# Patient Record
Sex: Male | Born: 1935
Health system: Southern US, Community
[De-identification: ages and names within clinical notes are randomized; demographics above are authoritative.]

## PROBLEM LIST (undated history)

## (undated) DIAGNOSIS — K649 Unspecified hemorrhoids: Secondary | ICD-10-CM

## (undated) DIAGNOSIS — K219 Gastro-esophageal reflux disease without esophagitis: Secondary | ICD-10-CM

## (undated) DIAGNOSIS — K529 Noninfective gastroenteritis and colitis, unspecified: Secondary | ICD-10-CM

## (undated) DIAGNOSIS — I10 Essential (primary) hypertension: Secondary | ICD-10-CM

## (undated) DIAGNOSIS — K6289 Other specified diseases of anus and rectum: Secondary | ICD-10-CM

## (undated) DIAGNOSIS — E785 Hyperlipidemia, unspecified: Secondary | ICD-10-CM

## (undated) DIAGNOSIS — K635 Polyp of colon: Secondary | ICD-10-CM

## (undated) DIAGNOSIS — K5790 Diverticulosis of intestine, part unspecified, without perforation or abscess without bleeding: Secondary | ICD-10-CM

## (undated) DIAGNOSIS — M1990 Unspecified osteoarthritis, unspecified site: Secondary | ICD-10-CM

## (undated) DIAGNOSIS — J45909 Unspecified asthma, uncomplicated: Secondary | ICD-10-CM

## (undated) DIAGNOSIS — Z8719 Personal history of other diseases of the digestive system: Secondary | ICD-10-CM

## (undated) DIAGNOSIS — K922 Gastrointestinal hemorrhage, unspecified: Secondary | ICD-10-CM

## (undated) DIAGNOSIS — K222 Esophageal obstruction: Secondary | ICD-10-CM

## (undated) DIAGNOSIS — Z9889 Other specified postprocedural states: Secondary | ICD-10-CM

## (undated) DIAGNOSIS — I639 Cerebral infarction, unspecified: Secondary | ICD-10-CM

## (undated) DIAGNOSIS — E119 Type 2 diabetes mellitus without complications: Secondary | ICD-10-CM

## (undated) DIAGNOSIS — K449 Diaphragmatic hernia without obstruction or gangrene: Secondary | ICD-10-CM

## (undated) DIAGNOSIS — I63412 Cerebral infarction due to embolism of left middle cerebral artery: Secondary | ICD-10-CM

## (undated) DIAGNOSIS — I5042 Chronic combined systolic (congestive) and diastolic (congestive) heart failure: Secondary | ICD-10-CM

## (undated) HISTORY — PX: SPLENECTOMY, TOTAL: SHX788

## (undated) HISTORY — DX: Noninfective gastroenteritis and colitis, unspecified: K52.9

## (undated) HISTORY — DX: Hyperlipidemia, unspecified: E78.5

## (undated) HISTORY — DX: Gastrointestinal hemorrhage, unspecified: K92.2

## (undated) HISTORY — DX: Type 2 diabetes mellitus without complications: E11.9

## (undated) HISTORY — DX: Unspecified hemorrhoids: K64.9

## (undated) HISTORY — DX: Polyp of colon: K63.5

## (undated) HISTORY — DX: Other specified diseases of anus and rectum: K62.89

## (undated) HISTORY — DX: Personal history of other diseases of the digestive system: Z87.19

## (undated) HISTORY — PX: COLONOSCOPY: SHX174

## (undated) HISTORY — DX: Gastro-esophageal reflux disease without esophagitis: K21.9

## (undated) HISTORY — DX: Esophageal obstruction: K22.2

## (undated) HISTORY — PX: ESOPHAGOGASTRODUODENOSCOPY: SHX1529

## (undated) HISTORY — DX: Diaphragmatic hernia without obstruction or gangrene: K44.9

## (undated) HISTORY — DX: Unspecified asthma, uncomplicated: J45.909

## (undated) HISTORY — DX: Unspecified osteoarthritis, unspecified site: M19.90

## (undated) HISTORY — DX: Diverticulosis of intestine, part unspecified, without perforation or abscess without bleeding: K57.90

## (undated) HISTORY — DX: Cerebral infarction, unspecified: I63.9

## (undated) HISTORY — PX: ABDOMINAL HERNIA REPAIR: SHX539

## (undated) HISTORY — PX: NISSEN FUNDOPLICATION: SHX2091

## (undated) HISTORY — DX: Other specified postprocedural states: Z98.890

## (undated) NOTE — *Deleted (*Deleted)
Patient discharged home with wife, discharge instructions given and explained to patient/wife, they verbalized understanding, patient denies any pain/distress, no pressure injury noted.  accompanied home by wife.

---

## 2001-11-08 ENCOUNTER — Encounter: Admission: RE | Admit: 2001-11-08 | Discharge: 2001-11-08 | Payer: Self-pay | Admitting: Internal Medicine

## 2001-12-22 ENCOUNTER — Ambulatory Visit (HOSPITAL_COMMUNITY): Admission: RE | Admit: 2001-12-22 | Discharge: 2001-12-22 | Payer: Self-pay | Admitting: Gastroenterology

## 2001-12-22 ENCOUNTER — Encounter: Payer: Self-pay | Admitting: Gastroenterology

## 2002-03-03 ENCOUNTER — Ambulatory Visit: Admission: RE | Admit: 2002-03-03 | Discharge: 2002-03-03 | Payer: Self-pay | Admitting: General Surgery

## 2002-03-23 ENCOUNTER — Encounter (INDEPENDENT_AMBULATORY_CARE_PROVIDER_SITE_OTHER): Payer: Self-pay | Admitting: Specialist

## 2002-03-23 ENCOUNTER — Encounter: Payer: Self-pay | Admitting: Anesthesiology

## 2002-03-24 ENCOUNTER — Encounter: Payer: Self-pay | Admitting: General Surgery

## 2002-03-24 ENCOUNTER — Inpatient Hospital Stay (HOSPITAL_COMMUNITY): Admission: RE | Admit: 2002-03-24 | Discharge: 2002-03-26 | Payer: Self-pay | Admitting: General Surgery

## 2003-12-11 ENCOUNTER — Inpatient Hospital Stay (HOSPITAL_COMMUNITY): Admission: EM | Admit: 2003-12-11 | Discharge: 2003-12-12 | Payer: Self-pay | Admitting: Emergency Medicine

## 2003-12-19 ENCOUNTER — Ambulatory Visit: Payer: Self-pay | Admitting: Internal Medicine

## 2004-01-08 ENCOUNTER — Ambulatory Visit: Payer: Self-pay | Admitting: Internal Medicine

## 2004-11-11 ENCOUNTER — Encounter: Admission: RE | Admit: 2004-11-11 | Discharge: 2004-11-11 | Payer: Self-pay | Admitting: Otolaryngology

## 2005-01-05 ENCOUNTER — Encounter: Admission: RE | Admit: 2005-01-05 | Discharge: 2005-01-05 | Payer: Self-pay | Admitting: Otolaryngology

## 2005-06-19 ENCOUNTER — Ambulatory Visit: Payer: Self-pay | Admitting: Internal Medicine

## 2005-06-24 ENCOUNTER — Inpatient Hospital Stay (HOSPITAL_COMMUNITY): Admission: EM | Admit: 2005-06-24 | Discharge: 2005-06-26 | Payer: Self-pay | Admitting: Emergency Medicine

## 2005-06-24 ENCOUNTER — Ambulatory Visit: Payer: Self-pay | Admitting: Internal Medicine

## 2005-06-25 ENCOUNTER — Encounter (INDEPENDENT_AMBULATORY_CARE_PROVIDER_SITE_OTHER): Payer: Self-pay | Admitting: *Deleted

## 2005-06-30 ENCOUNTER — Ambulatory Visit: Payer: Self-pay | Admitting: Gastroenterology

## 2005-07-17 ENCOUNTER — Ambulatory Visit: Payer: Self-pay | Admitting: Internal Medicine

## 2005-07-23 ENCOUNTER — Encounter: Admission: RE | Admit: 2005-07-23 | Discharge: 2005-07-23 | Payer: Self-pay | Admitting: Otolaryngology

## 2005-07-27 ENCOUNTER — Ambulatory Visit: Payer: Self-pay | Admitting: Internal Medicine

## 2005-08-31 ENCOUNTER — Ambulatory Visit: Payer: Self-pay | Admitting: Internal Medicine

## 2005-09-16 ENCOUNTER — Ambulatory Visit: Payer: Self-pay | Admitting: Internal Medicine

## 2006-01-21 ENCOUNTER — Encounter: Payer: Self-pay | Admitting: Vascular Surgery

## 2006-01-21 ENCOUNTER — Inpatient Hospital Stay (HOSPITAL_COMMUNITY): Admission: EM | Admit: 2006-01-21 | Discharge: 2006-01-22 | Payer: Self-pay | Admitting: Emergency Medicine

## 2006-02-01 ENCOUNTER — Ambulatory Visit (HOSPITAL_COMMUNITY): Admission: RE | Admit: 2006-02-01 | Discharge: 2006-02-01 | Payer: Self-pay | Admitting: Interventional Cardiology

## 2006-02-01 ENCOUNTER — Encounter (INDEPENDENT_AMBULATORY_CARE_PROVIDER_SITE_OTHER): Payer: Self-pay | Admitting: Interventional Cardiology

## 2006-07-19 ENCOUNTER — Encounter: Admission: RE | Admit: 2006-07-19 | Discharge: 2006-07-19 | Payer: Self-pay | Admitting: Otolaryngology

## 2006-09-21 ENCOUNTER — Ambulatory Visit (HOSPITAL_BASED_OUTPATIENT_CLINIC_OR_DEPARTMENT_OTHER): Admission: RE | Admit: 2006-09-21 | Discharge: 2006-09-21 | Payer: Self-pay | Admitting: Otolaryngology

## 2006-09-21 ENCOUNTER — Encounter (INDEPENDENT_AMBULATORY_CARE_PROVIDER_SITE_OTHER): Payer: Self-pay | Admitting: Otolaryngology

## 2006-10-12 ENCOUNTER — Ambulatory Visit (HOSPITAL_COMMUNITY): Admission: RE | Admit: 2006-10-12 | Discharge: 2006-10-12 | Payer: Self-pay | Admitting: Internal Medicine

## 2006-12-08 ENCOUNTER — Encounter: Admission: RE | Admit: 2006-12-08 | Discharge: 2006-12-08 | Payer: Self-pay | Admitting: Internal Medicine

## 2007-01-20 ENCOUNTER — Ambulatory Visit (HOSPITAL_COMMUNITY): Admission: RE | Admit: 2007-01-20 | Discharge: 2007-01-22 | Payer: Self-pay | Admitting: General Surgery

## 2007-06-24 ENCOUNTER — Encounter: Payer: Self-pay | Admitting: Gastroenterology

## 2007-06-24 ENCOUNTER — Ambulatory Visit: Payer: Self-pay | Admitting: Gastroenterology

## 2007-06-24 DIAGNOSIS — Z860100 Personal history of colon polyps, unspecified: Secondary | ICD-10-CM | POA: Insufficient documentation

## 2007-06-24 DIAGNOSIS — K5731 Diverticulosis of large intestine without perforation or abscess with bleeding: Secondary | ICD-10-CM | POA: Insufficient documentation

## 2007-06-24 DIAGNOSIS — Z8679 Personal history of other diseases of the circulatory system: Secondary | ICD-10-CM | POA: Insufficient documentation

## 2007-06-24 DIAGNOSIS — E785 Hyperlipidemia, unspecified: Secondary | ICD-10-CM | POA: Insufficient documentation

## 2007-08-08 ENCOUNTER — Ambulatory Visit: Payer: Self-pay | Admitting: Gastroenterology

## 2007-10-07 ENCOUNTER — Encounter: Admission: RE | Admit: 2007-10-07 | Discharge: 2007-10-07 | Payer: Self-pay | Admitting: General Surgery

## 2007-10-11 ENCOUNTER — Encounter: Payer: Self-pay | Admitting: Gastroenterology

## 2007-10-11 ENCOUNTER — Telehealth: Payer: Self-pay | Admitting: Gastroenterology

## 2007-10-11 ENCOUNTER — Encounter (INDEPENDENT_AMBULATORY_CARE_PROVIDER_SITE_OTHER): Payer: Self-pay | Admitting: *Deleted

## 2007-10-11 DIAGNOSIS — K222 Esophageal obstruction: Secondary | ICD-10-CM | POA: Insufficient documentation

## 2007-11-03 ENCOUNTER — Encounter: Payer: Self-pay | Admitting: Gastroenterology

## 2007-11-03 ENCOUNTER — Ambulatory Visit (HOSPITAL_COMMUNITY): Admission: RE | Admit: 2007-11-03 | Discharge: 2007-11-03 | Payer: Self-pay | Admitting: Gastroenterology

## 2010-06-26 ENCOUNTER — Other Ambulatory Visit: Payer: Self-pay | Admitting: Dermatology

## 2010-07-01 NOTE — Op Note (Signed)
NAMELIONELL, MATUSZAK                 ACCOUNT NO.:  [id number]   MEDICAL RECORD NO.:  [id number]          PATIENT TYPE:  AMB   LOCATION:  DSC                          FACILITY:  [facility]   PHYSICIAN:  Christopher E. Ezzard Standing, M.D.DATE OF BIRTH:  1931/08/20   DATE OF PROCEDURE:  09/21/2006  DATE OF DISCHARGE:                               OPERATIVE REPORT   PREOPERATIVE DIAGNOSIS:  Chronic sinonasal disease with sinonasal  polyps.   POSTOPERATIVE DIAGNOSES:  1. Chronic sinonasal disease with sinonasal polyps.  2. Left inferior turbinate hypertrophy.   OPERATION PERFORMED:  1. Functional endoscopic sinus surgery with bilateral total      ethmoidectomy with removal of sinonasal polyps, bilateral maxillary      ostial enlargement.  2. Left inferior turbinate reduction.   SURGEON:  Kristine Garbe. Ezzard Standing, M.D.   ANESTHESIA:  General endotracheal.   COMPLICATIONS:  None.   BRIEF CLINICAL NOTE:  [name] is a 77 year old gentleman who has had  problem with a chronic cough.  He had chronic sinus disease identified  on the MRI scan as well as CT scan, despite several rounds of  antibiotics.  On exam in the office, he has edematous mucosa and some  very small early polyps in the middle meatus noted.  His main symptoms  of sinus problems is some nasal congestion as well as a chronic cough  and postnasal drainage.  He gets temporary relief when he is on  antibiotics and steroids, but keeps having recurrent problems and repeat  CT scan shows a chronic blockage of the sinuses.  He is taken to the  operating room at this time for functional endoscopic sinus surgery.   DESCRIPTION OF PROCEDURE:  After adequate endotracheal anesthesia, the  nose was prepped with cotton pledgets soaked in decongestant and the  middle meatus area was injected with Xylocaine with epinephrine for  local hemostasis control.  Of note, the patient had a very large left  inferior turbinate.  At completion of the sinus  procedure, a left  inferior turbinate reduction was performed.  First the right side was  approached.  On exam with the 0 endoscope, [name] had several polyps  within the middle meatus as well as in the posterior ethmoid area.  The  uncinate process was incised with a sickle knife.  The anterior and  posterior ethmoid cells were opened up with straight-through cut and up-  through cup forceps as well as use of the microdebrider to remove some  of the thickened polypoid mucosa.  Next, the maxillary ostia was  identified and this was enlarged with straight-through cup and bite-  cutting forceps.  There was some hypertrophy and early polypoid changes  of the mucosa within the maxillary sinus but minimal disease in the  right maxillary sinus.  After opening up the anterior and posterior  ethmoid areas, the right side was completed.  Packs were placed for  hemostasis and then the left side was approached.  Again, using the 0  and 33 endoscopes, the nose and sinus area was evaluated.  There were  several polyps arising  from the middle meatus which were small.  The  uncinate process was incised with a sickle knife, anterior and posterior  ethmoid cells were opened up with straight-through cup forceps and a  microdebrider.  The maxillary ostia was identified and enlarged with  backbiting and straight-through cup forceps.  Of note, on the left  maxillary sinus, [name] had a large amount of very thick glue-like mucus  in the left maxillary sinus which was cleaned and aspirated out.  This  completed the left side.  Because of the size of the left inferior  turbinate, left inferior turbinate reduction was performed.  An incision  was made along the inferior edge of the turbinate, mucosa was elevated  off the turbinate bone and turbinate bone was removed submucosal with  cauterization of the turbinate was performed and the remaining turbinate  was outfractured.  This completed the procedure.  Kennedy  sinus packs  were placed within the middle meatus bilaterally and hydrated with  Xylocaine with epinephrine.  The turbinates were then injected with 30  mg of Depo-Medrol bilaterally.  This completed the procedure.  [name]  was awoke from anesthesia and transferred to the recovery room postop  doing well.   DISPOSITION:  [name] will be discharged home later this morning on  Keflex 500 mg b.i.d. for 10 days and Tylenol and Vicodin p.r.n. pain.  He will restart his aspirin and Plavix in two days will follow up in my  office in three days to have his [name] sinus packs removed.           ______________________________  Kristine Garbe Ezzard Standing, M.D.     CEN/MEDQ  D:  09/21/2006  T:  09/21/2006  Job:  [id number]   cc:   Theressa Millard, M.D.

## 2010-07-01 NOTE — Op Note (Signed)
NAMETYREK, LAWHORN                 ACCOUNT NO.:  [id number]   MEDICAL RECORD NO.:  [id number]          PATIENT TYPE:  OIB   LOCATION:  2550                         FACILITY:  [facility]   PHYSICIAN:  Cherylynn Ridges, M.D.    DATE OF BIRTH:  1931/12/20   DATE OF PROCEDURE:  01/20/2007  DATE OF DISCHARGE:                               OPERATIVE REPORT   PREOPERATIVE DIAGNOSIS:  Ventral hernia status post splenectomy.   POSTOPERATIVE DIAGNOSIS:  Ventral hernia status post splenectomy.   PROCEDURE:  Laparoscopic ventral hernia repair with 20 x 15 piece of  Proceed mesh.   SURGEON:  Cherylynn Ridges, M.D.   ASSISTANT:  Ollen Gross. Vernell Morgans, M.D.   ANESTHESIA:  General endotracheal.   ESTIMATED BLOOD LOSS:  Less than 20 mL.   COMPLICATIONS:  None.   CONDITION:  Stable.   INDICATION FOR OPERATION:  The patient is a 77 year old who underwent  splenectomy and exploratory laparotomy who developed a ventral hernia,  who now comes in for repair.   FINDINGS:  The patient had omental and small bowel adhesions to the  midline ventral hernia in the upper portion of his previous incision.  This was easily dissected away and there was no evidence of any bowel  entrapment.   OPERATION:  The patient was taken to the operating room, placed on the  table in the supine position.  After an adequate general endotracheal  anesthetic was administered, he was prepped and draped in the usual  sterile manner exposing the midline of the abdomen.   A Hasson cannula was inserted through the left lower quadrant of the  patient's abdomen using [employer] retractors, making incisions in the  rectus sheath and dissecting down to the peritoneum.  A pursestring  suture of 0 Vicryl was placed and the Hasson cannula was passed.  We  were able to insufflate carbon dioxide gas through the cannula into the  peritoneal cavity exposing the large midline ventral hernia which had  part omentum and small bowel contents entrapped  within.   A right upper quadrant 10/11-mm cannula and a right lower quadrant 5-mm  cannula and in the left upper quadrant a 5-mm cannula were all passed  under direct vision.  We then spent time using various instruments  including [place] retractors, laparoscopic scissors and cautery and  also Endoclips to dissect out the contents of the hernia defect.  This  carefully with clips being applied liberally to the omentum in order to  avoid bleeding as the patient is on Plavix chronically because of  chronic recurrent TIAs.  We were able to successfully find the edges of  the hernia sac and then subsequently mark these edges and measured out 5  cm from that, measuring about 15 cm circumferentially around it.  We  decided to use a piece of 15 x 20 cm Proceed mesh, which was soaked in  antibiotic solution.  Tacking sutures or stay sutures were placed on the  mesh with the rough side superiorly and the mesh marked for appropriate  orientation.  We were able to pass  it through the cannula in the right  upper quadrant and then subsequently, retrieve the stay sutures using a  suture retriever through four incisions made around the hernia defect.  Once we pulled these sutures up, we tied them down.  This approximated  the mesh up towards the anterior abdominal wall.  There was a small  amount of laxity in the mesh itself, however, it appeared to be somewhat  taut.  There was gas protruding out through the hernia itself but this  is not surprising.  We subsequently tacked it down with tacking sutures  circumferentially making sure that all edges were covered.  This was  done appropriately and once we had it done, we obtained hemostasis using  clips and electrocautery.  There was minimal bleeding.  There was still  some protrusion of the abdominal wall because of gas in the sac,  however, we had the entire hernia defect itself covered with mesh under  appropriate tension.  All counts were correct.   We aspirated all fluid  and gas from above the liver as we did irrigate somewhat and then we  removed all cannulas.   The left lower quadrant and right upper quadrant incision sites were  closed using running subcuticular stitch of 4-0 Vicryl.  The fascia in  the left lower quadrant was closed using the stay suture and also a  running 0 Vicryl.  Marcaine 0.25% was injected at all sites.  We closed  most of the cannula sites and small sutures sites using Dermabond and  then we placed Dermabond upon the repaired sites with a 4-0 Vicryl.  Sterile dressings were applied including Tegaderm and Steri-Strips.      Cherylynn Ridges, M.D.  Electronically Signed     JOW/MEDQ  D:  01/20/2007  T:  01/20/2007  Job:  [id number]   cc:   Theressa Millard, M.D.

## 2010-07-04 NOTE — H&P (Signed)
NAMEKACIN, DANCY NO.:  [id number]   MEDICAL RECORD NO.:  [id number]          PATIENT TYPE:  INP   LOCATION:  2006                         FACILITY:  [facility]   PHYSICIAN:  Elmore Guise., M.D.DATE OF BIRTH:  10-15-1931   DATE OF ADMISSION:  12/11/2003  DATE OF DISCHARGE:                                HISTORY & PHYSICAL   PRIMARY CARE PHYSICIAN:  Dr. Janey Greaser.   REASON FOR ADMISSION:  Chest pain.   HISTORY OF PRESENT ILLNESS:  The patient is a very pleasant 77 year old  white male with past medical history of dyslipidemia who presents to the ER  with a chief complaint of chest tightness. The patient reports normal state  of health until approximately two days ago. Since that time he reported  increasing nonproductive cough as well as intermittent chest pain. The  patient reports it is in the substernal area, occasional pressure feeling,  no exertional component. The patient does still remain active. He can walk  miles at a time with no significant limitations. Denies any prior chest  pressure or pain. No palpitations. No dyspnea on exertion. No orthopnea, no  PND. No significant lower extremity edema.   REVIEW OF SYSTEMS:  Positive for head cold for the last six weeks and  recent nonproductive cough. The patient does report pain as positional and  that it worsens when he lies flat and improves on leaning forward.   CURRENT MEDICATIONS:  Zocor 40 mg a day and an aspirin.   ALLERGIES:  None.   FAMILY HISTORY:  No significant coronary artery disease.   SOCIAL HISTORY:  He is married.  No tobacco. Occasional alcohol with a glass  of either daily or every other day.   PHYSICAL EXAMINATION:  VITAL SIGNS: Blood pressure 129/65, pulse 80 to 90,  he is afebrile with a temperature of 98.3. He is saturating at 95% on room  air.  GENERAL: He is alert and oriented times four and in no acute distress.  NECK: Supple with no lymphadenopathy. 2+ carotids. No  JVD.  LUNGS: Clear.  HEART: Regular with a normal S1 and S2. A very soft 2/6 systolic ejection  murmur at the left lower sternal border.  ABDOMEN: Soft, nontender, and nondistended.  EXTREMITIES: Warm with 2+ pulses and no edema.   Lab work shows white count of 13.4, hemoglobin 16.2, platelet count 194,000.  He has got 86% neutrophils, 6% lymphs, some mild left shift.  Coags are  normal with a PT of 13.1, INR 1.0, and PTT of 29.  BUN and creatinine are 11  and 0.9.  Electrolytes within normal limits. LFTs all normal.  CK is 52, MB  is 1.3, troponin-I is negative.   Chest x-ray shows no acute cardiopulmonary disease. ECG shows normal sinus  rhythm, no significant ST-T wave changes noted.   IMPRESSION:  1.  Chest pain.  2.  History of dyslipidemia.   PLAN:  1.  From a chest pain standpoint, will admit the patient for observation.      Will continue serial cardiac enzymes. Will give patient one  shot of      Lovenox today.  Continue aspirin daily. I have encouraged him to get up      and walk around.  Should he not have any further exertional chest pain,      will likely discharge in the morning with outpatient stress test.      However, ECG is unremarkable for pericarditis. I will check an echo to      evaluate LV function as well as for any significant pericardial      effusion.  2.  Patient with mildly elevated white count. Suspect this is secondary to      recent upper respiratory tract infection with possible sinusitis. Will      hold on antibiotics unless the patient becomes febrile.       TWK/MEDQ  D:  12/11/2003  T:  12/11/2003  Job:  [id number]

## 2010-07-04 NOTE — Assessment & Plan Note (Signed)
[facility]                               PULMONARY OFFICE NOTE   Grant, [name]                        MRN:          [id number]  DATE:09/16/2005                            DOB:          14-Oct-1931    HISTORY OF PRESENT ILLNESS:  A 76 year old, white male, never a smoker with  PFTs performed on a bad day on August 31, 2005, which were completely normal  suggesting to me an upper airway problem and has apparently responded to  treatment with regular reflux and rhinitis and comes back here for further  recommendations.  Note, he is doing much better since Advair was stopped.   PHYSICAL EXAMINATION:  GENERAL:  He is a pleasant, ambulatory, white male in  no acute distress.  VITAL SIGNS:  Stable.  HEENT:  Unremarkable.  Pharynx clear.  LUNGS:  Perfectly clear bilaterally to auscultation and percussion.  HEART:  Regular rate and rhythm without murmurs, rubs or gallops.  ABDOMEN:  Soft, benign.  EXTREMITIES:  Without calf tenderness, cyanosis or clubbing.   IMPRESSION:  Complete reverse of all the symptoms with treatment directed at  rhinitis and reflux.  The issue is whether there is any asthma present at  all or whether he had pseudoasthma.  To sort through this, I recommended  trying to stop the QVAR first and then after a week or two, he could reduce  proton pump inhibitor therapy down to one daily (the reverse of a  therapeutic trial, if you will).   PLAN:  Pulmonary followup can be p.r.n.  The patient should maintain  treatment directed at rhinitis based on CT scan and follow up with Dr. Narda Bonds for treatment of sinusitis.  However, pulmonary followup is p.r.n.                                   Charlaine Dalton. Sherene Sires, MD, [facility]   MBW/MedQ  DD:  09/17/2005  DT:  09/17/2005  Job #:  [id number]   cc:   Al Decant. Janey Greaser, MD  Kristine Garbe Ezzard Standing, MD

## 2010-07-04 NOTE — Discharge Summary (Signed)
   NAME:  [name], [name]                           ACCOUNT NO.:  [id number]   MEDICAL RECORD NO.:  [id number]                   PATIENT TYPE:  INP   LOCATION:  0349                                 FACILITY:  [facility]   PHYSICIAN:  Adolph Pollack, M.D.            DATE OF BIRTH:  September 16, 1931   DATE OF ADMISSION:  03/23/2002  DATE OF DISCHARGE:  03/26/2002                                 DISCHARGE SUMMARY   PRINCIPAL DISCHARGE DIAGNOSIS:  Paraesophageal hernia.   SECONDARY DIAGNOSES:  1. Gastroesophageal reflux.  2. Hypercholesterolemia.  3. Arthritis.   PROCEDURE:  Laparoscopic paraesophageal hernia repair and Nissen  fundoplication 03/23/02.   REASON FOR ADMISSION:  Mr. Seidman is a 77 year old male with dysphagia, who  was found to have a paraesophageal.  He also had some gastroesophageal  reflux.  He was admitted electively for laparoscopic repair.   HOSPITAL COURSE:  He underwent the above procedure.  The paraesophageal  hernia sac was stuck to the left pleura, so there was a small left pleural  hole, and he had a small left pneumothorax that essentially resolved without  further intervention.  He had a Gastrografin swallow on his first  postoperative day that showed no leak, and a diet was started.  He began  tolerating his diet, was afebrile, and by the third postoperative day, he  was ready to be discharged.   DISPOSITION:  Discharged to home, 03/26/02.  He was given discharge  instructions and told to have liquids and blenderized foods only and not to  overeat.   ACTIVITY:  1. No lifting over 5 pounds.  2. No driving for five days.    DISCHARGE MEDICATIONS:  1. He was given a prescription for Tylox for pain.  2. He was told to stop his Prilosec but to continue on his other home     medicines and take Gas-X for bloating.   FOLLOW UP:  He will see me back in the office in 2-3 weeks.                                               Adolph Pollack, M.D.    Kari Baars  D:  04/06/2002  T:  04/06/2002  Job:  [id number]   cc:   Al Decant. Janey Greaser, M.D.  [address]  [place]  [place] [address]  Fax: [phone]

## 2010-07-04 NOTE — Discharge Summary (Signed)
NAMELUCY, WOOLEVER NO.:  [id number]   MEDICAL RECORD NO.:  [id number]          PATIENT TYPE:  INP   LOCATION:  2006                         FACILITY:  [facility]   PHYSICIAN:  Elmore Guise., M.D.DATE OF BIRTH:  [birth year]   DATE OF ADMISSION:  12/11/2003  DATE OF DISCHARGE:  12/12/2003                                 DISCHARGE SUMMARY   DISCHARGE DIAGNOSES:  1.  Pericarditic chest pain.  2.  History of dyslipidemia.   HISTORY OF PRESENT ILLNESS:  The patient is a very pleasant 77 year old  white male who presented to his primary care physician's office complaining  of increasing retrosternal chest pain.  The patient was sent to the [facility] emergency department for further evaluation.   HOSPITAL COURSE:  On arrival to [facility] ER, the patient was promptly  seen.  He reports increasing chest pain, primarily substernal, worse with  lying flat, and improved with sitting forward and positional changes.  He  was given sublingual nitroglycerin with no significant changes.  His chest  pain resolved spontaneously.  Initial cardiac enzymes were negative.   The patient was admitted for observation. He continued to be chest pain free  for approximately four hours.  He had recurrence of his chest pain which  totally resolved after intravenous Toradol.  His enzymes remained negative  with troponins of 0.01 x3.  ECGs remained negative.  He did have significant  improvement with nonsteroidals.  His blood work on discharge showed a  cholesterol level of 185, triglycerides of 241, HDL of 44, LDL of 93, and  VLDL of 48.   He will be discharged home today to continue the following medications:  1.  Zocor 40 mg daily.  2.  Aspirin 81 mg daily.  3.  Ibuprofen or Advil 400 to 600 mg q.8h. for three days, and then on an as-      needed basis.   PAIN MANAGEMENT:  As listed above.   DISCHARGE ACTIVITY:  As tolerated.   DISCHARGE DIET:  Low fat, low cholesterol.   FOLLOWUP:  At [facility] in two to three days for routine  outpatient stress test.  The patient will be notified from the office for  scheduling.  I have asked him to notify me directly should he have any  further chest pain.  However, he has now been ambulatory with no exertional  component and with his symptoms being atypical, we have decided that  outpatient followup is best.       TWK/MEDQ  D:  12/12/2003  T:  12/12/2003  Job:  [id number]

## 2010-07-04 NOTE — H&P (Signed)
NAMEJOVAN, [name] NO.:  [id number]   MEDICAL RECORD NO.:  [id number]          PATIENT TYPE:  INP   LOCATION:  4710                         FACILITY:  [facility]   PHYSICIAN:  Theressa Millard, M.D.    DATE OF BIRTH:  11-10-1931   DATE OF ADMISSION:  01/21/2006  DATE OF DISCHARGE:  01/22/2006                              HISTORY & PHYSICAL   HISTORY OF PRESENT ILLNESS:  [name] is a very nice 77 year old white  male admitted with TIA versus stroke.   He awoke this morning with slurred speech with a clumsy right hand and a  right facial droop.  He may have had a little bit of numbness in the  hand.  He had no leg symptoms.  He came to the emergency department and  over the last several hours, all his symptoms have resolved.  A CT scan  of the head has already been done and is negative.  Of note is the fact  that he has had a URI in the past 24 hours with a temp to 99.2, some  hoarseness and sore throat.  He is taking some over-the-counter cold  remedies.   PAST MEDICAL HISTORY:  Surgical:  Nissan fundoplication, splenectomy for  ruptured spleen.   Medical:  Tremor and in the office last week, noted to have some  cogwheeling.  Low HDL cholesterol, borderline blood sugar.   MEDICATIONS:  1. Seroquel 40 mg daily.  2. Aspirin 81 mg occasionally.   ALLERGIES:  NO KNOWN DRUG ALLERGIES.   SOCIAL HISTORY:  He is retired from [employer]  He does not smoke.  He drinks one to two glasses of wine daily.   FAMILY HISTORY:  Noncontributory.   REVIEW OF SYSTEMS:  All other systems are negative.   PHYSICAL EXAMINATION:  GENERAL:  Well-developed, well-nourished, in no  acute distress.  VITAL SIGNS:  Blood pressure 142/75.  Pulse of 98.  Respiratory of 20.  O2 saturation 98%.  EYES:  Have pupils which are round and reactive to light.  Extraocular  movements are intact.  EARS, NOSE AND THROAT:  Are unremarkable.  NECK:  Supple.  Thyroid is not enlarged or tender.  CHEST:  Clear to auscultation and percussion.  CARDIAC EXAM:  Has a normal S1 and S2.  There is no S3, S4, murmur, rub  or click.  ABDOMEN:  Soft and nontender.  NEUROLOGICALLY:  Alert and oriented times three.  Cranial nerves are  intact except for a mild rightward deviation of the tongue.  Motor is  5/5.  Sensation intact to light touch.  Toes are downgoing.   LABORATORY DATA:  Chest x-ray and EKG are pending.   CT brain negative.  Carotid Doppler is negative.   IMPRESSION:  1. Transient ischemic attack versus stroke.  This seems to be more      likely to be subcortical secondary to the paucity of sensory      symptoms.  There is some tongue deviation present.  We will obtain      an MRI, MRA and a TTE.  2.  Hypercholesterolemia.  3. Tremor.      Theressa Millard, M.D.  Electronically Signed     JO/MEDQ  D:  01/21/2006  T:  01/22/2006  Job:  [id number]

## 2010-07-04 NOTE — H&P (Signed)
[name], HUR NO.:  [id number]   MEDICAL RECORD NO.:  [id number]          PATIENT TYPE:  INP   LOCATION:  3734                         FACILITY:  [facility]   PHYSICIAN:  Rachael Fee, M.D. DATE OF BIRTH:  1932-01-20   DATE OF ADMISSION:  06/24/2005  DATE OF DISCHARGE:  06/26/2005                                HISTORY & PHYSICAL   NOTE:  I just completed a dictation of a history and physical on the patient  [name], record numbers [id number] and number of that dictation started  off 337, by do not know with the last digits of the transcription number  are, anyway it was for his history and physical dated 06/24/2005, was the  day of admission, I dictated it today Jun 26, 2005.   I forgot to sign off so if you could, put at the end of that dictation that  it was Jennye Moccasin, P.A., dictating for Dr. Rob Bunting, for that  history and physical on [name], record number [id number], dated  06/24/2005.      Jennye Moccasin, P.A. [facility]    ______________________________  Rachael Fee, M.D.    SG/MEDQ  D:  06/26/2005  T:  06/26/2005  Job:  [id number]

## 2010-07-04 NOTE — Op Note (Signed)
NAME:  [name], [name]                           ACCOUNT NO.:  [id number]   MEDICAL RECORD NO.:  [id number]                   PATIENT TYPE:  OUT   LOCATION:  [facility]                                 FACILITY:  [facility]   PHYSICIAN:  Adolph Pollack, M.D.            DATE OF BIRTH:  12-16-1931   DATE OF PROCEDURE:  03/23/2002  DATE OF DISCHARGE:  03/03/2002                                 OPERATIVE REPORT   PREOPERATIVE DIAGNOSES:  Paraesophageal hernia with gastroesophageal reflux.   POSTOPERATIVE DIAGNOSES:  Paraesophageal hernia with gastroesophageal  reflux.   PROCEDURE:  Laparoscopic repair of paraesophageal hiatal hernia and Nissen  fundoplication.   SURGEON:  Adolph Pollack, M.D.   SECOND ASSISTANT:  Abigail Miyamoto, M.D.   ANESTHESIA:  General.   INDICATIONS FOR PROCEDURE:  Mr. Decaire is a 77 year old male who had some  dysphagia and heartburn. Evaluation was performed by Dr. Corinda Gubler  demonstrating a large paraesophageal hiatal hernia. This was noted on upper  endoscopy as well as a swallowing study. There was also reflux noted on his  upper GI series. He is here for repair. The procedure and the risks were  explained to him preoperatively.   TECHNIQUE:  He was seen in the holding room then brought to the operating  room, placed supine on the operating table and a general anesthetic was  administered. A Foley catheter was placed in the bladder. His abdomen was  sterilely prepped and draped. At a point superior to the umbilicus, local  anesthetic was infiltrated and a small incision was made incising the skin  and subcutaneous tissue. The midline fascia was identified, a small incision  made in the midline fascia. The peritoneum was identified and then the  peritoneal cavity was entered under direct vision. A pursestring suture of  #0 Vicryl was placed around the fascial edges. A Hasson trocar was  introduced into the peritoneal cavity and a pneumoperitoneum was created  by  insufflation of CO2 gas.   A 30 degree laparoscope was introduced. A 5 mm trocar was placed in the  right mid lateral abdomen through which a small  liver retractor was  inserted. The left lobe of the liver was retracted anteriorly and up toward  the right shoulder exposing the hiatal hernia with approximately 1/3 of the  stomach being up into the chest. Under direct vision, 5 mm trocars were  placed in the right and left subcostal positions and an 11 mm trocar was  placed in the epigastric position. The stomach was grasped and reduced back  into the abdominal cavity and then released and it rebounded back into the  mediastinum. I incised the thin aspect of the gastrohepatic omentum up to  the right crus and noted a large amount of sac anterior to the esophagus  which I incised and removed some of it. I then approached the left crus and  using careful blunt  dissection and harmonic scalpel mobilized the sac which  was stuck to the pleura. In mobilizing the sac, a very small hole was made  in the pleura. However, the patient was able to tolerate this the duration  of the operation with no hemodynamic compromise and normal oxygen  saturations. I then dissected the sac free from the left crus and excised  some of it. Following this, I was able to dissect more sac free from the  right crus and excised some of it. Then using careful blunt dissection, I  was able to create a retroesophageal window. I then placed a Penrose drain  through the window and was able to retract the esophagus and stomach. I  noticed the attachments of the sac to the stomach and was able to incise  these with the harmonic scalpel and excised part of the sac. I then grasped  the mid fundus and mobilized it by dividing the short gastric vessels all  the way around the angle of His to the cardia. I continued to mobilize the  stomach free from the hernia sac attachments to the left crus, right crus  and posteriorly  excising some sac and using harmonic scalpel. I had the  esophagus well defined and identified the anterior and posterior vagus  nerves which were preserved. Once I completely mobilized the stomach and  excised the sac, I made sure I had an adequate length of intraabdominal  esophagus. The stomach was released and stayed in the abdominal cavity. A  large hiatal defect was noted.   I repaired the hiatal defect with interrupted #0 nonabsorbable sutures. I  felt that I could use one more suture but wanted to first perform the wrap.  I made sure the stomach was in the appropriate orientation and then passed  the fundus through the retroesophageal window and I was able to create the  left leaf of the wrap. The shoe shine maneuver was performed. Following  this, a size 50 lighted bougie was passed through the esophagus and into the  stomach. I then performed a 360 degree fundoplication with three sutures  measuring approximately 2 cm. The sutures were size #0 nonabsorbable, two of  the sutures incorporated the esophagus with the left and right leaf, the  other one did not. Clips were placed on all three of these sutures.   I had the bougie removed and noted it to be intact. The wrap was floppy with  the sutures lying at approximately the 11 o'clock position. No bleeding was  noted. I then reexamined the hiatus and added one more size zero suture to  close the hiatus. I then inspected the entire area and noted no bleeding. At  this point, I placed the suction at the apex of the newly created hiatus and  began suctioning out CO2 gas. Immediately we could see his title volumes  improve and then title CO2 dropping. I then removed the liver retractor and  then began removing trocars and releasing the pneumoperitoneum. Once all  trocars were removed, I closed up the supraumbilical fascial defect by tightening up and tying down the pursestring suture. The skin incisions were  then closed with 4-0  Monocryl subcuticular stitches. Steri-Strips and  sterile dressings were applied.   The patient tolerated the procedure well without any apparent complications.  He was extubated and taken to the recovery room in satisfactory condition.  Portal chest x-ray is pending at this time. The plan will be to keep him on  supplemental oxygen. Also order a Gastrografin swallow study on him in the  morning.                                               Adolph Pollack, M.D.    Kari Baars  D:  03/23/2002  T:  03/23/2002  Job:  [id number]   cc:   Ulyess Mort, M.D. [facility]   Al Decant. Janey Greaser, M.D.  [address]  [place]  [place] [address]  Fax: [phone]   Charlaine Dalton. Sherene Sires, M.D. [facility]  [address]  [place]  [place] [address]  Fax: [phone]

## 2010-07-04 NOTE — Assessment & Plan Note (Signed)
[facility]                               PULMONARY OFFICE NOTE   SHADI, SESSLER                        MRN:          [id number]  DATE:08/31/2005                            DOB:          [birth year]    PULMONARY/EXTENDED FOLLOWUP OFFICE VISIT   HISTORY:  A 77 year old white male, never smoker, with previous difficult to  control coughing, wheezing, and dyspnea in 2002.  Much better after Nissen  fundoplication but much worse over the last year and a half with upper  airway coughing paroxysms that have been attributed to rhinitis/sinusitis  and never back to baseline in terms of dyspnea with exertion.  He returns  today, as requested, for PFTs, having been evaluated by Dr. Narda Bonds,  who felt that the sinusitis had been eradicated adequately (see study, see  visit, July 27, 2005) with a recommendation that he continue nasal steroids  and noting that the sinus disease appeared relatively mild compared to his  pulmonary disease and noting that there was no significant obvious drainage.  Since that evaluation, the patient had done actually quite a bit better  until five days ago with the onset of sore throat, hoarseness, and sensation  of excessive postnasal drainage but note even now, he does not have any  purulent secretions.  Despite my recommendations about how to increase the  PPI component of his problem and his nasal steroids, the patient has  activated neither of these contingency plans and comes in today with  frequent throat-clearing, inability to sleep due to the sensation of  drainage.   For full inventory of medications, please see face sheet, dated August 31, 2005.   PHYSICAL EXAMINATION:  VITAL SIGNS:  Normal.  GENERAL:  He is a somewhat depressed, ambulatory, anxious white male in no  acute distress.  HEENT:  Minimal turbinate.  Oropharynx is clear.  There is minimal erythema.  There is no cervical adenopathy, tinnitus, or  excessive postnasal drainage.  NECK:  Supple without cervical adenopathy or tenderness.  Trachea is  midline.  No megaly.  LUNGS:  Lung fields are perfectly clear bilaterally to auscultation and  percussion.  HEART:  Regular rate and rhythm without murmur, rub or gallop.  ABDOMEN:  Soft, benign.  EXTREMITIES:  Without calf tenderness, clubbing, cyanosis or edema.   PFTs were performed today on a bad day symptomatically and are completely  normal.  Thus, his pulmonary disease is essentially nonexistent at  present, despite exacerbation of his cough and upper airway coughing and  unexplained dyspnea, which again probably is related to the upper airway.  PFTs were reviewed with the patient in detail, dated today, and are  essentially normal.   IMPRESSION:  Chronic cough with excessive throat clearing in the setting of  having a recent sinusitis that cleared by the time he saw Dr. Ezzard Standing but is  now exacerbating again.  This time, he has no obvious purulent secretions.  Most of his symptoms are upper airway in nature.  The difficult diagnosis  includes poorly controlled rhinitis with flare (note that this  possible,  especially since the patient misunderstood his nasal steroid  recommendation), reflux with flare (note that this is possible because the  patient disregarded previous contingency planning), or adverse effect from  Advair (which is certainly always in the differential, especially since his  PFTs are normal).  I therefore recommend the following approach:   RECOMMENDATIONS:  1.  First, I spent extra time, again going over the treatment of chronic      rhinitis, including the need to increase nasal steroids to two puffs      b.i.d. for any flare up of symptoms that could be related to sinus      disease or rhinitis (these were reviewed with him in writing).      Specifically, should increase the Flonase to 2 puffs b.i.d., perhaps      using Afrin for the first five days if  needed to control acute symptoms      and then reduce gradually the dose down to nightly-only fluticasone.  2.  For cough, to use Mucinex DM 2 b.i.d., plus Advil Cold & Sinus for nasal      congestion symptoms.  3.  Increase PPI therapy to b.i.d. until all of his upper airway symptoms      resolved, then back down to once daily.  4.  Try to resist the urge to clear his throat, which he did almost      continuously during the interview and exam today, by using sugarless      candy, not medicated lozenges.  5.  Eliminate Advair in favor of  QVAR 42 puffs b.i.d.  I spent extra time      coaching him on MDI technique, which he only mastered about 50% of      effectiveness and will need to continue to work on that on each visit.   Followup will be in three weeks.                                   Charlaine Dalton. Sherene Sires, MD, FCCP   MBW/MedQ  DD:  08/31/2005  DT:  08/31/2005  Job #:  [id number]   cc:   Kristine Garbe. Ezzard Standing, MD  Al Decant. Janey Greaser, MD

## 2010-07-04 NOTE — H&P (Signed)
NAMEDRAKKAR, MEDEIROS NO.:  [id number]   MEDICAL RECORD NO.:  [id number]          PATIENT TYPE:  INP   LOCATION:  3734                         FACILITY:  [facility]   PHYSICIAN:  Rachael Fee, M.D. DATE OF BIRTH:  27-Jun-1931   DATE OF ADMISSION:  06/24/2005  DATE OF DISCHARGE:                                HISTORY & PHYSICAL   CHIEF COMPLAINT:  Rectal bleeding without abdominal pain.   HISTORY OF PRESENT ILLNESS:  [name] is a pleasant, 77 year old,  white gentleman with a history of diverticulosis.  He has had prior  colonoscopies, by Dr. Victorino Dike, as recently as about three years ago.  We  do not have the records available at present.  He started passing blood per  rectum on the morning of Jun 24, 2005.  This was not associated with any  abdominal pain, or nausea, vomiting.  He went to his primary care  physician's office, who is Dr. Clarene Duke where he proceeded to pass out in the  bathroom.  EMS was called and brought into the [facility] ER.  Note, that he  did revive very quickly in the office, after the syncopal spell.  His  initial systolic blood pressure was in the 60s but it came up nicely after  IV fluids.  He did take a single NSAID yesterday and does take a daily 81 mg  aspirin.  The patient had completed a course of Augmentin, on Jun 22, 2005,  for an upper respiratory infection.  The patient was evaluated in the  emergency room by Dr. Rob Bunting and was admitted for presumed  diverticular bleed.   ALLERGIES:  No known drug allergies.   CURRENT MEDICATIONS:  1.  Zocor 40 mg daily.  2.  Aspirin 81 mg daily.  3.  Multivitamin once daily.  4.  Fish oil once daily.   PAST MEDICAL HISTORY:  1.  Gastroesophageal reflux disease.  2.  He is status post Nissen fundoplication in 2004.  3.  Hyperlipidemia.  4.  Diverticulosis.  5.  Chronic cough for which he has just finished a prednisone taper.  6.  Recent upper respiratory infection for  which he just completed a course      of Augmentin.  7.  Status post colonoscopy in 2003.  8.  Questionable diverticulitis, 15 years ago.  9.  Degenerative joint disease, particularly in the knees.   SOCIAL HISTORY:  The patient is married.  He lives [place].  Two  children, two grandchildren.  He drinks about two glasses of wine a day,  does not smoke, or chew tobacco products.   FAMILY HISTORY:  Father died with throat cancer.  Mother died with breast  cancer.   REVIEW OF SYSTEMS:  CARDIOVASCULAR:  No chest pain, no palpitations.  PULMONARY:  Some shortness of breath and a productive cough.  NEUROLOGIC:  No headache, no history of seizures, no extremity weakness or numbness, no  double vision.  Did have the syncopal episode as described above.  HEMATOLOGY:  No unusual bleeding or bruising, other than this recent  rectal  bleeding.  Has had some elevated blood sugars in the past but is not on any diabetes  medication.  GI:  No dysphagia.  No history of ulcers.  No constipation.  GU:  No urgency, no incontinence, no frequency.  MUSCULOSKELETAL:  Does have  knee pain at times.  Does not use any additional nonsteroidal products other  than the daily aspirin.  He does have occasional insomnia because of his  cough.   PHYSICAL EXAMINATION:  VITAL SIGNS:  Blood pressure 99/62, pulse 95,  respirations 18, temperature 99.2.  Room air saturation 99%.  GENERAL:  The patient is a pleasant elderly white male who is a good  historian and appears healthy.  He is alert and oriented x3.  HEENT:  Sclerae are nonicteric.  There is no conjunctival pallor.  Extraocular  movements are intact.  Oropharynx is moist and clear.  The tongue is  midline.  There is no speech deficits.  NECK:  No JVD, no masses, no thyromegaly.  CHEST:  Lungs are clear to auscultation percussion bilaterally.  No  shortness of breath, not displaying any cough currently.  COR:  There is regular rate and rhythm.  No murmurs,  rubs or gallops  appreciated.  ABDOMEN:  Soft, nontender, nondistended.  Bowel sounds are active.  RECTAL:  Not performed as he has been passing gross red blood per staff.  EXTREMITIES:  No cyanosis, clubbing or edema.  NEUROLOGIC:  No tremor.  He is alert and oriented x3.  Strength in the upper  and lower extremities is 5/5.  PSYCHIATRIC:  The patient is pleasant.  He is appropriate.  There is no  indication of any confusion.  GU:  Exam not performed.   LABORATORY:  Hemoglobin 15.6, hematocrit 44.6, MCV 93.4, platelets 141,000,  white blood cell count 13.5.  PT 14.4, INR 1.1, and PTT of 21.  Sodium 140,  potassium 3.8, chloride 116, CO2 24, glucose 136, BUN 19, creatinine 0.8.  Albumin 2.8, total protein 4.6.  Total bilirubin, alkaline phosphatase, AST  and ALT all within normal limits.  CK is 240, CK-MB is 17.1.  The CK  relative index is 7.1.  Troponin-I is 0.01.   IMPRESSION:  1.  Acute gastrointestinal bleed, almost certainly lower gastrointestinal in      nature and probably diverticular.  His BUN is not elevated.  2.  Syncope associated with gastrointestinal bleed, probably vasovagal but      with somewhat increased cardiac markers, need to rule out cardiac      etiology and cardiac ischemia.  3.  Increased CK and CK-MB with normal troponin.  Rule out cardiac ischemia.      The patient not having any chest pain or palpitations.  4.  History of diverticulosis on colonoscopy.  Possible history of remote      diverticulitis.  5.  Recent upper respiratory infection, having completed a course of      Augmentin.  6.  Chronic cough for which he just completed a prednisone taper.  7.  Status post laparoscopic Nissen fundoplication.   PLAN:  1.  The patient to be admitted to ICU bed for close observation.  2.  Plan serial hemoglobin and hematocrit.  3.  Plan to continue IV fluid support.  4.  Plan to get cardiology consultation with Dr. Reyes Ivan, who has performed     stress test on  the patient in the past.  5.  Obtain serial hemoglobin and hematocrits and transfuse the patient  as      necessary.  6.  At present, no plans to repeat colonoscopy or to perform a nuclear      medicine __________  red blood cell scan unless the GI bleeding does not      resolve in a timely manner.      Jennye Moccasin, P.A. [facility]    ______________________________  Rachael Fee, M.D.    SG/MEDQ  D:  06/26/2005  T:  06/26/2005  Job:  [id number]

## 2010-07-04 NOTE — Discharge Summary (Signed)
[name], [name] NO.:  [id number]   MEDICAL RECORD NO.:  [id number]          PATIENT TYPE:  INP   LOCATION:  3734                         FACILITY:  [facility]   PHYSICIAN:  Rachael Fee, M.D. DATE OF BIRTH:  1931-04-06   DATE OF ADMISSION:  06/24/2005  DATE OF DISCHARGE:  06/26/2005                                 DISCHARGE SUMMARY   DISCHARGE DIAGNOSES:  1.  Lower gastrointestinal bleed presumed diverticular.  2.  History of diverticulosis and questionable remote history of      diverticulitis.  3.  Elevated CK and CK-MB and relative index with normal troponin I.      Cardiology feels there is no ischemia at present.  4.  Abnormal echocardiogram revealing foreshortened parasternal short axis      which will require follow-up echocardiogram in 3-4 months with planned      office visit on July 22, 2005.  5.  Blood loss without acute anemia secondary to presumed diverticulosis.  6.  Elevated glucose at 136, rule out glucose intolerance versus diabetes      mellitus type 2.  7.  Cerebrovascular accident.   BRIEF HISTORY:  [name] is a pleasant 77 year old man who is generally  quite active.  He developed painless rectal bleeding on the morning of Jun 24, 2005.  Went to his primary care physician's office that day and passed  out in the bathroom but revived quite quickly.  EMS transported the patient  to the emergency room with systolic blood pressure in the 60s but it came up  into the 90s and low 100s once he got some IV fluids.  He had no abdominal  pain, nausea or vomiting.  He takes an aspirin every day and had taken one  Aleve the day before.  He did not have any history of ulcers but did have a  history of diverticulosis on colonoscopy in 2003.  The patient was admitted  by Dr. Christella Hartigan for supportive care and further workup.   LABORATORIES:  Initial hemoglobin 15.6, nadir 12.5, it measured 13.7 at  discharge.  Hematocrit was 36.6 at discharge.   Platelets ranged 141,000-  187,000.  PT 14.4, INR 1.1, PTT 21.  Sodium 140, potassium 3.8.  Glucose  136, BUN 19, creatinine 0.8.  Albumin 2.8.  Total bilirubin 0.7, alkaline  phosphatase 40, AST 18, ALT 12.  CK/CK-MB was 240/17.1 and 137/8.4.  Relative index 7.1 and 6.2.  Troponin I 0.01 on two occasions.   ECHOCARDIOGRAM SUMMARY:  Normal left ventricular size and function.  Possible inferior wall motion abnormality noted on a single isolated view.  Overall left ventricular ejection fraction estimated at 50-55% which is  lower limits of normal.  The Doppler parameter is consistent with abnormal  left ventricular relaxation though this left ventricular filling pattern is  seen with aging.  Aortic valve thickness mildly increased.  No mitral valve  prolapse.  Frequent PVCs noted throughout the echo study.   X-RAYS:  Sinus films showed maxillary and sphenoid sinusitis was some  involvement of the ethmoid and left frontal  sinus as well.   HOSPITAL COURSE:  Problem 1. GASTROINTESTINAL BLEED.  The patient had two or  three episodes of hematochezia by the time he got to the emergency room.  When seen the following morning on Jun 25, 2005, he had not had any BMs for  14 hours with the last bowel movement being at 6 p.m. on Jun 24, 2005.  Subsequent bowel movements were dark and tarry in color representing what  was felt to be old blood and not fresh blood.  The patient and his wife were  concerned however because red color leached out from these tarry stools when  it hit the commode water.  They were reassured that this represented old  blood.  Serial hemoglobin and hematocrit showed an initial decline but then  stabilization and actually a slight rise in his hemoglobin during the  hospital stay.  By Jun 26, 2005, he had not had any recurrent fresh bleeding  but passed again more tarry stools.  He was felt stable for discharge after  lunch on Jun 26, 2005.  His blood pressure and pulse  stabilized nicely.   Problem 2. HISTORY OF SYNCOPE.  This was felt to be vasovagal in nature and  not cardiac by Dr. Reyes Ivan.  Certainly vasovagal with the patient's  symptomatology and his history of acute GI blood loss.   Problem 3. ELEVATED CK AND CK-MB WITH NORMAL TROPONINS.  Dr. Reyes Ivan did not  feel that there had been any cardiac injury.  The EKG did not show any  ischemia or strain pattern.  There were some PVCs on the EKG.   Problem 4. ABNORMAL ECHOCARDIOGRAM.  Dr. Reyes Ivan noted that there was some  what he described as foreshortening in the parasternal short axis which he  wanted to follow up with a repeat echocardiogram in 4 months.  The patient  had a return office visit scheduled with Dr. Reyes Ivan on July 22, 2005.   Problem 5. CHRONIC COUGH AND SEEMINGLY CHRONIC SINUSITIS.  Head CT which had  been done on Jun 24, 2005 did confirm this maxillary and sphenoid sinusitis  involving the ethmoid and left frontal sinus as well.  He will probably need  to follow up with his primary care doctor and possibly with Dr. Sherene Sires or and  ENT doctor regarding this.   CONDITION ON DISCHARGE:  Stable and improved.   MEDICATIONS AT DISCHARGE:  1.  Zocor 40 mg daily.  2.  Aspirin 81 mg daily.  He is going to restart this after seeing Dr.      Reyes Ivan on July 22, 2005 and make sure that it is okay with Dr. Reyes Ivan at      that point.  3.  Nexium 40 mg daily.  4.  Centrum Silver once daily.  5.  Fish oil once daily.   FOLLOW UP:  Office visits with Dr. Reyes Ivan on July 23, 2005 and with Dr. Victorino Dike or also he could follow up with Dr. Christella Hartigan at [facility] as needed.  He and his wife were encouraged to call the office if they had any concerns  or questions regarding GI issues or recurrent bleeding.  It was also  suggested that in the future during an acute episode of GI bleeding he might  benefit from a nuclear red blood cells tag cell scan.  [place]  of his recent colonoscopy as well  as recent upper endoscopy were given to  the patient [place] that if he  had to go to the hospital that the information would be readily available to  any GI doctor.      Jennye Moccasin, P.A. [facility]    ______________________________  Rachael Fee, M.D.    SG/MEDQ  D:  06/26/2005  T:  06/27/2005  Job:  [id number]   cc:   Elmore Guise., M.D.  Fax: [phone]   Ulyess Mort, M.D. [facility]  [address]  [place]  [place] [address]   Anna Genre. Little, M.D.  Fax: [phone]   [facility]  [address]  [place], [place] [address]

## 2010-10-08 ENCOUNTER — Other Ambulatory Visit: Payer: Self-pay | Admitting: Internal Medicine

## 2010-10-08 DIAGNOSIS — R1011 Right upper quadrant pain: Secondary | ICD-10-CM

## 2010-10-10 ENCOUNTER — Ambulatory Visit
Admission: RE | Admit: 2010-10-10 | Discharge: 2010-10-10 | Disposition: A | Discharge status: Home / Self Care | Payer: Medicare Other | Source: Ambulatory Visit | Attending: Internal Medicine | Admitting: Internal Medicine

## 2010-10-10 DIAGNOSIS — R1011 Right upper quadrant pain: Secondary | ICD-10-CM

## 2010-10-10 MED ORDER — IOHEXOL 300 MG/ML  SOLN: 100.0000 mL | Freq: Once | INTRAMUSCULAR | Status: AC | PRN

## 2010-11-24 LAB — CBC
Hemoglobin: 13.9
Hemoglobin: 16.7
MCHC: 33.4
MCHC: 34
MCV: 93.8
MCV: 95.3
RBC: 4.36
RBC: 5.24
RDW: 14.2
WBC: 14.6 — ABNORMAL HIGH

## 2010-11-24 LAB — DIFFERENTIAL
Basophils Relative: 1
Eosinophils Absolute: 0.6
Lymphs Abs: 5.1 — ABNORMAL HIGH
Monocytes Absolute: 1.5 — ABNORMAL HIGH
Monocytes Relative: 10
Neutro Abs: 7.4
Neutrophils Relative %: 50

## 2010-11-24 LAB — BASIC METABOLIC PANEL
CO2: 29
Chloride: 101
Creatinine, Ser: 1.01
GFR calc Af Amer: >60
Sodium: 139

## 2011-04-20 ENCOUNTER — Other Ambulatory Visit (HOSPITAL_COMMUNITY): Payer: Self-pay | Admitting: Internal Medicine

## 2011-04-22 ENCOUNTER — Encounter (HOSPITAL_COMMUNITY): Payer: Medicare Other

## 2011-04-29 ENCOUNTER — Ambulatory Visit (HOSPITAL_COMMUNITY)
Admission: RE | Admit: 2011-04-29 | Discharge: 2011-04-29 | Disposition: A | Discharge status: Home / Self Care | Payer: Medicare Other | Source: Ambulatory Visit | Attending: Internal Medicine | Admitting: Internal Medicine

## 2011-04-29 ENCOUNTER — Other Ambulatory Visit (HOSPITAL_COMMUNITY): Payer: Self-pay | Admitting: Respiratory Therapy

## 2011-04-29 DIAGNOSIS — J45909 Unspecified asthma, uncomplicated: Secondary | ICD-10-CM | POA: Insufficient documentation

## 2011-04-29 MED ORDER — ALBUTEROL SULFATE (5 MG/ML) 0.5% IN NEBU
2.5000 mg | INHALATION_SOLUTION | Freq: Once | RESPIRATORY_TRACT | Status: AC
  Administered 2011-04-29: 2.5 mg via RESPIRATORY_TRACT

## 2011-07-20 ENCOUNTER — Encounter: Payer: Self-pay | Admitting: Gastroenterology

## 2011-08-14 ENCOUNTER — Ambulatory Visit (INDEPENDENT_AMBULATORY_CARE_PROVIDER_SITE_OTHER): Payer: Medicare Other | Admitting: Gastroenterology

## 2011-08-14 ENCOUNTER — Other Ambulatory Visit (INDEPENDENT_AMBULATORY_CARE_PROVIDER_SITE_OTHER): Payer: Medicare Other

## 2011-08-14 ENCOUNTER — Encounter: Payer: Self-pay | Admitting: Gastroenterology

## 2011-08-14 VITALS — BP 112/62 | HR 80 | Ht 71.0 in | Wt 174.1 lb

## 2011-08-14 DIAGNOSIS — R109 Unspecified abdominal pain: Secondary | ICD-10-CM

## 2011-08-14 DIAGNOSIS — K5900 Constipation, unspecified: Secondary | ICD-10-CM

## 2011-08-14 LAB — CBC WITH DIFFERENTIAL/PLATELET
Basophils Absolute: 0.1 10*3/uL (ref 0.0–0.1)
Eosinophils Absolute: 0.8 10*3/uL — ABNORMAL HIGH (ref 0.0–0.7)
HCT: 48.7 % (ref 39.0–52.0)
Hemoglobin: 15.8 g/dL (ref 13.0–17.0)
Lymphs Abs: 3.2 10*3/uL (ref 0.7–4.0)
MCHC: 32.4 g/dL (ref 30.0–36.0)
Monocytes Absolute: 1 10*3/uL (ref 0.1–1.0)
Neutro Abs: 4.4 10*3/uL (ref 1.4–7.7)
Platelets: 345 10*3/uL (ref 150.0–400.0)
RDW: 14.7 % — ABNORMAL HIGH (ref 11.5–14.6)

## 2011-08-14 NOTE — Patient Instructions (Addendum)
You will have labs checked today in the basement lab.  Please head down after you check out with the front desk  (cbc) Please start taking citrucel (orange flavored) powder fiber supplement.  This may cause some bloating at first but that usually goes away. Begin with a small spoonful and work your way up to a large, heaping spoonful daily over a week. Call to report on your symptoms (response to daily citrucel) in 5-6 weeks.

## 2011-08-14 NOTE — Progress Notes (Signed)
Review of pertinent gastrointestinal problems: 1. routine risk for colon cancer.  Colonoscopy 2003 with Dr. Doreatha Martin, polyps removed but none were adenomatous. Colonoscopy June 2009 Dr. Christella Hartigan found no polyps. Significant left sided diverticulosis was noted. 2. esophageal ring, focal stricture dilated 2009 with deeper than usual mucosal tear, followup Gastrografin esophagram showed no extravasation 3. status post fundoplication 4. Diverticular hemorrhage in 2009, did not require blood transfusion 5. Splenectomy; 2009, ruptured.  HPI: This is a  very pleasant 78 year old man whom I last saw 3-4 years ago at the time of upper and lower endoscopies. See those results summarized above.  bowle moviements have regular most of his life. Will go every 2-3 days.  Lately more constipated. Has to push, strain.  No overt bleeding.  zocor does not hcanged in years.  Discomforts for 2-3 months.  The lower discomfort had been daily.  Probably worse with constipation.  Tried citrucel, miralax but not on regular basis usually.  He feels that when he does take these products more regularly that his bowels are moving easier and his discomforts have lessened.    Review of systems: Pertinent positive and negative review of systems were noted in the above HPI section. Complete review of systems was performed and was otherwise normal.    Past Medical History  Diagnosis Date  . Diverticulosis   . Hiatal hernia   . Colitis   . Hemorrhoids   . Colon polyps   . Proctitis   . GERD (gastroesophageal reflux disease)   . Osteoarthritis   . Asthma   . COPD (chronic obstructive pulmonary disease)   . DM (diabetes mellitus)   . Status post dilation of esophageal narrowing   . HLD (hyperlipidemia)   . Stroke     Past Surgical History  Procedure Date  . Nissen   . Splenectomy, total   . Esophageal hernia   . Abdominal hernia repair     Current Outpatient Prescriptions  Medication Sig Dispense Refill  .  clopidogrel (PLAVIX) 75 MG tablet Take 75 mg by mouth daily.      . fluticasone-salmeterol (ADVAIR HFA) 115-21 MCG/ACT inhaler Inhale 2 puffs into the lungs 2 (two) times daily.      . simvastatin (ZOCOR) 40 MG tablet Take 40 mg by mouth every evening.        Allergies as of 08/14/2011  . (No Known Allergies)    Family History  Problem Relation Age of Onset  . Cancer      History   Social History  . Marital Status: Married    Spouse Name: N/A    Number of Children: 2  . Years of Education: N/A   Occupational History  . retired    Social History Main Topics  . Smoking status: Never Smoker   . Smokeless tobacco: Never Used  . Alcohol Use: Yes     1 1/2 - 2 daily  . Drug Use: No  . Sexually Active: Not on file   Other Topics Concern  . Not on file   Social History Narrative  . No narrative on file       Physical Exam: BP 112/62  Pulse 80  Ht 5\' 11"  (1.803 m)  Wt 174 lb 2 oz (78.983 kg)  BMI 24.29 kg/m2 Constitutional: generally well-appearing Psychiatric: alert and oriented x3 Eyes: extraocular movements intact Mouth: oral pharynx moist, no lesions Neck: supple no lymphadenopathy Cardiovascular: heart regular rate and rhythm Lungs: clear to auscultation bilaterally Abdomen: soft, nontender, nondistended, no  obvious ascites, no peritoneal signs, normal bowel sounds Extremities: no lower extremity edema bilaterally Skin: no lesions on visible extremities    Assessment and plan: 78 y.o. male with  mild change in bowels, constipation  It seems like MiraLax and or fiber has been helping his constipation. I recommended he begin taking the fiber supplements on a scheduled, daily basis rather than as needed.  He had a colonoscopy less than 4 years ago I don't think that needs to be repeated now given the fact that it was normal, colonoscopy 5 years prior also found no significant pathology. He has seen no blood in his stool. He'll get a CBC today to check to see  if he is significantly anemic. He is going to call here in 5-6 weeks for 4 to his response daily fiber supplements.

## 2011-12-07 ENCOUNTER — Telehealth: Payer: Self-pay | Admitting: Gastroenterology

## 2011-12-07 NOTE — Telephone Encounter (Signed)
Pt continues to have lower abd pain and per last note pt was to call and report on symptoms.  No available appts until middle November.  Is this ok or do you want something sooner?

## 2011-12-08 NOTE — Telephone Encounter (Signed)
Pt is going to be out please call him at [phone]

## 2011-12-08 NOTE — Telephone Encounter (Signed)
That is ok.  I last saw him in June, he was asked to call back in 3-4 weeks from then.

## 2011-12-09 NOTE — Telephone Encounter (Signed)
Pt has been given appt for 12/11/11   There was a cx and pt was added and is aware

## 2011-12-11 ENCOUNTER — Encounter: Payer: Self-pay | Admitting: Gastroenterology

## 2011-12-11 ENCOUNTER — Ambulatory Visit (INDEPENDENT_AMBULATORY_CARE_PROVIDER_SITE_OTHER): Payer: Medicare Other | Admitting: Gastroenterology

## 2011-12-11 VITALS — BP 132/70 | HR 76 | Ht 69.75 in | Wt 172.4 lb

## 2011-12-11 DIAGNOSIS — K432 Incisional hernia without obstruction or gangrene: Secondary | ICD-10-CM

## 2011-12-11 DIAGNOSIS — R109 Unspecified abdominal pain: Secondary | ICD-10-CM

## 2011-12-11 MED ORDER — DICYCLOMINE HCL 10 MG PO CAPS: 10.0000 mg | ORAL_CAPSULE | Freq: Two times a day (BID) | ORAL | Status: DC

## 2011-12-11 NOTE — Progress Notes (Signed)
Review of pertinent gastrointestinal problems:  1. routine risk for colon cancer. Colonoscopy 2003 with Dr. Doreatha Martin, polyps removed but none were adenomatous. Colonoscopy June 2009 Dr. Christella Hartigan found no polyps. Significant left sided diverticulosis was noted.  2. esophageal ring, focal stricture dilated 2009 with deeper than usual mucosal tear, followup Gastrografin esophagram showed no extravasation  3. status post fundoplication  4. Diverticular hemorrhage in 2009, did not require blood transfusion  5. Splenectomy; 2009, ruptured.  HPI: This is a     very pleasant 78 year old man whom I last saw about 4 months ago. He is having lower abdominal discomforts waxed and waned, sometimes they're constant. Moving his bowels does not clearly helped her pains. Put him on fiber supplements for 4 months ago to help regulate his bowels, hoping that would help his discomfort however it really hasn't. He has an incisional hernia since his splenectomy 2009, midline incision. He tells me that the site tends to ache at times and can even be tender. It does not sound like it has ever been trapped.     Past Medical History  Diagnosis Date  . Diverticulosis   . Hiatal hernia   . Colitis   . Hemorrhoids   . Colon polyps   . Proctitis   . GERD (gastroesophageal reflux disease)   . Osteoarthritis   . Asthma   . COPD (chronic obstructive pulmonary disease)   . DM (diabetes mellitus)   . Status post dilation of esophageal narrowing   . HLD (hyperlipidemia)   . Stroke     Past Surgical History  Procedure Date  . Nissen   . Splenectomy, total   . Esophageal hernia   . Abdominal hernia repair     Current Outpatient Prescriptions  Medication Sig Dispense Refill  . clopidogrel (PLAVIX) 75 MG tablet Take 75 mg by mouth daily.      . fluticasone-salmeterol (ADVAIR HFA) 115-21 MCG/ACT inhaler Inhale 2 puffs into the lungs 2 (two) times daily.      . methylcellulose (CITRUCEL) oral powder Take 1 packet by mouth  daily.      . simvastatin (ZOCOR) 40 MG tablet Take 40 mg by mouth every evening.        Allergies as of 12/11/2011  . (No Known Allergies)    Family History  Problem Relation Age of Onset  . Cancer      History   Social History  . Marital Status: Married    Spouse Name: N/A    Number of Children: 2  . Years of Education: N/A   Occupational History  . retired    Social History Main Topics  . Smoking status: Never Smoker   . Smokeless tobacco: Never Used  . Alcohol Use: Yes     1 1/2 - 2 daily  . Drug Use: No  . Sexually Active: Not on file   Other Topics Concern  . Not on file   Social History Narrative  . No narrative on file      Physical Exam: BP 132/70  Pulse 76  Ht 5' 9.75" (1.772 m)  Wt 172 lb 6 oz (78.189 kg)  BMI 24.91 kg/m2 Constitutional: generally well-appearing Psychiatric: alert and oriented x3 Abdomen: soft, nontender, nondistended, no obvious ascites, no peritoneal signs, normal bowel sounds + Obvious incisional, midline hernia at 2 sites. When he stands it seems to be protruding rather tensely, sounds like a small knuckle of bowel given that it audibly is fluid-filled.     Assessment and  plan: 78 y.o. male with abdominal pain, intermittently sore, tender incisional hernia  going to put him on antispasmodics to see if that helps his intermittent pains. I am concerned that he is having some symptoms from the incisional hernias. He has 2 hernias in the midline incision, the lower hernia protrudes rather tensely even when he stands and I can see how this can cause symptoms. He is going to get back in to see his surgeon and good friend Dr Lindie Spruce to discuss this.

## 2011-12-11 NOTE — Patient Instructions (Addendum)
Trial of twice daily antispasm meds (for bowel). Get back in to see Dr. Lindie Spruce about your incisional hernia, it MAY be causing your abdominal pains.

## 2011-12-15 ENCOUNTER — Ambulatory Visit (INDEPENDENT_AMBULATORY_CARE_PROVIDER_SITE_OTHER): Payer: Medicare Other | Admitting: General Surgery

## 2011-12-15 DIAGNOSIS — K432 Incisional hernia without obstruction or gangrene: Secondary | ICD-10-CM

## 2011-12-15 NOTE — Progress Notes (Signed)
The patient has developed multiple hernias along his midline fascia. We previously repaired an upper midline epigastric hernia laparoscopically. That one appears to have recurred along with a mid incisional hernia and one in the lower aspect of his midline incision from a previous splenectomy.  The patient is having more discomfort along the hernias. We like to have them repaired as soon as possible.  In order to repair his hernias which are reducible but tender we would have to do a complete wide opening of his hernia defects with primary repair with on underlying mesh. We will go ahead and schedule this as soon as possible.

## 2012-01-12 ENCOUNTER — Encounter (HOSPITAL_COMMUNITY): Payer: Self-pay | Admitting: Pharmacy Technician

## 2012-01-15 ENCOUNTER — Encounter (HOSPITAL_COMMUNITY)
Admission: RE | Admit: 2012-01-15 | Discharge: 2012-01-15 | Disposition: A | Discharge status: Home / Self Care | Payer: Medicare Other | Source: Ambulatory Visit | Attending: General Surgery | Admitting: General Surgery

## 2012-01-15 ENCOUNTER — Encounter (HOSPITAL_COMMUNITY): Payer: Self-pay

## 2012-01-15 LAB — BASIC METABOLIC PANEL
BUN: 12 mg/dL (ref 6–23)
Chloride: 99 mEq/L (ref 96–112)
GFR calc Af Amer: >90 mL/min (ref >90)
GFR calc non Af Amer: 83 mL/min — ABNORMAL LOW (ref >90)
Potassium: 4 mEq/L (ref 3.5–5.1)
Sodium: 136 mEq/L (ref 135–145)

## 2012-01-15 LAB — CBC WITH DIFFERENTIAL/PLATELET
Basophils Relative: 1 % (ref 0–1)
HCT: 47.5 % (ref 39.0–52.0)
Hemoglobin: 15.3 g/dL (ref 13.0–17.0)
Lymphocytes Relative: 27 % (ref 12–46)
Monocytes Absolute: 1.1 10*3/uL — ABNORMAL HIGH (ref 0.1–1.0)
Monocytes Relative: 10 % (ref 3–12)
Neutro Abs: 5.8 10*3/uL (ref 1.7–7.7)
Neutrophils Relative %: 56 % (ref 43–77)
RBC: 4.96 MIL/uL (ref 4.22–5.81)
WBC: 10.4 10*3/uL (ref 4.0–10.5)

## 2012-01-15 NOTE — Pre-Procedure Instructions (Signed)
[name]  01/15/2012   Your procedure is scheduled on:  01/29/12  Report to [facility] at 530 AM.  Call this number if you have problems the morning of surgery: [phone]   Remember:   Do not eat food:After Midnight.     Take these medicines the morning of surgery with A SIP OF WATER: *all inhalers   Do not wear jewelry, make-up or nail polish.  Do not wear lotions, powders, or perfumes. You may wear deodorant.  Do not shave 48 hours prior to surgery. Men may shave face and neck.  Do not bring valuables to the hospital.  Contacts, dentures or bridgework may not be worn into surgery.  Leave suitcase in the car. After surgery it may be brought to your room.  For patients admitted to the hospital, checkout time is 11:00 AM the day of discharge.   Patients discharged the day of surgery will not be allowed to drive home.  Name and phone number of your driver: family  Special Instructions: Shower using CHG 2 nights before surgery and the night before surgery.  If you shower the day of surgery use CHG.  Use special wash - you have one bottle of CHG for all showers.  You should use approximately 1/3 of the bottle for each shower.   Please read over the following fact sheets that you were given: Pain Booklet, Coughing and Deep Breathing, MRSA Information and Surgical Site Infection Prevention

## 2012-01-28 MED ORDER — CEFAZOLIN SODIUM-DEXTROSE 2-3 GM-% IV SOLR
2.0000 g | INTRAVENOUS | Status: AC
  Administered 2012-01-29: 2 g via INTRAVENOUS
  Filled 2012-01-28: qty 50

## 2012-01-29 ENCOUNTER — Encounter (HOSPITAL_COMMUNITY)
Admission: RE | Disposition: A | Discharge status: Home / Self Care | Payer: Self-pay | Source: Ambulatory Visit | Attending: General Surgery

## 2012-01-29 ENCOUNTER — Inpatient Hospital Stay (HOSPITAL_COMMUNITY)
Admission: RE | Admit: 2012-01-29 | Discharge: 2012-02-04 | DRG: 355 | Disposition: A | Discharge status: Home / Self Care | Payer: Medicare Other | Source: Ambulatory Visit | Attending: General Surgery | Admitting: General Surgery

## 2012-01-29 ENCOUNTER — Encounter (HOSPITAL_COMMUNITY): Payer: Self-pay | Admitting: General Practice

## 2012-01-29 ENCOUNTER — Encounter (HOSPITAL_COMMUNITY): Payer: Self-pay | Admitting: *Deleted

## 2012-01-29 ENCOUNTER — Encounter (HOSPITAL_COMMUNITY): Payer: Self-pay | Admitting: Anesthesiology

## 2012-01-29 ENCOUNTER — Ambulatory Visit (HOSPITAL_COMMUNITY): Payer: Medicare Other | Admitting: Anesthesiology

## 2012-01-29 DIAGNOSIS — K432 Incisional hernia without obstruction or gangrene: Secondary | ICD-10-CM

## 2012-01-29 DIAGNOSIS — K449 Diaphragmatic hernia without obstruction or gangrene: Secondary | ICD-10-CM | POA: Diagnosis present

## 2012-01-29 DIAGNOSIS — K219 Gastro-esophageal reflux disease without esophagitis: Secondary | ICD-10-CM | POA: Diagnosis present

## 2012-01-29 DIAGNOSIS — E875 Hyperkalemia: Secondary | ICD-10-CM | POA: Diagnosis present

## 2012-01-29 DIAGNOSIS — D72829 Elevated white blood cell count, unspecified: Secondary | ICD-10-CM | POA: Diagnosis not present

## 2012-01-29 DIAGNOSIS — K439 Ventral hernia without obstruction or gangrene: Principal | ICD-10-CM | POA: Diagnosis present

## 2012-01-29 DIAGNOSIS — J449 Chronic obstructive pulmonary disease, unspecified: Secondary | ICD-10-CM | POA: Diagnosis present

## 2012-01-29 DIAGNOSIS — E119 Type 2 diabetes mellitus without complications: Secondary | ICD-10-CM | POA: Diagnosis present

## 2012-01-29 DIAGNOSIS — J4489 Other specified chronic obstructive pulmonary disease: Secondary | ICD-10-CM | POA: Diagnosis present

## 2012-01-29 HISTORY — PX: VENTRAL HERNIA REPAIR: SHX424

## 2012-01-29 HISTORY — PX: INSERTION OF MESH: SHX5868

## 2012-01-29 LAB — GLUCOSE, CAPILLARY

## 2012-01-29 SURGERY — REPAIR, HERNIA, VENTRAL
Anesthesia: General | Wound class: Clean

## 2012-01-29 MED ORDER — POVIDONE-IODINE 10 % EX OINT
TOPICAL_OINTMENT | CUTANEOUS | Status: AC
  Filled 2012-01-29: qty 28.35

## 2012-01-29 MED ORDER — ROCURONIUM BROMIDE 100 MG/10ML IV SOLN
INTRAVENOUS | Status: DC | PRN
  Administered 2012-01-29 (×2): 10 mg via INTRAVENOUS
  Administered 2012-01-29: 40 mg via INTRAVENOUS
  Administered 2012-01-29: 10 mg via INTRAVENOUS

## 2012-01-29 MED ORDER — DICYCLOMINE HCL 10 MG PO CAPS
10.0000 mg | ORAL_CAPSULE | Freq: Two times a day (BID) | ORAL | Status: DC
  Administered 2012-01-29 – 2012-02-03 (×9): 10 mg via ORAL
  Filled 2012-01-29 (×17): qty 1

## 2012-01-29 MED ORDER — NALOXONE HCL 0.4 MG/ML IJ SOLN
0.4000 mg | INTRAMUSCULAR | Status: DC | PRN
  Filled 2012-01-29: qty 1

## 2012-01-29 MED ORDER — PROPOFOL 10 MG/ML IV BOLUS
INTRAVENOUS | Status: DC | PRN
  Administered 2012-01-29: 160 mg via INTRAVENOUS

## 2012-01-29 MED ORDER — SIMVASTATIN 20 MG PO TABS
20.0000 mg | ORAL_TABLET | Freq: Every day | ORAL | Status: DC
  Administered 2012-01-29 – 2012-02-03 (×5): 20 mg via ORAL
  Filled 2012-01-29 (×8): qty 1

## 2012-01-29 MED ORDER — ONDANSETRON HCL 4 MG/2ML IJ SOLN
4.0000 mg | Freq: Four times a day (QID) | INTRAMUSCULAR | Status: DC | PRN
  Administered 2012-01-29 – 2012-01-31 (×2): 4 mg via INTRAVENOUS
  Filled 2012-01-29 (×3): qty 2

## 2012-01-29 MED ORDER — ENOXAPARIN SODIUM 40 MG/0.4ML ~~LOC~~ SOLN
40.0000 mg | SUBCUTANEOUS | Status: DC
  Filled 2012-01-29: qty 0.4

## 2012-01-29 MED ORDER — ENOXAPARIN SODIUM 40 MG/0.4ML ~~LOC~~ SOLN
40.0000 mg | SUBCUTANEOUS | Status: DC
  Administered 2012-01-30 – 2012-02-04 (×6): 40 mg via SUBCUTANEOUS
  Filled 2012-01-29 (×8): qty 0.4

## 2012-01-29 MED ORDER — POVIDONE-IODINE 10 % EX OINT
TOPICAL_OINTMENT | CUTANEOUS | Status: DC | PRN
  Administered 2012-01-29: 1 via TOPICAL

## 2012-01-29 MED ORDER — ZOLPIDEM TARTRATE 5 MG PO TABS
5.0000 mg | ORAL_TABLET | Freq: Every evening | ORAL | Status: DC | PRN
  Administered 2012-02-02 – 2012-02-03 (×2): 5 mg via ORAL
  Filled 2012-01-29 (×2): qty 1

## 2012-01-29 MED ORDER — 0.9 % SODIUM CHLORIDE (POUR BTL) OPTIME
TOPICAL | Status: DC | PRN
  Administered 2012-01-29: 1000 mL

## 2012-01-29 MED ORDER — ONDANSETRON HCL 4 MG/2ML IJ SOLN
INTRAMUSCULAR | Status: DC | PRN
  Administered 2012-01-29: 4 mg via INTRAVENOUS

## 2012-01-29 MED ORDER — HYDROMORPHONE HCL PF 1 MG/ML IJ SOLN
INTRAMUSCULAR | Status: AC
  Filled 2012-01-29: qty 1

## 2012-01-29 MED ORDER — LIDOCAINE HCL (CARDIAC) 20 MG/ML IV SOLN
INTRAVENOUS | Status: DC | PRN
  Administered 2012-01-29: 50 mg via INTRAVENOUS

## 2012-01-29 MED ORDER — HYDROMORPHONE HCL PF 1 MG/ML IJ SOLN
0.2500 mg | INTRAMUSCULAR | Status: DC | PRN
  Administered 2012-01-29 (×2): 0.5 mg via INTRAVENOUS

## 2012-01-29 MED ORDER — BUPIVACAINE HCL (PF) 0.25 % IJ SOLN
INTRAMUSCULAR | Status: AC
  Filled 2012-01-29: qty 30

## 2012-01-29 MED ORDER — HYDROMORPHONE 0.3 MG/ML IV SOLN
INTRAVENOUS | Status: AC
  Administered 2012-01-29: 11:00:00
  Filled 2012-01-29: qty 25

## 2012-01-29 MED ORDER — HYDROMORPHONE 0.3 MG/ML IV SOLN
INTRAVENOUS | Status: DC
  Administered 2012-01-29: 0.6 mg via INTRAVENOUS
  Administered 2012-01-29: 2 mg via INTRAVENOUS
  Administered 2012-01-29: 1.1 mg via INTRAVENOUS
  Administered 2012-01-30: 6.6 mg via INTRAVENOUS
  Administered 2012-01-30: 22:00:00 via INTRAVENOUS
  Administered 2012-01-30: 1.8 mg via INTRAVENOUS
  Administered 2012-01-30: 11:00:00 via INTRAVENOUS
  Administered 2012-01-30: 3.48 mg via INTRAVENOUS
  Administered 2012-01-30: 2.4 mg via INTRAVENOUS
  Administered 2012-01-30: 5 mg via INTRAVENOUS
  Administered 2012-01-31: 12:00:00 via INTRAVENOUS
  Administered 2012-01-31: 2 mg via INTRAVENOUS
  Administered 2012-01-31: 1.5 mg via INTRAVENOUS
  Administered 2012-01-31: 20 mg via INTRAVENOUS
  Administered 2012-01-31: 8 mg via INTRAVENOUS
  Administered 2012-01-31: 13.5 mg via INTRAVENOUS
  Administered 2012-02-01: 1.12 mg via INTRAVENOUS
  Administered 2012-02-01: 0.6 mg via INTRAVENOUS
  Administered 2012-02-01: 2.1 mg via INTRAVENOUS
  Administered 2012-02-01: 05:00:00 via INTRAVENOUS
  Administered 2012-02-01: 4.2 mg via INTRAVENOUS
  Administered 2012-02-01: 9 mL via INTRAVENOUS
  Administered 2012-02-01: 1.5 mg via INTRAVENOUS
  Administered 2012-02-02: via INTRAVENOUS
  Administered 2012-02-02: 2.4 mg via INTRAVENOUS
  Administered 2012-02-02: 0.6 mg via INTRAVENOUS
  Filled 2012-01-29 (×6): qty 25

## 2012-01-29 MED ORDER — DIPHENHYDRAMINE HCL 50 MG/ML IJ SOLN
12.5000 mg | Freq: Four times a day (QID) | INTRAMUSCULAR | Status: DC | PRN
  Filled 2012-01-29: qty 0.25

## 2012-01-29 MED ORDER — EPHEDRINE SULFATE 50 MG/ML IJ SOLN
INTRAMUSCULAR | Status: DC | PRN
  Administered 2012-01-29: 10 mg via INTRAVENOUS

## 2012-01-29 MED ORDER — DIPHENHYDRAMINE HCL 12.5 MG/5ML PO ELIX
12.5000 mg | ORAL_SOLUTION | Freq: Four times a day (QID) | ORAL | Status: DC | PRN
  Filled 2012-01-29: qty 5

## 2012-01-29 MED ORDER — LACTATED RINGERS IV SOLN
INTRAVENOUS | Status: DC | PRN
  Administered 2012-01-29 (×2): via INTRAVENOUS

## 2012-01-29 MED ORDER — SODIUM CHLORIDE 0.9 % IJ SOLN: 9.0000 mL | INTRAMUSCULAR | Status: DC | PRN

## 2012-01-29 MED ORDER — ZOLPIDEM TARTRATE 5 MG PO TABS: 10.0000 mg | ORAL_TABLET | Freq: Every evening | ORAL | Status: DC | PRN

## 2012-01-29 MED ORDER — SODIUM CHLORIDE 0.9 % IR SOLN
Status: DC | PRN
  Administered 2012-01-29: 08:00:00

## 2012-01-29 MED ORDER — CEFAZOLIN SODIUM 1-5 GM-% IV SOLN
1.0000 g | Freq: Four times a day (QID) | INTRAVENOUS | Status: AC
  Administered 2012-01-29 – 2012-01-30 (×3): 1 g via INTRAVENOUS
  Filled 2012-01-29 (×3): qty 50

## 2012-01-29 MED ORDER — FENTANYL CITRATE 0.05 MG/ML IJ SOLN
INTRAMUSCULAR | Status: DC | PRN
  Administered 2012-01-29 (×2): 100 ug via INTRAVENOUS
  Administered 2012-01-29 (×4): 50 ug via INTRAVENOUS

## 2012-01-29 MED ORDER — MOMETASONE FURO-FORMOTEROL FUM 100-5 MCG/ACT IN AERO
2.0000 | INHALATION_SPRAY | Freq: Two times a day (BID) | RESPIRATORY_TRACT | Status: DC
Start: 2012-01-29 — End: 2012-01-30
  Filled 2012-01-29: qty 8.8

## 2012-01-29 MED ORDER — KCL IN DEXTROSE-NACL 20-5-0.45 MEQ/L-%-% IV SOLN
INTRAVENOUS | Status: DC
  Administered 2012-01-29 – 2012-01-30 (×2): via INTRAVENOUS
  Administered 2012-01-30 – 2012-01-31 (×3): 100 mL/h via INTRAVENOUS
  Filled 2012-01-29 (×8): qty 1000

## 2012-01-29 SURGICAL SUPPLY — 56 items
BAG DECANTER FOR FLEXI CONT (MISCELLANEOUS) ×2 IMPLANT
BINDER ABD UNIV 12 45-62 (WOUND CARE) ×1 IMPLANT
BINDER ABDOMINAL 46IN 62IN (WOUND CARE) ×2
BLADE SURG ROTATE 9660 (MISCELLANEOUS) ×1 IMPLANT
CANISTER SUCTION 2500CC (MISCELLANEOUS) ×2 IMPLANT
CHLORAPREP W/TINT 26ML (MISCELLANEOUS) ×2 IMPLANT
CLOTH BEACON ORANGE TIMEOUT ST (SAFETY) ×2 IMPLANT
COVER SURGICAL LIGHT HANDLE (MISCELLANEOUS) ×2 IMPLANT
DRAIN CHANNEL 19F RND (DRAIN) ×2 IMPLANT
DRAPE LAPAROSCOPIC ABDOMINAL (DRAPES) ×2 IMPLANT
DRAPE UTILITY 15X26 W/TAPE STR (DRAPE) ×4 IMPLANT
ELECT BLADE 4.0 EZ CLEAN MEGAD (MISCELLANEOUS) ×2
ELECT CAUTERY BLADE 6.4 (BLADE) ×2 IMPLANT
ELECT REM PT RETURN 9FT ADLT (ELECTROSURGICAL) ×2
ELECTRODE BLDE 4.0 EZ CLN MEGD (MISCELLANEOUS) IMPLANT
ELECTRODE REM PT RTRN 9FT ADLT (ELECTROSURGICAL) ×1 IMPLANT
EVACUATOR SILICONE 100CC (DRAIN) ×2 IMPLANT
GLOVE BIOGEL PI IND STRL 7.0 (GLOVE) IMPLANT
GLOVE BIOGEL PI IND STRL 7.5 (GLOVE) IMPLANT
GLOVE BIOGEL PI IND STRL 8 (GLOVE) ×1 IMPLANT
GLOVE BIOGEL PI INDICATOR 7.0 (GLOVE) ×1
GLOVE BIOGEL PI INDICATOR 7.5 (GLOVE) ×2
GLOVE BIOGEL PI INDICATOR 8 (GLOVE) ×1
GLOVE ECLIPSE 7.5 STRL STRAW (GLOVE) ×2 IMPLANT
GLOVE SURG SIGNA 7.5 PF LTX (GLOVE) ×1 IMPLANT
GLOVE SURG SS PI 7.0 STRL IVOR (GLOVE) ×1 IMPLANT
GOWN STRL NON-REIN LRG LVL3 (GOWN DISPOSABLE) ×6 IMPLANT
KIT BASIN OR (CUSTOM PROCEDURE TRAY) ×2 IMPLANT
KIT ROOM TURNOVER OR (KITS) ×2 IMPLANT
NDL HYPO 25GX1X1/2 BEV (NEEDLE) ×1 IMPLANT
NEEDLE HYPO 25GX1X1/2 BEV (NEEDLE) ×2 IMPLANT
NS IRRIG 1000ML POUR BTL (IV SOLUTION) ×2 IMPLANT
PACK GENERAL/GYN (CUSTOM PROCEDURE TRAY) ×2 IMPLANT
PAD ARMBOARD 7.5X6 YLW CONV (MISCELLANEOUS) ×2 IMPLANT
SPONGE GAUZE 4X4 12PLY (GAUZE/BANDAGES/DRESSINGS) ×2 IMPLANT
STAPLER VISISTAT 35W (STAPLE) ×1 IMPLANT
SUT ETHIBOND NAB CTX #1 30IN (SUTURE) IMPLANT
SUT ETHILON 3 0 FSL (SUTURE) ×2 IMPLANT
SUT MNCRL AB 4-0 PS2 18 (SUTURE) ×2 IMPLANT
SUT NOVA 1 T20/GS 25DT (SUTURE) ×8 IMPLANT
SUT PROLENE 1 CT (SUTURE) IMPLANT
SUT SILK 2 0 (SUTURE) ×2
SUT SILK 2-0 18XBRD TIE 12 (SUTURE) ×1 IMPLANT
SUT SILK 3 0 SH CR/8 (SUTURE) ×1 IMPLANT
SUT VIC AB 2-0 CT1 27 (SUTURE) ×4
SUT VIC AB 2-0 CT1 TAPERPNT 27 (SUTURE) IMPLANT
SUT VIC AB 3-0 CT1 27 (SUTURE) ×2
SUT VIC AB 3-0 CT1 TAPERPNT 27 (SUTURE) IMPLANT
SUT VICRYL AB 3 0 TIES (SUTURE) IMPLANT
SYR CONTROL 10ML LL (SYRINGE) ×1 IMPLANT
TAPE CLOTH SURG 6X10 WHT LF (GAUZE/BANDAGES/DRESSINGS) ×1 IMPLANT
TISSUE MATRIX STRATTICE 16X20 (Tissue) ×2 IMPLANT
TISSUE MATRIX STRATTICE 20X16 (Tissue) IMPLANT
TOWEL OR 17X24 6PK STRL BLUE (TOWEL DISPOSABLE) ×2 IMPLANT
TOWEL OR 17X26 10 PK STRL BLUE (TOWEL DISPOSABLE) ×2 IMPLANT
TRAY FOLEY CATH 14FRSI W/METER (CATHETERS) ×1 IMPLANT

## 2012-01-29 NOTE — Anesthesia Preprocedure Evaluation (Addendum)
Anesthesia Evaluation  Patient identified by MRN, date of birth, ID band Patient awake    Airway Mallampati: II      Dental   Pulmonary asthma , COPD breath sounds clear to auscultation        Cardiovascular negative cardio ROS  Rhythm:Regular Rate:Normal     Neuro/Psych    GI/Hepatic Neg liver ROS, hiatal hernia, GERD-  ,  Endo/Other  diabetes  Renal/GU negative Renal ROS     Musculoskeletal   Abdominal   Peds  Hematology   Anesthesia Other Findings   Reproductive/Obstetrics                          Anesthesia Physical Anesthesia Plan  ASA: III  Anesthesia Plan: General   Post-op Pain Management:    Induction: Intravenous  Airway Management Planned: Oral ETT  Additional Equipment:   Intra-op Plan:   Post-operative Plan: Extubation in OR  Informed Consent: I have reviewed the patients History and Physical, chart, labs and discussed the procedure including the risks, benefits and alternatives for the proposed anesthesia with the patient or authorized representative who has indicated his/her understanding and acceptance.     Plan Discussed with: CRNA and Anesthesiologist  Anesthesia Plan Comments:       Anesthesia Quick Evaluation

## 2012-01-29 NOTE — Interval H&P Note (Signed)
History and Physical Interval Note:  01/29/2012 7:24 AM  [name]  has presented today for surgery, with the diagnosis of recurrent ventral hernia  The various methods of treatment have been discussed with the patient and family. After consideration of risks, benefits and other options for treatment, the patient has consented to  Procedure(s) (LRB) with comments: HERNIA REPAIR VENTRAL ADULT (N/A) - open recurrent ventral hernia repair with mesh INSERTION OF MESH (N/A) as a surgical intervention .  The patient's history has been reviewed, patient examined, no change in status, stable for surgery.  I have reviewed the patient's chart and labs.  Questions were answered to the patient's satisfaction.     [place] Pfund, Marta Lamas

## 2012-01-29 NOTE — Op Note (Signed)
OPERATIVE REPORT  DATE OF OPERATION: 01/29/2012  PATIENT:  [name]  78 y.o. male  PRE-OPERATIVE DIAGNOSIS:  recurrent ventral hernia  POST-OPERATIVE DIAGNOSIS:  recurrent ventral hernia  PROCEDURE:  Procedure(s): HERNIA REPAIR VENTRAL ADULT INSERTION OF MESH  SURGEON:  Surgeon(s): Cherylynn Ridges, MD Shelly Rubenstein, MD  ASSISTANT: Magnus Ivan  ANESTHESIA:   general  EBL: 250 ml  BLOOD ADMINISTERED: none  DRAINS: Urinary Catheter (Foley) and (2) Blake drain(s) in the subcutaneous space   SPECIMEN:  No Specimen  COUNTS CORRECT:  YES  PROCEDURE DETAILS: The patient was taken to the operating room and placed on the table in the supine position. After an adequate general endotracheal anesthetic was administered he was prepped and draped in usual sterile manner exposing his entire abdomen.  After proper time out was performed identifying the patient and the procedure to be performed midline incision was made using a #10 blade staying along the previous midline incision from his previous laparotomy.  The patient and his wife have remarked that he had a weakness' hernia in his left lower quadrant of his abdomen. After making the midline incision we delicately to down adhesions between the small bowel in the upper abdomen the colon in the upper abdomen and the omentum in the midportion of the abdomen using Metzenbaum scissors and electrocautery. Care was taken not to injure the bowel during this whole dissection process.  We will able to circumferentially dissect away all the adhesions. We were able to see the area in the left lower quadrant which had a very slight to mild weakness but no overt herniation and certainly no hernia sac. Along his midline he had multiple hernias including one just near the umbilicus which were repaired with a single repair.  We made subcutaneous flap circumferentially around the fascia. The patient had mesh placed in his upper abdomen from his  previous hernia repair. This was Pariatex mesh which we almost completely excised with this current repair.  Flaps were made in the subcutaneous tissue on top of the fascia circumferentially. The left lower quadrant weakness it was covered with a 3 x 5 cm rectangular piece of Strattice  porcine biologic prosthetic.  This was taken from a much larger piece of porcine prosthetic which was used to reinforce the midline wound. The porcine prosthetic was attached to the abdominal wall in the left lower quadrant using interrupted #1 Novafil sutures.  Once this was done the larger piece of biologic prosthetic was attached to the fascia circumferentially in the midline using interrupted using horizontal mattress sutures of #1 Novafil. A total of 16 set sutures were placed circumferentially. Care was taken not to injure or entrapped about her in the process. We irrigated this area with antibiotic solution after soaking the biologic prosthetic in saline.  Once it was implanted we reapproximated the midline fascia using interrupted #1 Novafil sutures. There was some redundancy of the fascia in the lower portion of the abdomen.  Two 19 mm Blake drains were placed in the subcutaneous tissue and brought out the lower portion of the abdomen on the left and right lateral aspect of the incision. There were secured in place using interrupted 3-0 nylon sutures. Once the drains were in place we reapproximated the subcutaneous tissue on top of the drains using a running stitch of 2-0 Vicryl. The skin was closed using stainless steel staples. All needle counts, sponge counts, and instrument counts were correct. A sterile dressing was applied including Betadine ointment, 4 x  4 gauze, and an ABD pad. An abdominal binder was placed.  PATIENT DISPOSITION:  PACU - hemodynamically stable.   Kei Mcelhiney O 12/13/201310:17 AM

## 2012-01-29 NOTE — Anesthesia Procedure Notes (Signed)
Procedure Name: Intubation Date/Time: 01/29/2012 7:39 AM Performed by: Gwenyth Allegra Pre-anesthesia Checklist: Patient identified, Timeout performed, Emergency Drugs available, Suction available and Patient being monitored Patient Re-evaluated:Patient Re-evaluated prior to inductionOxygen Delivery Method: Circle system utilized Preoxygenation: Pre-oxygenation with 100% oxygen Intubation Type: IV induction Ventilation: Mask ventilation without difficulty Laryngoscope Size: Mac and 4 Grade View: Grade I Tube type: Oral Number of attempts: 1 Airway Equipment and Method: Stylet Secured at: 22 cm Tube secured with: Tape Dental Injury: Teeth and Oropharynx as per pre-operative assessment

## 2012-01-29 NOTE — OR Nursing (Signed)
Soft bp 83/45 initially after dilausis bolus of 0.5mg  s/p move onto bed/ c/o pain afterwards, remains totally orient, not tachycardic., sats 100 on 2lnc.. Explained to RN will give LR for same and monitor

## 2012-01-29 NOTE — H&P (Signed)
  This patient is well known to me.  Had a previous laparoscopic upper abdominal ventral hernia repair which was initially successful, but has developed multiple midline incisional defects, all of which are easily reducible.  Also now complaining of weakness in the left lower abdominal wall, what may be an eventration, not actually a hernia.  Will inspect intraoperatively today.  The patient has developed multiple hernias along his midline fascia. We previously repaired an upper midline epigastric hernia laparoscopically. That one appears to have recurred along with a mid incisional hernia and one in the lower aspect of his midline incision from a previous splenectomy.  The patient is having more discomfort along the hernias. We like to have them repaired as soon as possible.  In order to repair his hernias which are reducible but tender we would have to do a complete wide opening of his hernia defects with primary repair with on underlying mesh. We will go ahead and schedule this as soon as possible.    No new problems.  Lungs are clear, abdomen is soft with reducible hernias.  Marta Lamas. Gae Bon, MD, FACS [phone] [facility]

## 2012-01-29 NOTE — OR Nursing (Signed)
93/55 after initiating  Fld bolus of , report to [employer]

## 2012-01-29 NOTE — Transfer of Care (Signed)
Immediate Anesthesia Transfer of Care Note  Patient: [name] Obst  Procedure(s) Performed: Procedure(s) (LRB) with comments: HERNIA REPAIR VENTRAL ADULT (N/A) - open recurrent ventral hernia repair with mesh INSERTION OF MESH (N/A)  Patient Location: PACU  Anesthesia Type:General  Level of Consciousness: awake and alert   Airway & Oxygen Therapy: Patient Spontanous Breathing and Patient connected to nasal cannula oxygen  Post-op Assessment: Report given to PACU RN and Post -op Vital signs reviewed and stable  Post vital signs: Reviewed and stable  Complications: No apparent anesthesia complications

## 2012-01-29 NOTE — Anesthesia Postprocedure Evaluation (Signed)
  Anesthesia Post-op Note  Patient: [name]  Procedure(s) Performed: Procedure(s) (LRB) with comments: HERNIA REPAIR VENTRAL ADULT (N/A) - open recurrent ventral hernia repair with mesh INSERTION OF MESH (N/A)  Patient Location: PACU  Anesthesia Type:General  Level of Consciousness: awake  Airway and Oxygen Therapy: Patient Spontanous Breathing  Post-op Pain: mild  Post-op Assessment: Post-op Vital signs reviewed  Post-op Vital Signs: Reviewed  Complications: No apparent anesthesia complications

## 2012-01-29 NOTE — Preoperative (Signed)
Beta Blockers   Reason not to administer Beta Blockers:Not Applicable 

## 2012-01-30 LAB — BASIC METABOLIC PANEL
Calcium: 8.2 mg/dL — ABNORMAL LOW (ref 8.4–10.5)
Chloride: 101 mEq/L (ref 96–112)
Creatinine, Ser: 0.94 mg/dL (ref 0.50–1.35)
GFR calc Af Amer: >90 mL/min (ref >90)
Sodium: 137 mEq/L (ref 135–145)

## 2012-01-30 LAB — CBC
MCV: 95.1 fL (ref 78.0–100.0)
Platelets: 336 10*3/uL (ref 150–400)
RDW: 14.5 % (ref 11.5–15.5)
WBC: 16.1 10*3/uL — ABNORMAL HIGH (ref 4.0–10.5)

## 2012-01-30 MED ORDER — SODIUM CHLORIDE 0.9 % IV SOLN
500.0000 mL | Freq: Once | INTRAVENOUS | Status: AC
  Administered 2012-01-30: 500 mL via INTRAVENOUS

## 2012-01-30 NOTE — Progress Notes (Signed)
Pt dangled on side of bed for 10 min. Night of surgery. Ilean Skill LPN

## 2012-01-30 NOTE — Progress Notes (Signed)
GS Progress Note Subjective: Patient doing okay.  Having a lot of pain.  More distended than expected, but not unusual.  No bowel sounds, but I did not remove his dressings or his binder.  Lot of eructation.  Objective: Vital signs in last 24 hours: Temp:  [97 F (36.1 C)-98.4 F (36.9 C)] 97.8 F (36.6 C) (12/14 0934) Pulse Rate:  [53-105] 93  (12/14 0934) Resp:  [13-24] 19  (12/14 0934) BP: (83-141)/(54-83) 118/80 mmHg (12/14 0934) SpO2:  [93 %-99 %] 97 % (12/14 0934) Weight:  [79.9 kg (176 lb 2.4 oz)] 79.9 kg (176 lb 2.4 oz) (12/13 1704) Last BM Date: 01/28/12  Intake/Output from previous day: 12/13 0701 - 12/14 0700 In: 3078.3 [P.O.:200; I.V.:2643.3] Out: 1297 [Urine:800; Drains:457; Blood:40] Intake/Output this shift: Total I/O In: 360 [P.O.:360] Out: -   Lungs: Clear  Abd: Distended, hypoactive bowel sounds.  Tender.Not changes,    Extremities: No DVT signs or symptoms  Neuro: Intact  Lab Results: CBC   Basename 01/30/12 0625  WBC 16.1*  HGB 12.0*  HCT 37.1*  PLT 336   BMET  Basename 01/30/12 0625  NA 137  K 4.5  CL 101  CO2 27  GLUCOSE 188*  BUN 18  CREATININE 0.94  CALCIUM 8.2*   PT/INR No results found for this basename: LABPROT:2,INR:2 in the last 72 hours ABG No results found for this basename: PHART:2,PCO2:2,PO2:2,HCO3:2 in the last 72 hours  Studies/Results: No results found.  Anti-infectives: Anti-infectives     Start     Dose/Rate Route Frequency Ordered Stop   01/29/12 1400   ceFAZolin (ANCEF) IVPB 1 g/50 mL premix        1 g 100 mL/hr over 30 Minutes Intravenous Every 6 hours 01/29/12 1139 01/30/12 0222   01/29/12 0806   polymyxin B 500,000 Units, bacitracin 50,000 Units in sodium chloride irrigation 0.9 % 500 mL irrigation  Status:  Discontinued          As needed 01/29/12 0806 01/29/12 1017   01/29/12 0600   ceFAZolin (ANCEF) IVPB 2 g/50 mL premix        2 g 100 mL/hr over 30 Minutes Intravenous On call to O.R. 01/28/12  1454 01/29/12 0730          Assessment/Plan: s/p Procedure(s): HERNIA REPAIR VENTRAL ADULT INSERTION OF MESH Continue foley due to urinary output monitoring Stay on clear liquids. Normal saline bolus.   LOS: 1 day    Marta Lamas. Gae Bon, MD, FACS [phone] [facility] 01/30/2012

## 2012-01-31 LAB — BASIC METABOLIC PANEL
Chloride: 98 mEq/L (ref 96–112)
GFR calc Af Amer: >90 mL/min (ref >90)
Potassium: 5.1 mEq/L (ref 3.5–5.1)

## 2012-01-31 LAB — CBC
HCT: 30.6 % — ABNORMAL LOW (ref 39.0–52.0)
Platelets: 303 10*3/uL (ref 150–400)
RDW: 14.1 % (ref 11.5–15.5)
WBC: 18.1 10*3/uL — ABNORMAL HIGH (ref 4.0–10.5)

## 2012-01-31 MED ORDER — PANTOPRAZOLE SODIUM 40 MG IV SOLR
40.0000 mg | Freq: Every day | INTRAVENOUS | Status: DC
  Administered 2012-01-31 – 2012-02-01 (×2): 40 mg via INTRAVENOUS
  Filled 2012-01-31 (×4): qty 40

## 2012-01-31 MED ORDER — DEXTROSE-NACL 5-0.45 % IV SOLN
INTRAVENOUS | Status: DC
  Administered 2012-01-31 – 2012-02-01 (×3): via INTRAVENOUS

## 2012-01-31 NOTE — Progress Notes (Signed)
GS Progress Note Subjective: Patient seems a bit fidgety, maybe even a bit confused, possibly the pain medication.  Vital are okay.  Says his pain is less today.    Objective: Vital signs in last 24 hours: Temp:  [97.5 F (36.4 C)-98.9 F (37.2 C)] 98.9 F (37.2 C) (12/15 0610) Pulse Rate:  [92-101] 92  (12/15 0610) Resp:  [16-23] 20  (12/15 0610) BP: (118-146)/(69-80) 146/69 mmHg (12/15 0610) SpO2:  [94 %-100 %] 97 % (12/15 0610) Last BM Date: 01/28/12  Intake/Output from previous day: 12/14 0701 - 12/15 0700 In: 2160 [P.O.:840; I.V.:1300] Out: 565 [Urine:500; Drains:65] Intake/Output this shift:    Lungs: Clear  Abd: Firm but no peritonitis.  Most of his tenderness is on his right side.  Binder in place.  Extremities: No DVT signs or symptoms.  Neuro: Seems to be a bit confused.  Lab Results: CBC   Basename 01/30/12 0625  WBC 16.1*  HGB 12.0*  HCT 37.1*  PLT 336   BMET  Basename 01/31/12 0650 01/30/12 0625  NA 133* 137  K 5.1 4.5  CL 98 101  CO2 27 27  GLUCOSE 164* 188*  BUN 9 18  CREATININE 0.66 0.94  CALCIUM 7.6* 8.2*   PT/INR No results found for this basename: LABPROT:2,INR:2 in the last 72 hours ABG No results found for this basename: PHART:2,PCO2:2,PO2:2,HCO3:2 in the last 72 hours  Studies/Results: No results found.  Anti-infectives: Anti-infectives     Start     Dose/Rate Route Frequency Ordered Stop   01/29/12 1400   ceFAZolin (ANCEF) IVPB 1 g/50 mL premix        1 g 100 mL/hr over 30 Minutes Intravenous Every 6 hours 01/29/12 1139 01/30/12 0222   01/29/12 0806   polymyxin B 500,000 Units, bacitracin 50,000 Units in sodium chloride irrigation 0.9 % 500 mL irrigation  Status:  Discontinued          As needed 01/29/12 0806 01/29/12 1017   01/29/12 0600   ceFAZolin (ANCEF) IVPB 2 g/50 mL premix        2 g 100 mL/hr over 30 Minutes Intravenous On call to O.R. 01/28/12 1454 01/29/12 0730          Assessment/Plan: s/p  Procedure(s): HERNIA REPAIR VENTRAL ADULT INSERTION OF MESH d/c foley Change IVFs Mild hyperkalemia Check CBC and Bmet tomorrow AM. Hgb dropped 2 gms from yesterday. No fever.  LOS: 2 days    Marta Lamas. Gae Bon, MD, FACS [phone] [facility] 01/31/2012

## 2012-02-01 ENCOUNTER — Encounter (HOSPITAL_COMMUNITY): Payer: Self-pay | Admitting: General Surgery

## 2012-02-01 LAB — CBC WITH DIFFERENTIAL/PLATELET
Basophils Absolute: 0 10*3/uL (ref 0.0–0.1)
HCT: 29.7 % — ABNORMAL LOW (ref 39.0–52.0)
Hemoglobin: 9.9 g/dL — ABNORMAL LOW (ref 13.0–17.0)
Lymphocytes Relative: 14 % (ref 12–46)
Lymphs Abs: 2.3 10*3/uL (ref 0.7–4.0)
Monocytes Absolute: 1.9 10*3/uL — ABNORMAL HIGH (ref 0.1–1.0)
Neutro Abs: 11.8 10*3/uL — ABNORMAL HIGH (ref 1.7–7.7)
RBC: 3.14 MIL/uL — ABNORMAL LOW (ref 4.22–5.81)
RDW: 14 % (ref 11.5–15.5)
WBC: 16 10*3/uL — ABNORMAL HIGH (ref 4.0–10.5)

## 2012-02-01 LAB — BASIC METABOLIC PANEL
CO2: 30 mEq/L (ref 19–32)
Chloride: 99 mEq/L (ref 96–112)
Creatinine, Ser: 0.65 mg/dL (ref 0.50–1.35)

## 2012-02-01 MED ORDER — METOCLOPRAMIDE HCL 5 MG/ML IJ SOLN
5.0000 mg | Freq: Three times a day (TID) | INTRAMUSCULAR | Status: DC
  Administered 2012-02-01 – 2012-02-02 (×4): 5 mg via INTRAVENOUS
  Filled 2012-02-01 (×5): qty 1

## 2012-02-01 NOTE — Progress Notes (Signed)
GS Progress Note Subjective: Feeling much better today, does not appear to be confused.  Objective: Vital signs in last 24 hours: Temp:  [98.5 F (36.9 C)-99.1 F (37.3 C)] 99 F (37.2 C) (12/16 0600) Pulse Rate:  [87-98] 87  (12/16 0600) Resp:  [17-24] 18  (12/16 0757) BP: (136-149)/(66-75) 136/71 mmHg (12/16 0600) SpO2:  [91 %-97 %] 97 % (12/16 0757) Last BM Date: 01/28/12  Intake/Output from previous day: 12/15 0701 - 12/16 0700 In: 1270 [P.O.:120; I.V.:1150] Out: 1580 [Urine:1550; Drains:30] Intake/Output this shift:    Lungs: Clear  Abd: Some bowel sounds.  Still a bit distended, Burping a bit still.  Extremities: N DVT signs or symptoms  Neuro: Intact  Lab Results: CBC   Basename 02/01/12 0803 01/31/12 0650  WBC 16.0* 18.1*  HGB 9.9* 9.9*  HCT 29.7* 30.6*  PLT 310 303   BMET  Basename 01/31/12 0650 01/30/12 0625  NA 133* 137  K 5.1 4.5  CL 98 101  CO2 27 27  GLUCOSE 164* 188*  BUN 9 18  CREATININE 0.66 0.94  CALCIUM 7.6* 8.2*   PT/INR No results found for this basename: LABPROT:2,INR:2 in the last 72 hours ABG No results found for this basename: PHART:2,PCO2:2,PO2:2,HCO3:2 in the last 72 hours  Studies/Results: No results found.  Anti-infectives: Anti-infectives     Start     Dose/Rate Route Frequency Ordered Stop   01/29/12 1400   ceFAZolin (ANCEF) IVPB 1 g/50 mL premix        1 g 100 mL/hr over 30 Minutes Intravenous Every 6 hours 01/29/12 1139 01/30/12 0222   01/29/12 0806   polymyxin B 500,000 Units, bacitracin 50,000 Units in sodium chloride irrigation 0.9 % 500 mL irrigation  Status:  Discontinued          As needed 01/29/12 0806 01/29/12 1017   01/29/12 0600   ceFAZolin (ANCEF) IVPB 2 g/50 mL premix        2 g 100 mL/hr over 30 Minutes Intravenous On call to O.R. 01/28/12 1454 01/29/12 0730          Assessment/Plan: s/p Procedure(s): HERNIA REPAIR VENTRAL ADULT INSERTION OF MESH Advance diet Will start sips of clear  liquids. Will not check CBC tomorrowl Trial of Reglan.  LOS: 3 days    Marta Lamas. Gae Bon, MD, FACS [phone] [facility] 02/01/2012

## 2012-02-01 NOTE — Plan of Care (Signed)
Problem: Phase I Progression Outcomes Goal: Voiding-avoid urinary catheter unless indicated Outcome: Completed/Met Date Met:  02/01/12 Foley d/c'd

## 2012-02-02 MED ORDER — KETOROLAC TROMETHAMINE 30 MG/ML IJ SOLN
30.0000 mg | Freq: Once | INTRAMUSCULAR | Status: AC
  Administered 2012-02-02: 30 mg via INTRAVENOUS
  Filled 2012-02-02: qty 1

## 2012-02-02 MED ORDER — HYDROMORPHONE HCL PF 1 MG/ML IJ SOLN: 0.5000 mg | Freq: Four times a day (QID) | INTRAMUSCULAR | Status: DC | PRN

## 2012-02-02 MED ORDER — PANTOPRAZOLE SODIUM 40 MG PO TBEC
40.0000 mg | DELAYED_RELEASE_TABLET | Freq: Every day | ORAL | Status: DC
  Administered 2012-02-02 – 2012-02-03 (×2): 40 mg via ORAL
  Filled 2012-02-02 (×2): qty 1

## 2012-02-02 MED ORDER — KETOROLAC TROMETHAMINE 15 MG/ML IJ SOLN
15.0000 mg | Freq: Four times a day (QID) | INTRAMUSCULAR | Status: AC
  Administered 2012-02-02 – 2012-02-04 (×8): 15 mg via INTRAVENOUS
  Filled 2012-02-02 (×8): qty 1

## 2012-02-02 MED ORDER — OXYCODONE-ACETAMINOPHEN 5-325 MG PO TABS
1.0000 | ORAL_TABLET | ORAL | Status: DC | PRN
  Administered 2012-02-02: 1 via ORAL
  Filled 2012-02-02: qty 1

## 2012-02-02 MED ORDER — METOCLOPRAMIDE HCL 5 MG PO TABS
5.0000 mg | ORAL_TABLET | Freq: Three times a day (TID) | ORAL | Status: DC
  Administered 2012-02-02 – 2012-02-04 (×5): 5 mg via ORAL
  Filled 2012-02-02 (×8): qty 1

## 2012-02-02 NOTE — Progress Notes (Signed)
GS Progress Note Subjective: Patient doing better today, but still no BM or flatus.  Objective: Vital signs in last 24 hours: Temp:  [97.5 F (36.4 C)-99.2 F (37.3 C)] 97.5 F (36.4 C) (12/17 0526) Pulse Rate:  [79-91] 83  (12/17 0526) Resp:  [15-20] 20  (12/17 0526) BP: (132-152)/(55-81) 151/77 mmHg (12/17 0526) SpO2:  [93 %-98 %] 95 % (12/17 0526) Last BM Date: 01/28/12  Intake/Output from previous day: 12/16 0701 - 12/17 0700 In: 965 [P.O.:120; I.V.:845] Out: 2600 [Urine:2575; Drains:25] Intake/Output this shift:    Lungs: Clear  Abd: Some bowel sounds, but hypoactive.  No tenderness, no peritonitis  Extremities: No DVT signs or symptoms  Neuro: Intact  Lab Results: CBC   Basename 02/01/12 0803 01/31/12 0650  WBC 16.0* 18.1*  HGB 9.9* 9.9*  HCT 29.7* 30.6*  PLT 310 303   BMET  Basename 02/01/12 0803 01/31/12 0650  NA 135 133*  K 4.0 5.1  CL 99 98  CO2 30 27  GLUCOSE 132* 164*  BUN 7 9  CREATININE 0.65 0.66  CALCIUM 8.3* 7.6*   PT/INR No results found for this basename: LABPROT:2,INR:2 in the last 72 hours ABG No results found for this basename: PHART:2,PCO2:2,PO2:2,HCO3:2 in the last 72 hours  Studies/Results: No results found.  Anti-infectives: Anti-infectives     Start     Dose/Rate Route Frequency Ordered Stop   01/29/12 1400   ceFAZolin (ANCEF) IVPB 1 g/50 mL premix        1 g 100 mL/hr over 30 Minutes Intravenous Every 6 hours 01/29/12 1139 01/30/12 0222   01/29/12 0806   polymyxin B 500,000 Units, bacitracin 50,000 Units in sodium chloride irrigation 0.9 % 500 mL irrigation  Status:  Discontinued          As needed 01/29/12 0806 01/29/12 1017   01/29/12 0600   ceFAZolin (ANCEF) IVPB 2 g/50 mL premix        2 g 100 mL/hr over 30 Minutes Intravenous On call to O.R. 01/28/12 1454 01/29/12 0730          Assessment/Plan: s/p Procedure(s): HERNIA REPAIR VENTRAL ADULT INSERTION OF MESH Advance diet DC PCA Add Toradol Full liquid  diet. Oral Reglan  LOS: 4 days    Marta Lamas. Gae Bon, MD, FACS [phone] [facility] 02/02/2012

## 2012-02-03 LAB — CBC WITH DIFFERENTIAL/PLATELET
Eosinophils Relative: 4 % (ref 0–5)
HCT: 29.8 % — ABNORMAL LOW (ref 39.0–52.0)
Lymphocytes Relative: 19 % (ref 12–46)
Lymphs Abs: 2.6 10*3/uL (ref 0.7–4.0)
MCV: 93.1 fL (ref 78.0–100.0)
Monocytes Absolute: 1.8 10*3/uL — ABNORMAL HIGH (ref 0.1–1.0)
RBC: 3.2 MIL/uL — ABNORMAL LOW (ref 4.22–5.81)
RDW: 14.1 % (ref 11.5–15.5)
WBC: 13.7 10*3/uL — ABNORMAL HIGH (ref 4.0–10.5)

## 2012-02-03 LAB — BASIC METABOLIC PANEL
BUN: 14 mg/dL (ref 6–23)
CO2: 30 mEq/L (ref 19–32)
Calcium: 8.4 mg/dL (ref 8.4–10.5)
Creatinine, Ser: 0.75 mg/dL (ref 0.50–1.35)
Glucose, Bld: 119 mg/dL — ABNORMAL HIGH (ref 70–99)

## 2012-02-03 MED ORDER — SODIUM CHLORIDE 0.9 % IJ SOLN: 3.0000 mL | INTRAMUSCULAR | Status: DC | PRN

## 2012-02-03 NOTE — Progress Notes (Signed)
GS Progress Note Subjective: Doing well.  No bowel movement but has passed gas  Objective: Vital signs in last 24 hours: Temp:  [97.6 F (36.4 C)-98.2 F (36.8 C)] 98.2 F (36.8 C) (12/18 0544) Pulse Rate:  [86-94] 86  (12/18 0544) Resp:  [18-20] 18  (12/18 0544) BP: (136-149)/(77-83) 136/83 mmHg (12/18 0544) SpO2:  [96 %-98 %] 96 % (12/18 0544) Last BM Date: 01/28/12  Intake/Output from previous day: 12/17 0701 - 12/18 0700 In: 900 [P.O.:720; I.V.:180] Out: 835 [Urine:800; Drains:35] Intake/Output this shift:    Lungs: Clear  Abd: Soft, good bowel sounds  Extremities: No DVT signs or symptoms  Neuro: Intact  Lab Results: CBC   Basename 02/03/12 0615 02/01/12 0803  WBC 13.7* 16.0*  HGB 9.9* 9.9*  HCT 29.8* 29.7*  PLT 418* 310   BMET  Basename 02/03/12 0615 02/01/12 0803  NA 137 135  K 3.9 4.0  CL 101 99  CO2 30 30  GLUCOSE 119* 132*  BUN 14 7  CREATININE 0.75 0.65  CALCIUM 8.4 8.3*   PT/INR No results found for this basename: LABPROT:2,INR:2 in the last 72 hours ABG No results found for this basename: PHART:2,PCO2:2,PO2:2,HCO3:2 in the last 72 hours  Studies/Results: No results found.  Anti-infectives: Anti-infectives     Start     Dose/Rate Route Frequency Ordered Stop   01/29/12 1400   ceFAZolin (ANCEF) IVPB 1 g/50 mL premix        1 g 100 mL/hr over 30 Minutes Intravenous Every 6 hours 01/29/12 1139 01/30/12 0222   01/29/12 0806   polymyxin B 500,000 Units, bacitracin 50,000 Units in sodium chloride irrigation 0.9 % 500 mL irrigation  Status:  Discontinued          As needed 01/29/12 0806 01/29/12 1017   01/29/12 0600   ceFAZolin (ANCEF) IVPB 2 g/50 mL premix        2 g 100 mL/hr over 30 Minutes Intravenous On call to O.R. 01/28/12 1454 01/29/12 0730          Assessment/Plan: s/p Procedure(s): HERNIA REPAIR VENTRAL ADULT INSERTION OF MESH Advance diet Plan for discharge tomorrow Saline lock IV  LOS: 5 days    Marta Lamas. Gae Bon, MD, FACS [phone] [facility] 02/03/2012

## 2012-02-04 MED ORDER — OXYCODONE-ACETAMINOPHEN 5-325 MG PO TABS: 1.0000 | ORAL_TABLET | ORAL | Status: DC | PRN

## 2012-02-04 MED ORDER — METOCLOPRAMIDE HCL 5 MG PO TABS: 5.0000 mg | ORAL_TABLET | Freq: Three times a day (TID) | ORAL | Status: DC

## 2012-02-04 MED ORDER — ZOLPIDEM TARTRATE 5 MG PO TABS: 5.0000 mg | ORAL_TABLET | Freq: Every evening | ORAL | Status: DC | PRN

## 2012-02-04 MED ORDER — BISACODYL 10 MG RE SUPP
10.0000 mg | Freq: Once | RECTAL | Status: AC
  Administered 2012-02-04: 10 mg via RECTAL
  Filled 2012-02-04: qty 1

## 2012-02-04 NOTE — Discharge Summary (Signed)
Physician Discharge Summary  Patient ID: [name] MRN: [id number] DOB/AGE: 20-Dec-1931 78 y.o.  Admit date: 01/29/2012 Discharge date: 02/04/2012  Admission Diagnoses:  Discharge Diagnoses:  Active Problems:  * No active hospital problems. *    Discharged Condition: good  Hospital Course: Admitted after complex incisional/ventral hernia repair.  Ileus immediately postop which has resolved.  Some mild confusion early postop.  Leukocytosis early postop has resolved.  Consults: None  Significant Diagnostic Studies: labs: cbc  Treatments: IV hydration, antibiotics: Ancef and surgery: ventral hernia repair with biological prosthetic  Discharge Exam: Blood pressure 131/71, pulse 97, temperature 98.7 F (37.1 C), temperature source Oral, resp. rate 18, height 5\' 5"  (1.651 m), weight 79.9 kg (176 lb 2.4 oz), SpO2 97.00%. General appearance: alert, cooperative and no distress GI: soft, non-tender; bowel sounds normal; no masses,  no organomegaly and wound is okay without erythema.  Last drain removed today.  Disposition:      Medication List     As of 02/04/2012  6:52 AM    ASK your doctor about these medications         CITRUCEL oral powder   Generic drug: methylcellulose   Take 1 packet by mouth daily.      clopidogrel 75 MG tablet   Commonly known as: PLAVIX   Take 75 mg by mouth daily.      dicyclomine 10 MG capsule   Commonly known as: BENTYL   Take 10 mg by mouth 2 (two) times daily.      fluticasone-salmeterol 115-21 MCG/ACT inhaler   Commonly known as: ADVAIR HFA   Inhale 2 puffs into the lungs 2 (two) times daily.      Magnesium Gluconate 500 (27 MG) MG Tabs   Take 500 mg by mouth at bedtime.      multivitamin with minerals Tabs   Take 1 tablet by mouth daily.      simvastatin 20 MG tablet   Commonly known as: ZOCOR   Take 20 mg by mouth every evening.      zolpidem 10 MG tablet   Commonly known as: AMBIEN   Take 10 mg by mouth at bedtime as  needed. For sleep           Follow-up Information    Follow up with Brennan Bailey, MA. On 02/08/2012. (for staple removal)    Contact information:   [address] [place] [address]       Follow up with Cherylynn Ridges, MD. In 2 weeks.   Contact information:   [address] [place] [address] [phone]          Signed: Cherylynn Ridges 02/04/2012, 6:52 AM

## 2012-02-08 ENCOUNTER — Ambulatory Visit (INDEPENDENT_AMBULATORY_CARE_PROVIDER_SITE_OTHER): Payer: Medicare Other

## 2012-02-08 DIAGNOSIS — Z4802 Encounter for removal of sutures: Secondary | ICD-10-CM

## 2012-02-08 DIAGNOSIS — K529 Noninfective gastroenteritis and colitis, unspecified: Secondary | ICD-10-CM

## 2012-02-08 DIAGNOSIS — R197 Diarrhea, unspecified: Secondary | ICD-10-CM

## 2012-02-08 LAB — CBC
HCT: 31.2 % — ABNORMAL LOW (ref 39.0–52.0)
MCH: 31.3 pg (ref 26.0–34.0)
MCHC: 34.3 g/dL (ref 30.0–36.0)
MCV: 91.2 fL (ref 78.0–100.0)
Platelets: 763 10*3/uL — ABNORMAL HIGH (ref 150–400)
RDW: 14.4 % (ref 11.5–15.5)
WBC: 15.4 10*3/uL — ABNORMAL HIGH (ref 4.0–10.5)

## 2012-02-08 LAB — COMPREHENSIVE METABOLIC PANEL
ALT: 27 U/L (ref 0–53)
AST: 23 U/L (ref 0–37)
Alkaline Phosphatase: 120 U/L — ABNORMAL HIGH (ref 39–117)
BUN: 13 mg/dL (ref 6–23)
Calcium: 8.5 mg/dL (ref 8.4–10.5)
Creat: 0.74 mg/dL (ref 0.50–1.35)
Total Bilirubin: 0.8 mg/dL (ref 0.3–1.2)

## 2012-02-08 NOTE — Progress Notes (Signed)
Patient came in for staple removal from hernia repair on 12/13. Patients incision looks good, no signs of infection. I removed all staples and applied steri strips. Per Dr Lindie Spruce I put orders in for CBC, CMET and c diff toxin titer since patient has had diarrhea and blood in stool for a few days now. Orders put in and we will follow up with results. He is scheduled to follow up with Dr Lindie Spruce next week.

## 2012-02-16 ENCOUNTER — Ambulatory Visit (INDEPENDENT_AMBULATORY_CARE_PROVIDER_SITE_OTHER): Payer: Medicare Other | Admitting: General Surgery

## 2012-02-16 ENCOUNTER — Encounter (INDEPENDENT_AMBULATORY_CARE_PROVIDER_SITE_OTHER): Payer: Self-pay | Admitting: General Surgery

## 2012-02-16 VITALS — BP 122/70 | HR 76 | Temp 97.6°F | Resp 14 | Ht 70.0 in | Wt 163.8 lb

## 2012-02-16 DIAGNOSIS — Z09 Encounter for follow-up examination after completed treatment for conditions other than malignant neoplasm: Secondary | ICD-10-CM

## 2012-02-16 NOTE — Progress Notes (Signed)
The patient is doing well status post open large ventral hernia repair with a biologic prosthetic.  The wound is healed well with no evidence of infection. He has no fluid drainage. There is no erythema. There is no evidence of recurrent hernia.  The patient is to take to complete 6 weeks off from any heavy lifting or heavy exercise. He is not to play golf for the next 3 weeks. It is okay for him to travel later in January. Have a planned trip to [place] on January 22.  He is to return to see me on a when necessary basis.

## 2013-06-16 ENCOUNTER — Other Ambulatory Visit: Payer: Self-pay | Admitting: *Deleted

## 2013-06-16 DIAGNOSIS — R202 Paresthesia of skin: Secondary | ICD-10-CM

## 2013-06-19 ENCOUNTER — Ambulatory Visit (INDEPENDENT_AMBULATORY_CARE_PROVIDER_SITE_OTHER): Payer: Medicare Other | Admitting: Neurology

## 2013-06-19 ENCOUNTER — Encounter: Payer: Self-pay | Admitting: Neurology

## 2013-06-19 DIAGNOSIS — R202 Paresthesia of skin: Secondary | ICD-10-CM

## 2013-06-19 DIAGNOSIS — R209 Unspecified disturbances of skin sensation: Secondary | ICD-10-CM

## 2013-06-19 DIAGNOSIS — G5620 Lesion of ulnar nerve, unspecified upper limb: Secondary | ICD-10-CM

## 2013-06-19 DIAGNOSIS — G5621 Lesion of ulnar nerve, right upper limb: Secondary | ICD-10-CM

## 2013-06-19 DIAGNOSIS — M5412 Radiculopathy, cervical region: Secondary | ICD-10-CM

## 2013-06-19 NOTE — Procedures (Signed)
[facility]  [place], [address]  [place], [place] [address] Tel: [phone] Fax:  [phone] Test Date:  06/19/2013  Patient: [name] DOB: 08/25/1949 Physician: Narda Amber, DO  Sex: Male Height: 5' 10.5" Ref Phys: Nehemiah Settle  ID#: [id number] Temp: 35.0C Technician: Laureen Ochs R. NCS T.   Patient Complaints: 80 year old male with bilateral hand paresthesias and weakness, worse on the right side.  NCV & EMG Findings: Extensive electrodiagnostic testing of the right upper extremity and additional studies of the left reveals:  1. Bilateral median, bilateral ulnar, and the right radial sensory responses are all within normal limits. Bilateral palmar studies showed borderline normal latency of the median nerve. 2. The right ulnar motor response shows slowed conduction velocity across the elbow (38 m/s) with preserved amplitudes. The right median motor response and left ulnar motor response is within normal limits. 3. Chronic motor axonal loss changes are seen affecting the right first dorsal interosseus muscle, flexor digitorum profundus 4&5, pronator teres, and triceps muscles.  On the left side, there is chronic motor axonal loss changes affecting C8-myotomes. There is no evidence of active ongoing denervation.  Impression: 1. Right ulnar neuropathy across the elbow, demyelinating and axonal loss in type. Overall, these findings are mild in degree electrically. 2. Right cervical radiculopathy affecting the C7-C8 nerve root/segments, mild in degree electrically 3. Left cervical radiculopathy affecting the C8 nerve root/segment, mild in degree electrically. 4. There is no definite evidence of carpal tunnel syndrome affecting the upper extremities.   ___________________________ Narda Amber, DO    Nerve Conduction Studies Anti Sensory Summary Table   Site NR Peak (ms) Norm Peak (ms) P-T Amp (V) Norm P-T Amp  Left Median Anti Sensory (2nd Digit)  35C    Wrist    3.4 <3.8 16.9 >10  Right Median Anti Sensory (2nd Digit)  Wrist    3.5 <3.8 15.4 >10  Right Radial Anti Sensory (Base 1st Digit)  Wrist    2.3 <2.8 21.6 >10  Left Ulnar Anti Sensory (5th Digit)  35C  Wrist    2.8 <3.2 14.3 >5  Right Ulnar Anti Sensory (5th Digit)  Wrist    2.8 <3.2 14.3 >5   Motor Summary Table   Site NR Onset (ms) Norm Onset (ms) O-P Amp (mV) Norm O-P Amp Site1 Site2 Delta-0 (ms) Dist (cm) Vel (m/s) Norm Vel (m/s)  Right Median Motor (Abd Poll Brev)  Wrist    3.3 <4.0 7.5 >5 Elbow Wrist 5.3 27.0 51 >50  Elbow    8.6  7.5         Left Ulnar Motor (Abd Dig Minimi)  35C  Wrist    2.7 <3.1 9.5 >7 B Elbow Wrist 4.8 24.0 50 >50  B Elbow    7.5  7.2  A Elbow B Elbow 2.0 10.0 50 >50  A Elbow    9.5  7.2         Right Ulnar Motor (Abd Dig Minimi)  Wrist    2.7 <3.1 8.6 >7 B Elbow Wrist 4.8 24.0 50 >50  B Elbow    7.5  8.3  A Elbow B Elbow 2.6 10.0 38 >50  A Elbow    10.1  8.0          Comparison Summary Table   Site NR Peak (ms) Norm Peak (ms) P-T Amp (V) Site1 Site2 Delta-P (ms) Norm Delta (ms)  Left Median/Ulnar Palm Comparison (Wrist - 8cm)  35C  Median Palm    2.2 <2.2 32.5 Median Palm Ulnar Palm 0.2   Ulnar Palm    2.0 <2.2 13.1      Right Median/Ulnar Palm Comparison (Wrist - 8cm)  Median Palm    2.2 <2.2 70.1 Median Palm Ulnar Palm 0.1   Ulnar Palm    2.1 <2.2 12.6       EMG   Side Muscle Ins Act Fibs Psw Fasc Number Recrt Dur Dur. Amp Amp. Poly Poly. Comment  Left FlexPolLong Nml Nml Nml Nml 1- Mod-R Few 1+ Nml Nml Nml Nml N/A  Left 1stDorInt Nml Nml Nml Nml 1- Mod Few 1+ Nml Nml Nml Nml N/A  Left Triceps Nml Nml Nml Nml 1- Mod-R Some 1+ Few 1+ Nml Nml N/A  Left Biceps Nml Nml Nml Nml Nml Nml Nml Nml Nml Nml Nml Nml N/A  Left PronatorTeres Nml Nml Nml Nml Nml Nml Nml Nml Nml Nml Nml Nml N/A  Right FlexDigProf 4,5 Nml Nml Nml Nml 1- Rapid Some 1+ Some 1+ Few 1+ N/A  Right 1stDorInt Nml Nml Nml Nml 1- Mod-R Few 1+ Few 1+ Few 1+ N/A  Right Ext  Indicis Nml Nml Nml Nml 1- Mod Few 1+ Nml Nml Nml Nml N/A  Right PronatorTeres Nml Nml Nml Nml 1- Mod-R Few 1+ Nml Nml Nml Nml N/A  Right Triceps Nml Nml Nml Nml 2- Rapid Some 1+ Some 1+ Some 1+ N/A  Right FlexPolLong Nml Nml Nml Nml Nml Nml Nml Nml Nml Nml Nml Nml N/A  Right Biceps Nml Nml Nml Nml Nml Nml Nml Nml Nml Nml Nml Nml N/A  Right Deltoid Nml Nml Nml Nml Nml Nml Nml Nml Nml Nml Nml Nml N/A      Waveforms:

## 2013-06-19 NOTE — Progress Notes (Signed)
See procedure note for EMG results.  Ronak Duquette K. Lynia Landry, DO  

## 2013-11-15 ENCOUNTER — Encounter: Payer: Self-pay | Admitting: Gastroenterology

## 2013-12-22 ENCOUNTER — Observation Stay (HOSPITAL_COMMUNITY): Payer: Medicare Other

## 2013-12-22 ENCOUNTER — Encounter (HOSPITAL_COMMUNITY): Payer: Self-pay | Admitting: Emergency Medicine

## 2013-12-22 ENCOUNTER — Inpatient Hospital Stay (HOSPITAL_COMMUNITY)
Admission: EM | Admit: 2013-12-22 | Discharge: 2013-12-23 | DRG: 066 | Disposition: A | Discharge status: Home / Self Care | Payer: Medicare Other | Attending: Internal Medicine | Admitting: Internal Medicine

## 2013-12-22 ENCOUNTER — Emergency Department (HOSPITAL_COMMUNITY): Payer: Medicare Other

## 2013-12-22 DIAGNOSIS — E119 Type 2 diabetes mellitus without complications: Secondary | ICD-10-CM

## 2013-12-22 DIAGNOSIS — Z7951 Long term (current) use of inhaled steroids: Secondary | ICD-10-CM

## 2013-12-22 DIAGNOSIS — K219 Gastro-esophageal reflux disease without esophagitis: Secondary | ICD-10-CM | POA: Diagnosis present

## 2013-12-22 DIAGNOSIS — I639 Cerebral infarction, unspecified: Secondary | ICD-10-CM | POA: Diagnosis not present

## 2013-12-22 DIAGNOSIS — Z7902 Long term (current) use of antithrombotics/antiplatelets: Secondary | ICD-10-CM

## 2013-12-22 DIAGNOSIS — Z79899 Other long term (current) drug therapy: Secondary | ICD-10-CM

## 2013-12-22 DIAGNOSIS — G459 Transient cerebral ischemic attack, unspecified: Secondary | ICD-10-CM | POA: Diagnosis present

## 2013-12-22 DIAGNOSIS — J449 Chronic obstructive pulmonary disease, unspecified: Secondary | ICD-10-CM | POA: Diagnosis present

## 2013-12-22 DIAGNOSIS — R4781 Slurred speech: Secondary | ICD-10-CM | POA: Diagnosis present

## 2013-12-22 DIAGNOSIS — Z8673 Personal history of transient ischemic attack (TIA), and cerebral infarction without residual deficits: Secondary | ICD-10-CM

## 2013-12-22 DIAGNOSIS — J45909 Unspecified asthma, uncomplicated: Secondary | ICD-10-CM | POA: Diagnosis present

## 2013-12-22 DIAGNOSIS — E785 Hyperlipidemia, unspecified: Secondary | ICD-10-CM | POA: Diagnosis present

## 2013-12-22 DIAGNOSIS — R569 Unspecified convulsions: Secondary | ICD-10-CM | POA: Insufficient documentation

## 2013-12-22 HISTORY — DX: Type 2 diabetes mellitus without complications: E11.9

## 2013-12-22 LAB — APTT: APTT: 28 s (ref 24–37)

## 2013-12-22 LAB — I-STAT TROPONIN, ED: Troponin i, poc: 0 ng/mL (ref 0.00–0.08)

## 2013-12-22 LAB — CBC
HCT: 50.2 % (ref 39.0–52.0)
Hemoglobin: 16.8 g/dL (ref 13.0–17.0)
MCH: 32.4 pg (ref 26.0–34.0)
MCHC: 33.5 g/dL (ref 30.0–36.0)
MCV: 96.9 fL (ref 78.0–100.0)
PLATELETS: 480 10*3/uL — AB (ref 150–400)
RBC: 5.18 MIL/uL (ref 4.22–5.81)
RDW: 14.1 % (ref 11.5–15.5)
WBC: 10.3 10*3/uL (ref 4.0–10.5)

## 2013-12-22 LAB — URINE MICROSCOPIC-ADD ON

## 2013-12-22 LAB — URINALYSIS, ROUTINE W REFLEX MICROSCOPIC
Bilirubin Urine: NEGATIVE
Glucose, UA: NEGATIVE mg/dL
Hgb urine dipstick: NEGATIVE
Ketones, ur: NEGATIVE mg/dL
LEUKOCYTES UA: NEGATIVE
Nitrite: NEGATIVE
Protein, ur: NEGATIVE mg/dL
Specific Gravity, Urine: 1.015 (ref 1.005–1.030)
Urobilinogen, UA: 0.2 mg/dL (ref 0.0–1.0)
pH: 8 (ref 5.0–8.0)

## 2013-12-22 LAB — COMPREHENSIVE METABOLIC PANEL
ALBUMIN: 3.5 g/dL (ref 3.5–5.2)
ALK PHOS: 88 U/L (ref 39–117)
ALT: 15 U/L (ref 0–53)
AST: 17 U/L (ref 0–37)
Anion gap: 12 (ref 5–15)
BUN: 12 mg/dL (ref 6–23)
CO2: 26 mEq/L (ref 19–32)
Calcium: 9.1 mg/dL (ref 8.4–10.5)
Chloride: 100 mEq/L (ref 96–112)
Creatinine, Ser: 0.83 mg/dL (ref 0.50–1.35)
GFR calc Af Amer: >90 mL/min (ref >90)
GFR calc non Af Amer: 81 mL/min — ABNORMAL LOW (ref >90)
GLUCOSE: 134 mg/dL — AB (ref 70–99)
POTASSIUM: 3.8 meq/L (ref 3.7–5.3)
Sodium: 138 mEq/L (ref 137–147)
TOTAL PROTEIN: 7.2 g/dL (ref 6.0–8.3)
Total Bilirubin: 0.3 mg/dL (ref 0.3–1.2)

## 2013-12-22 LAB — RAPID URINE DRUG SCREEN, HOSP PERFORMED
Amphetamines: NOT DETECTED
Barbiturates: NOT DETECTED
Benzodiazepines: NOT DETECTED
COCAINE: NOT DETECTED
Opiates: NOT DETECTED
TETRAHYDROCANNABINOL: NOT DETECTED

## 2013-12-22 LAB — GLUCOSE, CAPILLARY
Glucose-Capillary: 88 mg/dL (ref 70–99)
Glucose-Capillary: 98 mg/dL (ref 70–99)

## 2013-12-22 LAB — DIFFERENTIAL
Basophils Absolute: 0.1 10*3/uL (ref 0.0–0.1)
Basophils Relative: 1 % (ref 0–1)
EOS ABS: 0.3 10*3/uL (ref 0.0–0.7)
Eosinophils Relative: 3 % (ref 0–5)
LYMPHS ABS: 2.6 10*3/uL (ref 0.7–4.0)
Lymphocytes Relative: 26 % (ref 12–46)
Monocytes Absolute: 0.8 10*3/uL (ref 0.1–1.0)
Monocytes Relative: 8 % (ref 3–12)
NEUTROS PCT: 62 % (ref 43–77)
Neutro Abs: 6.5 10*3/uL (ref 1.7–7.7)

## 2013-12-22 LAB — HEMOGLOBIN A1C
HEMOGLOBIN A1C: 6.7 % — AB (ref <5.7)
Mean Plasma Glucose: 146 mg/dL — ABNORMAL HIGH (ref <117)

## 2013-12-22 LAB — PROTIME-INR
INR: 0.98 (ref 0.00–1.49)
PROTHROMBIN TIME: 13 s (ref 11.6–15.2)

## 2013-12-22 MED ORDER — CLOPIDOGREL BISULFATE 75 MG PO TABS
75.0000 mg | ORAL_TABLET | Freq: Every day | ORAL | Status: DC
  Administered 2013-12-22 – 2013-12-23 (×2): 75 mg via ORAL
  Filled 2013-12-22 (×2): qty 1

## 2013-12-22 MED ORDER — CIPROFLOXACIN HCL 500 MG PO TABS
500.0000 mg | ORAL_TABLET | Freq: Every day | ORAL | Status: AC
  Administered 2013-12-22 – 2013-12-23 (×2): 500 mg via ORAL
  Filled 2013-12-22 (×2): qty 1

## 2013-12-22 MED ORDER — ENOXAPARIN SODIUM 40 MG/0.4ML ~~LOC~~ SOLN
40.0000 mg | SUBCUTANEOUS | Status: DC
Start: 2013-12-22 — End: 2013-12-23
  Administered 2013-12-22 – 2013-12-23 (×2): 40 mg via SUBCUTANEOUS
  Filled 2013-12-22 (×2): qty 0.4

## 2013-12-22 MED ORDER — INSULIN ASPART 100 UNIT/ML ~~LOC~~ SOLN: 0.0000 [IU] | Freq: Three times a day (TID) | SUBCUTANEOUS | Status: DC

## 2013-12-22 MED ORDER — SIMVASTATIN 20 MG PO TABS: 20.0000 mg | ORAL_TABLET | ORAL | Status: DC

## 2013-12-22 MED ORDER — MOMETASONE FURO-FORMOTEROL FUM 100-5 MCG/ACT IN AERO
2.0000 | INHALATION_SPRAY | Freq: Two times a day (BID) | RESPIRATORY_TRACT | Status: DC
  Administered 2013-12-23: 2 via RESPIRATORY_TRACT
  Filled 2013-12-22: qty 8.8

## 2013-12-22 MED ORDER — MAGNESIUM GLUCONATE 500 (27 MG) MG PO TABS
500.0000 mg | ORAL_TABLET | Freq: Every day | ORAL | Status: DC
  Administered 2013-12-22: 500 mg via ORAL
  Filled 2013-12-22 (×3): qty 1

## 2013-12-22 MED ORDER — ACETAMINOPHEN 325 MG PO TABS: 650.0000 mg | ORAL_TABLET | ORAL | Status: DC | PRN

## 2013-12-22 MED ORDER — STROKE: EARLY STAGES OF RECOVERY BOOK
Freq: Once | Status: AC
  Administered 2013-12-22: 16:00:00
  Filled 2013-12-22: qty 1

## 2013-12-22 NOTE — H&P (Signed)
History and Physical  [name] INO:[id number] DOB: Jan 21, 1932 DOA: 12/22/2013  Referring physician: Dr. Pryor Curia, EDP PCP: Dorian Heckle, MD  Outpatient Specialists:  1. None  Chief Complaint: word finding difficulty, slurred speech, facial droop and right-sided numbness.  HPI: [name] is a 80 y.o. male with history of diet-controlled DM 2, last A1c 6.7, dyslipidemia, prior TIA/?stroke on Plavix, presented to the ED with above complaints. He states that 3 weeks ago, he had transient word finding difficulties and right-sided numbness which lasted less than an hour and resolved spontaneously. His neighbor who is a physician came to his house, assessed him and patient did not seek any other medical attention. On 12/22/13 at approximately 6:30 AM, patient woke up and noticed difficulty speaking, word finding difficulty, spouse noticed slurred speech and face leaning towards the left and patient also complained of bilateral numbness more so on the right lower extremity. This was not associated with asymmetric limb weakness. Patient denied headache, dizziness, lightheadedness, chest pain or palpitations. The slurred speech and facial asymmetry gradually resolved over the next hour. Word finding difficulty has improved but has not completely resolved. Numbness of his limbs has also resolved. He claims compliance to his Plavix. He states that he used to be on aspirin before he started Plavix but cannot remember why it was switched. In the ED, Hospitalist admission requested.   Review of Systems: All systems reviewed and apart from history of presenting illness, are negative.  Past Medical History  Diagnosis Date  . Diverticulosis   . Hiatal hernia   . Colitis   . Hemorrhoids   . Colon polyps   . Proctitis   . GERD (gastroesophageal reflux disease)   . Osteoarthritis   . Asthma   . COPD (chronic obstructive pulmonary disease)   . Status post dilation of esophageal narrowing   .  HLD (hyperlipidemia)   . Stroke   . Complication of anesthesia   . PONV (postoperative nausea and vomiting)   . DM (diabetes mellitus)     dr Meyer Cory   Past Surgical History  Procedure Laterality Date  . Nissen    . Splenectomy, total    . Esophageal hernia    . Abdominal hernia repair    . Ventral hernia repair  01/29/2012     WITH MESH  . Ventral hernia repair  01/29/2012    Procedure: HERNIA REPAIR VENTRAL ADULT;  Surgeon: Gwenyth Ober, MD;  Location: [facility];  Service: General;  Laterality: N/A;  open recurrent ventral hernia repair with mesh  . Insertion of mesh  01/29/2012    Procedure: INSERTION OF MESH;  Surgeon: Gwenyth Ober, MD;  Location: [facility];  Service: General;  Laterality: N/A;   Social History:  reports that he has never smoked. He has never used smokeless tobacco. He reports that he drinks alcohol. He reports that he does not use illicit drugs. Married. Lives with spouse. Independent of activities of daily living. Drinks 2 glasses of wine every night.  No Known Allergies  Family History  Problem Relation Age of Onset  . Cancer      Prior to Admission medications   Medication Sig Start Date End Date Taking? Authorizing Provider  ciprofloxacin (CIPRO) 500 MG tablet Take 500 mg by mouth daily. Started on 11/4, finish on 11/7 12/20/13  Yes Historical Provider, MD  clopidogrel (PLAVIX) 75 MG tablet Take 75 mg by mouth daily.   Yes Historical Provider, MD  fluticasone-salmeterol (  ADVAIR HFA) 115-21 MCG/ACT inhaler Inhale 2 puffs into the lungs 2 (two) times daily.   Yes Historical Provider, MD  Magnesium Gluconate 500 (27 MG) MG TABS Take 500 mg by mouth at bedtime.   Yes Historical Provider, MD  methylcellulose (CITRUCEL) oral powder Take 1 packet by mouth daily.   Yes Historical Provider, MD  Multiple Vitamin (MULTIVITAMIN WITH MINERALS) TABS Take 1 tablet by mouth daily.   Yes Historical Provider, MD  simvastatin (ZOCOR) 20 MG tablet Take 20 mg by mouth every  other day.    Yes Historical Provider, MD  traMADol (ULTRAM) 50 MG tablet Take 50 mg by mouth every 6 (six) hours. 12/08/13  Yes Historical Provider, MD  dicyclomine (BENTYL) 10 MG capsule Take 10 mg by mouth 2 (two) times daily. 12/11/11   Milus Banister, MD  metoCLOPramide (REGLAN) 5 MG tablet Take 1 tablet (5 mg total) by mouth 3 (three) times daily before meals. 02/04/12   Doreen Salvage, MD  oxyCODONE-acetaminophen (PERCOCET/ROXICET) 5-325 MG per tablet Take 1-2 tablets by mouth every 4 (four) hours as needed. 02/04/12   Doreen Salvage, MD  zolpidem (AMBIEN) 5 MG tablet Take 1 tablet (5 mg total) by mouth at bedtime as needed for sleep. 02/04/12   Doreen Salvage, MD   Physical Exam: Filed Vitals:   12/22/13 1130 12/22/13 1215 12/22/13 1248 12/22/13 1301  BP: 131/79 157/123 144/112   Pulse: 76 71 71   Temp:    98.4 F (36.9 C)  TempSrc:      Resp: 21  15   Height:      Weight:      SpO2: 97% 98% 98%      General exam: Moderately built and nourished pleasant elderly male patient, lying comfortably supine on the gurney in no obvious distress.  Head, eyes and ENT: Nontraumatic and normocephalic. Pupils equally reacting to light and accommodation. Oral mucosa moist.  Neck: Supple. No JVD, carotid bruit or thyromegaly.  Lymphatics: No lymphadenopathy.  Respiratory system: Clear to auscultation. No increased work of breathing.  Cardiovascular system: S1 and S2 heard, RRR. No JVD, murmurs, gallops, clicks or pedal edema.  Gastrointestinal system: Abdomen is nondistended, soft and nontender. Normal bowel sounds heard. No organomegaly or masses appreciated. Midline laparotomy scar.  Central nervous system: Alert and oriented4. Mild facial asymmetry with droop to the left, mild dysarthria and some word finding difficulty.  Extremities: Symmetric 5 x 5 power. Peripheral pulses symmetrically felt. No pronator drift. No sensory deficits at this time.  Skin: No rashes or acute  findings.  Musculoskeletal system: Negative exam.  Psychiatry: Pleasant and cooperative.   Labs on Admission:  Basic Metabolic Panel:  Recent Labs Lab 12/22/13 1020  NA 138  K 3.8  CL 100  CO2 26  GLUCOSE 134*  BUN 12  CREATININE 0.83  CALCIUM 9.1   Liver Function Tests:  Recent Labs Lab 12/22/13 1020  AST 17  ALT 15  ALKPHOS 88  BILITOT 0.3  PROT 7.2  ALBUMIN 3.5   No results for input(s): LIPASE, AMYLASE in the last 168 hours. No results for input(s): AMMONIA in the last 168 hours. CBC:  Recent Labs Lab 12/22/13 1020  WBC 10.3  NEUTROABS 6.5  HGB 16.8  HCT 50.2  MCV 96.9  PLT 480*   Cardiac Enzymes: No results for input(s): CKTOTAL, CKMB, CKMBINDEX, TROPONINI in the last 168 hours.  BNP (last 3 results) No results for input(s): PROBNP in the last 8760 hours. CBG: No results  for input(s): GLUCAP in the last 168 hours.  Radiological Exams on Admission: Ct Head Wo Contrast  12/22/2013   CLINICAL DATA:  TIA.  Slurred speech  EXAM: CT HEAD WITHOUT CONTRAST  TECHNIQUE: Contiguous axial images were obtained from the base of the skull through the vertex without intravenous contrast.  COMPARISON:  MRI 12/08/2006  FINDINGS: Generalized atrophy with progression. Chronic microvascular ischemic change has progressed.  Negative for acute infarct.  Negative for hemorrhage or mass lesion.  Atherosclerotic disease. Sinusitis with mucosal thickening and air-fluid levels. Calvarium intact.  IMPRESSION: Progression of atrophy and chronic microvascular ischemia. No acute abnormality.  Sinusitis with air-fluid levels   Electronically Signed   By: Franchot Gallo M.D.   On: 12/22/2013 12:15    EKG: Independently reviewed. Sinus rhythm without acute changes.  Assessment/Plan Principal Problem:   TIA (transient ischemic attack) Active Problems:   Dyslipidemia   Diabetes mellitus type 2, diet-controlled   1. TIA: Patient gives a prior history of TIA/? Stroke. He was  apparently on aspirin prior to switching to Plavix years ago and claims compliance to same. Admit to telemetry for observation and evaluation. Complete TIA workup-MRI and MRA brain, 2-D echo, carotid Dopplers, hemoglobin A1c and fasting lipids. Continue Plavix but will await neurology recommendations due to the fact that patient had TIA while on Plavix. PT, OT and ST evaluation. Allow for permissive hypertension. Not candidate for TPA due to rapid resolution of symptoms. 2. Dyslipidemia: Continue statins. Check fasting lipids 3. Diet-controlled type II DM: States that her recent A1c was 6.7. Not on medications at home. SSI while hospitalized. 4. Recent UTI: Complete course of Cipro on 11/7. UA unimpressive.     Code Status: Full  Family Communication: Discussed with spouse at bedside.  Disposition Plan: Home when medically stable.   Time spent: 60 minutes.  Vernell Leep, MD, FACP, FHM. [facility] Pager [phone]  If 7PM-7AM, please contact night-coverage [email or web address] Password [email or web address] 12/22/2013, 1:54 PM

## 2013-12-22 NOTE — Progress Notes (Signed)
Occupational Therapy Evaluation and Discharge Patient Details Name: [name] MRN: [id number] DOB: 07-10-1931 Today's Date: 12/22/2013    History of Present Illness Patient is a 80 y/o male admitted with word finding difficulty, slurred speech, facial droop and right-sided numbness which has now resolved per pt report. He states that 3 weeks ago, he had transient word finding difficulties and right-sided numbness which lasted less than an hour and resolved spontaneously. On 11/6, patient woke up and noticed difficulty speaking and word finding difficulty; spouse noticed slurred speech and face leaning towards the left and patient also complained of bilateral numbness more so on the right lower extremity. PMH of diet-controlled DM 2, dyslipidemia, prior TIA/?stroke on Plavix and COPD. CT head- negative. Workup pending.   Clinical Impression   PTA pt lived at home with his wife and was independent with ADLs. Pt is very active and enjoys golfing. Currently pt requires Mod I for ADLs and educated on ways to increase safety at home through sitting for bathing and dressing as well as fall prevention techniques. No further acute OT needs.     Follow Up Recommendations  No OT follow up;Supervision - Intermittent    Equipment Recommendations  None recommended by OT    Recommendations for Other Services       Precautions / Restrictions Precautions Precautions: None Restrictions Weight Bearing Restrictions: No      Mobility Bed Mobility Overal bed mobility: Independent                Transfers Overall transfer level: Independent                    Balance Overall balance assessment: No apparent balance deficits (not formally assessed)                                ADL Overall ADL's : Modified independent                                       General ADL Comments: Pt overall Mod I for ADLs and wife is home to assist. Educated pt on  sitting for LB dressing and bathing and wife reports that they have a shower seat but pt does not use it. Discussed safety concerns and fall prevention. Pt familiar with signs of stroke.      Vision  Pt wears glasses (bifocals) at all times and reports no change from baseline.  No apparent visual deficits. Vision screen [facility]                  Perception Perception Perception Tested?: No   Praxis Praxis Praxis tested?: Within functional limits    Pertinent Vitals/Pain Pain Assessment: No/denies pain     Hand Dominance Right   Extremity/Trunk Assessment Upper Extremity Assessment Upper Extremity Assessment: Overall WFL for tasks assessed   Lower Extremity Assessment Lower Extremity Assessment: Overall WFL for tasks assessed   Cervical / Trunk Assessment Cervical / Trunk Assessment: Normal   Communication Communication Communication: No difficulties (No obvious expressive difficulties.)   Cognition Arousal/Alertness: Awake/alert Behavior During Therapy: WFL for tasks assessed/performed Overall Cognitive Status: History of cognitive impairments - at baseline (pt's wife reports he is at baseline)       Memory: Decreased short-term memory  Home Living Family/patient expects to be discharged to:: Private residence Living Arrangements: Spouse/significant other Available Help at Discharge: Family;Available 24 hours/day Type of Home: House Home Access: Stairs to enter [employer] of Steps: 3 Entrance Stairs-Rails: Right Home Layout: Multi-level Alternate Level Stairs-Number of Steps: 1 flight to get to 2nd floor and 1 flight to get to basement Alternate Level Stairs-Rails: Right;Left Bathroom Shower/Tub: Occupational psychologist: Standard     Home Equipment: Shower seat - built in;Grab bars - tub/shower;Hand held shower head      Lives With: Spouse    Prior Functioning/Environment Level of Independence:  Independent        Comments: Pt very active PTA. Plays golf occasionally and wife/pt travel often.    OT Diagnosis: Generalized weakness;Cognitive deficits    End of Session Nurse Communication: Mobility status  Activity Tolerance: Patient tolerated treatment well Patient left: in chair;with call bell/phone within reach;with family/visitor present   Time: 4540-9811 OT Time Calculation (min): 18 min Charges:  OT General Charges $OT Visit: 1 Procedure OT Evaluation $Initial OT Evaluation Tier I: 1 Procedure OT Treatments $Self Care/Home Management : 8-22 mins G-Codes: OT G-codes **NOT FOR INPATIENT CLASS** Functional Assessment Tool Used: clinical judgement Functional Limitation: Self care Self Care Current Status (B1478): At least 1 percent but less than 20 percent impaired, limited or restricted Self Care Goal Status (G9562): At least 1 percent but less than 20 percent impaired, limited or restricted Self Care Discharge Status [phone]): At least 1 percent but less than 20 percent impaired, limited or restricted  Juluis Rainier 12/22/2013, 5:17 PM   Cyndie Chime, OTR/L Occupational Therapist [phone] (pager)

## 2013-12-22 NOTE — Consult Note (Signed)
Referring Physician: [place]    Chief Complaint: Stroke  HPI:                                                                                                                                         [name] is an 80 y.o. male who states 3 weeks ago he had a period of time where he was having difficulty finding words and some tingling in his right hand. This lasted for about one hour and then cleared. He did not seek medical attention at that time. This AM he woke up at around 0630 and noted he had difficulty finding words and decreased sensation in his right hand. He was brought to ED where he continued to have word finding difficulty but feels this has improved significantly. He no longer has decreased sensation in his right hand. HE states he has taken his Plavix daily and not missed one dose.   Date last known well: Date: 12/21/2013 Time last known well: Time: 22:00 tPA Given: No: out of window  Past Medical History  Diagnosis Date  . Diverticulosis   . Hiatal hernia   . Colitis   . Hemorrhoids   . Colon polyps   . Proctitis   . GERD (gastroesophageal reflux disease)   . Osteoarthritis   . Asthma   . COPD (chronic obstructive pulmonary disease)   . Status post dilation of esophageal narrowing   . HLD (hyperlipidemia)   . Stroke   . Complication of anesthesia   . PONV (postoperative nausea and vomiting)   . DM (diabetes mellitus)     dr Meyer Cory    Past Surgical History  Procedure Laterality Date  . Nissen    . Splenectomy, total    . Esophageal hernia    . Abdominal hernia repair    . Ventral hernia repair  01/29/2012     WITH MESH  . Ventral hernia repair  01/29/2012    Procedure: HERNIA REPAIR VENTRAL ADULT;  Surgeon: Gwenyth Ober, MD;  Location: [facility];  Service: General;  Laterality: N/A;  open recurrent ventral hernia repair with mesh  . Insertion of mesh  01/29/2012    Procedure: INSERTION OF MESH;  Surgeon: Gwenyth Ober, MD;  Location: [facility];  Service:  General;  Laterality: N/A;    Family History  Problem Relation Age of Onset  . Cancer     Social History:  reports that he has never smoked. He has never used smokeless tobacco. He reports that he drinks alcohol. He reports that he does not use illicit drugs.  Allergies: No Known Allergies  Medications:  Current Facility-Administered Medications  Medication Dose Route Frequency Provider Last Rate Last Dose  .  stroke: mapping our early stages of recovery book   Does not apply Once Modena Jansky, MD      . acetaminophen (TYLENOL) tablet 650 mg  650 mg Oral Q4H PRN Modena Jansky, MD      . ciprofloxacin (CIPRO) tablet 500 mg  500 mg Oral Daily Modena Jansky, MD      . clopidogrel (PLAVIX) tablet 75 mg  75 mg Oral Daily Modena Jansky, MD      . enoxaparin (LOVENOX) injection 40 mg  40 mg Subcutaneous Q24H Modena Jansky, MD      . insulin aspart (novoLOG) injection 0-9 Units  0-9 Units Subcutaneous TID WC Modena Jansky, MD      . Magnesium Gluconate TABS 500 mg  500 mg Oral QHS Modena Jansky, MD      . mometasone-formoterol (DULERA) 100-5 MCG/ACT inhaler 2 puff  2 puff Inhalation BID Modena Jansky, MD      . Derrill Memo ON 12/23/2013] simvastatin (ZOCOR) tablet 20 mg  20 mg Oral Q48H Modena Jansky, MD         ROS:                                                                                                                                       History obtained from the patient  General ROS: negative for - chills, fatigue, fever, night sweats, weight gain or weight loss Psychological ROS: negative for - behavioral disorder, hallucinations, memory difficulties, mood swings or suicidal ideation Ophthalmic ROS: negative for - blurry vision, double vision, eye pain or loss of vision ENT ROS: negative for - epistaxis, nasal discharge, oral lesions,  sore throat, tinnitus or vertigo Allergy and Immunology ROS: negative for - hives or itchy/watery eyes Hematological and Lymphatic ROS: negative for - bleeding problems, bruising or swollen lymph nodes Endocrine ROS: negative for - galactorrhea, hair pattern changes, polydipsia/polyuria or temperature intolerance Respiratory ROS: negative for - cough, hemoptysis, shortness of breath or wheezing Cardiovascular ROS: negative for - chest pain, dyspnea on exertion, edema or irregular heartbeat Gastrointestinal ROS: negative for - abdominal pain, diarrhea, hematemesis, nausea/vomiting or stool incontinence Genito-Urinary ROS: negative for - dysuria, hematuria, incontinence or urinary frequency/urgency Musculoskeletal ROS: negative for - joint swelling or muscular weakness Neurological ROS: as noted in HPI Dermatological ROS: negative for rash and skin lesion changes  Neurologic Examination:  Blood pressure 144/112, pulse 71, temperature 98.4 F (36.9 C), temperature source Oral, resp. rate 15, height 5\' 10"  (1.778 m), weight 77.111 kg (170 lb), SpO2 98 %.   General: NAD Mental Status: Alert, oriented, thought content appropriate.  Speech slightly slurred without evidence of aphasia.  Able to follow 3 step commands without difficulty. Cranial Nerves: II: Discs flat bilaterally; Visual fields grossly normal, pupils equal, round, reactive to light and accommodation III,IV, VI: ptosis not present, extra-ocular motions intact bilaterally V,VII: smile symmetric, facial light touch sensation normal bilaterally VIII: hearing normal bilaterally IX,X: gag reflex present XI: bilateral shoulder shrug XII: midline tongue extension without atrophy or fasciculations  Motor: Right : Upper extremity   5/5    Left:     Upper extremity   5/5  Lower extremity   5/5     Lower extremity   5/5 Tone and bulk:normal  tone throughout; no atrophy noted Sensory: Pinprick and light touch intact throughout, bilaterally Deep Tendon Reflexes:  1+ throughout with no AJ  Plantars: Right: downgoing   Left: downgoing Cerebellar: normal finger-to-nose,  normal heel-to-shin test Gait: not tested CV: pulses palpable throughout    Lab Results: Basic Metabolic Panel:  Recent Labs Lab 12/22/13 1020  NA 138  K 3.8  CL 100  CO2 26  GLUCOSE 134*  BUN 12  CREATININE 0.83  CALCIUM 9.1    Liver Function Tests:  Recent Labs Lab 12/22/13 1020  AST 17  ALT 15  ALKPHOS 88  BILITOT 0.3  PROT 7.2  ALBUMIN 3.5   No results for input(s): LIPASE, AMYLASE in the last 168 hours. No results for input(s): AMMONIA in the last 168 hours.  CBC:  Recent Labs Lab 12/22/13 1020  WBC 10.3  NEUTROABS 6.5  HGB 16.8  HCT 50.2  MCV 96.9  PLT 480*    Cardiac Enzymes: No results for input(s): CKTOTAL, CKMB, CKMBINDEX, TROPONINI in the last 168 hours.  Lipid Panel: No results for input(s): CHOL, TRIG, HDL, CHOLHDL, VLDL, LDLCALC in the last 168 hours.  CBG: No results for input(s): GLUCAP in the last 168 hours.  Microbiology: Results for orders placed or performed in visit on 02/08/12  Stool C-Diff Toxin Assay     Status: None   Collection Time: 02/08/12 12:09 PM  Result Value Ref Range Status   CDIFTX NEGATIVE  Final    Coagulation Studies:  Recent Labs  12/22/13 1020  LABPROT 13.0  INR 0.98    Imaging: Ct Head Wo Contrast  12/22/2013   CLINICAL DATA:  TIA.  Slurred speech  EXAM: CT HEAD WITHOUT CONTRAST  TECHNIQUE: Contiguous axial images were obtained from the base of the skull through the vertex without intravenous contrast.  COMPARISON:  MRI 12/08/2006  FINDINGS: Generalized atrophy with progression. Chronic microvascular ischemic change has progressed.  Negative for acute infarct.  Negative for hemorrhage or mass lesion.  Atherosclerotic disease. Sinusitis with mucosal thickening and  air-fluid levels. Calvarium intact.  IMPRESSION: Progression of atrophy and chronic microvascular ischemia. No acute abnormality.  Sinusitis with air-fluid levels   Electronically Signed   By: Franchot Gallo M.D.   On: 12/22/2013 12:15       Assessment and plan discussed with with attending physician and they are in agreement.    Etta Quill PA-C [facility] [phone]  12/22/2013, 2:24 PM   Assessment: 80 y.o. male with symptoms of word finding difficulty and right hand decreased sensation. Symptoms have improved significantly but still having intermittent word finding  difficulty and slightly slurred speech. CT head shows no acute infarct. Given symptoms have not fully resolved, cannot rule out TIA versus CVA.   Stroke Risk Factors - diabetes mellitus  Recommend: 1. HgbA1c, fasting lipid panel 2. MRI, MRA  of the brain without contrast 3. PT consult, OT consult, Speech consult 4. Echocardiogram 5. Carotid dopplers 6. Prophylactic therapy-Antiplatelet med: Plavix - dose 75 mg daily 7. Risk factor modification 8. Telemetry monitoring 9. Frequent neuro checks   I personally participated in this patient's evaluation and management, including formulating above clinical impression and management recommendations.  Rush Farmer M.D. [facility] [phone]

## 2013-12-22 NOTE — ED Notes (Signed)
Pt. Transported to CT 

## 2013-12-22 NOTE — ED Provider Notes (Signed)
TIME SEEN: 10:14 AM  CHIEF COMPLAINT: right-sided numbness, aphasia  HPI: Pt is a 80 y.o. M with history of COPD, hyperlipidemia, diabetes, prior stroke who presents to the emergency department with an episode of aphasia and right-sided numbness when he woke up this morning at 6:30 AM. He reports that symptoms lasted for well over an hour and slowly dissipated. He still is having a minimal amount of right hand numbness and states it feels like "your hand is still waking up".  No associated weakness. He did have a mild frontal headache. No recent head injury. He is on Plavix. No chest pain or shortness of breath. No history of atrial fibrillation, CHF. Had similar symptoms 3 weeks ago that only lasted several minutes. He was not seen by a PCP and did not go to the hospital for this.    ROS: See HPI Constitutional: no fever  Eyes: no drainage  ENT: no runny nose   Cardiovascular:  no chest pain  Resp: no SOB  GI: no vomiting GU: no dysuria Integumentary: no rash  Allergy: no hives  Musculoskeletal: no leg swelling  Neurological: no slurred speech ROS otherwise negative  PAST MEDICAL HISTORY/PAST SURGICAL HISTORY:  Past Medical History  Diagnosis Date  . Diverticulosis   . Hiatal hernia   . Colitis   . Hemorrhoids   . Colon polyps   . Proctitis   . GERD (gastroesophageal reflux disease)   . Osteoarthritis   . Asthma   . COPD (chronic obstructive pulmonary disease)   . Status post dilation of esophageal narrowing   . HLD (hyperlipidemia)   . Stroke   . Complication of anesthesia   . PONV (postoperative nausea and vomiting)   . DM (diabetes mellitus)     dr Maxwell Caul pcp    MEDICATIONS:  Prior to Admission medications   Medication Sig Start Date End Date Taking? Authorizing Provider  clopidogrel (PLAVIX) 75 MG tablet Take 75 mg by mouth daily.    Historical Provider, MD  dicyclomine (BENTYL) 10 MG capsule Take 10 mg by mouth 2 (two) times daily. 12/11/11   Milus Banister, MD   fluticasone-salmeterol (ADVAIR HFA) [phone] MCG/ACT inhaler Inhale 2 puffs into the lungs 2 (two) times daily.    Historical Provider, MD  Magnesium Gluconate 500 (27 MG) MG TABS Take 500 mg by mouth at bedtime.    Historical Provider, MD  methylcellulose (CITRUCEL) oral powder Take 1 packet by mouth daily.    Historical Provider, MD  metoCLOPramide (REGLAN) 5 MG tablet Take 1 tablet (5 mg total) by mouth 3 (three) times daily before meals. 02/04/12   Doreen Salvage, MD  Multiple Vitamin (MULTIVITAMIN WITH MINERALS) TABS Take 1 tablet by mouth daily.    Historical Provider, MD  oxyCODONE-acetaminophen (PERCOCET/ROXICET) 5-325 MG per tablet Take 1-2 tablets by mouth every 4 (four) hours as needed. 02/04/12   Doreen Salvage, MD  simvastatin (ZOCOR) 20 MG tablet Take 20 mg by mouth every evening.    Historical Provider, MD  zolpidem (AMBIEN) 5 MG tablet Take 1 tablet (5 mg total) by mouth at bedtime as needed for sleep. 02/04/12   Doreen Salvage, MD    ALLERGIES:  No Known Allergies  SOCIAL HISTORY:  History  Substance Use Topics  . Smoking status: Never Smoker   . Smokeless tobacco: Never Used  . Alcohol Use: Yes     Comment: 1 1/2 - 2 daily    FAMILY HISTORY: Family History  Problem Relation Age of Onset  .  Cancer      EXAM: BP 155/80 mmHg  Pulse 98  Temp(Src) 97.5 F (36.4 C) (Oral)  Resp 22  Ht 5\' 10"  (1.778 m)  Wt 170 lb (77.111 kg)  BMI 24.39 kg/m2  SpO2 99% CONSTITUTIONAL: Alert and oriented and responds appropriately to questions. Well-appearing; well-nourished HEAD: Normocephalic EYES: Conjunctivae clear, PERRL ENT: normal nose; no rhinorrhea; moist mucous membranes; pharynx without lesions noted NECK: Supple, no meningismus, no LAD  CARD: RRR; S1 and S2 appreciated; no murmurs, no clicks, no rubs, no gallops RESP: Normal chest excursion without splinting or tachypnea; breath sounds clear and equal bilaterally; no wheezes, no rhonchi, no rales,  ABD/GI: Normal bowel sounds;  non-distended; soft, non-tender, no rebound, no guarding BACK:  The back appears normal and is non-tender to palpation, there is no CVA tenderness EXT: Normal ROM in all joints; non-tender to palpation; no edema; normal capillary refill; no cyanosis    SKIN: Normal color for age and race; warm NEURO: Moves all extremities equally; Strength 5/5 in all 4 sensation to light touch intact diffusely, cranial nerves II through XII intact, no pronator drift, normal gait, NIH scale 0 PSYCH: The patient's mood and manner are appropriate. Grooming and personal hygiene are appropriate.  MEDICAL DECISION MAKING: Patient here with symptoms of TIA. He does have risk factors for stroke.  Will obtain CT head, labs, urine. Wife reports he has been treated for UTI with Cipro currently. Anticipate admission for TIA workup. Patient and wife agree with plan.  ED PROGRESS: CT head shows no acute abnormalities. Labs unremarkable. Urinalysis pending. Last blood pressure was 144/77. He does have some intermittent elevated blood pressures that are registering on the monitor but this is due to patient positioning. Discussed with hospitalist for admission to telemetry, observation.      EKG Interpretation  Date/Time:  Friday December 22 2013 09:56:59 EST Ventricular Rate:  95 PR Interval:  196 QRS Duration: 84 QT Interval:  334 QTC Calculation: 419 R Axis:   -7 Text Interpretation:  Normal sinus rhythm Possible Anterior infarct , age undetermined Abnormal ECG No significant change since last tracing Confirmed by Locklyn Henriquez,  DO, Sinclair Arrazola [id number]) on 12/22/2013 10:01:58 AM        [place], DO 12/22/13 1259

## 2013-12-22 NOTE — ED Notes (Signed)
Pt and wife given warm [place] sandwiches and drinks. Aware of plan for admission and agreeable.

## 2013-12-22 NOTE — Progress Notes (Signed)
Pt arrived to the unit.  Welcomed and settled in.  Call bell within reach.  Will continue to monitor. Cori Razor, RN

## 2013-12-22 NOTE — Evaluation (Signed)
Speech Language Pathology Evaluation Patient Details Name: [name] MRN: [id number] DOB: 1931/12/08 Today's Date: 12/22/2013 Time: 3976-7341 SLP Time Calculation (min): 18 min  Problem List:  Patient Active Problem List   Diagnosis Date Noted  . TIA (transient ischemic attack) 12/22/2013  . Diabetes mellitus type 2, diet-controlled 12/22/2013  . Postop check 02/16/2012  . Recurrent ventral hernia 12/15/2011  . ESOPHAGEAL STRICTURE 10/11/2007  . Dyslipidemia 06/24/2007  . DIVERTICULOSIS 06/24/2007  . STROKE 06/24/2007   Past Medical History:  Past Medical History  Diagnosis Date  . Diverticulosis   . Hiatal hernia   . Colitis   . Hemorrhoids   . Colon polyps   . Proctitis   . GERD (gastroesophageal reflux disease)   . Osteoarthritis   . Asthma   . COPD (chronic obstructive pulmonary disease)   . Status post dilation of esophageal narrowing   . HLD (hyperlipidemia)   . Stroke   . Complication of anesthesia   . PONV (postoperative nausea and vomiting)   . DM (diabetes mellitus)     dr Meyer Cory   Past Surgical History:  Past Surgical History  Procedure Laterality Date  . Nissen    . Splenectomy, total    . Esophageal hernia    . Abdominal hernia repair    . Ventral hernia repair  01/29/2012     WITH MESH  . Ventral hernia repair  01/29/2012    Procedure: HERNIA REPAIR VENTRAL ADULT;  Surgeon: Gwenyth Ober, MD;  Location: [place];  Service: General;  Laterality: N/A;  open recurrent ventral hernia repair with mesh  . Insertion of mesh  01/29/2012    Procedure: INSERTION OF MESH;  Surgeon: Gwenyth Ober, MD;  Location: [facility];  Service: General;  Laterality: N/A;   HPI:  [name] is an 80 y.o. male who states 3 weeks ago he had a period of time where he was having difficulty finding words and some tingling in his right hand. This lasted for about one hour and then cleared. He did not seek medical attention at that time. The morning of 12/22/13 he woke up  around 0630 and noted he had difficulty finding words and decreased sensation in his right hand. He was brought to ED where he continued to have word finding difficulty but feels this has improved significantly. He no longer has decreased sensation in his right hand. He states he has taken his Plavix daily and not missed one dose. CT is without acute abnormality, MRI still pending.   Assessment / Plan / Recommendation Clinical Impression  Pt presents with mild impairments in complex problem solving and retrieval of new information, although linguistic skills appear grossly within functional limits. Pt would benefit from brief f/u for further assessment of higher level cognition and memory strategies.     SLP Assessment  Patient needs continued Speech Lanaguage Pathology Services    Follow Up Recommendations   (tbd)    Frequency and Duration min 2x/week  1 week   Pertinent Vitals/Pain Pain Assessment: No/denies pain   SLP Goals  Patient/Family Stated Goal: none stated Potential to Achieve Goals: Good  SLP Evaluation Prior Functioning  Cognitive/Linguistic Baseline: Baseline deficits Baseline deficit details: pt reports mild difficulty with memory at baseline Type of Home: House  Lives With: Spouse Available Help at Discharge: Family Vocation: Retired   Associate Professor  Overall Cognitive Status: Impaired/Different from baseline Arousal/Alertness: Awake/alert Orientation Level: Oriented X4 Attention: Sustained;Selective Sustained Attention: Appears intact Selective Attention: Appears  intact Memory: Impaired Memory Impairment: Retrieval deficit;Decreased recall of new information Awareness: Appears intact Problem Solving: Impaired Problem Solving Impairment: Functional complex Safety/Judgment: Appears intact    Comprehension  Auditory Comprehension Overall Auditory Comprehension: Appears within functional limits for tasks assessed Visual  Recognition/Discrimination Discrimination: Not tested Reading Comprehension Reading Status: Within funtional limits    Expression Expression Primary Mode of Expression: Verbal Verbal Expression Overall Verbal Expression: Appears within functional limits for tasks assessed Written Expression Written Expression: Not tested   Oral / Motor Motor Speech Overall Motor Speech: Appears within functional limits for tasks assessed   GO Functional Assessment Tool Used: skilled clinical judgment Functional Limitations: Memory Memory Current Status (R1021): At least 20 percent but less than 40 percent impaired, limited or restricted Memory Goal Status (R1735): At least 1 percent but less than 20 percent impaired, limited or restricted     Germain Osgood, M.A. CCC-SLP [phone]  Germain Osgood 12/22/2013, 3:59 PM

## 2013-12-22 NOTE — ED Notes (Signed)
Per pt and wife pt woke up not being able to speak well and had some numbness in hand and legs states same thing happened a few weeks ago and pt got  Bette. Per wife it is better now than when he first got up

## 2013-12-22 NOTE — ED Notes (Signed)
Attempted report 

## 2013-12-22 NOTE — Evaluation (Signed)
Physical Therapy Evaluation Patient Details Name: [name] MRN: [id number] DOB: [birth year] Today's Date: 12/22/2013   History of Present Illness  Patient is a 80 y/o male admitted with word finding difficulty, slurred speech, facial droop and right-sided numbness which has now resolved per pt report. He states that 3 weeks ago, he had transient word finding difficulties and right-sided numbness which lasted less than an hour and resolved spontaneously. On 11/6, patient woke up and noticed difficulty speaking and word finding difficulty; spouse noticed slurred speech and face leaning towards the left and patient also complained of bilateral numbness more so on the right lower extremity. PMH of diet-controlled DM 2, dyslipidemia, prior TIA/?stroke on Plavix and COPD. CT head- negative. Workup pending.    Clinical Impression  Patient presents close to functional baseline from a mobility stand point and able to safely ambulate community distances and negotiate steps without difficulty. Pt's symptoms of numbness and facial droop have resolved. Pt does not require skilled therapy services at this time. Encourage daily ambulation in hallway to maintain strength/mobility while in hospital. RN made aware. Discharge from therapy. Please re-consult if any change in functional mobility.     Follow Up Recommendations No PT follow up;Supervision - Intermittent    Equipment Recommendations  None recommended by PT    Recommendations for Other Services       Precautions / Restrictions Precautions Precautions: None Restrictions Weight Bearing Restrictions: No      Mobility  Bed Mobility Overal bed mobility: Independent                Transfers Overall transfer level: Independent                  Ambulation/Gait Ambulation/Gait assistance: Independent Ambulation Distance (Feet): 300 Feet Assistive device: None Gait Pattern/deviations: Step-through pattern   Gait velocity  interpretation: at or above normal speed for age/gender General Gait Details: Safe gait pattern. No LOB or difficulty. Mild dyspnea however pt reports this as baseline. Vitals stable.  Stairs Stairs: Yes Stairs assistance: Modified independent (Device/Increase time) Stair Management: One rail Right;Alternating pattern Number of Stairs: 11 General stair comments: Safe stair negotiation technique.  Wheelchair Mobility    Modified Rankin (Stroke Patients Only)       Balance Overall balance assessment: Needs assistance   Sitting balance-Leahy Scale: Normal Sitting balance - Comments: Able to donn socks sitting EOB without difficulty or LOB reaching outside BoS.   Standing balance support: During functional activity Standing balance-Leahy Scale: Good                               Pertinent Vitals/Pain Pain Assessment: No/denies pain    Home Living Family/patient expects to be discharged to:: Private residence Living Arrangements: Spouse/significant other Available Help at Discharge: Family;Available 24 hours/day Type of Home: House Home Access: Stairs to enter Entrance Stairs-Rails: Right Entrance Stairs-Number of Steps: 3 Home Layout: Multi-level Home Equipment: None      Prior Function Level of Independence: Independent         Comments: Pt very active PTA. Plays golf occasionally.     Hand Dominance        Extremity/Trunk Assessment   Upper Extremity Assessment: Defer to OT evaluation           Lower Extremity Assessment: Overall WFL for tasks assessed         Communication   Communication: No difficulties (No obvious expressive difficulties.)  Cognition Arousal/Alertness: Awake/alert Behavior During Therapy: WFL for tasks assessed/performed Overall Cognitive Status: No family/caregiver present to determine baseline cognitive functioning                      General Comments General comments (skin integrity, edema, etc.):  Pt able to recall the president of the [place], A&O x4.    Exercises        Assessment/Plan    PT Assessment Patent does not need any further PT services  PT Diagnosis Difficulty walking   PT Problem List    PT Treatment Interventions     PT Goals (Current goals can be found in the Care Plan section) Acute Rehab PT Goals PT Goal Formulation: All assessment and education complete, DC therapy    Frequency     Barriers to discharge        Co-evaluation               End of Session Equipment Utilized During Treatment: Gait belt Activity Tolerance: Patient tolerated treatment well Patient left: in bed;with call bell/phone within reach Nurse Communication: Mobility status    Functional Assessment Tool Used: Clinical judgment. Functional Limitation: Mobility: Walking and moving around Mobility: Walking and Moving Around Current Status [phone]): At least 1 percent but less than 20 percent impaired, limited or restricted Mobility: Walking and Moving Around Goal Status [phone]): 0 percent impaired, limited or restricted Mobility: Walking and Moving Around Discharge Status [phone]): 0 percent impaired, limited or restricted    Time: 4174-0814 PT Time Calculation (min): 17 min   Charges:   PT Evaluation $Initial PT Evaluation Tier I: 1 Procedure PT Treatments $Gait Training: 8-22 mins   PT G Codes:   Functional Assessment Tool Used: Clinical judgment. Functional Limitation: Mobility: Walking and moving around    [place], [place] 12/22/2013, 4:52 PM Candy Sledge, PT, DPT [phone]

## 2013-12-23 DIAGNOSIS — I639 Cerebral infarction, unspecified: Secondary | ICD-10-CM | POA: Diagnosis present

## 2013-12-23 DIAGNOSIS — R4781 Slurred speech: Secondary | ICD-10-CM | POA: Diagnosis present

## 2013-12-23 DIAGNOSIS — K219 Gastro-esophageal reflux disease without esophagitis: Secondary | ICD-10-CM | POA: Diagnosis present

## 2013-12-23 DIAGNOSIS — J45909 Unspecified asthma, uncomplicated: Secondary | ICD-10-CM | POA: Diagnosis present

## 2013-12-23 DIAGNOSIS — R569 Unspecified convulsions: Secondary | ICD-10-CM

## 2013-12-23 DIAGNOSIS — G459 Transient cerebral ischemic attack, unspecified: Secondary | ICD-10-CM

## 2013-12-23 DIAGNOSIS — Z79899 Other long term (current) drug therapy: Secondary | ICD-10-CM | POA: Diagnosis not present

## 2013-12-23 DIAGNOSIS — Z7951 Long term (current) use of inhaled steroids: Secondary | ICD-10-CM | POA: Diagnosis not present

## 2013-12-23 DIAGNOSIS — I369 Nonrheumatic tricuspid valve disorder, unspecified: Secondary | ICD-10-CM

## 2013-12-23 DIAGNOSIS — Z8673 Personal history of transient ischemic attack (TIA), and cerebral infarction without residual deficits: Secondary | ICD-10-CM | POA: Diagnosis not present

## 2013-12-23 DIAGNOSIS — Z7902 Long term (current) use of antithrombotics/antiplatelets: Secondary | ICD-10-CM | POA: Diagnosis not present

## 2013-12-23 DIAGNOSIS — J449 Chronic obstructive pulmonary disease, unspecified: Secondary | ICD-10-CM | POA: Diagnosis present

## 2013-12-23 DIAGNOSIS — E119 Type 2 diabetes mellitus without complications: Secondary | ICD-10-CM | POA: Diagnosis present

## 2013-12-23 DIAGNOSIS — E785 Hyperlipidemia, unspecified: Secondary | ICD-10-CM | POA: Diagnosis present

## 2013-12-23 LAB — GLUCOSE, CAPILLARY
GLUCOSE-CAPILLARY: 110 mg/dL — AB (ref 70–99)
Glucose-Capillary: 119 mg/dL — ABNORMAL HIGH (ref 70–99)

## 2013-12-23 LAB — LIPID PANEL
CHOLESTEROL: 152 mg/dL (ref 0–200)
HDL: 42 mg/dL (ref >39)
LDL Cholesterol: 76 mg/dL (ref 0–99)
Total CHOL/HDL Ratio: 3.6 RATIO
Triglycerides: 168 mg/dL — ABNORMAL HIGH (ref <150)
VLDL: 34 mg/dL (ref 0–40)

## 2013-12-23 MED ORDER — MAGNESIUM HYDROXIDE 400 MG/5ML PO SUSP
30.0000 mL | Freq: Every day | ORAL | Status: DC | PRN
  Administered 2013-12-23: 30 mL via ORAL
  Filled 2013-12-23: qty 30

## 2013-12-23 MED ORDER — SIMVASTATIN 40 MG PO TABS: 40.0000 mg | ORAL_TABLET | ORAL | Status: DC

## 2013-12-23 MED ORDER — MAGNESIUM HYDROXIDE 400 MG/5ML PO SUSP: 30.0000 mL | Freq: Two times a day (BID) | ORAL | Status: DC

## 2013-12-23 NOTE — Plan of Care (Signed)
Problem: Acute Treatment Outcomes Goal: Prognosis discussed with family/patient as appropriate Outcome: Completed/Met Date Met:  12/23/13  Problem: Progression Outcomes Goal: Communication method established Outcome: Completed/Met Date Met:  12/23/13 Goal: Rehab Team goals identified Outcome: Not Applicable Date Met:  93/26/71

## 2013-12-23 NOTE — Progress Notes (Signed)
VASCULAR LAB PRELIMINARY  PRELIMINARY  PRELIMINARY  PRELIMINARY  Carotid Dopplers completed.    Preliminary report:  1-39% ICA stenosis.  Vertebral artery flow is antegrade.   Geraldyne Barraclough, RVT 12/23/2013, 3:28 PM

## 2013-12-23 NOTE — Plan of Care (Signed)
Problem: Discharge/Transitional Outcomes Goal: PCP appointment made and transportation plan in place Outcome: Adequate for Discharge

## 2013-12-23 NOTE — Discharge Summary (Signed)
[name], is a 80 y.o. male  DOB 1932-01-23  MRN [id number].  Admission date:  12/22/2013  Admitting Physician  Modena Jansky, MD  Discharge Date:  12/23/2013   Primary MD  Dorian Heckle, MD  Recommendations for primary care physician for things to follow:   Follow CBC, BMP and A1c.  Needs outpatient Holter monitor with cardiology and neurology follow-up   Admission Diagnosis  Transient cerebral ischemia, unspecified transient cerebral ischemia type [G45.9]   Discharge Diagnosis  Transient cerebral ischemia, unspecified transient cerebral ischemia type [G45.9]    Principal Problem:   TIA (transient ischemic attack) Active Problems:   Dyslipidemia   Diabetes mellitus type 2, diet-controlled   Seizures      Past Medical History  Diagnosis Date  . Diverticulosis   . Hiatal hernia   . Colitis   . Hemorrhoids   . Colon polyps   . Proctitis   . GERD (gastroesophageal reflux disease)   . Osteoarthritis   . Asthma   . COPD (chronic obstructive pulmonary disease)   . Status post dilation of esophageal narrowing   . HLD (hyperlipidemia)   . Stroke   . Complication of anesthesia   . PONV (postoperative nausea and vomiting)   . DM (diabetes mellitus)     dr Meyer Cory    Past Surgical History  Procedure Laterality Date  . Nissen    . Splenectomy, total    . Esophageal hernia    . Abdominal hernia repair    . Ventral hernia repair  01/29/2012     WITH MESH  . Ventral hernia repair  01/29/2012    Procedure: HERNIA REPAIR VENTRAL ADULT;  Surgeon: Gwenyth Ober, MD;  Location: [facility];  Service: General;  Laterality: N/A;  open recurrent ventral hernia repair with mesh  . Insertion of mesh  01/29/2012    Procedure: INSERTION OF MESH;  Surgeon: Gwenyth Ober, MD;  Location: [facility];  Service: General;  Laterality:  N/A;       History of present illness and  Hospital Course:     Kindly see H&P for history of present illness and admission details, please review complete Labs, Consult reports and Test reports for all details in brief  HPI  from the history and physical done on the day of admission  [name] is a 80 y.o. male with history of diet-controlled DM 2, last A1c 6.7, dyslipidemia, prior TIA/?stroke on Plavix, presented to the ED with above complaints. He states that 3 weeks ago, he had transient word finding difficulties and right-sided numbness which lasted less than an hour and resolved spontaneously. His neighbor who is a physician came to his house, assessed him and patient did not seek any other medical attention. On 12/22/13 at approximately 6:30 AM, patient woke up and noticed difficulty speaking, word finding difficulty, spouse noticed slurred speech and face leaning towards the left and patient also complained of bilateral numbness more so on the right lower extremity. This was not associated  with asymmetric limb weakness. Patient denied headache, dizziness, lightheadedness, chest pain or palpitations. The slurred speech and facial asymmetry gradually resolved over the next hour. Word finding difficulty has improved but has not completely resolved. Numbness of his limbs has also resolved. He claims compliance to his Plavix. He states that he used to be on aspirin before he started Plavix but cannot remember why it was switched. In the ED, Hospitalist admission requested.  Hospital Course   1.Left frontoparietal ischemic CVA - all symptoms completely resolved, echogram carotid duplex stable, LDL greater than 70 therefore starting dose increased, Plavix per neurology upon discharge, A1c is 6.7 he is likely type II diabetic, request PCP to monitor glycemic control and initiate age-appropriate therapy within a week.  We will require outpatient Holter monitor requested to follow with cardiology  in a week.   2. A1c of 6.7. Diet-controlled type II diabetic. Request PCP to monitor glycemic control closely, consider starting oral hypoglycemic agent versus Glucophage.   3. Dyslipidemia. LDL greater than 70. Statin dose increased.   4. Recent outpatient diagnosis of UTI complete oral Cipro course which he was taking outpatient UA here unimpressive.       Discharge Condition: stable   Follow UP  Follow-up Information    Follow up with Dorian Heckle, MD. Schedule an appointment as soon as possible for a visit in 1 week.   Specialty:  Internal Medicine   Contact information:   [address] [place] [address] [phone]       Follow up with [facility]. Go in 4 weeks.   Contact information:   [address] [place] [id number] [phone]      Follow up with Virl Axe, MD. Schedule an appointment as soon as possible for a visit in 1 week.   Specialty:  Cardiology   Why:  haltor monitor   Contact information:   [address] [place] [address] [phone]         Discharge Instructions  and  Discharge Medications      Discharge Instructions    Diet - low sodium heart healthy    Complete by:  As directed      Discharge instructions    Complete by:  As directed   Follow with Primary MD Dorian Heckle, MD in 7 days   Get CBC, CMP, A!c, 2 view Chest X ray checked  by Primary MD next visit.    Activity: As tolerated with Full fall precautions use walker/cane & assistance as needed   Disposition Home     Diet: Heart Healthy    For Heart failure patients - Check your Weight same time everyday, if you gain over 2 pounds, or you develop in leg swelling, experience more shortness of breath or chest pain, call your Primary MD immediately. Follow Cardiac Low Salt Diet and 1.8 lit/day fluid restriction.   On your next visit with your primary care physician please Get  Medicines reviewed and adjusted.   Please request your Prim.MD to go over all Hospital Tests and Procedure/Radiological results at the follow up, please get all Hospital records sent to your Prim MD by signing hospital release before you go home.   If you experience worsening of your admission symptoms, develop shortness of breath, life threatening emergency, suicidal or homicidal thoughts you must seek medical attention immediately by calling 911 or calling your MD immediately  if symptoms less severe.  You Must read  complete instructions/literature along with all the possible adverse reactions/side effects for all the Medicines you take and that have been prescribed to you. Take any new Medicines after you have completely understood and accpet all the possible adverse reactions/side effects.   Do not drive, operating heavy machinery, perform activities at heights, swimming or participation in water activities or provide baby sitting services if your were admitted for syncope or siezures until you have seen by Primary MD or a Neurologist and advised to do so again.  Do not drive when taking Pain medications.    Do not take more than prescribed Pain, Sleep and Anxiety Medications  Special Instructions: If you have smoked or chewed Tobacco  in the last 2 yrs please stop smoking, stop any regular Alcohol  and or any Recreational drug use.  Wear Seat belts while driving.   Please note  You were cared for by a hospitalist during your hospital stay. If you have any questions about your discharge medications or the care you received while you were in the hospital after you are discharged, you can call the unit and asked to speak with the hospitalist on call if the hospitalist that took care of you is not available. Once you are discharged, your primary care physician will handle any further medical issues. Please note that NO REFILLS for any discharge medications will be authorized once you are  discharged, as it is imperative that you return to your primary care physician (or establish a relationship with a primary care physician if you do not have one) for your aftercare needs so that they can reassess your need for medications and monitor your lab values.     Increase activity slowly    Complete by:  As directed             Medication List    TAKE these medications        ciprofloxacin 500 MG tablet  Commonly known as:  CIPRO  Take 500 mg by mouth daily. Started on 11/4, finish on 11/7     CITRUCEL oral powder  Generic drug:  methylcellulose  Take 1 packet by mouth daily.     clopidogrel 75 MG tablet  Commonly known as:  PLAVIX  Take 75 mg by mouth daily.     dicyclomine 10 MG capsule  Commonly known as:  BENTYL  Take 10 mg by mouth 2 (two) times daily.     fluticasone-salmeterol 115-21 MCG/ACT inhaler  Commonly known as:  ADVAIR HFA  Inhale 2 puffs into the lungs 2 (two) times daily.     Magnesium Gluconate 500 (27 MG) MG Tabs  Take 500 mg by mouth at bedtime.     metoCLOPramide 5 MG tablet  Commonly known as:  REGLAN  Take 1 tablet (5 mg total) by mouth 3 (three) times daily before meals.     multivitamin with minerals Tabs tablet  Take 1 tablet by mouth daily.     oxyCODONE-acetaminophen 5-325 MG per tablet  Commonly known as:  PERCOCET/ROXICET  Take 1-2 tablets by mouth every 4 (four) hours as needed.     simvastatin 40 MG tablet  Commonly known as:  ZOCOR  Take 1 tablet (40 mg total) by mouth every other day.     traMADol 50 MG tablet  Commonly known as:  ULTRAM  Take 50 mg by mouth every 6 (six) hours.     zolpidem 5 MG tablet  Commonly known as:  AMBIEN  Take 1 tablet (  5 mg total) by mouth at bedtime as needed for sleep.          Diet and Activity recommendation: See Discharge Instructions above   Consults obtained - Neuro   Major procedures and Radiology Reports - PLEASE review detailed and final reports for all details, in  brief -       Ct Head Wo Contrast  12/22/2013   CLINICAL DATA:  TIA.  Slurred speech  EXAM: CT HEAD WITHOUT CONTRAST  TECHNIQUE: Contiguous axial images were obtained from the base of the skull through the vertex without intravenous contrast.  COMPARISON:  MRI 12/08/2006  FINDINGS: Generalized atrophy with progression. Chronic microvascular ischemic change has progressed.  Negative for acute infarct.  Negative for hemorrhage or mass lesion.  Atherosclerotic disease. Sinusitis with mucosal thickening and air-fluid levels. Calvarium intact.  IMPRESSION: Progression of atrophy and chronic microvascular ischemia. No acute abnormality.  Sinusitis with air-fluid levels   Electronically Signed   By: Franchot Gallo M.D.   On: 12/22/2013 12:15   Mri Brain Without Contrast  12/22/2013   CLINICAL DATA:  Initial evaluation for speech difficulty, word finding difficulty. Bilateral numbness. Evaluate for stroke.  EXAM: MRI HEAD WITHOUT CONTRAST  MRA HEAD WITHOUT CONTRAST  TECHNIQUE: Multiplanar, multiecho pulse sequences of the brain and surrounding structures were obtained without intravenous contrast. Angiographic images of the head were obtained using MRA technique without contrast.  COMPARISON:  Prior noncontrast head CT from earlier the same day as well as previous MRI from 12/08/2006.  FINDINGS: MRI HEAD FINDINGS  Diffuse prominence of the CSF containing spaces is compatible with generalized age-related cerebral atrophy. Patchy and confluent T2/FLAIR hyperintensity within the periventricular and deep white matter, most delayed compatible with moderate chronic small vessel ischemic disease. Probable small remote lacunar infarct present within the right basal ganglia.  There is a linear 1.8 cm focus of restricted diffusion within the posterior left frontotemporal region, compatible with acute ischemic infarct (series 4, image 17). The infarct involves predominantly the subcortical, deep, and periventricular white  matter. No significant mass effect. No associated hemorrhage. No other acute ischemic infarct.  No mass lesion or midline shift. No extra-axial fluid collection. Ventricular prominence related to global atrophy present without hydrocephalus.  Pituitary gland is normal. Craniocervical junction within normal limits. No acute abnormality seen about the orbits.  Degenerative changes noted within the visualized upper cervical spine. Bone marrow signal intensity within normal limits.  Small amount of layering fluid present within the right sphenoid sinus. There is mild mucoperiosteal thickening within the maxillary sinuses, ethmoidal air cells, and frontal sinuses. No mastoid effusion.  MRA HEAD FINDINGS  ANTERIOR CIRCULATION:  The visualized distal cervical segments of the internal carotid arteries are widely patent with antegrade flow. Petrous, cavernous, and supra clinoid segments are widely patent. A1 segments, anterior communicating artery, and anterior cerebral arteries well opacified.  M1 segments are well opacified bilaterally without hemodynamically significant stenosis or proximal branch occlusion. MCA bifurcations normal. Distal MCA branches well opacified.  POSTERIOR CIRCULATION:  Vertebral arteries well opacified bilaterally and appear codominant. Posterior inferior cerebral arteries are patent. Vertebrobasilar junction and basilar artery are unremarkable. Posterior cerebral and superior cerebellar arteries are well opacified bilaterally.  No aneurysm or vascular malformation within the intracranial circulation.  IMPRESSION: MRI HEAD IMPRESSION:  1. 1.8 cm linear ischemic infarct within the posterior left frontotemporal region without significant mass effect or hemorrhage. 2. Atrophy with moderate chronic small vessel ischemic disease. 3. Small remote lacunar infarcts within the right  basal ganglia.  MRA HEAD IMPRESSION:  No proximal branch occlusion or hemodynamically significant stenosis identified within  the intracranial circulation.   Electronically Signed   By: Jeannine Boga M.D.   On: 12/22/2013 21:17   [name] Head/brain Wo Cm  12/22/2013   CLINICAL DATA:  Initial evaluation for speech difficulty, word finding difficulty. Bilateral numbness. Evaluate for stroke.  EXAM: MRI HEAD WITHOUT CONTRAST  MRA HEAD WITHOUT CONTRAST  TECHNIQUE: Multiplanar, multiecho pulse sequences of the brain and surrounding structures were obtained without intravenous contrast. Angiographic images of the head were obtained using MRA technique without contrast.  COMPARISON:  Prior noncontrast head CT from earlier the same day as well as previous MRI from 12/08/2006.  FINDINGS: MRI HEAD FINDINGS  Diffuse prominence of the CSF containing spaces is compatible with generalized age-related cerebral atrophy. Patchy and confluent T2/FLAIR hyperintensity within the periventricular and deep white matter, most delayed compatible with moderate chronic small vessel ischemic disease. Probable small remote lacunar infarct present within the right basal ganglia.  There is a linear 1.8 cm focus of restricted diffusion within the posterior left frontotemporal region, compatible with acute ischemic infarct (series 4, image 17). The infarct involves predominantly the subcortical, deep, and periventricular white matter. No significant mass effect. No associated hemorrhage. No other acute ischemic infarct.  No mass lesion or midline shift. No extra-axial fluid collection. Ventricular prominence related to global atrophy present without hydrocephalus.  Pituitary gland is normal. Craniocervical junction within normal limits. No acute abnormality seen about the orbits.  Degenerative changes noted within the visualized upper cervical spine. Bone marrow signal intensity within normal limits.  Small amount of layering fluid present within the right sphenoid sinus. There is mild mucoperiosteal thickening within the maxillary sinuses, ethmoidal air cells,  and frontal sinuses. No mastoid effusion.  MRA HEAD FINDINGS  ANTERIOR CIRCULATION:  The visualized distal cervical segments of the internal carotid arteries are widely patent with antegrade flow. Petrous, cavernous, and supra clinoid segments are widely patent. A1 segments, anterior communicating artery, and anterior cerebral arteries well opacified.  M1 segments are well opacified bilaterally without hemodynamically significant stenosis or proximal branch occlusion. MCA bifurcations normal. Distal MCA branches well opacified.  POSTERIOR CIRCULATION:  Vertebral arteries well opacified bilaterally and appear codominant. Posterior inferior cerebral arteries are patent. Vertebrobasilar junction and basilar artery are unremarkable. Posterior cerebral and superior cerebellar arteries are well opacified bilaterally.  No aneurysm or vascular malformation within the intracranial circulation.  IMPRESSION: MRI HEAD IMPRESSION:  1. 1.8 cm linear ischemic infarct within the posterior left frontotemporal region without significant mass effect or hemorrhage. 2. Atrophy with moderate chronic small vessel ischemic disease. 3. Small remote lacunar infarcts within the right basal ganglia.  MRA HEAD IMPRESSION:  No proximal branch occlusion or hemodynamically significant stenosis identified within the intracranial circulation.   Electronically Signed   By: Jeannine Boga M.D.   On: 12/22/2013 21:17    Micro Results      No results found for this or any previous visit (from the past 240 hour(s)).     Today   Subjective:   [name] today has no headache,no chest abdominal pain,no new weakness tingling or numbness, feels much better wants to go home today.    Objective:   Blood pressure 144/78, pulse 79, temperature 97.5 F (36.4 C), temperature source Oral, resp. rate 18, height 5\' 10"  (1.778 m), weight 77.111 kg (170 lb), SpO2 98 %.  No intake or output data in the 24 hours  ending 12/23/13  1625  Exam Awake Alert, Oriented x 3, No new F.N deficits, Normal affect [place].AT,PERRAL Supple Neck,No JVD, No cervical lymphadenopathy appriciated.  Symmetrical Chest wall movement, Good air movement bilaterally, CTAB RRR,No Gallops,Rubs or new Murmurs, No Parasternal Heave +ve B.Sounds, Abd Soft, Non tender, No organomegaly appriciated, No rebound -guarding or rigidity. No Cyanosis, Clubbing or edema, No new Rash or bruise  Data Review   CBC w Diff:  Lab Results  Component Value Date   WBC 10.3 12/22/2013   HGB 16.8 12/22/2013   HCT 50.2 12/22/2013   PLT 480* 12/22/2013   LYMPHOPCT 26 12/22/2013   MONOPCT 8 12/22/2013   EOSPCT 3 12/22/2013   BASOPCT 1 12/22/2013    CMP:  Lab Results  Component Value Date   NA 138 12/22/2013   K 3.8 12/22/2013   CL 100 12/22/2013   CO2 26 12/22/2013   BUN 12 12/22/2013   CREATININE 0.83 12/22/2013   CREATININE 0.74 02/08/2012   PROT 7.2 12/22/2013   ALBUMIN 3.5 12/22/2013   BILITOT 0.3 12/22/2013   ALKPHOS 88 12/22/2013   AST 17 12/22/2013   ALT 15 12/22/2013  . Lab Results  Component Value Date   CHOL 152 12/23/2013   HDL 42 12/23/2013   LDLCALC 76 12/23/2013   TRIG 168* 12/23/2013   CHOLHDL 3.6 12/23/2013    Lab Results  Component Value Date   HGBA1C 6.7* 12/22/2013     Total Time in preparing paper work, data evaluation and todays exam - 35 minutes  Thurnell Lose M.D on 12/23/2013 at [facility]  [phone]

## 2013-12-23 NOTE — Plan of Care (Signed)
Problem: Consults Goal: Ischemic Stroke Patient Education See Patient Education Module for education specifics.  Outcome: Completed/Met Date Met:  12/23/13     

## 2013-12-23 NOTE — Progress Notes (Signed)
Internal med physician, Dr. Ronnie Derby and Mikey Bussing, PA, neurology notified of completion of ECHO and carotid dopplers.  Will await results of dopplers and probably discharge close to supper time.

## 2013-12-23 NOTE — Progress Notes (Signed)
STROKE TEAM PROGRESS NOTE   HISTORY [name] is an 80 y.o. male who states 3 weeks ago he had a period of time where he was having difficulty finding words and some tingling in his right hand. This lasted for about one hour and then cleared. He did not seek medical attention at that time. This AM he woke up at around 0630 and noted he had difficulty finding words and decreased sensation in his right hand. He was brought to ED where he continued to have word finding difficulty but feels this has improved significantly. He no longer has decreased sensation in his right hand. HE states he has taken his Plavix daily and not missed one dose.   Date last known well: Date: 12/21/2013 Time last known well: Time: 22:00 tPA Given: No: out of window     SUBJECTIVE (INTERVAL HISTORY) Patient arrives the bedside. The patient feels he is back to baseline. The patient denies any history of palpitations or rapid heart rate.   OBJECTIVE Temp:  [97.5 F (36.4 C)-98.6 F (37 C)] 97.7 F (36.5 C) (11/07 0603) Pulse Rate:  [64-98] 64 (11/07 0603) Cardiac Rhythm:  [-] Normal sinus rhythm;Heart block (11/06 1900) Resp:  [15-22] 18 (11/07 0603) BP: (115-157)/(58-123) 127/75 mmHg (11/07 0603) SpO2:  [96 %-99 %] 97 % (11/07 0603) Weight:  [170 lb (77.111 kg)] 170 lb (77.111 kg) (11/06 2021)   Recent Labs Lab 12/22/13 1628 12/22/13 2125 12/23/13 0651  GLUCAP 88 98 110*    Recent Labs Lab 12/22/13 1020  NA 138  K 3.8  CL 100  CO2 26  GLUCOSE 134*  BUN 12  CREATININE 0.83  CALCIUM 9.1    Recent Labs Lab 12/22/13 1020  AST 17  ALT 15  ALKPHOS 88  BILITOT 0.3  PROT 7.2  ALBUMIN 3.5    Recent Labs Lab 12/22/13 1020  WBC 10.3  NEUTROABS 6.5  HGB 16.8  HCT 50.2  MCV 96.9  PLT 480*   No results for input(s): CKTOTAL, CKMB, CKMBINDEX, TROPONINI in the last 168 hours.  Recent Labs  12/22/13 1020  LABPROT 13.0  INR 0.98    Recent Labs  12/22/13 1210  COLORURINE  YELLOW  LABSPEC 1.015  PHURINE 8.0  GLUCOSEU NEGATIVE  HGBUR NEGATIVE  BILIRUBINUR NEGATIVE  KETONESUR NEGATIVE  PROTEINUR NEGATIVE  UROBILINOGEN 0.2  NITRITE NEGATIVE  LEUKOCYTESUR NEGATIVE       Component Value Date/Time   CHOL 152 12/23/2013 0530   TRIG 168* 12/23/2013 0530   HDL 42 12/23/2013 0530   CHOLHDL 3.6 12/23/2013 0530   VLDL 34 12/23/2013 0530   LDLCALC 76 12/23/2013 0530   Lab Results  Component Value Date   HGBA1C 6.7* 12/22/2013      Component Value Date/Time   LABOPIA NONE DETECTED 12/22/2013 1500   COCAINSCRNUR NONE DETECTED 12/22/2013 1500   LABBENZ NONE DETECTED 12/22/2013 1500   AMPHETMU NONE DETECTED 12/22/2013 1500   THCU NONE DETECTED 12/22/2013 1500   LABBARB NONE DETECTED 12/22/2013 1500    No results for input(s): ETH in the last 168 hours.  Ct Head Wo Contrast 12/22/2013    Progression of atrophy and chronic microvascular ischemia. No acute abnormality.  Sinusitis with air-fluid levels      Mri Brain Without Contrast 12/22/2013    MRI HEAD   1. 1.8 cm linear ischemic infarct within the posterior left frontotemporal region without significant mass effect or hemorrhage.  2. Atrophy with moderate chronic small vessel ischemic disease.  3. Small remote lacunar infarcts within the right basal ganglia.    MRA HEAD   No proximal branch occlusion or hemodynamically significant stenosis identified within the intracranial circulation.         PHYSICAL EXAM Neurologic Examination:  Mental Status:  Alert, oriented, thought content appropriate. Speech without evidence of dysarthria or aphasia. Able to follow 3 step commands without difficulty.  Cranial Nerves:  II-bilateral visual fields intact III/IV/VI-Pupils were equal and reacted. Extraocular movements were full and conjugate.  V/VII-no facial numbness and no facial weakness.  VIII-normal.  X-normal speech and symmetrical palatal movement.  XII-midline tongue extension  Motor: 5/5  strength symmetrical throughout.  Muscle tone normal throughout. Sensory: Intact to light touch in all extremities. Deep Tendon Reflexes: 2/4 throughout Plantars: Downgoing bilaterally  Cerebellar: Normal finger to nose and heel to shin bilaterally.    ASSESSMENT/PLAN  [name] is an 80 y.o. male with history of previous stroke, hyperlipidemia, and diabetes mellitus presenting with speech difficulties and decreased sensation in his right hand. He did not receive IV t-PA due to late presentation.   Stroke Dominant - infarct secondary to small vessel disease vs embolic of unknown etiology.  Resultant - resolution of deficits  MRI  1.8 cm linear ischemic infarct within the posterior left frontotemporal region. Small remote lacunar infarcts within the right basal ganglia.    MRA  No proximal branch occlusion or hemodynamically significant stenosis  Carotid Doppler  pending  2D Echo  pending  LDL 76 - Zocor 20 mg daily prior to admission  HgbA1c 6.7  Lovenox for VTE prophylaxis  Diet Carb Modified with thin liquids  clopidogrel 75 mg orally every day prior to admission, now on clopidogrel 75 mg orally every day  Patient counseled to be compliant with his antithrombotic medications  Ongoing aggressive risk factor management  Therapy recommendations:  No follow-up therapies recommended. Intermittent supervision recommended.  Disposition:  Possible discharge later today if Doppler and echo results available.  Hypertension  Home meds:  No antihypertensive medications prior to admission.  Stable  Hyperlipidemia  Home meds:  Zocor 20 mg daily -  resumed in hospital  LDL 76, goal < 70  Increase Zocor to 40 mg daily  Continue statin at discharge  Diabetes  HgbA1c 6.7 goal < 7.0  Controlled  Other Stroke Risk Factors Advanced age ETOH use Hx stroke/TIA  Addendum: 2-D echo and carotid Dopplers unremarkable.    Recommend outpatient thirty day event  monitor to rule out atrial fibrillation as possible source for embolic infarcts. The patient has not seen a cardiologist in the past; however, he requests Dr. Wynonia Lawman.  Follow-up in the office with Dr. Erlinda Hong in 4-6 weeks.   Hospital day # 1 [name] [name] Pager [phone] 12/23/2013, 8:37 AM     To contact Stroke Continuity provider, please refer to [email or web address] After hours, contact General Neurology

## 2013-12-23 NOTE — Progress Notes (Signed)
  Echocardiogram 2D Echocardiogram has been performed.  [name] 12/23/2013, 9:10 AM

## 2013-12-23 NOTE — Progress Notes (Signed)
Introduced self to patient and instructed patient to call number for nurse and tech for assistance throughout the day.  Patient eating breakfast at this time.  Will return at 0800 to do complete head to toe assessment. Patient is alert and oriented and able to ambulate independently in his room.  Will continue to monitor.

## 2013-12-23 NOTE — Plan of Care (Signed)
Problem: Progression Outcomes Goal: Progressive activity as tolerated Outcome: Completed/Met Date Met:  12/23/13 Goal: Tolerating diet/TF at goal rate Outcome: Completed/Met Date Met:  12/23/13 Goal: Pain controlled Outcome: Not Applicable Date Met:  98/02/21 Goal: Bowel & Bladder Continence Outcome: Completed/Met Date Met:  12/23/13 Goal: Educational plan initiated Outcome: Completed/Met Date Met:  12/23/13 Goal: Initial discharge plan initiated Outcome: Completed/Met Date Met:  12/23/13  Problem: Discharge/Transitional Outcomes Goal: Educational Plan Complete Outcome: Completed/Met Date Met:  12/23/13 Goal: Hemodynamically stable Outcome: Completed/Met Date Met:  12/23/13 Goal: Independent mobility/functioning independent or with min Independent mobility/functioning independently or with minimal assistance  Outcome: Completed/Met Date Met:  12/23/13 Goal: Tolerating diet/TF at goal rate-PEG if inadequate intake Outcome: Completed/Met Date Met:  12/23/13 Goal: INR monitor plan established Outcome: Not Applicable Date Met:  79/81/02 Goal: Family and patient agree upon discharge plan Outcome: Completed/Met Date Met:  12/23/13 Goal: Family/Caregiver willing and able to support plan Family/Caregiver willing and able to support plan for self-management after transition home  Outcome: Completed/Met Date Met:  12/23/13 Goal: PCP appointment made and transportation plan in place Outcome: Not Met (add Reason) Not able to make appointments on weekend.  Patient/wife will make appointments on Monday. Goal: Ability to attain medications upon leaving hospital Outcome: Completed/Met Date Met:  12/23/13

## 2013-12-23 NOTE — Progress Notes (Signed)
UR completed 

## 2013-12-23 NOTE — Discharge Instructions (Signed)
Follow with Primary MD Dorian Heckle, MD in 7 days   Get CBC, CMP, A!c, 2 view Chest X ray checked  by Primary MD next visit.    Activity: As tolerated with Full fall precautions use walker/cane & assistance as needed   Disposition Home     Diet: Heart Healthy    For Heart failure patients - Check your Weight same time everyday, if you gain over 2 pounds, or you develop in leg swelling, experience more shortness of breath or chest pain, call your Primary MD immediately. Follow Cardiac Low Salt Diet and 1.8 lit/day fluid restriction.   On your next visit with your primary care physician please Get Medicines reviewed and adjusted.   Please request your Prim.MD to go over all Hospital Tests and Procedure/Radiological results at the follow up, please get all Hospital records sent to your Prim MD by signing hospital release before you go home.   If you experience worsening of your admission symptoms, develop shortness of breath, life threatening emergency, suicidal or homicidal thoughts you must seek medical attention immediately by calling 911 or calling your MD immediately  if symptoms less severe.  You Must read complete instructions/literature along with all the possible adverse reactions/side effects for all the Medicines you take and that have been prescribed to you. Take any new Medicines after you have completely understood and accpet all the possible adverse reactions/side effects.   Do not drive, operating heavy machinery, perform activities at heights, swimming or participation in water activities or provide baby sitting services if your were admitted for syncope or siezures until you have seen by Primary MD or a Neurologist and advised to do so again.  Do not drive when taking Pain medications.    Do not take more than prescribed Pain, Sleep and Anxiety Medications  Special Instructions: If you have smoked or chewed Tobacco  in the last 2 yrs please stop smoking, stop any  regular Alcohol  and or any Recreational drug use.  Wear Seat belts while driving.   Please note  You were cared for by a hospitalist during your hospital stay. If you have any questions about your discharge medications or the care you received while you were in the hospital after you are discharged, you can call the unit and asked to speak with the hospitalist on call if the hospitalist that took care of you is not available. Once you are discharged, your primary care physician will handle any further medical issues. Please note that NO REFILLS for any discharge medications will be authorized once you are discharged, as it is imperative that you return to your primary care physician (or establish a relationship with a primary care physician if you do not have one) for your aftercare needs so that they can reassess your need for medications and monitor your lab values.

## 2013-12-23 NOTE — Plan of Care (Signed)
Problem: Acute Treatment Outcomes Goal: Neuro exam at baseline or improved Outcome: Completed/Met Date Met:  12/23/13 Goal: BP within ordered parameters Outcome: Completed/Met Date Met:  12/23/13 Goal: Airway maintained/protected Outcome: Completed/Met Date Met:  12/23/13 Goal: 02 Sats > 94% Outcome: Completed/Met Date Met:  12/23/13 Goal: Hemodynamically stable Outcome: Completed/Met Date Met:  12/23/13

## 2014-01-06 NOTE — Plan of Care (Signed)
Problem: Discharge/Transitional Outcomes Goal: PCP appointment made and transportation plan in place Outcome: Completed/Met Date Met:  01/06/14

## 2014-01-29 ENCOUNTER — Encounter: Payer: Self-pay | Admitting: Cardiology

## 2014-01-29 DIAGNOSIS — Z8719 Personal history of other diseases of the digestive system: Secondary | ICD-10-CM | POA: Insufficient documentation

## 2014-01-30 ENCOUNTER — Encounter: Payer: Self-pay | Admitting: *Deleted

## 2014-01-31 ENCOUNTER — Ambulatory Visit (INDEPENDENT_AMBULATORY_CARE_PROVIDER_SITE_OTHER): Payer: Medicare Other | Admitting: Neurology

## 2014-01-31 ENCOUNTER — Encounter: Payer: Self-pay | Admitting: Neurology

## 2014-01-31 VITALS — BP 143/83 | HR 99 | Ht 71.0 in | Wt 174.0 lb

## 2014-01-31 DIAGNOSIS — M5412 Radiculopathy, cervical region: Secondary | ICD-10-CM

## 2014-01-31 DIAGNOSIS — I63412 Cerebral infarction due to embolism of left middle cerebral artery: Secondary | ICD-10-CM

## 2014-01-31 DIAGNOSIS — E785 Hyperlipidemia, unspecified: Secondary | ICD-10-CM

## 2014-01-31 DIAGNOSIS — G5621 Lesion of ulnar nerve, right upper limb: Secondary | ICD-10-CM

## 2014-01-31 NOTE — Patient Instructions (Signed)
-   continue plavix and simvastatin for stroke prevention - Follow up with your primary care physician for stroke risk factor modification. Recommend maintain blood pressure goal <130/80, diabetes with hemoglobin A1c goal below 6.5% and lipids with LDL cholesterol goal below 70 mg/dL.  - introduce RESPECT ESUS to you for consideration - if you and your wife has any questions, please give [place] a call and we are happy to answer. - follow up in 2 months.

## 2014-02-03 DIAGNOSIS — M5412 Radiculopathy, cervical region: Secondary | ICD-10-CM | POA: Insufficient documentation

## 2014-02-03 DIAGNOSIS — E785 Hyperlipidemia, unspecified: Secondary | ICD-10-CM | POA: Insufficient documentation

## 2014-02-03 DIAGNOSIS — I63412 Cerebral infarction due to embolism of left middle cerebral artery: Secondary | ICD-10-CM | POA: Insufficient documentation

## 2014-02-03 DIAGNOSIS — G5621 Lesion of ulnar nerve, right upper limb: Secondary | ICD-10-CM | POA: Insufficient documentation

## 2014-02-03 HISTORY — DX: Cerebral infarction due to embolism of left middle cerebral artery: I63.412

## 2014-02-03 NOTE — Progress Notes (Signed)
NEUROLOGY CLINIC NEW PATIENT NOTE  NAME: [name] DOB: 1931/10/28 REFERRING PHYSICIAN: Dorian Heckle, MD  I saw [name] as a new consult in the neurovascular clinic today regarding his recent stroke. He was accompanied by no one.  HPI: [name] is a 80 y.o. male with PMH of DM and HLD who presents as a new patient for hosptial follow up of his recent stroke.   Pt stated that he had his first episode about 5 years ago when he was at the breakfast table and suddenly not able to communicate, difficult to get words out, with slowing thinking and processing in mind, no weakness or vision changes, lasting 2 hours and then resolved. He had work up with MRI was told negative and had cardiac monitoring for 2 days also negative. He was on ASA at that time and he was changed to plavix.   About 2 weeks before the recent admission, he had another episode of difficulty getting words out, no weakness or numbness and lasting 20-21min resolved. On the day of admission on 12/22/13, he had similar episode again with word finding difficulty and slow mental processing, episode again lasted about 2 hours. He takes his plavix daily and not missing doses. His MRI showed left temporal cortical stroke, emboli pattern. Other stroke work up unrevealing. He was discharged with plavix and followed up with his cardiologist Dr. Tollie Eth, had 30 day cardiac monitoring and was told no afib.   He stated that for the last 4 years, he had right hand intermittent numbness especially in the morning after waking up, during the day the numbness would go away. He followed up with Dr. Posey Pronto at [facility], had EMG/NCS done showed right ulnar neuropathy, and bilateral cervical radiculopathy. He has DM and HLD but his A1C was 6.7 and LDL 76 during admission. His BP today 143/83 and he stated that at home his BP 120s/70s.  He denies smoking, alcohol or illicit drugs.   Past Medical History  Diagnosis Date  .  Diverticulosis   . Hiatal hernia   . Colitis   . Hemorrhoids   . Colon polyps   . Proctitis   . GERD (gastroesophageal reflux disease)   . Osteoarthritis   . Asthma   . COPD (chronic obstructive pulmonary disease)   . Status post dilation of esophageal narrowing   . HLD (hyperlipidemia)   . Stroke   . Diabetes mellitus type 2, diet-controlled 12/22/2013  . History of esophageal strciture   . Dyslipidemia 06/24/2007   Past Surgical History  Procedure Laterality Date  . Nissen    . Splenectomy, total    . Esophageal hernia    . Abdominal hernia repair    . Ventral hernia repair  01/29/2012     WITH MESH  . Ventral hernia repair  01/29/2012    Procedure: HERNIA REPAIR VENTRAL ADULT;  Surgeon: Gwenyth Ober, MD;  Location: [facility];  Service: General;  Laterality: N/A;  open recurrent ventral hernia repair with mesh  . Insertion of mesh  01/29/2012    Procedure: INSERTION OF MESH;  Surgeon: Gwenyth Ober, MD;  Location: [facility];  Service: General;  Laterality: N/A;   Family History  Problem Relation Age of Onset  . Cancer Father    Current Outpatient Prescriptions  Medication Sig Dispense Refill  . ciprofloxacin (CIPRO) 500 MG tablet Take 500 mg by mouth daily. Started on 11/4, finish on 11/7    . clopidogrel (PLAVIX)  75 MG tablet Take 75 mg by mouth daily.    . fluticasone-salmeterol (ADVAIR HFA) 115-21 MCG/ACT inhaler Inhale 2 puffs into the lungs 2 (two) times daily.    . Magnesium Gluconate 500 (27 MG) MG TABS Take 500 mg by mouth at bedtime.    . Multiple Vitamin (MULTIVITAMIN WITH MINERALS) TABS Take 1 tablet by mouth daily.    . simvastatin (ZOCOR) 40 MG tablet Take 1 tablet (40 mg total) by mouth every other day. 30 tablet 0  . zolpidem (AMBIEN) 5 MG tablet Take 1 tablet (5 mg total) by mouth at bedtime as needed for sleep. 20 tablet 0   No current facility-administered medications for this visit.   No Known Allergies History   Social History  . Marital Status: Married      Spouse Name: N/A    Number of Children: 2  . Years of Education: [employer]   Occupational History  . retired    Social History Main Topics  . Smoking status: Never Smoker   . Smokeless tobacco: Never Used  . Alcohol Use: 0.0 oz/week    0 Not specified per week     Comment: 1 1/2 - 2 daily  . Drug Use: No  . Sexual Activity: Not on file   Other Topics Concern  . Not on file   Social History Narrative   Patient is married and has 2 children.   Patient is right handed.   Patient has Bachelor's degree.   Patient drinks 2 cups daily.    Review of Systems Full 14 system review of systems performed and notable only for those listed, all others are neg:  Constitutional: N/A  Cardiovascular: N/A  Ear/Nose/Throat: N/A  Skin: N/A  Eyes: N/A  Respiratory: cough  Gastroitestinal: N/A  Hematology/Lymphatic: N/A  Endocrine: N/A  Musculoskeletal: joint pain  Allergy/Immunology: N/A  Neurological: N/A  Psychiatric: N/A   Physical Exam  Filed Vitals:   01/31/14 0823  BP: 143/83  Pulse: 99    General - Well nourished, well developed, in no apparent distress.  Ophthalmologic - Sharp disc margins OU.  Cardiovascular - Regular rate and rhythm with no murmur. Carotid pulses were 2+ without bruits .   Neck - supple, no nuchal rigidity .  Mental Status -  Level of arousal and orientation to time, place, and person were intact. Language including expression, naming, repetition, comprehension was assessed and found intact.  Cranial Nerves II - XII - II - Visual field intact OU. III, IV, VI - Extraocular movements intact. V - Facial sensation intact bilaterally. VII - Facial movement intact bilaterally. VIII - Hearing & vestibular intact bilaterally. X - Palate elevates symmetrically. XI - Chin turning & shoulder shrug intact bilaterally. XII - Tongue protrusion intact.  Motor Strength - The patient's strength was normal in all extremities and pronator drift was  absent.  Bulk was normal and fasciculations were absent.   Motor Tone - Muscle tone was assessed at the neck and appendages and was normal.  Reflexes - The patient's reflexes were normal in all extremities and he had no pathological reflexes.  Sensory - Light touch, temperature/pinprick were assessed and were normal.    Coordination - The patient had normal movements in the hands and feet with no ataxia or dysmetria.  Tremor was absent.  Gait and Station - slow but steady gait.  Imaging EMG 06/19/13 -  1. Right ulnar neuropathy across the elbow, demyelinating and axonal loss in type. Overall, these findings are  mild in degree electrically. 2. Right cervical radiculopathy affecting the C7-C8 nerve root/segments, mild in degree electrically 3. Left cervical radiculopathy affecting the C8 nerve root/segment, mild in degree electrically. 4. There is no definite evidence of carpal tunnel syndrome affecting the upper extremities.  Ct Head Wo Contrast 12/22/2013  Progression of atrophy and chronic microvascular ischemia. No acute abnormality. Sinusitis with air-fluid levels   Mri Brain Without Contrast 12/22/2013  MRI HEAD  1. 1.8 cm linear ischemic infarct within the posterior left frontotemporal region without significant mass effect or hemorrhage.  2. Atrophy with moderate chronic small vessel ischemic disease.  3. Small remote lacunar infarcts within the right basal ganglia.   MRA HEAD  No proximal branch occlusion or hemodynamically significant stenosis identified within the intracranial circulation.  2D echo - - Left ventricle: The cavity size was normal. Wall thickness was increased in a pattern of mild LVH. There was mild focal basal hypertrophy of the septum. The estimated ejection fraction was 55%. Doppler parameters are consistent with abnormal left ventricular relaxation (grade 1 diastolic dysfunction). - Left atrium: The atrium was mildly dilated. -  Atrial septum: No defect or patent foramen ovale was identified.  CUS - Bilateral: 1-39% ICA stenosis. Vertebral artery flow is antegrade.  Lab Review A1C 6.7 and LDL 76    Assessment and Plan:   In summary, [name] is a 80 y.o. male with PMH of DM and HLD presents episodes of difficulty getting words out. The first episode was about 5 years ago and then for the last 3 weeks, he had two more similar episodes. MRI showed left temporal cortical stroke, consistent with emboli stroke. His stroke work up so far unrevealing including TTE, CUS, MRA, 30 day cardiac monitoring (done by Dr. Wynonia Lawman, I have not have any result available to review). He apparently failed ASA and plavix at this point. However, we do not have indication for anticoagulation. For the treatment dilema, we will introduce RESPECT ESUS to him.    - continue plavix and zocor for now for stroke prevention - Follow up with your primary care physician for stroke risk factor modification. Recommend maintain blood pressure goal <130/80, diabetes with hemoglobin A1c goal below 6.5% and lipids with LDL cholesterol goal below 70 mg/dL.  - consider RESPECT ESUS trial - check BP at home - RTC in 2 months  I recommend aggressive blood pressure control with a goal <130/80 mm Hg.  Lipids should be managed intensively, with a goal LDL < 70 mg/dL.  I encouraged the patient to discuss these important issues with his primary care physician.  I counseled the patient on measures to reduce stroke risk, including the importance of medication compliance, risk factor control, exercise, healthy diet, and avoidance of smoking.  I reviewed stroke warning signs and symptoms and appropriate actions to take if such occurs.   Thank you very much for the opportunity to participate in the care of this patient.  Please do not hesitate to call if any questions or concerns arise.  No orders of the defined types were placed in this encounter.    No orders of  the defined types were placed in this encounter.    Patient Instructions  - continue plavix and simvastatin for stroke prevention - Follow up with your primary care physician for stroke risk factor modification. Recommend maintain blood pressure goal <130/80, diabetes with hemoglobin A1c goal below 6.5% and lipids with LDL cholesterol goal below 70 mg/dL.  - introduce RESPECT ESUS  to you for consideration - if you and your wife has any questions, please give [place] a call and we are happy to answer. - follow up in 2 months.    [name], MD PhD [facility] [address] [place], [place] [address] [phone]

## 2014-04-25 ENCOUNTER — Encounter: Payer: Self-pay | Admitting: Neurology

## 2014-04-25 ENCOUNTER — Ambulatory Visit (INDEPENDENT_AMBULATORY_CARE_PROVIDER_SITE_OTHER): Payer: Medicare Other | Admitting: Neurology

## 2014-04-25 VITALS — BP 129/82 | HR 77 | Temp 97.0°F | Ht 71.0 in | Wt 169.0 lb

## 2014-04-25 DIAGNOSIS — E785 Hyperlipidemia, unspecified: Secondary | ICD-10-CM

## 2014-04-25 DIAGNOSIS — E119 Type 2 diabetes mellitus without complications: Secondary | ICD-10-CM | POA: Insufficient documentation

## 2014-04-25 DIAGNOSIS — I63412 Cerebral infarction due to embolism of left middle cerebral artery: Secondary | ICD-10-CM

## 2014-04-25 NOTE — Progress Notes (Signed)
STROKE NEUROLOGY FOLLOW UP NOTE  NAME: [name] DOB: 11-May-1931  REASON FOR VISIT: stroke follow up HISTORY FROM: pt and chart  Today we had the pleasure of seeing [name] in follow-up at our Neurology Clinic. Pt was accompanied by no one.   History Summary [name] is a 81 y.o. male with PMH of DM and HLD who was seen on 02/03/14 as a new patient for hosptial follow up of his stroke.  He was admitted on 12/22/13 for difficulty getting words out. He had similar episode about 5 years ago and then for the last 3 weeks prior to admission, he had two more episodes. MRI showed left temporal cortical stroke, consistent with emboli stroke. His stroke work up unrevealing including TTE, CUS, MRA, 30 day cardiac monitoring (done by Dr. Wynonia Lawman, I have not have any result available to review in chart). As per pt, Dr. Wynonia Lawman told him that no afib was detected during the monitoring. He was taking plavix prior to admission. Before plavix, he was taking ASA. He was continued on plavix and zocor, but was introduced to RESPECT ESUS trial.  Interval History During the interval time, the patient has been doing well. No recurrent stroke like symptoms. He is on plavix and zocor. His BP in good control today 129/82 in clinic. He is interested in the research trial and tried to contact us but not successful.   REVIEW OF SYSTEMS: Full 14 system review of systems performed and notable only for those listed below and in HPI above, all others are negative:  Constitutional:   Cardiovascular:  Ear/Nose/Throat:  Runny nose Skin:  Eyes:   Respiratory:  cough Gastroitestinal:   Genitourinary:  Hematology/Lymphatic:   Endocrine:  Musculoskeletal:   Allergy/Immunology:   Neurological:   Psychiatric:  Sleep:   The following represents the patient's updated allergies and side effects list: No Known Allergies  The neurologically relevant items on the patient's problem list were reviewed on today's  visit.  Neurologic Examination  A problem focused neurological exam (12 or more points of the single system neurologic examination, vital signs counts as 1 point, cranial nerves count for 8 points) was performed.  Blood pressure 129/82, pulse 77, temperature 97 F (36.1 C), temperature source Oral, height 5\' 11"  (1.803 m), weight 169 lb (76.658 kg).  General - Well nourished, well developed, in no apparent distress.  Ophthalmologic - Sharp disc margins OU.  Cardiovascular - Regular rate and rhythm with no murmur.  Mental Status -  Level of arousal and orientation to time, place, and person were intact. Language including expression, naming, repetition, comprehension, reading, and writing was assessed and found intact. Attention span and concentration were normal. Recent and remote memory were intact. Fund of Knowledge was assessed and was intact.  Cranial Nerves II - XII - II - Visual field intact OU. III, IV, VI - Extraocular movements intact. V - Facial sensation intact bilaterally. VII - Facial movement intact bilaterally. VIII - Hearing & vestibular intact bilaterally. X - Palate elevates symmetrically. XI - Chin turning & shoulder shrug intact bilaterally. XII - Tongue protrusion intact.  Motor Strength - The patient's strength was normal in all extremities and pronator drift was absent.  Bulk was normal and fasciculations were absent.   Motor Tone - Muscle tone was assessed at the neck and appendages and was normal.  Reflexes - The patient's reflexes were symmetrical in all extremities and he had no pathological reflexes.  Sensory - Light  touch, temperature/pinprick were assessed and were normal.    Coordination - The patient had normal movements in the hands and feet with no ataxia or dysmetria.  Tremor was absent.  Gait and Station - mildly slow, but steady.  Data reviewed: I personally reviewed the images and agree with the radiology interpretations.  Ct Head Wo  Contrast 12/22/2013  Progression of atrophy and chronic microvascular ischemia. No acute abnormality. Sinusitis with air-fluid levels   Mri Brain Without Contrast 12/22/2013  MRI HEAD  1. 1.8 cm linear ischemic infarct within the posterior left frontotemporal region without significant mass effect or hemorrhage.  2. Atrophy with moderate chronic small vessel ischemic disease.  3. Small remote lacunar infarcts within the right basal ganglia.   MRA HEAD  No proximal branch occlusion or hemodynamically significant stenosis identified within the intracranial circulation.  2D echo - - Left ventricle: The cavity size was normal. Wall thickness was increased in a pattern of mild LVH. There was mild focal basal hypertrophy of the septum. The estimated ejection fraction was 55%. Doppler parameters are consistent with abnormal left ventricular relaxation (grade 1 diastolic dysfunction). - Left atrium: The atrium was mildly dilated. - Atrial septum: No defect or patent foramen ovale was identified.  CUS - Bilateral: 1-39% ICA stenosis. Vertebral artery flow is antegrade.  Component     Latest Ref Rng 12/22/2013 12/23/2013  Cholesterol     0 - 200 mg/dL  152  Triglycerides     <150 mg/dL  168 (H)  HDL     >39 mg/dL  42  Total CHOL/HDL Ratio       3.6  VLDL     0 - 40 mg/dL  34  LDL (calc)     0 - 99 mg/dL  76  Hemoglobin A1C     <5.7 % 6.7 (H)   Mean Plasma Glucose     <117 mg/dL 146 (H)     Assessment: As you may recall, he is a 81 y.o. Caucasian male with PMH of DM and HLD was admitted on 12/22/13 for episode of difficulty getting words out. He apparently had similar episodes 5 years ago and for the last 3 weeks prior to admission. MRI showed left temporal cortical stroke, consistent with emboli stroke. His stroke work up so far unrevealing including TTE, CUS, MRA, 30 day cardiac monitoring (done by Dr. Wynonia Lawman, no afib as per pt but no result in chart). His stroke  risk factor also in good control. He was continued on his plavix and zocor. However, for the treatment dilema, he was introduced with RESPECT ESUS and he is interested in that. We will proceed.  Plan:  - continue plavix for now until randomization to start with investigational meds - continue zocor for stroke prevention - Follow up with your primary care physician for stroke risk factor modification. Recommend maintain blood pressure goal <130/80, diabetes with hemoglobin A1c goal below 6.5% and lipids with LDL cholesterol goal below 70 mg/dL.  - proceed with RESPECT ESUS screening and randomization.  - check BP at home - follow up with me in research follow up visits.  No orders of the defined types were placed in this encounter.    Meds ordered this encounter  Medications  . simvastatin (ZOCOR) 20 MG tablet    Sig: Take 20 mg by mouth daily.    Patient Instructions  - continue plavix for now until we start you on the research study - thank you very much for participating  on our study - continue zocor for stroke prevention - Follow up with your primary care physician for stroke risk factor modification. Recommend maintain blood pressure goal <130/80, diabetes with hemoglobin A1c goal below 6.5% and lipids with LDL cholesterol goal below 70 mg/dL.  - check BP at home - follow up in research visits.    [name], MD PhD [facility] [address] [place], [place] [address] [phone]

## 2014-04-25 NOTE — Patient Instructions (Addendum)
-   continue plavix for now until we start you on the research study - thank you very much for participating on our study - continue zocor for stroke prevention - Follow up with your primary care physician for stroke risk factor modification. Recommend maintain blood pressure goal <130/80, diabetes with hemoglobin A1c goal below 6.5% and lipids with LDL cholesterol goal below 70 mg/dL.  - check BP at home - follow up in research visits.

## 2014-05-01 ENCOUNTER — Telehealth: Payer: Self-pay | Admitting: Neurology

## 2014-05-01 ENCOUNTER — Ambulatory Visit
Admission: RE | Admit: 2014-05-01 | Discharge: 2014-05-01 | Disposition: A | Discharge status: Home / Self Care | Payer: Medicare Other | Source: Ambulatory Visit | Attending: Internal Medicine | Admitting: Internal Medicine

## 2014-05-01 ENCOUNTER — Other Ambulatory Visit: Payer: Self-pay | Admitting: Internal Medicine

## 2014-05-01 DIAGNOSIS — R059 Cough, unspecified: Secondary | ICD-10-CM

## 2014-05-01 DIAGNOSIS — R05 Cough: Secondary | ICD-10-CM

## 2014-05-01 NOTE — Telephone Encounter (Signed)
I spoke to the patient about the screening visit for the RESPECT-ESUS research trial. An appointment was scheduled for Wednesday, [id number] at 11:00h. Patient stated that he has had a cardiac monitor before and that he was taking prednisone 40 mg q.d.

## 2014-05-01 NOTE — Telephone Encounter (Signed)
I spoke to the patient, but the patient requested to be called back later today.

## 2014-05-02 ENCOUNTER — Encounter (INDEPENDENT_AMBULATORY_CARE_PROVIDER_SITE_OTHER): Payer: Self-pay | Admitting: Neurology

## 2014-05-02 DIAGNOSIS — Z0289 Encounter for other administrative examinations: Secondary | ICD-10-CM

## 2014-05-07 ENCOUNTER — Telehealth: Payer: Self-pay | Admitting: Neurology

## 2014-05-07 NOTE — Telephone Encounter (Signed)
I left a message for the patient to return my call.

## 2014-05-08 ENCOUNTER — Encounter: Payer: Self-pay | Admitting: Neurology

## 2014-05-08 ENCOUNTER — Ambulatory Visit (INDEPENDENT_AMBULATORY_CARE_PROVIDER_SITE_OTHER): Payer: Self-pay | Admitting: Neurology

## 2014-05-08 DIAGNOSIS — I63412 Cerebral infarction due to embolism of left middle cerebral artery: Secondary | ICD-10-CM

## 2014-05-08 NOTE — Patient Instructions (Signed)
Thank you for your participation in this trial.

## 2014-05-08 NOTE — Progress Notes (Signed)
Patient came in for RESPECT ESUS trial enrollment and randomization. We went through inclusion and exclusion criteria, and all his questions were answered. He'll be randomized today and started taking medications tonight. Plavix discontinued.  [name], MD PhD Stroke Neurology 05/08/2014 2:43 PM

## 2014-06-28 ENCOUNTER — Telehealth: Payer: Self-pay | Admitting: Neurology

## 2014-06-28 ENCOUNTER — Emergency Department (HOSPITAL_COMMUNITY): Payer: Medicare Other

## 2014-06-28 ENCOUNTER — Emergency Department (HOSPITAL_COMMUNITY)
Admission: EM | Admit: 2014-06-28 | Discharge: 2014-06-28 | Disposition: A | Discharge status: Home / Self Care | Payer: Medicare Other | Attending: Emergency Medicine | Admitting: Emergency Medicine

## 2014-06-28 ENCOUNTER — Encounter (HOSPITAL_COMMUNITY): Payer: Self-pay

## 2014-06-28 DIAGNOSIS — E119 Type 2 diabetes mellitus without complications: Secondary | ICD-10-CM | POA: Insufficient documentation

## 2014-06-28 DIAGNOSIS — E785 Hyperlipidemia, unspecified: Secondary | ICD-10-CM | POA: Insufficient documentation

## 2014-06-28 DIAGNOSIS — G451 Carotid artery syndrome (hemispheric): Secondary | ICD-10-CM | POA: Diagnosis not present

## 2014-06-28 DIAGNOSIS — R4701 Aphasia: Secondary | ICD-10-CM | POA: Diagnosis not present

## 2014-06-28 DIAGNOSIS — J449 Chronic obstructive pulmonary disease, unspecified: Secondary | ICD-10-CM | POA: Diagnosis not present

## 2014-06-28 DIAGNOSIS — Z8739 Personal history of other diseases of the musculoskeletal system and connective tissue: Secondary | ICD-10-CM | POA: Insufficient documentation

## 2014-06-28 DIAGNOSIS — G459 Transient cerebral ischemic attack, unspecified: Secondary | ICD-10-CM | POA: Diagnosis present

## 2014-06-28 DIAGNOSIS — Z8719 Personal history of other diseases of the digestive system: Secondary | ICD-10-CM | POA: Diagnosis not present

## 2014-06-28 DIAGNOSIS — Z79899 Other long term (current) drug therapy: Secondary | ICD-10-CM | POA: Diagnosis not present

## 2014-06-28 DIAGNOSIS — Z7951 Long term (current) use of inhaled steroids: Secondary | ICD-10-CM | POA: Insufficient documentation

## 2014-06-28 DIAGNOSIS — Z86010 Personal history of colonic polyps: Secondary | ICD-10-CM | POA: Insufficient documentation

## 2014-06-28 LAB — CBG MONITORING, ED: Glucose-Capillary: 145 mg/dL — ABNORMAL HIGH (ref 65–99)

## 2014-06-28 LAB — DIFFERENTIAL
BASOS ABS: 0.1 10*3/uL (ref 0.0–0.1)
BASOS PCT: 1 % (ref 0–1)
EOS ABS: 0.8 10*3/uL — AB (ref 0.0–0.7)
Eosinophils Relative: 6 % — ABNORMAL HIGH (ref 0–5)
Lymphocytes Relative: 34 % (ref 12–46)
Lymphs Abs: 4.7 10*3/uL — ABNORMAL HIGH (ref 0.7–4.0)
MONO ABS: 1.6 10*3/uL — AB (ref 0.1–1.0)
MONOS PCT: 12 % (ref 3–12)
Neutro Abs: 6.4 10*3/uL (ref 1.7–7.7)
Neutrophils Relative %: 47 % (ref 43–77)

## 2014-06-28 LAB — I-STAT TROPONIN, ED: TROPONIN I, POC: 0 ng/mL (ref 0.00–0.08)

## 2014-06-28 LAB — COMPREHENSIVE METABOLIC PANEL
ALK PHOS: 81 U/L (ref 38–126)
ALT: 14 U/L — AB (ref 17–63)
AST: 16 U/L (ref 15–41)
Albumin: 3.8 g/dL (ref 3.5–5.0)
Anion gap: 12 (ref 5–15)
BILIRUBIN TOTAL: 0.4 mg/dL (ref 0.3–1.2)
BUN: 13 mg/dL (ref 6–20)
CALCIUM: 9.2 mg/dL (ref 8.9–10.3)
CO2: 24 mmol/L (ref 22–32)
Chloride: 100 mmol/L — ABNORMAL LOW (ref 101–111)
Creatinine, Ser: 0.92 mg/dL (ref 0.61–1.24)
GFR calc non Af Amer: >60 mL/min (ref >60)
GLUCOSE: 141 mg/dL — AB (ref 65–99)
POTASSIUM: 3.9 mmol/L (ref 3.5–5.1)
Sodium: 136 mmol/L (ref 135–145)
Total Protein: 7 g/dL (ref 6.5–8.1)

## 2014-06-28 LAB — CBC
HEMATOCRIT: 51.3 % (ref 39.0–52.0)
HEMOGLOBIN: 17.4 g/dL — AB (ref 13.0–17.0)
MCH: 31.7 pg (ref 26.0–34.0)
MCHC: 33.9 g/dL (ref 30.0–36.0)
MCV: 93.4 fL (ref 78.0–100.0)
PLATELETS: 372 10*3/uL (ref 150–400)
RBC: 5.49 MIL/uL (ref 4.22–5.81)
RDW: 14.5 % (ref 11.5–15.5)
WBC: 13.5 10*3/uL — ABNORMAL HIGH (ref 4.0–10.5)

## 2014-06-28 LAB — PROTIME-INR
INR: 0.9 (ref 0.00–1.49)
Prothrombin Time: 12.2 seconds (ref 11.6–15.2)

## 2014-06-28 LAB — I-STAT CHEM 8, ED
BUN: 17 mg/dL (ref 6–20)
CALCIUM ION: 1.16 mmol/L (ref 1.13–1.30)
CHLORIDE: 100 mmol/L — AB (ref 101–111)
CREATININE: 0.8 mg/dL (ref 0.61–1.24)
Glucose, Bld: 140 mg/dL — ABNORMAL HIGH (ref 65–99)
HCT: 57 % — ABNORMAL HIGH (ref 39.0–52.0)
Hemoglobin: 19.4 g/dL — ABNORMAL HIGH (ref 13.0–17.0)
POTASSIUM: 3.9 mmol/L (ref 3.5–5.1)
Sodium: 139 mmol/L (ref 135–145)
TCO2: 24 mmol/L (ref 0–100)

## 2014-06-28 LAB — APTT: aPTT: 26 seconds (ref 24–37)
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
## 2014-06-28 NOTE — ED Notes (Signed)
Pt transported to radiology.
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
## 2014-06-28 NOTE — Discharge Instructions (Signed)
Continue your current medications. See tour neurologist in one-2 weeks. At that time, you can discuss whether to continue on the experimental medication.  Transient Ischemic Attack A transient ischemic attack (TIA) is a "warning stroke" that causes stroke-like symptoms. Unlike a stroke, a TIA does not cause permanent damage to the brain. The symptoms of a TIA can happen very fast and do not last long. It is important to know the symptoms of a TIA and what to do. This can help prevent a major stroke or death. CAUSES   A TIA is caused by a temporary blockage in an artery in the brain or neck (carotid artery). The blockage does not allow the brain to get the blood supply it needs and can cause different symptoms. The blockage can be caused by either:  A blood clot.  Fatty buildup (plaque) in a neck or brain artery. RISK FACTORS  High blood pressure (hypertension).  High cholesterol.  Diabetes mellitus.  Heart disease.  The build up of plaque in the blood vessels (peripheral artery disease or atherosclerosis).  The build up of plaque in the blood vessels providing blood and oxygen to the brain (carotid artery stenosis).  An abnormal heart rhythm (atrial fibrillation).  Obesity.  Smoking.  Taking oral contraceptives (especially in combination with smoking).  Physical inactivity.  A diet high in fats, salt (sodium), and calories.  Alcohol use.  Use of illegal drugs (especially cocaine and methamphetamine).  Being male.  Being African American.  Being over the age of 78.  Family history of stroke.  Previous history of blood clots, stroke, TIA, or heart attack.  Sickle cell disease. SYMPTOMS  TIA symptoms are the same as a stroke but are temporary. These symptoms usually develop suddenly, or may be newly present upon awakening from sleep:  Sudden weakness or numbness of the face, arm, or leg, especially on one side of the body.  Sudden trouble walking or difficulty  moving arms or legs.  Sudden confusion.  Sudden personality changes.  Trouble speaking (aphasia) or understanding.  Difficulty swallowing.  Sudden trouble seeing in one or both eyes.  Double vision.  Dizziness.  Loss of balance or coordination.  Sudden severe headache with no known cause.  Trouble reading or writing.  Loss of bowel or bladder control.  Loss of consciousness. DIAGNOSIS  Your caregiver may be able to determine the presence or absence of a TIA based on your symptoms, history, and physical exam. Computed tomography (CT scan) of the brain is usually performed to help identify a TIA. Other tests may be done to diagnose a TIA. These tests may include:  Electrocardiography.  Continuous heart monitoring.  Echocardiography.  Carotid ultrasonography.  Magnetic resonance imaging (MRI).  A scan of the brain circulation.  Blood tests. PREVENTION  The risk of a TIA can be decreased by appropriately treating high blood pressure, high cholesterol, diabetes, heart disease, and obesity and by quitting smoking, limiting alcohol, and staying physically active. TREATMENT  Time is of the essence. Since the symptoms of TIA are the same as a stroke, it is important to seek treatment as soon as possible because you may need a medicine to dissolve the clot (thrombolytic) that cannot be given if too much time has passed. Treatment options vary. Treatment options may include rest, oxygen, intravenous (IV) fluids, and medicines to thin the blood (anticoagulants). Medicines and diet may be used to address diabetes, high blood pressure, and other risk factors. Measures will be taken to prevent short-term and  long-term complications, including infection from breathing foreign material into the lungs (aspiration pneumonia), blood clots in the legs, and falls. Treatment options include procedures to either remove plaque in the carotid arteries or dilate carotid arteries that have narrowed  due to plaque. Those procedures are:  Carotid endarterectomy.  Carotid angioplasty and stenting. HOME CARE INSTRUCTIONS   Take all medicines prescribed by your caregiver. Follow the directions carefully. Medicines may be used to control risk factors for a stroke. Be sure you understand all your medicine instructions.  You may be told to take aspirin or the anticoagulant warfarin. Warfarin needs to be taken exactly as instructed.  Taking too much or too little warfarin is dangerous. Too much warfarin increases the risk of bleeding. Too little warfarin continues to allow the risk for blood clots. While taking warfarin, you will need to have regular blood tests to measure your blood clotting time. A PT blood test measures how long it takes for blood to clot. Your PT is used to calculate another value called an INR. Your PT and INR help your caregiver to adjust your dose of warfarin. The dose can change for many reasons. It is critically important that you take warfarin exactly as prescribed.  Many foods, especially foods high in vitamin K can interfere with warfarin and affect the PT and INR. Foods high in vitamin K include spinach, kale, broccoli, cabbage, collard and turnip greens, brussels sprouts, peas, cauliflower, seaweed, and parsley as well as beef and pork liver, green tea, and soybean oil. You should eat a consistent amount of foods high in vitamin K. Avoid major changes in your diet, or notify your caregiver before changing your diet. Arrange a visit with a dietitian to answer your questions.  Many medicines can interfere with warfarin and affect the PT and INR. You must tell your caregiver about any and all medicines you take, this includes all vitamins and supplements. Be especially cautious with aspirin and anti-inflammatory medicines. Do not take or discontinue any prescribed or over-the-counter medicine except on the advice of your caregiver or pharmacist.  Warfarin can have side  effects, such as excessive bruising or bleeding. You will need to hold pressure over cuts for longer than usual. Your caregiver or pharmacist will discuss other potential side effects.  Avoid sports or activities that may cause injury or bleeding.  Be mindful when shaving, flossing your teeth, or handling sharp objects.  Alcohol can change the body's ability to handle warfarin. It is best to avoid alcoholic drinks or consume only very small amounts while taking warfarin. Notify your caregiver if you change your alcohol intake.  Notify your dentist or other caregivers before procedures.  Eat a diet that includes 5 or more servings of fruits and vegetables each day. This may reduce the risk of stroke. Certain diets may be prescribed to address high blood pressure, high cholesterol, diabetes, or obesity.  A low-sodium, low-saturated fat, low-trans fat, low-cholesterol diet is recommended to manage high blood pressure.  A low-saturated fat, low-trans fat, low-cholesterol, and high-fiber diet may control cholesterol levels.  A controlled-carbohydrate, controlled-sugar diet is recommended to manage diabetes.  A reduced-calorie, low-sodium, low-saturated fat, low-trans fat, low-cholesterol diet is recommended to manage obesity.  Maintain a healthy weight.  Stay physically active. It is recommended that you get at least 30 minutes of activity on most or all days.  Do not smoke.  Limit alcohol use even if you are not taking warfarin. Moderate alcohol use is considered to be:  No more than 2 drinks each day for men.  No more than 1 drink each day for nonpregnant women.  Stop drug abuse.  Home safety. A safe home environment is important to reduce the risk of falls. Your caregiver may arrange for specialists to evaluate your home. Having grab bars in the bedroom and bathroom is often important. Your caregiver may arrange for equipment to be used at home, such as raised toilets and a seat for  the shower.  Follow all instructions for follow-up with your caregiver. This is very important. This includes any referrals and lab tests. Proper follow up can prevent a stroke or another TIA from occurring. SEEK MEDICAL CARE IF:  You have personality changes.  You have difficulty swallowing.  You are seeing double.  You have dizziness.  You have a fever.  You have skin breakdown. SEEK IMMEDIATE MEDICAL CARE IF:  Any of these symptoms may represent a serious problem that is an emergency. Do not wait to see if the symptoms will go away. Get medical help right away. Call your local emergency services (911 in [place] Do not drive yourself to the hospital.  You have sudden weakness or numbness of the face, arm, or leg, especially on one side of the body.  You have sudden trouble walking or difficulty moving arms or legs.  You have sudden confusion.  You have trouble speaking (aphasia) or understanding.  You have sudden trouble seeing in one or both eyes.  You have a loss of balance or coordination.  You have a sudden, severe headache with no known cause.  You have new chest pain or an irregular heartbeat.  You have a partial or total loss of consciousness. MAKE SURE YOU:   Understand these instructions.  Will watch your condition.  Will get help right away if you are not doing well or get worse. Document Released: 11/12/2004 Document Revised: 02/07/2013 Document Reviewed: 05/10/2013 [facility] Patient Information 2015 [place], [place]. This information is not intended to replace advice given to you by your health care provider. Make sure you discuss any questions you have with your health care provider.

## 2014-06-28 NOTE — ED Provider Notes (Signed)
CSN: [id number]     Arrival date & time 06/28/14  1849 History   First MD Initiated Contact with Patient 06/28/14 1936     Chief Complaint  Patient presents with  . Transient Ischemic Attack     (Consider location/radiation/quality/duration/timing/severity/associated sxs/prior Treatment) The history is provided by the patient and the spouse.   81 year old male with history of prior TIA was driving when his wife noted that his speech was slurred and he noticed that there was numbness of his right hand. He moved to the passenger side and his wife drove. Symptoms resolved after about 30 minutes. He denies any headache, chest pain, nausea. He is currently taking investigational medication related to his prior TIA.  Past Medical History  Diagnosis Date  . Diverticulosis   . Hiatal hernia   . Colitis   . Hemorrhoids   . Colon polyps   . Proctitis   . GERD (gastroesophageal reflux disease)   . Osteoarthritis   . Asthma   . COPD (chronic obstructive pulmonary disease)   . Status post dilation of esophageal narrowing   . HLD (hyperlipidemia)   . Stroke   . Diabetes mellitus type 2, diet-controlled 12/22/2013  . History of esophageal strciture   . Dyslipidemia 06/24/2007   Past Surgical History  Procedure Laterality Date  . Nissen    . Splenectomy, total    . Esophageal hernia    . Abdominal hernia repair    . Ventral hernia repair  01/29/2012     WITH MESH  . Ventral hernia repair  01/29/2012    Procedure: HERNIA REPAIR VENTRAL ADULT;  Surgeon: Gwenyth Ober, MD;  Location: [facility];  Service: General;  Laterality: N/A;  open recurrent ventral hernia repair with mesh  . Insertion of mesh  01/29/2012    Procedure: INSERTION OF MESH;  Surgeon: Gwenyth Ober, MD;  Location: [facility];  Service: General;  Laterality: N/A;   Family History  Problem Relation Age of Onset  . Cancer Father    History  Substance Use Topics  . Smoking status: Never Smoker   . Smokeless tobacco: Never Used  .  Alcohol Use: 0.0 oz/week    0 Standard drinks or equivalent per week     Comment: 1 1/2 - 2 daily    Review of Systems  All other systems reviewed and are negative.     Allergies  Review of patient's allergies indicates no known allergies.  Home Medications   Prior to Admission medications   Medication Sig Start Date End Date Taking? Authorizing Provider  amoxicillin-clavulanate (AUGMENTIN) 875-125 MG per tablet Take 1 tablet by mouth 2 (two) times daily. 06/22/14  Yes Historical Provider, MD  fluticasone (FLONASE) 50 MCG/ACT nasal spray Place 2 sprays into both nostrils daily. 06/18/14  Yes Historical Provider, MD  fluticasone-salmeterol (ADVAIR HFA) 115-21 MCG/ACT inhaler Inhale 2 puffs into the lungs 2 (two) times daily.   Yes Historical Provider, MD  Investigational - Study Medication Take 1 tablet by mouth daily. Additional Study Details:   Yes Historical Provider, MD  Magnesium Gluconate 500 (27 MG) MG TABS Take 500 mg by mouth at bedtime.   Yes Historical Provider, MD  montelukast (SINGULAIR) 10 MG tablet Take 10 mg by mouth every evening. 06/18/14  Yes Historical Provider, MD  Multiple Vitamin (MULTIVITAMIN WITH MINERALS) TABS Take 1 tablet by mouth daily.   Yes Historical Provider, MD  predniSONE (STERAPRED UNI-PAK 21 TAB) 10 MG (21) TBPK tablet Take 1 tablet by mouth  See admin instructions. 6 day taper, completed 5/11 06/22/14  Yes Historical Provider, MD  simvastatin (ZOCOR) 20 MG tablet Take 20 mg by mouth daily.   Yes Historical Provider, MD  zolpidem (AMBIEN) 5 MG tablet Take 1 tablet (5 mg total) by mouth at bedtime as needed for sleep. 02/04/12  Yes Judeth Horn, MD   BP 162/68 mmHg  Pulse 85  Temp(Src) 98.5 F (36.9 C) (Oral)  Resp 21  Ht 5\' 10"  (1.778 m)  Wt 172 lb (78.019 kg)  BMI 24.68 kg/m2  SpO2 96% Physical Exam  Nursing note and vitals reviewed.  81 year old male, resting comfortably and in no acute distress. Vital signs are significant for hypertension and  borderline tachypnea. Oxygen saturation is 96%, which is normal. Head is normocephalic and atraumatic. PERRLA, EOMI. Oropharynx is clear. Fundi show no hemorrhage, exudate, or papilledema. Neck is nontender and supple without adenopathy or JVD. There are no carotid bruits. Back is nontender and there is no CVA tenderness. Lungs are clear without rales, wheezes, or rhonchi. Chest is nontender. Heart has regular rate and rhythm without murmur. Abdomen is soft, flat, nontender without masses or hepatosplenomegaly and peristalsis is normoactive. Extremities have no cyanosis or edema, full range of motion is present. Skin is warm and dry without rash. Neurologic: Mental status is normal, cranial nerves are intact, there are no motor or sensory deficits. There is no pronator drift and no extinction on double simultaneous stimulation.  ED Course  Procedures (including critical care time) Labs Review Results for orders placed or performed during the hospital encounter of 06/28/14  Protime-INR  Result Value Ref Range   Prothrombin Time 12.2 11.6 - 15.2 seconds   INR 0.90 0.00 - 1.49  APTT  Result Value Ref Range   aPTT 26 24 - 37 seconds  CBC  Result Value Ref Range   WBC 13.5 (H) 4.0 - 10.5 K/uL   RBC 5.49 4.22 - 5.81 MIL/uL   Hemoglobin 17.4 (H) 13.0 - 17.0 g/dL   HCT 51.3 39.0 - 52.0 %   MCV 93.4 78.0 - 100.0 fL   MCH 31.7 26.0 - 34.0 pg   MCHC 33.9 30.0 - 36.0 g/dL   RDW 14.5 11.5 - 15.5 %   Platelets 372 150 - 400 K/uL  Differential  Result Value Ref Range   Neutrophils Relative % 47 43 - 77 %   Neutro Abs 6.4 1.7 - 7.7 K/uL   Lymphocytes Relative 34 12 - 46 %   Lymphs Abs 4.7 (H) 0.7 - 4.0 K/uL   Monocytes Relative 12 3 - 12 %   Monocytes Absolute 1.6 (H) 0.1 - 1.0 K/uL   Eosinophils Relative 6 (H) 0 - 5 %   Eosinophils Absolute 0.8 (H) 0.0 - 0.7 K/uL   Basophils Relative 1 0 - 1 %   Basophils Absolute 0.1 0.0 - 0.1 K/uL  Comprehensive metabolic panel  Result Value Ref  Range   Sodium 136 135 - 145 mmol/L   Potassium 3.9 3.5 - 5.1 mmol/L   Chloride 100 (L) 101 - 111 mmol/L   CO2 24 22 - 32 mmol/L   Glucose, Bld 141 (H) 65 - 99 mg/dL   BUN 13 6 - 20 mg/dL   Creatinine, Ser 0.92 0.61 - 1.24 mg/dL   Calcium 9.2 8.9 - 10.3 mg/dL   Total Protein 7.0 6.5 - 8.1 g/dL   Albumin 3.8 3.5 - 5.0 g/dL   AST 16 15 - 41 U/L  ALT 14 (L) 17 - 63 U/L   Alkaline Phosphatase 81 38 - 126 U/L   Total Bilirubin 0.4 0.3 - 1.2 mg/dL   GFR calc non Af Amer >60 >60 mL/min   GFR calc Af Amer >60 >60 mL/min   Anion gap 12 5 - 15  I-Stat Chem 8, ED  (not at [facility], [facility])  Result Value Ref Range   Sodium 139 135 - 145 mmol/L   Potassium 3.9 3.5 - 5.1 mmol/L   Chloride 100 (L) 101 - 111 mmol/L   BUN 17 6 - 20 mg/dL   Creatinine, Ser 0.80 0.61 - 1.24 mg/dL   Glucose, Bld 140 (H) 65 - 99 mg/dL   Calcium, Ion 1.16 1.13 - 1.30 mmol/L   TCO2 24 0 - 100 mmol/L   Hemoglobin 19.4 (H) 13.0 - 17.0 g/dL   HCT 57.0 (H) 39.0 - 52.0 %  I-stat troponin, ED (not at [facility], [facility])  Result Value Ref Range   Troponin i, poc 0.00 0.00 - 0.08 ng/mL   Comment 3          CBG monitoring, ED  Result Value Ref Range   Glucose-Capillary 145 (H) 65 - 99 mg/dL    Imaging Review Ct Head (brain) Wo Contrast  06/28/2014   CLINICAL DATA:  TIA, 30 minutes episode of speech difficulty and right hand numbness  EXAM: CT HEAD WITHOUT CONTRAST  TECHNIQUE: Contiguous axial images were obtained from the base of the skull through the vertex without intravenous contrast.  COMPARISON:  12/22/2013  FINDINGS: No skull fracture is noted. Paranasal sinuses and mastoid air cells are unremarkable.  No intracranial hemorrhage, mass effect or midline shift. Stable cerebral atrophy. Stable periventricular and patchy subcortical chronic white matter disease. Small lacunar infarct in right basal ganglia is stable. No definite acute cortical infarction. No mass lesion is noted on this unenhanced scan.  IMPRESSION: No acute  intracranial abnormality. Stable atrophy and chronic white matter disease. No definite acute cortical infarction. Stable tiny lacunar infarct in right basal ganglia.   Electronically Signed   By: Lahoma Crocker M.D.   On: 06/28/2014 19:32   Mr Brain Wo Contrast  06/28/2014   CLINICAL DATA:  81 year old male with acute onset slurred speech and right hand numbness at 1615 hrs today. Initial encounter. Left hemisphere infarct in November.  EXAM: MRI HEAD WITHOUT CONTRAST  TECHNIQUE: Multiplanar, multiecho pulse sequences of the brain and surrounding structures were obtained without intravenous contrast.  COMPARISON:  Head CT without contrast 1934 hr today. Brain MRI and MRA 12/22/2013.  FINDINGS: Major intracranial vascular flow voids are stable. No restricted diffusion or evidence of acute infarction.  Stable cerebral volume. No midline shift, mass effect, evidence of mass lesion, ventriculomegaly, extra-axial collection or acute intracranial hemorrhage. Cervicomedullary junction and pituitary are within normal limits.  Pearline Cables and white matter signal is stable since 2015, with periventricular and other scattered and patchy white matter T2 and FLAIR hyperintensity. Small chronic micro hemorrhage in the medial right thalamus. Moderate T2 heterogeneity in the deep gray matter nuclei otherwise appears primarily due to perivascular spaces. Brainstem and cerebellum within normal limits for age.  Visible internal auditory structures appear normal. Stable trace left mastoid fluid. Small volume fluid and mucosal thickening also in the paranasal sinuses similar to prior. Postoperative changes to the globes. Visualized scalp soft tissues are within normal limits. Negative visualized cervical spine ; degenerative ligamentous hypertrophy about the odontoid. Normal bone marrow signal.  IMPRESSION: No acute intracranial abnormality.  Stable MRI appearance of the brain since 2015, chronic small vessel disease.   Electronically Signed    By: Genevie Ann M.D.   On: 06/28/2014 20:55     EKG Interpretation   Date/Time:  Thursday Jun 28 2014 19:05:18 EDT Ventricular Rate:  83 PR Interval:  178 QRS Duration: 84 QT Interval:  358 QTC Calculation: 420 R Axis:   -47 Text Interpretation:  Normal sinus rhythm Pulmonary disease pattern Left  anterior fascicular block Abnormal ECG When compared with ECG of  12/22/2013, No significant change was found Confirmed by [facility]  MD, Isadore Bokhari  ([id number]) on 06/28/2014 7:34:09 PM      MDM   Final diagnoses:  Aphasia  Hemispheric carotid artery syndrome    Clinical transient ischemic attack involving the left hemisphere. Old records are reviewed and he actually had a small stroke based on MRI scan in November 2015. CT shows no acute process. There is no indication for repeating the TIA workup. MR scan will be obtained to rule out new stroke.  MRI shows no evidence of new stroke. He is currently on an investigational drug for TIAs. He is to continue on that medication until he can see his neurologist in follow-up in one-2 weeks.  Delora Fuel, MD 19/14/78 [id number]

## 2014-06-28 NOTE — ED Notes (Signed)
Pt returned from CT °

## 2014-06-28 NOTE — ED Notes (Signed)
Pt transported to MRI 

## 2014-07-09 ENCOUNTER — Encounter: Payer: Medicare Other | Admitting: Diagnostic Neuroimaging

## 2014-07-20 ENCOUNTER — Encounter: Payer: Self-pay | Admitting: Neurology

## 2014-07-20 ENCOUNTER — Ambulatory Visit (INDEPENDENT_AMBULATORY_CARE_PROVIDER_SITE_OTHER): Payer: Self-pay | Admitting: Neurology

## 2014-07-20 DIAGNOSIS — G451 Carotid artery syndrome (hemispheric): Secondary | ICD-10-CM

## 2014-07-20 DIAGNOSIS — E119 Type 2 diabetes mellitus without complications: Secondary | ICD-10-CM

## 2014-07-20 DIAGNOSIS — I63412 Cerebral infarction due to embolism of left middle cerebral artery: Secondary | ICD-10-CM

## 2014-07-20 DIAGNOSIS — E785 Hyperlipidemia, unspecified: Secondary | ICD-10-CM

## 2014-07-20 NOTE — Progress Notes (Signed)
STROKE NEUROLOGY FOLLOW UP NOTE  NAME: [name] DOB: [birth year]  REASON FOR VISIT: stroke follow up HISTORY FROM: pt and chart  Today we had the pleasure of seeing [name] in follow-up at our Neurology Clinic. Pt was accompanied by no one.   History Summary [name] is a 81 y.o. male with PMH of DM and HLD who was seen on 02/03/14 as a new patient for hosptial follow up of his stroke.  He was admitted on 12/22/13 for difficulty getting words out. He had similar episode about 5 years ago and then for the last 3 weeks prior to admission, he had two more episodes. MRI showed left temporal cortical stroke, consistent with emboli stroke. His stroke work up unrevealing including TTE, CUS, MRA, 30 day cardiac monitoring (done by Dr. Wynonia Lawman, I have not have any result available to review in chart). As per pt, Dr. Wynonia Lawman told him that no afib was detected during the monitoring. He was taking plavix prior to admission. Before plavix, he was taking ASA. He was continued on plavix and zocor, but was introduced to RESPECT ESUS trial.  04/25/14 follow up - the patient has been doing well. No recurrent stroke like symptoms. He is on plavix and zocor. His BP in good control today 129/82 in clinic. He is interested in the research trial and tried to contact us but not successful.  Interval History During the interval time, patient was enrolled in [place] trial. Patient has been doing well until 06/28/2014 while he was driving in the afternoon, he had a 30 minute episode of speech difficulties, difficulty getting words out, and right hand numbness. He had to stop and switch driving with his wife. The episode cleared in 30 minutes. Symptoms similar to his previous TIA and a stroke. He went to ED for further evaluation, MRI was negative for acute stroke. He was discharged from ER without admission. Patient condition consistent with TIA episode. He was remained in the trial with continuation of  investigational drugs.  REVIEW OF SYSTEMS: Full 14 system review of systems performed and notable only for those listed below and in HPI above, all others are negative:  Constitutional:   Cardiovascular:  Ear/Nose/Throat:   Skin:  Eyes:   Respiratory:   Gastroitestinal:   Genitourinary:  Hematology/Lymphatic:   Endocrine:  Musculoskeletal:   Allergy/Immunology:   Neurological:   Psychiatric:  Sleep:   The following represents the patient's updated allergies and side effects list: No Known Allergies  The neurologically relevant items on the patient's problem list were reviewed on today's visit.  Neurologic Examination  A problem focused neurological exam (12 or more points of the single system neurologic examination, vital signs counts as 1 point, cranial nerves count for 8 points) was performed.  There were no vitals taken for this visit.  General - Well nourished, well developed, in no apparent distress.  Ophthalmologic - Sharp disc margins OU.  Cardiovascular - Regular rate and rhythm with no murmur.  Mental Status -  Level of arousal and orientation to time, place, and person were intact. Language including expression, naming, repetition, comprehension, reading, and writing was assessed and found intact. Attention span and concentration were normal. Recent and remote memory were intact. Fund of Knowledge was assessed and was intact.  Cranial Nerves II - XII - II - Visual field intact OU. III, IV, VI - Extraocular movements intact. V - Facial sensation intact bilaterally. VII - Facial movement intact bilaterally. VIII -  Hearing & vestibular intact bilaterally. X - Palate elevates symmetrically. XI - Chin turning & shoulder shrug intact bilaterally. XII - Tongue protrusion intact.  Motor Strength - The patient's strength was normal in all extremities and pronator drift was absent.  Bulk was normal and fasciculations were absent.   Motor Tone - Muscle tone was  assessed at the neck and appendages and was normal.  Reflexes - The patient's reflexes were symmetrical in all extremities and he had no pathological reflexes.  Sensory - Light touch, temperature/pinprick were assessed and were normal.    Coordination - The patient had normal movements in the hands and feet with no ataxia or dysmetria.  Tremor was absent.  Gait and Station - mildly slow, but steady.  Data reviewed: I personally reviewed the images and agree with the radiology interpretations.  Ct Head Wo Contrast 12/22/2013  Progression of atrophy and chronic microvascular ischemia. No acute abnormality. Sinusitis with air-fluid levels   Mri Brain Without Contrast 06/28/14 MRI -No acute intracranial abnormality. Stable MRI appearance of the brain since 2015, chronic small vessel disease.   12/22/2013  MRI HEAD  1. 1.8 cm linear ischemic infarct within the posterior left frontotemporal region without significant mass effect or hemorrhage.  2. Atrophy with moderate chronic small vessel ischemic disease.  3. Small remote lacunar infarcts within the right basal ganglia.   MRA HEAD  No proximal branch occlusion or hemodynamically significant stenosis identified within the intracranial circulation.  2D echo - - Left ventricle: The cavity size was normal. Wall thickness was increased in a pattern of mild LVH. There was mild focal basal hypertrophy of the septum. The estimated ejection fraction was 55%. Doppler parameters are consistent with abnormal left ventricular relaxation (grade 1 diastolic dysfunction). - Left atrium: The atrium was mildly dilated. - Atrial septum: No defect or patent foramen ovale was identified.  CUS - Bilateral: 1-39% ICA stenosis. Vertebral artery flow is antegrade.  Component     Latest Ref Rng 12/22/2013 12/23/2013  Cholesterol     0 - 200 mg/dL  152  Triglycerides     <150 mg/dL  168 (H)  HDL     >39 mg/dL  42  Total CHOL/HDL  Ratio       3.6  VLDL     0 - 40 mg/dL  34  LDL (calc)     0 - 99 mg/dL  76  Hemoglobin A1C     <5.7 % 6.7 (H)   Mean Plasma Glucose     <117 mg/dL 146 (H)     Assessment: As you may recall, he is a 81 y.o. Caucasian male with PMH of DM and HLD was admitted on 12/22/13 for episode of difficulty getting words out. He apparently had similar episodes 5 years ago and for the last 3 weeks prior to admission. MRI showed left temporal cortical stroke, consistent with emboli stroke. His stroke work up so far unrevealing including TTE, CUS, MRA, 30 day cardiac monitoring (done by Dr. Wynonia Lawman, no afib as per pt but no result in chart). His stroke risk factor also in good control. He was continued on his plavix and zocor. However, for the treatment dilema, he was introduced with RESPECT ESUS and enrolled in 04/2014. However on 06/28/2014, patient had TIA episode with word finding difficulties and right arm numbness, resembles his previous TIA and stroke. MRI negative for acute stroke, he was remained in the trial with continuation of investigational drugs.   Plan:  - continue  investigational meds and remain in the REPECT ESUS trial - Patient question answered and he agreed to stay with the trial - continue zocor for stroke prevention - Follow up with your primary care physician for stroke risk factor modification. Recommend maintain blood pressure goal <130/80, diabetes with hemoglobin A1c goal below 6.5% and lipids with LDL cholesterol goal below 70 mg/dL.  - check BP at home - follow up with me in research follow up visits.  No orders of the defined types were placed in this encounter.    No orders of the defined types were placed in this encounter.    There are no Patient Instructions on file for this visit.  [name], MD PhD [facility] [address], [place], [place] [address] [phone]

## 2014-08-08 ENCOUNTER — Other Ambulatory Visit: Payer: Self-pay | Admitting: Neurology

## 2014-08-08 ENCOUNTER — Encounter (INDEPENDENT_AMBULATORY_CARE_PROVIDER_SITE_OTHER): Payer: Self-pay

## 2014-08-08 ENCOUNTER — Telehealth: Payer: Self-pay | Admitting: Neurology

## 2014-08-08 DIAGNOSIS — Z0289 Encounter for other administrative examinations: Secondary | ICD-10-CM

## 2014-08-08 DIAGNOSIS — G40109 Localization-related (focal) (partial) symptomatic epilepsy and epileptic syndromes with simple partial seizures, not intractable, without status epilepticus: Secondary | ICD-10-CM

## 2014-08-08 MED ORDER — DIVALPROEX SODIUM ER 500 MG PO TB24: 500.0000 mg | ORAL_TABLET | Freq: Every day | ORAL | Status: DC

## 2014-08-09 ENCOUNTER — Telehealth: Payer: Self-pay | Admitting: Neurology

## 2014-08-09 NOTE — Telephone Encounter (Signed)
Patient called and requested to speak with Verneita Griffes RN regarding his MRI. Please call and advise.

## 2014-08-14 NOTE — Telephone Encounter (Signed)
Left second message for patient. Unsure if his call was regarding his upcoming MRI scheduled for 08/23/14, ordered by Dr Leonie Man. Left this caller's name, number, and this office's hours.

## 2014-08-15 ENCOUNTER — Ambulatory Visit (INDEPENDENT_AMBULATORY_CARE_PROVIDER_SITE_OTHER): Payer: Medicare Other | Admitting: Diagnostic Neuroimaging

## 2014-08-15 ENCOUNTER — Telehealth: Payer: Self-pay | Admitting: *Deleted

## 2014-08-15 DIAGNOSIS — G40109 Localization-related (focal) (partial) symptomatic epilepsy and epileptic syndromes with simple partial seizures, not intractable, without status epilepticus: Secondary | ICD-10-CM

## 2014-08-15 NOTE — Telephone Encounter (Signed)
Pt called in returning a call for Verneita Griffes, RN. He wanted to let her know that he had his EEG this am and that he does not need a return call. Malena Catholic

## 2014-08-20 NOTE — Procedures (Signed)
   [facility]  EEG (ELECTROENCEPHALOGRAM) REPORT   STUDY DATE: 08/15/14 PATIENT NAME: [name] DOB: [birth year] MRN: [id number]  ORDERING CLINICIAN: [name], MD PhD   TECHNOLOGIST: Laretta Alstrom  TECHNIQUE: Electroencephalogram was recorded utilizing standard 10-20 system of lead placement and reformatted into average and bipolar montages.  RECORDING TIME: 24 minutes  ACTIVATION: none  CLINICAL INFORMATION: 81 year old male with left temporal stroke; evaluate for TIA vs partial seizure.  FINDINGS: Background rhythms of 8-9 hertz and 20-30 microvolts on the right, and 7-8 hertz and 20-30 microvolts on the left. No epileptiform activity or seizures are seen. Patient recorded in the awake state.    IMPRESSION:  Mildly abnormal study demonstrating: 1. Mild left-sided hemispheric slowing, could be related to prior stroke/infarct. 2. No epileptiform activity or seizures are seen.   INTERPRETING PHYSICIAN:  Penni Bombard, MD Certified in Neurology, Neurophysiology and Neuroimaging  [facility] [address] [place], [place] [address] [phone]

## 2014-08-21 ENCOUNTER — Telehealth: Payer: Self-pay | Admitting: *Deleted

## 2014-08-21 NOTE — Telephone Encounter (Signed)
-----   Message from Garvin Fila, MD sent at 08/21/2014  3:43 PM EDT ----- Mitchell Heir inform the patient that the EEG on 08/15/14 showed no definite evidence of seizure activity but showed mild slowing on the left side which is consistent with patient's known history of stroke in that area. No worrisome findings

## 2014-08-21 NOTE — Telephone Encounter (Signed)
Per Dr Leonie Man, spoke with patient and informed him of Dr Clydene Fake detailed report of his 08/15/14 MRI results. Patient verbalized understanding, appreciation.

## 2014-08-23 ENCOUNTER — Ambulatory Visit
Admission: RE | Admit: 2014-08-23 | Discharge: 2014-08-23 | Disposition: A | Discharge status: Home / Self Care | Payer: Medicare Other | Source: Ambulatory Visit | Attending: Neurology | Admitting: Neurology

## 2014-08-23 DIAGNOSIS — G40109 Localization-related (focal) (partial) symptomatic epilepsy and epileptic syndromes with simple partial seizures, not intractable, without status epilepticus: Secondary | ICD-10-CM

## 2014-08-23 MED ORDER — GADOBENATE DIMEGLUMINE 529 MG/ML IV SOLN
16.0000 mL | Freq: Once | INTRAVENOUS | Status: AC | PRN
  Administered 2014-08-23: 16 mL via INTRAVENOUS

## 2014-11-07 ENCOUNTER — Encounter (INDEPENDENT_AMBULATORY_CARE_PROVIDER_SITE_OTHER): Payer: Self-pay

## 2014-11-07 DIAGNOSIS — Z0289 Encounter for other administrative examinations: Secondary | ICD-10-CM

## 2014-11-08 ENCOUNTER — Telehealth: Payer: Self-pay | Admitting: Neurology

## 2014-11-08 NOTE — Telephone Encounter (Signed)
I called Golden Pop and left messages on both his home and cellular numbers.  I let him know that I needed to reschedule is Respect-Esus Visit 5 visit.  I asked if he could call me back at [phone].

## 2014-12-10 ENCOUNTER — Telehealth: Payer: Self-pay

## 2014-12-10 NOTE — Telephone Encounter (Signed)
Rn call patients PCP at Internal medicine at [phone] and [name] is a patient there. [name] new PCP is Dr. Seward Carol at the practice. He has a physical in February 2016 and was last seen in September 2016.

## 2014-12-10 NOTE — Telephone Encounter (Signed)
Rn call [facility] office at [phone] to notify them that [name] is not the primary doctor over the patients care. Rn stated to office that pt is only in a research study and was last seen by Dr. Erlinda Hong in June 2016. The RN stated who the pts PCP was over the patients care.  Pts surgery is next Tuesday. Rn gave the office the patients PCP. The office wants to know the medications the patient is on in the research study. Rn stated Dr. Erlinda Hong will be notified.

## 2014-12-12 NOTE — Telephone Encounter (Signed)
Rn call [facility] office about patients clearance form. Rn explain to [place](treatment coordinator) Dr. Leonie Man is not the primary doctor over the patient he is the neurology doctor. Nurse stated is in a investigational study for research at [facility], and he is to hold the drug 3 days prior Clearance form was fax and sent to [place].

## 2015-01-23 ENCOUNTER — Telehealth: Payer: Self-pay | Admitting: Neurology

## 2015-01-23 NOTE — Telephone Encounter (Signed)
[name] called and left a message on my voicemail.  He wanted to confirm that he has an appointment for Dec. 15th, 2016.  I called him back and left a message on his voicemail.  I confirmed his appointment for Thursday, Dec. 15th at 10:00am.

## 2015-01-31 ENCOUNTER — Encounter (INDEPENDENT_AMBULATORY_CARE_PROVIDER_SITE_OTHER): Payer: Self-pay

## 2015-01-31 DIAGNOSIS — Z0289 Encounter for other administrative examinations: Secondary | ICD-10-CM

## 2015-02-19 DIAGNOSIS — Z111 Encounter for screening for respiratory tuberculosis: Secondary | ICD-10-CM | POA: Diagnosis not present

## 2015-02-27 DIAGNOSIS — E119 Type 2 diabetes mellitus without complications: Secondary | ICD-10-CM | POA: Diagnosis not present

## 2015-02-27 DIAGNOSIS — I10 Essential (primary) hypertension: Secondary | ICD-10-CM | POA: Diagnosis not present

## 2015-03-29 ENCOUNTER — Telehealth: Payer: Self-pay | Admitting: Neurology

## 2015-03-29 DIAGNOSIS — J302 Other seasonal allergic rhinitis: Secondary | ICD-10-CM | POA: Diagnosis not present

## 2015-03-29 DIAGNOSIS — J4521 Mild intermittent asthma with (acute) exacerbation: Secondary | ICD-10-CM | POA: Diagnosis not present

## 2015-03-29 NOTE — Telephone Encounter (Signed)
I received an incoming call from [name].  He stated that he had recently changed his address and wanted to confirm his research appointment time.  I let him know that he is scheduled for May 08, 2015 at 10:00am.  He said that he may have to reschedule that appointment due to travel, however, he would like to call and confirm his travel plans before cancelling his appointment.  He stated that if he did not call me back, then the appointment is fine.  He said if there is a conflict, he will call back and reschedule.  I stated that would be fine.

## 2015-04-05 DIAGNOSIS — Z0000 Encounter for general adult medical examination without abnormal findings: Secondary | ICD-10-CM | POA: Diagnosis not present

## 2015-04-05 DIAGNOSIS — I679 Cerebrovascular disease, unspecified: Secondary | ICD-10-CM | POA: Diagnosis not present

## 2015-04-05 DIAGNOSIS — J309 Allergic rhinitis, unspecified: Secondary | ICD-10-CM | POA: Diagnosis not present

## 2015-04-05 DIAGNOSIS — J45909 Unspecified asthma, uncomplicated: Secondary | ICD-10-CM | POA: Diagnosis not present

## 2015-04-05 DIAGNOSIS — E7800 Pure hypercholesterolemia, unspecified: Secondary | ICD-10-CM | POA: Diagnosis not present

## 2015-04-05 DIAGNOSIS — E119 Type 2 diabetes mellitus without complications: Secondary | ICD-10-CM | POA: Diagnosis not present

## 2015-04-05 DIAGNOSIS — G4700 Insomnia, unspecified: Secondary | ICD-10-CM | POA: Diagnosis not present

## 2015-04-05 DIAGNOSIS — Z1389 Encounter for screening for other disorder: Secondary | ICD-10-CM | POA: Diagnosis not present

## 2015-05-03 DIAGNOSIS — J4520 Mild intermittent asthma, uncomplicated: Secondary | ICD-10-CM | POA: Diagnosis not present

## 2015-05-03 DIAGNOSIS — J018 Other acute sinusitis: Secondary | ICD-10-CM | POA: Diagnosis not present

## 2015-05-03 DIAGNOSIS — R05 Cough: Secondary | ICD-10-CM | POA: Diagnosis not present

## 2015-05-03 DIAGNOSIS — J302 Other seasonal allergic rhinitis: Secondary | ICD-10-CM | POA: Diagnosis not present

## 2015-05-08 ENCOUNTER — Encounter (INDEPENDENT_AMBULATORY_CARE_PROVIDER_SITE_OTHER): Payer: Self-pay

## 2015-05-08 DIAGNOSIS — Z0289 Encounter for other administrative examinations: Secondary | ICD-10-CM

## 2015-06-06 DIAGNOSIS — J4541 Moderate persistent asthma with (acute) exacerbation: Secondary | ICD-10-CM | POA: Diagnosis not present

## 2015-07-25 ENCOUNTER — Observation Stay (HOSPITAL_COMMUNITY)
Admission: EM | Admit: 2015-07-25 | Discharge: 2015-07-26 | Disposition: A | Discharge status: Home / Self Care | Payer: Medicare HMO | Attending: Family Medicine | Admitting: Family Medicine

## 2015-07-25 ENCOUNTER — Emergency Department (HOSPITAL_COMMUNITY): Payer: Medicare HMO

## 2015-07-25 ENCOUNTER — Encounter (HOSPITAL_COMMUNITY): Payer: Self-pay

## 2015-07-25 ENCOUNTER — Observation Stay (HOSPITAL_COMMUNITY): Payer: Medicare HMO

## 2015-07-25 DIAGNOSIS — Z7982 Long term (current) use of aspirin: Secondary | ICD-10-CM | POA: Insufficient documentation

## 2015-07-25 DIAGNOSIS — R531 Weakness: Secondary | ICD-10-CM | POA: Insufficient documentation

## 2015-07-25 DIAGNOSIS — R262 Difficulty in walking, not elsewhere classified: Secondary | ICD-10-CM | POA: Diagnosis not present

## 2015-07-25 DIAGNOSIS — I639 Cerebral infarction, unspecified: Secondary | ICD-10-CM | POA: Diagnosis not present

## 2015-07-25 DIAGNOSIS — M1990 Unspecified osteoarthritis, unspecified site: Secondary | ICD-10-CM | POA: Diagnosis not present

## 2015-07-25 DIAGNOSIS — I10 Essential (primary) hypertension: Secondary | ICD-10-CM | POA: Insufficient documentation

## 2015-07-25 DIAGNOSIS — D72829 Elevated white blood cell count, unspecified: Secondary | ICD-10-CM | POA: Insufficient documentation

## 2015-07-25 DIAGNOSIS — K219 Gastro-esophageal reflux disease without esophagitis: Secondary | ICD-10-CM | POA: Insufficient documentation

## 2015-07-25 DIAGNOSIS — E119 Type 2 diabetes mellitus without complications: Secondary | ICD-10-CM | POA: Diagnosis not present

## 2015-07-25 DIAGNOSIS — G4700 Insomnia, unspecified: Secondary | ICD-10-CM | POA: Diagnosis not present

## 2015-07-25 DIAGNOSIS — Z8673 Personal history of transient ischemic attack (TIA), and cerebral infarction without residual deficits: Secondary | ICD-10-CM | POA: Diagnosis not present

## 2015-07-25 DIAGNOSIS — R569 Unspecified convulsions: Secondary | ICD-10-CM

## 2015-07-25 DIAGNOSIS — E785 Hyperlipidemia, unspecified: Secondary | ICD-10-CM | POA: Diagnosis not present

## 2015-07-25 DIAGNOSIS — R404 Transient alteration of awareness: Secondary | ICD-10-CM | POA: Diagnosis not present

## 2015-07-25 DIAGNOSIS — R42 Dizziness and giddiness: Secondary | ICD-10-CM | POA: Diagnosis not present

## 2015-07-25 DIAGNOSIS — E118 Type 2 diabetes mellitus with unspecified complications: Secondary | ICD-10-CM | POA: Insufficient documentation

## 2015-07-25 DIAGNOSIS — J449 Chronic obstructive pulmonary disease, unspecified: Secondary | ICD-10-CM | POA: Diagnosis not present

## 2015-07-25 DIAGNOSIS — IMO0001 Reserved for inherently not codable concepts without codable children: Secondary | ICD-10-CM

## 2015-07-25 HISTORY — DX: Cerebral infarction, unspecified: I63.9

## 2015-07-25 LAB — CBC
HCT: 49.5 % (ref 39.0–52.0)
Hemoglobin: 16 g/dL (ref 13.0–17.0)
MCH: 31.1 pg (ref 26.0–34.0)
MCHC: 32.3 g/dL (ref 30.0–36.0)
MCV: 96.3 fL (ref 78.0–100.0)
PLATELETS: 305 10*3/uL (ref 150–400)
RBC: 5.14 MIL/uL (ref 4.22–5.81)
RDW: 14.6 % (ref 11.5–15.5)
WBC: 16.3 10*3/uL — AB (ref 4.0–10.5)

## 2015-07-25 LAB — BASIC METABOLIC PANEL
ANION GAP: 6 (ref 5–15)
BUN: 13 mg/dL (ref 6–20)
CHLORIDE: 104 mmol/L (ref 101–111)
CO2: 28 mmol/L (ref 22–32)
Calcium: 8.6 mg/dL — ABNORMAL LOW (ref 8.9–10.3)
Creatinine, Ser: 0.77 mg/dL (ref 0.61–1.24)
GFR calc Af Amer: >60 mL/min (ref >60)
GLUCOSE: 170 mg/dL — AB (ref 65–99)
POTASSIUM: 3.7 mmol/L (ref 3.5–5.1)
Sodium: 138 mmol/L (ref 135–145)

## 2015-07-25 LAB — URINALYSIS, ROUTINE W REFLEX MICROSCOPIC
Bilirubin Urine: NEGATIVE
GLUCOSE, UA: NEGATIVE mg/dL
Hgb urine dipstick: NEGATIVE
Ketones, ur: NEGATIVE mg/dL
LEUKOCYTES UA: NEGATIVE
Nitrite: NEGATIVE
PROTEIN: NEGATIVE mg/dL
Specific Gravity, Urine: 1.015 (ref 1.005–1.030)
pH: 7 (ref 5.0–8.0)

## 2015-07-25 LAB — GLUCOSE, CAPILLARY
GLUCOSE-CAPILLARY: 141 mg/dL — AB (ref 65–99)
Glucose-Capillary: 69 mg/dL (ref 65–99)

## 2015-07-25 LAB — CBG MONITORING, ED: GLUCOSE-CAPILLARY: 156 mg/dL — AB (ref 65–99)

## 2015-07-25 LAB — VALPROIC ACID LEVEL: Valproic Acid Lvl: <10 ug/mL — ABNORMAL LOW (ref 50.0–100.0)

## 2015-07-25 MED ORDER — DIVALPROEX SODIUM ER 500 MG PO TB24
500.0000 mg | ORAL_TABLET | Freq: Every day | ORAL | Status: DC
  Filled 2015-07-25: qty 1

## 2015-07-25 MED ORDER — IOPAMIDOL (ISOVUE-370) INJECTION 76%
INTRAVENOUS | Status: AC
  Administered 2015-07-25: 50 mL
  Filled 2015-07-25: qty 50

## 2015-07-25 MED ORDER — ZOLPIDEM TARTRATE 5 MG PO TABS
5.0000 mg | ORAL_TABLET | Freq: Every evening | ORAL | Status: DC | PRN
  Administered 2015-07-25: 5 mg via ORAL
  Filled 2015-07-25: qty 1

## 2015-07-25 MED ORDER — ACETAMINOPHEN 325 MG PO TABS: 650.0000 mg | ORAL_TABLET | Freq: Four times a day (QID) | ORAL | Status: DC | PRN

## 2015-07-25 MED ORDER — ONDANSETRON HCL 4 MG PO TABS: 4.0000 mg | ORAL_TABLET | Freq: Four times a day (QID) | ORAL | Status: DC | PRN

## 2015-07-25 MED ORDER — SIMVASTATIN 20 MG PO TABS
20.0000 mg | ORAL_TABLET | Freq: Every day | ORAL | Status: DC
  Administered 2015-07-25 – 2015-07-26 (×2): 20 mg via ORAL
  Filled 2015-07-25 (×2): qty 1

## 2015-07-25 MED ORDER — SODIUM CHLORIDE 0.9 % IV SOLN: 250.0000 mL | INTRAVENOUS | Status: DC | PRN

## 2015-07-25 MED ORDER — SODIUM CHLORIDE 0.9% FLUSH: 3.0000 mL | INTRAVENOUS | Status: DC | PRN

## 2015-07-25 MED ORDER — INSULIN ASPART 100 UNIT/ML ~~LOC~~ SOLN: 0.0000 [IU] | Freq: Every day | SUBCUTANEOUS | Status: DC

## 2015-07-25 MED ORDER — ENOXAPARIN SODIUM 40 MG/0.4ML ~~LOC~~ SOLN
40.0000 mg | SUBCUTANEOUS | Status: DC
  Administered 2015-07-25: 40 mg via SUBCUTANEOUS
  Filled 2015-07-25: qty 0.4

## 2015-07-25 MED ORDER — HYDRALAZINE HCL 20 MG/ML IJ SOLN: 5.0000 mg | INTRAMUSCULAR | Status: DC | PRN

## 2015-07-25 MED ORDER — INSULIN ASPART 100 UNIT/ML ~~LOC~~ SOLN
0.0000 [IU] | Freq: Three times a day (TID) | SUBCUTANEOUS | Status: DC
  Administered 2015-07-26: 2 [IU] via SUBCUTANEOUS

## 2015-07-25 MED ORDER — MONTELUKAST SODIUM 10 MG PO TABS
10.0000 mg | ORAL_TABLET | Freq: Every evening | ORAL | Status: DC
  Administered 2015-07-25: 10 mg via ORAL
  Filled 2015-07-25: qty 1

## 2015-07-25 MED ORDER — ASPIRIN 300 MG RE SUPP: 300.0000 mg | Freq: Every day | RECTAL | Status: DC

## 2015-07-25 MED ORDER — SODIUM CHLORIDE 0.9% FLUSH: 3.0000 mL | Freq: Two times a day (BID) | INTRAVENOUS | Status: DC

## 2015-07-25 MED ORDER — MOMETASONE FURO-FORMOTEROL FUM 200-5 MCG/ACT IN AERO
2.0000 | INHALATION_SPRAY | Freq: Two times a day (BID) | RESPIRATORY_TRACT | Status: DC
  Filled 2015-07-25: qty 8.8

## 2015-07-25 MED ORDER — STROKE: EARLY STAGES OF RECOVERY BOOK
Freq: Once | Status: DC
  Filled 2015-07-25: qty 1

## 2015-07-25 MED ORDER — ACETAMINOPHEN 650 MG RE SUPP: 650.0000 mg | Freq: Four times a day (QID) | RECTAL | Status: DC | PRN

## 2015-07-25 MED ORDER — ONDANSETRON HCL 4 MG/2ML IJ SOLN: 4.0000 mg | Freq: Four times a day (QID) | INTRAMUSCULAR | Status: DC | PRN

## 2015-07-25 MED ORDER — SODIUM CHLORIDE 0.9% FLUSH
3.0000 mL | Freq: Two times a day (BID) | INTRAVENOUS | Status: DC
Start: 2015-07-25 — End: 2015-07-26
  Administered 2015-07-25: 3 mL via INTRAVENOUS

## 2015-07-25 MED ORDER — ASPIRIN 325 MG PO TABS
325.0000 mg | ORAL_TABLET | Freq: Once | ORAL | Status: AC
  Administered 2015-07-25: 325 mg via ORAL
  Filled 2015-07-25: qty 1

## 2015-07-25 MED ORDER — ASPIRIN 325 MG PO TABS
325.0000 mg | ORAL_TABLET | Freq: Every day | ORAL | Status: DC
  Administered 2015-07-26: 325 mg via ORAL
  Filled 2015-07-25: qty 1

## 2015-07-25 MED ORDER — OXYCODONE HCL 5 MG PO TABS: 5.0000 mg | ORAL_TABLET | ORAL | Status: DC | PRN

## 2015-07-25 NOTE — Consult Note (Signed)
Requesting Physician: Dr. Saralyn Pilar    Chief Complaint: Stroke    HPI:                                                                                                                                         [name] is an 82 y.o. male presented tot he ED after he noted dizziness that started at 0900 hours this AM. He felt unsteady on his feet but had no focal weakness or numbness.He states he felt dizzy and very nauseated including having bouts of emesis. Wife noted he feel to the floor to the right side and he was very diaphoretic.  He has had a stroke in the past with some residual speech deficits. He currently is enrolled in the RESPECT trial at [facility]. Currently he has no symptoms. MRI obtained did show tiny acute infarct in the superior left occipital lobe. Family is very concerned as a recent member recently died due to a pontine infract. Neurology was asked to see patient.   Per old note in 2015 from Dr. Ellender Hose is a 82 y.o. male with PMH of DM and HLD presents episodes of difficulty getting words out. The first episode was about 5 years ago and then for the last 3 weeks, he had two more similar episodes. MRI showed left temporal cortical stroke, consistent with emboli stroke. His stroke work up so far unrevealing including TTE, CUS, MRA, 30 day cardiac monitoring (done by Dr. Wynonia Lawman, I have not have any result available to review). He apparently failed ASA and plavix at this point. However, we do not have indication for anticoagulation. For the treatment dilema, we will introduce RESPECT ESUS to him.   Date last known well: Today Time last known well: Time: 09:00 tPA Given: No: symptoms resolved   Past Medical History  Diagnosis Date  . Diverticulosis   . Hiatal hernia   . Colitis   . Hemorrhoids   . Colon polyps   . Proctitis   . GERD (gastroesophageal reflux disease)   . Osteoarthritis   . Asthma   . COPD (chronic obstructive pulmonary disease) ([place])   . Status post  dilation of esophageal narrowing   . HLD (hyperlipidemia)   . Stroke ([place])   . Diabetes mellitus type 2, diet-controlled ([place]) 12/22/2013  . History of esophageal strciture   . Dyslipidemia 06/24/2007    Past Surgical History  Procedure Laterality Date  . Nissen    . Splenectomy, total    . Esophageal hernia    . Abdominal hernia repair    . Ventral hernia repair  01/29/2012     WITH MESH  . Ventral hernia repair  01/29/2012    Procedure: HERNIA REPAIR VENTRAL ADULT;  Surgeon: Gwenyth Ober, MD;  Location: [place];  Service: General;  Laterality: N/A;  open recurrent ventral hernia repair with mesh  . Insertion  of mesh  01/29/2012    Procedure: INSERTION OF MESH;  Surgeon: Gwenyth Ober, MD;  Location: [place];  Service: General;  Laterality: N/A;    Family History  Problem Relation Age of Onset  . Cancer Father    Social History:  reports that he has never smoked. He has never used smokeless tobacco. He reports that he drinks alcohol. He reports that he does not use illicit drugs.  Allergies: No Known Allergies  Medications:                                                                                                                           No current facility-administered medications for this encounter.   Current Outpatient Prescriptions  Medication Sig Dispense Refill  . divalproex (DEPAKOTE ER) 500 MG 24 hr tablet Take 1 tablet (500 mg total) by mouth daily. 60 tablet 1  . fluticasone (FLONASE) 50 MCG/ACT nasal spray Place 2 sprays into both nostrils daily.  5  . fluticasone-salmeterol (ADVAIR HFA) 115-21 MCG/ACT inhaler Inhale 2 puffs into the lungs 2 (two) times daily.    . Magnesium Gluconate 500 (27 MG) MG TABS Take 500 mg by mouth at bedtime.    . montelukast (SINGULAIR) 10 MG tablet Take 10 mg by mouth every evening.  6  . Multiple Vitamin (MULTIVITAMIN WITH MINERALS) TABS Take 1 tablet by mouth daily.    . simvastatin (ZOCOR) 20 MG tablet Take 20 mg by mouth daily.     Marland Kitchen zolpidem (AMBIEN) 5 MG tablet Take 1 tablet (5 mg total) by mouth at bedtime as needed for sleep. 20 tablet 0     ROS:                                                                                                                                       History obtained from the patient  General ROS: negative for - chills, fatigue, fever, night sweats, weight gain or weight loss Psychological ROS: negative for - behavioral disorder, hallucinations, memory difficulties, mood swings or suicidal ideation Ophthalmic ROS: negative for - blurry vision, double vision, eye pain or loss of vision ENT ROS: negative for - epistaxis, nasal discharge, oral lesions, sore throat, tinnitus or vertigo Allergy and Immunology ROS: negative for - hives or itchy/watery eyes Hematological and Lymphatic ROS: negative for - bleeding problems, bruising or swollen lymph nodes  Endocrine ROS: negative for - galactorrhea, hair pattern changes, polydipsia/polyuria or temperature intolerance Respiratory ROS: negative for - cough, hemoptysis, shortness of breath or wheezing Cardiovascular ROS: negative for - chest pain, dyspnea on exertion, edema or irregular heartbeat Gastrointestinal ROS: negative for - abdominal pain, diarrhea, hematemesis, nausea/vomiting or stool incontinence Genito-Urinary ROS: negative for - dysuria, hematuria, incontinence or urinary frequency/urgency Musculoskeletal ROS: negative for - joint swelling or muscular weakness Neurological ROS: as noted in HPI Dermatological ROS: negative for rash and skin lesion changes  Neurologic Examination:                                                                                                      Blood pressure 146/102, pulse 72, temperature 97.6 F (36.4 C), temperature source Oral, resp. rate 22, height 5\' 11"  (1.803 m), weight 72.576 kg (160 lb), SpO2 97 %.  HEENT-  Normocephalic, no lesions, without obvious abnormality.  Normal external eye and  conjunctiva.  Normal TM's bilaterally.  Normal auditory canals and external ears. Normal external nose, mucus membranes and septum.  Normal pharynx. Cardiovascular- S1, S2 normal, pulses palpable throughout   Lungs- chest clear, no wheezing, rales, normal symmetric air entry Abdomen- normal findings: bowel sounds normal Extremities- no edema Lymph-no adenopathy palpable Musculoskeletal-no joint tenderness, deformity or swelling Skin-warm and dry, no hyperpigmentation, vitiligo, or suspicious lesions  Neurological Examination Mental Status: Alert, oriented, thought content appropriate.  Speech fluent without evidence of aphasia.  Able to follow 3 step commands without difficulty. Cranial Nerves: II:; Visual fields grossly normal, pupils equal, round, reactive to light and accommodation III,IV, VI: ptosis not present, extra-ocular motions intact bilaterally V,VII: smile symmetric, facial light touch sensation normal bilaterally VIII: hearing normal bilaterally IX,X: uvula rises symmetrically XI: bilateral shoulder shrug XII: midline tongue extension Motor: Right : Upper extremity   5/5    Left:     Upper extremity   5/5  Lower extremity   5/5     Lower extremity   5/5 Tone and bulk:normal tone throughout; no atrophy noted Sensory: Pinprick and light touch intact throughout, bilaterally Deep Tendon Reflexes: 2+ and symmetric throughout Plantars: Right: downgoing   Left: downgoing Cerebellar: normal finger-to-nose, normal rapid alternating movements and normal heel-to-shin test Gait: not tested       Lab Results: Basic Metabolic Panel:  Recent Labs Lab 07/25/15 1051  NA 138  K 3.7  CL 104  CO2 28  GLUCOSE 170*  BUN 13  CREATININE 0.77  CALCIUM 8.6*    Liver Function Tests: No results for input(s): AST, ALT, ALKPHOS, BILITOT, PROT, ALBUMIN in the last 168 hours. No results for input(s): LIPASE, AMYLASE in the last 168 hours. No results for input(s): AMMONIA in the  last 168 hours.  CBC: No results for input(s): WBC, NEUTROABS, HGB, HCT, MCV, PLT in the last 168 hours.  Cardiac Enzymes: No results for input(s): CKTOTAL, CKMB, CKMBINDEX, TROPONINI in the last 168 hours.  Lipid Panel: No results for input(s): CHOL, TRIG, HDL, CHOLHDL, VLDL, LDLCALC in the last 168 hours.  CBG:  Recent Labs Lab  07/25/15 [place]*    Microbiology: Results for orders placed or performed in visit on 02/08/12  Stool C-Diff Toxin Assay     Status: None   Collection Time: 02/08/12 12:09 PM  Result Value Ref Range Status   CDIFTX NEGATIVE  Final    Coagulation Studies: No results for input(s): LABPROT, INR in the last 72 hours.  Imaging: Mr Brain Wo Contrast (neuro Protocol)  07/25/2015  CLINICAL DATA:  82 year old male who awoke with severe dizziness and lower extremity weakness today. Initial encounter. EXAM: MRI HEAD WITHOUT CONTRAST TECHNIQUE: Multiplanar, multiecho pulse sequences of the brain and surrounding structures were obtained without intravenous contrast. COMPARISON:  Brain MRI 08/23/2014 and earlier. FINDINGS: Major intracranial vascular flow voids are stable. Small 4 mm area of cortical restricted diffusion suspected in the superior left occipital lobe on series 4, image 29. Diffusion elsewhere appears within normal limits. No associated mass effect, hemorrhage, or definite T2 signal abnormality. No midline shift, mass effect, evidence of mass lesion, ventriculomegaly, extra-axial collection or acute intracranial hemorrhage. Cervicomedullary junction and pituitary are within normal limits. Aside from the small acute finding gray and white matter signal in the brain is stable since 2016 including scattered moderate for age white matter T2 and FLAIR hyperintensity. Small chronic micro hemorrhage in the right thalamus is stable. Chronic widespread T2 heterogeneity in the deep gray matter again noted and in part due to increased perivascular spaces. There  might be a tiny chronic lacune in the right pons on series 6, image 9 which was not evident previously. The cerebellum is stable and within normal limits. Visible internal auditory structures appear normal. Chronic paranasal sinus disease with worsening opacification of the frontal and ethmoid sinuses from the prior study. Susceptibility artifact today about the tube right maxilla of unclear significance. Stable orbit and scalp soft tissues. Stable and negative for age visualized cervical spine. Normal bone marrow signal. IMPRESSION: 1. Evidence of a tiny acute infarct in the superior left occipital lobe with no associated hemorrhage or mass effect. 2. A small chronic lacune in the right pons also is new since July 2016. 3. Otherwise stable MRI appearance of the brain suggesting chronic hypertensive and/or small vessel disease. 4. Chronic paranasal sinus disease with interval progression. Electronically Signed   By: Genevie Ann M.D.   On: 07/25/2015 13:40       Assessment and plan discussed with with attending physician and they are in agreement.    Etta Quill PA-C [facility] [phone]  07/25/2015, 3:44 PM   Assessment: 82 y.o. male with transient vertigo and dysequilibrium that subsequently resolved and infarct on MRI. My suspicion is that he had a posterior circulation embolus taht broke up and went distal to the point that we see on MRI. This is in the posterior watershed region, however, and one other possibility would be hypotension as a cause of his symptoms and infarct in the watershed area due to this.   Stroke Risk Factors - diabetes mellitus and hyperlipidemia  1. HgbA1c, fasting lipid panel 2. Frequent neuro checks 3. Echocardiogram 4. CTA head and neck to evaluate the posterior circulation 5. Prophylactic therapy-Antiplatelet med: Aspirin - dose 325mg  PO or 300mg  PR until study medication can be brought from home 6. Risk factor modification 7. Telemetry monitoring 8. PT  consult, OT consult, Speech consult 9. please page stroke NP  Or  PA  Or MD  M-F from 8am -4 pm starting 6/9 as this patient will be followed by  the stroke team at this point.   You can look them up on [email or web address]    Roland Rack, MD [facility] [phone]  If 7pm- 7am, please page neurology on call as listed in [place].

## 2015-07-25 NOTE — ED Notes (Signed)
Patient ambulated to restroom with steady gait. Denies dizziness.  

## 2015-07-25 NOTE — ED Notes (Signed)
Ct MADE AWARE PT READY FOR SCAN.

## 2015-07-25 NOTE — ED Notes (Signed)
Patient transported to MRI on stretcher with tech.

## 2015-07-25 NOTE — ED Notes (Signed)
CHECKED CBG 156, RN JULIA INFORMED

## 2015-07-25 NOTE — ED Notes (Signed)
Pt presents with sudden onset of dizziness that began at 0900 this morning.  Pt reports he awoke asymptomatic, ate breakfast with dizziness.  +nausea and vomiting multiple times; zofran 8mg  given IVP

## 2015-07-25 NOTE — ED Provider Notes (Signed)
CSN: [id number]     Arrival date & time 07/25/15  1031 History   First MD Initiated Contact with Patient 07/25/15 1054     Chief Complaint  Patient presents with  . Dizziness     (Consider location/radiation/quality/duration/timing/severity/associated sxs/prior Treatment) HPI Approximate 9 AM the patient reports he developed a dizziness. He reports it felt like his gait was very unsteady. He did not have focal extremity weakness or numbness. He reports however he needed assistance to walk due to being unsteady. There was some waxing and waning of severity of the symptoms. Patient denies associated headache. He did develop nausea and vomited a small amount several times. He denies any changes in his vision. He does report now the symptoms are somewhat improved. They're not particularly worse with position change of his head. He has not been ill recently. Patient at baseline is active and alert living independently with his wife. He denies a prior history of vertigo. Past Medical History  Diagnosis Date  . Diverticulosis   . Hiatal hernia   . Colitis   . Hemorrhoids   . Colon polyps   . Proctitis   . GERD (gastroesophageal reflux disease)   . Osteoarthritis   . Asthma   . COPD (chronic obstructive pulmonary disease) ([place])   . Status post dilation of esophageal narrowing   . HLD (hyperlipidemia)   . Stroke ([place])   . Diabetes mellitus type 2, diet-controlled ([place]) 12/22/2013  . History of esophageal strciture   . Dyslipidemia 06/24/2007   Past Surgical History  Procedure Laterality Date  . Nissen    . Splenectomy, total    . Esophageal hernia    . Abdominal hernia repair    . Ventral hernia repair  01/29/2012     WITH MESH  . Ventral hernia repair  01/29/2012    Procedure: HERNIA REPAIR VENTRAL ADULT;  Surgeon: Gwenyth Ober, MD;  Location: [place];  Service: General;  Laterality: N/A;  open recurrent ventral hernia repair with mesh  . Insertion of mesh  01/29/2012    Procedure:  INSERTION OF MESH;  Surgeon: Gwenyth Ober, MD;  Location: [place];  Service: General;  Laterality: N/A;   Family History  Problem Relation Age of Onset  . Cancer Father    Social History  Substance Use Topics  . Smoking status: Never Smoker   . Smokeless tobacco: Never Used  . Alcohol Use: 0.0 oz/week    0 Standard drinks or equivalent per week     Comment: 1 1/2 - 2 daily    Review of Systems  10 Systems reviewed and are negative for acute change except as noted in the HPI.   Allergies  Review of patient's allergies indicates no known allergies.  Home Medications   Prior to Admission medications   Medication Sig Start Date End Date Taking? Authorizing Provider  divalproex (DEPAKOTE ER) 500 MG 24 hr tablet Take 1 tablet (500 mg total) by mouth daily. 08/08/14  Yes Garvin Fila, MD  fluticasone (FLONASE) 50 MCG/ACT nasal spray Place 2 sprays into both nostrils daily. 06/18/14  Yes Historical Provider, MD  fluticasone-salmeterol (ADVAIR HFA) 115-21 MCG/ACT inhaler Inhale 2 puffs into the lungs 2 (two) times daily.   Yes Historical Provider, MD  Magnesium Gluconate 500 (27 MG) MG TABS Take 500 mg by mouth at bedtime.   Yes Historical Provider, MD  montelukast (SINGULAIR) 10 MG tablet Take 10 mg by mouth every evening. 06/18/14  Yes Historical Provider, MD  Multiple Vitamin (MULTIVITAMIN WITH MINERALS) TABS Take 1 tablet by mouth daily.   Yes Historical Provider, MD  simvastatin (ZOCOR) 20 MG tablet Take 20 mg by mouth daily.   Yes Historical Provider, MD  zolpidem (AMBIEN) 5 MG tablet Take 1 tablet (5 mg total) by mouth at bedtime as needed for sleep. 02/04/12  Yes Judeth Horn, MD   BP 159/90 mmHg  Pulse 64  Temp(Src) 97.6 F (36.4 C) (Oral)  Resp 19  Ht 5\' 11"  (1.803 m)  Wt 160 lb (72.576 kg)  BMI 22.33 kg/m2  SpO2 97% Physical Exam  Constitutional: He is oriented to person, place, and time. He appears well-developed and well-nourished. No distress.  HENT:  Head:  Normocephalic and atraumatic.  Right Ear: External ear normal.  Left Ear: External ear normal.  Nose: Nose normal.  Mouth/Throat: Oropharynx is clear and moist.  Bilateral TMs normal.  Eyes: EOM are normal. Pupils are equal, round, and reactive to light.  Neck: Neck supple.  Cardiovascular: Normal rate, regular rhythm, normal heart sounds and intact distal pulses.   Pulmonary/Chest: Effort normal and breath sounds normal.  Abdominal: Soft. Bowel sounds are normal. He exhibits no distension. There is no tenderness.  Musculoskeletal: Normal range of motion. He exhibits no edema.  Neurological: He is alert and oriented to person, place, and time. He has normal strength. No cranial nerve deficit. He exhibits normal muscle tone. Coordination normal. GCS eye subscore is 4. GCS verbal subscore is 5. GCS motor subscore is 6.  Normal heel shin examination. No pronator drift. Normal strength testing 4 extremities. No sensory deficit to light touch on face or extremities. Patient has normal cognitive function. Speech is not slurred.  Skin: Skin is warm, dry and intact.  Psychiatric: He has a normal mood and affect.    ED Course  Procedures (including critical care time) Labs Review Labs Reviewed  BASIC METABOLIC PANEL - Abnormal; Notable for the following:    Glucose, Bld 170 (*)    Calcium 8.6 (*)    All other components within normal limits  URINALYSIS, ROUTINE W REFLEX MICROSCOPIC (NOT AT [facility]) - Abnormal; Notable for the following:    APPearance CLOUDY (*)    All other components within normal limits  CBG MONITORING, ED - Abnormal; Notable for the following:    Glucose-Capillary 156 (*)    All other components within normal limits  CBC  VALPROIC ACID LEVEL    Imaging Review Mr Brain Wo Contrast (neuro Protocol)  07/25/2015  CLINICAL DATA:  82 year old male who awoke with severe dizziness and lower extremity weakness today. Initial encounter. EXAM: MRI HEAD WITHOUT CONTRAST TECHNIQUE:  Multiplanar, multiecho pulse sequences of the brain and surrounding structures were obtained without intravenous contrast. COMPARISON:  Brain MRI 08/23/2014 and earlier. FINDINGS: Major intracranial vascular flow voids are stable. Small 4 mm area of cortical restricted diffusion suspected in the superior left occipital lobe on series 4, image 29. Diffusion elsewhere appears within normal limits. No associated mass effect, hemorrhage, or definite T2 signal abnormality. No midline shift, mass effect, evidence of mass lesion, ventriculomegaly, extra-axial collection or acute intracranial hemorrhage. Cervicomedullary junction and pituitary are within normal limits. Aside from the small acute finding gray and white matter signal in the brain is stable since 2016 including scattered moderate for age white matter T2 and FLAIR hyperintensity. Small chronic micro hemorrhage in the right thalamus is stable. Chronic widespread T2 heterogeneity in the deep gray matter again noted and in part due to increased  perivascular spaces. There might be a tiny chronic lacune in the right pons on series 6, image 9 which was not evident previously. The cerebellum is stable and within normal limits. Visible internal auditory structures appear normal. Chronic paranasal sinus disease with worsening opacification of the frontal and ethmoid sinuses from the prior study. Susceptibility artifact today about the tube right maxilla of unclear significance. Stable orbit and scalp soft tissues. Stable and negative for age visualized cervical spine. Normal bone marrow signal. IMPRESSION: 1. Evidence of a tiny acute infarct in the superior left occipital lobe with no associated hemorrhage or mass effect. 2. A small chronic lacune in the right pons also is new since July 2016. 3. Otherwise stable MRI appearance of the brain suggesting chronic hypertensive and/or small vessel disease. 4. Chronic paranasal sinus disease with interval progression.  Electronically Signed   By: Genevie Ann M.D.   On: 07/25/2015 13:40   I have personally reviewed and evaluated these images and lab results as part of my medical decision-making.   EKG Interpretation   Date/Time:  Thursday July 25 2015 10:44:46 EDT Ventricular Rate:  65 PR Interval:  210 QRS Duration: 99 QT Interval:  478 QTC Calculation: 497 R Axis:   -35 Text Interpretation:  Sinus rhythm Ventricular premature complex Consider  left atrial enlargement Left axis deviation Borderline prolonged QT  interval Confirmed by Johnney Killian, MD, Jeannie Done [phone]) on 07/25/2015 2:24:02 PM     Consult: Dr. Leonel Ramsay of neurology consults it for acute CVA on MRI. MDM   Final diagnoses:  Acute CVA (cerebrovascular accident) [facility])    Patient is well. He developed acute onset of dizziness and imbalance today. Neurologic examination is intact and normal. MRI does show small area of acute infarct in the occipital region. As as reviewed with Dr. Leonel Ramsay and at this time the patient will be admitted for ongoing management of acute CVA. Aspirin ordered. Hospitalist consult for admission.    Charlesetta Shanks, MD 07/25/15 1426

## 2015-07-25 NOTE — ED Notes (Signed)
Admitting at bedside 

## 2015-07-25 NOTE — Plan of Care (Signed)
I called pt and talked with him over the phone. I told him that he had a tiny new stroke at left occipital region and it seems that he did have a silent stroke at right pons sometime between 08/2014 and now. We will discontinue his investigational drugs. He is on ASA now and Dr. Jaynee Eagles will see him tomorrow to develop treatment plan. He expressed understanding.   [name], MD PhD Stroke Neurology 07/25/2015 9:51 PM

## 2015-07-25 NOTE — ED Notes (Signed)
Pt is aware we need a urine sample.

## 2015-07-25 NOTE — H&P (Signed)
History and Physical    [name] [id number] DOB: February 07, 1932 DOA: 07/25/2015  PCP: Dorian Heckle, MD Patient coming from: home   Chief Complaint: Dizziness  HPI: [name] is a 82 y.o. male with medical history significant of diverticulosis, GERD, arthritis, asthma, COPD, HLD, CVA, DM, esophageal stretcher status post dilatation presenting after acute onset episode of dizziness. Patient with history of CVA in the past. Per report previous stroke affected his speech which patient states he is able to get 95% of his speaking ability back. Patient reports waking up this morning and is normal state of health. However, shortly after breakfast patient was in his home when he noticed onset of dizziness. This came on over approximately 5 minute period of time. Patient describes this as a feeling of unsteadiness on his feet. Denies actual vertigo, headache, neck stiffness, palpitations, chest pain, shortness of breath, fever. Patient denies recent dysuria frequency rash or back pain. States that this is associated with a feeling of nausea. Multiple bouts of emesis. Patient received 2 shots on route by EMS. States that the medicines that he was given helped with his symptoms and was not sure what he was received. Patient states he is currently symptom-free.    ED Course: Upon arrival in the ED patient received 325 mg of aspirin, stroke team was called, MRI reveals small stroke.  Review of Systems: As per HPI otherwise 10 point review of systems negative.   Ambulatory Status: No restrictions  Past Medical History  Diagnosis Date  . Diverticulosis   . Hiatal hernia   . Colitis   . Hemorrhoids   . Colon polyps   . Proctitis   . GERD (gastroesophageal reflux disease)   . Osteoarthritis   . Asthma   . COPD (chronic obstructive pulmonary disease) ([place])   . Status post dilation of esophageal narrowing   . HLD (hyperlipidemia)   . Stroke ([place])   . Diabetes mellitus type 2,  diet-controlled ([place]) 12/22/2013  . History of esophageal strciture   . Dyslipidemia 06/24/2007  . Seizure [facility])     Past Surgical History  Procedure Laterality Date  . Nissen    . Splenectomy, total    . Esophageal hernia    . Abdominal hernia repair    . Ventral hernia repair  01/29/2012     WITH MESH  . Ventral hernia repair  01/29/2012    Procedure: HERNIA REPAIR VENTRAL ADULT;  Surgeon: Gwenyth Ober, MD;  Location: [place];  Service: General;  Laterality: N/A;  open recurrent ventral hernia repair with mesh  . Insertion of mesh  01/29/2012    Procedure: INSERTION OF MESH;  Surgeon: Gwenyth Ober, MD;  Location: [place];  Service: General;  Laterality: N/A;    Social History   Social History  . Marital Status: Married    Spouse Name: [name]  . Number of Children: 2  . Years of Education: Bachelor's   Occupational History  . retired    Social History Main Topics  . Smoking status: Never Smoker   . Smokeless tobacco: Never Used  . Alcohol Use: 0.0 oz/week    0 Standard drinks or equivalent per week     Comment: 1 1/2 - 2 daily  . Drug Use: No  . Sexual Activity: Not on file   Other Topics Concern  . Not on file   Social History Narrative   Patient is married and has 2 children.   Patient is right handed.  Patient has Bachelor's degree.   Patient drinks 2 cups daily.    No Known Allergies  Family History  Problem Relation Age of Onset  . Cancer Father     Prior to Admission medications   Medication Sig Start Date End Date Taking? Authorizing Provider  divalproex (DEPAKOTE ER) 500 MG 24 hr tablet Take 1 tablet (500 mg total) by mouth daily. 08/08/14  Yes Garvin Fila, MD  fluticasone (FLONASE) 50 MCG/ACT nasal spray Place 2 sprays into both nostrils daily. 06/18/14  Yes Historical Provider, MD  fluticasone-salmeterol (ADVAIR HFA) 115-21 MCG/ACT inhaler Inhale 2 puffs into the lungs 2 (two) times daily.   Yes Historical Provider, MD  Magnesium Gluconate 500 (27  MG) MG TABS Take 500 mg by mouth at bedtime.   Yes Historical Provider, MD  montelukast (SINGULAIR) 10 MG tablet Take 10 mg by mouth every evening. 06/18/14  Yes Historical Provider, MD  Multiple Vitamin (MULTIVITAMIN WITH MINERALS) TABS Take 1 tablet by mouth daily.   Yes Historical Provider, MD  simvastatin (ZOCOR) 20 MG tablet Take 20 mg by mouth daily.   Yes Historical Provider, MD  zolpidem (AMBIEN) 5 MG tablet Take 1 tablet (5 mg total) by mouth at bedtime as needed for sleep. 02/04/12  Yes Judeth Horn, MD    Physical Exam: Filed Vitals:   07/25/15 1402 07/25/15 1445 07/25/15 1515 07/25/15 1600  BP: 159/90 162/99 146/102 131/77  Pulse: 64 85 72 76  Temp:      TempSrc:      Resp: 19 15 22 18   Height:      Weight:      SpO2: 97% 95% 97% 96%     General:  Appears calm and comfortable Eyes:  PERRL, EOMI, normal lids, iris ENT:  grossly normal hearing, lips & tongue, mmm Neck:  no LAD, masses or thyromegaly Cardiovascular:  RRR, no m/r/g. No LE edema.  Respiratory:  CTA bilaterally, no w/r/r. Normal respiratory effort. Abdomen:  soft, ntnd, NABS Skin:  no rash or induration seen on limited exam Musculoskeletal:  grossly normal tone BUE/BLE, good ROM, no bony abnormality Psychiatric:  grossly normal mood and affect, speech fluent and appropriate, AOx3 Neurologic:  CN 2-12 grossly intact, moves all extremities in coordinated fashion, sensation intact, no dysmetria, sit unassisted.  Labs on Admission: I have personally reviewed following labs and imaging studies  CBC: No results for input(s): WBC, NEUTROABS, HGB, HCT, MCV, PLT in the last 168 hours. Basic Metabolic Panel:  Recent Labs Lab 07/25/15 1051  NA 138  K 3.7  CL 104  CO2 28  GLUCOSE 170*  BUN 13  CREATININE 0.77  CALCIUM 8.6*   GFR: Estimated Creatinine Clearance: 74.4 mL/min (by C-G formula based on Cr of 0.77). Liver Function Tests: No results for input(s): AST, ALT, ALKPHOS, BILITOT, PROT, ALBUMIN in the  last 168 hours. No results for input(s): LIPASE, AMYLASE in the last 168 hours. No results for input(s): AMMONIA in the last 168 hours. Coagulation Profile: No results for input(s): INR, PROTIME in the last 168 hours. Cardiac Enzymes: No results for input(s): CKTOTAL, CKMB, CKMBINDEX, TROPONINI in the last 168 hours. BNP (last 3 results) No results for input(s): PROBNP in the last 8760 hours. HbA1C: No results for input(s): HGBA1C in the last 72 hours. CBG:  Recent Labs Lab 07/25/15 1104  GLUCAP 156*   Lipid Profile: No results for input(s): CHOL, HDL, LDLCALC, TRIG, CHOLHDL, LDLDIRECT in the last 72 hours. Thyroid Function Tests: No results for  input(s): TSH, T4TOTAL, FREET4, T3FREE, THYROIDAB in the last 72 hours. Anemia Panel: No results for input(s): VITAMINB12, FOLATE, FERRITIN, TIBC, IRON, RETICCTPCT in the last 72 hours. Urine analysis:    Component Value Date/Time   COLORURINE YELLOW 07/25/2015 1200   APPEARANCEUR CLOUDY* 07/25/2015 1200   LABSPEC 1.015 07/25/2015 1200   PHURINE 7.0 07/25/2015 1200   GLUCOSEU NEGATIVE 07/25/2015 1200   HGBUR NEGATIVE 07/25/2015 1200   BILIRUBINUR NEGATIVE 07/25/2015 1200   KETONESUR NEGATIVE 07/25/2015 1200   PROTEINUR NEGATIVE 07/25/2015 1200   UROBILINOGEN 0.2 12/22/2013 1210   NITRITE NEGATIVE 07/25/2015 1200   LEUKOCYTESUR NEGATIVE 07/25/2015 1200    Creatinine Clearance: Estimated Creatinine Clearance: 74.4 mL/min (by C-G formula based on Cr of 0.77).  Sepsis Labs: @LABRCNTIP (procalcitonin:4,lacticidven:4) )No results found for this or any previous visit (from the past 240 hour(s)).   Radiological Exams on Admission: Mr Brain Wo Contrast (neuro Protocol)  07/25/2015  CLINICAL DATA:  82 year old male who awoke with severe dizziness and lower extremity weakness today. Initial encounter. EXAM: MRI HEAD WITHOUT CONTRAST TECHNIQUE: Multiplanar, multiecho pulse sequences of the brain and surrounding structures were obtained  without intravenous contrast. COMPARISON:  Brain MRI 08/23/2014 and earlier. FINDINGS: Major intracranial vascular flow voids are stable. Small 4 mm area of cortical restricted diffusion suspected in the superior left occipital lobe on series 4, image 29. Diffusion elsewhere appears within normal limits. No associated mass effect, hemorrhage, or definite T2 signal abnormality. No midline shift, mass effect, evidence of mass lesion, ventriculomegaly, extra-axial collection or acute intracranial hemorrhage. Cervicomedullary junction and pituitary are within normal limits. Aside from the small acute finding gray and white matter signal in the brain is stable since 2016 including scattered moderate for age white matter T2 and FLAIR hyperintensity. Small chronic micro hemorrhage in the right thalamus is stable. Chronic widespread T2 heterogeneity in the deep gray matter again noted and in part due to increased perivascular spaces. There might be a tiny chronic lacune in the right pons on series 6, image 9 which was not evident previously. The cerebellum is stable and within normal limits. Visible internal auditory structures appear normal. Chronic paranasal sinus disease with worsening opacification of the frontal and ethmoid sinuses from the prior study. Susceptibility artifact today about the tube right maxilla of unclear significance. Stable orbit and scalp soft tissues. Stable and negative for age visualized cervical spine. Normal bone marrow signal. IMPRESSION: 1. Evidence of a tiny acute infarct in the superior left occipital lobe with no associated hemorrhage or mass effect. 2. A small chronic lacune in the right pons also is new since July 2016. 3. Otherwise stable MRI appearance of the brain suggesting chronic hypertensive and/or small vessel disease. 4. Chronic paranasal sinus disease with interval progression. Electronically Signed   By: Genevie Ann M.D.   On: 07/25/2015 13:40     Assessment/Plan Active  Problems:   Diabetes mellitus type 2, diet-controlled (HCC)   Seizures (H[facility])   HLD (hyperlipidemia)   Acute CVA (cerebrovascular accident) ([place])   Insomnia   COPD bronchitis   Stroke: MRI showing tiny infarct in the superior left occipital lobe without evidence of hemorrhage. This is patient's second reported stroke in his lifetime. Patient currently is [facility] by [facility] neurology and is in the RESPECTESUS trial. Outside the window for treatment though currently asymptomatic. - Telemetry - CTA head and neck - Echo - Restrict stratification labs - Follow-up neuro recommendations - Neuro checks, PT OT - Allow permissive hypertension - ASA  COPD:  stable/baseline. No O2  - Continue singulair, advair  DM: last A1c in 2015 showing 6.7. Diet controlled - A1c - SSI  HLD: - continue statin  Seizures: no recently reported seizures - continue depakote - depakote level  Insomnia: - continue ambien   DVT prophylaxis: Lovenox  Code Status: FULL  Family Communication: brother and wife  Disposition Plan: pending workup  Consults called: neuro  Admission status: Inpt    Leinaala Catanese J MD [facility]  If 7PM-7AM, please contact night-coverage [email or web address] Password [email or web address]  07/25/2015, 5:09 PM

## 2015-07-25 NOTE — ED Notes (Signed)
Pt ambulated to bathroom with assistance and back to room.  Pt denies any dizziness; gait steady - pt tolerated well.

## 2015-07-26 ENCOUNTER — Observation Stay (HOSPITAL_BASED_OUTPATIENT_CLINIC_OR_DEPARTMENT_OTHER): Payer: Medicare HMO

## 2015-07-26 DIAGNOSIS — I6789 Other cerebrovascular disease: Secondary | ICD-10-CM

## 2015-07-26 DIAGNOSIS — E118 Type 2 diabetes mellitus with unspecified complications: Secondary | ICD-10-CM | POA: Diagnosis not present

## 2015-07-26 DIAGNOSIS — R569 Unspecified convulsions: Secondary | ICD-10-CM | POA: Diagnosis not present

## 2015-07-26 DIAGNOSIS — E785 Hyperlipidemia, unspecified: Secondary | ICD-10-CM | POA: Diagnosis not present

## 2015-07-26 DIAGNOSIS — I639 Cerebral infarction, unspecified: Secondary | ICD-10-CM | POA: Diagnosis not present

## 2015-07-26 DIAGNOSIS — J449 Chronic obstructive pulmonary disease, unspecified: Secondary | ICD-10-CM | POA: Diagnosis not present

## 2015-07-26 LAB — ECHOCARDIOGRAM COMPLETE
E/e' ratio: 8.39
EWDT: 229 ms
FS: 27 % — AB (ref 28–44)
Height: 71 in
IV/PV OW: 1.05
LA vol: 44.6 mL
LADIAMINDEX: 1.09 cm/m2
LASIZE: 21 mm
LAVOLA4C: 41.7 mL
LAVOLIN: 23.2 mL/m2
LEFT ATRIUM END SYS DIAM: 21 mm
LV TDI E'MEDIAL: 6.64
LVDIAVOL: 103 mL (ref 62–150)
LVDIAVOLIN: 54 mL/m2
LVEEAVG: 8.39
LVEEMED: 8.39
LVELAT: 8.27 cm/s
LVOT area: 3.46 cm2
LVOTD: 21 mm
MV Dec: 229
MV pk A vel: 87.3 m/s
MVPKEVEL: 69.4 m/s
PW: 8.92 mm — AB (ref 0.6–1.1)
TDI e' lateral: 8.27
Weight: 2598.4 oz

## 2015-07-26 LAB — HEMOGLOBIN A1C
HEMOGLOBIN A1C: 6.5 % — AB (ref 4.8–5.6)
MEAN PLASMA GLUCOSE: 140 mg/dL

## 2015-07-26 LAB — CBC
HCT: 47.3 % (ref 39.0–52.0)
HEMOGLOBIN: 15.4 g/dL (ref 13.0–17.0)
MCH: 30.8 pg (ref 26.0–34.0)
MCHC: 32.6 g/dL (ref 30.0–36.0)
MCV: 94.6 fL (ref 78.0–100.0)
PLATELETS: 302 10*3/uL (ref 150–400)
RBC: 5 MIL/uL (ref 4.22–5.81)
RDW: 14.8 % (ref 11.5–15.5)
WBC: 16.5 10*3/uL — ABNORMAL HIGH (ref 4.0–10.5)

## 2015-07-26 LAB — BASIC METABOLIC PANEL
Anion gap: 8 (ref 5–15)
BUN: 11 mg/dL (ref 6–20)
CO2: 26 mmol/L (ref 22–32)
CREATININE: 0.89 mg/dL (ref 0.61–1.24)
Calcium: 8.7 mg/dL — ABNORMAL LOW (ref 8.9–10.3)
Chloride: 105 mmol/L (ref 101–111)
Glucose, Bld: 131 mg/dL — ABNORMAL HIGH (ref 65–99)
POTASSIUM: 3.9 mmol/L (ref 3.5–5.1)
SODIUM: 139 mmol/L (ref 135–145)

## 2015-07-26 LAB — GLUCOSE, CAPILLARY
GLUCOSE-CAPILLARY: 154 mg/dL — AB (ref 65–99)
GLUCOSE-CAPILLARY: 82 mg/dL (ref 65–99)

## 2015-07-26 MED ORDER — CLOPIDOGREL BISULFATE 75 MG PO TABS: 75.0000 mg | ORAL_TABLET | Freq: Every day | ORAL | Status: DC

## 2015-07-26 MED ORDER — CLOPIDOGREL BISULFATE 75 MG PO TABS
75.0000 mg | ORAL_TABLET | Freq: Every day | ORAL | Status: DC
  Administered 2015-07-26: 75 mg via ORAL
  Filled 2015-07-26: qty 1

## 2015-07-26 NOTE — Progress Notes (Signed)
Pt discharging home at this time with wife taking all personal belongings. IV discontinued, dry dressing applied. Discharge instructions provided with prescriptions with verbal understanding. Pt aware of follow up appts. No noted distress.

## 2015-07-26 NOTE — Progress Notes (Signed)
Pt noted ambulating in the hall way with therapy. Pt standby assist, gait steady. Denies pain or discomfort. Will continue to monitor.

## 2015-07-26 NOTE — Discharge Summary (Signed)
FREDRIC PRIMAS, is a 82 y.o. male  DOB [birth year]  MRN [id number].  Admission date:  07/25/2015  Admitting Physician  Waldemar Dickens, MD  Discharge Date:  07/26/2015   Primary MD  Dorian Heckle, MD  Recommendations for primary care physician for things to follow:  - Patient to follow with neurology as an outpatient - Patient to have TEE and loop recorder as an outpatient, discussed with cardiology coordinator who will arrange for this as an outpatient  Admission Diagnosis  Stroke (cerebrum) ([place]) [I63.9] Acute CVA (cerebrovascular accident) [facility]) [I63.9]   Discharge Diagnosis  Stroke (cerebrum) ([place]) [I63.9] Acute CVA (cerebrovascular accident) ([place]) [I63.9]    Active Problems:   Diabetes mellitus type 2, diet-controlled ([place])   Seizures ([place])   HLD (hyperlipidemia)   Acute CVA (cerebrovascular accident) ([place])   Insomnia   COPD bronchitis      Past Medical History  Diagnosis Date  . Diverticulosis   . Hiatal hernia   . Colitis   . Hemorrhoids   . Colon polyps   . Proctitis   . GERD (gastroesophageal reflux disease)   . Osteoarthritis   . Asthma   . COPD (chronic obstructive pulmonary disease) ([place])   . Status post dilation of esophageal narrowing   . HLD (hyperlipidemia)   . Stroke ([place])   . Diabetes mellitus type 2, diet-controlled ([place]) 12/22/2013  . History of esophageal strciture   . Dyslipidemia 06/24/2007  . Seizure [facility])     Past Surgical History  Procedure Laterality Date  . Nissen    . Splenectomy, total    . Esophageal hernia    . Abdominal hernia repair    . Ventral hernia repair  01/29/2012     WITH MESH  . Ventral hernia repair  01/29/2012    Procedure: HERNIA REPAIR VENTRAL ADULT;  Surgeon: Gwenyth Ober, MD;  Location: [place];  Service: General;  Laterality: N/A;  open recurrent ventral hernia repair with mesh  . Insertion of mesh  01/29/2012    Procedure:  INSERTION OF MESH;  Surgeon: Gwenyth Ober, MD;  Location: [place];  Service: General;  Laterality: N/A;       History of present illness and  Hospital Course:     Kindly see H&P for history of present illness and admission details, please review complete Labs, Consult reports and Test reports for all details in brief  HPI  from the history and physical done on the day of admission 07/25/2015 HPI: TEL CATTON is a 82 y.o. male with medical history significant of diverticulosis, GERD, arthritis, asthma, COPD, HLD, CVA, DM, esophageal stretcher status post dilatation presenting after acute onset episode of dizziness. Patient with history of CVA in the past. Per report previous stroke affected his speech which patient states he is able to get 95% of his speaking ability back. Patient reports waking up this morning and is normal state of health. However, shortly after breakfast patient was in his home when he noticed onset of dizziness. This  came on over approximately 5 minute period of time. Patient describes this as a feeling of unsteadiness on his feet. Denies actual vertigo, headache, neck stiffness, palpitations, chest pain, shortness of breath, fever. Patient denies recent dysuria frequency rash or back pain. States that this is associated with a feeling of nausea. Multiple bouts of emesis. Patient received 2 shots on route by EMS. States that the medicines that he was given helped with his symptoms and was not sure what he was received. Patient states he is currently symptom-free.   Hospital Course  82 y.o. male with history of diverticulosis, GERD, arthritis, asthma, COPD, HLD, CVA, DM, esophageal stretcher after acute onset episode of dizziness, Workup significant for CVA, admitted for further workup  Acute CVA - MRI brain :Tiny acute infarct in the superior left occipital lobe - CTA head and neck:No extracranial or intracranial stenosis of significance - 2-D echo done with no evidence of  embolic stroke, EF [id number] - Hemoglobin A1c is 6.5 - LDL pending time of discharge, but he is oriented on statin - Dolg[place] consult appreciated, discussed with neurology, recommendation is for embolic source workup, but patient does not want to wait the weekend in the hospital for loop recorder and TEE early next week, he is adamant about going home today, so the workup can be arranged as an outpatient, as well neurology discussed with patient want to defer for anticoagulation pending further workup, he will be discharged on Plavix 75 mg oral daily as discussed with the neurology. - He was on investigational drug on a study (currently discontinued at time of discharge).  COPD:  - stable/baseline. No O2  - Continue singulair, advair  DM:  - last A1c in 2015 showing 6.7. Diet controlled - A myoglobin A1c 6.5 -  Leukocytosis - Afebrile, negative urinalysis,   HLD: - continue statin  Seizures:  - continue depakote    Discharge Condition:  stable   Follow UP  Follow-up Information    Follow up with Dorian Heckle, MD.   Specialty:  Internal Medicine   Contact information:   [address] [place] [address] [phone]       Follow up with Ezzard Standing, MD.   Specialty:  Cardiology   Contact information:   [address] [place] [address] [phone]       Follow up with Xu,Jindong, MD.   Specialty:  Neurology   Contact information:   [address] [place] [address] [phone]         Discharge Instructions  and  Discharge Medications     Discharge Instructions    Diet - low sodium heart healthy    Complete by:  As directed      Discharge instructions    Complete by:  As directed   Follow with Primary MD Dorian Heckle, MD in 7 days   Get CBC, CMP,  checked  by Primary MD next visit.    Activity: As tolerated with Full fall precautions use walker/cane & assistance as  needed   Disposition Home    Diet: Heart Healthy , with feeding assistance and aspiration precautions.  For Heart failure patients - Check your Weight same time everyday, if you gain over 2 pounds, or you develop in leg swelling, experience more shortness of breath or chest pain, call your Primary MD immediately. Follow Cardiac Low Salt Diet and 1.5 lit/day fluid restriction.   On your next visit with your primary  care physician please Get Medicines reviewed and adjusted.   Please request your Prim.MD to go over all Hospital Tests and Procedure/Radiological results at the follow up, please get all Hospital records sent to your Prim MD by signing hospital release before you go home.   If you experience worsening of your admission symptoms, develop shortness of breath, life threatening emergency, suicidal or homicidal thoughts you must seek medical attention immediately by calling 911 or calling your MD immediately  if symptoms less severe.  You Must read complete instructions/literature along with all the possible adverse reactions/side effects for all the Medicines you take and that have been prescribed to you. Take any new Medicines after you have completely understood and accpet all the possible adverse reactions/side effects.   Do not drive, operating heavy machinery, perform activities at heights, swimming or participation in water activities or provide baby sitting services if your were admitted for syncope or siezures until you have seen by Primary MD or a Neurologist and advised to do so again.  Do not drive when taking Pain medications.    Do not take more than prescribed Pain, Sleep and Anxiety Medications  Special Instructions: If you have smoked or chewed Tobacco  in the last 2 yrs please stop smoking, stop any regular Alcohol  and or any Recreational drug use.  Wear Seat belts while driving.   Please note  You were cared for by a hospitalist during your hospital stay.  If you have any questions about your discharge medications or the care you received while you were in the hospital after you are discharged, you can call the unit and asked to speak with the hospitalist on call if the hospitalist that took care of you is not available. Once you are discharged, your primary care physician will handle any further medical issues. Please note that NO REFILLS for any discharge medications will be authorized once you are discharged, as it is imperative that you return to your primary care physician (or establish a relationship with a primary care physician if you do not have one) for your aftercare needs so that they can reassess your need for medications and monitor your lab values.     Increase activity slowly    Complete by:  As directed             Medication List    TAKE these medications        clopidogrel 75 MG tablet  Commonly known as:  PLAVIX  Take 1 tablet (75 mg total) by mouth daily.     divalproex 500 MG 24 hr tablet  Commonly known as:  DEPAKOTE ER  Take 1 tablet (500 mg total) by mouth daily.     fluticasone 50 MCG/ACT nasal spray  Commonly known as:  FLONASE  Place 2 sprays into both nostrils daily.     fluticasone-salmeterol 115-21 MCG/ACT inhaler  Commonly known as:  ADVAIR HFA  Inhale 2 puffs into the lungs 2 (two) times daily.     Magnesium Gluconate 500 (27 Mg) MG Tabs  Take 500 mg by mouth at bedtime.     montelukast 10 MG tablet  Commonly known as:  SINGULAIR  Take 10 mg by mouth every evening.     multivitamin with minerals Tabs tablet  Take 1 tablet by mouth daily.     simvastatin 20 MG tablet  Commonly known as:  ZOCOR  Take 20 mg by mouth daily.     zolpidem 5 MG tablet  Commonly known as:  AMBIEN  Take 1 tablet (5 mg total) by mouth at bedtime as needed for sleep.          Diet and Activity recommendation: See Discharge Instructions above   Consults obtained -  Neurology   Major procedures and Radiology  Reports - PLEASE review detailed and final reports for all details, in brief -      Ct Angio Head W/cm &/or Wo Cm  07/25/2015  CLINICAL DATA:  82 year old male presenting with vertigo and disequilibrium earlier today. No focal weakness. Small acute infarct noted in the LEFT occipital lobe. Suspected posterior circulation embolus. EXAM: CT ANGIOGRAPHY HEAD AND NECK TECHNIQUE: Multidetector CT imaging of the head and neck was performed using the standard protocol during bolus administration of intravenous contrast. Multiplanar CT image reconstructions and MIPs were obtained to evaluate the vascular anatomy. Carotid stenosis measurements (when applicable) are obtained utilizing NASCET criteria, using the distal internal carotid diameter as the denominator. CONTRAST:  Isovue 370, 50 mL. COMPARISON:  MR brain earlier today. Multiple prior MR exams dating as far back as 2008. FINDINGS: CT HEAD Calvarium and skull base: No fracture or destructive lesion. Mastoids and middle ears are grossly clear. Paranasal sinuses: No layering fluid. Chronic sinus disease in the BILATERAL maxillary, ethmoid, and frontal sinuses, with layering fluid in the LEFT sphenoid. Orbits: Negative. Brain: No evidence of acute hemorrhage, hydrocephalus, or mass lesion. The smaller acute infarction noted on MR is not visible. Atrophy with chronic microvascular ischemic change. CTA NECK Aortic arch: Standard branching. Imaged portion shows no evidence of aneurysm or dissection. No significant stenosis of the major arch vessel origins. Atheromatous change along the transverse arch and great vessel origins is non stenotic. Right carotid system: Minor atheromatous change. No evidence of dissection, stenosis (50% or greater) or occlusion. Left carotid system: Minor atheromatous change. No evidence of dissection, stenosis (50% or greater) or occlusion. Vertebral arteries: Codominant. No evidence of dissection, stenosis (50% or greater) or occlusion.  Nonvascular soft tissues: Lung apices clear. No masses or lymphadenopathy. Spondylosis. CTA HEAD Anterior circulation: No significant skullbase or supraclinoid atheromatous change. No significant stenosis, proximal occlusion, aneurysm, or vascular malformation. Posterior circulation: Unremarkable. Balanced codominant vertebrals. No significant stenosis, proximal occlusion, aneurysm, or vascular malformation. Venous sinuses: As permitted by contrast timing, patent. Anatomic variants: None of significance. Delayed phase:   No abnormal intracranial enhancement. IMPRESSION: No extracranial or intracranial stenosis of significance. The small acute LEFT occipital infarct noted on today's MR is not visible on CT. No hemorrhage is evident. Chronic and acute sinusitis. Electronically Signed   By: Staci Righter M.D.   On: 07/25/2015 21:14   Ct Angio Neck W/cm &/or Wo/cm  07/25/2015  CLINICAL DATA:  82 year old male presenting with vertigo and disequilibrium earlier today. No focal weakness. Small acute infarct noted in the LEFT occipital lobe. Suspected posterior circulation embolus. EXAM: CT ANGIOGRAPHY HEAD AND NECK TECHNIQUE: Multidetector CT imaging of the head and neck was performed using the standard protocol during bolus administration of intravenous contrast. Multiplanar CT image reconstructions and MIPs were obtained to evaluate the vascular anatomy. Carotid stenosis measurements (when applicable) are obtained utilizing NASCET criteria, using the distal internal carotid diameter as the denominator. CONTRAST:  Isovue 370, 50 mL. COMPARISON:  MR brain earlier today. Multiple prior MR exams dating as far back as 2008. FINDINGS: CT HEAD Calvarium and skull base: No fracture or destructive lesion. Mastoids and middle ears are grossly clear. Paranasal sinuses: No layering fluid. Chronic sinus  disease in the BILATERAL maxillary, ethmoid, and frontal sinuses, with layering fluid in the LEFT sphenoid. Orbits: Negative.  Brain: No evidence of acute hemorrhage, hydrocephalus, or mass lesion. The smaller acute infarction noted on MR is not visible. Atrophy with chronic microvascular ischemic change. CTA NECK Aortic arch: Standard branching. Imaged portion shows no evidence of aneurysm or dissection. No significant stenosis of the major arch vessel origins. Atheromatous change along the transverse arch and great vessel origins is non stenotic. Right carotid system: Minor atheromatous change. No evidence of dissection, stenosis (50% or greater) or occlusion. Left carotid system: Minor atheromatous change. No evidence of dissection, stenosis (50% or greater) or occlusion. Vertebral arteries: Codominant. No evidence of dissection, stenosis (50% or greater) or occlusion. Nonvascular soft tissues: Lung apices clear. No masses or lymphadenopathy. Spondylosis. CTA HEAD Anterior circulation: No significant skullbase or supraclinoid atheromatous change. No significant stenosis, proximal occlusion, aneurysm, or vascular malformation. Posterior circulation: Unremarkable. Balanced codominant vertebrals. No significant stenosis, proximal occlusion, aneurysm, or vascular malformation. Venous sinuses: As permitted by contrast timing, patent. Anatomic variants: None of significance. Delayed phase:   No abnormal intracranial enhancement. IMPRESSION: No extracranial or intracranial stenosis of significance. The small acute LEFT occipital infarct noted on today's MR is not visible on CT. No hemorrhage is evident. Chronic and acute sinusitis. Electronically Signed   By: Staci Righter M.D.   On: 07/25/2015 21:14   Mr Brain Wo Contrast (neuro Protocol)  07/25/2015  CLINICAL DATA:  82 year old male who awoke with severe dizziness and lower extremity weakness today. Initial encounter. EXAM: MRI HEAD WITHOUT CONTRAST TECHNIQUE: Multiplanar, multiecho pulse sequences of the brain and surrounding structures were obtained without intravenous contrast.  COMPARISON:  Brain MRI 08/23/2014 and earlier. FINDINGS: Major intracranial vascular flow voids are stable. Small 4 mm area of cortical restricted diffusion suspected in the superior left occipital lobe on series 4, image 29. Diffusion elsewhere appears within normal limits. No associated mass effect, hemorrhage, or definite T2 signal abnormality. No midline shift, mass effect, evidence of mass lesion, ventriculomegaly, extra-axial collection or acute intracranial hemorrhage. Cervicomedullary junction and pituitary are within normal limits. Aside from the small acute finding gray and white matter signal in the brain is stable since 2016 including scattered moderate for age white matter T2 and FLAIR hyperintensity. Small chronic micro hemorrhage in the right thalamus is stable. Chronic widespread T2 heterogeneity in the deep gray matter again noted and in part due to increased perivascular spaces. There might be a tiny chronic lacune in the right pons on series 6, image 9 which was not evident previously. The cerebellum is stable and within normal limits. Visible internal auditory structures appear normal. Chronic paranasal sinus disease with worsening opacification of the frontal and ethmoid sinuses from the prior study. Susceptibility artifact today about the tube right maxilla of unclear significance. Stable orbit and scalp soft tissues. Stable and negative for age visualized cervical spine. Normal bone marrow signal. IMPRESSION: 1. Evidence of a tiny acute infarct in the superior left occipital lobe with no associated hemorrhage or mass effect. 2. A small chronic lacune in the right pons also is new since July 2016. 3. Otherwise stable MRI appearance of the brain suggesting chronic hypertensive and/or small vessel disease. 4. Chronic paranasal sinus disease with interval progression. Electronically Signed   By: Genevie Ann M.D.   On: 07/25/2015 13:40    Micro Results    No results found for this or any previous  visit (from the past 240 hour(s)).  Today   Subjective:   [place] today has no headache,no chest abdominal pain,no new weakness tingling or numbness, feels much better wants to go home today.   Objective:   Blood pressure 139/76, pulse 84, temperature 98.2 F (36.8 C), temperature source Oral, resp. rate 18, height 5\' 11"  (1.803 m), weight 73.664 kg (162 lb 6.4 oz), SpO2 97 %.   Intake/Output Summary (Last 24 hours) at 07/26/15 1302 Last data filed at 07/26/15 0600  Gross per 24 hour  Intake    490 ml  Output    250 ml  Net    240 ml    Exam Awake Alert, Oriented x 3, No new F.N deficits, Normal affect [place] .AT,PERRAL Supple Neck,No JVD, No cervical lymphadenopathy appriciated.  Symmetrical Chest wall movement, Good air movement bilaterally, CTAB RRR,No Gallops,Rubs or new Murmurs, No Parasternal Heave +ve B.Sounds, Abd Soft, Non tender, No organomegaly appriciated, No rebound -guarding or rigidity. No Cyanosis, Clubbing or edema, No new Rash or bruise  Data Review   CBC w Diff: Lab Results  Component Value Date   WBC 16.5* 07/26/2015   HGB 15.4 07/26/2015   HCT 47.3 07/26/2015   PLT 302 07/26/2015   LYMPHOPCT 34 06/28/2014   MONOPCT 12 06/28/2014   EOSPCT 6* 06/28/2014   BASOPCT 1 06/28/2014    CMP: Lab Results  Component Value Date   NA 139 07/26/2015   K 3.9 07/26/2015   CL 105 07/26/2015   CO2 26 07/26/2015   BUN 11 07/26/2015   CREATININE 0.89 07/26/2015   CREATININE 0.74 02/08/2012   PROT 7.0 06/28/2014   ALBUMIN 3.8 06/28/2014   BILITOT 0.4 06/28/2014   ALKPHOS 81 06/28/2014   AST 16 06/28/2014   ALT 14* 06/28/2014  .   Total Time in preparing paper work, data evaluation and todays exam - 35 minutes  Richad Ramsay M.D on 07/26/2015 at 1:02 PM  [facility]   Office  [phone]

## 2015-07-26 NOTE — Progress Notes (Signed)
  Echocardiogram 2D Echocardiogram has been performed.  Jennette Dubin 07/26/2015, 9:59 AM

## 2015-07-26 NOTE — Progress Notes (Signed)
STROKE TEAM PROGRESS NOTE   HISTORY OF PRESENT ILLNESS (per record) [name] is an 82 y.o. male presented tot he ED after he noted dizziness that started at 0900 hours this AM 07/25/2015 ([facility]). He felt unsteady on his feet but had no focal weakness or numbness.He states he felt dizzy and very nauseated including having bouts of emesis. Wife noted he feel to the floor to the right side and he was very diaphoretic. He has had a stroke in the past with some residual speech deficits. He currently is enrolled in the RESPECT trial at [facility]. Currently he has no symptoms. MRI obtained did show tiny acute infarct in the superior left occipital lobe. Family is very concerned as a recent member recently died due to a pontine infract. Neurology was asked to see patient.   Per old note in 2015 from Dr. Ellender Hose is a 82 y.o. male with PMH of DM and HLD presents episodes of difficulty getting words out. The first episode was about 5 years ago and then for the last 3 weeks, he had two more similar episodes. MRI showed left temporal cortical stroke, consistent with emboli stroke. His stroke work up so far unrevealing including TTE, CUS, MRA, 30 day cardiac monitoring (done by Dr. Wynonia Lawman, I have not have any result available to review). He apparently failed ASA and plavix at this point. However, we do not have indication for anticoagulation. For the treatment dilema, we will introduce RESPECT ESUS to him.   Patient was not administered IV t-PA secondary to symptoms resolved. He was admitted for further evaluation and treatment.   SUBJECTIVE (INTERVAL HISTORY) This is a very lovely gentleman who is up walking around his room, his wife is present. He wants to go home he feels wonderful and wants to get back on the golf course today. Discussion with wife and patient, it appears his stroke is embolic with unknown source. He was in a research trial where he was given a research medication of aspirin versus Pradaxa  however are not sure which clinical arm he was in. He has failed aspirin and Plavix in the past with suspicion of embolic stroke. Had a discussion that we could discharge patient on Plavix with outpatient TEE and loop recorder for surveillance of atrial fibrillation as source of the stroke. Patient does not want a wait till Monday for TEE and loop. Another possibility would be to start him on Coumadin which he declined. We'll start on Plavix, with outpatient TEE and loop and will start anticoagulation with Eliquis if afib is detected.    OBJECTIVE Temp:  [97.5 F (36.4 C)-98.2 F (36.8 C)] 98.2 F (36.8 C) (06/09 0913) Pulse Rate:  [64-96] 84 (06/09 0913) Cardiac Rhythm:  [-] Normal sinus rhythm (06/09 0706) Resp:  [14-22] 18 (06/09 0913) BP: (101-162)/(52-102) 139/76 mmHg (06/09 0913) SpO2:  [92 %-100 %] 97 % (06/09 0913) Weight:  [72.576 kg (160 lb)-73.664 kg (162 lb 6.4 oz)] 73.664 kg (162 lb 6.4 oz) (06/08 2110)  CBC:  Recent Labs Lab 07/25/15 1746 07/26/15 0515  WBC 16.3* 16.5*  HGB 16.0 15.4  HCT 49.5 47.3  MCV 96.3 94.6  PLT 305 [id number]    Basic Metabolic Panel:  Recent Labs Lab 07/25/15 1051 07/26/15 0515  NA 138 139  K 3.7 3.9  CL 104 105  CO2 28 26  GLUCOSE 170* 131*  BUN 13 11  CREATININE 0.77 0.89  CALCIUM 8.6* 8.7*    Lipid Panel:  Component Value Date/Time   CHOL 152 12/23/2013 0530   TRIG 168* 12/23/2013 0530   HDL 42 12/23/2013 0530   CHOLHDL 3.6 12/23/2013 0530   VLDL 34 12/23/2013 0530   LDLCALC 76 12/23/2013 0530   HgbA1c:  Lab Results  Component Value Date   HGBA1C 6.5* 07/25/2015   Urine Drug Screen:    Component Value Date/Time   LABOPIA NONE DETECTED 12/22/2013 1500   COCAINSCRNUR NONE DETECTED 12/22/2013 1500   LABBENZ NONE DETECTED 12/22/2013 1500   AMPHETMU NONE DETECTED 12/22/2013 1500   THCU NONE DETECTED 12/22/2013 1500   LABBARB NONE DETECTED 12/22/2013 1500      IMAGING  Ct Angio Head & Neck W/cm &/or Wo/cm 07/25/2015    No extracranial or intracranial stenosis of significance. The small acute LEFT occipital infarct noted on today's MR is not visible on CT. No hemorrhage is evident. Chronic and acute sinusitis.   Mr Brain Wo Contrast (neuro Protocol) 07/25/2015   1. Evidence of a tiny acute infarct in the superior left occipital lobe with no associated hemorrhage or mass effect. 2. A small chronic lacune in the right pons also is new since July 2016. 3. Otherwise stable MRI appearance of the brain suggesting chronic hypertensive and/or small vessel disease. 4. Chronic paranasal sinus disease with interval progression.   Echo Study Conclusions  - Left ventricle: The cavity size was normal. Wall thickness was  normal. Systolic function was mildly reduced. The estimated  ejection fraction was in the range of 45% to 50%. Wall motion was  normal; there were no regional wall motion abnormalities. Doppler  parameters are consistent with abnormal left ventricular  relaxation (grade 1 diastolic dysfunction).  PHYSICAL EXAM   ASSESSMENT/PLAN [name] is a 82 y.o. male with history of diverticulosis, GERD, arthritis, asthma, COPD, HLD, CVA, DM, esophageal stretcher status post dilatation  presenting with dizziness, nausea, diaphoresis. iHe did not receive IV t-PA due to symptoms resolved.   Stroke:  Tiny left occipital infarct, incidental finding in patient with hx of stroke which was felt to be embolic, likely an embolic source  Enrolled in Respect ESUS trial  MRI  Tiny L occipital infarct  CTA H&N  No significant stenosis. Acute and chronic sinusitis  2D Echo  As above  LDL not ordered  HgbA1c 6.5  Lovenox 40 mg sq daily for VTE prophylaxis  Diet Heart Room service appropriate?: Yes; Fluid consistency:: Thin  No antithrombotic prior to admission, now on aspirin 325 mg daily. Change to plavix 75 mg daily. Refused anticoagulation  Patient counseled to be compliant with his antithrombotic  medications  Ongoing aggressive stroke risk factor management  Therapy recommendations:  No therapy needs  Disposition:  Return home  OP TEE and loop recorder requested from [facility]  Follow up Dr. Erlinda Hong in 1 months  Hypertension  Stable  Permissive hypertension (OK if < 220/120) but gradually normalize in 5-7 days  Long-term BP goal normotensive  Hyperlipidemia  Home meds: zocor 20, resumed in hospital  LDL not ordered  LDL goal < 70  Continue statin at discharge  Diabetes  HgbA1c 6.5, goal < 7.0  Controlled  Other Stroke Risk Factors  Advanced age  ETOH use, advised to drink no more than 2 drinks a day  Hx stroke/TIA  Hx slurred speech 2010  Recurrent slurred speech 2015 x 3 episodes with   Other Active Problems  Leukocytosis  COPD  Seizures, on depakote  Insomnia, on [facility] day #  Personally examined patient and images, and have participated in and made any corrections needed to history, physical, neuro exam,assessment and plan as stated above.  I have personally obtained the history, evaluated lab date, reviewed imaging studies and agree with radiology interpretations.   This is a very lovely gentleman who is up walking around his room, his wife is present. He wants to go home he feels wonderful and wants to get back on the golf course today. Discussion with wife and patient, it appears his stroke is embolic with unknown source. He was in a research trial where he was given a research medication of aspirin versus Pradaxa however are not sure which clinical arm he was in. He has failed aspirin and Plavix in the past with suspicion of embolic stroke. Had a discussion that we could discharge patient on Plavix with outpatient TEE and loop recorder for surveillance of atrial fibrillation as source of the stroke. Patient does not want a wait till Monday for TEE and loop. Another possibility would be to start him on Coumadin which he declined.  We'll start on Plavix, with outpatient TEE and loop and will start anticoagulation with Eliquis if afib is detected.    Sarina Ill, MD Stroke Neurology [phone] [facility]    To contact Stroke Continuity provider, please refer to [email or web address] After hours, contact General Neurology

## 2015-07-26 NOTE — Progress Notes (Deleted)
PROGRESS NOTE                                                                                                                                                                                                             Patient Demographics:    [name], is a 82 y.o. male, DOB - 1931-06-07, [id number]  Admit date - 07/25/2015   Admitting Physician Waldemar Dickens, MD  Outpatient Primary MD for the patient is Dorian Heckle, MD  LOS -   Chief Complaint  Patient presents with  . Dizziness       Brief Narrative  82 y.o. male with history  of diverticulosis, GERD, arthritis, asthma, COPD, HLD, CVA, DM, esophageal stretcher after acute onset episode of dizziness, Workup significant for CVA, admitted for further workup    Subjective:    [name] today has, No headache, No chest pain, No abdominal pain - No Nausea, No new weakness tingling or numbness, No Cough - SOB.    Assessment  & Plan :    Active Problems:   Diabetes mellitus type 2, diet-controlled (HCC)   Seizures ([place])   HLD (hyperlipidemia)   Acute CVA (cerebrovascular accident) ([place])   Insomnia   COPD bronchitis  Acute CVA - MRI brain :Tiny acute infarct in the superior left occipital lobe - CTA head and neck:No extracranial or intracranial stenosis of significance - 2-D echo done but reading pending - Hemoglobin A1c is 6.5 - LDL pending - He was on investigational drug on a study (currently discontinued), currently on aspirin 325 mg daily.  COPD:  - stable/baseline. No O2  - Continue singulair, advair  DM:  - last A1c in 2015 showing 6.7. Diet controlled - Globin A1c 6.5 - SSI  Leukocytosis - Afebrile, negative urinalysis, will check CBC with differential in a.m.  HLD: - continue statin  Seizures:  - continue depakote  Insomnia: - continue ambien   Code Status : Full  Family Communication  : Wife at bedside  Disposition Plan  : Pending neuro  evaluation  Consults  :  neuro  Procedures  : none  DVT Prophylaxis  :  Lovenox   Lab Results  Component Value Date   PLT 302 07/26/2015    Antibiotics  :    Anti-infectives    None  Objective:   Filed Vitals:   07/26/15 0300 07/26/15 0500 07/26/15 0653 07/26/15 0913  BP: 123/66 123/74 101/52 139/76  Pulse: 80 81 73 84  Temp: 97.7 F (36.5 C) 97.5 F (36.4 C) 97.6 F (36.4 C) 98.2 F (36.8 C)  TempSrc: Oral Oral Oral Oral  Resp: 20 18 18 18   Height:      Weight:      SpO2: 92% 93% 94% 97%    Wt Readings from Last 3 Encounters:  07/25/15 73.664 kg (162 lb 6.4 oz)  07/25/15 72.576 kg (160 lb)  06/28/14 78.019 kg (172 lb)     Intake/Output Summary (Last 24 hours) at 07/26/15 1214 Last data filed at 07/26/15 0600  Gross per 24 hour  Intake    490 ml  Output    250 ml  Net    240 ml     Physical Exam  Awake Alert, Oriented X 3, No new F.N deficits, Normal affect [place].AT,PERRAL Supple Neck,No JVD, No cervical lymphadenopathy appriciated.  Symmetrical Chest wall movement, Good air movement bilaterally, CTAB RRR,No Gallops,Rubs or new Murmurs, No Parasternal Heave +ve B.Sounds, Abd Soft, No tenderness, No organomegaly appriciated, No rebound - guarding or rigidity. No Cyanosis, Clubbing or edema, No new Rash or bruise      Data Review:    CBC  Recent Labs Lab 07/25/15 1746 07/26/15 0515  WBC 16.3* 16.5*  HGB 16.0 15.4  HCT 49.5 47.3  PLT 305 302  MCV 96.3 94.6  MCH 31.1 30.8  MCHC 32.3 32.6  RDW 14.6 14.8    Chemistries   Recent Labs Lab 07/25/15 1051 07/26/15 0515  NA 138 139  K 3.7 3.9  CL 104 105  CO2 28 26  GLUCOSE 170* 131*  BUN 13 11  CREATININE 0.77 0.89  CALCIUM 8.6* 8.7*   ------------------------------------------------------------------------------------------------------------------ No results for input(s): CHOL, HDL, LDLCALC, TRIG, CHOLHDL, LDLDIRECT in the last 72 hours.  Lab Results  Component Value Date    HGBA1C 6.5* 07/25/2015   ------------------------------------------------------------------------------------------------------------------ No results for input(s): TSH, T4TOTAL, T3FREE, THYROIDAB in the last 72 hours.  Invalid input(s): FREET3 ------------------------------------------------------------------------------------------------------------------ No results for input(s): VITAMINB12, FOLATE, FERRITIN, TIBC, IRON, RETICCTPCT in the last 72 hours.  Coagulation profile No results for input(s): INR, PROTIME in the last 168 hours.  No results for input(s): DDIMER in the last 72 hours.  Cardiac Enzymes No results for input(s): CKMB, TROPONINI, MYOGLOBIN in the last 168 hours.  Invalid input(s): CK ------------------------------------------------------------------------------------------------------------------ No results found for: BNP  Inpatient Medications  Scheduled Meds: .  stroke: mapping our early stages of recovery book   Does not apply Once  . aspirin  300 mg Rectal Daily   Or  . aspirin  325 mg Oral Daily  . divalproex  500 mg Oral Daily  . enoxaparin (LOVENOX) injection  40 mg Subcutaneous Q24H  . insulin aspart  0-5 Units Subcutaneous QHS  . insulin aspart  0-9 Units Subcutaneous TID WC  . mometasone-formoterol  2 puff Inhalation BID  . montelukast  10 mg Oral QPM  . simvastatin  20 mg Oral Daily  . sodium chloride flush  3 mL Intravenous Q12H  . sodium chloride flush  3 mL Intravenous Q12H   Continuous Infusions:  PRN Meds:.sodium chloride, acetaminophen **OR** acetaminophen, hydrALAZINE, ondansetron **OR** ondansetron (ZOFRAN) IV, oxyCODONE, sodium chloride flush, zolpidem  Micro Results No results found for this or any previous visit (from the past 240 hour(s)).  Radiology Reports Ct Angio Head W/cm &/or Wo  Cm  07/25/2015  CLINICAL DATA:  82 year old male presenting with vertigo and disequilibrium earlier today. No focal weakness. Small acute infarct  noted in the LEFT occipital lobe. Suspected posterior circulation embolus. EXAM: CT ANGIOGRAPHY HEAD AND NECK TECHNIQUE: Multidetector CT imaging of the head and neck was performed using the standard protocol during bolus administration of intravenous contrast. Multiplanar CT image reconstructions and MIPs were obtained to evaluate the vascular anatomy. Carotid stenosis measurements (when applicable) are obtained utilizing NASCET criteria, using the distal internal carotid diameter as the denominator. CONTRAST:  Isovue 370, 50 mL. COMPARISON:  MR brain earlier today. Multiple prior MR exams dating as far back as 2008. FINDINGS: CT HEAD Calvarium and skull base: No fracture or destructive lesion. Mastoids and middle ears are grossly clear. Paranasal sinuses: No layering fluid. Chronic sinus disease in the BILATERAL maxillary, ethmoid, and frontal sinuses, with layering fluid in the LEFT sphenoid. Orbits: Negative. Brain: No evidence of acute hemorrhage, hydrocephalus, or mass lesion. The smaller acute infarction noted on MR is not visible. Atrophy with chronic microvascular ischemic change. CTA NECK Aortic arch: Standard branching. Imaged portion shows no evidence of aneurysm or dissection. No significant stenosis of the major arch vessel origins. Atheromatous change along the transverse arch and great vessel origins is non stenotic. Right carotid system: Minor atheromatous change. No evidence of dissection, stenosis (50% or greater) or occlusion. Left carotid system: Minor atheromatous change. No evidence of dissection, stenosis (50% or greater) or occlusion. Vertebral arteries: Codominant. No evidence of dissection, stenosis (50% or greater) or occlusion. Nonvascular soft tissues: Lung apices clear. No masses or lymphadenopathy. Spondylosis. CTA HEAD Anterior circulation: No significant skullbase or supraclinoid atheromatous change. No significant stenosis, proximal occlusion, aneurysm, or vascular malformation.  Posterior circulation: Unremarkable. Balanced codominant vertebrals. No significant stenosis, proximal occlusion, aneurysm, or vascular malformation. Venous sinuses: As permitted by contrast timing, patent. Anatomic variants: None of significance. Delayed phase:   No abnormal intracranial enhancement. IMPRESSION: No extracranial or intracranial stenosis of significance. The small acute LEFT occipital infarct noted on today's MR is not visible on CT. No hemorrhage is evident. Chronic and acute sinusitis. Electronically Signed   By: Staci Righter M.D.   On: 07/25/2015 21:14   Ct Angio Neck W/cm &/or Wo/cm  07/25/2015  CLINICAL DATA:  82 year old male presenting with vertigo and disequilibrium earlier today. No focal weakness. Small acute infarct noted in the LEFT occipital lobe. Suspected posterior circulation embolus. EXAM: CT ANGIOGRAPHY HEAD AND NECK TECHNIQUE: Multidetector CT imaging of the head and neck was performed using the standard protocol during bolus administration of intravenous contrast. Multiplanar CT image reconstructions and MIPs were obtained to evaluate the vascular anatomy. Carotid stenosis measurements (when applicable) are obtained utilizing NASCET criteria, using the distal internal carotid diameter as the denominator. CONTRAST:  Isovue 370, 50 mL. COMPARISON:  MR brain earlier today. Multiple prior MR exams dating as far back as 2008. FINDINGS: CT HEAD Calvarium and skull base: No fracture or destructive lesion. Mastoids and middle ears are grossly clear. Paranasal sinuses: No layering fluid. Chronic sinus disease in the BILATERAL maxillary, ethmoid, and frontal sinuses, with layering fluid in the LEFT sphenoid. Orbits: Negative. Brain: No evidence of acute hemorrhage, hydrocephalus, or mass lesion. The smaller acute infarction noted on MR is not visible. Atrophy with chronic microvascular ischemic change. CTA NECK Aortic arch: Standard branching. Imaged portion shows no evidence of aneurysm  or dissection. No significant stenosis of the major arch vessel origins. Atheromatous change along the transverse arch  and great vessel origins is non stenotic. Right carotid system: Minor atheromatous change. No evidence of dissection, stenosis (50% or greater) or occlusion. Left carotid system: Minor atheromatous change. No evidence of dissection, stenosis (50% or greater) or occlusion. Vertebral arteries: Codominant. No evidence of dissection, stenosis (50% or greater) or occlusion. Nonvascular soft tissues: Lung apices clear. No masses or lymphadenopathy. Spondylosis. CTA HEAD Anterior circulation: No significant skullbase or supraclinoid atheromatous change. No significant stenosis, proximal occlusion, aneurysm, or vascular malformation. Posterior circulation: Unremarkable. Balanced codominant vertebrals. No significant stenosis, proximal occlusion, aneurysm, or vascular malformation. Venous sinuses: As permitted by contrast timing, patent. Anatomic variants: None of significance. Delayed phase:   No abnormal intracranial enhancement. IMPRESSION: No extracranial or intracranial stenosis of significance. The small acute LEFT occipital infarct noted on today's MR is not visible on CT. No hemorrhage is evident. Chronic and acute sinusitis. Electronically Signed   By: Staci Righter M.D.   On: 07/25/2015 21:14   Mr Brain Wo Contrast (neuro Protocol)  07/25/2015  CLINICAL DATA:  82 year old male who awoke with severe dizziness and lower extremity weakness today. Initial encounter. EXAM: MRI HEAD WITHOUT CONTRAST TECHNIQUE: Multiplanar, multiecho pulse sequences of the brain and surrounding structures were obtained without intravenous contrast. COMPARISON:  Brain MRI 08/23/2014 and earlier. FINDINGS: Major intracranial vascular flow voids are stable. Small 4 mm area of cortical restricted diffusion suspected in the superior left occipital lobe on series 4, image 29. Diffusion elsewhere appears within normal limits.  No associated mass effect, hemorrhage, or definite T2 signal abnormality. No midline shift, mass effect, evidence of mass lesion, ventriculomegaly, extra-axial collection or acute intracranial hemorrhage. Cervicomedullary junction and pituitary are within normal limits. Aside from the small acute finding gray and white matter signal in the brain is stable since 2016 including scattered moderate for age white matter T2 and FLAIR hyperintensity. Small chronic micro hemorrhage in the right thalamus is stable. Chronic widespread T2 heterogeneity in the deep gray matter again noted and in part due to increased perivascular spaces. There might be a tiny chronic lacune in the right pons on series 6, image 9 which was not evident previously. The cerebellum is stable and within normal limits. Visible internal auditory structures appear normal. Chronic paranasal sinus disease with worsening opacification of the frontal and ethmoid sinuses from the prior study. Susceptibility artifact today about the tube right maxilla of unclear significance. Stable orbit and scalp soft tissues. Stable and negative for age visualized cervical spine. Normal bone marrow signal. IMPRESSION: 1. Evidence of a tiny acute infarct in the superior left occipital lobe with no associated hemorrhage or mass effect. 2. A small chronic lacune in the right pons also is new since July 2016. 3. Otherwise stable MRI appearance of the brain suggesting chronic hypertensive and/or small vessel disease. 4. Chronic paranasal sinus disease with interval progression. Electronically Signed   By: Genevie Ann M.D.   On: 07/25/2015 13:40     Pahola Dimmitt M.D on 07/26/2015 at 12:14 PM  Between 7am to 7pm - Pager - [phone]  After 7pm go to [email or web address] - password [facility]  [facility] -  Office  [phone]

## 2015-07-26 NOTE — Care Management Note (Signed)
Case Management Note  Patient Details  Name: ZAHIR MOMINEE MRN: [id number] Date of Birth: 29-Apr-1931  Subjective/Objective:    Pt admitted with CVA. He is from home with his wife.                 Action/Plan: Patient discharging home today with self care. No f/u per PT/OT. No further needs per CM.   Expected Discharge Date:  07/26/15               Expected Discharge Plan:  Home/Self Care  In-House Referral:     Discharge planning Services  CM Consult  Post Acute Care Choice:    Choice offered to:     DME Arranged:    DME Agency:     HH Arranged:    HH Agency:     Status of Service:  Completed, signed off  Medicare Important Message Given:    Date Medicare IM Given:    Medicare IM give by:    Date Additional Medicare IM Given:    Additional Medicare Important Message give by:     If discussed at [place] of Stay Meetings, dates discussed:    Additional Comments:  Pollie Friar, RN 07/26/2015, 1:51 PM

## 2015-07-26 NOTE — Discharge Instructions (Signed)
Follow with Primary MD Dorian Heckle, MD in 7 days   Get CBC, CMP,  checked  by Primary MD next visit.    Activity: As tolerated with Full fall precautions use walker/cane & assistance as needed   Disposition Home    Diet: Heart Healthy , with feeding assistance and aspiration precautions.  For Heart failure patients - Check your Weight same time everyday, if you gain over 2 pounds, or you develop in leg swelling, experience more shortness of breath or chest pain, call your Primary MD immediately. Follow Cardiac Low Salt Diet and 1.5 lit/day fluid restriction.   On your next visit with your primary care physician please Get Medicines reviewed and adjusted.   Please request your Prim.MD to go over all Hospital Tests and Procedure/Radiological results at the follow up, please get all Hospital records sent to your Prim MD by signing hospital release before you go home.   If you experience worsening of your admission symptoms, develop shortness of breath, life threatening emergency, suicidal or homicidal thoughts you must seek medical attention immediately by calling 911 or calling your MD immediately  if symptoms less severe.  You Must read complete instructions/literature along with all the possible adverse reactions/side effects for all the Medicines you take and that have been prescribed to you. Take any new Medicines after you have completely understood and accpet all the possible adverse reactions/side effects.   Do not drive, operating heavy machinery, perform activities at heights, swimming or participation in water activities or provide baby sitting services if your were admitted for syncope or siezures until you have seen by Primary MD or a Neurologist and advised to do so again.  Do not drive when taking Pain medications.    Do not take more than prescribed Pain, Sleep and Anxiety Medications  Special Instructions: If you have smoked or chewed Tobacco  in the last 2 yrs  please stop smoking, stop any regular Alcohol  and or any Recreational drug use.  Wear Seat belts while driving.   Please note  You were cared for by a hospitalist during your hospital stay. If you have any questions about your discharge medications or the care you received while you were in the hospital after you are discharged, you can call the unit and asked to speak with the hospitalist on call if the hospitalist that took care of you is not available. Once you are discharged, your primary care physician will handle any further medical issues. Please note that NO REFILLS for any discharge medications will be authorized once you are discharged, as it is imperative that you return to your primary care physician (or establish a relationship with a primary care physician if you do not have one) for your aftercare needs so that they can reassess your need for medications and monitor your lab values.

## 2015-07-26 NOTE — Evaluation (Signed)
Occupational Therapy Evaluation and Discharge Patient Details Name: [name] MRN: [id number] DOB: [birth year] Today's Date: 07/26/2015    History of Present Illness [name] is a 82 y.o. male with medical history significant of diverticulosis, GERD, arthritis, asthma, COPD, HLD, CVA, DM, esophageal stretcher status post dilatation presenting after acute onset episode of dizziness. MRI on 6/8 + for tiny acute infarct in the superior left occipital lobe.   Clinical Impression   Pt reports he was independent with ADLs and mobility PTA. Currently pt is at baseline with ADLs and functional mobility; no balance deficits noted throughout session. Pt with good UE strength bil and presenting without visual deficits. Pt c/o decreased fine motor coordination and strength bil; present PTA but pt inquiring if there were exercises he could do to build strength/coordination in bil hands. Provided pt with theraputty and squeeze ball exercises; pt able to return demo exercises and tolerating well. Pt planning to d/c home with 24/7 supervision from his wife. No further acute OT needs identified; signing off at this time. Please re-consult if needs change. Thank you for this referral.    Follow Up Recommendations  No OT follow up;Supervision - Intermittent    Equipment Recommendations  None recommended by OT    Recommendations for Other Services       Precautions / Restrictions Precautions Precautions: None Restrictions Weight Bearing Restrictions: No      Mobility Bed Mobility Overal bed mobility:             General bed mobility comments: Pt OOB upon arrival.  Transfers Overall transfer level: Independent Equipment used: None             General transfer comment: No unsteadiness or LOB with higher level balance activities.    Balance Overall balance assessment: Needs assistance Sitting-balance support: Feet supported;No upper extremity supported Sitting balance-Leahy  Scale: Normal     Standing balance support: No upper extremity supported;During functional activity Standing balance-Leahy Scale: Normal                         ADL Overall ADL's : Independent;At baseline                                       General ADL Comments: Pt able to perform toilet transfer, shower transfer and LB ADLs with independence. No LOB or unsteadiness noted. Pt c/o decreased strength and fine motor coordination in bil hands; present PTA but pt asking for exercises he can do to build strength-provided with theraputty, squeeze ball, and strengthening exercises.     Vision Vision Assessment?: Yes;No apparent visual deficits Additional Comments: Overall vision WFL.   Perception     Praxis      Pertinent Vitals/Pain Pain Assessment: No/denies pain     Hand Dominance Right   Extremity/Trunk Assessment Upper Extremity Assessment Upper Extremity Assessment: Overall WFL for tasks assessed (pt c/o decreased fine motor coordination/strength bil)   Lower Extremity Assessment Lower Extremity Assessment: Defer to PT evaluation   Cervical / Trunk Assessment Cervical / Trunk Assessment: Normal   Communication Communication Communication: No difficulties   Cognition Arousal/Alertness: Awake/alert Behavior During Therapy: WFL for tasks assessed/performed Overall Cognitive Status: Within Functional Limits for tasks assessed                     General Comments  Exercises Exercises: Other exercises Other Exercises Other Exercises: Provided pt with red theraputty and handout for exercises. Pt tolerating exercises well bil. Other Exercises: Provided pt with squeeze ball and handout for exercises. Pt tolerating squeeze ball exercises well.   Shoulder Instructions      Home Living Family/patient expects to be discharged to:: Private residence Living Arrangements: Spouse/significant other Available Help at Discharge:  Family;Available 24 hours/day Type of Home: House Home Access: Level entry     Home Layout: One level     Bathroom Shower/Tub: Occupational psychologist: Handicapped height     Home Equipment: Shower seat;Grab bars - tub/shower   Additional Comments: lives at [facility]      Prior Functioning/Environment Level of Independence: Independent             OT Diagnosis: Generalized weakness   OT Problem List:     OT Treatment/Interventions:      OT Goals(Current goals can be found in the care plan section) Acute Rehab OT Goals Patient Stated Goal: return home OT Goal Formulation: All assessment and education complete, DC therapy  OT Frequency:     Barriers to D/C:            Co-evaluation              End of Session    Activity Tolerance: Patient tolerated treatment well Patient left: in chair;with call bell/phone within reach;with family/visitor present   Time: 1050-1110 OT Time Calculation (min): 20 min Charges:  OT General Charges $OT Visit: 1 Procedure OT Evaluation $OT Eval Low Complexity: 1 Procedure G-Codes: OT G-codes **NOT FOR INPATIENT CLASS** Functional Assessment Tool Used: Clinical judgement Functional Limitation: Self care Self Care Current Status [id number]): 0 percent impaired, limited or restricted Self Care Goal Status [id number]): 0 percent impaired, limited or restricted Self Care Discharge Status [id number]): 0 percent impaired, limited or restricted   Binnie Kand M.S., OTR/L Pager: [phone]  07/26/2015, 11:24 AM

## 2015-07-26 NOTE — Evaluation (Signed)
Physical Therapy Evaluation & Discharge Patient Details Name: [name] MRN: [id number] DOB: 10-Mar-1931 Today's Date: 07/26/2015   History of Present Illness  KEDUS DROUHARD is a 82 y.o. male with medical history significant of diverticulosis, GERD, arthritis, asthma, COPD, HLD, CVA, DM, esophageal stretcher status post dilatation presenting after acute onset episode of dizziness.   Clinical Impression  Patient presents at baseline level independent with mobility and no further skilled PT needs identified.  Did check for BPPV without symptoms or nystagmus.  Also reviewed stroke warning signs and risk factors.  Will sign off.     Follow Up Recommendations No PT follow up    Equipment Recommendations  None recommended by PT    Recommendations for Other Services       Precautions / Restrictions Precautions Precautions: None      Mobility  Bed Mobility Overal bed mobility: Independent                Transfers Overall transfer level: Independent                  Ambulation/Gait Ambulation/Gait assistance: Independent Ambulation Distance (Feet): 220 Feet Assistive device: None Gait Pattern/deviations: WFL(Within Functional Limits)   Gait velocity interpretation: at or above normal speed for age/gender    Stairs Stairs: Yes Stairs assistance: Modified independent (Device/Increase time) Stair Management: One rail Right;Alternating pattern;Forwards Number of Stairs: 3    Wheelchair Mobility    Modified Rankin (Stroke Patients Only) Modified Rankin (Stroke Patients Only) Pre-Morbid Rankin Score: No symptoms Modified Rankin: No significant disability     Balance                                 Standardized Balance Assessment Standardized Balance Assessment : Dynamic Gait Index   Dynamic Gait Index Level Surface: Normal Change in Gait Speed: Normal Gait with Horizontal Head Turns: Normal Gait with Vertical Head Turns: Normal Gait  and Pivot Turn: Normal Step Over Obstacle: Normal Step Around Obstacles: Normal Steps: Mild Impairment Total Score: 23       Pertinent Vitals/Pain Pain Assessment: No/denies pain    Home Living Family/patient expects to be discharged to:: Private residence Living Arrangements: Spouse/significant other Available Help at Discharge: Family;Available 24 hours/day Type of Home: House Home Access: Level entry     Home Layout: One level Home Equipment: Shower seat;Grab bars - tub/shower Additional Comments: lives at [facility]    Prior Function Level of Independence: Independent               Hand Dominance   Dominant Hand: Right    Extremity/Trunk Assessment   Upper Extremity Assessment: Overall WFL for tasks assessed (intact sensation and coordination)           Lower Extremity Assessment: Overall WFL for tasks assessed (intact sensation, little difference with R coordinatino with toe taps as compared to L)         Communication   Communication: No difficulties  Cognition Arousal/Alertness: Awake/alert Behavior During Therapy: WFL for tasks assessed/performed Overall Cognitive Status: Within Functional Limits for tasks assessed                      General Comments General comments (skin integrity, edema, etc.): reviewed stroke warning signs and symptoms as well as risk factors with patient; assessed for BPPV with modified hall pike and supine head roll without symptoms or nystagmus noted  Exercises        Assessment/Plan    PT Assessment Patent does not need any further PT services  PT Diagnosis Difficulty walking   PT Problem List    PT Treatment Interventions     PT Goals (Current goals can be found in the Care Plan section) Acute Rehab PT Goals PT Goal Formulation: All assessment and education complete, DC therapy    Frequency     Barriers to discharge        Co-evaluation               End of Session Equipment  Utilized During Treatment: Gait belt Activity Tolerance: Patient tolerated treatment well Patient left: in chair;with call bell/phone within reach      Functional Assessment Tool Used: Clinical Judgement Functional Limitation: Self care Self Care Current Status [id number]): At least 1 percent but less than 20 percent impaired, limited or restricted Self Care Goal Status [id number]): 0 percent impaired, limited or restricted Self Care Discharge Status [phone]): 0 percent impaired, limited or restricted    Time: 0736-0800 PT Time Calculation (min) (ACUTE ONLY): 24 min   Charges:   PT Evaluation $PT Eval Moderate Complexity: 1 Procedure PT Treatments $Self Care/Home Management: 8-22   PT G Codes:   PT G-Codes **NOT FOR INPATIENT CLASS** Functional Assessment Tool Used: Clinical Judgement Functional Limitation: Self care Self Care Current Status [id number]): At least 1 percent but less than 20 percent impaired, limited or restricted Self Care Goal Status [id number]): 0 percent impaired, limited or restricted Self Care Discharge Status [phone]): 0 percent impaired, limited or restricted    Reginia Naas 07/26/2015, Ray, [place] 07/26/2015

## 2015-08-02 ENCOUNTER — Encounter (INDEPENDENT_AMBULATORY_CARE_PROVIDER_SITE_OTHER): Payer: Self-pay | Admitting: Neurology

## 2015-08-02 DIAGNOSIS — J4541 Moderate persistent asthma with (acute) exacerbation: Secondary | ICD-10-CM | POA: Diagnosis not present

## 2015-08-02 DIAGNOSIS — E119 Type 2 diabetes mellitus without complications: Secondary | ICD-10-CM | POA: Diagnosis not present

## 2015-08-02 DIAGNOSIS — I10 Essential (primary) hypertension: Secondary | ICD-10-CM | POA: Diagnosis not present

## 2015-08-02 DIAGNOSIS — I639 Cerebral infarction, unspecified: Secondary | ICD-10-CM

## 2015-08-02 DIAGNOSIS — I6789 Other cerebrovascular disease: Secondary | ICD-10-CM | POA: Diagnosis not present

## 2015-08-02 NOTE — Progress Notes (Signed)
Pt came in today for RESPECT ESUS trial follow up visit after discontinue the trial medications.  He had acute onset of dizziness on 07/25/15 and was admitted to [facility]. MRI showed tiny acute infarct in the superior left occipital lobe. CTA head and neck unremarkable. EF 45-50%, A1C 6.5, LDL not checked. His investigational meds were discontinued. He was put on plavix. Further embolic work up deferred as he would like to be discharged the 2nd day.   Discussed with pt and his wife that he had recurrent episodes of word finding difficulty. MRI on 12/22/13 showed acute infarct at left temporal cortical region. He was enrolled to RESPECT ESUS trial to compare ASA vs. pradaxa in 04/2014. However, he had another TIA episode with word finding difficulty and MRI negative. He was managed to be remained in the trial. This time his symptoms are different from previous, but MRI showed acute tiny infarct at left occipital lobe. He had 30 day cardiac monitoring in the past with Dr. Wynonia Lawman was told negative for afib. During to recurrent episodes and embolic pattern, we can either put him on coumadin and INR 2-3 or continue plavix and check TEE and consider loop recorder. Pros and cons of either approach have been discussed with pt and wife. Pt more leaning towards coumadin and wife leaning toward the other. They would like to further discuss with PCP Dr. Delfina Redwood and let me know. I will forward the note to Dr. Delfina Redwood today and they will call him next week.   [name], MD PhD Stroke Neurology 08/02/2015 1:52 PM

## 2015-08-07 ENCOUNTER — Telehealth: Payer: Self-pay | Admitting: Neurology

## 2015-08-07 NOTE — Telephone Encounter (Addendum)
Rn call patient back about the note Dr. Erlinda Hong sent Dr. Delfina Redwood concerning the patient. Pt stated per Dr. Delfina Redwood he does not have the email or note. Pt states he understands the need to speak with Dr. Delfina Redwood regarding the anticoagulant medication change.Pt states he will seeing Dr. Delfina Redwood this week to discuss the possible medication change to Coumadin. Pt verbalized understanding.

## 2015-08-07 NOTE — Telephone Encounter (Signed)
I think I might not make it clear last week. I have already sent note to Dr. Delfina Redwood. And I would like him to call Dr. Delfina Redwood this week and discuss with Dr. Delfina Redwood which options he and Dr. Delfina Redwood would like to go. I will agree whatever they have decided.   Hi, Katrina, please call pt back and ask him to discuss with Dr. Delfina Redwood and let us know. I have sent the note to Dr. Delfina Redwood and he will know what I have discussed with him. Thanks.  [name], MD PhD Stroke Neurology 08/07/2015 12:41 PM

## 2015-08-07 NOTE — Telephone Encounter (Signed)
Rn call patient back about Dr. Erlinda Hong contacting Dr.Polite about the anticoagulant medication. PT stated he saw Dr. Erlinda Hong on 6/02/22/2015 in research and he is currently on plavix now. Pt states he is leaning more towards coumadin as his choice because of his recent stroke. Pt stated he just wants to know if it was discuss with Dr. Delfina Redwood his PCP. The PCP will be handling the blood work and coumadin if his PCP agree to it. Rn stated a message will be sent to Dr. Erlinda Hong.

## 2015-08-07 NOTE — Telephone Encounter (Signed)
LFt vm for patient to call back concerning Dr. Erlinda Hong note below.

## 2015-08-07 NOTE — Telephone Encounter (Signed)
Patient called, states when he was here recently, Dr. Erlinda Hong was going to send a message to Dr. Delfina Redwood, states Dr. Delfina Redwood never got the message.

## 2015-08-07 NOTE — Telephone Encounter (Signed)
Pt returned call. Please call [phone]

## 2015-08-08 DIAGNOSIS — I639 Cerebral infarction, unspecified: Secondary | ICD-10-CM | POA: Diagnosis not present

## 2015-08-08 DIAGNOSIS — J4540 Moderate persistent asthma, uncomplicated: Secondary | ICD-10-CM | POA: Diagnosis not present

## 2015-08-08 NOTE — Telephone Encounter (Signed)
Patient called to advise nurse, he has decided to go on Coumadin.

## 2015-08-08 NOTE — Telephone Encounter (Signed)
Message sent to Dr. Erlinda Hong about patient going on coumadin.

## 2015-08-12 ENCOUNTER — Telehealth: Payer: Self-pay | Admitting: Neurology

## 2015-08-12 NOTE — Telephone Encounter (Signed)
I called pt to sched apt for 1 mo hosp f/u and pt stated that he just saw you about 8 days ago in the research/ do do want pt to still come in for a 1 mo f/u with you?

## 2015-08-13 NOTE — Telephone Encounter (Signed)
RN call patient about if he started his coumadin with his PCP, Dr. Delfina Redwood. Pt stated he has a bad sinus infection and is on prednisone. Pt stated he was schedule to start last week,but Dr. Delfina Redwood wanted to wait until he finish his steroids. Pt states he will call  Dr. Delfina Redwood office once he completes the prednisone pills. Dr. Delfina Redwood office is aware and will order blood work for coumadin dosage and medication. Pt is still  plavix now until he finish the steroids.

## 2015-08-13 NOTE — Telephone Encounter (Signed)
Yes, pt will follow up with me in research department as scheduled. THanks.   Hi, Katrina, if pt on coumadin yet? I do not think I have ordered coumadin for him. He said he is to see Dr. Delfina Redwood this week. Please give him a call to see if Dr. Delfina Redwood has ordered coumadin for him. If not on yet, we probably need to order coumadin for him. Thanks.  [name], MD PhD Stroke Neurology 08/13/2015 8:53 AM

## 2015-08-13 NOTE — Telephone Encounter (Signed)
Patient is in research study. Pt will stay in research study, and Dr. Erlinda Hong can make adjustments if patient needs to be seen in clinic.

## 2015-08-22 ENCOUNTER — Telehealth: Payer: Self-pay | Admitting: Neurology

## 2015-08-22 NOTE — Telephone Encounter (Signed)
Rn call patient back. Rn stated last phone call was 08/12/2015 per phone note. Rn stated a message was left a long time ago. PT stated he is off steroids and Dr.Polite is back from vacation. Rn stated he needs to call the office this week to schedule blood work and get the coumadin dosage started. Rn stated this is per Dr.Xu. Pt stated he will call the office.

## 2015-08-22 NOTE — Telephone Encounter (Signed)
Pt called back after phone call being disconnected. He thinks it has to do with blood work f/u, not sure  [phone]

## 2015-09-05 DIAGNOSIS — Z7902 Long term (current) use of antithrombotics/antiplatelets: Secondary | ICD-10-CM | POA: Diagnosis not present

## 2015-09-12 DIAGNOSIS — Z7902 Long term (current) use of antithrombotics/antiplatelets: Secondary | ICD-10-CM | POA: Diagnosis not present

## 2015-09-19 DIAGNOSIS — Z7902 Long term (current) use of antithrombotics/antiplatelets: Secondary | ICD-10-CM | POA: Diagnosis not present

## 2015-09-26 DIAGNOSIS — Z7902 Long term (current) use of antithrombotics/antiplatelets: Secondary | ICD-10-CM | POA: Diagnosis not present

## 2015-09-30 DIAGNOSIS — J4540 Moderate persistent asthma, uncomplicated: Secondary | ICD-10-CM | POA: Diagnosis not present

## 2015-09-30 DIAGNOSIS — J029 Acute pharyngitis, unspecified: Secondary | ICD-10-CM | POA: Diagnosis not present

## 2015-09-30 DIAGNOSIS — R05 Cough: Secondary | ICD-10-CM | POA: Diagnosis not present

## 2015-09-30 DIAGNOSIS — J209 Acute bronchitis, unspecified: Secondary | ICD-10-CM | POA: Diagnosis not present

## 2015-10-03 DIAGNOSIS — Z7902 Long term (current) use of antithrombotics/antiplatelets: Secondary | ICD-10-CM | POA: Diagnosis not present

## 2015-10-07 DIAGNOSIS — J4540 Moderate persistent asthma, uncomplicated: Secondary | ICD-10-CM | POA: Diagnosis not present

## 2015-10-07 DIAGNOSIS — I679 Cerebrovascular disease, unspecified: Secondary | ICD-10-CM | POA: Diagnosis not present

## 2015-10-07 DIAGNOSIS — Z7902 Long term (current) use of antithrombotics/antiplatelets: Secondary | ICD-10-CM | POA: Diagnosis not present

## 2015-10-07 DIAGNOSIS — E119 Type 2 diabetes mellitus without complications: Secondary | ICD-10-CM | POA: Diagnosis not present

## 2015-10-08 DIAGNOSIS — R69 Illness, unspecified: Secondary | ICD-10-CM | POA: Diagnosis not present

## 2015-10-17 DIAGNOSIS — Z7902 Long term (current) use of antithrombotics/antiplatelets: Secondary | ICD-10-CM | POA: Diagnosis not present

## 2015-10-30 ENCOUNTER — Encounter (INDEPENDENT_AMBULATORY_CARE_PROVIDER_SITE_OTHER): Payer: Self-pay

## 2015-10-30 ENCOUNTER — Institutional Professional Consult (permissible substitution): Payer: Medicare HMO | Admitting: Pulmonary Disease

## 2015-10-30 DIAGNOSIS — Z0289 Encounter for other administrative examinations: Secondary | ICD-10-CM

## 2015-10-31 ENCOUNTER — Ambulatory Visit (INDEPENDENT_AMBULATORY_CARE_PROVIDER_SITE_OTHER)
Admission: RE | Admit: 2015-10-31 | Discharge: 2015-10-31 | Disposition: A | Discharge status: Home / Self Care | Payer: Medicare HMO | Source: Ambulatory Visit | Attending: Internal Medicine | Admitting: Internal Medicine

## 2015-10-31 ENCOUNTER — Ambulatory Visit (INDEPENDENT_AMBULATORY_CARE_PROVIDER_SITE_OTHER): Payer: Medicare HMO | Admitting: Internal Medicine

## 2015-10-31 ENCOUNTER — Encounter (INDEPENDENT_AMBULATORY_CARE_PROVIDER_SITE_OTHER): Payer: Self-pay

## 2015-10-31 ENCOUNTER — Other Ambulatory Visit (INDEPENDENT_AMBULATORY_CARE_PROVIDER_SITE_OTHER): Payer: Medicare HMO

## 2015-10-31 VITALS — BP 142/76 | HR 83 | Ht 70.5 in | Wt 167.0 lb

## 2015-10-31 DIAGNOSIS — J45991 Cough variant asthma: Secondary | ICD-10-CM | POA: Diagnosis not present

## 2015-10-31 DIAGNOSIS — Z0000 Encounter for general adult medical examination without abnormal findings: Secondary | ICD-10-CM | POA: Diagnosis not present

## 2015-10-31 LAB — CBC WITH DIFFERENTIAL/PLATELET
BASOS ABS: 0.1 10*3/uL (ref 0.0–0.1)
Basophils Relative: 0.7 % (ref 0.0–3.0)
EOS ABS: 1.2 10*3/uL — AB (ref 0.0–0.7)
Eosinophils Relative: 10.4 % — ABNORMAL HIGH (ref 0.0–5.0)
HEMATOCRIT: 49.8 % (ref 39.0–52.0)
HEMOGLOBIN: 16.4 g/dL (ref 13.0–17.0)
LYMPHS PCT: 28.6 % (ref 12.0–46.0)
Lymphs Abs: 3.2 10*3/uL (ref 0.7–4.0)
MCHC: 32.9 g/dL (ref 30.0–36.0)
MCV: 94.2 fl (ref 78.0–100.0)
MONO ABS: 1.2 10*3/uL — AB (ref 0.1–1.0)
Monocytes Relative: 11 % (ref 3.0–12.0)
Neutro Abs: 5.5 10*3/uL (ref 1.4–7.7)
Neutrophils Relative %: 49.3 % (ref 43.0–77.0)
Platelets: 352 10*3/uL (ref 150.0–400.0)
RBC: 5.29 Mil/uL (ref 4.22–5.81)
RDW: 14.3 % (ref 11.5–15.5)
WBC: 11.3 10*3/uL — AB (ref 4.0–10.5)

## 2015-10-31 LAB — NITRIC OXIDE: NITRIC OXIDE: 71

## 2015-10-31 MED ORDER — MONTELUKAST SODIUM 10 MG PO TABS: 10.0000 mg | ORAL_TABLET | Freq: Every day | ORAL | 11 refills | Status: DC

## 2015-10-31 MED ORDER — BUDESONIDE-FORMOTEROL FUMARATE 80-4.5 MCG/ACT IN AERO: 2.0000 | INHALATION_SPRAY | Freq: Two times a day (BID) | RESPIRATORY_TRACT | 11 refills | Status: DC

## 2015-10-31 NOTE — Progress Notes (Signed)
Subjective:    Patient ID: [name], male    DOB: 01-Feb-1932,    MRN: [id number]  HPI  31 yowm never smoker with intermittent cough x early 2000's  maint on advair since shortly p onset and only better for 6-8 weeks cylcles  then requires rx with prednisone referred to pulmonary clinic 10/31/2015 by Dr Delfina Redwood.   10/31/2015 1st [facility] office visit/ [facility]   Chief Complaint  Patient presents with  . Pulmonary Consult    Referred by Dr. Delfina Redwood. Pt c/o "ongoing problem with head colds" for the past 12-15 yrs off and on. He states his "head congestion" has become worse over the past 2 yrs. He recently took prednisone and feels "great today".    sinus surgery by Lucia Gaskins x 12 y prior to [place] "didn't help"   And maint on advair with last flare in August 2017  But tendency is for nasal congestion and drainage go down to chest and stay that way until gets another round of prednisone.  Better presently/ no cough/ congestion/ not maint on flonase but now on advair hfa (denies ever getting the mdi but that's not consitent with hx of on it since early 2000s)   No obvious  patterns in day to day or daytime variabilty or assoc sob  or cp or chest tightness, subjective wheeze overt  hb symptoms. No unusual exp hx or h/o childhood pna/ asthma or knowledge of premature birth.  Sleeping ok without nocturnal  or early am exacerbation  of respiratory  c/o's or need for noct saba. Also denies any obvious fluctuation of symptoms with weather or environmental changes or other aggravating or alleviating factors except as outlined above   Current Medications, Allergies, Complete Past Medical History, Past Surgical History, Family History, and Social History were reviewed in [employer] record.            Review of Systems  Constitutional: Negative for activity change, appetite change, chills, fever and unexpected weight change.  HENT: Positive for congestion. Negative for dental  problem, postnasal drip, rhinorrhea, sneezing, sore throat, trouble swallowing and voice change.   Eyes: Negative for visual disturbance.  Respiratory: Negative for cough, choking and shortness of breath.   Cardiovascular: Negative for chest pain and leg swelling.  Gastrointestinal: Negative for abdominal pain, nausea and vomiting.  Genitourinary: Negative for difficulty urinating.  Musculoskeletal: Negative for arthralgias.  Skin: Negative for rash.  Psychiatric/Behavioral: Negative for behavioral problems and confusion.       Objective:   Physical Exam  amb anxious wm nad  Wt Readings from Last 3 Encounters:  10/31/15 167 lb (75.8 kg)  07/25/15 162 lb 6.4 oz (73.7 kg)  07/25/15 160 lb (72.6 kg)    Vital signs reviewed   HEENT: nl dentition,   and oropharynx. Nl external ear canals without cough reflex - moderate bilateral non-specific turbinate edema     NECK :  without JVD/Nodes/TM/ nl carotid upstrokes bilaterally   LUNGS: no acc muscle use,  Nl contour chest which is clear to A and P bilaterally with cough  exp maneuvers only    CV:  RRR  no s3 or murmur or increase in P2, no edema   ABD:  soft and nontender with nl inspiratory excursion in the supine position. No bruits or organomegaly, bowel sounds nl  MS:  Nl gait/ ext warm without deformities, calf tenderness, cyanosis or clubbing No obvious joint restrictions   SKIN: warm and dry without  lesions    NEURO:  alert, approp, nl sensorium with  no motor deficits   CXR PA and Lateral:   10/31/2015 :    I personally reviewed images and agree with radiology impression as follows:   No acute cardiopulmonary disease.  No change from the prior study.   Labs 10/31/2015 =  Cbc with diff/ allergy profile          Assessment & Plan:

## 2015-10-31 NOTE — Assessment & Plan Note (Addendum)
FENO 10/31/2015  =   73  p last prednisone x sev weeks while on advair  - spirometry 10/31/2015 wnl including fef 25-75  p am advair   - Allergy profile 10/31/2015 >  Eos 1.2 /  IgE   - 10/31/2015  After extensive coaching HFA effectiveness =    50% > try symbicort 80 2bid    There is very strong evidence of a steroid responsive chronic eos inflammatory disorder here : eos rhinitis/ bronchitis or cough variant asthma all fit the pattern but high dose ICS are prone to irritate the upper airway so much they sometimes backfire and this may be the case with advair > try symbicort 80 2bid / singulair empirically to see if makes any difference on the freq / severity of his flares.    Key is to be sure he's using the topicals to the extent he is capable of:  I used  the analogy of putting steroid cream on a rash to help explain the meaning of topical therapy and the need to get the drug to the target tissue.    Total time devoted to counseling  = 35/16m review case with pt/ discussion of options/alternatives/ personally creating written instructions  in presence of pt  then going over those specific  Instructions directly with the pt including how to use all of the meds but in particular covering each new medication in detail and the difference between the maintenance/automatic meds and the prns using an action plan format for the latter.

## 2015-10-31 NOTE — Patient Instructions (Signed)
Plan A = Automatic = stop advair and start symbicort 80 Take 2 puffs first thing in am and then another 2 puffs about 12 hours later Plus start singulair 10 mg each am    Work on inhaler technique:  relax and gently blow all the way out then take a nice smooth deep breath back in, triggering the inhaler at same time you start breathing in.  Hold for up to 5 seconds if you can. Blow out thru nose. Rinse and gargle with water when done      Plan B = Backup Only use your albuterol as a rescue medication to be used if you can't catch your breath by resting or doing a relaxed purse lip breathing pattern.  - The less you use it, the better it will work when you need it. - Ok to use the inhaler up to 2 puffs  every 4 hours if you must but call for appointment if use goes up over your usual need - Don't leave home without it !!  (think of it like the spare tire for your car)    Please remember to go to the lab and x-ray department downstairs for your tests - we will call you with the results when they are available.     Please schedule a follow up office visit in 6 weeks, call sooner if needed - bring all medications with you

## 2015-11-01 ENCOUNTER — Telehealth: Payer: Self-pay | Admitting: Internal Medicine

## 2015-11-01 LAB — RESPIRATORY ALLERGY PROFILE REGION II ~~LOC~~
Allergen, C. Herbarum, M2: <0.1 kU/L
Allergen, Comm Silver Birch, t9: <0.1 kU/L
Allergen, Cottonwood, t14: <0.1 kU/L
Allergen, Mulberry, t76: <0.1 kU/L
Allergen, P. notatum, m1: <0.1 kU/L
Aspergillus fumigatus, m3: <0.1 kU/L
Bermuda Grass: 0.32 kU/L — ABNORMAL HIGH
CAT DANDER: 0.11 kU/L — AB
Cockroach: <0.1 kU/L
Common Ragweed: <0.1 kU/L
DOG DANDER: 0.12 kU/L — AB
IGE (IMMUNOGLOBULIN E), SERUM: 87 kU/L (ref <115)
Johnson Grass: 0.17 kU/L — ABNORMAL HIGH
Pecan/Hickory Tree IgE: <0.1 kU/L
Timothy Grass: 0.38 kU/L — ABNORMAL HIGH

## 2015-11-01 NOTE — Progress Notes (Signed)
LMTCB

## 2015-11-01 NOTE — Telephone Encounter (Signed)
Michael B Wert, MD  Leslie M Raskin, CMA        Call pt: Reviewed cxr and no acute change so no change in recommendations made at ov  --- I spoke with patient about results and he verbalized understanding and had no questions. 

## 2015-11-01 NOTE — Telephone Encounter (Signed)
Patient calling back again to speak to Magda Paganini - states he will be available until 11:30am and then he will be in a meeting -pr

## 2015-11-04 NOTE — Progress Notes (Signed)
Spoke with pt and notified of results per Dr. Wert. Pt verbalized understanding and denied any questions. 

## 2015-11-06 ENCOUNTER — Telehealth: Payer: Self-pay | Admitting: Internal Medicine

## 2015-11-06 NOTE — Telephone Encounter (Signed)
asthmaex 100 2bid

## 2015-11-06 NOTE — Telephone Encounter (Signed)
Symbicort is not covered by pt's insurance. Covered alternatives are Advair, Arnuity, Flovent, Qvar, Asmanex or Breo.  MW - would you like [place] to start PA or change to covered alternative? Thanks.  **If PA is needed, the form that needs to be filled out is in the purple PA folder outside of triage.**

## 2015-11-06 NOTE — Telephone Encounter (Signed)
LVM for pt to return call

## 2015-11-07 NOTE — Telephone Encounter (Signed)
Pt called back and in the middle of advising pt recs, line d/c'd. Called back x 2 and line rings busy. Will await call back.

## 2015-11-08 MED ORDER — MOMETASONE FUROATE 100 MCG/ACT IN AERO: 2.0000 | INHALATION_SPRAY | Freq: Two times a day (BID) | RESPIRATORY_TRACT | 6 refills | Status: DC

## 2015-11-08 NOTE — Telephone Encounter (Signed)
Spoke with pharmacist and gave verbal for change of medication This has been updated on medication list. Nothing further needed.

## 2015-11-08 NOTE — Telephone Encounter (Signed)
Ashtyn spoke to pharmacist at [facility] gave verbal.  Will send to Ashtyn to document Rx.

## 2015-11-20 DIAGNOSIS — E7800 Pure hypercholesterolemia, unspecified: Secondary | ICD-10-CM | POA: Diagnosis not present

## 2015-11-20 DIAGNOSIS — Z23 Encounter for immunization: Secondary | ICD-10-CM | POA: Diagnosis not present

## 2015-11-20 DIAGNOSIS — I519 Heart disease, unspecified: Secondary | ICD-10-CM | POA: Diagnosis not present

## 2015-11-20 DIAGNOSIS — J45909 Unspecified asthma, uncomplicated: Secondary | ICD-10-CM | POA: Diagnosis not present

## 2015-11-20 DIAGNOSIS — E119 Type 2 diabetes mellitus without complications: Secondary | ICD-10-CM | POA: Diagnosis not present

## 2015-11-20 DIAGNOSIS — I639 Cerebral infarction, unspecified: Secondary | ICD-10-CM | POA: Diagnosis not present

## 2015-11-20 DIAGNOSIS — Z0000 Encounter for general adult medical examination without abnormal findings: Secondary | ICD-10-CM | POA: Diagnosis not present

## 2015-11-20 DIAGNOSIS — Z1389 Encounter for screening for other disorder: Secondary | ICD-10-CM | POA: Diagnosis not present

## 2015-11-20 DIAGNOSIS — I10 Essential (primary) hypertension: Secondary | ICD-10-CM | POA: Diagnosis not present

## 2015-11-25 ENCOUNTER — Ambulatory Visit (INDEPENDENT_AMBULATORY_CARE_PROVIDER_SITE_OTHER): Payer: Medicare HMO | Admitting: Internal Medicine

## 2015-11-25 ENCOUNTER — Encounter: Payer: Self-pay | Admitting: Internal Medicine

## 2015-11-25 VITALS — BP 132/80 | HR 49 | Temp 97.9°F | Ht 70.5 in | Wt 166.2 lb

## 2015-11-25 DIAGNOSIS — J45991 Cough variant asthma: Secondary | ICD-10-CM | POA: Diagnosis not present

## 2015-11-25 LAB — NITRIC OXIDE: Nitric Oxide: 70

## 2015-11-25 MED ORDER — FAMOTIDINE 20 MG PO TABS: ORAL_TABLET | ORAL | Status: DC

## 2015-11-25 MED ORDER — PANTOPRAZOLE SODIUM 40 MG PO TBEC: 40.0000 mg | DELAYED_RELEASE_TABLET | Freq: Every day | ORAL | 2 refills | Status: DC

## 2015-11-25 MED ORDER — PREDNISONE 10 MG PO TABS: ORAL_TABLET | ORAL | 0 refills | Status: DC

## 2015-11-25 MED ORDER — AMOXICILLIN-POT CLAVULANATE 875-125 MG PO TABS: 1.0000 | ORAL_TABLET | Freq: Two times a day (BID) | ORAL | 0 refills | Status: AC

## 2015-11-25 NOTE — Assessment & Plan Note (Addendum)
Cough w/u 2003   GERD > NF done 03/2002  Recurrent cough 06/2005 Pos sinusitis  FENO 10/31/2015  =   73  p last prednisone x sev weeks while on advair  - spirometry 10/31/2015 wnl including fef 25-75  p am advair   - Allergy profile 10/31/2015 >  Eos 1.2 /  IgE 87 with POS RAST for mild grass / dog cat  - 10/31/2015   try symbicort 80 2bid plus singulair    - 11/25/2015 rx with augmentin x 10 days/ pred x 6 > then sinus CT in 2 weeks   - 11/25/2015  After extensive coaching HFA effectiveness =    50%    DDX of  difficult airways management almost all start with A and  include Adherence, Ace Inhibitors, Acid Reflux, Active Sinus Disease, Alpha 1 Antitripsin deficiency, Anxiety masquerading as Airways dz,  ABPA,  Allergy(esp in young), Aspiration (esp in elderly), Adverse effects of meds,  Active smokers, A bunch of PE's (a small clot burden can't cause this syndrome unless there is already severe underlying pulm or vascular dz with poor reserve) plus two Bs  = Bronchiectasis and Beta blocker use..and one C= CHF   In this case Adherence is the biggest issue and starts with  inability to use HFA effectively and also  understand that SABA treats the symptoms but doesn't get to the underlying problem (inflammation).  I used  the analogy of putting steroid cream on a rash to help explain the meaning of topical therapy and the need to get the drug to the target tissue.    ? Acid (or non-acid) GERD > always difficult to exclude as up to 75% of pts in some series report no assoc GI/ Heartburn symptoms> rec max (24h)  acid suppression and diet restrictions/ reviewed and instructions given in writing.   ? Active sinus dz > augmentin x 10 days then sinus ct then ? ent eval   ? Allergic dz > Prednisone 10 mg take  4 each am x 2 days,   2 each am x 2 days,  1 each am x 2 days and stop    I had an extended discussion with the patient reviewing all relevant studies completed to date and  lasting 15 to 20 minutes of  a 25 minute visit    Each maintenance medication was reviewed in detail including most importantly the difference between maintenance and prns and under what circumstances the prns are to be triggered using an action plan format that is not reflected in the computer generated alphabetically organized AVS.    Please see instructions for details which were reviewed in writing and the patient given a copy highlighting the part that I personally wrote and discussed at today's ov.

## 2015-11-25 NOTE — Patient Instructions (Addendum)
Pantoprazole (protonix) 40 mg   Take  30-60 min before first meal of the day and Pepcid (famotidine)  20 mg one @  bedtime until return to office - this is the best way to tell whether stomach acid is contributing to your problem.    GERD (REFLUX)  is an extremely common cause of respiratory symptoms just like yours , many times with no obvious heartburn at all.    It can be treated with medication, but also with lifestyle changes including elevation of the head of your bed (ideally with 6 inch  bed blocks),  Smoking cessation, avoidance of late meals, excessive alcohol, and avoid fatty foods, chocolate, peppermint, colas, red wine, and acidic juices such as orange juice.  NO MINT OR MENTHOL PRODUCTS SO NO COUGH DROPS   USE SUGARLESS CANDY INSTEAD (Jolley ranchers or Stover's or Life Savers) or even ice chips will also do - the key is to swallow to prevent all throat clearing. NO OIL BASED VITAMINS - use powdered substitutes.    Work on inhaler technique:  relax and gently blow all the way out then take a nice smooth deep breath back in, triggering the inhaler at same time you start breathing in.  Hold for up to 5 seconds if you can. Blow out thru nose. Rinse and gargle with water when done    Plan A = Automatic = Asmanex Take 2 puffs first thing in am and then another 2 puffs about 12 hours later.      Plan B = Backup Only use your albuterol (proair) as a rescue medication to be used if you can't catch your breath by resting or doing a relaxed purse lip breathing pattern.  - The less you use it, the better it will work when you need it. - Ok to use the inhaler up to 2 puffs  every 4 hours if you must but call for appointment if use goes up over your usual need - Don't leave home without it !!  (think of it like the spare tire for your car)   Please see patient coordinator before you leave today  to schedule sinus CT in 2 weeks     Please schedule a follow up visit in 3 weeks but call  sooner if needed

## 2015-11-25 NOTE — Progress Notes (Signed)
Subjective:    Patient ID: [name], male    DOB: [birth year],    MRN: [id number]    Brief patient profile:  77 yowm never smoker with intermittent cough x early 2000's  maint on advair since shortly p onset and only better for 6-8 weeks cylcles  then requires rx with prednisone referred to pulmonary clinic 10/31/2015 by Dr Delfina Redwood.    History of Present Illness  10/31/2015 1st [facility] office visit/ Kyri Shader   Chief Complaint  Patient presents with  . Pulmonary Consult    Referred by Dr. Delfina Redwood. Pt c/o "ongoing problem with head colds" for the past 12-15 yrs off and on. He states his "head congestion" has become worse over the past 2 yrs. He recently took prednisone and feels "great today".    sinus surgery by Lucia Gaskins x 12 y prior to [place] "didn't help"   And maint on advair with last flare in August 2017  But tendency is for nasal congestion and drainage go down to chest and stay that way until gets another round of prednisone.  Better presently/ no cough/ congestion/ not maint on flonase but now on advair hfa (denies ever getting the mdi but that's not consitent with hx of on it since early 2000s)  rec Plan A = Automatic = stop advair and start symbicort 80 Take 2 puffs first thing in am and then another 2 puffs about 12 hours later Plus start singulair 10 mg each am  Work on inhaler technique:   Plan B = Backup Only use your albuterol as a rescue medication  Please remember to go to the lab and x-ray department downstairs for your tests - we will call you with the results when they are available. Please schedule a follow up office visit in 6 weeks, call sooner if needed - bring all medications with you> did not do so      11/25/2015  f/u ov/[place] January re:   Recurrent cough - this particular flare started in August  2017, previous responded to prednisone x sev months  And maint on symb> insurance changes to  asmanex though hfa poor / did not bring meds  Chief Complaint  Patient  presents with  . Acute Visit    Pt c/o cough with brown sputum for the past wk. He also c/o increased DOE, he gets SOB walking short distances or when he gets in a hurry. He has had night sweats a few times.   pattern of temporary improvement x weeks then relapse with lots of nasal symptoms first then worse cough/ congestion esp at hs despite maint rx on asmanex and prior surgery for gerd and sinus dz noted - better temporarily (up to 3 h) p saba despite poor hfa   No obvious day to day or daytime variability or assoc   mucus plugs or hemoptysis or cp or chest tightness, subjective wheeze or overt  hb symptoms. No unusual exp hx or h/o childhood pna/ asthma or knowledge of premature birth.  Sleeping ok without nocturnal  or early am exacerbation  of respiratory  c/o's or need for noct saba. Also denies any obvious fluctuation of symptoms with weather or environmental changes or other aggravating or alleviating factors except as outlined above   Current Medications, Allergies, Complete Past Medical History, Past Surgical History, Family History, and Social History were reviewed in [employer] record.  ROS  The following are not active complaints unless bolded sore throat, dysphagia, dental problems,  itching, sneezing,  nasal congestion or excess/ purulent secretions, ear ache,   fever, chills, sweats, unintended wt loss, classically pleuritic or exertional cp,  orthopnea pnd or leg swelling, presyncope, palpitations, abdominal pain, anorexia, nausea, vomiting, diarrhea  or change in bowel or bladder habits, change in stools or urine, dysuria,hematuria,  rash, arthralgias, visual complaints, headache, numbness, weakness or ataxia or problems with walking or coordination,  change in mood/affect or memory.                     Objective:   Physical Exam  amb anxious wm nad   11/25/2015      166   10/31/15 167 lb (75.8 kg)  07/25/15 162 lb 6.4 oz (73.7 kg)  07/25/15 160 lb  (72.6 kg)    Vital signs reviewed   HEENT: nl dentition,   and oropharynx. Nl external ear canals with nl light reflex/ no cough on insertion of scope - moderate bilateral non-specific turbinate edema     NECK :  without JVD/Nodes/TM/ nl carotid upstrokes bilaterally   LUNGS: no acc muscle use,  Nl contour chest and insp/ exp rhonchi bilaterally/  exp  Cough    CV:  RRR  no s3 or murmur or increase in P2, no edema   ABD:  soft and nontender with nl inspiratory excursion in the supine position. No bruits or organomegaly, bowel sounds nl  MS:  Nl gait/ ext warm without deformities, calf tenderness, cyanosis or clubbing No obvious joint restrictions   SKIN: warm and dry without lesions    NEURO:  alert, approp, nl sensorium with  no motor deficits     CXR PA and Lateral:   10/31/2015 :    I personally reviewed images and agree with radiology impression as follows:   No acute cardiopulmonary disease.  No change from the prior study.            Assessment & Plan:

## 2015-11-26 ENCOUNTER — Encounter: Payer: Self-pay | Admitting: Internal Medicine

## 2015-12-10 ENCOUNTER — Ambulatory Visit (HOSPITAL_BASED_OUTPATIENT_CLINIC_OR_DEPARTMENT_OTHER)
Admission: RE | Admit: 2015-12-10 | Discharge: 2015-12-10 | Disposition: A | Discharge status: Home / Self Care | Payer: Medicare HMO | Source: Ambulatory Visit | Attending: Internal Medicine | Admitting: Internal Medicine

## 2015-12-10 DIAGNOSIS — J45991 Cough variant asthma: Secondary | ICD-10-CM

## 2015-12-10 DIAGNOSIS — R05 Cough: Secondary | ICD-10-CM | POA: Diagnosis not present

## 2015-12-10 DIAGNOSIS — J3489 Other specified disorders of nose and nasal sinuses: Secondary | ICD-10-CM | POA: Insufficient documentation

## 2015-12-11 ENCOUNTER — Telehealth: Payer: Self-pay | Admitting: Internal Medicine

## 2015-12-11 DIAGNOSIS — D72829 Elevated white blood cell count, unspecified: Secondary | ICD-10-CM | POA: Diagnosis not present

## 2015-12-11 DIAGNOSIS — J45991 Cough variant asthma: Secondary | ICD-10-CM

## 2015-12-11 NOTE — Progress Notes (Signed)
LMTCB

## 2015-12-11 NOTE — Progress Notes (Signed)
Spoke with pt and notified of results per Dr. Wert. Pt verbalized understanding and denied any questions. 

## 2015-12-11 NOTE — Telephone Encounter (Signed)
I spoke with the pt and notified of results/recs per MW  He verbalized understanding  Order sent to [facility] for ENT eval

## 2015-12-12 ENCOUNTER — Encounter: Payer: Self-pay | Admitting: Internal Medicine

## 2015-12-12 ENCOUNTER — Ambulatory Visit: Payer: Medicare HMO | Admitting: Internal Medicine

## 2015-12-12 ENCOUNTER — Ambulatory Visit (INDEPENDENT_AMBULATORY_CARE_PROVIDER_SITE_OTHER): Payer: Medicare HMO | Admitting: Internal Medicine

## 2015-12-12 VITALS — BP 126/80 | HR 88 | Ht 70.5 in | Wt 169.0 lb

## 2015-12-12 DIAGNOSIS — J329 Chronic sinusitis, unspecified: Secondary | ICD-10-CM | POA: Diagnosis not present

## 2015-12-12 DIAGNOSIS — J45991 Cough variant asthma: Secondary | ICD-10-CM

## 2015-12-12 LAB — NITRIC OXIDE: NITRIC OXIDE: 47

## 2015-12-12 MED ORDER — MOMETASONE FURO-FORMOTEROL FUM 100-5 MCG/ACT IN AERO: 2.0000 | INHALATION_SPRAY | Freq: Two times a day (BID) | RESPIRATORY_TRACT | 0 refills | Status: DC

## 2015-12-12 MED ORDER — PREDNISONE 10 MG PO TABS: ORAL_TABLET | ORAL | 0 refills | Status: DC

## 2015-12-12 MED ORDER — MOMETASONE FURO-FORMOTEROL FUM 100-5 MCG/ACT IN AERO: INHALATION_SPRAY | RESPIRATORY_TRACT | 11 refills | Status: DC

## 2015-12-12 NOTE — Assessment & Plan Note (Addendum)
Cough w/u 2003   GERD > NF done 03/2002  Recurrent cough 06/2005 Pos sinusitis  FENO 10/31/2015  =   73  p last prednisone x sev weeks while on advair  - spirometry 10/31/2015 wnl including fef 25-75  p am advair   - Allergy profile 10/31/2015 >  Eos 1.2 /  IgE 87 with POS RAST for mild grass / dog cat  - 10/31/2015   try symbicort 80 2bid plus singulair  - 11/25/2015 rx with augmentin x 10 days/ pred x 6 > then sinus CT in 2 weeks  > done 12/10/15 Mucosal thickening with almost complete opacification left maxillary sinus. Mucosal thickening right maxillary sinus and bilateral sphenoid sinus. Mucosal thickening bilateral ethmoid air cells. Mucosal thickening with complete opacification left frontal sinus. Mucosal thickening right frontal sinus> ENT referral   - 12/12/2015  After extensive coaching HFA effectiveness =    50% > changed to dulera 100 2bid - FENO 12/12/2015  =  49 On asthmanex 100 2bid though hfa baseline only 25% effective technique  I had an extended discussion with the patient reviewing all relevant studies completed to date and  lasting 15 to 20 minutes of a 25 minute visit on the following ongoing concerns:   1) clearly has asthma and not controlling longterm on just ICS but may be largely due to unresolved sinusitis and suboptimal hfa both addressed today  2) Each maintenance medication was reviewed in detail including most importantly the difference between maintenance and as needed and under what circumstances the prns are to be used.  Please see instructions for details which were reviewed in writing and the patient given a copy.

## 2015-12-12 NOTE — Progress Notes (Signed)
Subjective:    Patient ID: [name], male    DOB: November 24, 1931,    MRN: [id number]    Brief patient profile:  36 yowm never smoker with intermittent cough x early 2000's  maint on advair since shortly p onset and only better for 6-8 weeks cylcles  then requires rx with prednisone referred to pulmonary clinic 10/31/2015 by Dr Delfina Redwood.    History of Present Illness  10/31/2015 1st [facility] office visit/ Mariavictoria Nottingham   Chief Complaint  Patient presents with  . Pulmonary Consult    Referred by Dr. Delfina Redwood. Pt c/o "ongoing problem with head colds" for the past 12-15 yrs off and on. He states his "head congestion" has become worse over the past 2 yrs. He recently took prednisone and feels "great today".    sinus surgery by Lucia Gaskins x 12 y prior to [place] "didn't help"   And maint on advair with last flare in August 2017  But tendency is for nasal congestion and drainage go down to chest and stay that way until gets another round of prednisone.  Better presently/ no cough/ congestion/ not maint on flonase but now on advair hfa (denies ever getting the mdi but that's not consitent with hx of on it since early 2000s)  rec Plan A = Automatic = stop advair and start symbicort 80 Take 2 puffs first thing in am and then another 2 puffs about 12 hours later Plus start singulair 10 mg each am  Work on inhaler technique:   Plan B = Backup Only use your albuterol as a rescue medication  Please remember to go to the lab and x-ray department downstairs for your tests - we will call you with the results when they are available. Please schedule a follow up office visit in 6 weeks, call sooner if needed - bring all medications with you> did not do so      11/25/2015  f/u ov/Xaviera Flaten re:   Recurrent cough - this particular flare started in August  2017, previous responded to prednisone x sev months  And maint on symb> insurance changes to  asmanex though hfa poor / did not bring meds  Chief Complaint  Patient  presents with  . Acute Visit    Pt c/o cough with brown sputum for the past wk. He also c/o increased DOE, he gets SOB walking short distances or when he gets in a hurry. He has had night sweats a few times.   pattern of temporary improvement x weeks then relapse with lots of nasal symptoms first then worse cough/ congestion esp at hs despite maint rx on asmanex and prior surgery for gerd and sinus dz noted - better temporarily (up to 3 h) p saba despite poor hfa  rec Pantoprazole (protonix) 40 mg   Take  30-60 min before first meal of the day and Pepcid (famotidine)  20 mg one @  bedtime until return to office   GERD diet  Work on inhaler technique:   Plan A = Automatic = Asmanex Take 2 puffs first thing in am and then another 2 puffs about 12 hours later.  Plan B = Backup Only use your albuterol (proair) as a rescue medication  schedule sinus CT in 2 weeks>  Chronic sinusitis 12/10/15    12/12/2015  f/u ov/Keyvin Rison re: cough variant asthma/ sinusitis/ improved on pred last rx 12/01/15 on asthmanex 100 2bid  Chief Complaint  Patient presents with  . Follow-up    Cough has  improved some. First thing in the am he coughs up some yellow sputum.    Not limited by breathing from desired activities  / hfa still very poor  No need for saba  At all / did not bring asmanex   No obvious day to day or daytime variability or assoc   mucus plugs or hemoptysis or cp or chest tightness, subjective wheeze or overt  hb symptoms. No unusual exp hx or h/o childhood pna/ asthma or knowledge of premature birth.  Sleeping ok without nocturnal  or early am exacerbation  of respiratory  c/o's or need for noct saba. Also denies any obvious fluctuation of symptoms with weather or environmental changes or other aggravating or alleviating factors except as outlined above   Current Medications, Allergies, Complete Past Medical History, Past Surgical History, Family History, and Social History were reviewed in [employer] record.  ROS  The following are not active complaints unless bolded sore throat, dysphagia, dental problems, itching, sneezing,  nasal congestion or excess/ purulent secretions, ear ache,   fever, chills, sweats, unintended wt loss, classically pleuritic or exertional cp,  orthopnea pnd or leg swelling, presyncope, palpitations, abdominal pain, anorexia, nausea, vomiting, diarrhea  or change in bowel or bladder habits, change in stools or urine, dysuria,hematuria,  rash, arthralgias, visual complaints, headache, numbness, weakness or ataxia or problems with walking or coordination,  change in mood/affect or memory.                     Objective:   Physical Exam  amb anxious wm nad   12/12/2015   11/25/2015      166   10/31/15 167 lb (75.8 kg)  07/25/15 162 lb 6.4 oz (73.7 kg)  07/25/15 160 lb (72.6 kg)    Vital signs reviewed   HEENT: nl dentition,   and oropharynx. Nl external ear canals with nl light reflex/ no cough on insertion of scope - moderate bilateral non-specific turbinate edema     NECK :  without JVD/Nodes/TM/ nl carotid upstrokes bilaterally   LUNGS: no acc muscle use,  Nl contour chest  With mid bilateral exp wheeze better with plm    CV:  RRR  no s3 or murmur or increase in P2, no edema   ABD:  soft and nontender with nl inspiratory excursion in the supine position. No bruits or organomegaly, bowel sounds nl  MS:  Nl gait/ ext warm without deformities, calf tenderness, cyanosis or clubbing No obvious joint restrictions   SKIN: warm and dry without lesions    NEURO:  alert, approp, nl sensorium with  no motor deficits       CXR PA and Lateral:   10/31/2015 :    I personally reviewed images and agree with radiology impression as follows:   No acute cardiopulmonary disease.  No change from the prior study.            Assessment & Plan:

## 2015-12-12 NOTE — Patient Instructions (Addendum)
Keep appt with Dr Redmond Baseman   Plan A = Automatic = stop the asmanex / change to dulera 100 Take 2 puffs first thing in am and then another 2 puffs about 12 hours later.    Plan B = Backup Only use your albuterol as a rescue medication to be used if you can't catch your breath by resting or doing a relaxed purse lip breathing pattern.  - The less you use it, the better it will work when you need it. - Ok to use the inhaler up to 2 puffs  every 4 hours if you must but call for appointment if use goes up over your usual need - Don't leave home without it !!  (think of it like the spare tire for your car)    Work on inhaler technique:  relax and gently blow all the way out then take a nice smooth deep breath back in, triggering the inhaler at same time you start breathing in.  Hold for up to 5 seconds if you can. Blow out thru nose. Rinse and gargle with water when done  Please schedule a follow up office visit in 6 weeks, call sooner if needed with all active medications in hand

## 2015-12-12 NOTE — Assessment & Plan Note (Addendum)
Dr Lucia Gaskins > sinus surgery around 2000 ? Benefit - - 11/25/2015 rx with augmentin x 10 days/ pred x 6 > then sinus CT in 2 weeks  > done 12/10/15 Mucosal thickening with almost complete opacification left maxillary sinus. Mucosal thickening right maxillary sinus and bilateral sphenoid sinus. Mucosal thickening bilateral ethmoid air cells. Mucosal thickening with complete opacification left frontal sinus. Mucosal thickening right frontal sinus> ENT referral > Dr Redmond Baseman to see Nov 2017

## 2015-12-16 ENCOUNTER — Telehealth: Payer: Self-pay | Admitting: Internal Medicine

## 2015-12-16 NOTE — Telephone Encounter (Signed)
MW please advise if you would like to switch to an alternative or proceed with  PA.  Thanks.

## 2015-12-16 NOTE — Telephone Encounter (Signed)
That's fine if his insurance covered it and he felt it was working - the main problem we detected was his techique was poor so it really doesn't matter so much which one he uses but rather how well he uses which one

## 2015-12-16 NOTE — Telephone Encounter (Signed)
Ok for advair 45 hfa  Take 2 puffs first thing in am and then another 2 puffs about 12 hours later.

## 2015-12-16 NOTE — Telephone Encounter (Signed)
PT states that MW told him that if insurance didn't cover Dulera to go back on Asmanex through this calendar year.  Do you still want to switch to Advair or go back to Asmanex?   Pt states he was Advair in the past and MW had wanted to switch him to something newer.  Please advise

## 2015-12-16 NOTE — Telephone Encounter (Signed)
Spoke with pt and he states he will stay on Asmanex for now.  Nothing further needed at this time.

## 2015-12-17 DIAGNOSIS — E119 Type 2 diabetes mellitus without complications: Secondary | ICD-10-CM | POA: Diagnosis not present

## 2015-12-17 DIAGNOSIS — H43813 Vitreous degeneration, bilateral: Secondary | ICD-10-CM | POA: Diagnosis not present

## 2015-12-17 DIAGNOSIS — H35372 Puckering of macula, left eye: Secondary | ICD-10-CM | POA: Diagnosis not present

## 2015-12-17 DIAGNOSIS — Z961 Presence of intraocular lens: Secondary | ICD-10-CM | POA: Diagnosis not present

## 2015-12-17 DIAGNOSIS — H43812 Vitreous degeneration, left eye: Secondary | ICD-10-CM | POA: Diagnosis not present

## 2015-12-23 DIAGNOSIS — R69 Illness, unspecified: Secondary | ICD-10-CM | POA: Diagnosis not present

## 2015-12-23 DIAGNOSIS — J324 Chronic pansinusitis: Secondary | ICD-10-CM | POA: Diagnosis not present

## 2015-12-23 DIAGNOSIS — R05 Cough: Secondary | ICD-10-CM | POA: Diagnosis not present

## 2015-12-31 DIAGNOSIS — L821 Other seborrheic keratosis: Secondary | ICD-10-CM | POA: Diagnosis not present

## 2015-12-31 DIAGNOSIS — D044 Carcinoma in situ of skin of scalp and neck: Secondary | ICD-10-CM | POA: Diagnosis not present

## 2015-12-31 DIAGNOSIS — D485 Neoplasm of uncertain behavior of skin: Secondary | ICD-10-CM | POA: Diagnosis not present

## 2015-12-31 DIAGNOSIS — L570 Actinic keratosis: Secondary | ICD-10-CM | POA: Diagnosis not present

## 2015-12-31 DIAGNOSIS — Z85828 Personal history of other malignant neoplasm of skin: Secondary | ICD-10-CM | POA: Diagnosis not present

## 2016-01-13 DIAGNOSIS — H109 Unspecified conjunctivitis: Secondary | ICD-10-CM | POA: Diagnosis not present

## 2016-01-21 DIAGNOSIS — H00025 Hordeolum internum left lower eyelid: Secondary | ICD-10-CM | POA: Diagnosis not present

## 2016-01-21 DIAGNOSIS — R430 Anosmia: Secondary | ICD-10-CM | POA: Diagnosis not present

## 2016-01-21 DIAGNOSIS — R490 Dysphonia: Secondary | ICD-10-CM | POA: Diagnosis not present

## 2016-01-21 DIAGNOSIS — J324 Chronic pansinusitis: Secondary | ICD-10-CM | POA: Diagnosis not present

## 2016-01-21 DIAGNOSIS — H93293 Other abnormal auditory perceptions, bilateral: Secondary | ICD-10-CM | POA: Diagnosis not present

## 2016-01-21 DIAGNOSIS — R69 Illness, unspecified: Secondary | ICD-10-CM | POA: Diagnosis not present

## 2016-01-21 DIAGNOSIS — H6502 Acute serous otitis media, left ear: Secondary | ICD-10-CM | POA: Diagnosis not present

## 2016-01-21 DIAGNOSIS — J343 Hypertrophy of nasal turbinates: Secondary | ICD-10-CM | POA: Diagnosis not present

## 2016-01-23 ENCOUNTER — Ambulatory Visit: Payer: Medicare HMO | Admitting: Internal Medicine

## 2016-01-28 DIAGNOSIS — H00025 Hordeolum internum left lower eyelid: Secondary | ICD-10-CM | POA: Diagnosis not present

## 2016-01-29 ENCOUNTER — Telehealth: Payer: Self-pay | Admitting: Gastroenterology

## 2016-01-29 NOTE — Telephone Encounter (Signed)
Pt has had 1 BRB rectal bleeding episode. No bleeding at this time. No fever or abd pain. He was last seen here several years ago.  He was advised to follow up with PCP and an appt has been made with Dr Ardis Hughs for 03/17/16.  He will call back if he can not get in with PCP or if the bleeding returns and is heavy he will go to the ED.

## 2016-01-29 NOTE — Telephone Encounter (Signed)
Patient calling back regarding this.  °

## 2016-01-30 ENCOUNTER — Encounter (HOSPITAL_COMMUNITY): Payer: Self-pay

## 2016-01-30 ENCOUNTER — Inpatient Hospital Stay (HOSPITAL_COMMUNITY)
Admission: EM | Admit: 2016-01-30 | Discharge: 2016-02-01 | DRG: 379 | Disposition: A | Discharge status: Home / Self Care | Payer: MEDICARE | Attending: Internal Medicine | Admitting: Internal Medicine

## 2016-01-30 DIAGNOSIS — Z79899 Other long term (current) drug therapy: Secondary | ICD-10-CM

## 2016-01-30 DIAGNOSIS — K219 Gastro-esophageal reflux disease without esophagitis: Secondary | ICD-10-CM | POA: Diagnosis present

## 2016-01-30 DIAGNOSIS — J449 Chronic obstructive pulmonary disease, unspecified: Secondary | ICD-10-CM | POA: Diagnosis present

## 2016-01-30 DIAGNOSIS — E119 Type 2 diabetes mellitus without complications: Secondary | ICD-10-CM | POA: Diagnosis not present

## 2016-01-30 DIAGNOSIS — K5791 Diverticulosis of intestine, part unspecified, without perforation or abscess with bleeding: Secondary | ICD-10-CM | POA: Diagnosis not present

## 2016-01-30 DIAGNOSIS — J45991 Cough variant asthma: Secondary | ICD-10-CM | POA: Diagnosis not present

## 2016-01-30 DIAGNOSIS — I4891 Unspecified atrial fibrillation: Secondary | ICD-10-CM | POA: Diagnosis not present

## 2016-01-30 DIAGNOSIS — I493 Ventricular premature depolarization: Secondary | ICD-10-CM | POA: Diagnosis not present

## 2016-01-30 DIAGNOSIS — Z86010 Personal history of colonic polyps: Secondary | ICD-10-CM

## 2016-01-30 DIAGNOSIS — K922 Gastrointestinal hemorrhage, unspecified: Secondary | ICD-10-CM | POA: Diagnosis not present

## 2016-01-30 DIAGNOSIS — Z8719 Personal history of other diseases of the digestive system: Secondary | ICD-10-CM | POA: Diagnosis present

## 2016-01-30 DIAGNOSIS — K921 Melena: Secondary | ICD-10-CM

## 2016-01-30 DIAGNOSIS — E785 Hyperlipidemia, unspecified: Secondary | ICD-10-CM | POA: Diagnosis present

## 2016-01-30 DIAGNOSIS — Z9081 Acquired absence of spleen: Secondary | ICD-10-CM

## 2016-01-30 DIAGNOSIS — Z809 Family history of malignant neoplasm, unspecified: Secondary | ICD-10-CM | POA: Diagnosis not present

## 2016-01-30 DIAGNOSIS — Z7901 Long term (current) use of anticoagulants: Secondary | ICD-10-CM

## 2016-01-30 DIAGNOSIS — K5790 Diverticulosis of intestine, part unspecified, without perforation or abscess without bleeding: Secondary | ICD-10-CM | POA: Diagnosis not present

## 2016-01-30 DIAGNOSIS — K649 Unspecified hemorrhoids: Secondary | ICD-10-CM | POA: Diagnosis not present

## 2016-01-30 DIAGNOSIS — Z8673 Personal history of transient ischemic attack (TIA), and cerebral infarction without residual deficits: Secondary | ICD-10-CM | POA: Diagnosis not present

## 2016-01-30 HISTORY — DX: Cerebral infarction due to embolism of left middle cerebral artery: I63.412

## 2016-01-30 HISTORY — DX: Cerebral infarction, unspecified: I63.9

## 2016-01-30 LAB — PROTIME-INR
INR: 1.82
PROTHROMBIN TIME: 21.3 s — AB (ref 11.4–15.2)

## 2016-01-30 LAB — COMPREHENSIVE METABOLIC PANEL
ALBUMIN: 3.6 g/dL (ref 3.5–5.0)
ALK PHOS: 72 U/L (ref 38–126)
ALT: 16 U/L — ABNORMAL LOW (ref 17–63)
ANION GAP: 7 (ref 5–15)
AST: 22 U/L (ref 15–41)
BUN: 13 mg/dL (ref 6–20)
CALCIUM: 8.9 mg/dL (ref 8.9–10.3)
CHLORIDE: 105 mmol/L (ref 101–111)
CO2: 27 mmol/L (ref 22–32)
Creatinine, Ser: 0.78 mg/dL (ref 0.61–1.24)
GFR calc Af Amer: >60 mL/min (ref >60)
GFR calc non Af Amer: >60 mL/min (ref >60)
GLUCOSE: 97 mg/dL (ref 65–99)
POTASSIUM: 4 mmol/L (ref 3.5–5.1)
SODIUM: 139 mmol/L (ref 135–145)
Total Bilirubin: 0.6 mg/dL (ref 0.3–1.2)
Total Protein: 6.4 g/dL — ABNORMAL LOW (ref 6.5–8.1)

## 2016-01-30 LAB — CBC
HEMATOCRIT: 45.8 % (ref 39.0–52.0)
HEMOGLOBIN: 15 g/dL (ref 13.0–17.0)
MCH: 31 pg (ref 26.0–34.0)
MCHC: 32.8 g/dL (ref 30.0–36.0)
MCV: 94.6 fL (ref 78.0–100.0)
Platelets: 383 10*3/uL (ref 150–400)
RBC: 4.84 MIL/uL (ref 4.22–5.81)
RDW: 14.8 % (ref 11.5–15.5)
WBC: 10.8 10*3/uL — ABNORMAL HIGH (ref 4.0–10.5)

## 2016-01-30 LAB — GLUCOSE, CAPILLARY: GLUCOSE-CAPILLARY: 85 mg/dL (ref 65–99)

## 2016-01-30 LAB — TYPE AND SCREEN
ABO/RH(D): O POS
Antibody Screen: NEGATIVE

## 2016-01-30 LAB — POC OCCULT BLOOD, ED: FECAL OCCULT BLD: POSITIVE — AB

## 2016-01-30 MED ORDER — ACETAMINOPHEN 325 MG PO TABS: 650.0000 mg | ORAL_TABLET | Freq: Four times a day (QID) | ORAL | Status: DC | PRN

## 2016-01-30 MED ORDER — ACETAMINOPHEN 650 MG RE SUPP: 650.0000 mg | Freq: Four times a day (QID) | RECTAL | Status: DC | PRN

## 2016-01-30 MED ORDER — MONTELUKAST SODIUM 10 MG PO TABS
10.0000 mg | ORAL_TABLET | Freq: Every day | ORAL | Status: DC
  Administered 2016-01-30 – 2016-01-31 (×2): 10 mg via ORAL
  Filled 2016-01-30 (×2): qty 1

## 2016-01-30 MED ORDER — ONDANSETRON HCL 4 MG/2ML IJ SOLN: 4.0000 mg | Freq: Four times a day (QID) | INTRAMUSCULAR | Status: DC | PRN

## 2016-01-30 MED ORDER — ZOLPIDEM TARTRATE 5 MG PO TABS
5.0000 mg | ORAL_TABLET | Freq: Every evening | ORAL | Status: DC | PRN
Start: 2016-01-30 — End: 2016-02-01
  Administered 2016-01-30 – 2016-01-31 (×2): 5 mg via ORAL
  Filled 2016-01-30 (×2): qty 1

## 2016-01-30 MED ORDER — FLUTICASONE PROPIONATE 50 MCG/ACT NA SUSP
2.0000 | Freq: Every day | NASAL | Status: DC
  Administered 2016-01-31 – 2016-02-01 (×2): 2 via NASAL
  Filled 2016-01-30: qty 16

## 2016-01-30 MED ORDER — MOMETASONE FURO-FORMOTEROL FUM 100-5 MCG/ACT IN AERO
2.0000 | INHALATION_SPRAY | Freq: Two times a day (BID) | RESPIRATORY_TRACT | Status: DC
  Administered 2016-01-30 – 2016-02-01 (×4): 2 via RESPIRATORY_TRACT
  Filled 2016-01-30: qty 8.8

## 2016-01-30 MED ORDER — DEXTROSE 5 % IV SOLN
10.0000 mg | Freq: Once | INTRAVENOUS | Status: AC
Start: 2016-01-30 — End: 2016-01-30
  Administered 2016-01-30: 10 mg via INTRAVENOUS
  Filled 2016-01-30: qty 1

## 2016-01-30 MED ORDER — LACTATED RINGERS IV SOLN
INTRAVENOUS | Status: DC
  Administered 2016-01-30 – 2016-01-31 (×2): via INTRAVENOUS

## 2016-01-30 MED ORDER — ONDANSETRON HCL 4 MG PO TABS: 4.0000 mg | ORAL_TABLET | Freq: Four times a day (QID) | ORAL | Status: DC | PRN

## 2016-01-30 MED ORDER — SIMVASTATIN 20 MG PO TABS
20.0000 mg | ORAL_TABLET | Freq: Every day | ORAL | Status: DC
  Administered 2016-01-31 – 2016-02-01 (×2): 20 mg via ORAL
  Filled 2016-01-30 (×2): qty 1

## 2016-01-30 NOTE — Progress Notes (Signed)
Called ER RN for report. Room ready.  

## 2016-01-30 NOTE — ED Triage Notes (Signed)
Pt reports he had an episode of bright red blood in his stool yesterday. He was told by his PCP today that if he had any more episodes of bleeding in the stool to come to the ED. Pt denies abd pain, n/v.

## 2016-01-30 NOTE — H&P (Signed)
History and Physical    [name] [id number] DOB: 04-May-1931 DOA: 01/30/2016  PCP: Kandice Hams, MD Consultants:  Prince Rome - pulmonary; Redmond Baseman - ENT; Wynonia Lawman - cardiology Patient coming from: home - lives with wife; NOK: wife, [phone]  Chief Complaint: GI bleed  HPI: [name] is a 82 y.o. male with medical history significant of CVA on Coumadin, HLD, DM presenting with likely diverticular bleed.  Yesterday AM about 0700, he was brushing teeth and noticed blood dripping down.  Got on toilet and "flooded the place."  Significant bleeding.  One additional BM with smaller amount of blood and then again later.  Called Dr. Ardis Hughs' office, suggested PCP appointment.  Saw PCP this AM and "things checked out pretty good."  Sent him home but told him to come to the ER if there is any additional blood.  Had a BM today with a small amount of blood in the stool with a clot of darker blood as well.  No dizziness, no SOB, no abdominal pain.  H/o bleed 10 1/2 years ago.  Does not think there was a c-scope at that time.  Spent the night and better the next day, so was discharged home.  Was told it may recur.  Last c-scope 2009 - diverticulosis, no polyps or cancer.  On Coumadin.  Trying to become therapeutic.  On Coumadin for CVAs.  Last CVA was 07/23/15.  Started on Coumadin in July or August.  ED Course: Per Dr. Rex Kras: Pt w/ 3 episodes of BRBPR yesterday, 1 small episode today, no associated pain. He was well-appearing with normal vital signs at presentation. No abdominal tenderness. He did have bright red blood on rectal exam. Labs show stable hemoglobin at 15, INR 1.8. Gave IV vitamin K. I am concerned about the presence of bright red blood on exam given his Coumadin use. Discussed with [facility], Dr. Havery Moros, who recommended clear liquid diet, tagged RBC study if pt begins to bleed again. Maryanna Shape will see pt in consultation. Discussed admission with [facility], Dr.  Lorin Mercy. I appreciate Dr. Havery Moros and Dr. Narda Amber assistance in the patient's care. Pt admitted to medicine for further care   Review of Systems: As per HPI; otherwise 10 point review of systems reviewed and negative.   Ambulatory Status:  Ambulates indepedently  Past Medical History:  Diagnosis Date  . Asthma   . Colitis   . Colon polyps   . Diabetes mellitus type 2, diet-controlled ([place]) 12/22/2013  . Diverticulosis   . GERD (gastroesophageal reflux disease)   . Hemorrhoids   . Hiatal hernia   . History of esophageal strciture   . HLD (hyperlipidemia)   . Osteoarthritis   . Proctitis   . Status post dilation of esophageal narrowing   . Stroke [facility])     Past Surgical History:  Procedure Laterality Date  . ABDOMINAL HERNIA REPAIR    . esophageal hernia    . INSERTION OF MESH  01/29/2012   Procedure: INSERTION OF MESH;  Surgeon: Gwenyth Ober, MD;  Location: [place];  Service: General;  Laterality: N/A;  . nissen    . SPLENECTOMY, TOTAL     nontraumatic rupture  . VENTRAL HERNIA REPAIR  01/29/2012    WITH MESH  . VENTRAL HERNIA REPAIR  01/29/2012   Procedure: HERNIA REPAIR VENTRAL ADULT;  Surgeon: Gwenyth Ober, MD;  Location: [place];  Service: General;  Laterality: N/A;  open recurrent ventral hernia repair with mesh  Social History   Social History  . Marital status: Married    Spouse name: [name]  . Number of children: 2  . Years of education: Bachelor's   Occupational History  . retired    Social History Main Topics  . Smoking status: Never Smoker  . Smokeless tobacco: Never Used  . Alcohol use 0.0 oz/week     Comment: 1 1/2 - 2 daily  . Drug use: No  . Sexual activity: Not on file   Other Topics Concern  . Not on file   Social History Narrative   Patient is married and has 2 children.   Patient is right handed.   Patient has Bachelor's degree.   Patient drinks 2 cups daily.    No Known Allergies  Family History  Problem Relation Age of Onset    . Cancer Father     Prior to Admission medications   Medication Sig Start Date End Date Taking? Authorizing Provider  fluticasone (FLONASE) 50 MCG/ACT nasal spray Place 2 sprays into both nostrils daily. 06/18/14  Yes Historical Provider, MD  loratadine (CLARITIN) 10 MG tablet Take 10 mg by mouth daily.   Yes Historical Provider, MD  mometasone-formoterol (DULERA) 100-5 MCG/ACT AERO Inhale 2 puffs into the lungs 2 (two) times daily. 12/12/15  Yes Tanda Rockers, MD  montelukast (SINGULAIR) 10 MG tablet Take 1 tablet (10 mg total) by mouth at bedtime. 10/31/15  Yes Tanda Rockers, MD  simvastatin (ZOCOR) 20 MG tablet Take 20 mg by mouth daily.   Yes Historical Provider, MD  famotidine (PEPCID) 20 MG tablet One at bedtime Patient not taking: Reported on 01/30/2016 11/25/15   Tanda Rockers, MD  mometasone-formoterol [facility]) 100-5 MCG/ACT AERO Take 2 puffs first thing in am and then another 2 puffs about 12 hours later. Patient not taking: Reported on 01/30/2016 12/12/15   Tanda Rockers, MD  pantoprazole (PROTONIX) 40 MG tablet Take 1 tablet (40 mg total) by mouth daily. Take 30-60 min before first meal of the day Patient not taking: Reported on 01/30/2016 11/25/15   Tanda Rockers, MD  predniSONE (DELTASONE) 10 MG tablet Take  4 each am x 2 days,   2 each am x 2 days,  1 each am x 2 days and stop Patient not taking: Reported on 01/30/2016 12/12/15   Tanda Rockers, MD  warfarin (COUMADIN) 5 MG tablet Take 5 mg by mouth daily. Managed by [place] Provider, MD    Physical Exam: Vitals:   01/30/16 2030 01/30/16 2045 01/30/16 2110 01/30/16 2255  BP: 173/84 166/76 (!) 149/88   Pulse: 75 73 72   Resp: 17 24 20    Temp:   98.6 F (37 C)   TempSrc:      SpO2: 98% 98% 98% 98%  Weight:   72.6 kg (160 lb)   Height:   5' 10.5" (1.791 m)      General:  Appears calm and comfortable and is NAD Eyes:  PERRL, EOMI, normal lids, iris ENT:  grossly normal hearing, lips & tongue, mmm Neck:   no LAD, masses or thyromegaly Cardiovascular:  RRR, no m/r/g. No LE edema.  Respiratory:  CTA bilaterally, no w/r/r. Normal respiratory effort. Abdomen:  soft, ntnd, NABS Skin:  no rash or induration seen on limited exam Musculoskeletal:  grossly normal tone BUE/BLE, good ROM, no bony abnormality Psychiatric:  grossly normal mood and affect, speech fluent and appropriate, AOx3 Neurologic:  CN 2-12 grossly intact,  moves all extremities in coordinated fashion, sensation intact  Labs on Admission: I have personally reviewed following labs and imaging studies  CBC:  Recent Labs Lab 01/30/16 1616  WBC 10.8*  HGB 15.0  HCT 45.8  MCV 94.6  PLT [id number]   Basic Metabolic Panel:  Recent Labs Lab 01/30/16 1616  NA 139  K 4.0  CL 105  CO2 27  GLUCOSE 97  BUN 13  CREATININE 0.78  CALCIUM 8.9   GFR: Estimated Creatinine Clearance: 73.1 mL/min (by C-G formula based on SCr of 0.78 mg/dL). Liver Function Tests:  Recent Labs Lab 01/30/16 1616  AST 22  ALT 16*  ALKPHOS 72  BILITOT 0.6  PROT 6.4*  ALBUMIN 3.6   No results for input(s): LIPASE, AMYLASE in the last 168 hours. No results for input(s): AMMONIA in the last 168 hours. Coagulation Profile:  Recent Labs Lab 01/30/16 1616  INR 1.82   Cardiac Enzymes: No results for input(s): CKTOTAL, CKMB, CKMBINDEX, TROPONINI in the last 168 hours. BNP (last 3 results) No results for input(s): PROBNP in the last 8760 hours. HbA1C: No results for input(s): HGBA1C in the last 72 hours. CBG:  Recent Labs Lab 01/30/16 2123  GLUCAP 85   Lipid Profile: No results for input(s): CHOL, HDL, LDLCALC, TRIG, CHOLHDL, LDLDIRECT in the last 72 hours. Thyroid Function Tests: No results for input(s): TSH, T4TOTAL, FREET4, T3FREE, THYROIDAB in the last 72 hours. Anemia Panel: No results for input(s): VITAMINB12, FOLATE, FERRITIN, TIBC, IRON, RETICCTPCT in the last 72 hours. Urine analysis:    Component Value Date/Time   COLORURINE  YELLOW 07/25/2015 1200   APPEARANCEUR CLOUDY (A) 07/25/2015 1200   LABSPEC 1.015 07/25/2015 1200   PHURINE 7.0 07/25/2015 1200   GLUCOSEU NEGATIVE 07/25/2015 1200   HGBUR NEGATIVE 07/25/2015 1200   BILIRUBINUR NEGATIVE 07/25/2015 1200   KETONESUR NEGATIVE 07/25/2015 1200   PROTEINUR NEGATIVE 07/25/2015 1200   UROBILINOGEN 0.2 12/22/2013 1210   NITRITE NEGATIVE 07/25/2015 1200   LEUKOCYTESUR NEGATIVE 07/25/2015 1200    Creatinine Clearance: Estimated Creatinine Clearance: 73.1 mL/min (by C-G formula based on SCr of 0.78 mg/dL).  Sepsis Labs: @LABRCNTIP (procalcitonin:4,lacticidven:4) )No results found for this or any previous visit (from the past 240 hour(s)).   Radiological Exams on Admission: No results found.  EKG: Not done  Assessment/Plan Principal Problem:   Lower GI bleeding Active Problems:   Diabetes mellitus type 2, diet-controlled (HCC)   Chronic anticoagulation   GI bleed -Very likely diverticular bleed -Painless bleeding, 1 large episode followed by several smaller episodes -Continues to have bright red blood on rectal exam -Asymptomatic - no GI discomfort but also no dizziness, light-headedness, SOB, chest pain... -Complicated by need for anticoagulation (see below) -Will admit -Normal/high Hgb now, will trend; transfuse for Hgb <7 -Continue to monitory for recurrent bleeding - if present will order tagged RBC scan as per GI -GI to see patient in AM; based on last C-scope in 2009, patient would likely benefit from repeat colonoscopic evaluation -Clear liquid diet for now  Anticoagulation -Patient with CVA with plan for anticoagulation -Coumadin was subtherapeutic at the time of admission but the patient still bled -Was given vitamin K in the ER -Will hold all anticoagulation for now -Suggest discussion with cardiology/GI regarding when/if to resume anticoagulation    DVT prophylaxis: SCDs Code Status: Full - confirmed with patient/family Family  Communication: Wife present throughout evaluation  Disposition Plan:  Home once clinically improved Consults called: GI  Admission status: Admit - It is my  clinical opinion that admission to INPATIENT is reasonable and necessary because this patient will require at least 2 midnights in the hospital to treat this condition based on the medical complexity of the problems presented.  Given the aforementioned information, the predictability of an adverse outcome is felt to be significant.     Karmen Bongo MD [facility]  If 7PM-7AM, please contact night-coverage [email or web address] Password [email or web address]  01/31/2016, 1:31 AM

## 2016-01-30 NOTE — ED Provider Notes (Signed)
[facility] DEPT Provider Note   CSN: [id number] Arrival date & time: 01/30/16 1553     History    Chief Complaint  Patient presents with  . GI Bleeding     HPI [name] is a 82 y.o. male.  82yo M w/ PMH including A fib on coumadin, COPD, CVA, diverticulosis who presents with GI bleeding. Yesterday, the patient went to the bathroom and had a large bowel movement consisting of bright red blood that filled the toilet. He had 2 smaller episodes of bloody bowel movements later in the day yesterday. He saw his PCP today, who checked his INR and instructed to report to the ED if he had any further episodes of bleeding. After he left, he had one small episode where wife states that he passed just a clot of blood. He currently denies any complaints including no chest pain, shortness of breath, abdominal pain, rectal pain, fevers, vomiting, or recent illness. He denies history of chronic constipation. He had this problem approximately 10 years ago which was thought to be related to diverticulosis but has never had problems since then.   Past Medical History:  Diagnosis Date  . Asthma   . Colitis   . Colon polyps   . COPD (chronic obstructive pulmonary disease) ([place])   . Diabetes mellitus type 2, diet-controlled ([place]) 12/22/2013  . Diverticulosis   . Dyslipidemia 06/24/2007  . GERD (gastroesophageal reflux disease)   . Hemorrhoids   . Hiatal hernia   . History of esophageal strciture   . HLD (hyperlipidemia)   . Osteoarthritis   . Proctitis   . Seizure ([place])   . Status post dilation of esophageal narrowing   . Stroke [facility])      Patient Active Problem List   Diagnosis Date Noted  . Lower GI bleeding 01/30/2016  . Sinusitis, chronic 12/12/2015  . Cough variant asthma 10/31/2015  . Acute CVA (cerebrovascular accident) ([place]) 07/25/2015  . Insomnia 07/25/2015  . COPD bronchitis 07/25/2015  . Diabetes mellitus with complication ([place])   . Type 2 diabetes mellitus without  complication ([place]) [id number]  . Cerebral infarction due to embolism of left middle cerebral artery ([place]) 02/03/2014  . HLD (hyperlipidemia) 02/03/2014  . Ulnar neuropathy at elbow of right upper extremity 02/03/2014  . Cervical radiculopathy 02/03/2014  . History of lower GI bleeding 01/29/2014  . Seizures ([place])   . Diabetes mellitus type 2, diet-controlled ([place]) 12/22/2013  . History of TIA   . Recurrent ventral hernia 12/15/2011  . History of esophageal strciture   . Dyslipidemia 06/24/2007  . STROKE 06/24/2007    Past Surgical History:  Procedure Laterality Date  . ABDOMINAL HERNIA REPAIR    . esophageal hernia    . INSERTION OF MESH  01/29/2012   Procedure: INSERTION OF MESH;  Surgeon: Gwenyth Ober, MD;  Location: [place];  Service: General;  Laterality: N/A;  . nissen    . SPLENECTOMY, TOTAL    . VENTRAL HERNIA REPAIR  01/29/2012    WITH MESH  . VENTRAL HERNIA REPAIR  01/29/2012   Procedure: HERNIA REPAIR VENTRAL ADULT;  Surgeon: Gwenyth Ober, MD;  Location: [place];  Service: General;  Laterality: N/A;  open recurrent ventral hernia repair with mesh        Home Medications    Prior to Admission medications   Medication Sig Start Date End Date Taking? Authorizing Provider  fluticasone (FLONASE) 50 MCG/ACT nasal spray Place 2 sprays into both nostrils daily. 06/18/14  Yes Historical Provider, MD  loratadine (CLARITIN) 10 MG tablet Take 10 mg by mouth daily.   Yes Historical Provider, MD  mometasone-formoterol (DULERA) 100-5 MCG/ACT AERO Inhale 2 puffs into the lungs 2 (two) times daily. 12/12/15  Yes Tanda Rockers, MD  montelukast (SINGULAIR) 10 MG tablet Take 1 tablet (10 mg total) by mouth at bedtime. 10/31/15  Yes Tanda Rockers, MD  simvastatin (ZOCOR) 20 MG tablet Take 20 mg by mouth daily.   Yes Historical Provider, MD  famotidine (PEPCID) 20 MG tablet One at bedtime Patient not taking: Reported on 01/30/2016 11/25/15   Tanda Rockers, MD  mometasone-formoterol  [facility]) 100-5 MCG/ACT AERO Take 2 puffs first thing in am and then another 2 puffs about 12 hours later. Patient not taking: Reported on 01/30/2016 12/12/15   Tanda Rockers, MD  pantoprazole (PROTONIX) 40 MG tablet Take 1 tablet (40 mg total) by mouth daily. Take 30-60 min before first meal of the day Patient not taking: Reported on 01/30/2016 11/25/15   Tanda Rockers, MD  predniSONE (DELTASONE) 10 MG tablet Take  4 each am x 2 days,   2 each am x 2 days,  1 each am x 2 days and stop Patient not taking: Reported on 01/30/2016 12/12/15   Tanda Rockers, MD  warfarin (COUMADIN) 5 MG tablet Take 5 mg by mouth daily. Managed by [place] Provider, MD      Family History  Problem Relation Age of Onset  . Cancer Father      Social History  Substance Use Topics  . Smoking status: Never Smoker  . Smokeless tobacco: Never Used  . Alcohol use 0.0 oz/week     Comment: 1 1/2 - 2 daily     Allergies     Patient has no known allergies.    Review of Systems  10 Systems reviewed and are negative for acute change except as noted in the HPI.   Physical Exam Updated Vital Signs BP 128/81   Pulse 64   Temp 97.9 F (36.6 C) (Oral)   Resp 16   SpO2 100%   Physical Exam  Constitutional: He is oriented to person, place, and time. He appears well-developed and well-nourished. No distress.  HENT:  Head: Normocephalic and atraumatic.  Moist mucous membranes  Eyes: Conjunctivae are normal. Pupils are equal, round, and reactive to light.  Neck: Neck supple.  Cardiovascular: Normal rate and normal heart sounds.  An irregularly irregular rhythm present.  No murmur heard. Pulmonary/Chest: Effort normal and breath sounds normal.  Abdominal: Soft. Bowel sounds are normal. He exhibits no distension. There is no tenderness.  Genitourinary: Rectal exam shows guaiac positive stool.  Genitourinary Comments: Bright red blood on rectal exam  Musculoskeletal: He exhibits no edema.    Neurological: He is alert and oriented to person, place, and time.  Fluent speech  Skin: Skin is warm and dry.  Psychiatric: He has a normal mood and affect. Judgment normal.  Nursing note and vitals reviewed.    Chaperone was present during exam.  ED Treatments / Results  Labs (all labs ordered are listed, but only abnormal results are displayed) Labs Reviewed  COMPREHENSIVE METABOLIC PANEL - Abnormal; Notable for the following:       Result Value   Total Protein 6.4 (*)    ALT 16 (*)    All other components within normal limits  CBC - Abnormal; Notable for the following:    WBC 10.8 (*)  All other components within normal limits  PROTIME-INR - Abnormal; Notable for the following:    Prothrombin Time 21.3 (*)    All other components within normal limits  POC OCCULT BLOOD, ED - Abnormal; Notable for the following:    Fecal Occult Bld POSITIVE (*)    All other components within normal limits  TYPE AND SCREEN     EKG  EKG Interpretation  Date/Time:    Ventricular Rate:    PR Interval:    QRS Duration:   QT Interval:    QTC Calculation:   R Axis:     Text Interpretation:           Radiology No results found.  Procedures Procedures (including critical care time) .Critical Care Performed by: Sharlett Iles Authorized by: Sharlett Iles   Critical care provider statement:    Critical care time (minutes):  30   Critical care time was exclusive of:  Separately billable procedures and treating other patients   Critical care was necessary to treat or prevent imminent or life-threatening deterioration of the following conditions: GI bleed.   Critical care was time spent personally by me on the following activities:  Development of treatment plan with patient or surrogate, discussions with consultants, examination of patient, obtaining history from patient or surrogate, ordering and performing treatments and interventions, ordering and review of  laboratory studies, re-evaluation of patient's condition and review of old charts    Medications Ordered in ED  Medications  phytonadione (VITAMIN K) 10 mg in dextrose 5 % 50 mL IVPB (10 mg Intravenous New Bag/Given 01/30/16 1952)     Initial Impression / Assessment and Plan / ED Course  I have reviewed the triage vital signs and the nursing notes.  Pertinent labs & imaging results that were available during my care of the patient were reviewed by me and considered in my medical decision making (see chart for details).  Clinical Course    Pt w/ 3 episodes of BRBPR yesterday, 1 small episode today, no associated pain. He was well-appearing with normal vital signs at presentation. No abdominal tenderness. He did have bright red blood on rectal exam. Labs show stable hemoglobin at 15, INR 1.8. Gave IV vitamin K.  I am concerned about the presence of bright red blood on exam given his Coumadin use. Discussed with [facility], Dr. Havery Moros, who recommended clear liquid diet, tagged RBC study if pt begins to bleed again. Maryanna Shape will see pt in consultation. Discussed admission with [facility], Dr. Lorin Mercy. I appreciate Dr. Havery Moros and Dr. Narda Amber assistance in the patient's care. Pt admitted to medicine for further care.  Final Clinical Impressions(s) / ED Diagnoses   Final diagnoses:  Lower GI bleed     New Prescriptions   No medications on file       Sharlett Iles, MD 01/30/16 2018

## 2016-01-30 NOTE — ED Notes (Signed)
Hemoccult completed at 1730 per Dr. Rex Kras RN at bedside.

## 2016-01-31 ENCOUNTER — Observation Stay (HOSPITAL_COMMUNITY): Payer: MEDICARE

## 2016-01-31 ENCOUNTER — Encounter (HOSPITAL_COMMUNITY): Payer: Self-pay | Admitting: Internal Medicine

## 2016-01-31 DIAGNOSIS — Z7901 Long term (current) use of anticoagulants: Secondary | ICD-10-CM | POA: Diagnosis not present

## 2016-01-31 DIAGNOSIS — K648 Other hemorrhoids: Secondary | ICD-10-CM | POA: Diagnosis not present

## 2016-01-31 DIAGNOSIS — K5791 Diverticulosis of intestine, part unspecified, without perforation or abscess with bleeding: Secondary | ICD-10-CM | POA: Diagnosis not present

## 2016-01-31 DIAGNOSIS — J45991 Cough variant asthma: Secondary | ICD-10-CM | POA: Diagnosis not present

## 2016-01-31 DIAGNOSIS — E119 Type 2 diabetes mellitus without complications: Secondary | ICD-10-CM | POA: Diagnosis not present

## 2016-01-31 DIAGNOSIS — K922 Gastrointestinal hemorrhage, unspecified: Secondary | ICD-10-CM | POA: Diagnosis not present

## 2016-01-31 DIAGNOSIS — K921 Melena: Secondary | ICD-10-CM

## 2016-01-31 DIAGNOSIS — Z8673 Personal history of transient ischemic attack (TIA), and cerebral infarction without residual deficits: Secondary | ICD-10-CM | POA: Diagnosis not present

## 2016-01-31 LAB — CBC
HEMATOCRIT: 40.2 % (ref 39.0–52.0)
HEMOGLOBIN: 13.1 g/dL (ref 13.0–17.0)
MCH: 30.6 pg (ref 26.0–34.0)
MCHC: 32.6 g/dL (ref 30.0–36.0)
MCV: 93.9 fL (ref 78.0–100.0)
Platelets: 337 10*3/uL (ref 150–400)
RBC: 4.28 MIL/uL (ref 4.22–5.81)
RDW: 14.9 % (ref 11.5–15.5)
WBC: 8.8 10*3/uL (ref 4.0–10.5)

## 2016-01-31 LAB — BASIC METABOLIC PANEL
ANION GAP: 3 — AB (ref 5–15)
BUN: 9 mg/dL (ref 6–20)
CO2: 27 mmol/L (ref 22–32)
Calcium: 8.2 mg/dL — ABNORMAL LOW (ref 8.9–10.3)
Chloride: 105 mmol/L (ref 101–111)
Creatinine, Ser: 0.78 mg/dL (ref 0.61–1.24)
GFR calc Af Amer: >60 mL/min (ref >60)
GLUCOSE: 108 mg/dL — AB (ref 65–99)
POTASSIUM: 3.7 mmol/L (ref 3.5–5.1)
Sodium: 135 mmol/L (ref 135–145)

## 2016-01-31 LAB — HEMOGLOBIN AND HEMATOCRIT, BLOOD
HEMATOCRIT: 41.1 % (ref 39.0–52.0)
Hemoglobin: 13.1 g/dL (ref 13.0–17.0)

## 2016-01-31 MED ORDER — TECHNETIUM TC 99M-LABELED RED BLOOD CELLS IV KIT
24.7000 | PACK | Freq: Once | INTRAVENOUS | Status: AC | PRN
  Administered 2016-01-31: 24.7 via INTRAVENOUS

## 2016-01-31 NOTE — Progress Notes (Addendum)
Progress Note    [name]  [id number] DOB: 1931/05/29  DOA: 01/30/2016 PCP: Kandice Hams, MD    Brief Narrative:   Chief complaint: Follow-up GI bleed  [name] is an 82 y.o. male with medical history significant of CVA on Coumadin, And hyperlipidemia Who was admitted 01/30/16 with GI bleeding, thought to be diverticular.  Assessment/Plan:   Principal Problem:   Lower GI bleeding Thought to be diverticular. GI consultation pending. 2 g drop in hemoglobin noted since admission values. RBC scan negative.  Images personally reviewed.  Monitor H&H and transfuse as needed.  Active Problems:   Chronic anticoagulation/H/O Stroke Status post vitamin K. Coumadin on hold. Records reviewed. No documented history of atrial fibrillation and the patient confirms that he wore a Holter monitor which was negative. Telemetry reviewed. Has some PVCs but otherwise has been maintaining normal sinus rhythm.    Cough variant asthma Sees Dr. Melvyn Novas. Continue Singulair and Dulera. Stable.     Hyperlipidemia Continue Zocor.    Diet controlled diabetes Advance diet to carbohydrate modified.   Family Communication/Anticipated D/C date and plan/Code Status   DVT prophylaxis: SCDs ordered. Code Status: Full Code.  Family Communication: Wife updated at the bedside. Disposition Plan: Home when stable, possibly tomorrow if hemoglobin stable.   Medical Consultants:    Gastroenterology   Procedures:    None  Anti-Infectives:    None  Subjective:   The patient reports that He had a very bloody stool this morning and another stool as afternoon that was less bloody. Review of symptoms is negative for nausea, vomiting, abdominal pain.  Objective:    Vitals:   01/30/16 2255 01/31/16 0646 01/31/16 0736 01/31/16 0834  BP:  139/73  139/69  Pulse:  69  83  Resp:  18  19  Temp:  97.8 F (36.6 C)  98 F (36.7 C)  TempSrc:    Oral  SpO2: 98% 95% 96% 100%  Weight:        Height:        Intake/Output Summary (Last 24 hours) at 01/31/16 1545 Last data filed at 01/31/16 1443  Gross per 24 hour  Intake          1411.67 ml  Output             1400 ml  Net            11.67 ml   Filed Weights   01/30/16 2110  Weight: 72.6 kg (160 lb)    Exam: General exam: Appears calm and comfortable.  Respiratory system: Clear to auscultation. Respiratory effort normal. Cardiovascular system: S1 & S2 heard, RRR, Occasional premature beat. No JVD,  rubs, gallops or clicks. No murmurs. Gastrointestinal system: Abdomen is nondistended, soft and nontender. No organomegaly or masses felt. Normal bowel sounds heard. Central nervous system: Alert and oriented. No focal neurological deficits. Extremities: No clubbing,  or cyanosis. No edema. Skin: No rashes, lesions or ulcers. Psychiatry: Judgement and insight appear normal. Mood & affect appropriate.   Data Reviewed:   I have personally reviewed following labs and imaging studies:  Labs: Basic Metabolic Panel:  Recent Labs Lab 01/30/16 1616 01/31/16 0517  NA 139 135  K 4.0 3.7  CL 105 105  CO2 27 27  GLUCOSE 97 108*  BUN 13 9  CREATININE 0.78 0.78  CALCIUM 8.9 8.2*   GFR Estimated Creatinine Clearance: 73.1 mL/min (by C-G formula based on SCr of 0.78 mg/dL). Liver Function Tests:  Recent Labs Lab 01/30/16 1616  AST 22  ALT 16*  ALKPHOS 72  BILITOT 0.6  PROT 6.4*  ALBUMIN 3.6   Coagulation profile  Recent Labs Lab 01/30/16 1616  INR 1.82    CBC:  Recent Labs Lab 01/30/16 1616 01/31/16 0517  WBC 10.8* 8.8  HGB 15.0 13.1  HCT 45.8 40.2  MCV 94.6 93.9  PLT 383 337   CBG:  Recent Labs Lab 01/30/16 2123  GLUCAP 85   Microbiology No results found for this or any previous visit (from the past 240 hour(s)).  Radiology: Nm Gi Blood Loss  Result Date: 01/31/2016 CLINICAL DATA:  Bloody stool this morning. EXAM: NUCLEAR MEDICINE GASTROINTESTINAL BLEEDING SCAN TECHNIQUE: Sequential  abdominal images were obtained following intravenous administration of Tc-74m labeled red blood cells. RADIOPHARMACEUTICALS:  24.7 mCi Tc-2m in-vitro labeled red cells. COMPARISON:  None. FINDINGS: No abnormal focus of activity is demonstrated in the bowel to suggest a lower GI bleed. Normal physiologic biodistribution of the radiotracer is demonstrated throughout the abdomen. IMPRESSION: No scintigraphic evidence of active gastrointestinal bleeding. Electronically Signed   By: Kathreen Devoid   On: 01/31/2016 12:21    Medications:   . fluticasone  2 spray Each Nare Daily  . mometasone-formoterol  2 puff Inhalation BID  . montelukast  10 mg Oral QHS  . simvastatin  20 mg Oral Daily   Continuous Infusions: . lactated ringers Stopped (01/31/16 1539)    Medical decision making is of high complexity  (> 4 problem points, >4 data points)   LOS: 0 days   [name]  [facility] Pager [phone]. If unable to reach me by pager, please call my cell phone at [phone].  *Please refer to [email or web address], password [email or web address] to get updated schedule on who will round on this patient, as hospitalists switch teams weekly. If 7PM-7AM, please contact night-coverage at [email or web address], password [email or web address] for any overnight needs.  01/31/2016, 3:45 PM

## 2016-01-31 NOTE — Care Management Obs Status (Signed)
[facility] NOTIFICATION   Patient Details  Name: [name] MRN: [id number] Date of Birth: 1931-08-01   Medicare Observation Status Notification Given:  Yes  OBS notification given and explained in detail to pt and wife. No signature.   Angalina Ante, Rory Percy, RN 01/31/2016, 4:07 PM

## 2016-01-31 NOTE — Progress Notes (Signed)
New Admission Note:  Arrival Method: Stretcher  Mental Orientation: Alert and oriented x 4 Telemetry: BOX 22 NSR Assessment: Completed Skin: Skin warm and dry.  IV: NSL  Pain: Denies  Tubes: N/A Safety Measures: Safety Fall Prevention Plan was given, discussed and signed. Admission: Completed [address] Orientation: Patient has been orientated to the room, unit and the staff. Family: Wife   Orders have been reviewed and implemented. Will continue to monitor the patient. Call light has been placed within reach and bed alarm has been activated.   Sima Matas BSN, RN  Phone Number: [phone]

## 2016-01-31 NOTE — Care Management Note (Signed)
Case Management Note  Patient Details  Name: [name] MRN: [id number] Date of Birth: 09/25/1931  Subjective/Objective:     CM following for progression and d/c planning.                Action/Plan: 01/31/2016 No d/c needs identified at this time, will continue to follow.   Expected Discharge Date:  01/31/16               Expected Discharge Plan:  Home/Self Care  In-House Referral:  NA  Discharge planning Services  NA  Post Acute Care Choice:    Choice offered to:  NA  DME Arranged:    DME Agency:     HH Arranged:    HH Agency:     Status of Service:  In process, will continue to follow  If discussed at Long Length of Stay Meetings, dates discussed:    Additional Comments:  Adron Bene, RN 01/31/2016, 2:02 PM

## 2016-01-31 NOTE — Consult Note (Signed)
[facility] Consult: 8:21 AM 01/31/2016  LOS: 0 days    Referring Provider: Dr Rockne Menghini  Primary Care Physician:  Kandice Hams, MD Primary Gastroenterologist:  Dr. Ardis Hughs     Reason for Consultation:  GI bleed.  Large volume painless hematochezia.    HPI: [name] is a 82 y.o. male.  PMH CVA 07/2015, on Coumadin.  DM2.  COPD.  S/p ventral hernia repair.  GERD.  S/p Nissen but recurrent HH on CT 2012.  Previous diverticular bleed 2009.  S/p 2009 splenic rupture.  Ventral hernia at site of splenectomy.  2003 Colonoscopy.  Non-adenomatous polyps.   2009 Colonoscopy.  No recurrent polyps.  Dense left-sided tics.  2009 EGD with dilation of esophageal stricture/ring.  Deeper than normal post dilation tear.  No leak/perf on GG study.    Sudden, painless, volume hematochezia in the morning of Wednesday 12/13. He had 2, much smaller episodes where he passed blood in stool. He spoke with his primary physician who said that if he had any further bleeding he should go to the emergency room. Yesterday afternoon he had a smaller episode of hematochezia so he came over to the ER at about 1:30 PM. At no point has he had any dizziness, lightheadedness, abdominal pain, nausea. The situation is reminiscent of previous diverticular bleed. Additionally he doesn't have any dysphagia. He is not taking any aspirin or NSAID products. Weight is stable. Hasn't had any unusual dermatologic or orifice bleeding..   Hgb 15.. 13.1 overnight.  Baseline 15.5 to 16.5 07/2015 - 10/2015.   PT/INR 21/1.8.  No worrisome chemistries.   He had a moderately large episode of hematochezia this morning and is headed down to nuclear medicine for tagged RBC scan. He is still feeling quite well and has no constitutional complaints. Although his pulse did dip  to 41 on one occasion it's generally running in the 60s to 80s. Blood pressure without hypertension.    Past Medical History:  Diagnosis Date  . Asthma   . Colitis   . Colon polyps   . Diabetes mellitus type 2, diet-controlled ([place]) 12/22/2013  . Diverticulosis   . GERD (gastroesophageal reflux disease)   . Hemorrhoids   . Hiatal hernia   . History of esophageal strciture   . HLD (hyperlipidemia)   . Osteoarthritis   . Proctitis   . Status post dilation of esophageal narrowing   . Stroke [facility])     Past Surgical History:  Procedure Laterality Date  . ABDOMINAL HERNIA REPAIR    . esophageal hernia    . INSERTION OF MESH  01/29/2012   Procedure: INSERTION OF MESH;  Surgeon: Gwenyth Ober, MD;  Location: [facility];  Service: General;  Laterality: N/A;  . nissen    . SPLENECTOMY, TOTAL     nontraumatic rupture  . VENTRAL HERNIA REPAIR  01/29/2012    WITH MESH  . VENTRAL HERNIA REPAIR  01/29/2012   Procedure: HERNIA REPAIR VENTRAL ADULT;  Surgeon: Gwenyth Ober, MD;  Location: [place];  Service: General;  Laterality: N/A;  open recurrent ventral hernia repair with mesh    Prior to Admission medications   Medication Sig Start Date End Date Taking? Authorizing Provider  fluticasone (FLONASE) 50 MCG/ACT nasal spray Place 2 sprays into both nostrils daily. 06/18/14  Yes Historical Provider, MD  loratadine (CLARITIN) 10 MG tablet Take 10 mg by mouth daily.   Yes Historical Provider, MD  mometasone-formoterol (DULERA) 100-5 MCG/ACT AERO Inhale 2 puffs into the lungs 2 (two) times daily. 12/12/15  Yes Tanda Rockers, MD  montelukast (SINGULAIR) 10 MG tablet Take 1 tablet (10 mg total) by mouth at bedtime. 10/31/15  Yes Tanda Rockers, MD  simvastatin (ZOCOR) 20 MG tablet Take 20 mg by mouth daily.   Yes Historical Provider, MD  warfarin (COUMADIN) 5 MG tablet Take 5 mg by mouth daily. Managed by [place] Provider, MD    Scheduled Meds: . fluticasone  2 spray Each Nare Daily  .  mometasone-formoterol  2 puff Inhalation BID  . montelukast  10 mg Oral QHS  . simvastatin  20 mg Oral Daily   Infusions: . lactated ringers 100 mL/hr at 01/31/16 0649   PRN Meds: acetaminophen **OR** acetaminophen, ondansetron **OR** ondansetron (ZOFRAN) IV, zolpidem   Allergies as of 01/30/2016  . (No Known Allergies)    Family History  Problem Relation Age of Onset  . Cancer Father     Social History   Social History  . Marital status: Married    Spouse name: [name]  . Number of children: 2  . Years of education: Bachelor's   Occupational History  . retired    Social History Main Topics  . Smoking status: Never Smoker  . Smokeless tobacco: Never Used  . Alcohol use 0.0 oz/week     Comment: 1 1/2 - 2 daily  . Drug use: No  . Sexual activity: Not on file   Other Topics Concern  . Not on file   Social History Narrative   Patient is married and has 2 children.   Patient is right handed.   Patient has Bachelor's degree.   Patient drinks 2 cups daily.    REVIEW OF SYSTEMS: Constitutional:  Patient is fairly active. He plays golf. ENT:  No nose bleeds Pulm:  Stable dyspnea on exertion.  No cough. CV:  No palpitations, no LE edema.  GU:  No hematuria, no frequency GI:  Patient does not use PPI. PPI was tried by pulmonary in the past to see if it would help his cough. However PPI was discontinued as it made no difference in his cough pattern Heme:  Per HPI   Transfusions:  Patient does not recall ever having had transfusions and he definitely did not have transfusion with his 2009 diverticular bleed. Neuro:  No headaches, no peripheral tingling or numbness Derm:  No itching, no rash or sores.  Endocrine:  No sweats or chills.  No polyuria or dysuria Immunization:  Immunizations reviewed, he is up-to-date on his flu and pneumococcal vaccinations. Travel:  None beyond local counties in last few months.    PHYSICAL EXAM: Vital signs in last 24 hours: Vitals:     01/30/16 2110 01/31/16 0646  BP: (!) 149/88 139/73  Pulse: 72 69  Resp: 20 18  Temp: 98.6 F (37 C) 97.8 F (36.6 C)   Wt Readings from Last 3 Encounters:  01/30/16 72.6 kg (160 lb)  12/12/15 76.7 kg (169 lb)  11/25/15 75.4 kg (166 lb 3.2 oz)    General:  Pleasant, alert. Does not look ill. Head:  No facial asymmetry, no head trauma.  Eyes:  Scleral icterus. No conjunctival pallor. Ears:  Not HOH  Nose:  No discharge or congestion Mouth:  Clear and moist oral mucosa. Neck:  No JVD, no TMG. Lungs:  Clear bilaterally. No shortness of breath, rhonchi or rales. No cough Heart: RRR. No MRG. S1, S2 present. Abdomen:  Soft. Not tender or distended. He does have a large, nontender incisional hernia to the left of midline.  Bowel sounds are active.  No organomegaly.   Rectal: Deferred   Musc/Skeltl: No joint erythema, contracture deformities or swelling. Extremities:  No CCE.  Neurologic:  Patient is fully alert. He is oriented 3. He is a good historian. Moves all 4 limbs and stands up without assistance. Limb strength was not tested. No tremor. Skin:  No telangiectasia, sores or rashes. Nodes:  No cervical adenopathy.   Psych:  Pleasant, calm, speech fluid.  Intake/Output from previous day: 12/14 0701 - 12/15 0700 In: 1051.7 [P.O.:240; I.V.:811.7] Out: 1300 [Urine:1300] Intake/Output this shift: No intake/output data recorded.  LAB RESULTS:  Recent Labs  01/30/16 1616 01/31/16 0517  WBC 10.8* 8.8  HGB 15.0 13.1  HCT 45.8 40.2  PLT 383 337   BMET Lab Results  Component Value Date   NA 135 01/31/2016   NA 139 01/30/2016   NA 139 07/26/2015   K 3.7 01/31/2016   K 4.0 01/30/2016   K 3.9 07/26/2015   CL 105 01/31/2016   CL 105 01/30/2016   CL 105 07/26/2015   CO2 27 01/31/2016   CO2 27 01/30/2016   CO2 26 07/26/2015   GLUCOSE 108 (H) 01/31/2016   GLUCOSE 97 01/30/2016   GLUCOSE 131 (H) 07/26/2015   BUN 9 01/31/2016   BUN 13 01/30/2016   BUN 11 07/26/2015    CREATININE 0.78 01/31/2016   CREATININE 0.78 01/30/2016   CREATININE 0.89 07/26/2015   CALCIUM 8.2 (L) 01/31/2016   CALCIUM 8.9 01/30/2016   CALCIUM 8.7 (L) 07/26/2015   LFT  Recent Labs  01/30/16 1616  PROT 6.4*  ALBUMIN 3.6  AST 22  ALT 16*  ALKPHOS 72  BILITOT 0.6   PT/INR Lab Results  Component Value Date   INR 1.82 01/30/2016   INR 0.90 06/28/2014   INR 0.98 12/22/2013   Hepatitis Panel No results for input(s): HEPBSAG, HCVAB, HEPAIGM, HEPBIGM in the last 72 hours. C-Diff No components found for: CDIFF Lipase  No results found for: LIPASE  Drugs of Abuse     Component Value Date/Time   LABOPIA NONE DETECTED 12/22/2013 1500   COCAINSCRNUR NONE DETECTED 12/22/2013 1500   LABBENZ NONE DETECTED 12/22/2013 1500   AMPHETMU NONE DETECTED 12/22/2013 1500   THCU NONE DETECTED 12/22/2013 1500   LABBARB NONE DETECTED 12/22/2013 1500     RADIOLOGY STUDIES: No results found.   IMPRESSION:   *   Painless LGIB.  Likely diverticular.  Patient hemodynamically stable.  *   Chronic Coumadin for CVA in 07/2015.  INR subtherapeutic. Patient has received a single, 10 mg dose of IV vitamin K.    PLAN:     *  Just now heading to nuclear med for RBC scan.    *  CBC in the morning.   Azucena Freed  01/31/2016, 8:21 AM Pager: [phone]

## 2016-02-01 DIAGNOSIS — K922 Gastrointestinal hemorrhage, unspecified: Secondary | ICD-10-CM | POA: Diagnosis not present

## 2016-02-01 DIAGNOSIS — K5791 Diverticulosis of intestine, part unspecified, without perforation or abscess with bleeding: Secondary | ICD-10-CM | POA: Diagnosis not present

## 2016-02-01 DIAGNOSIS — Z7901 Long term (current) use of anticoagulants: Secondary | ICD-10-CM | POA: Diagnosis not present

## 2016-02-01 DIAGNOSIS — K648 Other hemorrhoids: Secondary | ICD-10-CM | POA: Diagnosis not present

## 2016-02-01 DIAGNOSIS — Z8673 Personal history of transient ischemic attack (TIA), and cerebral infarction without residual deficits: Secondary | ICD-10-CM

## 2016-02-01 DIAGNOSIS — J45991 Cough variant asthma: Secondary | ICD-10-CM | POA: Diagnosis not present

## 2016-02-01 DIAGNOSIS — Z8719 Personal history of other diseases of the digestive system: Secondary | ICD-10-CM | POA: Diagnosis present

## 2016-02-01 DIAGNOSIS — E119 Type 2 diabetes mellitus without complications: Secondary | ICD-10-CM | POA: Diagnosis not present

## 2016-02-01 DIAGNOSIS — K921 Melena: Secondary | ICD-10-CM | POA: Diagnosis not present

## 2016-02-01 LAB — CBC
HCT: 41.1 % (ref 39.0–52.0)
Hemoglobin: 13.3 g/dL (ref 13.0–17.0)
MCH: 30.3 pg (ref 26.0–34.0)
MCHC: 32.4 g/dL (ref 30.0–36.0)
MCV: 93.6 fL (ref 78.0–100.0)
PLATELETS: 352 10*3/uL (ref 150–400)
RBC: 4.39 MIL/uL (ref 4.22–5.81)
RDW: 14.7 % (ref 11.5–15.5)
WBC: 9 10*3/uL (ref 4.0–10.5)

## 2016-02-01 MED ORDER — HYDROCORTISONE ACETATE 25 MG RE SUPP: 25.0000 mg | Freq: Two times a day (BID) | RECTAL | 0 refills | Status: DC

## 2016-02-01 NOTE — Discharge Summary (Signed)
Physician Discharge Summary  [name] [id number] DOB: 11-05-1931 DOA: 01/30/2016  PCP: Kandice Hams, MD  Admit date: 01/30/2016 Discharge date: 02/01/2016  Admitted From: Home Discharge disposition: Home   Recommendations for Outpatient Follow-Up:   1. Patient will follow-up with his PCP next week and have a repeat CBC drawn at that time. 2. Instructed to resume Coumadin 02/02/16 as recommended by gastroenterology and consistent with current guidelines which were reviewed with the patient.   Discharge Diagnosis:   Principal Problem:    Lower GI bleeding, Presumed diverticular Active Problems:    Diabetes mellitus type 2, diet-controlled (HCC)    Cough variant asthma    Chronic anticoagulation    History of CVA (cerebrovascular accident)    Acute lower GI bleeding    Lower GI bleed  Discharge Condition: Improved.  Diet recommendation: Low sodium, heart healthy.  Carbohydrate-modified.    History of Present Illness:   [name] is an 82 y.o. male with medical history significant of CVA on Coumadin, And hyperlipidemia Who was admitted 01/30/16 with GI bleeding, thought to be diverticular.  Hospital Course by Problem:   Principal Problem:   Lower GI bleeding Thought to be diverticular versus hemorrhoidal. 2 g drop in hemoglobin noted from admission values, but subsequently stable for the past 3 checks. RBC scan negative.  Images personally reviewed.  Hemoglobin stable over the past 24 hours. Evaluated by GI. Placed on Anusol per rectum times 7-10 days as hemorrhoidal bleeding was in the differential.   Active Problems:   Chronic anticoagulation/H/O Stroke Status post vitamin K. Coumadin held.  Records reviewed. No documented history of atrial fibrillation and the patient confirms that he wore a Holter monitor which was negative. Telemetry reviewed. Has some PVCs but otherwise has been maintaining normal sinus rhythm. A recent study published in  Archives of Internal Medicine supports a quick resumption of anticoagulation following a GI bleed. After adjusting for various clinical indicators (e.g. clinical seriousness of bleeding, requirement of transfusions), the investigators found that the decision not to resume warfarin within 90 days of an initial GI hemorrhage was associated with an increased risk of thrombosis and death. This study was discussed with the patient and the patient was advised to resume his Coumadin 02/02/16.    Cough variant asthma Sees Dr. Melvyn Novas. Continue Singulair and Dulera. Stable.     Hyperlipidemia Continue Zocor.    Diet controlled diabetes Advance diet to carbohydrate modified.    Medical Consultants:    Gastroenterology   Discharge Exam:   Vitals:   02/01/16 0457 02/01/16 0918  BP: 124/72 110/63  Pulse: 75 85  Resp: 20 18  Temp: 98.3 F (36.8 C) 97.9 F (36.6 C)   Vitals:   01/31/16 2043 02/01/16 0457 02/01/16 0918 02/01/16 0923  BP: 110/63 124/72 110/63   Pulse: 87 75 85   Resp: 19 20 18    Temp: 98.2 F (36.8 C) 98.3 F (36.8 C) 97.9 F (36.6 C)   TempSrc: Oral Oral Oral   SpO2: 97% 98% 96% 94%  Weight: 72.5 kg (159 lb 13.3 oz)     Height:        General exam: Appears calm and comfortable.  Respiratory system: Clear to auscultation. Respiratory effort normal. Cardiovascular system: S1 & S2 heard, RRR. No JVD,  rubs, gallops or clicks. No murmurs. Gastrointestinal system: Abdomen is nondistended, soft and nontender. No organomegaly or masses felt. Normal bowel sounds heard. Central nervous system: Alert and oriented. No focal neurological deficits.  Extremities: No clubbing,  or cyanosis. No edema. Skin: No rashes, lesions or ulcers. Psychiatry: Judgement and insight appear normal. Mood & affect appropriate.    The results of significant diagnostics from this hospitalization (including imaging, microbiology, ancillary and laboratory) are listed below for reference.      Procedures and Diagnostic Studies:   Nm Gi Blood Loss  Result Date: 01/31/2016 CLINICAL DATA:  Bloody stool this morning. EXAM: NUCLEAR MEDICINE GASTROINTESTINAL BLEEDING SCAN TECHNIQUE: Sequential abdominal images were obtained following intravenous administration of Tc-82m labeled red blood cells. RADIOPHARMACEUTICALS:  24.7 mCi Tc-67m in-vitro labeled red cells. COMPARISON:  None. FINDINGS: No abnormal focus of activity is demonstrated in the bowel to suggest a lower GI bleed. Normal physiologic biodistribution of the radiotracer is demonstrated throughout the abdomen. IMPRESSION: No scintigraphic evidence of active gastrointestinal bleeding. Electronically Signed   By: Kathreen Devoid   On: 01/31/2016 12:21     Labs:   Basic Metabolic Panel:  Recent Labs Lab 01/30/16 1616 01/31/16 0517  NA 139 135  K 4.0 3.7  CL 105 105  CO2 27 27  GLUCOSE 97 108*  BUN 13 9  CREATININE 0.78 0.78  CALCIUM 8.9 8.2*   GFR Estimated Creatinine Clearance: 73 mL/min (by C-G formula based on SCr of 0.78 mg/dL). Liver Function Tests:  Recent Labs Lab 01/30/16 1616  AST 22  ALT 16*  ALKPHOS 72  BILITOT 0.6  PROT 6.4*  ALBUMIN 3.6   Coagulation profile  Recent Labs Lab 01/30/16 1616  INR 1.82    CBC:  Recent Labs Lab 01/30/16 1616 01/31/16 0517 01/31/16 2055 02/01/16 0446  WBC 10.8* 8.8  --  9.0  HGB 15.0 13.1 13.1 13.3  HCT 45.8 40.2 41.1 41.1  MCV 94.6 93.9  --  93.6  PLT 383 337  --  352   CBG:  Recent Labs Lab 01/30/16 2123  GLUCAP 85     Discharge Instructions:   Discharge Instructions    Call MD for:    Complete by:  As directed    Recurrent bleeding from the rectum.   Call MD for:  extreme fatigue    Complete by:  As directed    Diet - low sodium heart healthy    Complete by:  As directed    Increase activity slowly    Complete by:  As directed      Allergies as of 02/01/2016   No Known Allergies     Medication List    TAKE these  medications   fluticasone 50 MCG/ACT nasal spray Commonly known as:  FLONASE Place 2 sprays into both nostrils daily.   hydrocortisone 25 MG suppository Commonly known as:  ANUSOL-HC Place 1 suppository (25 mg total) rectally 2 (two) times daily.   loratadine 10 MG tablet Commonly known as:  CLARITIN Take 10 mg by mouth daily.   mometasone-formoterol 100-5 MCG/ACT Aero Commonly known as:  DULERA Inhale 2 puffs into the lungs 2 (two) times daily.   montelukast 10 MG tablet Commonly known as:  SINGULAIR Take 1 tablet (10 mg total) by mouth at bedtime.   simvastatin 20 MG tablet Commonly known as:  ZOCOR Take 20 mg by mouth daily.   warfarin 5 MG tablet Commonly known as:  COUMADIN Take 5 mg by mouth daily. Managed by [facility]      Follow-up Information    Kandice Hams, MD. Go on 02/06/2016.   Specialty:  Internal Medicine Why:  At your scheduled appointment with Dr. Delfina [place]. Contact  information: [address] [place] [address] [phone]            Time coordinating discharge: 35 minutes.  Signed:  Karman Biswell  Pager [phone] [facility] 02/01/2016, 3:02 PM

## 2016-02-01 NOTE — Progress Notes (Signed)
Patient discharged to home. AVS reviewed with patient and his wife, prescriptions sent to patient's pharmacy. Called pharmacy to confirm  patient should resume coumadin tomorrow. Patient has follow up appointments scheduled. IV's discontinued and patient left floor via wheelchair escorted by staff.

## 2016-02-01 NOTE — Progress Notes (Addendum)
   Patient Name: [name] Date of Encounter: 02/01/2016, 9:35 AM    Subjective  He had a BM this morning with normal brown stool and a streak of bright red blood. Hgb Stable at 13.    Objective  BP 110/63 (BP Location: Left Arm)   Pulse 85   Temp 97.9 F (36.6 C) (Oral)   Resp 18   Ht 5' 10.5" (1.791 m)   Wt 159 lb 13.3 oz (72.5 kg)   SpO2 94%   BMI 22.61 kg/m  General:  Well-developed, well-nourished and in no acute distress Eyes:  anicteric. Lungs: Clear to auscultation bilaterally. Heart:    S1S2 Abdomen:  soft, non-tender and BS+.  Extremities:   no edema Neuro:  A&O x 3.  Psych:  appropriate mood and  Affect.  I personally reviewed labs, radiologic imaging, chart and pertinent  GI work up, discussed with patient   Assessment and Plan  82 year old male with history of CVA in June 2017 on Coumadin and presented with hematochezia. Nuclear med RBC tagged scan that was done yesterday was negative for active GI bleed. Based on history and stool this morning, appears to be more hemorrhoidal hemorrhage than a diverticular hemorrhage Hemoglobin has been stable since admission at 13. Patient is also stable hemodynamically Advance diet as tolerated Anusol cream per rectum twice daily for 7-10 days  If continues to have recurrent episodes of bright red blood per rectum, we'll have to consider hemorrhoidal band ligation. Follow-up as outpatient, patient has an appointment with our office next month Okay to restart Coumadin tomorrow if indicated Okay to discharge home from GI perspective We will sign off, please call with any questions     K. Denzil Magnuson , MD [phone] Mon-Fri 8a-5p [phone] after 5p, weekends, holidays

## 2016-02-04 ENCOUNTER — Ambulatory Visit: Payer: Medicare HMO | Admitting: Internal Medicine

## 2016-02-06 DIAGNOSIS — K922 Gastrointestinal hemorrhage, unspecified: Secondary | ICD-10-CM | POA: Diagnosis not present

## 2016-02-22 ENCOUNTER — Emergency Department (HOSPITAL_COMMUNITY): Payer: Medicare HMO

## 2016-02-22 ENCOUNTER — Observation Stay (HOSPITAL_COMMUNITY): Payer: Medicare HMO

## 2016-02-22 ENCOUNTER — Encounter (HOSPITAL_COMMUNITY): Payer: Self-pay | Admitting: Emergency Medicine

## 2016-02-22 ENCOUNTER — Inpatient Hospital Stay (HOSPITAL_COMMUNITY)
Admission: EM | Admit: 2016-02-22 | Discharge: 2016-02-27 | DRG: 065 | Disposition: A | Discharge status: Skilled Nursing Facility | Payer: Medicare HMO | Attending: Internal Medicine | Admitting: Internal Medicine

## 2016-02-22 DIAGNOSIS — R2689 Other abnormalities of gait and mobility: Secondary | ICD-10-CM

## 2016-02-22 DIAGNOSIS — R791 Abnormal coagulation profile: Secondary | ICD-10-CM

## 2016-02-22 DIAGNOSIS — E118 Type 2 diabetes mellitus with unspecified complications: Secondary | ICD-10-CM

## 2016-02-22 DIAGNOSIS — E785 Hyperlipidemia, unspecified: Secondary | ICD-10-CM | POA: Diagnosis present

## 2016-02-22 DIAGNOSIS — R29705 NIHSS score 5: Secondary | ICD-10-CM | POA: Diagnosis present

## 2016-02-22 DIAGNOSIS — R4781 Slurred speech: Secondary | ICD-10-CM | POA: Diagnosis present

## 2016-02-22 DIAGNOSIS — Z8673 Personal history of transient ischemic attack (TIA), and cerebral infarction without residual deficits: Secondary | ICD-10-CM

## 2016-02-22 DIAGNOSIS — R270 Ataxia, unspecified: Secondary | ICD-10-CM | POA: Diagnosis present

## 2016-02-22 DIAGNOSIS — Z8719 Personal history of other diseases of the digestive system: Secondary | ICD-10-CM

## 2016-02-22 DIAGNOSIS — G8194 Hemiplegia, unspecified affecting left nondominant side: Secondary | ICD-10-CM | POA: Diagnosis present

## 2016-02-22 DIAGNOSIS — Z7901 Long term (current) use of anticoagulants: Secondary | ICD-10-CM

## 2016-02-22 DIAGNOSIS — Z9081 Acquired absence of spleen: Secondary | ICD-10-CM

## 2016-02-22 DIAGNOSIS — R29810 Facial weakness: Secondary | ICD-10-CM | POA: Diagnosis present

## 2016-02-22 DIAGNOSIS — I638 Other cerebral infarction: Principal | ICD-10-CM | POA: Diagnosis present

## 2016-02-22 DIAGNOSIS — R531 Weakness: Secondary | ICD-10-CM

## 2016-02-22 DIAGNOSIS — E663 Overweight: Secondary | ICD-10-CM | POA: Diagnosis present

## 2016-02-22 DIAGNOSIS — R2990 Unspecified symptoms and signs involving the nervous system: Secondary | ICD-10-CM | POA: Diagnosis present

## 2016-02-22 DIAGNOSIS — I6789 Other cerebrovascular disease: Secondary | ICD-10-CM | POA: Diagnosis present

## 2016-02-22 DIAGNOSIS — J45991 Cough variant asthma: Secondary | ICD-10-CM | POA: Diagnosis present

## 2016-02-22 DIAGNOSIS — D72829 Elevated white blood cell count, unspecified: Secondary | ICD-10-CM | POA: Diagnosis present

## 2016-02-22 DIAGNOSIS — I110 Hypertensive heart disease with heart failure: Secondary | ICD-10-CM | POA: Diagnosis present

## 2016-02-22 DIAGNOSIS — I480 Paroxysmal atrial fibrillation: Secondary | ICD-10-CM

## 2016-02-22 DIAGNOSIS — E1149 Type 2 diabetes mellitus with other diabetic neurological complication: Secondary | ICD-10-CM | POA: Diagnosis present

## 2016-02-22 DIAGNOSIS — Z6827 Body mass index (BMI) 27.0-27.9, adult: Secondary | ICD-10-CM

## 2016-02-22 DIAGNOSIS — I459 Conduction disorder, unspecified: Secondary | ICD-10-CM | POA: Diagnosis not present

## 2016-02-22 DIAGNOSIS — I639 Cerebral infarction, unspecified: Secondary | ICD-10-CM

## 2016-02-22 DIAGNOSIS — G2581 Restless legs syndrome: Secondary | ICD-10-CM | POA: Diagnosis not present

## 2016-02-22 DIAGNOSIS — E119 Type 2 diabetes mellitus without complications: Secondary | ICD-10-CM

## 2016-02-22 DIAGNOSIS — I10 Essential (primary) hypertension: Secondary | ICD-10-CM

## 2016-02-22 DIAGNOSIS — R29818 Other symptoms and signs involving the nervous system: Secondary | ICD-10-CM | POA: Diagnosis not present

## 2016-02-22 DIAGNOSIS — I5042 Chronic combined systolic (congestive) and diastolic (congestive) heart failure: Secondary | ICD-10-CM | POA: Diagnosis present

## 2016-02-22 DIAGNOSIS — G459 Transient cerebral ischemic attack, unspecified: Secondary | ICD-10-CM | POA: Diagnosis not present

## 2016-02-22 DIAGNOSIS — Z66 Do not resuscitate: Secondary | ICD-10-CM | POA: Diagnosis present

## 2016-02-22 HISTORY — DX: Essential (primary) hypertension: I10

## 2016-02-22 LAB — URINALYSIS, ROUTINE W REFLEX MICROSCOPIC
BILIRUBIN URINE: NEGATIVE
GLUCOSE, UA: NEGATIVE mg/dL
HGB URINE DIPSTICK: NEGATIVE
KETONES UR: NEGATIVE mg/dL
Leukocytes, UA: NEGATIVE
Nitrite: NEGATIVE
PH: 6 (ref 5.0–8.0)
Protein, ur: NEGATIVE mg/dL
SPECIFIC GRAVITY, URINE: 1.013 (ref 1.005–1.030)

## 2016-02-22 LAB — DIFFERENTIAL
BAND NEUTROPHILS: 0 %
BASOS ABS: 0 10*3/uL (ref 0.0–0.1)
BLASTS: 0 %
Basophils Relative: 0 %
EOS ABS: 2.2 10*3/uL — AB (ref 0.0–0.7)
Eosinophils Relative: 17 %
LYMPHS PCT: 26 %
Lymphs Abs: 3.4 10*3/uL (ref 0.7–4.0)
MONOS PCT: 10 %
Metamyelocytes Relative: 0 %
Monocytes Absolute: 1.3 10*3/uL — ABNORMAL HIGH (ref 0.1–1.0)
Myelocytes: 0 %
NEUTROS PCT: 47 %
Neutro Abs: 6.3 10*3/uL (ref 1.7–7.7)
Other: 0 %
PROMYELOCYTES ABS: 0 %
nRBC: 0 /100 WBC

## 2016-02-22 LAB — RAPID URINE DRUG SCREEN, HOSP PERFORMED
AMPHETAMINES: NOT DETECTED
BENZODIAZEPINES: NOT DETECTED
Barbiturates: NOT DETECTED
Cocaine: NOT DETECTED
OPIATES: NOT DETECTED
TETRAHYDROCANNABINOL: NOT DETECTED

## 2016-02-22 LAB — CBC
HCT: 42.5 % (ref 39.0–52.0)
Hemoglobin: 14.1 g/dL (ref 13.0–17.0)
MCH: 31.2 pg (ref 26.0–34.0)
MCHC: 33.2 g/dL (ref 30.0–36.0)
MCV: 94 fL (ref 78.0–100.0)
Platelets: 373 10*3/uL (ref 150–400)
RBC: 4.52 MIL/uL (ref 4.22–5.81)
RDW: 15 % (ref 11.5–15.5)
WBC: 13.2 10*3/uL — ABNORMAL HIGH (ref 4.0–10.5)

## 2016-02-22 LAB — CBG MONITORING, ED: Glucose-Capillary: 84 mg/dL (ref 65–99)

## 2016-02-22 LAB — COMPREHENSIVE METABOLIC PANEL
ALBUMIN: 3.5 g/dL (ref 3.5–5.0)
ALK PHOS: 67 U/L (ref 38–126)
ALT: 14 U/L — AB (ref 17–63)
ANION GAP: 10 (ref 5–15)
AST: 20 U/L (ref 15–41)
BUN: 14 mg/dL (ref 6–20)
CALCIUM: 8.4 mg/dL — AB (ref 8.9–10.3)
CHLORIDE: 104 mmol/L (ref 101–111)
CO2: 23 mmol/L (ref 22–32)
CREATININE: 0.71 mg/dL (ref 0.61–1.24)
GFR calc non Af Amer: >60 mL/min (ref >60)
GLUCOSE: 88 mg/dL (ref 65–99)
Potassium: 4 mmol/L (ref 3.5–5.1)
SODIUM: 137 mmol/L (ref 135–145)
Total Bilirubin: 0.5 mg/dL (ref 0.3–1.2)
Total Protein: 6.6 g/dL (ref 6.5–8.1)

## 2016-02-22 LAB — I-STAT CHEM 8, ED
BUN: 15 mg/dL (ref 6–20)
CREATININE: 0.7 mg/dL (ref 0.61–1.24)
Calcium, Ion: 1.1 mmol/L — ABNORMAL LOW (ref 1.15–1.40)
Chloride: 103 mmol/L (ref 101–111)
Glucose, Bld: 87 mg/dL (ref 65–99)
HEMATOCRIT: 44 % (ref 39.0–52.0)
HEMOGLOBIN: 15 g/dL (ref 13.0–17.0)
POTASSIUM: 4 mmol/L (ref 3.5–5.1)
SODIUM: 138 mmol/L (ref 135–145)
TCO2: 24 mmol/L (ref 0–100)

## 2016-02-22 LAB — APTT: APTT: 36 s (ref 24–36)

## 2016-02-22 LAB — PROTIME-INR
INR: 2.03
PROTHROMBIN TIME: 23.3 s — AB (ref 11.4–15.2)

## 2016-02-22 LAB — I-STAT TROPONIN, ED: Troponin i, poc: 0 ng/mL (ref 0.00–0.08)

## 2016-02-22 LAB — ETHANOL

## 2016-02-22 MED ORDER — ACETAMINOPHEN 160 MG/5ML PO SOLN: 650.0000 mg | ORAL | Status: DC | PRN

## 2016-02-22 MED ORDER — FLUTICASONE PROPIONATE 50 MCG/ACT NA SUSP
2.0000 | Freq: Every day | NASAL | Status: DC
  Administered 2016-02-22 – 2016-02-25 (×4): 2 via NASAL
  Filled 2016-02-22: qty 16

## 2016-02-22 MED ORDER — ACETAMINOPHEN 650 MG RE SUPP: 650.0000 mg | RECTAL | Status: DC | PRN

## 2016-02-22 MED ORDER — STROKE: EARLY STAGES OF RECOVERY BOOK
Freq: Once | Status: AC
  Administered 2016-02-22: 17:00:00
  Filled 2016-02-22: qty 1

## 2016-02-22 MED ORDER — MORPHINE SULFATE (PF) 2 MG/ML IV SOLN
0.5000 mg | Freq: Once | INTRAVENOUS | Status: AC
  Administered 2016-02-22: 0.5 mg via INTRAVENOUS
  Filled 2016-02-22: qty 1

## 2016-02-22 MED ORDER — ACETAMINOPHEN 325 MG PO TABS
650.0000 mg | ORAL_TABLET | ORAL | Status: DC | PRN
  Administered 2016-02-22 – 2016-02-24 (×2): 650 mg via ORAL
  Filled 2016-02-22 (×2): qty 2

## 2016-02-22 MED ORDER — SIMVASTATIN 20 MG PO TABS
20.0000 mg | ORAL_TABLET | Freq: Every day | ORAL | Status: DC
  Administered 2016-02-22 – 2016-02-23 (×2): 20 mg via ORAL
  Filled 2016-02-22 (×2): qty 1

## 2016-02-22 MED ORDER — MOMETASONE FURO-FORMOTEROL FUM 100-5 MCG/ACT IN AERO
2.0000 | INHALATION_SPRAY | Freq: Two times a day (BID) | RESPIRATORY_TRACT | Status: DC
  Administered 2016-02-22 – 2016-02-27 (×9): 2 via RESPIRATORY_TRACT
  Filled 2016-02-22 (×2): qty 8.8

## 2016-02-22 NOTE — Progress Notes (Signed)
Pt continue to report left leg pain with spasm; prn tylenol administered ineffective; on-call MD notified and new orders received. Pt report feeling better and resting comfortably in bed with call light within reach. Will continue to monitor. Delia Heady RN

## 2016-02-22 NOTE — H&P (Signed)
History and Physical    [name] [id number] DOB: 07/11/1931 DOA: 02/22/2016   PCP: Kandice Hams, MD   Patient coming from/Resides with: Private residence/lives with wife  Admission status: Observation/telemetry -it may be medically necessary to stay a minimum 2 midnights to rule out impending and/or unexpected changes in physiologic status that may differ from initial evaluation performed in the ER and/or at time of admission therefore consider reevaluation of admission status in 24 hours.   Chief Complaint: Left-sided weakness with facial drooping and slurred speech  HPI: [name] is a 83 y.o. male with medical history significant for remote CVA on warfarin without residual deficits, diet-controlled diabetes, dyslipidemia, mild asthma, and recent admission for lower GI bleeding December 2017 felt to be diverticular in nature. Patient began developing strokelike symptoms around 11:30 this morning consisting of slurred speech which was very mild, left-sided weakness, facial droop on the left and mild gait disturbance. Of note patient had brief discontinuation of Coumadin last month for GI bleeding and resumed on 12/17. INR earlier in the week was 1.9 and today INR 2.13. Patient has been evaluated by neurology and because he is on chronic anticoagulation he is not a candidate for thrombolytic therapies.  ED Course:  Vital Signs: BP (!) 162/81 (BP Location: Right Arm)   Pulse 69   Temp 97.9 F (36.6 C) (Oral)   Resp 20   Ht 5\' 6"  (1.676 m)   Wt 76.7 kg (169 lb 1.5 oz)   SpO2 98%   BMI 27.29 kg/m  CT head without contrast: No acute findings Lab data: Sodium 137, potassium 4.0, CO2 23, BUN 18, creatinine 0.71, glucose 88, LFTs normal, poc troponin 0.00, white count 13,300 with normal differential, hemoglobin 14.1, platelets 373,000, PT 23.3, INR 2.3, PTT 36, urinalysis unremarkable, alcohol level less than 5, urine drug screen negative Medications and treatments: None  Review  of Systems:  In addition to the HPI above,  No Fever-chills, myalgias or other constitutional symptoms No Headache, changes with Vision or hearing, tingling, numbness in any extremity, dizziness, dysarthria or word finding difficulty, tremors or seizure activity No problems swallowing food or Liquids, indigestion/reflux, choking or coughing while eating, abdominal pain with or after eating No Chest pain, Cough or Shortness of Breath, palpitations, orthopnea or DOE No Abdominal pain, N/V, melena,hematochezia, dark tarry stools, constipation No dysuria, malodorous urine, hematuria or flank pain No new skin rashes, lesions, masses or bruises, No new joint pains, aches, swelling or redness No recent unintentional weight gain or loss No polyuria, polydypsia or polyphagia   Past Medical History:  Diagnosis Date  . Acute CVA (cerebrovascular accident) ([place]) 07/25/2015  . Asthma   . Cerebral infarction due to embolism of left middle cerebral artery ([place]) 02/03/2014  . Colitis   . Colon polyps   . Diabetes mellitus type 2, diet-controlled ([place]) 12/22/2013  . Diverticulosis   . GERD (gastroesophageal reflux disease)   . Hemorrhoids   . Hiatal hernia   . History of esophageal strciture   . HLD (hyperlipidemia)   . Osteoarthritis   . Proctitis   . Status post dilation of esophageal narrowing   . Stroke [facility])     Past Surgical History:  Procedure Laterality Date  . ABDOMINAL HERNIA REPAIR    . esophageal hernia    . INSERTION OF MESH  01/29/2012   Procedure: INSERTION OF MESH;  Surgeon: Gwenyth Ober, MD;  Location: [place];  Service: General;  Laterality: N/A;  . nissen    .  SPLENECTOMY, TOTAL     nontraumatic rupture  . VENTRAL HERNIA REPAIR  01/29/2012    WITH MESH  . VENTRAL HERNIA REPAIR  01/29/2012   Procedure: HERNIA REPAIR VENTRAL ADULT;  Surgeon: Gwenyth Ober, MD;  Location: [place];  Service: General;  Laterality: N/A;  open recurrent ventral hernia repair with mesh    Social  History   Social History  . Marital status: Married    Spouse name: [name]  . Number of children: 2  . Years of education: Bachelor's   Occupational History  . retired    Social History Main Topics  . Smoking status: Never Smoker  . Smokeless tobacco: Never Used  . Alcohol use 0.6 oz/week    1 Glasses of wine per week     Comment: daily  . Drug use: No  . Sexual activity: Not on file   Other Topics Concern  . Not on file   Social History Narrative   Patient is married and has 2 children.   Patient is right handed.   Patient has Bachelor's degree.   Patient drinks 2 cups daily.    Mobility: Without assistive devices Work history: Not obtained   No Known Allergies  Family History  Problem Relation Age of Onset  . Cancer Father      Prior to Admission medications   Medication Sig Start Date End Date Taking? Authorizing Provider  fluticasone (FLONASE) 50 MCG/ACT nasal spray Place 2 sprays into both nostrils daily. 06/18/14   Historical Provider, MD  hydrocortisone (ANUSOL-HC) 25 MG suppository Place 1 suppository (25 mg total) rectally 2 (two) times daily. 02/01/16   Venetia Maxon Rama, MD  loratadine (CLARITIN) 10 MG tablet Take 10 mg by mouth daily.    Historical Provider, MD  mometasone-formoterol (DULERA) 100-5 MCG/ACT AERO Inhale 2 puffs into the lungs 2 (two) times daily. 12/12/15   Tanda Rockers, MD  montelukast (SINGULAIR) 10 MG tablet Take 1 tablet (10 mg total) by mouth at bedtime. 10/31/15   Tanda Rockers, MD  simvastatin (ZOCOR) 20 MG tablet Take 20 mg by mouth daily.    Historical Provider, MD  warfarin (COUMADIN) 5 MG tablet Take 5 mg by mouth daily. Managed by [place] Provider, MD    Physical Exam: Vitals:   02/22/16 1400 02/22/16 1415 02/22/16 1442 02/22/16 1505  BP: 133/84 136/88  (!) 162/81  Pulse: 68 71  69  Resp: 20 14  20   Temp:   97.8 F (36.6 C) 97.9 F (36.6 C)  TempSrc:    Oral  SpO2: 95% 96%  98%  Weight:      Height:           Constitutional: NAD, calm, comfortable Eyes: PERRL, lids and conjunctivae normal ENMT: Mucous membranes are moist. Posterior pharynx clear of any exudate or lesions.Normal dentition.  Neck: normal, supple, no masses, no thyromegaly Respiratory: clear to auscultation bilaterally, no wheezing, no crackles. Normal respiratory effort. No accessory muscle use.  Cardiovascular: Regular rate and rhythm, no murmurs / rubs / gallops. No extremity edema. 2+ pedal pulses. No carotid bruits.  Abdomen: no tenderness, no masses palpated. No hepatosplenomegaly. Bowel sounds positive.  Musculoskeletal: no clubbing / cyanosis. No joint deformity upper and lower extremities. Good ROM, no contractures. Normal muscle tone.  Skin: no rashes, lesions, ulcers. No induration Neurologic: CN 2-12 grossly intact with only subtle left facial drooping. Sensation intact, DTR normal. Strength 5/5 on right. Definite left dysmetria with finger to  nose testing, flexor resistance/extensor resistance weak at 3/5-patient reports hip pain with attempts to lift left leg but was able to lift about 2 inches off the bed but quickly returned leg to resting position on stretcher Psychiatric: Normal judgment and insight. Alert and oriented x 3. Normal mood.    Labs on Admission: I have personally reviewed following labs and imaging studies  CBC:  Recent Labs Lab 02/22/16 1306 02/22/16 1309  WBC 13.2*  --   NEUTROABS 6.3  --   HGB 14.1 15.0  HCT 42.5 44.0  MCV 94.0  --   PLT 373  --    Basic Metabolic Panel:  Recent Labs Lab 02/22/16 1306 02/22/16 1309  NA 137 138  K 4.0 4.0  CL 104 103  CO2 23  --   GLUCOSE 88 87  BUN 14 15  CREATININE 0.71 0.70  CALCIUM 8.4*  --    GFR: Estimated Creatinine Clearance: 69.5 mL/min (by C-G formula based on SCr of 0.7 mg/dL). Liver Function Tests:  Recent Labs Lab 02/22/16 1306  AST 20  ALT 14*  ALKPHOS 67  BILITOT 0.5  PROT 6.6  ALBUMIN 3.5   No results for  input(s): LIPASE, AMYLASE in the last 168 hours. No results for input(s): AMMONIA in the last 168 hours. Coagulation Profile:  Recent Labs Lab 02/22/16 1306  INR 2.03   Cardiac Enzymes: No results for input(s): CKTOTAL, CKMB, CKMBINDEX, TROPONINI in the last 168 hours. BNP (last 3 results) No results for input(s): PROBNP in the last 8760 hours. HbA1C: No results for input(s): HGBA1C in the last 72 hours. CBG:  Recent Labs Lab 02/22/16 1304  GLUCAP 84   Lipid Profile: No results for input(s): CHOL, HDL, LDLCALC, TRIG, CHOLHDL, LDLDIRECT in the last 72 hours. Thyroid Function Tests: No results for input(s): TSH, T4TOTAL, FREET4, T3FREE, THYROIDAB in the last 72 hours. Anemia Panel: No results for input(s): VITAMINB12, FOLATE, FERRITIN, TIBC, IRON, RETICCTPCT in the last 72 hours. Urine analysis:    Component Value Date/Time   COLORURINE YELLOW 02/22/2016 1340   APPEARANCEUR CLEAR 02/22/2016 1340   LABSPEC 1.013 02/22/2016 1340   PHURINE 6.0 02/22/2016 1340   GLUCOSEU NEGATIVE 02/22/2016 1340   HGBUR NEGATIVE 02/22/2016 1340   BILIRUBINUR NEGATIVE 02/22/2016 1340   KETONESUR NEGATIVE 02/22/2016 1340   PROTEINUR NEGATIVE 02/22/2016 1340   UROBILINOGEN 0.2 12/22/2013 1210   NITRITE NEGATIVE 02/22/2016 1340   LEUKOCYTESUR NEGATIVE 02/22/2016 1340   Sepsis Labs: @LABRCNTIP (procalcitonin:4,lacticidven:4) )No results found for this or any previous visit (from the past 240 hour(s)).   Radiological Exams on Admission: Ct Head Code Stroke W/o Cm  Result Date: 02/22/2016 CLINICAL DATA:  Code stroke. Left-sided weakness. Decreased coordination with tingling. EXAM: CT HEAD WITHOUT CONTRAST TECHNIQUE: Contiguous axial images were obtained from the base of the skull through the vertex without intravenous contrast. COMPARISON:  07/25/2015 FINDINGS: Brain: No evidence of acute infarction, hemorrhage, hydrocephalus, extra-axial collection or mass lesion/mass effect. Chronic  microvascular disease with ischemic gliosis throughout the cerebral white matter. When compared to prior there are remote lacunar infarcts in the anterior limb right internal capsule and posterior right putamen. Vascular: Atherosclerotic calcification.  No hyperdense vessel. Skull: No acute or aggressive finding Sinuses/Orbits: Advanced sinusitis with diffuse mucosal thickening, essentially complete in bilateral ethmoid and frontal sinuses. Bilateral mastoid opacification, left more than right. Other: Text page was sent at the time of interpretation on 02/22/2016 at 1:17 pm to Dr. Roland Rack , who subsequently verbally acknowledged these results. ASPECTS (  [place] Stroke Program Early CT Score) - Ganglionic level infarction (caudate, lentiform nuclei, internal capsule, insula, M1-M3 cortex): 7 when accounting for chronic changes. - Supraganglionic infarction (M4-M6 cortex): 3 Total score (0-10 with 10 being normal): 10 IMPRESSION: 1. No acute finding. 2. Chronic microvascular disease, moderate. 3. Advanced chronic sinusitis. 4. Left more than right mastoid opacification which has developed since June 2017. Electronically Signed   By: Monte Fantasia M.D.   On: 02/22/2016 13:33    EKG: (Independently reviewed) sinus rhythm with ventricular rate 79 bpm, QTC 445 ms, no definitive acute ischemic changes  Assessment/Plan Principal Problem:   Stroke-like symptom/History of CVA (cerebrovascular accident) -Patient presents with acute onset of left-sided CVA symptoms occurring while on chronic anticoagulation -Not candidate for TPA -Initial CT head negative so obtain stat MRI/MRA -Echocardiogram and carotid duplex -Neurology following -Risk factor stratification: Hemoglobin A1c/lipid panel -PT/OT/SLP evaluation -Frequent neurological checks -Coumadin for anticoagulation; defer to neurology as to whether antiplatelet such as aspirin indicated  Active Problems:   History of lower GI bleeding Dec  2017 -No further recurrence of symptoms since discharge -Hemoglobin stable at 14    Diabetes mellitus type 2, diet-controlled -Current CBG control -Follow-up on hemoglobin A1c    Chronic anticoagulation 2/2 history of CVA -Warfarin per pharmacy -Current INR 2.13    Leukocytosis -No fever and no infectious symptoms reported -Urinalysis unremarkable -Likely reactive in nature    HLD (hyperlipidemia) -Continue Zocor    Cough variant asthma -Continue Flonase and Dulera      DVT prophylaxis: Warfarin Code Status: DO NOT RESUSCITATE Family Communication: Wife at bedside  Disposition Plan: Anticipate discharge back to preadmission home environment when medically stable Consults called: Neurology/[place]    Zadyn Yardley L. ANP-BC [facility] Pager [phone]   If 7PM-7AM, please contact night-coverage [email or web address] Password [email or web address]  02/22/2016, 3:18 PM

## 2016-02-22 NOTE — Progress Notes (Signed)
Code stroke called at 1236, Patient arrived to [facility] ED via [facility] at 1302.  As per Patient LSN 1130, when he suddenly developed left side weakness, facial droop and problems with coordination.  NIHSS 5.  Patient takes blood thinners and has had a recent GI BLeed in December.  INR 2.03

## 2016-02-22 NOTE — Progress Notes (Signed)
ED report received at 1440 and pt arrived to the unit at 1505. Pt A&O x 4; pt oriented to the unit and room; fall/safety precaution and prevention education completed with pt and spouse; VSS; telemetry applied and verified with CCMD; NT called to second verify. Pt skin clean, dry and intact, no open wounds or pressure ulcer noted. Bed alarm on; call light within reach and will continue to closely monitor. Delia Heady RN

## 2016-02-22 NOTE — Progress Notes (Signed)
Pt transported off unit to MRI for procedure. Delia Heady RN

## 2016-02-22 NOTE — Consult Note (Addendum)
Neurology Consultation Reason for Consult: stroke Referring Physician: little, R  CC: Left sided weakness  History is obtained from: Patient  HPI: [name] is a 83 y.o. male who presents with new onset left-sided weakness and ataxia that started at 11:15 AM. He was completely normal prior to this. It is not static deficit, no progression or improvement.   LKW: 11:15 AM tpa given?: no, INR 2   ROS: A 14 point ROS was performed and is negative except as noted in the HPI.   Past Medical History:  Diagnosis Date  . Acute CVA (cerebrovascular accident) ([place]) 07/25/2015  . Asthma   . Cerebral infarction due to embolism of left middle cerebral artery ([place]) 02/03/2014  . Colitis   . Colon polyps   . Diabetes mellitus type 2, diet-controlled ([place]) 12/22/2013  . Diverticulosis   . GERD (gastroesophageal reflux disease)   . Hemorrhoids   . Hiatal hernia   . History of esophageal strciture   . HLD (hyperlipidemia)   . Osteoarthritis   . Proctitis   . Status post dilation of esophageal narrowing   . Stroke [facility])      Family History  Problem Relation Age of Onset  . Cancer Father      Social History:  reports that he has never smoked. He has never used smokeless tobacco. He reports that he drinks alcohol. He reports that he does not use drugs.   Exam: Current vital signs: BP 147/83   Pulse 79   Resp 23   Ht 5\' 6"  (1.676 m)   Wt 76.7 kg (169 lb 1.5 oz)   SpO2 (!) 82%   BMI 27.29 kg/m  Vital signs in last 24 hours: Pulse Rate:  [66-79] 79 (01/06 1322) Resp:  [21-23] 23 (01/06 1322) BP: (147)/(83) 147/83 (01/06 1317) SpO2:  [82 %-98 %] 82 % (01/06 1317) Weight:  [76.7 kg (169 lb 1.5 oz)] 76.7 kg (169 lb 1.5 oz) (01/06 1319)   Physical Exam  Constitutional: Appears well-developed and well-nourished.  Psych: Affect appropriate to situation Eyes: No scleral injection HENT: No OP obstrucion Head: Normocephalic.  Cardiovascular: Normal rate and regular rhythm.   Respiratory: Effort normal and breath sounds normal to anterior ascultation GI: Soft.  No distension. There is no tenderness.  Skin: WDI  Neuro: Mental Status: Patient is awake, alert, oriented to person, place, month, year, and situation. Patient is able to give a clear and coherent history. No signs of aphasia or neglect Cranial Nerves: II: Visual Fields are full. Pupils are equal, round, and reactive to light.   III,IV, VI: EOMI without ptosis or diploplia.  V: Facial sensation is symmetric to temperature VII: Facial movement is weak on the left VIII: hearing is intact to voice X: Uvula elevates symmetrically XI: Shoulder shrug is symmetric. XII: tongue is midline without atrophy or fasciculations.  Motor: Tone is normal. Bulk is normal. 5/5 strength was present on the right, on the left he has mild drift in the arm and leg  Sensory: Sensation is symmetric to light touch Cerebellar: He has marked ataxia in the left arm, consistent with weakness and left leg  I have reviewed labs in epic and the results pertinent to this consultation are: Chem 8-unremarkable  I have reviewed the images obtained: CT head-no acute findings  Impression: 83 year old male with acute infarct in the setting of recent subtherapeutic INR. His INR is to high to treat currently.  Recommendations: 1. HgbA1c, fasting lipid panel 2. MRI, MRA  of the brain without contrast 3. Frequent neuro checks 4. Echocardiogram 5. Carotid dopplers 6. Prophylactic therapy-coumadin 7. Risk factor modification 8. Telemetry monitoring 9. PT consult, OT consult, Speech consult 10. please page stroke NP  Or  PA  Or MD  from 8am -4 pm starting 1/7 as this patient will be followed by the stroke team at this point.   You can look them up on [email or web address]     Roland Rack, MD [facility] [phone]  If 7pm- 7am, please page neurology on call as listed in [place].

## 2016-02-22 NOTE — ED Triage Notes (Signed)
Pt in from independent living via [facility] with stroke-like symptoms starting at 1130. Pt states he noticed slurred speech and L sided weakness. Pt takes blood thinners. Pt arrives alert, a&ox4. CBG 81 on arrival

## 2016-02-22 NOTE — Progress Notes (Signed)
Pt c/o left groin pain which he said to be something new which started today. Dr. Aggie Moats notified and MD said he will come assess pt. Will continue to monitor pt. Delia Heady RN

## 2016-02-22 NOTE — ED Provider Notes (Signed)
[facility] DEPT Provider Note   CSN: [id number] Arrival date & time: 02/22/16 1302     History    Chief Complaint  Patient presents with  . Code Stroke     HPI [name] is a 83 y.o. male.  83yo M w/ PMH including CVA, T2DM, coumadin use, recent GIB who p/w Left-sided weakness. Starting at 11:30 AM this morning, patient was at his living facility when he noticed some left-sided weakness and difficulty walking as well as some slurred speech. He was brought in by EMS and made a code stroke. He denies any numbness currently, no headache or visual changes. He denies any complaints of pain. No fevers or recent illness. He was hospitalized a few weeks ago for GI bleed but has been doing well since discharge.   Past Medical History:  Diagnosis Date  . Acute CVA (cerebrovascular accident) ([place]) 07/25/2015  . Asthma   . Cerebral infarction due to embolism of left middle cerebral artery ([place]) 02/03/2014  . Colitis   . Colon polyps   . Diabetes mellitus type 2, diet-controlled ([place]) 12/22/2013  . Diverticulosis   . GERD (gastroesophageal reflux disease)   . Hemorrhoids   . Hiatal hernia   . History of esophageal strciture   . HLD (hyperlipidemia)   . Osteoarthritis   . Proctitis   . Status post dilation of esophageal narrowing   . Stroke [facility])      Patient Active Problem List   Diagnosis Date Noted  . Acute lower GI bleeding 02/01/2016  . Lower GI bleed 02/01/2016  . Chronic anticoagulation 01/31/2016  . History of CVA (cerebrovascular accident) 01/31/2016  . Lower GI bleeding 01/30/2016  . Sinusitis, chronic 12/12/2015  . Cough variant asthma 10/31/2015  . Insomnia 07/25/2015  . COPD bronchitis 07/25/2015  . Diabetes mellitus with complication ([place])   . Type 2 diabetes mellitus without complication ([place]) 123456  . HLD (hyperlipidemia) 02/03/2014  . Ulnar neuropathy at elbow of right upper extremity 02/03/2014  . Cervical radiculopathy 02/03/2014  . History of  lower GI bleeding 01/29/2014  . Seizures ([place])   . Diabetes mellitus type 2, diet-controlled ([place]) 12/22/2013  . History of TIA   . Recurrent ventral hernia 12/15/2011  . History of esophageal strciture   . Dyslipidemia 06/24/2007  . STROKE 06/24/2007    Past Surgical History:  Procedure Laterality Date  . ABDOMINAL HERNIA REPAIR    . esophageal hernia    . INSERTION OF MESH  01/29/2012   Procedure: INSERTION OF MESH;  Surgeon: Gwenyth Ober, MD;  Location: [facility];  Service: General;  Laterality: N/A;  . nissen    . SPLENECTOMY, TOTAL     nontraumatic rupture  . VENTRAL HERNIA REPAIR  01/29/2012    WITH MESH  . VENTRAL HERNIA REPAIR  01/29/2012   Procedure: HERNIA REPAIR VENTRAL ADULT;  Surgeon: Gwenyth Ober, MD;  Location: [facility];  Service: General;  Laterality: N/A;  open recurrent ventral hernia repair with mesh        Home Medications    Prior to Admission medications   Medication Sig Start Date End Date Taking? Authorizing Provider  fluticasone (FLONASE) 50 MCG/ACT nasal spray Place 2 sprays into both nostrils daily. 06/18/14   Historical Provider, MD  hydrocortisone (ANUSOL-HC) 25 MG suppository Place 1 suppository (25 mg total) rectally 2 (two) times daily. 02/01/16   Venetia Maxon Rama, MD  loratadine (CLARITIN) 10 MG tablet Take 10 mg by mouth daily.  Historical Provider, MD  mometasone-formoterol (DULERA) 100-5 MCG/ACT AERO Inhale 2 puffs into the lungs 2 (two) times daily. 12/12/15   Tanda Rockers, MD  montelukast (SINGULAIR) 10 MG tablet Take 1 tablet (10 mg total) by mouth at bedtime. 10/31/15   Tanda Rockers, MD  simvastatin (ZOCOR) 20 MG tablet Take 20 mg by mouth daily.    Historical Provider, MD  warfarin (COUMADIN) 5 MG tablet Take 5 mg by mouth daily. Managed by [place] Provider, MD      Family History  Problem Relation Age of Onset  . Cancer Father      Social History  Substance Use Topics  . Smoking status: Never Smoker  . Smokeless  tobacco: Never Used  . Alcohol use 0.0 oz/week     Comment: 1 1/2 - 2 daily     Allergies     Patient has no known allergies.    Review of Systems  10 Systems reviewed and are negative for acute change except as noted in the HPI.   Physical Exam Updated Vital Signs BP (!) 150/105   Pulse 75   Resp 22   Ht 5\' 6"  (1.676 m)   Wt 169 lb 1.5 oz (76.7 kg)   SpO2 95%   BMI 27.29 kg/m   Physical Exam  Constitutional: He is oriented to person, place, and time. He appears well-developed and well-nourished. No distress.  Awake, alert  HENT:  Head: Normocephalic and atraumatic.  Eyes: Conjunctivae and EOM are normal. Pupils are equal, round, and reactive to light.  Neck: Neck supple.  Cardiovascular: Normal rate, regular rhythm and normal heart sounds.   No murmur heard. Pulmonary/Chest: Effort normal and breath sounds normal. No respiratory distress.  Abdominal: Soft. Bowel sounds are normal. He exhibits no distension. There is no tenderness.  Musculoskeletal: He exhibits no edema.  Neurological: He is alert and oriented to person, place, and time. He has normal reflexes. No cranial nerve deficit. He exhibits normal muscle tone.  Fluent speech, normal finger-to-nose testing R with dysmetria on L  5/5 strength RUE, RLE 4+/5 strength LUE, LLE + pronator drift on L normal sensation x all 4 extremities  Skin: Skin is warm and dry.  Psychiatric: He has a normal mood and affect. Judgment and thought content normal.  Nursing note and vitals reviewed.     ED Treatments / Results  Labs (all labs ordered are listed, but only abnormal results are displayed) Labs Reviewed  PROTIME-INR - Abnormal; Notable for the following:       Result Value   Prothrombin Time 23.3 (*)    All other components within normal limits  CBC - Abnormal; Notable for the following:    WBC 13.2 (*)    All other components within normal limits  DIFFERENTIAL - Abnormal; Notable for the following:     Monocytes Absolute 1.3 (*)    Eosinophils Absolute 2.2 (*)    All other components within normal limits  I-STAT CHEM 8, ED - Abnormal; Notable for the following:    Calcium, Ion 1.10 (*)    All other components within normal limits  APTT  ETHANOL  COMPREHENSIVE METABOLIC PANEL  RAPID URINE DRUG SCREEN, HOSP PERFORMED  URINALYSIS, ROUTINE W REFLEX MICROSCOPIC  CBG MONITORING, ED  Randolm Idol, ED     EKG  EKG Interpretation  Date/Time:  Saturday February 22 2016 13:19:27 EST Ventricular Rate:  79 PR Interval:    QRS Duration: 91 QT Interval:  388 QTC Calculation: 445 R Axis:   -37 Text Interpretation:  Sinus rhythm Borderline prolonged PR interval Left axis deviation Baseline wander in lead(s) V6 No significant change since last tracing Confirmed by Coy Rochford MD, Juliet Vasbinder [phone]) on 02/22/2016 2:03:48 PM         Radiology Ct Head Code Stroke W/o Cm  Result Date: 02/22/2016 CLINICAL DATA:  Code stroke. Left-sided weakness. Decreased coordination with tingling. EXAM: CT HEAD WITHOUT CONTRAST TECHNIQUE: Contiguous axial images were obtained from the base of the skull through the vertex without intravenous contrast. COMPARISON:  07/25/2015 FINDINGS: Brain: No evidence of acute infarction, hemorrhage, hydrocephalus, extra-axial collection or mass lesion/mass effect. Chronic microvascular disease with ischemic gliosis throughout the cerebral white matter. When compared to prior there are remote lacunar infarcts in the anterior limb right internal capsule and posterior right putamen. Vascular: Atherosclerotic calcification.  No hyperdense vessel. Skull: No acute or aggressive finding Sinuses/Orbits: Advanced sinusitis with diffuse mucosal thickening, essentially complete in bilateral ethmoid and frontal sinuses. Bilateral mastoid opacification, left more than right. Other: Text page was sent at the time of interpretation on 02/22/2016 at 1:17 pm to Dr. Roland Rack , who subsequently  verbally acknowledged these results. ASPECTS [facility] Stroke Program Early CT Score) - Ganglionic level infarction (caudate, lentiform nuclei, internal capsule, insula, M1-M3 cortex): 7 when accounting for chronic changes. - Supraganglionic infarction (M4-M6 cortex): 3 Total score (0-10 with 10 being normal): 10 IMPRESSION: 1. No acute finding. 2. Chronic microvascular disease, moderate. 3. Advanced chronic sinusitis. 4. Left more than right mastoid opacification which has developed since June 2017. Electronically Signed   By: Monte Fantasia M.D.   On: 02/22/2016 13:33    Procedures Procedures (including critical care time) .Critical Care Performed by: Sharlett Iles Authorized by: Sharlett Iles   Critical care provider statement:    Critical care time (minutes):  45   Critical care time was exclusive of:  Separately billable procedures and treating other patients   Critical care was necessary to treat or prevent imminent or life-threatening deterioration of the following conditions:  CNS failure or compromise   Critical care was time spent personally by me on the following activities:  Development of treatment plan with patient or surrogate, discussions with consultants, examination of patient, obtaining history from patient or surrogate, ordering and review of laboratory studies, ordering and review of radiographic studies, re-evaluation of patient's condition and review of old charts    Medications Ordered in ED  Medications - No data to display   Initial Impression / Assessment and Plan / ED Course  I have reviewed the triage vital signs and the nursing notes.  Pertinent labs & imaging results that were available during my care of the patient were reviewed by me and considered in my medical decision making (see chart for details).  Clinical Course     Pt w/ acute onset of left-sided weakness as well as speech difficulty at 11:30 AM. A code stroke was called on  arrival.  He was taken to CT scanner where head CT was negative acute. He was evaluated by neurology, Dr. Leonel Ramsay, I appreciate his assistance with the patient's care. His symptoms are consistent with an acute ischemic stroke however his Coumadin level is 2 and he has had recent GI bleed. Given these facts as well as his mild symptoms, Dr. Leonel Ramsay and is not giving TPA. Labs here are reassuring. Discussed admission for stroke workup with Ebony Hail, [facility] hospitalist, and patient admitted for further care.  Final Clinical Impressions(s) / ED Diagnoses   Final diagnoses:  Acute ischemic stroke ([place])  Left-sided weakness     New Prescriptions   No medications on file       Sharlett Iles, MD 02/22/16 [phone]

## 2016-02-22 NOTE — Progress Notes (Addendum)
[facility] for warfarin Indication: CVA   Assessment: 58 yom admitted with stroke-like symptoms. History of CVA on warfarin PTA. Also with recent admission for GIB in 01/2016. Not a candidate for TPA. INR therapeutic 2.03 on admit. CBC wnl, no bleed documented.  PTA warfarin dose: 5mg  except 2.5 on Sun (last dose 1/6 PTA)  Goal of Therapy:  INR 2-3 Monitor platelets by anticoagulation protocol: Yes   Plan:  No warfarin tonight as has already taken and INR therapeutic Daily INR - may need higher goal but has hx of bleeding? Monitor CBC, s/sx bleeding  Elicia Lamp, PharmD, BCPS Clinical Pharmacist 02/22/2016 3:52 PM

## 2016-02-23 ENCOUNTER — Inpatient Hospital Stay (HOSPITAL_COMMUNITY): Payer: Medicare HMO

## 2016-02-23 DIAGNOSIS — R278 Other lack of coordination: Secondary | ICD-10-CM | POA: Diagnosis not present

## 2016-02-23 DIAGNOSIS — Z8673 Personal history of transient ischemic attack (TIA), and cerebral infarction without residual deficits: Secondary | ICD-10-CM | POA: Diagnosis not present

## 2016-02-23 DIAGNOSIS — R2990 Unspecified symptoms and signs involving the nervous system: Secondary | ICD-10-CM

## 2016-02-23 DIAGNOSIS — I6789 Other cerebrovascular disease: Secondary | ICD-10-CM | POA: Diagnosis not present

## 2016-02-23 DIAGNOSIS — Z66 Do not resuscitate: Secondary | ICD-10-CM | POA: Diagnosis not present

## 2016-02-23 DIAGNOSIS — Z9081 Acquired absence of spleen: Secondary | ICD-10-CM | POA: Diagnosis not present

## 2016-02-23 DIAGNOSIS — M62838 Other muscle spasm: Secondary | ICD-10-CM

## 2016-02-23 DIAGNOSIS — I5040 Unspecified combined systolic (congestive) and diastolic (congestive) heart failure: Secondary | ICD-10-CM | POA: Diagnosis not present

## 2016-02-23 DIAGNOSIS — E663 Overweight: Secondary | ICD-10-CM | POA: Diagnosis not present

## 2016-02-23 DIAGNOSIS — G2581 Restless legs syndrome: Secondary | ICD-10-CM | POA: Diagnosis not present

## 2016-02-23 DIAGNOSIS — I638 Other cerebral infarction: Secondary | ICD-10-CM | POA: Diagnosis not present

## 2016-02-23 DIAGNOSIS — R270 Ataxia, unspecified: Secondary | ICD-10-CM | POA: Diagnosis present

## 2016-02-23 DIAGNOSIS — I459 Conduction disorder, unspecified: Secondary | ICD-10-CM | POA: Diagnosis not present

## 2016-02-23 DIAGNOSIS — M6281 Muscle weakness (generalized): Secondary | ICD-10-CM | POA: Diagnosis not present

## 2016-02-23 DIAGNOSIS — E119 Type 2 diabetes mellitus without complications: Secondary | ICD-10-CM | POA: Diagnosis not present

## 2016-02-23 DIAGNOSIS — I110 Hypertensive heart disease with heart failure: Secondary | ICD-10-CM | POA: Diagnosis not present

## 2016-02-23 DIAGNOSIS — R41841 Cognitive communication deficit: Secondary | ICD-10-CM | POA: Diagnosis not present

## 2016-02-23 DIAGNOSIS — I639 Cerebral infarction, unspecified: Secondary | ICD-10-CM | POA: Diagnosis not present

## 2016-02-23 DIAGNOSIS — R791 Abnormal coagulation profile: Secondary | ICD-10-CM | POA: Diagnosis present

## 2016-02-23 DIAGNOSIS — R2689 Other abnormalities of gait and mobility: Secondary | ICD-10-CM | POA: Diagnosis not present

## 2016-02-23 DIAGNOSIS — R471 Dysarthria and anarthria: Secondary | ICD-10-CM | POA: Diagnosis not present

## 2016-02-23 DIAGNOSIS — R29810 Facial weakness: Secondary | ICD-10-CM | POA: Diagnosis present

## 2016-02-23 DIAGNOSIS — G8194 Hemiplegia, unspecified affecting left nondominant side: Secondary | ICD-10-CM | POA: Diagnosis not present

## 2016-02-23 DIAGNOSIS — R29705 NIHSS score 5: Secondary | ICD-10-CM | POA: Diagnosis present

## 2016-02-23 DIAGNOSIS — Z6827 Body mass index (BMI) 27.0-27.9, adult: Secondary | ICD-10-CM | POA: Diagnosis not present

## 2016-02-23 DIAGNOSIS — E785 Hyperlipidemia, unspecified: Secondary | ICD-10-CM | POA: Diagnosis not present

## 2016-02-23 DIAGNOSIS — I69354 Hemiplegia and hemiparesis following cerebral infarction affecting left non-dominant side: Secondary | ICD-10-CM | POA: Diagnosis not present

## 2016-02-23 DIAGNOSIS — R531 Weakness: Secondary | ICD-10-CM | POA: Diagnosis not present

## 2016-02-23 DIAGNOSIS — Z7901 Long term (current) use of anticoagulants: Secondary | ICD-10-CM | POA: Diagnosis not present

## 2016-02-23 DIAGNOSIS — I5042 Chronic combined systolic (congestive) and diastolic (congestive) heart failure: Secondary | ICD-10-CM | POA: Diagnosis not present

## 2016-02-23 DIAGNOSIS — J45991 Cough variant asthma: Secondary | ICD-10-CM | POA: Diagnosis present

## 2016-02-23 DIAGNOSIS — R4781 Slurred speech: Secondary | ICD-10-CM | POA: Diagnosis present

## 2016-02-23 DIAGNOSIS — E1149 Type 2 diabetes mellitus with other diabetic neurological complication: Secondary | ICD-10-CM | POA: Diagnosis not present

## 2016-02-23 DIAGNOSIS — R2681 Unsteadiness on feet: Secondary | ICD-10-CM | POA: Diagnosis not present

## 2016-02-23 DIAGNOSIS — D72829 Elevated white blood cell count, unspecified: Secondary | ICD-10-CM | POA: Diagnosis not present

## 2016-02-23 LAB — LIPID PANEL
CHOL/HDL RATIO: 3 ratio
CHOLESTEROL: 179 mg/dL (ref 0–200)
HDL: 59 mg/dL (ref >40)
LDL Cholesterol: 99 mg/dL (ref 0–99)
TRIGLYCERIDES: 103 mg/dL (ref <150)
VLDL: 21 mg/dL (ref 0–40)

## 2016-02-23 LAB — PROTIME-INR
INR: 2.03
Prothrombin Time: 23.2 seconds — ABNORMAL HIGH (ref 11.4–15.2)

## 2016-02-23 MED ORDER — ZOLPIDEM TARTRATE 5 MG PO TABS
5.0000 mg | ORAL_TABLET | Freq: Every evening | ORAL | Status: DC | PRN
  Administered 2016-02-26: 5 mg via ORAL
  Filled 2016-02-23: qty 1

## 2016-02-23 MED ORDER — SIMVASTATIN 40 MG PO TABS
40.0000 mg | ORAL_TABLET | Freq: Every day | ORAL | Status: DC
  Administered 2016-02-24 – 2016-02-26 (×3): 40 mg via ORAL
  Filled 2016-02-23 (×3): qty 1

## 2016-02-23 MED ORDER — WARFARIN SODIUM 5 MG PO TABS
5.0000 mg | ORAL_TABLET | Freq: Once | ORAL | Status: AC
  Administered 2016-02-23: 5 mg via ORAL
  Filled 2016-02-23: qty 1

## 2016-02-23 MED ORDER — MORPHINE SULFATE (PF) 2 MG/ML IV SOLN
2.0000 mg | Freq: Once | INTRAVENOUS | Status: AC
  Administered 2016-02-23: 2 mg via INTRAVENOUS
  Filled 2016-02-23: qty 1

## 2016-02-23 MED ORDER — WARFARIN - PHARMACIST DOSING INPATIENT
Freq: Every day | Status: DC
  Administered 2016-02-23: 17:00:00

## 2016-02-23 MED ORDER — ZOLPIDEM TARTRATE 5 MG PO TABS
5.0000 mg | ORAL_TABLET | Freq: Once | ORAL | Status: AC
  Administered 2016-02-23: 5 mg via ORAL
  Filled 2016-02-23: qty 1

## 2016-02-23 NOTE — Progress Notes (Signed)
ANTICOAGULATION CONSULT NOTE - Follow Up Consult  Pharmacy Consult for Warfarin Indication: CVA  Patient Measurements: Height: 5\' 6"  (167.6 cm) Weight: 169 lb 1.5 oz (76.7 kg) IBW/kg (Calculated) : 63.8  Labs:  Recent Labs  02/22/16 1306 02/22/16 1309 02/23/16 0651  HGB 14.1 15.0  --   HCT 42.5 44.0  --   PLT 373  --   --   APTT 36  --   --   LABPROT 23.3*  --  23.2*  INR 2.03  --  2.03  CREATININE 0.71 0.70  --     Estimated Creatinine Clearance: 69.5 mL/min (by C-G formula based on SCr of 0.7 mg/dL).   Assessment: 46 YOM admitted with stroke-like symptoms. History of CVA on warfarin PTA. Also with recent admission for GIB in 01/2016. Not a candidate for TPA. INR therapeutic 2.03 on admit.  INR today remains therapeutic (INR 2.03, goal of 2-3), CBC wnl and stable. No overt s/sx of bleeding noted.   Goal of Therapy:  INR 2-3   Plan:  1. Warfarin 5 mg x 1 dose at 1800 today 2. Will continue to monitor for any signs/symptoms of bleeding and will follow up with PT/INR in the a.m.   Thank you for allowing pharmacy to be a part of this patient's care.  Alycia Rossetti, PharmD, BCPS Clinical Pharmacist Pager: [phone] Clinical phone for 02/23/2016 from 7a-3:30p: [phone] If after 3:30p, please call main pharmacy at: [phone] 02/23/2016 11:46 AM

## 2016-02-23 NOTE — Progress Notes (Signed)
VASCULAR LAB PRELIMINARY  PRELIMINARY  PRELIMINARY  PRELIMINARY  Left lower extremity venous duplex completed.    Preliminary report:  There is no DVT or SVT noted in the left lower extremity.   Jamarii Banks, RVT 02/23/2016, 4:17 PM

## 2016-02-23 NOTE — Progress Notes (Addendum)
PROGRESS NOTE    ADYEN MIRAMON  [id number] DOB: [birth year] DOA: 02/22/2016 PCP: Kandice Hams, MD   Brief Narrative:  Patient admitted for left sided weakness while on therapeutic coumadin. Found to have acute right thalamic infarct.   It is my clinical opinion that admission to INPATIENT is reasonable and necessary in this 83 y.o. male . presenting with symptoms of continued left sided weakness, concerning for acute stroke  . in the context of PMH including: Dm, HLD . with pertinent positives on physical exam including: Left sided weakness as noted in the Physical exam  . and pertinent positives on radiographic and laboratory data including: Mri + for acute 13mm cva  . Workup and treatment include echo, carotid US, lab tests.   Given the aforementioned, the predictability of an adverse outcome is felt to be significant. I expect that the patient will require at least 2 midnights in the hospital to treat this condition.  . (To order inpatient, physicians should use the expectation that the patient will require at least 2 midnights in the hospital and the medical record supports that reasonable expectation.  . You must document the medical necessity to support the patient's inpatient admission with a brief synopsis of concerns, risks and expectations for the patient's hospital stay.  . Factors to consider when making the decision to admit include: . The severity of signs/symptoms . The medical predictability of something adverse happening to the patient   Clinical Example: Abdominal pain with concern for possible SBO in this 77yom with high risk PMH including recent abdominal surgery, DM, Crohn's disease and vascular insufficiency. I expect my patient to require a hospital stay of at least 2 midnights to treat this condition.)   Assessment & Plan:   Principal Problem:   Stroke-like symptom Active Problems:   Diabetes mellitus type 2, diet-controlled (HCC)   HLD  (hyperlipidemia)   Cough variant asthma   Chronic anticoagulation 2/2 history of CVA   History of CVA (cerebrovascular accident)   History of lower GI bleeding Dec 2017   Leukocytosis   Acute CVA (cerebrovascular accident) ([place])  Acute CVA with right thalamic infarct -continues to have left sided UE weakness -wasn't a tPA candidate at the time of the admission as he was on therapeutic coumadin.  -Initial CT head negative -MRI/MRA brain- shows 81mm right thalamic infarct  -Echocardiogram and carotid duplex - both ordered  -Neurology following -Risk factor stratification: A1c results pending;  LDL is 99 - should increase his statin dose or change it to lipitor  -PT/OT/SLP evaluation in progress  -Frequent neurological checks -Coumadin for anticoagulation; defer to neurology as to whether antiplatelet such as aspirin indicated    History of lower GI bleeding Dec 2017 -No further recurrence of symptoms since discharge -Hemoglobin stable     Diabetes mellitus type 2, diet-controlled -Current CBG control -Follow-up on hemoglobin A1c    Chronic anticoagulation 2/2 history of CVA -Warfarin per pharmacy -Current INR therapeutic     Leukocytosis -No fever and no infectious symptoms reported -Urinalysis unremarkable -Likely reactive in nature    HLD (hyperlipidemia) -Continue Zocor    Cough variant asthma -Continue Flonase and Dulera    DVT prophylaxis: on Coumadin  Code Status: DNR Family Communication:  Wife at bedside  Disposition Plan: Cont Tele monitor while stroke w/u in progress   Consultants:   Neuro   Procedures:   None   Antimicrobials:   None    Subjective: No complaints this morning. Still continues to  have left sided arm weakness.   Objective: Vitals:   02/23/16 0004 02/23/16 0207 02/23/16 0400 02/23/16 0900  BP: 122/79 (!) 147/78 127/66 (!) 146/83  Pulse: 79 70 67 90  Resp: 18 18 18 18   Temp: 98.2 F (36.8 C) 98 F (36.7 C) 98 F (36.7  C)   TempSrc: Oral Oral Oral Oral  SpO2: 97% 95% 96% 96%  Weight:      Height:        Intake/Output Summary (Last 24 hours) at 02/23/16 1244 Last data filed at 02/23/16 0210  Gross per 24 hour  Intake                0 ml  Output             1275 ml  Net            -1275 ml   Filed Weights   02/22/16 1319  Weight: 76.7 kg (169 lb 1.5 oz)    Examination:  General exam: Appears calm and comfortable  Respiratory system: Clear to auscultation. Respiratory effort normal. Cardiovascular system: S1 & S2 heard, RRR. No JVD, murmurs, rubs, gallops or clicks. No pedal edema. Gastrointestinal system: Abdomen is nondistended, soft and nontender. No organomegaly or masses felt. Normal bowel sounds heard. Central nervous system: Alert and oriented. RUE 5/5 in strenght, LUE 3+/5 in strength, 4+/5 for B/L Le, CN II-Xii intact.  Extremities: Symmetric 5 x 5 power. Skin: No rashes, lesions or ulcers Psychiatry: Judgement and insight appear normal. Mood & affect appropriate.     Data Reviewed:   CBC:  Recent Labs Lab 02/22/16 1306 02/22/16 1309  WBC 13.2*  --   NEUTROABS 6.3  --   HGB 14.1 15.0  HCT 42.5 44.0  MCV 94.0  --   PLT 373  --    Basic Metabolic Panel:  Recent Labs Lab 02/22/16 1306 02/22/16 1309  NA 137 138  K 4.0 4.0  CL 104 103  CO2 23  --   GLUCOSE 88 87  BUN 14 15  CREATININE 0.71 0.70  CALCIUM 8.4*  --    GFR: Estimated Creatinine Clearance: 69.5 mL/min (by C-G formula based on SCr of 0.7 mg/dL). Liver Function Tests:  Recent Labs Lab 02/22/16 1306  AST 20  ALT 14*  ALKPHOS 67  BILITOT 0.5  PROT 6.6  ALBUMIN 3.5   No results for input(s): LIPASE, AMYLASE in the last 168 hours. No results for input(s): AMMONIA in the last 168 hours. Coagulation Profile:  Recent Labs Lab 02/22/16 1306 02/23/16 0651  INR 2.03 2.03   Cardiac Enzymes: No results for input(s): CKTOTAL, CKMB, CKMBINDEX, TROPONINI in the last 168 hours. BNP (last 3  results) No results for input(s): PROBNP in the last 8760 hours. HbA1C: No results for input(s): HGBA1C in the last 72 hours. CBG:  Recent Labs Lab 02/22/16 1304  GLUCAP 84   Lipid Profile:  Recent Labs  02/23/16 0651  CHOL 179  HDL 59  LDLCALC 99  TRIG 103  CHOLHDL 3.0   Thyroid Function Tests: No results for input(s): TSH, T4TOTAL, FREET4, T3FREE, THYROIDAB in the last 72 hours. Anemia Panel: No results for input(s): VITAMINB12, FOLATE, FERRITIN, TIBC, IRON, RETICCTPCT in the last 72 hours. Sepsis Labs: No results for input(s): PROCALCITON, LATICACIDVEN in the last 168 hours.  No results found for this or any previous visit (from the past 240 hour(s)).       Radiology Studies: [facility] Contrast  Result Date: 02/22/2016 CLINICAL DATA:  Stroke symptoms.  Left-sided weakness slurred speech EXAM: MRI HEAD WITHOUT CONTRAST MRA HEAD WITHOUT CONTRAST TECHNIQUE: Multiplanar, multiecho pulse sequences of the brain and surrounding structures were obtained without intravenous contrast. Angiographic images of the head were obtained using MRA technique without contrast. COMPARISON:  CT head 02/22/2016  . FINDINGS: MRI HEAD FINDINGS Brain: Small focus of restricted diffusion in the posterior limb internal capsule on the right involving the lateral thalamus. This measures approximately 7 mm in diameter and is consistent with acute infarct. No other acute infarct identified. Moderate atrophy. Ventricular enlargement consistent with atrophy. Chronic microvascular ischemic change in the white matter bilaterally. Chronic infarct right external capsule posteriorly. Pituitary not enlarged. Negative for intracranial hemorrhage or mass. No fluid collection. Vascular: Normal arterial flow voids. Skull and upper cervical spine: C1-2 arthropathy with prominent pannus behind the dens causing mild spinal stenosis. No bony lesion identified. Sinuses/Orbits: Extensive sinusitis with diffuse mucosal  thickening throughout the paranasal sinuses. Air-fluid level left maxillary sinus. Extensive mastoid effusion bilaterally. Bilateral lens replacement. No orbital lesion. Other: None MRA HEAD FINDINGS Internal carotid artery widely patent bilaterally. Anterior and middle cerebral arteries patent bilaterally without significant stenosis. Both vertebral arteries patent to the basilar. Left PICA patent. Right PICA not visualized. Basilar widely patent. Superior cerebellar and posterior cerebral arteries patent bilaterally. Negative for aneurysm. IMPRESSION: 7 mm acute infarct right posterior limb internal capsule and right lateral thalamus. Atrophy with chronic microvascular ischemic change. Negative MRA head Extensive sinusitis with air-fluid level left maxillary sinus. Electronically Signed   By: Franchot Gallo M.D.   On: 02/22/2016 17:01   Mr Brain Wo Contrast  Result Date: 02/22/2016 CLINICAL DATA:  Stroke symptoms.  Left-sided weakness slurred speech EXAM: MRI HEAD WITHOUT CONTRAST MRA HEAD WITHOUT CONTRAST TECHNIQUE: Multiplanar, multiecho pulse sequences of the brain and surrounding structures were obtained without intravenous contrast. Angiographic images of the head were obtained using MRA technique without contrast. COMPARISON:  CT head 02/22/2016  . FINDINGS: MRI HEAD FINDINGS Brain: Small focus of restricted diffusion in the posterior limb internal capsule on the right involving the lateral thalamus. This measures approximately 7 mm in diameter and is consistent with acute infarct. No other acute infarct identified. Moderate atrophy. Ventricular enlargement consistent with atrophy. Chronic microvascular ischemic change in the white matter bilaterally. Chronic infarct right external capsule posteriorly. Pituitary not enlarged. Negative for intracranial hemorrhage or mass. No fluid collection. Vascular: Normal arterial flow voids. Skull and upper cervical spine: C1-2 arthropathy with prominent pannus behind  the dens causing mild spinal stenosis. No bony lesion identified. Sinuses/Orbits: Extensive sinusitis with diffuse mucosal thickening throughout the paranasal sinuses. Air-fluid level left maxillary sinus. Extensive mastoid effusion bilaterally. Bilateral lens replacement. No orbital lesion. Other: None MRA HEAD FINDINGS Internal carotid artery widely patent bilaterally. Anterior and middle cerebral arteries patent bilaterally without significant stenosis. Both vertebral arteries patent to the basilar. Left PICA patent. Right PICA not visualized. Basilar widely patent. Superior cerebellar and posterior cerebral arteries patent bilaterally. Negative for aneurysm. IMPRESSION: 7 mm acute infarct right posterior limb internal capsule and right lateral thalamus. Atrophy with chronic microvascular ischemic change. Negative MRA head Extensive sinusitis with air-fluid level left maxillary sinus. Electronically Signed   By: Franchot Gallo M.D.   On: 02/22/2016 17:01   Ct Head Code Stroke W/o Cm  Result Date: 02/22/2016 CLINICAL DATA:  Code stroke. Left-sided weakness. Decreased coordination with tingling. EXAM: CT HEAD WITHOUT CONTRAST TECHNIQUE: Contiguous axial images were obtained from  the base of the skull through the vertex without intravenous contrast. COMPARISON:  07/25/2015 FINDINGS: Brain: No evidence of acute infarction, hemorrhage, hydrocephalus, extra-axial collection or mass lesion/mass effect. Chronic microvascular disease with ischemic gliosis throughout the cerebral white matter. When compared to prior there are remote lacunar infarcts in the anterior limb right internal capsule and posterior right putamen. Vascular: Atherosclerotic calcification.  No hyperdense vessel. Skull: No acute or aggressive finding Sinuses/Orbits: Advanced sinusitis with diffuse mucosal thickening, essentially complete in bilateral ethmoid and frontal sinuses. Bilateral mastoid opacification, left more than right. Other: Text page  was sent at the time of interpretation on 02/22/2016 at 1:17 pm to Dr. Roland Rack , who subsequently verbally acknowledged these results. ASPECTS [facility] Stroke Program Early CT Score) - Ganglionic level infarction (caudate, lentiform nuclei, internal capsule, insula, M1-M3 cortex): 7 when accounting for chronic changes. - Supraganglionic infarction (M4-M6 cortex): 3 Total score (0-10 with 10 being normal): 10 IMPRESSION: 1. No acute finding. 2. Chronic microvascular disease, moderate. 3. Advanced chronic sinusitis. 4. Left more than right mastoid opacification which has developed since June 2017. Electronically Signed   By: Monte Fantasia M.D.   On: 02/22/2016 13:33        Scheduled Meds: . fluticasone  2 spray Each Nare Daily  . mometasone-formoterol  2 puff Inhalation BID  . simvastatin  20 mg Oral Daily  . warfarin  5 mg Oral ONCE-1800  . Warfarin - Pharmacist Dosing Inpatient   Does not apply q1800   Continuous Infusions:   LOS: 0 days    Time spent: 35 mins     Olamide Lahaie Arsenio Loader, MD [facility] Pager [phone]   If 7PM-7AM, please contact night-coverage [email or web address] Password [email or web address] 02/23/2016, 12:44 PM

## 2016-02-23 NOTE — Progress Notes (Signed)
STROKE TEAM PROGRESS NOTE   HISTORY OF PRESENT ILLNESS (per record) [name] is a 83 y.o. male who presents with new onset left-sided weakness and ataxia that started at 11:15 AM. He was completely normal prior to this. It is not static deficit, no progression or improvement.  LKW: 11:15 AM tpa given?: no, INR 2   SUBJECTIVE (INTERVAL HISTORY) He is feeling ok, left hemiplegia, wife is at bedside. He had a GI bleed earlier this month and was given vit K, his INR has not been therapeutic all month.   OBJECTIVE Temp:  [97.7 F (36.5 C)-98.2 F (36.8 C)] 98 F (36.7 C) (01/07 0400) Pulse Rate:  [66-91] 67 (01/07 0400) Cardiac Rhythm: Heart block (01/07 0815) Resp:  [14-23] 18 (01/07 0400) BP: (122-162)/(61-105) 127/66 (01/07 0400) SpO2:  [82 %-98 %] 96 % (01/07 0400) Weight:  [76.7 kg (169 lb 1.5 oz)] 76.7 kg (169 lb 1.5 oz) (01/06 1319)  CBC:   Recent Labs Lab 02/22/16 1306 02/22/16 1309  WBC 13.2*  --   NEUTROABS 6.3  --   HGB 14.1 15.0  HCT 42.5 44.0  MCV 94.0  --   PLT 373  --     Basic Metabolic Panel:   Recent Labs Lab 02/22/16 1306 02/22/16 1309  NA 137 138  K 4.0 4.0  CL 104 103  CO2 23  --   GLUCOSE 88 87  BUN 14 15  CREATININE 0.71 0.70  CALCIUM 8.4*  --     Lipid Panel:     Component Value Date/Time   CHOL 179 02/23/2016 0651   TRIG 103 02/23/2016 0651   HDL 59 02/23/2016 0651   CHOLHDL 3.0 02/23/2016 0651   VLDL 21 02/23/2016 0651   LDLCALC 99 02/23/2016 0651   HgbA1c:  Lab Results  Component Value Date   HGBA1C 6.5 (H) 07/25/2015   Urine Drug Screen:     Component Value Date/Time   LABOPIA NONE DETECTED 02/22/2016 1340   COCAINSCRNUR NONE DETECTED 02/22/2016 1340   LABBENZ NONE DETECTED 02/22/2016 1340   AMPHETMU NONE DETECTED 02/22/2016 1340   THCU NONE DETECTED 02/22/2016 1340   LABBARB NONE DETECTED 02/22/2016 1340      IMAGING  Mr Mra Head Wo Contrast 02/22/2016 7 mm acute infarct right posterior limb internal  capsule and right lateral thalamus. Atrophy with chronic microvascular ischemic change.  Negative MRA head  Extensive sinusitis with air-fluid level left maxillary sinus.     Ct Head Code Stroke W/o Cm 02/22/2016 1. No acute finding.  2. Chronic microvascular disease, moderate.  3. Advanced chronic sinusitis.  4. Left more than right mastoid opacification which has developed since June 2017.      PHYSICAL EXAM  Exam: Gen: NAD Eyes: anicteric sclerae, moist conjunctivae                    CV: no MRG, no carotid bruits, no peripheral edema Mental Status: Alert, follows commands, good historian  Neuro: Detailed Neurologic Exam  Speech:    No aphasia, no dysarthria  Cranial Nerves:    The pupils are equal, round, and reactive to light.. Attempted, Fundi not visualized.  EOMI. No gaze preference. Visual fields full. Face symmetric, Tongue midline. Hearing intact to voice. Shoulder shrug intact  Motor Observation:    no involuntary movements noted. Tone appears normal.     Strength:   Left arm 2/5, left leg 3-/5.      Sensation:  Intact to LT  Plantars downgoing.     ASSESSMENT/PLAN [name] is a 83 y.o. male with history of previous stroke, hyperlipidemia, history of GI bleed, diabetes mellitus, atrial fibrillation on Coumadin therapy and asthma presenting with new onset left-sided weakness and ataxia . He did not receive IV t-PA due to anticoagulation.  Stroke:  Non-dominant infarct most likely embolic secondary to atrial fibrillation with subtherapeutic INR  Resultant  Left hemiplegia  MRI - 7 mm acute infarct right posterior limb internal capsule and right lateral thalamus.  MRA - negative   Carotid Doppler - pending  2D Echo - pending  Lower extremity venous duplex - pending  LDL - 99  HgbA1c - pending  VTE prophylaxis - warfarin  Diet regular Room service appropriate? Yes; Fluid consistency: Thin  warfarin daily prior to admission, now on  warfarin daily  Patient counseled to be compliant with his antithrombotic medications  Ongoing aggressive stroke risk factor management  Therapy recommendations: SNF recommended by OT , May consider a Rehab referral for inpatient rehab evaluation  Disposition: Pending  Hypertension  Stable  Permissive hypertension (OK if < 220/120) but gradually normalize in 5-7 days  Long-term BP goal normotensive  Hyperlipidemia  Home meds:  Zocor 20 mg daily resumed in hospital  LDL 99, goal < 70  Increase Zocor to 40 mg daily   Continue statin at discharge  Diabetes  HgbA1c pending, goal < 7.0  Controlled  Other Stroke Risk Factors  Advanced age  Overweight, Body mass index is 27.29 kg/m., recommend weight loss, diet and exercise as appropriate   Hx stroke/TIA  Atrial fibrillation  Other Active Problems  Mild leukocytosis - 13.2 - afebrile  Hospital day # 0   Personally examined patient and images, and have participated in and made any corrections needed to history, physical, neuro exam,assessment and plan as stated above.  I have personally obtained the history, evaluated lab date, reviewed imaging studies and agree with radiology interpretations. Patient presented with new onset left-sided weakness and ataxia, 7 mm acute infarct right posterior limb internal most likely embolic secondary to atrial fibrillation with subtherapeutic INR earlier this month.   Sarina Ill, MD Stroke Neurology     To contact Stroke Continuity provider, please refer to [email or web address] After hours, contact General Neurology

## 2016-02-23 NOTE — Evaluation (Signed)
Physical Therapy Evaluation Patient Details Name: [name] MRN: [id number] DOB: May 24, 1931 Today's Date: 02/23/2016   History of Present Illness  [name] is an 83 y.o. male who presented to the ED with new onset L sided weakness and ataxia. MRI revealed 89mm acute infart R posterior limb internal capsule and R lateral thalamus. Pt has a PMH significant for acute CVA 07/2015, astgna, cerebral infarctio ndue to embolism of L middle cerebral artery 01/2014, colitis, colon polyps, diabetes mellitus type 2, diverticulosis, GERD, hemerrhoids, hiatal hernia, history of esophageal stricture, hyperlipidemia, osteoarthritis, and proctitis.   Clinical Impression  Pt admitted with above diagnosis. Pt currently with functional limitations due to the deficits listed below (see PT Problem List). On eval, pt required mod assist for bed mobility and transfers. Pt is very motivated to participate in therapy. Pt will benefit from skilled PT to increase their independence and safety with mobility to allow discharge to the venue listed below.  Pt prefers to [facility] if they have a bed available. He is an excellent rehab candidate and encouraged to consider CIR instead or as an alternative if no bed available at [facility]     Follow Up Recommendations SNF    Equipment Recommendations  Other (comment) (TBD)    Recommendations for Other Services       Precautions / Restrictions Precautions Precautions: Fall      Mobility  Bed Mobility Overal bed mobility: Needs Assistance Bed Mobility: Supine to Sit     Supine to sit: Mod assist     General bed mobility comments: assist with LLE and to elevate trunk   Transfers Overall transfer level: Needs assistance Equipment used: Rolling walker (2 wheeled) Transfers: Sit to/from Stand Sit to Stand: Mod assist         General transfer comment: assist to power up and placement of L hand on  RW  Ambulation/Gait             General Gait Details: pre-gait activities performed in stance with RW: attaining midline and lateral weight shifts; mod assist required to maintain upright stance; pt with need +2 assist to progress ambulation   Stairs            Wheelchair Mobility    Modified Rankin (Stroke Patients Only) Modified Rankin (Stroke Patients Only) Pre-Morbid Rankin Score: No symptoms Modified Rankin: Moderately severe disability     Balance Overall balance assessment: Needs assistance Sitting-balance support: Feet supported;Single extremity supported Sitting balance-Leahy Scale: Fair Sitting balance - Comments: posterior lean Postural control: Posterior lean;Left lateral lean Standing balance support: Bilateral upper extremity supported;During functional activity Standing balance-Leahy Scale: Zero                               Pertinent Vitals/Pain Pain Assessment: No/denies pain    Home Living Family/patient expects to be discharged to:: Private residence (Independent living cottage at [facility]) Living Arrangements: Spouse/significant other Available Help at Discharge: Family;Available 24 hours/day Type of Home: Independent living facility Home Access: Level entry     Home Layout: One level Home Equipment: Bedside commode;Grab bars - tub/shower;Hand held shower head Additional Comments: lives at [facility]    Prior Function Level of Independence: Independent         Comments: Plays golf, corn hole, chair volleyball, chair yoga; drives     Hand Dominance   Dominant Hand: Right  Extremity/Trunk Assessment   Upper Extremity Assessment Upper Extremity Assessment: Defer to OT evaluation    Lower Extremity Assessment Lower Extremity Assessment: LLE deficits/detail LLE Deficits / Details: grossly 3-/5 hip and knee, 2/5 ankle LLE Sensation: decreased proprioception LLE Coordination: decreased gross motor;decreased  fine motor    Cervical / Trunk Assessment Cervical / Trunk Assessment: Kyphotic  Communication   Communication: Expressive difficulties  Cognition Arousal/Alertness: Awake/alert Behavior During Therapy: WFL for tasks assessed/performed Overall Cognitive Status: Within Functional Limits for tasks assessed                      General Comments      Exercises     Assessment/Plan    PT Assessment Patient needs continued PT services  PT Problem List Decreased strength;Decreased activity tolerance;Decreased balance;Decreased mobility;Decreased coordination;Decreased knowledge of use of DME;Impaired sensation;Decreased knowledge of precautions          PT Treatment Interventions DME instruction;Gait training;Functional mobility training;Neuromuscular re-education;Balance training;Therapeutic exercise;Patient/family education;Therapeutic activities    PT Goals (Current goals can be found in the Care Plan section)  Acute Rehab PT Goals Patient Stated Goal: to get rehab so he can get back to golfing PT Goal Formulation: With patient/family Time For Goal Achievement: 03/01/16 Potential to Achieve Goals: Good    Frequency Min 4X/week   Barriers to discharge        Co-evaluation               End of Session Equipment Utilized During Treatment: Gait belt Activity Tolerance: Patient tolerated treatment well Patient left: in bed;with bed alarm set;with call bell/phone within reach;with family/visitor present Nurse Communication: Mobility status         Time: [id number] PT Time Calculation (min) (ACUTE ONLY): 42 min   Charges:   PT Evaluation $PT Eval Moderate Complexity: 1 Procedure PT Treatments $Therapeutic Activity: 23-37 mins   PT G Codes:        Lorriane Shire 02/23/2016, 3:17 PM

## 2016-02-23 NOTE — Evaluation (Addendum)
Occupational Therapy Evaluation Patient Details Name: [name] MRN: [id number] DOB: 1931-04-06 Today's Date: 02/23/2016    History of Present Illness [name] is an 83 y.o. male who presented to the ED with new onset L sided weakness and ataxia. MRI revealed 53mm acute infart R posterior limb internal capsule and R lateral thalamus. Pt has a PMH significant for acute CVA 07/2015, astgna, cerebral infarctio ndue to embolism of L middle cerebral artery 01/2014, colitis, colon polyps, diabetes mellitus type 2, diverticulosis, GERD, hemerrhoids, hiatal hernia, history of esophageal stricture, hyperlipidemia, osteoarthritis, and proctitis.    Clinical Impression   PTA, pt was independent with all ADL and IADL. Pt lives with wife in an independent living cottage. He leads an active lifestyle, golfing and participating in modified exercise programs at the facility. Pt currently demonstrates decreased functional use of L UE due to weakness, decreased sensation, and decreased coordination. He requires min assist with UB ADL and max assist with LB ADL and toilet transfers. Pt is very motivated to participate with therapy. Pt and wife report that they would prefer pt to receive rehabilitation at the rehab center on the campus of their facility if at all possible. Recommend short-term SNF placement for continued rehabilitation to improve independence with ADL and functional mobility and to ensure safe return to PLOF. OT will continue to follow acutely.     Follow Up Recommendations  SNF;Supervision/Assistance - 24 hour    Equipment Recommendations  None recommended by OT       Precautions / Restrictions Precautions Precautions: Fall Restrictions Weight Bearing Restrictions: No      Mobility Bed Mobility Overal bed mobility: Needs Assistance Bed Mobility: Supine to Sit     Supine to sit: Mod assist     General bed mobility comments: Mod assist for LLE (as pt unable to lift out of bed) and  trunk.   Transfers Overall transfer level: Needs assistance Equipment used: Rolling walker (2 wheeled) Transfers: Sit to/from Stand Sit to Stand: Mod assist              Balance Overall balance assessment: Needs assistance Sitting-balance support: Single extremity supported;Feet supported Sitting balance-Leahy Scale: Fair     Standing balance support: Single extremity supported;Bilateral upper extremity supported;During functional activity Standing balance-Leahy Scale: Poor                              ADL Overall ADL's : Needs assistance/impaired Eating/Feeding: Supervision/ safety;Set up;Sitting   Grooming: Minimal assistance;Sitting   Upper Body Bathing: Minimal assistance;Sitting   Lower Body Bathing: Maximal assistance;Sit to/from stand   Upper Body Dressing : Minimal assistance;Sitting   Lower Body Dressing: Maximal assistance;Sit to/from stand   Toilet Transfer: BSC;Maximal assistance;Ambulation;RW Toilet Transfer Details (indicate cue type and reason): Took a few steps with transfer with heavy L lateral lean on therapist. Only able to weight shift minimally for steps. Toileting- Clothing Manipulation and Hygiene: Maximal assistance;Sit to/from stand         General ADL Comments: Pt educated on compensatory dressing techniques and positioning of L UE. Pt and wife eager to learn strategies and motivated for rehabilitation. Pt able to complete PROM moving L UE with R and encouraged to continue this a few times per day.     Vision Vision Assessment?: Yes;Vision impaired- to be further tested in functional context Eye Alignment: Within Functional Limits Ocular Range of Motion: Within Functional Limits Alignment/Gaze Preference: Within Defined Limits  Tracking/Visual Pursuits: Decreased smoothness of eye movement to RIGHT superior field;Decreased smoothness of eye movement to RIGHT inferior field Saccades: Additional eye shifts occurred during  testing;Additional head turns occurred during testing Visual Fields: Impaired-to be further tested in functional context Additional Comments: Pt reports blurring of vision worse than at baseline   Perception     Praxis      Pertinent Vitals/Pain Pain Assessment: No/denies pain     Hand Dominance Right   Extremity/Trunk Assessment Upper Extremity Assessment Upper Extremity Assessment: LUE deficits/detail LUE Deficits / Details: Brunstrom III hand, II arm LUE Sensation: decreased light touch;decreased proprioception LUE Coordination: decreased fine motor;decreased gross motor   Lower Extremity Assessment Lower Extremity Assessment: Defer to PT evaluation       Communication Communication Communication: Expressive difficulties (Slightly slurred speech)   Cognition Arousal/Alertness: Awake/alert Behavior During Therapy: WFL for tasks assessed/performed Overall Cognitive Status: Within Functional Limits for tasks assessed                 General Comments: Cognition in tact for tasks assessed, will continue to monitor   General Comments       Exercises       Shoulder Instructions      Home Living Family/patient expects to be discharged to:: Assisted living ([facility]; independent living cottage) Living Arrangements: Spouse/significant other Available Help at Discharge: Family;Available 24 hours/day Type of Home: House Home Access: Level entry     Home Layout: One level     Bathroom Shower/Tub: Occupational psychologist: Handicapped height     Home Equipment: Bedside commode;Grab bars - tub/shower;Hand held shower head          Prior Functioning/Environment Level of Independence: Independent        Comments: Plays golf, corn hole, chair volleyball, chair yoga; drives        OT Problem List: Decreased strength;Decreased range of motion;Decreased activity tolerance;Impaired balance (sitting and/or standing);Decreased  safety awareness;Decreased knowledge of use of DME or AE;Decreased knowledge of precautions;Pain   OT Treatment/Interventions: Self-care/ADL training;Therapeutic exercise;DME and/or AE instruction;Therapeutic activities;Visual/perceptual remediation/compensation;Patient/family education;Balance training    OT Goals(Current goals can be found in the care plan section) Acute Rehab OT Goals Patient Stated Goal: to get rehab so he can get back to golfing OT Goal Formulation: With patient/family Time For Goal Achievement: 03/08/16 Potential to Achieve Goals: Good ADL Goals Pt Will Perform Upper Body Dressing: with modified independence;sitting (with compensatory strategies) Pt Will Perform Lower Body Dressing: with supervision;sit to/from stand Pt Will Transfer to Toilet: with supervision;ambulating;bedside commode Pt Will Perform Toileting - Clothing Manipulation and hygiene: with supervision;sit to/from stand Pt/caregiver will Perform Home Exercise Program: Left upper extremity;With Supervision Additional ADL Goal #1: Pt will complete bed mobility with modified independence in preparation for ADL. Additional ADL Goal #2: Pt will incorporate L UE into grooming tasks while standing at sink with min guard assist for safety.  OT Frequency: Min 2X/week   Barriers to D/C:            Co-evaluation              End of Session Equipment Utilized During Treatment: Gait belt;Rolling walker  Activity Tolerance: Patient tolerated treatment well Patient left: in chair;with call bell/phone within reach;with chair alarm set;with family/visitor present   Time: [id number] OT Time Calculation (min): 34 min Charges:  OT General Charges $OT Visit: 1 Procedure OT Evaluation $OT Eval Moderate Complexity: 1 Procedure OT Treatments $Self Care/Home Management :  [place], OTR/L [phone] (weekend) [phone] (weekday) 02/23/2016, 9:19 AM

## 2016-02-23 NOTE — Progress Notes (Signed)
VASCULAR LAB PRELIMINARY  PRELIMINARY  PRELIMINARY  PRELIMINARY  Carotid duplex completed.    Preliminary report:  1-39% ICA plaquing. Vertebral artery flow is antegrade.   Londell Noll, RVT 02/23/2016, 4:18 PM

## 2016-02-24 ENCOUNTER — Inpatient Hospital Stay (HOSPITAL_COMMUNITY): Payer: Medicare HMO

## 2016-02-24 ENCOUNTER — Encounter (HOSPITAL_COMMUNITY): Payer: Self-pay | Admitting: Physical Medicine & Rehabilitation

## 2016-02-24 DIAGNOSIS — R791 Abnormal coagulation profile: Secondary | ICD-10-CM

## 2016-02-24 DIAGNOSIS — Z8673 Personal history of transient ischemic attack (TIA), and cerebral infarction without residual deficits: Secondary | ICD-10-CM

## 2016-02-24 DIAGNOSIS — I480 Paroxysmal atrial fibrillation: Secondary | ICD-10-CM

## 2016-02-24 DIAGNOSIS — R2990 Unspecified symptoms and signs involving the nervous system: Secondary | ICD-10-CM

## 2016-02-24 DIAGNOSIS — I10 Essential (primary) hypertension: Secondary | ICD-10-CM

## 2016-02-24 DIAGNOSIS — E785 Hyperlipidemia, unspecified: Secondary | ICD-10-CM

## 2016-02-24 DIAGNOSIS — Z8719 Personal history of other diseases of the digestive system: Secondary | ICD-10-CM

## 2016-02-24 DIAGNOSIS — I6789 Other cerebrovascular disease: Secondary | ICD-10-CM

## 2016-02-24 DIAGNOSIS — I639 Cerebral infarction, unspecified: Secondary | ICD-10-CM

## 2016-02-24 DIAGNOSIS — D72829 Elevated white blood cell count, unspecified: Secondary | ICD-10-CM

## 2016-02-24 DIAGNOSIS — E118 Type 2 diabetes mellitus with unspecified complications: Secondary | ICD-10-CM

## 2016-02-24 DIAGNOSIS — E119 Type 2 diabetes mellitus without complications: Secondary | ICD-10-CM

## 2016-02-24 DIAGNOSIS — Z7901 Long term (current) use of anticoagulants: Secondary | ICD-10-CM

## 2016-02-24 LAB — VAS US CAROTID
LCCAPDIAS: -22 cm/s
LEFT ECA DIAS: -15 cm/s
LEFT VERTEBRAL DIAS: 7 cm/s
LICAPSYS: -68 cm/s
Left CCA dist dias: 15 cm/s
Left CCA dist sys: 60 cm/s
Left CCA prox sys: -84 cm/s
Left ICA dist dias: -17 cm/s
Left ICA dist sys: -72 cm/s
Left ICA prox dias: -20 cm/s
RCCAPDIAS: 16 cm/s
RIGHT ECA DIAS: -18 cm/s
RIGHT VERTEBRAL DIAS: -5 cm/s
Right CCA prox sys: 62 cm/s
Right cca dist sys: -66 cm/s

## 2016-02-24 LAB — CBC
HCT: 49.3 % (ref 39.0–52.0)
Hemoglobin: 16.3 g/dL (ref 13.0–17.0)
MCH: 30.9 pg (ref 26.0–34.0)
MCHC: 33.1 g/dL (ref 30.0–36.0)
MCV: 93.4 fL (ref 78.0–100.0)
PLATELETS: 434 10*3/uL — AB (ref 150–400)
RBC: 5.28 MIL/uL (ref 4.22–5.81)
RDW: 14.9 % (ref 11.5–15.5)
WBC: 12.5 10*3/uL — ABNORMAL HIGH (ref 4.0–10.5)

## 2016-02-24 LAB — BASIC METABOLIC PANEL
Anion gap: 11 (ref 5–15)
BUN: 17 mg/dL (ref 6–20)
CALCIUM: 9.3 mg/dL (ref 8.9–10.3)
CO2: 24 mmol/L (ref 22–32)
CREATININE: 0.89 mg/dL (ref 0.61–1.24)
Chloride: 103 mmol/L (ref 101–111)
GFR calc non Af Amer: >60 mL/min (ref >60)
GLUCOSE: 165 mg/dL — AB (ref 65–99)
Potassium: 3.8 mmol/L (ref 3.5–5.1)
Sodium: 138 mmol/L (ref 135–145)

## 2016-02-24 LAB — PROTIME-INR
INR: 1.96
Prothrombin Time: 22.7 seconds — ABNORMAL HIGH (ref 11.4–15.2)

## 2016-02-24 LAB — ECHOCARDIOGRAM COMPLETE
Height: 66 in
Weight: 2705.49 oz

## 2016-02-24 LAB — HEMOGLOBIN A1C
Hgb A1c MFr Bld: 6.2 % — ABNORMAL HIGH (ref 4.8–5.6)
MEAN PLASMA GLUCOSE: 131 mg/dL

## 2016-02-24 MED ORDER — ASPIRIN EC 325 MG PO TBEC
325.0000 mg | DELAYED_RELEASE_TABLET | Freq: Every day | ORAL | Status: DC
  Administered 2016-02-25 – 2016-02-27 (×3): 325 mg via ORAL
  Filled 2016-02-24 (×3): qty 1

## 2016-02-24 MED ORDER — LORAZEPAM 0.5 MG PO TABS
0.5000 mg | ORAL_TABLET | Freq: Four times a day (QID) | ORAL | Status: DC | PRN
  Administered 2016-02-24 – 2016-02-25 (×4): 0.5 mg via ORAL
  Filled 2016-02-24 (×4): qty 1

## 2016-02-24 MED ORDER — WARFARIN SODIUM 5 MG PO TABS: 5.0000 mg | ORAL_TABLET | Freq: Once | ORAL | Status: DC

## 2016-02-24 NOTE — NC FL2 (Signed)
[place] LEVEL OF CARE SCREENING TOOL     IDENTIFICATION  Patient Name: [name] Birthdate: 1931-06-02 Sex: male Admission Date (Current Location): 02/22/2016  [facility] and [place] Number:  Herbalist and Address:  [facility], [place] [address], [place], [place] [address]      Provider Number: [id number]  Attending Physician Name and Address:  Ankit Arsenio Loader, MD  Relative Name and Phone Number:  Bron, [place]; [phone] (h),  [phone] (mobile)     Current Level of Care: Hospital Recommended Level of Care: [place] Prior Approval Number:    Date Approved/Denied:   PASRR Number: [id number] (Eff. 02/24/16)  Discharge Plan: SNF    Current Diagnoses: Patient Active Problem List   Diagnosis Date Noted  . Diabetes mellitus with complication ([place])   . Benign essential HTN   . PAF (paroxysmal atrial fibrillation) ([place])   . Subtherapeutic international normalized ratio (INR)   . Acute CVA (cerebrovascular accident) ([place]) 02/23/2016  . Stroke-like symptom 02/22/2016  . Leukocytosis 02/22/2016  . History of lower GI bleeding Dec 2017 02/01/2016  . Chronic anticoagulation 2/2 history of CVA 01/31/2016  . History of CVA (cerebrovascular accident) 01/31/2016  . Sinusitis, chronic 12/12/2015  . Cough variant asthma 10/31/2015  . Insomnia 07/25/2015  . HLD (hyperlipidemia) 02/03/2014  . Ulnar neuropathy at elbow of right upper extremity 02/03/2014  . Cervical radiculopathy 02/03/2014  . Seizures ([place])   . Diabetes mellitus type 2, diet-controlled ([place]) 12/22/2013  . Recurrent ventral hernia 12/15/2011  . History of esophageal strciture     Orientation RESPIRATION BLADDER Height & Weight     Self, Time, Situation, Place  Normal Continent Weight: 169 lb 1.5 oz (76.7 kg) Height:  5\' 6"  (167.6 cm)  BEHAVIORAL SYMPTOMS/MOOD NEUROLOGICAL BOWEL NUTRITION STATUS      Continent Diet (Regular)  AMBULATORY  STATUS COMMUNICATION OF NEEDS Skin   Extensive Assist Verbally Normal                       Personal Care Assistance Level of Assistance  Bathing, Feeding, Dressing Bathing Assistance: Maximum assistance Feeding assistance: Independent (Supervision for safety) Dressing Assistance: Maximum assistance     Functional Limitations Info  Sight, Hearing, Speech Sight Info: Adequate Hearing Info: Impaired Speech Info: Adequate    SPECIAL CARE FACTORS FREQUENCY  PT (By licensed PT), OT (By licensed OT)     PT Frequency: Evaluated 1/7 and a minimum of 4X per week therapy recommended  OT Frequency: Evaluated 1/7 and a minimum of 2X per week therapy recommended            Contractures Contractures Info: Not present    Additional Factors Info  Code Status, Allergies Code Status Info: DNR Allergies Info: No known allergies           Current Medications (02/24/2016):  This is the current hospital active medication list Current Facility-Administered Medications  Medication Dose Route Frequency Provider Last Rate Last Dose  . acetaminophen (TYLENOL) tablet 650 mg  650 mg Oral Q4H PRN Samella Parr, NP   650 mg at 02/22/16 2049   Or  . acetaminophen (TYLENOL) solution 650 mg  650 mg Per Tube Q4H PRN Samella Parr, NP       Or  . acetaminophen (TYLENOL) suppository 650 mg  650 mg Rectal Q4H PRN Samella Parr, NP      . fluticasone (FLONASE) 50 MCG/ACT nasal spray 2  spray  2 spray Each Nare Daily Samella Parr, NP   2 spray at 02/24/16 [id number]  . mometasone-formoterol (DULERA) 100-5 MCG/ACT inhaler 2 puff  2 puff Inhalation BID Samella Parr, NP   2 puff at 02/24/16 0945  . simvastatin (ZOCOR) tablet 40 mg  40 mg Oral Daily David L Rinehuls, PA-C      . warfarin (COUMADIN) tablet 5 mg  5 mg Oral ONCE-1800 Ankit Arsenio Loader, MD      . Warfarin - Pharmacist Dosing Inpatient   Does not apply q1800 Rolla Flatten, RPH      . zolpidem (AMBIEN) tablet 5 mg  5 mg Oral QHS PRN  Ankit Arsenio Loader, MD         Discharge Medications: Please see discharge summary for a list of discharge medications.  Relevant Imaging Results:  Relevant Lab Results:   Additional Information [id number]  [place] Mila Homer, LCSW

## 2016-02-24 NOTE — Progress Notes (Signed)
Occupational Therapy Treatment Patient Details Name: [name] MRN: [id number] DOB: 24-Apr-1931 Today's Date: 02/24/2016    History of present illness Mr. Tschirhart is an 83 y.o. male who presented to the ED with new onset L sided weakness and ataxia. MRI revealed 1mm acute infart R posterior limb internal capsule and R lateral thalamus. Pt has a PMH significant for acute CVA 07/2015, astgna, cerebral infarctio ndue to embolism of L middle cerebral artery 01/2014, colitis, colon polyps, diabetes mellitus type 2, diverticulosis, GERD, hemerrhoids, hiatal hernia, history of esophageal stricture, hyperlipidemia, osteoarthritis, and proctitis.    OT comments  Focus of session on seated grooming, UB dressing in unsupported sitting and neuro rehabilitation of L UE. Pt tolerating activity well and is a good candidate for intensive rehab in CIR. Pt with decreased safety and awareness of deficits, nearly having slid out of his chair upon OTs arrival. Will continue to follow.  Follow Up Recommendations  CIR;Supervision/Assistance - 24 hour    Equipment Recommendations  None recommended by OT    Recommendations for Other Services      Precautions / Restrictions Precautions Precautions: Fall Restrictions Weight Bearing Restrictions: No       Mobility Bed Mobility               General bed mobility comments: Pt sitting up in recliner upon OT arrival.   Transfers     Balance Overall balance assessment: Needs assistance Sitting-balance support: Feet supported;Single extremity supported Sitting balance-Leahy Scale: Fair Sitting balance - Comments: posterior lean Postural control: Posterior lean;Left lateral lean S                  ADL Overall ADL's : Needs assistance/impaired     Grooming: Wash/dry hands;Wash/dry face;Oral care;Sitting;Minimal assistance Grooming Details (indicate cue type and reason): set up of washcloth and toothbrush         Upper Body Dressing :  Moderate assistance;Sitting Upper Body Dressing Details (indicate cue type and reason): began education in hemi techniques for dressing                   General ADL Comments: Positioned L UE on pillow and educated pt and wife in importance of supporting UE to prevent injury..      Vision                     Perception     Praxis      Cognition   Behavior During Therapy: WFL for tasks assessed/performed Overall Cognitive Status: Impaired/Different from baseline Area of Impairment: Safety/judgement          Safety/Judgement: Decreased awareness of safety;Decreased awareness of deficits     General Comments: pt about to fall out of his chair upon OTs arrival, denying danger or need for assistance to reposition    Extremity/Trunk Assessment               Exercises     Shoulder Instructions       General Comments      Pertinent Vitals/ Pain       Pain Assessment: No/denies pain  Home Living                                          Prior Functioning/Environment              Frequency  Min 2X/week  Progress Toward Goals  OT Goals(current goals can now be found in the care plan section)  Progress towards OT goals: Progressing toward goals  Acute Rehab OT Goals Patient Stated Goal: to get rehab so he can get back to golfing Time For Goal Achievement: 03/08/16 Potential to Achieve Goals: Good  Plan Discharge plan needs to be updated    Co-evaluation                 End of Session     Activity Tolerance Patient tolerated treatment well   Patient Left in chair;with call bell/phone within reach;with chair alarm set;with family/visitor present   Nurse Communication          Time: [id number] OT Time Calculation (min): 17 min  Charges: OT General Charges $OT Visit: 1 Procedure OT Treatments $Self Care/Home Management : 8-22 mins  Malka So 02/24/2016, 2:39 PM  [phone]

## 2016-02-24 NOTE — Progress Notes (Signed)
Physical Therapy Treatment Patient Details Name: JEFFERIE MEYERING MRN: [id number] DOB: 1931/11/03 Today's Date: 02/24/2016    History of Present Illness Mr. Bodkins is an 83 y.o. male who presented to the ED with new onset L sided weakness and ataxia. MRI revealed 62mm acute infart R posterior limb internal capsule and R lateral thalamus. Pt has a PMH significant for acute CVA 07/2015, astgna, cerebral infarctio ndue to embolism of L middle cerebral artery 01/2014, colitis, colon polyps, diabetes mellitus type 2, diverticulosis, GERD, hemerrhoids, hiatal hernia, history of esophageal stricture, hyperlipidemia, osteoarthritis, and proctitis.     PT Comments    Pt progressing towards physical therapy goals. Discussed d/c plan with pt and encouraged him to reconsider CIR instead of SNF. He is an excellent rehab candidate and motivated to work with therapy. Feel he would thrive in the CIR environment. Pt agreeable to CIR consult after discussion. Will continue to follow and progress as able per POC.   Follow Up Recommendations  CIR;Supervision/Assistance - 24 hour     Equipment Recommendations  Other (comment) (TBD by next venue of care)    Recommendations for Other Services Rehab consult     Precautions / Restrictions Precautions Precautions: Fall Restrictions Weight Bearing Restrictions: No    Mobility  Bed Mobility               General bed mobility comments: Pt sitting up in recliner upon PT arrival.   Transfers Overall transfer level: Needs assistance Equipment used: Rolling walker (2 wheeled) Transfers: Sit to/from Stand Sit to Stand: Mod assist;+2 physical assistance         General transfer comment: assist to power up and placement of L hand on RW  Ambulation/Gait Ambulation/Gait assistance: Mod assist;+2 physical assistance;+2 safety/equipment Ambulation Distance (Feet): 15 Feet Assistive device: Rolling walker (2 wheeled) Gait Pattern/deviations: Step-to  pattern;Decreased stride length;Decreased weight shift to left;Decreased stance time - left;Decreased step length - left Gait velocity: Decreased Gait velocity interpretation: Below normal speed for age/gender General Gait Details: Pt ambulated in room with +2 assist and chair follow for safety. Pt with increased L lateral lean, and required assist for midline weight shift and L foot advancement.    Stairs            Wheelchair Mobility    Modified Rankin (Stroke Patients Only) Modified Rankin (Stroke Patients Only) Pre-Morbid Rankin Score: No symptoms Modified Rankin: Moderately severe disability     Balance Overall balance assessment: Needs assistance Sitting-balance support: Feet supported;Single extremity supported Sitting balance-Leahy Scale: Fair Sitting balance - Comments: posterior lean Postural control: Posterior lean;Left lateral lean Standing balance support: Bilateral upper extremity supported;During functional activity Standing balance-Leahy Scale: Zero                      Cognition Arousal/Alertness: Awake/alert Behavior During Therapy: WFL for tasks assessed/performed Overall Cognitive Status: Within Functional Limits for tasks assessed                      Exercises      General Comments General comments (skin integrity, edema, etc.): Stoof edge of chair at end of session in front of mirror and was able to improve posture to upright/midline and maintain for a short bout. Assist required to keep L hand on walker as he fatigued.      Pertinent Vitals/Pain Pain Assessment: No/denies pain    Home Living  Prior Function            PT Goals (current goals can now be found in the care plan section) Acute Rehab PT Goals Patient Stated Goal: to get rehab so he can get back to golfing PT Goal Formulation: With patient Time For Goal Achievement: 03/01/16 Potential to Achieve Goals: Good Progress towards PT  goals: Progressing toward goals    Frequency    Min 4X/week      PT Plan Discharge plan needs to be updated    Co-evaluation             End of Session Equipment Utilized During Treatment: Gait belt Activity Tolerance: Patient tolerated treatment well Patient left: with call bell/phone within reach (On [facility] - RN aware)     Time: [id number] PT Time Calculation (min) (ACUTE ONLY): 24 min  Charges:  $Gait Training: 8-22 mins $Therapeutic Activity: 8-22 mins                    G Codes:      Thelma Comp Feb 29, 2016, 2:09 PM   Rolinda Roan, PT, DPT Acute Rehabilitation Services Pager: [phone]

## 2016-02-24 NOTE — Progress Notes (Signed)
ANTICOAGULATION CONSULT NOTE - Follow Up Consult  Pharmacy Consult for Warfarin Indication: CVA  Patient Measurements: Height: 5\' 6"  (167.6 cm) Weight: 169 lb 1.5 oz (76.7 kg) IBW/kg (Calculated) : 63.8  Labs:  Recent Labs  02/22/16 1306 02/22/16 1309 02/23/16 0651 02/24/16 0857  HGB 14.1 15.0  --  16.3  HCT 42.5 44.0  --  49.3  PLT 373  --   --  434*  APTT 36  --   --   --   LABPROT 23.3*  --  23.2* 22.7*  INR 2.03  --  2.03 1.96  CREATININE 0.71 0.70  --  0.89    Estimated Creatinine Clearance: 62.5 mL/min (by C-G formula based on SCr of 0.89 mg/dL).   Assessment: 21 YOM admitted with stroke-like symptoms. History of CVA on warfarin PTA. Also with recent admission for GIB in 01/2016. Not a candidate for TPA. INR therapeutic 2.03 on admit.   INR now subtherapeutic at 1.96 (goal of 2-3), CBC stable. No overt s/sx of bleeding noted. Given 5 mg dose 1/7 instead of normal 2.5 mg Sunday dose.  Goal of Therapy:  INR 2-3   Plan:  Continue warfarin 5 mg today Monitor daily INR, CBC, clinical course, s/sx of bleed, PO intake, DDI   Thank you for allowing [place] to participate in this patients care. Jens Som, PharmD Clinical phone for 02/24/2016 from 7a-3:30p: [phone] If after 3:30p, please call main pharmacy at: [phone] 02/24/2016 10:14 AM

## 2016-02-24 NOTE — Progress Notes (Signed)
  Echocardiogram 2D Echocardiogram has been performed.  [name] 02/24/2016, 3:49 PM

## 2016-02-24 NOTE — Care Management Note (Signed)
Case Management Note  Patient Details  Name: [name] MRN: [id number] Date of Birth: 01-03-1932  Subjective/Objective:   Pt admitted with CVA. He is from [place] with his wife. She does not feel she can manage him at home at his currently level of functioning.                  Action/Plan: PT/OT recommending SNF. Orders also placed for CIR. The patient and his wife prefer CIR but also are interested in rehab at [facility] if they have a bed available. CM informed CSW. CM following.  Expected Discharge Date:                  Expected Discharge Plan:  Skilled Nursing Facility  In-House Referral:  Clinical Social Work  Discharge planning Services  CM Consult  Post Acute Care Choice:    Choice offered to:     DME Arranged:    DME Agency:     HH Arranged:    [place] Agency:     Status of Service:  In process, will continue to follow  If discussed at Long Length of Stay Meetings, dates discussed:    Additional Comments:  Pollie Friar, RN 02/24/2016, 12:51 PM

## 2016-02-24 NOTE — Progress Notes (Signed)
STROKE TEAM PROGRESS NOTE   HISTORY OF PRESENT ILLNESS (per record) [name] is a 83 y.o. male who presents with new onset left-sided weakness and ataxia that started at 11:15 AM on 02/22/2016 ([facility]). He was completely normal prior to this. It is not static deficit, no progression or improvement. tPA was not given due to INR 2. He was admitted for further evaluation and treatment,    SUBJECTIVE (INTERVAL HISTORY) His wife is at the bedside. He remains in the RESPECT ESUS trial. He is on coumadin. He does NOT have a history of atrial fibrillation. Long discussion with pt and wife, risk and benefits of treatment choices - at this time, risk of coumadin seems to outweigh benefit (current stroke due to small vessel disease, not embolic as previously thought). Also discussed loop placement. 30d monitor neg in the past. Dr. Leonie Man recommends stopping coumadin unless atrial fibrillation confirmed. Risk of coumadin increases given recent GIB d/t diverticulitis.    OBJECTIVE Temp:  [97.5 F (36.4 C)-98.5 F (36.9 C)] 97.5 F (36.4 C) (01/08 0458) Pulse Rate:  [72-89] 77 (01/08 0458) Cardiac Rhythm: Normal sinus rhythm (01/08 0750) Resp:  [18] 18 (01/08 0458) BP: (136-153)/(61-84) 143/67 (01/08 0458) SpO2:  [94 %-98 %] 94 % (01/08 0945)  CBC:   Recent Labs Lab 02/22/16 1306 02/22/16 1309 02/24/16 0857  WBC 13.2*  --  12.5*  NEUTROABS 6.3  --   --   HGB 14.1 15.0 16.3  HCT 42.5 44.0 49.3  MCV 94.0  --  93.4  PLT 373  --  434*    Basic Metabolic Panel:   Recent Labs Lab 02/22/16 1306 02/22/16 1309 02/24/16 0857  NA 137 138 138  K 4.0 4.0 3.8  CL 104 103 103  CO2 23  --  24  GLUCOSE 88 87 165*  BUN 14 15 17   CREATININE 0.71 0.70 0.89  CALCIUM 8.4*  --  9.3    Lipid Panel:     Component Value Date/Time   CHOL 179 02/23/2016 0651   TRIG 103 02/23/2016 0651   HDL 59 02/23/2016 0651   CHOLHDL 3.0 02/23/2016 0651   VLDL 21 02/23/2016 0651   LDLCALC 99 02/23/2016 0651    HgbA1c:  Lab Results  Component Value Date   HGBA1C 6.2 (H) 02/23/2016   Urine Drug Screen:     Component Value Date/Time   LABOPIA NONE DETECTED 02/22/2016 1340   COCAINSCRNUR NONE DETECTED 02/22/2016 1340   LABBENZ NONE DETECTED 02/22/2016 1340   AMPHETMU NONE DETECTED 02/22/2016 1340   THCU NONE DETECTED 02/22/2016 1340   LABBARB NONE DETECTED 02/22/2016 1340      IMAGING  Ct Head Code Stroke W/o Cm 02/22/2016 1. No acute finding.  2. Chronic microvascular disease, moderate.  3. Advanced chronic sinusitis.  4. Left more than right mastoid opacification which has developed since June 2017.   [name] Head Wo Contrast 02/22/2016 7 mm acute infarct right posterior limb internal capsule and right lateral thalamus. Atrophy with chronic microvascular ischemic change.  Negative MRA head  Extensive sinusitis with air-fluid level left maxillary sinus.   Carotid Doppler   There is 1-39% bilateral ICA stenosis. Vertebral artery flow is antegrade.   LE Venous Doppler There is no DVT or SVT noted in the left lower extremity.  2D Echocardiogram  pending    PHYSICAL EXAM Gen: NAD Eyes: anicteric sclerae, moist conjunctivae  CV: no MRG, no carotid bruits, no peripheral edema Mental Status: Alert, follows commands, good historian Neuro: Detailed Neurologic Exam Speech:    No aphasia, no dysarthria Cranial Nerves:    The pupils are equal, round, and reactive to light.. Attempted, Fundi not visualized.  EOMI. No gaze preference. Visual fields full. Face symmetric, Tongue midline. Hearing intact to voice. Shoulder shrug intact Motor Observation:    no involuntary movements noted. Tone appears normal.  Strength:   Left arm 2/5, left leg 3-/5.  Sensation:  Intact to LT Plantars downgoing.    ASSESSMENT/PLAN [name] is a 83 y.o. male with history of previous stroke, hyperlipidemia, history of GI bleed, diabetes mellitus, and asthma presenting with  new onset left-sided weakness and ataxia (NO HX ATRIAL FIB). He did not receive IV t-PA due to anticoagulation.  Stroke:  Small R PLIC infarct secondary to small vessel disease while on therapeutic coumadin  Resultant  Left hemiplegia  MRI - 7 mm acute infarct right posterior limb internal capsule and right lateral thalamus.  MRA - negative   Carotid Doppler - No significant stenosis   2D Echo - pending  Lower extremity venous duplex - no VTE  LDL - 99  HgbA1c - 6.2  VTE prophylaxis - warfarin. Add lovenox once INR drops. Diet regular Room service appropriate? Yes; Fluid consistency: Thin  warfarin daily prior to admission, now on warfarin daily. Long discussion with pt/wife (see above). Dr. Leonie Man also discussed with Dr. Delfina Redwood. Recommend stopping coumadin. Continue aspirin for secondary stroke prevention. Orders written.  Patient counseled to be compliant with his antithrombotic medications  Ongoing aggressive stroke risk factor management  Therapy recommendations: SNF recommended by OT and PT. Will get Rehab referral for inpatient rehab evaluation.  Disposition: pending   Hypertension  Stable  Permissive hypertension (OK if < 220/120) but gradually normalize in 5-7 days  Long-term BP goal normotensive  Hyperlipidemia  Home meds:  Zocor 20 mg daily resumed in hospital  LDL 99, goal < 70  Increased Zocor to 40 mg daily   Continue statin at discharge  Diabetes  HgbA1c 6.2 goal < 7.0  Controlled  Other Stroke Risk Factors  Advanced age  Overweight, Body mass index is 27.29 kg/m., recommend weight loss, diet and exercise as appropriate   Hx stroke/TIA -   07/2015 L occipital infarct. Felt to be embolic. No atrial fibrillation found.  Placed on coumadin as part of RESPECT ESUS.   2015 x 3 episodes with Recurrent slurred speech   2010 Hx slurred speech  Other Active Problems  Mild leukocytosis - 13.2->12.5 - afebrile  Hospital day # [place] for Pager information 02/24/2016 11:37 AM  I have personally examined this patient, reviewed notes, independently viewed imaging studies, participated in medical decision making and plan of care.ROS completed by me personally and pertinent positives fully documented  I have made any additions or clarifications directly to the above note. Agree with note above. The patient has had multiple recurrent strokes despite being on Plavix, RESPECT ESUS trial medication and more recently warfarin. He has no clear documented atrial fibrillation or other long-term indication for antocoagulation and has had 2 episodes of lower GI hemorrhage hence I  recommend discontinuing warfarin and changing to aspirin 325 mg daily.I had a long discussion with Dr. Delfina Redwood as well as patient and his wife and uncertain questions. Greater than 50% time during this 35 minute visit was spent  on counseling and coordination of care about his recurrent strokes. Antony Contras, MD Medical Director [facility] Pager: [phone] 02/24/2016 10:58 PM   To contact Stroke Continuity provider, please refer to [email or web address] After hours, contact General Neurology

## 2016-02-24 NOTE — Progress Notes (Signed)
PROGRESS NOTE    [name]  [id number] DOB: 1931-02-18 DOA: 02/22/2016 PCP: Kandice Hams, MD     Assessment & Plan:   Principal Problem:   Stroke-like symptom Active Problems:   Diabetes mellitus type 2, diet-controlled (HCC)   HLD (hyperlipidemia)   Cough variant asthma   Chronic anticoagulation 2/2 history of CVA   History of CVA (cerebrovascular accident)   History of lower GI bleeding Dec 2017   Leukocytosis   Acute CVA (cerebrovascular accident) ([place])  Acute CVA with right thalamic infarct -continues to have left sided UE weakness -wasn't a tPA candidate at the time of the admission as he was on therapeutic coumadin.  -Initial CT head negative -MRI/MRA brain- shows 34mm right thalamic infarct  -Echocardiogram - ordered, Carotid duplex done- pending final read (prelim read seems to be 1-39% b/l stenosis) -Neurology following -Risk factor stratification: A1c results 6.2 - should control with diet and exercise.;  LDL is 99 -zocor increased to 40mg  po daily  -PT/OT/SLP evaluation in progress  -Frequent neurological checks -Coumadin for anticoagulation; defer to neurology as to whether antiplatelet such as aspirin indicated - social worker consulted to help facilitate placement.     History of lower GI bleeding Dec 2017 -No further recurrence of symptoms since discharge -Hemoglobin stable     Diabetes mellitus type 2, diet-controlled -Current CBG control -A1c: 6.2 at this time. Need to strictly control with diet and exercise.     Chronic anticoagulation 2/2 history of CVA -Warfarin per pharmacy -Current INR therapeutic     Leukocytosis -No fever and no infectious symptoms reported -Urinalysis unremarkable -Likely reactive in nature    HLD (hyperlipidemia) -Continue Zocor    Cough variant asthma -Continue Flonase and Dulera    DVT prophylaxis: on Coumadin  Code Status: DNR Family Communication:  Wife at bedside  Disposition Plan: Cont Tele  monitor while stroke w/u in progress   Consultants:   Neuro   Procedures:   None   Antimicrobials:   None    Subjective: No complaints this morning. Still continues to have left sided arm weakness.  States he did well overnight otherwise. He prefers to be at the rehab around his current living facility or here at [facility].   Objective: Vitals:   02/23/16 1956 02/24/16 0051 02/24/16 0458 02/24/16 0945  BP: (!) 147/78 (!) 150/84 (!) 143/67   Pulse: 72 89 77   Resp: 18 18 18    Temp: 98.5 F (36.9 C) 98.1 F (36.7 C) 97.5 F (36.4 C)   TempSrc: Oral Oral Oral   SpO2: 98% 95% 96% 94%  Weight:      Height:        Intake/Output Summary (Last 24 hours) at 02/24/16 1012 Last data filed at 02/24/16 0549  Gross per 24 hour  Intake                0 ml  Output              425 ml  Net             -425 ml   Filed Weights   02/22/16 1319  Weight: 76.7 kg (169 lb 1.5 oz)    Examination:  General exam: Appears calm and comfortable  Respiratory system: Clear to auscultation. Respiratory effort normal. Cardiovascular system: S1 & S2 heard, RRR. No JVD, murmurs, rubs, gallops or clicks. No pedal edema. Gastrointestinal system: Abdomen is nondistended, soft and nontender. No organomegaly or masses felt. Normal bowel  sounds heard. Central nervous system: Alert and oriented. RUE 5/5 in strenght, LUE 3+/5 in strength, 4+/5 for B/L Le, CN II-Xii intact.  Extremities: Symmetric 5 x 5 power. Skin: No rashes, lesions or ulcers Psychiatry: Judgement and insight appear normal. Mood & affect appropriate.     Data Reviewed:   CBC:  Recent Labs Lab 02/22/16 1306 02/22/16 1309 02/24/16 0857  WBC 13.2*  --  12.5*  NEUTROABS 6.3  --   --   HGB 14.1 15.0 16.3  HCT 42.5 44.0 49.3  MCV 94.0  --  93.4  PLT 373  --  [id number]*   Basic Metabolic Panel:  Recent Labs Lab 02/22/16 1306 02/22/16 1309 02/24/16 0857  NA 137 138 138  K 4.0 4.0 3.8  CL 104 103 103  CO2 23  --  24  GLUCOSE  88 87 165*  BUN 14 15 17   CREATININE 0.71 0.70 0.89  CALCIUM 8.4*  --  9.3   GFR: Estimated Creatinine Clearance: 62.5 mL/min (by C-G formula based on SCr of 0.89 mg/dL). Liver Function Tests:  Recent Labs Lab 02/22/16 1306  AST 20  ALT 14*  ALKPHOS 67  BILITOT 0.5  PROT 6.6  ALBUMIN 3.5   No results for input(s): LIPASE, AMYLASE in the last 168 hours. No results for input(s): AMMONIA in the last 168 hours. Coagulation Profile:  Recent Labs Lab 02/22/16 1306 02/23/16 0651 02/24/16 0857  INR 2.03 2.03 1.96   Cardiac Enzymes: No results for input(s): CKTOTAL, CKMB, CKMBINDEX, TROPONINI in the last 168 hours. BNP (last 3 results) No results for input(s): PROBNP in the last 8760 hours. HbA1C:  Recent Labs  02/23/16 0651  HGBA1C 6.2*   CBG:  Recent Labs Lab 02/22/16 1304  GLUCAP 84   Lipid Profile:  Recent Labs  02/23/16 0651  CHOL 179  HDL 59  LDLCALC 99  TRIG 103  CHOLHDL 3.0   Thyroid Function Tests: No results for input(s): TSH, T4TOTAL, FREET4, T3FREE, THYROIDAB in the last 72 hours. Anemia Panel: No results for input(s): VITAMINB12, FOLATE, FERRITIN, TIBC, IRON, RETICCTPCT in the last 72 hours. Sepsis Labs: No results for input(s): PROCALCITON, LATICACIDVEN in the last 168 hours.  No results found for this or any previous visit (from the past 240 hour(s)).       Radiology Studies: Mr Virgel Paling [id number] Contrast  Result Date: 02/22/2016 CLINICAL DATA:  Stroke symptoms.  Left-sided weakness slurred speech EXAM: MRI HEAD WITHOUT CONTRAST MRA HEAD WITHOUT CONTRAST TECHNIQUE: Multiplanar, multiecho pulse sequences of the brain and surrounding structures were obtained without intravenous contrast. Angiographic images of the head were obtained using MRA technique without contrast. COMPARISON:  CT head 02/22/2016  . FINDINGS: MRI HEAD FINDINGS Brain: Small focus of restricted diffusion in the posterior limb internal capsule on the right involving the  lateral thalamus. This measures approximately 7 mm in diameter and is consistent with acute infarct. No other acute infarct identified. Moderate atrophy. Ventricular enlargement consistent with atrophy. Chronic microvascular ischemic change in the white matter bilaterally. Chronic infarct right external capsule posteriorly. Pituitary not enlarged. Negative for intracranial hemorrhage or mass. No fluid collection. Vascular: Normal arterial flow voids. Skull and upper cervical spine: C1-2 arthropathy with prominent pannus behind the dens causing mild spinal stenosis. No bony lesion identified. Sinuses/Orbits: Extensive sinusitis with diffuse mucosal thickening throughout the paranasal sinuses. Air-fluid level left maxillary sinus. Extensive mastoid effusion bilaterally. Bilateral lens replacement. No orbital lesion. Other: None MRA HEAD FINDINGS Internal carotid artery widely patent  bilaterally. Anterior and middle cerebral arteries patent bilaterally without significant stenosis. Both vertebral arteries patent to the basilar. Left PICA patent. Right PICA not visualized. Basilar widely patent. Superior cerebellar and posterior cerebral arteries patent bilaterally. Negative for aneurysm. IMPRESSION: 7 mm acute infarct right posterior limb internal capsule and right lateral thalamus. Atrophy with chronic microvascular ischemic change. Negative MRA head Extensive sinusitis with air-fluid level left maxillary sinus. Electronically Signed   By: Franchot Gallo M.D.   On: 02/22/2016 17:01   Mr Brain Wo Contrast  Result Date: 02/22/2016 CLINICAL DATA:  Stroke symptoms.  Left-sided weakness slurred speech EXAM: MRI HEAD WITHOUT CONTRAST MRA HEAD WITHOUT CONTRAST TECHNIQUE: Multiplanar, multiecho pulse sequences of the brain and surrounding structures were obtained without intravenous contrast. Angiographic images of the head were obtained using MRA technique without contrast. COMPARISON:  CT head 02/22/2016  . FINDINGS: MRI  HEAD FINDINGS Brain: Small focus of restricted diffusion in the posterior limb internal capsule on the right involving the lateral thalamus. This measures approximately 7 mm in diameter and is consistent with acute infarct. No other acute infarct identified. Moderate atrophy. Ventricular enlargement consistent with atrophy. Chronic microvascular ischemic change in the white matter bilaterally. Chronic infarct right external capsule posteriorly. Pituitary not enlarged. Negative for intracranial hemorrhage or mass. No fluid collection. Vascular: Normal arterial flow voids. Skull and upper cervical spine: C1-2 arthropathy with prominent pannus behind the dens causing mild spinal stenosis. No bony lesion identified. Sinuses/Orbits: Extensive sinusitis with diffuse mucosal thickening throughout the paranasal sinuses. Air-fluid level left maxillary sinus. Extensive mastoid effusion bilaterally. Bilateral lens replacement. No orbital lesion. Other: None MRA HEAD FINDINGS Internal carotid artery widely patent bilaterally. Anterior and middle cerebral arteries patent bilaterally without significant stenosis. Both vertebral arteries patent to the basilar. Left PICA patent. Right PICA not visualized. Basilar widely patent. Superior cerebellar and posterior cerebral arteries patent bilaterally. Negative for aneurysm. IMPRESSION: 7 mm acute infarct right posterior limb internal capsule and right lateral thalamus. Atrophy with chronic microvascular ischemic change. Negative MRA head Extensive sinusitis with air-fluid level left maxillary sinus. Electronically Signed   By: Franchot Gallo M.D.   On: 02/22/2016 17:01   Ct Head Code Stroke W/o Cm  Result Date: 02/22/2016 CLINICAL DATA:  Code stroke. Left-sided weakness. Decreased coordination with tingling. EXAM: CT HEAD WITHOUT CONTRAST TECHNIQUE: Contiguous axial images were obtained from the base of the skull through the vertex without intravenous contrast. COMPARISON:   07/25/2015 FINDINGS: Brain: No evidence of acute infarction, hemorrhage, hydrocephalus, extra-axial collection or mass lesion/mass effect. Chronic microvascular disease with ischemic gliosis throughout the cerebral white matter. When compared to prior there are remote lacunar infarcts in the anterior limb right internal capsule and posterior right putamen. Vascular: Atherosclerotic calcification.  No hyperdense vessel. Skull: No acute or aggressive finding Sinuses/Orbits: Advanced sinusitis with diffuse mucosal thickening, essentially complete in bilateral ethmoid and frontal sinuses. Bilateral mastoid opacification, left more than right. Other: Text page was sent at the time of interpretation on 02/22/2016 at 1:17 pm to Dr. Roland Rack , who subsequently verbally acknowledged these results. ASPECTS [facility] Stroke Program Early CT Score) - Ganglionic level infarction (caudate, lentiform nuclei, internal capsule, insula, M1-M3 cortex): 7 when accounting for chronic changes. - Supraganglionic infarction (M4-M6 cortex): 3 Total score (0-10 with 10 being normal): 10 IMPRESSION: 1. No acute finding. 2. Chronic microvascular disease, moderate. 3. Advanced chronic sinusitis. 4. Left more than right mastoid opacification which has developed since June 2017. Electronically Signed   By: Angelica Chessman  Watts M.D.   On: 02/22/2016 13:33        Scheduled Meds: . fluticasone  2 spray Each Nare Daily  . mometasone-formoterol  2 puff Inhalation BID  . simvastatin  40 mg Oral Daily  . Warfarin - Pharmacist Dosing Inpatient   Does not apply q1800   Continuous Infusions:   LOS: 1 day    Time spent: 35 mins     Ashanna Heinsohn Arsenio Loader, MD [facility] Pager [phone]   If 7PM-7AM, please contact night-coverage [email or web address] Password [email or web address] 02/24/2016, 10:12 AM

## 2016-02-24 NOTE — Progress Notes (Signed)
I met with pt and his wife at bedside to discuss his rehab venue options. Pt very active and independent pta living in an [place] at [place] which has all three levels of care in the community. They now prefer inpt rehab rather than SNF in their community for intense rehab with medical management and rehabilitative nursing to hopefully get pt back to his active lifestyle as he had before. I will begin insurance approval for a possible admission pending insurance approval. [phone]

## 2016-02-24 NOTE — Consult Note (Signed)
Physical Medicine and Rehabilitation Consult   Reason for Consult: Left sided weakness and slurred speech.  Referring Physician: Dr. Leonie Man   HPI: [name] is a 83 y.o. RH-male with history of DM-diet controlled, HTN,  A fib, CVA-on chronic coumadin who was admitted on 02/22/16 with left sided weakness and ataxia. INR at admission 2.03. Patient with history of GIB 01/2016 that was treated with Vitamin K and had not been therapeutic all month. MRI brain done revealing 7 mm acute infarct right posterior limb internal capsule and right lateral thalamus. MRA negative. Carotid dopplers without significant ICA stenosis.  BLE dopplers negative for DVT.  Stroke felt to be embolic due to A fib and subtherapeutic INR.  PT/OT evaluations done and CIR recommended but patient initially preferred SNF for rehab. Lives at independent living at [facility] and would like the most intensive program. He and his wife would like therapy here followed by rehab at their facility.  Discussed different types of rehab as well as need for insurance aspects regarding coverage.  He was independent and active without AD--does chair yoga and chair volley ball at couple times a week and golf's on good days.   Review of Systems  Constitutional: Negative for chills and fever.  HENT: Negative.   Eyes: Negative.   Cardiovascular: Negative.   Musculoskeletal: Negative for back pain and myalgias.  Neurological: Positive for speech change, focal weakness and weakness. Negative for tingling and sensory change.  All other systems reviewed and are negative.   Past Medical History:  Diagnosis Date  . Acute CVA (cerebrovascular accident) ([place]) 07/25/2015  . Asthma   . Cerebral infarction due to embolism of left middle cerebral artery ([place]) 02/03/2014  . Colitis   . Colon polyps   . Diabetes mellitus type 2, diet-controlled ([place]) 12/22/2013  . Diverticulosis   . GERD (gastroesophageal reflux disease)   . Hemorrhoids    . Hiatal hernia   . History of esophageal strciture   . HLD (hyperlipidemia)   . Osteoarthritis   . Proctitis   . Status post dilation of esophageal narrowing   . Stroke [facility])     Past Surgical History:  Procedure Laterality Date  . ABDOMINAL HERNIA REPAIR    . esophageal hernia    . INSERTION OF MESH  01/29/2012   Procedure: INSERTION OF MESH;  Surgeon: Gwenyth Ober, MD;  Location: [place];  Service: General;  Laterality: N/A;  . nissen    . SPLENECTOMY, TOTAL     nontraumatic rupture  . VENTRAL HERNIA REPAIR  01/29/2012    WITH MESH  . VENTRAL HERNIA REPAIR  01/29/2012   Procedure: HERNIA REPAIR VENTRAL ADULT;  Surgeon: Gwenyth Ober, MD;  Location: [place];  Service: General;  Laterality: N/A;  open recurrent ventral hernia repair with mesh    Family History  Problem Relation Age of Onset  . Cancer Father     Social History:  Married. 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 He  reports that he has never smoked. He has never used smokeless tobacco. He reports that he drinks about 0.6 oz of alcohol per week . He reports that he does not use drugs.     Allergies  Allergen Reactions  . Azithromycin Rash    Possible reaction to Z-Pak    Medications Prior to Admission  Medication Sig Dispense Refill  . acetaminophen (TYLENOL) 500 MG tablet Take 1,000 mg by mouth every 6 (six) hours as needed  for headache (pain).    Marland Kitchen albuterol (PROAIR HFA) 108 (90 Base) MCG/ACT inhaler Inhale 2 puffs into the lungs every 6 (six) hours as needed for wheezing or shortness of breath.    . fluticasone (FLONASE) 50 MCG/ACT nasal spray Place 2 sprays into both nostrils at bedtime.   5  . loratadine (CLARITIN) 10 MG tablet Take 10 mg by mouth daily.    . mometasone-formoterol (DULERA) 100-5 MCG/ACT AERO Inhale 2 puffs into the lungs 2 (two) times daily. 1 Inhaler 0  . montelukast (SINGULAIR) 10 MG tablet Take 1 tablet (10 mg total) by mouth at bedtime. 30 tablet 11  . simvastatin (ZOCOR) 20 MG tablet  Take 20 mg by mouth at bedtime.     Marland Kitchen warfarin (COUMADIN) 5 MG tablet Take 2.5-5 mg by mouth See admin instructions. Managed by [employer] (filled as "Jantoven")  Take 1/2 tablet (2.5 mg) by mouth on Sundays, take 1 tablet (5 mg) on Monday thru Saturday    . hydrocortisone (ANUSOL-HC) 25 MG suppository Place 1 suppository (25 mg total) rectally 2 (two) times daily. (Patient not taking: Reported on 02/22/2016) 12 suppository 0    Home: Home Living Family/patient expects to be discharged to:: Private residence (Independent living cottage at [facility]) Living Arrangements: Spouse/significant other Available Help at Discharge: Family, Available 24 hours/day Type of Home: Independent living facility Home Access: Level entry Home Layout: One level Bathroom Shower/Tub: Multimedia programmer: Handicapped height Bathroom Accessibility: Yes Home Equipment: Bedside commode, Grab bars - tub/shower, Hand held shower head Additional Comments: lives at [place] History: Prior Function Level of Independence: Independent Comments: Plays golf, corn hole, chair volleyball, chair yoga; drives Functional Status:  Mobility: Bed Mobility Overal bed mobility: Needs Assistance Bed Mobility: Supine to Sit Supine to sit: Mod assist General bed mobility comments: assist with LLE and to elevate trunk  Transfers Overall transfer level: Needs assistance Equipment used: Rolling walker (2 wheeled) Transfers: Sit to/from Stand Sit to Stand: Mod assist General transfer comment: assist to power up and placement of L hand on RW Ambulation/Gait General Gait Details: pre-gait activities performed in stance with RW: attaining midline and lateral weight shifts; mod assist required to maintain upright stance; pt with need +2 assist to progress ambulation     ADL: ADL Overall ADL's : Needs assistance/impaired Eating/Feeding: Supervision/ safety, Set up, Sitting Grooming: Minimal assistance,  Sitting Upper Body Bathing: Minimal assistance, Sitting Lower Body Bathing: Maximal assistance, Sit to/from stand Upper Body Dressing : Minimal assistance, Sitting Lower Body Dressing: Maximal assistance, Sit to/from stand Toilet Transfer: BSC, Maximal assistance, Ambulation, RW Toilet Transfer Details (indicate cue type and reason): Took a few steps with transfer with heavy L lateral lean on therapist. Only able to weight shift minimally for steps. Toileting- Clothing Manipulation and Hygiene: Maximal assistance, Sit to/from stand General ADL Comments: Pt educated on compensatory dressing techniques and positioning of L UE. Pt and wife eager to learn strategies and motivated for rehabilitation. Pt able to complete PROM moving L UE with R and encouraged to continue this a few times per day.  Cognition: Cognition Overall Cognitive Status: Within Functional Limits for tasks assessed Orientation Level: Oriented X4 Cognition Arousal/Alertness: Awake/alert Behavior During Therapy: WFL for tasks assessed/performed Overall Cognitive Status: Within Functional Limits for tasks assessed General Comments: Cognition in tact for tasks assessed, will continue to monitor  Blood pressure (!) 143/67, pulse 77, temperature 97.5 F (36.4 C), temperature source Oral, resp. rate 18, height 5\' 6"  (  1.676 m), weight 76.7 kg (169 lb 1.5 oz), SpO2 94 %. Physical Exam  Nursing note and vitals reviewed. Constitutional: He is oriented to person, place, and time. He appears well-developed and well-nourished. No distress.  HENT:  Head: Normocephalic and atraumatic.  Eyes: Conjunctivae and EOM are normal. Pupils are equal, round, and reactive to light.  Neck: Normal range of motion. Neck supple.  Cardiovascular: Normal rate and regular rhythm.   Respiratory: Effort normal and breath sounds normal. No stridor. No respiratory distress. He has no wheezes.  GI: Soft. Bowel sounds are normal. He exhibits no distension.  There is no tenderness.  Musculoskeletal: He exhibits no edema or tenderness.  Neurological: He is alert and oriented to person, place, and time.  Mild left facial weakness with dysarthria.  Able to follow basic one and two step commands. Sensation intact to light touch.  DTRs symmetric Motor: RUE/RLE: 5/5 proximal to distal LUE: 2/5 proximally, 4/5 distally with apraxia and ataxia. LLE: HF, KE 3/5, ADF 1/5 with apraxia and ataxia  Skin: Skin is warm and dry. He is not diaphoretic.  Psychiatric: He has a normal mood and affect. His behavior is normal. Judgment and thought content normal.    Results for orders placed or performed during the hospital encounter of 02/22/16 (from the past 24 hour(s))  Protime-INR     Status: Abnormal   Collection Time: 02/24/16  8:57 AM  Result Value Ref Range   Prothrombin Time 22.7 (H) 11.4 - 15.2 seconds   INR [id number]   Basic metabolic panel     Status: Abnormal   Collection Time: 02/24/16  8:57 AM  Result Value Ref Range   Sodium 138 135 - 145 mmol/L   Potassium 3.8 3.5 - 5.1 mmol/L   Chloride 103 101 - 111 mmol/L   CO2 24 22 - 32 mmol/L   Glucose, Bld 165 (H) 65 - 99 mg/dL   BUN 17 6 - 20 mg/dL   Creatinine, Ser 0.89 0.61 - 1.24 mg/dL   Calcium 9.3 8.9 - 10.3 mg/dL   GFR calc non Af Amer >60 >60 mL/min   GFR calc Af Amer >60 >60 mL/min   Anion gap 11 5 - 15  CBC     Status: Abnormal   Collection Time: 02/24/16  8:57 AM  Result Value Ref Range   WBC 12.5 (H) 4.0 - 10.5 K/uL   RBC 5.28 4.22 - 5.81 MIL/uL   Hemoglobin 16.3 13.0 - 17.0 g/dL   HCT 49.3 39.0 - 52.0 %   MCV 93.4 78.0 - 100.0 fL   MCH 30.9 26.0 - 34.0 pg   MCHC 33.1 30.0 - 36.0 g/dL   RDW 14.9 11.5 - 15.5 %   Platelets 434 (H) 150 - 400 K/uL   Mr Virgel Paling Wo Contrast  Result Date: 02/22/2016 CLINICAL DATA:  Stroke symptoms.  Left-sided weakness slurred speech EXAM: MRI HEAD WITHOUT CONTRAST MRA HEAD WITHOUT CONTRAST TECHNIQUE: Multiplanar, multiecho pulse sequences of the brain  and surrounding structures were obtained without intravenous contrast. Angiographic images of the head were obtained using MRA technique without contrast. COMPARISON:  CT head 02/22/2016  . FINDINGS: MRI HEAD FINDINGS Brain: Small focus of restricted diffusion in the posterior limb internal capsule on the right involving the lateral thalamus. This measures approximately 7 mm in diameter and is consistent with acute infarct. No other acute infarct identified. Moderate atrophy. Ventricular enlargement consistent with atrophy. Chronic microvascular ischemic change in the white matter bilaterally. Chronic infarct right  external capsule posteriorly. Pituitary not enlarged. Negative for intracranial hemorrhage or mass. No fluid collection. Vascular: Normal arterial flow voids. Skull and upper cervical spine: C1-2 arthropathy with prominent pannus behind the dens causing mild spinal stenosis. No bony lesion identified. Sinuses/Orbits: Extensive sinusitis with diffuse mucosal thickening throughout the paranasal sinuses. Air-fluid level left maxillary sinus. Extensive mastoid effusion bilaterally. Bilateral lens replacement. No orbital lesion. Other: None MRA HEAD FINDINGS Internal carotid artery widely patent bilaterally. Anterior and middle cerebral arteries patent bilaterally without significant stenosis. Both vertebral arteries patent to the basilar. Left PICA patent. Right PICA not visualized. Basilar widely patent. Superior cerebellar and posterior cerebral arteries patent bilaterally. Negative for aneurysm. IMPRESSION: 7 mm acute infarct right posterior limb internal capsule and right lateral thalamus. Atrophy with chronic microvascular ischemic change. Negative MRA head Extensive sinusitis with air-fluid level left maxillary sinus. Electronically Signed   By: Franchot Gallo M.D.   On: 02/22/2016 17:01   Mr Brain Wo Contrast  Result Date: 02/22/2016 CLINICAL DATA:  Stroke symptoms.  Left-sided weakness slurred  speech EXAM: MRI HEAD WITHOUT CONTRAST MRA HEAD WITHOUT CONTRAST TECHNIQUE: Multiplanar, multiecho pulse sequences of the brain and surrounding structures were obtained without intravenous contrast. Angiographic images of the head were obtained using MRA technique without contrast. COMPARISON:  CT head 02/22/2016  . FINDINGS: MRI HEAD FINDINGS Brain: Small focus of restricted diffusion in the posterior limb internal capsule on the right involving the lateral thalamus. This measures approximately 7 mm in diameter and is consistent with acute infarct. No other acute infarct identified. Moderate atrophy. Ventricular enlargement consistent with atrophy. Chronic microvascular ischemic change in the white matter bilaterally. Chronic infarct right external capsule posteriorly. Pituitary not enlarged. Negative for intracranial hemorrhage or mass. No fluid collection. Vascular: Normal arterial flow voids. Skull and upper cervical spine: C1-2 arthropathy with prominent pannus behind the dens causing mild spinal stenosis. No bony lesion identified. Sinuses/Orbits: Extensive sinusitis with diffuse mucosal thickening throughout the paranasal sinuses. Air-fluid level left maxillary sinus. Extensive mastoid effusion bilaterally. Bilateral lens replacement. No orbital lesion. Other: None MRA HEAD FINDINGS Internal carotid artery widely patent bilaterally. Anterior and middle cerebral arteries patent bilaterally without significant stenosis. Both vertebral arteries patent to the basilar. Left PICA patent. Right PICA not visualized. Basilar widely patent. Superior cerebellar and posterior cerebral arteries patent bilaterally. Negative for aneurysm. IMPRESSION: 7 mm acute infarct right posterior limb internal capsule and right lateral thalamus. Atrophy with chronic microvascular ischemic change. Negative MRA head Extensive sinusitis with air-fluid level left maxillary sinus. Electronically Signed   By: Franchot Gallo M.D.   On:  02/22/2016 17:01   Ct Head Code Stroke W/o Cm  Result Date: 02/22/2016 CLINICAL DATA:  Code stroke. Left-sided weakness. Decreased coordination with tingling. EXAM: CT HEAD WITHOUT CONTRAST TECHNIQUE: Contiguous axial images were obtained from the base of the skull through the vertex without intravenous contrast. COMPARISON:  07/25/2015 FINDINGS: Brain: No evidence of acute infarction, hemorrhage, hydrocephalus, extra-axial collection or mass lesion/mass effect. Chronic microvascular disease with ischemic gliosis throughout the cerebral white matter. When compared to prior there are remote lacunar infarcts in the anterior limb right internal capsule and posterior right putamen. Vascular: Atherosclerotic calcification.  No hyperdense vessel. Skull: No acute or aggressive finding Sinuses/Orbits: Advanced sinusitis with diffuse mucosal thickening, essentially complete in bilateral ethmoid and frontal sinuses. Bilateral mastoid opacification, left more than right. Other: Text page was sent at the time of interpretation on 02/22/2016 at 1:17 pm to Dr. Roland Rack , who subsequently  verbally acknowledged these results. ASPECTS [facility] Stroke Program Early CT Score) - Ganglionic level infarction (caudate, lentiform nuclei, internal capsule, insula, M1-M3 cortex): 7 when accounting for chronic changes. - Supraganglionic infarction (M4-M6 cortex): 3 Total score (0-10 with 10 being normal): 10 IMPRESSION: 1. No acute finding. 2. Chronic microvascular disease, moderate. 3. Advanced chronic sinusitis. 4. Left more than right mastoid opacification which has developed since June 2017. Electronically Signed   By: Monte Fantasia M.D.   On: 02/22/2016 13:33    Assessment/Plan: Diagnosis: acute infarct right posterior limb internal capsule and right lateral thalamus Labs and images independently reviewed.  Records reviewed and summated above. Stroke: Continue secondary stroke prophylaxis and Risk Factor Modification  listed below:   Antiplatelet therapy:   Blood Pressure Management:  Continue current medication with prn's with permisive HTN per primary team Statin Agent:   Diabetes management:   Left sided hemiparesis: fit for orthosis to prevent contractures (resting hand splint for day, wrist cock up splint at night, PRAFO, etc) Motor recovery: Fluoxetine  1. Does the need for close, 24 hr/day medical supervision in concert with the patient's rehab needs make it unreasonable for this patient to be served in a less intensive setting? Yes  2. Co-Morbidities requiring supervision/potential complications: GIB (need to monitor on anticoag), DM-diet controlled (Monitor in accordance with exercise and adjust meds as necessary), HTN (monitor and provide prns in accordance with increased physical exertion and pain), A fib (monitor HR with increased physical activity), history CVA (cont meds), leukocytosis (cont to monitor for signs and symptoms of infection, further workup if indicated), subtherapeutic INR (cont to monitor) 3. Due to safety, disease management and patient education, does the patient require 24 hr/day rehab nursing? Yes 4. Does the patient require coordinated care of a physician, rehab nurse, PT (1-2 hrs/day, 5 days/week), OT (1-2 hrs/day, 5 days/week) and SLP (1-2 hrs/day, 5 days/week) to address physical and functional deficits in the context of the above medical diagnosis(es)? Yes Addressing deficits in the following areas: balance, endurance, locomotion, strength, transferring, bathing, dressing, toileting, speech and psychosocial support 5. Can the patient actively participate in an intensive therapy program of at least 3 hrs of therapy per day at least 5 days per week? Yes 6. The potential for patient to make measurable gains while on inpatient rehab is excellent 7. Anticipated functional outcomes upon discharge from inpatient rehab are supervision and min assist  with PT, supervision and min assist  with OT, independent and modified independent with SLP. 8. Estimated rehab length of stay to reach the above functional goals is: 16-19 days. 9. Does the patient have adequate social supports and living environment to accommodate these discharge functional goals? Yes 10. Anticipated D/C setting: Home 11. Anticipated post D/C treatments: HH therapy and Home excercise program 12. Overall Rehab/Functional Prognosis: good  RECOMMENDATIONS: This patient's condition is appropriate for continued rehabilitative care in the following setting: CIR Patient has agreed to participate in recommended program. Yes Note that insurance prior authorization may be required for reimbursement for recommended care.  Comment: Rehab Admissions Coordinator to follow up.  Flora Lipps 02/24/2016  Delice Lesch, MD, [facility]

## 2016-02-25 ENCOUNTER — Ambulatory Visit: Payer: Medicare HMO | Admitting: Internal Medicine

## 2016-02-25 LAB — BASIC METABOLIC PANEL
ANION GAP: 8 (ref 5–15)
BUN: 15 mg/dL (ref 6–20)
CALCIUM: 9 mg/dL (ref 8.9–10.3)
CHLORIDE: 103 mmol/L (ref 101–111)
CO2: 27 mmol/L (ref 22–32)
CREATININE: 0.77 mg/dL (ref 0.61–1.24)
GFR calc Af Amer: >60 mL/min (ref >60)
GFR calc non Af Amer: >60 mL/min (ref >60)
GLUCOSE: 126 mg/dL — AB (ref 65–99)
Potassium: 4 mmol/L (ref 3.5–5.1)
Sodium: 138 mmol/L (ref 135–145)

## 2016-02-25 LAB — PROTIME-INR
INR: 1.92
PROTHROMBIN TIME: 22.3 s — AB (ref 11.4–15.2)

## 2016-02-25 LAB — CBC
HCT: 47 % (ref 39.0–52.0)
HEMOGLOBIN: 15.3 g/dL (ref 13.0–17.0)
MCH: 30.7 pg (ref 26.0–34.0)
MCHC: 32.6 g/dL (ref 30.0–36.0)
MCV: 94.2 fL (ref 78.0–100.0)
PLATELETS: 405 10*3/uL — AB (ref 150–400)
RBC: 4.99 MIL/uL (ref 4.22–5.81)
RDW: 15.1 % (ref 11.5–15.5)
WBC: 13.4 10*3/uL — ABNORMAL HIGH (ref 4.0–10.5)

## 2016-02-25 MED ORDER — CARVEDILOL 3.125 MG PO TABS
3.1250 mg | ORAL_TABLET | Freq: Two times a day (BID) | ORAL | Status: DC
  Administered 2016-02-25 – 2016-02-27 (×5): 3.125 mg via ORAL
  Filled 2016-02-25 (×5): qty 1

## 2016-02-25 NOTE — Progress Notes (Signed)
PROGRESS NOTE    [name]  [id number] DOB: Sep 13, 1931 DOA: 02/22/2016 PCP: Kandice Hams, MD     Assessment & Plan:   Principal Problem:   Stroke-like symptom Active Problems:   Diabetes mellitus type 2, diet-controlled (HCC)   HLD (hyperlipidemia)   Cough variant asthma   Chronic anticoagulation 2/2 history of CVA   History of CVA (cerebrovascular accident)   History of lower GI bleeding Dec 2017   Leukocytosis   Acute CVA (cerebrovascular accident) ([place])   Diabetes mellitus with complication (HCC)   Benign essential HTN   PAF (paroxysmal atrial fibrillation) (HCC)   Subtherapeutic international normalized ratio (INR)  Acute CVA with right thalamic infarct -continues to have left sided UE weakness -wasn't a tPA candidate at the time of the admission as he was on therapeutic coumadin.  -Initial CT head negative -MRI/MRA brain- shows 65mm right thalamic infarct  -Echocardiogram - shows ef 45% with grade I diastolic dysfunction, Carotid duplex done- 1-39% b/l stenosis -Neurology following -Risk factor stratification: A1c results 6.2 - should control with diet and exercise.;  LDL is 99 -zocor increased to 40mg  po daily  -PT/OT/SLP evaluation in progress- will require CIR  -Frequent neurological checks - Coumadin discontinued yesterday and placed on ASA 325mg  per neuro  - social worker consulted to help facilitate placement.   Combined Systolic and Diastolic CHF, compensated at this time- New Dx -cont current meds, added BB for now.  -I have talked to his PCP, Dr Delfina Redwood, who will refer to outpatient Cardiology for further work up and follow up. Apparently heh as seen Dr Oran Rein in the past.     History of lower GI bleeding Dec 2017 -No further recurrence of symptoms since discharge -Hemoglobin stable     Diabetes mellitus type 2, diet-controlled -Current CBG control -A1c: 6.2 at this time. Need to strictly control with diet and exercise.     Chronic  anticoagulation 2/2 history of CVA -coumadin discontinued as it did not seem beneficial for his CVA given he had a stroke while being therapeutic INR. So in the setting of hx of recent GI bleed it was deemed appropriate to take him of coumadin considering risk and benefit and he was placed on ASA 325mg  po daily .    Leukocytosis -No fever and no infectious symptoms reported -Urinalysis unremarkable -Likely reactive in nature    HLD (hyperlipidemia) -Continue Zocor    Cough variant asthma -Continue Flonase and Dulera    DVT prophylaxis: on Coumadin  Code Status: DNR Family Communication:  Wife at bedside  Disposition Plan: Placement - likely CIR  Consultants:   Neuro   Procedures:   None   Antimicrobials:   None    Subjective: No complaints this morning. Still continues to have left sided arm weakness.    Objective: Vitals:   02/24/16 2225 02/25/16 0113 02/25/16 0654 02/25/16 1023  BP: 131/84 (!) 117/43 (!) 130/96 108/74  Pulse: 77 77 80 78  Resp: 16 16 16 20   Temp: 98.9 F (37.2 C) 98.5 F (36.9 C) 98 F (36.7 C) 97.4 F (36.3 C)  TempSrc: Oral Oral Oral Oral  SpO2: 96% 94% 95% 97%  Weight:      Height:        Intake/Output Summary (Last 24 hours) at 02/25/16 1233 Last data filed at 02/25/16 0745  Gross per 24 hour  Intake                0 ml  Output  1400 ml  Net            -1400 ml   Filed Weights   02/22/16 1319  Weight: 76.7 kg (169 lb 1.5 oz)    Examination:  General exam: Appears calm and comfortable  Respiratory system: Clear to auscultation. Respiratory effort normal. Cardiovascular system: S1 & S2 heard, RRR. No JVD, murmurs, rubs, gallops or clicks. No pedal edema. Gastrointestinal system: Abdomen is nondistended, soft and nontender. No organomegaly or masses felt. Normal bowel sounds heard. Central nervous system: Alert and oriented. RUE 5/5 in strenght, LUE 3+/5 in strength, 4+/5 for B/L Le, CN II-Xii intact.    Extremities: Symmetric 5 x 5 power. Skin: No rashes, lesions or ulcers Psychiatry: Judgement and insight appear normal. Mood & affect appropriate.     Data Reviewed:   CBC:  Recent Labs Lab 02/22/16 1306 02/22/16 1309 02/24/16 0857 02/25/16 0510  WBC 13.2*  --  12.5* 13.4*  NEUTROABS 6.3  --   --   --   HGB 14.1 15.0 16.3 15.3  HCT 42.5 44.0 49.3 47.0  MCV 94.0  --  93.4 94.2  PLT 373  --  434* [id number]*   Basic Metabolic Panel:  Recent Labs Lab 02/22/16 1306 02/22/16 1309 02/24/16 0857 02/25/16 0510  NA 137 138 138 138  K 4.0 4.0 3.8 4.0  CL 104 103 103 103  CO2 23  --  24 27  GLUCOSE 88 87 165* 126*  BUN 14 15 17 15   CREATININE 0.71 0.70 0.89 0.77  CALCIUM 8.4*  --  9.3 9.0   GFR: Estimated Creatinine Clearance: 69.5 mL/min (by C-G formula based on SCr of 0.77 mg/dL). Liver Function Tests:  Recent Labs Lab 02/22/16 1306  AST 20  ALT 14*  ALKPHOS 67  BILITOT 0.5  PROT 6.6  ALBUMIN 3.5   No results for input(s): LIPASE, AMYLASE in the last 168 hours. No results for input(s): AMMONIA in the last 168 hours. Coagulation Profile:  Recent Labs Lab 02/22/16 1306 02/23/16 0651 02/24/16 0857 02/25/16 0510  INR 2.03 2.03 1.96 1.92   Cardiac Enzymes: No results for input(s): CKTOTAL, CKMB, CKMBINDEX, TROPONINI in the last 168 hours. BNP (last 3 results) No results for input(s): PROBNP in the last 8760 hours. HbA1C:  Recent Labs  02/23/16 0651  HGBA1C 6.2*   CBG:  Recent Labs Lab 02/22/16 1304  GLUCAP 84   Lipid Profile:  Recent Labs  02/23/16 0651  CHOL 179  HDL 59  LDLCALC 99  TRIG 103  CHOLHDL 3.0   Thyroid Function Tests: No results for input(s): TSH, T4TOTAL, FREET4, T3FREE, THYROIDAB in the last 72 hours. Anemia Panel: No results for input(s): VITAMINB12, FOLATE, FERRITIN, TIBC, IRON, RETICCTPCT in the last 72 hours. Sepsis Labs: No results for input(s): PROCALCITON, LATICACIDVEN in the last 168 hours.  No results found for  this or any previous visit (from the past 240 hour(s)).       Radiology Studies: No results found.      Scheduled Meds: . aspirin EC  325 mg Oral Daily  . carvedilol  3.125 mg Oral BID WC  . fluticasone  2 spray Each Nare Daily  . mometasone-formoterol  2 puff Inhalation BID  . simvastatin  40 mg Oral Daily   Continuous Infusions:   LOS: 2 days    Time spent: 35 mins     Venissa Nappi Arsenio Loader, MD [facility] Pager [phone]   If 7PM-7AM, please contact night-coverage [email or web address] Password [facility] 02/25/2016,  12:33 PM

## 2016-02-25 NOTE — Progress Notes (Signed)
SLP Cancellation Note  Patient Details Name: [name] MRN: [id number] DOB: 12-28-1931   Cancelled treatment:       Reason Eval/Treat Not Completed: SLP screened, no needs identified, will sign off No deficits noted in acute setting by pt, family or staff. Will defer to next venue of care for any further assessment if warranted.    [name] 02/25/2016, 2:39 PM

## 2016-02-25 NOTE — Discharge Summary (Signed)
Physician Discharge Summary  [name] [id number] DOB: [birth year] DOA: 02/22/2016  PCP: Kandice Hams, MD  Admit date: 02/22/2016 Discharge date: 02/26/16  Admitted From: Assisted Living  Disposition: Inpatient Rehab  Recommendations for Outpatient Follow-up:  1. Follow up with PCP in 1-2 weeks 2. Follow up Neurology in 4-6 weeks at the [place]: TBD Equipment/Devices: Per Rehab Team  Discharge Condition: Stable  CODE STATUS: DNR Diet recommendation: Prehospitalization diet.   Brief/Interim Summary Patient was admitted to the hospital with complaints of left sided Upper extremity weakness and slightly slurrying of the speech. He was found to have 13mm acute infarct in right internal capsule and thalamic area. He was already on Coumadin from his previous strokes with therapeutic INR at the time of the admission. His further work up showed no significant stenosis of Carotids, Prediabetic A1c 6.2, and slightly elevated LDL of 99 therefore his Zocor was increased from 20mg  to 40mg . His Echo showed ef 45% with grade I diastolic Dysfunction without any hx of CHF. He was started on Coreg and I had discussed it with his PCP who will refer him to Cardiology for outpatient work up. Meanwhile it was determined that Coumadin wasn't helping him matter a fact due to recent History of diverticular GI bleed and intermittent hx of bleeds at home, it was best to take him off and place him on ASA 325mg  po daily. This was also discussed with his PCP by Dr Leonie Man (neurologist, stroke team).  I have also added Requip 0.25 mg qhs for his bothersome restless leg syndrome that he reported at the time of the discharge.  He was evaluated by the Physical therapy team and it was deemed he would benefit from inpatient rehab.   Discharge Diagnoses:  Principal Problem:   Stroke-like symptom Active Problems:   Diabetes mellitus type 2, diet-controlled (HCC)   HLD (hyperlipidemia)   Cough variant  asthma   Chronic anticoagulation 2/2 history of CVA   History of CVA (cerebrovascular accident)   History of lower GI bleeding Dec 2017   Leukocytosis   Acute CVA (cerebrovascular accident) ([place])   Diabetes mellitus with complication (HCC)   Benign essential HTN   PAF (paroxysmal atrial fibrillation) (HCC)   Subtherapeutic international normalized ratio (INR)  Acute CVA with right thalamic infarct -continues to have left sided UE weakness -wasn't a tPA candidate at the time of the admission as he was on therapeutic coumadin.  -Initial CT head negative -MRI/MRA brain- shows 66mm right thalamic infarct  -Echocardiogram - shows ef 45% with grade I diastolic dysfunction, Carotid duplex done- 1-39% b/l stenosis -Risk factor stratification: A1c results 6.2 - should control with diet and exercise.;  LDL is 99 -zocor increased to 40mg  po daily  -PT/OT/SLP evaluation in progress- will require CIR  -Frequent neurological checks - Coumadin discontinued and placed on ASA 325mg  per neuro    Combined Systolic and Diastolic CHF, compensated at this time- New Dx -cont current meds, Coreg added now. Tolerating well. Can add ACEI/ARB as outpatient.  -I have talked to his PCP, Dr Delfina Redwood, who will refer to outpatient Cardiology for further work up and follow up. Apparently heh as seen Dr Oran Rein in the past - cont ASA, Statin at this time.  RestLess Leg Syndrome -added Requip 0.25mg  qhs.   History of lower GI bleeding Dec 2017 -No further recurrence of symptoms since discharge -Hemoglobin stable   Diabetes mellitus type 2, diet-controlled -Current CBG control -A1c: 6.2 at this  time. Need to strictly control with diet and exercise.   Chronic anticoagulation 2/2 history of CVA -coumadin discontinued as it did not seem beneficial for his CVA given he had a stroke while being therapeutic INR. So in the setting of hx of recent GI bleed it was deemed appropriate to take him of coumadin  considering risk and benefit and he was placed on ASA 325mg  po daily .  HLD (hyperlipidemia) -Continue Zocor  Cough variant asthma -Continue Flonase and Dulera  Discharge Instructions   Allergies as of 02/26/2016      Reactions   Azithromycin Rash   Possible reaction to Z-Pak      Medication List    STOP taking these medications   warfarin 5 MG tablet Commonly known as:  COUMADIN     TAKE these medications   acetaminophen 500 MG tablet Commonly known as:  TYLENOL Take 1,000 mg by mouth every 6 (six) hours as needed for headache (pain).   aspirin 325 MG EC tablet Take 1 tablet (325 mg total) by mouth daily. Start taking on:  02/27/2016   carvedilol 3.125 MG tablet Commonly known as:  COREG Take 1 tablet (3.125 mg total) by mouth 2 (two) times daily with a meal.   fluticasone 50 MCG/ACT nasal spray Commonly known as:  FLONASE Place 2 sprays into both nostrils at bedtime.   hydrocortisone 25 MG suppository Commonly known as:  ANUSOL-HC Place 1 suppository (25 mg total) rectally 2 (two) times daily.   loratadine 10 MG tablet Commonly known as:  CLARITIN Take 10 mg by mouth daily.   mometasone-formoterol 100-5 MCG/ACT Aero Commonly known as:  DULERA Inhale 2 puffs into the lungs 2 (two) times daily.   montelukast 10 MG tablet Commonly known as:  SINGULAIR Take 1 tablet (10 mg total) by mouth at bedtime.   PROAIR HFA 108 (90 Base) MCG/ACT inhaler Generic drug:  albuterol Inhale 2 puffs into the lungs every 6 (six) hours as needed for wheezing or shortness of breath.   rOPINIRole 0.25 MG tablet Commonly known as:  REQUIP Take 1 tablet (0.25 mg total) by mouth at bedtime.   simvastatin 40 MG tablet Commonly known as:  ZOCOR Take 1 tablet (40 mg total) by mouth daily. What changed:  medication strength  how much to take  when to take this   zolpidem 5 MG tablet Commonly known as:  AMBIEN Take 1 tablet (5 mg total) by mouth at bedtime as needed  for sleep.      Follow-up Information    SETHI,PRAMOD, MD Follow up in 1 month(s).   Specialties:  Neurology, Radiology Contact information: [address] [place] [address] [place], [place] Follow up in 2 week(s).   Specialty:  Internal Medicine Why:  Follow up and also needs Cardiology refer for furhter work up as mentioned on Discharge paperwork.  Contact information: [address] [place]  [address] [phone]          Allergies  Allergen Reactions  . Azithromycin Rash    Possible reaction to Z-Pak    Consultations:  Neurology, Dr Leonie Man    Procedures/Studies: Mr [facility] Wo Contrast  Result Date: 02/22/2016 CLINICAL DATA:  Stroke symptoms.  Left-sided weakness slurred speech EXAM: MRI HEAD WITHOUT CONTRAST MRA HEAD WITHOUT CONTRAST TECHNIQUE: Multiplanar, multiecho pulse sequences of the brain and surrounding structures were obtained without intravenous contrast. Angiographic images of the head were obtained  using MRA technique without contrast. COMPARISON:  CT head 02/22/2016  . FINDINGS: MRI HEAD FINDINGS Brain: Small focus of restricted diffusion in the posterior limb internal capsule on the right involving the lateral thalamus. This measures approximately 7 mm in diameter and is consistent with acute infarct. No other acute infarct identified. Moderate atrophy. Ventricular enlargement consistent with atrophy. Chronic microvascular ischemic change in the white matter bilaterally. Chronic infarct right external capsule posteriorly. Pituitary not enlarged. Negative for intracranial hemorrhage or mass. No fluid collection. Vascular: Normal arterial flow voids. Skull and upper cervical spine: C1-2 arthropathy with prominent pannus behind the dens causing mild spinal stenosis. No bony lesion identified. Sinuses/Orbits: Extensive sinusitis with diffuse mucosal thickening throughout the paranasal sinuses. Air-fluid  level left maxillary sinus. Extensive mastoid effusion bilaterally. Bilateral lens replacement. No orbital lesion. Other: None MRA HEAD FINDINGS Internal carotid artery widely patent bilaterally. Anterior and middle cerebral arteries patent bilaterally without significant stenosis. Both vertebral arteries patent to the basilar. Left PICA patent. Right PICA not visualized. Basilar widely patent. Superior cerebellar and posterior cerebral arteries patent bilaterally. Negative for aneurysm. IMPRESSION: 7 mm acute infarct right posterior limb internal capsule and right lateral thalamus. Atrophy with chronic microvascular ischemic change. Negative MRA head Extensive sinusitis with air-fluid level left maxillary sinus. Electronically Signed   By: Franchot Gallo M.D.   On: 02/22/2016 17:01   Mr Brain Wo Contrast  Result Date: 02/22/2016 CLINICAL DATA:  Stroke symptoms.  Left-sided weakness slurred speech EXAM: MRI HEAD WITHOUT CONTRAST MRA HEAD WITHOUT CONTRAST TECHNIQUE: Multiplanar, multiecho pulse sequences of the brain and surrounding structures were obtained without intravenous contrast. Angiographic images of the head were obtained using MRA technique without contrast. COMPARISON:  CT head 02/22/2016  . FINDINGS: MRI HEAD FINDINGS Brain: Small focus of restricted diffusion in the posterior limb internal capsule on the right involving the lateral thalamus. This measures approximately 7 mm in diameter and is consistent with acute infarct. No other acute infarct identified. Moderate atrophy. Ventricular enlargement consistent with atrophy. Chronic microvascular ischemic change in the white matter bilaterally. Chronic infarct right external capsule posteriorly. Pituitary not enlarged. Negative for intracranial hemorrhage or mass. No fluid collection. Vascular: Normal arterial flow voids. Skull and upper cervical spine: C1-2 arthropathy with prominent pannus behind the dens causing mild spinal stenosis. No bony lesion  identified. Sinuses/Orbits: Extensive sinusitis with diffuse mucosal thickening throughout the paranasal sinuses. Air-fluid level left maxillary sinus. Extensive mastoid effusion bilaterally. Bilateral lens replacement. No orbital lesion. Other: None MRA HEAD FINDINGS Internal carotid artery widely patent bilaterally. Anterior and middle cerebral arteries patent bilaterally without significant stenosis. Both vertebral arteries patent to the basilar. Left PICA patent. Right PICA not visualized. Basilar widely patent. Superior cerebellar and posterior cerebral arteries patent bilaterally. Negative for aneurysm. IMPRESSION: 7 mm acute infarct right posterior limb internal capsule and right lateral thalamus. Atrophy with chronic microvascular ischemic change. Negative MRA head Extensive sinusitis with air-fluid level left maxillary sinus. Electronically Signed   By: Franchot Gallo M.D.   On: 02/22/2016 17:01   Nm Gi Blood Loss  Result Date: 01/31/2016 CLINICAL DATA:  Bloody stool this morning. EXAM: NUCLEAR MEDICINE GASTROINTESTINAL BLEEDING SCAN TECHNIQUE: Sequential abdominal images were obtained following intravenous administration of Tc-59m labeled red blood cells. RADIOPHARMACEUTICALS:  24.7 mCi Tc-79m in-vitro labeled red cells. COMPARISON:  None. FINDINGS: No abnormal focus of activity is demonstrated in the bowel to suggest a lower GI bleed. Normal physiologic biodistribution of the radiotracer is demonstrated throughout the abdomen. IMPRESSION: No  scintigraphic evidence of active gastrointestinal bleeding. Electronically Signed   By: Kathreen Devoid   On: 01/31/2016 12:21   Ct Head Code Stroke W/o Cm  Result Date: 02/22/2016 CLINICAL DATA:  Code stroke. Left-sided weakness. Decreased coordination with tingling. EXAM: CT HEAD WITHOUT CONTRAST TECHNIQUE: Contiguous axial images were obtained from the base of the skull through the vertex without intravenous contrast. COMPARISON:  07/25/2015 FINDINGS: Brain:  No evidence of acute infarction, hemorrhage, hydrocephalus, extra-axial collection or mass lesion/mass effect. Chronic microvascular disease with ischemic gliosis throughout the cerebral white matter. When compared to prior there are remote lacunar infarcts in the anterior limb right internal capsule and posterior right putamen. Vascular: Atherosclerotic calcification.  No hyperdense vessel. Skull: No acute or aggressive finding Sinuses/Orbits: Advanced sinusitis with diffuse mucosal thickening, essentially complete in bilateral ethmoid and frontal sinuses. Bilateral mastoid opacification, left more than right. Other: Text page was sent at the time of interpretation on 02/22/2016 at 1:17 pm to Dr. Roland Rack , who subsequently verbally acknowledged these results. ASPECTS [facility] Stroke Program Early CT Score) - Ganglionic level infarction (caudate, lentiform nuclei, internal capsule, insula, M1-M3 cortex): 7 when accounting for chronic changes. - Supraganglionic infarction (M4-M6 cortex): 3 Total score (0-10 with 10 being normal): 10 IMPRESSION: 1. No acute finding. 2. Chronic microvascular disease, moderate. 3. Advanced chronic sinusitis. 4. Left more than right mastoid opacification which has developed since June 2017. Electronically Signed   By: Monte Fantasia M.D.   On: 02/22/2016 13:33      Subjective:   Discharge Exam: Vitals:   02/26/16 0528 02/26/16 1300  BP: 140/73 108/82  Pulse: 72 88  Resp: 16 18  Temp: 97.7 F (36.5 C)    Vitals:   02/25/16 2048 02/26/16 0032 02/26/16 0528 02/26/16 1300  BP: 122/73 125/72 140/73 108/82  Pulse: 79 78 72 88  Resp: 16 16 16 18   Temp: 98.5 F (36.9 C) 98.2 F (36.8 C) 97.7 F (36.5 C)   TempSrc: Oral Oral Oral Axillary  SpO2: 95% 93% 92% 100%  Weight:      Height:        General: Pt is alert, awake, not in acute distress Cardiovascular: RRR, S1/S2 +, no rubs, no gallops Respiratory: CTA bilaterally, no wheezing, no  rhonchi Abdominal: Soft, NT, ND, bowel sounds + Extremities: no edema, no cyanosis Neuro: Alert and oriented. RUE 5/5 in strenght, LUE 3+/5 in strength, 4+/5 for B/L Le, CN II-Xii intact.     The results of significant diagnostics from this hospitalization (including imaging, microbiology, ancillary and laboratory) are listed below for reference.     Microbiology: No results found for this or any previous visit (from the past 240 hour(s)).   Labs: BNP (last 3 results) No results for input(s): BNP in the last 8760 hours. Basic Metabolic Panel:  Recent Labs Lab 02/22/16 1306 02/22/16 1309 02/24/16 0857 02/25/16 0510 02/26/16 0654  NA 137 138 138 138 137  K 4.0 4.0 3.8 4.0 3.6  CL 104 103 103 103 103  CO2 23  --  24 27 26   GLUCOSE 88 87 165* 126* 123*  BUN 14 15 17 15 16   CREATININE 0.71 0.70 0.89 0.77 0.75  CALCIUM 8.4*  --  9.3 9.0 8.8*   Liver Function Tests:  Recent Labs Lab 02/22/16 1306  AST 20  ALT 14*  ALKPHOS 67  BILITOT 0.5  PROT 6.6  ALBUMIN 3.5   No results for input(s): LIPASE, AMYLASE in the last 168 hours. No  results for input(s): AMMONIA in the last 168 hours. CBC:  Recent Labs Lab 02/22/16 1306 02/22/16 1309 02/24/16 0857 02/25/16 0510 02/26/16 0654  WBC 13.2*  --  12.5* 13.4* 11.6*  NEUTROABS 6.3  --   --   --   --   HGB 14.1 15.0 16.3 15.3 15.1  HCT 42.5 44.0 49.3 47.0 46.1  MCV 94.0  --  93.4 94.2 93.3  PLT 373  --  434* 405* 386   Cardiac Enzymes: No results for input(s): CKTOTAL, CKMB, CKMBINDEX, TROPONINI in the last 168 hours. BNP: Invalid input(s): POCBNP CBG:  Recent Labs Lab 02/22/16 1304  GLUCAP 84   D-Dimer No results for input(s): DDIMER in the last 72 hours. Hgb A1c No results for input(s): HGBA1C in the last 72 hours. Lipid Profile No results for input(s): CHOL, HDL, LDLCALC, TRIG, CHOLHDL, LDLDIRECT in the last 72 hours. Thyroid function studies No results for input(s): TSH, T4TOTAL, T3FREE, THYROIDAB in  the last 72 hours.  Invalid input(s): FREET3 Anemia work up No results for input(s): VITAMINB12, FOLATE, FERRITIN, TIBC, IRON, RETICCTPCT in the last 72 hours. Urinalysis    Component Value Date/Time   COLORURINE YELLOW 02/22/2016 1340   APPEARANCEUR CLEAR 02/22/2016 1340   LABSPEC 1.013 02/22/2016 1340   PHURINE 6.0 02/22/2016 1340   GLUCOSEU NEGATIVE 02/22/2016 1340   HGBUR NEGATIVE 02/22/2016 1340   BILIRUBINUR NEGATIVE 02/22/2016 1340   KETONESUR NEGATIVE 02/22/2016 1340   PROTEINUR NEGATIVE 02/22/2016 1340   UROBILINOGEN 0.2 12/22/2013 1210   NITRITE NEGATIVE 02/22/2016 1340   LEUKOCYTESUR NEGATIVE 02/22/2016 1340   Sepsis Labs Invalid input(s): PROCALCITONIN,  WBC,  LACTICIDVEN Microbiology No results found for this or any previous visit (from the past 240 hour(s)).   Time coordinating discharge: Over 30 minutes  SIGNED:   Damita Lack, MD  [facility] 02/26/2016, 2:20 PM Pager   If 7PM-7AM, please contact night-coverage [email or web address] Password [email or web address]

## 2016-02-25 NOTE — Progress Notes (Signed)
STROKE TEAM PROGRESS NOTE   HISTORY OF PRESENT ILLNESS (per record) [name] is a 83 y.o. male who presents with new onset left-sided weakness and ataxia that started at 11:15 AM on 02/22/2016 ([facility]KW). He was completely normal prior to this. It is not static deficit, no progression or improvement. tPA was not given due to INR 2. He was admitted for further evaluation and treatment,    SUBJECTIVE (INTERVAL HISTORY) His wife is at the bedside. He remains unchanged and is awaiting rehab transfer   OBJECTIVE Temp:  [97.4 F (36.3 C)-98.9 F (37.2 C)] 98.2 F (36.8 C) (01/09 1328) Pulse Rate:  [77-97] 97 (01/09 1328) Cardiac Rhythm: Normal sinus rhythm;Heart block (01/09 0800) Resp:  [16-20] 20 (01/09 1328) BP: (108-131)/(43-96) 122/73 (01/09 1328) SpO2:  [94 %-97 %] 97 % (01/09 1328)  CBC:   Recent Labs Lab 02/22/16 1306  02/24/16 0857 02/25/16 0510  WBC 13.2*  --  12.5* 13.4*  NEUTROABS 6.3  --   --   --   HGB 14.1  < > 16.3 15.3  HCT 42.5  < > 49.3 47.0  MCV 94.0  --  93.4 94.2  PLT 373  --  434* 405*  < > = values in this interval not displayed.  Basic Metabolic Panel:   Recent Labs Lab 02/24/16 0857 02/25/16 0510  NA 138 138  K 3.8 4.0  CL 103 103  CO2 24 27  GLUCOSE 165* 126*  BUN 17 15  CREATININE 0.89 0.77  CALCIUM 9.3 9.0    Lipid Panel:     Component Value Date/Time   CHOL 179 02/23/2016 0651   TRIG 103 02/23/2016 0651   HDL 59 02/23/2016 0651   CHOLHDL 3.0 02/23/2016 0651   VLDL 21 02/23/2016 0651   LDLCALC 99 02/23/2016 0651   HgbA1c:  Lab Results  Component Value Date   HGBA1C 6.2 (H) 02/23/2016   Urine Drug Screen:     Component Value Date/Time   LABOPIA NONE DETECTED 02/22/2016 1340   COCAINSCRNUR NONE DETECTED 02/22/2016 1340   LABBENZ NONE DETECTED 02/22/2016 1340   AMPHETMU NONE DETECTED 02/22/2016 1340   THCU NONE DETECTED 02/22/2016 1340   LABBARB NONE DETECTED 02/22/2016 1340      IMAGING  Ct Head Code Stroke W/o  Cm 02/22/2016 1. No acute finding.  2. Chronic microvascular disease, moderate.  3. Advanced chronic sinusitis.  4. Left more than right mastoid opacification which has developed since June 2017.   [name] Head Wo Contrast 02/22/2016 7 mm acute infarct right posterior limb internal capsule and right lateral thalamus. Atrophy with chronic microvascular ischemic change.  Negative MRA head  Extensive sinusitis with air-fluid level left maxillary sinus.   Carotid Doppler   There is 1-39% bilateral ICA stenosis. Vertebral artery flow is antegrade.   LE Venous Doppler There is no DVT or SVT noted in the left lower extremity.  2D Echocardiogram  pending    PHYSICAL EXAM Gen: NAD Eyes: anicteric sclerae, moist conjunctivae                    CV: no MRG, no carotid bruits, no peripheral edema Mental Status: Alert, follows commands, good historian Neuro: Detailed Neurologic Exam Speech:    No aphasia, no dysarthria Cranial Nerves:    The pupils are equal, round, and reactive to light.. Attempted, Fundi not visualized.  EOMI. No gaze preference. Visual fields full. Face symmetric, Tongue midline. Hearing intact to voice. Shoulder shrug intact  Motor Observation:    no involuntary movements noted. Tone appears normal.  Strength:   Left arm 2/5, left leg 3-/5.  Sensation:  Intact to LT Plantars downgoing.    ASSESSMENT/PLAN Grant. SANTHIAGO [name] is a 83 y.o. male with history of previous stroke, hyperlipidemia, history of GI bleed, diabetes mellitus, and asthma presenting with new onset left-sided weakness and ataxia (NO HX ATRIAL FIB). He did not receive IV t-PA due to anticoagulation.  Stroke:  Small R PLIC infarct secondary to small vessel disease while on therapeutic coumadin  Resultant  Left hemiplegia  MRI - 7 mm acute infarct right posterior limb internal capsule and right lateral thalamus.  MRA - negative   Carotid Doppler - No significant stenosis  2D Echo -Left ventricle:  The cavity size was normal. Wall thickness was   normal. Systolic function was mildly to moderately reduced. The    estimated ejection fraction was 45%.  Lower extremity venous duplex - no VTE  LDL - 99  HgbA1c - 6.2  VTE prophylaxis - warfarin. Add lovenox once INR drops. Diet regular Room service appropriate? Yes; Fluid consistency: Thin  warfarin daily prior to admission, now on warfarin daily. Long discussion with pt/wife (see above). Dr. Leonie Man also discussed with Dr. Delfina Redwood. Recommend stopping coumadin. Continue aspirin for secondary stroke prevention. Orders written.  Patient counseled to be compliant with his antithrombotic medications  Ongoing aggressive stroke risk factor management  Therapy recommendations: SNF recommended by OT and PT. Will get Rehab referral for inpatient rehab evaluation.  Disposition: pending   Hypertension  Stable  Permissive hypertension (OK if < 220/120) but gradually normalize in 5-7 days  Long-term BP goal normotensive  Hyperlipidemia  Home meds:  Zocor 20 mg daily resumed in hospital  LDL 99, goal < 70  Increased Zocor to 40 mg daily   Continue statin at discharge  Diabetes  HgbA1c 6.2 goal < 7.0  Controlled  Other Stroke Risk Factors  Advanced age  Overweight, Body mass index is 27.29 kg/m., recommend weight loss, diet and exercise as appropriate   Hx stroke/TIA -   07/2015 L occipital infarct. Felt to be embolic. No atrial fibrillation found.  Placed on coumadin as part of RESPECT ESUS.   2015 x 3 episodes with Recurrent slurred speech   2010 Hx slurred speech  Other Active Problems  Mild leukocytosis - 13.2->12.5 - afebrile  Hospital day # [place] for Pager information 02/25/2016 2:53 PM  I have personally examined this patient, reviewed notes, independently viewed imaging studies, participated in medical decision making and plan of care.ROS completed by me  personally and pertinent positives fully documented  I have made any additions or clarifications directly to the above note. Agree with note above. The patient has had multiple recurrent strokes despite being on Plavix, RESPECT ESUS trial medication and more recently warfarin. He has no clear documented atrial fibrillation or other long-term indication for antocoagulation and has had 2 episodes of lower GI hemorrhage hence I  recommend discontinuing warfarin and changing to aspirin 325 mg daily.I had a long discussion with Dr. Reesa Chew as well as patient and his wife and uncertain questions. Greater than 50% time during this 25 minute visit was spent on counseling and coordination of care about his recurrent strokes. Follow-up as an outpatient in the stroke clinic in 6 weeks. Stroke team will sign off. Kindly call for questions. Antony Contras, MD Medical Director [facility]  Pager: [phone] 02/25/2016 2:53 PM   To contact Stroke Continuity provider, please refer to [email or web address] After hours, contact General Neurology

## 2016-02-25 NOTE — Progress Notes (Signed)
Physical Therapy Treatment Patient Details Name: [name] MRN: [id number] DOB: Feb 19, 1931 Today's Date: 02/25/2016    History of Present Illness [name] is an 83 y.o. male who presented to the ED with new onset L sided weakness and ataxia. MRI revealed 19mm acute infart R posterior limb internal capsule and R lateral thalamus. Pt has a PMH significant for acute CVA 07/2015, astgna, cerebral infarctio ndue to embolism of L middle cerebral artery 01/2014, colitis, colon polyps, diabetes mellitus type 2, diverticulosis, GERD, hemerrhoids, hiatal hernia, history of esophageal stricture, hyperlipidemia, osteoarthritis, and proctitis.     PT Comments    Pt progressing towards physical therapy goals. Was able to perform transfers and ambulation with +2 assist for balance support, LLE advancement, and walker management. Pt was able to make some postural corrections with mirror set up in front of him during gait training in hall. Recommended staff use [facility] for transfers to/from [facility] - discussed with NT. Will continue to follow and progress as able per POC.  Follow Up Recommendations  CIR;Supervision/Assistance - 24 hour     Equipment Recommendations  Other (comment) (TBD by next venue of care)    Recommendations for Other Services       Precautions / Restrictions Precautions Precautions: Fall Restrictions Weight Bearing Restrictions: No    Mobility  Bed Mobility               General bed mobility comments: Pt sitting up in recliner upon PT arrival.   Transfers Overall transfer level: Needs assistance Equipment used: Rolling walker (2 wheeled) Transfers: Sit to/from Stand Sit to Stand: Mod assist;+2 physical assistance         General transfer comment: assist to power up and placement of L hand on RW  Ambulation/Gait Ambulation/Gait assistance: Mod assist;+2 physical assistance;+2 safety/equipment Ambulation Distance (Feet): 15 Feet Assistive device: Rolling walker (2  wheeled) Gait Pattern/deviations: Step-to pattern;Decreased stride length;Decreased weight shift to left;Decreased stance time - left;Decreased step length - left Gait velocity: Decreased Gait velocity interpretation: Below normal speed for age/gender General Gait Details: Pt ambulated with +2 assist and chair follow for safety. Pt with increased L lateral lean, and required assist for midline weight shift and L foot advancement. Mirror set up in hallway for visual feedback for positioning.    Stairs            Wheelchair Mobility    Modified Rankin (Stroke Patients Only) Modified Rankin (Stroke Patients Only) Pre-Morbid Rankin Score: No symptoms Modified Rankin: Moderately severe disability     Balance Overall balance assessment: Needs assistance Sitting-balance support: Feet supported;Single extremity supported Sitting balance-Leahy Scale: Fair Sitting balance - Comments: posterior lean Postural control: Posterior lean;Left lateral lean Standing balance support: Bilateral upper extremity supported;During functional activity Standing balance-Leahy Scale: Zero                      Cognition Arousal/Alertness: Awake/alert Behavior During Therapy: WFL for tasks assessed/performed Overall Cognitive Status: Impaired/Different from baseline Area of Impairment: Safety/judgement         Safety/Judgement: Decreased awareness of safety;Decreased awareness of deficits          Exercises      General Comments        Pertinent Vitals/Pain Pain Assessment: No/denies pain    Home Living Family/patient expects to be discharged to:: Assisted living Living Arrangements: Spouse/significant other                  Prior Function  PT Goals (current goals can now be found in the care plan section) Acute Rehab PT Goals PT Goal Formulation: With patient Time For Goal Achievement: 03/01/16 Potential to Achieve Goals: Good Progress towards PT  goals: Progressing toward goals    Frequency    Min 4X/week      PT Plan Discharge plan needs to be updated    Co-evaluation             End of Session Equipment Utilized During Treatment: Gait belt Activity Tolerance: Patient tolerated treatment well Patient left: with call bell/phone within reach;with family/visitor present (On BSC - RN notified)     [id number] PT Time Calculation (min) (ACUTE ONLY): 29 min  Charges:  $Gait Training: 23-37 mins                    G Codes:      Thelma Comp 2016/03/10, 2:47 PM   Rolinda Roan, PT, DPT Acute Rehabilitation Services Pager: [phone]

## 2016-02-26 LAB — PROTIME-INR
INR: 1.29
Prothrombin Time: 16.2 s — ABNORMAL HIGH (ref 11.4–15.2)

## 2016-02-26 LAB — CBC
HCT: 46.1 % (ref 39.0–52.0)
Hemoglobin: 15.1 g/dL (ref 13.0–17.0)
MCH: 30.6 pg (ref 26.0–34.0)
MCHC: 32.8 g/dL (ref 30.0–36.0)
MCV: 93.3 fL (ref 78.0–100.0)
Platelets: 386 K/uL (ref 150–400)
RBC: 4.94 MIL/uL (ref 4.22–5.81)
RDW: 15 % (ref 11.5–15.5)
WBC: 11.6 K/uL — ABNORMAL HIGH (ref 4.0–10.5)

## 2016-02-26 LAB — BASIC METABOLIC PANEL WITH GFR
Anion gap: 8 (ref 5–15)
BUN: 16 mg/dL (ref 6–20)
CO2: 26 mmol/L (ref 22–32)
Calcium: 8.8 mg/dL — ABNORMAL LOW (ref 8.9–10.3)
Chloride: 103 mmol/L (ref 101–111)
Creatinine, Ser: 0.75 mg/dL (ref 0.61–1.24)
GFR calc Af Amer: >60 mL/min
GFR calc non Af Amer: >60 mL/min
Glucose, Bld: 123 mg/dL — ABNORMAL HIGH (ref 65–99)
Potassium: 3.6 mmol/L (ref 3.5–5.1)
Sodium: 137 mmol/L (ref 135–145)

## 2016-02-26 MED ORDER — ROPINIROLE HCL 0.25 MG PO TABS: 0.2500 mg | ORAL_TABLET | Freq: Every day | ORAL | 0 refills | Status: DC

## 2016-02-26 MED ORDER — CARVEDILOL 3.125 MG PO TABS: 3.1250 mg | ORAL_TABLET | Freq: Two times a day (BID) | ORAL | 1 refills | Status: DC

## 2016-02-26 MED ORDER — ROPINIROLE HCL 0.25 MG PO TABS
0.2500 mg | ORAL_TABLET | Freq: Every day | ORAL | Status: DC
  Administered 2016-02-26: 0.25 mg via ORAL
  Filled 2016-02-26: qty 1

## 2016-02-26 MED ORDER — ASPIRIN 325 MG PO TBEC: 325.0000 mg | DELAYED_RELEASE_TABLET | Freq: Every day | ORAL | 1 refills | Status: DC

## 2016-02-26 MED ORDER — SIMVASTATIN 40 MG PO TABS: 40.0000 mg | ORAL_TABLET | Freq: Every day | ORAL | 3 refills | Status: AC

## 2016-02-26 MED ORDER — ZOLPIDEM TARTRATE 5 MG PO TABS: 5.0000 mg | ORAL_TABLET | Freq: Every evening | ORAL | 0 refills | Status: DC | PRN

## 2016-02-26 NOTE — Care Management Note (Signed)
Case Management Note  Patient Details  Name: [name] MRN: [id number] Date of Birth: 1931/11/07  Subjective/Objective:                    Action/Plan: Pt received a denial from [facility] for CIR. Pt and family wanting to go to [facility] rehab. [facility] does not have a bed until tomorrow. Per CSW unable to get authorization to send pt to another facility today. CM informed pt and his family of discharge, but they state they can not take him home. Plan is for pt to d/c to [facility] in am. MD aware. CM following.   Expected Discharge Date:                  Expected Discharge Plan:  Skilled Nursing Facility  In-House Referral:  Clinical Social Work  Discharge planning Services  CM Consult  Post Acute Care Choice:    Choice offered to:     DME Arranged:    DME Agency:     HH Arranged:    [place] Agency:     Status of Service:  In process, will continue to follow  If discussed at Long Length of Stay Meetings, dates discussed:    Additional Comments:  Pollie Friar, RN 02/26/2016, 6:55 PM

## 2016-02-26 NOTE — Discharge Summary (Signed)
Refer to the discharge summary from 02/25/16. It has been accurately updated. Patient awaited overnight pending insurance approval.

## 2016-02-26 NOTE — Progress Notes (Signed)
[employer] has denied approval for admission to inpt rehab today. I have met with pt, spouse and son at bedside and they are aware. SNF is approved. RN CM and SW made aware. We will sign off.  [phone]

## 2016-02-26 NOTE — Progress Notes (Signed)
Physical Therapy Treatment Patient Details Name: [name] MRN: [id number] DOB: 1931-10-30 Today's Date: 02/26/2016    History of Present Illness Mr. Klontz is an 83 y.o. male who presented to the ED with new onset L sided weakness and ataxia. MRI revealed 44mm acute infart R posterior limb internal capsule and R lateral thalamus. Pt has a PMH significant for acute CVA 07/2015, astgna, cerebral infarctio ndue to embolism of L middle cerebral artery 01/2014, colitis, colon polyps, diabetes mellitus type 2, diverticulosis, GERD, hemerrhoids, hiatal hernia, history of esophageal stricture, hyperlipidemia, osteoarthritis, and proctitis.     PT Comments    Pt progressing well towards physical therapy goals. Showing improvement with strength and is requiring less assist with transfers. Mirror is helpful during gait training for improved posture and midline weight shift. Continue to feel this patient is an excellent rehab candidate. Will continue to follow.   Follow Up Recommendations  CIR;Supervision/Assistance - 24 hour     Equipment Recommendations  Other (comment) (TBD by next venue of care)    Recommendations for Other Services Rehab consult     Precautions / Restrictions Precautions Precautions: Fall Restrictions Weight Bearing Restrictions: No    Mobility  Bed Mobility Overal bed mobility: Needs Assistance Bed Mobility: Supine to Sit     Supine to sit: Mod assist     General bed mobility comments: mod assist to raise trunk  Transfers Overall transfer level: Needs assistance Equipment used: Rolling walker (2 wheeled) Transfers: Sit to/from Stand Sit to Stand: +2 physical assistance;Min assist         General transfer comment: assist to power up and placement of L hand on RW  Ambulation/Gait Ambulation/Gait assistance: Mod assist;+2 physical assistance;+2 safety/equipment Ambulation Distance (Feet): 30 Feet (15' x2) Assistive device: Rolling walker (2 wheeled) Gait  Pattern/deviations: Step-to pattern;Decreased stride length;Decreased weight shift to left;Decreased stance time - left;Decreased step length - left Gait velocity: Decreased Gait velocity interpretation: Below normal speed for age/gender General Gait Details: Pt ambulated with +2 assist and chair follow for safety. Pt with increased L lateral lean, and required assist for midline weight shift and L foot advancement. Mirror set up in hallway for visual feedback for positioning. When mirror taken away, L lateral lean increased.    Stairs            Wheelchair Mobility    Modified Rankin (Stroke Patients Only) Modified Rankin (Stroke Patients Only) Pre-Morbid Rankin Score: No symptoms Modified Rankin: Moderately severe disability     Balance Overall balance assessment: Needs assistance Sitting-balance support: Feet supported;Single extremity supported Sitting balance-Leahy Scale: Fair Sitting balance - Comments: posterior lean Postural control: Posterior lean;Left lateral lean Standing balance support: Bilateral upper extremity supported;During functional activity Standing balance-Leahy Scale: Poor                      Cognition Arousal/Alertness: Awake/alert Behavior During Therapy: WFL for tasks assessed/performed Overall Cognitive Status: Within Functional Limits for tasks assessed Area of Impairment: Safety/judgement         Safety/Judgement: Decreased awareness of safety;Decreased awareness of deficits          Exercises      General Comments        Pertinent Vitals/Pain Pain Assessment: No/denies pain    Home Living                      Prior Function  PT Goals (current goals can now be found in the care plan section) Acute Rehab PT Goals Patient Stated Goal: to get rehab so he can get back to golfing PT Goal Formulation: With patient Time For Goal Achievement: 03/01/16 Potential to Achieve Goals: Good Progress towards  PT goals: Progressing toward goals    Frequency    Min 4X/week      PT Plan Discharge plan needs to be updated    Co-evaluation PT/OT/SLP Co-Evaluation/Treatment: Yes Reason for Co-Treatment: To address functional/ADL transfers;For patient/therapist safety PT goals addressed during session: Mobility/safety with mobility;Balance;Proper use of DME OT goals addressed during session: ADL's and self-care     End of Session Equipment Utilized During Treatment: Gait belt Activity Tolerance: Patient tolerated treatment well Patient left: in chair;with call bell/phone within reach;with chair alarm set;with family/visitor present     Time: [id number] PT Time Calculation (min) (ACUTE ONLY): 28 min  Charges:  $Gait Training: 8-22 mins                    G Codes:      Thelma Comp March 07, 2016, 1:45 PM  Rolinda Roan, PT, DPT Acute Rehabilitation Services Pager: [phone]

## 2016-02-26 NOTE — Progress Notes (Signed)
Occupational Therapy Treatment Patient Details Name: [name] MRN: [id number] DOB: 1931-08-20 Today's Date: 02/26/2016    History of present illness Mr. Sonier is an 83 y.o. male who presented to the ED with new onset L sided weakness and ataxia. MRI revealed 68mm acute infart R posterior limb internal capsule and R lateral thalamus. Pt has a PMH significant for acute CVA 07/2015, astgna, cerebral infarctio ndue to embolism of L middle cerebral artery 01/2014, colitis, colon polyps, diabetes mellitus type 2, diverticulosis, GERD, hemerrhoids, hiatal hernia, history of esophageal stricture, hyperlipidemia, osteoarthritis, and proctitis.    OT comments  Pt remains highly motivated to regain his independence and mobility. Improving strength of L UE with gravity eliminated. Continued educating in compensatory strategies for sensation loss in L UE and for ADL. Pt with less L side lean in standing when visually monitoring with mirror.  Follow Up Recommendations  CIR;Supervision/Assistance - 24 hour    Equipment Recommendations  None recommended by OT    Recommendations for Other Services      Precautions / Restrictions Precautions Precautions: Fall Restrictions Weight Bearing Restrictions: No       Mobility Bed Mobility Overal bed mobility: Needs Assistance Bed Mobility: Supine to Sit     Supine to sit: Mod assist     General bed mobility comments: mod assist to raise trunk  Transfers Overall transfer level: Needs assistance Equipment used: Rolling walker (2 wheeled) Transfers: Sit to/from Stand Sit to Stand: +2 physical assistance;Min assist         General transfer comment: assist to power up and placement of L hand on RW    Balance Overall balance assessment: Needs assistance Sitting-balance support: Feet supported;Single extremity supported Sitting balance-Leahy Scale: Fair       Standing balance-Leahy Scale: Poor                     ADL Overall ADL's :  Needs assistance/impaired     Grooming: Oral care;Sitting;Minimal assistance Grooming Details (indicate cue type and reason): incorporated use of L hand to stabilize while setting up toothbrush         Upper Body Dressing : Moderate assistance;Sitting Upper Body Dressing Details (indicate cue type and reason): front opening gown using compensatory strategies     Toilet Transfer: +2 for physical assistance;Stand-pivot;Moderate assistance Toilet Transfer Details (indicate cue type and reason): simulated bed to recliner toward R         Functional mobility during ADLs: +2 for physical assistance;Rolling walker;Moderate assistance;Cueing for safety;Cueing for sequencing General ADL Comments: Performed L UE AAROM all areas x 10 in supine, towel exercises with L UE seated in recliner with support of table.      Vision                     Perception     Praxis      Cognition   Behavior During Therapy: WFL for tasks assessed/performed Overall Cognitive Status: Within Functional Limits for tasks assessed                       Extremity/Trunk Assessment               Exercises     Shoulder Instructions       General Comments      Pertinent Vitals/ Pain       Pain Assessment: No/denies pain  Home Living  Prior Functioning/Environment              Frequency  Min 2X/week        Progress Toward Goals  OT Goals(current goals can now be found in the care plan section)  Progress towards OT goals: Progressing toward goals  Acute Rehab OT Goals Patient Stated Goal: to get rehab so he can get back to golfing Time For Goal Achievement: 03/08/16 Potential to Achieve Goals: Good  Plan Discharge plan remains appropriate    Co-evaluation    PT/OT/SLP Co-Evaluation/Treatment: Yes Reason for Co-Treatment: To address functional/ADL transfers;For patient/therapist safety   OT goals  addressed during session: ADL's and self-care      End of Session Equipment Utilized During Treatment: Gait belt;Rolling walker   Activity Tolerance Patient tolerated treatment well   Patient Left in chair;with call bell/phone within reach;with chair alarm set;with family/visitor present   Nurse Communication          Time: [id number] OT Time Calculation (min): 34 min  Charges: OT General Charges $OT Visit: 1 Procedure OT Treatments $Therapeutic Activity: 8-22 mins  Malka So 02/26/2016, 11:00 AM  [phone]

## 2016-02-26 NOTE — Progress Notes (Signed)
PROGRESS NOTE    [name]  [id number] DOB: 09-30-1931 DOA: 02/22/2016 PCP: Kandice Hams, MD   Brief Summary:  Patient was admitted to the hospital with complaints of left sided Upper extremity weakness and slightly slurrying of the speech. He was found to have 71mm acute infarct in right internal capsule and thalamic area. He was already on Coumadin from his previous strokes with therapeutic INR at the time of the admission. His further work up showed no significant stenosis of Carotids, Prediabetic A1c 6.2, and slightly elevated LDL of 99 therefore his Zocor was increased from 20mg  to 40mg . His Echo showed ef 45% with grade I diastolic Dysfunction without any hx of CHF. He was started on Coreg and I had discussed it with his PCP who will refer him to Cardiology for outpatient work up. Meanwhile it was determined that Coumadin wasn't helping him matter a fact due to recent History of diverticular GI bleed and intermittent hx of bleeds at home, it was best to take him off and place him on ASA 325mg  po daily. This was also discussed with his PCP by Dr Leonie Man (neurologist, stroke team).   Assessment & Plan:   Principal Problem:   Stroke-like symptom Active Problems:   Diabetes mellitus type 2, diet-controlled (HCC)   HLD (hyperlipidemia)   Cough variant asthma   Chronic anticoagulation 2/2 history of CVA   History of CVA (cerebrovascular accident)   History of lower GI bleeding Dec 2017   Leukocytosis   Acute CVA (cerebrovascular accident) ([place])   Diabetes mellitus with complication (HCC)   Benign essential HTN   PAF (paroxysmal atrial fibrillation) (HCC)   Subtherapeutic international normalized ratio (INR)  Acute CVA with right thalamic infarct -continues to have left sided UE weakness -wasn't a tPA candidate at the time of the admission as he was on therapeutic coumadin.  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 Tolerating well. Can add ACEI/ARB as outpatient.  -I have talked to his PCP, Dr Delfina Redwood, who will refer to outpatient Cardiology for further work up and follow up. Apparently heh as seen Dr Oran Rein in the past - cont ASA, Statin at this time. Marland Kitchen     History of lower GI bleeding Dec 2017 -No further recurrence of symptoms since discharge -Hemoglobin stable     Diabetes mellitus type 2, diet-controlled -Current CBG control -A1c: 6.2 at this time. Need to strictly control with diet and exercise.     Chronic anticoagulation 2/2 history of CVA -coumadin discontinued as it did not seem beneficial for his CVA given he had a stroke while being therapeutic INR. So in the setting of hx of recent GI bleed it was deemed appropriate to take him of coumadin considering risk and benefit and he was placed on ASA 325mg  po daily .     HLD (hyperlipidemia) -Continue Zocor    Cough variant asthma -Continue Flonase and Dulera    DVT prophylaxis: on Coumadin  Code Status: DNR Family Communication:  Wife and son at bedside  Disposition Plan: Placement - CIR, still awaiting insurance approval. He can go once approved by his insurance.   Consultants:   Neuro   Procedures:   None   Antimicrobials:   None    Subjective: No complaints this morning. Still continues to have left sided arm weakness but states it little better from yesterday. He is eager to get started with his inpatient rehab.    Objective: Vitals:   02/25/16 1720 02/25/16  2048 02/26/16 0032 02/26/16 0528  BP: (!) 148/80 122/73 125/72 140/73  Pulse: 87 79 78 72  Resp: 20 16 16 16   Temp: 97.7 F (36.5 C) 98.5 F (36.9 C) 98.2 F (36.8 C) 97.7 F (36.5 C)  TempSrc: Oral Oral Oral Oral  SpO2: 97% 95% 93% 92%  Weight:      Height:        Intake/Output Summary (Last 24 hours) at 02/26/16 1251 Last data filed at 02/26/16 0835  Gross per 24 hour  Intake              600 ml  Output             1000 ml  Net             -400 ml   Filed Weights   02/22/16 1319  Weight: 76.7 kg (169 lb 1.5 oz)    Examination:  General exam: Appears calm and comfortable  Respiratory system: Clear to auscultation. Respiratory effort normal. Cardiovascular system: S1 & S2 heard, RRR. No JVD, murmurs, rubs, gallops or clicks. No pedal edema. Gastrointestinal system: Abdomen is nondistended, soft and nontender. No organomegaly or masses felt. Normal bowel sounds heard. Central nervous system: Alert and oriented. RUE 5/5 in strenght, LUE 3+/5 in strength, 4+/5 for B/L Le, CN II-Xii intact.  Extremities: Symmetric 5 x 5 power. Skin: No rashes, lesions or ulcers Psychiatry: Judgement and insight appear normal. Mood & affect appropriate.     Data Reviewed:   CBC:  Recent Labs Lab 02/22/16 1306 02/22/16 1309 02/24/16 0857 02/25/16 0510 02/26/16 0654  WBC 13.2*  --  12.5* 13.4* 11.6*  NEUTROABS 6.3  --   --   --   --   HGB 14.1 15.0 16.3 15.3 15.1  HCT 42.5 44.0 49.3 47.0 46.1  MCV 94.0  --  93.4 94.2 93.3  PLT 373  --  434* 405* [id number]   Basic Metabolic Panel:  Recent Labs Lab 02/22/16 1306 02/22/16 1309 02/24/16 0857 02/25/16 0510 02/26/16 0654  NA 137 138 138 138 137  K 4.0 4.0 3.8 4.0 3.6  CL 104 103 103 103 103  CO2 23  --  24 27 26   GLUCOSE 88 87 165* 126* 123*  BUN 14 15 17 15 16   CREATININE 0.71 0.70 0.89 0.77 0.75  CALCIUM 8.4*  --  9.3 9.0 8.8*   GFR: Estimated Creatinine Clearance: 69.5 mL/min (by C-G formula based on SCr of 0.75  mg/dL). Liver Function Tests:  Recent Labs Lab 02/22/16 1306  AST 20  ALT 14*  ALKPHOS 67  BILITOT 0.5  PROT 6.6  ALBUMIN 3.5   No results for input(s): LIPASE, AMYLASE in the last 168 hours. No results for input(s): AMMONIA in the last 168 hours. Coagulation Profile:  Recent Labs Lab 02/22/16 1306 02/23/16 0651 02/24/16 0857 02/25/16 0510 02/26/16 0654  INR 2.03 2.03 1.96 1.92 1.29   Cardiac Enzymes: No results for input(s): CKTOTAL, CKMB, CKMBINDEX, TROPONINI in the last  168 hours. BNP (last 3 results) No results for input(s): PROBNP in the last 8760 hours. HbA1C: No results for input(s): HGBA1C in the last 72 hours. CBG:  Recent Labs Lab 02/22/16 1304  GLUCAP 84   Lipid Profile: No results for input(s): CHOL, HDL, LDLCALC, TRIG, CHOLHDL, LDLDIRECT in the last 72 hours. Thyroid Function Tests: No results for input(s): TSH, T4TOTAL, FREET4, T3FREE, THYROIDAB in the last 72 hours. Anemia Panel: No results for input(s): VITAMINB12, FOLATE, FERRITIN, TIBC, IRON, RETICCTPCT in the last 72 hours. Sepsis Labs: No results for input(s): PROCALCITON, LATICACIDVEN in the last 168 hours.  No results found for this or any previous visit (from the past 240 hour(s)).       Radiology Studies: No results found.      Scheduled Meds: . aspirin EC  325 mg Oral Daily  . carvedilol  3.125 mg Oral BID WC  . fluticasone  2 spray Each Nare Daily  . mometasone-formoterol  2 puff Inhalation BID  . simvastatin  40 mg Oral Daily   Continuous Infusions:   LOS: 3 days    Time spent: 35 mins     Coraleigh Sheeran Arsenio Loader, MD [facility] Pager [phone]   If 7PM-7AM, please contact night-coverage [email or web address] Password [email or web address] 02/26/2016, 12:51 PM

## 2016-02-27 DIAGNOSIS — R2689 Other abnormalities of gait and mobility: Secondary | ICD-10-CM | POA: Diagnosis not present

## 2016-02-27 DIAGNOSIS — R471 Dysarthria and anarthria: Secondary | ICD-10-CM | POA: Diagnosis not present

## 2016-02-27 DIAGNOSIS — D72829 Elevated white blood cell count, unspecified: Secondary | ICD-10-CM | POA: Diagnosis not present

## 2016-02-27 DIAGNOSIS — R2681 Unsteadiness on feet: Secondary | ICD-10-CM | POA: Diagnosis not present

## 2016-02-27 DIAGNOSIS — G4700 Insomnia, unspecified: Secondary | ICD-10-CM | POA: Diagnosis not present

## 2016-02-27 DIAGNOSIS — J449 Chronic obstructive pulmonary disease, unspecified: Secondary | ICD-10-CM | POA: Diagnosis not present

## 2016-02-27 DIAGNOSIS — M6281 Muscle weakness (generalized): Secondary | ICD-10-CM | POA: Diagnosis not present

## 2016-02-27 DIAGNOSIS — I69354 Hemiplegia and hemiparesis following cerebral infarction affecting left non-dominant side: Secondary | ICD-10-CM | POA: Diagnosis not present

## 2016-02-27 DIAGNOSIS — I110 Hypertensive heart disease with heart failure: Secondary | ICD-10-CM | POA: Diagnosis not present

## 2016-02-27 DIAGNOSIS — R41841 Cognitive communication deficit: Secondary | ICD-10-CM | POA: Diagnosis not present

## 2016-02-27 DIAGNOSIS — E785 Hyperlipidemia, unspecified: Secondary | ICD-10-CM | POA: Diagnosis not present

## 2016-02-27 DIAGNOSIS — E119 Type 2 diabetes mellitus without complications: Secondary | ICD-10-CM | POA: Diagnosis not present

## 2016-02-27 DIAGNOSIS — R531 Weakness: Secondary | ICD-10-CM | POA: Diagnosis not present

## 2016-02-27 DIAGNOSIS — R609 Edema, unspecified: Secondary | ICD-10-CM | POA: Diagnosis not present

## 2016-02-27 DIAGNOSIS — I509 Heart failure, unspecified: Secondary | ICD-10-CM | POA: Diagnosis not present

## 2016-02-27 DIAGNOSIS — R278 Other lack of coordination: Secondary | ICD-10-CM | POA: Diagnosis not present

## 2016-02-27 DIAGNOSIS — D119 Benign neoplasm of major salivary gland, unspecified: Secondary | ICD-10-CM | POA: Diagnosis not present

## 2016-02-27 DIAGNOSIS — I639 Cerebral infarction, unspecified: Secondary | ICD-10-CM | POA: Diagnosis not present

## 2016-02-27 DIAGNOSIS — I6789 Other cerebrovascular disease: Secondary | ICD-10-CM | POA: Diagnosis not present

## 2016-02-27 DIAGNOSIS — D721 Eosinophilia: Secondary | ICD-10-CM | POA: Diagnosis not present

## 2016-02-27 DIAGNOSIS — R05 Cough: Secondary | ICD-10-CM | POA: Diagnosis not present

## 2016-02-27 DIAGNOSIS — I5040 Unspecified combined systolic (congestive) and diastolic (congestive) heart failure: Secondary | ICD-10-CM | POA: Diagnosis not present

## 2016-02-27 LAB — PROTIME-INR
INR: 1.12
Prothrombin Time: 14.4 seconds (ref 11.4–15.2)

## 2016-02-27 NOTE — Clinical Social Work Note (Signed)
Pt is ready for discharge today and will go to [facility]. Facility is ready to admit pt as they have received discharge information. Pt and family are aware and agreeable to discharge plan. RN will call report. [facility] will provide transportation. CSW is signing off as no further needs identified.   Darden Dates, MSW, LCSW  Clinical Social Worker  [phone]

## 2016-02-27 NOTE — Progress Notes (Signed)
Patient is discharged from room 5C16 at this time. Alert and in stable condition. IV site d/c'd as well as tele. Attempt made to give report to receiving nurse at [facility] landing without success. Spoke with Clovis Cao at front desk and left a message for nurse to Microbiologist when available for report. Patient transported out of unit via stretcher by [facility] with wife and belongings at side.

## 2016-02-27 NOTE — Discharge Summary (Signed)
Patient seen by me.  Eating breakfast--- d/c summary updated, plan for d/c to SNF today    Physician Discharge Summary  [name] [id number] DOB: 14-Apr-1931 DOA: 02/22/2016  PCP: Kandice Hams, MD  Admit date: 02/22/2016 Discharge date: 02/27/16  Admitted From: Assisted Living  Disposition: Inpatient Rehab  Recommendations for Outpatient Follow-up:  1. Follow up with PCP in 1-2 weeks 2. Follow up Neurology in 4-6 weeks at [facility]    Equipment/Devices: Per Rehab Team  Discharge Condition: Stable  CODE STATUS: DNR Diet recommendation: Prehospitalization diet.   Brief/Interim Summary Patient was admitted to the hospital with complaints of left sided Upper extremity weakness and slightly slurrying of the speech. He was found to have 75mm acute infarct in right internal capsule and thalamic area. He was already on Coumadin from his previous strokes with therapeutic INR at the time of the admission. His further work up showed no significant stenosis of Carotids, Prediabetic A1c 6.2, and slightly elevated LDL of 99 therefore his Zocor was increased from 20mg  to 40mg . His Echo showed ef 45% with grade I diastolic Dysfunction without any hx of CHF. He was started on Coreg and I had discussed it with his PCP who will refer him to Cardiology for outpatient work up. Meanwhile it was determined that Coumadin wasn't helping him matter a fact due to recent History of diverticular GI bleed and intermittent hx of bleeds at home, it was best to take him off and place him on ASA 325mg  po daily. This was also discussed with his PCP by Dr Leonie Man (neurologist, stroke team).  I have also added Requip 0.25 mg qhs for his bothersome restless leg syndrome that he reported at the time of the discharge.  He was evaluated by the Physical therapy team and it was deemed he would benefit from inpatient rehab.   Discharge Diagnoses:  Principal Problem:   Stroke-like symptom Active Problems:   Diabetes  mellitus type 2, diet-controlled (HCC)   HLD (hyperlipidemia)   Cough variant asthma   Chronic anticoagulation 2/2 history of CVA   History of CVA (cerebrovascular accident)   History of lower GI bleeding Dec 2017   Leukocytosis   Acute CVA (cerebrovascular accident) ([place])   Diabetes mellitus with complication (HCC)   Benign essential HTN   PAF (paroxysmal atrial fibrillation) (HCC)   Subtherapeutic international normalized ratio (INR)  Acute CVA with right thalamic infarct -continues to have left sided UE weakness -wasn't a tPA candidate at the time of the admission as he was on therapeutic coumadin.  -Initial CT head negative -MRI/MRA brain- shows 53mm right thalamic infarct  -Echocardiogram - shows ef 45% with grade I diastolic dysfunction, Carotid duplex done- 1-39% b/l stenosis -Risk factor stratification: A1c results 6.2 - should control with diet and exercise.;  LDL is 99 -zocor increased to 40mg  po daily  -Frequent neurological checks - Coumadin discontinued and placed on ASA 325mg  per neuro   -SNF placement  Combined Systolic and Diastolic CHF, compensated at this time- New Dx -cont current meds, Coreg added now. Tolerating well. Can add ACEI/ARB as outpatient.  -I have talked to his PCP, Dr Delfina Redwood, who will refer to outpatient Cardiology for further work up and follow up. Apparently heh as seen Dr Oran Rein in the past - cont ASA, Statin at this time.  RestLess Leg Syndrome -added Requip 0.25mg  qhs.   History of lower GI bleeding Dec 2017 -No further recurrence of symptoms since discharge -Hemoglobin stable   Diabetes  mellitus type 2, diet-controlled -Current CBG control -A1c: 6.2 at this time. Need to strictly control with diet and exercise.   Chronic anticoagulation 2/2 history of CVA -coumadin discontinued as it did not seem beneficial for his CVA given he had a stroke while being therapeutic INR. So in the setting of hx of recent GI bleed it was deemed  appropriate to take him of coumadin considering risk and benefit and he was placed on ASA 325mg  po daily .  HLD (hyperlipidemia) -Continue Zocor  Cough variant asthma -Continue Flonase and Dulera  Discharge Instructions   Allergies as of 02/27/2016      Reactions   Azithromycin Rash   Possible reaction to Z-Pak      Medication List    STOP taking these medications   warfarin 5 MG tablet Commonly known as:  COUMADIN     TAKE these medications   acetaminophen 500 MG tablet Commonly known as:  TYLENOL Take 1,000 mg by mouth every 6 (six) hours as needed for headache (pain).   aspirin 325 MG EC tablet Take 1 tablet (325 mg total) by mouth daily.   carvedilol 3.125 MG tablet Commonly known as:  COREG Take 1 tablet (3.125 mg total) by mouth 2 (two) times daily with a meal.   fluticasone 50 MCG/ACT nasal spray Commonly known as:  FLONASE Place 2 sprays into both nostrils at bedtime.   hydrocortisone 25 MG suppository Commonly known as:  ANUSOL-HC Place 1 suppository (25 mg total) rectally 2 (two) times daily.   loratadine 10 MG tablet Commonly known as:  CLARITIN Take 10 mg by mouth daily.   mometasone-formoterol 100-5 MCG/ACT Aero Commonly known as:  DULERA Inhale 2 puffs into the lungs 2 (two) times daily.   montelukast 10 MG tablet Commonly known as:  SINGULAIR Take 1 tablet (10 mg total) by mouth at bedtime.   PROAIR HFA 108 (90 Base) MCG/ACT inhaler Generic drug:  albuterol Inhale 2 puffs into the lungs every 6 (six) hours as needed for wheezing or shortness of breath.   rOPINIRole 0.25 MG tablet Commonly known as:  REQUIP Take 1 tablet (0.25 mg total) by mouth at bedtime.   simvastatin 40 MG tablet Commonly known as:  ZOCOR Take 1 tablet (40 mg total) by mouth daily. What changed:  medication strength  how much to take  when to take this   zolpidem 5 MG tablet Commonly known as:  AMBIEN Take 1 tablet (5 mg total) by mouth at bedtime as  needed for sleep.       Contact information for follow-up providers    SETHI,PRAMOD, MD Follow up in 1 month(s).   Specialties:  Neurology, Radiology Contact information: [address] [place] [id number] [place], MD Follow up in 2 week(s).   Specialty:  Internal Medicine Why:  Follow up and also needs Cardiology refer for furhter work up as mentioned on Discharge paperwork.  Contact information: [address] [place] [address] [phone]            Contact information for after-discharge care    Destination    [facility] .   Specialties:  [place], [place] Contact information: [place] [address] [phone]                 Allergies  Allergen Reactions  . Azithromycin Rash  Possible reaction to Z-Pak    Consultations:  Neurology, Dr Leonie Man    Procedures/Studies: Mr [facility] Wo Contrast  Result Date: 02/22/2016 CLINICAL DATA:  Stroke symptoms.  Left-sided weakness slurred speech EXAM: MRI HEAD WITHOUT CONTRAST MRA HEAD WITHOUT CONTRAST TECHNIQUE: Multiplanar, multiecho pulse sequences of the brain and surrounding structures were obtained without intravenous contrast. Angiographic images of the head were obtained using MRA technique without contrast. COMPARISON:  CT head 02/22/2016  . FINDINGS: MRI HEAD FINDINGS Brain: Small focus of restricted diffusion in the posterior limb internal capsule on the right involving the lateral thalamus. This measures approximately 7 mm in diameter and is consistent with acute infarct. No other acute infarct identified. Moderate atrophy. Ventricular enlargement consistent with atrophy. Chronic microvascular ischemic change in the white matter bilaterally. Chronic infarct right external capsule posteriorly. Pituitary not enlarged. Negative for intracranial hemorrhage or  mass. No fluid collection. Vascular: Normal arterial flow voids. Skull and upper cervical spine: C1-2 arthropathy with prominent pannus behind the dens causing mild spinal stenosis. No bony lesion identified. Sinuses/Orbits: Extensive sinusitis with diffuse mucosal thickening throughout the paranasal sinuses. Air-fluid level left maxillary sinus. Extensive mastoid effusion bilaterally. Bilateral lens replacement. No orbital lesion. Other: None MRA HEAD FINDINGS Internal carotid artery widely patent bilaterally. Anterior and middle cerebral arteries patent bilaterally without significant stenosis. Both vertebral arteries patent to the basilar. Left PICA patent. Right PICA not visualized. Basilar widely patent. Superior cerebellar and posterior cerebral arteries patent bilaterally. Negative for aneurysm. IMPRESSION: 7 mm acute infarct right posterior limb internal capsule and right lateral thalamus. Atrophy with chronic microvascular ischemic change. Negative MRA head Extensive sinusitis with air-fluid level left maxillary sinus. Electronically Signed   By: Franchot Gallo M.D.   On: 02/22/2016 17:01   Mr Brain Wo Contrast  Result Date: 02/22/2016 CLINICAL DATA:  Stroke symptoms.  Left-sided weakness slurred speech EXAM: MRI HEAD WITHOUT CONTRAST MRA HEAD WITHOUT CONTRAST TECHNIQUE: Multiplanar, multiecho pulse sequences of the brain and surrounding structures were obtained without intravenous contrast. Angiographic images of the head were obtained using MRA technique without contrast. COMPARISON:  CT head 02/22/2016  . FINDINGS: MRI HEAD FINDINGS Brain: Small focus of restricted diffusion in the posterior limb internal capsule on the right involving the lateral thalamus. This measures approximately 7 mm in diameter and is consistent with acute infarct. No other acute infarct identified. Moderate atrophy. Ventricular enlargement consistent with atrophy. Chronic microvascular ischemic change in the white matter  bilaterally. Chronic infarct right external capsule posteriorly. Pituitary not enlarged. Negative for intracranial hemorrhage or mass. No fluid collection. Vascular: Normal arterial flow voids. Skull and upper cervical spine: C1-2 arthropathy with prominent pannus behind the dens causing mild spinal stenosis. No bony lesion identified. Sinuses/Orbits: Extensive sinusitis with diffuse mucosal thickening throughout the paranasal sinuses. Air-fluid level left maxillary sinus. Extensive mastoid effusion bilaterally. Bilateral lens replacement. No orbital lesion. Other: None MRA HEAD FINDINGS Internal carotid artery widely patent bilaterally. Anterior and middle cerebral arteries patent bilaterally without significant stenosis. Both vertebral arteries patent to the basilar. Left PICA patent. Right PICA not visualized. Basilar widely patent. Superior cerebellar and posterior cerebral arteries patent bilaterally. Negative for aneurysm. IMPRESSION: 7 mm acute infarct right posterior limb internal capsule and right lateral thalamus. Atrophy with chronic microvascular ischemic change. Negative MRA head Extensive sinusitis with air-fluid level left maxillary sinus. Electronically Signed   By: Franchot Gallo M.D.   On: 02/22/2016 17:01   Nm Gi Blood Loss  Result Date: 01/31/2016 CLINICAL DATA:  Bloody stool this morning. EXAM: NUCLEAR MEDICINE GASTROINTESTINAL BLEEDING SCAN TECHNIQUE: Sequential abdominal images were obtained following intravenous administration of Tc-32m labeled red blood cells. RADIOPHARMACEUTICALS:  24.7 mCi Tc-33m in-vitro labeled red cells. COMPARISON:  None. FINDINGS: No abnormal focus of activity is demonstrated in the bowel to suggest a lower GI bleed. Normal physiologic biodistribution of the radiotracer is demonstrated throughout the abdomen. IMPRESSION: No scintigraphic evidence of active gastrointestinal bleeding. Electronically Signed   By: Kathreen Devoid   On: 01/31/2016 12:21   Ct Head Code  Stroke W/o Cm  Result Date: 02/22/2016 CLINICAL DATA:  Code stroke. Left-sided weakness. Decreased coordination with tingling. EXAM: CT HEAD WITHOUT CONTRAST TECHNIQUE: Contiguous axial images were obtained from the base of the skull through the vertex without intravenous contrast. COMPARISON:  07/25/2015 FINDINGS: Brain: No evidence of acute infarction, hemorrhage, hydrocephalus, extra-axial collection or mass lesion/mass effect. Chronic microvascular disease with ischemic gliosis throughout the cerebral white matter. When compared to prior there are remote lacunar infarcts in the anterior limb right internal capsule and posterior right putamen. Vascular: Atherosclerotic calcification.  No hyperdense vessel. Skull: No acute or aggressive finding Sinuses/Orbits: Advanced sinusitis with diffuse mucosal thickening, essentially complete in bilateral ethmoid and frontal sinuses. Bilateral mastoid opacification, left more than right. Other: Text page was sent at the time of interpretation on 02/22/2016 at 1:17 pm to Dr. Roland Rack , who subsequently verbally acknowledged these results. ASPECTS [facility] Stroke Program Early CT Score) - Ganglionic level infarction (caudate, lentiform nuclei, internal capsule, insula, M1-M3 cortex): 7 when accounting for chronic changes. - Supraganglionic infarction (M4-M6 cortex): 3 Total score (0-10 with 10 being normal): 10 IMPRESSION: 1. No acute finding. 2. Chronic microvascular disease, moderate. 3. Advanced chronic sinusitis. 4. Left more than right mastoid opacification which has developed since June 2017. Electronically Signed   By: Monte Fantasia M.D.   On: 02/22/2016 13:33      Subjective:   Discharge Exam: Vitals:   02/27/16 0110 02/27/16 0533  BP: 129/85 124/90  Pulse: 76 73  Resp: 20 20  Temp: 98.5 F (36.9 C) 97.6 F (36.4 C)   Vitals:   02/26/16 1300 02/26/16 2109 02/27/16 0110 02/27/16 0533  BP: 108/82 129/73 129/85 124/90  Pulse: 88 81 76 73   Resp: 18 18 20 20   Temp:  98.4 F (36.9 C) 98.5 F (36.9 C) 97.6 F (36.4 C)  TempSrc: Axillary Oral Oral Oral  SpO2: 100% 94% 93% 96%  Weight:      Height:        General: Pt is alert, awake, not in acute distress      The results of significant diagnostics from this hospitalization (including imaging, microbiology, ancillary and laboratory) are listed below for reference.     Microbiology: No results found for this or any previous visit (from the past 240 hour(s)).   Labs: BNP (last 3 results) No results for input(s): BNP in the last 8760 hours. Basic Metabolic Panel:  Recent Labs Lab 02/22/16 1306 02/22/16 1309 02/24/16 0857 02/25/16 0510 02/26/16 0654  NA 137 138 138 138 137  K 4.0 4.0 3.8 4.0 3.6  CL 104 103 103 103 103  CO2 23  --  24 27 26   GLUCOSE 88 87 165* 126* 123*  BUN 14 15 17 15 16   CREATININE 0.71 0.70 0.89 0.77 0.75  CALCIUM 8.4*  --  9.3 9.0 8.8*   Liver Function Tests:  Recent Labs Lab 02/22/16 1306  AST 20  ALT 14*  ALKPHOS 67  BILITOT 0.5  PROT 6.6  ALBUMIN 3.5   No results for input(s): LIPASE, AMYLASE in the last 168 hours. No results for input(s): AMMONIA in the last 168 hours. CBC:  Recent Labs Lab 02/22/16 1306 02/22/16 1309 02/24/16 0857 02/25/16 0510 02/26/16 0654  WBC 13.2*  --  12.5* 13.4* 11.6*  NEUTROABS 6.3  --   --   --   --   HGB 14.1 15.0 16.3 15.3 15.1  HCT 42.5 44.0 49.3 47.0 46.1  MCV 94.0  --  93.4 94.2 93.3  PLT 373  --  434* 405* 386   Cardiac Enzymes: No results for input(s): CKTOTAL, CKMB, CKMBINDEX, TROPONINI in the last 168 hours. BNP: Invalid input(s): POCBNP CBG:  Recent Labs Lab 02/22/16 1304  GLUCAP 84   D-Dimer No results for input(s): DDIMER in the last 72 hours. Hgb A1c No results for input(s): HGBA1C in the last 72 hours. Lipid Profile No results for input(s): CHOL, HDL, LDLCALC, TRIG, CHOLHDL, LDLDIRECT in the last 72 hours. Thyroid function studies No results for  input(s): TSH, T4TOTAL, T3FREE, THYROIDAB in the last 72 hours.  Invalid input(s): FREET3 Anemia work up No results for input(s): VITAMINB12, FOLATE, FERRITIN, TIBC, IRON, RETICCTPCT in the last 72 hours. Urinalysis    Component Value Date/Time   COLORURINE YELLOW 02/22/2016 1340   APPEARANCEUR CLEAR 02/22/2016 1340   LABSPEC 1.013 02/22/2016 1340   PHURINE 6.0 02/22/2016 1340   GLUCOSEU NEGATIVE 02/22/2016 1340   HGBUR NEGATIVE 02/22/2016 1340   BILIRUBINUR NEGATIVE 02/22/2016 1340   KETONESUR NEGATIVE 02/22/2016 1340   PROTEINUR NEGATIVE 02/22/2016 1340   UROBILINOGEN 0.2 12/22/2013 1210   NITRITE NEGATIVE 02/22/2016 1340   LEUKOCYTESUR NEGATIVE 02/22/2016 1340   Sepsis Labs Invalid input(s): PROCALCITONIN,  WBC,  LACTICIDVEN Microbiology No results found for this or any previous visit (from the past 240 hour(s)).   Time coordinating discharge: Over 30 minutes  SIGNED:   Geradine Girt, DO [facility] 02/27/2016, 8:55 AM Pager   If 7PM-7AM, please contact night-coverage [email or web address] Password [email or web address]

## 2016-02-27 NOTE — Care Management Important Message (Signed)
Important Message  Patient Details  Name: [name] MRN: [id number] Date of Birth: [birth year]   Medicare Important Message Given:  Yes    [name] Montine Circle 02/27/2016, 12:25 PM

## 2016-02-27 NOTE — Clinical Social Work Note (Signed)
Clinical Social Work Assessment  Patient Details  Name: [name] MRN: [id number] Date of Birth: 1931-06-20  Date of referral:  02/26/16               Reason for consult:  Facility Placement, Discharge Planning                Permission sought to share information with:  Family Supports Permission granted to share information::  Yes, Verbal Permission Granted  Name::     Deshay Blumenfeld  Relationship::  wife  Contact Information:  [phone]  Housing/Transportation Living arrangements for the past 2 months:  [place] of Information:  Patient, Adult Children, Spouse Patient Interpreter Needed:  None Criminal Activity/Legal Involvement Pertinent to Current Situation/Hospitalization:  No - Comment as needed Significant Relationships:  Adult Children, Spouse Lives with:  Spouse Do you feel safe going back to the place where you live?  Yes Need for family participation in patient care:  Yes (Comment)  Care giving concerns:  No care giving concerns identified.   Social Worker assessment / plan:  CSW met with pt and and family to address consult for New SNF. CSW introduced herself and explained role of social work. CSW also explained the process of discharging to SNF with [employer]. Pt lives with wife at independent living at [facility]. PT is recommending CIR, however insurance denied.   CSW spoke with [facility], and pt will be able to go in the morning while [facility] is pending as pt is a resident. CSW updated family and RNCM. CSW will continue to follow.   Employment status:  Retired Nurse, adult PT Recommendations:  [place] / Referral to community resources:  [place] ([facility])  Patient/Family's Response to care:  Pt and family were appreciative of CSW support.   Patient/Family's Understanding of and Emotional Response to Diagnosis, Current Treatment, and Prognosis:  Pt and  family are in agreement with STR and understand that pt would benefit for rehab prior to returning home.  Emotional Assessment Appearance:  Appears stated age Attitude/Demeanor/Rapport:   (appropriate) Affect (typically observed):  Accepting, Adaptable, Pleasant Orientation:  Oriented to Self, Oriented to Place, Oriented to  Time, Oriented to Situation Alcohol / Substance use:  Not Applicable Psych involvement (Current and /or in the community):  No (Comment)  Discharge Needs  Concerns to be addressed:  Discharge Planning Concerns Readmission within the last 30 days:  No Current discharge risk:  Chronically ill Barriers to Discharge:  No Barriers Identified   Darden Dates, LCSW 02/27/2016, 11:19 AM

## 2016-02-27 NOTE — Clinical Social Work Placement (Signed)
   CLINICAL SOCIAL WORK PLACEMENT  NOTE  Date:  02/27/2016  Patient Details  Name: [name] MRN: [id number] Date of Birth: 28-Apr-1931  Clinical Social Work is seeking post-discharge placement for this patient at the [place] level of care (*CSW will initial, date and re-position this form in  chart as items are completed):  Yes   Patient/family provided with [place] Work Department's list of facilities offering this level of care within the geographic area requested by the patient (or if unable, by the patient's family).  Yes   Patient/family informed of their freedom to choose among providers that offer the needed level of care, that participate in Medicare, Medicaid or managed care program needed by the patient, have an available bed and are willing to accept the patient.  Yes   Patient/family informed of [facility]'s ownership interest in [facility] and [facility], as well as of the fact that they are under no obligation to receive care at these facilities.  PASRR submitted to EDS on 02/24/16     PASRR number received on 02/24/16     Existing PASRR number confirmed on       FL2 transmitted to all facilities in geographic area requested by pt/family on 02/26/16     FL2 transmitted to all facilities within larger geographic area on       Patient informed that his/her managed care company has contracts with or will negotiate with certain facilities, including the following:        Yes   Patient/family informed of bed offers received.  Patient [facility] bed at [facility]     Physician recommends and patient chooses bed at      Patient to be transferred to [facility] [place] on 02/27/16.  Patient to be transferred to facility by [facility]     Patient family notified on 02/27/16 of transfer.  Name of family member notified:  Pt's son and wife     PHYSICIAN       Additional Comment:     _______________________________________________ Darden Dates, LCSW 02/27/2016, 11:13 AM

## 2016-03-02 DIAGNOSIS — D72829 Elevated white blood cell count, unspecified: Secondary | ICD-10-CM | POA: Diagnosis not present

## 2016-03-03 DIAGNOSIS — I509 Heart failure, unspecified: Secondary | ICD-10-CM | POA: Diagnosis not present

## 2016-03-03 DIAGNOSIS — E119 Type 2 diabetes mellitus without complications: Secondary | ICD-10-CM | POA: Diagnosis not present

## 2016-03-03 DIAGNOSIS — J449 Chronic obstructive pulmonary disease, unspecified: Secondary | ICD-10-CM | POA: Diagnosis not present

## 2016-03-03 DIAGNOSIS — E785 Hyperlipidemia, unspecified: Secondary | ICD-10-CM | POA: Diagnosis not present

## 2016-03-03 DIAGNOSIS — I639 Cerebral infarction, unspecified: Secondary | ICD-10-CM | POA: Diagnosis not present

## 2016-03-17 ENCOUNTER — Ambulatory Visit: Payer: Medicare HMO | Admitting: Gastroenterology

## 2016-03-17 DIAGNOSIS — R609 Edema, unspecified: Secondary | ICD-10-CM | POA: Diagnosis not present

## 2016-03-17 DIAGNOSIS — D72829 Elevated white blood cell count, unspecified: Secondary | ICD-10-CM | POA: Diagnosis not present

## 2016-03-17 DIAGNOSIS — D119 Benign neoplasm of major salivary gland, unspecified: Secondary | ICD-10-CM | POA: Diagnosis not present

## 2016-03-24 DIAGNOSIS — R05 Cough: Secondary | ICD-10-CM | POA: Diagnosis not present

## 2016-03-24 DIAGNOSIS — G4700 Insomnia, unspecified: Secondary | ICD-10-CM | POA: Diagnosis not present

## 2016-03-24 DIAGNOSIS — D721 Eosinophilia: Secondary | ICD-10-CM | POA: Diagnosis not present

## 2016-04-14 DIAGNOSIS — J449 Chronic obstructive pulmonary disease, unspecified: Secondary | ICD-10-CM | POA: Diagnosis not present

## 2016-04-14 DIAGNOSIS — E785 Hyperlipidemia, unspecified: Secondary | ICD-10-CM | POA: Diagnosis not present

## 2016-04-14 DIAGNOSIS — I639 Cerebral infarction, unspecified: Secondary | ICD-10-CM | POA: Diagnosis not present

## 2016-04-14 DIAGNOSIS — I519 Heart disease, unspecified: Secondary | ICD-10-CM | POA: Diagnosis not present

## 2016-04-14 DIAGNOSIS — E119 Type 2 diabetes mellitus without complications: Secondary | ICD-10-CM | POA: Diagnosis not present

## 2016-04-15 DIAGNOSIS — R2689 Other abnormalities of gait and mobility: Secondary | ICD-10-CM | POA: Diagnosis not present

## 2016-04-15 DIAGNOSIS — M62838 Other muscle spasm: Secondary | ICD-10-CM | POA: Diagnosis not present

## 2016-04-15 DIAGNOSIS — M6281 Muscle weakness (generalized): Secondary | ICD-10-CM | POA: Diagnosis not present

## 2016-04-15 DIAGNOSIS — I110 Hypertensive heart disease with heart failure: Secondary | ICD-10-CM | POA: Diagnosis not present

## 2016-04-15 DIAGNOSIS — R278 Other lack of coordination: Secondary | ICD-10-CM | POA: Diagnosis not present

## 2016-04-15 DIAGNOSIS — I69354 Hemiplegia and hemiparesis following cerebral infarction affecting left non-dominant side: Secondary | ICD-10-CM | POA: Diagnosis not present

## 2016-04-16 DIAGNOSIS — R278 Other lack of coordination: Secondary | ICD-10-CM | POA: Diagnosis not present

## 2016-04-16 DIAGNOSIS — I69354 Hemiplegia and hemiparesis following cerebral infarction affecting left non-dominant side: Secondary | ICD-10-CM | POA: Diagnosis not present

## 2016-04-16 DIAGNOSIS — R2689 Other abnormalities of gait and mobility: Secondary | ICD-10-CM | POA: Diagnosis not present

## 2016-04-16 DIAGNOSIS — I110 Hypertensive heart disease with heart failure: Secondary | ICD-10-CM | POA: Diagnosis not present

## 2016-04-16 DIAGNOSIS — R2681 Unsteadiness on feet: Secondary | ICD-10-CM | POA: Diagnosis not present

## 2016-04-16 DIAGNOSIS — M6281 Muscle weakness (generalized): Secondary | ICD-10-CM | POA: Diagnosis not present

## 2016-04-28 DIAGNOSIS — E7800 Pure hypercholesterolemia, unspecified: Secondary | ICD-10-CM | POA: Diagnosis not present

## 2016-04-28 DIAGNOSIS — I69354 Hemiplegia and hemiparesis following cerebral infarction affecting left non-dominant side: Secondary | ICD-10-CM | POA: Diagnosis not present

## 2016-04-28 DIAGNOSIS — M62838 Other muscle spasm: Secondary | ICD-10-CM | POA: Diagnosis not present

## 2016-04-28 DIAGNOSIS — I519 Heart disease, unspecified: Secondary | ICD-10-CM | POA: Diagnosis not present

## 2016-04-28 DIAGNOSIS — R7301 Impaired fasting glucose: Secondary | ICD-10-CM | POA: Diagnosis not present

## 2016-04-28 DIAGNOSIS — I639 Cerebral infarction, unspecified: Secondary | ICD-10-CM | POA: Diagnosis not present

## 2016-04-30 ENCOUNTER — Ambulatory Visit: Payer: Medicare Other | Admitting: Neurology

## 2016-05-04 ENCOUNTER — Emergency Department (HOSPITAL_COMMUNITY): Payer: Medicare HMO

## 2016-05-04 ENCOUNTER — Encounter (HOSPITAL_COMMUNITY): Payer: Self-pay | Admitting: Emergency Medicine

## 2016-05-04 ENCOUNTER — Emergency Department (HOSPITAL_COMMUNITY)
Admission: EM | Admit: 2016-05-04 | Discharge: 2016-05-04 | Disposition: A | Discharge status: Home / Self Care | Payer: Medicare HMO | Attending: Emergency Medicine | Admitting: Emergency Medicine

## 2016-05-04 DIAGNOSIS — I10 Essential (primary) hypertension: Secondary | ICD-10-CM | POA: Diagnosis not present

## 2016-05-04 DIAGNOSIS — Z8673 Personal history of transient ischemic attack (TIA), and cerebral infarction without residual deficits: Secondary | ICD-10-CM | POA: Diagnosis not present

## 2016-05-04 DIAGNOSIS — Z7982 Long term (current) use of aspirin: Secondary | ICD-10-CM | POA: Insufficient documentation

## 2016-05-04 DIAGNOSIS — G459 Transient cerebral ischemic attack, unspecified: Secondary | ICD-10-CM | POA: Insufficient documentation

## 2016-05-04 DIAGNOSIS — E119 Type 2 diabetes mellitus without complications: Secondary | ICD-10-CM | POA: Diagnosis not present

## 2016-05-04 DIAGNOSIS — Z79899 Other long term (current) drug therapy: Secondary | ICD-10-CM | POA: Insufficient documentation

## 2016-05-04 DIAGNOSIS — J45909 Unspecified asthma, uncomplicated: Secondary | ICD-10-CM | POA: Insufficient documentation

## 2016-05-04 DIAGNOSIS — R404 Transient alteration of awareness: Secondary | ICD-10-CM | POA: Diagnosis not present

## 2016-05-04 DIAGNOSIS — R531 Weakness: Secondary | ICD-10-CM | POA: Diagnosis not present

## 2016-05-04 DIAGNOSIS — R410 Disorientation, unspecified: Secondary | ICD-10-CM | POA: Diagnosis not present

## 2016-05-04 DIAGNOSIS — R4701 Aphasia: Secondary | ICD-10-CM | POA: Diagnosis not present

## 2016-05-04 LAB — CBC
HEMATOCRIT: 50.1 % (ref 39.0–52.0)
Hemoglobin: 16.7 g/dL (ref 13.0–17.0)
MCH: 30.6 pg (ref 26.0–34.0)
MCHC: 33.3 g/dL (ref 30.0–36.0)
MCV: 91.9 fL (ref 78.0–100.0)
PLATELETS: 346 10*3/uL (ref 150–400)
RBC: 5.45 MIL/uL (ref 4.22–5.81)
RDW: 14.3 % (ref 11.5–15.5)
WBC: 14.5 10*3/uL — AB (ref 4.0–10.5)

## 2016-05-04 LAB — URINALYSIS, ROUTINE W REFLEX MICROSCOPIC
Bilirubin Urine: NEGATIVE
GLUCOSE, UA: NEGATIVE mg/dL
Hgb urine dipstick: NEGATIVE
Ketones, ur: NEGATIVE mg/dL
LEUKOCYTES UA: NEGATIVE
NITRITE: NEGATIVE
PROTEIN: NEGATIVE mg/dL
Specific Gravity, Urine: 1.011 (ref 1.005–1.030)
pH: 7 (ref 5.0–8.0)

## 2016-05-04 LAB — COMPREHENSIVE METABOLIC PANEL
ALT: 13 U/L — ABNORMAL LOW (ref 17–63)
ANION GAP: 10 (ref 5–15)
AST: 19 U/L (ref 15–41)
Albumin: 3.7 g/dL (ref 3.5–5.0)
Alkaline Phosphatase: 74 U/L (ref 38–126)
BUN: 10 mg/dL (ref 6–20)
CHLORIDE: 104 mmol/L (ref 101–111)
CO2: 23 mmol/L (ref 22–32)
CREATININE: 0.67 mg/dL (ref 0.61–1.24)
Calcium: 9 mg/dL (ref 8.9–10.3)
GFR calc non Af Amer: >60 mL/min (ref >60)
Glucose, Bld: 121 mg/dL — ABNORMAL HIGH (ref 65–99)
POTASSIUM: 3.7 mmol/L (ref 3.5–5.1)
SODIUM: 137 mmol/L (ref 135–145)
TOTAL PROTEIN: 6.8 g/dL (ref 6.5–8.1)
Total Bilirubin: 0.6 mg/dL (ref 0.3–1.2)

## 2016-05-04 NOTE — ED Notes (Signed)
Family at bedside. 

## 2016-05-04 NOTE — ED Notes (Signed)
Placed patient on the monitir did ekg shown to dr linker

## 2016-05-04 NOTE — ED Provider Notes (Signed)
Pt signed out to me by F Espina PA-C. Pt presents with TIA symptoms earlier this morning which have resolved. MRI pending at shift change.  MRI show no acute infarct. Will d/c with close PCP follow up. Return precautions given.   Recardo Evangelist, PA-C 05/05/16 0031    Gwenyth Allegra Tegeler, MD 05/06/16 1141

## 2016-05-04 NOTE — ED Triage Notes (Signed)
Pt arrives via [facility] from home where he lives with wife, pts wife states pt woke up with slurred speech this am,hx of CVA 02/22/16 that left pt with some residual left side weakness. EMS reports slurred speech subsided pta to ED. Pt now speaking in clear sentences, a/o.

## 2016-05-04 NOTE — ED Provider Notes (Signed)
[facility] DEPT Provider Note   CSN: [id number] Arrival date & time: 05/04/16  1012     History   Chief Complaint Chief Complaint  Patient presents with  . Aphasia    HPI [name] is a 83 y.o. male with PMHx of CVA (07/25/2015, 02/23/16), TIA (12/22/13), asthma, PAF, HTN, left sided weakness arriving today via EMS from home with reports of slurred speech starting this morning. Patient reports having difficulty thinking and confusion at the same of his slurred speech. Patient states that his symptoms resolved once EMS arrived. Patient denies any pain at this time. He reports his symptoms are similar to his previous TIA's.  He denies blood thinners stating that it was discontinued after having his previous stroke in January of this year. He does admit to aspirin use. He reports complete resolution of his symptoms. He denies LOC, trauma, injury, chest pain, numbness, tingling. He denies fever, chills, nausea, vomiting, diarrhea. He admits to wheezing, shortness of breath, and cough secondary to a recent "cold -like" symptoms he has had. He states he currently is undergoing physical, occupation, speech therapy from previous stroke and has improved significantly.   Patient's wife also states patient woke up with slurred speech and confusion. He had trouble remembering the names of his children. She states he is currently at his baseline.   EMS reported slurred speech subsided prior to arrival to ED, and now speaking in clear sentences.   The history is provided by the patient, medical records and the spouse. No language interpreter was used.    Past Medical History:  Diagnosis Date  . Acute CVA (cerebrovascular accident) ([place]) 07/25/2015  . Asthma   . Benign essential HTN   . Cerebral infarction due to embolism of left middle cerebral artery ([place]) 02/03/2014  . Colitis   . Colon polyps   . Diabetes mellitus type 2, diet-controlled ([place]) 12/22/2013  . Diverticulosis   . GERD  (gastroesophageal reflux disease)   . Hemorrhoids   . Hiatal hernia   . History of esophageal strciture   . HLD (hyperlipidemia)   . Osteoarthritis   . Proctitis   . Status post dilation of esophageal narrowing   . Stroke [facility])     Patient Active Problem List   Diagnosis Date Noted  . Diabetes mellitus with complication ([place])   . Benign essential HTN   . PAF (paroxysmal atrial fibrillation) ([place])   . Subtherapeutic international normalized ratio (INR)   . Acute CVA (cerebrovascular accident) ([place]) 02/23/2016  . Stroke-like symptom 02/22/2016  . Leukocytosis 02/22/2016  . History of lower GI bleeding Dec 2017 02/01/2016  . Chronic anticoagulation 2/2 history of CVA 01/31/2016  . History of CVA (cerebrovascular accident) 01/31/2016  . Sinusitis, chronic 12/12/2015  . Cough variant asthma 10/31/2015  . Insomnia 07/25/2015  . HLD (hyperlipidemia) 02/03/2014  . Ulnar neuropathy at elbow of right upper extremity 02/03/2014  . Cervical radiculopathy 02/03/2014  . Seizures ([place])   . Diabetes mellitus type 2, diet-controlled ([place]) 12/22/2013  . Recurrent ventral hernia 12/15/2011  . History of esophageal strciture     Past Surgical History:  Procedure Laterality Date  . ABDOMINAL HERNIA REPAIR    . esophageal hernia    . INSERTION OF MESH  01/29/2012   Procedure: INSERTION OF MESH;  Surgeon: Gwenyth Ober, MD;  Location: [facility];  Service: General;  Laterality: N/A;  . nissen    . SPLENECTOMY, TOTAL     nontraumatic rupture  . VENTRAL HERNIA  REPAIR  01/29/2012    WITH MESH  . VENTRAL HERNIA REPAIR  01/29/2012   Procedure: HERNIA REPAIR VENTRAL ADULT;  Surgeon: Gwenyth Ober, MD;  Location: [facility];  Service: General;  Laterality: N/A;  open recurrent ventral hernia repair with mesh       Home Medications    Prior to Admission medications   Medication Sig Start Date End Date Taking? Authorizing Provider  acetaminophen (TYLENOL) 500 MG tablet Take 1,000 mg by mouth every 6  (six) hours as needed for headache (pain).   Yes Historical Provider, MD  albuterol (PROAIR HFA) 108 (90 Base) MCG/ACT inhaler Inhale 2 puffs into the lungs every 6 (six) hours as needed for wheezing or shortness of breath.   Yes Historical Provider, MD  aspirin EC 325 MG EC tablet Take 1 tablet (325 mg total) by mouth daily. 02/27/16  Yes Ankit Arsenio Loader, MD  baclofen (LIORESAL) 10 MG tablet Take 10 mg by mouth at bedtime.   Yes Historical Provider, MD  carvedilol (COREG) 3.125 MG tablet Take 1 tablet (3.125 mg total) by mouth 2 (two) times daily with a meal. 02/26/16  Yes Ankit Arsenio Loader, MD  cetirizine (ZYRTEC) 10 MG tablet Take 10 mg by mouth daily.   Yes Historical Provider, MD  fluticasone (FLONASE) 50 MCG/ACT nasal spray Place 2 sprays into both nostrils at bedtime.  06/18/14  Yes Historical Provider, MD  guaiFENesin (MUCINEX) 600 MG 12 hr tablet Take 600 mg by mouth at bedtime.   Yes Historical Provider, MD  mometasone-formoterol (DULERA) 100-5 MCG/ACT AERO Inhale 2 puffs into the lungs 2 (two) times daily. 12/12/15  Yes Tanda Rockers, MD  montelukast (SINGULAIR) 10 MG tablet Take 1 tablet (10 mg total) by mouth at bedtime. 10/31/15  Yes Tanda Rockers, MD  rOPINIRole (REQUIP) 0.25 MG tablet Take 1 tablet (0.25 mg total) by mouth at bedtime. 02/26/16  Yes Ankit Arsenio Loader, MD  simvastatin (ZOCOR) 40 MG tablet Take 1 tablet (40 mg total) by mouth daily. 02/26/16  Yes Ankit Arsenio Loader, MD  zolpidem (AMBIEN) 5 MG tablet Take 1 tablet (5 mg total) by mouth at bedtime as needed for sleep. 02/26/16  Yes Ankit Arsenio Loader, MD  hydrocortisone (ANUSOL-HC) 25 MG suppository Place 1 suppository (25 mg total) rectally 2 (two) times daily. Patient not taking: Reported on 02/22/2016 02/01/16   Venetia Maxon Rama, MD    Family History Family History  Problem Relation Age of Onset  . Cancer Father     Social History Social History  Substance Use Topics  . Smoking status: Never Smoker  . Smokeless  tobacco: Never Used  . Alcohol use 0.6 oz/week    1 Glasses of wine per week     Comment: daily     Allergies   Azithromycin   Review of Systems Review of Systems  Constitutional: Negative for chills and fever.  Eyes: Negative for photophobia and visual disturbance.  Respiratory: Positive for cough, shortness of breath and wheezing.   Cardiovascular: Negative for chest pain.  Gastrointestinal: Negative for abdominal pain, diarrhea, nausea and vomiting.  Genitourinary: Negative for difficulty urinating and dysuria.  Neurological: Positive for speech difficulty.  Psychiatric/Behavioral: Positive for confusion.  All other systems reviewed and are negative.    Physical Exam Updated Vital Signs BP (!) 141/77 (BP Location: Right Arm)   Pulse 85   Temp 97.7 F (36.5 C) (Oral)   Resp 19   SpO2 92%   Physical Exam  Constitutional:  He is oriented to person, place, and time. He appears well-developed and well-nourished. No distress.  Well appearing  HENT:  Head: Normocephalic and atraumatic.  Nose: Nose normal.  Mouth/Throat: Oropharynx is clear and moist.  Eyes: Conjunctivae and EOM are normal. Pupils are equal, round, and reactive to light.  Neck: Normal range of motion.  Cardiovascular: Normal rate, normal heart sounds and intact distal pulses.   No murmur heard. Pulmonary/Chest: Effort normal. No respiratory distress. He has wheezes.  Normal work of breathing. No respiratory distress noted.   Abdominal: Soft. There is no tenderness. There is no rebound and no guarding.  Musculoskeletal: Normal range of motion.  Patient has left foot drop brace placed.   Neurological: He is alert and oriented to person, place, and time.  Cranial Nerves:  III,IV, VI: ptosis not present, extra-ocular movements intact bilaterally, direct and consensual pupillary light reflexes intact bilaterally V: facial sensation, jaw opening, and bite strength equal bilaterally VII: eyebrow raise, eyelid  close, smile, frown, pucker equal bilaterally VIII: hearing grossly normal bilaterally  IX,X: palate elevation and swallowing intact XI: bilateral shoulder shrug and lateral head rotation equal and strong XII: midline tongue extension  Negative pronator drift, negative Romberg, negative RAM's, negative heel-to-shin, negative finger to nose.    Sensory intact.  Muscle strength 5/5. Minimal noticeable left sided weakness. Pt and wife state is baseline from previous stroke.  Patient able to stand with assistance. Patient normally uses a walker to ambulate.   Skin: Skin is warm.  Psychiatric: He has a normal mood and affect. His behavior is normal.  Nursing note and vitals reviewed.    ED Treatments / Results  Labs (all labs ordered are listed, but only abnormal results are displayed) Labs Reviewed  COMPREHENSIVE METABOLIC PANEL - Abnormal; Notable for the following:       Result Value   Glucose, Bld 121 (*)    ALT 13 (*)    All other components within normal limits  CBC - Abnormal; Notable for the following:    WBC 14.5 (*)    All other components within normal limits  URINALYSIS, ROUTINE W REFLEX MICROSCOPIC    EKG  EKG Interpretation  Date/Time:  Monday May 04 2016 10:44:59 EDT Ventricular Rate:  85 PR Interval:    QRS Duration: 91 QT Interval:  377 QTC Calculation: 449 R Axis:   -64 Text Interpretation:  Sinus rhythm Left anterior fasicular block PVCS otherwise no signfiicant changes from prior ekgs Confirmed by [facility]  MD, MARTHA [phone]) on 05/04/2016 10:49:34 AM       Radiology Dg Chest 2 View  Result Date: 05/04/2016 CLINICAL DATA:  Slurred speech, confusion starting this morning, history of CVA EXAM: CHEST  2 VIEW COMPARISON:  10/31/2015 FINDINGS: Cardiomediastinal silhouette is stable. No infiltrate or pleural effusion. No pulmonary edema. Stable streaky atelectasis or scarring left base. Mild thoracic spine osteopenia. Postsurgical changes are noted upper  abdomen. IMPRESSION: No active cardiopulmonary disease. Electronically Signed   By: Lahoma Crocker M.D.   On: 05/04/2016 11:51   Ct Head Wo Contrast  Result Date: 05/04/2016 CLINICAL DATA:  Confusion, speech difficulty this morning EXAM: CT HEAD WITHOUT CONTRAST TECHNIQUE: Contiguous axial images were obtained from the base of the skull through the vertex without intravenous contrast. COMPARISON:  02/22/2016 FINDINGS: Brain: No intracranial hemorrhage, mass effect or midline shift. Stable cerebral atrophy. Stable periventricular and patchy subcortical chronic white matter disease. No definite acute cortical infarction. Stable small lacunar infarct in right external capsule.  Tiny lacunar infarct in right posterior limb of internal capsule. Tiny lacunar infarct in left caudate nucleus. No mass lesion is noted on this unenhanced scan. Vascular: Atherosclerotic calcifications of carotid siphon again noted. Skull: No skull fracture is noted. Sinuses/Orbits: There is mild mucosal thickening and probable mucous retention cyst in right maxillary sinus. The mucous retention cyst axial image 3 right posterior aspect right maxillary sinus measures 1 cm. There is mucosal thickening with partial opacification bilateral ethmoid air cells. Other: None IMPRESSION: No acute intracranial abnormality.Stable cerebral atrophy. Stable periventricular and patchy subcortical chronic white matter disease. No definite acute cortical infarction. Stable small lacunar infarct in right external capsule. Tiny lacunar infarct in right posterior limb of internal capsule. Tiny lacunar infarct in left caudate nucleus. Paranasal sinuses disease right maxillary sinus and bilateral ethmoid air cells. Electronically Signed   By: Lahoma Crocker M.D.   On: 05/04/2016 13:23    Procedures Procedures (including critical care time)  Medications Ordered in ED Medications - No data to display   Initial Impression / Assessment and Plan / ED Course  I have  reviewed the triage vital signs and the nursing notes.  Pertinent labs & imaging results that were available during my care of the patient were reviewed by me and considered in my medical decision making (see chart for details).    Patient here with symptoms of concerning for TIA. Patient's history consists of slurred speech and confusion that lasted for about 40 minutes this morning and has resolved since then. Patient is well appearing here in ED. He is in no apparent distress, afebrile, hemodynamically stable. He and his wife both state he is at his baseline. Heart sounds clear. No murmurs auscultated. Lung sounds positive for wheezing. Patient does have a history of asthma. Patient has nonspecific leukocytosis. Lab work is otherwise reassuring. CT is negative for any acute finding. Chest x-ray is negative for any acute findings. EKG shows no acute findings.    Awaiting MRI result.  Patient case discussed with Dr. Canary Brim who agreed with assessment and plan.  At shift change care was transferred to Janetta Hora, PA-C who will follow pending studies, re-evaulate and determine disposition.  If no acute finding on MRI I anticipate discharge since he recently had a stroke workup in January. If any changes and positive result and then consult neurology and anticipate admission.   Final Clinical Impressions(s) / ED Diagnoses   Final diagnoses:  None    New Prescriptions New Prescriptions   No medications on file     [place], [place] 05/04/16 [place], [place] 05/04/16 [place], MD 05/05/16 [phone]

## 2016-05-04 NOTE — ED Notes (Signed)
Patient transported to CT 

## 2016-05-04 NOTE — ED Notes (Signed)
Patient transported to X-ray 

## 2016-05-04 NOTE — Discharge Instructions (Signed)
Please follow up with your family doctor regarding cough - continue allergy medicine Return for worsening symptoms

## 2016-05-11 DIAGNOSIS — G459 Transient cerebral ischemic attack, unspecified: Secondary | ICD-10-CM | POA: Diagnosis not present

## 2016-05-18 DIAGNOSIS — M6281 Muscle weakness (generalized): Secondary | ICD-10-CM | POA: Diagnosis not present

## 2016-05-18 DIAGNOSIS — I69354 Hemiplegia and hemiparesis following cerebral infarction affecting left non-dominant side: Secondary | ICD-10-CM | POA: Diagnosis not present

## 2016-05-18 DIAGNOSIS — I110 Hypertensive heart disease with heart failure: Secondary | ICD-10-CM | POA: Diagnosis not present

## 2016-05-18 DIAGNOSIS — R2689 Other abnormalities of gait and mobility: Secondary | ICD-10-CM | POA: Diagnosis not present

## 2016-05-18 DIAGNOSIS — R278 Other lack of coordination: Secondary | ICD-10-CM | POA: Diagnosis not present

## 2016-05-18 DIAGNOSIS — R2681 Unsteadiness on feet: Secondary | ICD-10-CM | POA: Diagnosis not present

## 2016-06-10 DIAGNOSIS — Z961 Presence of intraocular lens: Secondary | ICD-10-CM | POA: Diagnosis not present

## 2016-06-10 DIAGNOSIS — H01025 Squamous blepharitis left lower eyelid: Secondary | ICD-10-CM | POA: Diagnosis not present

## 2016-06-10 DIAGNOSIS — I63411 Cerebral infarction due to embolism of right middle cerebral artery: Secondary | ICD-10-CM | POA: Diagnosis not present

## 2016-06-10 DIAGNOSIS — H0015 Chalazion left lower eyelid: Secondary | ICD-10-CM | POA: Diagnosis not present

## 2016-06-10 DIAGNOSIS — H01024 Squamous blepharitis left upper eyelid: Secondary | ICD-10-CM | POA: Diagnosis not present

## 2016-06-10 DIAGNOSIS — H01021 Squamous blepharitis right upper eyelid: Secondary | ICD-10-CM | POA: Diagnosis not present

## 2016-06-10 DIAGNOSIS — H01022 Squamous blepharitis right lower eyelid: Secondary | ICD-10-CM | POA: Diagnosis not present

## 2016-06-12 ENCOUNTER — Telehealth: Payer: Self-pay | Admitting: Neurology

## 2016-06-12 NOTE — Telephone Encounter (Signed)
RESPECT ESUS END OF STUDY VISIT The patient was seen on 06/11/16 for end of study visit. He was advised to continue on aspirin and maintain aggressive risk factor modification. He was advised to return for follow-up visit in the neurology clinic in 6 months or call earlier if necessary.  Antony Contras, MD

## 2016-06-15 DIAGNOSIS — M25562 Pain in left knee: Secondary | ICD-10-CM | POA: Diagnosis not present

## 2016-06-16 DIAGNOSIS — I69354 Hemiplegia and hemiparesis following cerebral infarction affecting left non-dominant side: Secondary | ICD-10-CM | POA: Diagnosis not present

## 2016-06-16 DIAGNOSIS — M6281 Muscle weakness (generalized): Secondary | ICD-10-CM | POA: Diagnosis not present

## 2016-06-16 DIAGNOSIS — R278 Other lack of coordination: Secondary | ICD-10-CM | POA: Diagnosis not present

## 2016-06-16 DIAGNOSIS — R2681 Unsteadiness on feet: Secondary | ICD-10-CM | POA: Diagnosis not present

## 2016-06-17 DIAGNOSIS — I69354 Hemiplegia and hemiparesis following cerebral infarction affecting left non-dominant side: Secondary | ICD-10-CM | POA: Diagnosis not present

## 2016-06-17 DIAGNOSIS — R278 Other lack of coordination: Secondary | ICD-10-CM | POA: Diagnosis not present

## 2016-06-17 DIAGNOSIS — M6281 Muscle weakness (generalized): Secondary | ICD-10-CM | POA: Diagnosis not present

## 2016-06-17 DIAGNOSIS — R2681 Unsteadiness on feet: Secondary | ICD-10-CM | POA: Diagnosis not present

## 2016-06-19 DIAGNOSIS — M6281 Muscle weakness (generalized): Secondary | ICD-10-CM | POA: Diagnosis not present

## 2016-06-19 DIAGNOSIS — R278 Other lack of coordination: Secondary | ICD-10-CM | POA: Diagnosis not present

## 2016-06-19 DIAGNOSIS — R2681 Unsteadiness on feet: Secondary | ICD-10-CM | POA: Diagnosis not present

## 2016-06-19 DIAGNOSIS — I69354 Hemiplegia and hemiparesis following cerebral infarction affecting left non-dominant side: Secondary | ICD-10-CM | POA: Diagnosis not present

## 2016-06-23 DIAGNOSIS — I69354 Hemiplegia and hemiparesis following cerebral infarction affecting left non-dominant side: Secondary | ICD-10-CM | POA: Diagnosis not present

## 2016-06-23 DIAGNOSIS — M6281 Muscle weakness (generalized): Secondary | ICD-10-CM | POA: Diagnosis not present

## 2016-06-23 DIAGNOSIS — R278 Other lack of coordination: Secondary | ICD-10-CM | POA: Diagnosis not present

## 2016-06-23 DIAGNOSIS — R2681 Unsteadiness on feet: Secondary | ICD-10-CM | POA: Diagnosis not present

## 2016-06-25 ENCOUNTER — Telehealth: Payer: Self-pay | Admitting: Gastroenterology

## 2016-06-25 DIAGNOSIS — R278 Other lack of coordination: Secondary | ICD-10-CM | POA: Diagnosis not present

## 2016-06-25 DIAGNOSIS — R2681 Unsteadiness on feet: Secondary | ICD-10-CM | POA: Diagnosis not present

## 2016-06-25 DIAGNOSIS — M6281 Muscle weakness (generalized): Secondary | ICD-10-CM | POA: Diagnosis not present

## 2016-06-25 DIAGNOSIS — K625 Hemorrhage of anus and rectum: Secondary | ICD-10-CM

## 2016-06-25 DIAGNOSIS — I69354 Hemiplegia and hemiparesis following cerebral infarction affecting left non-dominant side: Secondary | ICD-10-CM | POA: Diagnosis not present

## 2016-06-25 NOTE — Telephone Encounter (Signed)
Patient reports that he had a very large bleed yesterday in [employer], then again when he got home.  Blood was dark. He refused to go to the ED yesterday. [place] lives in a retirement community and they came and assessed him yesterday and his vitals were good and he was not orthostatic.  Today he says feels well and the nurse came and assessed him again today.  He denies any signs of dizziness, CP, SOB, or lightheaded with position changes.  He has had some gas this am, and sees a pink tinge on the paper when he wipes.  His wife would like him to be checked out, but he doesn't think it is necessary.  He is advised that if he has any signs of bleeding again he will need to proceed to the ED immediately.  He does have a history of GI bleed x 2 in the past.  He had CVA in January and is maintained on a 325 mg ASA, he is not on any other anti-coagulation at this time.

## 2016-06-26 ENCOUNTER — Other Ambulatory Visit (INDEPENDENT_AMBULATORY_CARE_PROVIDER_SITE_OTHER): Payer: Medicare HMO

## 2016-06-26 DIAGNOSIS — R2681 Unsteadiness on feet: Secondary | ICD-10-CM | POA: Diagnosis not present

## 2016-06-26 DIAGNOSIS — M6281 Muscle weakness (generalized): Secondary | ICD-10-CM | POA: Diagnosis not present

## 2016-06-26 DIAGNOSIS — I69354 Hemiplegia and hemiparesis following cerebral infarction affecting left non-dominant side: Secondary | ICD-10-CM | POA: Diagnosis not present

## 2016-06-26 DIAGNOSIS — K625 Hemorrhage of anus and rectum: Secondary | ICD-10-CM

## 2016-06-26 DIAGNOSIS — R278 Other lack of coordination: Secondary | ICD-10-CM | POA: Diagnosis not present

## 2016-06-26 LAB — CBC WITH DIFFERENTIAL/PLATELET
BASOS ABS: 0.1 10*3/uL (ref 0.0–0.1)
Basophils Relative: 1.2 % (ref 0.0–3.0)
EOS ABS: 1.3 10*3/uL — AB (ref 0.0–0.7)
EOS PCT: 10.4 % — AB (ref 0.0–5.0)
HCT: 43.6 % (ref 39.0–52.0)
HEMOGLOBIN: 14.3 g/dL (ref 13.0–17.0)
Lymphocytes Relative: 25.3 % (ref 12.0–46.0)
Lymphs Abs: 3.2 10*3/uL (ref 0.7–4.0)
MCHC: 32.8 g/dL (ref 30.0–36.0)
MCV: 90 fl (ref 78.0–100.0)
MONO ABS: 1.4 10*3/uL — AB (ref 0.1–1.0)
Monocytes Relative: 11 % (ref 3.0–12.0)
NEUTROS PCT: 52.1 % (ref 43.0–77.0)
Neutro Abs: 6.5 10*3/uL (ref 1.4–7.7)
Platelets: 347 10*3/uL (ref 150.0–400.0)
RBC: 4.84 Mil/uL (ref 4.22–5.81)
RDW: 15 % (ref 11.5–15.5)
WBC: 12.6 10*3/uL — AB (ref 4.0–10.5)

## 2016-06-26 NOTE — Telephone Encounter (Signed)
Can you have him come in for a cbc today

## 2016-06-26 NOTE — Telephone Encounter (Signed)
Discussed with Mr. Schalk.  He will come in this afternoon for the labs

## 2016-06-26 NOTE — Telephone Encounter (Signed)
Patient would like the blood to be drawn at [facility].  I left a message for the nurse at [facility] so I can send over the orders.

## 2016-06-26 NOTE — Telephone Encounter (Signed)
Left message for patient to call back  

## 2016-06-30 DIAGNOSIS — R278 Other lack of coordination: Secondary | ICD-10-CM | POA: Diagnosis not present

## 2016-06-30 DIAGNOSIS — M6281 Muscle weakness (generalized): Secondary | ICD-10-CM | POA: Diagnosis not present

## 2016-06-30 DIAGNOSIS — I69354 Hemiplegia and hemiparesis following cerebral infarction affecting left non-dominant side: Secondary | ICD-10-CM | POA: Diagnosis not present

## 2016-06-30 DIAGNOSIS — R2681 Unsteadiness on feet: Secondary | ICD-10-CM | POA: Diagnosis not present

## 2016-07-02 DIAGNOSIS — M6281 Muscle weakness (generalized): Secondary | ICD-10-CM | POA: Diagnosis not present

## 2016-07-02 DIAGNOSIS — R278 Other lack of coordination: Secondary | ICD-10-CM | POA: Diagnosis not present

## 2016-07-02 DIAGNOSIS — R2681 Unsteadiness on feet: Secondary | ICD-10-CM | POA: Diagnosis not present

## 2016-07-02 DIAGNOSIS — I69354 Hemiplegia and hemiparesis following cerebral infarction affecting left non-dominant side: Secondary | ICD-10-CM | POA: Diagnosis not present

## 2016-07-08 DIAGNOSIS — M6281 Muscle weakness (generalized): Secondary | ICD-10-CM | POA: Diagnosis not present

## 2016-07-08 DIAGNOSIS — R2681 Unsteadiness on feet: Secondary | ICD-10-CM | POA: Diagnosis not present

## 2016-07-08 DIAGNOSIS — R278 Other lack of coordination: Secondary | ICD-10-CM | POA: Diagnosis not present

## 2016-07-08 DIAGNOSIS — I69354 Hemiplegia and hemiparesis following cerebral infarction affecting left non-dominant side: Secondary | ICD-10-CM | POA: Diagnosis not present

## 2016-07-10 DIAGNOSIS — R05 Cough: Secondary | ICD-10-CM | POA: Diagnosis not present

## 2016-07-10 DIAGNOSIS — K222 Esophageal obstruction: Secondary | ICD-10-CM | POA: Diagnosis not present

## 2016-07-10 DIAGNOSIS — J4520 Mild intermittent asthma, uncomplicated: Secondary | ICD-10-CM | POA: Diagnosis not present

## 2016-07-14 DIAGNOSIS — R278 Other lack of coordination: Secondary | ICD-10-CM | POA: Diagnosis not present

## 2016-07-14 DIAGNOSIS — M6281 Muscle weakness (generalized): Secondary | ICD-10-CM | POA: Diagnosis not present

## 2016-07-14 DIAGNOSIS — I69354 Hemiplegia and hemiparesis following cerebral infarction affecting left non-dominant side: Secondary | ICD-10-CM | POA: Diagnosis not present

## 2016-07-14 DIAGNOSIS — R2681 Unsteadiness on feet: Secondary | ICD-10-CM | POA: Diagnosis not present

## 2016-07-15 DIAGNOSIS — M25562 Pain in left knee: Secondary | ICD-10-CM | POA: Diagnosis not present

## 2016-07-15 DIAGNOSIS — M1712 Unilateral primary osteoarthritis, left knee: Secondary | ICD-10-CM | POA: Diagnosis not present

## 2016-07-16 DIAGNOSIS — I69354 Hemiplegia and hemiparesis following cerebral infarction affecting left non-dominant side: Secondary | ICD-10-CM | POA: Diagnosis not present

## 2016-07-16 DIAGNOSIS — M6281 Muscle weakness (generalized): Secondary | ICD-10-CM | POA: Diagnosis not present

## 2016-07-16 DIAGNOSIS — R278 Other lack of coordination: Secondary | ICD-10-CM | POA: Diagnosis not present

## 2016-07-16 DIAGNOSIS — R2681 Unsteadiness on feet: Secondary | ICD-10-CM | POA: Diagnosis not present

## 2016-07-20 ENCOUNTER — Telehealth: Payer: Self-pay | Admitting: Neurology

## 2016-07-20 NOTE — Telephone Encounter (Signed)
Dr.Sethi see note. Pt did not seek hospital. But he did not seek the hospital. Please advise thanks.I can call the patient back. Thanks

## 2016-07-20 NOTE — Telephone Encounter (Signed)
Patient called office in reference to having a TIA at [facility] on Saturday. There was a nurse with patient at the time of the TIA.  Per patient TIA cleared up shortly after 20 minutes Nurse advised patient to call our office to see if patient needs to be seen sooner than October with Dr. Leonie Man.

## 2016-07-20 NOTE — Telephone Encounter (Signed)
Okay to see nurse practitioner at the earliest available

## 2016-07-20 NOTE — Telephone Encounter (Signed)
Rn spoke with patient about seeing Grant Sauer NP per Dr.Sethi for possible TIA on Wednesday. PT made appt and will check in at 1000 for 1015 appt.

## 2016-07-20 NOTE — Telephone Encounter (Signed)
Rn call patient about a episode that happen Saturday at [facility]. The nurse told him to call to see if he needed a sooner appt. PT stated he was reading, and it became fuzzy. His right hand became numb,and he had some difficulty speaking. PT stated the nurse was at his side during the episode, and it lasted 20 minutes. Rn ask pt was a doctor present and told him it was a TIA. Pt stated "no I have had TIA before, and think that's what it was". Rn stated a message will be sent to [place] for review. Pt has appt in 11/2016 with Dr.Sethi,and wants to know if he needs a sooner appt.

## 2016-07-21 DIAGNOSIS — I69354 Hemiplegia and hemiparesis following cerebral infarction affecting left non-dominant side: Secondary | ICD-10-CM | POA: Diagnosis not present

## 2016-07-21 DIAGNOSIS — M6281 Muscle weakness (generalized): Secondary | ICD-10-CM | POA: Diagnosis not present

## 2016-07-21 DIAGNOSIS — R278 Other lack of coordination: Secondary | ICD-10-CM | POA: Diagnosis not present

## 2016-07-21 DIAGNOSIS — R2681 Unsteadiness on feet: Secondary | ICD-10-CM | POA: Diagnosis not present

## 2016-07-22 ENCOUNTER — Ambulatory Visit (INDEPENDENT_AMBULATORY_CARE_PROVIDER_SITE_OTHER): Payer: Medicare HMO | Admitting: Nurse Practitioner

## 2016-07-22 ENCOUNTER — Encounter (INDEPENDENT_AMBULATORY_CARE_PROVIDER_SITE_OTHER): Payer: Self-pay

## 2016-07-22 ENCOUNTER — Encounter: Payer: Self-pay | Admitting: Nurse Practitioner

## 2016-07-22 VITALS — BP 149/78 | HR 74 | Ht 66.0 in | Wt 157.0 lb

## 2016-07-22 DIAGNOSIS — G459 Transient cerebral ischemic attack, unspecified: Secondary | ICD-10-CM | POA: Insufficient documentation

## 2016-07-22 DIAGNOSIS — I10 Essential (primary) hypertension: Secondary | ICD-10-CM | POA: Diagnosis not present

## 2016-07-22 DIAGNOSIS — E785 Hyperlipidemia, unspecified: Secondary | ICD-10-CM | POA: Diagnosis not present

## 2016-07-22 DIAGNOSIS — G40919 Epilepsy, unspecified, intractable, without status epilepticus: Secondary | ICD-10-CM | POA: Insufficient documentation

## 2016-07-22 MED ORDER — CLOPIDOGREL BISULFATE 75 MG PO TABS: 75.0000 mg | ORAL_TABLET | Freq: Every day | ORAL | 6 refills | Status: DC

## 2016-07-22 NOTE — Patient Instructions (Addendum)
Stressed the importance of management of risk factors to prevent further stroke Change aspirin to Plavix for secondary stroke prevention Maintain strict control of hypertension with blood pressure goal below 130/90, today's reading 149/78 continue antihypertensive medications Control of diabetes with hemoglobin A1c below 6.5 followed by primary care  Cholesterol with LDL cholesterol less than 70, followed by primary care,  Continue Zocor Will set for for carotid doppler and transcranial doppler Continue PT Exercise by walking, slowly increase , eat healthy diet with whole grains,  fresh fruits and vegetables F/U in 3 months

## 2016-07-22 NOTE — Progress Notes (Signed)
[facility]  PATIENT: [name] DOB: 12/07/1931   REASON FOR VISIT: Follow-up for possible TIA last weekend and slurred speech right hand numbness for 30 minutes HISTORY FROM:    HISTORY OF PRESENT ILLNESS:UPDATE 07/22/2016.CM [name], 83 year old male returns for follow-up with possible TIA event over the weekend where he had slurred speech and right hand numbness for 30 minutes. He is currently on aspirin 325 mg daily. He is on Zocor for hyperlipidemia and is due to have labs next week his primary care. Blood pressure in the office today 149/78 he is continuing to get physical therapy 2 times a week for left lower extremity weakness since admission for stroke and 02/22/2016 . Carotid Doppler at that time 1-39% bilateral ICA stenosis negative MRA of the head MRI with right lateral thalamus and right posterior limb internal capsule infarct. He had another admission to the ER on 05/04/2016 for aphasia/ TIA. MRI without acute infarct. Marland Kitchen He lives in independent living at [name]. He returns for reevaluation   HISTORY 07/2015 [name] had acute onset of dizziness on 07/25/15 and was admitted to [name]. MRI showed tiny acute infarct in the superior left occipital lobe. CTA head and neck unremarkable. EF 45-50%, A1C 6.5, LDL not checked. His investigational meds were discontinued. He was put on plavix. Further embolic work up deferred as he would like to be discharged the 2nd day.   Discussed with pt and his wife that he had recurrent episodes of word finding difficulty. MRI on 12/22/13 showed acute infarct at left temporal cortical region. He was enrolled to RESPECT ESUS trial to compare ASA vs. pradaxa in 04/2014. However, he had another TIA episode with word finding difficulty and MRI negative. He was managed to be remained in the trial. This time his symptoms are different from previous, but MRI showed acute tiny infarct at left occipital lobe. He had 30 day cardiac monitoring  in the past with [name] was told negative for afib. During to recurrent episodes and embolic pattern, we can either put him on coumadin and INR 2-3 or continue plavix and check TEE and consider loop recorder. Pros and cons of either approach have been discussed with pt and wife. Pt more leaning towards coumadin and wife leaning toward the other. They would like to further discuss with PCP [name] and let me know. I will forward the note to [name] today and they will call him next week.    REVIEW OF SYSTEMS: Full 14 system review of systems performed and notable only for those listed, all others are neg:  Constitutional: neg  Cardiovascular: neg Ear/Nose/Throat: neg  Skin: neg Eyes: neg Respiratory: neg Gastroitestinal: neg  Hematology/Lymphatic: neg  Endocrine: neg Musculoskeletal: Weakness, walking difficulty Allergy/Immunology: neg Neurological: neg Psychiatric: neg Sleep : neg   ALLERGIES: Allergies  Allergen Reactions  . Azithromycin Rash    Possible reaction to Z-Pak    HOME MEDICATIONS: Outpatient Medications Prior to Visit  Medication Sig Dispense Refill  . acetaminophen (TYLENOL) 500 MG tablet Take 1,000 mg by mouth every 6 (six) hours as needed for headache (pain).    Marland Kitchen albuterol (PROAIR HFA) 108 (90 Base) MCG/ACT inhaler Inhale 2 puffs into the lungs every 6 (six) hours as needed for wheezing or shortness of breath.    Marland Kitchen aspirin EC 325 MG EC tablet Take 1 tablet (325 mg total) by mouth daily. 90 tablet 1  . baclofen (LIORESAL) 10 MG tablet Take 10 mg by  mouth at bedtime.    . carvedilol (COREG) 3.125 MG tablet Take 1 tablet (3.125 mg total) by mouth 2 (two) times daily with a meal. 60 tablet 1  . cetirizine (ZYRTEC) 10 MG tablet Take 10 mg by mouth daily.    . fluticasone (FLONASE) 50 MCG/ACT nasal spray Place 2 sprays into both nostrils at bedtime.   5  . guaiFENesin (MUCINEX) 600 MG 12 hr tablet Take 600 mg by mouth at bedtime.    . mometasone-formoterol  (DULERA) 100-5 MCG/ACT AERO Inhale 2 puffs into the lungs 2 (two) times daily. 1 Inhaler 0  . montelukast (SINGULAIR) 10 MG tablet Take 1 tablet (10 mg total) by mouth at bedtime. 30 tablet 11  . rOPINIRole (REQUIP) 0.25 MG tablet Take 1 tablet (0.25 mg total) by mouth at bedtime. 30 tablet 0  . simvastatin (ZOCOR) 40 MG tablet Take 1 tablet (40 mg total) by mouth daily. 30 tablet 3  . zolpidem (AMBIEN) 5 MG tablet Take 1 tablet (5 mg total) by mouth at bedtime as needed for sleep. 10 tablet 0  . hydrocortisone (ANUSOL-HC) 25 MG suppository Place 1 suppository (25 mg total) rectally 2 (two) times daily. (Patient not taking: Reported on 07/22/2016) 12 suppository 0   No facility-administered medications prior to visit.     PAST MEDICAL HISTORY: Past Medical History:  Diagnosis Date  . Acute CVA (cerebrovascular accident) ([place]) 07/25/2015  . Asthma   . Benign essential HTN   . Cerebral infarction due to embolism of left middle cerebral artery ([place]) 02/03/2014  . Colitis   . Colon polyps   . Diabetes mellitus type 2, diet-controlled ([place]) 12/22/2013  . Diverticulosis   . GERD (gastroesophageal reflux disease)   . Hemorrhoids   . Hiatal hernia   . History of esophageal strciture   . HLD (hyperlipidemia)   . Osteoarthritis   . Proctitis   . Status post dilation of esophageal narrowing   . Stroke [facility])     PAST SURGICAL HISTORY: Past Surgical History:  Procedure Laterality Date  . ABDOMINAL HERNIA REPAIR    . esophageal hernia    . INSERTION OF MESH  01/29/2012   Procedure: INSERTION OF MESH;  Surgeon: Gwenyth Ober, MD;  Location: [facility];  Service: General;  Laterality: N/A;  . nissen    . SPLENECTOMY, TOTAL     nontraumatic rupture  . VENTRAL HERNIA REPAIR  01/29/2012    WITH MESH  . VENTRAL HERNIA REPAIR  01/29/2012   Procedure: HERNIA REPAIR VENTRAL ADULT;  Surgeon: Gwenyth Ober, MD;  Location: [place];  Service: General;  Laterality: N/A;  open recurrent ventral hernia repair  with mesh    FAMILY HISTORY: Family History  Problem Relation Age of Onset  . Cancer Father     SOCIAL HISTORY: Social History   Social History  . Marital status: Married    Spouse name: [name]  . Number of children: 2  . Years of education: Bachelor's   Occupational History  . retired    Social History Main Topics  . Smoking status: Never Smoker  . Smokeless tobacco: Never Used  . Alcohol use 0.6 oz/week    1 Glasses of wine per week     Comment: daily  . Drug use: No  . Sexual activity: Not on file   Other Topics Concern  . Not on file   Social History Narrative   Patient is married and has 2 children.   Patient is right handed.  Patient has Bachelor's degree.   Patient drinks 2 cups daily.     PHYSICAL EXAM  Vitals:   07/22/16 1021  BP: (!) 149/78  Pulse: 74  Weight: 157 lb (71.2 kg)  Height: 5\' 6"  (1.676 m)   Body mass index is 25.34 kg/m.  Generalized: Well developed, in no acute distress  Head: normocephalic and atraumatic,. Oropharynx benign  Neck: Supple, no carotid bruits  Cardiac: Regular rate rhythm, no murmur  Musculoskeletal: No deformity   Neurological examination   Mentation: Alert oriented to time, place, history taking. Attention span and concentration appropriate. Recent and remote memory intact.  Follows all commands speech and language fluent.   Cranial nerve II-XII: Fundoscopic exam not donePupils were equal round reactive to light extraocular movements were full, visual field were full on confrontational test. Facial sensation and strength were normal. hearing was intact to finger rubbing bilaterally. Uvula tongue midline. head turning and shoulder shrug were normal and symmetric.Tongue protrusion into cheek strength was normal. Motor: normal bulk and tone, full strength in the BUE, BLE, on the right, 3-5  left lower extremity with foot drop. AFO in place , 4 out of 5 left upper extremity Sensory: normal and symmetric to light  touch, in the upper and lower extremities Coordination: finger-nose-finger,  no dysmetria Reflexes: Symmetric upper and lower, plantar responses were flexor bilaterally. Gait and Station: Rising up from seated position with push off. Ambulated a short distance in the hall slow steady gait no difficulty with turns  DIAGNOSTIC DATA (LABS, IMAGING, TESTING) - I reviewed patient records, labs, notes, testing and imaging myself where available.  Lab Results  Component Value Date   WBC 12.6 (H) 06/26/2016   HGB 14.3 06/26/2016   HCT 43.6 06/26/2016   MCV 90.0 06/26/2016   PLT 347.0 06/26/2016      Component Value Date/Time   NA 137 05/04/2016 1117   K 3.7 05/04/2016 1117   CL 104 05/04/2016 1117   CO2 23 05/04/2016 1117   GLUCOSE 121 (H) 05/04/2016 1117   BUN 10 05/04/2016 1117   CREATININE 0.67 05/04/2016 1117   CREATININE 0.74 02/08/2012 1209   CALCIUM 9.0 05/04/2016 1117   PROT 6.8 05/04/2016 1117   ALBUMIN 3.7 05/04/2016 1117   AST 19 05/04/2016 1117   ALT 13 (L) 05/04/2016 1117   ALKPHOS 74 05/04/2016 1117   BILITOT 0.6 05/04/2016 1117   GFRNONAA >60 05/04/2016 1117   GFRAA >60 05/04/2016 1117   Lab Results  Component Value Date   CHOL 179 02/23/2016   HDL 59 02/23/2016   LDLCALC 99 02/23/2016   TRIG 103 02/23/2016   CHOLHDL 3.0 02/23/2016   Lab Results  Component Value Date   HGBA1C 6.2 (H) 02/23/2016     ASSESSMENT AND PLAN  83 y.o. year old male  has a past medical history of Acute CVA (cerebrovascular accident) ([place]) (07/25/2015);  Benign essential HTN; Cerebral infarction due to embolism of left middle cerebral artery ([facility]) (02/03/2014);  HLD (hyperlipidemia); Osteoarthritis;  and Stroke ([place]). here To follow-up for recent TIA last weekend and slurred speech right hand numbness for 30 minutes    Discussed with Dr. Leonie Man Stressed the importance of management of risk factors to prevent further stroke Change aspirin to Plavix for secondary stroke  prevention Maintain strict control of hypertension with blood pressure goal below 130/90, today's reading 149/78 continue antihypertensive medications Control of diabetes with hemoglobin A1c below 6.5 followed by primary care to be checked next week Cholesterol with  LDL cholesterol less than 70, followed by primary care,  Continue Zocor Will set for for carotid doppler and transcranial doppler [facility] Continue PT 2x week ,Exercise by walking, slowly increase eat healthy diet with whole grains,  fresh fruits and vegetables F/U in 3 months Discussed risk for recurrent stroke/ TIA and answered additional questions This was a prolonged visit requiring 45 minutes and medical decision making of high complexity with extensive review of history, hospital chart,  counseling and answering questions Dennie Bible, [facility], [facility], APRN  [name] [address], [place], [place] [address] [phone]

## 2016-07-23 NOTE — Progress Notes (Signed)
I agree with the above plan 

## 2016-07-27 DIAGNOSIS — I519 Heart disease, unspecified: Secondary | ICD-10-CM | POA: Diagnosis not present

## 2016-07-27 DIAGNOSIS — Z7984 Long term (current) use of oral hypoglycemic drugs: Secondary | ICD-10-CM | POA: Diagnosis not present

## 2016-07-27 DIAGNOSIS — G459 Transient cerebral ischemic attack, unspecified: Secondary | ICD-10-CM | POA: Diagnosis not present

## 2016-07-27 DIAGNOSIS — Z9181 History of falling: Secondary | ICD-10-CM | POA: Diagnosis not present

## 2016-07-27 DIAGNOSIS — E119 Type 2 diabetes mellitus without complications: Secondary | ICD-10-CM | POA: Diagnosis not present

## 2016-07-27 DIAGNOSIS — E7800 Pure hypercholesterolemia, unspecified: Secondary | ICD-10-CM | POA: Diagnosis not present

## 2016-08-05 DIAGNOSIS — R2689 Other abnormalities of gait and mobility: Secondary | ICD-10-CM | POA: Diagnosis not present

## 2016-08-05 DIAGNOSIS — R278 Other lack of coordination: Secondary | ICD-10-CM | POA: Diagnosis not present

## 2016-08-05 DIAGNOSIS — I679 Cerebrovascular disease, unspecified: Secondary | ICD-10-CM | POA: Diagnosis not present

## 2016-08-05 DIAGNOSIS — I69354 Hemiplegia and hemiparesis following cerebral infarction affecting left non-dominant side: Secondary | ICD-10-CM | POA: Diagnosis not present

## 2016-08-06 DIAGNOSIS — J4520 Mild intermittent asthma, uncomplicated: Secondary | ICD-10-CM | POA: Diagnosis not present

## 2016-08-06 DIAGNOSIS — R69 Illness, unspecified: Secondary | ICD-10-CM | POA: Diagnosis not present

## 2016-08-06 DIAGNOSIS — K219 Gastro-esophageal reflux disease without esophagitis: Secondary | ICD-10-CM | POA: Diagnosis not present

## 2016-08-07 ENCOUNTER — Telehealth: Payer: Self-pay | Admitting: Internal Medicine

## 2016-08-07 NOTE — Telephone Encounter (Signed)
Called [employer] to obtain PA information PA # [phone] ID# [facility] PA for Dulera 100 (2 puff BID) Pt has tried and failed 2 of the preferred drugs Approved Non-Formulary through 02/15/2017  Pt aware that this medication has been approved through the end of the year.  Nothing further needed.

## 2016-08-12 ENCOUNTER — Ambulatory Visit (HOSPITAL_BASED_OUTPATIENT_CLINIC_OR_DEPARTMENT_OTHER)
Admission: RE | Admit: 2016-08-12 | Discharge: 2016-08-12 | Disposition: A | Discharge status: Home / Self Care | Payer: Medicare HMO | Source: Ambulatory Visit | Attending: Nurse Practitioner | Admitting: Nurse Practitioner

## 2016-08-12 ENCOUNTER — Ambulatory Visit (HOSPITAL_COMMUNITY)
Admission: RE | Admit: 2016-08-12 | Discharge: 2016-08-12 | Disposition: A | Discharge status: Home / Self Care | Payer: Medicare HMO | Source: Ambulatory Visit | Attending: Nurse Practitioner | Admitting: Nurse Practitioner

## 2016-08-12 DIAGNOSIS — G459 Transient cerebral ischemic attack, unspecified: Secondary | ICD-10-CM

## 2016-08-12 DIAGNOSIS — I6523 Occlusion and stenosis of bilateral carotid arteries: Secondary | ICD-10-CM | POA: Insufficient documentation

## 2016-08-12 DIAGNOSIS — E785 Hyperlipidemia, unspecified: Secondary | ICD-10-CM | POA: Insufficient documentation

## 2016-08-12 DIAGNOSIS — I10 Essential (primary) hypertension: Secondary | ICD-10-CM

## 2016-08-12 NOTE — Progress Notes (Signed)
VASCULAR LAB PRELIMINARY  PRELIMINARY  PRELIMINARY  PRELIMINARY  Carotid duplex completed.    Preliminary report:  Bilateral:  1-39% ICA stenosis.  Vertebral artery flow is antegrade.     [name], RVS 08/12/2016, 3:39 PM

## 2016-08-12 NOTE — Progress Notes (Signed)
VASCULAR LAB PRELIMINARY  PRELIMINARY  PRELIMINARY  PRELIMINARY  Transcranial Doppler completed.    Preliminary report:  TCD completed  Hinata Diener, RVS 08/12/2016, 3:41 PM

## 2016-08-13 LAB — VAS US CAROTID
LCCADSYS: -60 cm/s
LCCAPDIAS: 20 cm/s
LEFT ECA DIAS: -12 cm/s
LEFT VERTEBRAL DIAS: -8 cm/s
LICADDIAS: -22 cm/s
LICAPSYS: -60 cm/s
Left CCA dist dias: -16 cm/s
Left CCA prox sys: 108 cm/s
Left ICA dist sys: -61 cm/s
Left ICA prox dias: -19 cm/s
RCCAPDIAS: 9 cm/s
RCCAPSYS: 77 cm/s
RIGHT ECA DIAS: -17 cm/s
RIGHT VERTEBRAL DIAS: 9 cm/s
Right cca dist sys: -51 cm/s

## 2016-08-14 ENCOUNTER — Telehealth: Payer: Self-pay | Admitting: *Deleted

## 2016-08-14 DIAGNOSIS — R69 Illness, unspecified: Secondary | ICD-10-CM | POA: Diagnosis not present

## 2016-08-14 NOTE — Telephone Encounter (Signed)
Spoke to wife and relayed that carotid doppler study normal.  TCD was technically difficult study low waveform throughout.  I relayed due to openings in bone hard to get good readings, but relayed that would have Dr. Leonie Man or CM/NP to call him on Monday to clariify the TCD results.  She verbalized understanding.

## 2016-08-14 NOTE — Telephone Encounter (Signed)
-----   Message from Dennie Bible, NP sent at 08/13/2016  2:51 PM EDT ----- Technically difficult study low waveforms throughout. Carotid study was normal

## 2016-08-17 DIAGNOSIS — R278 Other lack of coordination: Secondary | ICD-10-CM | POA: Diagnosis not present

## 2016-08-17 DIAGNOSIS — I679 Cerebrovascular disease, unspecified: Secondary | ICD-10-CM | POA: Diagnosis not present

## 2016-08-17 DIAGNOSIS — I69354 Hemiplegia and hemiparesis following cerebral infarction affecting left non-dominant side: Secondary | ICD-10-CM | POA: Diagnosis not present

## 2016-08-17 DIAGNOSIS — R2689 Other abnormalities of gait and mobility: Secondary | ICD-10-CM | POA: Diagnosis not present

## 2016-08-17 NOTE — Telephone Encounter (Signed)
Decreased waveform was due to poor windows and technical difficulty. Nothing to worry about. No need to repeat

## 2016-08-17 NOTE — Telephone Encounter (Signed)
Discussed with Dr. Leonie Man. Decreased wave form but no stenosis, does not need to be repeated. Please call the patient.Marland Kitchen

## 2016-08-17 NOTE — Telephone Encounter (Signed)
Spoke to pt and relayed that due to poor windows (way the skull made) showed low waveform.  Per Dr. Leonie Man nothing to worry about,no not need to repeat.  Pt verbalized understanding.

## 2016-08-26 DIAGNOSIS — R6883 Chills (without fever): Secondary | ICD-10-CM | POA: Diagnosis not present

## 2016-08-26 DIAGNOSIS — Z0189 Encounter for other specified special examinations: Secondary | ICD-10-CM | POA: Diagnosis not present

## 2016-08-26 DIAGNOSIS — R5383 Other fatigue: Secondary | ICD-10-CM | POA: Diagnosis not present

## 2016-08-26 DIAGNOSIS — R6889 Other general symptoms and signs: Secondary | ICD-10-CM | POA: Diagnosis not present

## 2016-08-26 DIAGNOSIS — D72829 Elevated white blood cell count, unspecified: Secondary | ICD-10-CM | POA: Diagnosis not present

## 2016-09-22 DIAGNOSIS — J4540 Moderate persistent asthma, uncomplicated: Secondary | ICD-10-CM | POA: Diagnosis not present

## 2016-09-22 DIAGNOSIS — I10 Essential (primary) hypertension: Secondary | ICD-10-CM | POA: Diagnosis not present

## 2016-09-22 DIAGNOSIS — I6789 Other cerebrovascular disease: Secondary | ICD-10-CM | POA: Diagnosis not present

## 2016-09-22 DIAGNOSIS — J45901 Unspecified asthma with (acute) exacerbation: Secondary | ICD-10-CM | POA: Diagnosis not present

## 2016-09-22 DIAGNOSIS — J4541 Moderate persistent asthma with (acute) exacerbation: Secondary | ICD-10-CM | POA: Diagnosis not present

## 2016-09-22 DIAGNOSIS — E119 Type 2 diabetes mellitus without complications: Secondary | ICD-10-CM | POA: Diagnosis not present

## 2016-09-22 DIAGNOSIS — J45909 Unspecified asthma, uncomplicated: Secondary | ICD-10-CM | POA: Diagnosis not present

## 2016-09-22 DIAGNOSIS — J4520 Mild intermittent asthma, uncomplicated: Secondary | ICD-10-CM | POA: Diagnosis not present

## 2016-09-22 DIAGNOSIS — G459 Transient cerebral ischemic attack, unspecified: Secondary | ICD-10-CM | POA: Diagnosis not present

## 2016-09-22 DIAGNOSIS — I639 Cerebral infarction, unspecified: Secondary | ICD-10-CM | POA: Diagnosis not present

## 2016-10-20 ENCOUNTER — Telehealth: Payer: Self-pay | Admitting: Neurology

## 2016-10-20 NOTE — Telephone Encounter (Signed)
Patient called office in reference to having a slight TIA last night lasting 10-15 minutes.  Patients wife was with him when it had happened they called EMS checked over patient and he made the decision to not go to the hospital.  Juluis Rainier

## 2016-10-20 NOTE — Telephone Encounter (Signed)
Dr. Leonie Man this is a FYI thanks. Pt did not seek ED for this episode.

## 2016-10-22 NOTE — Telephone Encounter (Signed)
Left vm on patient home number about Dr. Leonie Man recommendations. Request him to call back.

## 2016-10-22 NOTE — Telephone Encounter (Signed)
I reviewed the patient's chart. I recommend he add back aspirin 81 mg to the Plavix for 3 more weeks and then discontinue aspirin and stay on Plavix alone. If he has recurrent or new symptoms advised him to seek help right away

## 2016-10-22 NOTE — Telephone Encounter (Signed)
Left vm on patients cell phone to call back.

## 2016-10-23 NOTE — Telephone Encounter (Signed)
Spoke to pt and relayed that the instruction to add aspirin 81mg  take along with plavix for 3 wks, then stop aspirin but continue with plavix thereafter.  See  ED care right away if new sx or recurrent sx.  Pt verbalized understanding.

## 2016-10-23 NOTE — Telephone Encounter (Signed)
Patient called office returning Rn's call from yesterday.  Please call

## 2016-10-24 ENCOUNTER — Encounter (HOSPITAL_COMMUNITY): Payer: Self-pay

## 2016-10-24 ENCOUNTER — Emergency Department (HOSPITAL_COMMUNITY): Payer: MEDICARE

## 2016-10-24 ENCOUNTER — Inpatient Hospital Stay (HOSPITAL_COMMUNITY)
Admission: EM | Admit: 2016-10-24 | Discharge: 2016-10-25 | DRG: 378 | Disposition: A | Discharge status: Home / Self Care | Payer: MEDICARE | Attending: Internal Medicine | Admitting: Internal Medicine

## 2016-10-24 DIAGNOSIS — G459 Transient cerebral ischemic attack, unspecified: Secondary | ICD-10-CM | POA: Diagnosis present

## 2016-10-24 DIAGNOSIS — K5791 Diverticulosis of intestine, part unspecified, without perforation or abscess with bleeding: Principal | ICD-10-CM | POA: Diagnosis present

## 2016-10-24 DIAGNOSIS — R404 Transient alteration of awareness: Secondary | ICD-10-CM | POA: Diagnosis not present

## 2016-10-24 DIAGNOSIS — R42 Dizziness and giddiness: Secondary | ICD-10-CM | POA: Diagnosis not present

## 2016-10-24 DIAGNOSIS — R55 Syncope and collapse: Secondary | ICD-10-CM | POA: Diagnosis present

## 2016-10-24 DIAGNOSIS — Z79899 Other long term (current) drug therapy: Secondary | ICD-10-CM

## 2016-10-24 DIAGNOSIS — Z7902 Long term (current) use of antithrombotics/antiplatelets: Secondary | ICD-10-CM

## 2016-10-24 DIAGNOSIS — D500 Iron deficiency anemia secondary to blood loss (chronic): Secondary | ICD-10-CM | POA: Diagnosis not present

## 2016-10-24 DIAGNOSIS — Z7951 Long term (current) use of inhaled steroids: Secondary | ICD-10-CM

## 2016-10-24 DIAGNOSIS — J45909 Unspecified asthma, uncomplicated: Secondary | ICD-10-CM | POA: Diagnosis not present

## 2016-10-24 DIAGNOSIS — K529 Noninfective gastroenteritis and colitis, unspecified: Secondary | ICD-10-CM | POA: Diagnosis not present

## 2016-10-24 DIAGNOSIS — E119 Type 2 diabetes mellitus without complications: Secondary | ICD-10-CM

## 2016-10-24 DIAGNOSIS — Z66 Do not resuscitate: Secondary | ICD-10-CM | POA: Diagnosis present

## 2016-10-24 DIAGNOSIS — E118 Type 2 diabetes mellitus with unspecified complications: Secondary | ICD-10-CM | POA: Diagnosis not present

## 2016-10-24 DIAGNOSIS — R270 Ataxia, unspecified: Secondary | ICD-10-CM | POA: Diagnosis present

## 2016-10-24 DIAGNOSIS — Z9081 Acquired absence of spleen: Secondary | ICD-10-CM | POA: Diagnosis not present

## 2016-10-24 DIAGNOSIS — Z881 Allergy status to other antibiotic agents status: Secondary | ICD-10-CM

## 2016-10-24 DIAGNOSIS — E785 Hyperlipidemia, unspecified: Secondary | ICD-10-CM | POA: Diagnosis not present

## 2016-10-24 DIAGNOSIS — D62 Acute posthemorrhagic anemia: Secondary | ICD-10-CM | POA: Diagnosis present

## 2016-10-24 DIAGNOSIS — K625 Hemorrhage of anus and rectum: Secondary | ICD-10-CM | POA: Diagnosis present

## 2016-10-24 DIAGNOSIS — I639 Cerebral infarction, unspecified: Secondary | ICD-10-CM | POA: Diagnosis not present

## 2016-10-24 DIAGNOSIS — K5731 Diverticulosis of large intestine without perforation or abscess with bleeding: Secondary | ICD-10-CM | POA: Diagnosis not present

## 2016-10-24 DIAGNOSIS — H8110 Benign paroxysmal vertigo, unspecified ear: Secondary | ICD-10-CM | POA: Diagnosis not present

## 2016-10-24 DIAGNOSIS — G40919 Epilepsy, unspecified, intractable, without status epilepticus: Secondary | ICD-10-CM | POA: Diagnosis present

## 2016-10-24 DIAGNOSIS — Z8673 Personal history of transient ischemic attack (TIA), and cerebral infarction without residual deficits: Secondary | ICD-10-CM

## 2016-10-24 DIAGNOSIS — I10 Essential (primary) hypertension: Secondary | ICD-10-CM | POA: Diagnosis present

## 2016-10-24 DIAGNOSIS — I480 Paroxysmal atrial fibrillation: Secondary | ICD-10-CM | POA: Diagnosis present

## 2016-10-24 DIAGNOSIS — K922 Gastrointestinal hemorrhage, unspecified: Secondary | ICD-10-CM | POA: Diagnosis not present

## 2016-10-24 DIAGNOSIS — Z86010 Personal history of colonic polyps: Secondary | ICD-10-CM | POA: Diagnosis not present

## 2016-10-24 LAB — HEPATIC FUNCTION PANEL
ALT: 10 U/L — AB (ref 17–63)
AST: 18 U/L (ref 15–41)
Albumin: 3.1 g/dL — ABNORMAL LOW (ref 3.5–5.0)
Alkaline Phosphatase: 67 U/L (ref 38–126)
TOTAL PROTEIN: 6.3 g/dL — AB (ref 6.5–8.1)
Total Bilirubin: 0.7 mg/dL (ref 0.3–1.2)

## 2016-10-24 LAB — BASIC METABOLIC PANEL
ANION GAP: 7 (ref 5–15)
BUN: 24 mg/dL — ABNORMAL HIGH (ref 6–20)
CO2: 24 mmol/L (ref 22–32)
Calcium: 8.4 mg/dL — ABNORMAL LOW (ref 8.9–10.3)
Chloride: 105 mmol/L (ref 101–111)
Creatinine, Ser: 0.92 mg/dL (ref 0.61–1.24)
GFR calc Af Amer: >60 mL/min (ref >60)
GFR calc non Af Amer: >60 mL/min (ref >60)
Glucose, Bld: 134 mg/dL — ABNORMAL HIGH (ref 65–99)
POTASSIUM: 4.3 mmol/L (ref 3.5–5.1)
Sodium: 136 mmol/L (ref 135–145)

## 2016-10-24 LAB — CBC
HEMATOCRIT: 39.7 % (ref 39.0–52.0)
HEMOGLOBIN: 12.6 g/dL — AB (ref 13.0–17.0)
MCH: 27.8 pg (ref 26.0–34.0)
MCHC: 31.7 g/dL (ref 30.0–36.0)
MCV: 87.4 fL (ref 78.0–100.0)
PLATELETS: 339 10*3/uL (ref 150–400)
RBC: 4.54 MIL/uL (ref 4.22–5.81)
RDW: 15 % (ref 11.5–15.5)
WBC: 13.2 10*3/uL — ABNORMAL HIGH (ref 4.0–10.5)

## 2016-10-24 LAB — TYPE AND SCREEN
ABO/RH(D): O POS
Antibody Screen: NEGATIVE

## 2016-10-24 LAB — PROTIME-INR
INR: 1.07
PROTHROMBIN TIME: 13.8 s (ref 11.4–15.2)

## 2016-10-24 LAB — HEMOGLOBIN AND HEMATOCRIT, BLOOD
HEMATOCRIT: 38 % — AB (ref 39.0–52.0)
HEMOGLOBIN: 12 g/dL — AB (ref 13.0–17.0)

## 2016-10-24 LAB — GLUCOSE, CAPILLARY
GLUCOSE-CAPILLARY: 160 mg/dL — AB (ref 65–99)
GLUCOSE-CAPILLARY: 132 mg/dL — AB (ref 65–99)

## 2016-10-24 LAB — CBG MONITORING, ED: Glucose-Capillary: 130 mg/dL — ABNORMAL HIGH (ref 65–99)

## 2016-10-24 LAB — POC OCCULT BLOOD, ED: Fecal Occult Bld: POSITIVE — AB

## 2016-10-24 MED ORDER — FLUTICASONE PROPIONATE 50 MCG/ACT NA SUSP
2.0000 | Freq: Every day | NASAL | Status: DC
  Administered 2016-10-24: 2 via NASAL
  Filled 2016-10-24: qty 16

## 2016-10-24 MED ORDER — LORATADINE 10 MG PO TABS
10.0000 mg | ORAL_TABLET | Freq: Every day | ORAL | Status: DC
  Administered 2016-10-25: 10 mg via ORAL
  Filled 2016-10-24: qty 1

## 2016-10-24 MED ORDER — ONDANSETRON HCL 4 MG/2ML IJ SOLN: 4.0000 mg | Freq: Four times a day (QID) | INTRAMUSCULAR | Status: DC | PRN

## 2016-10-24 MED ORDER — SIMVASTATIN 40 MG PO TABS
40.0000 mg | ORAL_TABLET | Freq: Every day | ORAL | Status: DC
  Administered 2016-10-24: 40 mg via ORAL
  Filled 2016-10-24: qty 1

## 2016-10-24 MED ORDER — ROPINIROLE HCL 0.25 MG PO TABS
0.2500 mg | ORAL_TABLET | Freq: Every day | ORAL | Status: DC
  Administered 2016-10-24: 0.25 mg via ORAL
  Filled 2016-10-24 (×2): qty 1

## 2016-10-24 MED ORDER — MONTELUKAST SODIUM 10 MG PO TABS
10.0000 mg | ORAL_TABLET | Freq: Every day | ORAL | Status: DC
  Administered 2016-10-24: 10 mg via ORAL
  Filled 2016-10-24: qty 1

## 2016-10-24 MED ORDER — GUAIFENESIN ER 600 MG PO TB12
600.0000 mg | ORAL_TABLET | Freq: Every day | ORAL | Status: DC
  Administered 2016-10-24: 600 mg via ORAL
  Filled 2016-10-24: qty 1

## 2016-10-24 MED ORDER — CARVEDILOL 3.125 MG PO TABS
3.1250 mg | ORAL_TABLET | Freq: Two times a day (BID) | ORAL | Status: DC
Start: 2016-10-24 — End: 2016-10-25
  Administered 2016-10-24 – 2016-10-25 (×3): 3.125 mg via ORAL
  Filled 2016-10-24 (×3): qty 1

## 2016-10-24 MED ORDER — MOMETASONE FURO-FORMOTEROL FUM 100-5 MCG/ACT IN AERO
2.0000 | INHALATION_SPRAY | Freq: Two times a day (BID) | RESPIRATORY_TRACT | Status: DC
  Administered 2016-10-24 – 2016-10-25 (×2): 2 via RESPIRATORY_TRACT
  Filled 2016-10-24 (×2): qty 8.8

## 2016-10-24 MED ORDER — ZOLPIDEM TARTRATE 5 MG PO TABS
5.0000 mg | ORAL_TABLET | Freq: Every evening | ORAL | Status: DC | PRN
  Administered 2016-10-24: 5 mg via ORAL
  Filled 2016-10-24: qty 1

## 2016-10-24 MED ORDER — SODIUM CHLORIDE 0.9 % IV SOLN
INTRAVENOUS | Status: DC
  Administered 2016-10-24 – 2016-10-25 (×3): via INTRAVENOUS

## 2016-10-24 MED ORDER — ACETAMINOPHEN 325 MG PO TABS: 650.0000 mg | ORAL_TABLET | Freq: Four times a day (QID) | ORAL | Status: DC | PRN

## 2016-10-24 MED ORDER — ALBUTEROL SULFATE HFA 108 (90 BASE) MCG/ACT IN AERS: 2.0000 | INHALATION_SPRAY | Freq: Four times a day (QID) | RESPIRATORY_TRACT | Status: DC | PRN

## 2016-10-24 MED ORDER — BACLOFEN 10 MG PO TABS
5.0000 mg | ORAL_TABLET | Freq: Every day | ORAL | Status: DC
  Administered 2016-10-24: 5 mg via ORAL
  Filled 2016-10-24: qty 1

## 2016-10-24 MED ORDER — ACETAMINOPHEN 650 MG RE SUPP: 650.0000 mg | Freq: Four times a day (QID) | RECTAL | Status: DC | PRN

## 2016-10-24 MED ORDER — ACETAMINOPHEN 500 MG PO TABS: 1000.0000 mg | ORAL_TABLET | Freq: Four times a day (QID) | ORAL | Status: DC | PRN

## 2016-10-24 MED ORDER — ALBUTEROL SULFATE (2.5 MG/3ML) 0.083% IN NEBU: 2.5000 mg | INHALATION_SOLUTION | Freq: Four times a day (QID) | RESPIRATORY_TRACT | Status: DC | PRN

## 2016-10-24 MED ORDER — ONDANSETRON HCL 4 MG PO TABS: 4.0000 mg | ORAL_TABLET | Freq: Four times a day (QID) | ORAL | Status: DC | PRN

## 2016-10-24 NOTE — ED Notes (Signed)
Fall risk wrist band, socks and door sign applied.

## 2016-10-24 NOTE — ED Notes (Signed)
Attempted report 

## 2016-10-24 NOTE — Progress Notes (Signed)
Pt arrived at the unit via bed at 1530.

## 2016-10-24 NOTE — ED Provider Notes (Signed)
[facility] DEPT Provider Note   CSN: [id number] Arrival date & time: 10/24/16  1540     History   Chief Complaint Chief Complaint  Patient presents with  . Dizziness  . Rectal Bleeding    HPI [name] is a 83 y.o. male.  Patient brought in by EMS from [facility]. Patient with onset of rectal bleeding red blood large amount this morning. Did pass out coming out of the shower did not fall or hit his head his wife caught him. Patient with a history of GI bleed back in December. Exact source was never figured out it was suspected to be diverticular. Patient also had a stroke in January. And questional TIA in March. Patient is not on blood thinners. He is on Plavix.  Patient here with good color no significant complaints. Denies any abdominal pain. Has had dizziness on and off for the past few days. Not significant only worse today but still present.  From the previous stroke patient still has residual left-sided weakness.      Past Medical History:  Diagnosis Date  . Acute CVA (cerebrovascular accident) ([place]) 07/25/2015  . Asthma   . Benign essential HTN   . Cerebral infarction due to embolism of left middle cerebral artery ([place]) 02/03/2014  . Colitis   . Colon polyps   . Diabetes mellitus type 2, diet-controlled ([place]) 12/22/2013  . Diverticulosis   . GERD (gastroesophageal reflux disease)   . Hemorrhoids   . Hiatal hernia   . History of esophageal strciture   . HLD (hyperlipidemia)   . Osteoarthritis   . Proctitis   . Status post dilation of esophageal narrowing   . Stroke [facility])     Patient Active Problem List   Diagnosis Date Noted  . TIA (transient ischemic attack) 07/22/2016  . Diabetes mellitus with complication ([place])   . Benign essential HTN   . PAF (paroxysmal atrial fibrillation) ([place])   . Subtherapeutic international normalized ratio (INR)   . Acute CVA (cerebrovascular accident) ([place]) 02/23/2016  . Stroke-like symptom 02/22/2016  . Leukocytosis  02/22/2016  . History of lower GI bleeding Dec 2017 02/01/2016  . Chronic anticoagulation 2/2 history of CVA 01/31/2016  . History of CVA (cerebrovascular accident) 01/31/2016  . Sinusitis, chronic 12/12/2015  . Cough variant asthma 10/31/2015  . Insomnia 07/25/2015  . HLD (hyperlipidemia) 02/03/2014  . Ulnar neuropathy at elbow of right upper extremity 02/03/2014  . Cervical radiculopathy 02/03/2014  . Seizures ([place])   . Diabetes mellitus type 2, diet-controlled ([place]) 12/22/2013  . Recurrent ventral hernia 12/15/2011  . History of esophageal strciture     Past Surgical History:  Procedure Laterality Date  . ABDOMINAL HERNIA REPAIR    . esophageal hernia    . INSERTION OF MESH  01/29/2012   Procedure: INSERTION OF MESH;  Surgeon: Gwenyth Ober, MD;  Location: [place];  Service: General;  Laterality: N/A;  . nissen    . SPLENECTOMY, TOTAL     nontraumatic rupture  . VENTRAL HERNIA REPAIR  01/29/2012    WITH MESH  . VENTRAL HERNIA REPAIR  01/29/2012   Procedure: HERNIA REPAIR VENTRAL ADULT;  Surgeon: Gwenyth Ober, MD;  Location: [facility];  Service: General;  Laterality: N/A;  open recurrent ventral hernia repair with mesh       Home Medications    Prior to Admission medications   Medication Sig Start Date End Date Taking? Authorizing Provider  acetaminophen (TYLENOL) 500 MG tablet Take 1,000 mg  by mouth every 6 (six) hours as needed for headache (pain).   Yes [provider]  albuterol (PROAIR HFA) 108 (90 Base) MCG/ACT inhaler Inhale 2 puffs into the lungs every 6 (six) hours as needed for wheezing or shortness of breath.   Yes [provider]  baclofen (LIORESAL) 10 MG tablet Take 5 mg by mouth at bedtime.    Yes [provider]  carvedilol (COREG) 3.125 MG tablet Take 1 tablet (3.125 mg total) by mouth 2 (two) times daily with a meal. 02/26/16  Yes Amin, Ankit Chirag, MD  cetirizine (ZYRTEC) 10 MG tablet Take 10 mg by mouth daily.   Yes [provider]  clopidogrel (PLAVIX) 75 MG tablet Take 1 tablet (75 mg total) by mouth daily. 07/22/16  Yes Dennie Bible, NP  fluticasone [facility]) 50 MCG/ACT nasal spray Place 2 sprays into both nostrils at bedtime.  06/18/14  Yes [provider]  guaiFENesin (MUCINEX) 600 MG 12 hr tablet Take 600 mg by mouth at bedtime.   Yes [provider]  mometasone-formoterol (DULERA) 100-5 MCG/ACT AERO Inhale 2 puffs into the lungs 2 (two) times daily. 12/12/15  Yes Tanda Rockers, MD  montelukast (SINGULAIR) 10 MG tablet Take 1 tablet (10 mg total) by mouth at bedtime. 10/31/15  Yes Tanda Rockers, MD  rOPINIRole (REQUIP) 0.25 MG tablet Take 1 tablet (0.25 mg total) by mouth at bedtime. 02/26/16  Yes Amin, Jeanella Flattery, MD  simvastatin (ZOCOR) 40 MG tablet Take 1 tablet (40 mg total) by mouth daily. Patient taking differently: Take 40 mg by mouth at bedtime.  02/26/16  Yes Amin, Ankit Chirag, MD  zolpidem (AMBIEN) 10 MG tablet Take 10 mg by mouth at bedtime as needed for sleep.   Yes [provider]  zolpidem (AMBIEN) 5 MG tablet Take 1 tablet (5 mg total) by mouth at bedtime as needed for sleep. Patient not taking: Reported on 10/24/2016 02/26/16   Damita Lack, MD    Family History Family History  Problem Relation Age of Onset  . Cancer Father     Social History Social History  Substance Use Topics  . Smoking status: Never Smoker  . Smokeless tobacco: Never Used  . Alcohol use 0.6 oz/week    1 Glasses of wine per week     Comment: daily     Allergies   Azithromycin   Review of Systems Review of Systems  Constitutional: Negative for fever.  HENT: Negative for congestion.   Eyes: Negative for redness.  Respiratory: Negative for shortness of breath.   Cardiovascular: Negative for chest pain.  Gastrointestinal: Positive for blood in stool. Negative for abdominal pain, nausea and vomiting.  Genitourinary: Negative for dysuria.  Musculoskeletal:  Negative for back pain.  Skin: Negative for rash.  Neurological: Positive for dizziness, syncope and weakness.  Hematological: Does not bruise/bleed easily.  Psychiatric/Behavioral: Positive for confusion.     Physical Exam Updated Vital Signs BP 129/79   Pulse 70   Temp (!) 97.5 F (36.4 C) (Oral)   Resp 19   Ht 1.791 m (5' 10.5")   Wt 72.1 kg (159 lb)   SpO2 97%   BMI 22.49 kg/m   Physical Exam  Constitutional: He appears well-developed and well-nourished. No distress.  HENT:  Head: Normocephalic and atraumatic.  Mouth/Throat: Oropharynx is clear and moist.  Eyes: Pupils are equal, round, and reactive to light. Conjunctivae and EOM are normal.  Neck: Neck supple.  Cardiovascular: Normal rate, regular  rhythm and normal heart sounds.   Pulmonary/Chest: Effort normal and breath sounds normal.  Abdominal: Soft. Bowel sounds are normal. There is no tenderness.  Genitourinary:  Genitourinary Comments: Rectal exam gross dark red blood. No anal fissure. No external hemorrhoids. No prolapsed internal hemorrhoids.  Musculoskeletal:  Persistent residual weakness to the left side.  Neurological: He is alert.  Persistent residual weakness to left side.  Skin: Skin is warm.  Nursing note and vitals reviewed.    ED Treatments / Results  Labs (all labs ordered are listed, but only abnormal results are displayed) Labs Reviewed  BASIC METABOLIC PANEL - Abnormal; Notable for the following:       Result Value   Glucose, Bld 134 (*)    BUN 24 (*)    Calcium 8.4 (*)    All other components within normal limits  CBC - Abnormal; Notable for the following:    WBC 13.2 (*)    Hemoglobin 12.6 (*)    All other components within normal limits  HEPATIC FUNCTION PANEL - Abnormal; Notable for the following:    Total Protein 6.3 (*)    Albumin 3.1 (*)    ALT 10 (*)    Bilirubin, Direct <0.1 (*)    All other components within normal limits  CBG MONITORING, ED - Abnormal; Notable for the  following:    Glucose-Capillary 130 (*)    All other components within normal limits  PROTIME-INR  CBG MONITORING, ED  POC OCCULT BLOOD, ED  TYPE AND SCREEN    EKG  EKG Interpretation  Date/Time:  Saturday October 24 2016 09:43:29 EDT Ventricular Rate:  66 PR Interval:    QRS Duration: 95 QT Interval:  468 QTC Calculation: 491 R Axis:   -18 Text Interpretation:  Sinus rhythm Borderline prolonged PR interval Borderline left axis deviation Borderline prolonged QT interval Confirmed by Fredia Sorrow [phone]) on 10/24/2016 10:34:36 AM       Radiology Ct Head Wo Contrast  Result Date: 10/24/2016 CLINICAL DATA:  Ataxia.  History of stroke. EXAM: CT HEAD WITHOUT CONTRAST TECHNIQUE: Contiguous axial images were obtained from the base of the skull through the vertex without intravenous contrast. COMPARISON:  May 04, 2016 FINDINGS: Brain: No subdural, epidural, or subarachnoid hemorrhage. Prominence of ventricles and sulci is stable. Cerebellum, brainstem, and basal cisterns are normal. White matter changes and lacunar infarcts are stable. No acute cortical ischemia or infarct is noted. No mass effect or midline shift. Vascular: No hyperdense vessel or unexpected calcification. Skull: Normal. Negative for fracture or focal lesion. Sinuses/Orbits: Significant increased opacification of the right greater than left maxillary sinus, ethmoid sinuses, frontal sinuses, and sphenoid sinuses. Mastoid air cells and middle ears are stable. Opacification of inferior left mastoid air cells remains. Other: None. IMPRESSION: 1. No acute intracranial abnormality or change. The ventricles remain dilated but stable. In the setting of ataxia, the possibility of normal pressure hydrocephalus should be entertained. There have been no intracranial changes in the interval however. 2. Increasing sinus disease as above. Electronically Signed   By: Dorise Bullion III M.D   On: 10/24/2016 10:58     Procedures Procedures (including critical care time)  Medications Ordered in ED Medications  0.9 %  sodium chloride infusion ( Intravenous New Bag/Given 10/24/16 1126)     Initial Impression / Assessment and Plan / ED Course  I have reviewed the triage vital signs and the nursing notes.  Pertinent labs & imaging results that were available during my care of  the patient were reviewed by me and considered in my medical decision making (see chart for details).    Patient with recurrent GI bleeding. With syncopal episode. Had bloody bowel movement here. HEENT positive grossly on rectal exam. No abdominal pain. Patient not tachycardic no significant anemia. Patient followed by gastroenterology by Dr. Ardis Hughs. Consult placed. Will be admitted by hospitalist.   Dizziness could props represent a recurrent CVA or could be related to the GI bleed. It is intermittent is not persistent. Admitting team aware.  Final Clinical Impressions(s) / ED Diagnoses   Final diagnoses:  Gastrointestinal hemorrhage, unspecified gastrointestinal hemorrhage type  Syncope, unspecified syncope type  Dizziness    New Prescriptions New Prescriptions   No medications on file     Fredia Sorrow, MD 10/24/16 1352

## 2016-10-24 NOTE — ED Notes (Signed)
Helped get patient hooked up to the monitor patient is resting with call bell in reach

## 2016-10-24 NOTE — H&P (Signed)
History and Physical  [name] OMB:[id number] DOB: [birth year] DOA: 10/24/2016  Referring physician: Dr Rogene Houston , ED physician PCP: Seward Carol, MD  Outpatient Specialists:   Leonie Man, MD (Neurology)  Ardis Hughs, MD (GI)  Patient Coming From: [facility]  Chief Complaint: Dizziness, blood in stool  HPI: [name] is a 83 y.o. male with a history of diverticulitis, colonic polyps, hypertension, diabetes type 2 (diet-controlled), GERD, history of CVA on Plavix, hyperlipidemia. Patient received 4 blood in his stool started this morning. Patient had an episode of painless hematochezia this morning that consisted mostly of blood. He states that it was large amount. He had a subsequent episode in the emergency department that was witnessed.  Additionally, patient has had 2-3 weeks of intermittent "dizziness" that he describes as feeling "lightheaded and spacey". When these episodes happen, they last for a few moments, the resolve spontaneously. No particular pattern to the episodes. This morning, he felt that way, and had a near syncope episode. His wife caught him and helps him to the floor. When EMS arrived to bring the patient to emergency department, the patient had a syncopal episode when they were lifting him to the stretcher. Responded spontaneously and now feels fine.  Emergency Department Course: Vitals are normal. I requested that GI be consulted. Hemoglobin decreased from 14.3-12.6 (baseline from 06/2016)  Review of Systems:   Pt denies any fevers, chills, nausea, vomiting, diarrhea, constipation, abdominal pain, shortness of breath, dyspnea on exertion, orthopnea, cough, wheezing, palpitations, headache, vision changes, lightheadedness, dizziness, melena, rectal bleeding.  Review of systems are otherwise negative  Past Medical History:  Diagnosis Date  . Acute CVA (cerebrovascular accident) ([place]) 07/25/2015  . Asthma   . Benign essential HTN   . Cerebral infarction due to  embolism of left middle cerebral artery ([place]) 02/03/2014  . Colitis   . Colon polyps   . Diabetes mellitus type 2, diet-controlled ([place]) 12/22/2013  . Diverticulosis   . GERD (gastroesophageal reflux disease)   . Hemorrhoids   . Hiatal hernia   . History of esophageal strciture   . HLD (hyperlipidemia)   . Osteoarthritis   . Proctitis   . Status post dilation of esophageal narrowing   . Stroke [facility])    Past Surgical History:  Procedure Laterality Date  . ABDOMINAL HERNIA REPAIR    . esophageal hernia    . INSERTION OF MESH  01/29/2012   Procedure: INSERTION OF MESH;  Surgeon: Gwenyth Ober, MD;  Location: [place];  Service: General;  Laterality: N/A;  . nissen    . SPLENECTOMY, TOTAL     nontraumatic rupture  . VENTRAL HERNIA REPAIR  01/29/2012    WITH MESH  . VENTRAL HERNIA REPAIR  01/29/2012   Procedure: HERNIA REPAIR VENTRAL ADULT;  Surgeon: Gwenyth Ober, MD;  Location: [place];  Service: General;  Laterality: N/A;  open recurrent ventral hernia repair with mesh   Social History:  reports that he has never smoked. He has never used smokeless tobacco. He reports that he drinks about 0.6 oz of alcohol per week . He reports that he does not use drugs. Patient lives [facility]  Allergies  Allergen Reactions  . Azithromycin Rash and Other (See Comments)    Possible reaction to Z-Pak    Family History  Problem Relation Age of Onset  . Cancer Father       Prior to Admission medications   Medication Sig Start Date End Date Taking? Authorizing Provider  acetaminophen (TYLENOL) 500 MG tablet Take 1,000 mg by mouth every 6 (six) hours as needed for headache (pain).   Yes [provider]  albuterol (PROAIR HFA) 108 (90 Base) MCG/ACT inhaler Inhale 2 puffs into the lungs every 6 (six) hours as needed for wheezing or shortness of breath.   Yes [provider]  baclofen (LIORESAL) 10 MG tablet Take 5 mg by mouth at bedtime.    Yes [provider]    carvedilol (COREG) 3.125 MG tablet Take 1 tablet (3.125 mg total) by mouth 2 (two) times daily with a meal. 02/26/16  Yes Amin, Ankit Chirag, MD  cetirizine (ZYRTEC) 10 MG tablet Take 10 mg by mouth daily.   Yes [provider]  clopidogrel (PLAVIX) 75 MG tablet Take 1 tablet (75 mg total) by mouth daily. 07/22/16  Yes Dennie Bible, NP  fluticasone [facility]) 50 MCG/ACT nasal spray Place 2 sprays into both nostrils at bedtime.  06/18/14  Yes [provider]  guaiFENesin (MUCINEX) 600 MG 12 hr tablet Take 600 mg by mouth at bedtime.   Yes [provider]  mometasone-formoterol (DULERA) 100-5 MCG/ACT AERO Inhale 2 puffs into the lungs 2 (two) times daily. 12/12/15  Yes Tanda Rockers, MD  montelukast (SINGULAIR) 10 MG tablet Take 1 tablet (10 mg total) by mouth at bedtime. 10/31/15  Yes Tanda Rockers, MD  rOPINIRole (REQUIP) 0.25 MG tablet Take 1 tablet (0.25 mg total) by mouth at bedtime. 02/26/16  Yes Amin, Jeanella Flattery, MD  simvastatin (ZOCOR) 40 MG tablet Take 1 tablet (40 mg total) by mouth daily. Patient taking differently: Take 40 mg by mouth at bedtime.  02/26/16  Yes Amin, Ankit Chirag, MD  zolpidem (AMBIEN) 10 MG tablet Take 10 mg by mouth at bedtime as needed for sleep.   Yes [provider]    Physical Exam: BP 130/74   Pulse 79   Temp (!) 97.5 F (36.4 C) (Oral)   Resp 18   Ht 5' 10.5" (1.791 m)   Wt 72.1 kg (159 lb)   SpO2 97%   BMI 22.49 kg/m   General: Elderly Caucasian male. Awake and alert and oriented x3. No acute cardiopulmonary distress.  HEENT: Normocephalic atraumatic.  Right and left ears normal in appearance.  Pupils equal, round, reactive to light. Extraocular muscles are intact. Sclerae anicteric and noninjected.  Moist mucosal membranes. No mucosal lesions.  Neck: Neck supple without lymphadenopathy. No carotid bruits. No masses palpated.  Cardiovascular: Regular rate with normal S1-S2 sounds. No murmurs, rubs, gallops  auscultated. No JVD.  Respiratory: Good respiratory effort with no wheezes, rales, rhonchi. Lungs clear to auscultation bilaterally.  No accessory muscle use. Abdomen: Soft, nontender, nondistended. Active bowel sounds. No masses or hepatosplenomegaly  Skin: No rashes, lesions, or ulcerations.  Dry, warm to touch. 2+ dorsalis pedis and radial pulses. Musculoskeletal: No calf or leg pain. All major joints not erythematous nontender.  No upper or lower joint deformation.  Good ROM.  No contractures  Psychiatric: Intact judgment and insight. Pleasant and cooperative. Neurologic: No focal neurological deficits. Strength is 5/5 and symmetric in upper and lower extremities.  Cranial nerves II through XII are grossly intact.           Labs on Admission: I have personally reviewed following labs and imaging studies  CBC:  Recent Labs Lab 10/24/16 0959  WBC 13.2*  HGB 12.6*  HCT 39.7  MCV 87.4  PLT 161   Basic Metabolic Panel:  Recent  Labs Lab 10/24/16 0959  NA 136  K 4.3  CL 105  CO2 24  GLUCOSE 134*  BUN 24*  CREATININE 0.92  CALCIUM 8.4*   GFR: Estimated Creatinine Clearance: 62 mL/min (by C-G formula based on SCr of 0.92 mg/dL). Liver Function Tests:  Recent Labs Lab 10/24/16 1017  AST 18  ALT 10*  ALKPHOS 67  BILITOT 0.7  PROT 6.3*  ALBUMIN 3.1*   No results for input(s): LIPASE, AMYLASE in the last 168 hours. No results for input(s): AMMONIA in the last 168 hours. Coagulation Profile:  Recent Labs Lab 10/24/16 1017  INR 1.07   Cardiac Enzymes: No results for input(s): CKTOTAL, CKMB, CKMBINDEX, TROPONINI in the last 168 hours. BNP (last 3 results) No results for input(s): PROBNP in the last 8760 hours. HbA1C: No results for input(s): HGBA1C in the last 72 hours. CBG:  Recent Labs Lab 10/24/16 0947  GLUCAP 130*   Lipid Profile: No results for input(s): CHOL, HDL, LDLCALC, TRIG, CHOLHDL, LDLDIRECT in the last 72 hours. Thyroid Function Tests: No  results for input(s): TSH, T4TOTAL, FREET4, T3FREE, THYROIDAB in the last 72 hours. Anemia Panel: No results for input(s): VITAMINB12, FOLATE, FERRITIN, TIBC, IRON, RETICCTPCT in the last 72 hours. Urine analysis:    Component Value Date/Time   COLORURINE YELLOW 05/04/2016 1123   APPEARANCEUR CLEAR 05/04/2016 1123   LABSPEC 1.011 05/04/2016 1123   PHURINE 7.0 05/04/2016 1123   GLUCOSEU NEGATIVE 05/04/2016 1123   HGBUR NEGATIVE 05/04/2016 1123   BILIRUBINUR NEGATIVE 05/04/2016 1123   KETONESUR NEGATIVE 05/04/2016 1123   PROTEINUR NEGATIVE 05/04/2016 1123   UROBILINOGEN 0.2 12/22/2013 1210   NITRITE NEGATIVE 05/04/2016 1123   LEUKOCYTESUR NEGATIVE 05/04/2016 1123   Sepsis Labs: @LABRCNTIP (procalcitonin:4,lacticidven:4) )No results found for this or any previous visit (from the past 240 hour(s)).   Radiological Exams on Admission: Ct Head Wo Contrast  Result Date: 10/24/2016 CLINICAL DATA:  Ataxia.  History of stroke. EXAM: CT HEAD WITHOUT CONTRAST TECHNIQUE: Contiguous axial images were obtained from the base of the skull through the vertex without intravenous contrast. COMPARISON:  May 04, 2016 FINDINGS: Brain: No subdural, epidural, or subarachnoid hemorrhage. Prominence of ventricles and sulci is stable. Cerebellum, brainstem, and basal cisterns are normal. White matter changes and lacunar infarcts are stable. No acute cortical ischemia or infarct is noted. No mass effect or midline shift. Vascular: No hyperdense vessel or unexpected calcification. Skull: Normal. Negative for fracture or focal lesion. Sinuses/Orbits: Significant increased opacification of the right greater than left maxillary sinus, ethmoid sinuses, frontal sinuses, and sphenoid sinuses. Mastoid air cells and middle ears are stable. Opacification of inferior left mastoid air cells remains. Other: None. IMPRESSION: 1. No acute intracranial abnormality or change. The ventricles remain dilated but stable. In the setting of  ataxia, the possibility of normal pressure hydrocephalus should be entertained. There have been no intracranial changes in the interval however. 2. Increasing sinus disease as above. Electronically Signed   By: Dorise Bullion III M.D   On: 10/24/2016 10:58    EKG: Independently reviewed. Sinus rhythm. No acute ST changes.  Assessment/Plan: Principal Problem:   Lower GI bleed Active Problems:   History of stroke   Diabetes mellitus with complication (HCC)   Benign essential HTN   PAF (paroxysmal atrial fibrillation) ([place])   Syncope    This patient was discussed with the ED physician, including pertinent vitals, physical exam findings, labs, and imaging.  We also discussed care given by the ED provider.  #1 lower  GI bleed  Admit  Serial H&H  Transfuse as needed  GI consulted - appreciate their input  Hold Plavix and aspirin #2 syncope  Telemetry monitoring  Echocardiogram tomorrow #3 history of stroke  We'll hold Plavix for now and restart when able #4 diabetes type 2  Diet-controlled #5 PAF  Rhythm controlled #6 hypertension  Continue antihypertensives  DVT prophylaxis: SCDs Consultants: GI Code Status: DO NOT RESUSCITATE Family Communication: Wife in the room  Disposition Plan: Patient to go back to [facility] after admission   Truett Mainland, DO [facility] Pager [phone]  If 7PM-7AM, please contact night-coverage [email or web address] Password [email or web address]

## 2016-10-24 NOTE — Consult Note (Signed)
Consult for [facility]  Reason for Consult: Hematochezia Referring Physician: [place] HPI: This is an 83 year old male with a PMH of pandiverticula, colonic polyps, HTN, asthma, and CVA admitted for complaints of painless hematochezia.  He had a large bloody bowel movement at home and one witness in the ER.  The rectal examination in the ER was negative for any masses, but it confirmed the fresh blood.  During the initial bleeding he had a near syncopal episode and his wife caught him before he fell onto the floor from the shower.  On 01/29/2016 the patient had one bout of hematochezia and he was supposed to follow up with Dr. Eugenia Pancoast but this follow up did not occur.  The bleeding stopped spontaneously.  His last colonoscopy was on 08/08/2007 and it was significant for a pandiverticulosis.  The patient and his wife both report that the dizziness preceded the hematochezia and this dizziness is new.  In the past when he did have diverticular bleeds he never had dizziness.  Past Medical History:  Diagnosis Date  . Acute CVA (cerebrovascular accident) ([place]) 07/25/2015  . Asthma   . Benign essential HTN   . Cerebral infarction due to embolism of left middle cerebral artery ([place]) 02/03/2014  . Colitis   . Colon polyps   . Diabetes mellitus type 2, diet-controlled ([place]) 12/22/2013  . Diverticulosis   . GERD (gastroesophageal reflux disease)   . Hemorrhoids   . Hiatal hernia   . History of esophageal strciture   . HLD (hyperlipidemia)   . Osteoarthritis   . Proctitis   . Status post dilation of esophageal narrowing   . Stroke [facility])     Past Surgical History:  Procedure Laterality Date  . ABDOMINAL HERNIA REPAIR    . esophageal hernia    . INSERTION OF MESH  01/29/2012   Procedure: INSERTION OF MESH;  Surgeon: Gwenyth Ober, MD;  Location: [facility];  Service: General;  Laterality: N/A;  . nissen    . SPLENECTOMY, TOTAL     nontraumatic rupture  . VENTRAL HERNIA REPAIR   01/29/2012    WITH MESH  . VENTRAL HERNIA REPAIR  01/29/2012   Procedure: HERNIA REPAIR VENTRAL ADULT;  Surgeon: Gwenyth Ober, MD;  Location: [facility];  Service: General;  Laterality: N/A;  open recurrent ventral hernia repair with mesh    Family History  Problem Relation Age of Onset  . Cancer Father     Social History:  reports that he has never smoked. He has never used smokeless tobacco. He reports that he drinks about 0.6 oz of alcohol per week . He reports that he does not use drugs.  Allergies:  Allergies  Allergen Reactions  . Azithromycin Rash and Other (See Comments)    Possible reaction to Z-Pak    Medications:  Scheduled:  Continuous: . sodium chloride 75 mL/hr at 10/24/16 1126    Results for orders placed or performed during the hospital encounter of 10/24/16 (from the past 24 hour(s))  CBG monitoring, ED     Status: Abnormal   Collection Time: 10/24/16  9:47 AM  Result Value Ref Range   Glucose-Capillary 130 (H) 65 - 99 mg/dL  Basic metabolic panel     Status: Abnormal   Collection Time: 10/24/16  9:59 AM  Result Value Ref Range   Sodium 136 135 - 145 mmol/L   Potassium 4.3 3.5 - 5.1 mmol/L   Chloride 105 101 - 111  mmol/L   CO2 24 22 - 32 mmol/L   Glucose, Bld 134 (H) 65 - 99 mg/dL   BUN 24 (H) 6 - 20 mg/dL   Creatinine, Ser 0.92 0.61 - 1.24 mg/dL   Calcium 8.4 (L) 8.9 - 10.3 mg/dL   GFR calc non Af Amer >60 >60 mL/min   GFR calc Af Amer >60 >60 mL/min   Anion gap 7 5 - 15  CBC     Status: Abnormal   Collection Time: 10/24/16  9:59 AM  Result Value Ref Range   WBC 13.2 (H) 4.0 - 10.5 K/uL   RBC 4.54 4.22 - 5.81 MIL/uL   Hemoglobin 12.6 (L) 13.0 - 17.0 g/dL   HCT 39.7 39.0 - 52.0 %   MCV 87.4 78.0 - 100.0 fL   MCH 27.8 26.0 - 34.0 pg   MCHC 31.7 30.0 - 36.0 g/dL   RDW 15.0 11.5 - 15.5 %   Platelets 339 150 - 400 K/uL  Type and screen [place]     Status: None   Collection Time: 10/24/16  9:59 AM  Result Value Ref Range    ABO/RH(D) O POS    Antibody Screen NEG    Sample Expiration 10/27/2016   Hepatic function panel     Status: Abnormal   Collection Time: 10/24/16 10:17 AM  Result Value Ref Range   Total Protein 6.3 (L) 6.5 - 8.1 g/dL   Albumin 3.1 (L) 3.5 - 5.0 g/dL   AST 18 15 - 41 U/L   ALT 10 (L) 17 - 63 U/L   Alkaline Phosphatase 67 38 - 126 U/L   Total Bilirubin 0.7 0.3 - 1.2 mg/dL   Bilirubin, Direct <0.1 (L) 0.1 - 0.5 mg/dL   Indirect Bilirubin NOT CALCULATED 0.3 - 0.9 mg/dL  Protime-INR     Status: None   Collection Time: 10/24/16 10:17 AM  Result Value Ref Range   Prothrombin Time 13.8 11.4 - 15.2 seconds   INR 1.07   POC occult blood, ED Provider will collect     Status: Abnormal   Collection Time: 10/24/16  1:47 PM  Result Value Ref Range   Fecal Occult Bld POSITIVE (A) NEGATIVE     Ct Head Wo Contrast  Result Date: 10/24/2016 CLINICAL DATA:  Ataxia.  History of stroke. EXAM: CT HEAD WITHOUT CONTRAST TECHNIQUE: Contiguous axial images were obtained from the base of the skull through the vertex without intravenous contrast. COMPARISON:  May 04, 2016 FINDINGS: Brain: No subdural, epidural, or subarachnoid hemorrhage. Prominence of ventricles and sulci is stable. Cerebellum, brainstem, and basal cisterns are normal. White matter changes and lacunar infarcts are stable. No acute cortical ischemia or infarct is noted. No mass effect or midline shift. Vascular: No hyperdense vessel or unexpected calcification. Skull: Normal. Negative for fracture or focal lesion. Sinuses/Orbits: Significant increased opacification of the right greater than left maxillary sinus, ethmoid sinuses, frontal sinuses, and sphenoid sinuses. Mastoid air cells and middle ears are stable. Opacification of inferior left mastoid air cells remains. Other: None. IMPRESSION: 1. No acute intracranial abnormality or change. The ventricles remain dilated but stable. In the setting of ataxia, the possibility of normal pressure  hydrocephalus should be entertained. There have been no intracranial changes in the interval however. 2. Increasing sinus disease as above. Electronically Signed   By: Dorise Bullion III M.D   On: 10/24/2016 10:58    ROS:  As stated above in the HPI otherwise negative.  Blood pressure 130/74,  pulse 79, temperature (!) 97.5 F (36.4 C), temperature source Oral, resp. rate 18, height 5' 10.5" (1.791 m), weight 72.1 kg (159 lb), SpO2 97 %.    PE: Gen: NAD, Alert and Oriented HEENT:  [place]/AT, EOMI Neck: Supple, no LAD Lungs: CTA Bilaterally CV: RRR without M/G/R ABM: Soft, NTND, +BS Ext: No C/C/E  Assessment/Plan: 1) Diverticular bleed. 2) Anemia. 3) History of CVA requiring Plavix. 4) Dizinness.   The patient's clinical history is consistent with a diverticular bleed.  There is a drop in his HGB and it may drop some more before he equilibrates.  No further bleeding at this time and he is hemodynamically stable.  With regard to his dizziness, I am not clear about the source.  My presumption was that it was associated with his bleeding, but he and his wife made it clear to me that this is a new symptoms before his bleeding.  Plan: 1) Follow HGB. 2) Transfuse as necessary. 3) ? Repeat colonoscopy was an outpatient as the last colonoscopy was 9 years ago.  I will defer this decision to Dr. Ardis Hughs.  Also, with his dizziness, I want to hold off on any endoscopic procedures.  Layloni Fahrner D 10/24/2016, 2:27 PM

## 2016-10-24 NOTE — Progress Notes (Signed)
ARTHUR SPEAGLE is a 83 y.o. male patient admitted from ED awake, alert - oriented  X 4 - no acute distress noted.  VSS - Blood pressure 126/89, pulse 90, temperature 98.1 F (36.7 C), temperature source Oral, resp. rate 17, height 5\' 10"  (1.778 m), weight 68.6 kg (151 lb 4.8 oz), SpO2 97 %.    IV in place, occlusive dsg intact without redness.  Orientation to room, and floor completed with information packet given to patient/family.  Patient declined safety video at this time.  Admission INP armband ID verified with patient/family, and in place.   SR up x 2, fall assessment complete, with patient and family able to verbalize understanding of risk associated with falls, and verbalized understanding to call nsg before up out of bed.  Call light within reach, patient able to voice, and demonstrate understanding.  Skin, clean-dry- intact without evidence of bruising, or skin tears.   No evidence of skin break down noted on exam.     Will cont to eval and treat per MD orders.  Dorris Carnes, RN 10/24/2016 6:01 PM

## 2016-10-24 NOTE — ED Triage Notes (Signed)
Pt from [facility] with dizziness this morning and rectal bleeding. Pt has no pain and did pass out with EMS when they stood pt up. Pt did not fall or hit head.

## 2016-10-25 ENCOUNTER — Inpatient Hospital Stay (HOSPITAL_COMMUNITY): Payer: MEDICARE

## 2016-10-25 DIAGNOSIS — I10 Essential (primary) hypertension: Secondary | ICD-10-CM | POA: Diagnosis not present

## 2016-10-25 DIAGNOSIS — D62 Acute posthemorrhagic anemia: Secondary | ICD-10-CM | POA: Diagnosis not present

## 2016-10-25 DIAGNOSIS — K5731 Diverticulosis of large intestine without perforation or abscess with bleeding: Secondary | ICD-10-CM | POA: Diagnosis not present

## 2016-10-25 DIAGNOSIS — K922 Gastrointestinal hemorrhage, unspecified: Secondary | ICD-10-CM | POA: Diagnosis present

## 2016-10-25 DIAGNOSIS — K5791 Diverticulosis of intestine, part unspecified, without perforation or abscess with bleeding: Secondary | ICD-10-CM | POA: Diagnosis not present

## 2016-10-25 DIAGNOSIS — I480 Paroxysmal atrial fibrillation: Secondary | ICD-10-CM | POA: Diagnosis not present

## 2016-10-25 DIAGNOSIS — R55 Syncope and collapse: Secondary | ICD-10-CM

## 2016-10-25 DIAGNOSIS — H8110 Benign paroxysmal vertigo, unspecified ear: Secondary | ICD-10-CM | POA: Diagnosis not present

## 2016-10-25 DIAGNOSIS — K625 Hemorrhage of anus and rectum: Secondary | ICD-10-CM | POA: Diagnosis not present

## 2016-10-25 DIAGNOSIS — R42 Dizziness and giddiness: Secondary | ICD-10-CM

## 2016-10-25 DIAGNOSIS — D500 Iron deficiency anemia secondary to blood loss (chronic): Secondary | ICD-10-CM | POA: Diagnosis not present

## 2016-10-25 LAB — COMPREHENSIVE METABOLIC PANEL
ALBUMIN: 2.8 g/dL — AB (ref 3.5–5.0)
ALT: 10 U/L — AB (ref 17–63)
AST: 17 U/L (ref 15–41)
Alkaline Phosphatase: 56 U/L (ref 38–126)
Anion gap: 6 (ref 5–15)
BUN: 23 mg/dL — AB (ref 6–20)
CHLORIDE: 109 mmol/L (ref 101–111)
CO2: 24 mmol/L (ref 22–32)
Calcium: 8.1 mg/dL — ABNORMAL LOW (ref 8.9–10.3)
Creatinine, Ser: 0.77 mg/dL (ref 0.61–1.24)
GFR calc Af Amer: >60 mL/min (ref >60)
GFR calc non Af Amer: >60 mL/min (ref >60)
GLUCOSE: 116 mg/dL — AB (ref 65–99)
POTASSIUM: 3.9 mmol/L (ref 3.5–5.1)
SODIUM: 139 mmol/L (ref 135–145)
TOTAL PROTEIN: 5.1 g/dL — AB (ref 6.5–8.1)
Total Bilirubin: 0.4 mg/dL (ref 0.3–1.2)

## 2016-10-25 LAB — CBC
HCT: 30.3 % — ABNORMAL LOW (ref 39.0–52.0)
Hemoglobin: 9.6 g/dL — ABNORMAL LOW (ref 13.0–17.0)
MCH: 27.7 pg (ref 26.0–34.0)
MCHC: 31.7 g/dL (ref 30.0–36.0)
MCV: 87.3 fL (ref 78.0–100.0)
PLATELETS: 293 10*3/uL (ref 150–400)
RBC: 3.47 MIL/uL — AB (ref 4.22–5.81)
RDW: 15.3 % (ref 11.5–15.5)
WBC: 13.1 10*3/uL — AB (ref 4.0–10.5)

## 2016-10-25 LAB — GLUCOSE, CAPILLARY
GLUCOSE-CAPILLARY: 114 mg/dL — AB (ref 65–99)
GLUCOSE-CAPILLARY: 126 mg/dL — AB (ref 65–99)
Glucose-Capillary: 156 mg/dL — ABNORMAL HIGH (ref 65–99)

## 2016-10-25 MED ORDER — BACLOFEN 10 MG PO TABS: 5.0000 mg | ORAL_TABLET | Freq: Every evening | ORAL | 0 refills | Status: DC | PRN

## 2016-10-25 NOTE — Progress Notes (Signed)
Subjective: Last bleeding episode was yesterday afternoon.  Objective: Vital signs in last 24 hours: Temp:  [97.5 F (36.4 C)-98.2 F (36.8 C)] 97.9 F (36.6 C) (09/09 4742) Pulse Rate:  [37-90] 61 (09/09 0610) Resp:  [15-25] 18 (09/09 0608) BP: (106-153)/(49-91) 117/55 (09/09 0610) SpO2:  [96 %-100 %] 96 % (09/09 0608) Weight:  [68.6 kg (151 lb 4.8 oz)-72.1 kg (159 lb)] 69 kg (152 lb 1.6 oz) (09/09 0418) Last BM Date: 10/24/16  Intake/Output from previous day: 09/08 0701 - 09/09 0700 In: 1370 [I.V.:1370] Out: 200 [Urine:200] Intake/Output this shift: No intake/output data recorded.  General appearance: alert and no distress GI: soft, non-tender; bowel sounds normal; no masses,  no organomegaly  Lab Results:  Recent Labs  10/24/16 0959 10/24/16 1510 10/25/16 0447  WBC 13.2*  --  13.1*  HGB 12.6* 12.0* 9.6*  HCT 39.7 38.0* 30.3*  PLT 339  --  293   BMET  Recent Labs  10/24/16 0959 10/25/16 0447  NA 136 139  K 4.3 3.9  CL 105 109  CO2 24 24  GLUCOSE 134* 116*  BUN 24* 23*  CREATININE 0.92 0.77  CALCIUM 8.4* 8.1*   LFT  Recent Labs  10/24/16 1017 10/25/16 0447  PROT 6.3* 5.1*  ALBUMIN 3.1* 2.8*  AST 18 17  ALT 10* 10*  ALKPHOS 67 56  BILITOT 0.7 0.4  BILIDIR <0.1*  --   IBILI NOT CALCULATED  --    PT/INR  Recent Labs  10/24/16 1017  LABPROT 13.8  INR 1.07   Hepatitis Panel No results for input(s): HEPBSAG, HCVAB, HEPAIGM, HEPBIGM in the last 72 hours. C-Diff No results for input(s): CDIFFTOX in the last 72 hours. Fecal Lactopherrin No results for input(s): FECLLACTOFRN in the last 72 hours.  Studies/Results: Ct Head Wo Contrast  Result Date: 10/24/2016 CLINICAL DATA:  Ataxia.  History of stroke. EXAM: CT HEAD WITHOUT CONTRAST TECHNIQUE: Contiguous axial images were obtained from the base of the skull through the vertex without intravenous contrast. COMPARISON:  May 04, 2016 FINDINGS: Brain: No subdural, epidural, or subarachnoid  hemorrhage. Prominence of ventricles and sulci is stable. Cerebellum, brainstem, and basal cisterns are normal. White matter changes and lacunar infarcts are stable. No acute cortical ischemia or infarct is noted. No mass effect or midline shift. Vascular: No hyperdense vessel or unexpected calcification. Skull: Normal. Negative for fracture or focal lesion. Sinuses/Orbits: Significant increased opacification of the right greater than left maxillary sinus, ethmoid sinuses, frontal sinuses, and sphenoid sinuses. Mastoid air cells and middle ears are stable. Opacification of inferior left mastoid air cells remains. Other: None. IMPRESSION: 1. No acute intracranial abnormality or change. The ventricles remain dilated but stable. In the setting of ataxia, the possibility of normal pressure hydrocephalus should be entertained. There have been no intracranial changes in the interval however. 2. Increasing sinus disease as above. Electronically Signed   By: Dorise Bullion III M.D   On: 10/24/2016 10:58    Medications:  Scheduled: . baclofen  5 mg Oral QHS  . carvedilol  3.125 mg Oral BID WC  . fluticasone  2 spray Each Nare QHS  . guaiFENesin  600 mg Oral QHS  . loratadine  10 mg Oral Daily  . mometasone-formoterol  2 puff Inhalation BID  . montelukast  10 mg Oral QHS  . rOPINIRole  0.25 mg Oral QHS  . simvastatin  40 mg Oral QHS   Continuous: . sodium chloride 75 mL/hr at 10/24/16 2345    Assessment/Plan:  1) Diverticular bleed. 2) Dizziness.   As anticipated his HGB dropped with the bleeding.  Clinically he is stable.  It will be prudent to monitor him for another 24 hours before discharge.  His main concern is the dizziness that started before the bleeding.  He does not believe his dizziness was a symptom of his hematochezia.  Plan: 1) Follow HGB and transfuse as necessary. 2) Upon discharge he can follow up with Dr. Ardis Hughs to determine if he needs a repeat colonoscopy. 3) Work up for  dizziness per Hospitalist. 4)  GI to assume care tomorrow AM.  LOS: 1 day   Manie Bealer D 10/25/2016, 8:03 AM

## 2016-10-25 NOTE — Discharge Instructions (Signed)
Resume Plavix by next Saturday as long as you are not bleeding.      Benign Positional Vertigo Vertigo is the feeling that you or your surroundings are moving when they are not. Benign positional vertigo is the most common form of vertigo. The cause of this condition is not serious (is benign). This condition is triggered by certain movements and positions (is positional). This condition can be dangerous if it occurs while you are doing something that could endanger you or others, such as driving. What are the causes? In many cases, the cause of this condition is not known. It may be caused by a disturbance in an area of the inner ear that helps your brain to sense movement and balance. This disturbance can be caused by a viral infection (labyrinthitis), head injury, or repetitive motion. What increases the risk? This condition is more likely to develop in:  Women.  People who are 81 years of age or older.  What are the signs or symptoms? Symptoms of this condition usually happen when you move your head or your eyes in different directions. Symptoms may start suddenly, and they usually last for less than a minute. Symptoms may include:  Loss of balance and falling.  Feeling like you are spinning or moving.  Feeling like your surroundings are spinning or moving.  Nausea and vomiting.  Blurred vision.  Dizziness.  Involuntary eye movement (nystagmus).  Symptoms can be mild and cause only slight annoyance, or they can be severe and interfere with daily life. Episodes of benign positional vertigo may return (recur) over time, and they may be triggered by certain movements. Symptoms may improve over time. How is this diagnosed? This condition is usually diagnosed by medical history and a physical exam of the head, neck, and ears. You may be referred to a health care provider who specializes in ear, nose, and throat (ENT) problems (otolaryngologist) or a provider who specializes in  disorders of the nervous system (neurologist). You may have additional testing, including:  MRI.  A CT scan.  Eye movement tests. Your health care provider may ask you to change positions quickly while he or she watches you for symptoms of benign positional vertigo, such as nystagmus. Eye movement may be tested with an electronystagmogram (ENG), caloric stimulation, the Dix-Hallpike test, or the roll test.  An electroencephalogram (EEG). This records electrical activity in your brain.  Hearing tests.  How is this treated? Usually, your health care provider will treat this by moving your head in specific positions to adjust your inner ear back to normal. Surgery may be needed in severe cases, but this is rare. In some cases, benign positional vertigo may resolve on its own in 2-4 weeks. Follow these instructions at home: Safety  Move slowly.Avoid sudden body or head movements.  Avoid driving.  Avoid operating heavy machinery.  Avoid doing any tasks that would be dangerous to you or others if a vertigo episode would occur.  If you have trouble walking or keeping your balance, try using a cane for stability. If you feel dizzy or unstable, sit down right away.  Return to your normal activities as told by your health care provider. Ask your health care provider what activities are safe for you. General instructions  Take over-the-counter and prescription medicines only as told by your health care provider.  Avoid certain positions or movements as told by your health care provider.  Drink enough fluid to keep your urine clear or pale yellow.  Keep  all follow-up visits as told by your health care provider. This is important. Contact a health care provider if:  You have a fever.  Your condition gets worse or you develop new symptoms.  Your family or friends notice any behavioral changes.  Your nausea or vomiting gets worse.  You have numbness or a pins and needles  sensation. Get help right away if:  You have difficulty speaking or moving.  You are always dizzy.  You faint.  You develop severe headaches.  You have weakness in your legs or arms.  You have changes in your hearing or vision.  You develop a stiff neck.  You develop sensitivity to light. This information is not intended to replace advice given to you by your health care provider. Make sure you discuss any questions you have with your health care provider. Document Released: 11/10/2005 Document Revised: 07/11/2015 Document Reviewed: 05/28/2014 Elsevier Interactive Patient Education  [employer]. Please take all your medications with you for your next visit with your Primary MD. Please request your Primary MD to go over all hospital test results at the follow up. Please ask your Primary MD to get all Hospital records sent to his/her office.  If you experience worsening of your admission symptoms, develop shortness of breath, chest pain, suicidal or homicidal thoughts or a life threatening emergency, you must seek medical attention immediately by calling 911 or calling your MD.  Dennis Bast must read the complete instructions/literature along with all the possible adverse reactions/side effects for all the medicines you take including new medications that have been prescribed to you. Take new medicines after you have completely understood and accpet all the possible adverse reactions/side effects.   Do not drive when taking pain medications or sedatives.    Do not take more than prescribed Pain, Sleep and Anxiety Medications  If you have smoked or chewed Tobacco in the last 2 yrs please stop. Stop any regular alcohol and or recreational drug use.  Wear Seat belts while driving.

## 2016-10-25 NOTE — Discharge Summary (Signed)
Physician Discharge Summary  [name] XLK:[id number] DOB: July 19, 1931 DOA: 10/24/2016  PCP: Seward Carol, MD  Admit date: 10/24/2016 Discharge date: 10/25/2016  Admitted From: home  Disposition:  home   Recommendations for Outpatient Follow-up:  1. F/u on dizziness/ BPPV 2. Check Hb and anemia panel in 1-2 wks Discharge Condition:  stable   CODE STATUS:  DNR   Consultations:  GI- Dr Benson Norway    Discharge Diagnoses:  Principal Problem:   Lower GI bleed Active Problems:   Syncope   BPPV (benign paroxysmal positional vertigo)   Diabetes mellitus type 2, diet-controlled (HCC)   History of CVA (cerebrovascular accident)   Benign essential HTN   PAF (paroxysmal atrial fibrillation) (HCC)   TIA (transient ischemic attack)   GI bleed    Subjective: No symptoms of dizziness, weakness. Last bloody BM yesterday. 2 normal BMs since. No abdominal pain, no vomiting.   Brief Summary: [name] is a 83 y.o. male with a history of diverticulitis, colonic polyps, hypertension, diabetes type 2 (diet-controlled), GERD, history of CVA on Plavix, hyperlipidemia.   Patient had an episode of painless hematochezia this morning that consisted mostly of blood. He states that it was large amount. He had a subsequent episode in the emergency department that was witnessed. He subsequently passed out with EMS yesterday while they were getting him into a stretcher.  He complains also of dizziness. He mainly feels dizzy when he is getting out of or into the bed when he turns his head.   Hospital Course:  GI bleed/ syncope/ acute blood loss anemia - possibly diverticular- Plavix held - he has had 2 normal BMs since last night - Orthostatic vitals negative today despite 3 gm drop in Hgb - hold Plavix for 1 wk- he states he had  TIA earlier this week with trouble speaking and right sided numbness, would not want to hold Plavix for too long  "dizziness" - based on history given to me, it is intermittent  and mainly occurring when turning his head and getting in and out of bed- this is classic for  BPPV - has been evaluated by PT and B[facility] Caves test is + -underwent Epley's Maneuver  - he needs outpt vestibular treatment which I have discussed with him (earlier this morning) and ordered  CVA/TIA - see above about Plavix   Discharge Instructions  Discharge Instructions    Diet - low sodium heart healthy    Complete by:  As directed    Diet Carb Modified    Complete by:  As directed    Increase activity slowly    Complete by:  As directed      Allergies as of 10/25/2016      Reactions   Azithromycin Rash, Other (See Comments)   Possible reaction to Z-Pak      Medication List    STOP taking these medications   clopidogrel 75 MG tablet Commonly known as:  PLAVIX     TAKE these medications   acetaminophen 500 MG tablet Commonly known as:  TYLENOL Take 1,000 mg by mouth every 6 (six) hours as needed for headache (pain).   baclofen 10 MG tablet Commonly known as:  LIORESAL Take 0.5 tablets (5 mg total) by mouth at bedtime as needed for muscle spasms. What changed:  when to take this  reasons to take this   carvedilol 3.125 MG tablet Commonly known as:  COREG Take 1 tablet (3.125 mg total) by mouth 2 (two) times daily  with a meal.   cetirizine 10 MG tablet Commonly known as:  ZYRTEC Take 10 mg by mouth daily.   fluticasone 50 MCG/ACT nasal spray Commonly known as:  FLONASE Place 2 sprays into both nostrils at bedtime.   guaiFENesin 600 MG 12 hr tablet Commonly known as:  MUCINEX Take 600 mg by mouth at bedtime.   mometasone-formoterol 100-5 MCG/ACT Aero Commonly known as:  DULERA Inhale 2 puffs into the lungs 2 (two) times daily.   montelukast 10 MG tablet Commonly known as:  SINGULAIR Take 1 tablet (10 mg total) by mouth at bedtime.   PROAIR HFA 108 (90 Base) MCG/ACT inhaler Generic drug:  albuterol Inhale 2 puffs into the lungs every 6 (six) hours as  needed for wheezing or shortness of breath.   rOPINIRole 0.25 MG tablet Commonly known as:  REQUIP Take 1 tablet (0.25 mg total) by mouth at bedtime.   simvastatin 40 MG tablet Commonly known as:  ZOCOR Take 1 tablet (40 mg total) by mouth daily. What changed:  when to take this   zolpidem 10 MG tablet Commonly known as:  AMBIEN Take 10 mg by mouth at bedtime as needed for sleep.            Discharge Care Instructions        Start     Ordered   10/25/16 0000  baclofen (LIORESAL) 10 MG tablet  At bedtime PRN     10/25/16 1049   10/25/16 0000  Increase activity slowly     10/25/16 1049   10/25/16 0000  Diet - low sodium heart healthy     10/25/16 1049   10/25/16 0000  Diet Carb Modified     10/25/16 1049     Follow-up Information    Milus Banister, MD Follow up.   Specialty:  Gastroenterology Why:  call his office to let him know of your admission to the hospital and ask if he needs to perform and procedure on you.   Contact information: [address] [address] [phone]        Seward Carol, MD Follow up in 1 week(s).   Specialty:  Internal Medicine Why:  for a hospital follow up Contact information: [address] [place] [address] [phone]          Allergies  Allergen Reactions  . Azithromycin Rash and Other (See Comments)    Possible reaction to Z-Pak     Procedures/Studies:   Ct Head Wo Contrast  Result Date: 10/24/2016 CLINICAL DATA:  Ataxia.  History of stroke. EXAM: CT HEAD WITHOUT CONTRAST TECHNIQUE: Contiguous axial images were obtained from the base of the skull through the vertex without intravenous contrast. COMPARISON:  May 04, 2016 FINDINGS: Brain: No subdural, epidural, or subarachnoid hemorrhage. Prominence of ventricles and sulci is stable. Cerebellum, brainstem, and basal cisterns are normal. White matter changes and lacunar infarcts are stable. No acute cortical ischemia or infarct  is noted. No mass effect or midline shift. Vascular: No hyperdense vessel or unexpected calcification. Skull: Normal. Negative for fracture or focal lesion. Sinuses/Orbits: Significant increased opacification of the right greater than left maxillary sinus, ethmoid sinuses, frontal sinuses, and sphenoid sinuses. Mastoid air cells and middle ears are stable. Opacification of inferior left mastoid air cells remains. Other: None. IMPRESSION: 1. No acute intracranial abnormality or change. The ventricles remain dilated but stable. In the setting of ataxia, the possibility of normal pressure hydrocephalus should be entertained. There have  been no intracranial changes in the interval however. 2. Increasing sinus disease as above. Electronically Signed   By: Dorise Bullion III M.D   On: 10/24/2016 10:58       Discharge Exam: Vitals:   10/25/16 0831 10/25/16 1533  BP:  (!) 147/67  Pulse:  78  Resp:    Temp:  98.2 F (36.8 C)  SpO2: 96% 99%   Vitals:   10/25/16 0608 10/25/16 0610 10/25/16 0831 10/25/16 1533  BP: 116/73 (!) 117/55  (!) 147/67  Pulse: (!) 37 61  78  Resp: 18     Temp: 97.9 F (36.6 C)   98.2 F (36.8 C)  TempSrc: Oral   Oral  SpO2: 96%  96% 99%  Weight:      Height:        General: Pt is alert, awake, not in acute distress Cardiovascular: RRR, S1/S2 +, no rubs, no gallops Respiratory: CTA bilaterally, no wheezing, no rhonchi Abdominal: Soft, NT, ND, bowel sounds + Extremities: no edema, no cyanosis    The results of significant diagnostics from this hospitalization (including imaging, microbiology, ancillary and laboratory) are listed below for reference.     Microbiology: No results found for this or any previous visit (from the past 240 hour(s)).   Labs: BNP (last 3 results) No results for input(s): BNP in the last 8760 hours. Basic Metabolic Panel:  Recent Labs Lab 10/24/16 0959 10/25/16 0447  NA 136 139  K 4.3 3.9  CL 105 109  CO2 24 24  GLUCOSE 134*  116*  BUN 24* 23*  CREATININE 0.92 0.77  CALCIUM 8.4* 8.1*   Liver Function Tests:  Recent Labs Lab 10/24/16 1017 10/25/16 0447  AST 18 17  ALT 10* 10*  ALKPHOS 67 56  BILITOT 0.7 0.4  PROT 6.3* 5.1*  ALBUMIN 3.1* 2.8*   No results for input(s): LIPASE, AMYLASE in the last 168 hours. No results for input(s): AMMONIA in the last 168 hours. CBC:  Recent Labs Lab 10/24/16 0959 10/24/16 1510 10/25/16 0447  WBC 13.2*  --  13.1*  HGB 12.6* 12.0* 9.6*  HCT 39.7 38.0* 30.3*  MCV 87.4  --  87.3  PLT 339  --  293   Cardiac Enzymes: No results for input(s): CKTOTAL, CKMB, CKMBINDEX, TROPONINI in the last 168 hours. BNP: Invalid input(s): POCBNP CBG:  Recent Labs Lab 10/24/16 1628 10/24/16 2111 10/25/16 0759 10/25/16 1222 10/25/16 1648  GLUCAP 160* 132* 114* 126* 156*   D-Dimer No results for input(s): DDIMER in the last 72 hours. Hgb A1c No results for input(s): HGBA1C in the last 72 hours. Lipid Profile No results for input(s): CHOL, HDL, LDLCALC, TRIG, CHOLHDL, LDLDIRECT in the last 72 hours. Thyroid function studies No results for input(s): TSH, T4TOTAL, T3FREE, THYROIDAB in the last 72 hours.  Invalid input(s): FREET3 Anemia work up No results for input(s): VITAMINB12, FOLATE, FERRITIN, TIBC, IRON, RETICCTPCT in the last 72 hours. Urinalysis    Component Value Date/Time   COLORURINE YELLOW 05/04/2016 1123   APPEARANCEUR CLEAR 05/04/2016 1123   LABSPEC 1.011 05/04/2016 1123   PHURINE 7.0 05/04/2016 1123   GLUCOSEU NEGATIVE 05/04/2016 1123   HGBUR NEGATIVE 05/04/2016 [place] 05/04/2016 [place] 05/04/2016 1123   PROTEINUR NEGATIVE 05/04/2016 1123   UROBILINOGEN 0.2 12/22/2013 1210   NITRITE NEGATIVE 05/04/2016 1123   LEUKOCYTESUR NEGATIVE 05/04/2016 1123   Sepsis Labs Invalid input(s): PROCALCITONIN,  WBC,  LACTICIDVEN Microbiology No results found for this  or any previous visit (from the past 240  hour(s)).   Time coordinating discharge: Over 30 minutes  SIGNED:   Debbe Odea, MD  [facility] 10/25/2016, 5:54 PM Pager   If 7PM-7AM, please contact night-coverage [email or web address] Password [email or web address]

## 2016-10-25 NOTE — Evaluation (Signed)
Physical Therapy Evaluation Patient Details Name: [name] MRN: [id number] DOB: September 11, 1931 Today's Date: 10/25/2016   History of Present Illness   STEEL KERNEY is a 83 y.o. male with a history of diverticulitis, colonic polyps, hypertension, diabetes type 2 (diet-controlled), GERD, history of CVA on Plavix, hyperlipidemia. Patient admitted 10/24/16 with GIB, syncopal episode.  Also with dizziness that started earlier (Mon 10/19/16).    Clinical Impression  Patient presents with problems listed below.  Will benefit from acute PT to maximize functional independence prior to discharge.  Patient with GIB, and orthostatic BP testing was normal.  Provided Vestibular Rehab evaluation.  Patient tested positive for bilateral posterior canal canalithiasis, with Lt stronger response than Rt.  See Vestibular Assessment data in note above.   Treated Lt posterior canal BPPV using Epley canalith repositioning maneuver.  *MD:  Feel patient would benefit from Vestibular treatment in am to check Lt side and possibly treat Rt side. Vertigo is impacting patient's balance, mobility, and safety. Wife concerned about taking patient home today.  Request one more day stay for continued therapy.    Follow Up Recommendations Supervision for mobility/OOB;  Outpatient PT (for Vestibular Rehab)    Equipment Recommendations  None recommended by PT    Recommendations for Other Services       Precautions / Restrictions Precautions Precautions: Fall Precaution Comments: Patient with Lt hemiparesis from prior CVA Required Braces or Orthoses: Other Brace/Splint Other Brace/Splint: Has AFO at home - did not bring. Restrictions Weight Bearing Restrictions: No      Mobility  Bed Mobility Overal bed mobility: Needs Assistance Bed Mobility: Rolling;Sidelying to Sit;Sit to Supine Rolling: Min assist Sidelying to sit: Min assist   Sit to supine: Min assist   General bed mobility comments: Assist to move hips to  sidelying.  Assist to raise trunk to sitting and to bring LE's onto bed to return to sidelying/supine.    Transfers Overall transfer level: Needs assistance Equipment used: Rolling walker (2 wheeled) Transfers: Sit to/from Stand Sit to Stand: Min assist         General transfer comment: Slightly "dizzy" in stance for 10 seconds.  Use of RW for balance.  Ambulation/Gait Ambulation/Gait assistance: Min assist Ambulation Distance (Feet): 20 Feet Assistive device: Rolling walker (2 wheeled) Gait Pattern/deviations: Step-to pattern;Decreased stance time - left;Decreased step length - right;Decreased step length - left;Decreased stride length;Decreased dorsiflexion - left;Trunk flexed;Drifts right/left Gait velocity: decreased Gait velocity interpretation: Below normal speed for age/gender General Gait Details: Patient able to safely use RW.  Assist for balance/safety for gait.  Stairs            Wheelchair Mobility    Modified Rankin (Stroke Patients Only)       Balance Overall balance assessment: Needs assistance Sitting-balance support: Bilateral upper extremity supported;Feet supported Sitting balance-Leahy Scale: Fair Sitting balance - Comments: Initially required min assist for balance, but improved to min guard quickly.   Standing balance support: Bilateral upper extremity supported Standing balance-Leahy Scale: Poor Standing balance comment: Required use of RW for balance, along with external support.                             Pertinent Vitals/Pain Pain Assessment: No/denies pain    Home Living Family/patient expects to be discharged to:: Private residence [facility] in [place]) Living Arrangements: Spouse/significant other Available Help at Discharge: Family;Available 24 hours/day Type of Home: Independent living facility Home Access: Level  entry     Home Layout: One level Home Equipment: Grab bars - tub/shower;Hand held shower head;Walker -  4 wheels;Walker - 2 wheels;Shower seat;Wheelchair - manual      Prior Function Level of Independence: Independent with assistive device(s);Needs assistance   Gait / Transfers Assistance Needed: Uses rollator in house, and 2-wheeled RW outside of home (easier to transport)  ADL's / Homemaking Assistance Needed: Wife does housekeeping, meals.  Patient does own shower, dressing.        Hand Dominance   Dominant Hand: Right    Extremity/Trunk Assessment   Upper Extremity Assessment Upper Extremity Assessment: LUE deficits/detail LUE Deficits / Details: Strength 2+/5 overall.  Increased tone. LUE Coordination: decreased gross motor;decreased fine motor    Lower Extremity Assessment Lower Extremity Assessment: LLE deficits/detail LLE Deficits / Details: Strength grossly 4-/5 except DF at 3-/5. LLE Coordination: decreased gross motor    Cervical / Trunk Assessment Cervical / Trunk Assessment: Kyphotic;Other exceptions Cervical / Trunk Exceptions: Increased tone/stiffness throughout body.  Tenses body when moving.  Communication   Communication: No difficulties  Cognition Arousal/Alertness: Awake/alert Behavior During Therapy: WFL for tasks assessed/performed;Impulsive Overall Cognitive Status: Within Functional Limits for tasks assessed                                 General Comments: Wife states she may leave patient at home to get groceries now.  Feels more comfortable with his abilities since his CVA.      General Comments      Exercises     Assessment/Plan    PT Assessment Patient needs continued PT services  PT Problem List Decreased strength;Decreased balance;Decreased mobility;Decreased coordination;Impaired tone (Dizziness)       PT Treatment Interventions DME instruction;Gait training;Functional mobility training;Therapeutic exercise;Therapeutic activities;Balance training;Patient/family education (Vestibular Rehab)    PT Goals (Current goals  can be found in the Care Plan section)  Acute Rehab PT Goals Patient Stated Goal: to go home;  Wife wants patient to be safe. PT Goal Formulation: With patient/family Time For Goal Achievement: 11/01/16 Potential to Achieve Goals: Good    Frequency Min 5X/week   Barriers to discharge Decreased caregiver support Patient with decreased balance and increased fall risk.  Wife fearful of patient falling again.    Co-evaluation               AM-PAC PT "6 Clicks" Daily Activity  Outcome Measure Difficulty turning over in bed (including adjusting bedclothes, sheets and blankets)?: A Little Difficulty moving from lying on back to sitting on the side of the bed? : Unable Difficulty sitting down on and standing up from a chair with arms (e.g., wheelchair, bedside commode, etc,.)?: Unable Help needed moving to and from a bed to chair (including a wheelchair)?: A Little Help needed walking in hospital room?: A Little Help needed climbing 3-5 steps with a railing? : A Lot 6 Click Score: 13    End of Session Equipment Utilized During Treatment: Gait belt Activity Tolerance: Patient tolerated treatment well Patient left: in chair;with call bell/phone within reach;with family/visitor present Nurse Communication: Other (comment) (Request 1 more day for Vest treatment in am.) PT Visit Diagnosis: BPPV;Dizziness and giddiness (R42);Unsteadiness on feet (R26.81);Other abnormalities of gait and mobility (R26.89);Hemiplegia and hemiparesis BPPV - Right/Left : Left Hemiplegia - dominant/non-dominant: Non-dominant Hemiplegia - caused by: Cerebral infarction    Time: 5643-3295 PT Time Calculation (min) (ACUTE ONLY): 53 min   Charges:  PT Evaluation $PT Eval Moderate Complexity: 1 Mod PT Treatments $Therapeutic Activity: 23-37 mins $Canalith Rep Proc: 8-22 mins   PT G Codes:        Carita Pian. Sanjuana Kava, [facility] Acute Rehab Services Pager [place] 10/25/2016, 4:33 PM

## 2016-10-25 NOTE — Progress Notes (Signed)
Pt had frequent ventricular trigeminy, and occurrence of 5 beats PVC. Denied chest pain and SOB, VS stable. Kennon Holter, MD notified.

## 2016-10-25 NOTE — Progress Notes (Signed)
Physical Therapy  Vestibular Rehab Assessment Data   Positive for bil. Posterior canal BPPV   10/25/16 1643  Vestibular Assessment  General Observation Patient with some difficulty describing his dizziness.  Lt hemiparesis impacts mobility.  Symptom Behavior  Type of Dizziness Spinning  Frequency of Dizziness With head movement, esp forward/backward with position changes.  Several times during day.  Duration of Dizziness < 45 seconds  Aggravating Factors Looking up to the ceiling;Supine to sit;Sit to stand;Forward bending  Relieving Factors Head stationary;Rest  Occulomotor Exam  Occulomotor Alignment Normal  Spontaneous Absent  Gaze-induced Absent  Head shaking Horizontal Absent  Head Shaking Vertical Absent  Smooth Pursuits Intact  Saccades Intact (difficulty with vertical)  Vestibulo-Occular Reflex  VOR 1 Head Only (x 1 viewing) Normal  Positional Testing  Sidelying Test Sidelying Right;Sidelying Left  Horizontal Canal Testing Horizontal Canal Right;Horizontal Canal Left  Sidelying Right  Sidelying Right Duration 15 seconds  Sidelying Right Symptoms Upbeat, right rotatory nystagmus  Sidelying Left  Sidelying Left Duration 40 seconds  Sidelying Left Symptoms Upbeat, left rotatory nystagmus (Stronger response to Lt side)  Horizontal Canal Right  Horizontal Canal Right Symptoms Normal  Horizontal Canal Left  Horizontal Canal Left Symptoms Normal  Positional Sensitivities  Up from Left Hallpike 2  Positional Sensitivities Comments Up after Epley for Lt BPPV - patient became nauseated.  Carita Pian Sanjuana Kava, [place] Pager [phone]

## 2016-10-27 DIAGNOSIS — R42 Dizziness and giddiness: Secondary | ICD-10-CM | POA: Diagnosis not present

## 2016-10-27 DIAGNOSIS — D62 Acute posthemorrhagic anemia: Secondary | ICD-10-CM | POA: Diagnosis not present

## 2016-10-27 DIAGNOSIS — G459 Transient cerebral ischemic attack, unspecified: Secondary | ICD-10-CM | POA: Diagnosis not present

## 2016-10-27 DIAGNOSIS — K5791 Diverticulosis of intestine, part unspecified, without perforation or abscess with bleeding: Secondary | ICD-10-CM | POA: Diagnosis not present

## 2016-10-29 ENCOUNTER — Ambulatory Visit: Payer: Medicare HMO | Attending: Internal Medicine | Admitting: Physical Therapy

## 2016-10-29 VITALS — BP 145/82 | HR 69

## 2016-10-29 DIAGNOSIS — H8112 Benign paroxysmal vertigo, left ear: Secondary | ICD-10-CM | POA: Insufficient documentation

## 2016-10-29 DIAGNOSIS — R2681 Unsteadiness on feet: Secondary | ICD-10-CM

## 2016-10-29 DIAGNOSIS — R42 Dizziness and giddiness: Secondary | ICD-10-CM | POA: Insufficient documentation

## 2016-10-29 NOTE — Therapy (Signed)
[facility] [address] [place], [place], [address] Phone: [phone]   Fax:  [phone]  Physical Therapy Evaluation  Patient Details  Name: [name] MRN: [id number] Date of Birth: 05/28/1931 Referring Provider: Dr. Debbe Odea;; PCP Dr. Seward Carol  Encounter Date: 10/29/2016      PT End of Session - 10/29/16 2008    Visit Number 1   Number of Visits 4   Date for PT Re-Evaluation 11/28/16   Authorization Type [employer]na [facility]   Authorization Time Period 10-29-16 - 12-28-16   PT Start Time 1104   PT Stop Time 1155   PT Time Calculation (min) 51 min      Past Medical History:  Diagnosis Date  . Acute CVA (cerebrovascular accident) ([place]) 07/25/2015  . Asthma   . Benign essential HTN   . Cerebral infarction due to embolism of left middle cerebral artery ([place]) 02/03/2014  . Colitis   . Colon polyps   . Diabetes mellitus type 2, diet-controlled ([place]) 12/22/2013  . Diverticulosis   . GERD (gastroesophageal reflux disease)   . Hemorrhoids   . Hiatal hernia   . History of esophageal strciture   . HLD (hyperlipidemia)   . Osteoarthritis   . Proctitis   . Status post dilation of esophageal narrowing   . Stroke [facility])     Past Surgical History:  Procedure Laterality Date  . ABDOMINAL HERNIA REPAIR    . esophageal hernia    . INSERTION OF MESH  01/29/2012   Procedure: INSERTION OF MESH;  Surgeon: Gwenyth Ober, MD;  Location: [place];  Service: General;  Laterality: N/A;  . nissen    . SPLENECTOMY, TOTAL     nontraumatic rupture  . VENTRAL HERNIA REPAIR  01/29/2012    WITH MESH  . VENTRAL HERNIA REPAIR  01/29/2012   Procedure: HERNIA REPAIR VENTRAL ADULT;  Surgeon: Gwenyth Ober, MD;  Location: [facility];  Service: General;  Laterality: N/A;  open recurrent ventral hernia repair with mesh    Vitals:   10/29/16 1123  BP: (!) 145/82  Pulse: 69         Subjective Assessment - 10/29/16 1948    Subjective Pt  went to [facility] on Saturday, 10-24-16 with diverticulitis and had had syncopal episode due to loss of blood; also had vertigo - admitted on 10-24-16 and discharged home on 10-25-16; pt reports he turned on TV this am and had trouble with focusing - felt like things "were fuzzy"  Pt was treated for Rt BPPV with Epley's maneuver during hospitalization on 10-25-16   Patient is accompained by: Family member   Pertinent History CVA in Jan 2018; has used rollator since this CVA; diverticulitis; lower GI bleed in Dec. 2017; Rt BPPV on 10-24-16   Patient Stated Goals resolve the vertigo   Currently in Pain? No/denies            [facility] PT Assessment - 10/29/16 1102      Assessment   Medical Diagnosis BPPV   Referring Provider Dr. Debbe Odea;; PCP Dr. Seward Carol   Onset Date/Surgical Date 10/24/16   Prior Therapy 9-8- 10-25-16     Precautions   Precautions Fall     Balance Screen   Has the patient fallen in the past 6 months No   Has the patient had a decrease in activity level because of a fear of falling?  No   Is the patient reluctant to leave their  home because of a fear of falling?  No     Home Environment   Living Environment Private residence   Type of [place] One level     Prior Function   Level of Independence Independent with basic ADLs;Independent with household mobility with device;Independent with community mobility with device            Vestibular Assessment - 10/29/16 1123      Vestibular Assessment   General Observation Pt is an 83 yr old gentleman with recent onset of Rt BPPV on 10-24-16; pt was hospitalized with syncopal episode due to GI bleed due to diverticulosis. Pt had vertigo concurrent with this diagnosis while hospitalized and was treated with Epley maneuver for Rt BPPV which pt reports signficantly helped in reducing the vertigo.  Pt reports he was told he would receive tx the following day on Monday for Lt BPPV, but he was  discharged unexpectedly before he was able to receive tx for L BPPV.       Symptom Behavior   Type of Dizziness Spinning   Frequency of Dizziness with turning over in bed; intermittently during the day   Duration of Dizziness <5 seconds   Aggravating Factors Rolling to left   Relieving Factors Head stationary     Occulomotor Exam   Occulomotor Alignment Normal   Spontaneous Absent   Gaze-induced Absent   Smooth Pursuits Intact     Positional Testing   Dix-Hallpike Dix-Hallpike Right;Dix-Hallpike Left   Sidelying Test Sidelying Right;Sidelying Left   Horizontal Canal Testing Horizontal Canal Right;Horizontal Canal Left     Dix-Hallpike Right   Dix-Hallpike Right Duration none   Dix-Hallpike Right Symptoms No nystagmus     Dix-Hallpike Left   Dix-Hallpike Left Duration none   Dix-Hallpike Left Symptoms No nystagmus     Sidelying Right   Sidelying Right Duration none   Sidelying Right Symptoms No nystagmus     Sidelying Left   Sidelying Left Duration mild dizziness but does not describe as spinning   Sidelying Left Symptoms No nystagmus     Horizontal Canal Right   Horizontal Canal Right Duration none   Horizontal Canal Right Symptoms Normal     Horizontal Canal Left   Horizontal Canal Left Duration none   Horizontal Canal Left Symptoms Normal     Positional Sensitivities   Up from Right Hallpike Lightheadedness   Up from Left Hallpike Lightheadedness   Positional Sensitivities Comments pt reported more lighthedness with Lt sidelying to sitting than from Rt sidelying to sitting        Objective measurements completed on examination: See above findings.                  PT Education - 10/29/16 2007    Education provided Yes   Education Details habituation - sit to sidelying:  standing with EO on floor with head turns and standing on floor with EC   Person(s) Educated Patient;Spouse   Methods Explanation;Demonstration;Handout   Comprehension  Verbalized understanding;Returned demonstration             PT Long Term Goals - 10/29/16 2029      PT LONG TERM GOAL #1   Title Pt will report at least 50% improvement in vertigo with bed mobility.   Time 4   Period Weeks   Status New   Target Date 11/28/16     PT LONG TERM GOAL #2   Title Independent in  HEP for balance and habituation exercises.    Time 4   Period Weeks   Status New   Target Date 11/28/16                Plan - 10/29/16 2010    Clinical Impression Statement Pt is an 83 yr old gentleman with c/o mild light-headedness/dizziness and c/o "fuzziness" with an inability to focus in early am (for approx. 30") for past 2 mornings.  Pt has no nystagmus and no c/o vertigo with any positional testing; pt did report mild light-headedness with left sidelying to sitting position with symptoms > from Lt than from Rt sidelying.  Some of these symptoms could possibly be related to BP and pt's episode of GI bleed 5 days ago as no signs of BPPV noted during evaluation.  Rt BPPV treated during hospitalization appears to be resolved as of this time.  PMH includes diverticulitis, CVA in Jan 2018 with Lt hemiparesis,                                                                                                                               History and Personal Factors relevant to plan of care: h/o CVA in Dec. 2015:  CVA in Jan. 2018 with Lt hemiparesis:  HTN;  osteoarthritis; Diverticulitis   Clinical Presentation Evolving   Clinical Presentation due to: BPPV; multi-factorial vertigo?   Clinical Decision Making Moderate   Rehab Potential Good   PT Frequency 1x / week   PT Duration 2 weeks   PT Treatment/Interventions ADLs/Self Care Home Management;Vestibular;Neuromuscular re-education;Balance training;Therapeutic exercise;Patient/family education;Gait training   PT Next Visit Plan recheck vertigo - balance on floor exercises - EO and EC; habituation   PT Home Exercise Plan  see above   Consulted and Agree with Plan of Care Patient      Patient will benefit from skilled therapeutic intervention in order to improve the following deficits and impairments:  Dizziness, Decreased balance, Difficulty walking  Visit Diagnosis: BPPV (benign paroxysmal positional vertigo), left - Plan: PT plan of care cert/re-cert  Dizziness and giddiness - Plan: PT plan of care cert/re-cert  Unsteadiness on feet - Plan: PT plan of care cert/re-cert      G-Codes - 93/79/02 2033    Functional Assessment Tool Used (Outpatient Only) c/o dizziness with left sidelying to sitting   Functional Limitation Changing and maintaining body position   Changing and Maintaining Body Position Current Status (I0973) At least 20 percent but less than 40 percent impaired, limited or restricted   Changing and Maintaining Body Position Goal Status (Z3299) At least 1 percent but less than 20 percent impaired, limited or restricted       Problem List Patient Active Problem List   Diagnosis Date Noted  . GI bleed 10/25/2016  . BPPV (benign paroxysmal positional vertigo) 10/25/2016  . Lower GI bleed 10/24/2016  . Syncope 10/24/2016  . TIA (transient ischemic attack) 07/22/2016  . Benign  essential HTN   . PAF (paroxysmal atrial fibrillation) ([place])   . Stroke-like symptom 02/22/2016  . Leukocytosis 02/22/2016  . History of lower GI bleeding Dec 2017 02/01/2016  . Chronic anticoagulation 2/2 history of CVA 01/31/2016  . History of CVA (cerebrovascular accident) 01/31/2016  . Sinusitis, chronic 12/12/2015  . Cough variant asthma 10/31/2015  . Insomnia 07/25/2015  . HLD (hyperlipidemia) 02/03/2014  . Ulnar neuropathy at elbow of right upper extremity 02/03/2014  . Cervical radiculopathy 02/03/2014  . Seizures ([place])   . Diabetes mellitus type 2, diet-controlled ([place]) 12/22/2013  . Recurrent ventral hernia 12/15/2011  . History of esophageal strciture     Alda Lea, PT 10/29/2016,  8:41 PM  [facility] [address] [place], [place], [address] Phone: [phone]   Fax:  [phone]  Name: [name] MRN: [id number] Date of Birth: 1931/07/03

## 2016-10-29 NOTE — Patient Instructions (Signed)
Sit to Side-Lying    Sit on edge of bed. 1. Turn head 45 to right. 2. Maintain head position and lie down slowly on left side. Hold until symptoms subside. 3. Sit up slowly. Hold until symptoms subside. 4. Turn head 45 to left. 5. Maintain head position and lie down slowly on right side. Hold until symptoms subside. 6. Sit up slowly. Repeat sequence __5__ times per session. Do _3Feet Apart (Compliant Surface) Arm Motion - Eyes Closed    Stand on FLOOR surface: shoulder width apart. HOLD 15 SECS Repeat __3__ times per session. Do __3__ sessions per day.  Copyright  VHI. All rights reserved.    WITH EYES OPEN _LOOK AT TARGETS SIDE TO SIDE AND UP/DOWN - 10 REPS EACH DIRECTION 2-3 TIMES/DAY - STANDING ON FLOOR ONLY - NOT PILLOW

## 2016-11-04 ENCOUNTER — Other Ambulatory Visit: Payer: Self-pay | Admitting: Internal Medicine

## 2016-11-12 NOTE — Progress Notes (Signed)
PT Evaluation Addendum Late entry for missed G-code on 10/25/16 Based on review of documentation and goals    10/25/16 1610  PT Time Calculation  PT Start Time (ACUTE ONLY) 1514  PT Stop Time (ACUTE ONLY) 1607  PT Time Calculation (min) (ACUTE ONLY) 53 min  PT G-Codes **NOT FOR INPATIENT CLASS**  Functional Assessment Tool Used AM-PAC 6 Clicks Basic Mobility  Functional Limitation Mobility: Walking and moving around  Mobility: Walking and Moving Around Current Status (E7207) CK  Mobility: Walking and Moving Around Goal Status (K1828) CJ  PT General Charges  $$ ACUTE PT VISIT 1 Visit  PT Evaluation  $PT Eval Moderate Complexity 1 Mod  PT Treatments  $Therapeutic Activity 23-37 mins  $Canalith Rep Proc 8-22 mins   11/12/2016 [place], PT [phone]

## 2016-11-16 ENCOUNTER — Ambulatory Visit: Payer: Medicare HMO | Attending: Internal Medicine | Admitting: Physical Therapy

## 2016-11-16 VITALS — BP 129/78 | HR 66

## 2016-11-16 DIAGNOSIS — R2681 Unsteadiness on feet: Secondary | ICD-10-CM

## 2016-11-16 DIAGNOSIS — R42 Dizziness and giddiness: Secondary | ICD-10-CM

## 2016-11-17 NOTE — Therapy (Signed)
[facility] [address] [place], [place], [address] Phone: [phone]   Fax:  [phone]  Physical Therapy Treatment  Patient Details  Name: [name] MRN: [id number] Date of Birth: 27-May-1931 Referring Provider: Dr. Debbe Odea;; PCP Dr. Seward Carol  Encounter Date: 11/16/2016      PT End of Session - 11/17/16 1940    Visit Number 3   Number of Visits 4   Date for PT Re-Evaluation 11/28/16   Authorization Type Aetna [facility]   Authorization Time Period 10-29-16 - 12-28-16   PT Start Time 1315   PT Stop Time 1346   PT Time Calculation (min) 31 min      Past Medical History:  Diagnosis Date  . Acute CVA (cerebrovascular accident) ([place]) 07/25/2015  . Asthma   . Benign essential HTN   . Cerebral infarction due to embolism of left middle cerebral artery ([place]) 02/03/2014  . Colitis   . Colon polyps   . Diabetes mellitus type 2, diet-controlled ([place]) 12/22/2013  . Diverticulosis   . GERD (gastroesophageal reflux disease)   . Hemorrhoids   . Hiatal hernia   . History of esophageal strciture   . HLD (hyperlipidemia)   . Osteoarthritis   . Proctitis   . Status post dilation of esophageal narrowing   . Stroke [facility])     Past Surgical History:  Procedure Laterality Date  . ABDOMINAL HERNIA REPAIR    . esophageal hernia    . INSERTION OF MESH  01/29/2012   Procedure: INSERTION OF MESH;  Surgeon: Gwenyth Ober, MD;  Location: [facility];  Service: General;  Laterality: N/A;  . nissen    . SPLENECTOMY, TOTAL     nontraumatic rupture  . VENTRAL HERNIA REPAIR  01/29/2012    WITH MESH  . VENTRAL HERNIA REPAIR  01/29/2012   Procedure: HERNIA REPAIR VENTRAL ADULT;  Surgeon: Gwenyth Ober, MD;  Location: [facility] [place];  Service: General;  Laterality: N/A;  open recurrent ventral hernia repair with mesh    Vitals:   11/16/16 1326  BP: 129/78  Pulse: 66        Subjective Assessment - 11/17/16 1702    Subjective Pt  reports he has not had any vertigo since evaluation 2 weeks ago; states he has been doing balance exercises at home   Patient is accompained by: Family member   Patient Stated Goals resolve the vertigo   Currently in Pain? No/denies            NeuroRe-ed: (-) R and L Dix-Hallpike tests with no c/o vertigo and no nystagmus in test positions;  Pt reports slight light-headedness with return to upright sitting From test positions - quickly subsides after approx. 3 - 5 secs   Reviewed Brandt-Daroff exercises for HEP for self treatment of BPPV as needed Reviewed standing on floor - in corner - with EO and EC with head turns with both - 5-7 reps for horizontal and vertical            Vestibular Treatment/Exercise - 11/17/16 0001      Vestibular Treatment/Exercise   Habituation Exercises Legrand Como Daroff   Number of Reps  2   Symptom Description  no c/o vertigo            Balance Exercises - 11/17/16 1936      Balance Exercises: Standing   Standing Eyes Opened Wide ([place]);Head turns;Solid surface;5 reps   Standing Eyes  Closed Wide ([place]);Head turns;Solid surface;5 reps           PT Education - 11/17/16 1939    Education provided Yes   Education Details reviewed HEP - added head turns with EO and EC - standing on floor   Person(s) Educated Patient;Spouse   Methods Explanation;Demonstration   Comprehension Verbalized understanding;Returned demonstration             PT Long Term Goals - 11/17/16 1941      PT LONG TERM GOAL #1   Title Pt will report at least 50% improvement in vertigo with bed mobility.   Baseline met 11-16-16   Time 4   Period Weeks   Status Achieved     PT LONG TERM GOAL #2   Title Independent in HEP for balance and habituation exercises.    Baseline met 11-16-16   Time 4   Status Achieved               Plan - 11/17/16 1941    Clinical Impression Statement Pt has met LTG's #1 and 2;  BPPV has resolved at  present time with no c/o vertigo and no nystagmus noted with any positional testing.  Pt requests to be placed on hold for 30 days in case BPPV should re-occur - pt instructed to call for appt if he has any symptoms of vertigo within next month.   Rehab Potential Good   PT Frequency 1x / week   PT Duration 2 weeks   PT Treatment/Interventions ADLs/Self Care Home Management;Vestibular;Neuromuscular re-education;Balance training;Therapeutic exercise;Patient/family education;Gait training   PT Next Visit Plan hold for 30 days - reasssess if pt returns with re-occurrence  - otherwise will D/C   Consulted and Agree with Plan of Care Patient      Patient will benefit from skilled therapeutic intervention in order to improve the following deficits and impairments:  Dizziness, Decreased balance, Difficulty walking  Visit Diagnosis: Dizziness and giddiness  Unsteadiness on feet     Problem List Patient Active Problem List   Diagnosis Date Noted  . GI bleed 10/25/2016  . BPPV (benign paroxysmal positional vertigo) 10/25/2016  . Lower GI bleed 10/24/2016  . Syncope 10/24/2016  . TIA (transient ischemic attack) 07/22/2016  . Benign essential HTN   . PAF (paroxysmal atrial fibrillation) ([place])   . Stroke-like symptom 02/22/2016  . Leukocytosis 02/22/2016  . History of lower GI bleeding Dec 2017 02/01/2016  . Chronic anticoagulation 2/2 history of CVA 01/31/2016  . History of CVA (cerebrovascular accident) 01/31/2016  . Sinusitis, chronic 12/12/2015  . Cough variant asthma 10/31/2015  . Insomnia 07/25/2015  . HLD (hyperlipidemia) 02/03/2014  . Ulnar neuropathy at elbow of right upper extremity 02/03/2014  . Cervical radiculopathy 02/03/2014  . Seizures ([place])   . Diabetes mellitus type 2, diet-controlled ([place]) 12/22/2013  . Recurrent ventral hernia 12/15/2011  . History of esophageal strciture     Alda Lea, PT 11/17/2016, 7:46 PM  [facility] [address] [place], [place], [address] Phone: [phone]   Fax:  [phone]  Name: NAITHEN RIVENBURG MRN: [id number] Date of Birth: [birth year]

## 2016-11-23 DIAGNOSIS — M1712 Unilateral primary osteoarthritis, left knee: Secondary | ICD-10-CM | POA: Diagnosis not present

## 2016-11-25 ENCOUNTER — Encounter: Payer: Self-pay | Admitting: Neurology

## 2016-11-25 ENCOUNTER — Ambulatory Visit (INDEPENDENT_AMBULATORY_CARE_PROVIDER_SITE_OTHER): Payer: Medicare HMO | Admitting: Neurology

## 2016-11-25 ENCOUNTER — Encounter (INDEPENDENT_AMBULATORY_CARE_PROVIDER_SITE_OTHER): Payer: Self-pay

## 2016-11-25 VITALS — BP 136/68 | HR 60 | Ht 70.0 in | Wt 159.0 lb

## 2016-11-25 DIAGNOSIS — G3184 Mild cognitive impairment, so stated: Secondary | ICD-10-CM | POA: Diagnosis not present

## 2016-11-25 DIAGNOSIS — I6990 Unspecified sequelae of unspecified cerebrovascular disease: Secondary | ICD-10-CM

## 2016-11-25 MED ORDER — CLOPIDOGREL BISULFATE 75 MG PO TABS: 75.0000 mg | ORAL_TABLET | Freq: Every day | ORAL | 11 refills | Status: DC

## 2016-11-25 NOTE — Progress Notes (Signed)
[facility]  PATIENT: [name] DOB: February 08, 1932   REASON FOR VISIT: Follow-up for possible TIA last weekend and slurred speech right hand numbness for 30 minutes HISTORY FROM:    HISTORY OF PRESENT ILLNESS:UPDATE 07/22/2016.CM [name], 83 year old male returns for follow-up with possible TIA event over the weekend where he had slurred speech and right hand numbness for 30 minutes. He is currently on aspirin 325 mg daily. He is on Zocor for hyperlipidemia and is due to have labs next week his primary care. Blood pressure in the office today 149/78 he is continuing to get physical therapy 2 times a week for left lower extremity weakness since admission for stroke and 02/22/2016 . Carotid Doppler at that time 1-39% bilateral ICA stenosis negative MRA of the head MRI with right lateral thalamus and right posterior limb internal capsule infarct. He had another admission to the ER on 05/04/2016 for aphasia/ TIA. MRI without acute infarct. Marland Kitchen He lives in independent living at [facility]. He returns for reevaluation   HISTORY 07/2015 [name] had acute onset of dizziness on 07/25/15 and was admitted to [facility]. MRI showed tiny acute infarct in the superior left occipital lobe. CTA head and neck unremarkable. EF 45-50%, A1C 6.5, LDL not checked. His investigational meds were discontinued. He was put on plavix. Further embolic work up deferred as he would like to be discharged the 2nd day.   Discussed with pt and his wife that he had recurrent episodes of word finding difficulty. MRI on 12/22/13 showed acute infarct at left temporal cortical region. He was enrolled to RESPECT ESUS trial to compare ASA vs. pradaxa in 04/2014. However, he had another TIA episode with word finding difficulty and MRI negative. He was managed to be remained in the trial. This time his symptoms are different from previous, but MRI showed acute tiny infarct at left occipital lobe. He had 30 day cardiac monitoring  in the past with [name] was told negative for afib. During to recurrent episodes and embolic pattern, we can either put him on coumadin and INR 2-3 or continue plavix and check TEE and consider loop recorder. Pros and cons of either approach have been discussed with pt and wife. Pt more leaning towards coumadin and wife leaning toward the other. They would like to further discuss with PCP [name] and let me know. I will forward the note to [name] today and they will call him next week.   Update 11/25/2016 :  He returns for follow-up after last visit 4 months ago. He is accompanied by his wife. His abnormal further recurrent TIA or stroke symptoms. He had to stop Plavix for a week as he had some rectal bleed which was felt to be diverticular bleed. He has since resumed Plavix and is doing all right without further bleeding problems. He continues to walk with a walker due to stiffness and weakness in left leg as well as left knee pain which is bothersome. He recently got intra-articular injection by Dr.: Into his left knee. Uses a wheeled walker regularly. He states is careful and has not had any recent falls. He is complaining of mild short-term memory difficulties as well as double finding words and completing sentences. He does not participate in any mentally challenging activities.  REVIEW OF SYSTEMS: Full 14 system review of systems performed and notable only for those listed, all others are neg:   Speech difficulty, memory loss, restless legs, gait difficulty and all other  systems negative  ALLERGIES: Allergies  Allergen Reactions  . Azithromycin Rash and Other (See Comments)    Possible reaction to Z-Pak    HOME MEDICATIONS: Outpatient Medications Prior to Visit  Medication Sig Dispense Refill  . acetaminophen (TYLENOL) 500 MG tablet Take 1,000 mg by mouth every 6 (six) hours as needed for headache (pain).    Marland Kitchen albuterol (PROAIR HFA) 108 (90 Base) MCG/ACT inhaler Inhale 2 puffs into  the lungs every 6 (six) hours as needed for wheezing or shortness of breath.    . baclofen (LIORESAL) 10 MG tablet Take 0.5 tablets (5 mg total) by mouth at bedtime as needed for muscle spasms. 30 each 0  . carvedilol (COREG) 3.125 MG tablet Take 1 tablet (3.125 mg total) by mouth 2 (two) times daily with a meal. 60 tablet 1  . cetirizine (ZYRTEC) 10 MG tablet Take 10 mg by mouth daily.    . fluticasone (FLONASE) 50 MCG/ACT nasal spray Place 2 sprays into both nostrils at bedtime.   5  . guaiFENesin (MUCINEX) 600 MG 12 hr tablet Take 600 mg by mouth at bedtime.    . mometasone-formoterol (DULERA) 100-5 MCG/ACT AERO Inhale 2 puffs into the lungs 2 (two) times daily. 1 Inhaler 0  . montelukast (SINGULAIR) 10 MG tablet TAKE 1 TABLET (10 MG TOTAL) BY MOUTH AT BEDTIME. 30 tablet 10  . rOPINIRole (REQUIP) 0.25 MG tablet Take 1 tablet (0.25 mg total) by mouth at bedtime. 30 tablet 0  . simvastatin (ZOCOR) 40 MG tablet Take 1 tablet (40 mg total) by mouth daily. (Patient taking differently: Take 40 mg by mouth at bedtime. ) 30 tablet 3  . zolpidem (AMBIEN) 10 MG tablet Take 10 mg by mouth at bedtime as needed for sleep.     No facility-administered medications prior to visit.     PAST MEDICAL HISTORY: Past Medical History:  Diagnosis Date  . Acute CVA (cerebrovascular accident) ([place]) 07/25/2015  . Asthma   . Benign essential HTN   . Cerebral infarction due to embolism of left middle cerebral artery ([place]) 02/03/2014  . Colitis   . Colon polyps   . Diabetes mellitus type 2, diet-controlled ([place]) 12/22/2013  . Diverticulosis   . GERD (gastroesophageal reflux disease)   . Hemorrhoids   . Hiatal hernia   . History of esophageal strciture   . HLD (hyperlipidemia)   . Osteoarthritis   . Proctitis   . Status post dilation of esophageal narrowing   . Stroke [facility])     PAST SURGICAL HISTORY: Past Surgical History:  Procedure Laterality Date  . ABDOMINAL HERNIA REPAIR    . esophageal hernia    .  INSERTION OF MESH  01/29/2012   Procedure: INSERTION OF MESH;  Surgeon: Gwenyth Ober, MD;  Location: [facility];  Service: General;  Laterality: N/A;  . nissen    . SPLENECTOMY, TOTAL     nontraumatic rupture  . VENTRAL HERNIA REPAIR  01/29/2012    WITH MESH  . VENTRAL HERNIA REPAIR  01/29/2012   Procedure: HERNIA REPAIR VENTRAL ADULT;  Surgeon: Gwenyth Ober, MD;  Location: [facility];  Service: General;  Laterality: N/A;  open recurrent ventral hernia repair with mesh    FAMILY HISTORY: Family History  Problem Relation Age of Onset  . Cancer Father     SOCIAL HISTORY: Social History   Social History  . Marital status: Married    Spouse name: [name]  . Number of children: 2  . Years of  education: Bachelor's   Occupational History  . retired    Social History Main Topics  . Smoking status: Never Smoker  . Smokeless tobacco: Never Used  . Alcohol use 0.6 oz/week    1 Glasses of wine per week     Comment: daily  . Drug use: No  . Sexual activity: Not on file   Other Topics Concern  . Not on file   Social History Narrative   Patient is married and has 2 children.   Patient is right handed.   Patient has Bachelor's degree.   Patient drinks 2 cups daily.     PHYSICAL EXAM  Vitals:   11/25/16 1121  BP: 136/68  Pulse: 60  Weight: 159 lb (72.1 kg)  Height: 5\' 10"  (1.778 m)   Body mass index is 22.81 kg/m.  Generalized: Well developed, in no acute distress  Head: normocephalic and atraumatic,.   Neck: Supple, no carotid bruits  Cardiac: Regular rate rhythm, no murmur  Musculoskeletal: No deformity . Mild kyphosis  Neurological examination   Mentation: Alert oriented to time, place, history taking. Attention span and concentration appropriate. Recent and remote memory intact.  Follows all commands speech and language fluent. Diminished recall 2/3. Able to name 7 animals with 4 legs. Clock drawing 3/4.  Cranial nerve II-XII: Fundoscopic exam not donePupils were  equal round reactive to light extraocular movements were full, visual field were full on confrontational test. Facial sensation and strength were normal. hearing was intact to finger rubbing bilaterally. Uvula tongue midline. head turning and shoulder shrug were normal and symmetric.Tongue protrusion into cheek strength was normal. Motor: normal bulk and tone, full strength in the BUE, BLE, on the right, 3-5  left lower extremity with foot drop. AFO in place , 4 out of 5 left upper extremity. Mild spasticity left leg with increased tone. Sensory: normal and symmetric to light touch, in the upper and lower extremities Coordination: finger-nose-finger,  no dysmetria Reflexes: Brisker in the left upper and lower, plantar responses were flexor bilaterally. Gait and Station: Rising up from seated position with push off. Ambulated a short distance in the hall slow steady gait no difficulty with turns  DIAGNOSTIC DATA (LABS, IMAGING, TESTING) - I reviewed patient records, labs, notes, testing and imaging myself where available.  Lab Results  Component Value Date   WBC 13.1 (H) 10/25/2016   HGB 9.6 (L) 10/25/2016   HCT 30.3 (L) 10/25/2016   MCV 87.3 10/25/2016   PLT 293 10/25/2016      Component Value Date/Time   NA 139 10/25/2016 0447   K 3.9 10/25/2016 0447   CL 109 10/25/2016 0447   CO2 24 10/25/2016 0447   GLUCOSE 116 (H) 10/25/2016 0447   BUN 23 (H) 10/25/2016 0447   CREATININE 0.77 10/25/2016 0447   CREATININE 0.74 02/08/2012 1209   CALCIUM 8.1 (L) 10/25/2016 0447   PROT 5.1 (L) 10/25/2016 0447   ALBUMIN 2.8 (L) 10/25/2016 0447   AST 17 10/25/2016 0447   ALT 10 (L) 10/25/2016 0447   ALKPHOS 56 10/25/2016 0447   BILITOT 0.4 10/25/2016 0447   GFRNONAA >60 10/25/2016 0447   GFRAA >60 10/25/2016 0447   Lab Results  Component Value Date   CHOL 179 02/23/2016   HDL 59 02/23/2016   LDLCALC 99 02/23/2016   TRIG 103 02/23/2016   CHOLHDL 3.0 02/23/2016   Lab Results  Component  Value Date   HGBA1C 6.2 (H) 02/23/2016     ASSESSMENT AND PLAN  83 y.o. year old male  has a past medical history of Acute CVA (cerebrovascular accident) ([place]) (07/25/2015);  Benign essential HTN; Cerebral infarction due to embolism of left middle cerebral artery ([facility]) (02/03/2014);  HLD (hyperlipidemia); Osteoarthritis;  and Stroke (No Name).TIA in May 2018. Mild short-term memory and speech difficulties due to age-appropriate mild cognitive impairment   I had a long d/w patient and his wife about his remote stroke,left hemiparesis, mild cognitive impairment risk for recurrent stroke/TIAs, personally independently reviewed imaging studies and stroke evaluation results and answered questions.Continue Plavix   for secondary stroke prevention and maintain strict control of hypertension with blood pressure goal below 130/90, diabetes with hemoglobin A1c goal below 6.5% and lipids with LDL cholesterol goal below 70 mg/dL. I also advised the patient to  use of walker at all times for fall and safety precautions. I encouraged him to participate in mentally challenging activities like solving crossword puzzles, playing bridge, sudoku and to start taking fish oil 1200 mg daily for his mild cognitive impairment. We also discussed memory compensation strategies. Followup in the future with me in  6 months or call earlier if necessary  Antony Contras, MD [facility] [address], [place], [place] [address] [phone]

## 2016-11-25 NOTE — Patient Instructions (Signed)
I had a long d/w patient and his wife about his remote stroke,left hemiparesis, mild cognitive impairment risk for recurrent stroke/TIAs, personally independently reviewed imaging studies and stroke evaluation results and answered questions.Continue Plavix   for secondary stroke prevention and maintain strict control of hypertension with blood pressure goal below 130/90, diabetes with hemoglobin A1c goal below 6.5% and lipids with LDL cholesterol goal below 70 mg/dL. I also advised the patient to  use of walker at all times for fall and safety precautions. I encouraged him to participate in mentally challenging activities like solving crossword puzzles, playing bridge, sudoku and to start taking fish oil 1200 mg daily for his mild cognitive impairment. We also discussed memory compensation strategies. Followup in the future with me in  6 months or call earlier if necessary Memory Compensation Strategies  1. Use "WARM" strategy.  W= write it down  A= associate it  R= repeat it  M= make a mental note  2.   You can keep a Social worker.  Use a 3-ring notebook with sections for the following: calendar, important names and phone numbers,  medications, doctors' names/phone numbers, lists/reminders, and a section to journal what you did  each day.   3.    Use a calendar to write appointments down.  4.    Write yourself a schedule for the day.  This can be placed on the calendar or in a separate section of the Memory Notebook.  Keeping a  regular schedule can help memory.  5.    Use medication organizer with sections for each day or morning/evening pills.  You may need help loading it  6.    Keep a basket, or pegboard by the door.  Place items that you need to take out with you in the basket or on the pegboard.  You may also want to  include a message board for reminders.  7.    Use sticky notes.  Place sticky notes with reminders in a place where the task is performed.  For example: " turn off the   stove" placed by the stove, "lock the door" placed on the door at eye level, " take your medications" on  the bathroom mirror or by the place where you normally take your medications.  8.    Use alarms/timers.  Use while cooking to remind yourself to check on food or as a reminder to take your medicine, or as a  reminder to make a call, or as a reminder to perform another task, etc.

## 2016-11-27 DIAGNOSIS — E7800 Pure hypercholesterolemia, unspecified: Secondary | ICD-10-CM | POA: Diagnosis not present

## 2016-11-27 DIAGNOSIS — I639 Cerebral infarction, unspecified: Secondary | ICD-10-CM | POA: Diagnosis not present

## 2016-11-27 DIAGNOSIS — K5791 Diverticulosis of intestine, part unspecified, without perforation or abscess with bleeding: Secondary | ICD-10-CM | POA: Diagnosis not present

## 2016-11-27 DIAGNOSIS — G459 Transient cerebral ischemic attack, unspecified: Secondary | ICD-10-CM | POA: Diagnosis not present

## 2016-11-27 DIAGNOSIS — Z23 Encounter for immunization: Secondary | ICD-10-CM | POA: Diagnosis not present

## 2016-11-27 DIAGNOSIS — D62 Acute posthemorrhagic anemia: Secondary | ICD-10-CM | POA: Diagnosis not present

## 2016-11-27 DIAGNOSIS — E119 Type 2 diabetes mellitus without complications: Secondary | ICD-10-CM | POA: Diagnosis not present

## 2016-11-27 DIAGNOSIS — I10 Essential (primary) hypertension: Secondary | ICD-10-CM | POA: Diagnosis not present

## 2016-11-27 DIAGNOSIS — Z66 Do not resuscitate: Secondary | ICD-10-CM | POA: Diagnosis not present

## 2016-12-11 ENCOUNTER — Encounter (HOSPITAL_COMMUNITY): Payer: Self-pay | Admitting: Emergency Medicine

## 2016-12-11 ENCOUNTER — Inpatient Hospital Stay (HOSPITAL_COMMUNITY)
Admission: EM | Admit: 2016-12-11 | Discharge: 2016-12-13 | DRG: 378 | Disposition: A | Discharge status: Home / Self Care | Payer: Medicare HMO | Attending: Internal Medicine | Admitting: Internal Medicine

## 2016-12-11 DIAGNOSIS — Z79899 Other long term (current) drug therapy: Secondary | ICD-10-CM | POA: Diagnosis not present

## 2016-12-11 DIAGNOSIS — I5042 Chronic combined systolic (congestive) and diastolic (congestive) heart failure: Secondary | ICD-10-CM | POA: Diagnosis present

## 2016-12-11 DIAGNOSIS — K5791 Diverticulosis of intestine, part unspecified, without perforation or abscess with bleeding: Secondary | ICD-10-CM

## 2016-12-11 DIAGNOSIS — I110 Hypertensive heart disease with heart failure: Secondary | ICD-10-CM | POA: Diagnosis not present

## 2016-12-11 DIAGNOSIS — J449 Chronic obstructive pulmonary disease, unspecified: Secondary | ICD-10-CM | POA: Diagnosis not present

## 2016-12-11 DIAGNOSIS — D649 Anemia, unspecified: Secondary | ICD-10-CM

## 2016-12-11 DIAGNOSIS — Z9081 Acquired absence of spleen: Secondary | ICD-10-CM | POA: Diagnosis not present

## 2016-12-11 DIAGNOSIS — K219 Gastro-esophageal reflux disease without esophagitis: Secondary | ICD-10-CM | POA: Diagnosis present

## 2016-12-11 DIAGNOSIS — Z8673 Personal history of transient ischemic attack (TIA), and cerebral infarction without residual deficits: Secondary | ICD-10-CM

## 2016-12-11 DIAGNOSIS — K922 Gastrointestinal hemorrhage, unspecified: Secondary | ICD-10-CM | POA: Diagnosis not present

## 2016-12-11 DIAGNOSIS — Z66 Do not resuscitate: Secondary | ICD-10-CM | POA: Diagnosis present

## 2016-12-11 DIAGNOSIS — E119 Type 2 diabetes mellitus without complications: Secondary | ICD-10-CM | POA: Diagnosis present

## 2016-12-11 DIAGNOSIS — K5731 Diverticulosis of large intestine without perforation or abscess with bleeding: Secondary | ICD-10-CM | POA: Diagnosis not present

## 2016-12-11 DIAGNOSIS — Z7901 Long term (current) use of anticoagulants: Secondary | ICD-10-CM

## 2016-12-11 DIAGNOSIS — R58 Hemorrhage, not elsewhere classified: Secondary | ICD-10-CM | POA: Diagnosis not present

## 2016-12-11 DIAGNOSIS — K625 Hemorrhage of anus and rectum: Secondary | ICD-10-CM | POA: Diagnosis not present

## 2016-12-11 DIAGNOSIS — Z7902 Long term (current) use of antithrombotics/antiplatelets: Secondary | ICD-10-CM

## 2016-12-11 DIAGNOSIS — E785 Hyperlipidemia, unspecified: Secondary | ICD-10-CM | POA: Diagnosis not present

## 2016-12-11 DIAGNOSIS — Z881 Allergy status to other antibiotic agents status: Secondary | ICD-10-CM | POA: Diagnosis not present

## 2016-12-11 LAB — COMPREHENSIVE METABOLIC PANEL
ALT: 10 U/L — ABNORMAL LOW (ref 17–63)
ANION GAP: 6 (ref 5–15)
AST: 21 U/L (ref 15–41)
Albumin: 3.1 g/dL — ABNORMAL LOW (ref 3.5–5.0)
Alkaline Phosphatase: 55 U/L (ref 38–126)
BILIRUBIN TOTAL: 0.7 mg/dL (ref 0.3–1.2)
BUN: 17 mg/dL (ref 6–20)
CHLORIDE: 107 mmol/L (ref 101–111)
CO2: 24 mmol/L (ref 22–32)
Calcium: 8 mg/dL — ABNORMAL LOW (ref 8.9–10.3)
Creatinine, Ser: 0.79 mg/dL (ref 0.61–1.24)
GFR calc Af Amer: >60 mL/min (ref >60)
Glucose, Bld: 108 mg/dL — ABNORMAL HIGH (ref 65–99)
POTASSIUM: 4 mmol/L (ref 3.5–5.1)
Sodium: 137 mmol/L (ref 135–145)
TOTAL PROTEIN: 5.4 g/dL — AB (ref 6.5–8.1)

## 2016-12-11 LAB — HEMOGLOBIN
Hemoglobin: 8.2 g/dL — ABNORMAL LOW (ref 13.0–17.0)
Hemoglobin: 6.7 g/dL — CL (ref 13.0–17.0)

## 2016-12-11 LAB — CBC
HEMATOCRIT: 27 % — AB (ref 39.0–52.0)
Hemoglobin: 8.2 g/dL — ABNORMAL LOW (ref 13.0–17.0)
MCH: 24.3 pg — ABNORMAL LOW (ref 26.0–34.0)
MCHC: 30.4 g/dL (ref 30.0–36.0)
MCV: 80.1 fL (ref 78.0–100.0)
PLATELETS: 489 10*3/uL — AB (ref 150–400)
RBC: 3.37 MIL/uL — ABNORMAL LOW (ref 4.22–5.81)
RDW: 15.7 % — AB (ref 11.5–15.5)
WBC: 10.7 10*3/uL — ABNORMAL HIGH (ref 4.0–10.5)

## 2016-12-11 LAB — PREPARE RBC (CROSSMATCH)

## 2016-12-11 MED ORDER — CARVEDILOL 3.125 MG PO TABS
3.1250 mg | ORAL_TABLET | Freq: Two times a day (BID) | ORAL | Status: DC
  Administered 2016-12-12 – 2016-12-13 (×4): 3.125 mg via ORAL
  Filled 2016-12-11 (×4): qty 1

## 2016-12-11 MED ORDER — ACETAMINOPHEN 325 MG PO TABS: 650.0000 mg | ORAL_TABLET | Freq: Four times a day (QID) | ORAL | Status: DC | PRN

## 2016-12-11 MED ORDER — OMEGA-3-ACID ETHYL ESTERS 1 G PO CAPS
1.0000 g | ORAL_CAPSULE | Freq: Every day | ORAL | Status: DC
  Administered 2016-12-12 – 2016-12-13 (×2): 1 g via ORAL
  Filled 2016-12-11 (×2): qty 1

## 2016-12-11 MED ORDER — TRAZODONE HCL 50 MG PO TABS: 50.0000 mg | ORAL_TABLET | Freq: Every evening | ORAL | Status: DC | PRN

## 2016-12-11 MED ORDER — PANTOPRAZOLE SODIUM 40 MG IV SOLR
40.0000 mg | Freq: Two times a day (BID) | INTRAVENOUS | Status: DC
  Administered 2016-12-11 – 2016-12-13 (×4): 40 mg via INTRAVENOUS
  Filled 2016-12-11 (×4): qty 40

## 2016-12-11 MED ORDER — SODIUM CHLORIDE 0.9% FLUSH
3.0000 mL | Freq: Two times a day (BID) | INTRAVENOUS | Status: DC
  Administered 2016-12-12 – 2016-12-13 (×3): 3 mL via INTRAVENOUS

## 2016-12-11 MED ORDER — HYDRALAZINE HCL 20 MG/ML IJ SOLN: 10.0000 mg | Freq: Four times a day (QID) | INTRAMUSCULAR | Status: DC | PRN

## 2016-12-11 MED ORDER — FLUTICASONE PROPIONATE 50 MCG/ACT NA SUSP
1.0000 | Freq: Every day | NASAL | Status: DC
  Administered 2016-12-11 – 2016-12-12 (×3): 1 via NASAL
  Filled 2016-12-11 (×2): qty 16

## 2016-12-11 MED ORDER — LORATADINE 10 MG PO TABS
10.0000 mg | ORAL_TABLET | Freq: Every day | ORAL | Status: DC
  Administered 2016-12-12 – 2016-12-13 (×2): 10 mg via ORAL
  Filled 2016-12-11 (×2): qty 1

## 2016-12-11 MED ORDER — SODIUM CHLORIDE 0.9 % IV SOLN
INTRAVENOUS | Status: DC
  Administered 2016-12-11 – 2016-12-13 (×3): via INTRAVENOUS

## 2016-12-11 MED ORDER — ACETAMINOPHEN 500 MG PO TABS: 1000.0000 mg | ORAL_TABLET | Freq: Four times a day (QID) | ORAL | Status: DC | PRN

## 2016-12-11 MED ORDER — ALBUTEROL SULFATE (2.5 MG/3ML) 0.083% IN NEBU: 2.5000 mg | INHALATION_SOLUTION | RESPIRATORY_TRACT | Status: DC | PRN

## 2016-12-11 MED ORDER — ALBUTEROL SULFATE HFA 108 (90 BASE) MCG/ACT IN AERS: 2.0000 | INHALATION_SPRAY | Freq: Four times a day (QID) | RESPIRATORY_TRACT | Status: DC | PRN

## 2016-12-11 MED ORDER — PEG-KCL-NACL-NASULF-NA ASC-C 100 G PO SOLR: 1.0000 | Freq: Once | ORAL | Status: DC

## 2016-12-11 MED ORDER — SODIUM CHLORIDE 0.9 % IV SOLN: 250.0000 mL | INTRAVENOUS | Status: DC | PRN

## 2016-12-11 MED ORDER — POLYETHYLENE GLYCOL 3350 17 G PO PACK: 17.0000 g | PACK | Freq: Every day | ORAL | Status: DC | PRN

## 2016-12-11 MED ORDER — ZOLPIDEM TARTRATE 5 MG PO TABS
5.0000 mg | ORAL_TABLET | Freq: Every evening | ORAL | Status: DC | PRN
  Administered 2016-12-11 – 2016-12-12 (×2): 5 mg via ORAL
  Filled 2016-12-11 (×2): qty 1

## 2016-12-11 MED ORDER — MOMETASONE FURO-FORMOTEROL FUM 100-5 MCG/ACT IN AERO
2.0000 | INHALATION_SPRAY | Freq: Two times a day (BID) | RESPIRATORY_TRACT | Status: DC
  Filled 2016-12-11 (×2): qty 8.8

## 2016-12-11 MED ORDER — SODIUM CHLORIDE 0.9 % IV BOLUS (SEPSIS)
250.0000 mL | Freq: Once | INTRAVENOUS | Status: AC
  Administered 2016-12-11: 250 mL via INTRAVENOUS

## 2016-12-11 MED ORDER — SENNA 8.6 MG PO TABS
1.0000 | ORAL_TABLET | Freq: Two times a day (BID) | ORAL | Status: DC
  Administered 2016-12-12 – 2016-12-13 (×2): 8.6 mg via ORAL
  Filled 2016-12-11 (×3): qty 1

## 2016-12-11 MED ORDER — SODIUM CHLORIDE 0.9% FLUSH: 3.0000 mL | INTRAVENOUS | Status: DC | PRN

## 2016-12-11 MED ORDER — ACETAMINOPHEN 650 MG RE SUPP: 650.0000 mg | Freq: Four times a day (QID) | RECTAL | Status: DC | PRN

## 2016-12-11 MED ORDER — GUAIFENESIN ER 600 MG PO TB12
600.0000 mg | ORAL_TABLET | Freq: Every day | ORAL | Status: DC
  Administered 2016-12-12 – 2016-12-13 (×2): 600 mg via ORAL
  Filled 2016-12-11 (×2): qty 1

## 2016-12-11 MED ORDER — PEG-KCL-NACL-NASULF-NA ASC-C 100 G PO SOLR
0.5000 | Freq: Once | ORAL | Status: AC
  Administered 2016-12-11: 100 g via ORAL

## 2016-12-11 MED ORDER — ROPINIROLE HCL 0.25 MG PO TABS
0.2500 mg | ORAL_TABLET | Freq: Every day | ORAL | Status: DC
  Administered 2016-12-11 – 2016-12-12 (×3): 0.25 mg via ORAL
  Filled 2016-12-11 (×4): qty 1

## 2016-12-11 MED ORDER — PEG-KCL-NACL-NASULF-NA ASC-C 100 G PO SOLR
0.5000 | Freq: Once | ORAL | Status: AC
  Administered 2016-12-11: 100 g via ORAL
  Filled 2016-12-11: qty 1

## 2016-12-11 MED ORDER — SIMVASTATIN 40 MG PO TABS
40.0000 mg | ORAL_TABLET | Freq: Every day | ORAL | Status: DC
  Administered 2016-12-11 – 2016-12-12 (×2): 40 mg via ORAL
  Filled 2016-12-11 (×2): qty 1

## 2016-12-11 MED ORDER — ONDANSETRON HCL 4 MG PO TABS: 4.0000 mg | ORAL_TABLET | Freq: Four times a day (QID) | ORAL | Status: DC | PRN

## 2016-12-11 MED ORDER — ONDANSETRON HCL 4 MG/2ML IJ SOLN: 4.0000 mg | Freq: Four times a day (QID) | INTRAMUSCULAR | Status: DC | PRN

## 2016-12-11 MED ORDER — SODIUM CHLORIDE 0.9 % IV SOLN
Freq: Once | INTRAVENOUS | Status: AC
  Administered 2016-12-12: via INTRAVENOUS

## 2016-12-11 MED ORDER — MONTELUKAST SODIUM 10 MG PO TABS
10.0000 mg | ORAL_TABLET | Freq: Every day | ORAL | Status: DC
  Administered 2016-12-11 – 2016-12-12 (×2): 10 mg via ORAL
  Filled 2016-12-11 (×2): qty 1

## 2016-12-11 NOTE — ED Notes (Addendum)
Pt had a bowel movement with large amount of bright red blood and clots. RN and PA notified. Pt ambulated to restroom with walker and stand by assist. Pt had a steady gait and tolerated well.

## 2016-12-11 NOTE — Progress Notes (Signed)
CRITICAL VALUE ALERT  Critical Value:  Hemoglobin 6.7  Date & Time Notied:  12/11/16  Provider Notified: M. Lynch   Orders Received/Actions taken: 1 Unit of blood to be transfused ordered

## 2016-12-11 NOTE — Consult Note (Signed)
Consultation  Referring Provider: Dr. Alvino Chapel     Primary Care Physician:  Seward Carol, MD Primary Gastroenterologist: Dr. Ardis Hughs        Reason for Consultation: Hematochezia             HPI:   [name] is a 83 y.o. male with history of CVA on Plavix as well as diabetes and multiple others listed below, who presented to the ED today with a complaint of hematochezia.    Per review of chart patient was seen in the hospital 10/24/16 through 10/25/16 for the same complaint.  Patient was consulted by Dr. Benson Norway at that time.  His last colonoscopy was noted on 08/08/07 by Dr. Ardis Hughs with pandiverticulosis.  At that admission patient was dizzy which "had not happened with previous diverticular bleeds" per the family.  Patient's bleeding stopped spontaneously.  His hemoglobin drifted from 12.6 down to 9.6 by time of discharge.  It was recommended he follow with our office to consider repeat colonoscopy as outpatient after discharge.   Today, the patient is accompanied by his wife who does help with his history.  They explain that around 930 this morning patient had a bowel movement with a large amount of bright red blood and some clots in it.  This occurred again at 1030.  They then came to the ER.  Patient had one further bloody bowel movement at around 130 this afternoon.  (They have left this in the toilet for me to view, bright red blood with clots and stool).  Patient denies any abdominal pain or symptoms of dizziness or syncope.  He does explain that he has had at least 4 episodes of diverticular bleeding over the past 9 years or so, the last 2-3 within the past 2 years.  His last colonoscopy was in 2009 with Dr. Ardis Hughs.     He does add that chronically, he has some increase in intestinal gas at times which seems to "come in cycles", and sometimes "when I pass gas I also pass some liquid stool".   Patient's medical history is positive for strokes and he is maintained on Plavix.  His last dose was  this morning. Patient denies fever, chills, change in bowel habits prior to bleeding, melena, anorexia, shortness of breath, heartburn, reflux, abdominal or rectal pain.  Labs: Hgb 8.2 today  Past Medical History:  Diagnosis Date  . Acute CVA (cerebrovascular accident) ([place]) 07/25/2015  . Asthma   . Benign essential HTN   . Cerebral infarction due to embolism of left middle cerebral artery ([place]) 02/03/2014  . Colitis   . Colon polyps   . Diabetes mellitus type 2, diet-controlled ([place]) 12/22/2013  . Diverticulosis   . GERD (gastroesophageal reflux disease)   . Hemorrhoids   . Hiatal hernia   . History of esophageal strciture   . HLD (hyperlipidemia)   . Osteoarthritis   . Proctitis   . Status post dilation of esophageal narrowing   . Stroke [facility])     Past Surgical History:  Procedure Laterality Date  . ABDOMINAL HERNIA REPAIR    . COLONOSCOPY     multiple  . ESOPHAGOGASTRODUODENOSCOPY     multiple  . INSERTION OF MESH  01/29/2012   Procedure: INSERTION OF MESH;  Surgeon: Gwenyth Ober, MD;  Location: [facility];  Service: General;  Laterality: N/A;  . NISSEN FUNDOPLICATION    . SPLENECTOMY, TOTAL     nontraumatic rupture  . VENTRAL HERNIA REPAIR  01/29/2012    WITH MESH  . VENTRAL HERNIA REPAIR  01/29/2012   Procedure: HERNIA REPAIR VENTRAL ADULT;  Surgeon: Gwenyth Ober, MD;  Location: [facility];  Service: General;  Laterality: N/A;  open recurrent ventral hernia repair with mesh    Family History  Problem Relation Age of Onset  . Cancer Father      Social History  Substance Use Topics  . Smoking status: Never Smoker  . Smokeless tobacco: Never Used  . Alcohol use 0.6 oz/week    1 Glasses of wine per week     Comment: daily    Prior to Admission medications   Medication Sig Start Date End Date Taking? Authorizing Provider  acetaminophen (TYLENOL) 500 MG tablet Take 1,000 mg by mouth every 6 (six) hours as needed for headache (pain).   Yes [provider]    albuterol (PROAIR HFA) 108 (90 Base) MCG/ACT inhaler Inhale 2 puffs into the lungs every 6 (six) hours as needed for wheezing or shortness of breath.   Yes [provider]  baclofen (LIORESAL) 10 MG tablet Take 0.5 tablets (5 mg total) by mouth at bedtime as needed for muscle spasms. Patient taking differently: Take 10 mg by mouth at bedtime as needed for muscle spasms.  10/25/16  Yes Debbe Odea, MD  carvedilol (COREG) 3.125 MG tablet Take 1 tablet (3.125 mg total) by mouth 2 (two) times daily with a meal. 02/26/16  Yes Amin, Ankit Chirag, MD  cetirizine (ZYRTEC) 10 MG tablet Take 10 mg by mouth daily.   Yes [provider]  clopidogrel (PLAVIX) 75 MG tablet Take 1 tablet (75 mg total) by mouth daily. 11/25/16  Yes Garvin Fila, MD  fluticasone (FLONASE) 50 MCG/ACT nasal spray Place 1 spray into both nostrils at bedtime.  06/18/14  Yes [provider]  guaiFENesin (MUCINEX) 600 MG 12 hr tablet Take 600 mg by mouth daily.    Yes [provider]  mometasone-formoterol (DULERA) 100-5 MCG/ACT AERO Inhale 2 puffs into the lungs 2 (two) times daily. 12/12/15  Yes Tanda Rockers, MD  montelukast (SINGULAIR) 10 MG tablet TAKE 1 TABLET (10 MG TOTAL) BY MOUTH AT BEDTIME. 11/04/16  Yes Tanda Rockers, MD  Omega-3 Fatty Acids (FISH OIL) 500 MG CAPS Take 500 mg by mouth daily.   Yes [provider]  rOPINIRole (REQUIP) 0.25 MG tablet Take 1 tablet (0.25 mg total) by mouth at bedtime. 02/26/16  Yes Amin, Jeanella Flattery, MD  simvastatin (ZOCOR) 40 MG tablet Take 1 tablet (40 mg total) by mouth daily. Patient taking differently: Take 40 mg by mouth at bedtime.  02/26/16  Yes Amin, Ankit Chirag, MD  zolpidem (AMBIEN) 10 MG tablet Take 5-10 mg by mouth at bedtime as needed for sleep.    Yes [provider]    Current Facility-Administered Medications  Medication Dose Route Frequency Provider Last Rate Last Dose  . 0.9 %  sodium chloride infusion  250 mL  Intravenous PRN Emokpae, Courage, MD      . 0.9 %  sodium chloride infusion   Intravenous Continuous Emokpae, Courage, MD      . acetaminophen (TYLENOL) tablet 650 mg  650 mg Oral Q6H PRN Emokpae, Courage, MD       Or  . acetaminophen (TYLENOL) suppository 650 mg  650 mg Rectal Q6H PRN Emokpae, Courage, MD      . acetaminophen (TYLENOL) tablet 1,000 mg  1,000 mg Oral Q6H PRN Roxan Hockey, MD      .  albuterol (PROVENTIL HFA;VENTOLIN HFA) 108 (90 Base) MCG/ACT inhaler 2 puff  2 puff Inhalation Q6H PRN Emokpae, Courage, MD      . albuterol (PROVENTIL) (2.5 MG/3ML) 0.083% nebulizer solution 2.5 mg  2.5 mg Nebulization Q2H PRN Emokpae, Courage, MD      . carvedilol (COREG) tablet 3.125 mg  3.125 mg Oral BID WC Emokpae, Courage, MD      . fluticasone (FLONASE) 50 MCG/ACT nasal spray 1 spray  1 spray Each Nare QHS Emokpae, Courage, MD      . guaiFENesin (MUCINEX) 12 hr tablet 600 mg  600 mg Oral Daily Emokpae, Courage, MD      . hydrALAZINE (APRESOLINE) injection 10 mg  10 mg Intravenous Q6H PRN Emokpae, Courage, MD      . loratadine (CLARITIN) tablet 10 mg  10 mg Oral Daily Emokpae, Courage, MD      . mometasone-formoterol (DULERA) 100-5 MCG/ACT inhaler 2 puff  2 puff Inhalation BID Emokpae, Courage, MD      . montelukast (SINGULAIR) tablet 10 mg  10 mg Oral QHS Emokpae, Courage, MD      . omega-3 acid ethyl esters (LOVAZA) capsule 1 g  1 g Oral Daily Emokpae, Courage, MD      . ondansetron (ZOFRAN) tablet 4 mg  4 mg Oral Q6H PRN Emokpae, Courage, MD       Or  . ondansetron (ZOFRAN) injection 4 mg  4 mg Intravenous Q6H PRN Emokpae, Courage, MD      . pantoprazole (PROTONIX) injection 40 mg  40 mg Intravenous Q12H Emokpae, Courage, MD      . peg 3350 powder (MOVIPREP) kit 100 g  0.5 kit Oral Once Elnora Morrison, MD      . polyethylene glycol (MIRALAX / GLYCOLAX) packet 17 g  17 g Oral Daily PRN Emokpae, Courage, MD      . rOPINIRole (REQUIP) tablet 0.25 mg  0.25 mg Oral QHS Emokpae, Courage, MD       . senna (SENOKOT) tablet 8.6 mg  1 tablet Oral BID Emokpae, Courage, MD      . simvastatin (ZOCOR) tablet 40 mg  40 mg Oral QHS Emokpae, Courage, MD      . sodium chloride flush (NS) 0.9 % injection 3 mL  3 mL Intravenous Q12H Emokpae, Courage, MD      . sodium chloride flush (NS) 0.9 % injection 3 mL  3 mL Intravenous PRN Emokpae, Courage, MD      . traZODone (DESYREL) tablet 50 mg  50 mg Oral QHS PRN Emokpae, Courage, MD      . zolpidem (AMBIEN) tablet 5 mg  5 mg Oral QHS PRN Roxan Hockey, MD       Current Outpatient Prescriptions  Medication Sig Dispense Refill  . acetaminophen (TYLENOL) 500 MG tablet Take 1,000 mg by mouth every 6 (six) hours as needed for headache (pain).    Marland Kitchen albuterol (PROAIR HFA) 108 (90 Base) MCG/ACT inhaler Inhale 2 puffs into the lungs every 6 (six) hours as needed for wheezing or shortness of breath.    . baclofen (LIORESAL) 10 MG tablet Take 0.5 tablets (5 mg total) by mouth at bedtime as needed for muscle spasms. (Patient taking differently: Take 10 mg by mouth at bedtime as needed for muscle spasms. ) 30 each 0  . carvedilol (COREG) 3.125 MG tablet Take 1 tablet (3.125 mg total) by mouth 2 (two) times daily with a meal. 60 tablet 1  . cetirizine (ZYRTEC) 10 MG tablet Take  10 mg by mouth daily.    . clopidogrel (PLAVIX) 75 MG tablet Take 1 tablet (75 mg total) by mouth daily. 30 tablet 11  . fluticasone (FLONASE) 50 MCG/ACT nasal spray Place 1 spray into both nostrils at bedtime.   5  . guaiFENesin (MUCINEX) 600 MG 12 hr tablet Take 600 mg by mouth daily.     . mometasone-formoterol (DULERA) 100-5 MCG/ACT AERO Inhale 2 puffs into the lungs 2 (two) times daily. 1 Inhaler 0  . montelukast (SINGULAIR) 10 MG tablet TAKE 1 TABLET (10 MG TOTAL) BY MOUTH AT BEDTIME. 30 tablet 10  . Omega-3 Fatty Acids (FISH OIL) 500 MG CAPS Take 500 mg by mouth daily.    Marland Kitchen rOPINIRole (REQUIP) 0.25 MG tablet Take 1 tablet (0.25 mg total) by mouth at bedtime. 30 tablet 0  .  simvastatin (ZOCOR) 40 MG tablet Take 1 tablet (40 mg total) by mouth daily. (Patient taking differently: Take 40 mg by mouth at bedtime. ) 30 tablet 3  . zolpidem (AMBIEN) 10 MG tablet Take 5-10 mg by mouth at bedtime as needed for sleep.       Allergies as of 12/11/2016 - Review Complete 12/11/2016  Allergen Reaction Noted  . Azithromycin Rash and Other (See Comments) 03/08/2015     Review of Systems:    Constitutional: No weight loss, fever or chills Skin: No rash  Cardiovascular: No chest pain Respiratory: No SOB  Gastrointestinal: See HPI and otherwise negative Genitourinary: No dysuria  Neurological: No headache, dizziness or syncope Musculoskeletal: No new muscle or joint pain Hematologic: No bruising Psychiatric: No history of depression or anxiety   Physical Exam:  Vital signs in last 24 hours: Temp:  [97.5 F (36.4 C)] 97.5 F (36.4 C) (10/26 1227) Pulse Rate:  [61-69] 69 (10/26 1700) Resp:  [14-18] 18 (10/26 1630) BP: (122-147)/(53-84) 132/77 (10/26 1700) SpO2:  [87 %-100 %] 87 % (10/26 1700)   General:   Pleasant Caucasian male appears to be in NAD, Well developed, Well nourished, alert and cooperative Head:  Normocephalic and atraumatic. Eyes:   PEERL, EOMI. No icterus. Conjunctiva pink. Ears:  Normal auditory acuity. Neck:  Supple Throat: Oral cavity and pharynx without inflammation, swelling or lesion. Lungs: Respirations even and unlabored. Lungs clear to auscultation bilaterally.   No wheezes, crackles, or rhonchi.  Heart: Normal S1, S2. No MRG. Regular rate and rhythm. No peripheral edema, cyanosis or pallor.  Abdomen:  Soft, nondistended, nontender. No rebound or guarding. Normal bowel sounds. No appreciable masses or hepatomegaly. Rectal:  Rectal not done-Witnessed brb in toilet with clots of maroon blood and brown stool Msk:  Symmetrical without gross deformities.  Extremities:  Without edema, no deformity or joint abnormality.  Neurologic:  Alert and   oriented x4;  grossly normal neurologically.  Skin:   Dry and intact without significant lesions or rashes. Psychiatric: Demonstrates good judgement and reason without abnormal affect or behaviors.   LAB RESULTS:  Recent Labs  12/11/16 1324 12/11/16 1535  WBC 10.7*  --   HGB 8.2* 8.2*  HCT 27.0*  --   PLT 489*  --    CMP pending  PREVIOUS ENDOSCOPIES:            See HPI   Impression / Plan:   Impression: 1.  Hematochezia: 3 episodes today, bright red blood with clots and brown stool; likely diverticular though the patient has not had a recent colonoscopy 2.  Anemia: Last hemoglobin 9.6 a month ago while patient was  hospitalized for previous bleed, now 8.2, previously 12 6 prior to last bleed 3.  Chronic anticoagulation for stroke: With Plavix, last dose this morning  Plan: 1.  Patient has not had a colonoscopy since 2009, now with 2-3 episodes of hematochezia since then, these have been blamed on diverticula in the past and likely this is the case. At this time would recommend repeat colonoscopy. 2.  Did discuss colonoscopy with the patient.  After discussion with Dr. Carlean Purl, this was scheduled for tomorrow.  Did discuss risks, benefits, limitations and alternatives and the patient agrees to proceed. 3.  Patient will be admitted.  His hemoglobin will need to be monitored every 8 hours with transfusion as needed less than 7 4.  Patient's Plavix needs to be held, his last dose was this morning 5.  Please await further recommendations from Dr. Carlean Purl later today  Thank you for your kind consultation, we will continue to follow.  Lavone Nian Lemmon PA-C  12/11/2016,  Pager #: [phone]     [facility] Attending   I have taken an interval history, reviewed the chart and examined the patient. I agree with the Advanced Practitioner's note, impression and recommendations.   We think time to repeat a colonoscopy - probable recurrent diverticular bleeding but could be AVM,  polyp even cancer (doubt).  The risks and benefits as well as alternatives of endoscopic procedure(s) have been discussed and reviewed. All questions answered. The patient agrees to proceed.  Gatha Mayer, MD, [facility] [phone] (pager) 12/11/2016 5:32 PM

## 2016-12-11 NOTE — ED Triage Notes (Signed)
Pt arrives via EMS from home with wife with reports of bloody bowel movement today. Denies abd pain, dizziness, pain. Pt takes plavix.

## 2016-12-11 NOTE — ED Notes (Signed)
Assited patient to the bathroom with walker, one staff assist, patient tolerated well.

## 2016-12-11 NOTE — Progress Notes (Signed)
Received report on pt.

## 2016-12-11 NOTE — ED Provider Notes (Signed)
[facility] Provider Note   CSN: [id number] Arrival date & time: 12/11/16  1227     History   Chief Complaint Chief Complaint  Patient presents with  . Rectal Bleeding    HPI [name] is a 83 y.o. male with a h/o of recurrent GI bleeds, diverticulitis, colonic polyps who presents to the emergency department for chief complaint of 2 episodes of bright red blood per rectum since this morning. He also reports passage of dark, black stool. He denies straining or recent constipation. He denies recent fever, chills, abdominal pain, nausea, vomiting, syncope, dizziness, or lightheadedness.   He was admitted from 9/8-9 for BRBPR, dizziness, and syncope and was told the bleeding was presumed to be from diverticulitis. He was d/ced with follow up to GI, but his appointment is not scheduled until 11/2. He reports he has followed up with Dr. Delfina Redwood, his PCP to have his labs rechecked twice, and he states both sets of labs have been normal.   Last colonoscopy was 10-12 years ago.   PMH includes a CVA in 2017. He is a DNR.   The history is provided by the patient. No language interpreter was used.    Past Medical History:  Diagnosis Date  . Acute CVA (cerebrovascular accident) ([place]) 07/25/2015  . Asthma   . Benign essential HTN   . Cerebral infarction due to embolism of left middle cerebral artery ([place]) 02/03/2014  . Colitis   . Colon polyps   . Diabetes mellitus type 2, diet-controlled ([place]) 12/22/2013  . Diverticulosis   . GERD (gastroesophageal reflux disease)   . Hemorrhoids   . Hiatal hernia   . History of esophageal strciture   . HLD (hyperlipidemia)   . Osteoarthritis   . Proctitis   . Status post dilation of esophageal narrowing   . Stroke [facility])     Patient Active Problem List   Diagnosis Date Noted  . GI bleed 10/25/2016  . BPPV (benign paroxysmal positional vertigo) 10/25/2016  . Lower GI bleed 10/24/2016  . Syncope 10/24/2016  . TIA  (transient ischemic attack) 07/22/2016  . Benign essential HTN   . PAF (paroxysmal atrial fibrillation) ([place])   . Stroke-like symptom 02/22/2016  . Leukocytosis 02/22/2016  . History of lower GI bleeding Dec 2017 02/01/2016  . Chronic anticoagulation 2/2 history of CVA 01/31/2016  . History of CVA (cerebrovascular accident) 01/31/2016  . Sinusitis, chronic 12/12/2015  . Cough variant asthma 10/31/2015  . Insomnia 07/25/2015  . HLD (hyperlipidemia) 02/03/2014  . Ulnar neuropathy at elbow of right upper extremity 02/03/2014  . Cervical radiculopathy 02/03/2014  . Seizures ([place])   . Diabetes mellitus type 2, diet-controlled ([place]) 12/22/2013  . Recurrent ventral hernia 12/15/2011  . History of esophageal strciture     Past Surgical History:  Procedure Laterality Date  . ABDOMINAL HERNIA REPAIR    . COLONOSCOPY     multiple  . ESOPHAGOGASTRODUODENOSCOPY     multiple  . INSERTION OF MESH  01/29/2012   Procedure: INSERTION OF MESH;  Surgeon: Gwenyth Ober, MD;  Location: [place];  Service: General;  Laterality: N/A;  . NISSEN FUNDOPLICATION    . SPLENECTOMY, TOTAL     nontraumatic rupture  . VENTRAL HERNIA REPAIR  01/29/2012    WITH MESH  . VENTRAL HERNIA REPAIR  01/29/2012   Procedure: HERNIA REPAIR VENTRAL ADULT;  Surgeon: Gwenyth Ober, MD;  Location: [facility];  Service: General;  Laterality: N/A;  open recurrent  ventral hernia repair with mesh       Home Medications    Prior to Admission medications   Medication Sig Start Date End Date Taking? Authorizing Provider  acetaminophen (TYLENOL) 500 MG tablet Take 1,000 mg by mouth every 6 (six) hours as needed for headache (pain).   Yes [provider]  albuterol (PROAIR HFA) 108 (90 Base) MCG/ACT inhaler Inhale 2 puffs into the lungs every 6 (six) hours as needed for wheezing or shortness of breath.   Yes [provider]  baclofen (LIORESAL) 10 MG tablet Take 0.5 tablets (5 mg total) by mouth at bedtime as needed  for muscle spasms. Patient taking differently: Take 10 mg by mouth at bedtime as needed for muscle spasms.  10/25/16  Yes Debbe Odea, MD  carvedilol (COREG) 3.125 MG tablet Take 1 tablet (3.125 mg total) by mouth 2 (two) times daily with a meal. 02/26/16  Yes Amin, Ankit Chirag, MD  cetirizine (ZYRTEC) 10 MG tablet Take 10 mg by mouth daily.   Yes [provider]  clopidogrel (PLAVIX) 75 MG tablet Take 1 tablet (75 mg total) by mouth daily. 11/25/16  Yes Garvin Fila, MD  fluticasone (FLONASE) 50 MCG/ACT nasal spray Place 1 spray into both nostrils at bedtime.  06/18/14  Yes [provider]  guaiFENesin (MUCINEX) 600 MG 12 hr tablet Take 600 mg by mouth daily.    Yes [provider]  mometasone-formoterol (DULERA) 100-5 MCG/ACT AERO Inhale 2 puffs into the lungs 2 (two) times daily. 12/12/15  Yes Tanda Rockers, MD  montelukast (SINGULAIR) 10 MG tablet TAKE 1 TABLET (10 MG TOTAL) BY MOUTH AT BEDTIME. 11/04/16  Yes Tanda Rockers, MD  Omega-3 Fatty Acids (FISH OIL) 500 MG CAPS Take 500 mg by mouth daily.   Yes [provider]  rOPINIRole (REQUIP) 0.25 MG tablet Take 1 tablet (0.25 mg total) by mouth at bedtime. 02/26/16  Yes Amin, Jeanella Flattery, MD  simvastatin (ZOCOR) 40 MG tablet Take 1 tablet (40 mg total) by mouth daily. Patient taking differently: Take 40 mg by mouth at bedtime.  02/26/16  Yes Amin, Ankit Chirag, MD  zolpidem (AMBIEN) 10 MG tablet Take 5-10 mg by mouth at bedtime as needed for sleep.    Yes [provider]    Family History Family History  Problem Relation Age of Onset  . Cancer Father     Social History Social History  Substance Use Topics  . Smoking status: Never Smoker  . Smokeless tobacco: Never Used  . Alcohol use 0.6 oz/week    1 Glasses of wine per week     Comment: daily     Allergies   Azithromycin   Review of Systems Review of Systems  Constitutional: Negative for activity change.  Respiratory: Negative  for shortness of breath.   Cardiovascular: Negative for chest pain.  Gastrointestinal: Positive for blood in stool. Negative for abdominal pain, diarrhea, nausea and vomiting.  Musculoskeletal: Negative for back pain.  Skin: Negative for rash.     Physical Exam Updated Vital Signs BP 108/65 (BP Location: Left Arm)   Pulse 72   Temp 98.2 F (36.8 C) (Oral)   Resp 18   Ht '5\' 10"'$  (1.778 m)   Wt 71.1 kg (156 lb 11.2 oz)   SpO2 100%   BMI 22.48 kg/m   Physical Exam  Constitutional: He appears well-developed.  Elderly male.   HENT:  Head: Normocephalic.  Eyes: Conjunctivae are normal.  Neck: Neck supple.  Cardiovascular: Normal rate, regular rhythm and normal heart sounds.  Exam reveals no gallop and no friction rub.   No murmur heard. Pulmonary/Chest: Effort normal and breath sounds normal. No respiratory distress. He has no wheezes. He has no rales. He exhibits no tenderness.  Abdominal: Soft. Bowel sounds are normal. He exhibits no distension and no mass. There is no tenderness. There is no rebound and no guarding. No hernia.  Musculoskeletal: He exhibits no edema, tenderness or deformity.  Neurological: He is alert.  Skin: Skin is warm and dry. Capillary refill takes less than 2 seconds. No erythema. No pallor.  Psychiatric: His behavior is normal.  Nursing note and vitals reviewed.  ED Treatments / Results  Labs (all labs ordered are listed, but only abnormal results are displayed) Labs Reviewed  CBC - Abnormal; Notable for the following:       Result Value   WBC 10.7 (*)    RBC 3.37 (*)    Hemoglobin 8.2 (*)    HCT 27.0 (*)    MCH 24.3 (*)    RDW 15.7 (*)    Platelets 489 (*)    All other components within normal limits  COMPREHENSIVE METABOLIC PANEL - Abnormal; Notable for the following:    Glucose, Bld 108 (*)    Calcium 8.0 (*)    Total Protein 5.4 (*)    Albumin 3.1 (*)    ALT 10 (*)    All other components within normal limits  HEMOGLOBIN - Abnormal;  Notable for the following:    Hemoglobin 8.2 (*)    All other components within normal limits  HEMOGLOBIN  HEMOGLOBIN  TYPE AND SCREEN    EKG  EKG Interpretation None       Radiology No results found.  Procedures Procedures (including critical care time)  Medications Ordered in ED Medications  fluticasone (FLONASE) 50 MCG/ACT nasal spray 1 spray (1 spray Each Nare Given 12/11/16 2201)  mometasone-formoterol (DULERA) 100-5 MCG/ACT inhaler 2 puff (not administered)  carvedilol (COREG) tablet 3.125 mg (3.125 mg Oral Not Given 12/11/16 1856)  rOPINIRole (REQUIP) tablet 0.25 mg (0.25 mg Oral Given 12/11/16 2200)  simvastatin (ZOCOR) tablet 40 mg (40 mg Oral Given 12/11/16 2200)  loratadine (CLARITIN) tablet 10 mg (10 mg Oral Not Given 12/11/16 2036)  guaiFENesin (MUCINEX) 12 hr tablet 600 mg (600 mg Oral Not Given 12/11/16 2035)  zolpidem (AMBIEN) tablet 5 mg (5 mg Oral Given 12/11/16 2200)  montelukast (SINGULAIR) tablet 10 mg (10 mg Oral Given 12/11/16 2200)  omega-3 acid ethyl esters (LOVAZA) capsule 1 g (not administered)  sodium chloride flush (NS) 0.9 % injection 3 mL (3 mLs Intravenous Not Given 12/11/16 2156)  sodium chloride flush (NS) 0.9 % injection 3 mL (not administered)  0.9 %  sodium chloride infusion (not administered)  acetaminophen (TYLENOL) tablet 650 mg (not administered)    Or  acetaminophen (TYLENOL) suppository 650 mg (not administered)  traZODone (DESYREL) tablet 50 mg (not administered)  senna (SENOKOT) tablet 8.6 mg (8.6 mg Oral Not Given 12/11/16 2200)  polyethylene glycol (MIRALAX / GLYCOLAX) packet 17 g (not administered)  ondansetron (ZOFRAN) tablet 4 mg (not administered)    Or  ondansetron (ZOFRAN) injection 4 mg (not administered)  albuterol (PROVENTIL) (2.5 MG/3ML) 0.083% nebulizer solution 2.5 mg (not administered)  0.9 %  sodium chloride infusion ( Intravenous Rate/Dose Change 12/11/16 2033)  pantoprazole (PROTONIX) injection 40 mg (40 mg  Intravenous Given 12/11/16 2201)  hydrALAZINE (APRESOLINE) injection 10 mg (not administered)  sodium  chloride 0.9 % bolus 250 mL (not administered)  peg 3350 powder (MOVIPREP) kit 100 g (100 g Oral Given 12/11/16 1713)    And  peg 3350 powder (MOVIPREP) kit 100 g (100 g Oral Given 12/11/16 2205)     Initial Impression / Assessment and Plan / ED Course  I have reviewed the triage vital signs and the nursing notes.  Pertinent labs & imaging results that were available during my care of the patient were reviewed by me and considered in my medical decision making (see chart for details).     83 year old male with a h/o of recurrent GI bleeds, diverticulitis, and colonic polyps with 2 episodes of BRBPR this AM. He had an additional episode after he was roomed in the ED. Hgb 8.2. He was previously discharged on 9/9 after he was thought to have a diverticular bleed; Hgb was 9.6 at d/c. He has not been seen by Dr. Bonnye Fava since d/c. His appointment is on 11/2. Consulted GI and spoke with Wallace Going who plans to come to the ED and evaluate the patient. Consulted the hospitalist team and spoke with Dr. Denton Brick who will admit the patient for continued work up and treatment of a lower GI bleed. The patient appears reasonably stabilized for admission considering the current resources, flow, and capabilities available in the ED at this time, and I doubt any other [facility] requiring further screening and/or treatment in the ED prior to admission.   Final Clinical Impressions(s) / ED Diagnoses   Final diagnoses:  Acute GI bleeding    New Prescriptions Current Discharge Medication List       [name], [place], PA-C 12/11/16 2228    Davonna Belling, MD 12/12/16 [phone]

## 2016-12-11 NOTE — Progress Notes (Signed)
[name] is a 83 y.o. male patient admitted from ED awake, alert - oriented  X 4 - no acute distress noted.    On admission pt walked to bed, became dizzy and passes out once in the bed. Pt's BP initially was 50/40, head of bed raised and BP went up to 96/52. Pt HR fluctuated between 60 and the mid 30's. MD Emokpae made aware. 250 mL bolus of NS ordered, NS continuous at 15mL/hr, and VS ordered in 15 min and then 30 min after that.   Will cont to eval and treat per MD orders.  Celine Ahr, RN 12/11/2016 6:48 PM

## 2016-12-11 NOTE — H&P (Addendum)
Patient Demographics:    [name], is a 83 y.o. male  MRN: [id number]   DOB - 10-24-1931  Admit Date - 12/11/2016  Outpatient Primary MD for the patient is Seward Carol, MD   Assessment & Plan:    Active Problems:   GI bleed    1)BRBPR/Suspect diverticular bleed-patient presenting with recurrent episode of painless hematochezia, hemoglobin currently stable at 8.2 hemoglobin was 9.6 on 10/25/2016 after rectal bleeding, prior to that hemoglobin was 12.6 on 10/24/2016 prior to rectal bleeding.  Discussed with GI team the plan colonoscopy for 12/12/2016, follow H&H and transfuse as clinically indicated.  Marland KitchenHis last colonoscopy was noted on 08/08/07 by [name] with pandiverticulosis.  2)H/o CVA and TIAs-hold Plavix due to #1 above, continue Simvastatin 40 mg qpm  3)HTN-stable BP at this time , continue Coreg 3.125 mg twice daily  4)COPD-no acute flareup at this time, continue bronchodilators and Singulair  5)Social/Ethics- patient and wife request DNR status, no limitations to treatment however  With History of - Reviewed by me  Past Medical History:  Diagnosis Date  . Acute CVA (cerebrovascular accident) ([place]) 07/25/2015  . Asthma   . Benign essential HTN   . Cerebral infarction due to embolism of left middle cerebral artery ([place]) 02/03/2014  . Colitis   . Colon polyps   . Diabetes mellitus type 2, diet-controlled ([place]) 12/22/2013  . Diverticulosis   . GERD (gastroesophageal reflux disease)   . Hemorrhoids   . Hiatal hernia   . History of esophageal strciture   . HLD (hyperlipidemia)   . Osteoarthritis   . Proctitis   . Status post dilation of esophageal narrowing   . Stroke [facility])       Past Surgical History:  Procedure Laterality Date  . ABDOMINAL HERNIA REPAIR    . esophageal hernia    .  INSERTION OF MESH  01/29/2012   Procedure: INSERTION OF MESH;  Surgeon: Gwenyth Ober, MD;  Location: [place];  Service: General;  Laterality: N/A;  . nissen    . SPLENECTOMY, TOTAL     nontraumatic rupture  . VENTRAL HERNIA REPAIR  01/29/2012    WITH MESH  . VENTRAL HERNIA REPAIR  01/29/2012   Procedure: HERNIA REPAIR VENTRAL ADULT;  Surgeon: Gwenyth Ober, MD;  Location: [facility];  Service: General;  Laterality: N/A;  open recurrent ventral hernia repair with mesh      Chief Complaint  Patient presents with  . Rectal Bleeding      HPI:    [name]  is a 83 y.o. male, with past medical history relevant for pan diverticulosis on last colonoscopy as well as history of recurrent lower GI bleed presumed to be diverticular in nature who presents with another episode of bright red blood per rectum without any abdominal pain Patient was admitted on 10/24/2016 with bright red blood per rectum/painless hematochezia, admission hemoglobin was 12.6 , on 10/25/2016 hemoglobin large 9.6, today  hemoglobin is down to 8.2, patient complains of dizziness, but the dizziness is not new.  No chest pains no palpitations no syncope.  He was supposed to see [name] as outpatient next week for colonoscopy. His last colonoscopy was noted on 08/08/07 by [name] with pandiverticulosis.  In ED..... No nosebleeds, no fevers, no chills, additional history obtained from his wife at bedside, patient and wife request DNR status...Marland KitchenMarland KitchenMarland Kitchen no abdominal pain, no vomiting   Review of systems:    In addition to the HPI above,   A full 12 point Review of 10 Systems was done, except as stated above, all other Review of 10 Systems were negative.    Social History:  Reviewed by me    Social History  Substance Use Topics  . Smoking status: Never Smoker  . Smokeless tobacco: Never Used  . Alcohol use 0.6 oz/week    1 Glasses of wine per week     Comment: daily      Family History :  Reviewed by me    Family History   Problem Relation Age of Onset  . Cancer Father     Home Medications:   Prior to Admission medications   Medication Sig Start Date End Date Taking? Authorizing Provider  acetaminophen (TYLENOL) 500 MG tablet Take 1,000 mg by mouth every 6 (six) hours as needed for headache (pain).   Yes [provider]  albuterol (PROAIR HFA) 108 (90 Base) MCG/ACT inhaler Inhale 2 puffs into the lungs every 6 (six) hours as needed for wheezing or shortness of breath.   Yes [provider]  baclofen (LIORESAL) 10 MG tablet Take 0.5 tablets (5 mg total) by mouth at bedtime as needed for muscle spasms. Patient taking differently: Take 10 mg by mouth at bedtime as needed for muscle spasms.  10/25/16  Yes Debbe Odea, MD  carvedilol (COREG) 3.125 MG tablet Take 1 tablet (3.125 mg total) by mouth 2 (two) times daily with a meal. 02/26/16  Yes Amin, Ankit Chirag, MD  cetirizine (ZYRTEC) 10 MG tablet Take 10 mg by mouth daily.   Yes [provider]  clopidogrel (PLAVIX) 75 MG tablet Take 1 tablet (75 mg total) by mouth daily. 11/25/16  Yes Garvin Fila, MD  fluticasone (FLONASE) 50 MCG/ACT nasal spray Place 1 spray into both nostrils at bedtime.  06/18/14  Yes [provider]  guaiFENesin (MUCINEX) 600 MG 12 hr tablet Take 600 mg by mouth daily.    Yes [provider]  mometasone-formoterol (DULERA) 100-5 MCG/ACT AERO Inhale 2 puffs into the lungs 2 (two) times daily. 12/12/15  Yes Tanda Rockers, MD  montelukast (SINGULAIR) 10 MG tablet TAKE 1 TABLET (10 MG TOTAL) BY MOUTH AT BEDTIME. 11/04/16  Yes Tanda Rockers, MD  Omega-3 Fatty Acids (FISH OIL) 500 MG CAPS Take 500 mg by mouth daily.   Yes [provider]  rOPINIRole (REQUIP) 0.25 MG tablet Take 1 tablet (0.25 mg total) by mouth at bedtime. 02/26/16  Yes Amin, Jeanella Flattery, MD  simvastatin (ZOCOR) 40 MG tablet Take 1 tablet (40 mg total) by mouth daily. Patient taking differently: Take 40 mg by mouth at  bedtime.  02/26/16  Yes Amin, Ankit Chirag, MD  zolpidem (AMBIEN) 10 MG tablet Take 5-10 mg by mouth at bedtime as needed for sleep.    Yes [provider]     Allergies:     Allergies  Allergen Reactions  . Azithromycin Rash and Other (See Comments)  Possible reaction to Z-Pak     Physical Exam:   Vitals  Blood pressure 139/75, pulse 65, temperature (!) 97.5 F (36.4 C), temperature source Oral, resp. rate 16, SpO2 100 %.  Physical Examination: General appearance - alert, well appearing, and in no distress Mental status - alert, oriented to person, place, and time,  Eyes - sclera anicteric Neck - supple, no JVD elevation , Chest - clear  to auscultation bilaterally, symmetrical air movement,  Heart - S1 and S2 normal,  Abdomen - soft, nontender, nondistended, no CVA tenderness Neurological - screening mental status exam normal, neck supple without rigidity, cranial nerves II through XII intact, DTR's normal and symmetric Extremities - no pedal edema noted, intact peripheral pulses  Skin - warm, dry    Data Review:    CBC  Recent Labs Lab 12/11/16 1324 12/11/16 1535  WBC 10.7*  --   HGB 8.2* 8.2*  HCT 27.0*  --   PLT 489*  --   MCV 80.1  --   MCH 24.3*  --   MCHC 30.4  --   RDW 15.7*  --    ------------------------------------------------------------------------------------------------------------------ Chemistries   Recent Labs Lab 12/11/16 1535  NA 137  K 4.0  CL 107  CO2 24  GLUCOSE 108*  BUN 17  CREATININE 0.79  CALCIUM 8.0*  AST 21  ALT 10*  ALKPHOS 55  BILITOT 0.7   ------------------------------------------------------------------------------------------------------------------ CrCl cannot be calculated (Unknown ideal weight.). ------------------------------------------------------------------------------------------------------------------ No results for input(s): TSH, T4TOTAL, T3FREE, THYROIDAB in the last 72 hours.  Invalid  input(s): FREET3   Coagulation profile No results for input(s): INR, PROTIME in the last 168 hours. ------------------------------------------------------------------------------------------------------------------- No results for input(s): DDIMER in the last 72 hours. -------------------------------------------------------------------------------------------------------------------  Cardiac Enzymes No results for input(s): CKMB, TROPONINI, MYOGLOBIN in the last 168 hours.  Invalid input(s): CK ------------------------------------------------------------------------------------------------------------------ No results found for: BNP  ---------------------------------------------------------------------------------------------------------------  Urinalysis    Component Value Date/Time   COLORURINE YELLOW 05/04/2016 [place] 05/04/2016 1123   LABSPEC 1.011 05/04/2016 1123   PHURINE 7.0 05/04/2016 1123   GLUCOSEU NEGATIVE 05/04/2016 1123   HGBUR NEGATIVE 05/04/2016 [place] 05/04/2016 1123   KETONESUR NEGATIVE 05/04/2016 1123   PROTEINUR NEGATIVE 05/04/2016 1123   UROBILINOGEN 0.2 12/22/2013 1210   NITRITE NEGATIVE 05/04/2016 1123   LEUKOCYTESUR NEGATIVE 05/04/2016 1123   ----------------------------------------------------------------------------------------------------------------   Imaging Results:    No results found.  Radiological Exams on Admission: No results found.  DVT Prophylaxis -SCD   AM Labs Ordered, also please review Full Orders  Family Communication: Admission, patients condition and plan of care including tests being ordered have been discussed with the patient and wife who indicate understanding and agree with the plan   Code Status - Full Code  Likely DC to  home  Condition   Stable  Jovian Lembcke M.D on 12/11/2016 at 5:22 PM   Between 7am to 7pm - Pager - [phone] After 7pm go to [email or web address] -  password [email or web address]  [facility] - Office  [phone]  Voice Recognition Viviann Spare dictation system was used to create this note, attempts have been made to correct errors. Please contact the author with questions and/or clarifications.

## 2016-12-12 ENCOUNTER — Encounter (HOSPITAL_COMMUNITY)
Admission: EM | Disposition: A | Discharge status: Home / Self Care | Payer: Self-pay | Source: Home / Self Care | Attending: Internal Medicine

## 2016-12-12 ENCOUNTER — Encounter (HOSPITAL_COMMUNITY): Payer: Self-pay

## 2016-12-12 DIAGNOSIS — K5731 Diverticulosis of large intestine without perforation or abscess with bleeding: Principal | ICD-10-CM

## 2016-12-12 HISTORY — PX: COLONOSCOPY WITH PROPOFOL: SHX5780

## 2016-12-12 LAB — HEMOGLOBIN
Hemoglobin: 7.9 g/dL — ABNORMAL LOW (ref 13.0–17.0)
Hemoglobin: 8.4 g/dL — ABNORMAL LOW (ref 13.0–17.0)

## 2016-12-12 SURGERY — COLONOSCOPY WITH PROPOFOL
Anesthesia: Moderate Sedation

## 2016-12-12 MED ORDER — DIPHENHYDRAMINE HCL 50 MG/ML IJ SOLN
INTRAMUSCULAR | Status: DC | PRN
  Administered 2016-12-12: 25 mg via INTRAVENOUS

## 2016-12-12 MED ORDER — MIDAZOLAM HCL 5 MG/ML IJ SOLN
INTRAMUSCULAR | Status: AC
  Filled 2016-12-12: qty 2

## 2016-12-12 MED ORDER — MIDAZOLAM HCL 5 MG/5ML IJ SOLN
INTRAMUSCULAR | Status: DC | PRN
  Administered 2016-12-12 (×3): 1 mg via INTRAVENOUS

## 2016-12-12 MED ORDER — FENTANYL CITRATE (PF) 100 MCG/2ML IJ SOLN
INTRAMUSCULAR | Status: DC | PRN
  Administered 2016-12-12: 25 ug via INTRAVENOUS
  Administered 2016-12-12: 12.5 ug via INTRAVENOUS

## 2016-12-12 MED ORDER — DIPHENHYDRAMINE HCL 50 MG/ML IJ SOLN
INTRAMUSCULAR | Status: AC
  Filled 2016-12-12: qty 1

## 2016-12-12 MED ORDER — FENTANYL CITRATE (PF) 100 MCG/2ML IJ SOLN
INTRAMUSCULAR | Status: AC
  Filled 2016-12-12: qty 2

## 2016-12-12 SURGICAL SUPPLY — 22 items

## 2016-12-12 NOTE — Progress Notes (Signed)
[facility] Progress Note  Patient: [name] WIO:[id number]   PCP: Seward Carol, MD DOB: 1931-12-20   DOA: 12/11/2016   DOS: 12/12/2016   Date of Service: the patient was seen and examined on 12/12/2016  Subjective: Feeling better, no nausea no vomiting no abdominal pain.  No fever no chills.  Brief hospital course: Pt. with PMH of CVA, HTN; admitted on 12/11/2016, presented with complaint of hematochezia, was found to have diverticular bleed. Currently further plan is monitor H&H.  Assessment and Plan: 1.  Hematochezia. Diverticular bleed. Symptomatic anemia. Baseline hemoglobin is 9.9. Presented with maroon colored blood, hemoglobin went down to 6.7. Received 1 PRBC transfusion. Currently H&H are relatively stable. Underwent colonoscopy which shows no evidence of active bleed and suspected to have diverticular bleed. GI wants to hold off on resuming Plavix and monitor for 48 hours for bleeding.  2.  History of CVA and TIA. Presented with GI bleed.  Plavix on hold. Continue simvastatin.  3.  HTN. Chronic combined diastolic and systolic CHF Blood pressure stable. Continue Coreg.  4.  COPD. Continue inhalers.  Diet: Low fiber diet DVT Prophylaxis: mechanical compression device  Advance goals of care discussion: DNR DNI  Family Communication: family was present at bedside, at the time of interview. The pt provided permission to discuss medical plan with the family. Opportunity was given to ask question and all questions were answered satisfactorily.   Disposition:  Discharge to home.  Consultants: gastroenterology  Procedures: colonoscopy  Antibiotics: Anti-infectives    None       Objective: Physical Exam: Vitals:   12/12/16 0935 12/12/16 0940 12/12/16 0945 12/12/16 0950  BP: (!) 109/46 (!) 114/46 (!) 134/41 (!) 135/47  Pulse: 78 85 79 87  Resp: 18 18 16 20   Temp: 98.1 F (36.7 C)     TempSrc: Oral     SpO2: 100% 100% 100% 98%  Weight:        Height:        Intake/Output Summary (Last 24 hours) at 12/12/16 1431 Last data filed at 12/12/16 0900  Gross per 24 hour  Intake           1455.5 ml  Output              320 ml  Net           1135.5 ml   Filed Weights   12/11/16 2045 12/12/16 0812 12/12/16 0830  Weight: 71.1 kg (156 lb 11.2 oz) 70.8 kg (156 lb) 70.8 kg (156 lb)   General: Alert, Awake and Oriented to Time, Place and Person. Appear in no distress, affect appropriate Eyes: PERRL, Conjunctiva normal ENT: Oral Mucosa clear moist. Neck: no JVD, no Abnormal Mass Or lumps Cardiovascular: S1 and S2 Present, no Murmur, Peripheral Pulses Present Respiratory: normal respiratory effort, Bilateral Air entry equal and Decreased, no use of accessory muscle, Clear to Auscultation, no Crackles, no wheezes Abdomen: Bowel Sound present, Soft and no tenderness, no hernia Skin: no redness, no Rash, no induration Extremities: no Pedal edema, no calf tenderness Neurologic: Grossly no focal neuro deficit. Bilaterally Equal motor strength  Data Reviewed: CBC:  Recent Labs Lab 12/11/16 1324 12/11/16 1535 12/11/16 2315 12/12/16 0712 12/12/16 1326  WBC 10.7*  --   --   --   --   HGB 8.2* 8.2* 6.7* 7.9* 8.4*  HCT 27.0*  --   --   --   --   MCV 80.1  --   --   --   --  PLT 489*  --   --   --   --    Basic Metabolic Panel:  Recent Labs Lab 12/11/16 1535  NA 137  K 4.0  CL 107  CO2 24  GLUCOSE 108*  BUN 17  CREATININE 0.79  CALCIUM 8.0*    Liver Function Tests:  Recent Labs Lab 12/11/16 1535  AST 21  ALT 10*  ALKPHOS 55  BILITOT 0.7  PROT 5.4*  ALBUMIN 3.1*   No results for input(s): LIPASE, AMYLASE in the last 168 hours. No results for input(s): AMMONIA in the last 168 hours. Coagulation Profile: No results for input(s): INR, PROTIME in the last 168 hours. Cardiac Enzymes: No results for input(s): CKTOTAL, CKMB, CKMBINDEX, TROPONINI in the last 168 hours. BNP (last 3 results) No results for input(s):  PROBNP in the last 8760 hours. CBG: No results for input(s): GLUCAP in the last 168 hours. Studies: No results found.  Scheduled Meds: . carvedilol  3.125 mg Oral BID WC  . fluticasone  1 spray Each Nare QHS  . guaiFENesin  600 mg Oral Daily  . loratadine  10 mg Oral Daily  . mometasone-formoterol  2 puff Inhalation BID  . montelukast  10 mg Oral QHS  . omega-3 acid ethyl esters  1 g Oral Daily  . pantoprazole (PROTONIX) IV  40 mg Intravenous Q12H  . rOPINIRole  0.25 mg Oral QHS  . senna  1 tablet Oral BID  . simvastatin  40 mg Oral QHS  . sodium chloride flush  3 mL Intravenous Q12H   Continuous Infusions: . sodium chloride    . sodium chloride 100 mL/hr at 12/12/16 0543   PRN Meds: sodium chloride, acetaminophen **OR** acetaminophen, albuterol, hydrALAZINE, ondansetron **OR** ondansetron (ZOFRAN) IV, polyethylene glycol, sodium chloride flush, traZODone, zolpidem  Time spent: 35 minutes  Author: Berle Mull, MD [facility] Pager: [phone] 12/12/2016 2:31 PM  If 7PM-7AM, please contact night-coverage at [email or web address], password [email or web address]

## 2016-12-12 NOTE — Op Note (Signed)
[facility] Patient Name: [name] Procedure Date : 12/12/2016 MRN: [id number] Attending MD: Gatha Mayer , MD Date of Birth: 31-Oct-1931 CSN: [id number] Age: 83 Admit Type: Inpatient Procedure:                Colonoscopy Indications:              Hematochezia Providers:                Gatha Mayer, MD, Laverta Baltimore RN, RN, Elspeth Cho Tech., Technician Referring MD:              Medicines:                Diphenhydramine 25 mg IV, Midazolam 3 mg IV,                            Fentanyl 37.5 micrograms IV Complications:            No immediate complications. Estimated Blood Loss:     Estimated blood loss: none. Procedure:                Pre-Anesthesia Assessment:                           - Prior to the procedure, a History and Physical                            was performed, and patient medications and                            allergies were reviewed. The patient's tolerance of                            previous anesthesia was also reviewed. The risks                            and benefits of the procedure and the sedation                            options and risks were discussed with the patient.                            All questions were answered, and informed consent                            was obtained. Prior Anticoagulants: The patient                            last took Plavix (clopidogrel) 1 day prior to the                            procedure. ASA Grade Assessment: III - A patient  with severe systemic disease. After reviewing the                            risks and benefits, the patient was deemed in                            satisfactory condition to undergo the procedure.                           After obtaining informed consent, the colonoscope                            was passed under direct vision. Throughout the                            procedure, the patient's blood  pressure, pulse, and                            oxygen saturations were monitored continuously. The                            EC-[id number] ([id number]) scope was introduced through                            the anus and advanced to the the cecum, identified                            by appendiceal orifice and ileocecal valve. The                            colonoscopy was technically difficult and complex                            due to significant looping. Successful completion                            of the procedure was aided by changing the patient                            to a supine position and applying abdominal                            pressure. The patient tolerated the procedure                            fairly well. The quality of the bowel preparation                            was fair and poor. The ileocecal valve, appendiceal                            orifice, and rectum were photographed. Scope In: 3:15:17 AM Scope Out: 9:31:15 AM Scope Withdrawal Time: 0 hours 8 minutes 7 seconds  Total Procedure Duration: 0 hours 34 minutes 34 seconds  Findings:      The perianal and digital rectal examinations were normal.      Many small and large-mouthed diverticula were found in the left colon.       There was no evidence of diverticular bleeding.      The exam was otherwise without abnormality on direct and retroflexion       views. Impression:               - Preparation of the left colon was fair.                           - Preparation of the right colon was poor.                           - Diverticulosis in the left colon. There was no                            evidence of diverticular bleeding. Brown liquid                            stool seen - much but not all flushed and suctioned                            - more so in left colon. Bleeding presumed                            diverticular - stopped.                           - The examination was otherwise  normal on direct                            and retroflexion views.                           - No specimens collected. Moderate Sedation:      Moderate (conscious) sedation was administered by the endoscopy nurse       and supervised by the endoscopist. The following parameters were       monitored: oxygen saturation, heart rate, blood pressure, respiratory       rate, EKG, adequacy of pulmonary ventilation, and response to care.       Total physician intraservice time was 22 minutes. Recommendation:           - Low fiber diet.                           - Continue present medications.                           - Resume Plavix (clopidogrel) at prior dose to be                            determined pending clinical course.                           -  Return patient to hospital ward for ongoing care.                           - Would like to see 48 hrs w/o bleeding prior to dc                           - No repeat colonoscopy due to age. Procedure Code(s):        --- Professional ---                           [phone], Colonoscopy, flexible; diagnostic, including                            collection of specimen(s) by brushing or washing,                            when performed (separate procedure)                           G0500, Moderate sedation services provided by the                            same physician or other qualified health care                            professional performing a gastrointestinal                            endoscopic service that sedation supports,                            requiring the presence of an independent trained                            observer to assist in the monitoring of the                            patient's level of consciousness and physiological                            status; initial 15 minutes of intra-service time;                            patient age 80 years or older (additional time may                            be  reported with [phone], as appropriate) Diagnosis Code(s):        --- Professional ---                           K92.1, Melena (includes Hematochezia)                           K57.30, Diverticulosis of large intestine without  perforation or abscess without bleeding CPT copyright 2016 [employer]. All rights reserved. The codes documented in this report are preliminary and upon coder review may  be revised to meet current compliance requirements. Gatha Mayer, MD 12/12/2016 10:01:31 AM This report has been signed electronically. Number of Addenda: 0

## 2016-12-12 NOTE — Interval H&P Note (Signed)
History and Physical Interval Note:  12/12/2016 8:45 AM  [name]  has presented today for surgery, with the diagnosis of Hematochezia  The various methods of treatment have been discussed with the patient and family. After consideration of risks, benefits and other options for treatment, the patient has consented to  Procedure(s): COLONOSCOPY WITH PROPOFOL (N/A) as a surgical intervention .  The patient's history has been reviewed, patient examined, no change in status, stable for surgery.  I have reviewed the patient's chart and labs.  Questions were answered to the patient's satisfaction.     Silvano Rusk

## 2016-12-12 NOTE — H&P (View-Only) (Signed)
   Consultation  Referring Provider: Dr. Pickering     Primary Care Physician:  Polite, Ronald, MD Primary Gastroenterologist: Dr. Jacobs        Reason for Consultation: Hematochezia             HPI:   [name] is a 83 y.o. male with history of CVA on Plavix as well as diabetes and multiple others listed below, who presented to the ED today with a complaint of hematochezia.    Per review of chart patient was seen in the hospital 10/24/16 through 10/25/16 for the same complaint.  Patient was consulted by Dr. Hung at that time.  His last colonoscopy was noted on 08/08/07 by Dr. Jacobs with pandiverticulosis.  At that admission patient was dizzy which "had not happened with previous diverticular bleeds" per the family.  Patient's bleeding stopped spontaneously.  His hemoglobin drifted from 12.6 down to 9.6 by time of discharge.  It was recommended he follow with our office to consider repeat colonoscopy as outpatient after discharge.   Today, the patient is accompanied by his wife who does help with his history.  They explain that around 930 this morning patient had a bowel movement with a large amount of bright red blood and some clots in it.  This occurred again at 1030.  They then came to the ER.  Patient had one further bloody bowel movement at around 130 this afternoon.  (They have left this in the toilet for me to view, bright red blood with clots and stool).  Patient denies any abdominal pain or symptoms of dizziness or syncope.  He does explain that he has had at least 4 episodes of diverticular bleeding over the past 9 years or so, the last 2-3 within the past 2 years.  His last colonoscopy was in 2009 with Dr. Jacobs.     He does add that chronically, he has some increase in intestinal gas at times which seems to "come in cycles", and sometimes "when I pass gas I also pass some liquid stool".   Patient's medical history is positive for strokes and he is maintained on Plavix.  His last dose was  this morning. Patient denies fever, chills, change in bowel habits prior to bleeding, melena, anorexia, shortness of breath, heartburn, reflux, abdominal or rectal pain.  Labs: Hgb 8.2 today  Past Medical History:  Diagnosis Date  . Acute CVA (cerebrovascular accident) (HCC) 07/25/2015  . Asthma   . Benign essential HTN   . Cerebral infarction due to embolism of left middle cerebral artery (HCC) 02/03/2014  . Colitis   . Colon polyps   . Diabetes mellitus type 2, diet-controlled (HCC) 12/22/2013  . Diverticulosis   . GERD (gastroesophageal reflux disease)   . Hemorrhoids   . Hiatal hernia   . History of esophageal strciture   . HLD (hyperlipidemia)   . Osteoarthritis   . Proctitis   . Status post dilation of esophageal narrowing   . Stroke (HCC)     Past Surgical History:  Procedure Laterality Date  . ABDOMINAL HERNIA REPAIR    . COLONOSCOPY     multiple  . ESOPHAGOGASTRODUODENOSCOPY     multiple  . INSERTION OF MESH  01/29/2012   Procedure: INSERTION OF MESH;  Surgeon: James O Wyatt, MD;  Location: [facility] OR;  Service: General;  Laterality: N/A;  . NISSEN FUNDOPLICATION    . SPLENECTOMY, TOTAL     nontraumatic rupture  . VENTRAL HERNIA REPAIR    01/29/2012    WITH MESH  . VENTRAL HERNIA REPAIR  01/29/2012   Procedure: HERNIA REPAIR VENTRAL ADULT;  Surgeon: James O Wyatt, MD;  Location: [facility] OR;  Service: General;  Laterality: N/A;  open recurrent ventral hernia repair with mesh    Family History  Problem Relation Age of Onset  . Cancer Father      Social History  Substance Use Topics  . Smoking status: Never Smoker  . Smokeless tobacco: Never Used  . Alcohol use 0.6 oz/week    1 Glasses of wine per week     Comment: daily    Prior to Admission medications   Medication Sig Start Date End Date Taking? Authorizing Provider  acetaminophen (TYLENOL) 500 MG tablet Take 1,000 mg by mouth every 6 (six) hours as needed for headache (pain).   Yes [provider]    albuterol (PROAIR HFA) 108 (90 Base) MCG/ACT inhaler Inhale 2 puffs into the lungs every 6 (six) hours as needed for wheezing or shortness of breath.   Yes [provider]  baclofen (LIORESAL) 10 MG tablet Take 0.5 tablets (5 mg total) by mouth at bedtime as needed for muscle spasms. Patient taking differently: Take 10 mg by mouth at bedtime as needed for muscle spasms.  10/25/16  Yes Rizwan, Saima, MD  carvedilol (COREG) 3.125 MG tablet Take 1 tablet (3.125 mg total) by mouth 2 (two) times daily with a meal. 02/26/16  Yes Amin, Ankit Chirag, MD  cetirizine (ZYRTEC) 10 MG tablet Take 10 mg by mouth daily.   Yes [provider]  clopidogrel (PLAVIX) 75 MG tablet Take 1 tablet (75 mg total) by mouth daily. 11/25/16  Yes Sethi, Pramod S, MD  fluticasone (FLONASE) 50 MCG/ACT nasal spray Place 1 spray into both nostrils at bedtime.  06/18/14  Yes [provider]  guaiFENesin (MUCINEX) 600 MG 12 hr tablet Take 600 mg by mouth daily.    Yes [provider]  mometasone-formoterol (DULERA) 100-5 MCG/ACT AERO Inhale 2 puffs into the lungs 2 (two) times daily. 12/12/15  Yes Wert, Michael B, MD  montelukast (SINGULAIR) 10 MG tablet TAKE 1 TABLET (10 MG TOTAL) BY MOUTH AT BEDTIME. 11/04/16  Yes Wert, Michael B, MD  Omega-3 Fatty Acids (FISH OIL) 500 MG CAPS Take 500 mg by mouth daily.   Yes [provider]  rOPINIRole (REQUIP) 0.25 MG tablet Take 1 tablet (0.25 mg total) by mouth at bedtime. 02/26/16  Yes Amin, Ankit Chirag, MD  simvastatin (ZOCOR) 40 MG tablet Take 1 tablet (40 mg total) by mouth daily. Patient taking differently: Take 40 mg by mouth at bedtime.  02/26/16  Yes Amin, Ankit Chirag, MD  zolpidem (AMBIEN) 10 MG tablet Take 5-10 mg by mouth at bedtime as needed for sleep.    Yes [provider]    Current Facility-Administered Medications  Medication Dose Route Frequency Provider Last Rate Last Dose  . 0.9 %  sodium chloride infusion  250 mL  Intravenous PRN Emokpae, Courage, MD      . 0.9 %  sodium chloride infusion   Intravenous Continuous Emokpae, Courage, MD      . acetaminophen (TYLENOL) tablet 650 mg  650 mg Oral Q6H PRN Emokpae, Courage, MD       Or  . acetaminophen (TYLENOL) suppository 650 mg  650 mg Rectal Q6H PRN Emokpae, Courage, MD      . acetaminophen (TYLENOL) tablet 1,000 mg  1,000 mg Oral Q6H PRN Emokpae, Courage, MD      .   albuterol (PROVENTIL HFA;VENTOLIN HFA) 108 (90 Base) MCG/ACT inhaler 2 puff  2 puff Inhalation Q6H PRN Emokpae, Courage, MD      . albuterol (PROVENTIL) (2.5 MG/3ML) 0.083% nebulizer solution 2.5 mg  2.5 mg Nebulization Q2H PRN Emokpae, Courage, MD      . carvedilol (COREG) tablet 3.125 mg  3.125 mg Oral BID WC Emokpae, Courage, MD      . fluticasone (FLONASE) 50 MCG/ACT nasal spray 1 spray  1 spray Each Nare QHS Emokpae, Courage, MD      . guaiFENesin (MUCINEX) 12 hr tablet 600 mg  600 mg Oral Daily Emokpae, Courage, MD      . hydrALAZINE (APRESOLINE) injection 10 mg  10 mg Intravenous Q6H PRN Emokpae, Courage, MD      . loratadine (CLARITIN) tablet 10 mg  10 mg Oral Daily Emokpae, Courage, MD      . mometasone-formoterol (DULERA) 100-5 MCG/ACT inhaler 2 puff  2 puff Inhalation BID Emokpae, Courage, MD      . montelukast (SINGULAIR) tablet 10 mg  10 mg Oral QHS Emokpae, Courage, MD      . omega-3 acid ethyl esters (LOVAZA) capsule 1 g  1 g Oral Daily Emokpae, Courage, MD      . ondansetron (ZOFRAN) tablet 4 mg  4 mg Oral Q6H PRN Emokpae, Courage, MD       Or  . ondansetron (ZOFRAN) injection 4 mg  4 mg Intravenous Q6H PRN Emokpae, Courage, MD      . pantoprazole (PROTONIX) injection 40 mg  40 mg Intravenous Q12H Emokpae, Courage, MD      . peg 3350 powder (MOVIPREP) kit 100 g  0.5 kit Oral Once Zavitz, Joshua, MD      . polyethylene glycol (MIRALAX / GLYCOLAX) packet 17 g  17 g Oral Daily PRN Emokpae, Courage, MD      . rOPINIRole (REQUIP) tablet 0.25 mg  0.25 mg Oral QHS Emokpae, Courage, MD       . senna (SENOKOT) tablet 8.6 mg  1 tablet Oral BID Emokpae, Courage, MD      . simvastatin (ZOCOR) tablet 40 mg  40 mg Oral QHS Emokpae, Courage, MD      . sodium chloride flush (NS) 0.9 % injection 3 mL  3 mL Intravenous Q12H Emokpae, Courage, MD      . sodium chloride flush (NS) 0.9 % injection 3 mL  3 mL Intravenous PRN Emokpae, Courage, MD      . traZODone (DESYREL) tablet 50 mg  50 mg Oral QHS PRN Emokpae, Courage, MD      . zolpidem (AMBIEN) tablet 5 mg  5 mg Oral QHS PRN Emokpae, Courage, MD       Current Outpatient Prescriptions  Medication Sig Dispense Refill  . acetaminophen (TYLENOL) 500 MG tablet Take 1,000 mg by mouth every 6 (six) hours as needed for headache (pain).    . albuterol (PROAIR HFA) 108 (90 Base) MCG/ACT inhaler Inhale 2 puffs into the lungs every 6 (six) hours as needed for wheezing or shortness of breath.    . baclofen (LIORESAL) 10 MG tablet Take 0.5 tablets (5 mg total) by mouth at bedtime as needed for muscle spasms. (Patient taking differently: Take 10 mg by mouth at bedtime as needed for muscle spasms. ) 30 each 0  . carvedilol (COREG) 3.125 MG tablet Take 1 tablet (3.125 mg total) by mouth 2 (two) times daily with a meal. 60 tablet 1  . cetirizine (ZYRTEC) 10 MG tablet Take   10 mg by mouth daily.    . clopidogrel (PLAVIX) 75 MG tablet Take 1 tablet (75 mg total) by mouth daily. 30 tablet 11  . fluticasone (FLONASE) 50 MCG/ACT nasal spray Place 1 spray into both nostrils at bedtime.   5  . guaiFENesin (MUCINEX) 600 MG 12 hr tablet Take 600 mg by mouth daily.     . mometasone-formoterol (DULERA) 100-5 MCG/ACT AERO Inhale 2 puffs into the lungs 2 (two) times daily. 1 Inhaler 0  . montelukast (SINGULAIR) 10 MG tablet TAKE 1 TABLET (10 MG TOTAL) BY MOUTH AT BEDTIME. 30 tablet 10  . Omega-3 Fatty Acids (FISH OIL) 500 MG CAPS Take 500 mg by mouth daily.    . rOPINIRole (REQUIP) 0.25 MG tablet Take 1 tablet (0.25 mg total) by mouth at bedtime. 30 tablet 0  .  simvastatin (ZOCOR) 40 MG tablet Take 1 tablet (40 mg total) by mouth daily. (Patient taking differently: Take 40 mg by mouth at bedtime. ) 30 tablet 3  . zolpidem (AMBIEN) 10 MG tablet Take 5-10 mg by mouth at bedtime as needed for sleep.       Allergies as of 12/11/2016 - Review Complete 12/11/2016  Allergen Reaction Noted  . Azithromycin Rash and Other (See Comments) 03/08/2015     Review of Systems:    Constitutional: No weight loss, fever or chills Skin: No rash  Cardiovascular: No chest pain Respiratory: No SOB  Gastrointestinal: See HPI and otherwise negative Genitourinary: No dysuria  Neurological: No headache, dizziness or syncope Musculoskeletal: No new muscle or joint pain Hematologic: No bruising Psychiatric: No history of depression or anxiety   Physical Exam:  Vital signs in last 24 hours: Temp:  [97.5 F (36.4 C)] 97.5 F (36.4 C) (10/26 1227) Pulse Rate:  [61-69] 69 (10/26 1700) Resp:  [14-18] 18 (10/26 1630) BP: (122-147)/(53-84) 132/77 (10/26 1700) SpO2:  [87 %-100 %] 87 % (10/26 1700)   General:   Pleasant Caucasian male appears to be in NAD, Well developed, Well nourished, alert and cooperative Head:  Normocephalic and atraumatic. Eyes:   PEERL, EOMI. No icterus. Conjunctiva pink. Ears:  Normal auditory acuity. Neck:  Supple Throat: Oral cavity and pharynx without inflammation, swelling or lesion. Lungs: Respirations even and unlabored. Lungs clear to auscultation bilaterally.   No wheezes, crackles, or rhonchi.  Heart: Normal S1, S2. No MRG. Regular rate and rhythm. No peripheral edema, cyanosis or pallor.  Abdomen:  Soft, nondistended, nontender. No rebound or guarding. Normal bowel sounds. No appreciable masses or hepatomegaly. Rectal:  Rectal not done-Witnessed brb in toilet with clots of maroon blood and brown stool Msk:  Symmetrical without gross deformities.  Extremities:  Without edema, no deformity or joint abnormality.  Neurologic:  Alert and   oriented x4;  grossly normal neurologically.  Skin:   Dry and intact without significant lesions or rashes. Psychiatric: Demonstrates good judgement and reason without abnormal affect or behaviors.   LAB RESULTS:  Recent Labs  12/11/16 1324 12/11/16 1535  WBC 10.7*  --   HGB 8.2* 8.2*  HCT 27.0*  --   PLT 489*  --    CMP pending  PREVIOUS ENDOSCOPIES:            See HPI   Impression / Plan:   Impression: 1.  Hematochezia: 3 episodes today, bright red blood with clots and brown stool; likely diverticular though the patient has not had a recent colonoscopy 2.  Anemia: Last hemoglobin 9.6 a month ago while patient was   hospitalized for previous bleed, now 8.2, previously 12 6 prior to last bleed 3.  Chronic anticoagulation for stroke: With Plavix, last dose this morning  Plan: 1.  Patient has not had a colonoscopy since 2009, now with 2-3 episodes of hematochezia since then, these have been blamed on diverticula in the past and likely this is the case. At this time would recommend repeat colonoscopy. 2.  Did discuss colonoscopy with the patient.  After discussion with Dr. Gessner, this was scheduled for tomorrow.  Did discuss risks, benefits, limitations and alternatives and the patient agrees to proceed. 3.  Patient will be admitted.  His hemoglobin will need to be monitored every 8 hours with transfusion as needed less than 7 4.  Patient's Plavix needs to be held, his last dose was this morning 5.  Please await further recommendations from Dr. Gessner later today  Thank you for your kind consultation, we will continue to follow.  Jennifer Lynne Lemmon PA-C  12/11/2016,  Pager #: [phone]     [facility] Attending   I have taken an interval history, reviewed the chart and examined the patient. I agree with the Advanced Practitioner's note, impression and recommendations.   We think time to repeat a colonoscopy - probable recurrent diverticular bleeding but could be AVM,  polyp even cancer (doubt).  The risks and benefits as well as alternatives of endoscopic procedure(s) have been discussed and reviewed. All questions answered. The patient agrees to proceed.  Carl E. Gessner, MD, FACG [facility] [phone] (pager) 12/11/2016 5:32 PM   

## 2016-12-13 LAB — BASIC METABOLIC PANEL
ANION GAP: 7 (ref 5–15)
BUN: 12 mg/dL (ref 6–20)
CALCIUM: 7.8 mg/dL — AB (ref 8.9–10.3)
CO2: 23 mmol/L (ref 22–32)
Chloride: 107 mmol/L (ref 101–111)
Creatinine, Ser: 0.74 mg/dL (ref 0.61–1.24)
GFR calc Af Amer: >60 mL/min (ref >60)
GFR calc non Af Amer: >60 mL/min (ref >60)
GLUCOSE: 109 mg/dL — AB (ref 65–99)
POTASSIUM: 3.4 mmol/L — AB (ref 3.5–5.1)
Sodium: 137 mmol/L (ref 135–145)

## 2016-12-13 LAB — PREPARE RBC (CROSSMATCH)

## 2016-12-13 LAB — TSH: TSH: 3.158 u[IU]/mL (ref 0.350–4.500)

## 2016-12-13 LAB — CBC
HCT: 23.9 % — ABNORMAL LOW (ref 39.0–52.0)
HEMATOCRIT: 28 % — AB (ref 39.0–52.0)
Hemoglobin: 7.2 g/dL — ABNORMAL LOW (ref 13.0–17.0)
Hemoglobin: 8.5 g/dL — ABNORMAL LOW (ref 13.0–17.0)
MCH: 24.2 pg — AB (ref 26.0–34.0)
MCH: 24.6 pg — ABNORMAL LOW (ref 26.0–34.0)
MCHC: 30.1 g/dL (ref 30.0–36.0)
MCHC: 30.4 g/dL (ref 30.0–36.0)
MCV: 80.2 fL (ref 78.0–100.0)
MCV: 81.2 fL (ref 78.0–100.0)
PLATELETS: 388 10*3/uL (ref 150–400)
Platelets: 443 10*3/uL — ABNORMAL HIGH (ref 150–400)
RBC: 2.98 MIL/uL — AB (ref 4.22–5.81)
RBC: 3.45 MIL/uL — AB (ref 4.22–5.81)
RDW: 16 % — ABNORMAL HIGH (ref 11.5–15.5)
RDW: 16.2 % — ABNORMAL HIGH (ref 11.5–15.5)
WBC: 13 10*3/uL — AB (ref 4.0–10.5)
WBC: 11.2 10*3/uL — AB (ref 4.0–10.5)

## 2016-12-13 LAB — MAGNESIUM: Magnesium: 1.9 mg/dL (ref 1.7–2.4)

## 2016-12-13 MED ORDER — PANTOPRAZOLE SODIUM 40 MG PO TBEC: 40.0000 mg | DELAYED_RELEASE_TABLET | Freq: Two times a day (BID) | ORAL | Status: DC

## 2016-12-13 MED ORDER — MOMETASONE FURO-FORMOTEROL FUM 100-5 MCG/ACT IN AERO
2.0000 | INHALATION_SPRAY | Freq: Two times a day (BID) | RESPIRATORY_TRACT | Status: DC
  Administered 2016-12-13: 2 via RESPIRATORY_TRACT
  Filled 2016-12-13: qty 8.8

## 2016-12-13 MED ORDER — SENNA 8.6 MG PO TABS: 1.0000 | ORAL_TABLET | Freq: Two times a day (BID) | ORAL | 0 refills | Status: DC

## 2016-12-13 MED ORDER — POLYETHYLENE GLYCOL 3350 17 G PO PACK: 17.0000 g | PACK | Freq: Every day | ORAL | 0 refills | Status: DC | PRN

## 2016-12-13 MED ORDER — SODIUM CHLORIDE 0.9 % IV SOLN
Freq: Once | INTRAVENOUS | Status: AC
  Administered 2016-12-13: 13:00:00 via INTRAVENOUS

## 2016-12-13 NOTE — Progress Notes (Signed)
Pt given discharge instructions, prescriptions, and care notes. Pt verbalized understanding AEB no further questions or concerns at this time. IV was discontinued, no redness, pain, or swelling noted at this time. Telemetry discontinued and Centralized Telemetry was notified. Pt left the floor via wheelchair with staff in stable condition. 

## 2016-12-13 NOTE — Progress Notes (Signed)
NT notified RN that HR was in 40's per monitor, RN listened to apical HR and also checked radial HR and got 48 beats/ minute. RN notified CCMD who states they see rate as 83/min per monitor.  Patient is asymptomatic at this time.  RN notified Dr. Maudie Mercury who states to watch patient and make MD aware if patient becomes symptomatic.  Dr. Maudie Mercury states he will order thyroid panel as well.  RN instructed patient to notify RN if he starts feeling dizzy or different from how he does now, patient voiced understanding.  P.J. Linus Mako, RN

## 2016-12-13 NOTE — Discharge Instructions (Signed)
Low-Fiber Diet °Fiber is found in fruits, vegetables, and whole grains. A low-fiber diet restricts fibrous foods that are not digested in the small intestine. A diet containing about 10-15 grams of fiber per day is considered low fiber. Low-fiber diets may be used to: °· Promote healing and rest the bowel during intestinal flare-ups. °· Prevent blockage of a partially obstructed or narrowed gastrointestinal tract. °· Reduce fecal weight and volume. °· Slow the movement of feces. ° °You may be on a low-fiber diet as a transitional diet following surgery, after an injury (trauma), or because of a short (acute) or lifelong (chronic) illness. Your health care provider will determine the length of time you need to stay on this diet. °What do I need to know about a low-fiber diet? °Always check the fiber content on the packaging's Nutrition Facts label, especially on foods from the grains list. Ask your dietitian if you have questions about specific foods that are related to your condition, especially if the food is not listed below. In general, a low-fiber food will have less than 2 g of fiber. °What foods can I eat? °Grains °All breads and crackers made with white flour. Sweet rolls, doughnuts, waffles, pancakes, French toast, bagels. Pretzels, Melba toast, zwieback. Well-cooked cereals, such as cornmeal, farina, or cream cereals. Dry cereals that do not contain whole grains, fruit, or nuts, such as refined corn, wheat, rice, and oat cereals. Potatoes prepared any way without skins, plain pastas and noodles, refined white rice. Use white flour for baking and making sauces. Use allowed list of grains for casseroles, dumplings, and puddings. °Vegetables °Strained tomato and vegetable juices. Fresh lettuce, cucumber, spinach. Well-cooked (no skin or pulp) or canned vegetables, such as asparagus, bean sprouts, beets, carrots, green beans, mushrooms, potatoes, pumpkin, spinach, yellow squash, tomato sauce/puree, turnips,  yams, and zucchini. Keep servings limited to ½ cup. °Fruits °All fruit juices except prune juice. Cooked or canned fruits without skin and seeds, such as applesauce, apricots, cherries, fruit cocktail, grapefruit, grapes, mandarin oranges, melons, peaches, pears, pineapple, and plums. Fresh fruits without skin, such as apricots, avocados, bananas, melons, pineapple, nectarines, and peaches. Keep servings limited to ½ cup or 1 piece. °Meat and Other Protein Sources °Ground or well-cooked tender beef, ham, veal, lamb, pork, or poultry. Eggs, plain cheese. Fish, oysters, shrimp, lobster, and other seafood. Liver, organ meats. Smooth nut butters. °Dairy °All milk products and alternative dairy substitutes, such as soy, rice, almond, and coconut, not containing added whole nuts, seeds, or added fruit. °Beverages °Decaf coffee, fruit, and vegetable juices or smoothies (small amounts, with no pulp or skins, and with fruits from allowed list), sports drinks, herbal tea. °Condiments °Ketchup, mustard, vinegar, cream sauce, cheese sauce, cocoa powder. Spices in moderation, such as allspice, basil, bay leaves, celery powder or leaves, cinnamon, cumin powder, curry powder, ginger, mace, marjoram, onion or garlic powder, oregano, paprika, parsley flakes, ground pepper, rosemary, sage, savory, tarragon, thyme, and turmeric. °Sweets and Desserts °Plain cakes and cookies, pie made with allowed fruit, pudding, custard, cream pie. Gelatin, fruit, ice, sherbet, frozen ice pops. Ice cream, ice milk without nuts. Plain hard candy, honey, jelly, molasses, syrup, sugar, chocolate syrup, gumdrops, marshmallows. Limit overall sugar intake. °Fats and Oil °Margarine, butter, cream, mayonnaise, salad oils, plain salad dressings made from allowed foods. Choose healthy fats such as olive oil, canola oil, and omega-3 fatty acids (such as found in salmon or tuna) when possible. °Other °Bouillon, broth, or cream soups made from allowed foods. Any    strained soup. Casseroles or mixed dishes made with allowed foods. °The items listed above may not be a complete list of recommended foods or beverages. Contact your dietitian for more options. °What foods are not recommended? °Grains °All whole wheat and whole grain breads and crackers. Multigrains, rye, bran seeds, nuts, or coconut. Cereals containing whole grains, multigrains, bran, coconut, nuts, raisins. Cooked or dry oatmeal, steel-cut oats. Coarse wheat cereals, granola. Cereals advertised as high fiber. Potato skins. Whole grain pasta, wild or brown rice. Popcorn. Coconut flour. Bran, buckwheat, corn bread, multigrains, rye, wheat germ. °Vegetables °Fresh, cooked or canned vegetables, such as artichokes, asparagus, beet greens, broccoli, Brussels sprouts, cabbage, celery, cauliflower, corn, eggplant, kale, legumes or beans, okra, peas, and tomatoes. Avoid large servings of any vegetables, especially raw vegetables. °Fruits °Fresh fruits, such as apples with or without skin, berries, cherries, figs, grapes, grapefruit, guavas, kiwis, mangoes, oranges, papayas, pears, persimmons, pineapple, and pomegranate. Prune juice and juices with pulp, stewed or dried prunes. Dried fruits, dates, raisins. Fruit seeds or skins. Avoid large servings of all fresh fruits. °Meats and Other Protein Sources °Tough, fibrous meats with gristle. Chunky nut butter. Cheese made with seeds, nuts, or other foods not recommended. Nuts, seeds, legumes (beans, including baked beans), dried peas, beans, lentils. °Dairy °Yogurt or cheese that contains nuts, seeds, or added fruit. °Beverages °Fruit juices with high pulp, prune juice. Caffeinated coffee and teas. °Condiments °Coconut, maple syrup, pickles, olives. °Sweets and Desserts °Desserts, cookies, or candies that contain nuts or coconut, chunky peanut butter, dried fruits. Jams, preserves with seeds, marmalade. Large amounts of sugar and sweets. Any other dessert made with fruits from  the not recommended list. °Other °Soups made from vegetables that are not recommended or that contain other foods not recommended. °The items listed above may not be a complete list of foods and beverages to avoid. Contact your dietitian for more information. °This information is not intended to replace advice given to you by your health care provider. Make sure you discuss any questions you have with your health care provider. °Document Released: 07/25/2001 Document Revised: 07/11/2015 Document Reviewed: 12/26/2012 °Elsevier Interactive Patient Education © 2017 [employer] ° °

## 2016-12-13 NOTE — Care Management Note (Signed)
Case Management Note  Patient Details  Name: [name] MRN: [id number] Date of Birth: 1931-08-10  Subjective/Objective:      83 y.o. M to be discharged back to his ALF at [facility] today with [place]. Wife tells me if he has a written Rx she can handle the arrangements at [facility] when they are discharged. CM notified MD. No DME required              Action/Plan:CM will sign off for now but will be available should additional discharge needs arise or disposition change.    Expected Discharge Date:  12/13/16               Expected Discharge Plan:     In-House Referral:     Discharge planning Services  CM Consult  Post Acute Care Choice:  Home Health Choice offered to:  Spouse  DME Arranged:    DME Agency:     HH Arranged:  PT HH Agency:     Status of Service:  Completed, signed off  If discussed at [place] of Stay Meetings, dates discussed:    Additional Comments:  Delrae Sawyers, RN 12/13/2016, 4:42 PM

## 2016-12-13 NOTE — Progress Notes (Signed)
   Patient Name: [name] Date of Encounter: 12/13/2016, 10:17 AM    Assessment and Plan  Diverticulosis w/ hemorrhage Acute on chronic anemia  I suggest at least 1 maybe 2 U RBC Home ok Plavix restart tomorrow He sees Dr. Ardis Hughs in our ofc Fri - limited management options Trying not to hold Plavix too much given freq strokes/TIA's and bleeding risk less serious than stroke effects  D/W Dr. Verdell Face, MD, [facility] [phone] (pager) 12/13/2016 10:17 AM   Subjective  No more bleeding Weak Transient bradycardia  EKG ok   Objective  BP (!) 132/49 (BP Location: Left Arm)   Pulse (!) 48   Temp 98.2 F (36.8 C) (Oral)   Resp 20   Ht 5\' 10"  (1.778 m)   Wt 156 lb (70.8 kg)   SpO2 97%   BMI 22.38 kg/m  Pale NAD  CBC Latest Ref Rng & Units 12/13/2016 12/12/2016 12/12/2016  WBC 4.0 - 10.5 K/uL 13.0(H) - -  Hemoglobin 13.0 - 17.0 g/dL 7.2(L) 8.4(L) 7.9(L)  Hematocrit 39.0 - 52.0 % 23.9(L) - -  Platelets 150 - 400 K/uL 388 - -

## 2016-12-13 NOTE — Progress Notes (Signed)
PT Cancellation Note  Patient Details Name: [name] MRN: [id number] DOB: Dec 31, 1931   Cancelled Treatment:    Reason Eval/Treat Not Completed: Medical issues which prohibited therapy;Patient declined, no reason specified.   Patient receiving blood.  Patient to hold RUE still in extension per RN to maintain IV.  Patient to have blood running for several hours.  Will return tomorrow for PT evaluation.   Despina Pole 12/13/2016, 4:32 PM Carita Pian. Sanjuana Kava, [place] Pager [phone]

## 2016-12-14 LAB — BPAM RBC
Blood Product Expiration Date: 201811212359
Blood Product Expiration Date: 201811212359
ISSUE DATE / TIME: 201810270027
ISSUE DATE / TIME: 201810281241
UNIT TYPE AND RH: 5100
Unit Type and Rh: 5100

## 2016-12-14 LAB — TYPE AND SCREEN
ABO/RH(D): O POS
ANTIBODY SCREEN: NEGATIVE
UNIT DIVISION: 0
Unit division: 0

## 2016-12-14 NOTE — Discharge Summary (Signed)
[facility] Discharge Summary   Patient: [name] FUX:[id number]   PCP: Seward Carol, MD DOB: 03-26-1931   Date of admission: 12/11/2016   Date of discharge: 12/13/2016  Discharge Diagnoses:  Active Problems:   GI bleed   Admitted From: home Disposition:  ALF with home health  Recommendations for Outpatient Follow-up:  1. Follow-up with PCP in 1 week with a CBC.  Follow-up Information    Seward Carol, MD. Schedule an appointment as soon as possible for a visit in 1 week(s).   Specialty:  Internal Medicine Contact information: [address] [place] [address] [phone]          Diet recommendation: Cardiac diet  Activity: The patient is advised to gradually reintroduce usual activities.  Discharge Condition: good  Code Status: DNR/DNI  History of present illness: As per the H and P dictated on admission, "[name]  is a 83 y.o. male, with past medical history relevant for pan diverticulosis on last colonoscopy as well as history of recurrent lower GI bleed presumed to be diverticular in nature who presents with another episode of bright red blood per rectum without any abdominal pain Patient was admitted on 10/24/2016 with bright red blood per rectum/painless hematochezia, admission hemoglobin was 12.6 , on 10/25/2016 hemoglobin large 9.6, today hemoglobin is down to 8.2, patient complains of dizziness, but the dizziness is not new.  No chest pains no palpitations no syncope.  He was supposed to see Dr. Ardis Hughs as outpatient next week for colonoscopy. Hislast colonoscopy was noted on 08/08/07 by Dr. Ardis Hughs with pandiverticulosis.  In ED..... No nosebleeds, no fevers, no chills, additional history obtained from his wife at bedside, patient and wife request DNR status...Marland KitchenMarland KitchenMarland Kitchen no abdominal pain, no vomiting"  Hospital Course:  Summary of his active problems in the hospital is as following. 1.  Hematochezia. Diverticular bleed. Symptomatic  anemia. Baseline hemoglobin is 9.9. Presented with maroon colored blood, hemoglobin went down to 6.7. Received 1 PRBC transfusion. Currently H&H are relatively stable. Underwent colonoscopy which shows no evidence of active bleed and suspected to have diverticular bleed. GI recommended that the patient can resume Plavix starting tomorrow on discharge. GI also requested to transfuse the patient 1 unit PRBC before discharge.  2.  History of CVA and TIA. Presented with GI bleed.  Plavix on hold.  Resume on discharge Continue simvastatin.  3.  HTN. Chronic combined diastolic and systolic CHF Blood pressure stable. Continue Coreg. There was some concern regarding bradycardia although EKG as well as telemetry evaluation shows no evidence of significant bradycardia. 4.  COPD. Continue inhalers.  All other chronic medical condition were stable during the hospitalization.  Patient was seen by physical therapy, who recommended home health, which was arranged by Education officer, museum and case Freight forwarder. On the day of the discharge the patient's vitals were stable, and no other acute medical condition were reported by patient. the patient was felt safe to be discharge at home with home health.  Procedures and Results:  colonoscopy   Consultations:  [facility]  DISCHARGE MEDICATION: Discharge Medication List as of 12/13/2016  5:36 PM    START taking these medications   Details  polyethylene glycol (MIRALAX / GLYCOLAX) packet Take 17 g by mouth daily as needed for mild constipation., Starting Sun 12/13/2016, Normal    senna (SENOKOT) 8.6 MG TABS tablet Take 1 tablet (8.6 mg total) by mouth 2 (two) times daily., Starting Sun 12/13/2016, Normal      CONTINUE  these medications which have NOT CHANGED   Details  acetaminophen (TYLENOL) 500 MG tablet Take 1,000 mg by mouth every 6 (six) hours as needed for headache (pain)., Historical Med    albuterol (PROAIR HFA) 108 (90 Base)  MCG/ACT inhaler Inhale 2 puffs into the lungs every 6 (six) hours as needed for wheezing or shortness of breath., Historical Med    baclofen (LIORESAL) 10 MG tablet Take 0.5 tablets (5 mg total) by mouth at bedtime as needed for muscle spasms., Starting Sun 10/25/2016, Normal    carvedilol (COREG) 3.125 MG tablet Take 1 tablet (3.125 mg total) by mouth 2 (two) times daily with a meal., Starting Wed 02/26/2016, Normal    cetirizine (ZYRTEC) 10 MG tablet Take 10 mg by mouth daily., Historical Med    clopidogrel (PLAVIX) 75 MG tablet Take 1 tablet (75 mg total) by mouth daily., Starting Wed 11/25/2016, Normal    fluticasone (FLONASE) 50 MCG/ACT nasal spray Place 1 spray into both nostrils at bedtime. , Starting Mon 06/18/2014, Historical Med    guaiFENesin (MUCINEX) 600 MG 12 hr tablet Take 600 mg by mouth daily. , Historical Med    mometasone-formoterol (DULERA) 100-5 MCG/ACT AERO Inhale 2 puffs into the lungs 2 (two) times daily., Starting Thu 12/12/2015, Sample    montelukast (SINGULAIR) 10 MG tablet TAKE 1 TABLET (10 MG TOTAL) BY MOUTH AT BEDTIME., Starting Wed 11/04/2016, Normal    Omega-3 Fatty Acids (FISH OIL) 500 MG CAPS Take 500 mg by mouth daily., Historical Med    rOPINIRole (REQUIP) 0.25 MG tablet Take 1 tablet (0.25 mg total) by mouth at bedtime., Starting Wed 02/26/2016, Normal    simvastatin (ZOCOR) 40 MG tablet Take 1 tablet (40 mg total) by mouth daily., Starting Wed 02/26/2016, Normal    zolpidem (AMBIEN) 10 MG tablet Take 5-10 mg by mouth at bedtime as needed for sleep. , Historical Med       Allergies  Allergen Reactions  . Azithromycin Rash and Other (See Comments)    Possible reaction to Z-Pak   Discharge Instructions    Diet - low sodium heart healthy    Complete by:  As directed    Discharge instructions    Complete by:  As directed    It is important that you read following instructions as well as go over your medication list with RN to help you understand your  care after this hospitalization.  Discharge Instructions: Please follow-up with PCP in one week  Please request your primary care physician to go over all Hospital Tests and Procedure/Radiological results at the follow up,  Please get all Hospital records sent to your PCP by signing hospital release before you go home.   Do not take more than prescribed Pain, Sleep and Anxiety Medications. You were cared for by a hospitalist during your hospital stay. If you have any questions about your discharge medications or the care you received while you were in the hospital after you are discharged, you can call the unit and ask to speak with the hospitalist on call if the hospitalist that took care of you is not available.  Once you are discharged, your primary care physician will handle any further medical issues. Please note that NO REFILLS for any discharge medications will be authorized once you are discharged, as it is imperative that you return to your primary care physician (or establish a relationship with a primary care physician if you do not have one) for your aftercare needs so that they  can reassess your need for medications and monitor your lab values. You Must read complete instructions/literature along with all the possible adverse reactions/side effects for all the Medicines you take and that have been prescribed to you. Take any new Medicines after you have completely understood and accept all the possible adverse reactions/side effects. Wear Seat belts while driving. If you have smoked or chewed Tobacco in the last 2 yrs please stop smoking and/or stop any Recreational drug use.   Increase activity slowly    Complete by:  As directed      Discharge Exam: Filed Weights   12/11/16 2045 12/12/16 0812 12/12/16 0830  Weight: 71.1 kg (156 lb 11.2 oz) 70.8 kg (156 lb) 70.8 kg (156 lb)   Vitals:   12/13/16 1313 12/13/16 1630  BP: 139/78 (!) 157/74  Pulse: 75 76  Resp:    Temp: 98.2 F  (36.8 C) 98.4 F (36.9 C)  SpO2: 98% 98%   General: Appear in no distress, no Rash; Oral Mucosa moist. Cardiovascular: S1 and S2 Present, no Murmur, no JVD Respiratory: Bilateral Air entry present and Clear to Auscultation, no Crackles, no wheezes Abdomen: Bowel Sound present, Soft and no tenderness Extremities: no Pedal edema, no calf tenderness Neurology: Grossly no focal neuro deficit.  The results of significant diagnostics from this hospitalization (including imaging, microbiology, ancillary and laboratory) are listed below for reference.    Significant Diagnostic Studies: No results found.  Microbiology: No results found for this or any previous visit (from the past 240 hour(s)).   Labs: CBC:  Recent Labs Lab 12/11/16 1324  12/11/16 2315 12/12/16 0712 12/12/16 1326 12/13/16 0333 12/13/16 1138  WBC 10.7*  --   --   --   --  13.0* 11.2*  HGB 8.2*  < > 6.7* 7.9* 8.4* 7.2* 8.5*  HCT 27.0*  --   --   --   --  23.9* 28.0*  MCV 80.1  --   --   --   --  80.2 81.2  PLT 489*  --   --   --   --  388 443*  < > = values in this interval not displayed. Basic Metabolic Panel:  Recent Labs Lab 12/11/16 1535 12/13/16 0333  NA 137 137  K 4.0 3.4*  CL 107 107  CO2 24 23  GLUCOSE 108* 109*  BUN 17 12  CREATININE 0.79 0.74  CALCIUM 8.0* 7.8*  MG  --  1.9   Liver Function Tests:  Recent Labs Lab 12/11/16 1535  AST 21  ALT 10*  ALKPHOS 55  BILITOT 0.7  PROT 5.4*  ALBUMIN 3.1*   Time spent: 35 minutes  Signed:  Kristilyn Coltrane  [facility] 12/13/2016 , 2:20 PM

## 2016-12-16 DIAGNOSIS — H35372 Puckering of macula, left eye: Secondary | ICD-10-CM | POA: Diagnosis not present

## 2016-12-16 DIAGNOSIS — H01025 Squamous blepharitis left lower eyelid: Secondary | ICD-10-CM | POA: Diagnosis not present

## 2016-12-16 DIAGNOSIS — H01022 Squamous blepharitis right lower eyelid: Secondary | ICD-10-CM | POA: Diagnosis not present

## 2016-12-16 DIAGNOSIS — H0015 Chalazion left lower eyelid: Secondary | ICD-10-CM | POA: Diagnosis not present

## 2016-12-16 DIAGNOSIS — H01024 Squamous blepharitis left upper eyelid: Secondary | ICD-10-CM | POA: Diagnosis not present

## 2016-12-16 DIAGNOSIS — I63411 Cerebral infarction due to embolism of right middle cerebral artery: Secondary | ICD-10-CM | POA: Diagnosis not present

## 2016-12-16 DIAGNOSIS — H43813 Vitreous degeneration, bilateral: Secondary | ICD-10-CM | POA: Diagnosis not present

## 2016-12-16 DIAGNOSIS — E119 Type 2 diabetes mellitus without complications: Secondary | ICD-10-CM | POA: Diagnosis not present

## 2016-12-16 DIAGNOSIS — Z961 Presence of intraocular lens: Secondary | ICD-10-CM | POA: Diagnosis not present

## 2016-12-16 DIAGNOSIS — H01021 Squamous blepharitis right upper eyelid: Secondary | ICD-10-CM | POA: Diagnosis not present

## 2016-12-17 DIAGNOSIS — D649 Anemia, unspecified: Secondary | ICD-10-CM | POA: Diagnosis not present

## 2016-12-17 DIAGNOSIS — G2581 Restless legs syndrome: Secondary | ICD-10-CM | POA: Diagnosis not present

## 2016-12-17 DIAGNOSIS — K922 Gastrointestinal hemorrhage, unspecified: Secondary | ICD-10-CM | POA: Diagnosis not present

## 2016-12-18 ENCOUNTER — Ambulatory Visit (INDEPENDENT_AMBULATORY_CARE_PROVIDER_SITE_OTHER): Payer: Medicare HMO | Admitting: Gastroenterology

## 2016-12-18 ENCOUNTER — Encounter: Payer: Self-pay | Admitting: Gastroenterology

## 2016-12-18 VITALS — BP 136/74 | HR 68 | Ht 69.0 in | Wt 159.1 lb

## 2016-12-18 DIAGNOSIS — K625 Hemorrhage of anus and rectum: Secondary | ICD-10-CM | POA: Diagnosis not present

## 2016-12-18 NOTE — Progress Notes (Signed)
Review of pertinent gastrointestinal problems:  1. routine risk for colon cancer. Colonoscopy 2003 with Dr. Inocente Salles, polyps removed but none were adenomatous. Colonoscopy June 2009 Dr. Ardis Hughs found no polyps. Significant left sided diverticulosis was noted.  2. esophageal ring, focal stricture dilated 2009 with deeper than usual mucosal tear, followup Gastrografin esophagram showed no extravasation  3. status post fundoplication  4. Diverticular hemorrhage in 2009, did not require blood transfusion; 2018 he was hospitalized twice for overt red rectal bleeding, presumed diverticular.  Hb nadir 6.8 (normally Hb 14). Colonoscopy 11/2016 Dr. Carlean Purl confirmed multiple left-sided diverticulosis pockets, the prep in the right side was poor.  There was no acute blood in the colon.  He also presumed that his acute  bleeding was diverticular in origin.  Chronic Plavix use probably contributes 5. Splenectomy; 2009, ruptured.   HPI: This is a very pleasant 83 year old man whom I last saw in the distant past however he recently was seen in the hospital by one my partners for a recurrent lower GI bleed that was presumed diverticular in origin  Chief complaint is diverticular bleeding  He left the hospital 4 days ago and since then his stools have been back to normal.  He has seen no further overt bleeding.  He is feeling a bit tired but overall getting back to his usual.  He resumed his Plavix as he was directed to.  He has been on a low fiber diet which is not easy for him or his wife  Hb was 9.1 yesterday at PCP office.  ROS: complete GI ROS as described in HPI, all other review negative.  Constitutional:  No unintentional weight loss   Past Medical History:  Diagnosis Date  . Acute CVA (cerebrovascular accident) ([place]) 07/25/2015  . Asthma   . Benign essential HTN   . Cerebral infarction due to embolism of left middle cerebral artery ([place]) 02/03/2014  . Colitis   . Colon polyps   . Diabetes mellitus  type 2, diet-controlled ([place]) 12/22/2013  . Diverticulosis   . GERD (gastroesophageal reflux disease)   . GI bleed   . Hemorrhoids   . Hiatal hernia   . History of esophageal strciture   . HLD (hyperlipidemia)   . Osteoarthritis   . Proctitis   . Status post dilation of esophageal narrowing   . Stroke [facility])     Past Surgical History:  Procedure Laterality Date  . ABDOMINAL HERNIA REPAIR    . COLONOSCOPY     multiple  . COLONOSCOPY WITH PROPOFOL N/A 12/12/2016   Procedure: COLONOSCOPY WITH PROPOFOL;  Surgeon: Gatha Mayer, MD;  Location[facility] ENDOSCOPY;  Service: Endoscopy;  Laterality: N/A;  . ESOPHAGOGASTRODUODENOSCOPY     multiple  . INSERTION OF MESH  01/29/2012   Procedure: INSERTION OF MESH;  Surgeon: Gwenyth Ober, MD;  Location: [facility];  Service: General;  Laterality: N/A;  . NISSEN FUNDOPLICATION    . SPLENECTOMY, TOTAL     nontraumatic rupture  . VENTRAL HERNIA REPAIR  01/29/2012    WITH MESH  . VENTRAL HERNIA REPAIR  01/29/2012   Procedure: HERNIA REPAIR VENTRAL ADULT;  Surgeon: Gwenyth Ober, MD;  Location: [facility];  Service: General;  Laterality: N/A;  open recurrent ventral hernia repair with mesh    Current Outpatient Prescriptions  Medication Sig Dispense Refill  . acetaminophen (TYLENOL) 500 MG tablet Take 1,000 mg by mouth every 6 (six) hours as needed for headache (pain).    Marland Kitchen albuterol (PROAIR HFA) 108 (  90 Base) MCG/ACT inhaler Inhale 2 puffs into the lungs every 6 (six) hours as needed for wheezing or shortness of breath.    . baclofen (LIORESAL) 10 MG tablet Take 0.5 tablets (5 mg total) by mouth at bedtime as needed for muscle spasms. (Patient taking differently: Take 10 mg by mouth at bedtime as needed for muscle spasms. ) 30 each 0  . carvedilol (COREG) 3.125 MG tablet Take 1 tablet (3.125 mg total) by mouth 2 (two) times daily with a meal. 60 tablet 1  . cetirizine (ZYRTEC) 10 MG tablet Take 10 mg by mouth daily.    . clopidogrel (PLAVIX) 75 MG tablet  Take 1 tablet (75 mg total) by mouth daily. 30 tablet 11  . fluticasone (FLONASE) 50 MCG/ACT nasal spray Place 1 spray into both nostrils at bedtime.   5  . guaiFENesin (MUCINEX) 600 MG 12 hr tablet Take 600 mg by mouth daily.     . mometasone-formoterol (DULERA) 100-5 MCG/ACT AERO Inhale 2 puffs into the lungs 2 (two) times daily. 1 Inhaler 0  . montelukast (SINGULAIR) 10 MG tablet TAKE 1 TABLET (10 MG TOTAL) BY MOUTH AT BEDTIME. 30 tablet 10  . Omega-3 Fatty Acids (FISH OIL) 500 MG CAPS Take 500 mg by mouth daily.    . polyethylene glycol (MIRALAX / GLYCOLAX) packet Take 17 g by mouth daily as needed for mild constipation. 14 each 0  . rOPINIRole (REQUIP) 0.25 MG tablet Take 1 tablet (0.25 mg total) by mouth at bedtime. (Patient taking differently: Take 0.5 mg by mouth at bedtime. ) 30 tablet 0  . senna (SENOKOT) 8.6 MG TABS tablet Take 1 tablet (8.6 mg total) by mouth 2 (two) times daily. 120 each 0  . simvastatin (ZOCOR) 40 MG tablet Take 1 tablet (40 mg total) by mouth daily. (Patient taking differently: Take 40 mg by mouth at bedtime. ) 30 tablet 3  . zolpidem (AMBIEN) 10 MG tablet Take 5-10 mg by mouth at bedtime as needed for sleep.      No current facility-administered medications for this visit.     Allergies as of 12/18/2016 - Review Complete 12/18/2016  Allergen Reaction Noted  . Azithromycin Rash and Other (See Comments) 03/08/2015    Family History  Problem Relation Age of Onset  . Cancer Father     Social History   Social History  . Marital status: Married    Spouse name: [name]  . Number of children: 2  . Years of education: Bachelor's   Occupational History  . retired    Social History Main Topics  . Smoking status: Never Smoker  . Smokeless tobacco: Never Used  . Alcohol use 0.6 oz/week    1 Glasses of wine per week     Comment: daily  . Drug use: No  . Sexual activity: Not on file   Other Topics Concern  . Not on file   Social History Narrative    Patient is married and has 2 children.   Patient is right handed.   Patient has Bachelor's degree.   Patient drinks 2 cups daily.     Physical Exam: BP 136/74 (BP Location: Right Arm, Patient Position: Sitting, Cuff Size: Normal)   Pulse 68   Ht 5\' 9"  (1.753 m) Comment: height measured without shoes  Wt 159 lb 2 oz (72.2 kg)   BMI 23.50 kg/m  Constitutional: generally well-appearing Psychiatric: alert and oriented x3 Abdomen: soft, nontender, nondistended, no obvious ascites, no peritoneal signs, normal bowel  sounds No peripheral edema noted in lower extremities  Assessment and plan: 83 y.o. male with recurrent lower GI bleeding, presumed diverticular, Plavix likely contributes  He has pan diverticulosis, most significant in the left colon.  He has had 3 bleeds.  Most recently they were separated by about 6 weeks.  Hopefully he will have no further bleeding but he absolutely needs to remain on Plavix according to his neurologist.  I agree with that.  He had a stroke and he is even having TIAs despite the Plavix.  I recommended he start taking an iron supplement once daily to get his blood counts up as high as possible.  I also recommend he can resume his usual high-fiber diet.  He knows to call here if he has overt bleeding or simply present to the emergency room again.  There are both very intelligent, nice people and seemed to understand the difficulty in his situation.  Clear need for Plavix and clear recurrent bleeding.  Cbc in two weeks.  Please see the "Patient Instructions" section for addition details about the plan.  Owens Loffler, MD [facility] 12/18/2016, 3:41 PM

## 2016-12-18 NOTE — Patient Instructions (Addendum)
Please start once daily over-the-counter iron supplement. Ferrous sulfate 325mg  pill.  CBC in 2 weeks.  Resume your regular diet and fiber supplement.  Normal BMI (Body Mass Index- based on height and weight) is between 23 and 30. Your BMI today is Body mass index is 23.5 kg/m. Marland Kitchen Please consider follow up  regarding your BMI with your Primary Care Provider.

## 2016-12-28 DIAGNOSIS — R69 Illness, unspecified: Secondary | ICD-10-CM | POA: Diagnosis not present

## 2016-12-29 DIAGNOSIS — R2681 Unsteadiness on feet: Secondary | ICD-10-CM | POA: Diagnosis not present

## 2016-12-29 DIAGNOSIS — K5791 Diverticulosis of intestine, part unspecified, without perforation or abscess with bleeding: Secondary | ICD-10-CM | POA: Diagnosis not present

## 2017-01-01 ENCOUNTER — Other Ambulatory Visit (INDEPENDENT_AMBULATORY_CARE_PROVIDER_SITE_OTHER): Payer: Medicare HMO

## 2017-01-01 DIAGNOSIS — K625 Hemorrhage of anus and rectum: Secondary | ICD-10-CM

## 2017-01-01 LAB — CBC WITH DIFFERENTIAL/PLATELET
BASOS PCT: 2.7 % (ref 0.0–3.0)
Basophils Absolute: 0.2 10*3/uL — ABNORMAL HIGH (ref 0.0–0.1)
Eosinophils Absolute: 1.2 10*3/uL — ABNORMAL HIGH (ref 0.0–0.7)
Eosinophils Relative: 12.7 % — ABNORMAL HIGH (ref 0.0–5.0)
HEMATOCRIT: 31.3 % — AB (ref 39.0–52.0)
Hemoglobin: 9.6 g/dL — ABNORMAL LOW (ref 13.0–17.0)
LYMPHS PCT: 32.9 % (ref 12.0–46.0)
Lymphs Abs: 3 10*3/uL (ref 0.7–4.0)
MCHC: 30.5 g/dL (ref 30.0–36.0)
MCV: 79.4 fl (ref 78.0–100.0)
MONOS PCT: 15 % — AB (ref 3.0–12.0)
Monocytes Absolute: 1.4 10*3/uL — ABNORMAL HIGH (ref 0.1–1.0)
NEUTROS ABS: 3.4 10*3/uL (ref 1.4–7.7)
Neutrophils Relative %: 36.7 % — ABNORMAL LOW (ref 43.0–77.0)
PLATELETS: 590 10*3/uL — AB (ref 150.0–400.0)
RBC: 3.94 Mil/uL — ABNORMAL LOW (ref 4.22–5.81)
RDW: 18.3 % — AB (ref 11.5–15.5)
WBC: 9.2 10*3/uL (ref 4.0–10.5)

## 2017-01-25 ENCOUNTER — Other Ambulatory Visit: Payer: Self-pay | Admitting: Internal Medicine

## 2017-01-27 DIAGNOSIS — R2681 Unsteadiness on feet: Secondary | ICD-10-CM | POA: Diagnosis not present

## 2017-01-27 DIAGNOSIS — K5791 Diverticulosis of intestine, part unspecified, without perforation or abscess with bleeding: Secondary | ICD-10-CM | POA: Diagnosis not present

## 2017-02-15 DIAGNOSIS — R0981 Nasal congestion: Secondary | ICD-10-CM | POA: Diagnosis not present

## 2017-02-15 DIAGNOSIS — R05 Cough: Secondary | ICD-10-CM | POA: Diagnosis not present

## 2017-02-18 DIAGNOSIS — J324 Chronic pansinusitis: Secondary | ICD-10-CM | POA: Diagnosis not present

## 2017-03-29 ENCOUNTER — Telehealth: Payer: Self-pay | Admitting: Gastroenterology

## 2017-03-29 DIAGNOSIS — K922 Gastrointestinal hemorrhage, unspecified: Secondary | ICD-10-CM

## 2017-03-29 NOTE — Telephone Encounter (Signed)
The pt advised he can come in for labs at his convenience.  He does not need and appt

## 2017-03-29 NOTE — Telephone Encounter (Signed)
Patient states he was suppose to receive a call from [place] about scheduling blood work. Pt wanting to know if he can come in this Wednesday and have it done.

## 2017-03-31 ENCOUNTER — Other Ambulatory Visit: Payer: Medicare HMO

## 2017-03-31 ENCOUNTER — Other Ambulatory Visit: Payer: Self-pay

## 2017-03-31 ENCOUNTER — Other Ambulatory Visit (INDEPENDENT_AMBULATORY_CARE_PROVIDER_SITE_OTHER): Payer: Medicare HMO

## 2017-03-31 DIAGNOSIS — D509 Iron deficiency anemia, unspecified: Secondary | ICD-10-CM

## 2017-03-31 DIAGNOSIS — K922 Gastrointestinal hemorrhage, unspecified: Secondary | ICD-10-CM

## 2017-03-31 DIAGNOSIS — Z0000 Encounter for general adult medical examination without abnormal findings: Secondary | ICD-10-CM

## 2017-03-31 LAB — CBC WITH DIFFERENTIAL/PLATELET
BASOS PCT: 0.6 % (ref 0.0–3.0)
Basophils Absolute: 0.1 10*3/uL (ref 0.0–0.1)
EOS ABS: 0.4 10*3/uL (ref 0.0–0.7)
EOS PCT: 3.2 % (ref 0.0–5.0)
HEMATOCRIT: 45.2 % (ref 39.0–52.0)
HEMOGLOBIN: 14.3 g/dL (ref 13.0–17.0)
LYMPHS PCT: 21.8 % (ref 12.0–46.0)
Lymphs Abs: 2.8 10*3/uL (ref 0.7–4.0)
MCHC: 31.5 g/dL (ref 30.0–36.0)
MCV: 83.1 fl (ref 78.0–100.0)
Monocytes Absolute: 1.1 10*3/uL — ABNORMAL HIGH (ref 0.1–1.0)
Monocytes Relative: 9.1 % (ref 3.0–12.0)
NEUTROS ABS: 8.3 10*3/uL — AB (ref 1.4–7.7)
Neutrophils Relative %: 65.3 % (ref 43.0–77.0)
PLATELETS: 334 10*3/uL (ref 150.0–400.0)
RBC: 5.43 Mil/uL (ref 4.22–5.81)
RDW: 24.1 % — AB (ref 11.5–15.5)
WBC: 12.7 10*3/uL — AB (ref 4.0–10.5)

## 2017-04-01 LAB — PATHOLOGIST SMEAR REVIEW

## 2017-04-07 DIAGNOSIS — E7800 Pure hypercholesterolemia, unspecified: Secondary | ICD-10-CM | POA: Diagnosis not present

## 2017-04-07 DIAGNOSIS — K5791 Diverticulosis of intestine, part unspecified, without perforation or abscess with bleeding: Secondary | ICD-10-CM | POA: Diagnosis not present

## 2017-04-07 DIAGNOSIS — I519 Heart disease, unspecified: Secondary | ICD-10-CM | POA: Diagnosis not present

## 2017-04-07 DIAGNOSIS — E119 Type 2 diabetes mellitus without complications: Secondary | ICD-10-CM | POA: Diagnosis not present

## 2017-04-07 DIAGNOSIS — G459 Transient cerebral ischemic attack, unspecified: Secondary | ICD-10-CM | POA: Diagnosis not present

## 2017-04-07 DIAGNOSIS — I10 Essential (primary) hypertension: Secondary | ICD-10-CM | POA: Diagnosis not present

## 2017-04-07 DIAGNOSIS — R350 Frequency of micturition: Secondary | ICD-10-CM | POA: Diagnosis not present

## 2017-04-12 DIAGNOSIS — M25562 Pain in left knee: Secondary | ICD-10-CM | POA: Diagnosis not present

## 2017-05-04 DIAGNOSIS — N401 Enlarged prostate with lower urinary tract symptoms: Secondary | ICD-10-CM | POA: Diagnosis not present

## 2017-05-04 DIAGNOSIS — R351 Nocturia: Secondary | ICD-10-CM | POA: Diagnosis not present

## 2017-05-04 DIAGNOSIS — N3000 Acute cystitis without hematuria: Secondary | ICD-10-CM | POA: Diagnosis not present

## 2017-05-25 DIAGNOSIS — J45991 Cough variant asthma: Secondary | ICD-10-CM | POA: Diagnosis not present

## 2017-05-25 DIAGNOSIS — R69 Illness, unspecified: Secondary | ICD-10-CM | POA: Diagnosis not present

## 2017-05-25 DIAGNOSIS — H0289 Other specified disorders of eyelid: Secondary | ICD-10-CM | POA: Diagnosis not present

## 2017-05-26 ENCOUNTER — Ambulatory Visit: Payer: Medicare HMO | Admitting: Neurology

## 2017-05-26 ENCOUNTER — Encounter: Payer: Self-pay | Admitting: Neurology

## 2017-05-26 ENCOUNTER — Telehealth: Payer: Self-pay

## 2017-05-26 DIAGNOSIS — R05 Cough: Secondary | ICD-10-CM | POA: Diagnosis not present

## 2017-05-26 DIAGNOSIS — R062 Wheezing: Secondary | ICD-10-CM | POA: Diagnosis not present

## 2017-05-26 NOTE — Telephone Encounter (Signed)
Pts wife call the same day. Pt has the flu.

## 2017-06-07 DIAGNOSIS — Z20828 Contact with and (suspected) exposure to other viral communicable diseases: Secondary | ICD-10-CM | POA: Diagnosis not present

## 2017-06-07 DIAGNOSIS — J069 Acute upper respiratory infection, unspecified: Secondary | ICD-10-CM | POA: Diagnosis not present

## 2017-06-09 ENCOUNTER — Ambulatory Visit: Payer: Medicare HMO | Admitting: Neurology

## 2017-06-09 ENCOUNTER — Encounter: Payer: Self-pay | Admitting: Neurology

## 2017-06-09 VITALS — BP 122/72 | HR 87 | Ht 70.0 in | Wt 156.8 lb

## 2017-06-09 DIAGNOSIS — R69 Illness, unspecified: Secondary | ICD-10-CM | POA: Diagnosis not present

## 2017-06-09 DIAGNOSIS — G8110 Spastic hemiplegia affecting unspecified side: Secondary | ICD-10-CM | POA: Diagnosis not present

## 2017-06-09 NOTE — Progress Notes (Signed)
[facility]  PATIENT: [name] DOB: August 26, 1931   REASON FOR VISIT: Follow-up for possible TIA last weekend and slurred speech right hand numbness for 30 minutes HISTORY FROM:    HISTORY OF PRESENT ILLNESS:UPDATE 07/22/2016.CM [name], 84 year old male returns for follow-up with possible TIA event over the weekend where he had slurred speech and right hand numbness for 30 minutes. He is currently on aspirin 325 mg daily. He is on Zocor for hyperlipidemia and is due to have labs next week his primary care. Blood pressure in the office today 149/78 he is continuing to get physical therapy 2 times a week for left lower extremity weakness since admission for stroke and 02/22/2016 . Carotid Doppler at that time 1-39% bilateral ICA stenosis negative MRA of the head MRI with right lateral thalamus and right posterior limb internal capsule infarct. He had another admission to the ER on 05/04/2016 for aphasia/ TIA. MRI without acute infarct. Marland Kitchen He lives in independent living at [facility]. He returns for reevaluation   HISTORY 07/2015 Dr. Cyndra Numbers had acute onset of dizziness on 07/25/15 and was admitted to [facility]. MRI showed tiny acute infarct in the superior left occipital lobe. CTA head and neck unremarkable. EF 45-50%, A1C 6.5, LDL not checked. His investigational meds were discontinued. He was put on plavix. Further embolic work up deferred as he would like to be discharged the 2nd day.   Discussed with pt and his wife that he had recurrent episodes of word finding difficulty. MRI on 12/22/13 showed acute infarct at left temporal cortical region. He was enrolled to RESPECT ESUS trial to compare ASA vs. pradaxa in 04/2014. However, he had another TIA episode with word finding difficulty and MRI negative. He was managed to be remained in the trial. This time his symptoms are different from previous, but MRI showed acute tiny infarct at left occipital lobe. He had 30 day cardiac monitoring  in the past with Dr. Wynonia Lawman was told negative for afib. During to recurrent episodes and embolic pattern, we can either put him on coumadin and INR 2-3 or continue plavix and check TEE and consider loop recorder. Pros and cons of either approach have been discussed with pt and wife. Pt more leaning towards coumadin and wife leaning toward the other. They would like to further discuss with PCP Dr. Delfina Redwood and let me know. I will forward the note to Dr. Delfina Redwood today and they will call him next week.   Update 11/25/2016 :  He returns for follow-up after last visit 4 months ago. He is accompanied by his wife. His abnormal further recurrent TIA or stroke symptoms. He had to stop Plavix for a week as he had some rectal bleed which was felt to be diverticular bleed. He has since resumed Plavix and is doing all right without further bleeding problems. He continues to walk with a walker due to stiffness and weakness in left leg as well as left knee pain which is bothersome. He recently got intra-articular injection by Dr.: Into his left knee. Uses a wheeled walker regularly. He states is careful and has not had any recent falls. He is complaining of mild short-term memory difficulties as well as double finding words and completing sentences. He does not participate in any mentally challenging activities. Update the 06/09/2017 :he returns for follow-up after last for this 6 months ago. He is accompanied by his wife. He has not had any recurrent stroke or TIA symptoms. He continues to have  left leg weakness and difficulty with gait and balance. He does use a wheeled walker all the time. He has not had any falls or injuries recently. He states his memory difficulties unchanged and are not progressive. His remains on Plavix which is tolerating well with only minor bruising but no bleeding. States his blood pressure is under good control today it is 124/72. He is tolerating Zocor well and last lipid profile checked for  satisfactory by his primary physician. He was diagnosed with pneumonia 2 weeks ago by his primary physician and given a course of antibiotics and is now improving.  REVIEW OF SYSTEMS: Full 14 system review of systems performed and notable only for those listed, all others are neg:   Speech difficulty, memory loss,   gait difficulty and all other systems negative  ALLERGIES: Allergies  Allergen Reactions  . Azithromycin Rash and Other (See Comments)    Possible reaction to Z-Pak    HOME MEDICATIONS: Outpatient Medications Prior to Visit  Medication Sig Dispense Refill  . acetaminophen (TYLENOL) 500 MG tablet Take 1,000 mg by mouth every 6 (six) hours as needed for headache (pain).    Marland Kitchen albuterol (PROAIR HFA) 108 (90 Base) MCG/ACT inhaler Inhale 2 puffs into the lungs every 6 (six) hours as needed for wheezing or shortness of breath.    Marland Kitchen albuterol (PROVENTIL HFA) 108 (90 Base) MCG/ACT inhaler Inhale into the lungs.    . baclofen (LIORESAL) 10 MG tablet Take 0.5 tablets (5 mg total) by mouth at bedtime as needed for muscle spasms. (Patient taking differently: Take 10 mg by mouth at bedtime as needed for muscle spasms. ) 30 each 0  . carvedilol (COREG) 3.125 MG tablet Take 1 tablet (3.125 mg total) by mouth 2 (two) times daily with a meal. 60 tablet 1  . cetirizine (ZYRTEC) 10 MG tablet Take 10 mg by mouth daily.    . clopidogrel (PLAVIX) 75 MG tablet Take 1 tablet (75 mg total) by mouth daily. 30 tablet 11  . fluticasone (FLONASE) 50 MCG/ACT nasal spray Place 1 spray into both nostrils at bedtime.   5  . guaiFENesin (MUCINEX) 600 MG 12 hr tablet Take 600 mg by mouth daily.     . Melatonin 1 MG TABS Take by mouth.    . mometasone-formoterol (DULERA) 100-5 MCG/ACT AERO Inhale 2 puffs into the lungs 2 (two) times daily. 1 Inhaler 0  . montelukast (SINGULAIR) 10 MG tablet TAKE 1 TABLET (10 MG TOTAL) BY MOUTH AT BEDTIME. 30 tablet 10  . Omega-3 Fatty Acids (FISH OIL) 500 MG CAPS Take 500 mg by  mouth daily.    Marland Kitchen omeprazole (PRILOSEC) 40 MG capsule Take by mouth as needed.     . Oseltamivir Phosphate (TAMIFLU PO) Take by mouth.    . psyllium (METAMUCIL) 58.6 % powder Take 1 packet by mouth 3 (three) times daily.    Marland Kitchen rOPINIRole (REQUIP) 0.25 MG tablet Take 1 tablet (0.25 mg total) by mouth at bedtime. (Patient taking differently: Take 0.5 mg by mouth at bedtime. ) 30 tablet 0  . simvastatin (ZOCOR) 40 MG tablet Take 1 tablet (40 mg total) by mouth daily. (Patient taking differently: Take 40 mg by mouth at bedtime. ) 30 tablet 3  . zolpidem (AMBIEN) 10 MG tablet Take 5-10 mg by mouth at bedtime as needed for sleep.     Marland Kitchen aspirin EC 325 MG tablet Take by mouth.    . cefdinir (OMNICEF) 300 MG capsule     .  guaiFENesin (MUCINEX) 600 MG 12 hr tablet Take by mouth.    . polyethylene glycol (MIRALAX / GLYCOLAX) packet Take 17 g by mouth daily as needed for mild constipation. (Patient not taking: Reported on 06/09/2017) 14 each 0  . predniSONE (DELTASONE) 20 MG tablet     . rOPINIRole (REQUIP) 0.5 MG tablet     . senna (SENOKOT) 8.6 MG TABS tablet Take 1 tablet (8.6 mg total) by mouth 2 (two) times daily. (Patient not taking: Reported on 06/09/2017) 120 each 0  . warfarin (COUMADIN) 5 MG tablet Take by mouth.     No facility-administered medications prior to visit.     PAST MEDICAL HISTORY: Past Medical History:  Diagnosis Date  . Acute CVA (cerebrovascular accident) ([place]) 07/25/2015  . Asthma   . Benign essential HTN   . Cerebral infarction due to embolism of left middle cerebral artery ([place]) 02/03/2014  . Colitis   . Colon polyps   . Diabetes mellitus type 2, diet-controlled ([place]) 12/22/2013  . Diverticulosis   . GERD (gastroesophageal reflux disease)   . GI bleed   . Hemorrhoids   . Hiatal hernia   . History of esophageal strciture   . HLD (hyperlipidemia)   . Osteoarthritis   . Proctitis   . Status post dilation of esophageal narrowing   . Stroke [facility])     PAST SURGICAL  HISTORY: Past Surgical History:  Procedure Laterality Date  . ABDOMINAL HERNIA REPAIR    . COLONOSCOPY     multiple  . COLONOSCOPY WITH PROPOFOL N/A 12/12/2016   Procedure: COLONOSCOPY WITH PROPOFOL;  Surgeon: Gatha Mayer, MD;  Location: [facility] ENDOSCOPY;  Service: Endoscopy;  Laterality: N/A;  . ESOPHAGOGASTRODUODENOSCOPY     multiple  . INSERTION OF MESH  01/29/2012   Procedure: INSERTION OF MESH;  Surgeon: Gwenyth Ober, MD;  Location: [place];  Service: General;  Laterality: N/A;  . NISSEN FUNDOPLICATION    . SPLENECTOMY, TOTAL     nontraumatic rupture  . VENTRAL HERNIA REPAIR  01/29/2012    WITH MESH  . VENTRAL HERNIA REPAIR  01/29/2012   Procedure: HERNIA REPAIR VENTRAL ADULT;  Surgeon: Gwenyth Ober, MD;  Location: [place];  Service: General;  Laterality: N/A;  open recurrent ventral hernia repair with mesh    FAMILY HISTORY: Family History  Problem Relation Age of Onset  . Cancer Father     SOCIAL HISTORY: Social History   Socioeconomic History  . Marital status: Married    Spouse name: [name]  . Number of children: 2  . Years of education: Bachelor's  . Highest education level: Not on file  Occupational History  . Occupation: retired  Scientific laboratory technician  . Financial resource strain: Not on file  . Food insecurity:    Worry: Not on file    Inability: Not on file  . Transportation needs:    Medical: Not on file    Non-medical: Not on file  Tobacco Use  . Smoking status: Never Smoker  . Smokeless tobacco: Never Used  Substance and Sexual Activity  . Alcohol use: Yes    Alcohol/week: 0.6 oz    Types: 1 Glasses of wine per week    Comment: daily  . Drug use: No  . Sexual activity: Not on file  Lifestyle  . Physical activity:    Days per week: Not on file    Minutes per session: Not on file  . Stress: Not on file  Relationships  . Social connections:  Talks on phone: Not on file    Gets together: Not on file    Attends religious service: Not on file     Active member of club or organization: Not on file    Attends meetings of clubs or organizations: Not on file    Relationship status: Not on file  . Intimate partner violence:    Fear of current or ex partner: Not on file    Emotionally abused: Not on file    Physically abused: Not on file    Forced sexual activity: Not on file  Other Topics Concern  . Not on file  Social History Narrative   Patient is married and has 2 children.   Patient is right handed.   Patient has Bachelor's degree.   Patient drinks 2 cups daily.     PHYSICAL EXAM  Vitals:   06/09/17 1310  BP: 122/72  Pulse: 87  Weight: 156 lb 12.8 oz (71.1 kg)  Height: 5\' 10"  (1.778 m)   Body mass index is 22.5 kg/m.  Generalized: frail elderly Caucasian male, in no acute distress  Head: normocephalic and atraumatic,.   Neck: Supple, no carotid bruits  Cardiac: Regular rate rhythm, no murmur  Musculoskeletal: No deformity . Mild kyphosis  Neurological examination   Mentation: Alert oriented to time, place, history taking. Attention span and concentration appropriate. Recent and remote memory intact.  Follows all commands speech and language fluent. Diminished recall 2/3. Able to name 7 animals with 4 legs. Clock drawing 3/4.  Cranial nerve II-XII: Fundoscopic exam not donePupils were equal round reactive to light extraocular movements were full, visual field were full on confrontational test. Facial sensation and strength were normal. hearing was intact to finger rubbing bilaterally. Uvula tongue midline. head turning and shoulder shrug were normal and symmetric.Tongue protrusion into cheek strength was normal. Motor: normal bulk and tone, full strength in the  UE,  LE, on the right, M.ild weakness in the left upper extremity in the grip and intrinsic hand muscles. Orbits right over left approximately. Fine finger movements are diminished on the left.3-5  left lower extremity with foot drop. AFO and knee bracein place  , 4 out of 5 left upper extremity. Mild spasticity left leg with increased tone. Sensory: normal and symmetric to light touch, in the upper and lower extremities Coordination: finger-nose-finger,  no dysmetria Reflexes: Brisker in the left upper and lower, plantar responses were flexor bilaterally. Gait and Station: Rising up from seated position with push off.uses a wheeled walker and drags left foot. DIAGNOSTIC DATA (LABS, IMAGING, TESTING) - I reviewed patient records, labs, notes, testing and imaging myself where available.  Lab Results  Component Value Date   WBC 12.7 (H) 03/31/2017   HGB 14.3 03/31/2017   HCT 45.2 03/31/2017   MCV 83.1 03/31/2017   PLT 334.0 03/31/2017      Component Value Date/Time   NA 137 12/13/2016 0333   K 3.4 (L) 12/13/2016 0333   CL 107 12/13/2016 0333   CO2 23 12/13/2016 0333   GLUCOSE 109 (H) 12/13/2016 0333   BUN 12 12/13/2016 0333   CREATININE 0.74 12/13/2016 0333   CREATININE 0.74 02/08/2012 1209   CALCIUM 7.8 (L) 12/13/2016 0333   PROT 5.4 (L) 12/11/2016 1535   ALBUMIN 3.1 (L) 12/11/2016 1535   AST 21 12/11/2016 1535   ALT 10 (L) 12/11/2016 1535   ALKPHOS 55 12/11/2016 1535   BILITOT 0.7 12/11/2016 1535   GFRNONAA >60 12/13/2016 0333   GFRAA >  60 12/13/2016 0333   Lab Results  Component Value Date   CHOL 179 02/23/2016   HDL 59 02/23/2016   LDLCALC 99 02/23/2016   TRIG 103 02/23/2016   CHOLHDL 3.0 02/23/2016   Lab Results  Component Value Date   HGBA1C 6.2 (H) 02/23/2016     ASSESSMENT AND PLAN  84 y.o. year old male  has a past medical history of Acute CVA (cerebrovascular accident) ([place]) (07/25/2015);  Benign essential HTN; Cerebral infarction due to embolism of left middle cerebral artery ([facility]) (02/03/2014);  HLD (hyperlipidemia); Osteoarthritis;  and Stroke ([place]).TIA in May 2018.He has residual gait and balance difficulties. Mild short-term memory and speech difficulties due to age-appropriate mild cognitive impairment which  appears stable   I had a long d/w patient about his remote strokes and gait difficulty, risk for recurrent stroke/TIAs, personally independently reviewed imaging studies and stroke evaluation results and answered questions.Continue Plavixl 75 mg daily  for secondary stroke prevention and maintain strict control of hypertension with blood pressure goal below 130/90, diabetes with hemoglobin A1c goal below 6.5% and lipids with LDL cholesterol goal below 70 mg/dL. I also advised the patient to eat a healthy diet with plenty of whole grains, cereals, fruits and vegetables, exercise regularly and maintain ideal body weight he was advised to use his wheeled walker at all times for ambulation and we discussed fall and safety precautions.Followup in the future with me in one year or call earlier if necessary.Greater than 50% time during this 25 minute visit was spent on counseling and coordination of care about his strokes, gait and balance difficulties in answering questions.  Antony Contras, MD [facility] [address], [place], [place] [address] [phone]

## 2017-06-09 NOTE — Patient Instructions (Signed)
I had a long d/w patient about his remote strokes and gait difficulty, risk for recurrent stroke/TIAs, personally independently reviewed imaging studies and stroke evaluation results and answered questions.Continue Plavixl 75 mg daily  for secondary stroke prevention and maintain strict control of hypertension with blood pressure goal below 130/90, diabetes with hemoglobin A1c goal below 6.5% and lipids with LDL cholesterol goal below 70 mg/dL. I also advised the patient to eat a healthy diet with plenty of whole grains, cereals, fruits and vegetables, exercise regularly and maintain ideal body weight he was advised to use his wheeled walker at all times for ambulation and we discussed fall and safety precautions.Followup in the future with me in one year or call earlier if necessary  Fall Prevention in the [place] can cause injuries. They can happen to people of all ages. There are many things you can do to make your home safe and to help prevent falls. What can I do on the outside of my home?  Regularly fix the edges of walkways and driveways and fix any cracks.  Remove anything that might make you trip as you walk through a door, such as a raised step or threshold.  Trim any bushes or trees on the path to your home.  Use bright outdoor lighting.  Clear any walking paths of anything that might make someone trip, such as rocks or tools.  Regularly check to see if handrails are loose or broken. Make sure that both sides of any steps have handrails.  Any raised decks and porches should have guardrails on the edges.  Have any leaves, snow, or ice cleared regularly.  Use sand or salt on walking paths during winter.  Clean up any spills in your garage right away. This includes oil or grease spills. What can I do in the bathroom?  Use night lights.  Install grab bars by the toilet and in the tub and shower. Do not use towel bars as grab bars.  Use non-skid mats or decals in the tub or  shower.  If you need to sit down in the shower, use a plastic, non-slip stool.  Keep the floor dry. Clean up any water that spills on the floor as soon as it happens.  Remove soap buildup in the tub or shower regularly.  Attach bath mats securely with double-sided non-slip rug tape.  Do not have throw rugs and other things on the floor that can make you trip. What can I do in the bedroom?  Use night lights.  Make sure that you have a light by your bed that is easy to reach.  Do not use any sheets or blankets that are too big for your bed. They should not hang down onto the floor.  Have a firm chair that has side arms. You can use this for support while you get dressed.  Do not have throw rugs and other things on the floor that can make you trip. What can I do in the kitchen?  Clean up any spills right away.  Avoid walking on wet floors.  Keep items that you use a lot in easy-to-reach places.  If you need to reach something above you, use a strong step stool that has a grab bar.  Keep electrical cords out of the way.  Do not use floor polish or wax that makes floors slippery. If you must use wax, use non-skid floor wax.  Do not have throw rugs and other things on the floor that can  make you trip. What can I do with my stairs?  Do not leave any items on the stairs.  Make sure that there are handrails on both sides of the stairs and use them. Fix handrails that are broken or loose. Make sure that handrails are as long as the stairways.  Check any carpeting to make sure that it is firmly attached to the stairs. Fix any carpet that is loose or worn.  Avoid having throw rugs at the top or bottom of the stairs. If you do have throw rugs, attach them to the floor with carpet tape.  Make sure that you have a light switch at the top of the stairs and the bottom of the stairs. If you do not have them, ask someone to add them for you. What else can I do to help prevent  falls?  Wear shoes that: ? Do not have high heels. ? Have rubber bottoms. ? Are comfortable and fit you well. ? Are closed at the toe. Do not wear sandals.  If you use a stepladder: ? Make sure that it is fully opened. Do not climb a closed stepladder. ? Make sure that both sides of the stepladder are locked into place. ? Ask someone to hold it for you, if possible.  Clearly mark and make sure that you can see: ? Any grab bars or handrails. ? First and last steps. ? Where the edge of each step is.  Use tools that help you move around (mobility aids) if they are needed. These include: ? Canes. ? Walkers. ? Scooters. ? Crutches.  Turn on the lights when you go into a dark area. Replace any light bulbs as soon as they burn out.  Set up your furniture so you have a clear path. Avoid moving your furniture around.  If any of your floors are uneven, fix them.  If there are any pets around you, be aware of where they are.  Review your medicines with your doctor. Some medicines can make you feel dizzy. This can increase your chance of falling. Ask your doctor what other things that you can do to help prevent falls. This information is not intended to replace advice given to you by your health care provider. Make sure you discuss any questions you have with your health care provider. Document Released: 11/29/2008 Document Revised: 07/11/2015 Document Reviewed: 03/09/2014 Elsevier Interactive Patient Education  [employer].

## 2017-06-15 ENCOUNTER — Encounter: Payer: Self-pay | Admitting: Gastroenterology

## 2017-06-16 DIAGNOSIS — Z961 Presence of intraocular lens: Secondary | ICD-10-CM | POA: Diagnosis not present

## 2017-06-16 DIAGNOSIS — H35372 Puckering of macula, left eye: Secondary | ICD-10-CM | POA: Diagnosis not present

## 2017-06-16 DIAGNOSIS — H01022 Squamous blepharitis right lower eyelid: Secondary | ICD-10-CM | POA: Diagnosis not present

## 2017-06-16 DIAGNOSIS — H01024 Squamous blepharitis left upper eyelid: Secondary | ICD-10-CM | POA: Diagnosis not present

## 2017-06-16 DIAGNOSIS — H01025 Squamous blepharitis left lower eyelid: Secondary | ICD-10-CM | POA: Diagnosis not present

## 2017-06-16 DIAGNOSIS — E119 Type 2 diabetes mellitus without complications: Secondary | ICD-10-CM | POA: Diagnosis not present

## 2017-06-16 DIAGNOSIS — H01021 Squamous blepharitis right upper eyelid: Secondary | ICD-10-CM | POA: Diagnosis not present

## 2017-06-16 DIAGNOSIS — H0015 Chalazion left lower eyelid: Secondary | ICD-10-CM | POA: Diagnosis not present

## 2017-06-16 DIAGNOSIS — H43813 Vitreous degeneration, bilateral: Secondary | ICD-10-CM | POA: Diagnosis not present

## 2017-06-16 DIAGNOSIS — I63411 Cerebral infarction due to embolism of right middle cerebral artery: Secondary | ICD-10-CM | POA: Diagnosis not present

## 2017-06-22 DIAGNOSIS — D044 Carcinoma in situ of skin of scalp and neck: Secondary | ICD-10-CM | POA: Diagnosis not present

## 2017-06-22 DIAGNOSIS — L821 Other seborrheic keratosis: Secondary | ICD-10-CM | POA: Diagnosis not present

## 2017-06-22 DIAGNOSIS — D485 Neoplasm of uncertain behavior of skin: Secondary | ICD-10-CM | POA: Diagnosis not present

## 2017-06-22 DIAGNOSIS — Z85828 Personal history of other malignant neoplasm of skin: Secondary | ICD-10-CM | POA: Diagnosis not present

## 2017-07-15 DIAGNOSIS — J45991 Cough variant asthma: Secondary | ICD-10-CM | POA: Diagnosis not present

## 2017-07-17 DIAGNOSIS — R69 Illness, unspecified: Secondary | ICD-10-CM | POA: Diagnosis not present

## 2017-08-06 DIAGNOSIS — J4520 Mild intermittent asthma, uncomplicated: Secondary | ICD-10-CM | POA: Diagnosis not present

## 2017-08-06 DIAGNOSIS — I10 Essential (primary) hypertension: Secondary | ICD-10-CM | POA: Diagnosis not present

## 2017-08-06 DIAGNOSIS — E7800 Pure hypercholesterolemia, unspecified: Secondary | ICD-10-CM | POA: Diagnosis not present

## 2017-08-06 DIAGNOSIS — G459 Transient cerebral ischemic attack, unspecified: Secondary | ICD-10-CM | POA: Diagnosis not present

## 2017-08-06 DIAGNOSIS — E1169 Type 2 diabetes mellitus with other specified complication: Secondary | ICD-10-CM | POA: Diagnosis not present

## 2017-09-13 DIAGNOSIS — H938X2 Other specified disorders of left ear: Secondary | ICD-10-CM | POA: Diagnosis not present

## 2017-09-13 DIAGNOSIS — R05 Cough: Secondary | ICD-10-CM | POA: Diagnosis not present

## 2017-09-13 DIAGNOSIS — J9801 Acute bronchospasm: Secondary | ICD-10-CM | POA: Diagnosis not present

## 2017-09-17 DIAGNOSIS — J45991 Cough variant asthma: Secondary | ICD-10-CM | POA: Diagnosis not present

## 2017-09-20 DIAGNOSIS — R69 Illness, unspecified: Secondary | ICD-10-CM | POA: Diagnosis not present

## 2017-09-29 ENCOUNTER — Other Ambulatory Visit: Payer: Self-pay | Admitting: Internal Medicine

## 2017-10-05 ENCOUNTER — Other Ambulatory Visit: Payer: Self-pay | Admitting: Internal Medicine

## 2017-10-05 DIAGNOSIS — R69 Illness, unspecified: Secondary | ICD-10-CM | POA: Diagnosis not present

## 2017-10-05 MED ORDER — MONTELUKAST SODIUM 10 MG PO TABS: 10.0000 mg | ORAL_TABLET | Freq: Every day | ORAL | 12 refills | Status: DC

## 2017-10-18 DIAGNOSIS — R69 Illness, unspecified: Secondary | ICD-10-CM | POA: Diagnosis not present

## 2017-11-03 DIAGNOSIS — R69 Illness, unspecified: Secondary | ICD-10-CM | POA: Diagnosis not present

## 2017-11-04 ENCOUNTER — Telehealth: Payer: Self-pay | Admitting: Gastroenterology

## 2017-11-04 DIAGNOSIS — D509 Iron deficiency anemia, unspecified: Secondary | ICD-10-CM

## 2017-11-04 DIAGNOSIS — K922 Gastrointestinal hemorrhage, unspecified: Secondary | ICD-10-CM

## 2017-11-04 DIAGNOSIS — K625 Hemorrhage of anus and rectum: Secondary | ICD-10-CM

## 2017-11-04 NOTE — Telephone Encounter (Signed)
Left message on machine to call back  

## 2017-11-04 NOTE — Telephone Encounter (Signed)
The pt states he has had several episodes of BRB per rectum over the past 2 days.  He states "it is a good amount"  He was due for CBC in May and never had completed.  Appt made for tomorrow and CBC prior to that appt. He states he feels fine, no other GI complaints.  He is aware to go to the ED if bleeding becomes heavy, he develops chest pain or SOB.

## 2017-11-04 NOTE — Telephone Encounter (Signed)
Patient states he is still having rectal bleeding and wants advice on what to do next. Pt states he also saw APP Anderson Malta in the [place].

## 2017-11-05 ENCOUNTER — Ambulatory Visit: Payer: Medicare HMO | Admitting: Physician Assistant

## 2017-11-05 ENCOUNTER — Other Ambulatory Visit (INDEPENDENT_AMBULATORY_CARE_PROVIDER_SITE_OTHER): Payer: Medicare HMO

## 2017-11-05 ENCOUNTER — Encounter: Payer: Self-pay | Admitting: Physician Assistant

## 2017-11-05 VITALS — BP 120/80 | HR 90 | Ht 69.0 in | Wt 157.4 lb

## 2017-11-05 DIAGNOSIS — J40 Bronchitis, not specified as acute or chronic: Secondary | ICD-10-CM | POA: Diagnosis not present

## 2017-11-05 DIAGNOSIS — D509 Iron deficiency anemia, unspecified: Secondary | ICD-10-CM | POA: Diagnosis not present

## 2017-11-05 DIAGNOSIS — K625 Hemorrhage of anus and rectum: Secondary | ICD-10-CM

## 2017-11-05 DIAGNOSIS — Z8719 Personal history of other diseases of the digestive system: Secondary | ICD-10-CM | POA: Diagnosis not present

## 2017-11-05 DIAGNOSIS — K922 Gastrointestinal hemorrhage, unspecified: Secondary | ICD-10-CM | POA: Diagnosis not present

## 2017-11-05 LAB — CBC WITH DIFFERENTIAL/PLATELET
BASOS ABS: 0.2 10*3/uL — AB (ref 0.0–0.1)
Basophils Relative: 1.2 % (ref 0.0–3.0)
Eosinophils Absolute: 2.8 10*3/uL — ABNORMAL HIGH (ref 0.0–0.7)
Eosinophils Relative: 21.2 % — ABNORMAL HIGH (ref 0.0–5.0)
HCT: 50.3 % (ref 39.0–52.0)
HEMOGLOBIN: 16.8 g/dL (ref 13.0–17.0)
Lymphocytes Relative: 22.1 % (ref 12.0–46.0)
Lymphs Abs: 2.9 10*3/uL (ref 0.7–4.0)
MCHC: 33.4 g/dL (ref 30.0–36.0)
MCV: 94.9 fl (ref 78.0–100.0)
MONO ABS: 1.2 10*3/uL — AB (ref 0.1–1.0)
Monocytes Relative: 8.6 % (ref 3.0–12.0)
Neutro Abs: 6.3 10*3/uL (ref 1.4–7.7)
Neutrophils Relative %: 46.9 % (ref 43.0–77.0)
Platelets: 349 10*3/uL (ref 150.0–400.0)
RBC: 5.3 Mil/uL (ref 4.22–5.81)
RDW: 14.1 % (ref 11.5–15.5)
WBC: 13.4 10*3/uL — AB (ref 4.0–10.5)

## 2017-11-05 NOTE — Progress Notes (Signed)
Chief Complaint: Rectal bleeding  Review of pertinent gastrointestinal problems:  1. routine risk for colon cancer. Colonoscopy 2003 with Dr. Inocente Salles, polyps removed but none were adenomatous. Colonoscopy June 2009 Dr. Ardis Hughs found no polyps. Significant left sided diverticulosis was noted.  2. esophageal ring, focal stricture dilated 2009 with deeper than usual mucosal tear, followup Gastrografin esophagram showed no extravasation  3. status post fundoplication  4. Diverticular hemorrhage in 2009, did not require blood transfusion; 2018 he was hospitalized twice for overt red rectal bleeding, presumed diverticular.  Hb nadir 6.8 (normally Hb 14). Colonoscopy 11/2016 Dr. Carlean Purl confirmed multiple left-sided diverticulosis pockets, the prep in the right side was poor.  There was no acute blood in the colon.  He also presumed that his acute  bleeding was diverticular in origin.  Chronic Plavix use probably contributes 5. Splenectomy; 2009, ruptured.  HPI:    [name] is an 84 year old male with a past medical history of CVA Plavix, diabetes and others listed below, known to Dr. Ardis Hughs, who presents to clinic today with a complaint of rectal bleeding.    12/18/2016 office visit Dr. Ardis Hughs to discuss recurrent lower GI bleeding.  This was thought to be diverticular in origin.  Apparently he had 4 bleeds last year.  It was recommended he start iron supplements daily.    Hemoglobin today 16.8.    Today, patient presents to clinic and explains that on Wednesday morning he woke up and had a bowel movement which had quite a lot of bright red blood on the outside of a dark stool.  He had no further bowel movements that day.  Yesterday he had another bowel movement with a small amount of bright red blood and this morning had just "a little bit of "old blood" around the outside of the stool.  Tells me that his stools are always somewhat dark due to his chronic use of iron for recurrent diverticular bleeds in the  past.  Denies any shortness of breath, palpitations or abdominal pain.    Medical history positive for being diagnosed with an upper respiratory infection recently started on antibiotic.  PCP recommended to start [place] but wanted our opinion.    Denies fever, chills, weight loss, anorexia, nausea, vomiting, heartburn, reflux or symptoms that awaken him from sleep.     Past Medical History:  Diagnosis Date  . Acute CVA (cerebrovascular accident) ([place]) 07/25/2015  . Asthma   . Benign essential HTN   . Cerebral infarction due to embolism of left middle cerebral artery ([place]) 02/03/2014  . Colitis   . Colon polyps   . Diabetes mellitus type 2, diet-controlled ([place]) 12/22/2013  . Diverticulosis   . GERD (gastroesophageal reflux disease)   . GI bleed   . Hemorrhoids   . Hiatal hernia   . History of esophageal strciture   . HLD (hyperlipidemia)   . Osteoarthritis   . Proctitis   . Status post dilation of esophageal narrowing   . Stroke [facility])     Past Surgical History:  Procedure Laterality Date  . ABDOMINAL HERNIA REPAIR    . COLONOSCOPY     multiple  . COLONOSCOPY WITH PROPOFOL N/A 12/12/2016   Procedure: COLONOSCOPY WITH PROPOFOL;  Surgeon: Gatha Mayer, MD;  [facility] ENDOSCOPY;  Service: Endoscopy;  Laterality: N/A;  . ESOPHAGOGASTRODUODENOSCOPY     multiple  . INSERTION OF MESH  01/29/2012   Procedure: INSERTION OF MESH;  Surgeon: Gwenyth Ober, MD;  Location: [place];  Service:  General;  Laterality: N/A;  . NISSEN FUNDOPLICATION    . SPLENECTOMY, TOTAL     nontraumatic rupture  . VENTRAL HERNIA REPAIR  01/29/2012    WITH MESH  . VENTRAL HERNIA REPAIR  01/29/2012   Procedure: HERNIA REPAIR VENTRAL ADULT;  Surgeon: Gwenyth Ober, MD;  Location: [place];  Service: General;  Laterality: N/A;  open recurrent ventral hernia repair with mesh    Current Outpatient Medications  Medication Sig Dispense Refill  . acetaminophen (TYLENOL) 500 MG tablet Take 1,000 mg by mouth every 6  (six) hours as needed for headache (pain).    Marland Kitchen albuterol (PROAIR HFA) 108 (90 Base) MCG/ACT inhaler Inhale 2 puffs into the lungs every 6 (six) hours as needed for wheezing or shortness of breath.    Marland Kitchen albuterol (PROVENTIL HFA) 108 (90 Base) MCG/ACT inhaler Inhale into the lungs.    . baclofen (LIORESAL) 10 MG tablet Take 0.5 tablets (5 mg total) by mouth at bedtime as needed for muscle spasms. (Patient taking differently: Take 10 mg by mouth at bedtime as needed for muscle spasms. ) 30 each 0  . carvedilol (COREG) 3.125 MG tablet Take 1 tablet (3.125 mg total) by mouth 2 (two) times daily with a meal. 60 tablet 1  . cetirizine (ZYRTEC) 10 MG tablet Take 10 mg by mouth daily.    . clopidogrel (PLAVIX) 75 MG tablet Take 1 tablet (75 mg total) by mouth daily. 30 tablet 11  . fluticasone (FLONASE) 50 MCG/ACT nasal spray Place 1 spray into both nostrils at bedtime.   5  . guaiFENesin (MUCINEX) 600 MG 12 hr tablet Take 600 mg by mouth daily.     . Melatonin 1 MG TABS Take by mouth.    . mometasone-formoterol (DULERA) 100-5 MCG/ACT AERO Inhale 2 puffs into the lungs 2 (two) times daily. 1 Inhaler 0  . montelukast (SINGULAIR) 10 MG tablet Take 1 tablet (10 mg total) by mouth at bedtime. 30 tablet 12  . Omega-3 Fatty Acids (FISH OIL) 500 MG CAPS Take 500 mg by mouth daily.    . Oseltamivir Phosphate (TAMIFLU PO) Take by mouth.    . psyllium (METAMUCIL) 58.6 % powder Take 1 packet by mouth 3 (three) times daily.    Marland Kitchen rOPINIRole (REQUIP) 0.25 MG tablet Take 1 tablet (0.25 mg total) by mouth at bedtime. (Patient taking differently: Take 0.5 mg by mouth at bedtime. ) 30 tablet 0  . simvastatin (ZOCOR) 40 MG tablet Take 1 tablet (40 mg total) by mouth daily. (Patient taking differently: Take 40 mg by mouth at bedtime. ) 30 tablet 3  . zolpidem (AMBIEN) 10 MG tablet Take 5-10 mg by mouth at bedtime as needed for sleep.     Marland Kitchen omeprazole (PRILOSEC) 40 MG capsule Take by mouth as needed.      No current  facility-administered medications for this visit.     Allergies as of 11/05/2017 - Review Complete 11/05/2017  Allergen Reaction Noted  . Azithromycin Rash and Other (See Comments) 03/08/2015    Family History  Problem Relation Age of Onset  . Cancer Father     Social History   Socioeconomic History  . Marital status: Married    Spouse name: [name]  . Number of children: 2  . Years of education: Bachelor's  . Highest education level: Not on file  Occupational History  . Occupation: retired  Scientific laboratory technician  . Financial resource strain: Not on file  . Food insecurity:    Worry:  Not on file    Inability: Not on file  . Transportation needs:    Medical: Not on file    Non-medical: Not on file  Tobacco Use  . Smoking status: Never Smoker  . Smokeless tobacco: Never Used  Substance and Sexual Activity  . Alcohol use: Yes    Alcohol/week: 1.0 standard drinks    Types: 1 Glasses of wine per week    Comment: daily  . Drug use: No  . Sexual activity: Not on file  Lifestyle  . Physical activity:    Days per week: Not on file    Minutes per session: Not on file  . Stress: Not on file  Relationships  . Social connections:    Talks on phone: Not on file    Gets together: Not on file    Attends religious service: Not on file    Active member of club or organization: Not on file    Attends meetings of clubs or organizations: Not on file    Relationship status: Not on file  . Intimate partner violence:    Fear of current or ex partner: Not on file    Emotionally abused: Not on file    Physically abused: Not on file    Forced sexual activity: Not on file  Other Topics Concern  . Not on file  Social History Narrative   Patient is married and has 2 children.   Patient is right handed.   Patient has Bachelor's degree.   Patient drinks 2 cups daily.    Review of Systems:    Constitutional: No weight loss, fever or chills Cardiovascular: No chest pain Respiratory: No SOB   Gastrointestinal: See HPI and otherwise negative   Physical Exam:  Vital signs: BP 120/80   Pulse 90   Ht 5\' 9"  (1.753 m)   Wt 157 lb 6.4 oz (71.4 kg)   SpO2 98%   BMI 23.24 kg/m   Constitutional:   Pleasant Elderly Caucasian male appears to be in NAD, Well developed, Well nourished, alert and cooperative Respiratory: Respirations even and unlabored. Lungs clear to auscultation bilaterally.   No wheezes, crackles, or rhonchi.  Cardiovascular: Normal S1, S2. No MRG. Regular rate and rhythm. No peripheral edema, cyanosis or pallor.  Gastrointestinal:  Soft, nondistended, nontender. No rebound or guarding. Normal bowel sounds. No appreciable masses or hepatomegaly. Rectal:  Not performed.  Psychiatric: Demonstrates good judgement and reason without abnormal affect or behaviors.  RELEVANT LABS AND IMAGING: CBC    Component Value Date/Time   WBC 13.4 (H) 11/05/2017 1420   RBC 5.30 11/05/2017 1420   HGB 16.8 11/05/2017 1420   HCT 50.3 11/05/2017 1420   PLT 349.0 11/05/2017 1420   MCV 94.9 11/05/2017 1420   MCH 24.6 (L) 12/13/2016 1138   MCHC 33.4 11/05/2017 1420   RDW 14.1 11/05/2017 1420   LYMPHSABS 2.9 11/05/2017 1420   MONOABS 1.2 (H) 11/05/2017 1420   EOSABS 2.8 (H) 11/05/2017 1420   BASOSABS 0.2 (H) 11/05/2017 1420   Assessment: 1.  Recurrent GI bleed: This is patient's likely fifth diverticular bleed over the past year, he must remain on Plavix due to complicated neurologic history with TIAs regardless of his Plavix, he has had recurrent diverticular bleeds in the past, most recently started 3 to days ago and is now slowing, hemoglobin is above normal at 16.8; likely diverticular bleed again, now stopping 2. History of Diverticular bleed  Plan: 1.  Discussed Align with the patient.  Recommend  that he start this once daily over the next month while he is on the antibiotic.  Provided him with a coupon. 2.  Explained to patient likely this is another diverticular bleed.  It  sounds as though it is slowing and his hemoglobin is normal and he is having no symptoms.  At this time would not recommend any further endoscopic evaluation. 3.  Discussed with patient that he should stay on his iron as prescribed. 4.  Patient should call her clinic and/ or proceed to the ER if he has an increased amount of bleeding or starts experiencing symptoms including palpitations, dizziness or syncope.  Follow up in the clinic as needed.  Ellouise Newer, PA-C [facility] 11/05/2017, 3:04 PM  Cc: Seward Carol, MD

## 2017-11-05 NOTE — Patient Instructions (Signed)
Start a daily probiotic such as Align once daily.

## 2017-11-07 NOTE — Progress Notes (Signed)
I agree with the above note, plan 

## 2017-11-08 DIAGNOSIS — J4520 Mild intermittent asthma, uncomplicated: Secondary | ICD-10-CM | POA: Diagnosis not present

## 2017-11-08 DIAGNOSIS — J9801 Acute bronchospasm: Secondary | ICD-10-CM | POA: Diagnosis not present

## 2017-11-08 DIAGNOSIS — R05 Cough: Secondary | ICD-10-CM | POA: Diagnosis not present

## 2017-11-08 DIAGNOSIS — J45991 Cough variant asthma: Secondary | ICD-10-CM | POA: Diagnosis not present

## 2017-11-08 DIAGNOSIS — R69 Illness, unspecified: Secondary | ICD-10-CM | POA: Diagnosis not present

## 2017-11-12 DIAGNOSIS — M1712 Unilateral primary osteoarthritis, left knee: Secondary | ICD-10-CM | POA: Diagnosis not present

## 2017-11-12 DIAGNOSIS — M25562 Pain in left knee: Secondary | ICD-10-CM | POA: Diagnosis not present

## 2017-12-15 DIAGNOSIS — I10 Essential (primary) hypertension: Secondary | ICD-10-CM | POA: Diagnosis not present

## 2017-12-15 DIAGNOSIS — E1169 Type 2 diabetes mellitus with other specified complication: Secondary | ICD-10-CM | POA: Diagnosis not present

## 2017-12-15 DIAGNOSIS — Z23 Encounter for immunization: Secondary | ICD-10-CM | POA: Diagnosis not present

## 2017-12-15 DIAGNOSIS — I679 Cerebrovascular disease, unspecified: Secondary | ICD-10-CM | POA: Diagnosis not present

## 2017-12-15 DIAGNOSIS — E7800 Pure hypercholesterolemia, unspecified: Secondary | ICD-10-CM | POA: Diagnosis not present

## 2017-12-22 DIAGNOSIS — R69 Illness, unspecified: Secondary | ICD-10-CM | POA: Diagnosis not present

## 2017-12-23 ENCOUNTER — Encounter (HOSPITAL_COMMUNITY): Payer: Self-pay

## 2017-12-23 ENCOUNTER — Other Ambulatory Visit: Payer: Self-pay

## 2017-12-23 ENCOUNTER — Emergency Department (HOSPITAL_COMMUNITY): Payer: Medicare HMO

## 2017-12-23 ENCOUNTER — Observation Stay (HOSPITAL_COMMUNITY)
Admission: EM | Admit: 2017-12-23 | Discharge: 2017-12-26 | Disposition: A | Discharge status: Home / Self Care | Payer: Medicare HMO | Attending: Internal Medicine | Admitting: Internal Medicine

## 2017-12-23 DIAGNOSIS — D72829 Elevated white blood cell count, unspecified: Secondary | ICD-10-CM | POA: Diagnosis not present

## 2017-12-23 DIAGNOSIS — R000 Tachycardia, unspecified: Secondary | ICD-10-CM | POA: Insufficient documentation

## 2017-12-23 DIAGNOSIS — Z7902 Long term (current) use of antithrombotics/antiplatelets: Secondary | ICD-10-CM | POA: Diagnosis not present

## 2017-12-23 DIAGNOSIS — G4700 Insomnia, unspecified: Secondary | ICD-10-CM | POA: Insufficient documentation

## 2017-12-23 DIAGNOSIS — K5731 Diverticulosis of large intestine without perforation or abscess with bleeding: Principal | ICD-10-CM | POA: Insufficient documentation

## 2017-12-23 DIAGNOSIS — K2950 Unspecified chronic gastritis without bleeding: Secondary | ICD-10-CM | POA: Insufficient documentation

## 2017-12-23 DIAGNOSIS — Z7984 Long term (current) use of oral hypoglycemic drugs: Secondary | ICD-10-CM | POA: Diagnosis not present

## 2017-12-23 DIAGNOSIS — I110 Hypertensive heart disease with heart failure: Secondary | ICD-10-CM | POA: Diagnosis not present

## 2017-12-23 DIAGNOSIS — Z881 Allergy status to other antibiotic agents status: Secondary | ICD-10-CM | POA: Insufficient documentation

## 2017-12-23 DIAGNOSIS — D62 Acute posthemorrhagic anemia: Secondary | ICD-10-CM | POA: Insufficient documentation

## 2017-12-23 DIAGNOSIS — Z7901 Long term (current) use of anticoagulants: Secondary | ICD-10-CM | POA: Diagnosis not present

## 2017-12-23 DIAGNOSIS — S0990XA Unspecified injury of head, initial encounter: Secondary | ICD-10-CM | POA: Insufficient documentation

## 2017-12-23 DIAGNOSIS — R1310 Dysphagia, unspecified: Secondary | ICD-10-CM | POA: Insufficient documentation

## 2017-12-23 DIAGNOSIS — E785 Hyperlipidemia, unspecified: Secondary | ICD-10-CM | POA: Insufficient documentation

## 2017-12-23 DIAGNOSIS — R51 Headache: Secondary | ICD-10-CM | POA: Diagnosis not present

## 2017-12-23 DIAGNOSIS — I10 Essential (primary) hypertension: Secondary | ICD-10-CM | POA: Diagnosis present

## 2017-12-23 DIAGNOSIS — I5042 Chronic combined systolic (congestive) and diastolic (congestive) heart failure: Secondary | ICD-10-CM | POA: Diagnosis not present

## 2017-12-23 DIAGNOSIS — Z8673 Personal history of transient ischemic attack (TIA), and cerebral infarction without residual deficits: Secondary | ICD-10-CM

## 2017-12-23 DIAGNOSIS — G44309 Post-traumatic headache, unspecified, not intractable: Secondary | ICD-10-CM | POA: Insufficient documentation

## 2017-12-23 DIAGNOSIS — K5790 Diverticulosis of intestine, part unspecified, without perforation or abscess without bleeding: Secondary | ICD-10-CM

## 2017-12-23 DIAGNOSIS — S199XXA Unspecified injury of neck, initial encounter: Secondary | ICD-10-CM | POA: Diagnosis not present

## 2017-12-23 DIAGNOSIS — K648 Other hemorrhoids: Secondary | ICD-10-CM | POA: Insufficient documentation

## 2017-12-23 DIAGNOSIS — Z9081 Acquired absence of spleen: Secondary | ICD-10-CM | POA: Insufficient documentation

## 2017-12-23 DIAGNOSIS — R0902 Hypoxemia: Secondary | ICD-10-CM | POA: Diagnosis not present

## 2017-12-23 DIAGNOSIS — E861 Hypovolemia: Secondary | ICD-10-CM | POA: Diagnosis not present

## 2017-12-23 DIAGNOSIS — R42 Dizziness and giddiness: Secondary | ICD-10-CM | POA: Diagnosis not present

## 2017-12-23 DIAGNOSIS — M5412 Radiculopathy, cervical region: Secondary | ICD-10-CM | POA: Insufficient documentation

## 2017-12-23 DIAGNOSIS — M1990 Unspecified osteoarthritis, unspecified site: Secondary | ICD-10-CM | POA: Insufficient documentation

## 2017-12-23 DIAGNOSIS — Z86010 Personal history of colonic polyps: Secondary | ICD-10-CM | POA: Insufficient documentation

## 2017-12-23 DIAGNOSIS — M4802 Spinal stenosis, cervical region: Secondary | ICD-10-CM | POA: Insufficient documentation

## 2017-12-23 DIAGNOSIS — K449 Diaphragmatic hernia without obstruction or gangrene: Secondary | ICD-10-CM | POA: Diagnosis not present

## 2017-12-23 DIAGNOSIS — K219 Gastro-esophageal reflux disease without esophagitis: Secondary | ICD-10-CM | POA: Insufficient documentation

## 2017-12-23 DIAGNOSIS — E876 Hypokalemia: Secondary | ICD-10-CM | POA: Diagnosis not present

## 2017-12-23 DIAGNOSIS — J45909 Unspecified asthma, uncomplicated: Secondary | ICD-10-CM | POA: Diagnosis not present

## 2017-12-23 DIAGNOSIS — I951 Orthostatic hypotension: Secondary | ICD-10-CM | POA: Diagnosis not present

## 2017-12-23 DIAGNOSIS — R58 Hemorrhage, not elsewhere classified: Secondary | ICD-10-CM | POA: Diagnosis not present

## 2017-12-23 DIAGNOSIS — I959 Hypotension, unspecified: Secondary | ICD-10-CM | POA: Diagnosis not present

## 2017-12-23 DIAGNOSIS — E119 Type 2 diabetes mellitus without complications: Secondary | ICD-10-CM | POA: Diagnosis not present

## 2017-12-23 DIAGNOSIS — K922 Gastrointestinal hemorrhage, unspecified: Secondary | ICD-10-CM | POA: Diagnosis not present

## 2017-12-23 DIAGNOSIS — W19XXXA Unspecified fall, initial encounter: Secondary | ICD-10-CM | POA: Diagnosis not present

## 2017-12-23 DIAGNOSIS — R569 Unspecified convulsions: Secondary | ICD-10-CM | POA: Insufficient documentation

## 2017-12-23 DIAGNOSIS — R5383 Other fatigue: Secondary | ICD-10-CM | POA: Diagnosis not present

## 2017-12-23 DIAGNOSIS — J449 Chronic obstructive pulmonary disease, unspecified: Secondary | ICD-10-CM | POA: Insufficient documentation

## 2017-12-23 DIAGNOSIS — I480 Paroxysmal atrial fibrillation: Secondary | ICD-10-CM

## 2017-12-23 DIAGNOSIS — Z79899 Other long term (current) drug therapy: Secondary | ICD-10-CM | POA: Insufficient documentation

## 2017-12-23 DIAGNOSIS — H8110 Benign paroxysmal vertigo, unspecified ear: Secondary | ICD-10-CM | POA: Insufficient documentation

## 2017-12-23 DIAGNOSIS — R55 Syncope and collapse: Secondary | ICD-10-CM | POA: Diagnosis not present

## 2017-12-23 HISTORY — DX: Chronic combined systolic (congestive) and diastolic (congestive) heart failure: I50.42

## 2017-12-23 LAB — CBC
HEMATOCRIT: 39.8 % (ref 39.0–52.0)
Hemoglobin: 12.6 g/dL — ABNORMAL LOW (ref 13.0–17.0)
MCH: 31.4 pg (ref 26.0–34.0)
MCHC: 31.7 g/dL (ref 30.0–36.0)
MCV: 99.3 fL (ref 80.0–100.0)
PLATELETS: 288 10*3/uL (ref 150–400)
RBC: 4.01 MIL/uL — ABNORMAL LOW (ref 4.22–5.81)
RDW: 14.3 % (ref 11.5–15.5)
WBC: 17.6 10*3/uL — ABNORMAL HIGH (ref 4.0–10.5)
nRBC: 0 % (ref 0.0–0.2)

## 2017-12-23 LAB — I-STAT TROPONIN, ED: Troponin i, poc: 0 ng/mL (ref 0.00–0.08)

## 2017-12-23 LAB — COMPREHENSIVE METABOLIC PANEL
ALBUMIN: 2.7 g/dL — AB (ref 3.5–5.0)
ALK PHOS: 44 U/L (ref 38–126)
ALT: 11 U/L (ref 0–44)
AST: 14 U/L — AB (ref 15–41)
Anion gap: 7 (ref 5–15)
BUN: 20 mg/dL (ref 8–23)
CALCIUM: 7.2 mg/dL — AB (ref 8.9–10.3)
CHLORIDE: 104 mmol/L (ref 98–111)
CO2: 23 mmol/L (ref 22–32)
CREATININE: 0.75 mg/dL (ref 0.61–1.24)
GFR calc non Af Amer: >60 mL/min (ref >60)
GLUCOSE: 138 mg/dL — AB (ref 70–99)
Potassium: 3.9 mmol/L (ref 3.5–5.1)
SODIUM: 134 mmol/L — AB (ref 135–145)
Total Bilirubin: 0.5 mg/dL (ref 0.3–1.2)
Total Protein: 5.1 g/dL — ABNORMAL LOW (ref 6.5–8.1)

## 2017-12-23 LAB — TYPE AND SCREEN
ABO/RH(D): O POS
Antibody Screen: NEGATIVE

## 2017-12-23 LAB — POC OCCULT BLOOD, ED: Fecal Occult Bld: POSITIVE — AB

## 2017-12-23 MED ORDER — ACETAMINOPHEN 650 MG RE SUPP: 650.0000 mg | Freq: Four times a day (QID) | RECTAL | Status: DC | PRN

## 2017-12-23 MED ORDER — INSULIN ASPART 100 UNIT/ML ~~LOC~~ SOLN
0.0000 [IU] | SUBCUTANEOUS | Status: DC
  Administered 2017-12-24: 1 [IU] via SUBCUTANEOUS
  Administered 2017-12-25: 2 [IU] via SUBCUTANEOUS

## 2017-12-23 MED ORDER — ACETAMINOPHEN 325 MG PO TABS: 650.0000 mg | ORAL_TABLET | Freq: Four times a day (QID) | ORAL | Status: DC | PRN

## 2017-12-23 MED ORDER — ALBUTEROL SULFATE (2.5 MG/3ML) 0.083% IN NEBU: 2.5000 mg | INHALATION_SOLUTION | Freq: Four times a day (QID) | RESPIRATORY_TRACT | Status: DC | PRN

## 2017-12-23 MED ORDER — ONDANSETRON HCL 4 MG/2ML IJ SOLN: 4.0000 mg | Freq: Four times a day (QID) | INTRAMUSCULAR | Status: DC | PRN

## 2017-12-23 MED ORDER — SODIUM CHLORIDE 0.9 % IV BOLUS
500.0000 mL | Freq: Once | INTRAVENOUS | Status: AC
  Administered 2017-12-24: 500 mL via INTRAVENOUS

## 2017-12-23 MED ORDER — ONDANSETRON HCL 4 MG PO TABS: 4.0000 mg | ORAL_TABLET | Freq: Four times a day (QID) | ORAL | Status: DC | PRN

## 2017-12-23 MED ORDER — SODIUM CHLORIDE 0.9% FLUSH
3.0000 mL | Freq: Two times a day (BID) | INTRAVENOUS | Status: DC
  Administered 2017-12-24 – 2017-12-25 (×3): 3 mL via INTRAVENOUS

## 2017-12-23 MED ORDER — MOMETASONE FURO-FORMOTEROL FUM 100-5 MCG/ACT IN AERO
1.0000 | INHALATION_SPRAY | Freq: Two times a day (BID) | RESPIRATORY_TRACT | Status: DC
  Administered 2017-12-24 – 2017-12-26 (×5): 1 via RESPIRATORY_TRACT
  Filled 2017-12-23: qty 8.8

## 2017-12-23 MED ORDER — SODIUM CHLORIDE 0.9 % IV SOLN
INTRAVENOUS | Status: AC
  Administered 2017-12-24 (×2): via INTRAVENOUS

## 2017-12-23 NOTE — ED Notes (Signed)
Pt unable to tolerate orthostatic vital signs due to lightheadedness.

## 2017-12-23 NOTE — H&P (Signed)
History and Physical    KJ IMBERT CHY:[id number] DOB: [birth year] DOA: 12/23/2017  PCP: Seward Carol, MD   Patient coming from: Home   Chief Complaint: Rectal bleeding, lightheadedness, syncope   HPI: [name] is a 84 y.o. male with medical history significant for type 2 diabetes mellitus, chronic combined systolic and diastolic CHF, history of CVA, recurrent GI bleed, and paroxysmal atrial fibrillation not anticoagulated due to recurrent GI bleeding, now presenting to the emergency department with rectal bleeding and syncope.  Patient reports that he has had some mild abdominal bloating and gas for the past several days since increasing his metformin from 500 to 1000 mg.  He had otherwise been in his usual state of health until today when he noted some bright red blood when moving his bowels.  He is gone on to have additional bright red and maroon blood per rectum.  There is no abdominal pain associated with this and no nausea or vomiting.  He was seated this evening, stood up to help his wife with something in the kitchen, became acutely lightheaded, noted to be pale at this time by his wife, and he suffered a syncopal episode, falling backwards, eased to the ground by his wife, but did hit his head on the floor.  He quickly regained consciousness but continues to have lightheadedness and presyncope if he sits up or attempts to stand.  He denies any associated chest pain or palpitations.  ED Course: Upon arrival to the ED, patient is found to be afebrile, saturating well on room air, tachycardic in the 110s, and with stable blood pressure.  EKG features a sinus rhythm with PVCs, noncontrast head CT is negative for acute intracranial abnormality, and cervical spine CT is negative for acute abnormality.  Chemistry panel is notable for slight hyponatremia and CBC features a leukocytosis of 17,600 and a normocytic anemia with hemoglobin 12.6, down from 16.8 in September.  Type and screen was  performed and gastroenterology was consulted by the ED physician, recommending that patient be kept n.p.o. and admitted to the hospitalist service.  Review of Systems:  All other systems reviewed and apart from HPI, are negative.  Past Medical History:  Diagnosis Date  . Acute CVA (cerebrovascular accident) ([place]) 07/25/2015  . Asthma   . Benign essential HTN   . Cerebral infarction due to embolism of left middle cerebral artery ([place]) 02/03/2014  . Chronic combined systolic and diastolic CHF (congestive heart failure) ([place]) 12/23/2017  . Colitis   . Colon polyps   . Diabetes mellitus type 2, diet-controlled ([place]) 12/22/2013  . Diverticulosis   . GERD (gastroesophageal reflux disease)   . GI bleed   . Hemorrhoids   . Hiatal hernia   . History of esophageal strciture   . HLD (hyperlipidemia)   . Osteoarthritis   . Proctitis   . Status post dilation of esophageal narrowing   . Stroke [facility])     Past Surgical History:  Procedure Laterality Date  . ABDOMINAL HERNIA REPAIR    . COLONOSCOPY     multiple  . COLONOSCOPY WITH PROPOFOL N/A 12/12/2016   Procedure: COLONOSCOPY WITH PROPOFOL;  Surgeon: Gatha Mayer, MD;  Location: [facility] ENDOSCOPY;  Service: Endoscopy;  Laterality: N/A;  . ESOPHAGOGASTRODUODENOSCOPY     multiple  . INSERTION OF MESH  01/29/2012   Procedure: INSERTION OF MESH;  Surgeon: Gwenyth Ober, MD;  Location: [place];  Service: General;  Laterality: N/A;  . NISSEN FUNDOPLICATION    .  SPLENECTOMY, TOTAL     nontraumatic rupture  . VENTRAL HERNIA REPAIR  01/29/2012    WITH MESH  . VENTRAL HERNIA REPAIR  01/29/2012   Procedure: HERNIA REPAIR VENTRAL ADULT;  Surgeon: Gwenyth Ober, MD;  Location: [place];  Service: General;  Laterality: N/A;  open recurrent ventral hernia repair with mesh     reports that he has never smoked. He has never used smokeless tobacco. He reports that he drinks about 1.0 standard drinks of alcohol per week. He reports that he does not use  drugs.  Allergies  Allergen Reactions  . Azithromycin Rash and Other (See Comments)    Possible reaction to Z-Pak    Family History  Problem Relation Age of Onset  . Cancer Father      Prior to Admission medications   Medication Sig Start Date End Date Taking? Authorizing Provider  acetaminophen (TYLENOL) 500 MG tablet Take 1,000 mg by mouth every 6 (six) hours as needed for headache (pain).   Yes [provider]  albuterol (PROAIR HFA) 108 (90 Base) MCG/ACT inhaler Inhale 2 puffs into the lungs every 6 (six) hours as needed for wheezing or shortness of breath.   Yes [provider]  baclofen (LIORESAL) 10 MG tablet Take 0.5 tablets (5 mg total) by mouth at bedtime as needed for muscle spasms. Patient taking differently: Take 10 mg by mouth at bedtime as needed for muscle spasms.  10/25/16  Yes Debbe Odea, MD  carvedilol (COREG) 3.125 MG tablet Take 1 tablet (3.125 mg total) by mouth 2 (two) times daily with a meal. 02/26/16  Yes Amin, Ankit Chirag, MD  cetirizine (ZYRTEC) 10 MG tablet Take 10 mg by mouth at bedtime.    Yes [provider]  clopidogrel (PLAVIX) 75 MG tablet Take 1 tablet (75 mg total) by mouth daily. 11/25/16  Yes Garvin Fila, MD  fluticasone (FLONASE) 50 MCG/ACT nasal spray Place 1 spray into both nostrils every morning.  06/18/14  Yes [provider]  guaiFENesin (MUCINEX) 600 MG 12 hr tablet Take 600 mg by mouth daily as needed for cough or to loosen phlegm.    Yes [provider]  Melatonin 1 MG TABS Take 5 mg by mouth at bedtime as needed (sleep).    Yes [provider]  metFORMIN (GLUCOPHAGE) 500 MG tablet Take 1,000 mg by mouth daily with breakfast.   Yes [provider]  mometasone-formoterol (DULERA) 100-5 MCG/ACT AERO Inhale 2 puffs into the lungs 2 (two) times daily. Patient taking differently: Inhale 1 puff into the lungs 2 (two) times daily.  12/12/15  Yes Tanda Rockers, MD  montelukast  (SINGULAIR) 10 MG tablet Take 1 tablet (10 mg total) by mouth at bedtime. 10/05/17  Yes Tanda Rockers, MD  psyllium (METAMUCIL) 58.6 % powder Take 1 packet by mouth every morning.    Yes [provider]  rOPINIRole (REQUIP) 0.25 MG tablet Take 1 tablet (0.25 mg total) by mouth at bedtime. Patient taking differently: Take 0.5 mg by mouth at bedtime.  02/26/16  Yes Amin, Jeanella Flattery, MD  simvastatin (ZOCOR) 40 MG tablet Take 1 tablet (40 mg total) by mouth daily. Patient taking differently: Take 40 mg by mouth at bedtime.  02/26/16  Yes Amin, Ankit Chirag, MD  zolpidem (AMBIEN) 10 MG tablet Take 5-10 mg by mouth at bedtime as needed for sleep.    Yes [provider]    Physical Exam: Vitals:   12/23/17 2045 12/23/17 2115  12/23/17 2145 12/23/17 2230  BP: 125/88 108/66 (!) 135/98 134/87  Pulse: 92 99 (!) 106 (!) 111  Resp: 17 (!) 26 (!) 22 (!) 25  Temp:      TempSrc:      SpO2: 97% 98% 100% 97%  Weight:      Height:        Constitutional: NAD, calm  Eyes: PERTLA, lids and conjunctivae normal ENMT: Mucous membranes are moist. Posterior pharynx clear of any exudate or lesions.   Neck: normal, supple, no masses, no thyromegaly Respiratory: clear to auscultation bilaterally, no wheezing, no crackles. Normal respiratory effort.    Cardiovascular: Rate ~110 and regular. No extremity edema.   Abdomen: No distension, no tenderness, soft. Bowel sounds normal.  Musculoskeletal: no clubbing / cyanosis. No joint deformity upper and lower extremities.    Skin: no significant rashes, lesions, ulcers. Poor turgor. Neurologic: No facial asymmetry. Sensation intact. Moving all extremities.  Psychiatric: Alert and oriented x 3. Very pleasant, cooperative.    Labs on Admission: I have personally reviewed following labs and imaging studies  CBC: Recent Labs  Lab 12/23/17 2050  WBC 17.6*  HGB 12.6*  HCT 39.8  MCV 99.3  PLT 161   Basic Metabolic Panel: Recent Labs  Lab  12/23/17 2050  NA 134*  K 3.9  CL 104  CO2 23  GLUCOSE 138*  BUN 20  CREATININE 0.75  CALCIUM 7.2*   GFR: Estimated Creatinine Clearance: 70.6 mL/min (by C-G formula based on SCr of 0.75 mg/dL). Liver Function Tests: Recent Labs  Lab 12/23/17 2050  AST 14*  ALT 11  ALKPHOS 44  BILITOT 0.5  PROT 5.1*  ALBUMIN 2.7*   No results for input(s): LIPASE, AMYLASE in the last 168 hours. No results for input(s): AMMONIA in the last 168 hours. Coagulation Profile: No results for input(s): INR, PROTIME in the last 168 hours. Cardiac Enzymes: No results for input(s): CKTOTAL, CKMB, CKMBINDEX, TROPONINI in the last 168 hours. BNP (last 3 results) No results for input(s): PROBNP in the last 8760 hours. HbA1C: No results for input(s): HGBA1C in the last 72 hours. CBG: No results for input(s): GLUCAP in the last 168 hours. Lipid Profile: No results for input(s): CHOL, HDL, LDLCALC, TRIG, CHOLHDL, LDLDIRECT in the last 72 hours. Thyroid Function Tests: No results for input(s): TSH, T4TOTAL, FREET4, T3FREE, THYROIDAB in the last 72 hours. Anemia Panel: No results for input(s): VITAMINB12, FOLATE, FERRITIN, TIBC, IRON, RETICCTPCT in the last 72 hours. Urine analysis:    Component Value Date/Time   COLORURINE YELLOW 05/04/2016 1123   APPEARANCEUR CLEAR 05/04/2016 1123   LABSPEC 1.011 05/04/2016 1123   PHURINE 7.0 05/04/2016 1123   GLUCOSEU NEGATIVE 05/04/2016 1123   HGBUR NEGATIVE 05/04/2016 1123   BILIRUBINUR NEGATIVE 05/04/2016 1123   KETONESUR NEGATIVE 05/04/2016 1123   PROTEINUR NEGATIVE 05/04/2016 1123   UROBILINOGEN 0.2 12/22/2013 1210   NITRITE NEGATIVE 05/04/2016 1123   LEUKOCYTESUR NEGATIVE 05/04/2016 1123   Sepsis Labs: @LABRCNTIP (procalcitonin:4,lacticidven:4) )No results found for this or any previous visit (from the past 240 hour(s)).   Radiological Exams on Admission: Ct Head Wo Contrast  Result Date: 12/23/2017 CLINICAL DATA:  Posttraumatic headache.  Syncope. Fall with head trauma. EXAM: CT HEAD WITHOUT CONTRAST CT CERVICAL SPINE WITHOUT CONTRAST TECHNIQUE: Multidetector CT imaging of the head and cervical spine was performed following the standard protocol without intravenous contrast. Multiplanar CT image reconstructions of the cervical spine were also generated. COMPARISON:  Head CT 10/24/2016 FINDINGS: CT HEAD FINDINGS Brain:  There is no mass, hemorrhage or extra-axial collection. There is generalized atrophy without lobar predilection. Areas of hypoattenuation of the deep gray nuclei and confluent periventricular white matter hypodensity, consistent with chronic small vessel disease. Vascular: Atherosclerotic calcification of the internal carotid arteries at the skull base. No abnormal hyperdensity of the major intracranial arteries or dural venous sinuses. Skull: The visualized skull base, calvarium and extracranial soft tissues are normal. Sinuses/Orbits: Diffuse moderate paranasal sinus opacification. Findings of prior bilateral maxillary antrostomies. The orbits are normal. CT CERVICAL SPINE FINDINGS Alignment: Grade 1 anterolisthesis at C4-5 due to facet hypertrophy. Alignment is otherwise normal. Skull base and vertebrae: No acute fracture. Soft tissues and spinal canal: No prevertebral fluid or swelling. No visible canal hematoma. Disc levels: Multilevel facet hypertrophy with neural foraminal stenosis that is worst at right C3-4, right C4-5. Upper chest: No pneumothorax, pulmonary nodule or pleural effusion. Other: Normal visualized paraspinal cervical soft tissues. IMPRESSION: 1. Generalized atrophy and chronic small vessel ischemia without acute intracranial abnormality. 2. No acute abnormality of the cervical spine. 3. Multilevel facet arthrosis contributing to grade 1 C4-5 anterolisthesis and severe foraminal stenosis on the right at C3-4 and C4-5. Electronically Signed   By: Ulyses Jarred M.D.   On: 12/23/2017 23:14   Ct Cervical Spine Wo  Contrast  Result Date: 12/23/2017 CLINICAL DATA:  Posttraumatic headache. Syncope. Fall with head trauma. EXAM: CT HEAD WITHOUT CONTRAST CT CERVICAL SPINE WITHOUT CONTRAST TECHNIQUE: Multidetector CT imaging of the head and cervical spine was performed following the standard protocol without intravenous contrast. Multiplanar CT image reconstructions of the cervical spine were also generated. COMPARISON:  Head CT 10/24/2016 FINDINGS: CT HEAD FINDINGS Brain: There is no mass, hemorrhage or extra-axial collection. There is generalized atrophy without lobar predilection. Areas of hypoattenuation of the deep gray nuclei and confluent periventricular white matter hypodensity, consistent with chronic small vessel disease. Vascular: Atherosclerotic calcification of the internal carotid arteries at the skull base. No abnormal hyperdensity of the major intracranial arteries or dural venous sinuses. Skull: The visualized skull base, calvarium and extracranial soft tissues are normal. Sinuses/Orbits: Diffuse moderate paranasal sinus opacification. Findings of prior bilateral maxillary antrostomies. The orbits are normal. CT CERVICAL SPINE FINDINGS Alignment: Grade 1 anterolisthesis at C4-5 due to facet hypertrophy. Alignment is otherwise normal. Skull base and vertebrae: No acute fracture. Soft tissues and spinal canal: No prevertebral fluid or swelling. No visible canal hematoma. Disc levels: Multilevel facet hypertrophy with neural foraminal stenosis that is worst at right C3-4, right C4-5. Upper chest: No pneumothorax, pulmonary nodule or pleural effusion. Other: Normal visualized paraspinal cervical soft tissues. IMPRESSION: 1. Generalized atrophy and chronic small vessel ischemia without acute intracranial abnormality. 2. No acute abnormality of the cervical spine. 3. Multilevel facet arthrosis contributing to grade 1 C4-5 anterolisthesis and severe foraminal stenosis on the right at C3-4 and C4-5. Electronically Signed    By: Ulyses Jarred M.D.   On: 12/23/2017 23:14    EKG: Independently reviewed. Sinus rhythm, PVC's.   Assessment/Plan  1. Acute GI bleed  - Patient has hx of recurrent GIB's and presents with 1 day of bright red rectal bleeding and hematochezia with lightheadedness and syncope on standing  - No abdominal pain, indigestion, or N/V  - Likely lower GIB, probably recurrent diverticular bleed  - Hgb is 12.6 on admission, down from 16.8 in September  - He is tachycardic in 110's with stable BP  - GI is consulting and much appreciated  - Type and screen done   -  Keep NPO, start IVF hydration, trend H&H   2. Syncope  - Became acutely lightheaded upon standing and suffered transient LOC, quickly recovering  - No chest discomfort or palpitations associated with this, sinus rhythm on EKG, could not tolerate orthostatic vitals in ED d/t severe lightheadedness with sitting up   - Hydrate with NS while mindful of CHF history, continue cardiac monitoring, trend H&H and transfuse as-needed    3. Type II DM  - A1c was 6.2% in January 2018  - Managed with metformin at home, held on admission  - Check CBG's and use a low-intensity SSI with Novolog as needed while in hospital    4. History of CVA  - Denies any focal deficits  - Plavix held in light of acute GIB, statin held while NPO    5. Chronic combined systolic and diastolic CHF  - Appears hypovolemic on admission, has mild tachycardia and lightheadedness on standing  - Cautiously hydrate, follow daily wt and I/O's   6. Paroxysmal atrial fibrillation  - In sinus rhythm on admission  - CHADS-VASc is at least 9 (age x2, CVA x2, DM, CHF)  - Not anticoagulated d/t recurrent GIB  - Plavix held for acute bleed    7. Leukocytosis  - WBC is 17,600 on admission without fever  - Appears to be some chronic leukocytosis, but higher now  - Most likely reactive, will culture if febrile    DVT prophylaxis: SCD's  Code Status: DNR Family  Communication: Wife updated at bedside  Consults called: GI  Admission status: Observation     Vianne Bulls, MD [facility] Pager [phone]  If 7PM-7AM, please contact night-coverage [email or web address] Password  [facility]  12/23/2017, 11:53 PM

## 2017-12-23 NOTE — ED Triage Notes (Signed)
Pt BIB [facility] for eval of syncope. Pt was standing, fell backwards and struck his head. Wife reports he appeared as though he was going to lose consciousness, she attempted to catch him but was unable to do so. Currently on plavix. Family reports hx of GI bleed, which she states began this afternoon. Wife reports that pt had formed dark stools w/ frank red blood as well.

## 2017-12-23 NOTE — ED Provider Notes (Signed)
[place] EMERGENCY DEPARTMENT Provider Note   CSN: [id number] Arrival date & time: 12/23/17  2023     History   Chief Complaint Chief Complaint  Patient presents with  . Loss of Consciousness    HPI [name] is a 84 y.o. male.  HPI   84 year old male with past medical history of GI bleeds, hypertension, previous CVA on Plavix here with GI bleed.  The patient states that earlier today, he began passing bright red blood per rectum.  This is a an ongoing, recurrent issue for him.  He states he has felt progressively more weak throughout the day.  Earlier this afternoon, the patient stood up to go to the other room.  He states that he felt lightheaded and weak while doing so.  He then reportedly became acutely lightheaded, pale per his wife's report, and syncopized.  She was able to briefly catch him, though he did hit his head on the way down.  He reports extreme, severe, fatigue and lightheadedness with any kind of movement or sitting up.  He does not feel he can move.  Denies any associated abdominal pain.  Continue taking his medications including his Plavix.  Past Medical History:  Diagnosis Date  . Acute CVA (cerebrovascular accident) ([place]) 07/25/2015  . Asthma   . Benign essential HTN   . Cerebral infarction due to embolism of left middle cerebral artery ([place]) 02/03/2014  . Chronic combined systolic and diastolic CHF (congestive heart failure) ([place]) 12/23/2017  . Colitis   . Colon polyps   . Diabetes mellitus type 2, diet-controlled ([place]) 12/22/2013  . Diverticulosis   . GERD (gastroesophageal reflux disease)   . GI bleed   . Hemorrhoids   . Hiatal hernia   . History of esophageal strciture   . HLD (hyperlipidemia)   . Osteoarthritis   . Proctitis   . Status post dilation of esophageal narrowing   . Stroke [facility])     Patient Active Problem List   Diagnosis Date Noted  . Syncope due to orthostatic hypotension 12/23/2017  . Chronic combined  systolic and diastolic CHF (congestive heart failure) ([place]) 12/23/2017  . GI bleed 10/25/2016  . BPPV (benign paroxysmal positional vertigo) 10/25/2016  . Lower GI bleed 10/24/2016  . Syncope 10/24/2016  . TIA (transient ischemic attack) 07/22/2016  . Benign essential HTN   . PAF (paroxysmal atrial fibrillation) ([place])   . Stroke-like symptom 02/22/2016  . Leukocytosis 02/22/2016  . Acute lower GI bleeding 02/01/2016  . History of lower GI bleeding Dec 2017 02/01/2016  . Chronic anticoagulation 2/2 history of CVA 01/31/2016  . History of CVA (cerebrovascular accident) 01/31/2016  . Sinusitis, chronic 12/12/2015  . Cough variant asthma 10/31/2015  . Insomnia 07/25/2015  . HLD (hyperlipidemia) 02/03/2014  . Ulnar neuropathy at elbow of right upper extremity 02/03/2014  . Cervical radiculopathy 02/03/2014  . Seizures ([place])   . Diabetes mellitus type 2, diet-controlled ([place]) 12/22/2013  . Recurrent ventral hernia 12/15/2011  . History of esophageal strciture   . Diverticulosis of colon with hemorrhage 06/24/2007    Past Surgical History:  Procedure Laterality Date  . ABDOMINAL HERNIA REPAIR    . COLONOSCOPY     multiple  . COLONOSCOPY WITH PROPOFOL N/A 12/12/2016   Procedure: COLONOSCOPY WITH PROPOFOL;  Surgeon: Gatha Mayer, MD;  Location[facility] ENDOSCOPY;  Service: Endoscopy;  Laterality: N/A;  . ESOPHAGOGASTRODUODENOSCOPY     multiple  . INSERTION OF MESH  01/29/2012   Procedure: INSERTION  OF MESH;  Surgeon: Gwenyth Ober, MD;  Location: [place];  Service: General;  Laterality: N/A;  . NISSEN FUNDOPLICATION    . SPLENECTOMY, TOTAL     nontraumatic rupture  . VENTRAL HERNIA REPAIR  01/29/2012    WITH MESH  . VENTRAL HERNIA REPAIR  01/29/2012   Procedure: HERNIA REPAIR VENTRAL ADULT;  Surgeon: Gwenyth Ober, MD;  Location: [place];  Service: General;  Laterality: N/A;  open recurrent ventral hernia repair with mesh        Home Medications    Prior to Admission medications    Medication Sig Start Date End Date Taking? Authorizing Provider  acetaminophen (TYLENOL) 500 MG tablet Take 1,000 mg by mouth every 6 (six) hours as needed for headache (pain).   Yes [provider]  albuterol (PROAIR HFA) 108 (90 Base) MCG/ACT inhaler Inhale 2 puffs into the lungs every 6 (six) hours as needed for wheezing or shortness of breath.   Yes [provider]  baclofen (LIORESAL) 10 MG tablet Take 0.5 tablets (5 mg total) by mouth at bedtime as needed for muscle spasms. Patient taking differently: Take 10 mg by mouth at bedtime as needed for muscle spasms.  10/25/16  Yes Debbe Odea, MD  carvedilol (COREG) 3.125 MG tablet Take 1 tablet (3.125 mg total) by mouth 2 (two) times daily with a meal. 02/26/16  Yes Amin, Ankit Chirag, MD  cetirizine (ZYRTEC) 10 MG tablet Take 10 mg by mouth at bedtime.    Yes [provider]  clopidogrel (PLAVIX) 75 MG tablet Take 1 tablet (75 mg total) by mouth daily. 11/25/16  Yes Garvin Fila, MD  fluticasone (FLONASE) 50 MCG/ACT nasal spray Place 1 spray into both nostrils every morning.  06/18/14  Yes [provider]  guaiFENesin (MUCINEX) 600 MG 12 hr tablet Take 600 mg by mouth daily as needed for cough or to loosen phlegm.    Yes [provider]  Melatonin 1 MG TABS Take 5 mg by mouth at bedtime as needed (sleep).    Yes [provider]  metFORMIN (GLUCOPHAGE) 500 MG tablet Take 1,000 mg by mouth daily with breakfast.   Yes [provider]  mometasone-formoterol (DULERA) 100-5 MCG/ACT AERO Inhale 2 puffs into the lungs 2 (two) times daily. Patient taking differently: Inhale 1 puff into the lungs 2 (two) times daily.  12/12/15  Yes Tanda Rockers, MD  montelukast (SINGULAIR) 10 MG tablet Take 1 tablet (10 mg total) by mouth at bedtime. 10/05/17  Yes Tanda Rockers, MD  psyllium (METAMUCIL) 58.6 % powder Take 1 packet by mouth every morning.    Yes [provider]  rOPINIRole (REQUIP)  0.25 MG tablet Take 1 tablet (0.25 mg total) by mouth at bedtime. Patient taking differently: Take 0.5 mg by mouth at bedtime.  02/26/16  Yes Amin, Jeanella Flattery, MD  simvastatin (ZOCOR) 40 MG tablet Take 1 tablet (40 mg total) by mouth daily. Patient taking differently: Take 40 mg by mouth at bedtime.  02/26/16  Yes Amin, Ankit Chirag, MD  zolpidem (AMBIEN) 10 MG tablet Take 5-10 mg by mouth at bedtime as needed for sleep.    Yes [provider]    Family History Family History  Problem Relation Age of Onset  . Cancer Father     Social History Social History   Tobacco Use  . Smoking status: Never Smoker  . Smokeless tobacco: Never Used  Substance Use Topics  . Alcohol use: Yes  Alcohol/week: 1.0 standard drinks    Types: 1 Glasses of wine per week    Comment: daily  . Drug use: No     Allergies   Azithromycin   Review of Systems Review of Systems  Constitutional: Positive for fatigue. Negative for chills and fever.  HENT: Negative for congestion and rhinorrhea.   Eyes: Negative for visual disturbance.  Respiratory: Negative for cough, shortness of breath and wheezing.   Cardiovascular: Negative for chest pain and leg swelling.  Gastrointestinal: Positive for blood in stool. Negative for abdominal pain, diarrhea, nausea and vomiting.  Genitourinary: Negative for dysuria and flank pain.  Musculoskeletal: Negative for neck pain and neck stiffness.  Skin: Negative for rash and wound.  Allergic/Immunologic: Negative for immunocompromised state.  Neurological: Positive for syncope and weakness. Negative for headaches.  All other systems reviewed and are negative.    Physical Exam Updated Vital Signs BP 107/66 (BP Location: Right Arm)   Pulse 88   Temp 98.4 F (36.9 C) (Oral)   Resp 18   Ht 5\' 10"  (1.778 m)   Wt 72.6 kg   SpO2 97%   BMI 22.96 kg/m   Physical Exam  Constitutional: He is oriented to person, place, and time. He appears well-developed and  well-nourished. No distress.  HENT:  Head: Normocephalic and atraumatic.  Eyes: Conjunctivae are normal.  Neck: Neck supple.  Cardiovascular: Normal heart sounds. Tachycardia present. Exam reveals no friction rub.  No murmur heard. Pulmonary/Chest: Effort normal and breath sounds normal. No respiratory distress. He has no wheezes. He has no rales.  Abdominal: He exhibits no distension.  Genitourinary:  Genitourinary Comments: Gross blood per rectum  Musculoskeletal: He exhibits no edema.  Neurological: He is alert and oriented to person, place, and time. He exhibits normal muscle tone.  Skin: Skin is warm. Capillary refill takes less than 2 seconds.  Psychiatric: He has a normal mood and affect.  Nursing note and vitals reviewed.    ED Treatments / Results  Labs (all labs ordered are listed, but only abnormal results are displayed) Labs Reviewed  COMPREHENSIVE METABOLIC PANEL - Abnormal; Notable for the following components:      Result Value   Sodium 134 (*)    Glucose, Bld 138 (*)    Calcium 7.2 (*)    Total Protein 5.1 (*)    Albumin 2.7 (*)    AST 14 (*)    All other components within normal limits  CBC - Abnormal; Notable for the following components:   WBC 17.6 (*)    RBC 4.01 (*)    Hemoglobin 12.6 (*)    All other components within normal limits  BASIC METABOLIC PANEL - Abnormal; Notable for the following components:   Glucose, Bld 153 (*)    BUN 25 (*)    Calcium 8.0 (*)    All other components within normal limits  HEMOGLOBIN - Abnormal; Notable for the following components:   Hemoglobin 11.5 (*)    All other components within normal limits  HEMOGLOBIN - Abnormal; Notable for the following components:   Hemoglobin 10.5 (*)    All other components within normal limits  HEMATOCRIT - Abnormal; Notable for the following components:   HCT 37.0 (*)    All other components within normal limits  HEMATOCRIT - Abnormal; Notable for the following components:   HCT  34.2 (*)    All other components within normal limits  GLUCOSE, CAPILLARY - Abnormal; Notable for the following components:   Glucose-Capillary  129 (*)    All other components within normal limits  GLUCOSE, CAPILLARY - Abnormal; Notable for the following components:   Glucose-Capillary 126 (*)    All other components within normal limits  GLUCOSE, CAPILLARY - Abnormal; Notable for the following components:   Glucose-Capillary 112 (*)    All other components within normal limits  GLUCOSE, CAPILLARY - Abnormal; Notable for the following components:   Glucose-Capillary 100 (*)    All other components within normal limits  POC OCCULT BLOOD, ED - Abnormal; Notable for the following components:   Fecal Occult Bld POSITIVE (*)    All other components within normal limits  HEMOGLOBIN AND HEMATOCRIT, BLOOD  I-STAT TROPONIN, ED  TYPE AND SCREEN    EKG EKG Interpretation  Date/Time:  Thursday December 23 2017 20:24:06 EST Ventricular Rate:  95 PR Interval:    QRS Duration: 98 QT Interval:  367 QTC Calculation: 462 R Axis:   -80 Text Interpretation:  Sinus rhythm Multiple ventricular premature complexes Probable left atrial enlargement Borderline low voltage, extremity leads No significant change since last tracing Confirmed by Duffy Bruce [phone]) on 12/23/2017 9:03:43 PM   Radiology Ct Head Wo Contrast  Result Date: 12/23/2017 CLINICAL DATA:  Posttraumatic headache. Syncope. Fall with head trauma. EXAM: CT HEAD WITHOUT CONTRAST CT CERVICAL SPINE WITHOUT CONTRAST TECHNIQUE: Multidetector CT imaging of the head and cervical spine was performed following the standard protocol without intravenous contrast. Multiplanar CT image reconstructions of the cervical spine were also generated. COMPARISON:  Head CT 10/24/2016 FINDINGS: CT HEAD FINDINGS Brain: There is no mass, hemorrhage or extra-axial collection. There is generalized atrophy without lobar predilection. Areas of hypoattenuation of  the deep gray nuclei and confluent periventricular white matter hypodensity, consistent with chronic small vessel disease. Vascular: Atherosclerotic calcification of the internal carotid arteries at the skull base. No abnormal hyperdensity of the major intracranial arteries or dural venous sinuses. Skull: The visualized skull base, calvarium and extracranial soft tissues are normal. Sinuses/Orbits: Diffuse moderate paranasal sinus opacification. Findings of prior bilateral maxillary antrostomies. The orbits are normal. CT CERVICAL SPINE FINDINGS Alignment: Grade 1 anterolisthesis at C4-5 due to facet hypertrophy. Alignment is otherwise normal. Skull base and vertebrae: No acute fracture. Soft tissues and spinal canal: No prevertebral fluid or swelling. No visible canal hematoma. Disc levels: Multilevel facet hypertrophy with neural foraminal stenosis that is worst at right C3-4, right C4-5. Upper chest: No pneumothorax, pulmonary nodule or pleural effusion. Other: Normal visualized paraspinal cervical soft tissues. IMPRESSION: 1. Generalized atrophy and chronic small vessel ischemia without acute intracranial abnormality. 2. No acute abnormality of the cervical spine. 3. Multilevel facet arthrosis contributing to grade 1 C4-5 anterolisthesis and severe foraminal stenosis on the right at C3-4 and C4-5. Electronically Signed   By: Ulyses Jarred M.D.   On: 12/23/2017 23:14   Ct Cervical Spine Wo Contrast  Result Date: 12/23/2017 CLINICAL DATA:  Posttraumatic headache. Syncope. Fall with head trauma. EXAM: CT HEAD WITHOUT CONTRAST CT CERVICAL SPINE WITHOUT CONTRAST TECHNIQUE: Multidetector CT imaging of the head and cervical spine was performed following the standard protocol without intravenous contrast. Multiplanar CT image reconstructions of the cervical spine were also generated. COMPARISON:  Head CT 10/24/2016 FINDINGS: CT HEAD FINDINGS Brain: There is no mass, hemorrhage or extra-axial collection. There is  generalized atrophy without lobar predilection. Areas of hypoattenuation of the deep gray nuclei and confluent periventricular white matter hypodensity, consistent with chronic small vessel disease. Vascular: Atherosclerotic calcification of the internal carotid arteries at the  skull base. No abnormal hyperdensity of the major intracranial arteries or dural venous sinuses. Skull: The visualized skull base, calvarium and extracranial soft tissues are normal. Sinuses/Orbits: Diffuse moderate paranasal sinus opacification. Findings of prior bilateral maxillary antrostomies. The orbits are normal. CT CERVICAL SPINE FINDINGS Alignment: Grade 1 anterolisthesis at C4-5 due to facet hypertrophy. Alignment is otherwise normal. Skull base and vertebrae: No acute fracture. Soft tissues and spinal canal: No prevertebral fluid or swelling. No visible canal hematoma. Disc levels: Multilevel facet hypertrophy with neural foraminal stenosis that is worst at right C3-4, right C4-5. Upper chest: No pneumothorax, pulmonary nodule or pleural effusion. Other: Normal visualized paraspinal cervical soft tissues. IMPRESSION: 1. Generalized atrophy and chronic small vessel ischemia without acute intracranial abnormality. 2. No acute abnormality of the cervical spine. 3. Multilevel facet arthrosis contributing to grade 1 C4-5 anterolisthesis and severe foraminal stenosis on the right at C3-4 and C4-5. Electronically Signed   By: Ulyses Jarred M.D.   On: 12/23/2017 23:14    Procedures Procedures (including critical care time)  Medications Ordered in ED Medications  albuterol (PROVENTIL) (2.5 MG/3ML) 0.083% nebulizer solution 2.5 mg (has no administration in time range)  mometasone-formoterol (DULERA) 100-5 MCG/ACT inhaler 1 puff (1 puff Inhalation Given 12/24/17 0900)  insulin aspart (novoLOG) injection 0-9 Units (0 Units Subcutaneous Not Given 12/24/17 0841)  sodium chloride flush (NS) 0.9 % injection 3 mL (3 mLs Intravenous Not  Given 12/24/17 1017)  0.9 %  sodium chloride infusion ( Intravenous New Bag/Given 12/24/17 0522)  acetaminophen (TYLENOL) tablet 650 mg (has no administration in time range)    Or  acetaminophen (TYLENOL) suppository 650 mg (has no administration in time range)  ondansetron (ZOFRAN) tablet 4 mg (has no administration in time range)    Or  ondansetron (ZOFRAN) injection 4 mg (has no administration in time range)  LORazepam (ATIVAN) injection 0.5 mg (0.5 mg Intravenous Given 12/24/17 0128)  sodium chloride 0.9 % bolus 500 mL (500 mLs Intravenous New Bag/Given 12/24/17 0242)     Initial Impression / Assessment and Plan / ED Course  I have reviewed the triage vital signs and the nursing notes.  Pertinent labs & imaging results that were available during my care of the patient were reviewed by me and considered in my medical decision making (see chart for details).     84 year old male here with syncopal episode and GI bleed.  I suspect vasovagal versus orthostatic syncope in the setting of active, likely diverticular bleed.  Patient has extensive history of the same.  His hemoglobin is 12.  This does appear down from his recent value of 16, though he has been as low as 7 in the past.  Lab work is otherwise near baseline.  Discussed case with Dr. Carlean Purl of lumbar GI, who will see the patient in morning.  Patient to be n.p.o.  Type and screen sent.  Will closely monitor.  Final Clinical Impressions(s) / ED Diagnoses   Final diagnoses:  Lower GI bleed    ED Discharge Orders    None       Duffy Bruce, MD 12/24/17 1141

## 2017-12-24 DIAGNOSIS — I110 Hypertensive heart disease with heart failure: Secondary | ICD-10-CM | POA: Diagnosis not present

## 2017-12-24 DIAGNOSIS — K2950 Unspecified chronic gastritis without bleeding: Secondary | ICD-10-CM | POA: Diagnosis not present

## 2017-12-24 DIAGNOSIS — E119 Type 2 diabetes mellitus without complications: Secondary | ICD-10-CM | POA: Diagnosis not present

## 2017-12-24 DIAGNOSIS — K5731 Diverticulosis of large intestine without perforation or abscess with bleeding: Secondary | ICD-10-CM | POA: Diagnosis not present

## 2017-12-24 DIAGNOSIS — I480 Paroxysmal atrial fibrillation: Secondary | ICD-10-CM | POA: Diagnosis not present

## 2017-12-24 DIAGNOSIS — I10 Essential (primary) hypertension: Secondary | ICD-10-CM

## 2017-12-24 DIAGNOSIS — E876 Hypokalemia: Secondary | ICD-10-CM | POA: Diagnosis not present

## 2017-12-24 DIAGNOSIS — D62 Acute posthemorrhagic anemia: Secondary | ICD-10-CM | POA: Diagnosis present

## 2017-12-24 DIAGNOSIS — W19XXXA Unspecified fall, initial encounter: Secondary | ICD-10-CM | POA: Diagnosis not present

## 2017-12-24 DIAGNOSIS — Z8673 Personal history of transient ischemic attack (TIA), and cerebral infarction without residual deficits: Secondary | ICD-10-CM | POA: Diagnosis not present

## 2017-12-24 DIAGNOSIS — K922 Gastrointestinal hemorrhage, unspecified: Secondary | ICD-10-CM | POA: Diagnosis not present

## 2017-12-24 DIAGNOSIS — I5042 Chronic combined systolic (congestive) and diastolic (congestive) heart failure: Secondary | ICD-10-CM | POA: Diagnosis not present

## 2017-12-24 LAB — BASIC METABOLIC PANEL
ANION GAP: 8 (ref 5–15)
BUN: 25 mg/dL — ABNORMAL HIGH (ref 8–23)
CO2: 22 mmol/L (ref 22–32)
Calcium: 8 mg/dL — ABNORMAL LOW (ref 8.9–10.3)
Chloride: 107 mmol/L (ref 98–111)
Creatinine, Ser: 0.76 mg/dL (ref 0.61–1.24)
GFR calc non Af Amer: >60 mL/min (ref >60)
Glucose, Bld: 153 mg/dL — ABNORMAL HIGH (ref 70–99)
Potassium: 3.7 mmol/L (ref 3.5–5.1)
Sodium: 137 mmol/L (ref 135–145)

## 2017-12-24 LAB — HEMATOCRIT
HCT: 34.2 % — ABNORMAL LOW (ref 39.0–52.0)
HEMATOCRIT: 37 % — AB (ref 39.0–52.0)

## 2017-12-24 LAB — URINALYSIS, ROUTINE W REFLEX MICROSCOPIC
Bilirubin Urine: NEGATIVE
GLUCOSE, UA: NEGATIVE mg/dL
Hgb urine dipstick: NEGATIVE
Ketones, ur: NEGATIVE mg/dL
LEUKOCYTES UA: NEGATIVE
NITRITE: NEGATIVE
PH: 7 (ref 5.0–8.0)
PROTEIN: NEGATIVE mg/dL
Specific Gravity, Urine: 1.003 — ABNORMAL LOW (ref 1.005–1.030)

## 2017-12-24 LAB — GLUCOSE, CAPILLARY
GLUCOSE-CAPILLARY: 100 mg/dL — AB (ref 70–99)
GLUCOSE-CAPILLARY: 122 mg/dL — AB (ref 70–99)
GLUCOSE-CAPILLARY: 126 mg/dL — AB (ref 70–99)
GLUCOSE-CAPILLARY: 129 mg/dL — AB (ref 70–99)
Glucose-Capillary: 112 mg/dL — ABNORMAL HIGH (ref 70–99)
Glucose-Capillary: 117 mg/dL — ABNORMAL HIGH (ref 70–99)

## 2017-12-24 LAB — HEMOGLOBIN AND HEMATOCRIT, BLOOD
HCT: 33.8 % — ABNORMAL LOW (ref 39.0–52.0)
Hemoglobin: 10.6 g/dL — ABNORMAL LOW (ref 13.0–17.0)

## 2017-12-24 LAB — HEMOGLOBIN
HEMOGLOBIN: 11.5 g/dL — AB (ref 13.0–17.0)
HEMOGLOBIN: 10.5 g/dL — AB (ref 13.0–17.0)

## 2017-12-24 MED ORDER — LORAZEPAM 2 MG/ML IJ SOLN
0.5000 mg | Freq: Four times a day (QID) | INTRAMUSCULAR | Status: DC | PRN
  Administered 2017-12-24 – 2017-12-25 (×3): 0.5 mg via INTRAVENOUS
  Filled 2017-12-24 (×3): qty 1

## 2017-12-24 NOTE — Care Management Note (Signed)
Case Management Note  Patient Details  Name: CASTULO SCARPELLI MRN: [id number] Date of Birth: Feb 05, 1932  Subjective/Objective:  Pt presented for lower GI Bleed- per wife pt had a fall. PTA from [place] with wife. Pt will benefit from PT/OT consult.                   Action/Plan: CM will continue to monitor for additional disposition needs.   Expected Discharge Date:                  Expected Discharge Plan:  [facility](From [place])  In-House Referral:  NA  Discharge planning Services  CM Consult  Post Acute Care Choice:    Choice offered to:     DME Arranged:    DME Agency:     HH Arranged:    HH Agency:     Status of Service:  In process, will continue to follow  If discussed at Long Length of Stay Meetings, dates discussed:    Additional Comments:  Bethena Roys, RN 12/24/2017, 3:05 PM

## 2017-12-24 NOTE — Progress Notes (Signed)
[facility] PROGRESS NOTE  [name] YQM:[id number] DOB: 1931-10-20 DOA: 12/23/2017 PCP: Seward Carol, MD  Assessment/Plan:  1. Acute GI bleed  - Patient has hx of recurrent GIB's related to diverticulosis and currently taking plavix for afib. Denies abdominal pain, indigestion, or N/V  Hgb is 12.6 on admission, down from 16.8 in September. He was tachycardic and provided with IV fluids. No further episodes. He is hemodyanamically stable. Evaluated by gi who recommend clear liquid diet and capsule study if another episode. Considering colonoscopy as well.  -clear liquid -monitor intake and output -serial cbc -appreciate gi assistance -holding plavix  2. Acute blood loss anemia. See above -will transfuse if Hg 8 or below -monitor   2. Syncope  - Became acutely lightheaded upon standing and suffered transient LOC, quickly recovering. Denied chest discomfort or palpitations associated with this, sinus rhythm on EKG, could not tolerate orthostatic vitals in ED d/t severe lightheadedness with sitting up. CT head  with no acute abnormality  - received IV fluids. Now is having clear liquids.   -will recheck orthostatic VS -hold anti-hypertensive home meds  3. Type II DM  - A1c was 6.2% in January 2018  - Managed with metformin at home, held on admission  - Check CBG's and use a low-intensity SSI with Novolog as needed while in hospital    4. History of CVA  - Denies any focal deficits  - Plavix held in light of acute GIB, statin held while NPO    5. Chronic combined systolic and diastolic CHF  - remains compensated  -  follow daily wt and I/O's  -home BB on hold  6. Paroxysmal atrial fibrillation  - In sinus rhythm on admission  - CHADS-VASc is at least 31 (age x2, CVA x2, DM, CHF)  - Not anticoagulated d/t recurrent GIB  - Plavix held for acute bleed    7. Leukocytosis  - WBC is 17,600 on admission without fever.  - Appears to be some chronic  leukocytosismunication: -will obtain urinealyis     Family communication wife at bedside Disposition Plan: home 48 hours   Consultants:  Elwyn Reach  Procedures:  none  Antibiotics:    HPI/Subjective: Presented with dark stools and syncope. Hx pan diverticulosis and is taking plavix for afib/stroke. Hg stable. No further episodes gi on board  Tolerating clear liquids today. Denies pain/discomfort  Objective: Vitals:   12/24/17 0901 12/24/17 1002  BP:  107/66  Pulse:  88  Resp:  18  Temp:  98.4 F (36.9 C)  SpO2: 98% 97%    Intake/Output Summary (Last 24 hours) at 12/24/2017 1403 Last data filed at 12/24/2017 1355 Gross per 24 hour  Intake 898.35 ml  Output 300 ml  Net 598.35 ml   Filed Weights   12/23/17 2030  Weight: 72.6 kg    Exam:   General:  Awake alert in no acute distress  Cardiovascular: rrr no mgr no LE edema  Respiratory: normal effort BS clear bilaterally no wheeze  Abdomen: non-distended sluggish BS non tender no guarding or rebounding  Musculoskeletal: joints without swelling/erythema full rom poor muscle tone   Data Reviewed: Basic Metabolic Panel: Recent Labs  Lab 12/23/17 2050 12/24/17 0159  NA 134* 137  K 3.9 3.7  CL 104 107  CO2 23 22  GLUCOSE 138* 153*  BUN 20 25*  CREATININE 0.75 0.76  CALCIUM 7.2* 8.0*   Liver Function Tests: Recent Labs  Lab 12/23/17 2050  AST 14*  ALT 11  ALKPHOS 44  BILITOT 0.5  PROT 5.1*  ALBUMIN 2.7*   No results for input(s): LIPASE, AMYLASE in the last 168 hours. No results for input(s): AMMONIA in the last 168 hours. CBC: Recent Labs  Lab 12/23/17 2050 12/24/17 0159 12/24/17 0922  WBC 17.6*  --   --   HGB 12.6* 11.5* 10.5*  HCT 39.8 37.0* 34.2*  MCV 99.3  --   --   PLT 288  --   --    Cardiac Enzymes: No results for input(s): CKTOTAL, CKMB, CKMBINDEX, TROPONINI in the last 168 hours. BNP (last 3 results) No results for input(s): BNP in the last 8760 hours.  ProBNP (last  3 results) No results for input(s): PROBNP in the last 8760 hours.  CBG: Recent Labs  Lab 12/24/17 0038 12/24/17 0443 12/24/17 0753 12/24/17 1130  GLUCAP 129* 126* 112* 100*    No results found for this or any previous visit (from the past 240 hour(s)).   Studies: Ct Head Wo Contrast  Result Date: 12/23/2017 CLINICAL DATA:  Posttraumatic headache. Syncope. Fall with head trauma. EXAM: CT HEAD WITHOUT CONTRAST CT CERVICAL SPINE WITHOUT CONTRAST TECHNIQUE: Multidetector CT imaging of the head and cervical spine was performed following the standard protocol without intravenous contrast. Multiplanar CT image reconstructions of the cervical spine were also generated. COMPARISON:  Head CT 10/24/2016 FINDINGS: CT HEAD FINDINGS Brain: There is no mass, hemorrhage or extra-axial collection. There is generalized atrophy without lobar predilection. Areas of hypoattenuation of the deep gray nuclei and confluent periventricular white matter hypodensity, consistent with chronic small vessel disease. Vascular: Atherosclerotic calcification of the internal carotid arteries at the skull base. No abnormal hyperdensity of the major intracranial arteries or dural venous sinuses. Skull: The visualized skull base, calvarium and extracranial soft tissues are normal. Sinuses/Orbits: Diffuse moderate paranasal sinus opacification. Findings of prior bilateral maxillary antrostomies. The orbits are normal. CT CERVICAL SPINE FINDINGS Alignment: Grade 1 anterolisthesis at C4-5 due to facet hypertrophy. Alignment is otherwise normal. Skull base and vertebrae: No acute fracture. Soft tissues and spinal canal: No prevertebral fluid or swelling. No visible canal hematoma. Disc levels: Multilevel facet hypertrophy with neural foraminal stenosis that is worst at right C3-4, right C4-5. Upper chest: No pneumothorax, pulmonary nodule or pleural effusion. Other: Normal visualized paraspinal cervical soft tissues. IMPRESSION: 1.  Generalized atrophy and chronic small vessel ischemia without acute intracranial abnormality. 2. No acute abnormality of the cervical spine. 3. Multilevel facet arthrosis contributing to grade 1 C4-5 anterolisthesis and severe foraminal stenosis on the right at C3-4 and C4-5. Electronically Signed   By: Ulyses Jarred M.D.   On: 12/23/2017 23:14   Ct Cervical Spine Wo Contrast  Result Date: 12/23/2017 CLINICAL DATA:  Posttraumatic headache. Syncope. Fall with head trauma. EXAM: CT HEAD WITHOUT CONTRAST CT CERVICAL SPINE WITHOUT CONTRAST TECHNIQUE: Multidetector CT imaging of the head and cervical spine was performed following the standard protocol without intravenous contrast. Multiplanar CT image reconstructions of the cervical spine were also generated. COMPARISON:  Head CT 10/24/2016 FINDINGS: CT HEAD FINDINGS Brain: There is no mass, hemorrhage or extra-axial collection. There is generalized atrophy without lobar predilection. Areas of hypoattenuation of the deep gray nuclei and confluent periventricular white matter hypodensity, consistent with chronic small vessel disease. Vascular: Atherosclerotic calcification of the internal carotid arteries at the skull base. No abnormal hyperdensity of the major intracranial arteries or dural venous sinuses. Skull: The visualized skull base, calvarium and extracranial soft tissues are normal. Sinuses/Orbits: Diffuse moderate paranasal sinus  opacification. Findings of prior bilateral maxillary antrostomies. The orbits are normal. CT CERVICAL SPINE FINDINGS Alignment: Grade 1 anterolisthesis at C4-5 due to facet hypertrophy. Alignment is otherwise normal. Skull base and vertebrae: No acute fracture. Soft tissues and spinal canal: No prevertebral fluid or swelling. No visible canal hematoma. Disc levels: Multilevel facet hypertrophy with neural foraminal stenosis that is worst at right C3-4, right C4-5. Upper chest: No pneumothorax, pulmonary nodule or pleural effusion.  Other: Normal visualized paraspinal cervical soft tissues. IMPRESSION: 1. Generalized atrophy and chronic small vessel ischemia without acute intracranial abnormality. 2. No acute abnormality of the cervical spine. 3. Multilevel facet arthrosis contributing to grade 1 C4-5 anterolisthesis and severe foraminal stenosis on the right at C3-4 and C4-5. Electronically Signed   By: Ulyses Jarred M.D.   On: 12/23/2017 23:14    Scheduled Meds: . insulin aspart  0-9 Units Subcutaneous Q4H  . mometasone-formoterol  1 puff Inhalation BID  . sodium chloride flush  3 mL Intravenous Q12H   Continuous Infusions:  Principal Problem:   Acute lower GI bleeding Active Problems:   Acute blood loss anemia   Diabetes mellitus type 2, diet-controlled (HCC)   History of CVA (cerebrovascular accident)   Benign essential HTN   PAF (paroxysmal atrial fibrillation) ([place])   Syncope due to orthostatic hypotension   Chronic combined systolic and diastolic CHF (congestive heart failure) (HCC)   Leukocytosis    Time spent: 45 minutes    Jack NP [facility]  If 7PM-7AM, please contact night-coverage at [email or web address], password [facility] 12/24/2017, 2:03 PM  LOS: 0 days

## 2017-12-24 NOTE — Care Management Obs Status (Signed)
[place] NOTIFICATION   Patient Details  Name: KERRIGAN GLENDENING MRN: [id number] Date of Birth: 1932-01-27   Medicare Observation Status Notification Given:  Yes    Bethena Roys, RN 12/24/2017, 3:00 PM

## 2017-12-24 NOTE — Consult Note (Signed)
[facility] Consult: 10:40 AM 12/24/2017  LOS: 0 days    Referring Provider: Dr Eliseo Squires  Primary Care Physician:  Seward Carol, MD Primary Gastroenterologist:  Dr. Owens Loffler, previously Dr. Marga Hoots    Reason for Consultation: Painless hematochezia.   HPI: [name] is a 84 y.o. male.  Past Hx Stroke 07/2015, recurrent stroke in 02/2016.  Chronic Plavix.  DM 2.   COPD.  Seizures.  Hyperlipidemia.  Rectal bleeding/painless hematochezia, resume diverticular bleed versus hemorrhoidal, 01/2016 with negative nuclear medicine bleeding scan.  Recurrent lower GI bleeding in 10/2016.  Paraesophageal hernia repair 2004.  Ventral hernia repair 2013.  Splenectomy, due to traumatic injury ~ 2007.  Multiple previous sigmoid and colonoscopies dating back to the mid 1980s. Area of narrowing associated with severe left-sided diverticulosis, internal hemorrhoids 1985.  Segmental colitis in the rectum 1990.  Biopsies 1990 showed chronic ileitis with lymphoid hyperplasia and ulcerative colitis from the sigmoid biopsies  Proctitis and diverticulosis 1995.  On all studies diverticulosis has been noted.  Not clear when he had polyps as there are none noted on the extensive records. 2003 EGD.  For dysphagia.  Found chronic gastritis and duodenitis. 2003 colonoscopy.  Diverticulosis, no polyps. 07/2007 Colonoscopy for reported adenomatous polyps diverticulosis noted.  2004 colonoscopy history of adenomatous polyps in 2003. 11/2016 colonoscopy to evaluate painless hematochezia.  Colon prep fair in the left colon, poor in the right colon.  Left sided diverticulosis without active bleeding.  Brown liquid stool seen throughout the colon.  Admitted yesterday after developing dark stools, frank rectal bleeding midday at home associated  with syncope.  Lightheaded in the ED but not hypotensive.  Some episodes of tachycardia into the low 100s -1 teens.  Has not had any abdominal pain.  He does report increased flatus.  He had several of these episodes yesterday but they slowed down and he had a small darker, still obviously bloody, stool this morning.  He has been kept n.p.o. Hgb 12.6 >> 11.5 >> 10.5 over last 12 hours.  Baseline Hgb widely varies, it was 14.3 in February, 16.8 in September MCV 99.  Hgb was 16.8 on 11/05/2017. BUN slightly elevated at 25, normal creatinine and normal GFR. CT head to assess for trauma shows atrophy and chronic small vessel ischemia, incidental cervical spine arthrosis and stenosis.  Patient last took Plavix yesterday morning, 11/7.  He does not use aspirin or NSAIDs.  Minor rectal bleeding.  Past Medical History:  Diagnosis Date  . Acute CVA (cerebrovascular accident) ([place]) 07/25/2015  . Asthma   . Benign essential HTN   . Cerebral infarction due to embolism of left middle cerebral artery ([place]) 02/03/2014  . Chronic combined systolic and diastolic CHF (congestive heart failure) ([place]) 12/23/2017  . Colitis   . Colon polyps   . Diabetes mellitus type 2, diet-controlled ([place]) 12/22/2013  . Diverticulosis   . GERD (gastroesophageal reflux disease)   . GI bleed   . Hemorrhoids   . Hiatal hernia   . History of esophageal strciture   . HLD (hyperlipidemia)   .  Osteoarthritis   . Proctitis   . Status post dilation of esophageal narrowing   . Stroke [facility])     Past Surgical History:  Procedure Laterality Date  . ABDOMINAL HERNIA REPAIR    . COLONOSCOPY     multiple  . COLONOSCOPY WITH PROPOFOL N/A 12/12/2016   Procedure: COLONOSCOPY WITH PROPOFOL;  Surgeon: Gatha Mayer, MD;  Location[facility] ENDOSCOPY;  Service: Endoscopy;  Laterality: N/A;  . ESOPHAGOGASTRODUODENOSCOPY     multiple  . INSERTION OF MESH  01/29/2012   Procedure: INSERTION OF MESH;  Surgeon: Gwenyth Ober, MD;  Location: [place];   Service: General;  Laterality: N/A;  . NISSEN FUNDOPLICATION    . SPLENECTOMY, TOTAL     nontraumatic rupture  . VENTRAL HERNIA REPAIR  01/29/2012    WITH MESH  . VENTRAL HERNIA REPAIR  01/29/2012   Procedure: HERNIA REPAIR VENTRAL ADULT;  Surgeon: Gwenyth Ober, MD;  Location: [place];  Service: General;  Laterality: N/A;  open recurrent ventral hernia repair with mesh    Prior to Admission medications   Medication Sig Start Date End Date Taking? Authorizing Provider  acetaminophen (TYLENOL) 500 MG tablet Take 1,000 mg by mouth every 6 (six) hours as needed for headache (pain).   Yes [provider]  albuterol (PROAIR HFA) 108 (90 Base) MCG/ACT inhaler Inhale 2 puffs into the lungs every 6 (six) hours as needed for wheezing or shortness of breath.   Yes [provider]  baclofen (LIORESAL) 10 MG tablet Take 0.5 tablets (5 mg total) by mouth at bedtime as needed for muscle spasms. Patient taking differently: Take 10 mg by mouth at bedtime as needed for muscle spasms.  10/25/16  Yes Debbe Odea, MD  carvedilol (COREG) 3.125 MG tablet Take 1 tablet (3.125 mg total) by mouth 2 (two) times daily with a meal. 02/26/16  Yes Amin, Ankit Chirag, MD  cetirizine (ZYRTEC) 10 MG tablet Take 10 mg by mouth at bedtime.    Yes [provider]  clopidogrel (PLAVIX) 75 MG tablet Take 1 tablet (75 mg total) by mouth daily. 11/25/16  Yes Garvin Fila, MD  fluticasone (FLONASE) 50 MCG/ACT nasal spray Place 1 spray into both nostrils every morning.  06/18/14  Yes [provider]  guaiFENesin (MUCINEX) 600 MG 12 hr tablet Take 600 mg by mouth daily as needed for cough or to loosen phlegm.    Yes [provider]  Melatonin 1 MG TABS Take 5 mg by mouth at bedtime as needed (sleep).    Yes [provider]  metFORMIN (GLUCOPHAGE) 500 MG tablet Take 1,000 mg by mouth daily with breakfast.   Yes [provider]  mometasone-formoterol (DULERA) 100-5 MCG/ACT AERO  Inhale 2 puffs into the lungs 2 (two) times daily. Patient taking differently: Inhale 1 puff into the lungs 2 (two) times daily.  12/12/15  Yes Tanda Rockers, MD  montelukast (SINGULAIR) 10 MG tablet Take 1 tablet (10 mg total) by mouth at bedtime. 10/05/17  Yes Tanda Rockers, MD  psyllium (METAMUCIL) 58.6 % powder Take 1 packet by mouth every morning.    Yes [provider]  rOPINIRole (REQUIP) 0.25 MG tablet Take 1 tablet (0.25 mg total) by mouth at bedtime. Patient taking differently: Take 0.5 mg by mouth at bedtime.  02/26/16  Yes Amin, Jeanella Flattery, MD  simvastatin (ZOCOR) 40 MG tablet Take 1 tablet (40 mg total) by mouth daily. Patient taking differently: Take 40 mg by mouth at  bedtime.  02/26/16  Yes Amin, Ankit Chirag, MD  zolpidem (AMBIEN) 10 MG tablet Take 5-10 mg by mouth at bedtime as needed for sleep.    Yes [provider]    Scheduled Meds: . insulin aspart  0-9 Units Subcutaneous Q4H  . mometasone-formoterol  1 puff Inhalation BID  . sodium chloride flush  3 mL Intravenous Q12H   Infusions:  PRN Meds: acetaminophen **OR** acetaminophen, albuterol, LORazepam, ondansetron **OR** ondansetron (ZOFRAN) IV   Allergies as of 12/23/2017 - Review Complete 12/23/2017  Allergen Reaction Noted  . Azithromycin Rash and Other (See Comments) 03/08/2015    Family History  Problem Relation Age of Onset  . Cancer Father     Social History   Socioeconomic History  . Marital status: Married    Spouse name: [name]  . Number of children: 2  . Years of education: Bachelor's  . Highest education level: Not on file  Occupational History  . Occupation: retired  Scientific laboratory technician  . Financial resource strain: Not on file  . Food insecurity:    Worry: Not on file    Inability: Not on file  . Transportation needs:    Medical: Not on file    Non-medical: Not on file  Tobacco Use  . Smoking status: Never Smoker  . Smokeless tobacco: Never Used  Substance and Sexual  Activity  . Alcohol use: Yes    Alcohol/week: 1.0 standard drinks    Types: 1 Glasses of wine per week    Comment: daily  . Drug use: No  . Sexual activity: Not on file  Lifestyle  . Physical activity:    Days per week: Not on file    Minutes per session: Not on file  . Stress: Not on file  Relationships  . Social connections:    Talks on phone: Not on file    Gets together: Not on file    Attends religious service: Not on file    Active member of club or organization: Not on file    Attends meetings of clubs or organizations: Not on file    Relationship status: Not on file  . Intimate partner violence:    Fear of current or ex partner: Not on file    Emotionally abused: Not on file    Physically abused: Not on file    Forced sexual activity: Not on file  Other Topics Concern  . Not on file  Social History Narrative   Patient is married and has 2 children.   Patient is right handed.   Patient has Bachelor's degree.   Patient drinks 2 cups daily.    REVIEW OF SYSTEMS: Constitutional: Limited mobility because of his strokes and left-sided weakness.  He ambulates with a walker.  No new fatigue ENT:  No nose bleeds Pulm: Uses MDI and nebulizers for his asthma.  Stable respiratory symptoms.  Does not exercise enough to develop DOE. CV:  No palpitations, no LE edema.  GU:  No hematuria, no frequency GI:  Per HPI.  No nausea.  No dysphagia.  No heartburn. Heme: Other than the GI bleeding has not had any unusual or excessive bleeding or bruising. Transfusions: Has required blood transfusions during past episodes of hematochezia. Neuro: Syncope yesterday.  Normally does not have headaches, peripheral tingling or numbness.  Left-sided weakness/paresis.  No seizures. Derm:  No itching, no rash or sores.  Endocrine:  No sweats or chills.  No polyuria or dysuria Immunization: Not inquire as to recent immunization.  Travel: Do a lot of traveling these days [place]   PHYSICAL EXAM: Vital signs in last 24 hours: Vitals:   12/24/17 0901 12/24/17 1002  BP:  107/66  Pulse:  88  Resp:  18  Temp:  98.4 F (36.9 C)  SpO2: 98% 97%   Wt Readings from Last 3 Encounters:  12/23/17 72.6 kg  11/05/17 71.4 kg  06/09/17 71.1 kg    General: Pleasant, alert, comfortable, frail-appearing aged but not acutely ill looking WM Head: No facial asymmetry or swelling.  No signs of head trauma. Eyes: Conjunctiva pink.  EOMI. Ears: Slightly hard of hearing Nose: No discharge or congestion Mouth: Lower dentures.  Otherwise native teeth in good repair.  Tongue midline.  Oral mucosa pink, moist, clear. Neck: No JVD, no masses, no thyromegaly. Lungs: Clear bilaterally.  Somewhat reduced breath sounds in the upper fields.  No shortness of breath or cough. Heart: RRR.  No MRG.  S1, S2 present. Abdomen: Soft.  Not tender or distended.  Bowel sounds active.  Large scar consistent with his splenectomy and then hernia repair..   Rectal: Deferred Musc/Skeltl: No joint redness, swelling or contractures. Extremities:  No CCE.  Neurologic:  Able to move all 4 limbs but movement very limited on the left upper extremity, weakness in the left upper extremity.  No tremors. Skin: No rashes or sores. Tattoos: None Nodes: No cervical adenopathy Psych: Cooperative, pleasant, calm.  Mild paucity of speech.  Intake/Output from previous day: 11/07 0701 - 11/08 0700 In: 658.4 [I.V.:408.4; IV Piggyback:250] Out: 100 [Urine:100] Intake/Output this shift: No intake/output data recorded.  LAB RESULTS: Recent Labs    12/23/17 2050 12/24/17 0159 12/24/17 0922  WBC 17.6*  --   --   HGB 12.6* 11.5* 10.5*  HCT 39.8 37.0* 34.2*  PLT 288  --   --    BMET Lab Results  Component Value Date   NA 137 12/24/2017   NA 134 (L) 12/23/2017   NA 137 12/13/2016   K 3.7 12/24/2017   K 3.9 12/23/2017   K 3.4 (L) 12/13/2016   CL 107 12/24/2017   CL 104  12/23/2017   CL 107 12/13/2016   CO2 22 12/24/2017   CO2 23 12/23/2017   CO2 23 12/13/2016   GLUCOSE 153 (H) 12/24/2017   GLUCOSE 138 (H) 12/23/2017   GLUCOSE 109 (H) 12/13/2016   BUN 25 (H) 12/24/2017   BUN 20 12/23/2017   BUN 12 12/13/2016   CREATININE 0.76 12/24/2017   CREATININE 0.75 12/23/2017   CREATININE 0.74 12/13/2016   CALCIUM 8.0 (L) 12/24/2017   CALCIUM 7.2 (L) 12/23/2017   CALCIUM 7.8 (L) 12/13/2016   LFT Recent Labs    12/23/17 2050  PROT 5.1*  ALBUMIN 2.7*  AST 14*  ALT 11  ALKPHOS 44  BILITOT 0.5   PT/INR Lab Results  Component Value Date   INR 1.07 10/24/2016   INR 1.12 02/27/2016   INR 1.29 02/26/2016   Hepatitis Panel No results for input(s): HEPBSAG, HCVAB, HEPAIGM, HEPBIGM in the last 72 hours. C-Diff No components found for: CDIFF Lipase  No results found for: LIPASE  Drugs of Abuse     Component Value Date/Time   LABOPIA NONE DETECTED 02/22/2016 1340   COCAINSCRNUR NONE DETECTED 02/22/2016 1340   LABBENZ NONE DETECTED 02/22/2016 1340   AMPHETMU NONE DETECTED 02/22/2016 1340   THCU NONE DETECTED 02/22/2016 1340   LABBARB NONE DETECTED 02/22/2016  [place]: Ct Head Wo Contrast  Result Date: 12/23/2017 CLINICAL DATA:  Posttraumatic headache. Syncope. Fall with head trauma. EXAM: CT HEAD WITHOUT CONTRAST CT CERVICAL SPINE WITHOUT CONTRAST TECHNIQUE: Multidetector CT imaging of the head and cervical spine was performed following the standard protocol without intravenous contrast. Multiplanar CT image reconstructions of the cervical spine were also generated. COMPARISON:  Head CT 10/24/2016 FINDINGS: CT HEAD FINDINGS Brain: There is no mass, hemorrhage or extra-axial collection. There is generalized atrophy without lobar predilection. Areas of hypoattenuation of the deep gray nuclei and confluent periventricular white matter hypodensity, consistent with chronic small vessel disease. Vascular: Atherosclerotic calcification of  the internal carotid arteries at the skull base. No abnormal hyperdensity of the major intracranial arteries or dural venous sinuses. Skull: The visualized skull base, calvarium and extracranial soft tissues are normal. Sinuses/Orbits: Diffuse moderate paranasal sinus opacification. Findings of prior bilateral maxillary antrostomies. The orbits are normal. CT CERVICAL SPINE FINDINGS Alignment: Grade 1 anterolisthesis at C4-5 due to facet hypertrophy. Alignment is otherwise normal. Skull base and vertebrae: No acute fracture. Soft tissues and spinal canal: No prevertebral fluid or swelling. No visible canal hematoma. Disc levels: Multilevel facet hypertrophy with neural foraminal stenosis that is worst at right C3-4, right C4-5. Upper chest: No pneumothorax, pulmonary nodule or pleural effusion. Other: Normal visualized paraspinal cervical soft tissues. IMPRESSION: 1. Generalized atrophy and chronic small vessel ischemia without acute intracranial abnormality. 2. No acute abnormality of the cervical spine. 3. Multilevel facet arthrosis contributing to grade 1 C4-5 anterolisthesis and severe foraminal stenosis on the right at C3-4 and C4-5. Electronically Signed   By: Ulyses Jarred M.D.   On: 12/23/2017 23:14   Ct Cervical Spine Wo Contrast  Result Date: 12/23/2017 CLINICAL DATA:  Posttraumatic headache. Syncope. Fall with head trauma. EXAM: CT HEAD WITHOUT CONTRAST CT CERVICAL SPINE WITHOUT CONTRAST TECHNIQUE: Multidetector CT imaging of the head and cervical spine was performed following the standard protocol without intravenous contrast. Multiplanar CT image reconstructions of the cervical spine were also generated. COMPARISON:  Head CT 10/24/2016 FINDINGS: CT HEAD FINDINGS Brain: There is no mass, hemorrhage or extra-axial collection. There is generalized atrophy without lobar predilection. Areas of hypoattenuation of the deep gray nuclei and confluent periventricular white matter hypodensity, consistent with  chronic small vessel disease. Vascular: Atherosclerotic calcification of the internal carotid arteries at the skull base. No abnormal hyperdensity of the major intracranial arteries or dural venous sinuses. Skull: The visualized skull base, calvarium and extracranial soft tissues are normal. Sinuses/Orbits: Diffuse moderate paranasal sinus opacification. Findings of prior bilateral maxillary antrostomies. The orbits are normal. CT CERVICAL SPINE FINDINGS Alignment: Grade 1 anterolisthesis at C4-5 due to facet hypertrophy. Alignment is otherwise normal. Skull base and vertebrae: No acute fracture. Soft tissues and spinal canal: No prevertebral fluid or swelling. No visible canal hematoma. Disc levels: Multilevel facet hypertrophy with neural foraminal stenosis that is worst at right C3-4, right C4-5. Upper chest: No pneumothorax, pulmonary nodule or pleural effusion. Other: Normal visualized paraspinal cervical soft tissues. IMPRESSION: 1. Generalized atrophy and chronic small vessel ischemia without acute intracranial abnormality. 2. No acute abnormality of the cervical spine. 3. Multilevel facet arthrosis contributing to grade 1 C4-5 anterolisthesis and severe foraminal stenosis on the right at C3-4 and C4-5. Electronically Signed   By: Ulyses Jarred M.D.   On: 12/23/2017 23:14     IMPRESSION:   *     Painless hematochezia.  This is almost certainly a recurrent diverticular  bleed.  He has had these on multiple occasions in the past. Need repeat colonoscopy as this was just performed in 2018. Seems to be slowing, in process of resolving so will not order nuclear medicine bleeding scan.  *     Chronic Plavix, last dose yesterday morning 11/7.  This is on hold.  Unfortunately because of his history of previous multiple strokes, it does not an option for him to permanently stop Plavix.  *    Blood loss anemia.  Not in need of transfusion at present.  PLAN:     *    If he develops recurrent large-volume  hematochezia, will plan on ordering nuclear medicine bleeding scan. Plans for colonoscopy.  *   Hgb, hematocrit at 5 PM tonight.  CBC in the morning.  *     Ordered clear liquid diet.  It is possible he could have advancement to full liquids or regular solid food but I will defer this decision to Dr. Valetta Fuller  12/24/2017, 10:40 AM Phone [phone]

## 2017-12-25 DIAGNOSIS — E119 Type 2 diabetes mellitus without complications: Secondary | ICD-10-CM | POA: Diagnosis not present

## 2017-12-25 DIAGNOSIS — K2950 Unspecified chronic gastritis without bleeding: Secondary | ICD-10-CM | POA: Diagnosis not present

## 2017-12-25 DIAGNOSIS — K5790 Diverticulosis of intestine, part unspecified, without perforation or abscess without bleeding: Secondary | ICD-10-CM | POA: Diagnosis not present

## 2017-12-25 DIAGNOSIS — I10 Essential (primary) hypertension: Secondary | ICD-10-CM | POA: Diagnosis not present

## 2017-12-25 DIAGNOSIS — D62 Acute posthemorrhagic anemia: Secondary | ICD-10-CM | POA: Diagnosis not present

## 2017-12-25 DIAGNOSIS — Z8673 Personal history of transient ischemic attack (TIA), and cerebral infarction without residual deficits: Secondary | ICD-10-CM | POA: Diagnosis not present

## 2017-12-25 DIAGNOSIS — K922 Gastrointestinal hemorrhage, unspecified: Secondary | ICD-10-CM | POA: Diagnosis not present

## 2017-12-25 DIAGNOSIS — I110 Hypertensive heart disease with heart failure: Secondary | ICD-10-CM | POA: Diagnosis not present

## 2017-12-25 DIAGNOSIS — K5731 Diverticulosis of large intestine without perforation or abscess with bleeding: Secondary | ICD-10-CM | POA: Diagnosis not present

## 2017-12-25 DIAGNOSIS — D72829 Elevated white blood cell count, unspecified: Secondary | ICD-10-CM | POA: Diagnosis not present

## 2017-12-25 DIAGNOSIS — E876 Hypokalemia: Secondary | ICD-10-CM | POA: Diagnosis not present

## 2017-12-25 DIAGNOSIS — W19XXXA Unspecified fall, initial encounter: Secondary | ICD-10-CM | POA: Diagnosis not present

## 2017-12-25 DIAGNOSIS — I5042 Chronic combined systolic (congestive) and diastolic (congestive) heart failure: Secondary | ICD-10-CM | POA: Diagnosis not present

## 2017-12-25 DIAGNOSIS — I480 Paroxysmal atrial fibrillation: Secondary | ICD-10-CM | POA: Diagnosis not present

## 2017-12-25 LAB — CBC WITH DIFFERENTIAL/PLATELET
Abs Immature Granulocytes: 0.03 10*3/uL (ref 0.00–0.07)
Basophils Absolute: 0.1 10*3/uL (ref 0.0–0.1)
Basophils Relative: 1 %
EOS ABS: 0.9 10*3/uL — AB (ref 0.0–0.5)
EOS PCT: 8 %
HEMATOCRIT: 32.3 % — AB (ref 39.0–52.0)
HEMOGLOBIN: 10.3 g/dL — AB (ref 13.0–17.0)
Immature Granulocytes: 0 %
LYMPHS ABS: 3.5 10*3/uL (ref 0.7–4.0)
LYMPHS PCT: 33 %
MCH: 31.5 pg (ref 26.0–34.0)
MCHC: 31.9 g/dL (ref 30.0–36.0)
MCV: 98.8 fL (ref 80.0–100.0)
MONO ABS: 1.2 10*3/uL — AB (ref 0.1–1.0)
Monocytes Relative: 11 %
Neutro Abs: 4.9 10*3/uL (ref 1.7–7.7)
Neutrophils Relative %: 47 %
Platelets: 281 10*3/uL (ref 150–400)
RBC: 3.27 MIL/uL — AB (ref 4.22–5.81)
RDW: 14.3 % (ref 11.5–15.5)
WBC: 10.5 10*3/uL (ref 4.0–10.5)
nRBC: 0 % (ref 0.0–0.2)

## 2017-12-25 LAB — GLUCOSE, CAPILLARY
GLUCOSE-CAPILLARY: 98 mg/dL (ref 70–99)
GLUCOSE-CAPILLARY: 153 mg/dL — AB (ref 70–99)
GLUCOSE-CAPILLARY: 98 mg/dL (ref 70–99)
GLUCOSE-CAPILLARY: 158 mg/dL — AB (ref 70–99)
GLUCOSE-CAPILLARY: 129 mg/dL — AB (ref 70–99)
Glucose-Capillary: 94 mg/dL (ref 70–99)
Glucose-Capillary: 97 mg/dL (ref 70–99)

## 2017-12-25 LAB — BASIC METABOLIC PANEL
Anion gap: 5 (ref 5–15)
BUN: 12 mg/dL (ref 8–23)
CALCIUM: 7.9 mg/dL — AB (ref 8.9–10.3)
CHLORIDE: 108 mmol/L (ref 98–111)
CO2: 26 mmol/L (ref 22–32)
CREATININE: 0.73 mg/dL (ref 0.61–1.24)
GFR calc Af Amer: >60 mL/min (ref >60)
GFR calc non Af Amer: >60 mL/min (ref >60)
Glucose, Bld: 100 mg/dL — ABNORMAL HIGH (ref 70–99)
Potassium: 3.3 mmol/L — ABNORMAL LOW (ref 3.5–5.1)
SODIUM: 139 mmol/L (ref 135–145)

## 2017-12-25 LAB — HEMOGLOBIN AND HEMATOCRIT, BLOOD
HCT: 35.6 % — ABNORMAL LOW (ref 39.0–52.0)
HCT: 32.2 % — ABNORMAL LOW (ref 39.0–52.0)
Hemoglobin: 11.4 g/dL — ABNORMAL LOW (ref 13.0–17.0)
Hemoglobin: 10.3 g/dL — ABNORMAL LOW (ref 13.0–17.0)

## 2017-12-25 MED ORDER — INSULIN ASPART 100 UNIT/ML ~~LOC~~ SOLN: 0.0000 [IU] | Freq: Every day | SUBCUTANEOUS | Status: DC

## 2017-12-25 MED ORDER — POTASSIUM CHLORIDE CRYS ER 20 MEQ PO TBCR
40.0000 meq | EXTENDED_RELEASE_TABLET | Freq: Once | ORAL | Status: AC
  Administered 2017-12-25: 40 meq via ORAL
  Filled 2017-12-25: qty 2

## 2017-12-25 MED ORDER — INSULIN ASPART 100 UNIT/ML ~~LOC~~ SOLN
0.0000 [IU] | Freq: Three times a day (TID) | SUBCUTANEOUS | Status: DC
  Administered 2017-12-26: 1 [IU] via SUBCUTANEOUS

## 2017-12-25 NOTE — Progress Notes (Signed)
Patient ID: [name], male   DOB: November 29, 1931, 84 y.o.   MRN: [id number]     Progress Note   Subjective    Doing well - up un chair , wife in room . Hungry  No stools or bleeding since yesterday afternoon  HGB stable 10.3 this am, 10.6 last pm  Last dose plavix was 11/7   Objective   Vital signs in last 24 hours: Temp:  [97.5 F (36.4 C)-98.4 F (36.9 C)] 97.5 F (36.4 C) (11/09 0501) Pulse Rate:  [75-89] 75 (11/09 0751) Resp:  [16-18] 18 (11/09 0751) BP: (107-142)/(58-97) 119/58 (11/09 0751) SpO2:  [96 %-98 %] 96 % (11/09 0753) Weight:  [72.6 kg] 72.6 kg (11/08 2158) Last BM Date: 12/24/17 General:   Elderly  white male  in NAD Heart:  Regular rate and rhythm; no murmurs Lungs: Respirations even and unlabored, lungs CTA bilaterally Abdomen:  Soft, nontender and nondistended. Normal bowel sounds. Extremities:  Without edema. Neurologic:  Alert and oriented,  grossly normal neurologically. Psych:  Cooperative. Normal mood and affect.  Intake/Output from previous day: 11/08 0701 - 11/09 0700 In: 480 [P.O.:480] Out: 2265 [Urine:2265] Intake/Output this shift: No intake/output data recorded.  Lab Results: Recent Labs    12/23/17 2050  12/24/17 0922 12/24/17 1630 12/25/17 0446  WBC 17.6*  --   --   --  10.5  HGB 12.6*   < > 10.5* 10.6* 10.3*  HCT 39.8   < > 34.2* 33.8* 32.3*  PLT 288  --   --   --  281   < > = values in this interval not displayed.   BMET Recent Labs    12/23/17 2050 12/24/17 0159 12/25/17 0446  NA 134* 137 139  K 3.9 3.7 3.3*  CL 104 107 108  CO2 23 22 26   GLUCOSE 138* 153* 100*  BUN 20 25* 12  CREATININE 0.75 0.76 0.73  CALCIUM 7.2* 8.0* 7.9*   LFT Recent Labs    12/23/17 2050  PROT 5.1*  ALBUMIN 2.7*  AST 14*  ALT 11  ALKPHOS 44  BILITOT 0.5   PT/INR No results for input(s): LABPROT, INR in the last 72 hours.  Studies/Results: Ct Head Wo Contrast  Result Date: 12/23/2017 CLINICAL DATA:  Posttraumatic headache.  Syncope. Fall with head trauma. EXAM: CT HEAD WITHOUT CONTRAST CT CERVICAL SPINE WITHOUT CONTRAST TECHNIQUE: Multidetector CT imaging of the head and cervical spine was performed following the standard protocol without intravenous contrast. Multiplanar CT image reconstructions of the cervical spine were also generated. COMPARISON:  Head CT 10/24/2016 FINDINGS: CT HEAD FINDINGS Brain: There is no mass, hemorrhage or extra-axial collection. There is generalized atrophy without lobar predilection. Areas of hypoattenuation of the deep gray nuclei and confluent periventricular white matter hypodensity, consistent with chronic small vessel disease. Vascular: Atherosclerotic calcification of the internal carotid arteries at the skull base. No abnormal hyperdensity of the major intracranial arteries or dural venous sinuses. Skull: The visualized skull base, calvarium and extracranial soft tissues are normal. Sinuses/Orbits: Diffuse moderate paranasal sinus opacification. Findings of prior bilateral maxillary antrostomies. The orbits are normal. CT CERVICAL SPINE FINDINGS Alignment: Grade 1 anterolisthesis at C4-5 due to facet hypertrophy. Alignment is otherwise normal. Skull base and vertebrae: No acute fracture. Soft tissues and spinal canal: No prevertebral fluid or swelling. No visible canal hematoma. Disc levels: Multilevel facet hypertrophy with neural foraminal stenosis that is worst at right C3-4, right C4-5. Upper chest: No pneumothorax, pulmonary nodule or pleural effusion. Other:  Normal visualized paraspinal cervical soft tissues. IMPRESSION: 1. Generalized atrophy and chronic small vessel ischemia without acute intracranial abnormality. 2. No acute abnormality of the cervical spine. 3. Multilevel facet arthrosis contributing to grade 1 C4-5 anterolisthesis and severe foraminal stenosis on the right at C3-4 and C4-5. Electronically Signed   By: Ulyses Jarred M.D.   On: 12/23/2017 23:14   Ct Cervical Spine Wo  Contrast  Result Date: 12/23/2017 CLINICAL DATA:  Posttraumatic headache. Syncope. Fall with head trauma. EXAM: CT HEAD WITHOUT CONTRAST CT CERVICAL SPINE WITHOUT CONTRAST TECHNIQUE: Multidetector CT imaging of the head and cervical spine was performed following the standard protocol without intravenous contrast. Multiplanar CT image reconstructions of the cervical spine were also generated. COMPARISON:  Head CT 10/24/2016 FINDINGS: CT HEAD FINDINGS Brain: There is no mass, hemorrhage or extra-axial collection. There is generalized atrophy without lobar predilection. Areas of hypoattenuation of the deep gray nuclei and confluent periventricular white matter hypodensity, consistent with chronic small vessel disease. Vascular: Atherosclerotic calcification of the internal carotid arteries at the skull base. No abnormal hyperdensity of the major intracranial arteries or dural venous sinuses. Skull: The visualized skull base, calvarium and extracranial soft tissues are normal. Sinuses/Orbits: Diffuse moderate paranasal sinus opacification. Findings of prior bilateral maxillary antrostomies. The orbits are normal. CT CERVICAL SPINE FINDINGS Alignment: Grade 1 anterolisthesis at C4-5 due to facet hypertrophy. Alignment is otherwise normal. Skull base and vertebrae: No acute fracture. Soft tissues and spinal canal: No prevertebral fluid or swelling. No visible canal hematoma. Disc levels: Multilevel facet hypertrophy with neural foraminal stenosis that is worst at right C3-4, right C4-5. Upper chest: No pneumothorax, pulmonary nodule or pleural effusion. Other: Normal visualized paraspinal cervical soft tissues. IMPRESSION: 1. Generalized atrophy and chronic small vessel ischemia without acute intracranial abnormality. 2. No acute abnormality of the cervical spine. 3. Multilevel facet arthrosis contributing to grade 1 C4-5 anterolisthesis and severe foraminal stenosis on the right at C3-4 and C4-5. Electronically Signed    By: Ulyses Jarred M.D.   On: 12/23/2017 23:14       Assessment / Plan:     #1 84 yo WM with recurrent Diverticular bleed in setting of chronic Plavix , admitted 11/7 .  No bleeding -almost 24 hours  HGB stable - has not required transfusion   Last Colon 1 yr ago - pan diverticulosis - no plan for repeat Colon   Will advance diet to low residue  Continue serial HGb's  ambulate wit help per pt request   Continue to hold Plavix - restart on D/c  With hx recurrent CVA's   Possibly d/c tomorrow  If rebleeds -needs bleeding scan , if + CT Angio /embolization        Contact  Aman Batley, P.A.-C               [phone]      Principal Problem:   Acute lower GI bleeding Active Problems:   Diabetes mellitus type 2, diet-controlled ([place])   History of CVA (cerebrovascular accident)   Leukocytosis   Benign essential HTN   PAF (paroxysmal atrial fibrillation) (HCC)   Syncope due to orthostatic hypotension   Chronic combined systolic and diastolic CHF (congestive heart failure) (HCC)   Acute blood loss anemia     LOS: 0 days   Zakya Halabi  12/25/2017, 9:04 AM

## 2017-12-25 NOTE — Progress Notes (Addendum)
[facility] PROGRESS NOTE  [name] WNI:[id number] DOB: September 16, 1931 DOA: 12/23/2017 PCP: Seward Carol, MD  Assessment/Plan:  1.Acute GI bleed -Patient has hx of recurrent GIB's related to diverticulosis and currently taking plavix for afib. No further bleeding.Hgb is 11.4. He remains hemodyanamically stable. He is tolerating clear liquids. Evaluated by gi who recommend advancing diet and capsule study if another episode. Considering colonoscopy as well.  -monitor intake and output -serial cbc -appreciate gi assistance -holding plavix until ok with GI  2. Acute blood loss anemia. See above -will transfuse if Hg 8 or below -monitor  3. Syncope -Became acutely lightheaded upon standing and suffered transient LOC yesterday.  quickly recovering. No chest discomfort or palpitations associated with this, sinus rhythm on EKG, could not tolerate orthostatic vitals in ED d/t severe lightheadedness with sitting up. CT head with no acute abnormality  -received IV fluids.   -SBP 130 -hold anti-hypertensive home meds  4.Type II DM -A1c was 6.2% in January 2018 -serum glucose 100 this am -Managed with metformin at home, held on admission -Check CBG's and use a low-intensity SSI with Novolog as needed while in hospital  5.History of CVA -Denies any focal deficits -Plavix held in light of acute GIB, statin held while NPO  6.Chronic combined systolic and diastolic CHF -remains compensated - follow daily wt and I/O's -home BB on hold  7.Paroxysmal atrial fibrillation -In sinus rhythm on admission -CHADS-VASc is at least 49 (age x2, CVA x2, DM, CHF) -Not anticoagulated d/t recurrent GIB  - Plavix held for acute bleed  8. Leukocytosis  - WBC is 17,600 on admission without fever.  -resolved - Appears to be some chronic leukocytosis  9. Hypokalemia. Likely related to bleeding and npo status -replete -recheck  Code Status:  dnr Family Communication: wife at bedside Disposition Plan: home hopefully tomorrow   Consultants:  Ardis Hughs gi    HPI/Subjective: Feeling better. No further BRBPR. Tolerating clear liquids. Reports feeling hungry  Admitted 2 days ago with recurring diverticular bleed in setting of plavix.   Objective: Vitals:   12/25/17 0751 12/25/17 0753  BP: (!) 119/58   Pulse: 75   Resp: 18   Temp:    SpO2: 96% 96%    Intake/Output Summary (Last 24 hours) at 12/25/2017 1316 Last data filed at 12/25/2017 1100 Gross per 24 hour  Intake 960 ml  Output 2515 ml  Net -1555 ml   Filed Weights   12/23/17 2030 12/24/17 2158  Weight: 72.6 kg 72.6 kg    Exam:   General:  Sitting in chair smiling and watching tv  Cardiovascular: rrr no mgr no le edema  Respiratory: normal effort BS clear bilaterally no wheeze no rhonchi  Abdomen: non-distended soft +BS no guarding or rebounding  Musculoskeletal: joints without swelling/erythema   Data Reviewed: Basic Metabolic Panel: Recent Labs  Lab 12/23/17 2050 12/24/17 0159 12/25/17 0446  NA 134* 137 139  K 3.9 3.7 3.3*  CL 104 107 108  CO2 23 22 26   GLUCOSE 138* 153* 100*  BUN 20 25* 12  CREATININE 0.75 0.76 0.73  CALCIUM 7.2* 8.0* 7.9*   Liver Function Tests: Recent Labs  Lab 12/23/17 2050  AST 14*  ALT 11  ALKPHOS 44  BILITOT 0.5  PROT 5.1*  ALBUMIN 2.7*   No results for input(s): LIPASE, AMYLASE in the last 168 hours. No results for input(s): AMMONIA in the last 168 hours. CBC: Recent Labs  Lab 12/23/17 2050 12/24/17 0159 12/24/17 0922 12/24/17 1630 12/25/17  9326 12/25/17 1006  WBC 17.6*  --   --   --  10.5  --   NEUTROABS  --   --   --   --  4.9  --   HGB 12.6* 11.5* 10.5* 10.6* 10.3* 11.4*  HCT 39.8 37.0* 34.2* 33.8* 32.3* 35.6*  MCV 99.3  --   --   --  98.8  --   PLT 288  --   --   --  281  --    Cardiac Enzymes: No results for input(s): CKTOTAL, CKMB, CKMBINDEX, TROPONINI in the last 168 hours. BNP  (last 3 results) No results for input(s): BNP in the last 8760 hours.  ProBNP (last 3 results) No results for input(s): PROBNP in the last 8760 hours.  CBG: Recent Labs  Lab 12/24/17 2047 12/25/17 0003 12/25/17 0442 12/25/17 0752 12/25/17 1205  GLUCAP 122* 94 97 98 153*    No results found for this or any previous visit (from the past 240 hour(s)).   Studies: Ct Head Wo Contrast  Result Date: 12/23/2017 CLINICAL DATA:  Posttraumatic headache. Syncope. Fall with head trauma. EXAM: CT HEAD WITHOUT CONTRAST CT CERVICAL SPINE WITHOUT CONTRAST TECHNIQUE: Multidetector CT imaging of the head and cervical spine was performed following the standard protocol without intravenous contrast. Multiplanar CT image reconstructions of the cervical spine were also generated. COMPARISON:  Head CT 10/24/2016 FINDINGS: CT HEAD FINDINGS Brain: There is no mass, hemorrhage or extra-axial collection. There is generalized atrophy without lobar predilection. Areas of hypoattenuation of the deep gray nuclei and confluent periventricular white matter hypodensity, consistent with chronic small vessel disease. Vascular: Atherosclerotic calcification of the internal carotid arteries at the skull base. No abnormal hyperdensity of the major intracranial arteries or dural venous sinuses. Skull: The visualized skull base, calvarium and extracranial soft tissues are normal. Sinuses/Orbits: Diffuse moderate paranasal sinus opacification. Findings of prior bilateral maxillary antrostomies. The orbits are normal. CT CERVICAL SPINE FINDINGS Alignment: Grade 1 anterolisthesis at C4-5 due to facet hypertrophy. Alignment is otherwise normal. Skull base and vertebrae: No acute fracture. Soft tissues and spinal canal: No prevertebral fluid or swelling. No visible canal hematoma. Disc levels: Multilevel facet hypertrophy with neural foraminal stenosis that is worst at right C3-4, right C4-5. Upper chest: No pneumothorax, pulmonary nodule  or pleural effusion. Other: Normal visualized paraspinal cervical soft tissues. IMPRESSION: 1. Generalized atrophy and chronic small vessel ischemia without acute intracranial abnormality. 2. No acute abnormality of the cervical spine. 3. Multilevel facet arthrosis contributing to grade 1 C4-5 anterolisthesis and severe foraminal stenosis on the right at C3-4 and C4-5. Electronically Signed   By: Ulyses Jarred M.D.   On: 12/23/2017 23:14   Ct Cervical Spine Wo Contrast  Result Date: 12/23/2017 CLINICAL DATA:  Posttraumatic headache. Syncope. Fall with head trauma. EXAM: CT HEAD WITHOUT CONTRAST CT CERVICAL SPINE WITHOUT CONTRAST TECHNIQUE: Multidetector CT imaging of the head and cervical spine was performed following the standard protocol without intravenous contrast. Multiplanar CT image reconstructions of the cervical spine were also generated. COMPARISON:  Head CT 10/24/2016 FINDINGS: CT HEAD FINDINGS Brain: There is no mass, hemorrhage or extra-axial collection. There is generalized atrophy without lobar predilection. Areas of hypoattenuation of the deep gray nuclei and confluent periventricular white matter hypodensity, consistent with chronic small vessel disease. Vascular: Atherosclerotic calcification of the internal carotid arteries at the skull base. No abnormal hyperdensity of the major intracranial arteries or dural venous sinuses. Skull: The visualized skull base, calvarium and extracranial soft tissues are  normal. Sinuses/Orbits: Diffuse moderate paranasal sinus opacification. Findings of prior bilateral maxillary antrostomies. The orbits are normal. CT CERVICAL SPINE FINDINGS Alignment: Grade 1 anterolisthesis at C4-5 due to facet hypertrophy. Alignment is otherwise normal. Skull base and vertebrae: No acute fracture. Soft tissues and spinal canal: No prevertebral fluid or swelling. No visible canal hematoma. Disc levels: Multilevel facet hypertrophy with neural foraminal stenosis that is worst  at right C3-4, right C4-5. Upper chest: No pneumothorax, pulmonary nodule or pleural effusion. Other: Normal visualized paraspinal cervical soft tissues. IMPRESSION: 1. Generalized atrophy and chronic small vessel ischemia without acute intracranial abnormality. 2. No acute abnormality of the cervical spine. 3. Multilevel facet arthrosis contributing to grade 1 C4-5 anterolisthesis and severe foraminal stenosis on the right at C3-4 and C4-5. Electronically Signed   By: Ulyses Jarred M.D.   On: 12/23/2017 23:14    Scheduled Meds: . insulin aspart  0-9 Units Subcutaneous Q4H  . mometasone-formoterol  1 puff Inhalation BID  . sodium chloride flush  3 mL Intravenous Q12H   Continuous Infusions:  Principal Problem:   Acute lower GI bleeding Active Problems:   Acute blood loss anemia   Diabetes mellitus type 2, diet-controlled (H[facility])   History of CVA (cerebrovascular accident)   Benign essential HTN   PAF (paroxysmal atrial fibrillation) ([place])   Syncope due to orthostatic hypotension   Chronic combined systolic and diastolic CHF (congestive heart failure) (HCC)   Leukocytosis   Hypokalemia    Time spent: 35 minutes    [facility]  [facility]  If 7PM-7AM, please contact night-coverage at [email or web address], password [facility] 12/25/2017, 1:16 PM  LOS: 0 days      Patient was seen, examined,treatment plan was discussed with the Advance Practice Provider.  I have personally reviewed the clinical findings, labs, EKG, imaging studies and management of this patient in detail. I have also reviewed the orders written for this patient which were under my direction. I agree with the documentation, as recorded by the Advance Practice Provider.   [name] is a 84 y.o. male here with a diverticular bleed.  Hgb stable.  plavix on hold.  Await GI recommendations for restart of plavix as well as d/c timing In chair, NAD- walking to bathroom with walker and standby assist  Geradine Girt,  DO

## 2017-12-25 NOTE — Evaluation (Signed)
Physical Therapy Evaluation Patient Details Name: [name] MRN: [id number] DOB: January 06, 1932 Today's Date: 12/25/2017   History of Present Illness  Pt presents with GI bleed with h/o previous bleeds, on Plavix, PMH: R CVA, CHF, DM, HTN, GERD. He has experienced dizziness and OH this admission as well.   Clinical Impression  Patient evaluated by Physical Therapy with no further acute PT needs identified. All education has been completed and the patient has no further questions. Pt not orthostatic on eval, see doc flowsheets for BP's, and no dizziness. Discussed mgmt of OH since he has struggled with this several times. Ambulated 500' with RW and supervision. No PT needs at this time. Discussed safety and gradual resumption of activity upon return home.  See below for any follow-up Physical Therapy or equipment needs. PT is signing off. Thank you for this referral.     Follow Up Recommendations No PT follow up    Equipment Recommendations  None recommended by PT    Recommendations for Other Services       Precautions / Restrictions Precautions Precautions: Fall Required Braces or Orthoses: Other Brace/Splint Other Brace/Splint: L AFO Restrictions Weight Bearing Restrictions: No      Mobility  Bed Mobility               General bed mobility comments: received out of bed  Transfers Overall transfer level: Needs assistance Equipment used: Rolling walker (2 wheeled) Transfers: Sit to/from Stand Sit to Stand: Supervision         General transfer comment: supervision for safety due to episodes of dizziness  Ambulation/Gait Ambulation/Gait assistance: Supervision Gait Distance (Feet): 500 Feet Assistive device: Rolling walker (2 wheeled) Gait Pattern/deviations: Step-through pattern;Decreased weight shift to left Gait velocity: decreased Gait velocity interpretation: >2.62 ft/sec, indicative of community ambulatory General Gait Details: pt does a good job keeping  LE's in [employer] even with turns. Mindful of LLE. vc's for posture and using triceps instead of traps with RW.   Stairs            Wheelchair Mobility    Modified Rankin (Stroke Patients Only)       Balance Overall balance assessment: Mild deficits observed, not formally tested                                           Pertinent Vitals/Pain Pain Assessment: No/denies pain    Home Living Family/patient expects to be discharged to:: Private residence Living Arrangements: Spouse/significant other Available Help at Discharge: Family;Available 24 hours/day Type of Home: Independent living facility Home Access: Level entry     Home Layout: One level Home Equipment: Grab bars - tub/shower;Walker - 2 wheels;Walker - 4 wheels;Wheelchair - manual;Hand held shower head;Shower seat Additional Comments: lives at [facility] with his wife    Prior Function Level of Independence: Independent with assistive device(s)         Comments: ambulates with RW, drives golf cart but not car     Hand Dominance   Dominant Hand: Right    Extremity/Trunk Assessment   Upper Extremity Assessment Upper Extremity Assessment: LUE deficits/detail LUE Deficits / Details: weakness at baseline since CVA LUE Coordination: decreased fine motor;decreased gross motor    Lower Extremity Assessment Lower Extremity Assessment: LLE deficits/detail LLE Deficits / Details: weakness at baseline since CVA, lacking df and wears AFO LLE Sensation: decreased proprioception LLE Coordination: decreased  fine motor;decreased gross motor    Cervical / Trunk Assessment Cervical / Trunk Assessment: Kyphotic  Communication   Communication: No difficulties  Cognition Arousal/Alertness: Awake/alert Behavior During Therapy: WFL for tasks assessed/performed Overall Cognitive Status: Within Functional Limits for tasks assessed                                        General  Comments General comments (skin integrity, edema, etc.): see doc flowsheets for orthostatic vitals, pt not orthostatic on eval, no dizziness    Exercises     Assessment/Plan    PT Assessment Patent does not need any further PT services  PT Problem List         PT Treatment Interventions      PT Goals (Current goals can be found in the Care Plan section)  Acute Rehab PT Goals Patient Stated Goal: return home PT Goal Formulation: All assessment and education complete, DC therapy    Frequency     Barriers to discharge        Co-evaluation               AM-PAC PT "6 Clicks" Daily Activity  Outcome Measure Difficulty turning over in bed (including adjusting bedclothes, sheets and blankets)?: None Difficulty moving from lying on back to sitting on the side of the bed? : None Difficulty sitting down on and standing up from a chair with arms (e.g., wheelchair, bedside commode, etc,.)?: None Help needed moving to and from a bed to chair (including a wheelchair)?: None Help needed walking in hospital room?: None Help needed climbing 3-5 steps with a railing? : A Little 6 Click Score: 23    End of Session Equipment Utilized During Treatment: Gait belt Activity Tolerance: Patient tolerated treatment well Patient left: in chair;with call bell/phone within reach;with family/visitor present Nurse Communication: Mobility status PT Visit Diagnosis: Dizziness and giddiness (R42)    Time: 3825-0539 PT Time Calculation (min) (ACUTE ONLY): 27 min   Charges:   PT Evaluation $PT Eval Moderate Complexity: 1 Mod PT Treatments $Gait Training: 8-22 mins        Leighton Roach, [place]  Pager [phone] Office [place] 12/25/2017, 4:10 PM

## 2017-12-26 DIAGNOSIS — I10 Essential (primary) hypertension: Secondary | ICD-10-CM | POA: Diagnosis not present

## 2017-12-26 DIAGNOSIS — I110 Hypertensive heart disease with heart failure: Secondary | ICD-10-CM | POA: Diagnosis not present

## 2017-12-26 DIAGNOSIS — Z8673 Personal history of transient ischemic attack (TIA), and cerebral infarction without residual deficits: Secondary | ICD-10-CM | POA: Diagnosis not present

## 2017-12-26 DIAGNOSIS — K2950 Unspecified chronic gastritis without bleeding: Secondary | ICD-10-CM | POA: Diagnosis not present

## 2017-12-26 DIAGNOSIS — E876 Hypokalemia: Secondary | ICD-10-CM | POA: Diagnosis not present

## 2017-12-26 DIAGNOSIS — D62 Acute posthemorrhagic anemia: Secondary | ICD-10-CM | POA: Diagnosis not present

## 2017-12-26 DIAGNOSIS — E119 Type 2 diabetes mellitus without complications: Secondary | ICD-10-CM | POA: Diagnosis not present

## 2017-12-26 DIAGNOSIS — K922 Gastrointestinal hemorrhage, unspecified: Secondary | ICD-10-CM | POA: Diagnosis not present

## 2017-12-26 DIAGNOSIS — I5042 Chronic combined systolic (congestive) and diastolic (congestive) heart failure: Secondary | ICD-10-CM | POA: Diagnosis not present

## 2017-12-26 DIAGNOSIS — K5790 Diverticulosis of intestine, part unspecified, without perforation or abscess without bleeding: Secondary | ICD-10-CM | POA: Diagnosis not present

## 2017-12-26 DIAGNOSIS — W19XXXA Unspecified fall, initial encounter: Secondary | ICD-10-CM | POA: Diagnosis not present

## 2017-12-26 DIAGNOSIS — K5731 Diverticulosis of large intestine without perforation or abscess with bleeding: Secondary | ICD-10-CM | POA: Diagnosis not present

## 2017-12-26 DIAGNOSIS — I480 Paroxysmal atrial fibrillation: Secondary | ICD-10-CM | POA: Diagnosis not present

## 2017-12-26 LAB — BASIC METABOLIC PANEL
Anion gap: 3 — ABNORMAL LOW (ref 5–15)
BUN: 13 mg/dL (ref 8–23)
CALCIUM: 8.3 mg/dL — AB (ref 8.9–10.3)
CHLORIDE: 108 mmol/L (ref 98–111)
CO2: 28 mmol/L (ref 22–32)
CREATININE: 0.85 mg/dL (ref 0.61–1.24)
GFR calc Af Amer: >60 mL/min (ref >60)
GFR calc non Af Amer: >60 mL/min (ref >60)
Glucose, Bld: 110 mg/dL — ABNORMAL HIGH (ref 70–99)
Potassium: 3.7 mmol/L (ref 3.5–5.1)
Sodium: 139 mmol/L (ref 135–145)

## 2017-12-26 LAB — HEMOGLOBIN AND HEMATOCRIT, BLOOD
HCT: 36.4 % — ABNORMAL LOW (ref 39.0–52.0)
Hemoglobin: 11.8 g/dL — ABNORMAL LOW (ref 13.0–17.0)

## 2017-12-26 LAB — GLUCOSE, CAPILLARY
Glucose-Capillary: 108 mg/dL — ABNORMAL HIGH (ref 70–99)
Glucose-Capillary: 130 mg/dL — ABNORMAL HIGH (ref 70–99)

## 2017-12-26 MED ORDER — CLOPIDOGREL BISULFATE 75 MG PO TABS: ORAL_TABLET | ORAL | Status: DC

## 2017-12-26 NOTE — Discharge Summary (Signed)
Physician Discharge Summary  DEMONTA WOMBLES YPP:[id number] DOB: 01-Apr-1931 DOA: 12/23/2017  PCP: Seward Carol, MD  Admit date: 12/23/2017 Discharge date: 12/26/2017  Time spent: 45 minutes  Recommendations for Outpatient Follow-up:  1. Follow up with [facility] gastroenterology for Hg check Tuesday or Wednesday 2. Resume plavix tomorrow 3. folllow up with GI as scheduled 4. Follow up with PCP 1-2 weeks for evaluation of BP control   Discharge Diagnoses:  Principal Problem:   Acute lower GI bleeding Active Problems:   Acute blood loss anemia   Diabetes mellitus type 2, diet-controlled (HCC)   History of CVA (cerebrovascular accident)   Benign essential HTN   PAF (paroxysmal atrial fibrillation) ([place])   Syncope due to orthostatic hypotension   Chronic combined systolic and diastolic CHF (congestive heart failure) (HCC)   Leukocytosis   Hypokalemia   Diverticulosis   Discharge Condition: stable  Diet recommendation: heart healthy/carb modified  Filed Weights   12/23/17 2030 12/24/17 2158 12/25/17 2114  Weight: 72.6 kg 72.6 kg 72.7 kg    History of present illness:  Presented with dark stools and syncope. Hx pan diverticulosis and is taking plavix for afib/stroke. Hg stable. No further episodes gi on board   Hospital Course:   1.Acute GI bleed -Patient has hx of recurrent GIB'srelated to diverticulosis and currently taking plavix for afib. No further bleeding.Hgb is 11.4. He remained hemodyanamically stable. Evaluated by gi who recommend close monitoring and capsule study if another episode. No further epiesdes and Hg stable. May discharge per GI. Resume plavix tomorrow. Hg monitoring tues or wed  2. Acute blood loss anemia. See above -will transfuse if Hg 8 or below -monitor  3. Syncope -Became acutely lightheaded upon standing and suffered transient LOC yesterday.  quickly recovered. Nochest discomfort or palpitations associated with this, sinus rhythm on  EKG, could not tolerate orthostatic vitals in ED d/t severe lightheadedness with sitting up. CT head with no acute abnormality -received IV fluids.   -SBP 116 at discharge -monitor  4.Type II DM -A1c was 6.2% in January 2018 -serum glucose 100.Managed with metformin at home, held on admission -resume at discharge   5.History of CVA -No focal deficits.Plavix held in light of acute GIB, statin held while NPO. Resumed at discharge  6.Chronic combined systolic and diastolic CHF -remained compensatedand weight stable at discharge.  7.Paroxysmal atrial fibrillation -In sinus rhythm on admission -CHADS-VASc is at least 40 (age x2, CVA x2, DM, CHF) -Not anticoagulated d/t recurrent GIB  - Plavix held for acute bleed but may resume 02/26/17 per GI  8. Leukocytosis  - WBC is 17,600 on admission without fever.resolved at discharge  9. Hypokalemia. Likely related to bleeding and npo status -repleted and resolved at discharge  Procedures:    Consultations:  [facility]  Discharge Exam: Vitals:   12/26/17 0744 12/26/17 0757  BP: 116/71   Pulse: 77   Resp: 18   Temp: (!) 97.4 F (36.3 C)   SpO2: 96% 98%    General: well nourished in no acute distress Cardiovascular: rrr no mgr no LE edema Respiratory: normal effort BS clear bilaterally no wheeze  Discharge Instructions   Discharge Instructions    Call MD for:  difficulty breathing, headache or visual disturbances   Complete by:  As directed    Call MD for:  extreme fatigue   Complete by:  As directed    Call MD for:  persistant dizziness or light-headedness   Complete by:  As directed  Diet - low sodium heart healthy   Complete by:  As directed    Discharge instructions   Complete by:  As directed    Call [facility] on 12/27/17 to schedule labs for tues or Wednesday. Will need Hemaglobin checked Resume Plavix 12/27/17   Increase activity slowly   Complete by:   As directed      Allergies as of 12/26/2017      Reactions   Azithromycin Rash, Other (See Comments)   Possible reaction to Z-Pak      Medication List    TAKE these medications   acetaminophen 500 MG tablet Commonly known as:  TYLENOL Take 1,000 mg by mouth every 6 (six) hours as needed for headache (pain).   baclofen 10 MG tablet Commonly known as:  LIORESAL Take 0.5 tablets (5 mg total) by mouth at bedtime as needed for muscle spasms. What changed:  how much to take   carvedilol 3.125 MG tablet Commonly known as:  COREG Take 1 tablet (3.125 mg total) by mouth 2 (two) times daily with a meal.   cetirizine 10 MG tablet Commonly known as:  ZYRTEC Take 10 mg by mouth at bedtime.   clopidogrel 75 MG tablet Commonly known as:  PLAVIX Resume 12/27/17 What changed:    how much to take  how to take this  when to take this  additional instructions   fluticasone 50 MCG/ACT nasal spray Commonly known as:  FLONASE Place 1 spray into both nostrils every morning.   guaiFENesin 600 MG 12 hr tablet Commonly known as:  MUCINEX Take 600 mg by mouth daily as needed for cough or to loosen phlegm.   Melatonin 1 MG Tabs Take 5 mg by mouth at bedtime as needed (sleep).   metFORMIN 500 MG tablet Commonly known as:  GLUCOPHAGE Take 1,000 mg by mouth daily with breakfast.   mometasone-formoterol 100-5 MCG/ACT Aero Commonly known as:  DULERA Inhale 2 puffs into the lungs 2 (two) times daily. What changed:  how much to take   montelukast 10 MG tablet Commonly known as:  SINGULAIR Take 1 tablet (10 mg total) by mouth at bedtime.   PROAIR HFA 108 (90 Base) MCG/ACT inhaler Generic drug:  albuterol Inhale 2 puffs into the lungs every 6 (six) hours as needed for wheezing or shortness of breath.   psyllium 58.6 % powder Commonly known as:  METAMUCIL Take 1 packet by mouth every morning.   rOPINIRole 0.25 MG tablet Commonly known as:  REQUIP Take 1 tablet (0.25 mg total)  by mouth at bedtime. What changed:  how much to take   simvastatin 40 MG tablet Commonly known as:  ZOCOR Take 1 tablet (40 mg total) by mouth daily. What changed:  when to take this   zolpidem 10 MG tablet Commonly known as:  AMBIEN Take 5-10 mg by mouth at bedtime as needed for sleep.      Allergies  Allergen Reactions  . Azithromycin Rash and Other (See Comments)    Possible reaction to Z-Pak      The results of significant diagnostics from this hospitalization (including imaging, microbiology, ancillary and laboratory) are listed below for reference.    Significant Diagnostic Studies: Ct Head Wo Contrast  Result Date: 12/23/2017 CLINICAL DATA:  Posttraumatic headache. Syncope. Fall with head trauma. EXAM: CT HEAD WITHOUT CONTRAST CT CERVICAL SPINE WITHOUT CONTRAST TECHNIQUE: Multidetector CT imaging of the head and cervical spine was performed following the standard protocol without intravenous contrast. Multiplanar CT image  reconstructions of the cervical spine were also generated. COMPARISON:  Head CT 10/24/2016 FINDINGS: CT HEAD FINDINGS Brain: There is no mass, hemorrhage or extra-axial collection. There is generalized atrophy without lobar predilection. Areas of hypoattenuation of the deep gray nuclei and confluent periventricular white matter hypodensity, consistent with chronic small vessel disease. Vascular: Atherosclerotic calcification of the internal carotid arteries at the skull base. No abnormal hyperdensity of the major intracranial arteries or dural venous sinuses. Skull: The visualized skull base, calvarium and extracranial soft tissues are normal. Sinuses/Orbits: Diffuse moderate paranasal sinus opacification. Findings of prior bilateral maxillary antrostomies. The orbits are normal. CT CERVICAL SPINE FINDINGS Alignment: Grade 1 anterolisthesis at C4-5 due to facet hypertrophy. Alignment is otherwise normal. Skull base and vertebrae: No acute fracture. Soft tissues and  spinal canal: No prevertebral fluid or swelling. No visible canal hematoma. Disc levels: Multilevel facet hypertrophy with neural foraminal stenosis that is worst at right C3-4, right C4-5. Upper chest: No pneumothorax, pulmonary nodule or pleural effusion. Other: Normal visualized paraspinal cervical soft tissues. IMPRESSION: 1. Generalized atrophy and chronic small vessel ischemia without acute intracranial abnormality. 2. No acute abnormality of the cervical spine. 3. Multilevel facet arthrosis contributing to grade 1 C4-5 anterolisthesis and severe foraminal stenosis on the right at C3-4 and C4-5. Electronically Signed   By: Ulyses Jarred M.D.   On: 12/23/2017 23:14   Ct Cervical Spine Wo Contrast  Result Date: 12/23/2017 CLINICAL DATA:  Posttraumatic headache. Syncope. Fall with head trauma. EXAM: CT HEAD WITHOUT CONTRAST CT CERVICAL SPINE WITHOUT CONTRAST TECHNIQUE: Multidetector CT imaging of the head and cervical spine was performed following the standard protocol without intravenous contrast. Multiplanar CT image reconstructions of the cervical spine were also generated. COMPARISON:  Head CT 10/24/2016 FINDINGS: CT HEAD FINDINGS Brain: There is no mass, hemorrhage or extra-axial collection. There is generalized atrophy without lobar predilection. Areas of hypoattenuation of the deep gray nuclei and confluent periventricular white matter hypodensity, consistent with chronic small vessel disease. Vascular: Atherosclerotic calcification of the internal carotid arteries at the skull base. No abnormal hyperdensity of the major intracranial arteries or dural venous sinuses. Skull: The visualized skull base, calvarium and extracranial soft tissues are normal. Sinuses/Orbits: Diffuse moderate paranasal sinus opacification. Findings of prior bilateral maxillary antrostomies. The orbits are normal. CT CERVICAL SPINE FINDINGS Alignment: Grade 1 anterolisthesis at C4-5 due to facet hypertrophy. Alignment is  otherwise normal. Skull base and vertebrae: No acute fracture. Soft tissues and spinal canal: No prevertebral fluid or swelling. No visible canal hematoma. Disc levels: Multilevel facet hypertrophy with neural foraminal stenosis that is worst at right C3-4, right C4-5. Upper chest: No pneumothorax, pulmonary nodule or pleural effusion. Other: Normal visualized paraspinal cervical soft tissues. IMPRESSION: 1. Generalized atrophy and chronic small vessel ischemia without acute intracranial abnormality. 2. No acute abnormality of the cervical spine. 3. Multilevel facet arthrosis contributing to grade 1 C4-5 anterolisthesis and severe foraminal stenosis on the right at C3-4 and C4-5. Electronically Signed   By: Ulyses Jarred M.D.   On: 12/23/2017 23:14    Microbiology: No results found for this or any previous visit (from the past 240 hour(s)).   Labs: Basic Metabolic Panel: Recent Labs  Lab 12/23/17 2050 12/24/17 0159 12/25/17 0446 12/26/17 0551  NA 134* 137 139 139  K 3.9 3.7 3.3* 3.7  CL 104 107 108 108  CO2 23 22 26 28   GLUCOSE 138* 153* 100* 110*  BUN 20 25* 12 13  CREATININE 0.75 0.76 0.73 0.85  CALCIUM 7.2* 8.0* 7.9* 8.3*   Liver Function Tests: Recent Labs  Lab 12/23/17 2050  AST 14*  ALT 11  ALKPHOS 44  BILITOT 0.5  PROT 5.1*  ALBUMIN 2.7*   No results for input(s): LIPASE, AMYLASE in the last 168 hours. No results for input(s): AMMONIA in the last 168 hours. CBC: Recent Labs  Lab 12/23/17 2050  12/24/17 1630 12/25/17 0446 12/25/17 1006 12/25/17 2155 12/26/17 0918  WBC 17.6*  --   --  10.5  --   --   --   NEUTROABS  --   --   --  4.9  --   --   --   HGB 12.6*   < > 10.6* 10.3* 11.4* 10.3* 11.8*  HCT 39.8   < > 33.8* 32.3* 35.6* 32.2* 36.4*  MCV 99.3  --   --  98.8  --   --   --   PLT 288  --   --  281  --   --   --    < > = values in this interval not displayed.   Cardiac Enzymes: No results for input(s): CKTOTAL, CKMB, CKMBINDEX, TROPONINI in the last 168  hours. BNP: BNP (last 3 results) No results for input(s): BNP in the last 8760 hours.  ProBNP (last 3 results) No results for input(s): PROBNP in the last 8760 hours.  CBG: Recent Labs  Lab 12/25/17 1205 12/25/17 1604 12/25/17 2002 12/25/17 2227 12/26/17 0747  GLUCAP 153* 98 158* 129* 108*       Signed:  Radene Gunning NP [facility] 12/26/2017, 11:28 AM

## 2017-12-26 NOTE — Plan of Care (Signed)

## 2017-12-26 NOTE — Progress Notes (Signed)
Patient ID: [name], male   DOB: April 04, 1931, 84 y.o.   MRN: [id number]     Progress Note   Subjective    Up in chair, feels fine - walked a lot yesterday  Had BM x one this - I viewed  While in room - black stool with re tinged water  HGB last pm 10.3 -  Up to 11.8 this am   Objective   Vital signs in last 24 hours: Temp:  [97.4 F (36.3 C)-98.4 F (36.9 C)] 97.4 F (36.3 C) (11/10 0744) Pulse Rate:  [76-91] 77 (11/10 0744) Resp:  [16-18] 18 (11/10 0744) BP: (107-132)/(70-91) 116/71 (11/10 0744) SpO2:  [95 %-99 %] 98 % (11/10 0757) Weight:  [72.7 kg] 72.7 kg (11/09 2114) Last BM Date: 12/25/17 General:    [name] in NAD Heart:  Regular rate and rhythm; no murmurs Lungs: Respirations even and unlabored, lungs CTA bilaterally Abdomen:  Soft, nontender and nondistended. Normal bowel sounds. Extremities:  Without edema. Neurologic:  Alert and oriented,  grossly normal neurologically. Psych:  Cooperative. Normal mood and affect.  Intake/Output from previous day: 11/09 0701 - 11/10 0700 In: 1080 [P.O.:1080] Out: 1300 [Urine:1300] Intake/Output this shift: No intake/output data recorded.  Lab Results: Recent Labs    12/23/17 2050  12/25/17 0446 12/25/17 1006 12/25/17 2155  WBC 17.6*  --  10.5  --   --   HGB 12.6*   < > 10.3* 11.4* 10.3*  HCT 39.8   < > 32.3* 35.6* 32.2*  PLT 288  --  281  --   --    < > = values in this interval not displayed.   BMET Recent Labs    12/24/17 0159 12/25/17 0446 12/26/17 0551  NA 137 139 139  K 3.7 3.3* 3.7  CL 107 108 108  CO2 22 26 28   GLUCOSE 153* 100* 110*  BUN 25* 12 13  CREATININE 0.76 0.73 0.85  CALCIUM 8.0* 7.9* 8.3*   LFT Recent Labs    12/23/17 2050  PROT 5.1*  ALBUMIN 2.7*  AST 14*  ALT 11  ALKPHOS 44  BILITOT 0.5   PT/INR No results for input(s): LABPROT, INR in the last 72 hours.    Assessment / Plan:     #1 84 yo male admitted with recurrent diverticular hemorrhage on 12/23/2017 in setting  of chronic Plavix. He has not required blood transfusion this admission. He is stable and is not had any active bleeding past 2 days.  Dark stool this morning, but hemoglobin is on the rise, up to 11.8- today he is passing old blood from the bowel.  Patient is stable for discharge today.  He and his wife live at [place]. He needs to have repeat hemoglobin on Tuesday or Wednesday, and they can come to our office for labs.  Okay to resume Plavix tomorrow.        Contact  Donevan Biller [place], P.A.-C               [phone]      Principal Problem:   Acute lower GI bleeding Active Problems:   Diabetes mellitus type 2, diet-controlled (HCC)   History of CVA (cerebrovascular accident)   Leukocytosis   Benign essential HTN   PAF (paroxysmal atrial fibrillation) (HCC)   Syncope due to orthostatic hypotension   Chronic combined systolic and diastolic CHF (congestive heart failure) (HCC)   Acute blood loss anemia   Hypokalemia   Diverticulosis  LOS: 0 days   Jinx Gilden  12/26/2017, 9:37 AM

## 2017-12-27 ENCOUNTER — Other Ambulatory Visit: Payer: Self-pay

## 2017-12-27 DIAGNOSIS — Z8719 Personal history of other diseases of the digestive system: Secondary | ICD-10-CM

## 2017-12-28 ENCOUNTER — Other Ambulatory Visit (INDEPENDENT_AMBULATORY_CARE_PROVIDER_SITE_OTHER): Payer: Medicare HMO

## 2017-12-28 DIAGNOSIS — Z8719 Personal history of other diseases of the digestive system: Secondary | ICD-10-CM

## 2017-12-28 LAB — CBC WITH DIFFERENTIAL/PLATELET
BASOS ABS: 0.1 10*3/uL (ref 0.0–0.1)
Basophils Relative: 1 % (ref 0.0–3.0)
Eosinophils Absolute: 1 10*3/uL — ABNORMAL HIGH (ref 0.0–0.7)
Eosinophils Relative: 8.6 % — ABNORMAL HIGH (ref 0.0–5.0)
HEMATOCRIT: 34.8 % — AB (ref 39.0–52.0)
Hemoglobin: 11.4 g/dL — ABNORMAL LOW (ref 13.0–17.0)
LYMPHS PCT: 25.3 % (ref 12.0–46.0)
Lymphs Abs: 3.1 10*3/uL (ref 0.7–4.0)
MCHC: 32.8 g/dL (ref 30.0–36.0)
MCV: 97.5 fl (ref 78.0–100.0)
MONOS PCT: 9.7 % (ref 3.0–12.0)
Monocytes Absolute: 1.2 10*3/uL — ABNORMAL HIGH (ref 0.1–1.0)
Neutro Abs: 6.7 10*3/uL (ref 1.4–7.7)
Neutrophils Relative %: 55.4 % (ref 43.0–77.0)
Platelets: 391 10*3/uL (ref 150.0–400.0)
RBC: 3.57 Mil/uL — AB (ref 4.22–5.81)
RDW: 14.1 % (ref 11.5–15.5)
WBC: 12.1 10*3/uL — ABNORMAL HIGH (ref 4.0–10.5)

## 2017-12-30 DIAGNOSIS — Z66 Do not resuscitate: Secondary | ICD-10-CM | POA: Diagnosis not present

## 2017-12-30 DIAGNOSIS — G459 Transient cerebral ischemic attack, unspecified: Secondary | ICD-10-CM | POA: Diagnosis not present

## 2017-12-30 DIAGNOSIS — K5791 Diverticulosis of intestine, part unspecified, without perforation or abscess with bleeding: Secondary | ICD-10-CM | POA: Diagnosis not present

## 2017-12-30 DIAGNOSIS — R55 Syncope and collapse: Secondary | ICD-10-CM | POA: Diagnosis not present

## 2018-01-12 DIAGNOSIS — R05 Cough: Secondary | ICD-10-CM | POA: Diagnosis not present

## 2018-02-04 ENCOUNTER — Telehealth: Payer: Self-pay | Admitting: Gastroenterology

## 2018-02-04 ENCOUNTER — Emergency Department (HOSPITAL_COMMUNITY): Payer: Medicare HMO

## 2018-02-04 ENCOUNTER — Inpatient Hospital Stay (HOSPITAL_COMMUNITY)
Admission: EM | Admit: 2018-02-04 | Discharge: 2018-02-07 | DRG: 378 | Disposition: A | Discharge status: Home / Self Care | Payer: Medicare HMO | Attending: Internal Medicine | Admitting: Internal Medicine

## 2018-02-04 ENCOUNTER — Other Ambulatory Visit: Payer: Self-pay

## 2018-02-04 ENCOUNTER — Encounter (HOSPITAL_COMMUNITY): Payer: Self-pay | Admitting: Emergency Medicine

## 2018-02-04 DIAGNOSIS — J45909 Unspecified asthma, uncomplicated: Secondary | ICD-10-CM | POA: Diagnosis not present

## 2018-02-04 DIAGNOSIS — Z881 Allergy status to other antibiotic agents status: Secondary | ICD-10-CM | POA: Diagnosis not present

## 2018-02-04 DIAGNOSIS — Z66 Do not resuscitate: Secondary | ICD-10-CM | POA: Diagnosis present

## 2018-02-04 DIAGNOSIS — K921 Melena: Secondary | ICD-10-CM | POA: Diagnosis not present

## 2018-02-04 DIAGNOSIS — K5791 Diverticulosis of intestine, part unspecified, without perforation or abscess with bleeding: Secondary | ICD-10-CM | POA: Diagnosis present

## 2018-02-04 DIAGNOSIS — R55 Syncope and collapse: Secondary | ICD-10-CM | POA: Diagnosis not present

## 2018-02-04 DIAGNOSIS — E785 Hyperlipidemia, unspecified: Secondary | ICD-10-CM | POA: Diagnosis not present

## 2018-02-04 DIAGNOSIS — Z8673 Personal history of transient ischemic attack (TIA), and cerebral infarction without residual deficits: Secondary | ICD-10-CM

## 2018-02-04 DIAGNOSIS — E119 Type 2 diabetes mellitus without complications: Secondary | ICD-10-CM

## 2018-02-04 DIAGNOSIS — K922 Gastrointestinal hemorrhage, unspecified: Secondary | ICD-10-CM | POA: Diagnosis present

## 2018-02-04 DIAGNOSIS — I5042 Chronic combined systolic (congestive) and diastolic (congestive) heart failure: Secondary | ICD-10-CM | POA: Diagnosis present

## 2018-02-04 DIAGNOSIS — Z79899 Other long term (current) drug therapy: Secondary | ICD-10-CM | POA: Diagnosis not present

## 2018-02-04 DIAGNOSIS — Z7984 Long term (current) use of oral hypoglycemic drugs: Secondary | ICD-10-CM | POA: Diagnosis not present

## 2018-02-04 DIAGNOSIS — D62 Acute posthemorrhagic anemia: Secondary | ICD-10-CM | POA: Diagnosis present

## 2018-02-04 DIAGNOSIS — K5733 Diverticulitis of large intestine without perforation or abscess with bleeding: Secondary | ICD-10-CM | POA: Diagnosis not present

## 2018-02-04 DIAGNOSIS — I480 Paroxysmal atrial fibrillation: Secondary | ICD-10-CM | POA: Diagnosis present

## 2018-02-04 DIAGNOSIS — I110 Hypertensive heart disease with heart failure: Secondary | ICD-10-CM | POA: Diagnosis not present

## 2018-02-04 DIAGNOSIS — Z7902 Long term (current) use of antithrombotics/antiplatelets: Secondary | ICD-10-CM

## 2018-02-04 DIAGNOSIS — I10 Essential (primary) hypertension: Secondary | ICD-10-CM | POA: Diagnosis not present

## 2018-02-04 DIAGNOSIS — R42 Dizziness and giddiness: Secondary | ICD-10-CM | POA: Diagnosis not present

## 2018-02-04 DIAGNOSIS — K625 Hemorrhage of anus and rectum: Secondary | ICD-10-CM | POA: Diagnosis not present

## 2018-02-04 LAB — CBC
HEMATOCRIT: 40.9 % (ref 39.0–52.0)
HEMOGLOBIN: 12.3 g/dL — AB (ref 13.0–17.0)
MCH: 29.6 pg (ref 26.0–34.0)
MCHC: 30.1 g/dL (ref 30.0–36.0)
MCV: 98.3 fL (ref 80.0–100.0)
NRBC: 0 % (ref 0.0–0.2)
Platelets: 426 10*3/uL — ABNORMAL HIGH (ref 150–400)
RBC: 4.16 MIL/uL — ABNORMAL LOW (ref 4.22–5.81)
RDW: 13.8 % (ref 11.5–15.5)
WBC: 9.9 10*3/uL (ref 4.0–10.5)

## 2018-02-04 LAB — COMPREHENSIVE METABOLIC PANEL
ALT: 15 U/L (ref 0–44)
AST: 21 U/L (ref 15–41)
Albumin: 3.7 g/dL (ref 3.5–5.0)
Alkaline Phosphatase: 59 U/L (ref 38–126)
Anion gap: 9 (ref 5–15)
BUN: 17 mg/dL (ref 8–23)
CHLORIDE: 104 mmol/L (ref 98–111)
CO2: 24 mmol/L (ref 22–32)
Calcium: 8.8 mg/dL — ABNORMAL LOW (ref 8.9–10.3)
Creatinine, Ser: 0.8 mg/dL (ref 0.61–1.24)
GFR calc Af Amer: >60 mL/min (ref >60)
Glucose, Bld: 141 mg/dL — ABNORMAL HIGH (ref 70–99)
POTASSIUM: 4.5 mmol/L (ref 3.5–5.1)
SODIUM: 137 mmol/L (ref 135–145)
Total Bilirubin: 0.4 mg/dL (ref 0.3–1.2)
Total Protein: 6.9 g/dL (ref 6.5–8.1)

## 2018-02-04 LAB — PROTIME-INR
INR: 1.06
Prothrombin Time: 13.7 seconds (ref 11.4–15.2)

## 2018-02-04 LAB — APTT: APTT: 23 s — AB (ref 24–36)

## 2018-02-04 LAB — HEMOGLOBIN AND HEMATOCRIT, BLOOD
HCT: 32.5 % — ABNORMAL LOW (ref 39.0–52.0)
HEMOGLOBIN: 9.9 g/dL — AB (ref 13.0–17.0)

## 2018-02-04 MED ORDER — LORAZEPAM 2 MG/ML IJ SOLN: 0.5000 mg | Freq: Three times a day (TID) | INTRAMUSCULAR | Status: DC | PRN

## 2018-02-04 MED ORDER — ONDANSETRON HCL 4 MG/2ML IJ SOLN: 4.0000 mg | Freq: Four times a day (QID) | INTRAMUSCULAR | Status: DC | PRN

## 2018-02-04 MED ORDER — SODIUM CHLORIDE 0.9 % IV SOLN
INTRAVENOUS | Status: DC
  Administered 2018-02-05: via INTRAVENOUS

## 2018-02-04 MED ORDER — ONDANSETRON HCL 4 MG/2ML IJ SOLN
4.0000 mg | Freq: Once | INTRAMUSCULAR | Status: AC
  Administered 2018-02-04: 4 mg via INTRAVENOUS

## 2018-02-04 MED ORDER — IOHEXOL 300 MG/ML  SOLN
100.0000 mL | Freq: Once | INTRAMUSCULAR | Status: AC | PRN
  Administered 2018-02-04: 100 mL via INTRAVENOUS

## 2018-02-04 MED ORDER — ONDANSETRON HCL 4 MG PO TABS: 4.0000 mg | ORAL_TABLET | Freq: Four times a day (QID) | ORAL | Status: DC | PRN

## 2018-02-04 MED ORDER — INSULIN ASPART 100 UNIT/ML ~~LOC~~ SOLN
0.0000 [IU] | SUBCUTANEOUS | Status: DC
  Administered 2018-02-05 (×3): 1 [IU] via SUBCUTANEOUS

## 2018-02-04 MED ORDER — MOMETASONE FURO-FORMOTEROL FUM 100-5 MCG/ACT IN AERO
1.0000 | INHALATION_SPRAY | Freq: Two times a day (BID) | RESPIRATORY_TRACT | Status: DC
  Administered 2018-02-05 – 2018-02-07 (×4): 1 via RESPIRATORY_TRACT
  Filled 2018-02-04 (×2): qty 8.8

## 2018-02-04 MED ORDER — MORPHINE SULFATE (PF) 2 MG/ML IV SOLN: 1.0000 mg | INTRAVENOUS | Status: DC | PRN

## 2018-02-04 MED ORDER — SIMVASTATIN 20 MG PO TABS
40.0000 mg | ORAL_TABLET | Freq: Every day | ORAL | Status: DC
  Administered 2018-02-05 – 2018-02-06 (×2): 40 mg via ORAL
  Filled 2018-02-04 (×2): qty 2

## 2018-02-04 MED ORDER — LACTATED RINGERS IV BOLUS
1000.0000 mL | Freq: Once | INTRAVENOUS | Status: AC
  Administered 2018-02-04: 1000 mL via INTRAVENOUS

## 2018-02-04 MED ORDER — SODIUM CHLORIDE 0.9% FLUSH
3.0000 mL | Freq: Two times a day (BID) | INTRAVENOUS | Status: DC
  Administered 2018-02-05 – 2018-02-06 (×4): 3 mL via INTRAVENOUS

## 2018-02-04 MED ORDER — ALBUTEROL SULFATE HFA 108 (90 BASE) MCG/ACT IN AERS: 2.0000 | INHALATION_SPRAY | Freq: Four times a day (QID) | RESPIRATORY_TRACT | Status: DC | PRN

## 2018-02-04 MED ORDER — ACETAMINOPHEN 325 MG PO TABS: 650.0000 mg | ORAL_TABLET | Freq: Four times a day (QID) | ORAL | Status: DC | PRN

## 2018-02-04 MED ORDER — ACETAMINOPHEN 650 MG RE SUPP: 650.0000 mg | Freq: Four times a day (QID) | RECTAL | Status: DC | PRN

## 2018-02-04 MED ORDER — SODIUM CHLORIDE 0.9 % IV SOLN
8.0000 mg/h | INTRAVENOUS | Status: DC
  Filled 2018-02-04 (×2): qty 80

## 2018-02-04 MED ORDER — ZOLPIDEM TARTRATE 5 MG PO TABS
5.0000 mg | ORAL_TABLET | Freq: Every evening | ORAL | Status: DC | PRN
  Administered 2018-02-05 – 2018-02-06 (×3): 5 mg via ORAL
  Filled 2018-02-04 (×3): qty 1

## 2018-02-04 MED ORDER — SODIUM CHLORIDE 0.9 % IV SOLN
80.0000 mg | Freq: Once | INTRAVENOUS | Status: AC
  Administered 2018-02-04: 80 mg via INTRAVENOUS
  Filled 2018-02-04: qty 80

## 2018-02-04 MED ORDER — PANTOPRAZOLE SODIUM 40 MG IV SOLR
40.0000 mg | INTRAVENOUS | Status: DC
  Administered 2018-02-05 – 2018-02-07 (×3): 40 mg via INTRAVENOUS
  Filled 2018-02-04 (×3): qty 40

## 2018-02-04 MED ORDER — ALBUTEROL SULFATE (2.5 MG/3ML) 0.083% IN NEBU: 2.5000 mg | INHALATION_SOLUTION | Freq: Four times a day (QID) | RESPIRATORY_TRACT | Status: DC | PRN

## 2018-02-04 NOTE — Telephone Encounter (Signed)
Pt call to advised that he is having rectal bleeding. And is on his way to the [facility]. He stated that he was instructed by dr. Ardis Hughs to call when it started up again

## 2018-02-04 NOTE — ED Notes (Signed)
Pt had bright red bleeding per rectum that filled a bedpan a little over half.

## 2018-02-04 NOTE — H&P (Signed)
History and Physical    [name] IHK:[id number] DOB: 06-12-1931 DOA: 02/04/2018  PCP: Seward Carol, MD   Patient coming from: Home   Chief Complaint: Rectal bleeding, syncope   HPI: [name] is a 84 y.o. male with medical history significant for type 2 diabetes mellitus, chronic combined systolic and diastolic CHF, history of CVA, recurrent GI bleeding, and paroxysmal atrial fibrillation not anticoagulated due to recurrent GI bleeds, now presenting to the emergency department with rectal bleeding and syncope.  Patient reports some mild nausea without vomiting or abdominal pain.  He had bright red blood per rectum multiple times today.  Became acutely lightheaded and had a syncopal episode, recovering quickly.  Denies hitting his head.  Denies any chest pain or palpitations.  No fevers or chills and no recent cough, rhinorrhea, or dysuria.  ED Course: Upon arrival to the ED, patient is found to be afebrile, saturating well on room air, tachycardic in the 110s, and with stable blood pressure.  EKG features a sinus rhythm with PVC and LAFB.  CT of the abdomen and pelvis is negative for acute findings.  Chemistry panel is unremarkable.  CBC is notable for hemoglobin of 12.3, up from 11.4 last month.  INR is normal.  Type and screen was performed and the patient was given a liter of normal saline and 80 mg IV Protonix.  Gastroenterology was consulted by the ED physician and recommended medical admission for further evaluation and management.  Review of Systems:  All other systems reviewed and apart from HPI, are negative.  Past Medical History:  Diagnosis Date  . Acute CVA (cerebrovascular accident) ([place]) 07/25/2015  . Asthma   . Benign essential HTN   . Cerebral infarction due to embolism of left middle cerebral artery ([place]) 02/03/2014  . Chronic combined systolic and diastolic CHF (congestive heart failure) ([place]) 12/23/2017  . Colitis   . Colon polyps   . Diabetes mellitus type 2,  diet-controlled ([place]) 12/22/2013  . Diverticulosis   . GERD (gastroesophageal reflux disease)   . GI bleed   . Hemorrhoids   . Hiatal hernia   . History of esophageal strciture   . HLD (hyperlipidemia)   . Osteoarthritis   . Proctitis   . Status post dilation of esophageal narrowing   . Stroke [facility])     Past Surgical History:  Procedure Laterality Date  . ABDOMINAL HERNIA REPAIR    . COLONOSCOPY     multiple  . COLONOSCOPY WITH PROPOFOL N/A 12/12/2016   Procedure: COLONOSCOPY WITH PROPOFOL;  Surgeon: Gatha Mayer, MD;  Location: [facility] ENDOSCOPY;  Service: Endoscopy;  Laterality: N/A;  . ESOPHAGOGASTRODUODENOSCOPY     multiple  . INSERTION OF MESH  01/29/2012   Procedure: INSERTION OF MESH;  Surgeon: Gwenyth Ober, MD;  Location: [facility];  Service: General;  Laterality: N/A;  . NISSEN FUNDOPLICATION    . SPLENECTOMY, TOTAL     nontraumatic rupture  . VENTRAL HERNIA REPAIR  01/29/2012    WITH MESH  . VENTRAL HERNIA REPAIR  01/29/2012   Procedure: HERNIA REPAIR VENTRAL ADULT;  Surgeon: Gwenyth Ober, MD;  Location: [facility];  Service: General;  Laterality: N/A;  open recurrent ventral hernia repair with mesh     reports that he has never smoked. He has never used smokeless tobacco. He reports current alcohol use of about 1.0 standard drinks of alcohol per week. He reports that he does not use drugs.  Allergies  Allergen Reactions  .  Tape Other (See Comments)    SKIN IS SENSITIVE; PLEASE USE PAPER TAPE; SKIN BRUISES AND TEARS EASILY!!  . Azithromycin Rash and Other (See Comments)    Possible reaction to Z-Pak    Family History  Problem Relation Age of Onset  . Cancer Father      Prior to Admission medications   Medication Sig Start Date End Date Taking? Authorizing Provider  acetaminophen (TYLENOL) 500 MG tablet Take 1,000 mg by mouth every 6 (six) hours as needed (pain or headaches).    Yes [provider]  albuterol (PROAIR HFA) 108 (90 Base) MCG/ACT inhaler  Inhale 2 puffs into the lungs every 6 (six) hours as needed for wheezing or shortness of breath.   Yes [provider]  baclofen (LIORESAL) 10 MG tablet Take 0.5 tablets (5 mg total) by mouth at bedtime as needed for muscle spasms. Patient taking differently: Take 10 mg by mouth at bedtime.  10/25/16  Yes Debbe Odea, MD  carvedilol (COREG) 3.125 MG tablet Take 1 tablet (3.125 mg total) by mouth 2 (two) times daily with a meal. 02/26/16  Yes Amin, Ankit Chirag, MD  cetirizine (ZYRTEC) 10 MG tablet Take 10 mg by mouth at bedtime as needed for allergies or rhinitis.    Yes [provider]  clopidogrel (PLAVIX) 75 MG tablet Resume 12/27/17 Patient taking differently: Take 75 mg by mouth daily.  12/26/17  Yes Black, Lezlie Octave, NP  fluticasone (FLONASE) 50 MCG/ACT nasal spray Place 1 spray into both nostrils every morning.  06/18/14  Yes [provider]  guaiFENesin (MUCINEX) 600 MG 12 hr tablet Take 600-1,200 mg by mouth daily as needed for cough or to loosen phlegm.    Yes [provider]  Melatonin 5 MG TABS Take 5 mg by mouth at bedtime.   Yes [provider]  metFORMIN (GLUCOPHAGE) 1000 MG tablet Take 1,000 mg by mouth daily with breakfast.   Yes [provider]  mometasone-formoterol (DULERA) 100-5 MCG/ACT AERO Inhale 2 puffs into the lungs 2 (two) times daily. Patient taking differently: Inhale 1 puff into the lungs 2 (two) times daily.  12/12/15  Yes Tanda Rockers, MD  montelukast (SINGULAIR) 10 MG tablet Take 1 tablet (10 mg total) by mouth at bedtime. 10/05/17  Yes Tanda Rockers, MD  psyllium (METAMUCIL) 58.6 % powder Take 1 packet by mouth every morning.    Yes [provider]  rOPINIRole (REQUIP) 0.5 MG tablet Take 0.5 mg by mouth at bedtime.   Yes [provider]  simvastatin (ZOCOR) 40 MG tablet Take 1 tablet (40 mg total) by mouth daily. Patient taking differently: Take 40 mg by mouth at bedtime.  02/26/16  Yes Amin,  Ankit Chirag, MD  zolpidem (AMBIEN) 10 MG tablet Take 5-10 mg by mouth at bedtime as needed for sleep.    Yes [provider]    Physical Exam: Vitals:   02/04/18 2000 02/04/18 2015 02/04/18 2030 02/04/18 2045  BP: 126/73 (!) 125/59 107/60 (!) 110/55  Pulse: 67 71 69 88  Resp: 18 19 18 18   SpO2: 97% 98% 100% 96%  Weight:      Height:        Constitutional: NAD, calm  Eyes: PERTLA, lids and conjunctivae normal ENMT: Mucous membranes are moist. Posterior pharynx clear of any exudate or lesions.   Neck: normal, supple, no masses, no thyromegaly Respiratory: clear to auscultation bilaterally, no wheezing, no crackles. Normal respiratory effort.    Cardiovascular: Rate ~110  and regular. No extremity edema.   Abdomen: No distension, no tenderness, soft. Bowel sounds normal.  Musculoskeletal: no clubbing / cyanosis. No joint deformity upper and lower extremities.    Skin: no significant rashes, lesions, ulcers. Poor turgor. Neurologic: No facial asymmetry. Sensation intact. Moving all extremities.  Psychiatric: Alert and oriented x 3. Very pleasant, cooperative.    Labs on Admission: I have personally reviewed following labs and imaging studies  CBC: Recent Labs  Lab 02/04/18 1742  WBC 9.9  HGB 12.3*  HCT 40.9  MCV 98.3  PLT 993*   Basic Metabolic Panel: Recent Labs  Lab 02/04/18 1742  NA 137  K 4.5  CL 104  CO2 24  GLUCOSE 141*  BUN 17  CREATININE 0.80  CALCIUM 8.8*   GFR: Estimated Creatinine Clearance: 70.6 mL/min (by C-G formula based on SCr of 0.8 mg/dL). Liver Function Tests: Recent Labs  Lab 02/04/18 1742  AST 21  ALT 15  ALKPHOS 59  BILITOT 0.4  PROT 6.9  ALBUMIN 3.7   No results for input(s): LIPASE, AMYLASE in the last 168 hours. No results for input(s): AMMONIA in the last 168 hours. Coagulation Profile: Recent Labs  Lab 02/04/18 1927  INR 1.06   Cardiac Enzymes: No results for input(s): CKTOTAL, CKMB, CKMBINDEX, TROPONINI in  the last 168 hours. BNP (last 3 results) No results for input(s): PROBNP in the last 8760 hours. HbA1C: No results for input(s): HGBA1C in the last 72 hours. CBG: No results for input(s): GLUCAP in the last 168 hours. Lipid Profile: No results for input(s): CHOL, HDL, LDLCALC, TRIG, CHOLHDL, LDLDIRECT in the last 72 hours. Thyroid Function Tests: No results for input(s): TSH, T4TOTAL, FREET4, T3FREE, THYROIDAB in the last 72 hours. Anemia Panel: No results for input(s): VITAMINB12, FOLATE, FERRITIN, TIBC, IRON, RETICCTPCT in the last 72 hours. Urine analysis:    Component Value Date/Time   COLORURINE COLORLESS (A) 12/24/2017 1815   APPEARANCEUR CLEAR 12/24/2017 1815   LABSPEC 1.003 (L) 12/24/2017 1815   PHURINE 7.0 12/24/2017 1815   GLUCOSEU NEGATIVE 12/24/2017 1815   HGBUR NEGATIVE 12/24/2017 1815   BILIRUBINUR NEGATIVE 12/24/2017 1815   KETONESUR NEGATIVE 12/24/2017 1815   PROTEINUR NEGATIVE 12/24/2017 1815   UROBILINOGEN 0.2 12/22/2013 1210   NITRITE NEGATIVE 12/24/2017 1815   LEUKOCYTESUR NEGATIVE 12/24/2017 1815   Sepsis Labs: @LABRCNTIP (procalcitonin:4,lacticidven:4) )No results found for this or any previous visit (from the past 240 hour(s)).   Radiological Exams on Admission: Ct Abdomen Pelvis W Contrast  Result Date: 02/04/2018 CLINICAL DATA:  Rectal bleeding. EXAM: CT ABDOMEN AND PELVIS WITH CONTRAST TECHNIQUE: Multidetector CT imaging of the abdomen and pelvis was performed using the standard protocol following bolus administration of intravenous contrast. CONTRAST:  175mL OMNIPAQUE IOHEXOL 300 MG/ML  SOLN COMPARISON:  10/10/2010 FINDINGS: Lower chest: Bibasilar scarring. Normal heart size without pericardial or pleural effusion. Small to moderate hiatal hernia, slightly increased compared to 2012. Hepatobiliary: Normal liver. Normal gallbladder, without biliary ductal dilatation. Pancreas: Pancreatic atrophy, without duct dilatation or dominant mass. Spleen:  Splenectomy.  embolization coils in the left upper quadrant. Adrenals/Urinary Tract: Normal adrenal glands. Left renal cyst and bilateral too small to characterize renal lesions. Normal urinary bladder. Stomach/Bowel: Normal distal stomach. Scattered colonic diverticula. The cecum is mobile and terminates in the right upper quadrant. Normal small bowel caliber. Vascular/Lymphatic: Aortic and branch vessel atherosclerosis. Right common iliac artery ectasia at 1.5 cm. No abdominopelvic adenopathy. Reproductive: Normal prostate for age. Other: No significant free fluid. Prior ventral abdominal wall  hernia repair. Musculoskeletal: Osteopenia. Lumbosacral spondylosis. Convex right lumbar spine curvature IMPRESSION: 1. No acute process in the abdomen or pelvis. No explanation for patient's symptoms. 2. Slight increase in small to moderate hiatal hernia. 3.  Aortic Atherosclerosis (ICD10-I70.0). 4. Right common iliac artery borderline to mild ectasia. Electronically Signed   By: Abigail Miyamoto M.D.   On: 02/04/2018 20:57    EKG: Independently reviewed. Sinus rhythm, PVC, LAFB.   Assessment/Plan   1. Acute GI bleeding  - Patient has hx of recurrent GIB's and presents with 1 day of bright red rectal bleeding and hematochezia with lightheadedness and syncope    - No abdominal pain or indigestion, but had some nausea earlier  - Likely lower GIB, probably recurrent diverticular bleed   - Hgb is 12.3 on admission, up from 11.4 last month  - He is tachycardic in 110's with stable BP  - GI is consulting and much appreciated  - Type and screen done   - Keep NPO, continue IVF hydration, trend H&H   2. Syncope  - Reports a syncopal episode just prior to admission  - No chest discomfort or palpitations, likely secondary to orthostasis in setting of acute GIB; had syncope during admission for acute GIB last month as well  - Check orthostatic vitals, continue IVF hydration while mindful of CHF history, trend H&H and  transfuse as needed   3. Paroxysmal atrial fibrillation  - In sinus rhythm on admission  - CHADS-VASc is 75 (age x2, CVA x2, CHF, DM)  - Not anticoagulated d/t recurrent GI bleeding  - Plavix and beta-blocker held on admission in setting of acute GIB    4. Chronic combined systolic and diastolic CHF  - Appears compensated, given 1 liter IVF in ED in setting of acute GIB with tachycardia and syncope  - Continue IVF hydration with close monitoring, follow daily wt, hold Coreg initially with acute GIB    5. Type II DM  - A1c was 6.2% in January 2018  - Managed with metformin at home, held on admission  - Follow CBG's and use a low-intensity SSI as needed while in hospital    6. History of CVA  - Hold Plavix initially with acute bleed, continue statin as tolerated     DVT prophylaxis: SCD's  Code Status: DNR  Family Communication: Discussed with patient   Consults called: GI  Admission status: Observation     Vianne Bulls, MD [facility] Pager [phone]  If 7PM-7AM, please contact night-coverage [email or web address] Password [email or web address]  02/04/2018, 10:10 PM

## 2018-02-04 NOTE — ED Notes (Signed)
Pt in CT.

## 2018-02-04 NOTE — Telephone Encounter (Signed)
Noted will make Dr Ardis Hughs aware.

## 2018-02-04 NOTE — ED Triage Notes (Signed)
Pt reports diverticulitis with major rectal bleeding episode that happened today. Pt reports hx of syncopal episodes with rectal bleeding. Pt reports feeling a little faint. Denies any pain. Denies N/V/D. Pt takes Plavix.

## 2018-02-04 NOTE — ED Provider Notes (Signed)
[facility] EMERGENCY DEPARTMENT Provider Note   CSN: [id number] Arrival date & time: 02/04/18  1736     History   Chief Complaint Chief Complaint  Patient presents with  . Diverticulitis  . Rectal Bleeding    HPI [name] is a 84 y.o. male.  The history is provided by the patient.    Patient is a 84 year old male with a past medical history of CVA currently on ASA and Plavix, HTN, CHF, DM, GERD, HDL, and recent diverticular bleed approximately 4 to 5 weeks ago who presents accompanied by his wife for evaluation of 1 episode of bright red blood per rectum that occurred immediately prior to arrival.  Of note patient is accompanied by his wife states he passed out while at home and then while in the waiting room patient reportedly syncopized again for less than a minute.  On initial evaluation by this provider patient is alert and oriented and denies any acute pain or symptoms other than bright red blood per his rectum and feeling mildly lightheaded.  He denies any headache, earache, sore throat, chest pain, cough, shortness of breath, abdominal pain, vomiting  Past Medical History:  Diagnosis Date  . Acute CVA (cerebrovascular accident) ([place]) 07/25/2015  . Asthma   . Benign essential HTN   . Cerebral infarction due to embolism of left middle cerebral artery ([place]) 02/03/2014  . Chronic combined systolic and diastolic CHF (congestive heart failure) ([place]) 12/23/2017  . Colitis   . Colon polyps   . Diabetes mellitus type 2, diet-controlled ([place]) 12/22/2013  . Diverticulosis   . GERD (gastroesophageal reflux disease)   . GI bleed   . Hemorrhoids   . Hiatal hernia   . History of esophageal strciture   . HLD (hyperlipidemia)   . Osteoarthritis   . Proctitis   . Status post dilation of esophageal narrowing   . Stroke [facility])     Patient Active Problem List   Diagnosis Date Noted  . Hypokalemia 12/25/2017  . Diverticulosis   . Acute blood loss anemia  12/24/2017  . Syncope due to orthostatic hypotension 12/23/2017  . Chronic combined systolic and diastolic CHF (congestive heart failure) ([place]) 12/23/2017  . GI bleed 10/25/2016  . BPPV (benign paroxysmal positional vertigo) 10/25/2016  . Lower GI bleed 10/24/2016  . Syncope 10/24/2016  . TIA (transient ischemic attack) 07/22/2016  . Benign essential HTN   . PAF (paroxysmal atrial fibrillation) ([place])   . Stroke-like symptom 02/22/2016  . Leukocytosis 02/22/2016  . Acute lower GI bleeding 02/01/2016  . History of lower GI bleeding Dec 2017 02/01/2016  . Chronic anticoagulation 2/2 history of CVA 01/31/2016  . History of CVA (cerebrovascular accident) 01/31/2016  . Sinusitis, chronic 12/12/2015  . Cough variant asthma 10/31/2015  . Insomnia 07/25/2015  . HLD (hyperlipidemia) 02/03/2014  . Ulnar neuropathy at elbow of right upper extremity 02/03/2014  . Cervical radiculopathy 02/03/2014  . Seizures ([place])   . Diabetes mellitus type II, non insulin dependent ([place]) 12/22/2013  . Recurrent ventral hernia 12/15/2011  . History of esophageal strciture   . Diverticulosis of colon with hemorrhage 06/24/2007    Past Surgical History:  Procedure Laterality Date  . ABDOMINAL HERNIA REPAIR    . COLONOSCOPY     multiple  . COLONOSCOPY WITH PROPOFOL N/A 12/12/2016   Procedure: COLONOSCOPY WITH PROPOFOL;  Surgeon: Gatha Mayer, MD;  Location: [facility] ENDOSCOPY;  Service: Endoscopy;  Laterality: N/A;  . ESOPHAGOGASTRODUODENOSCOPY     multiple  .  INSERTION OF MESH  01/29/2012   Procedure: INSERTION OF MESH;  Surgeon: Gwenyth Ober, MD;  Location: [place];  Service: General;  Laterality: N/A;  . NISSEN FUNDOPLICATION    . SPLENECTOMY, TOTAL     nontraumatic rupture  . VENTRAL HERNIA REPAIR  01/29/2012    WITH MESH  . VENTRAL HERNIA REPAIR  01/29/2012   Procedure: HERNIA REPAIR VENTRAL ADULT;  Surgeon: Gwenyth Ober, MD;  Location: [place];  Service: General;  Laterality: N/A;  open recurrent  ventral hernia repair with mesh        Home Medications    Prior to Admission medications   Medication Sig Start Date End Date Taking? Authorizing Provider  acetaminophen (TYLENOL) 500 MG tablet Take 1,000 mg by mouth every 6 (six) hours as needed (pain or headaches).    Yes [provider]  albuterol (PROAIR HFA) 108 (90 Base) MCG/ACT inhaler Inhale 2 puffs into the lungs every 6 (six) hours as needed for wheezing or shortness of breath.   Yes [provider]  baclofen (LIORESAL) 10 MG tablet Take 0.5 tablets (5 mg total) by mouth at bedtime as needed for muscle spasms. Patient taking differently: Take 10 mg by mouth at bedtime.  10/25/16  Yes Debbe Odea, MD  carvedilol (COREG) 3.125 MG tablet Take 1 tablet (3.125 mg total) by mouth 2 (two) times daily with a meal. 02/26/16  Yes Amin, Ankit Chirag, MD  cetirizine (ZYRTEC) 10 MG tablet Take 10 mg by mouth at bedtime as needed for allergies or rhinitis.    Yes [provider]  clopidogrel (PLAVIX) 75 MG tablet Resume 12/27/17 Patient taking differently: Take 75 mg by mouth daily.  12/26/17  Yes Black, Lezlie Octave, NP  fluticasone (FLONASE) 50 MCG/ACT nasal spray Place 1 spray into both nostrils every morning.  06/18/14  Yes [provider]  guaiFENesin (MUCINEX) 600 MG 12 hr tablet Take 600-1,200 mg by mouth daily as needed for cough or to loosen phlegm.    Yes [provider]  Melatonin 5 MG TABS Take 5 mg by mouth at bedtime.   Yes [provider]  metFORMIN (GLUCOPHAGE) 1000 MG tablet Take 1,000 mg by mouth daily with breakfast.   Yes [provider]  mometasone-formoterol (DULERA) 100-5 MCG/ACT AERO Inhale 2 puffs into the lungs 2 (two) times daily. Patient taking differently: Inhale 1 puff into the lungs 2 (two) times daily.  12/12/15  Yes Tanda Rockers, MD  montelukast (SINGULAIR) 10 MG tablet Take 1 tablet (10 mg total) by mouth at bedtime. 10/05/17  Yes Tanda Rockers, MD    psyllium (METAMUCIL) 58.6 % powder Take 1 packet by mouth every morning.    Yes [provider]  rOPINIRole (REQUIP) 0.5 MG tablet Take 0.5 mg by mouth at bedtime.   Yes [provider]  simvastatin (ZOCOR) 40 MG tablet Take 1 tablet (40 mg total) by mouth daily. Patient taking differently: Take 40 mg by mouth at bedtime.  02/26/16  Yes Amin, Ankit Chirag, MD  zolpidem (AMBIEN) 10 MG tablet Take 5-10 mg by mouth at bedtime as needed for sleep.    Yes [provider]    Family History Family History  Problem Relation Age of Onset  . Cancer Father     Social History Social History   Tobacco Use  . Smoking status: Never Smoker  . Smokeless tobacco: Never Used  Substance Use Topics  . Alcohol use: Yes    Alcohol/week: 1.0  standard drinks    Types: 1 Glasses of wine per week    Comment: daily  . Drug use: No     Allergies   Tape and Azithromycin   Review of Systems Review of Systems  Constitutional: Negative for chills and fever.  HENT: Negative for ear pain and sore throat.   Eyes: Negative for pain and visual disturbance.  Respiratory: Negative for cough and shortness of breath.   Cardiovascular: Negative for chest pain and palpitations.  Gastrointestinal: Positive for blood in stool. Negative for abdominal pain and vomiting.  Genitourinary: Negative for dysuria and hematuria.  Musculoskeletal: Negative for arthralgias and back pain.  Skin: Negative for color change and rash.  Neurological: Positive for syncope and light-headedness. Negative for seizures.  All other systems reviewed and are negative.    Physical Exam Updated Vital Signs BP 108/89 (BP Location: Right Arm)   Pulse 95   Temp 98.1 F (36.7 C) (Oral)   Resp 17   Ht '5\' 10"'  (1.778 m)   Wt 72.6 kg   SpO2 98%   BMI 22.96 kg/m   Physical Exam Vitals signs and nursing note reviewed.  Constitutional:      Appearance: Normal appearance. He is well-developed and normal  weight.  HENT:     Head: Normocephalic and atraumatic.     Right Ear: External ear normal.     Left Ear: External ear normal.  Eyes:     Conjunctiva/sclera: Conjunctivae normal.  Neck:     Musculoskeletal: Neck supple.  Cardiovascular:     Rate and Rhythm: Normal rate and regular rhythm.     Heart sounds: No murmur.  Pulmonary:     Effort: Pulmonary effort is normal. No respiratory distress.     Breath sounds: Normal breath sounds.  Abdominal:     Palpations: Abdomen is soft.     Tenderness: There is no abdominal tenderness.  Skin:    General: Skin is warm and dry.     Coloration: Skin is pale.  Neurological:     Mental Status: He is alert.   Bright red blood is noted in the patient's rectum but does not appear to actively bleeding.  There are no obvious external hemorrhoids or fissures.  Patient has intact rectal tone.   ED Treatments / Results  Labs (all labs ordered are listed, but only abnormal results are displayed) Labs Reviewed  COMPREHENSIVE METABOLIC PANEL - Abnormal; Notable for the following components:      Result Value   Glucose, Bld 141 (*)    Calcium 8.8 (*)    All other components within normal limits  CBC - Abnormal; Notable for the following components:   RBC 4.16 (*)    Hemoglobin 12.3 (*)    Platelets 426 (*)    All other components within normal limits  APTT - Abnormal; Notable for the following components:   aPTT 23 (*)    All other components within normal limits  PROTIME-INR  HEMOGLOBIN AND HEMATOCRIT, BLOOD  BASIC METABOLIC PANEL  HEMOGLOBIN  HEMOGLOBIN  HEMATOCRIT  HEMATOCRIT  TYPE AND SCREEN    EKG EKG Interpretation  Date/Time:  Friday February 04 2018 21:11:32 EST Ventricular Rate:  83 PR Interval:    QRS Duration: 85 QT Interval:  380 QTC Calculation: 447 R Axis:   -49 Text Interpretation:  Sinus rhythm Ventricular premature complex Left anterior fascicular block Abnormal R-wave progression, late transition No significant  change since last tracing Confirmed by Isla Pence [phone]) on 02/04/2018  9:15:04 PM   Radiology Ct Abdomen Pelvis W Contrast  Result Date: 02/04/2018 CLINICAL DATA:  Rectal bleeding. EXAM: CT ABDOMEN AND PELVIS WITH CONTRAST TECHNIQUE: Multidetector CT imaging of the abdomen and pelvis was performed using the standard protocol following bolus administration of intravenous contrast. CONTRAST:  119m OMNIPAQUE IOHEXOL 300 MG/ML  SOLN COMPARISON:  10/10/2010 FINDINGS: Lower chest: Bibasilar scarring. Normal heart size without pericardial or pleural effusion. Small to moderate hiatal hernia, slightly increased compared to 2012. Hepatobiliary: Normal liver. Normal gallbladder, without biliary ductal dilatation. Pancreas: Pancreatic atrophy, without duct dilatation or dominant mass. Spleen: Splenectomy.  embolization coils in the left upper quadrant. Adrenals/Urinary Tract: Normal adrenal glands. Left renal cyst and bilateral too small to characterize renal lesions. Normal urinary bladder. Stomach/Bowel: Normal distal stomach. Scattered colonic diverticula. The cecum is mobile and terminates in the right upper quadrant. Normal small bowel caliber. Vascular/Lymphatic: Aortic and branch vessel atherosclerosis. Right common iliac artery ectasia at 1.5 cm. No abdominopelvic adenopathy. Reproductive: Normal prostate for age. Other: No significant free fluid. Prior ventral abdominal wall hernia repair. Musculoskeletal: Osteopenia. Lumbosacral spondylosis. Convex right lumbar spine curvature IMPRESSION: 1. No acute process in the abdomen or pelvis. No explanation for patient's symptoms. 2. Slight increase in small to moderate hiatal hernia. 3.  Aortic Atherosclerosis (ICD10-I70.0). 4. Right common iliac artery borderline to mild ectasia. Electronically Signed   By: KAbigail MiyamotoM.D.   On: 02/04/2018 20:57    Procedures Procedures (including critical care time)  Medications Ordered in ED Medications   mometasone-formoterol (DULERA) 100-5 MCG/ACT inhaler 1 puff (has no administration in time range)  insulin aspart (novoLOG) injection 0-9 Units (has no administration in time range)  sodium chloride flush (NS) 0.9 % injection 3 mL (has no administration in time range)  0.9 %  sodium chloride infusion (has no administration in time range)  acetaminophen (TYLENOL) tablet 650 mg (has no administration in time range)    Or  acetaminophen (TYLENOL) suppository 650 mg (has no administration in time range)  ondansetron (ZOFRAN) tablet 4 mg (has no administration in time range)    Or  ondansetron (ZOFRAN) injection 4 mg (has no administration in time range)  morphine 2 MG/ML injection 1-3 mg (has no administration in time range)  LORazepam (ATIVAN) injection 0.5 mg (has no administration in time range)  albuterol (PROVENTIL) (2.5 MG/3ML) 0.083% nebulizer solution 2.5 mg (has no administration in time range)  ondansetron (ZOFRAN) injection 4 mg (4 mg Intravenous Given 02/04/18 1859)  lactated ringers bolus 1,000 mL (0 mLs Intravenous Stopped 02/04/18 2108)  pantoprazole (PROTONIX) 80 mg in sodium chloride 0.9 % 100 mL IVPB (80 mg Intravenous New Bag/Given 02/04/18 2108)  iohexol (OMNIPAQUE) 300 MG/ML solution 100 mL (100 mLs Intravenous Contrast Given 02/04/18 2029)     Initial Impression / Assessment and Plan / ED Course  I have reviewed the triage vital signs and the nursing notes.  Pertinent labs & imaging results that were available during my care of the patient were reviewed by me and considered in my medical decision making (see chart for details).    Patient is a 84year old male who presents with a stated history exam for evaluation of several episodes of bright red blood per rectum associated with 2 episodes of syncope today.  On presentation patient is afebrile stable vital signs.  Exam as above remarkable for abdomen is soft and nontender, globally pale skin, bright red blood at the  rectum without active bleeding.  CBC remarkable for WBC 9.9,  hemoglobin 12.3, platelets 426.  CMP shows no significant electrolyte abnormalities, AST/ALT/alk phos WNL.  EKG remarkable for NSR at a rate of 83 with normal intervals and no signs of acute ischemic change.   CT A/P remarkable for "No acute process in the abdomen or pelvis. No explanation for patient's symptoms. Slight increase in small to moderate hiatal hernia. Aortic Atherosclerosis (ICD10-I70.0). Right common iliac artery borderline to mild ectasia."  GI service was consulted.  Please see consultants notes for details regarding evaluation and recommendations.  In brief they recommended no acute gastroenterology interventions and admission to the hospital service.  History exam is most concerning for lower GI bleed.  History exam is not consistent with sepsis, perforated viscus, appendicitis, kidney stone, or other acute intra-abdominal pathology.  Patient admitted in stable condition. Final Clinical Impressions(s) / ED Diagnoses   Final diagnoses:  Hematochezia    ED Discharge Orders    None       Hulan Saas, MD 02/04/18 5844    Isla Pence, MD 02/05/18 2354

## 2018-02-04 NOTE — Progress Notes (Signed)
350 cc  BRBPR  collected.  Recent  Hgb of 9.9.  Provider notified.

## 2018-02-04 NOTE — ED Notes (Signed)
Attempted report x1. 

## 2018-02-05 DIAGNOSIS — K922 Gastrointestinal hemorrhage, unspecified: Secondary | ICD-10-CM | POA: Diagnosis not present

## 2018-02-05 DIAGNOSIS — Z79899 Other long term (current) drug therapy: Secondary | ICD-10-CM | POA: Diagnosis not present

## 2018-02-05 DIAGNOSIS — Z7902 Long term (current) use of antithrombotics/antiplatelets: Secondary | ICD-10-CM | POA: Diagnosis not present

## 2018-02-05 DIAGNOSIS — I110 Hypertensive heart disease with heart failure: Secondary | ICD-10-CM | POA: Diagnosis not present

## 2018-02-05 DIAGNOSIS — K5791 Diverticulosis of intestine, part unspecified, without perforation or abscess with bleeding: Secondary | ICD-10-CM | POA: Diagnosis present

## 2018-02-05 DIAGNOSIS — I5042 Chronic combined systolic (congestive) and diastolic (congestive) heart failure: Secondary | ICD-10-CM | POA: Diagnosis not present

## 2018-02-05 DIAGNOSIS — Z66 Do not resuscitate: Secondary | ICD-10-CM | POA: Diagnosis not present

## 2018-02-05 DIAGNOSIS — E785 Hyperlipidemia, unspecified: Secondary | ICD-10-CM | POA: Diagnosis not present

## 2018-02-05 DIAGNOSIS — K5733 Diverticulitis of large intestine without perforation or abscess with bleeding: Secondary | ICD-10-CM | POA: Diagnosis not present

## 2018-02-05 DIAGNOSIS — E119 Type 2 diabetes mellitus without complications: Secondary | ICD-10-CM | POA: Diagnosis not present

## 2018-02-05 DIAGNOSIS — Z881 Allergy status to other antibiotic agents status: Secondary | ICD-10-CM | POA: Diagnosis not present

## 2018-02-05 DIAGNOSIS — J45909 Unspecified asthma, uncomplicated: Secondary | ICD-10-CM | POA: Diagnosis not present

## 2018-02-05 DIAGNOSIS — Z8673 Personal history of transient ischemic attack (TIA), and cerebral infarction without residual deficits: Secondary | ICD-10-CM | POA: Diagnosis not present

## 2018-02-05 DIAGNOSIS — Z7984 Long term (current) use of oral hypoglycemic drugs: Secondary | ICD-10-CM | POA: Diagnosis not present

## 2018-02-05 DIAGNOSIS — D62 Acute posthemorrhagic anemia: Secondary | ICD-10-CM | POA: Diagnosis not present

## 2018-02-05 DIAGNOSIS — I480 Paroxysmal atrial fibrillation: Secondary | ICD-10-CM | POA: Diagnosis not present

## 2018-02-05 DIAGNOSIS — K921 Melena: Secondary | ICD-10-CM | POA: Diagnosis not present

## 2018-02-05 LAB — HEMOGLOBIN
HEMOGLOBIN: 8.1 g/dL — AB (ref 13.0–17.0)
Hemoglobin: 8.7 g/dL — ABNORMAL LOW (ref 13.0–17.0)

## 2018-02-05 LAB — BASIC METABOLIC PANEL
Anion gap: 6 (ref 5–15)
Anion gap: 5 (ref 5–15)
BUN: 22 mg/dL (ref 8–23)
BUN: 29 mg/dL — ABNORMAL HIGH (ref 8–23)
CO2: 25 mmol/L (ref 22–32)
CO2: 23 mmol/L (ref 22–32)
Calcium: 7.7 mg/dL — ABNORMAL LOW (ref 8.9–10.3)
Calcium: 7.3 mg/dL — ABNORMAL LOW (ref 8.9–10.3)
Chloride: 107 mmol/L (ref 98–111)
Chloride: 108 mmol/L (ref 98–111)
Creatinine, Ser: 0.83 mg/dL (ref 0.61–1.24)
Creatinine, Ser: 0.98 mg/dL (ref 0.61–1.24)
GFR calc Af Amer: >60 mL/min (ref >60)
GFR calc Af Amer: >60 mL/min (ref >60)
GFR calc non Af Amer: >60 mL/min (ref >60)
GFR calc non Af Amer: >60 mL/min (ref >60)
GLUCOSE: 198 mg/dL — AB (ref 70–99)
Glucose, Bld: 138 mg/dL — ABNORMAL HIGH (ref 70–99)
Potassium: 4.5 mmol/L (ref 3.5–5.1)
Potassium: 4 mmol/L (ref 3.5–5.1)
Sodium: 138 mmol/L (ref 135–145)
Sodium: 136 mmol/L (ref 135–145)

## 2018-02-05 LAB — PREPARE RBC (CROSSMATCH)

## 2018-02-05 LAB — GLUCOSE, CAPILLARY
GLUCOSE-CAPILLARY: 121 mg/dL — AB (ref 70–99)
Glucose-Capillary: 141 mg/dL — ABNORMAL HIGH (ref 70–99)
Glucose-Capillary: 118 mg/dL — ABNORMAL HIGH (ref 70–99)
Glucose-Capillary: 112 mg/dL — ABNORMAL HIGH (ref 70–99)
Glucose-Capillary: 129 mg/dL — ABNORMAL HIGH (ref 70–99)
Glucose-Capillary: 93 mg/dL (ref 70–99)

## 2018-02-05 LAB — HEMOGLOBIN AND HEMATOCRIT, BLOOD
HCT: 26.2 % — ABNORMAL LOW (ref 39.0–52.0)
HCT: 25.5 % — ABNORMAL LOW (ref 39.0–52.0)
Hemoglobin: 8.2 g/dL — ABNORMAL LOW (ref 13.0–17.0)
Hemoglobin: 8.4 g/dL — ABNORMAL LOW (ref 13.0–17.0)

## 2018-02-05 LAB — HEMATOCRIT
HCT: 26.6 % — ABNORMAL LOW (ref 39.0–52.0)
HCT: 26.6 % — ABNORMAL LOW (ref 39.0–52.0)

## 2018-02-05 MED ORDER — SODIUM CHLORIDE 0.9 % IV BOLUS
500.0000 mL | Freq: Once | INTRAVENOUS | Status: AC
  Administered 2018-02-05: 500 mL via INTRAVENOUS

## 2018-02-05 MED ORDER — SIMVASTATIN 20 MG PO TABS: 40.0000 mg | ORAL_TABLET | Freq: Every day | ORAL | Status: DC

## 2018-02-05 MED ORDER — ROPINIROLE HCL 1 MG PO TABS
0.5000 mg | ORAL_TABLET | Freq: Every day | ORAL | Status: DC
  Administered 2018-02-05 – 2018-02-06 (×2): 0.5 mg via ORAL
  Filled 2018-02-05 (×2): qty 1

## 2018-02-05 MED ORDER — MIRTAZAPINE 15 MG PO TABS
15.0000 mg | ORAL_TABLET | Freq: Every day | ORAL | Status: DC
  Administered 2018-02-05 – 2018-02-06 (×2): 15 mg via ORAL
  Filled 2018-02-05 (×2): qty 1

## 2018-02-05 MED ORDER — SODIUM CHLORIDE 0.9% IV SOLUTION: Freq: Once | INTRAVENOUS | Status: DC

## 2018-02-05 MED ORDER — SODIUM CHLORIDE 0.9% IV SOLUTION
Freq: Once | INTRAVENOUS | Status: AC
  Administered 2018-02-05: 06:00:00 via INTRAVENOUS

## 2018-02-05 MED ORDER — LACTATED RINGERS IV SOLN
INTRAVENOUS | Status: DC
  Administered 2018-02-05: 15:00:00 via INTRAVENOUS

## 2018-02-05 MED ORDER — INSULIN ASPART 100 UNIT/ML ~~LOC~~ SOLN: 0.0000 [IU] | Freq: Three times a day (TID) | SUBCUTANEOUS | Status: DC

## 2018-02-05 NOTE — Progress Notes (Signed)
Patient with 3  Large  frequent bloody stools  this shift. Drop in Bp to 72/57 and orders obtained to infuse Normal saline Bolus 500cc

## 2018-02-05 NOTE — Plan of Care (Signed)
  Problem: Clinical Measurements: Goal: Complications related to the disease process, condition or treatment will be avoided or minimized Outcome: Progressing   Problem: Bowel/Gastric: Goal: Will show no signs and symptoms of gastrointestinal bleeding Outcome: Progressing   Problem: Education: Goal: Ability to identify signs and symptoms of gastrointestinal bleeding will improve Outcome: Progressing

## 2018-02-05 NOTE — Progress Notes (Signed)
PROGRESS NOTE                                                                                                                                                                                                             Patient Demographics:    [name], is a 84 y.o. male, DOB - Feb 15, 1932, HCW:[id number]  Admit date - 02/04/2018   Admitting Physician Vianne Bulls, MD  Outpatient Primary MD for the patient is Seward Carol, MD  LOS - 0  Chief Complaint  Patient presents with  . Diverticulitis  . Rectal Bleeding       Brief Narrative  [name] is a 84 y.o. male with medical history significant for type 2 diabetes mellitus, chronic combined systolic and diastolic CHF, history of CVA, recurrent GI bleeding, and paroxysmal atrial fibrillation not anticoagulated due to recurrent GI bleeds, now presenting to the emergency department with rectal bleeding and syncope, was diagnosed with acute lower GI bleed GI was consulted and we were requested to admit the patient.   Subjective:    [name] today has, No headache, No chest pain, No abdominal pain - No Nausea, No new weakness tingling or numbness, No Cough - SOB.  No further blood per rectum.   Assessment  & Plan :    1.  Acute lower GI bleed with symptomatic acute blood loss related anemia.  It is post 2 units of packed RBC on 02/05/2018, currently bleeding seems to have subsided, continue bowel rest with clear liquid diet, monitor H&H, GI has been consulted.  This appears to be a diverticular bleed.  If bleeding continues or reoccurs will order a tagged RBC scan to localize.  May require embolization or surgery if bleeding continues briskly.  Hold any antiplatelet or offending agents.  Was on Plavix prior to admission.  2.  Paroxysmal atrial fibrillation.  Mali vas 2 score of 6.  He was not on anticoagulation due to history of GI bleed, currently was on Plavix which has been held, was on Coreg prior to admission which  will be resumed.  Currently in sinus.  3.  Chronic combined systolic and diastolic CHF.  Last echo in January 2018 shows a EF of 45% and grade 1 diastolic dysfunction.  Currently compensated.  Hydrate with caution, continue Coreg.  Monitor clinically.  4.  Dyslipidemia.  On statin.  5.  History of CVA.  Plavix currently on hold due to #1 above, continue statin for secondary prevention.  6.  DM type II.  Last A1c was 6.2 in January 2018.  Currently on SSI.  PCP to monitor outpt.  CBG (last 3)  Recent Labs    02/05/18 0022 02/05/18 0600 02/05/18 0810  GLUCAP 121* 141* 118*      Family Communication  :  None  Code Status :  Full  Disposition Plan  :  TBD  Consults  :  GI  Procedures  :    CT Abd & pelvis.  Nonacute.  DVT Prophylaxis  :   SCDs    Lab Results  Component Value Date   PLT 426 (H) 02/04/2018    Diet :  Diet Order            Diet clear liquid Room service appropriate? Yes; Fluid consistency: Thin  Diet effective now               Inpatient Medications Scheduled Meds: . sodium chloride   Intravenous Once  . insulin aspart  0-9 Units Subcutaneous Q4H  . mometasone-formoterol  1 puff Inhalation BID  . pantoprazole (PROTONIX) IV  40 mg Intravenous Q24H  . simvastatin  40 mg Oral q1800  . sodium chloride flush  3 mL Intravenous Q12H   Continuous Infusions: PRN Meds:.acetaminophen **OR** acetaminophen, albuterol, morphine injection, ondansetron **OR** ondansetron (ZOFRAN) IV, zolpidem  Antibiotics  :   Anti-infectives (From admission, onward)   None          Objective:   Vitals:   02/05/18 0915 02/05/18 0930 02/05/18 0935 02/05/18 1212  BP: (!) 135/107 (!) 83/57 101/65   Pulse: 94 88 88   Resp: 16 (!) 23 (!) 23   Temp: 98.7 F (37.1 C)  (!) 97.3 F (36.3 C) 98 F (36.7 C)  TempSrc: Oral  Oral Oral  SpO2: 99% 97% 96%   Weight:      Height:        Wt Readings from Last 3 Encounters:  02/04/18 72.6 kg  12/25/17 72.7 kg  11/05/17  71.4 kg     Intake/Output Summary (Last 24 hours) at 02/05/2018 1231 Last data filed at 02/05/2018 0732 Gross per 24 hour  Intake 2441.86 ml  Output 350 ml  Net 2091.86 ml     Physical Exam  Awake Alert, Oriented X 3, No new F.N deficits, Normal affect [place].AT,PERRAL Supple Neck,No JVD, No cervical lymphadenopathy appriciated.  Symmetrical Chest wall movement, Good air movement bilaterally, CTAB RRR,No Gallops,Rubs or new Murmurs, No Parasternal Heave +ve B.Sounds, Abd Soft, No tenderness, No organomegaly appriciated, No rebound - guarding or rigidity. No Cyanosis, Clubbing or edema, No new Rash or bruise      Data Review:    CBC Recent Labs  Lab 02/04/18 1742 02/04/18 2231 02/05/18 0222  WBC 9.9  --   --   HGB 12.3* 9.9* 8.1*  HCT 40.9 32.5* 26.6*  PLT 426*  --   --   MCV 98.3  --   --   MCH 29.6  --   --   MCHC 30.1  --   --   RDW 13.8  --   --     Chemistries  Recent Labs  Lab 02/04/18 1742 02/05/18 0222  NA 137 138  K 4.5 4.5  CL 104 107  CO2 24 25  GLUCOSE 141* 138*  BUN 17 22  CREATININE 0.80 0.83  CALCIUM 8.8* 7.7*  AST 21  --   ALT 15  --   ALKPHOS 59  --   BILITOT  0.4  --    ------------------------------------------------------------------------------------------------------------------ No results for input(s): CHOL, HDL, LDLCALC, TRIG, CHOLHDL, LDLDIRECT in the last 72 hours.  Lab Results  Component Value Date   HGBA1C 6.2 (H) 02/23/2016   ------------------------------------------------------------------------------------------------------------------ No results for input(s): TSH, T4TOTAL, T3FREE, THYROIDAB in the last 72 hours.  Invalid input(s): FREET3 ------------------------------------------------------------------------------------------------------------------ No results for input(s): VITAMINB12, FOLATE, FERRITIN, TIBC, IRON, RETICCTPCT in the last 72 hours.  Coagulation profile Recent Labs  Lab 02/04/18 1927  INR 1.06     No results for input(s): DDIMER in the last 72 hours.  Cardiac Enzymes No results for input(s): CKMB, TROPONINI, MYOGLOBIN in the last 168 hours.  Invalid input(s): CK ------------------------------------------------------------------------------------------------------------------ No results found for: BNP  Micro Results No results found for this or any previous visit (from the past 240 hour(s)).  Radiology Reports Ct Abdomen Pelvis W Contrast  Result Date: 02/04/2018 CLINICAL DATA:  Rectal bleeding. EXAM: CT ABDOMEN AND PELVIS WITH CONTRAST TECHNIQUE: Multidetector CT imaging of the abdomen and pelvis was performed using the standard protocol following bolus administration of intravenous contrast. CONTRAST:  133mL OMNIPAQUE IOHEXOL 300 MG/ML  SOLN COMPARISON:  10/10/2010 FINDINGS: Lower chest: Bibasilar scarring. Normal heart size without pericardial or pleural effusion. Small to moderate hiatal hernia, slightly increased compared to 2012. Hepatobiliary: Normal liver. Normal gallbladder, without biliary ductal dilatation. Pancreas: Pancreatic atrophy, without duct dilatation or dominant mass. Spleen: Splenectomy.  embolization coils in the left upper quadrant. Adrenals/Urinary Tract: Normal adrenal glands. Left renal cyst and bilateral too small to characterize renal lesions. Normal urinary bladder. Stomach/Bowel: Normal distal stomach. Scattered colonic diverticula. The cecum is mobile and terminates in the right upper quadrant. Normal small bowel caliber. Vascular/Lymphatic: Aortic and branch vessel atherosclerosis. Right common iliac artery ectasia at 1.5 cm. No abdominopelvic adenopathy. Reproductive: Normal prostate for age. Other: No significant free fluid. Prior ventral abdominal wall hernia repair. Musculoskeletal: Osteopenia. Lumbosacral spondylosis. Convex right lumbar spine curvature IMPRESSION: 1. No acute process in the abdomen or pelvis. No explanation for patient's symptoms. 2.  Slight increase in small to moderate hiatal hernia. 3.  Aortic Atherosclerosis (ICD10-I70.0). 4. Right common iliac artery borderline to mild ectasia. Electronically Signed   By: Abigail Miyamoto M.D.   On: 02/04/2018 20:57    Time Spent in minutes  30   Lala Lund M.D on 02/05/2018 at 12:31 PM  To page go to [email or web address] - password [facility]

## 2018-02-06 DIAGNOSIS — K921 Melena: Secondary | ICD-10-CM

## 2018-02-06 LAB — GLUCOSE, CAPILLARY
GLUCOSE-CAPILLARY: 102 mg/dL — AB (ref 70–99)
GLUCOSE-CAPILLARY: 88 mg/dL (ref 70–99)
Glucose-Capillary: 96 mg/dL (ref 70–99)
Glucose-Capillary: 101 mg/dL — ABNORMAL HIGH (ref 70–99)
Glucose-Capillary: 106 mg/dL — ABNORMAL HIGH (ref 70–99)
Glucose-Capillary: 109 mg/dL — ABNORMAL HIGH (ref 70–99)

## 2018-02-06 LAB — BASIC METABOLIC PANEL
ANION GAP: 5 (ref 5–15)
BUN: 15 mg/dL (ref 8–23)
CO2: 24 mmol/L (ref 22–32)
Calcium: 8 mg/dL — ABNORMAL LOW (ref 8.9–10.3)
Chloride: 112 mmol/L — ABNORMAL HIGH (ref 98–111)
Creatinine, Ser: 0.77 mg/dL (ref 0.61–1.24)
GFR calc Af Amer: >60 mL/min (ref >60)
GFR calc non Af Amer: >60 mL/min (ref >60)
Glucose, Bld: 106 mg/dL — ABNORMAL HIGH (ref 70–99)
Potassium: 3.6 mmol/L (ref 3.5–5.1)
Sodium: 141 mmol/L (ref 135–145)

## 2018-02-06 LAB — HEMOGLOBIN AND HEMATOCRIT, BLOOD
HCT: 28.6 % — ABNORMAL LOW (ref 39.0–52.0)
HCT: 26.1 % — ABNORMAL LOW (ref 39.0–52.0)
HCT: 25.1 % — ABNORMAL LOW (ref 39.0–52.0)
Hemoglobin: 9.1 g/dL — ABNORMAL LOW (ref 13.0–17.0)
Hemoglobin: 8.3 g/dL — ABNORMAL LOW (ref 13.0–17.0)
Hemoglobin: 8.1 g/dL — ABNORMAL LOW (ref 13.0–17.0)

## 2018-02-06 LAB — CBC
HCT: 25.7 % — ABNORMAL LOW (ref 39.0–52.0)
Hemoglobin: 8.2 g/dL — ABNORMAL LOW (ref 13.0–17.0)
MCH: 29.5 pg (ref 26.0–34.0)
MCHC: 31.9 g/dL (ref 30.0–36.0)
MCV: 92.4 fL (ref 80.0–100.0)
NRBC: 0 % (ref 0.0–0.2)
PLATELETS: 247 10*3/uL (ref 150–400)
RBC: 2.78 MIL/uL — ABNORMAL LOW (ref 4.22–5.81)
RDW: 14.7 % (ref 11.5–15.5)
WBC: 11.8 10*3/uL — ABNORMAL HIGH (ref 4.0–10.5)

## 2018-02-06 NOTE — Consult Note (Signed)
Consultation  Referring Provider:  Dr Candiss Norse [facility] Primary Care Physician:  Seward Carol, MD Primary Gastroenterologist:  Dr.Jacobs  Reason for Consultation:  GI bleed  HPI: [name] is a 84 y.o. male, known to Dr. Ardis Hughs with history of recurrent diverticular bleeding.  Patient was just hospitalized 5 weeks ago with an episode of diverticular hemorrhage.  He is currently being maintained on chronic Plavix for history of CVA in 2017.  His Plavix was held during his last hospitalization, and we held it for couple of days after discharge and they relate that he had a TIA during that interim.  He did not have any imaging, or require any transfusions at the time of the last admission. Current episode of bleeding onset on Friday, 02/04/2018 in the morning.  He had multiple episodes of bloody stool then presented to the emergency room, and had a brief syncopal episode in the ER.  He had last taken Plavix on Friday morning.  Initial hemoglobin was 12.3, and dropped to 8.1 by yesterday morning.  He has been transfused x2 and hemoglobin is 9.1 this morning. Patient says he did not have any bleeding or bowel movements yesterday, this morning he passed 1 dark stool with clots according to the nurses.  He has not had another bowel movement over the past 3 hours.  He says he feels okay, denies any abdominal discomfort.  Last colonoscopy was done in October 2018 while he was admitted with diverticular hemorrhaging.  This showed multiple large and small mouth diverticuli in the left colon and otherwise negative. He had EGD in 2009 he is status post Nissen fundoplication had a benign distal stricture.  Patient has remote history of adenomatous colon polyps, no polyps found at colonoscopy 2009.  CT scan was done through the admitting ER on admission and was unremarkable for any acute process.   Patient's wife was upset this morning we had not seen the patient sooner.  She stated that she  had called our office on the way to the emergency room and also was told by the hospitalist that GI has been notified.     Past Medical History:  Diagnosis Date  . Acute CVA (cerebrovascular accident) ([place]) 07/25/2015  . Asthma   . Benign essential HTN   . Cerebral infarction due to embolism of left middle cerebral artery ([place]) 02/03/2014  . Chronic combined systolic and diastolic CHF (congestive heart failure) ([place]) 12/23/2017  . Colitis   . Colon polyps   . Diabetes mellitus type 2, diet-controlled ([place]) 12/22/2013  . Diverticulosis   . GERD (gastroesophageal reflux disease)   . GI bleed   . Hemorrhoids   . Hiatal hernia   . History of esophageal strciture   . HLD (hyperlipidemia)   . Osteoarthritis   . Proctitis   . Status post dilation of esophageal narrowing   . Stroke [facility])     Past Surgical History:  Procedure Laterality Date  . ABDOMINAL HERNIA REPAIR    . COLONOSCOPY     multiple  . COLONOSCOPY WITH PROPOFOL N/A 12/12/2016   Procedure: COLONOSCOPY WITH PROPOFOL;  Surgeon: Gatha Mayer, MD;  Location: [facility] ENDOSCOPY;  Service: Endoscopy;  Laterality: N/A;  . ESOPHAGOGASTRODUODENOSCOPY     multiple  . INSERTION OF MESH  01/29/2012   Procedure: INSERTION OF MESH;  Surgeon: Gwenyth Ober, MD;  Location: [place];  Service: General;  Laterality: N/A;  . NISSEN FUNDOPLICATION    . SPLENECTOMY, TOTAL  nontraumatic rupture  . VENTRAL HERNIA REPAIR  01/29/2012    WITH MESH  . VENTRAL HERNIA REPAIR  01/29/2012   Procedure: HERNIA REPAIR VENTRAL ADULT;  Surgeon: Gwenyth Ober, MD;  Location: [place];  Service: General;  Laterality: N/A;  open recurrent ventral hernia repair with mesh    Prior to Admission medications   Medication Sig Start Date End Date Taking? Authorizing Provider  acetaminophen (TYLENOL) 500 MG tablet Take 1,000 mg by mouth every 6 (six) hours as needed (pain or headaches).    Yes [provider]  albuterol (PROAIR HFA) 108 (90 Base) MCG/ACT  inhaler Inhale 2 puffs into the lungs every 6 (six) hours as needed for wheezing or shortness of breath.   Yes [provider]  baclofen (LIORESAL) 10 MG tablet Take 0.5 tablets (5 mg total) by mouth at bedtime as needed for muscle spasms. Patient taking differently: Take 10 mg by mouth at bedtime.  10/25/16  Yes Debbe Odea, MD  carvedilol (COREG) 3.125 MG tablet Take 1 tablet (3.125 mg total) by mouth 2 (two) times daily with a meal. 02/26/16  Yes Amin, Ankit Chirag, MD  cetirizine (ZYRTEC) 10 MG tablet Take 10 mg by mouth at bedtime as needed for allergies or rhinitis.    Yes [provider]  clopidogrel (PLAVIX) 75 MG tablet Resume 12/27/17 Patient taking differently: Take 75 mg by mouth daily.  12/26/17  Yes Black, Lezlie Octave, NP  fluticasone (FLONASE) 50 MCG/ACT nasal spray Place 1 spray into both nostrils every morning.  06/18/14  Yes [provider]  guaiFENesin (MUCINEX) 600 MG 12 hr tablet Take 600-1,200 mg by mouth daily as needed for cough or to loosen phlegm.    Yes [provider]  Melatonin 5 MG TABS Take 5 mg by mouth at bedtime.   Yes [provider]  metFORMIN (GLUCOPHAGE) 1000 MG tablet Take 1,000 mg by mouth daily with breakfast.   Yes [provider]  mometasone-formoterol (DULERA) 100-5 MCG/ACT AERO Inhale 2 puffs into the lungs 2 (two) times daily. Patient taking differently: Inhale 1 puff into the lungs 2 (two) times daily.  12/12/15  Yes Tanda Rockers, MD  montelukast (SINGULAIR) 10 MG tablet Take 1 tablet (10 mg total) by mouth at bedtime. 10/05/17  Yes Tanda Rockers, MD  psyllium (METAMUCIL) 58.6 % powder Take 1 packet by mouth every morning.    Yes [provider]  rOPINIRole (REQUIP) 0.5 MG tablet Take 0.5 mg by mouth at bedtime.   Yes [provider]  simvastatin (ZOCOR) 40 MG tablet Take 1 tablet (40 mg total) by mouth daily. Patient taking differently: Take 40 mg by mouth at bedtime.  02/26/16  Yes  Amin, Ankit Chirag, MD  zolpidem (AMBIEN) 10 MG tablet Take 5-10 mg by mouth at bedtime as needed for sleep.    Yes [provider]    Current Facility-Administered Medications  Medication Dose Route Frequency Provider Last Rate Last Dose  . acetaminophen (TYLENOL) tablet 650 mg  650 mg Oral Q6H PRN Opyd, Ilene Qua, MD      . albuterol (PROVENTIL) (2.5 MG/3ML) 0.083% nebulizer solution 2.5 mg  2.5 mg Nebulization Q6H PRN Opyd, Ilene Qua, MD      . insulin aspart (novoLOG) injection 0-9 Units  0-9 Units Subcutaneous TID WC Bodenheimer, Charles A, NP      . mirtazapine (REMERON) tablet 15 mg  15 mg Oral QHS Bodenheimer, Charles A, NP   15 mg at 02/05/18  2124  . mometasone-formoterol (DULERA) 100-5 MCG/ACT inhaler 1 puff  1 puff Inhalation BID Opyd, Ilene Qua, MD   1 puff at 02/06/18 0849  . ondansetron (ZOFRAN) injection 4 mg  4 mg Intravenous Q6H PRN Opyd, Ilene Qua, MD      . pantoprazole (PROTONIX) injection 40 mg  40 mg Intravenous Q24H Opyd, Ilene Qua, MD   40 mg at 02/06/18 0903  . rOPINIRole (REQUIP) tablet 0.5 mg  0.5 mg Oral QHS Bodenheimer, Charles A, NP   0.5 mg at 02/05/18 2123  . simvastatin (ZOCOR) tablet 40 mg  40 mg Oral q1800 Opyd, Ilene Qua, MD   40 mg at 02/05/18 1842  . sodium chloride flush (NS) 0.9 % injection 3 mL  3 mL Intravenous Q12H Opyd, Ilene Qua, MD   3 mL at 02/06/18 0903  . zolpidem (AMBIEN) tablet 5 mg  5 mg Oral QHS PRN Opyd, Ilene Qua, MD   5 mg at 02/05/18 2124    Allergies as of 02/04/2018 - Review Complete 02/04/2018  Allergen Reaction Noted  . Tape Other (See Comments) 02/04/2018  . Azithromycin Rash and Other (See Comments) 03/08/2015    Family History  Problem Relation Age of Onset  . Cancer Father     Social History   Socioeconomic History  . Marital status: Married    Spouse name: [name]  . Number of children: 2  . Years of education: Bachelor's  . Highest education level: Not on file  Occupational History  . Occupation: retired   Scientific laboratory technician  . Financial resource strain: Not on file  . Food insecurity:    Worry: Not on file    Inability: Not on file  . Transportation needs:    Medical: Not on file    Non-medical: Not on file  Tobacco Use  . Smoking status: Never Smoker  . Smokeless tobacco: Never Used  Substance and Sexual Activity  . Alcohol use: Yes    Alcohol/week: 1.0 standard drinks    Types: 1 Glasses of wine per week    Comment: daily  . Drug use: No  . Sexual activity: Not on file  Lifestyle  . Physical activity:    Days per week: Not on file    Minutes per session: Not on file  . Stress: Not on file  Relationships  . Social connections:    Talks on phone: Not on file    Gets together: Not on file    Attends religious service: Not on file    Active member of club or organization: Not on file    Attends meetings of clubs or organizations: Not on file    Relationship status: Not on file  . Intimate partner violence:    Fear of current or ex partner: Not on file    Emotionally abused: Not on file    Physically abused: Not on file    Forced sexual activity: Not on file  Other Topics Concern  . Not on file  Social History Narrative   Patient is married and has 2 children.   Patient is right handed.   Patient has Bachelor's degree.   Patient drinks 2 cups daily.    Review of Systems: Pertinent positive and negative review of systems were noted in the above HPI section.  All other review of systems was otherwise negative.  Physical Exam: Vital signs in last 24 hours: Temp:  [97.5 F (36.4 C)-98.2 F (36.8 C)] 97.5 F (36.4 C) (12/22 0751) Pulse Rate:  [  75-101] 84 (12/22 0659) Resp:  [17-20] 20 (12/22 0659) BP: (115-137)/(61-82) 126/76 (12/22 0659) SpO2:  [96 %-99 %] 96 % (12/22 0659) Last BM Date: 02/06/18 General:   Alert,  Well-developed, thin elderly white male, pleasant and cooperative in NAD Head:  Normocephalic and atraumatic. Eyes:  Sclera clear, no icterus.    Conjunctiva pale Ears:  Normal auditory acuity. Nose:  No deformity, discharge,  or lesions. Mouth:  No deformity or lesions.   Neck:  Supple; no masses or thyromegaly. Lungs:  Clear throughout to auscultation.   No wheezes, crackles, or rhonchi. Heart:  Regular rate and rhythm; no murmurs, clicks, rubs,  or gallops. Abdomen:  Soft,nontender, BS active,nonpalp mass or hsm.   Rectal:  Deferred  Msk:  Symmetrical without gross deformities. . Pulses:  Normal pulses noted. Extremities:  Without clubbing or edema. Neurologic:  Alert and  oriented x4;  grossly normal neurologically. Skin:  Intact without significant lesions or rashes.. Psych:  Alert and cooperative. Normal mood and affect.  Intake/Output from previous day: 12/21 0701 - 12/22 0700 In: 1009.2 [P.O.:120; I.V.:589.2; Blood:300] Out: 775 [Urine:775] Intake/Output this shift: Total I/O In: -  Out: 1100 [Urine:1100]  Lab Results: Recent Labs    02/04/18 1742  02/05/18 2305 02/06/18 0424 02/06/18 1131  WBC 9.9  --   --  11.8*  --   HGB 12.3*   < > 8.4* 8.2* 9.1*  HCT 40.9   < > 25.5* 25.7* 28.6*  PLT 426*  --   --  247  --    < > = values in this interval not displayed.   BMET Recent Labs    02/05/18 0222 02/05/18 1414 02/06/18 0424  NA 138 136 141  K 4.5 4.0 3.6  CL 107 108 112*  CO2 25 23 24   GLUCOSE 138* 198* 106*  BUN 22 29* 15  CREATININE 0.83 0.98 0.77  CALCIUM 7.7* 7.3* 8.0*   LFT Recent Labs    02/04/18 1742  PROT 6.9  ALBUMIN 3.7  AST 21  ALT 15  ALKPHOS 59  BILITOT 0.4   PT/INR Recent Labs    02/04/18 1927  LABPROT 13.7  INR 1.06       IMPRESSION:  #57 84 year old white male with history of recurrent diverticular bleeding several hospital admissions for diverticular hemorrhage.  Patient admitted 02/04/2018 after multiple episodes of bloody stools at home, and brief syncopal episode in the emergency room. Current episode is very consistent with recurrent diverticular hemorrhage.   He dropped his hemoglobin 4 g, has been transfused 2 units of packed RBCs and hemoglobin is stable today.  He has not had any active bleeding since hours of Saturday 12-21 2019. Bleeding is in setting of chronic Plavix use which is complicating the picture.  2 anemia secondary to acute blood loss #3 story of CVA 2017, patient had a TIA while off Plavix during his last bleed 5 weeks ago. #4 history of congestive heart failure with EF of 45 5.  Status post Nissen fundoplication 6.  Status post splenectomy 7.  Adult onset diabetes mellitus  Plan; Full liquid diet today Continue every 6 hours hemoglobins Would transfuse for hemoglobin 8 or less in this patient due to advanced age, syncope with this episode of hemorrhage, and history of CVAs and TIAs.  We will leave off Plavix today.  We need to maintain some sort of anticoagulation.  If he has no further active bleeding today we will resume Plavix tomorrow.  If patient has  further active bleeding over the next 24 hours needs stat bleeding scan and if positive then IR for CT angios and embolization.  Patient's wife asked whether it should be evaluated at a tertiary care center and I assured her that diverticular hemorrhaging was very common and definitely can be managed at our facility. They are both interested in definitive treatment to stop any further episodes of bleeding.  I explained short of hemicolectomy that we will likely continue to 2 more conservatively for recurrent episodes, especially in light of need for chronic antiplatelet therapy. Do not think at his age and with his comorbidities that surgery would be well-tolerated.  Thank you we will follow with you    Amy EsterwoodPA-C  02/06/2018, 12:11 PM

## 2018-02-06 NOTE — Progress Notes (Signed)
PROGRESS NOTE                                                                                                                                                                                                             Patient Demographics:    [name], is a 84 y.o. male, DOB - 1931/02/18, NID:[id number]  Admit date - 02/04/2018   Admitting Physician Vianne Bulls, MD  Outpatient Primary MD for the patient is Seward Carol, MD  LOS - 1  Chief Complaint  Patient presents with  . Diverticulitis  . Rectal Bleeding       Brief Narrative  [name] is a 84 y.o. male with medical history significant for type 2 diabetes mellitus, chronic combined systolic and diastolic CHF, history of CVA, recurrent GI bleeding, and paroxysmal atrial fibrillation not anticoagulated due to recurrent GI bleeds, now presenting to the emergency department with rectal bleeding and syncope, was diagnosed with acute lower GI bleed GI was consulted and we were requested to admit the patient.   Subjective:   Patient in bed, appears comfortable, denies any headache, no fever, no chest pain or pressure, no shortness of breath , no abdominal pain. No focal weakness.  Small bloody bowel movement this morning.    Assessment  & Plan :    1.  Acute lower GI bleed with symptomatic acute blood loss related anemia.  This sounds like diverticular bleed, status post 2 units of packed RBCs on 02/05/2018, H&H for now stable, continue to monitor.  Continue liquid diet.  GI to evaluate.  If there is sign of ongoing bleeding will check into tagged RBC scan. Plavix on hold.  2.  Paroxysmal atrial fibrillation.  Mali vas 2 score of 6.  Not on anticoagulation due to previous GI bleed history, continue Coreg, currently in sinus.  3.  Chronic combined systolic and diastolic CHF.  Last echo in January 2018 shows a EF of 45% and grade 1 diastolic dysfunction.  Currently compensated.  Hydrate with caution, continue Coreg.   Monitor clinically.  4.  Dyslipidemia.  On statin.  5.  History of CVA.  Plavix currently on hold due to #1 above, continue statin for secondary prevention.  6.  DM type II.  Last A1c was 6.2 in January 2018.  Currently on SSI.  PCP to monitor outpt.  CBG (last 3)  Recent Labs    02/06/18 0052 02/06/18 0500 02/06/18 0749  GLUCAP 102* 96 101*  Family Communication  :  None  Code Status :  Full  Disposition Plan  :  TBD  Consults  :  GI  Procedures  :    CT Abd & pelvis.  Nonacute.  DVT Prophylaxis  :   SCDs    Lab Results  Component Value Date   PLT 247 02/06/2018    Diet :  Diet Order            Diet clear liquid Room service appropriate? Yes; Fluid consistency: Thin  Diet effective now               Inpatient Medications Scheduled Meds: . sodium chloride   Intravenous Once  . insulin aspart  0-9 Units Subcutaneous TID WC  . mirtazapine  15 mg Oral QHS  . mometasone-formoterol  1 puff Inhalation BID  . pantoprazole (PROTONIX) IV  40 mg Intravenous Q24H  . rOPINIRole  0.5 mg Oral QHS  . simvastatin  40 mg Oral q1800  . sodium chloride flush  3 mL Intravenous Q12H   Continuous Infusions: . lactated ringers 50 mL/hr at 02/05/18 1512   PRN Meds:.acetaminophen **OR** [DISCONTINUED] acetaminophen, albuterol, morphine injection, ondansetron **OR** ondansetron (ZOFRAN) IV, zolpidem  Antibiotics  :   Anti-infectives (From admission, onward)   None          Objective:   Vitals:   02/06/18 0054 02/06/18 0142 02/06/18 0659 02/06/18 0751  BP:  137/82 126/76   Pulse:  (!) 101 84   Resp:  18 20   Temp: 98 F (36.7 C)  98.2 F (36.8 C) (!) 97.5 F (36.4 C)  TempSrc: Oral  Oral Oral  SpO2:  99% 96%   Weight:      Height:        Wt Readings from Last 3 Encounters:  02/04/18 72.6 kg  12/25/17 72.7 kg  11/05/17 71.4 kg     Intake/Output Summary (Last 24 hours) at 02/06/2018 1110 Last data filed at 02/06/2018 1042 Gross per 24 hour    Intake 709.17 ml  Output 1875 ml  Net -1165.83 ml     Physical Exam  Awake Alert, Oriented X 3, No new F.N deficits, Normal affect [place].AT,PERRAL Supple Neck,No JVD, No cervical lymphadenopathy appriciated.  Symmetrical Chest wall movement, Good air movement bilaterally, CTAB RRR,No Gallops, Rubs or new Murmurs, No Parasternal Heave +ve B.Sounds, Abd Soft, No tenderness, No organomegaly appriciated, No rebound - guarding or rigidity. No Cyanosis, Clubbing or edema, No new Rash or bruise     Data Review:    CBC Recent Labs  Lab 02/04/18 1742  02/05/18 0222 02/05/18 1414 02/05/18 1804 02/05/18 2305 02/06/18 0424  WBC 9.9  --   --   --   --   --  11.8*  HGB 12.3*   < > 8.1* 8.2* 8.7* 8.4* 8.2*  HCT 40.9   < > 26.6* 26.2* 26.6* 25.5* 25.7*  PLT 426*  --   --   --   --   --  247  MCV 98.3  --   --   --   --   --  92.4  MCH 29.6  --   --   --   --   --  29.5  MCHC 30.1  --   --   --   --   --  31.9  RDW 13.8  --   --   --   --   --  14.7   < > =  values in this interval not displayed.    Chemistries  Recent Labs  Lab 02/04/18 1742 02/05/18 0222 02/05/18 1414 02/06/18 0424  NA 137 138 136 141  K 4.5 4.5 4.0 3.6  CL 104 107 108 112*  CO2 24 25 23 24   GLUCOSE 141* 138* 198* 106*  BUN 17 22 29* 15  CREATININE 0.80 0.83 0.98 0.77  CALCIUM 8.8* 7.7* 7.3* 8.0*  AST 21  --   --   --   ALT 15  --   --   --   ALKPHOS 59  --   --   --   BILITOT 0.4  --   --   --    ------------------------------------------------------------------------------------------------------------------ No results for input(s): CHOL, HDL, LDLCALC, TRIG, CHOLHDL, LDLDIRECT in the last 72 hours.  Lab Results  Component Value Date   HGBA1C 6.2 (H) 02/23/2016   ------------------------------------------------------------------------------------------------------------------ No results for input(s): TSH, T4TOTAL, T3FREE, THYROIDAB in the last 72 hours.  Invalid input(s):  FREET3 ------------------------------------------------------------------------------------------------------------------ No results for input(s): VITAMINB12, FOLATE, FERRITIN, TIBC, IRON, RETICCTPCT in the last 72 hours.  Coagulation profile Recent Labs  Lab 02/04/18 1927  INR 1.06    No results for input(s): DDIMER in the last 72 hours.  Cardiac Enzymes No results for input(s): CKMB, TROPONINI, MYOGLOBIN in the last 168 hours.  Invalid input(s): CK ------------------------------------------------------------------------------------------------------------------ No results found for: BNP  Micro Results No results found for this or any previous visit (from the past 240 hour(s)).  Radiology Reports Ct Abdomen Pelvis W Contrast  Result Date: 02/04/2018 CLINICAL DATA:  Rectal bleeding. EXAM: CT ABDOMEN AND PELVIS WITH CONTRAST TECHNIQUE: Multidetector CT imaging of the abdomen and pelvis was performed using the standard protocol following bolus administration of intravenous contrast. CONTRAST:  188mL OMNIPAQUE IOHEXOL 300 MG/ML  SOLN COMPARISON:  10/10/2010 FINDINGS: Lower chest: Bibasilar scarring. Normal heart size without pericardial or pleural effusion. Small to moderate hiatal hernia, slightly increased compared to 2012. Hepatobiliary: Normal liver. Normal gallbladder, without biliary ductal dilatation. Pancreas: Pancreatic atrophy, without duct dilatation or dominant mass. Spleen: Splenectomy.  embolization coils in the left upper quadrant. Adrenals/Urinary Tract: Normal adrenal glands. Left renal cyst and bilateral too small to characterize renal lesions. Normal urinary bladder. Stomach/Bowel: Normal distal stomach. Scattered colonic diverticula. The cecum is mobile and terminates in the right upper quadrant. Normal small bowel caliber. Vascular/Lymphatic: Aortic and branch vessel atherosclerosis. Right common iliac artery ectasia at 1.5 cm. No abdominopelvic adenopathy. Reproductive:  Normal prostate for age. Other: No significant free fluid. Prior ventral abdominal wall hernia repair. Musculoskeletal: Osteopenia. Lumbosacral spondylosis. Convex right lumbar spine curvature IMPRESSION: 1. No acute process in the abdomen or pelvis. No explanation for patient's symptoms. 2. Slight increase in small to moderate hiatal hernia. 3.  Aortic Atherosclerosis (ICD10-I70.0). 4. Right common iliac artery borderline to mild ectasia. Electronically Signed   By: Abigail Miyamoto M.D.   On: 02/04/2018 20:57    Time Spent in minutes  30   Lala Lund M.D on 02/06/2018 at 11:10 AM  To page go to [email or web address] - password [email or web address]

## 2018-02-07 LAB — BASIC METABOLIC PANEL
Anion gap: 5 (ref 5–15)
BUN: 11 mg/dL (ref 8–23)
CO2: 27 mmol/L (ref 22–32)
Calcium: 7.9 mg/dL — ABNORMAL LOW (ref 8.9–10.3)
Chloride: 108 mmol/L (ref 98–111)
Creatinine, Ser: 0.88 mg/dL (ref 0.61–1.24)
GFR calc non Af Amer: >60 mL/min (ref >60)
Glucose, Bld: 102 mg/dL — ABNORMAL HIGH (ref 70–99)
POTASSIUM: 3.5 mmol/L (ref 3.5–5.1)
Sodium: 140 mmol/L (ref 135–145)

## 2018-02-07 LAB — CBC
HCT: 24.3 % — ABNORMAL LOW (ref 39.0–52.0)
Hemoglobin: 8 g/dL — ABNORMAL LOW (ref 13.0–17.0)
MCH: 31 pg (ref 26.0–34.0)
MCHC: 32.9 g/dL (ref 30.0–36.0)
MCV: 94.2 fL (ref 80.0–100.0)
Platelets: 263 10*3/uL (ref 150–400)
RBC: 2.58 MIL/uL — ABNORMAL LOW (ref 4.22–5.81)
RDW: 14.5 % (ref 11.5–15.5)
WBC: 11.3 10*3/uL — ABNORMAL HIGH (ref 4.0–10.5)
nRBC: 0 % (ref 0.0–0.2)

## 2018-02-07 LAB — GLUCOSE, CAPILLARY: Glucose-Capillary: 99 mg/dL (ref 70–99)

## 2018-02-07 MED ORDER — FERROUS SULFATE 325 (65 FE) MG PO TABS: 325.0000 mg | ORAL_TABLET | Freq: Two times a day (BID) | ORAL | 0 refills | Status: DC

## 2018-02-07 MED ORDER — PANTOPRAZOLE SODIUM 40 MG PO TBEC: 40.0000 mg | DELAYED_RELEASE_TABLET | Freq: Every day | ORAL | 0 refills | Status: DC

## 2018-02-07 MED ORDER — CLOPIDOGREL BISULFATE 75 MG PO TABS: 75.0000 mg | ORAL_TABLET | Freq: Every day | ORAL | Status: DC

## 2018-02-07 NOTE — Progress Notes (Signed)
     [facility] Progress Note  CC: I want to go home  Assessment / Plan: Painless hematochezia in the setting of Plavix thought to be a diverticular bleed GI blood loss anemia, now stable Pancolonic diverticulosis noted on prior imaging and endoscopy History of CVA 2017 with a TIA when off Plavix 5 weeks ago History of congestive heart failure with an EF of 45  Suspected recurrent diverticular bleed.  No additional overt bleeding.  Hemoglobin now stable.   Unfortunately, his Plavix is necessary despite recurrent GI bleeding. Agree with plans for discharge to home. Recommend repeat hemoglobin later this week. High fiber diet.   Subjective: Feeling well this morning.  No additional bleeding.  No abdominal pain.  GI review of systems is negative.  The patient would like to go home.  No family present at the bedside.  Objective:  Vital signs in last 24 hours: Temp:  [97.9 F (36.6 C)-98.3 F (36.8 C)] 98.1 F (36.7 C) (12/23 0802) Pulse Rate:  [71-99] 86 (12/23 0400) Resp:  [16-23] 16 (12/23 0400) BP: (105-140)/(65-81) 119/73 (12/23 0400) SpO2:  [95 %-100 %] 96 % (12/23 0400) Weight:  [67.5 kg] 67.5 kg (12/23 0500) Last BM Date: 02/06/18 General:   Alert, in NAD Heart:  Irregularly irregular; no murmurs Pulm: Clear anteriorly; no wheezing Abdomen:  Soft. Nontender. Nondistended. Normal bowel sounds. No rebound or guarding. LAD: No inguinal or umbilical LAD Extremities:  Without edema. Neurologic:  Alert and  oriented x4;  grossly normal neurologically. Psych:  Alert and cooperative. Normal mood and affect.  Lab Results: Recent Labs    02/04/18 1742  02/06/18 0424  02/06/18 1648 02/06/18 2303 02/07/18 0603  WBC 9.9  --  11.8*  --   --   --  11.3*  HGB 12.3*   < > 8.2*   < > 8.3* 8.1* 8.0*  HCT 40.9   < > 25.7*   < > 26.1* 25.1* 24.3*  PLT 426*  --  247  --   --   --  263   < > = values in this interval not displayed.   BMET Recent Labs     02/05/18 1414 02/06/18 0424 02/07/18 0603  NA 136 141 140  K 4.0 3.6 3.5  CL 108 112* 108  CO2 23 24 27   GLUCOSE 198* 106* 102*  BUN 29* 15 11  CREATININE 0.98 0.77 0.88  CALCIUM 7.3* 8.0* 7.9*   LFT Recent Labs    02/04/18 1742  PROT 6.9  ALBUMIN 3.7  AST 21  ALT 15  ALKPHOS 59  BILITOT 0.4   PT/INR Recent Labs    02/04/18 1927  LABPROT 13.7  INR 1.06     LOS: 2 days   [name]  02/07/2018, 8:32 AM

## 2018-02-07 NOTE — Discharge Summary (Signed)
[name] KYH:[id number] DOB: 21-Oct-1931 DOA: 02/04/2018  PCP: Grant Carol, MD  Admit date: 02/04/2018  Discharge date: 02/07/2018  Admitted From: Home   Disposition:  Home   Recommendations for Outpatient Follow-up:   Follow up with PCP in 1-2 weeks  PCP Please obtain BMP/CBC, 2 view CXR in 1week,  (see Discharge instructions)   PCP Please follow up on the following pending results:    Home Health: None   Equipment/Devices: None  Consultations: GI Discharge Condition: Fair   CODE STATUS: Fill   Diet Recommendation: Soft     Chief Complaint  Patient presents with  . Diverticulitis  . Rectal Bleeding     Brief history of present illness from the day of admission and additional interim summary    [name] a 84 y.o.malewith medical history significant fortype 2 diabetes mellitus, chronic combined systolic and diastolic CHF, history of CVA, recurrent GI bleeding, and paroxysmal atrial fibrillation not anticoagulated due to recurrent GI bleeds, now presenting to the emergency department with rectal bleeding and syncope, was diagnosed with acute lower GI bleed GI was consulted and we were requested to admit the patient.                                                                 Hospital Course    1.  Acute lower GI bleed with symptomatic acute blood loss related anemia.  This sounds like diverticular bleed, status post 2 units of packed RBCs on 02/05/2018, his Plavix was on hold and he was placed on bowel rest with liquid diet, his H&H is stable. He had a reddish BM last evening and we wanted to stay another 24 hours however he refuses to do so and wants to spend Christmas and holidays at home and wants to be discharged immediately.  We will discharge him on a soft diet, continue to hold  Plavix for another 3 to 4 days.  Requested him to follow with PCP in 3 days to get repeat CBC check, advised him that if bleeding reoccurs to seek medical attention.  Also outpatient GI follow-up recommended.  PCP to arrange for it.  2.  Paroxysmal atrial fibrillation.  Mali vas 2 score of 6.  Not on anticoagulation due to previous GI bleed history, continue Coreg, currently in sinus.  3.  Chronic combined systolic and diastolic CHF.  Last echo in January 2018 shows a EF of 45% and grade 1 diastolic dysfunction.  Currently compensated.  Hydrate with caution, continue Coreg.  Monitor clinically.  4.  Dyslipidemia.  On statin.  5.  History of CVA.  Plavix currently on hold due to #1 above, continue statin for secondary prevention.  6.  DM type II.  Last A1c was 6.2 in January 2018.  Continue home regimen. PCP  to monitor outpt.     Discharge diagnosis     Principal Problem:   Acute lower GI bleeding Active Problems:   Diabetes mellitus type II, non insulin dependent (HCC)   History of CVA (cerebrovascular accident)   PAF (paroxysmal atrial fibrillation) (HCC)   Syncope   Chronic combined systolic and diastolic CHF (congestive heart failure) ([place])   Hematochezia    Discharge instructions    Discharge Instructions    Discharge instructions   Complete by:  As directed    Follow with Primary MD Grant Carol, MD in 3 days   Get CBC, CMP, 2 view Chest X ray -  checked  by Primary MD or SNF MD in 3 days   Activity: As tolerated with Full fall precautions use walker/cane & assistance as needed  Disposition Home    Diet: Soft   Special Instructions: If you have smoked or chewed Tobacco  in the last 2 yrs please stop smoking, stop any regular Alcohol  and or any Recreational drug use.  On your next visit with your primary care physician please Get Medicines reviewed and adjusted.  Please request your Prim.MD to go over all Hospital Tests and Procedure/Radiological results at  the follow up, please get all Hospital records sent to your Prim MD by signing hospital release before you go home.  If you experience worsening of your admission symptoms, develop shortness of breath, life threatening emergency, suicidal or homicidal thoughts you must seek medical attention immediately by calling 911 or calling your MD immediately  if symptoms less severe.  You Must read complete instructions/literature along with all the possible adverse reactions/side effects for all the Medicines you take and that have been prescribed to you. Take any new Medicines after you have completely understood and accpet all the possible adverse reactions/side effects.   Increase activity slowly   Complete by:  As directed       Discharge Medications   Allergies as of 02/07/2018      Reactions   Tape Other (See Comments)   SKIN IS SENSITIVE; PLEASE USE PAPER TAPE; SKIN BRUISES AND TEARS EASILY!!   Azithromycin Rash, Other (See Comments)   Possible reaction to Z-Pak      Medication List    TAKE these medications   acetaminophen 500 MG tablet Commonly known as:  TYLENOL Take 1,000 mg by mouth every 6 (six) hours as needed (pain or headaches).   baclofen 10 MG tablet Commonly known as:  LIORESAL Take 0.5 tablets (5 mg total) by mouth at bedtime as needed for muscle spasms. What changed:    how much to take  when to take this   carvedilol 3.125 MG tablet Commonly known as:  COREG Take 1 tablet (3.125 mg total) by mouth 2 (two) times daily with a meal.   cetirizine 10 MG tablet Commonly known as:  ZYRTEC Take 10 mg by mouth at bedtime as needed for allergies or rhinitis.   clopidogrel 75 MG tablet Commonly known as:  PLAVIX Take 1 tablet (75 mg total) by mouth daily. Start taking on:  February 10, 2018 What changed:    how much to take  how to take this  when to take this  additional instructions  These instructions start on February 10, 2018. If you are unsure what to  do until then, ask your doctor or other care provider.   ferrous sulfate 325 (65 FE) MG tablet Take 1 tablet (325 mg total) by mouth 2 (two) times  daily with a meal.   fluticasone 50 MCG/ACT nasal spray Commonly known as:  FLONASE Place 1 spray into both nostrils every morning.   guaiFENesin 600 MG 12 hr tablet Commonly known as:  MUCINEX Take 600-1,200 mg by mouth daily as needed for cough or to loosen phlegm.   Melatonin 5 MG Tabs Take 5 mg by mouth at bedtime.   metFORMIN 1000 MG tablet Commonly known as:  GLUCOPHAGE Take 1,000 mg by mouth daily with breakfast.   mometasone-formoterol 100-5 MCG/ACT Aero Commonly known as:  DULERA Inhale 2 puffs into the lungs 2 (two) times daily. What changed:  how much to take   montelukast 10 MG tablet Commonly known as:  SINGULAIR Take 1 tablet (10 mg total) by mouth at bedtime.   pantoprazole 40 MG tablet Commonly known as:  PROTONIX Take 1 tablet (40 mg total) by mouth daily.   PROAIR HFA 108 (90 Base) MCG/ACT inhaler Generic drug:  albuterol Inhale 2 puffs into the lungs every 6 (six) hours as needed for wheezing or shortness of breath.   psyllium 58.6 % powder Commonly known as:  METAMUCIL Take 1 packet by mouth every morning.   rOPINIRole 0.5 MG tablet Commonly known as:  REQUIP Take 0.5 mg by mouth at bedtime.   simvastatin 40 MG tablet Commonly known as:  ZOCOR Take 1 tablet (40 mg total) by mouth daily. What changed:  when to take this   zolpidem 10 MG tablet Commonly known as:  AMBIEN Take 5-10 mg by mouth at bedtime as needed for sleep.       Follow-up Information    Polite, Jori Moll, MD. Schedule an appointment as soon as possible for a visit in 3 day(s).   Specialty:  Internal Medicine Contact information: [address] [place] [address] [phone]        Milus Banister, MD. Schedule an appointment as soon as possible for a visit in 1 week(s).   Specialty:   Gastroenterology Contact information: [address] [place] [address] [phone]           Major procedures and Radiology Reports - PLEASE review detailed and final reports thoroughly  -        Ct Abdomen Pelvis W Contrast  Result Date: 02/04/2018 CLINICAL DATA:  Rectal bleeding. EXAM: CT ABDOMEN AND PELVIS WITH CONTRAST TECHNIQUE: Multidetector CT imaging of the abdomen and pelvis was performed using the standard protocol following bolus administration of intravenous contrast. CONTRAST:  156mL OMNIPAQUE IOHEXOL 300 MG/ML  SOLN COMPARISON:  10/10/2010 FINDINGS: Lower chest: Bibasilar scarring. Normal heart size without pericardial or pleural effusion. Small to moderate hiatal hernia, slightly increased compared to 2012. Hepatobiliary: Normal liver. Normal gallbladder, without biliary ductal dilatation. Pancreas: Pancreatic atrophy, without duct dilatation or dominant mass. Spleen: Splenectomy.  embolization coils in the left upper quadrant. Adrenals/Urinary Tract: Normal adrenal glands. Left renal cyst and bilateral too small to characterize renal lesions. Normal urinary bladder. Stomach/Bowel: Normal distal stomach. Scattered colonic diverticula. The cecum is mobile and terminates in the right upper quadrant. Normal small bowel caliber. Vascular/Lymphatic: Aortic and branch vessel atherosclerosis. Right common iliac artery ectasia at 1.5 cm. No abdominopelvic adenopathy. Reproductive: Normal prostate for age. Other: No significant free fluid. Prior ventral abdominal wall hernia repair. Musculoskeletal: Osteopenia. Lumbosacral spondylosis. Convex right lumbar spine curvature IMPRESSION: 1. No acute process in the abdomen or pelvis. No explanation for patient's symptoms. 2. Slight increase in small to moderate hiatal hernia. 3.  Aortic  Atherosclerosis (ICD10-I70.0). 4. Right common iliac artery borderline to mild ectasia. Electronically Signed   By: Abigail Miyamoto M.D.   On: 02/04/2018  20:57    Micro Results    No results found for this or any previous visit (from the past 240 hour(s)).  Today   Subjective    [name] today has no headache,no chest abdominal pain,no new weakness tingling or numbness, feels much better wants to go home today.    Objective   Blood pressure 119/73, pulse 86, temperature 98.1 F (36.7 C), temperature source Oral, resp. rate 16, height 5\' 10"  (1.778 m), weight 67.5 kg, SpO2 96 %.   Intake/Output Summary (Last 24 hours) at 02/07/2018 0843 Last data filed at 02/07/2018 0408 Gross per 24 hour  Intake -  Output 1300 ml  Net -1300 ml    Exam Awake Alert, Oriented x 3, No new F.N deficits, Normal affect [place].AT,PERRAL Supple Neck,No JVD, No cervical lymphadenopathy appriciated.  Symmetrical Chest wall movement, Good air movement bilaterally, CTAB RRR,No Gallops,Rubs or new Murmurs, No Parasternal Heave +ve B.Sounds, Abd Soft, Non tender, No organomegaly appriciated, No rebound -guarding or rigidity. No Cyanosis, Clubbing or edema, No new Rash or bruise   Data Review   CBC w Diff:  Lab Results  Component Value Date   WBC 11.3 (H) 02/07/2018   HGB 8.0 (L) 02/07/2018   HCT 24.3 (L) 02/07/2018   PLT 263 02/07/2018   LYMPHOPCT 25.3 12/28/2017   BANDSPCT 0 02/22/2016   MONOPCT 9.7 12/28/2017   EOSPCT 8.6 (H) 12/28/2017   BASOPCT 1.0 12/28/2017    CMP:  Lab Results  Component Value Date   NA 140 02/07/2018   K 3.5 02/07/2018   CL 108 02/07/2018   CO2 27 02/07/2018   BUN 11 02/07/2018   CREATININE 0.88 02/07/2018   CREATININE 0.74 02/08/2012   PROT 6.9 02/04/2018   ALBUMIN 3.7 02/04/2018   BILITOT 0.4 02/04/2018   ALKPHOS 59 02/04/2018   AST 21 02/04/2018   ALT 15 02/04/2018  .   Total Time in preparing paper work, data evaluation and todays exam - 61 minutes  Lala Lund M.D on 02/07/2018 at 8:43 AM  [facility]   Office  [phone]

## 2018-02-07 NOTE — Discharge Instructions (Signed)
Follow with Primary MD Seward Carol, MD in 3 days   Get CBC, CMP, 2 view Chest X ray -  checked  by Primary MD or SNF MD in 3 days   Activity: As tolerated with Full fall precautions use walker/cane & assistance as needed  Disposition Home    Diet: Soft   Special Instructions: If you have smoked or chewed Tobacco  in the last 2 yrs please stop smoking, stop any regular Alcohol  and or any Recreational drug use.  On your next visit with your primary care physician please Get Medicines reviewed and adjusted.  Please request your Prim.MD to go over all Hospital Tests and Procedure/Radiological results at the follow up, please get all Hospital records sent to your Prim MD by signing hospital release before you go home.  If you experience worsening of your admission symptoms, develop shortness of breath, life threatening emergency, suicidal or homicidal thoughts you must seek medical attention immediately by calling 911 or calling your MD immediately  if symptoms less severe.  You Must read complete instructions/literature along with all the possible adverse reactions/side effects for all the Medicines you take and that have been prescribed to you. Take any new Medicines after you have completely understood and accpet all the possible adverse reactions/side effects.

## 2018-02-08 LAB — TYPE AND SCREEN
ABO/RH(D): O POS
ANTIBODY SCREEN: NEGATIVE
UNIT DIVISION: 0
Unit division: 0
Unit division: 0
Unit division: 0

## 2018-02-08 LAB — BPAM RBC
BLOOD PRODUCT EXPIRATION DATE: 202001152359
Blood Product Expiration Date: 202001152359
Blood Product Expiration Date: 202001152359
Blood Product Expiration Date: 202001152359
ISSUE DATE / TIME: 201912210512
ISSUE DATE / TIME: 201912210909
Unit Type and Rh: 5100
Unit Type and Rh: 5100
Unit Type and Rh: 5100
Unit Type and Rh: 5100

## 2018-02-15 DIAGNOSIS — K5731 Diverticulosis of large intestine without perforation or abscess with bleeding: Secondary | ICD-10-CM | POA: Diagnosis not present

## 2018-02-22 ENCOUNTER — Ambulatory Visit: Payer: Medicare HMO | Admitting: Gastroenterology

## 2018-03-08 DIAGNOSIS — R69 Illness, unspecified: Secondary | ICD-10-CM | POA: Diagnosis not present

## 2018-03-09 DIAGNOSIS — J45991 Cough variant asthma: Secondary | ICD-10-CM | POA: Diagnosis not present

## 2018-03-09 DIAGNOSIS — H6123 Impacted cerumen, bilateral: Secondary | ICD-10-CM | POA: Diagnosis not present

## 2018-03-15 DIAGNOSIS — M2352 Chronic instability of knee, left knee: Secondary | ICD-10-CM | POA: Diagnosis not present

## 2018-03-15 DIAGNOSIS — M6281 Muscle weakness (generalized): Secondary | ICD-10-CM | POA: Diagnosis not present

## 2018-03-29 ENCOUNTER — Encounter: Payer: Self-pay | Admitting: Gastroenterology

## 2018-03-29 ENCOUNTER — Ambulatory Visit: Payer: Medicare HMO | Admitting: Gastroenterology

## 2018-03-29 ENCOUNTER — Other Ambulatory Visit (INDEPENDENT_AMBULATORY_CARE_PROVIDER_SITE_OTHER): Payer: Medicare HMO

## 2018-03-29 VITALS — BP 148/70 | HR 76 | Ht 70.0 in | Wt 159.0 lb

## 2018-03-29 DIAGNOSIS — Z8719 Personal history of other diseases of the digestive system: Secondary | ICD-10-CM

## 2018-03-29 LAB — CBC WITH DIFFERENTIAL/PLATELET
Basophils Absolute: 0.1 10*3/uL (ref 0.0–0.1)
Basophils Relative: 1.3 % (ref 0.0–3.0)
Eosinophils Absolute: 0.7 10*3/uL (ref 0.0–0.7)
Eosinophils Relative: 7.3 % — ABNORMAL HIGH (ref 0.0–5.0)
HCT: 34.7 % — ABNORMAL LOW (ref 39.0–52.0)
Hemoglobin: 10.7 g/dL — ABNORMAL LOW (ref 13.0–17.0)
LYMPHS ABS: 2.9 10*3/uL (ref 0.7–4.0)
LYMPHS PCT: 29.9 % (ref 12.0–46.0)
MCHC: 30.9 g/dL (ref 30.0–36.0)
MCV: 82.8 fl (ref 78.0–100.0)
MONOS PCT: 10.5 % (ref 3.0–12.0)
Monocytes Absolute: 1 10*3/uL (ref 0.1–1.0)
Neutro Abs: 5 10*3/uL (ref 1.4–7.7)
Neutrophils Relative %: 51 % (ref 43.0–77.0)
Platelets: 481 10*3/uL — ABNORMAL HIGH (ref 150.0–400.0)
RBC: 4.19 Mil/uL — ABNORMAL LOW (ref 4.22–5.81)
RDW: 17.3 % — ABNORMAL HIGH (ref 11.5–15.5)
WBC: 9.8 10*3/uL (ref 4.0–10.5)

## 2018-03-29 NOTE — Patient Instructions (Addendum)
You will have labs checked today in the basement lab.  Please head down after you check out with the front desk  (cbc)  Please return to see Dr. Ardis Hughs as needed.  Thank you for entrusting me with your care and choosing [facility].  Dr Ardis Hughs

## 2018-03-29 NOTE — Progress Notes (Signed)
Review of pertinent gastrointestinal problems:  1. routine risk for colon cancer.Colonoscopy 2003 with Dr. Inocente Salles, polyps removed but none were adenomatous. Colonoscopy June 2009 Dr. Ardis Hughs found no polyps. Significant left sided diverticulosis was noted.  2. esophageal ring, focal stricture dilated 2009with deeper than usual mucosal tear, followup Gastrografin esophagram showed no extravasation  3. status post fundoplication 4. Recurrent GI bleeding, presumed diverticular. 2009 did not require blood transfusion; 2018 he was hospitalized twice for overt red rectal bleeding, presumed diverticular.  Hb nadir 6.8 (normally Hb 14). Colonoscopy 11/2016 Dr. Carlean Purl confirmed multiple left-sided diverticulosis pockets, the prep in the right side was poor.  There was no acute blood in the colon.  He also presumed that his acute  bleeding was diverticular in origin.  Chronic Plavix use probably contributes. 01/2018 another episode Hb nadir 8 (normally Hb 12s). Received two units PRBC.  Repeat CT scan 01/2018 with IV contrast (ordered by ER I believe) showed no clear source. 5. Splenectomy; 2009,ruptured.   HPI: This is a very pleasant 85 year old man whom I last saw several months ago.  He was hospitalized in late December with another overt acute lower GI bleed.  Hemoglobin dropped from around 12 to 8.  He ended up receiving 2 units packed red blood cell transfusion.  He was hemodynamically stable.  He and his wife were quite frustrated with her hospital stay.  They had really hoped that GI would be consulted immediately upon his arrival to the emergency room however that was not the case.  Chief complaint is recurrent lower GI bleeding  ROS: complete GI ROS as described in HPI, all other review negative.  Constitutional:  No unintentional weight loss   Past Medical History:  Diagnosis Date  . Acute CVA (cerebrovascular accident) ([place]) 07/25/2015  . Asthma   . Benign essential HTN   . Cerebral  infarction due to embolism of left middle cerebral artery ([place]) 02/03/2014  . Chronic combined systolic and diastolic CHF (congestive heart failure) ([place]) 12/23/2017  . Colitis   . Colon polyps   . Diabetes mellitus type 2, diet-controlled ([place]) 12/22/2013  . Diverticulosis   . GERD (gastroesophageal reflux disease)   . GI bleed   . Hemorrhoids   . Hiatal hernia   . History of esophageal strciture   . HLD (hyperlipidemia)   . Osteoarthritis   . Proctitis   . Status post dilation of esophageal narrowing   . Stroke [facility])     Past Surgical History:  Procedure Laterality Date  . ABDOMINAL HERNIA REPAIR    . COLONOSCOPY     multiple  . COLONOSCOPY WITH PROPOFOL N/A 12/12/2016   Procedure: COLONOSCOPY WITH PROPOFOL;  Surgeon: Gatha Mayer, MD;  Location[facility] ENDOSCOPY;  Service: Endoscopy;  Laterality: N/A;  . ESOPHAGOGASTRODUODENOSCOPY     multiple  . INSERTION OF MESH  01/29/2012   Procedure: INSERTION OF MESH;  Surgeon: Gwenyth Ober, MD;  Location: [place];  Service: General;  Laterality: N/A;  . NISSEN FUNDOPLICATION    . SPLENECTOMY, TOTAL     nontraumatic rupture  . VENTRAL HERNIA REPAIR  01/29/2012    WITH MESH  . VENTRAL HERNIA REPAIR  01/29/2012   Procedure: HERNIA REPAIR VENTRAL ADULT;  Surgeon: Gwenyth Ober, MD;  Location: [place];  Service: General;  Laterality: N/A;  open recurrent ventral hernia repair with mesh    Current Outpatient Medications  Medication Sig Dispense Refill  . acetaminophen (TYLENOL) 500 MG tablet Take 1,000 mg by mouth  every 6 (six) hours as needed (pain or headaches).     Marland Kitchen albuterol (PROAIR HFA) 108 (90 Base) MCG/ACT inhaler Inhale 2 puffs into the lungs every 6 (six) hours as needed for wheezing or shortness of breath.    . baclofen (LIORESAL) 10 MG tablet Take 0.5 tablets (5 mg total) by mouth at bedtime as needed for muscle spasms. (Patient taking differently: Take 10 mg by mouth at bedtime. ) 30 each 0  . carvedilol (COREG) 3.125 MG tablet  Take 1 tablet (3.125 mg total) by mouth 2 (two) times daily with a meal. 60 tablet 1  . cetirizine (ZYRTEC) 10 MG tablet Take 10 mg by mouth at bedtime as needed for allergies or rhinitis.     Marland Kitchen clopidogrel (PLAVIX) 75 MG tablet Take 1 tablet (75 mg total) by mouth daily.    . ferrous sulfate 325 (65 FE) MG tablet Take 1 tablet (325 mg total) by mouth 2 (two) times daily with a meal. 60 tablet 0  . fluticasone (FLONASE) 50 MCG/ACT nasal spray Place 1 spray into both nostrils every morning.   5  . guaiFENesin (MUCINEX) 600 MG 12 hr tablet Take 600-1,200 mg by mouth daily as needed for cough or to loosen phlegm.     . Melatonin 5 MG TABS Take 5 mg by mouth at bedtime.    . metFORMIN (GLUCOPHAGE) 1000 MG tablet Take 1,000 mg by mouth daily with breakfast.    . mometasone-formoterol (DULERA) 100-5 MCG/ACT AERO Inhale 2 puffs into the lungs 2 (two) times daily. (Patient taking differently: Inhale 1 puff into the lungs 2 (two) times daily. ) 1 Inhaler 0  . montelukast (SINGULAIR) 10 MG tablet Take 1 tablet (10 mg total) by mouth at bedtime. 30 tablet 12  . pantoprazole (PROTONIX) 40 MG tablet Take 1 tablet (40 mg total) by mouth daily. 30 tablet 0  . psyllium (METAMUCIL) 58.6 % powder Take 1 packet by mouth every morning.     Marland Kitchen rOPINIRole (REQUIP) 0.5 MG tablet Take 0.5 mg by mouth at bedtime.    . simvastatin (ZOCOR) 40 MG tablet Take 1 tablet (40 mg total) by mouth daily. (Patient taking differently: Take 40 mg by mouth at bedtime. ) 30 tablet 3  . zolpidem (AMBIEN) 10 MG tablet Take 5-10 mg by mouth at bedtime as needed for sleep.      No current facility-administered medications for this visit.     Allergies as of 03/29/2018 - Review Complete 03/29/2018  Allergen Reaction Noted  . Tape Other (See Comments) 02/04/2018  . Azithromycin Rash and Other (See Comments) 03/08/2015    Family History  Problem Relation Age of Onset  . Cancer Father     Social History   Socioeconomic History  .  Marital status: Married    Spouse name: [name]  . Number of children: 2  . Years of education: Bachelor's  . Highest education level: Not on file  Occupational History  . Occupation: retired  Scientific laboratory technician  . Financial resource strain: Not on file  . Food insecurity:    Worry: Not on file    Inability: Not on file  . Transportation needs:    Medical: Not on file    Non-medical: Not on file  Tobacco Use  . Smoking status: Never Smoker  . Smokeless tobacco: Never Used  Substance and Sexual Activity  . Alcohol use: Yes    Alcohol/week: 1.0 standard drinks    Types: 1 Glasses of wine per week  Comment: daily  . Drug use: No  . Sexual activity: Not on file  Lifestyle  . Physical activity:    Days per week: Not on file    Minutes per session: Not on file  . Stress: Not on file  Relationships  . Social connections:    Talks on phone: Not on file    Gets together: Not on file    Attends religious service: Not on file    Active member of club or organization: Not on file    Attends meetings of clubs or organizations: Not on file    Relationship status: Not on file  . Intimate partner violence:    Fear of current or ex partner: Not on file    Emotionally abused: Not on file    Physically abused: Not on file    Forced sexual activity: Not on file  Other Topics Concern  . Not on file  Social History Narrative   Patient is married and has 2 children.   Patient is right handed.   Patient has Bachelor's degree.   Patient drinks 2 cups daily.     Physical Exam: BP (!) 148/70   Pulse 76   Ht 5\' 10"  (1.778 m)   Wt 159 lb (72.1 kg)   BMI 22.81 kg/m  Constitutional: generally well-appearing Psychiatric: alert and oriented x3 Abdomen: soft, nontender, nondistended, no obvious ascites, no peritoneal signs, normal bowel sounds No peripheral edema noted in lower extremities  Assessment and plan: 85 y.o. male with recurrent lower GI bleeding, presumed diverticular  bleeding  He is on Plavix daily and he needs to be on that for atrial fibrillation and also history of stroke.  We discussed again the fact that we know of no way to prevent future episodes of diverticular bleeding outside of in his case total colectomy given diverticulosis throughout his colon.  Diet with seeds and nuts and berries has been shown to not make a difference in terms of diverticular complications.  If he has recurrent bleeding again I instructed him to call this office and also to alert the emergency room and hospital staff when he reaches the hospital to read this note specifically.  If he is having acute lower GI bleeding he needs emergent nuclear medicine bleeding scan.  That should be done at any time of day or night if he is having acute bleeding again.  If there is any question about what to do we should be consulted by the emergency room staff or any provider.  He will continue on iron once daily indefinitely.  He will have a repeat cbc today to see if his blood counts have rebounded since the bleeding 6 or 8 weeks ago.  Please see the "Patient Instructions" section for addition details about the plan.  Owens Loffler, MD [facility] 03/29/2018, 3:35 PM

## 2018-04-07 DIAGNOSIS — L821 Other seborrheic keratosis: Secondary | ICD-10-CM | POA: Diagnosis not present

## 2018-04-07 DIAGNOSIS — L812 Freckles: Secondary | ICD-10-CM | POA: Diagnosis not present

## 2018-04-07 DIAGNOSIS — Z85828 Personal history of other malignant neoplasm of skin: Secondary | ICD-10-CM | POA: Diagnosis not present

## 2018-04-07 DIAGNOSIS — R69 Illness, unspecified: Secondary | ICD-10-CM | POA: Diagnosis not present

## 2018-04-07 DIAGNOSIS — L570 Actinic keratosis: Secondary | ICD-10-CM | POA: Diagnosis not present

## 2018-04-15 DIAGNOSIS — R69 Illness, unspecified: Secondary | ICD-10-CM | POA: Diagnosis not present

## 2018-04-15 DIAGNOSIS — M25561 Pain in right knee: Secondary | ICD-10-CM | POA: Diagnosis not present

## 2018-04-21 ENCOUNTER — Telehealth: Payer: Self-pay | Admitting: Internal Medicine

## 2018-04-21 ENCOUNTER — Other Ambulatory Visit: Payer: Self-pay

## 2018-04-21 ENCOUNTER — Inpatient Hospital Stay (HOSPITAL_COMMUNITY)
Admission: EM | Admit: 2018-04-21 | Discharge: 2018-04-23 | DRG: 378 | Disposition: A | Discharge status: Home / Self Care | Payer: Medicare HMO | Attending: Internal Medicine | Admitting: Internal Medicine

## 2018-04-21 ENCOUNTER — Encounter (HOSPITAL_COMMUNITY): Payer: Self-pay | Admitting: Pharmacy Technician

## 2018-04-21 DIAGNOSIS — Z9081 Acquired absence of spleen: Secondary | ICD-10-CM

## 2018-04-21 DIAGNOSIS — Z86010 Personal history of colonic polyps: Secondary | ICD-10-CM

## 2018-04-21 DIAGNOSIS — E871 Hypo-osmolality and hyponatremia: Secondary | ICD-10-CM | POA: Diagnosis not present

## 2018-04-21 DIAGNOSIS — Z7984 Long term (current) use of oral hypoglycemic drugs: Secondary | ICD-10-CM

## 2018-04-21 DIAGNOSIS — J329 Chronic sinusitis, unspecified: Secondary | ICD-10-CM | POA: Diagnosis present

## 2018-04-21 DIAGNOSIS — J449 Chronic obstructive pulmonary disease, unspecified: Secondary | ICD-10-CM | POA: Diagnosis present

## 2018-04-21 DIAGNOSIS — G459 Transient cerebral ischemic attack, unspecified: Secondary | ICD-10-CM | POA: Diagnosis not present

## 2018-04-21 DIAGNOSIS — K625 Hemorrhage of anus and rectum: Secondary | ICD-10-CM

## 2018-04-21 DIAGNOSIS — K5731 Diverticulosis of large intestine without perforation or abscess with bleeding: Secondary | ICD-10-CM | POA: Diagnosis not present

## 2018-04-21 DIAGNOSIS — I110 Hypertensive heart disease with heart failure: Secondary | ICD-10-CM | POA: Diagnosis not present

## 2018-04-21 DIAGNOSIS — G4700 Insomnia, unspecified: Secondary | ICD-10-CM | POA: Diagnosis present

## 2018-04-21 DIAGNOSIS — Z79899 Other long term (current) drug therapy: Secondary | ICD-10-CM | POA: Diagnosis not present

## 2018-04-21 DIAGNOSIS — Z79890 Hormone replacement therapy (postmenopausal): Secondary | ICD-10-CM

## 2018-04-21 DIAGNOSIS — Z66 Do not resuscitate: Secondary | ICD-10-CM | POA: Diagnosis present

## 2018-04-21 DIAGNOSIS — I10 Essential (primary) hypertension: Secondary | ICD-10-CM | POA: Diagnosis present

## 2018-04-21 DIAGNOSIS — Z7951 Long term (current) use of inhaled steroids: Secondary | ICD-10-CM

## 2018-04-21 DIAGNOSIS — D62 Acute posthemorrhagic anemia: Secondary | ICD-10-CM | POA: Diagnosis present

## 2018-04-21 DIAGNOSIS — Z8719 Personal history of other diseases of the digestive system: Secondary | ICD-10-CM | POA: Diagnosis not present

## 2018-04-21 DIAGNOSIS — G5621 Lesion of ulnar nerve, right upper limb: Secondary | ICD-10-CM | POA: Diagnosis present

## 2018-04-21 DIAGNOSIS — R58 Hemorrhage, not elsewhere classified: Secondary | ICD-10-CM | POA: Diagnosis not present

## 2018-04-21 DIAGNOSIS — Z820 Family history of epilepsy and other diseases of the nervous system: Secondary | ICD-10-CM

## 2018-04-21 DIAGNOSIS — K219 Gastro-esophageal reflux disease without esophagitis: Secondary | ICD-10-CM | POA: Diagnosis not present

## 2018-04-21 DIAGNOSIS — I5042 Chronic combined systolic (congestive) and diastolic (congestive) heart failure: Secondary | ICD-10-CM | POA: Diagnosis present

## 2018-04-21 DIAGNOSIS — I671 Cerebral aneurysm, nonruptured: Secondary | ICD-10-CM | POA: Diagnosis not present

## 2018-04-21 DIAGNOSIS — G9389 Other specified disorders of brain: Secondary | ICD-10-CM | POA: Diagnosis not present

## 2018-04-21 DIAGNOSIS — R0902 Hypoxemia: Secondary | ICD-10-CM | POA: Diagnosis not present

## 2018-04-21 DIAGNOSIS — R05 Cough: Secondary | ICD-10-CM | POA: Diagnosis not present

## 2018-04-21 DIAGNOSIS — Z7902 Long term (current) use of antithrombotics/antiplatelets: Secondary | ICD-10-CM | POA: Diagnosis not present

## 2018-04-21 DIAGNOSIS — M1990 Unspecified osteoarthritis, unspecified site: Secondary | ICD-10-CM | POA: Diagnosis present

## 2018-04-21 DIAGNOSIS — Z888 Allergy status to other drugs, medicaments and biological substances status: Secondary | ICD-10-CM | POA: Diagnosis not present

## 2018-04-21 DIAGNOSIS — K2980 Duodenitis without bleeding: Secondary | ICD-10-CM | POA: Diagnosis present

## 2018-04-21 DIAGNOSIS — I700 Atherosclerosis of aorta: Secondary | ICD-10-CM | POA: Diagnosis not present

## 2018-04-21 DIAGNOSIS — Z8673 Personal history of transient ischemic attack (TIA), and cerebral infarction without residual deficits: Secondary | ICD-10-CM | POA: Diagnosis not present

## 2018-04-21 DIAGNOSIS — K922 Gastrointestinal hemorrhage, unspecified: Secondary | ICD-10-CM | POA: Diagnosis not present

## 2018-04-21 DIAGNOSIS — Z91048 Other nonmedicinal substance allergy status: Secondary | ICD-10-CM

## 2018-04-21 DIAGNOSIS — J45991 Cough variant asthma: Secondary | ICD-10-CM | POA: Diagnosis present

## 2018-04-21 DIAGNOSIS — E785 Hyperlipidemia, unspecified: Secondary | ICD-10-CM | POA: Diagnosis not present

## 2018-04-21 DIAGNOSIS — E119 Type 2 diabetes mellitus without complications: Secondary | ICD-10-CM | POA: Diagnosis not present

## 2018-04-21 DIAGNOSIS — Z7982 Long term (current) use of aspirin: Secondary | ICD-10-CM | POA: Diagnosis not present

## 2018-04-21 DIAGNOSIS — I672 Cerebral atherosclerosis: Secondary | ICD-10-CM | POA: Diagnosis not present

## 2018-04-21 DIAGNOSIS — K2950 Unspecified chronic gastritis without bleeding: Secondary | ICD-10-CM | POA: Diagnosis present

## 2018-04-21 DIAGNOSIS — R000 Tachycardia, unspecified: Secondary | ICD-10-CM | POA: Diagnosis not present

## 2018-04-21 DIAGNOSIS — Z794 Long term (current) use of insulin: Secondary | ICD-10-CM | POA: Diagnosis not present

## 2018-04-21 DIAGNOSIS — R4701 Aphasia: Secondary | ICD-10-CM | POA: Diagnosis not present

## 2018-04-21 DIAGNOSIS — R4781 Slurred speech: Secondary | ICD-10-CM | POA: Diagnosis not present

## 2018-04-21 DIAGNOSIS — I480 Paroxysmal atrial fibrillation: Secondary | ICD-10-CM | POA: Diagnosis present

## 2018-04-21 DIAGNOSIS — Z881 Allergy status to other antibiotic agents status: Secondary | ICD-10-CM | POA: Diagnosis not present

## 2018-04-21 LAB — URINALYSIS, ROUTINE W REFLEX MICROSCOPIC
Bilirubin Urine: NEGATIVE
Glucose, UA: NEGATIVE mg/dL
Hgb urine dipstick: NEGATIVE
Ketones, ur: NEGATIVE mg/dL
Leukocytes,Ua: NEGATIVE
Nitrite: NEGATIVE
PH: 6 (ref 5.0–8.0)
Protein, ur: NEGATIVE mg/dL
Specific Gravity, Urine: 1.013 (ref 1.005–1.030)

## 2018-04-21 LAB — CBC WITH DIFFERENTIAL/PLATELET
Abs Immature Granulocytes: 0.03 10*3/uL (ref 0.00–0.07)
Basophils Absolute: 0.1 10*3/uL (ref 0.0–0.1)
Basophils Relative: 1 %
Eosinophils Absolute: 1.3 10*3/uL — ABNORMAL HIGH (ref 0.0–0.5)
Eosinophils Relative: 13 %
HEMATOCRIT: 35.7 % — AB (ref 39.0–52.0)
Hemoglobin: 10.1 g/dL — ABNORMAL LOW (ref 13.0–17.0)
Immature Granulocytes: 0 %
LYMPHS ABS: 1.6 10*3/uL (ref 0.7–4.0)
Lymphocytes Relative: 17 %
MCH: 24.4 pg — AB (ref 26.0–34.0)
MCHC: 28.3 g/dL — ABNORMAL LOW (ref 30.0–36.0)
MCV: 86.2 fL (ref 80.0–100.0)
MONOS PCT: 10 %
Monocytes Absolute: 1 10*3/uL (ref 0.1–1.0)
Neutro Abs: 5.7 10*3/uL (ref 1.7–7.7)
Neutrophils Relative %: 59 %
Platelets: 423 10*3/uL — ABNORMAL HIGH (ref 150–400)
RBC: 4.14 MIL/uL — ABNORMAL LOW (ref 4.22–5.81)
RDW: 20.5 % — ABNORMAL HIGH (ref 11.5–15.5)
WBC: 9.7 10*3/uL (ref 4.0–10.5)
nRBC: 0 % (ref 0.0–0.2)

## 2018-04-21 LAB — COMPREHENSIVE METABOLIC PANEL
ALT: 12 U/L (ref 0–44)
AST: 16 U/L (ref 15–41)
Albumin: 3.3 g/dL — ABNORMAL LOW (ref 3.5–5.0)
Alkaline Phosphatase: 62 U/L (ref 38–126)
Anion gap: 4 — ABNORMAL LOW (ref 5–15)
BUN: 14 mg/dL (ref 8–23)
CO2: 22 mmol/L (ref 22–32)
CREATININE: 0.67 mg/dL (ref 0.61–1.24)
Calcium: 7.7 mg/dL — ABNORMAL LOW (ref 8.9–10.3)
Chloride: 111 mmol/L (ref 98–111)
GFR calc Af Amer: >60 mL/min (ref >60)
GFR calc non Af Amer: >60 mL/min (ref >60)
Glucose, Bld: 135 mg/dL — ABNORMAL HIGH (ref 70–99)
Potassium: 3.9 mmol/L (ref 3.5–5.1)
Sodium: 137 mmol/L (ref 135–145)
Total Bilirubin: 0.1 mg/dL — ABNORMAL LOW (ref 0.3–1.2)
Total Protein: 5.9 g/dL — ABNORMAL LOW (ref 6.5–8.1)

## 2018-04-21 LAB — PROTIME-INR
INR: 1 (ref 0.8–1.2)
Prothrombin Time: 13.4 seconds (ref 11.4–15.2)

## 2018-04-21 LAB — APTT: aPTT: 27 seconds (ref 24–36)

## 2018-04-21 MED ORDER — PANTOPRAZOLE SODIUM 40 MG IV SOLR
40.0000 mg | INTRAVENOUS | Status: DC
  Administered 2018-04-22: 40 mg via INTRAVENOUS
  Filled 2018-04-21: qty 40

## 2018-04-21 MED ORDER — ONDANSETRON HCL 4 MG/2ML IJ SOLN: 4.0000 mg | Freq: Four times a day (QID) | INTRAMUSCULAR | Status: DC | PRN

## 2018-04-21 MED ORDER — ONDANSETRON HCL 4 MG PO TABS: 4.0000 mg | ORAL_TABLET | Freq: Four times a day (QID) | ORAL | Status: DC | PRN

## 2018-04-21 NOTE — ED Provider Notes (Signed)
Emergency Department Provider Note   I have reviewed the triage vital signs and the nursing notes.   HISTORY  Chief Complaint Rectal Bleeding   HPI [name] is a 85 y.o. male with PMH of CVA on Plavix, DM, CHF, HTN, HLD, Diverticulosis, and GI bleeding history followed by [facility] presents to the emergency department with bright red blood per rectum starting this evening.  Patient has had GI bleeding in the past and follows with Dr. Ardis Hughs.  They have suspected diverticular bleed and have been attempting to obtain a nuclear medicine study if bleeding returns.  Patient had 3 episodes of bright red blood with sensation of needing to have a bowel movement.  He is not experiencing any abdominal pain or fever.  He continues to take his Plavix as instructed.  He denies passage of blood without a bowel movement but has had 3 episodes this evening.  No lightheadedness or shortness of breath.  No black in the bowel movements.  Patient called his GI physician and was referred to the emergency department.   Past Medical History:  Diagnosis Date  . Acute CVA (cerebrovascular accident) ([place]) 07/25/2015  . Asthma   . Benign essential HTN   . Cerebral infarction due to embolism of left middle cerebral artery ([place]) 02/03/2014  . Chronic combined systolic and diastolic CHF (congestive heart failure) ([place]) 12/23/2017  . Colitis   . Colon polyps   . Diabetes mellitus type 2, diet-controlled ([place]) 12/22/2013  . Diverticulosis   . GERD (gastroesophageal reflux disease)   . GI bleed   . Hemorrhoids   . Hiatal hernia   . History of esophageal strciture   . HLD (hyperlipidemia)   . Osteoarthritis   . Proctitis   . Status post dilation of esophageal narrowing   . Stroke [facility])     Patient Active Problem List   Diagnosis Date Noted  . Rectal bleeding   . GERD (gastroesophageal reflux disease) 04/21/2018  . Hematochezia   . Hypokalemia 12/25/2017  . Diverticulosis   . Acute blood loss anemia  12/24/2017  . Syncope due to orthostatic hypotension 12/23/2017  . Chronic combined systolic and diastolic CHF (congestive heart failure) ([place]) 12/23/2017  . GI bleed 10/25/2016  . BPPV (benign paroxysmal positional vertigo) 10/25/2016  . Lower GI bleed 10/24/2016  . Syncope 10/24/2016  . TIA (transient ischemic attack) 07/22/2016  . Benign essential HTN   . PAF (paroxysmal atrial fibrillation) ([place])   . Stroke-like symptom 02/22/2016  . Leukocytosis 02/22/2016  . Acute lower GI bleeding 02/01/2016  . History of lower GI bleeding Dec 2017 02/01/2016  . Chronic anticoagulation 2/2 history of CVA 01/31/2016  . History of CVA (cerebrovascular accident) 01/31/2016  . Sinusitis, chronic 12/12/2015  . Cough variant asthma 10/31/2015  . Insomnia 07/25/2015  . HLD (hyperlipidemia) 02/03/2014  . Ulnar neuropathy at elbow of right upper extremity 02/03/2014  . Cervical radiculopathy 02/03/2014  . Seizures ([place])   . Diabetes mellitus type II, non insulin dependent ([place]) 12/22/2013  . Recurrent ventral hernia 12/15/2011  . History of esophageal strciture   . Diverticulosis of colon with hemorrhage 06/24/2007    Past Surgical History:  Procedure Laterality Date  . ABDOMINAL HERNIA REPAIR    . COLONOSCOPY     multiple  . COLONOSCOPY WITH PROPOFOL N/A 12/12/2016   Procedure: COLONOSCOPY WITH PROPOFOL;  Surgeon: Gatha Mayer, MD;  Location: [facility] ENDOSCOPY;  Service: Endoscopy;  Laterality: N/A;  . ESOPHAGOGASTRODUODENOSCOPY  multiple  . INSERTION OF MESH  01/29/2012   Procedure: INSERTION OF MESH;  Surgeon: Gwenyth Ober, MD;  Location: [place];  Service: General;  Laterality: N/A;  . NISSEN FUNDOPLICATION    . SPLENECTOMY, TOTAL     nontraumatic rupture  . VENTRAL HERNIA REPAIR  01/29/2012    WITH MESH  . VENTRAL HERNIA REPAIR  01/29/2012   Procedure: HERNIA REPAIR VENTRAL ADULT;  Surgeon: Gwenyth Ober, MD;  Location: [place];  Service: General;  Laterality: N/A;  open recurrent  ventral hernia repair with mesh    Allergies Tape and Azithromycin  Family History  Problem Relation Age of Onset  . Alzheimer's disease Mother   . Breast cancer Mother   . Throat cancer Father     Social History Social History   Tobacco Use  . Smoking status: Never Smoker  . Smokeless tobacco: Never Used  Substance Use Topics  . Alcohol use: Yes    Alcohol/week: 1.0 standard drinks    Types: 1 Glasses of wine per week    Comment: daily  . Drug use: No    Review of Systems  Constitutional: No fever/chills Eyes: No visual changes. ENT: No sore throat. Cardiovascular: Denies chest pain. Respiratory: Denies shortness of breath. Gastrointestinal: No abdominal pain.  No nausea, no vomiting.  No diarrhea.  No constipation. Positive GI bleeding.  Genitourinary: Negative for dysuria. Musculoskeletal: Negative for back pain. Skin: Negative for rash. Neurological: Negative for headaches, focal weakness or numbness.  10-point ROS otherwise negative.  ____________________________________________   PHYSICAL EXAM:  VITAL SIGNS: ED Triage Vitals  Enc Vitals Group     BP 04/21/18 2134 (!) 142/82     Pulse Rate 04/21/18 2134 99     Resp 04/21/18 2134 (!) 24     Temp 04/21/18 2134 98.4 F (36.9 C)     Temp Source 04/21/18 2134 Oral     SpO2 04/21/18 2134 95 %     Weight 04/21/18 2125 158 lb (71.7 kg)     Height 04/21/18 2125 5\' 10"  (1.778 m)     Pain Score 04/21/18 2125 0   Constitutional: Alert and oriented. Well appearing and in no acute distress. Eyes: Conjunctivae are normal. Head: Atraumatic. Nose: No congestion/rhinnorhea. Mouth/Throat: Mucous membranes are moist.  Neck: No stridor.   Cardiovascular: Normal rate, regular rhythm. Good peripheral circulation. Grossly normal heart sounds.   Respiratory: Normal respiratory effort.  No retractions. Lungs CTAB. Gastrointestinal: Soft and nontender. No distention. Rectal exam performed with patient consent. Scant  maroon colored stool on exam glove. No melena or large volume BRBPR.  Musculoskeletal: No lower extremity tenderness nor edema. No gross deformities of extremities. Neurologic:  Normal speech and language. No gross focal neurologic deficits are appreciated.  Skin:  Skin is warm, dry and intact. No rash noted.  ____________________________________________   LABS (all labs ordered are listed, but only abnormal results are displayed)  Labs Reviewed  COMPREHENSIVE METABOLIC PANEL - Abnormal; Notable for the following components:      Result Value   Glucose, Bld 135 (*)    Calcium 7.7 (*)    Total Protein 5.9 (*)    Albumin 3.3 (*)    Total Bilirubin 0.1 (*)    Anion gap 4 (*)    All other components within normal limits  CBC WITH DIFFERENTIAL/PLATELET - Abnormal; Notable for the following components:   RBC 4.14 (*)    Hemoglobin 10.1 (*)    HCT 35.7 (*)  MCH 24.4 (*)    MCHC 28.3 (*)    RDW 20.5 (*)    Platelets 423 (*)    Eosinophils Absolute 1.3 (*)    All other components within normal limits  URINALYSIS, ROUTINE W REFLEX MICROSCOPIC - Abnormal; Notable for the following components:   Color, Urine STRAW (*)    All other components within normal limits  HEMOGLOBIN - Abnormal; Notable for the following components:   Hemoglobin 9.0 (*)    All other components within normal limits  URINE CULTURE  PROTIME-INR  APTT  CBC  POC OCCULT BLOOD, ED  TYPE AND SCREEN   ____________________________________________  RADIOLOGY  None ____________________________________________   PROCEDURES  Procedure(s) performed:   Procedures  None ____________________________________________   INITIAL IMPRESSION / ASSESSMENT AND PLAN / ED COURSE  Pertinent labs & imaging results that were available during my care of the patient were reviewed by me and considered in my medical decision making (see chart for details).  Patient with history of GI bleeding and concern for possible  diverticular bleed presents to the emergency department with bright red blood per rectum x3 this afternoon and evening.  No focal abdominal tenderness.  Lab work is pending.  Patient sent by his gastroenterologist for nuclear medicine scan which has been ordered.  I will reach out to GI on-call to discuss the timing of this exam.  No hemodynamic instability.   Spoke with Dr. Carlean Purl with GI. Recommends monitoring overnight. If patient begins to bleed actively and NM study cannot be obtained, could consider CT angio of the abdomen for quick look. If not actively bleeding, would recommend overnight monitoring and they will consult in the AM. Patient can get NM scan in the AM.  Labs reviewed with hemoglobin of 10.  No transfusion at this time.  Plan for overnight monitoring once remaining labs are resulted.  Discussed patient's case with Hospitalist, Dr. Olevia Bowens to request admission. Patient and family (if present) updated with plan. Care transferred to Hospitalist service.  I reviewed all nursing notes, vitals, pertinent old records, EKGs, labs, imaging (as available).  ____________________________________________  FINAL CLINICAL IMPRESSION(S) / ED DIAGNOSES  Final diagnoses:  Rectal bleeding     MEDICATIONS GIVEN DURING THIS VISIT:  Medications  ondansetron (ZOFRAN) tablet 4 mg (has no administration in time range)    Or  ondansetron (ZOFRAN) injection 4 mg (has no administration in time range)  rOPINIRole (REQUIP) tablet 2 mg (2 mg Oral Given 04/22/18 0125)  montelukast (SINGULAIR) tablet 10 mg (10 mg Oral Given 04/22/18 0125)  ipratropium-albuterol (DUONEB) 0.5-2.5 (3) MG/3ML nebulizer solution 3 mL (has no administration in time range)  zolpidem (AMBIEN) tablet 5 mg (5 mg Oral Given 04/22/18 0125)  carvedilol (COREG) tablet 3.125 mg (has no administration in time range)  pantoprazole (PROTONIX) EC tablet 40 mg (40 mg Oral Given 04/22/18 1029)  albuterol (PROVENTIL) (2.5 MG/3ML) 0.083%  nebulizer solution 3 mL (has no administration in time range)  baclofen (LIORESAL) tablet 10 mg (has no administration in time range)  fluticasone (FLONASE) 50 MCG/ACT nasal spray 1 spray (has no administration in time range)  guaiFENesin (MUCINEX) 12 hr tablet 600-1,200 mg (has no administration in time range)  Melatonin TABS 6 mg (has no administration in time range)  mometasone-formoterol (DULERA) 100-5 MCG/ACT inhaler 1 puff (has no administration in time range)  simvastatin (ZOCOR) tablet 40 mg (has no administration in time range)  ipratropium-albuterol (DUONEB) 0.5-2.5 (3) MG/3ML nebulizer solution 3 mL (3 mLs Nebulization Given 04/22/18 0149)  Note:  This document was prepared using Dragon voice recognition software and may include unintentional dictation errors.  Nanda Quinton, MD Emergency Medicine    Long, Wonda Old[facility], MD 04/22/18 1059

## 2018-04-21 NOTE — Telephone Encounter (Signed)
Patient having hematochezia as he has had in past. Recurrent diverticular bleeding has been suspected.  Wife conveying and chart review confirms that Dr. Ardis Hughs has recommended patient have a nuc med bleeding scan in this situation.  We will see patient in AM and sooner if needed  Plavix will need to be held.

## 2018-04-21 NOTE — ED Notes (Signed)
EKG completed not able to export or print. NS

## 2018-04-21 NOTE — ED Notes (Signed)
RN Ronalee Belts to get blood when he starts IV

## 2018-04-21 NOTE — H&P (Signed)
History and Physical    [name] ZOX:[id number] DOB: 1931/12/03 DOA: 04/21/2018  PCP: Seward Carol, MD   Patient coming from: Home.  I have personally briefly reviewed patient's old medical records in [place]  Chief Complaint: Rectal bleeding.  HPI: [name] is a 84 y.o. male with medical history significant of acute CVA, asthma, hypertension, embolic CVA of left middle cerebral artery, chronic systolic and diastolic CHF, history of colitis and colon polyps, type 2 diabetes, diverticulosis, GERD, hiatal hernia, history of esophageal stricture, hyperlipidemia, osteoarthritis, proctitis, history of multiple lower GI bleeds who is coming to the emergency department after he had 3 bloody bowel movements after dinner.  He spoke to his gastroenterologist who recommended for him to go to the emergency department NM bleeding scan.  He denies nausea, emesis, but states he has had melena or hematochezia.  Fever, chills, sore throat, dyspnea, chest pain, palpitations, dizziness, diaphoresis, orthopnea or recent pitting edema of the lower extremities.  No dysuria, frequency or hematuria.  No polyuria, polydipsia or polyphagia.  ED Course: Initial vital signs temperature 98.4 F, pulse 99, respirations 24, blood pressure 142/82 mmHg and O2 sat 95% on room air.  His urinalysis was unremarkable.  CBC showed a white count was 9.7, hemoglobin 10.1 g/dL and platelets 423.  PT was 13.4, INR 1.0 and APTT 27.  CMP shows normal electrolytes except for calcium of 7.7 mg/dL.  Glucose 135, BUN 14 and creatinine 0.67 mg/dL.  Total protein was 5.9 and albumin 3.3 g/dL.  The rest of the LFTs were within normal limits.  Review of Systems: As per HPI otherwise 10 point review of systems negative.   Past Medical History:  Diagnosis Date  . Acute CVA (cerebrovascular accident) ([place]) 07/25/2015  . Asthma   . Benign essential HTN   . Cerebral infarction due to embolism of left middle cerebral artery ([place])  02/03/2014  . Chronic combined systolic and diastolic CHF (congestive heart failure) ([place]) 12/23/2017  . Colitis   . Colon polyps   . Diabetes mellitus type 2, diet-controlled ([place]) 12/22/2013  . Diverticulosis   . GERD (gastroesophageal reflux disease)   . GI bleed   . Hemorrhoids   . Hiatal hernia   . History of esophageal strciture   . HLD (hyperlipidemia)   . Osteoarthritis   . Proctitis   . Status post dilation of esophageal narrowing   . Stroke [facility])     Past Surgical History:  Procedure Laterality Date  . ABDOMINAL HERNIA REPAIR    . COLONOSCOPY     multiple  . COLONOSCOPY WITH PROPOFOL N/A 12/12/2016   Procedure: COLONOSCOPY WITH PROPOFOL;  Surgeon: Gatha Mayer, MD;  Location: [facility] ENDOSCOPY;  Service: Endoscopy;  Laterality: N/A;  . ESOPHAGOGASTRODUODENOSCOPY     multiple  . INSERTION OF MESH  01/29/2012   Procedure: INSERTION OF MESH;  Surgeon: Gwenyth Ober, MD;  Location: [place];  Service: General;  Laterality: N/A;  . NISSEN FUNDOPLICATION    . SPLENECTOMY, TOTAL     nontraumatic rupture  . VENTRAL HERNIA REPAIR  01/29/2012    WITH MESH  . VENTRAL HERNIA REPAIR  01/29/2012   Procedure: HERNIA REPAIR VENTRAL ADULT;  Surgeon: Gwenyth Ober, MD;  Location: [place];  Service: General;  Laterality: N/A;  open recurrent ventral hernia repair with mesh     reports that he has never smoked. He has never used smokeless tobacco. He reports current alcohol use of about 1.0 standard drinks  of alcohol per week. He reports that he does not use drugs.  Allergies  Allergen Reactions  . Tape Other (See Comments)    SKIN IS SENSITIVE; PLEASE USE PAPER TAPE; SKIN BRUISES AND TEARS EASILY!!  . Azithromycin Rash and Other (See Comments)    Possible reaction to Z-Pak    Family History  Problem Relation Age of Onset  . Cancer Father    Prior to Admission medications   Medication Sig Start Date End Date Taking? Authorizing Provider  acetaminophen (TYLENOL) 500 MG tablet Take  1,000 mg by mouth every 6 (six) hours as needed (pain or headaches).     [provider]  albuterol (PROAIR HFA) 108 (90 Base) MCG/ACT inhaler Inhale 2 puffs into the lungs every 6 (six) hours as needed for wheezing or shortness of breath.    [provider]  baclofen (LIORESAL) 10 MG tablet Take 0.5 tablets (5 mg total) by mouth at bedtime as needed for muscle spasms. Patient taking differently: Take 10 mg by mouth at bedtime.  10/25/16   Debbe Odea, MD  carvedilol (COREG) 3.125 MG tablet Take 1 tablet (3.125 mg total) by mouth 2 (two) times daily with a meal. 02/26/16   Amin, Jeanella Flattery, MD  cetirizine (ZYRTEC) 10 MG tablet Take 10 mg by mouth at bedtime as needed for allergies or rhinitis.     [provider]  clopidogrel (PLAVIX) 75 MG tablet Take 1 tablet (75 mg total) by mouth daily. 02/10/18   Thurnell Lose, MD  ferrous sulfate 325 (65 FE) MG tablet Take 1 tablet (325 mg total) by mouth 2 (two) times daily with a meal. 02/07/18   Thurnell Lose, MD  fluticasone (FLONASE) 50 MCG/ACT nasal spray Place 1 spray into both nostrils every morning.  06/18/14   [provider]  guaiFENesin (MUCINEX) 600 MG 12 hr tablet Take 600-1,200 mg by mouth daily as needed for cough or to loosen phlegm.     [provider]  Melatonin 5 MG TABS Take 5 mg by mouth at bedtime.    [provider]  metFORMIN (GLUCOPHAGE) 1000 MG tablet Take 1,000 mg by mouth daily with breakfast.    [provider]  mometasone-formoterol (DULERA) 100-5 MCG/ACT AERO Inhale 2 puffs into the lungs 2 (two) times daily. Patient taking differently: Inhale 1 puff into the lungs 2 (two) times daily.  12/12/15   Tanda Rockers, MD  montelukast (SINGULAIR) 10 MG tablet Take 1 tablet (10 mg total) by mouth at bedtime. 10/05/17   Tanda Rockers, MD  pantoprazole (PROTONIX) 40 MG tablet Take 1 tablet (40 mg total) by mouth daily. 02/07/18   Thurnell Lose, MD  psyllium  (METAMUCIL) 58.6 % powder Take 1 packet by mouth every morning.     [provider]  rOPINIRole (REQUIP) 0.5 MG tablet Take 0.5 mg by mouth at bedtime.    [provider]  simvastatin (ZOCOR) 40 MG tablet Take 1 tablet (40 mg total) by mouth daily. Patient taking differently: Take 40 mg by mouth at bedtime.  02/26/16   Amin, Jeanella Flattery, MD  zolpidem (AMBIEN) 10 MG tablet Take 5-10 mg by mouth at bedtime as needed for sleep.     [provider]    Physical Exam: Vitals:   04/21/18 2125 04/21/18 2134 04/21/18 2200  BP:  (!) 142/82 128/85  Pulse:  99 98  Resp:  (!) 24 20  Temp:  98.4 F (36.9 C)   TempSrc:  Oral   SpO2:  95% 95%  Weight: 71.7 kg    Height: 5\' 10"  (1.778 m)      Constitutional: NAD, calm, comfortable Eyes: PERRL, lids and conjunctivae mildly pale. ENMT: Mucous membranes are moist. Posterior pharynx clear of any exudate or lesions. Neck: normal, supple, no masses, no thyromegaly Respiratory: Decreased breath sounds on bases, but otherwise clear to auscultation bilaterally, no wheezing, no crackles. Normal respiratory effort. No accessory muscle use.  Cardiovascular: Regular rate and rhythm, no murmurs / rubs / gallops. No extremity edema. 2+ pedal pulses. No carotid bruits.  Abdomen: Soft, mild LLQ tenderness, no guarding, no masses palpated. No hepatosplenomegaly. Bowel sounds positive.  Musculoskeletal: no clubbing / cyanosis. Good ROM, no contractures. Normal muscle tone.  Skin: no rashes, lesions, ulcers. No induration Neurologic: CN 2-12 grossly intact. Sensation intact, DTR normal. Strength 5/5 in all 4.  Psychiatric: Normal judgment and insight. Alert and oriented x 3. Normal mood.   Labs on Admission: I have personally reviewed following labs and imaging studies  CBC: Recent Labs  Lab 04/21/18 2150  WBC 9.7  NEUTROABS 5.7  HGB 10.1*  HCT 35.7*  MCV 86.2  PLT 786*   Basic Metabolic Panel: Recent Labs  Lab 04/21/18 2150    NA 137  K 3.9  CL 111  CO2 22  GLUCOSE 135*  BUN 14  CREATININE 0.67  CALCIUM 7.7*   GFR: Estimated Creatinine Clearance: 69.7 mL/min (by C-G formula based on SCr of 0.67 mg/dL). Liver Function Tests: Recent Labs  Lab 04/21/18 2150  AST 16  ALT 12  ALKPHOS 62  BILITOT 0.1*  PROT 5.9*  ALBUMIN 3.3*   No results for input(s): LIPASE, AMYLASE in the last 168 hours. No results for input(s): AMMONIA in the last 168 hours. Coagulation Profile: Recent Labs  Lab 04/21/18 2150  INR 1.0   Cardiac Enzymes: No results for input(s): CKTOTAL, CKMB, CKMBINDEX, TROPONINI in the last 168 hours. BNP (last 3 results) No results for input(s): PROBNP in the last 8760 hours. HbA1C: No results for input(s): HGBA1C in the last 72 hours. CBG: No results for input(s): GLUCAP in the last 168 hours. Lipid Profile: No results for input(s): CHOL, HDL, LDLCALC, TRIG, CHOLHDL, LDLDIRECT in the last 72 hours. Thyroid Function Tests: No results for input(s): TSH, T4TOTAL, FREET4, T3FREE, THYROIDAB in the last 72 hours. Anemia Panel: No results for input(s): VITAMINB12, FOLATE, FERRITIN, TIBC, IRON, RETICCTPCT in the last 72 hours. Urine analysis:    Component Value Date/Time   COLORURINE STRAW (A) 04/21/2018 2320   APPEARANCEUR CLEAR 04/21/2018 2320   LABSPEC 1.013 04/21/2018 2320   PHURINE 6.0 04/21/2018 2320   GLUCOSEU NEGATIVE 04/21/2018 2320   HGBUR NEGATIVE 04/21/2018 2320   BILIRUBINUR NEGATIVE 04/21/2018 2320   KETONESUR NEGATIVE 04/21/2018 2320   PROTEINUR NEGATIVE 04/21/2018 2320   UROBILINOGEN 0.2 12/22/2013 1210   NITRITE NEGATIVE 04/21/2018 2320   LEUKOCYTESUR NEGATIVE 04/21/2018 2320    Radiological Exams on Admission: No results found.  Echo 02/24/2016  ------------------------------------------------------------------- LV EF: 45%  ------------------------------------------------------------------- Indications:      CVA  436.  ------------------------------------------------------------------- History:   PMH:   Atrial fibrillation.  Risk factors: Hypertension. Diabetes mellitus.  ------------------------------------------------------------------- Study Conclusions  - Left ventricle: The cavity size was normal. Wall thickness was   normal. Systolic function was mildly to moderately reduced. The   estimated ejection fraction was 45%. Doppler parameters are   consistent with abnormal left ventricular relaxation (grade 1   diastolic dysfunction).  EKG: Independently reviewed.   Assessment/Plan Principal Problem:   Lower GI bleed Admit to telemetry/inpatient Keep n.p.o. Supplemental oxygen as needed. IV fluids deferred due to history of systolic CHF. Monitor hematocrit and hemoglobin. Transfuse as needed. GI to evaluate in a.m. Bleeding scan has been ordered by Dr. Laverta Baltimore. Follow-up results when available.  Active Problems:   Acute blood loss anemia Monitor hematocrit and hemoglobin. Transfuse as needed.    GERD (gastroesophageal reflux disease) On IV Protonix.   Diabetes mellitus type II, non insulin dependent (HCC)   HLD (hyperlipidemia)   Benign essential HTN   Chronic combined systolic and diastolic CHF (congestive heart failure) (HCC)   DVT prophylaxis: SCDs. Code Status: DNR. Family Communication: His spouse was present in the ED. Disposition Plan: Admit for H&H monitoring, bleeding scan and GI evaluation. Consults called: Dr. Laverta Baltimore spoke with Dr. Carlean Purl who recommended H&H monitoring and nuclear medicine bleeding scan if the patient's rectal bleeding recurs.  CTA abdomen if unable to obtain bleeding scan. Admission status: Inpatient/telemetry.   Reubin Milan MD [facility]  04/21/2018, 11:56 PM   This document was prepared using Dragon voice recognition software and may contain some unintended transcription errors.

## 2018-04-21 NOTE — ED Triage Notes (Signed)
Pt arrives via EMS after having 3 episodes of rectal bleeding. Pt with hx of diverticulitis. Told by Dr Ardis Hughs with [facility] to come in if he had another episode of rectal bleeding to have a "bleeding scan". 142/90, HR 105, RR 20, 93% RA, CBG 205.

## 2018-04-22 ENCOUNTER — Encounter (HOSPITAL_COMMUNITY): Payer: Self-pay | Admitting: Internal Medicine

## 2018-04-22 ENCOUNTER — Other Ambulatory Visit: Payer: Self-pay

## 2018-04-22 ENCOUNTER — Inpatient Hospital Stay (HOSPITAL_COMMUNITY): Payer: Medicare HMO

## 2018-04-22 DIAGNOSIS — E119 Type 2 diabetes mellitus without complications: Secondary | ICD-10-CM

## 2018-04-22 DIAGNOSIS — K5731 Diverticulosis of large intestine without perforation or abscess with bleeding: Secondary | ICD-10-CM

## 2018-04-22 DIAGNOSIS — D62 Acute posthemorrhagic anemia: Secondary | ICD-10-CM

## 2018-04-22 DIAGNOSIS — K625 Hemorrhage of anus and rectum: Secondary | ICD-10-CM

## 2018-04-22 DIAGNOSIS — I5042 Chronic combined systolic (congestive) and diastolic (congestive) heart failure: Secondary | ICD-10-CM

## 2018-04-22 LAB — TYPE AND SCREEN
ABO/RH(D): O POS
Antibody Screen: NEGATIVE

## 2018-04-22 LAB — CBC
HCT: 29.9 % — ABNORMAL LOW (ref 39.0–52.0)
Hemoglobin: 8.9 g/dL — ABNORMAL LOW (ref 13.0–17.0)
MCH: 24.3 pg — ABNORMAL LOW (ref 26.0–34.0)
MCHC: 29.8 g/dL — ABNORMAL LOW (ref 30.0–36.0)
MCV: 81.7 fL (ref 80.0–100.0)
Platelets: 406 10*3/uL — ABNORMAL HIGH (ref 150–400)
RBC: 3.66 MIL/uL — ABNORMAL LOW (ref 4.22–5.81)
RDW: 20.1 % — ABNORMAL HIGH (ref 11.5–15.5)
WBC: 9.8 10*3/uL (ref 4.0–10.5)
nRBC: 0 % (ref 0.0–0.2)

## 2018-04-22 LAB — HEMOGLOBIN: Hemoglobin: 9 g/dL — ABNORMAL LOW (ref 13.0–17.0)

## 2018-04-22 LAB — GLUCOSE, CAPILLARY: Glucose-Capillary: 121 mg/dL — ABNORMAL HIGH (ref 70–99)

## 2018-04-22 MED ORDER — MONTELUKAST SODIUM 10 MG PO TABS
10.0000 mg | ORAL_TABLET | Freq: Every day | ORAL | Status: DC
  Administered 2018-04-22 (×2): 10 mg via ORAL
  Filled 2018-04-22 (×2): qty 1

## 2018-04-22 MED ORDER — MOMETASONE FURO-FORMOTEROL FUM 100-5 MCG/ACT IN AERO
1.0000 | INHALATION_SPRAY | Freq: Two times a day (BID) | RESPIRATORY_TRACT | Status: DC
  Administered 2018-04-22 – 2018-04-23 (×2): 1 via RESPIRATORY_TRACT
  Filled 2018-04-22: qty 8.8

## 2018-04-22 MED ORDER — CARVEDILOL 3.125 MG PO TABS
3.1250 mg | ORAL_TABLET | Freq: Two times a day (BID) | ORAL | Status: DC
  Administered 2018-04-22 – 2018-04-23 (×2): 3.125 mg via ORAL
  Filled 2018-04-22 (×2): qty 1

## 2018-04-22 MED ORDER — BACLOFEN 10 MG PO TABS
10.0000 mg | ORAL_TABLET | Freq: Every day | ORAL | Status: DC
  Administered 2018-04-22: 10 mg via ORAL
  Filled 2018-04-22: qty 1

## 2018-04-22 MED ORDER — ROPINIROLE HCL 1 MG PO TABS
2.0000 mg | ORAL_TABLET | Freq: Every day | ORAL | Status: DC
  Administered 2018-04-22 (×2): 2 mg via ORAL
  Filled 2018-04-22 (×2): qty 2

## 2018-04-22 MED ORDER — IPRATROPIUM-ALBUTEROL 0.5-2.5 (3) MG/3ML IN SOLN
3.0000 mL | Freq: Once | RESPIRATORY_TRACT | Status: AC
  Administered 2018-04-22: 3 mL via RESPIRATORY_TRACT
  Filled 2018-04-22: qty 3

## 2018-04-22 MED ORDER — IPRATROPIUM-ALBUTEROL 0.5-2.5 (3) MG/3ML IN SOLN
3.0000 mL | RESPIRATORY_TRACT | Status: DC | PRN
  Administered 2018-04-22: 3 mL via RESPIRATORY_TRACT
  Filled 2018-04-22: qty 3

## 2018-04-22 MED ORDER — MELATONIN 3 MG PO TABS
6.0000 mg | ORAL_TABLET | Freq: Every day | ORAL | Status: DC
  Administered 2018-04-22: 6 mg via ORAL
  Filled 2018-04-22: qty 2

## 2018-04-22 MED ORDER — FLUTICASONE PROPIONATE 50 MCG/ACT NA SUSP
1.0000 | NASAL | Status: DC
  Administered 2018-04-23: 1 via NASAL
  Filled 2018-04-22: qty 16

## 2018-04-22 MED ORDER — SIMVASTATIN 20 MG PO TABS
40.0000 mg | ORAL_TABLET | Freq: Every day | ORAL | Status: DC
  Administered 2018-04-22: 40 mg via ORAL
  Filled 2018-04-22: qty 2

## 2018-04-22 MED ORDER — PANTOPRAZOLE SODIUM 40 MG PO TBEC
40.0000 mg | DELAYED_RELEASE_TABLET | Freq: Every day | ORAL | Status: DC
  Administered 2018-04-22 – 2018-04-23 (×2): 40 mg via ORAL
  Filled 2018-04-22 (×2): qty 1

## 2018-04-22 MED ORDER — ZOLPIDEM TARTRATE 5 MG PO TABS: 5.0000 mg | ORAL_TABLET | Freq: Every evening | ORAL | Status: DC | PRN

## 2018-04-22 MED ORDER — GUAIFENESIN ER 600 MG PO TB12: 600.0000 mg | ORAL_TABLET | Freq: Every day | ORAL | Status: DC | PRN

## 2018-04-22 MED ORDER — ZOLPIDEM TARTRATE 5 MG PO TABS
5.0000 mg | ORAL_TABLET | Freq: Every evening | ORAL | Status: DC | PRN
  Administered 2018-04-22 (×2): 5 mg via ORAL
  Filled 2018-04-22 (×2): qty 1

## 2018-04-22 MED ORDER — ALBUTEROL SULFATE (2.5 MG/3ML) 0.083% IN NEBU: 3.0000 mL | INHALATION_SOLUTION | Freq: Four times a day (QID) | RESPIRATORY_TRACT | Status: DC | PRN

## 2018-04-22 NOTE — Progress Notes (Signed)
Report received from ED nurse. Pt. Arrived to unit. A/Ox4. Placed on telemetry. Wife at beside. Will continue to monitor.

## 2018-04-22 NOTE — Consult Note (Signed)
[facility] Consult: 8:07 AM 04/22/2018  LOS: 1 day    Referring Provider: Dr Erlinda Hong  Primary Care Physician:  Seward Carol, MD Primary Gastroenterologist:  Dr Ardis Hughs     Reason for Consultation:  Hematochezia.     HPI: [name] is a 85 y.o. male.  Past hx Chronic Plavix.  A fib. CVA 07/2015, recurrent CVA 02/2016.  DM 2.  COPD. CHF EF of 45%.  Seizures.  Hyperlipidemia.  Rectal bleeding/painless hematochezia,  diverticular bleed versus hemorrhoidal, 01/2016 with negative nuclear medicine bleeding scan.  Recurrent lower GI bleeding +/- anemia in 10/2016, 12/2017, 01/2018.   Has required PRBCs on multiple occasions for ABL anemia.  S/p paraesophageal hernia repair 2004. Ventral hernia repair 2013.  Splenectomy, due to traumatic injury ~ 2007.  Multiple previous sigmoid and colonoscopies dating back to the mid 1980s. Area of narrowing associated with severe left-sided diverticulosis, internal hemorrhoids 1985.  Segmental colitis in the rectum 1990.  Biopsies 1990 showed chronic ileitis with lymphoid hyperplasia and ulcerative colitis from the sigmoid biopsies  Proctitis and diverticulosis 1995.  On all studies diverticulosis has been noted.  Not clear when he had polyps as there are none noted on the extensive records. 2003 EGD.  For dysphagia.  Found chronic gastritis and duodenitis. 2003 colonoscopy.  Diverticulosis, non-adenomatous polyps. 07/2007 Colonoscopy.  Diverticulosis.   11/2016 colonoscopy for painless hematochezia.  Colon prep fair in left colon, poor in right colon.  Left sided diverticulosis without active bleeding.  Brown liquid stool seen throughout the colon.  During last bleeding admission in 01/2018 plan if he had further bleeding was for nuc med RBC scan >> CT angio/embolization but bleeding  stopped off Plavix.  He received 2 U PRBCs for Hgb ~ 8.  02/04/18 CT ab/pelvis: no acute process, moderate HH.    At GI fup of 03/29/18 Dr Ardis Hughs recommended daily po iron and immediate RBC scan in event of future bleed.  Pt had resumed Plavix.  Hgb was up to 10.7 from 8.    Now readmitted with recurrent hematochezia.  3 episodes in quick succession less than 60 minutes starting around 8 PM yesterday evening.  No dizziness, no weakness.  Earlier in the day he had some twinges of left abdominal discomfort but during the bleeding episodes there was no abdominal pain.  This morning around 830 he had a small episode of painless hematochezia.  Still has not developed dizziness. Vital signs stable, no hemodynamic instability Hgb 10.1 >> 9.  BUN, PT/INR normal.   Nuc med study ordered and planned sometime this AM.    Pt NPO but does not need to be NPO for the RBC scan. Last took Plavix in AM 3/5.       Past Medical History:  Diagnosis Date  . Acute CVA (cerebrovascular accident) ([place]) 07/25/2015  . Asthma   . Benign essential HTN   . Cerebral infarction due to embolism of left middle cerebral artery ([place]) 02/03/2014  . Chronic combined systolic and diastolic CHF (congestive heart failure) ([place]) 12/23/2017  . Colitis   .  Colon polyps   . Diabetes mellitus type 2, diet-controlled ([place]) 12/22/2013  . Diverticulosis   . GERD (gastroesophageal reflux disease)   . GI bleed   . Hemorrhoids   . Hiatal hernia   . History of esophageal strciture   . HLD (hyperlipidemia)   . Osteoarthritis   . Proctitis   . Status post dilation of esophageal narrowing   . Stroke [facility])     Past Surgical History:  Procedure Laterality Date  . ABDOMINAL HERNIA REPAIR    . COLONOSCOPY     multiple  . COLONOSCOPY WITH PROPOFOL N/A 12/12/2016   Procedure: COLONOSCOPY WITH PROPOFOL;  Surgeon: Gatha Mayer, MD;  [facility] ENDOSCOPY;  Service: Endoscopy;  Laterality: N/A;  . ESOPHAGOGASTRODUODENOSCOPY     multiple   . INSERTION OF MESH  01/29/2012   Procedure: INSERTION OF MESH;  Surgeon: Gwenyth Ober, MD;  Location: [place];  Service: General;  Laterality: N/A;  . NISSEN FUNDOPLICATION    . SPLENECTOMY, TOTAL     nontraumatic rupture  . VENTRAL HERNIA REPAIR  01/29/2012    WITH MESH  . VENTRAL HERNIA REPAIR  01/29/2012   Procedure: HERNIA REPAIR VENTRAL ADULT;  Surgeon: Gwenyth Ober, MD;  Location: [place];  Service: General;  Laterality: N/A;  open recurrent ventral hernia repair with mesh    Prior to Admission medications   Medication Sig Start Date End Date Taking? Authorizing Provider  acetaminophen (TYLENOL) 500 MG tablet Take 1,000 mg by mouth every 6 (six) hours as needed (pain or headaches).    Yes [provider]  albuterol (PROAIR HFA) 108 (90 Base) MCG/ACT inhaler Inhale 2 puffs into the lungs every 6 (six) hours as needed for wheezing or shortness of breath.   Yes [provider]  baclofen (LIORESAL) 10 MG tablet Take 0.5 tablets (5 mg total) by mouth at bedtime as needed for muscle spasms. Patient taking differently: Take 10 mg by mouth at bedtime.  10/25/16  Yes Debbe Odea, MD  carvedilol (COREG) 3.125 MG tablet Take 1 tablet (3.125 mg total) by mouth 2 (two) times daily with a meal. 02/26/16  Yes Amin, Ankit Chirag, MD  clopidogrel (PLAVIX) 75 MG tablet Take 1 tablet (75 mg total) by mouth daily. 02/10/18  Yes Thurnell Lose, MD  ferrous sulfate 325 (65 FE) MG tablet Take 1 tablet (325 mg total) by mouth 2 (two) times daily with a meal. 02/07/18  Yes Thurnell Lose, MD  fluticasone (FLONASE) 50 MCG/ACT nasal spray Place 1 spray into both nostrils every morning.  06/18/14  Yes [provider]  guaiFENesin (MUCINEX) 600 MG 12 hr tablet Take 600-1,200 mg by mouth daily as needed for cough or to loosen phlegm.    Yes [provider]  Melatonin 5 MG TABS Take 5 mg by mouth at bedtime.   Yes [provider]  metFORMIN (GLUCOPHAGE) 1000 MG tablet  Take 1,000 mg by mouth daily with breakfast.   Yes [provider]  mometasone-formoterol (DULERA) 100-5 MCG/ACT AERO Inhale 2 puffs into the lungs 2 (two) times daily. Patient taking differently: Inhale 1 puff into the lungs 2 (two) times daily.  12/12/15  Yes Tanda Rockers, MD  montelukast (SINGULAIR) 10 MG tablet Take 1 tablet (10 mg total) by mouth at bedtime. 10/05/17  Yes Tanda Rockers, MD  psyllium (METAMUCIL) 58.6 % powder Take 1 packet by mouth every morning.    Yes [provider]  rOPINIRole (REQUIP) 0.5 MG  tablet Take 2 mg by mouth at bedtime.    Yes [provider]  simvastatin (ZOCOR) 40 MG tablet Take 1 tablet (40 mg total) by mouth daily. Patient taking differently: Take 40 mg by mouth at bedtime.  02/26/16  Yes Amin, Ankit Chirag, MD  pantoprazole (PROTONIX) 40 MG tablet Take 1 tablet (40 mg total) by mouth daily. 02/07/18   Thurnell Lose, MD  zolpidem (AMBIEN) 10 MG tablet Take 5-10 mg by mouth at bedtime as needed for sleep.     [provider]    Scheduled Meds: . montelukast  10 mg Oral QHS  . pantoprazole (PROTONIX) IV  40 mg Intravenous Q24H  . rOPINIRole  2 mg Oral QHS   Infusions:  PRN Meds: ipratropium-albuterol, ondansetron **OR** ondansetron (ZOFRAN) IV, zolpidem   Allergies as of 04/21/2018 - Review Complete 04/21/2018  Allergen Reaction Noted  . Tape Other (See Comments) 02/04/2018  . Azithromycin Rash and Other (See Comments) 03/08/2015    Family History  Problem Relation Age of Onset  . Alzheimer's disease Mother   . Breast cancer Mother   . Throat cancer Father     Social History   Socioeconomic History  . Marital status: Married    Spouse name: [name]  . Number of children: 2  . Years of education: Bachelor's  . Highest education level: Not on file  Occupational History  . Occupation: retired  Scientific laboratory technician  . Financial resource strain: Not on file  . Food insecurity:    Worry: Not on file     Inability: Not on file  . Transportation needs:    Medical: Not on file    Non-medical: Not on file  Tobacco Use  . Smoking status: Never Smoker  . Smokeless tobacco: Never Used  Substance and Sexual Activity  . Alcohol use: Yes    Alcohol/week: 1.0 standard drinks    Types: 1 Glasses of wine per week    Comment: daily  . Drug use: No  . Sexual activity: Not on file  Lifestyle  . Physical activity:    Days per week: Not on file    Minutes per session: Not on file  . Stress: Not on file  Relationships  . Social connections:    Talks on phone: Not on file    Gets together: Not on file    Attends religious service: Not on file    Active member of club or organization: Not on file    Attends meetings of clubs or organizations: Not on file    Relationship status: Not on file  . Intimate partner violence:    Fear of current or ex partner: Not on file    Emotionally abused: Not on file    Physically abused: Not on file    Forced sexual activity: Not on file  Other Topics Concern  . Not on file  Social History Narrative   Patient is married and has 2 children.   Patient is right handed.   Patient has Bachelor's degree.   Patient drinks 2 cups daily.    REVIEW OF SYSTEMS: Constitutional: Patient feels well.  No weakness or fatigue. ENT:  No nose bleeds Pulm: No shortness of breath or cough. CV:  No palpitations, no LE edema.  No chest pain.   GU:  No hematuria, no frequency GI: Appetite good.  No nausea vomiting.  No dysphagia.  No heartburn.  See HPI for other details. Heme: No unusual or excessive bleeding.  Bruises  easily but has no large hematomas. Transfusions: Received PRBCs in 11/2016 and 01/2018.   Neuro:  No headaches, no peripheral tingling or numbness Derm:  No itching, no rash or sores.  Endocrine:  No sweats or chills.  No polyuria or dysuria Immunization: Current on flu vaccination. Travel:  None beyond local counties in last few months.    PHYSICAL  EXAM: Vital signs in last 24 hours: Vitals:   04/22/18 0151 04/22/18 0552  BP:  112/84  Pulse:  86  Resp:  18  Temp:  98.1 F (36.7 C)  SpO2: 96% 94%   Wt Readings from Last 3 Encounters:  04/21/18 71.7 kg  03/29/18 72.1 kg  02/07/18 67.5 kg    General: Patient looks elderly, somewhat frail but alert and comfortable.  Not acutely ill-appearing Head: Facial asymmetry.  No signs of head trauma. Eyes: No conjunctival pallor.  EOMI. Ears: Not hard of hearing Nose: Congestion or discharge. Mouth: Oropharynx moist, pink, clear.  Tongue midline.  Smile symmetric. Neck: No JVD, no masses, no thyromegaly. Lungs: Clear bilaterally.  Not short of breath.  No cough. Heart: RRR.  NSR in the 80s on telemetry.  Swan, S2 audible. Abdomen: Soft.  Ventral/umbilical hernia is nontender.  Active bowel sounds.  No HSM, masses, bruits..   Rectal: Deferred rectal exam.  DRE per ED physician last night showed scant maroon-colored stool on the glove. Musc/Skeltl: No gross joint deformities, redness or swelling. Extremities: CCE.  Feet are warm and well-perfused. Neurologic: Alert and oriented x3.  Moves all 4 limbs, strength not tested.  No tremors.  No gross deficits. Skin: No suspicious lesions, no rash, no sores. Tattoos: None observed. Nodes: No cervical adenopathy. Psych: Pleasant, calm, fluid speech.  Appropriate.  Intake/Output from previous day: 03/05 0701 - 03/06 0700 In: 0  Out: 500 [Urine:500] Intake/Output this shift: No intake/output data recorded.  LAB RESULTS: Recent Labs    04/21/18 2150 04/22/18 0505  WBC 9.7  --   HGB 10.1* 9.0*  HCT 35.7*  --   PLT 423*  --    BMET Lab Results  Component Value Date   NA 137 04/21/2018   NA 140 02/07/2018   NA 141 02/06/2018   K 3.9 04/21/2018   K 3.5 02/07/2018   K 3.6 02/06/2018   CL 111 04/21/2018   CL 108 02/07/2018   CL 112 (H) 02/06/2018   CO2 22 04/21/2018   CO2 27 02/07/2018   CO2 24 02/06/2018   GLUCOSE 135 (H)  04/21/2018   GLUCOSE 102 (H) 02/07/2018   GLUCOSE 106 (H) 02/06/2018   BUN 14 04/21/2018   BUN 11 02/07/2018   BUN 15 02/06/2018   CREATININE 0.67 04/21/2018   CREATININE 0.88 02/07/2018   CREATININE 0.77 02/06/2018   CALCIUM 7.7 (L) 04/21/2018   CALCIUM 7.9 (L) 02/07/2018   CALCIUM 8.0 (L) 02/06/2018   LFT Recent Labs    04/21/18 2150  PROT 5.9*  ALBUMIN 3.3*  AST 16  ALT 12  ALKPHOS 62  BILITOT 0.1*   PT/INR Lab Results  Component Value Date   INR 1.0 04/21/2018   INR 1.06 02/04/2018   INR 1.07 10/24/2016    RADIOLOGY STUDIES: No results found.   IMPRESSION:   *   Painless hematochezia, recurrent.  In pt on chronic Plavix.  Hx recurrent LGIB presumed diverticular.   *   Hx recurrent CVAs and a fib.  On chronic Plavix.    PLAN:     *  Nuclear medicine tagged red blood cell bleeding scan ordered.  Spoke with tach in nuc med, study will be performed sometime this morning.  Patient does not need to be n.p.o. for this so wrote for clear liquids.  *    CBC every 12 hours, next at 5 PM tonight.  *    Discontinued IV Protonix as this is not an upper GI bleed.   Resume his daily regimen of Protonix 40 mg p.o./day.    Azucena Freed  04/22/2018, 8:07 AM Phone [phone]

## 2018-04-22 NOTE — ED Notes (Signed)
ED TO INPATIENT HANDOFF REPORT  ED Nurse Name and Phone #:  Jess F [phone]  S Name/Age/Gender [name] 85 y.o. male Room/Bed: 030C/030C  Code Status   Code Status: DNR  Home/SNF/Other Home Patient oriented to: self, place, time and situation Is this baseline? Yes   Triage Complete: Triage complete  Chief Complaint rectal bleeding  Triage Note Pt arrives via EMS after having 3 episodes of rectal bleeding. Pt with hx of diverticulitis. Told by Dr Ardis Hughs with [facility] to come in if he had another episode of rectal bleeding to have a "bleeding scan". 142/90, HR 105, RR 20, 93% RA, CBG 205.    Allergies Allergies  Allergen Reactions  . Tape Other (See Comments)    SKIN IS SENSITIVE; PLEASE USE PAPER TAPE; SKIN BRUISES AND TEARS EASILY!!  . Azithromycin Rash and Other (See Comments)    Possible reaction to Z-Pak    Level of Care/Admitting Diagnosis ED Disposition    ED Disposition Condition [facility] Area: [place] [[id number]]  Level of Care: Medical Telemetry [104]  Diagnosis: Lower GI bleed [638756]  Admitting Physician: Reubin Milan [[id number]]  Attending Physician: Reubin Milan [[id number]]  Estimated length of stay: inpatient only procedure  Certification:: I certify this patient will need inpatient services for at least 2 midnights  PT Class (Do Not Modify): Inpatient [101]  PT Acc Code (Do Not Modify): Private [1]       B Medical/Surgery History Past Medical History:  Diagnosis Date  . Acute CVA (cerebrovascular accident) ([place]) 07/25/2015  . Asthma   . Benign essential HTN   . Cerebral infarction due to embolism of left middle cerebral artery ([place]) 02/03/2014  . Chronic combined systolic and diastolic CHF (congestive heart failure) ([place]) 12/23/2017  . Colitis   . Colon polyps   . Diabetes mellitus type 2, diet-controlled ([place]) 12/22/2013  . Diverticulosis   . GERD (gastroesophageal reflux disease)   . GI bleed    . Hemorrhoids   . Hiatal hernia   . History of esophageal strciture   . HLD (hyperlipidemia)   . Osteoarthritis   . Proctitis   . Status post dilation of esophageal narrowing   . Stroke [facility])    Past Surgical History:  Procedure Laterality Date  . ABDOMINAL HERNIA REPAIR    . COLONOSCOPY     multiple  . COLONOSCOPY WITH PROPOFOL N/A 12/12/2016   Procedure: COLONOSCOPY WITH PROPOFOL;  Surgeon: Gatha Mayer, MD;  Location: [facility] ENDOSCOPY;  Service: Endoscopy;  Laterality: N/A;  . ESOPHAGOGASTRODUODENOSCOPY     multiple  . INSERTION OF MESH  01/29/2012   Procedure: INSERTION OF MESH;  Surgeon: Gwenyth Ober, MD;  Location: [place];  Service: General;  Laterality: N/A;  . NISSEN FUNDOPLICATION    . SPLENECTOMY, TOTAL     nontraumatic rupture  . VENTRAL HERNIA REPAIR  01/29/2012    WITH MESH  . VENTRAL HERNIA REPAIR  01/29/2012   Procedure: HERNIA REPAIR VENTRAL ADULT;  Surgeon: Gwenyth Ober, MD;  Location: [place];  Service: General;  Laterality: N/A;  open recurrent ventral hernia repair with mesh     A IV Location/Drains/Wounds Patient Lines/Drains/Airways Status   Active Line/Drains/Airways    Name:   Placement date:   Placement time:   Site:   Days:   Peripheral IV 02/04/18 Left Forearm   02/04/18    1903    Forearm   77   Peripheral IV  02/04/18 Right Antecubital   02/04/18    1903    Antecubital   77          Intake/Output Last 24 hours No intake or output data in the 24 hours ending 04/22/18 0014  Labs/Imaging Results for orders placed or performed during the hospital encounter of 04/21/18 (from the past 48 hour(s))  Comprehensive metabolic panel     Status: Abnormal   Collection Time: 04/21/18  9:50 PM  Result Value Ref Range   Sodium 137 135 - 145 mmol/L   Potassium 3.9 3.5 - 5.1 mmol/L   Chloride 111 98 - 111 mmol/L   CO2 22 22 - 32 mmol/L   Glucose, Bld 135 (H) 70 - 99 mg/dL   BUN 14 8 - 23 mg/dL   Creatinine, Ser 0.67 0.61 - 1.24 mg/dL   Calcium 7.7 (L)  8.9 - 10.3 mg/dL   Total Protein 5.9 (L) 6.5 - 8.1 g/dL   Albumin 3.3 (L) 3.5 - 5.0 g/dL   AST 16 15 - 41 U/L   ALT 12 0 - 44 U/L   Alkaline Phosphatase 62 38 - 126 U/L   Total Bilirubin 0.1 (L) 0.3 - 1.2 mg/dL   GFR calc non Af Amer >60 >60 mL/min   GFR calc Af Amer >60 >60 mL/min   Anion gap 4 (L) 5 - 15    Comment: Performed at [facility], [address], [place], [place] [address]  CBC with Differential     Status: Abnormal   Collection Time: 04/21/18  9:50 PM  Result Value Ref Range   WBC 9.7 4.0 - 10.5 K/uL   RBC 4.14 (L) 4.22 - 5.81 MIL/uL   Hemoglobin 10.1 (L) 13.0 - 17.0 g/dL   HCT 35.7 (L) 39.0 - 52.0 %   MCV 86.2 80.0 - 100.0 fL   MCH 24.4 (L) 26.0 - 34.0 pg   MCHC 28.3 (L) 30.0 - 36.0 g/dL   RDW 20.5 (H) 11.5 - 15.5 %   Platelets 423 (H) 150 - 400 K/uL   nRBC 0.0 0.0 - 0.2 %   Neutrophils Relative % 59 %   Neutro Abs 5.7 1.7 - 7.7 K/uL   Lymphocytes Relative 17 %   Lymphs Abs 1.6 0.7 - 4.0 K/uL   Monocytes Relative 10 %   Monocytes Absolute 1.0 0.1 - 1.0 K/uL   Eosinophils Relative 13 %   Eosinophils Absolute 1.3 (H) 0.0 - 0.5 K/uL   Basophils Relative 1 %   Basophils Absolute 0.1 0.0 - 0.1 K/uL   Immature Granulocytes 0 %   Abs Immature Granulocytes 0.03 0.00 - 0.07 K/uL    Comment: Performed at [facility], [place] [address], [place], [place] [address]  Protime-INR     Status: None   Collection Time: 04/21/18  9:50 PM  Result Value Ref Range   Prothrombin Time 13.4 11.4 - 15.2 seconds   INR 1.0 0.8 - 1.2    Comment: (NOTE) INR goal varies based on device and disease states. Performed at [facility], [place] [address], [place], [place] [address]   APTT     Status: None   Collection Time: 04/21/18  9:50 PM  Result Value Ref Range   aPTT 27 24 - 36 seconds    Comment: Performed at [place] [address] [place], [place] [address]  Type and screen [place]     Status: None   Collection  Time: 04/21/18 10:30  PM  Result Value Ref Range   ABO/RH(D) O POS    Antibody Screen NEG    Sample Expiration      04/24/2018 Performed at [facility], [place] [address], [place], [place] [address]   Urinalysis, Routine w reflex microscopic     Status: Abnormal   Collection Time: 04/21/18 11:20 PM  Result Value Ref Range   Color, Urine STRAW (A) YELLOW   APPearance CLEAR CLEAR   Specific Gravity, Urine 1.013 1.005 - 1.030   pH 6.0 5.0 - 8.0   Glucose, UA NEGATIVE NEGATIVE mg/dL   Hgb urine dipstick NEGATIVE NEGATIVE   Bilirubin Urine NEGATIVE NEGATIVE   Ketones, ur NEGATIVE NEGATIVE mg/dL   Protein, ur NEGATIVE NEGATIVE mg/dL   Nitrite NEGATIVE NEGATIVE   Leukocytes,Ua NEGATIVE NEGATIVE    Comment: Performed at [facility] [address], [place],  [address]   No results found.  Pending Labs Unresulted Labs (From admission, onward)    Start     Ordered   04/22/18 1000  CBC WITH DIFFERENTIAL  Daily,   R     04/21/18 2339   04/22/18 0400  Hemoglobin  Now then every 12 hours,   R     04/21/18 2339   04/21/18 2150  Urine culture  ONCE - STAT,   STAT     04/21/18 2149          Vitals/Pain Today's Vitals   04/21/18 2125 04/21/18 2134 04/21/18 2200 04/21/18 2353  BP:  (!) 142/82 128/85   Pulse:  99 98   Resp:  (!) 24 20   Temp:  98.4 F (36.9 C)    TempSrc:  Oral    SpO2:  95% 95%   Weight: 71.7 kg     Height: 5\' 10"  (1.778 m)     PainSc: 0-No pain   0-No pain    Isolation Precautions No active isolations  Medications Medications  ondansetron (ZOFRAN) tablet 4 mg (has no administration in time range)    Or  ondansetron (ZOFRAN) injection 4 mg (has no administration in time range)  pantoprazole (PROTONIX) injection 40 mg (has no administration in time range)    Mobility walks Low fall risk   Focused Assessments Gi bleed    R Recommendations: See Admitting Provider Note  Report given to:   Additional Notes: n/a

## 2018-04-22 NOTE — Progress Notes (Signed)
PROGRESS NOTE  [name] WUJ:[id number] DOB: 1931/07/06 DOA: 04/21/2018 PCP: Seward Carol, MD  HPI/Recap of past 24 hours:  Reports last bm an hours ago, bleeding has slowed down Denies ab pain, no n/v, no fever  Assessment/Plan: Principal Problem:   Lower GI bleed Active Problems:   Diabetes mellitus type II, non insulin dependent (HCC)   HLD (hyperlipidemia)   Benign essential HTN   Chronic combined systolic and diastolic CHF (congestive heart failure) (HCC)   Acute blood loss anemia   GERD (gastroesophageal reflux disease)  Recurrent low gi bleed with acute blood loss anemia -Hold plavix ( last taken on 3/5), monitor hgb -plan per gi, bleeding scan  Paroxysmal atrial fibrillation  Mali vas 2 score of 6. Not on anticoagulation due to previous GI bleed history,  continue Coreg, currently in sinus with few pvc's  Chronic combined systolic and diastolic CHF  Last echo in January 2018 shows a EF of 45% and grade 1 diastolic dysfunction. Currently compensated.  continue Coreg with holding parameters. Monitor clinically.  Dyslipidemia On statin.  History of CVA. Plavix currently on hold due to recurrent gi bleed, continue statin for secondary prevention.  noninsulin dependent DM type II.  Last a1c 6.2 in 2018. Am blood glucose 135, home meds metformin held for now  H/o Asthma: no wheezing, continue home meds   Code Status: DNR  Family Communication: patient   Disposition Plan:  Not ready to discharge   Consultants:  GI  Procedures:  Bleeding scan  Antibiotics:  none   Objective: BP 129/80 (BP Location: Left Arm)   Pulse 89   Temp (!) 97.3 F (36.3 C) (Oral)   Resp 20   Ht 5\' 10"  (1.778 m)   Wt 71.7 kg   SpO2 94%   BMI 22.67 kg/m   Intake/Output Summary (Last 24 hours) at 04/22/2018 0944 Last data filed at 04/22/2018 0855 Gross per 24 hour  Intake 0 ml  Output 700 ml  Net -700 ml   Filed Weights   04/21/18 2125  Weight:  71.7 kg    Exam: Patient is examined daily including today on 04/22/2018, exams remain the same as of yesterday except that has changed    General:  NAD  Cardiovascular: RRR  Respiratory: CTABL  Abdomen: Soft/ND/NT, positive BS  Musculoskeletal: No Edema  Neuro: alert, oriented   Data Reviewed: Basic Metabolic Panel: Recent Labs  Lab 04/21/18 2150  NA 137  K 3.9  CL 111  CO2 22  GLUCOSE 135*  BUN 14  CREATININE 0.67  CALCIUM 7.7*   Liver Function Tests: Recent Labs  Lab 04/21/18 2150  AST 16  ALT 12  ALKPHOS 62  BILITOT 0.1*  PROT 5.9*  ALBUMIN 3.3*   No results for input(s): LIPASE, AMYLASE in the last 168 hours. No results for input(s): AMMONIA in the last 168 hours. CBC: Recent Labs  Lab 04/21/18 2150 04/22/18 0505  WBC 9.7  --   NEUTROABS 5.7  --   HGB 10.1* 9.0*  HCT 35.7*  --   MCV 86.2  --   PLT 423*  --    Cardiac Enzymes:   No results for input(s): CKTOTAL, CKMB, CKMBINDEX, TROPONINI in the last 168 hours. BNP (last 3 results) No results for input(s): BNP in the last 8760 hours.  ProBNP (last 3 results) No results for input(s): PROBNP in the last 8760 hours.  CBG: No results for input(s): GLUCAP in the last 168 hours.  No results found  for this or any previous visit (from the past 240 hour(s)).   Studies: No results found.  Scheduled Meds: . carvedilol  3.125 mg Oral BID WC  . montelukast  10 mg Oral QHS  . rOPINIRole  2 mg Oral QHS    Continuous Infusions:   Time spent: 35 mins, case discussed with gi PA  I have personally reviewed and interpreted on  04/22/2018 daily labs, tele strips, imagings as discussed above under date review session and assessment and plans.  I reviewed all nursing notes, pharmacy notes, consultant notes,  vitals, pertinent old records  I have discussed plan of care as described above with RN , patient  on 04/22/2018   Florencia Reasons MD, PhD  [facility] Pager [phone]. If 7PM-7AM, please contact  night-coverage at [email or web address], password [facility] 04/22/2018, 9:44 AM  LOS: 1 day

## 2018-04-23 ENCOUNTER — Encounter (HOSPITAL_COMMUNITY): Payer: Self-pay | Admitting: Emergency Medicine

## 2018-04-23 ENCOUNTER — Observation Stay (HOSPITAL_COMMUNITY)
Admission: EM | Admit: 2018-04-23 | Discharge: 2018-04-24 | Disposition: A | Discharge status: Home / Self Care | Payer: Medicare HMO | Attending: Internal Medicine | Admitting: Internal Medicine

## 2018-04-23 ENCOUNTER — Emergency Department (HOSPITAL_COMMUNITY): Payer: Medicare HMO

## 2018-04-23 DIAGNOSIS — Z7902 Long term (current) use of antithrombotics/antiplatelets: Secondary | ICD-10-CM

## 2018-04-23 DIAGNOSIS — E119 Type 2 diabetes mellitus without complications: Secondary | ICD-10-CM | POA: Diagnosis not present

## 2018-04-23 DIAGNOSIS — I671 Cerebral aneurysm, nonruptured: Secondary | ICD-10-CM | POA: Diagnosis not present

## 2018-04-23 DIAGNOSIS — R4781 Slurred speech: Secondary | ICD-10-CM | POA: Diagnosis not present

## 2018-04-23 DIAGNOSIS — I110 Hypertensive heart disease with heart failure: Secondary | ICD-10-CM | POA: Insufficient documentation

## 2018-04-23 DIAGNOSIS — Z888 Allergy status to other drugs, medicaments and biological substances status: Secondary | ICD-10-CM | POA: Insufficient documentation

## 2018-04-23 DIAGNOSIS — Z794 Long term (current) use of insulin: Secondary | ICD-10-CM | POA: Diagnosis not present

## 2018-04-23 DIAGNOSIS — I700 Atherosclerosis of aorta: Secondary | ICD-10-CM | POA: Insufficient documentation

## 2018-04-23 DIAGNOSIS — Z7982 Long term (current) use of aspirin: Secondary | ICD-10-CM | POA: Insufficient documentation

## 2018-04-23 DIAGNOSIS — I639 Cerebral infarction, unspecified: Secondary | ICD-10-CM

## 2018-04-23 DIAGNOSIS — I10 Essential (primary) hypertension: Secondary | ICD-10-CM | POA: Diagnosis present

## 2018-04-23 DIAGNOSIS — G459 Transient cerebral ischemic attack, unspecified: Secondary | ICD-10-CM | POA: Diagnosis not present

## 2018-04-23 DIAGNOSIS — I5042 Chronic combined systolic (congestive) and diastolic (congestive) heart failure: Secondary | ICD-10-CM | POA: Insufficient documentation

## 2018-04-23 DIAGNOSIS — J449 Chronic obstructive pulmonary disease, unspecified: Secondary | ICD-10-CM | POA: Diagnosis not present

## 2018-04-23 DIAGNOSIS — Z881 Allergy status to other antibiotic agents status: Secondary | ICD-10-CM | POA: Insufficient documentation

## 2018-04-23 DIAGNOSIS — Z79899 Other long term (current) drug therapy: Secondary | ICD-10-CM | POA: Insufficient documentation

## 2018-04-23 DIAGNOSIS — G40919 Epilepsy, unspecified, intractable, without status epilepticus: Secondary | ICD-10-CM | POA: Diagnosis present

## 2018-04-23 DIAGNOSIS — R4701 Aphasia: Secondary | ICD-10-CM

## 2018-04-23 DIAGNOSIS — E871 Hypo-osmolality and hyponatremia: Secondary | ICD-10-CM | POA: Insufficient documentation

## 2018-04-23 DIAGNOSIS — Z8673 Personal history of transient ischemic attack (TIA), and cerebral infarction without residual deficits: Secondary | ICD-10-CM | POA: Diagnosis not present

## 2018-04-23 DIAGNOSIS — I672 Cerebral atherosclerosis: Secondary | ICD-10-CM | POA: Diagnosis not present

## 2018-04-23 DIAGNOSIS — G9389 Other specified disorders of brain: Secondary | ICD-10-CM | POA: Insufficient documentation

## 2018-04-23 DIAGNOSIS — K922 Gastrointestinal hemorrhage, unspecified: Secondary | ICD-10-CM | POA: Diagnosis present

## 2018-04-23 DIAGNOSIS — K5731 Diverticulosis of large intestine without perforation or abscess with bleeding: Principal | ICD-10-CM

## 2018-04-23 LAB — DIFFERENTIAL
Abs Immature Granulocytes: 0.04 10*3/uL (ref 0.00–0.07)
Basophils Absolute: 0.1 10*3/uL (ref 0.0–0.1)
Basophils Relative: 1 %
Eosinophils Absolute: 1.3 10*3/uL — ABNORMAL HIGH (ref 0.0–0.5)
Eosinophils Relative: 11 %
Immature Granulocytes: 0 %
Lymphocytes Relative: 21 %
Lymphs Abs: 2.4 10*3/uL (ref 0.7–4.0)
Monocytes Absolute: 1.4 10*3/uL — ABNORMAL HIGH (ref 0.1–1.0)
Monocytes Relative: 12 %
Neutro Abs: 6.4 10*3/uL (ref 1.7–7.7)
Neutrophils Relative %: 55 %

## 2018-04-23 LAB — CBC
HCT: 28.8 % — ABNORMAL LOW (ref 39.0–52.0)
HEMATOCRIT: 31.8 % — AB (ref 39.0–52.0)
Hemoglobin: 8.9 g/dL — ABNORMAL LOW (ref 13.0–17.0)
Hemoglobin: 9.3 g/dL — ABNORMAL LOW (ref 13.0–17.0)
MCH: 25.3 pg — AB (ref 26.0–34.0)
MCH: 24.7 pg — ABNORMAL LOW (ref 26.0–34.0)
MCHC: 30.9 g/dL (ref 30.0–36.0)
MCHC: 29.2 g/dL — ABNORMAL LOW (ref 30.0–36.0)
MCV: 81.8 fL (ref 80.0–100.0)
MCV: 84.4 fL (ref 80.0–100.0)
PLATELETS: 402 10*3/uL — AB (ref 150–400)
Platelets: 424 10*3/uL — ABNORMAL HIGH (ref 150–400)
RBC: 3.52 MIL/uL — AB (ref 4.22–5.81)
RBC: 3.77 MIL/uL — ABNORMAL LOW (ref 4.22–5.81)
RDW: 20.4 % — ABNORMAL HIGH (ref 11.5–15.5)
RDW: 20.2 % — ABNORMAL HIGH (ref 11.5–15.5)
WBC: 9.2 10*3/uL (ref 4.0–10.5)
WBC: 11.7 10*3/uL — ABNORMAL HIGH (ref 4.0–10.5)
nRBC: 0 % (ref 0.0–0.2)
nRBC: 0 % (ref 0.0–0.2)

## 2018-04-23 LAB — COMPREHENSIVE METABOLIC PANEL
ALBUMIN: 3.5 g/dL (ref 3.5–5.0)
ALT: 12 U/L (ref 0–44)
AST: 18 U/L (ref 15–41)
Alkaline Phosphatase: 60 U/L (ref 38–126)
Anion gap: 8 (ref 5–15)
BUN: 15 mg/dL (ref 8–23)
CO2: 22 mmol/L (ref 22–32)
Calcium: 8.3 mg/dL — ABNORMAL LOW (ref 8.9–10.3)
Chloride: 100 mmol/L (ref 98–111)
Creatinine, Ser: 0.75 mg/dL (ref 0.61–1.24)
GFR calc Af Amer: >60 mL/min (ref >60)
GFR calc non Af Amer: >60 mL/min (ref >60)
GLUCOSE: 98 mg/dL (ref 70–99)
Potassium: 3.8 mmol/L (ref 3.5–5.1)
SODIUM: 130 mmol/L — AB (ref 135–145)
Total Bilirubin: 0.3 mg/dL (ref 0.3–1.2)
Total Protein: 6 g/dL — ABNORMAL LOW (ref 6.5–8.1)

## 2018-04-23 LAB — BASIC METABOLIC PANEL
Anion gap: 6 (ref 5–15)
BUN: 13 mg/dL (ref 8–23)
CO2: 23 mmol/L (ref 22–32)
CREATININE: 0.68 mg/dL (ref 0.61–1.24)
Calcium: 8.3 mg/dL — ABNORMAL LOW (ref 8.9–10.3)
Chloride: 105 mmol/L (ref 98–111)
GFR calc Af Amer: >60 mL/min (ref >60)
GFR calc non Af Amer: >60 mL/min (ref >60)
Glucose, Bld: 126 mg/dL — ABNORMAL HIGH (ref 70–99)
Potassium: 3.5 mmol/L (ref 3.5–5.1)
Sodium: 134 mmol/L — ABNORMAL LOW (ref 135–145)

## 2018-04-23 LAB — URINE CULTURE: Culture: <10000 — AB

## 2018-04-23 LAB — I-STAT CREATININE, ED: Creatinine, Ser: 0.7 mg/dL (ref 0.61–1.24)

## 2018-04-23 LAB — APTT: aPTT: 26 seconds (ref 24–36)

## 2018-04-23 LAB — GLUCOSE, CAPILLARY
Glucose-Capillary: 115 mg/dL — ABNORMAL HIGH (ref 70–99)
Glucose-Capillary: 109 mg/dL — ABNORMAL HIGH (ref 70–99)

## 2018-04-23 LAB — PROTIME-INR
INR: 1 (ref 0.8–1.2)
Prothrombin Time: 13.2 seconds (ref 11.4–15.2)

## 2018-04-23 LAB — MAGNESIUM: Magnesium: 2 mg/dL (ref 1.7–2.4)

## 2018-04-23 MED ORDER — IOHEXOL 350 MG/ML SOLN
100.0000 mL | Freq: Once | INTRAVENOUS | Status: AC | PRN
  Administered 2018-04-23: 100 mL via INTRAVENOUS

## 2018-04-23 MED ORDER — CLOPIDOGREL BISULFATE 75 MG PO TABS: 75.0000 mg | ORAL_TABLET | Freq: Every day | ORAL | Status: DC

## 2018-04-23 MED ORDER — POTASSIUM CHLORIDE CRYS ER 20 MEQ PO TBCR
40.0000 meq | EXTENDED_RELEASE_TABLET | Freq: Once | ORAL | Status: AC
  Administered 2018-04-23: 40 meq via ORAL
  Filled 2018-04-23: qty 2

## 2018-04-23 NOTE — Consult Note (Signed)
Neurology Consultation Reason for Consult: Transient aphasia Referring Physician: Rosalyn Gess  CC: Transient aphasia  History is obtained from: Patient  HPI: [name] is a 85 y.o. male with a history of CVA, diabetes, hypertension who presents with an episode of aphasia that lasted approximately 10-20 minutes.  He was talking with someone else and began having difficulty getting his words out.  He states that he could understand but could not get his words out.  His wife states that the sounds coming out of his mouth were not words, but he seemed to be trying to say something.  This lasted for approximately 10 to 20 minutes and then subsequently resolved.  She did not notice any weakness, facial droop or other problems.  He states that his eyesight might of been slightly fuzzy, but otherwise no other symptoms.  He is currently back to his baseline.  He denies that he was lightheaded during this, denies any difficulty walking or unsteadiness.   LKW: 5:15 PM tpa given?: no, resolution of symptoms    ROS: A 14 point ROS was performed and is negative except as noted in the HPI.   Past Medical History:  Diagnosis Date  . Acute CVA (cerebrovascular accident) ([place]) 07/25/2015  . Asthma   . Benign essential HTN   . Cerebral infarction due to embolism of left middle cerebral artery ([place]) 02/03/2014  . Chronic combined systolic and diastolic CHF (congestive heart failure) ([place]) 12/23/2017  . Colitis   . Colon polyps   . Diabetes mellitus type 2, diet-controlled ([place]) 12/22/2013  . Diverticulosis   . GERD (gastroesophageal reflux disease)   . GI bleed   . Hemorrhoids   . Hiatal hernia   . History of esophageal strciture   . HLD (hyperlipidemia)   . Osteoarthritis   . Proctitis   . Status post dilation of esophageal narrowing   . Stroke [facility])      Family History  Problem Relation Age of Onset  . Alzheimer's disease Mother   . Breast cancer Mother   . Throat cancer Father       Social History:  reports that he has never smoked. He has never used smokeless tobacco. He reports current alcohol use of about 1.0 standard drinks of alcohol per week. He reports that he does not use drugs.   Exam: Current vital signs: BP 123/69   Pulse 83   Temp 98 F (36.7 C) (Oral)   Resp (!) 24   Ht 5\' 10"  (1.778 m)   Wt 71.7 kg   SpO2 97%   BMI 22.67 kg/m  Vital signs in last 24 hours: Temp:  [97.6 F (36.4 C)-98 F (36.7 C)] 98 F (36.7 C) (03/07 1825) Pulse Rate:  [78-90] 83 (03/07 1845) Resp:  [16-26] 24 (03/07 2215) BP: (115-167)/(50-95) 123/69 (03/07 2245) SpO2:  [93 %-97 %] 97 % (03/07 1845) Weight:  [71.7 kg] 71.7 kg (03/07 1824)   Physical Exam  Constitutional: Appears well-developed and well-nourished.  Psych: Affect appropriate to situation Eyes: No scleral injection HENT: No OP obstrucion Head: Normocephalic.  Cardiovascular: Normal rate and regular rhythm.  Respiratory: Effort normal, non-labored breathing GI: Soft.  No distension. There is no tenderness.  Skin: WDI  Neuro: Mental Status: Patient is awake, alert, oriented to person, place, month, year, and situation. Patient is able to give a clear and coherent history. No signs of aphasia or neglect Cranial Nerves: II: Visual Fields are full. Pupils are equal, round, and reactive to light.  III,IV, VI: EOMI without ptosis or diploplia.  V: Facial sensation is symmetric to temperature VII: Facial movement is symmetric.  VIII: hearing is intact to voice X: Uvula elevates symmetrically XI: Shoulder shrug is symmetric. XII: tongue is midline without atrophy or fasciculations.  Motor: Tone is normal. Bulk is normal. 5/5 strength was present on the right side, 5-/5 on the left with impaired fine motor movements. Sensory: Sensation is symmetric to light touch and temperature in the arms and legs. Cerebellar: He has impaired finger-nose-finger and heel-knee-shin on the left.  I have  reviewed labs in epic and the results pertinent to this consultation are: Sodium-130  I have reviewed the images obtained: CT head-negative  Impression: 85 year old male with transient aphasia in the setting of recently stopping antiplatelet therapy due to GI bleeding.  The description of the episode does sound like possible transient ischemic attack, but unfortunately in the setting of recent GI bleeding I would be hesitant to start antiplatelet therapies at this time.  I would favor evaluation for other possible interventions that could help lower his secondary stroke risk.  Recommendations: - HgbA1c, fasting lipid panel - MRI of the brain without contrast - Frequent neuro checks - Echocardiogram -CTA head and neck - Prophylactic therapy-consider discussing with GI restarting low-dose aspirin sooner  - Risk factor modification - Telemetry monitoring - PT consult, OT consult, Speech consult - Stroke team to follow    Roland Rack, MD [facility] [phone]  If 7pm- 7am, please page neurology on call as listed in [place].

## 2018-04-23 NOTE — ED Notes (Signed)
Patient transported to CT 

## 2018-04-23 NOTE — Discharge Summary (Signed)
Discharge Summary  [name] EHM:[id number] DOB: 12-Mar-1931  PCP: Seward Carol, MD  Admit date: 04/21/2018 Discharge date: 04/23/2018  Time spent: 54mins. Case discussed with GI.  Recommendations for Outpatient Follow-up:  1. F/u with PCP within a week  for hospital discharge follow up, repeat cbc/bmp at follow up. 2. F/u with GI  Discharge Diagnoses:  Active Hospital Problems   Diagnosis Date Noted  . Lower GI bleed 10/24/2016  . Rectal bleeding   . Diverticulosis large intestine w/o perforation or abscess w/bleeding   . GERD (gastroesophageal reflux disease) 04/21/2018  . Acute blood loss anemia 12/24/2017  . Chronic combined systolic and diastolic CHF (congestive heart failure) ([place]) 12/23/2017  . Benign essential HTN   . HLD (hyperlipidemia) 02/03/2014  . Diabetes mellitus type II, non insulin dependent ([place]) 12/22/2013    Resolved Hospital Problems  No resolved problems to display.    Discharge Condition: stable  Diet recommendation: heart healthy/carb modified, soft diet for a few days ,advance to regular diet as tolerated  Filed Weights   04/21/18 2125  Weight: 71.7 kg    History of present illness: (per admitting MD Dr Olevia Bowens) PCP: Seward Carol, MD   Patient coming from: Home.  I have personally briefly reviewed patient's old medical records in [place]  Chief Complaint: Rectal bleeding.  HPI: [name] is a 85 y.o. male with medical history significant of acute CVA, asthma, hypertension, embolic CVA of left middle cerebral artery, chronic systolic and diastolic CHF, history of colitis and colon polyps, type 2 diabetes, diverticulosis, GERD, hiatal hernia, history of esophageal stricture, hyperlipidemia, osteoarthritis, proctitis, history of multiple lower GI bleeds who is coming to the emergency department after he had 3 bloody bowel movements after dinner.  He spoke to his gastroenterologist who recommended for him to go to the emergency  department NM bleeding scan.  He denies nausea, emesis, but states he has had melena or hematochezia.  Fever, chills, sore throat, dyspnea, chest pain, palpitations, dizziness, diaphoresis, orthopnea or recent pitting edema of the lower extremities.  No dysuria, frequency or hematuria.  No polyuria, polydipsia or polyphagia.  ED Course: Initial vital signs temperature 98.4 F, pulse 99, respirations 24, blood pressure 142/82 mmHg and O2 sat 95% on room air.  His urinalysis was unremarkable.  CBC showed a white count was 9.7, hemoglobin 10.1 g/dL and platelets 423.  PT was 13.4, INR 1.0 and APTT 27.  CMP shows normal electrolytes except for calcium of 7.7 mg/dL.  Glucose 135, BUN 14 and creatinine 0.67 mg/dL.  Total protein was 5.9 and albumin 3.3 g/dL.  The rest of the LFTs were within normal limits.  Hospital Course:  Principal Problem:   Lower GI bleed Active Problems:   Diabetes mellitus type II, non insulin dependent (H[facility])   HLD (hyperlipidemia)   Benign essential HTN   Chronic combined systolic and diastolic CHF (congestive heart failure) (H[facility])   Acute blood loss anemia   GERD (gastroesophageal reflux disease)   Rectal bleeding   Diverticulosis large intestine w/o perforation or abscess w/bleeding   Recurrent low gi bleed with acute blood loss anemia, likely recurrent diverticular bleed -plavix held in the hospital ( last taken on 3/5) -bleeding scan no active bleeding -gi bleed spontaneously stopped, hgb leveled off , stable around 9 -no plan to scope, he is cleared to discharge home per GI recommendation, GI recommend to continue hold plavix for a week.   Paroxysmal atrial fibrillation  Mali vas 2 score  of 6. Not on anticoagulation due to previous GI bleed history,  continue Coreg, currently in sinus with few pvc's  Chronic combined systolic and diastolic CHF  Last echo in January 2018 shows a EF of 45% and grade 1 diastolic dysfunction. Currently compensated.   continue Coreg with holding parameters. Monitor clinically.  Dyslipidemia On statin.  History of CVA. hold Plavix for a week per gi recommendation continue statin for secondary prevention. He is followed by neurology  noninsulin dependent DM type II.  Last a1c 6.2 in 2018. Am blood glucose 135, home meds metformin held in the hospital ,resumed at discharge   H/o Asthma: no wheezing, continue home meds   Code Status: DNR  Family Communication: patient and wife at bedside  Disposition Plan:  d/c home with GI clearance    Consultants:  GI  Procedures:  Bleeding scan  Antibiotics:  none   Discharge Exam: BP 132/87 (BP Location: Right Arm)   Pulse 78   Temp 97.7 F (36.5 C) (Oral)   Resp 18   Ht 5\' 10"  (1.778 m)   Wt 71.7 kg   SpO2 97%   BMI 22.67 kg/m   General: NAD Cardiovascular: RRR Respiratory: CTABL  Discharge Instructions You were cared for by a hospitalist during your hospital stay. If you have any questions about your discharge medications or the care you received while you were in the hospital after you are discharged, you can call the unit and asked to speak with the hospitalist on call if the hospitalist that took care of you is not available. Once you are discharged, your primary care physician will handle any further medical issues. Please note that NO REFILLS for any discharge medications will be authorized once you are discharged, as it is imperative that you return to your primary care physician (or establish a relationship with a primary care physician if you do not have one) for your aftercare needs so that they can reassess your need for medications and monitor your lab values.  Discharge Instructions    Diet - low sodium heart healthy   Complete by:  As directed    Carb modified , soft diet for a few days ,gradually advance to regular diet.   Increase activity slowly   Complete by:  As directed      Allergies as of  04/23/2018      Reactions   Tape Other (See Comments)   SKIN IS SENSITIVE; PLEASE USE PAPER TAPE; SKIN BRUISES AND TEARS EASILY!!   Azithromycin Rash, Other (See Comments)   Pt had a ZPak Jan 2020 - no reaction      Medication List    STOP taking these medications   pantoprazole 40 MG tablet Commonly known as:  Protonix     TAKE these medications   acetaminophen 500 MG tablet Commonly known as:  TYLENOL Take 1,000 mg by mouth every 6 (six) hours as needed (pain or headaches).   baclofen 10 MG tablet Commonly known as:  LIORESAL Take 0.5 tablets (5 mg total) by mouth at bedtime as needed for muscle spasms. What changed:    how much to take  when to take this   carvedilol 3.125 MG tablet Commonly known as:  COREG Take 1 tablet (3.125 mg total) by mouth 2 (two) times daily with a meal.   clopidogrel 75 MG tablet Commonly known as:  PLAVIX Take 1 tablet (75 mg total) by mouth daily. Hold plavix for a week, then restart Start taking on:  April 30, 2018 What changed:    additional instructions  These instructions start on April 30, 2018. If you are unsure what to do until then, ask your doctor or other care provider.   diphenhydramine-acetaminophen 25-500 MG Tabs tablet Commonly known as:  TYLENOL PM Take 2 tablets by mouth at bedtime.   ferrous sulfate 325 (65 FE) MG tablet Take 1 tablet (325 mg total) by mouth 2 (two) times daily with a meal.   fluticasone 50 MCG/ACT nasal spray Commonly known as:  FLONASE Place 1 spray into both nostrils every morning.   guaiFENesin 600 MG 12 hr tablet Commonly known as:  MUCINEX Take 600-1,200 mg by mouth daily as needed for cough or to loosen phlegm.   Melatonin 5 MG Tabs Take 5 mg by mouth at bedtime.   metFORMIN 1000 MG tablet Commonly known as:  GLUCOPHAGE Take 1,000 mg by mouth daily with breakfast.   mometasone-formoterol 100-5 MCG/ACT Aero Commonly known as:  DULERA Inhale 2 puffs into the lungs 2 (two) times  daily. What changed:  how much to take   montelukast 10 MG tablet Commonly known as:  SINGULAIR Take 1 tablet (10 mg total) by mouth at bedtime.   polyethylene glycol packet Commonly known as:  MIRALAX / GLYCOLAX Take 17 g by mouth daily.   ProAir HFA 108 (90 Base) MCG/ACT inhaler Generic drug:  albuterol Inhale 2 puffs into the lungs every 6 (six) hours as needed for wheezing or shortness of breath.   psyllium 58.6 % powder Commonly known as:  METAMUCIL Take 1 packet by mouth every morning.   rOPINIRole 0.5 MG tablet Commonly known as:  REQUIP Take 2 mg by mouth at bedtime.   simvastatin 40 MG tablet Commonly known as:  ZOCOR Take 1 tablet (40 mg total) by mouth daily. What changed:  when to take this   zolpidem 10 MG tablet Commonly known as:  AMBIEN Take 5-10 mg by mouth at bedtime as needed for sleep.      Allergies  Allergen Reactions  . Tape Other (See Comments)    SKIN IS SENSITIVE; PLEASE USE PAPER TAPE; SKIN BRUISES AND TEARS EASILY!!  . Azithromycin Rash and Other (See Comments)    Pt had a ZPak Jan 2020 - no reaction   Follow-up Information    Seward Carol, MD Follow up in 1 week(s).   Specialty:  Internal Medicine Why:  hospital discharge follow up, repeat cbc/bmp at follow up. Contact information: [address] [place] [address] [phone]        follow up with GI Follow up.            The results of significant diagnostics from this hospitalization (including imaging, microbiology, ancillary and laboratory) are listed below for reference.    Significant Diagnostic Studies: Nm Gi Blood Loss  Result Date: 04/22/2018 CLINICAL DATA:  GI bleed or blood in stool, history of stroke, hypertension, asthma, CHF EXAM: NUCLEAR MEDICINE GASTROINTESTINAL BLEEDING SCAN TECHNIQUE: Sequential abdominal images were obtained following intravenous administration of Tc-57m labeled red blood cells. RADIOPHARMACEUTICALS:  26 mCi Tc-60m  pertechnetate in-vitro labeled red cells. COMPARISON:  CT abdomen and pelvis 02/04/2018 FINDINGS: Patient motion artifacts throughout exam. Normal blood pool distribution of tracer. Excretion of de labeled tracer into urinary bladder. A poorly defined area/blush of tracer is identified in the LEFT mid abdomen throughout the exam, showing no peristaltic change in configuration to suggest passage of blood throughout the GI tract. Based on prior  CT exam this would roughly correspond to small-bowel loops or the distal descending colon and could potentially represent an area of relative inflammation/hyperemia in the LEFT mid abdomen. No definite site of active gastrointestinal hemorrhage is identified. IMPRESSION: Poorly defined area/blush of tracer in the LEFT mid abdomen throughout the study, configuration stable throughout exam, not demonstrating peristaltic redistribution during the 2 hours of imaging. This favors an area of hyperemia in the LEFT mid abdomen either at small bowel or the distal descending colon. Consider CTA imaging of the abdomen for further assessment. Electronically Signed   By: Lavonia Dana M.D.   On: 04/22/2018 14:47    Microbiology: Recent Results (from the past 240 hour(s))  Urine culture     Status: Abnormal   Collection Time: 04/21/18 11:28 PM  Result Value Ref Range Status   Specimen Description URINE, RANDOM  Final   Special Requests NONE  Final   Culture (A)  Final    <10,000 COLONIES/mL INSIGNIFICANT GROWTH Performed at [facility], [address], [place], [place] [address]    Report Status 04/23/2018 FINAL  Final     Labs: Basic Metabolic Panel: Recent Labs  Lab 04/21/18 2150 04/23/18 0441  NA 137 134*  K 3.9 3.5  CL 111 105  CO2 22 23  GLUCOSE 135* 126*  BUN 14 13  CREATININE 0.67 0.68  CALCIUM 7.7* 8.3*  MG  --  2.0   Liver Function Tests: Recent Labs  Lab 04/21/18 2150  AST 16  ALT 12  ALKPHOS 62  BILITOT 0.1*  PROT 5.9*  ALBUMIN 3.3*     No results for input(s): LIPASE, AMYLASE in the last 168 hours. No results for input(s): AMMONIA in the last 168 hours. CBC: Recent Labs  Lab 04/21/18 2150 04/22/18 0505 04/22/18 1557 04/23/18 0441  WBC 9.7  --  9.8 9.2  NEUTROABS 5.7  --   --   --   HGB 10.1* 9.0* 8.9* 8.9*  HCT 35.7*  --  29.9* 28.8*  MCV 86.2  --  81.7 81.8  PLT 423*  --  406* 402*   Cardiac Enzymes: No results for input(s): CKTOTAL, CKMB, CKMBINDEX, TROPONINI in the last 168 hours. BNP: BNP (last 3 results) No results for input(s): BNP in the last 8760 hours.  ProBNP (last 3 results) No results for input(s): PROBNP in the last 8760 hours.  CBG: Recent Labs  Lab 04/22/18 1615 04/23/18 0721 04/23/18 1147  GLUCAP 121* 115* 109*       Signed:  Florencia Reasons MD, PhD  [facility] 04/23/2018, 12:36 PM

## 2018-04-23 NOTE — ED Triage Notes (Signed)
Pt arrives via EMS from home with slurred speech that started at 17:15, speech normal when EMS arrived at 17:30. Pt was discharged today from hospital after admission for diverticulitis.  Pt has hx of CVA.

## 2018-04-23 NOTE — Progress Notes (Addendum)
Progress Note   Subjective  Chief Complaint: Hematochezia, suspected diverticular bleed  Today, patient reports that he is doing well, no further bloody bowel movements, he is asking when he can go home.  Does ask about results of bleeding scan.  Denies any other complaints.   Objective   Vital signs in last 24 hours: Temp:  [97.6 F (36.4 C)-97.9 F (36.6 C)] 97.7 F (36.5 C) (03/07 0842) Pulse Rate:  [78-90] 78 (03/07 0842) Resp:  [16-20] 18 (03/07 0842) BP: (104-132)/(66-87) 132/87 (03/07 0842) SpO2:  [93 %-97 %] 97 % (03/07 0842) Last BM Date: 04/22/18 General:    Elderly white male in NAD Heart:  Regular rate and rhythm; no murmurs Lungs: Respirations even and unlabored, lungs CTA bilaterally Abdomen:  Soft, nontender and nondistended. Normal bowel sounds. Extremities:  Without edema. Neurologic:  Alert and oriented,  grossly normal neurologically. Psych:  Cooperative. Normal mood and affect.  Intake/Output from previous day: 03/06 0701 - 03/07 0700 In: 720 [P.O.:720] Out: 980 [Urine:980] Intake/Output this shift: Total I/O In: 220 [P.O.:220] Out: -   Lab Results: Recent Labs    04/21/18 2150 04/22/18 0505 04/22/18 1557 04/23/18 0441  WBC 9.7  --  9.8 9.2  HGB 10.1* 9.0* 8.9* 8.9*  HCT 35.7*  --  29.9* 28.8*  PLT 423*  --  406* 402*   BMET Recent Labs    04/21/18 2150 04/23/18 0441  NA 137 134*  K 3.9 3.5  CL 111 105  CO2 22 23  GLUCOSE 135* 126*  BUN 14 13  CREATININE 0.67 0.68  CALCIUM 7.7* 8.3*   LFT Recent Labs    04/21/18 2150  PROT 5.9*  ALBUMIN 3.3*  AST 16  ALT 12  ALKPHOS 62  BILITOT 0.1*   PT/INR Recent Labs    04/21/18 2150  LABPROT 13.4  INR 1.0    Studies/Results: Nm Gi Blood Loss  Result Date: 04/22/2018 CLINICAL DATA:  GI bleed or blood in stool, history of stroke, hypertension, asthma, CHF EXAM: NUCLEAR MEDICINE GASTROINTESTINAL BLEEDING SCAN TECHNIQUE: Sequential abdominal images were obtained following  intravenous administration of Tc-57m labeled red blood cells. RADIOPHARMACEUTICALS:  26 mCi Tc-67m pertechnetate in-vitro labeled red cells. COMPARISON:  CT abdomen and pelvis 02/04/2018 FINDINGS: Patient motion artifacts throughout exam. Normal blood pool distribution of tracer. Excretion of de labeled tracer into urinary bladder. A poorly defined area/blush of tracer is identified in the LEFT mid abdomen throughout the exam, showing no peristaltic change in configuration to suggest passage of blood throughout the GI tract. Based on prior CT exam this would roughly correspond to small-bowel loops or the distal descending colon and could potentially represent an area of relative inflammation/hyperemia in the LEFT mid abdomen. No definite site of active gastrointestinal hemorrhage is identified. IMPRESSION: Poorly defined area/blush of tracer in the LEFT mid abdomen throughout the study, configuration stable throughout exam, not demonstrating peristaltic redistribution during the 2 hours of imaging. This favors an area of hyperemia in the LEFT mid abdomen either at small bowel or the distal descending colon. Consider CTA imaging of the abdomen for further assessment. Electronically Signed   By: Lavonia Dana M.D.   On: 04/22/2018 14:47     Assessment / Plan:   Assessment: 1.  Painless hematochezia: Likely diverticular again, bleeding has slowed overnight GI bleeding scan negative, hemoglobin stable 2.  History of recurrent CVAs and A. fib: On Plavix, which has been held  Plan: 1.  Discussed that nuclear medicine red  blood cell scan was negative.  Patient's bleeding has stopped overnight and hemoglobin stable. 2.  Patient is okay to advance diet to soft for the next 2 to 3 days, then regular. 3.  Will discuss Plavix with Dr. Henrene Pastor.  No need for follow-up in GI office, but will need follow-up with PCP, patient tells me he has this scheduled on the 16th, will need repeat hemoglobin at that time.  We will sign  off.   LOS: 2 days   Levin Erp  04/23/2018, 12:06 PM  GI ATTENDING  Interval history data reviewed.  Agree with interval progress note as outlined above.  No further GI bleeding.  Presumed mild self-limited diverticular disease.  Okay for regular diet and stable for discharge.  May restart Plavix in 48 hours.  Will sign off.  Docia Chuck. Geri [place]., M.D. [facility] Division of Gastroenterology

## 2018-04-23 NOTE — ED Provider Notes (Signed)
[facility] EMERGENCY DEPARTMENT Provider Note   CSN: [id number] Arrival date & time: 04/23/18  1820    History   Chief Complaint Chief Complaint  Patient presents with  . Aphasia    HPI [name] is a 85 y.o. male.     Patient with history of stroke with mild left-sided deficits, heart failure, GI bleeding, recent admission and discharge today for monitoring of GI bleed presents after speech difficulty at home that resolved.  Started approximately 515 and lasted 20 minutes today.  Currently no new signs or symptoms.  Patient is currently been off Plavix since Thursday due to GI bleed.     Past Medical History:  Diagnosis Date  . Acute CVA (cerebrovascular accident) ([place]) 07/25/2015  . Asthma   . Benign essential HTN   . Cerebral infarction due to embolism of left middle cerebral artery ([place]) 02/03/2014  . Chronic combined systolic and diastolic CHF (congestive heart failure) ([place]) 12/23/2017  . Colitis   . Colon polyps   . Diabetes mellitus type 2, diet-controlled ([place]) 12/22/2013  . Diverticulosis   . GERD (gastroesophageal reflux disease)   . GI bleed   . Hemorrhoids   . Hiatal hernia   . History of esophageal strciture   . HLD (hyperlipidemia)   . Osteoarthritis   . Proctitis   . Status post dilation of esophageal narrowing   . Stroke [facility])     Patient Active Problem List   Diagnosis Date Noted  . Platelet inhibition due to Plavix   . Rectal bleeding   . Diverticulosis large intestine w/o perforation or abscess w/bleeding   . GERD (gastroesophageal reflux disease) 04/21/2018  . Hematochezia   . Hypokalemia 12/25/2017  . Diverticulosis   . Acute blood loss anemia 12/24/2017  . Syncope due to orthostatic hypotension 12/23/2017  . Chronic combined systolic and diastolic CHF (congestive heart failure) ([place]) 12/23/2017  . GI bleed 10/25/2016  . BPPV (benign paroxysmal positional vertigo) 10/25/2016  . Lower GI bleed 10/24/2016  . Syncope  10/24/2016  . TIA (transient ischemic attack) 07/22/2016  . Benign essential HTN   . PAF (paroxysmal atrial fibrillation) ([place])   . Stroke-like symptom 02/22/2016  . Leukocytosis 02/22/2016  . Acute lower GI bleeding 02/01/2016  . History of lower GI bleeding Dec 2017 02/01/2016  . Chronic anticoagulation 2/2 history of CVA 01/31/2016  . History of CVA (cerebrovascular accident) 01/31/2016  . Sinusitis, chronic 12/12/2015  . Cough variant asthma 10/31/2015  . Insomnia 07/25/2015  . HLD (hyperlipidemia) 02/03/2014  . Ulnar neuropathy at elbow of right upper extremity 02/03/2014  . Cervical radiculopathy 02/03/2014  . Seizures ([place])   . Diabetes mellitus type II, non insulin dependent ([place]) 12/22/2013  . Recurrent ventral hernia 12/15/2011  . History of esophageal strciture   . Diverticulosis of colon with hemorrhage 06/24/2007    Past Surgical History:  Procedure Laterality Date  . ABDOMINAL HERNIA REPAIR    . COLONOSCOPY     multiple  . COLONOSCOPY WITH PROPOFOL N/A 12/12/2016   Procedure: COLONOSCOPY WITH PROPOFOL;  Surgeon: Gatha Mayer, MD;  Location[facility] ENDOSCOPY;  Service: Endoscopy;  Laterality: N/A;  . ESOPHAGOGASTRODUODENOSCOPY     multiple  . INSERTION OF MESH  01/29/2012   Procedure: INSERTION OF MESH;  Surgeon: Gwenyth Ober, MD;  Location: [facility];  Service: General;  Laterality: N/A;  . NISSEN FUNDOPLICATION    . SPLENECTOMY, TOTAL     nontraumatic rupture  . VENTRAL HERNIA REPAIR  01/29/2012    WITH MESH  . VENTRAL HERNIA REPAIR  01/29/2012   Procedure: HERNIA REPAIR VENTRAL ADULT;  Surgeon: Gwenyth Ober, MD;  Location: [facility];  Service: General;  Laterality: N/A;  open recurrent ventral hernia repair with mesh        Home Medications    Prior to Admission medications   Medication Sig Start Date End Date Taking? Authorizing Provider  acetaminophen (TYLENOL) 500 MG tablet Take 1,000 mg by mouth every 6 (six) hours as needed (pain or headaches).      [provider]  albuterol (PROAIR HFA) 108 (90 Base) MCG/ACT inhaler Inhale 2 puffs into the lungs every 6 (six) hours as needed for wheezing or shortness of breath.    [provider]  baclofen (LIORESAL) 10 MG tablet Take 0.5 tablets (5 mg total) by mouth at bedtime as needed for muscle spasms. Patient taking differently: Take 10 mg by mouth at bedtime.  10/25/16   Debbe Odea, MD  carvedilol (COREG) 3.125 MG tablet Take 1 tablet (3.125 mg total) by mouth 2 (two) times daily with a meal. 02/26/16   Amin, Jeanella Flattery, MD  clopidogrel (PLAVIX) 75 MG tablet Take 1 tablet (75 mg total) by mouth daily. Hold plavix for a week, then restart 04/30/18   Florencia Reasons, MD  diphenhydramine-acetaminophen (TYLENOL PM) 25-500 MG TABS tablet Take 2 tablets by mouth at bedtime.    [provider]  ferrous sulfate 325 (65 FE) MG tablet Take 1 tablet (325 mg total) by mouth 2 (two) times daily with a meal. 02/07/18   Thurnell Lose, MD  fluticasone (FLONASE) 50 MCG/ACT nasal spray Place 1 spray into both nostrils every morning.  06/18/14   [provider]  guaiFENesin (MUCINEX) 600 MG 12 hr tablet Take 600-1,200 mg by mouth daily as needed for cough or to loosen phlegm.     [provider]  Melatonin 5 MG TABS Take 5 mg by mouth at bedtime.    [provider]  metFORMIN (GLUCOPHAGE) 1000 MG tablet Take 1,000 mg by mouth daily with breakfast.    [provider]  mometasone-formoterol (DULERA) 100-5 MCG/ACT AERO Inhale 2 puffs into the lungs 2 (two) times daily. Patient taking differently: Inhale 1 puff into the lungs 2 (two) times daily.  12/12/15   Tanda Rockers, MD  montelukast (SINGULAIR) 10 MG tablet Take 1 tablet (10 mg total) by mouth at bedtime. 10/05/17   Tanda Rockers, MD  polyethylene glycol (MIRALAX / GLYCOLAX) packet Take 17 g by mouth daily.    [provider]  psyllium (METAMUCIL) 58.6 % powder Take 1 packet by mouth every morning.      [provider]  rOPINIRole (REQUIP) 0.5 MG tablet Take 2 mg by mouth at bedtime.     [provider]  simvastatin (ZOCOR) 40 MG tablet Take 1 tablet (40 mg total) by mouth daily. Patient taking differently: Take 40 mg by mouth at bedtime.  02/26/16   Amin, Jeanella Flattery, MD  zolpidem (AMBIEN) 10 MG tablet Take 5-10 mg by mouth at bedtime as needed for sleep.     [provider]    Family History Family History  Problem Relation Age of Onset  . Alzheimer's disease Mother   . Breast cancer Mother   . Throat cancer Father     Social History Social History   Tobacco Use  . Smoking status: Never Smoker  . Smokeless tobacco: Never Used  Substance Use  Topics  . Alcohol use: Yes    Alcohol/week: 1.0 standard drinks    Types: 1 Glasses of wine per week    Comment: daily  . Drug use: No     Allergies   Tape and Azithromycin   Review of Systems Review of Systems  Constitutional: Negative for chills and fever.  HENT: Negative for congestion.   Eyes: Negative for visual disturbance.  Respiratory: Negative for shortness of breath.   Cardiovascular: Negative for chest pain.  Gastrointestinal: Negative for abdominal pain and vomiting.  Genitourinary: Negative for dysuria and flank pain.  Musculoskeletal: Negative for back pain, neck pain and neck stiffness.  Skin: Negative for rash.  Neurological: Positive for speech difficulty. Negative for syncope, weakness, light-headedness, numbness and headaches.     Physical Exam Updated Vital Signs BP (!) 156/95   Pulse 83   Temp 98 F (36.7 C) (Oral)   Resp (!) 26   Ht 5\' 10"  (1.778 m)   Wt 71.7 kg   SpO2 97%   BMI 22.67 kg/m   Physical Exam Vitals signs and nursing note reviewed.  Constitutional:      Appearance: He is well-developed.  HENT:     Head: Normocephalic and atraumatic.  Eyes:     General:        Right eye: No discharge.        Left eye: No discharge.     Conjunctiva/sclera:  Conjunctivae normal.  Neck:     Musculoskeletal: Normal range of motion and neck supple.     Trachea: No tracheal deviation.  Cardiovascular:     Rate and Rhythm: Normal rate and regular rhythm.  Pulmonary:     Effort: Pulmonary effort is normal.     Breath sounds: Normal breath sounds.  Abdominal:     General: There is no distension.     Palpations: Abdomen is soft.     Tenderness: There is no abdominal tenderness. There is no guarding.  Musculoskeletal:        General: No swelling.  Skin:    General: Skin is warm.     Findings: No rash.  Neurological:     Mental Status: He is alert and oriented to person, place, and time.     GCS: GCS eye subscore is 4. GCS verbal subscore is 5. GCS motor subscore is 6.     Comments: 5+ strength in UE and LE with f/e at major joints. Sensation to palpation intact in UE and LE. CNs 2-12 grossly intact.  EOMFI.  PERRL.   Finger nose and coordination intact bilateral.   Visual fields intact to finger testing. No nystagmus       ED Treatments / Results  Labs (all labs ordered are listed, but only abnormal results are displayed) Labs Reviewed  CBC - Abnormal; Notable for the following components:      Result Value   WBC 11.7 (*)    RBC 3.77 (*)    Hemoglobin 9.3 (*)    HCT 31.8 (*)    MCH 24.7 (*)    MCHC 29.2 (*)    RDW 20.2 (*)    Platelets 424 (*)    All other components within normal limits  DIFFERENTIAL - Abnormal; Notable for the following components:   Monocytes Absolute 1.4 (*)    Eosinophils Absolute 1.3 (*)    All other components within normal limits  COMPREHENSIVE METABOLIC PANEL - Abnormal; Notable for the following components:   Sodium 130 (*)  Calcium 8.3 (*)    Total Protein 6.0 (*)    All other components within normal limits  PROTIME-INR  APTT  I-STAT CREATININE, ED    EKG None  Radiology Ct Head Wo Contrast  Result Date: 04/23/2018 CLINICAL DATA:  Slurred speech EXAM: CT HEAD WITHOUT CONTRAST  TECHNIQUE: Contiguous axial images were obtained from the base of the skull through the vertex without intravenous contrast. COMPARISON:  12/23/2017 FINDINGS: Brain: No evidence of acute infarction, hemorrhage, hydrocephalus, extra-axial collection or mass lesion/mass effect. Periventricular white matter hypodensity and global volume loss. Vascular: No hyperdense vessel or unexpected calcification. Skull: Normal. Negative for fracture or focal lesion. Sinuses/Orbits: No acute finding. Mucosal thickening in the bilateral maxillary sinuses status post antrostomy. Other: None. IMPRESSION: No acute intracranial pathology. Small-vessel white matter disease and global volume loss. Electronically Signed   By: Eddie Candle M.D.   On: 04/23/2018 19:47   Nm Gi Blood Loss  Result Date: 04/22/2018 CLINICAL DATA:  GI bleed or blood in stool, history of stroke, hypertension, asthma, CHF EXAM: NUCLEAR MEDICINE GASTROINTESTINAL BLEEDING SCAN TECHNIQUE: Sequential abdominal images were obtained following intravenous administration of Tc-61m labeled red blood cells. RADIOPHARMACEUTICALS:  26 mCi Tc-53m pertechnetate in-vitro labeled red cells. COMPARISON:  CT abdomen and pelvis 02/04/2018 FINDINGS: Patient motion artifacts throughout exam. Normal blood pool distribution of tracer. Excretion of de labeled tracer into urinary bladder. A poorly defined area/blush of tracer is identified in the LEFT mid abdomen throughout the exam, showing no peristaltic change in configuration to suggest passage of blood throughout the GI tract. Based on prior CT exam this would roughly correspond to small-bowel loops or the distal descending colon and could potentially represent an area of relative inflammation/hyperemia in the LEFT mid abdomen. No definite site of active gastrointestinal hemorrhage is identified. IMPRESSION: Poorly defined area/blush of tracer in the LEFT mid abdomen throughout the study, configuration stable throughout exam, not  demonstrating peristaltic redistribution during the 2 hours of imaging. This favors an area of hyperemia in the LEFT mid abdomen either at small bowel or the distal descending colon. Consider CTA imaging of the abdomen for further assessment. Electronically Signed   By: Lavonia Dana M.D.   On: 04/22/2018 14:47    Procedures Procedures (including critical care time)  Medications Ordered in ED Medications - No data to display   Initial Impression / Assessment and Plan / ED Course  I have reviewed the triage vital signs and the nursing notes.  Pertinent labs & imaging results that were available during my care of the patient were reviewed by me and considered in my medical decision making (see chart for details).       Patient presents with clinical concern for TIA.  Patient had 20-minute episode of expressive aphasia that resolved.  Patient is high risk given stroke history and has been off Plavix due to GI bleed.  Screening blood work obtained and reviewed overall unremarkable hemoglobin 9.3.  EKG reviewed no acute findings.  CT scan of the head obtained to look for any bleeding.  Neurology consulted for recommendations on either close outpatient follow-up versus observation in the hospital.  Discussed with hospitalist for observation.  Likely plan to speak with gastroenterology to discuss risk and benefits of aspirin or other medicines.  Unfortunately patient has had bleeding with other medicines in the past.  The patients results and plan were reviewed and discussed.   Any x-rays performed were independently reviewed by myself.   Differential diagnosis were considered with  the presenting HPI.  Medications - No data to display  Vitals:   04/23/18 2045 04/23/18 2100 04/23/18 2115 04/23/18 2130  BP: 127/71 (!) 147/76 133/78 (!) 156/95  Pulse:      Resp: (!) 21 19 18  (!) 26  Temp:      TempSrc:      SpO2:      Weight:      Height:        Final diagnoses:  TIA (transient ischemic  attack)  Expressive aphasia    Admission/ observation were discussed with the admitting physician, patient and/or family and they are comfortable with the plan.    Final Clinical Impressions(s) / ED Diagnoses   Final diagnoses:  TIA (transient ischemic attack)  Expressive aphasia    ED Discharge Orders    None       Elnora Morrison, MD 04/23/18 2214

## 2018-04-23 NOTE — Progress Notes (Signed)
Discharged to home, AVS reviewed with Patient and his wife. All questions were answered. No new prescriptions. IV removed and telebox returned

## 2018-04-24 ENCOUNTER — Observation Stay (HOSPITAL_COMMUNITY): Payer: Medicare HMO

## 2018-04-24 DIAGNOSIS — R4701 Aphasia: Secondary | ICD-10-CM | POA: Diagnosis not present

## 2018-04-24 DIAGNOSIS — I639 Cerebral infarction, unspecified: Secondary | ICD-10-CM | POA: Diagnosis not present

## 2018-04-24 DIAGNOSIS — E119 Type 2 diabetes mellitus without complications: Secondary | ICD-10-CM | POA: Diagnosis not present

## 2018-04-24 DIAGNOSIS — I10 Essential (primary) hypertension: Secondary | ICD-10-CM

## 2018-04-24 DIAGNOSIS — I5042 Chronic combined systolic (congestive) and diastolic (congestive) heart failure: Secondary | ICD-10-CM | POA: Diagnosis not present

## 2018-04-24 DIAGNOSIS — K5791 Diverticulosis of intestine, part unspecified, without perforation or abscess with bleeding: Secondary | ICD-10-CM | POA: Diagnosis not present

## 2018-04-24 LAB — IRON AND TIBC
Iron: 7 ug/dL — ABNORMAL LOW (ref 45–182)
Saturation Ratios: 2 % — ABNORMAL LOW (ref 17.9–39.5)
TIBC: 393 ug/dL (ref 250–450)
UIBC: 386 ug/dL

## 2018-04-24 LAB — HEMOGLOBIN A1C
Hgb A1c MFr Bld: 5.9 % — ABNORMAL HIGH (ref 4.8–5.6)
Mean Plasma Glucose: 122.63 mg/dL

## 2018-04-24 LAB — FERRITIN: Ferritin: 17 ng/mL — ABNORMAL LOW (ref 24–336)

## 2018-04-24 LAB — VITAMIN B12: Vitamin B-12: 196 pg/mL (ref 180–914)

## 2018-04-24 LAB — LIPID PANEL
Cholesterol: 111 mg/dL (ref 0–200)
HDL: 44 mg/dL (ref >40)
LDL Cholesterol: 54 mg/dL (ref 0–99)
Total CHOL/HDL Ratio: 2.5 RATIO
Triglycerides: 65 mg/dL (ref <150)
VLDL: 13 mg/dL (ref 0–40)

## 2018-04-24 LAB — CBG MONITORING, ED
Glucose-Capillary: 173 mg/dL — ABNORMAL HIGH (ref 70–99)
Glucose-Capillary: 85 mg/dL (ref 70–99)

## 2018-04-24 LAB — TSH: TSH: 2.07 u[IU]/mL (ref 0.350–4.500)

## 2018-04-24 MED ORDER — SIMVASTATIN 20 MG PO TABS: 40.0000 mg | ORAL_TABLET | Freq: Every day | ORAL | Status: DC

## 2018-04-24 MED ORDER — GUAIFENESIN ER 600 MG PO TB12: 600.0000 mg | ORAL_TABLET | Freq: Every day | ORAL | Status: DC | PRN

## 2018-04-24 MED ORDER — ACETAMINOPHEN 160 MG/5ML PO SOLN: 650.0000 mg | ORAL | Status: DC | PRN

## 2018-04-24 MED ORDER — STROKE: EARLY STAGES OF RECOVERY BOOK: Freq: Once | Status: DC

## 2018-04-24 MED ORDER — DOXYCYCLINE HYCLATE 50 MG PO CAPS: 100.0000 mg | ORAL_CAPSULE | Freq: Two times a day (BID) | ORAL | 0 refills | Status: DC

## 2018-04-24 MED ORDER — ROPINIROLE HCL 1 MG PO TABS
2.0000 mg | ORAL_TABLET | Freq: Every day | ORAL | Status: DC
  Administered 2018-04-24: 2 mg via ORAL
  Filled 2018-04-24: qty 2

## 2018-04-24 MED ORDER — MOMETASONE FURO-FORMOTEROL FUM 100-5 MCG/ACT IN AERO
1.0000 | INHALATION_SPRAY | Freq: Two times a day (BID) | RESPIRATORY_TRACT | Status: DC
  Administered 2018-04-24: 1 via RESPIRATORY_TRACT
  Filled 2018-04-24: qty 8.8

## 2018-04-24 MED ORDER — MONTELUKAST SODIUM 10 MG PO TABS
10.0000 mg | ORAL_TABLET | Freq: Every day | ORAL | Status: DC
  Filled 2018-04-24: qty 1

## 2018-04-24 MED ORDER — ACETAMINOPHEN 325 MG PO TABS: 650.0000 mg | ORAL_TABLET | ORAL | Status: DC | PRN

## 2018-04-24 MED ORDER — FERROUS SULFATE 325 (65 FE) MG PO TABS
325.0000 mg | ORAL_TABLET | Freq: Two times a day (BID) | ORAL | Status: DC
  Administered 2018-04-24: 325 mg via ORAL
  Filled 2018-04-24: qty 1

## 2018-04-24 MED ORDER — ASPIRIN 81 MG PO TBEC: 81.0000 mg | DELAYED_RELEASE_TABLET | Freq: Every day | ORAL | 0 refills | Status: DC

## 2018-04-24 MED ORDER — FLUTICASONE PROPIONATE 50 MCG/ACT NA SUSP
1.0000 | Freq: Every morning | NASAL | Status: DC
  Administered 2018-04-24: 1 via NASAL
  Filled 2018-04-24: qty 16

## 2018-04-24 MED ORDER — ZOLPIDEM TARTRATE 5 MG PO TABS: 5.0000 mg | ORAL_TABLET | Freq: Every evening | ORAL | Status: DC | PRN

## 2018-04-24 MED ORDER — CARVEDILOL 3.125 MG PO TABS
3.1250 mg | ORAL_TABLET | Freq: Two times a day (BID) | ORAL | Status: DC
  Filled 2018-04-24: qty 1

## 2018-04-24 MED ORDER — INSULIN ASPART 100 UNIT/ML ~~LOC~~ SOLN
0.0000 [IU] | Freq: Three times a day (TID) | SUBCUTANEOUS | Status: DC
  Administered 2018-04-24: 3 [IU] via SUBCUTANEOUS

## 2018-04-24 MED ORDER — ALBUTEROL SULFATE (2.5 MG/3ML) 0.083% IN NEBU: 3.0000 mL | INHALATION_SOLUTION | Freq: Four times a day (QID) | RESPIRATORY_TRACT | Status: DC | PRN

## 2018-04-24 MED ORDER — ACETAMINOPHEN 650 MG RE SUPP: 650.0000 mg | RECTAL | Status: DC | PRN

## 2018-04-24 MED ORDER — PREDNISONE 20 MG PO TABS: 20.0000 mg | ORAL_TABLET | Freq: Two times a day (BID) | ORAL | 0 refills | Status: DC

## 2018-04-24 MED ORDER — POLYETHYLENE GLYCOL 3350 17 G PO PACK
17.0000 g | PACK | Freq: Every day | ORAL | Status: DC
  Administered 2018-04-24: 17 g via ORAL
  Filled 2018-04-24: qty 1

## 2018-04-24 MED ORDER — BACLOFEN 10 MG PO TABS
10.0000 mg | ORAL_TABLET | Freq: Every evening | ORAL | Status: DC | PRN
  Filled 2018-04-24: qty 1

## 2018-04-24 MED ORDER — DIPHENHYDRAMINE HCL 25 MG PO CAPS
50.0000 mg | ORAL_CAPSULE | Freq: Every day | ORAL | Status: DC
  Administered 2018-04-24: 50 mg via ORAL
  Filled 2018-04-24: qty 2

## 2018-04-24 MED ORDER — ASPIRIN EC 81 MG PO TBEC
81.0000 mg | DELAYED_RELEASE_TABLET | Freq: Every day | ORAL | Status: DC
  Administered 2018-04-24: 81 mg via ORAL
  Filled 2018-04-24: qty 1

## 2018-04-24 MED ORDER — CLOPIDOGREL BISULFATE 75 MG PO TABS
75.0000 mg | ORAL_TABLET | Freq: Every day | ORAL | Status: DC
  Filled 2018-04-24: qty 1

## 2018-04-24 NOTE — ED Notes (Signed)
Pt has contacted living facility for transport home

## 2018-04-24 NOTE — H&P (Addendum)
History and Physical    [name] MEQ:[id number] DOB: [birth year] DOA: 04/23/2018  PCP: Seward Carol, MD  Patient coming from: Home  I have personally briefly reviewed patient's old medical records in [place]  Chief Complaint: Transient aphasia  HPI: [name] is a 85 y.o. male with medical history significant of L MCA stroke, recurrent diverticular bleeds.  Patient has been on Plavix for stroke prevention.  But was just taken off of plavix on 3/5 after he presented to the ED (and was admitted to our service) for hematochezia.  Ultimately again felt to be due to diverticular bleeding.  He was discharged just earlier today on 3/7.  After getting home, he had episode of aphasia that lasted about 10-20 mins.  He was talking with someone else and began having difficulty getting his words out.  He states that he could understand but could not get his words out.  His wife states that the sounds coming out of his mouth were not words, but he seemed to be trying to say something.  This lasted for approximately 10 to 20 minutes and then subsequently resolved.  She did not notice any weakness, facial droop or other problems.  He states that his eyesight might of been slightly fuzzy, but otherwise no other symptoms.  He is currently back to his baseline.   ED Course: Symptoms remained resolved on presentation to ED and remains symptom free.   Review of Systems: As per HPI otherwise 10 point review of systems negative.   Past Medical History:  Diagnosis Date  . Acute CVA (cerebrovascular accident) ([place]) 07/25/2015  . Asthma   . Benign essential HTN   . Cerebral infarction due to embolism of left middle cerebral artery ([place]) 02/03/2014  . Chronic combined systolic and diastolic CHF (congestive heart failure) ([place]) 12/23/2017  . Colitis   . Colon polyps   . Diabetes mellitus type 2, diet-controlled ([place]) 12/22/2013  . Diverticulosis   . GERD (gastroesophageal reflux disease)   . GI  bleed   . Hemorrhoids   . Hiatal hernia   . History of esophageal strciture   . HLD (hyperlipidemia)   . Osteoarthritis   . Proctitis   . Status post dilation of esophageal narrowing   . Stroke [facility])     Past Surgical History:  Procedure Laterality Date  . ABDOMINAL HERNIA REPAIR    . COLONOSCOPY     multiple  . COLONOSCOPY WITH PROPOFOL N/A 12/12/2016   Procedure: COLONOSCOPY WITH PROPOFOL;  Surgeon: Gatha Mayer, MD;  Location: [facility]: [facility];  Service: Endoscopy;  Laterality: N/A;  . ESOPHAGOGASTRODUODENOSCOPY     multiple  . INSERTION OF MESH  01/29/2012   Procedure: INSERTION OF MESH;  Surgeon: Gwenyth Ober, MD;  Location: [place];  Service: General;  Laterality: N/A;  . NISSEN FUNDOPLICATION    . SPLENECTOMY, TOTAL     nontraumatic rupture  . VENTRAL HERNIA REPAIR  01/29/2012    WITH MESH  . VENTRAL HERNIA REPAIR  01/29/2012   Procedure: HERNIA REPAIR VENTRAL ADULT;  Surgeon: Gwenyth Ober, MD;  Location: [place];  Service: General;  Laterality: N/A;  open recurrent ventral hernia repair with mesh     reports that he has never smoked. He has never used smokeless tobacco. He reports current alcohol use of about 1.0 standard drinks of alcohol per week. He reports that he does not use drugs.  Allergies  Allergen Reactions  . Tape Other (See Comments)  SKIN IS SENSITIVE; PLEASE USE PAPER TAPE; SKIN BRUISES AND TEARS EASILY!!  . Azithromycin Rash and Other (See Comments)    Pt had a ZPak Jan 2020 - no reaction    Family History  Problem Relation Age of Onset  . Alzheimer's disease Mother   . Breast cancer Mother   . Throat cancer Father      Prior to Admission medications   Medication Sig Start Date End Date Taking? Authorizing Provider  acetaminophen (TYLENOL) 500 MG tablet Take 1,000 mg by mouth every 6 (six) hours as needed (pain or headaches).     [provider]  albuterol (PROAIR HFA) 108 (90 Base) MCG/ACT inhaler Inhale 2 puffs into the lungs every  6 (six) hours as needed for wheezing or shortness of breath.    [provider]  baclofen (LIORESAL) 10 MG tablet Take 0.5 tablets (5 mg total) by mouth at bedtime as needed for muscle spasms. Patient taking differently: Take 10 mg by mouth at bedtime.  10/25/16   Debbe Odea, MD  carvedilol (COREG) 3.125 MG tablet Take 1 tablet (3.125 mg total) by mouth 2 (two) times daily with a meal. 02/26/16   Amin, Jeanella Flattery, MD  clopidogrel (PLAVIX) 75 MG tablet Take 1 tablet (75 mg total) by mouth daily. Hold plavix for a week, then restart 04/30/18   Florencia Reasons, MD  diphenhydramine-acetaminophen (TYLENOL PM) 25-500 MG TABS tablet Take 2 tablets by mouth at bedtime.    [provider]  ferrous sulfate 325 (65 FE) MG tablet Take 1 tablet (325 mg total) by mouth 2 (two) times daily with a meal. 02/07/18   Thurnell Lose, MD  fluticasone (FLONASE) 50 MCG/ACT nasal spray Place 1 spray into both nostrils every morning.  06/18/14   [provider]  guaiFENesin (MUCINEX) 600 MG 12 hr tablet Take 600-1,200 mg by mouth daily as needed for cough or to loosen phlegm.     [provider]  Melatonin 5 MG TABS Take 5 mg by mouth at bedtime.    [provider]  metFORMIN (GLUCOPHAGE) 1000 MG tablet Take 1,000 mg by mouth daily with breakfast.    [provider]  mometasone-formoterol (DULERA) 100-5 MCG/ACT AERO Inhale 2 puffs into the lungs 2 (two) times daily. Patient taking differently: Inhale 1 puff into the lungs 2 (two) times daily.  12/12/15   Tanda Rockers, MD  montelukast (SINGULAIR) 10 MG tablet Take 1 tablet (10 mg total) by mouth at bedtime. 10/05/17   Tanda Rockers, MD  polyethylene glycol (MIRALAX / GLYCOLAX) packet Take 17 g by mouth daily.    [provider]  psyllium (METAMUCIL) 58.6 % powder Take 1 packet by mouth every morning.     [provider]  rOPINIRole (REQUIP) 0.5 MG tablet Take 2 mg by mouth at bedtime.     [provider]  simvastatin (ZOCOR) 40 MG tablet Take 1 tablet (40 mg total) by mouth daily. Patient taking differently: Take 40 mg by mouth at bedtime.  02/26/16   Amin, Jeanella Flattery, MD  zolpidem (AMBIEN) 10 MG tablet Take 5-10 mg by mouth at bedtime as needed for sleep.     [provider]    Physical Exam: Vitals:   04/23/18 2130 04/23/18 2200 04/23/18 2215 04/23/18 2245  BP: (!) 156/95 (!) 130/50 (!) 142/83 123/69  Pulse:      Resp: (!) 26 18 (!) 24   Temp:      TempSrc:  SpO2:      Weight:      Height:        Constitutional: NAD, calm, comfortable Eyes: PERRL, lids and conjunctivae normal ENMT: Mucous membranes are moist. Posterior pharynx clear of any exudate or lesions.Normal dentition.  Neck: normal, supple, no masses, no thyromegaly Respiratory: clear to auscultation bilaterally, no wheezing, no crackles. Normal respiratory effort. No accessory muscle use.  Cardiovascular: Regular rate and rhythm, no murmurs / rubs / gallops. No extremity edema. 2+ pedal pulses. No carotid bruits.  Abdomen: no tenderness, no masses palpated. No hepatosplenomegaly. Bowel sounds positive.  Musculoskeletal: no clubbing / cyanosis. No joint deformity upper and lower extremities. Good ROM, no contractures. Normal muscle tone.  Skin: no rashes, lesions, ulcers. No induration Neurologic: CN 2-12 grossly intact. Sensation intact, DTR normal. Strength 5/5 in all 4.  Psychiatric: Normal judgment and insight. Alert and oriented x 3. Normal mood.    Labs on Admission: I have personally reviewed following labs and imaging studies  CBC: Recent Labs  Lab 04/21/18 2150 04/22/18 0505 04/22/18 1557 04/23/18 0441 04/23/18 1834  WBC 9.7  --  9.8 9.2 11.7*  NEUTROABS 5.7  --   --   --  6.4  HGB 10.1* 9.0* 8.9* 8.9* 9.3*  HCT 35.7*  --  29.9* 28.8* 31.8*  MCV 86.2  --  81.7 81.8 84.4  PLT 423*  --  406* 402* 923*   Basic Metabolic Panel: Recent Labs  Lab 04/21/18 2150  04/23/18 0441 04/23/18 1834 04/23/18 1853  NA 137 134* 130*  --   K 3.9 3.5 3.8  --   CL 111 105 100  --   CO2 22 23 22   --   GLUCOSE 135* 126* 98  --   BUN 14 13 15   --   CREATININE 0.67 0.68 0.75 0.70  CALCIUM 7.7* 8.3* 8.3*  --   MG  --  2.0  --   --    GFR: Estimated Creatinine Clearance: 69.7 mL/min (by C-G formula based on SCr of 0.7 mg/dL). Liver Function Tests: Recent Labs  Lab 04/21/18 2150 04/23/18 1834  AST 16 18  ALT 12 12  ALKPHOS 62 60  BILITOT 0.1* 0.3  PROT 5.9* 6.0*  ALBUMIN 3.3* 3.5   No results for input(s): LIPASE, AMYLASE in the last 168 hours. No results for input(s): AMMONIA in the last 168 hours. Coagulation Profile: Recent Labs  Lab 04/21/18 2150 04/23/18 1834  INR 1.0 1.0   Cardiac Enzymes: No results for input(s): CKTOTAL, CKMB, CKMBINDEX, TROPONINI in the last 168 hours. BNP (last 3 results) No results for input(s): PROBNP in the last 8760 hours. HbA1C: No results for input(s): HGBA1C in the last 72 hours. CBG: Recent Labs  Lab 04/22/18 1615 04/23/18 0721 04/23/18 1147  GLUCAP 121* 115* 109*   Lipid Profile: No results for input(s): CHOL, HDL, LDLCALC, TRIG, CHOLHDL, LDLDIRECT in the last 72 hours. Thyroid Function Tests: No results for input(s): TSH, T4TOTAL, FREET4, T3FREE, THYROIDAB in the last 72 hours. Anemia Panel: No results for input(s): VITAMINB12, FOLATE, FERRITIN, TIBC, IRON, RETICCTPCT in the last 72 hours. Urine analysis:    Component Value Date/Time   COLORURINE STRAW (A) 04/21/2018 2320   APPEARANCEUR CLEAR 04/21/2018 2320   LABSPEC 1.013 04/21/2018 2320   PHURINE 6.0 04/21/2018 2320   GLUCOSEU NEGATIVE 04/21/2018 2320   HGBUR NEGATIVE 04/21/2018 2320   BILIRUBINUR NEGATIVE 04/21/2018 2320   [place] 04/21/2018 2320   PROTEINUR NEGATIVE 04/21/2018 2320  UROBILINOGEN 0.2 12/22/2013 1210   NITRITE NEGATIVE 04/21/2018 2320   LEUKOCYTESUR NEGATIVE 04/21/2018 2320    Radiological Exams on  Admission: Ct Angio Head W Or Wo Contrast  Result Date: 04/23/2018 CLINICAL DATA:  Slurred speech. Assess for cerebral aneurysm. History of stroke, hypertension. EXAM: CT ANGIOGRAPHY HEAD AND NECK TECHNIQUE: Multidetector CT imaging of the head and neck was performed using the standard protocol during bolus administration of intravenous contrast. Multiplanar CT image reconstructions and MIPs were obtained to evaluate the vascular anatomy. Carotid stenosis measurements (when applicable) are obtained utilizing NASCET criteria, using the distal internal carotid diameter as the denominator. CONTRAST:  140mL OMNIPAQUE IOHEXOL 350 MG/ML SOLN COMPARISON:  CT HEAD April 23, 2018 FINDINGS: CTA NECK FINDINGS: AORTIC ARCH: Normal appearance of the thoracic arch, normal branch pattern. Mild calcific atherosclerosis. The origins of the innominate, left Common carotid artery and subclavian artery are patent; motion degrades evaluation. RIGHT CAROTID SYSTEM: Common carotid artery is patent. Mild calcific atherosclerosis of the carotid bifurcation without hemodynamically significant stenosis by NASCET criteria. Normal appearance of the internal carotid artery. LEFT CAROTID SYSTEM: Common carotid artery is patent. Mild calcific atherosclerosis of the carotid bifurcation without hemodynamically significant stenosis by NASCET criteria. Normal appearance of the internal carotid artery. VERTEBRAL ARTERIES:Left vertebral artery is dominant. Patent vertebral arteries with mild extrinsic compression due to degenerative cervical spine. SKELETON: No acute osseous process though bone windows have not been submitted. Grade 1 C4-5 anterolisthesis. OTHER NECK: Soft tissues of the neck are nonacute though, not tailored for evaluation. 14 mm non-specific LEFT hilar lymph node. UPPER CHEST: Included lung apices are clear. No superior mediastinal lymphadenopathy. CTA HEAD FINDINGS: ANTERIOR CIRCULATION: Patent cervical internal carotid arteries,  petrous, cavernous and supra clinoid internal carotid arteries; dolichoectasia seen with chronic hypertension. Patent anterior communicating artery. Patent anterior and middle cerebral arteries, mild luminal irregularity compatible with atherosclerosis. Mild stenosis LEFT M2 segment. No large vessel occlusion, flow-limiting stenosis, contrast extravasation or aneurysm. POSTERIOR CIRCULATION: Patent vertebral arteries, vertebrobasilar junction and basilar artery, as well as main branch vessels. Patent posterior cerebral arteries, mild luminal irregularity compatible with atherosclerosis. No large vessel occlusion, flow-limiting stenosis, contrast extravasation or aneurysm. VENOUS SINUSES: Major dural venous sinuses are patent though not tailored for evaluation on this angiographic examination. ANATOMIC VARIANTS: None. DELAYED PHASE: No abnormal intracranial enhancement. MIP images reviewed. IMPRESSION: CTA NECK: 1. No acute vascular process. No hemodynamically significant stenosis ICA's. 2. Patent vertebral arteries. CTA HEAD: 1. No emergent large vessel occlusion or flow-limiting stenosis. 2. Mild intracranial atherosclerosis. Electronically Signed   By: Elon Alas M.D.   On: 04/23/2018 23:14   Ct Head Wo Contrast  Result Date: 04/23/2018 CLINICAL DATA:  Slurred speech EXAM: CT HEAD WITHOUT CONTRAST TECHNIQUE: Contiguous axial images were obtained from the base of the skull through the vertex without intravenous contrast. COMPARISON:  12/23/2017 FINDINGS: Brain: No evidence of acute infarction, hemorrhage, hydrocephalus, extra-axial collection or mass lesion/mass effect. Periventricular white matter hypodensity and global volume loss. Vascular: No hyperdense vessel or unexpected calcification. Skull: Normal. Negative for fracture or focal lesion. Sinuses/Orbits: No acute finding. Mucosal thickening in the bilateral maxillary sinuses status post antrostomy. Other: None. IMPRESSION: No acute intracranial  pathology. Small-vessel white matter disease and global volume loss. Electronically Signed   By: Eddie Candle M.D.   On: 04/23/2018 19:47   Ct Angio Neck W And/or Wo Contrast  Result Date: 04/23/2018 CLINICAL DATA:  Slurred speech. Assess for cerebral aneurysm. History of stroke, hypertension. EXAM: CT ANGIOGRAPHY HEAD  AND NECK TECHNIQUE: Multidetector CT imaging of the head and neck was performed using the standard protocol during bolus administration of intravenous contrast. Multiplanar CT image reconstructions and MIPs were obtained to evaluate the vascular anatomy. Carotid stenosis measurements (when applicable) are obtained utilizing NASCET criteria, using the distal internal carotid diameter as the denominator. CONTRAST:  193mL OMNIPAQUE IOHEXOL 350 MG/ML SOLN COMPARISON:  CT HEAD April 23, 2018 FINDINGS: CTA NECK FINDINGS: AORTIC ARCH: Normal appearance of the thoracic arch, normal branch pattern. Mild calcific atherosclerosis. The origins of the innominate, left Common carotid artery and subclavian artery are patent; motion degrades evaluation. RIGHT CAROTID SYSTEM: Common carotid artery is patent. Mild calcific atherosclerosis of the carotid bifurcation without hemodynamically significant stenosis by NASCET criteria. Normal appearance of the internal carotid artery. LEFT CAROTID SYSTEM: Common carotid artery is patent. Mild calcific atherosclerosis of the carotid bifurcation without hemodynamically significant stenosis by NASCET criteria. Normal appearance of the internal carotid artery. VERTEBRAL ARTERIES:Left vertebral artery is dominant. Patent vertebral arteries with mild extrinsic compression due to degenerative cervical spine. SKELETON: No acute osseous process though bone windows have not been submitted. Grade 1 C4-5 anterolisthesis. OTHER NECK: Soft tissues of the neck are nonacute though, not tailored for evaluation. 14 mm non-specific LEFT hilar lymph node. UPPER CHEST: Included lung apices are  clear. No superior mediastinal lymphadenopathy. CTA HEAD FINDINGS: ANTERIOR CIRCULATION: Patent cervical internal carotid arteries, petrous, cavernous and supra clinoid internal carotid arteries; dolichoectasia seen with chronic hypertension. Patent anterior communicating artery. Patent anterior and middle cerebral arteries, mild luminal irregularity compatible with atherosclerosis. Mild stenosis LEFT M2 segment. No large vessel occlusion, flow-limiting stenosis, contrast extravasation or aneurysm. POSTERIOR CIRCULATION: Patent vertebral arteries, vertebrobasilar junction and basilar artery, as well as main branch vessels. Patent posterior cerebral arteries, mild luminal irregularity compatible with atherosclerosis. No large vessel occlusion, flow-limiting stenosis, contrast extravasation or aneurysm. VENOUS SINUSES: Major dural venous sinuses are patent though not tailored for evaluation on this angiographic examination. ANATOMIC VARIANTS: None. DELAYED PHASE: No abnormal intracranial enhancement. MIP images reviewed. IMPRESSION: CTA NECK: 1. No acute vascular process. No hemodynamically significant stenosis ICA's. 2. Patent vertebral arteries. CTA HEAD: 1. No emergent large vessel occlusion or flow-limiting stenosis. 2. Mild intracranial atherosclerosis. Electronically Signed   By: Elon Alas M.D.   On: 04/23/2018 23:14   Mr Brain Wo Contrast  Result Date: 04/23/2018 CLINICAL DATA:  20 minutes episode of transient aphasia, now resolved. History of stroke. EXAM: MRI HEAD WITHOUT CONTRAST TECHNIQUE: Multiplanar, multiecho pulse sequences of the brain and surrounding structures were obtained without intravenous contrast. COMPARISON:  CT HEAD April 23, 2018 and MRI of the head May 04, 2016. FINDINGS: INTRACRANIAL CONTENTS: No reduced diffusion to suggest acute ischemia. Scattered chronic microhemorrhages seen with chronic hypertension. Moderate to severe ventriculomegaly. Patchy to confluent supratentorial  white matter FLAIR T2 hyperintensities with cystic component. Old RIGHT basal ganglia infarct. Prominent basal ganglia perivascular spaces associated chronic small vessel ischemic changes. No midline shift, mass effect or masses. No abnormal extra-axial fluid collections. VASCULAR: Major intracranial vascular flow voids present at skull base, dolichoectasia seen with chronic hypertension. SKULL AND UPPER CERVICAL SPINE: No abnormal sellar expansion. No suspicious calvarial bone marrow signal. Craniocervical junction maintained. SINUSES/ORBITS: Pan paranasal sinusitis, status post FESS. Sphenoid sinus air-fluid levels. LEFT and to lesser extent RIGHT mastoid effusion.The included ocular globes and orbital contents are non-suspicious. Status post bilateral ocular lens implants. OTHER: None. IMPRESSION: 1. No acute intracranial process. 2. Old RIGHT basal ganglia infarct and moderate chronic  small vessel ischemic changes. 3. Moderate to severe parenchymal brain volume loss. 4. Acute sinusitis. 5. Moderate to severe parenchymal brain volume loss. Electronically Signed   By: Elon Alas M.D.   On: 04/23/2018 23:43   Nm Gi Blood Loss  Result Date: 04/22/2018 CLINICAL DATA:  GI bleed or blood in stool, history of stroke, hypertension, asthma, CHF EXAM: NUCLEAR MEDICINE GASTROINTESTINAL BLEEDING SCAN TECHNIQUE: Sequential abdominal images were obtained following intravenous administration of Tc-84m labeled red blood cells. RADIOPHARMACEUTICALS:  26 mCi Tc-22m pertechnetate in-vitro labeled red cells. COMPARISON:  CT abdomen and pelvis 02/04/2018 FINDINGS: Patient motion artifacts throughout exam. Normal blood pool distribution of tracer. Excretion of de labeled tracer into urinary bladder. A poorly defined area/blush of tracer is identified in the LEFT mid abdomen throughout the exam, showing no peristaltic change in configuration to suggest passage of blood throughout the GI tract. Based on prior CT exam this  would roughly correspond to small-bowel loops or the distal descending colon and could potentially represent an area of relative inflammation/hyperemia in the LEFT mid abdomen. No definite site of active gastrointestinal hemorrhage is identified. IMPRESSION: Poorly defined area/blush of tracer in the LEFT mid abdomen throughout the study, configuration stable throughout exam, not demonstrating peristaltic redistribution during the 2 hours of imaging. This favors an area of hyperemia in the LEFT mid abdomen either at small bowel or the distal descending colon. Consider CTA imaging of the abdomen for further assessment. Electronically Signed   By: Lavonia Dana M.D.   On: 04/22/2018 14:47    EKG: Independently reviewed.  Assessment/Plan Principal Problem:   TIA (transient ischemic attack) Active Problems:   Diabetes mellitus type II, non insulin dependent (HCC)   Benign essential HTN   GI bleed   Chronic combined systolic and diastolic CHF (congestive heart failure) ([place])    1. TIA - 1. Neuro consulted 2. TIA pathway 3. MRI 4. CTA head and neck = No emergent large vessel occlusion, patent vertebral and no hemodynamically significant ICA stenosis. 5. PT/OT/SLP 6. Tele monitor 7. Neuro to discuss timing of anti-platelet resuming with GI.  Originally had planned to be off of plavix until 3/14. 2. Recurrent Diverticular bleed - 1. Resolved at the moment 2. Biggest concern is: when / will this reoccur again with anti-platelets? 3. HTN - continue home BP meds 4. CHF - continue home meds 5. DM2 - 1. Hold metformin 2. Mod scale SSI AC  DVT prophylaxis: SCDs Code Status: DNR Family Communication: Wife at bedside Disposition Plan: Home after admit Consults called: Neuro Admission status: Place in 3    Keymoni Mccaster, [place] [facility]  How to contact the [facility] Attending or Consulting provider [place] or covering provider during after hours [employer], for this patient?  1. Check the  care team in [facility] and look for a) attending/consulting [facility] provider listed and b) the [facility] team listed 2. Log into [email or web address]  Amion Physician Scheduling and messaging for groups and whole hospitals  On call and physician scheduling software for group practices, residents, hospitalists and other medical providers for call, clinic, rotation and shift schedules. On[employer] is a hospital-wide system for scheduling doctors and paging doctors on call. EasyPlot is for scientific plotting and data analysis.  [email or web address]  and use [place]'s universal password to access. If you do not have the password, please contact the hospital operator.  3. Locate the [facility] provider you are looking for under [facility] and page to a number that  you can be directly reached. 4. If you still have difficulty reaching the provider, please page the [facility] (Director on Call) for the Hospitalists listed on amion for assistance.  04/24/2018, 12:20 AM

## 2018-04-24 NOTE — ED Notes (Signed)
Attending MD will discharge pt was ED as pt has been holding in ED for hours now

## 2018-04-24 NOTE — Discharge Summary (Signed)
Physician Discharge Summary  [name] XBM:[id number] DOB: 1932/02/11 DOA: 04/23/2018  PCP: Seward Carol, MD  Admit date: 04/23/2018 Discharge date: 04/24/2018  Time spent: 40 minutes  Recommendations for Outpatient Follow-up:  1. Follow-up with primary care physician in 1 week    Discharge Diagnoses:  Principal Problem:   TIA (transient ischemic attack) Active Problems:   Diabetes mellitus type II, non insulin dependent (HCC)   Benign essential HTN   GI bleed   Chronic combined systolic and diastolic CHF (congestive heart failure) ([place])   Discharge Condition: Stable  Diet recommendation: Cardiac diabetic  Filed Weights   04/23/18 1824  Weight: 71.7 kg    History of present illness:  [name] is a 85 y.o. male with medical history significant of L MCA stroke, recurrent diverticular bleeds.  Patient has been on Plavix for stroke prevention.  But was just taken off of plavix on 3/5 after he presented to the ED (and was admitted to our service) for hematochezia.  Ultimately again felt to be due to diverticular bleeding.  He was discharged just earlier today on 3/7.  After getting home, he had episode of aphasia that lasted about 10-20 mins.  He was talking with someone else and began having difficulty getting his words out. He states that he could understand but could not get his words out. His wife states that the sounds coming out of his mouth were not words, but he seemed to be trying to say something. This lasted for approximately 10 to20 minutes and then subsequently resolved. She did not notice any weakness, facial droop or other problems. He states that his eyesight might of been slightly fuzzy, but otherwise no other symptoms. He is currently back to his baseline.  Hospital Course:  Work-up in the emergency department with CT head without contrast did not show any acute intracranial abnormality.  Patient also underwent a CT angiogram of the head and neck  without any stenosis.  MRI of the brain only showed the old basal ganglion stroke, no other acute abnormality.  Patient has chronic microvascular disease with volume loss.  Patient was also seen by neurology, recommended low-dose aspirin.  Patient is back to his baseline speech, functional activity usually walks with the help of the walker.  Patient is found to have mild hyponatremia with a sodium of 130.  Patient states has been having mild cough mild productive sputum.  During patient's history of COPD, patient will be discharged home with doxycycline, prednisone.  Offered physical therapy, Occupational Therapy, nurse follow-up considering patient's recent admission, current TIA, patient and his wife refused.  They stated that they get a lot of help at the assisted living facility.  And is tolerating diet well.  Vital signs are well within normal limits.  Patient is felt stable to be discharged home.  Patient is recommended to follow-up with primary care physician in 1 week with the follow-up of BMP to ensure sodium continues to improve.  Procedures: MRI of the brain CT head without contrast CTA of head and neck Consultations:  Neurology  Discharge Exam: Vitals:   04/24/18 0900 04/24/18 1000  BP: 111/67 (!) 108/91  Pulse:    Resp: 19 (!) 24  Temp:    SpO2:      Constitutional: NAD, calm, comfortable Eyes: PERRL, lids and conjunctivae normal ENMT: Mucous membranes are moist. Posterior pharynx clear of any exudate or lesions.Normal dentition.  Neck: normal, supple, no masses, no thyromegaly Respiratory: clear to auscultation bilaterally, no  wheezing, no crackles. Normal respiratory effort. No accessory muscle use.  Cardiovascular: Regular rate and rhythm, no murmurs / rubs / gallops. No extremity edema. 2+ pedal pulses. No carotid bruits.  Abdomen: no tenderness, no masses palpated. No hepatosplenomegaly. Bowel sounds positive.  Musculoskeletal: no clubbing / cyanosis. No joint deformity  upper and lower extremities. Good ROM, no contractures. Normal muscle tone.  Skin: no rashes, lesions, ulcers. No induration Neurologic: CN 2-12 grossly intact. Sensation intact, DTR normal. Strength 5/5 in all 4.  Psychiatric: Normal judgment and insight. Alert and oriented x 3. Normal mood.  Discharge Instructions   Discharge Instructions    Diet - low sodium heart healthy   Complete by:  As directed    Increase activity slowly   Complete by:  As directed      Allergies as of 04/24/2018      Reactions   Tape Other (See Comments)   SKIN IS SENSITIVE; PLEASE USE PAPER TAPE; SKIN BRUISES AND TEARS EASILY!!   Azithromycin Rash, Other (See Comments)   Pt had a ZPak Jan 2020 - no reaction      Medication List    STOP taking these medications   acetaminophen 500 MG tablet Commonly known as:  TYLENOL   baclofen 10 MG tablet Commonly known as:  LIORESAL   clopidogrel 75 MG tablet Commonly known as:  PLAVIX   diphenhydramine-acetaminophen 25-500 MG Tabs tablet Commonly known as:  TYLENOL PM   montelukast 10 MG tablet Commonly known as:  SINGULAIR   zolpidem 10 MG tablet Commonly known as:  AMBIEN     TAKE these medications   aspirin 81 MG EC tablet Take 1 tablet (81 mg total) by mouth daily. Start taking on:  April 25, 2018   carvedilol 3.125 MG tablet Commonly known as:  COREG Take 1 tablet (3.125 mg total) by mouth 2 (two) times daily with a meal.   doxycycline 50 MG capsule Commonly known as:  VIBRAMYCIN Take 2 capsules (100 mg total) by mouth 2 (two) times daily. Take with food and plenty of water. Sit upright 30 min after taking the pill   ferrous sulfate 325 (65 FE) MG tablet Take 1 tablet (325 mg total) by mouth 2 (two) times daily with a meal.   fluticasone 50 MCG/ACT nasal spray Commonly known as:  FLONASE Place 1 spray into both nostrils every morning.   guaiFENesin 600 MG 12 hr tablet Commonly known as:  MUCINEX Take 600-1,200 mg by mouth daily as  needed for cough or to loosen phlegm.   Melatonin 5 MG Tabs Take 5 mg by mouth at bedtime.   metFORMIN 1000 MG tablet Commonly known as:  GLUCOPHAGE Take 1,000 mg by mouth daily with breakfast.   mometasone-formoterol 100-5 MCG/ACT Aero Commonly known as:  DULERA Inhale 2 puffs into the lungs 2 (two) times daily. What changed:  how much to take   polyethylene glycol packet Commonly known as:  MIRALAX / GLYCOLAX Take 17 g by mouth daily.   predniSONE 20 MG tablet Commonly known as:  DELTASONE Take 1 tablet (20 mg total) by mouth 2 (two) times daily with a meal.   ProAir HFA 108 (90 Base) MCG/ACT inhaler Generic drug:  albuterol Inhale 2 puffs into the lungs every 6 (six) hours as needed for wheezing or shortness of breath.   psyllium 58.6 % powder Commonly known as:  METAMUCIL Take 1 packet by mouth every morning.   rOPINIRole 0.5 MG tablet Commonly known as:  REQUIP  Take 2 mg by mouth at bedtime.   simvastatin 40 MG tablet Commonly known as:  ZOCOR Take 1 tablet (40 mg total) by mouth daily. What changed:  when to take this      Allergies  Allergen Reactions  . Tape Other (See Comments)    SKIN IS SENSITIVE; PLEASE USE PAPER TAPE; SKIN BRUISES AND TEARS EASILY!!  . Azithromycin Rash and Other (See Comments)    Pt had a ZPak Jan 2020 - no reaction   Follow-up Information    Seward Carol, MD.   Specialty:  Internal Medicine Contact information: [address] [place]  [address] [phone]            The results of significant diagnostics from this hospitalization (including imaging, microbiology, ancillary and laboratory) are listed below for reference.    Significant Diagnostic Studies: Ct Angio Head W Or Wo Contrast  Result Date: 04/23/2018 CLINICAL DATA:  Slurred speech. Assess for cerebral aneurysm. History of stroke, hypertension. EXAM: CT ANGIOGRAPHY HEAD AND NECK TECHNIQUE: Multidetector CT imaging of the head and neck was  performed using the standard protocol during bolus administration of intravenous contrast. Multiplanar CT image reconstructions and MIPs were obtained to evaluate the vascular anatomy. Carotid stenosis measurements (when applicable) are obtained utilizing NASCET criteria, using the distal internal carotid diameter as the denominator. CONTRAST:  178mL OMNIPAQUE IOHEXOL 350 MG/ML SOLN COMPARISON:  CT HEAD April 23, 2018 FINDINGS: CTA NECK FINDINGS: AORTIC ARCH: Normal appearance of the thoracic arch, normal branch pattern. Mild calcific atherosclerosis. The origins of the innominate, left Common carotid artery and subclavian artery are patent; motion degrades evaluation. RIGHT CAROTID SYSTEM: Common carotid artery is patent. Mild calcific atherosclerosis of the carotid bifurcation without hemodynamically significant stenosis by NASCET criteria. Normal appearance of the internal carotid artery. LEFT CAROTID SYSTEM: Common carotid artery is patent. Mild calcific atherosclerosis of the carotid bifurcation without hemodynamically significant stenosis by NASCET criteria. Normal appearance of the internal carotid artery. VERTEBRAL ARTERIES:Left vertebral artery is dominant. Patent vertebral arteries with mild extrinsic compression due to degenerative cervical spine. SKELETON: No acute osseous process though bone windows have not been submitted. Grade 1 C4-5 anterolisthesis. OTHER NECK: Soft tissues of the neck are nonacute though, not tailored for evaluation. 14 mm non-specific LEFT hilar lymph node. UPPER CHEST: Included lung apices are clear. No superior mediastinal lymphadenopathy. CTA HEAD FINDINGS: ANTERIOR CIRCULATION: Patent cervical internal carotid arteries, petrous, cavernous and supra clinoid internal carotid arteries; dolichoectasia seen with chronic hypertension. Patent anterior communicating artery. Patent anterior and middle cerebral arteries, mild luminal irregularity compatible with atherosclerosis. Mild  stenosis LEFT M2 segment. No large vessel occlusion, flow-limiting stenosis, contrast extravasation or aneurysm. POSTERIOR CIRCULATION: Patent vertebral arteries, vertebrobasilar junction and basilar artery, as well as main branch vessels. Patent posterior cerebral arteries, mild luminal irregularity compatible with atherosclerosis. No large vessel occlusion, flow-limiting stenosis, contrast extravasation or aneurysm. VENOUS SINUSES: Major dural venous sinuses are patent though not tailored for evaluation on this angiographic examination. ANATOMIC VARIANTS: None. DELAYED PHASE: No abnormal intracranial enhancement. MIP images reviewed. IMPRESSION: CTA NECK: 1. No acute vascular process. No hemodynamically significant stenosis ICA's. 2. Patent vertebral arteries. CTA HEAD: 1. No emergent large vessel occlusion or flow-limiting stenosis. 2. Mild intracranial atherosclerosis. Electronically Signed   By: Elon Alas M.D.   On: 04/23/2018 23:14   Dg Chest 2 View  Result Date: 04/24/2018 CLINICAL DATA:  Stroke. EXAM: CHEST - 2 VIEW COMPARISON:  Radiograph 05/04/2016 FINDINGS: Mild cardiomegaly. Aortic atherosclerosis.  Prominent left epicardial fat pad. Pulmonary vasculature is normal. No consolidation, pleural effusion, or pneumothorax. No acute osseous abnormalities are seen. IMPRESSION: Mild cardiomegaly and aortic atherosclerosis. No acute chest findings. Electronically Signed   By: Keith Rake M.D.   On: 04/24/2018 01:15   Ct Head Wo Contrast  Result Date: 04/23/2018 CLINICAL DATA:  Slurred speech EXAM: CT HEAD WITHOUT CONTRAST TECHNIQUE: Contiguous axial images were obtained from the base of the skull through the vertex without intravenous contrast. COMPARISON:  12/23/2017 FINDINGS: Brain: No evidence of acute infarction, hemorrhage, hydrocephalus, extra-axial collection or mass lesion/mass effect. Periventricular white matter hypodensity and global volume loss. Vascular: No hyperdense vessel or  unexpected calcification. Skull: Normal. Negative for fracture or focal lesion. Sinuses/Orbits: No acute finding. Mucosal thickening in the bilateral maxillary sinuses status post antrostomy. Other: None. IMPRESSION: No acute intracranial pathology. Small-vessel white matter disease and global volume loss. Electronically Signed   By: Eddie Candle M.D.   On: 04/23/2018 19:47   Ct Angio Neck W And/or Wo Contrast  Result Date: 04/23/2018 CLINICAL DATA:  Slurred speech. Assess for cerebral aneurysm. History of stroke, hypertension. EXAM: CT ANGIOGRAPHY HEAD AND NECK TECHNIQUE: Multidetector CT imaging of the head and neck was performed using the standard protocol during bolus administration of intravenous contrast. Multiplanar CT image reconstructions and MIPs were obtained to evaluate the vascular anatomy. Carotid stenosis measurements (when applicable) are obtained utilizing NASCET criteria, using the distal internal carotid diameter as the denominator. CONTRAST:  140mL OMNIPAQUE IOHEXOL 350 MG/ML SOLN COMPARISON:  CT HEAD April 23, 2018 FINDINGS: CTA NECK FINDINGS: AORTIC ARCH: Normal appearance of the thoracic arch, normal branch pattern. Mild calcific atherosclerosis. The origins of the innominate, left Common carotid artery and subclavian artery are patent; motion degrades evaluation. RIGHT CAROTID SYSTEM: Common carotid artery is patent. Mild calcific atherosclerosis of the carotid bifurcation without hemodynamically significant stenosis by NASCET criteria. Normal appearance of the internal carotid artery. LEFT CAROTID SYSTEM: Common carotid artery is patent. Mild calcific atherosclerosis of the carotid bifurcation without hemodynamically significant stenosis by NASCET criteria. Normal appearance of the internal carotid artery. VERTEBRAL ARTERIES:Left vertebral artery is dominant. Patent vertebral arteries with mild extrinsic compression due to degenerative cervical spine. SKELETON: No acute osseous process  though bone windows have not been submitted. Grade 1 C4-5 anterolisthesis. OTHER NECK: Soft tissues of the neck are nonacute though, not tailored for evaluation. 14 mm non-specific LEFT hilar lymph node. UPPER CHEST: Included lung apices are clear. No superior mediastinal lymphadenopathy. CTA HEAD FINDINGS: ANTERIOR CIRCULATION: Patent cervical internal carotid arteries, petrous, cavernous and supra clinoid internal carotid arteries; dolichoectasia seen with chronic hypertension. Patent anterior communicating artery. Patent anterior and middle cerebral arteries, mild luminal irregularity compatible with atherosclerosis. Mild stenosis LEFT M2 segment. No large vessel occlusion, flow-limiting stenosis, contrast extravasation or aneurysm. POSTERIOR CIRCULATION: Patent vertebral arteries, vertebrobasilar junction and basilar artery, as well as main branch vessels. Patent posterior cerebral arteries, mild luminal irregularity compatible with atherosclerosis. No large vessel occlusion, flow-limiting stenosis, contrast extravasation or aneurysm. VENOUS SINUSES: Major dural venous sinuses are patent though not tailored for evaluation on this angiographic examination. ANATOMIC VARIANTS: None. DELAYED PHASE: No abnormal intracranial enhancement. MIP images reviewed. IMPRESSION: CTA NECK: 1. No acute vascular process. No hemodynamically significant stenosis ICA's. 2. Patent vertebral arteries. CTA HEAD: 1. No emergent large vessel occlusion or flow-limiting stenosis. 2. Mild intracranial atherosclerosis. Electronically Signed   By: Elon Alas M.D.   On: 04/23/2018 23:14   Mr Brain Wo Contrast  Result Date: 04/23/2018 CLINICAL DATA:  20 minutes episode of transient aphasia, now resolved. History of stroke. EXAM: MRI HEAD WITHOUT CONTRAST TECHNIQUE: Multiplanar, multiecho pulse sequences of the brain and surrounding structures were obtained without intravenous contrast. COMPARISON:  CT HEAD April 23, 2018 and MRI of the  head May 04, 2016. FINDINGS: INTRACRANIAL CONTENTS: No reduced diffusion to suggest acute ischemia. Scattered chronic microhemorrhages seen with chronic hypertension. Moderate to severe ventriculomegaly. Patchy to confluent supratentorial white matter FLAIR T2 hyperintensities with cystic component. Old RIGHT basal ganglia infarct. Prominent basal ganglia perivascular spaces associated chronic small vessel ischemic changes. No midline shift, mass effect or masses. No abnormal extra-axial fluid collections. VASCULAR: Major intracranial vascular flow voids present at skull base, dolichoectasia seen with chronic hypertension. SKULL AND UPPER CERVICAL SPINE: No abnormal sellar expansion. No suspicious calvarial bone marrow signal. Craniocervical junction maintained. SINUSES/ORBITS: Pan paranasal sinusitis, status post FESS. Sphenoid sinus air-fluid levels. LEFT and to lesser extent RIGHT mastoid effusion.The included ocular globes and orbital contents are non-suspicious. Status post bilateral ocular lens implants. OTHER: None. IMPRESSION: 1. No acute intracranial process. 2. Old RIGHT basal ganglia infarct and moderate chronic small vessel ischemic changes. 3. Moderate to severe parenchymal brain volume loss. 4. Acute sinusitis. 5. Moderate to severe parenchymal brain volume loss. Electronically Signed   By: Elon Alas M.D.   On: 04/23/2018 23:43   Nm Gi Blood Loss  Result Date: 04/22/2018 CLINICAL DATA:  GI bleed or blood in stool, history of stroke, hypertension, asthma, CHF EXAM: NUCLEAR MEDICINE GASTROINTESTINAL BLEEDING SCAN TECHNIQUE: Sequential abdominal images were obtained following intravenous administration of Tc-65m labeled red blood cells. RADIOPHARMACEUTICALS:  26 mCi Tc-29m pertechnetate in-vitro labeled red cells. COMPARISON:  CT abdomen and pelvis 02/04/2018 FINDINGS: Patient motion artifacts throughout exam. Normal blood pool distribution of tracer. Excretion of de labeled tracer into  urinary bladder. A poorly defined area/blush of tracer is identified in the LEFT mid abdomen throughout the exam, showing no peristaltic change in configuration to suggest passage of blood throughout the GI tract. Based on prior CT exam this would roughly correspond to small-bowel loops or the distal descending colon and could potentially represent an area of relative inflammation/hyperemia in the LEFT mid abdomen. No definite site of active gastrointestinal hemorrhage is identified. IMPRESSION: Poorly defined area/blush of tracer in the LEFT mid abdomen throughout the study, configuration stable throughout exam, not demonstrating peristaltic redistribution during the 2 hours of imaging. This favors an area of hyperemia in the LEFT mid abdomen either at small bowel or the distal descending colon. Consider CTA imaging of the abdomen for further assessment. Electronically Signed   By: Lavonia Dana M.D.   On: 04/22/2018 14:47    Microbiology: Recent Results (from the past 240 hour(s))  Urine culture     Status: Abnormal   Collection Time: 04/21/18 11:28 PM  Result Value Ref Range Status   Specimen Description URINE, RANDOM  Final   Special Requests NONE  Final   Culture (A)  Final    <10,000 COLONIES/mL INSIGNIFICANT GROWTH Performed at [facility], [address], [place], [place] [address]    Report Status 04/23/2018 FINAL  Final     Labs: Basic Metabolic Panel: Recent Labs  Lab 04/21/18 2150 04/23/18 0441 04/23/18 1834 04/23/18 1853  NA 137 134* 130*  --   K 3.9 3.5 3.8  --   CL 111 105 100  --   CO2 22 23 22   --   GLUCOSE 135* 126*  98  --   BUN 14 13 15   --   CREATININE 0.67 0.68 0.75 0.70  CALCIUM 7.7* 8.3* 8.3*  --   MG  --  2.0  --   --    Liver Function Tests: Recent Labs  Lab 04/21/18 2150 04/23/18 1834  AST 16 18  ALT 12 12  ALKPHOS 62 60  BILITOT 0.1* 0.3  PROT 5.9* 6.0*  ALBUMIN 3.3* 3.5   No results for input(s): LIPASE, AMYLASE in the last 168  hours. No results for input(s): AMMONIA in the last 168 hours. CBC: Recent Labs  Lab 04/21/18 2150 04/22/18 0505 04/22/18 1557 04/23/18 0441 04/23/18 1834  WBC 9.7  --  9.8 9.2 11.7*  NEUTROABS 5.7  --   --   --  6.4  HGB 10.1* 9.0* 8.9* 8.9* 9.3*  HCT 35.7*  --  29.9* 28.8* 31.8*  MCV 86.2  --  81.7 81.8 84.4  PLT 423*  --  406* 402* 424*   Cardiac Enzymes: No results for input(s): CKTOTAL, CKMB, CKMBINDEX, TROPONINI in the last 168 hours. BNP: BNP (last 3 results) No results for input(s): BNP in the last 8760 hours.  ProBNP (last 3 results) No results for input(s): PROBNP in the last 8760 hours.  CBG: Recent Labs  Lab 04/22/18 1615 04/23/18 0721 04/23/18 1147 04/24/18 1017  GLUCAP 121* 115* 109* 173*       Signed:  Omri Bertran MD.  [facility] 04/24/2018, 12:21 PM

## 2018-04-24 NOTE — ED Notes (Signed)
Breakfast Tray ordered  

## 2018-04-24 NOTE — ED Notes (Signed)
Patient verbalizes understanding of discharge instructions. Opportunity for questioning and answers were provided. Armband removed by staff, pt discharged from ED.  

## 2018-04-28 DIAGNOSIS — K5791 Diverticulosis of intestine, part unspecified, without perforation or abscess with bleeding: Secondary | ICD-10-CM | POA: Diagnosis not present

## 2018-04-28 DIAGNOSIS — G459 Transient cerebral ischemic attack, unspecified: Secondary | ICD-10-CM | POA: Diagnosis not present

## 2018-04-28 DIAGNOSIS — J40 Bronchitis, not specified as acute or chronic: Secondary | ICD-10-CM | POA: Diagnosis not present

## 2018-04-29 DIAGNOSIS — R69 Illness, unspecified: Secondary | ICD-10-CM | POA: Diagnosis not present

## 2018-05-30 DIAGNOSIS — K5731 Diverticulosis of large intestine without perforation or abscess with bleeding: Secondary | ICD-10-CM | POA: Diagnosis not present

## 2018-05-30 DIAGNOSIS — J45909 Unspecified asthma, uncomplicated: Secondary | ICD-10-CM | POA: Diagnosis not present

## 2018-05-30 DIAGNOSIS — Z0000 Encounter for general adult medical examination without abnormal findings: Secondary | ICD-10-CM | POA: Diagnosis not present

## 2018-05-30 DIAGNOSIS — R69 Illness, unspecified: Secondary | ICD-10-CM | POA: Diagnosis not present

## 2018-05-30 DIAGNOSIS — G459 Transient cerebral ischemic attack, unspecified: Secondary | ICD-10-CM | POA: Diagnosis not present

## 2018-05-30 DIAGNOSIS — I519 Heart disease, unspecified: Secondary | ICD-10-CM | POA: Diagnosis not present

## 2018-05-30 DIAGNOSIS — E1169 Type 2 diabetes mellitus with other specified complication: Secondary | ICD-10-CM | POA: Diagnosis not present

## 2018-05-30 DIAGNOSIS — Z1389 Encounter for screening for other disorder: Secondary | ICD-10-CM | POA: Diagnosis not present

## 2018-05-30 DIAGNOSIS — E7800 Pure hypercholesterolemia, unspecified: Secondary | ICD-10-CM | POA: Diagnosis not present

## 2018-05-30 DIAGNOSIS — G2581 Restless legs syndrome: Secondary | ICD-10-CM | POA: Diagnosis not present

## 2018-06-03 DIAGNOSIS — J40 Bronchitis, not specified as acute or chronic: Secondary | ICD-10-CM | POA: Diagnosis not present

## 2018-06-03 DIAGNOSIS — J9801 Acute bronchospasm: Secondary | ICD-10-CM | POA: Diagnosis not present

## 2018-06-09 DIAGNOSIS — R69 Illness, unspecified: Secondary | ICD-10-CM | POA: Diagnosis not present

## 2018-06-22 ENCOUNTER — Ambulatory Visit: Payer: Medicare HMO | Admitting: Neurology

## 2018-06-25 DIAGNOSIS — R69 Illness, unspecified: Secondary | ICD-10-CM | POA: Diagnosis not present

## 2018-06-28 ENCOUNTER — Telehealth: Payer: Self-pay | Admitting: Gastroenterology

## 2018-06-28 DIAGNOSIS — Z8719 Personal history of other diseases of the digestive system: Secondary | ICD-10-CM

## 2018-06-28 NOTE — Telephone Encounter (Signed)
The patient has been notified his lab order is in [place] and all questions answered.

## 2018-06-28 NOTE — Telephone Encounter (Signed)
Patient called said he needs to have labs done.. but I don't see where any labs have been ordered

## 2018-07-01 DIAGNOSIS — J9801 Acute bronchospasm: Secondary | ICD-10-CM | POA: Diagnosis not present

## 2018-07-01 DIAGNOSIS — J45991 Cough variant asthma: Secondary | ICD-10-CM | POA: Diagnosis not present

## 2018-07-05 DIAGNOSIS — R69 Illness, unspecified: Secondary | ICD-10-CM | POA: Diagnosis not present

## 2018-07-12 ENCOUNTER — Other Ambulatory Visit (INDEPENDENT_AMBULATORY_CARE_PROVIDER_SITE_OTHER): Payer: Medicare HMO

## 2018-07-12 DIAGNOSIS — Z8719 Personal history of other diseases of the digestive system: Secondary | ICD-10-CM | POA: Diagnosis not present

## 2018-07-12 LAB — CBC WITH DIFFERENTIAL/PLATELET
Basophils Absolute: 0.1 10*3/uL (ref 0.0–0.1)
Basophils Relative: 0.6 % (ref 0.0–3.0)
Eosinophils Absolute: 0 10*3/uL (ref 0.0–0.7)
Eosinophils Relative: 0.1 % (ref 0.0–5.0)
HCT: 42.7 % (ref 39.0–52.0)
Hemoglobin: 13.9 g/dL (ref 13.0–17.0)
Lymphocytes Relative: 14.9 % (ref 12.0–46.0)
Lymphs Abs: 1.6 10*3/uL (ref 0.7–4.0)
MCHC: 32.5 g/dL (ref 30.0–36.0)
MCV: 83.3 fl (ref 78.0–100.0)
Monocytes Absolute: 0.4 10*3/uL (ref 0.1–1.0)
Monocytes Relative: 3.8 % (ref 3.0–12.0)
Neutro Abs: 8.5 10*3/uL — ABNORMAL HIGH (ref 1.4–7.7)
Neutrophils Relative %: 80.6 % — ABNORMAL HIGH (ref 43.0–77.0)
Platelets: 489 10*3/uL — ABNORMAL HIGH (ref 150.0–400.0)
RBC: 5.13 Mil/uL (ref 4.22–5.81)
RDW: 21.1 % — ABNORMAL HIGH (ref 11.5–15.5)
WBC: 10.6 10*3/uL — ABNORMAL HIGH (ref 4.0–10.5)

## 2018-08-02 DIAGNOSIS — R69 Illness, unspecified: Secondary | ICD-10-CM | POA: Diagnosis not present

## 2018-08-05 DIAGNOSIS — R05 Cough: Secondary | ICD-10-CM | POA: Diagnosis not present

## 2018-08-10 DIAGNOSIS — E119 Type 2 diabetes mellitus without complications: Secondary | ICD-10-CM | POA: Diagnosis not present

## 2018-08-10 DIAGNOSIS — D649 Anemia, unspecified: Secondary | ICD-10-CM | POA: Diagnosis not present

## 2018-08-10 DIAGNOSIS — I639 Cerebral infarction, unspecified: Secondary | ICD-10-CM | POA: Diagnosis not present

## 2018-08-21 ENCOUNTER — Emergency Department (HOSPITAL_COMMUNITY): Payer: Medicare HMO

## 2018-08-21 ENCOUNTER — Other Ambulatory Visit: Payer: Self-pay

## 2018-08-21 ENCOUNTER — Observation Stay (HOSPITAL_COMMUNITY)
Admission: EM | Admit: 2018-08-21 | Discharge: 2018-08-22 | Disposition: A | Discharge status: Home / Self Care | Payer: Medicare HMO | Attending: Internal Medicine | Admitting: Internal Medicine

## 2018-08-21 ENCOUNTER — Encounter (HOSPITAL_COMMUNITY): Payer: Self-pay | Admitting: *Deleted

## 2018-08-21 DIAGNOSIS — I700 Atherosclerosis of aorta: Secondary | ICD-10-CM | POA: Diagnosis not present

## 2018-08-21 DIAGNOSIS — K921 Melena: Secondary | ICD-10-CM

## 2018-08-21 DIAGNOSIS — I110 Hypertensive heart disease with heart failure: Secondary | ICD-10-CM | POA: Insufficient documentation

## 2018-08-21 DIAGNOSIS — Z8673 Personal history of transient ischemic attack (TIA), and cerebral infarction without residual deficits: Secondary | ICD-10-CM

## 2018-08-21 DIAGNOSIS — Z1159 Encounter for screening for other viral diseases: Secondary | ICD-10-CM | POA: Insufficient documentation

## 2018-08-21 DIAGNOSIS — Z9081 Acquired absence of spleen: Secondary | ICD-10-CM | POA: Diagnosis not present

## 2018-08-21 DIAGNOSIS — K5733 Diverticulitis of large intestine without perforation or abscess with bleeding: Secondary | ICD-10-CM | POA: Diagnosis not present

## 2018-08-21 DIAGNOSIS — Z7952 Long term (current) use of systemic steroids: Secondary | ICD-10-CM | POA: Insufficient documentation

## 2018-08-21 DIAGNOSIS — R569 Unspecified convulsions: Secondary | ICD-10-CM | POA: Diagnosis not present

## 2018-08-21 DIAGNOSIS — R55 Syncope and collapse: Secondary | ICD-10-CM | POA: Diagnosis not present

## 2018-08-21 DIAGNOSIS — J45909 Unspecified asthma, uncomplicated: Secondary | ICD-10-CM | POA: Insufficient documentation

## 2018-08-21 DIAGNOSIS — I959 Hypotension, unspecified: Secondary | ICD-10-CM | POA: Diagnosis not present

## 2018-08-21 DIAGNOSIS — K219 Gastro-esophageal reflux disease without esophagitis: Secondary | ICD-10-CM | POA: Insufficient documentation

## 2018-08-21 DIAGNOSIS — Z7984 Long term (current) use of oral hypoglycemic drugs: Secondary | ICD-10-CM | POA: Diagnosis not present

## 2018-08-21 DIAGNOSIS — K5731 Diverticulosis of large intestine without perforation or abscess with bleeding: Secondary | ICD-10-CM | POA: Diagnosis present

## 2018-08-21 DIAGNOSIS — Z7982 Long term (current) use of aspirin: Secondary | ICD-10-CM | POA: Insufficient documentation

## 2018-08-21 DIAGNOSIS — Z79899 Other long term (current) drug therapy: Secondary | ICD-10-CM | POA: Diagnosis not present

## 2018-08-21 DIAGNOSIS — K222 Esophageal obstruction: Secondary | ICD-10-CM

## 2018-08-21 DIAGNOSIS — I10 Essential (primary) hypertension: Secondary | ICD-10-CM | POA: Diagnosis present

## 2018-08-21 DIAGNOSIS — J9811 Atelectasis: Secondary | ICD-10-CM | POA: Insufficient documentation

## 2018-08-21 DIAGNOSIS — Z8719 Personal history of other diseases of the digestive system: Secondary | ICD-10-CM | POA: Insufficient documentation

## 2018-08-21 DIAGNOSIS — I5042 Chronic combined systolic (congestive) and diastolic (congestive) heart failure: Secondary | ICD-10-CM | POA: Diagnosis not present

## 2018-08-21 DIAGNOSIS — E785 Hyperlipidemia, unspecified: Secondary | ICD-10-CM | POA: Insufficient documentation

## 2018-08-21 DIAGNOSIS — Z20828 Contact with and (suspected) exposure to other viral communicable diseases: Secondary | ICD-10-CM | POA: Diagnosis not present

## 2018-08-21 DIAGNOSIS — E119 Type 2 diabetes mellitus without complications: Secondary | ICD-10-CM | POA: Diagnosis not present

## 2018-08-21 DIAGNOSIS — K625 Hemorrhage of anus and rectum: Secondary | ICD-10-CM | POA: Diagnosis present

## 2018-08-21 DIAGNOSIS — K449 Diaphragmatic hernia without obstruction or gangrene: Secondary | ICD-10-CM | POA: Diagnosis not present

## 2018-08-21 DIAGNOSIS — R4020 Unspecified coma: Secondary | ICD-10-CM | POA: Diagnosis not present

## 2018-08-21 DIAGNOSIS — R58 Hemorrhage, not elsewhere classified: Secondary | ICD-10-CM | POA: Diagnosis not present

## 2018-08-21 DIAGNOSIS — K922 Gastrointestinal hemorrhage, unspecified: Secondary | ICD-10-CM | POA: Diagnosis not present

## 2018-08-21 DIAGNOSIS — K5730 Diverticulosis of large intestine without perforation or abscess without bleeding: Secondary | ICD-10-CM | POA: Diagnosis not present

## 2018-08-21 DIAGNOSIS — Z66 Do not resuscitate: Secondary | ICD-10-CM | POA: Diagnosis not present

## 2018-08-21 DIAGNOSIS — Z7951 Long term (current) use of inhaled steroids: Secondary | ICD-10-CM | POA: Diagnosis not present

## 2018-08-21 DIAGNOSIS — I480 Paroxysmal atrial fibrillation: Secondary | ICD-10-CM | POA: Insufficient documentation

## 2018-08-21 DIAGNOSIS — M1611 Unilateral primary osteoarthritis, right hip: Secondary | ICD-10-CM | POA: Insufficient documentation

## 2018-08-21 DIAGNOSIS — I77811 Abdominal aortic ectasia: Secondary | ICD-10-CM | POA: Diagnosis not present

## 2018-08-21 LAB — COMPREHENSIVE METABOLIC PANEL
ALT: 11 U/L (ref 0–44)
AST: 17 U/L (ref 15–41)
Albumin: 3.4 g/dL — ABNORMAL LOW (ref 3.5–5.0)
Alkaline Phosphatase: 62 U/L (ref 38–126)
Anion gap: 8 (ref 5–15)
BUN: 13 mg/dL (ref 8–23)
CO2: 23 mmol/L (ref 22–32)
Calcium: 8.5 mg/dL — ABNORMAL LOW (ref 8.9–10.3)
Chloride: 106 mmol/L (ref 98–111)
Creatinine, Ser: 0.79 mg/dL (ref 0.61–1.24)
GFR calc Af Amer: >60 mL/min (ref >60)
GFR calc non Af Amer: >60 mL/min (ref >60)
Glucose, Bld: 135 mg/dL — ABNORMAL HIGH (ref 70–99)
Potassium: 4.5 mmol/L (ref 3.5–5.1)
Sodium: 137 mmol/L (ref 135–145)
Total Bilirubin: 0.8 mg/dL (ref 0.3–1.2)
Total Protein: 6 g/dL — ABNORMAL LOW (ref 6.5–8.1)

## 2018-08-21 LAB — CBC WITH DIFFERENTIAL/PLATELET
Abs Immature Granulocytes: 0.14 10*3/uL — ABNORMAL HIGH (ref 0.00–0.07)
Basophils Absolute: 0.1 10*3/uL (ref 0.0–0.1)
Basophils Relative: 1 %
Eosinophils Absolute: 1.2 10*3/uL — ABNORMAL HIGH (ref 0.0–0.5)
Eosinophils Relative: 6 %
HCT: 44.6 % (ref 39.0–52.0)
Hemoglobin: 13.4 g/dL (ref 13.0–17.0)
Immature Granulocytes: 1 %
Lymphocytes Relative: 12 %
Lymphs Abs: 2.5 10*3/uL (ref 0.7–4.0)
MCH: 27.7 pg (ref 26.0–34.0)
MCHC: 30 g/dL (ref 30.0–36.0)
MCV: 92.3 fL (ref 80.0–100.0)
Monocytes Absolute: 1.5 10*3/uL — ABNORMAL HIGH (ref 0.1–1.0)
Monocytes Relative: 7 %
Neutro Abs: 15.1 10*3/uL — ABNORMAL HIGH (ref 1.7–7.7)
Neutrophils Relative %: 73 %
Platelets: 388 10*3/uL (ref 150–400)
RBC: 4.83 MIL/uL (ref 4.22–5.81)
RDW: 22 % — ABNORMAL HIGH (ref 11.5–15.5)
WBC: 20.6 10*3/uL — ABNORMAL HIGH (ref 4.0–10.5)
nRBC: 0 % (ref 0.0–0.2)

## 2018-08-21 LAB — I-STAT CHEM 8, ED
BUN: 13 mg/dL (ref 8–23)
Calcium, Ion: 1.11 mmol/L — ABNORMAL LOW (ref 1.15–1.40)
Chloride: 104 mmol/L (ref 98–111)
Creatinine, Ser: 0.7 mg/dL (ref 0.61–1.24)
Glucose, Bld: 130 mg/dL — ABNORMAL HIGH (ref 70–99)
HCT: 44 % (ref 39.0–52.0)
Hemoglobin: 15 g/dL (ref 13.0–17.0)
Potassium: 4.4 mmol/L (ref 3.5–5.1)
Sodium: 137 mmol/L (ref 135–145)
TCO2: 25 mmol/L (ref 22–32)

## 2018-08-21 LAB — SARS CORONAVIRUS 2 BY RT PCR (HOSPITAL ORDER, PERFORMED IN ~~LOC~~ HOSPITAL LAB): SARS Coronavirus 2: NEGATIVE

## 2018-08-21 LAB — CBG MONITORING, ED: Glucose-Capillary: 141 mg/dL — ABNORMAL HIGH (ref 70–99)

## 2018-08-21 LAB — TYPE AND SCREEN
ABO/RH(D): O POS
Antibody Screen: NEGATIVE

## 2018-08-21 LAB — PROTIME-INR
INR: 1 (ref 0.8–1.2)
Prothrombin Time: 12.9 seconds (ref 11.4–15.2)

## 2018-08-21 LAB — GLUCOSE, CAPILLARY: Glucose-Capillary: 183 mg/dL — ABNORMAL HIGH (ref 70–99)

## 2018-08-21 MED ORDER — INSULIN ASPART 100 UNIT/ML ~~LOC~~ SOLN: 0.0000 [IU] | Freq: Every day | SUBCUTANEOUS | Status: DC

## 2018-08-21 MED ORDER — METRONIDAZOLE IN NACL 5-0.79 MG/ML-% IV SOLN
500.0000 mg | Freq: Three times a day (TID) | INTRAVENOUS | Status: DC
  Administered 2018-08-21 – 2018-08-22 (×2): 500 mg via INTRAVENOUS
  Filled 2018-08-21 (×2): qty 100

## 2018-08-21 MED ORDER — IOHEXOL 350 MG/ML SOLN
100.0000 mL | Freq: Once | INTRAVENOUS | Status: AC | PRN
  Administered 2018-08-21: 18:00:00 100 mL via INTRAVENOUS

## 2018-08-21 MED ORDER — PANTOPRAZOLE SODIUM 40 MG IV SOLR: 40.0000 mg | Freq: Two times a day (BID) | INTRAVENOUS | Status: DC

## 2018-08-21 MED ORDER — SODIUM CHLORIDE 0.9 % IV SOLN
8.0000 mg/h | INTRAVENOUS | Status: DC
  Administered 2018-08-22: 8 mg/h via INTRAVENOUS
  Filled 2018-08-21: qty 80

## 2018-08-21 MED ORDER — LACTATED RINGERS IV SOLN
INTRAVENOUS | Status: DC
  Administered 2018-08-21: 23:00:00 via INTRAVENOUS

## 2018-08-21 MED ORDER — ONDANSETRON HCL 4 MG PO TABS: 4.0000 mg | ORAL_TABLET | Freq: Four times a day (QID) | ORAL | Status: DC | PRN

## 2018-08-21 MED ORDER — CIPROFLOXACIN IN D5W 400 MG/200ML IV SOLN
400.0000 mg | Freq: Two times a day (BID) | INTRAVENOUS | Status: DC
  Administered 2018-08-21 – 2018-08-22 (×2): 400 mg via INTRAVENOUS
  Filled 2018-08-21 (×2): qty 200

## 2018-08-21 MED ORDER — SODIUM CHLORIDE 0.9 % IV BOLUS
500.0000 mL | Freq: Once | INTRAVENOUS | Status: AC
  Administered 2018-08-21: 500 mL via INTRAVENOUS

## 2018-08-21 MED ORDER — INSULIN ASPART 100 UNIT/ML ~~LOC~~ SOLN: 0.0000 [IU] | Freq: Three times a day (TID) | SUBCUTANEOUS | Status: DC

## 2018-08-21 MED ORDER — ONDANSETRON HCL 4 MG/2ML IJ SOLN: 4.0000 mg | Freq: Four times a day (QID) | INTRAMUSCULAR | Status: DC | PRN

## 2018-08-21 MED ORDER — SODIUM CHLORIDE 0.9 % IV SOLN
80.0000 mg | Freq: Once | INTRAVENOUS | Status: AC
  Administered 2018-08-22: 80 mg via INTRAVENOUS
  Filled 2018-08-21: qty 80

## 2018-08-21 NOTE — H&P (Signed)
History and Physical   [name] PPJ:[id number] DOB: [birth year] DOA: 08/21/2018  Referring MD/NP/PA: Dr. Ralene Bathe  PCP: Grant Carol, MD   Outpatient Specialists: Dr. Silvano Rusk  Patient coming from: Home  Chief Complaint: Rectal bleed  HPI: [name] is a 85 y.o. male with medical history significant of diverticulosis with recurrent bleed, history of CVAs, asthma, hypertension, previous colitis, diabetes, GERD, osteoarthritis who is on Plavix and aspirin for his previous CVA.  Presenting to the ER with multiple episodes of rectal bleed at home.  Patient described them as bright red blood per rectum.  He has had at least 5 episodes.  He was noted to have 1 more episode in the ER.  Patient has had previous colonoscopies noting the diverticular disease.  He denied eating any nuts or seeds lately.  Patient has had stable vitals and stable hemoglobin so far.  GI has been consulted in the ER and CT angiogram of the abdomen has been done with the bleeding scan protocol no evidence of acute bleeding vessel.  There is mild evidence of diverticulitis.  Patient has otherwise had no significant pain.  No nausea or vomiting.  His COVID-19 testing has been negative.  He has some bloating otherwise no other significant complaint at this point.  Is being admitted with a rectal bleed for GI evaluation and treatment..  ED Course: Temperature is 9078 blood pressure 139/79 pulse 109 respiratory of 26 oxygen sat 97% on room air.  White count is 20.6 otherwise CBC and CMP appear to be all within normal.  Hemoglobin is 15.  Chest x-ray showed no active disease.  CT angiogram of abdomen pelvis showed descending colonic diverticulosis with suspicion of mild diverticulitis.  No other GI bleed.  Evidence of hiatal hernia.  Patient is being admitted therefore with GI bleed for evaluation.  Review of Systems: As per HPI otherwise 10 point review of systems negative.    Past Medical History:  Diagnosis Date  . Acute  CVA (cerebrovascular accident) ([place]) 07/25/2015  . Asthma   . Benign essential HTN   . Cerebral infarction due to embolism of left middle cerebral artery ([place]) 02/03/2014  . Chronic combined systolic and diastolic CHF (congestive heart failure) ([place]) 12/23/2017  . Colitis   . Colon polyps   . Diabetes mellitus type 2, diet-controlled ([place]) 12/22/2013  . Diverticulosis   . GERD (gastroesophageal reflux disease)   . GI bleed   . Hemorrhoids   . Hiatal hernia   . History of esophageal strciture   . HLD (hyperlipidemia)   . Osteoarthritis   . Proctitis   . Status post dilation of esophageal narrowing   . Stroke [facility])     Past Surgical History:  Procedure Laterality Date  . ABDOMINAL HERNIA REPAIR    . COLONOSCOPY     multiple  . COLONOSCOPY WITH PROPOFOL N/A 12/12/2016   Procedure: COLONOSCOPY WITH PROPOFOL;  Surgeon: Gatha Mayer, MD;  Location: [facility] ENDOSCOPY;  Service: Endoscopy;  Laterality: N/A;  . ESOPHAGOGASTRODUODENOSCOPY     multiple  . INSERTION OF MESH  01/29/2012   Procedure: INSERTION OF MESH;  Surgeon: Gwenyth Ober, MD;  Location: [place];  Service: General;  Laterality: N/A;  . NISSEN FUNDOPLICATION    . SPLENECTOMY, TOTAL     nontraumatic rupture  . VENTRAL HERNIA REPAIR  01/29/2012    WITH MESH  . VENTRAL HERNIA REPAIR  01/29/2012   Procedure: HERNIA REPAIR VENTRAL ADULT;  Surgeon: Gwenyth Ober, MD;  Location: [facility] OR;  Service: General;  Laterality: N/A;  open recurrent ventral hernia repair with mesh     reports that he has never smoked. He has never used smokeless tobacco. He reports current alcohol use of about 1.0 standard drinks of alcohol per week. He reports that he does not use drugs.  Allergies  Allergen Reactions  . Tape Other (See Comments)    SKIN IS SENSITIVE; PLEASE USE PAPER TAPE; SKIN BRUISES AND TEARS EASILY!!  . Azithromycin Rash and Other (See Comments)    Pt had a ZPak Jan 2020 - no reaction    Family History  Problem Relation Age of  Onset  . Alzheimer's disease Mother   . Breast cancer Mother   . Throat cancer Father      Prior to Admission medications   Medication Sig Start Date End Date Taking? Authorizing Provider  albuterol (PROAIR HFA) 108 (90 Base) MCG/ACT inhaler Inhale 2 puffs into the lungs every 6 (six) hours as needed for wheezing or shortness of breath.    [provider]  aspirin EC 81 MG EC tablet Take 1 tablet (81 mg total) by mouth daily. 04/25/18   Monica Becton, MD  carvedilol (COREG) 3.125 MG tablet Take 1 tablet (3.125 mg total) by mouth 2 (two) times daily with a meal. 02/26/16   Amin, Jeanella Flattery, MD  doxycycline (VIBRAMYCIN) 50 MG capsule Take 2 capsules (100 mg total) by mouth 2 (two) times daily. Take with food and plenty of water. Sit upright 30 min after taking the pill 04/24/18   Monica Becton, MD  ferrous sulfate 325 (65 FE) MG tablet Take 1 tablet (325 mg total) by mouth 2 (two) times daily with a meal. 02/07/18   Thurnell Lose, MD  fluticasone (FLONASE) 50 MCG/ACT nasal spray Place 1 spray into both nostrils every morning.  06/18/14   [provider]  guaiFENesin (MUCINEX) 600 MG 12 hr tablet Take 600-1,200 mg by mouth daily as needed for cough or to loosen phlegm.     [provider]  Melatonin 5 MG TABS Take 5 mg by mouth at bedtime.    [provider]  metFORMIN (GLUCOPHAGE) 1000 MG tablet Take 1,000 mg by mouth daily with breakfast.    [provider]  mometasone-formoterol (DULERA) 100-5 MCG/ACT AERO Inhale 2 puffs into the lungs 2 (two) times daily. Patient taking differently: Inhale 1 puff into the lungs 2 (two) times daily.  12/12/15   Tanda Rockers, MD  polyethylene glycol (MIRALAX / GLYCOLAX) packet Take 17 g by mouth daily.    [provider]  predniSONE (DELTASONE) 20 MG tablet Take 1 tablet (20 mg total) by mouth 2 (two) times daily with a meal. 04/24/18   Vasireddy, Grier Mitts, MD  psyllium (METAMUCIL) 58.6 % powder Take  1 packet by mouth every morning.     [provider]  rOPINIRole (REQUIP) 0.5 MG tablet Take 2 mg by mouth at bedtime.     [provider]  simvastatin (ZOCOR) 40 MG tablet Take 1 tablet (40 mg total) by mouth daily. Patient taking differently: Take 40 mg by mouth at bedtime.  02/26/16   Damita Lack, MD    Physical Exam: Vitals:   08/21/18 1600 08/21/18 1630 08/21/18 1645 08/21/18 1700  BP: 130/85 125/86  130/89  Pulse: (!) 109  97 99  Resp: 19  (!) 25 (!) 26  Temp:      TempSrc:      SpO2: 97%  97% 98%  Weight:      Height:          Constitutional: NAD, calm, comfortable Vitals:   08/21/18 1600 08/21/18 1630 08/21/18 1645 08/21/18 1700  BP: 130/85 125/86  130/89  Pulse: (!) 109  97 99  Resp: 19  (!) 25 (!) 26  Temp:      TempSrc:      SpO2: 97%  97% 98%  Weight:      Height:       Eyes: PERRL, lids and conjunctivae normal ENMT: Mucous membranes are moist. Posterior pharynx clear of any exudate or lesions.Normal dentition.  Neck: normal, supple, no masses, no thyromegaly Respiratory: clear to auscultation bilaterally, no wheezing, no crackles. Normal respiratory effort. No accessory muscle use.  Cardiovascular: Sinus tachycardia no murmurs / rubs / gallops. No extremity edema. 2+ pedal pulses. No carotid bruits.  Abdomen: Left lower quadrant abdominal tenderness, no masses palpated. No hepatosplenomegaly. Bowel sounds positive.  Musculoskeletal: no clubbing / cyanosis. No joint deformity upper and lower extremities. Good ROM, no contractures. Normal muscle tone.  Skin: no rashes, lesions, ulcers. No induration Neurologic: CN 2-12 grossly intact. Sensation intact, DTR normal. Strength 5/5 in all 4.  Psychiatric: Normal judgment and insight. Alert and oriented x 3. Normal mood.     Labs on Admission: I have personally reviewed following labs and imaging studies  CBC: Recent Labs  Lab 08/21/18 1550 08/21/18 1644  WBC 20.6*  --   NEUTROABS  15.1*  --   HGB 13.4 15.0  HCT 44.6 44.0  MCV 92.3  --   PLT 388  --    Basic Metabolic Panel: Recent Labs  Lab 08/21/18 1550 08/21/18 1644  NA 137 137  K 4.5 4.4  CL 106 104  CO2 23  --   GLUCOSE 135* 130*  BUN 13 13  CREATININE 0.79 0.70  CALCIUM 8.5*  --    GFR: Estimated Creatinine Clearance: 69.7 mL/min (by C-G formula based on SCr of 0.7 mg/dL). Liver Function Tests: Recent Labs  Lab 08/21/18 1550  AST 17  ALT 11  ALKPHOS 62  BILITOT 0.8  PROT 6.0*  ALBUMIN 3.4*   No results for input(s): LIPASE, AMYLASE in the last 168 hours. No results for input(s): AMMONIA in the last 168 hours. Coagulation Profile: Recent Labs  Lab 08/21/18 1550  INR 1.0   Cardiac Enzymes: No results for input(s): CKTOTAL, CKMB, CKMBINDEX, TROPONINI in the last 168 hours. BNP (last 3 results) No results for input(s): PROBNP in the last 8760 hours. HbA1C: No results for input(s): HGBA1C in the last 72 hours. CBG: Recent Labs  Lab 08/21/18 1539  GLUCAP 141*   Lipid Profile: No results for input(s): CHOL, HDL, LDLCALC, TRIG, CHOLHDL, LDLDIRECT in the last 72 hours. Thyroid Function Tests: No results for input(s): TSH, T4TOTAL, FREET4, T3FREE, THYROIDAB in the last 72 hours. Anemia Panel: No results for input(s): VITAMINB12, FOLATE, FERRITIN, TIBC, IRON, RETICCTPCT in the last 72 hours. Urine analysis:    Component Value Date/Time   COLORURINE STRAW (A) 04/21/2018 2320   APPEARANCEUR CLEAR 04/21/2018 2320   LABSPEC 1.013 04/21/2018 2320   PHURINE 6.0 04/21/2018 2320   GLUCOSEU NEGATIVE 04/21/2018 2320   HGBUR NEGATIVE 04/21/2018 2320   BILIRUBINUR NEGATIVE 04/21/2018 2320   KETONESUR NEGATIVE 04/21/2018 2320   PROTEINUR NEGATIVE 04/21/2018 2320   UROBILINOGEN 0.2 12/22/2013 1210   NITRITE NEGATIVE 04/21/2018 2320   LEUKOCYTESUR NEGATIVE 04/21/2018 2320   Sepsis Labs: @LABRCNTIP (procalcitonin:4,lacticidven:4) ) Recent Results (  from the past 240 hour(s))  SARS  Coronavirus 2 (CEPHEID - Performed in [place] hospital lab), Hosp Order     Status: None   Collection Time: 08/21/18  3:51 PM   Specimen: Nasopharyngeal Swab  Result Value Ref Range Status   SARS Coronavirus 2 NEGATIVE NEGATIVE Final    Comment: (NOTE) If result is NEGATIVE SARS-CoV-2 target nucleic acids are NOT DETECTED. The SARS-CoV-2 RNA is generally detectable in upper and lower  respiratory specimens during the acute phase of infection. The lowest  concentration of SARS-CoV-2 viral copies this assay can detect is 250  copies / mL. A negative result does not preclude SARS-CoV-2 infection  and should not be used as the sole basis for treatment or other  patient management decisions.  A negative result may occur with  improper specimen collection / handling, submission of specimen other  than nasopharyngeal swab, presence of viral mutation(s) within the  areas targeted by this assay, and inadequate number of viral copies  (<250 copies / mL). A negative result must be combined with clinical  observations, patient history, and epidemiological information. If result is POSITIVE SARS-CoV-2 target nucleic acids are DETECTED. The SARS-CoV-2 RNA is generally detectable in upper and lower  respiratory specimens dur ing the acute phase of infection.  Positive  results are indicative of active infection with SARS-CoV-2.  Clinical  correlation with patient history and other diagnostic information is  necessary to determine patient infection status.  Positive results do  not rule out bacterial infection or co-infection with other viruses. If result is PRESUMPTIVE POSTIVE SARS-CoV-2 nucleic acids MAY BE PRESENT.   A presumptive positive result was obtained on the submitted specimen  and confirmed on repeat testing.  While 2019 novel coronavirus  (SARS-CoV-2) nucleic acids may be present in the submitted sample  additional confirmatory testing may be necessary for epidemiological  and / or  clinical management purposes  to differentiate between  SARS-CoV-2 and other Sarbecovirus currently known to infect humans.  If clinically indicated additional testing with an alternate test  methodology [phone]) is advised. The SARS-CoV-2 RNA is generally  detectable in upper and lower respiratory sp ecimens during the acute  phase of infection. The expected result is Negative. Fact Sheet for Patients:  StrictlyIdeas.no Fact Sheet for Healthcare Providers: BankingDealers.co.za This test is not yet approved or cleared by the [place] FDA and has been authorized for detection and/or diagnosis of SARS-CoV-2 by FDA under an Emergency Use Authorization (EUA).  This EUA will remain in effect (meaning this test can be used) for the duration of the COVID-19 declaration under Section 564(b)(1) of the Act, 21 U.S.C. section 360bbb-3(b)(1), unless the authorization is terminated or revoked sooner. Performed at [facility], [place] [address] [place], [place] [address]      Radiological Exams on Admission: Dg Chest 1 View  Result Date: 08/21/2018 CLINICAL DATA:  Syncope EXAM: CHEST  1 VIEW COMPARISON:  April 24, 2018 FINDINGS: The heart size and mediastinal contours are stable. There is no focal infiltrate, pulmonary edema, or pleural effusion. The visualized skeletal structures are unremarkable. IMPRESSION: No active cardiopulmonary disease. Electronically Signed   By: Abelardo Diesel M.D.   On: 08/21/2018 17:15   Ct Angio Abd/pel W/ And/or W/o  Result Date: 08/21/2018 CLINICAL DATA:  Rectal bleeding.  Concern for GI bleed. EXAM: CTA ABDOMEN AND PELVIS wITHOUT AND WITH CONTRAST TECHNIQUE: Multidetector CT imaging of the abdomen and pelvis was performed using the standard protocol during bolus  administration of intravenous contrast. Multiplanar reconstructed images and MIPs were obtained and reviewed to evaluate the vascular anatomy. CONTRAST:   141mL OMNIPAQUE IOHEXOL 350 MG/ML SOLN COMPARISON:  Nuclear medicine GI bleeding study of 04/22/2018. 02/04/2018 abdominopelvic CT. FINDINGS: VASCULAR Aorta: Atherosclerosis within.  No aneurysm. Celiac: Widely patent SMA: Atherosclerosis within. Renals: Atherosclerotic irregularity of the origin of the right renal. Left renal is widely patent. IMA: Patent. Inflow: Atherosclerosis without significant stenosis. Right common iliac artery ectasia at 1.6 cm on 131/13. Veins: Not well evaluated. Review of the MIP images confirms the above findings. NON-VASCULAR Lower chest: Right middle lobe scarring. Normal heart size without pericardial or pleural effusion. Small to moderate hiatal hernia with surgical changes at the proximal stomach. Hepatobiliary: Extensive artifact from EKG wires and leads, arm position. Normal liver. Normal gallbladder, without biliary ductal dilatation. Pancreas: Pancreatic atrophy is likely within normal variation for age. Spleen: Splenectomy. Adrenals/Urinary Tract: Normal adrenal glands. Hyperattenuation in the renal collecting systems prior to contrast is likely due to inadvertent contrast administration. Bilateral too small to characterize renal lesions. Interpolar left renal 1.6 cm cyst or minimally complex cyst. Normal urinary bladder. Stomach/Bowel: Normal distal stomach. Extensive colonic diverticulosis. Mild pericolonic edema is suspected, including on image 103/13. Normal terminal ileum. No sites of active extravasation identified. Normal small bowel. Lymphatic: No abdominopelvic adenopathy. Reproductive: Normal prostate. Other: No significant free fluid. No free intraperitoneal air. Extensive surgical changes about the anterior upper abdominal wall. Musculoskeletal: Right hip osteoarthritis.  Lumbosacral spondylosis. IMPRESSION: VASCULAR 1. Atherosclerosis, without acute finding. 2. Right common iliac artery ectasia. NON-VASCULAR 1. Descending colonic diverticulosis with suspicion of  mild diverticulitis. 2. No evidence of active extravasation to localize GI bleeding. 3. Decreased sensitivity and specificity exam due to technique related factors, as described above. 4. Hiatal hernia. Electronically Signed   By: Abigail Miyamoto M.D.   On: 08/21/2018 18:45      Assessment/Plan Principal Problem:   Rectal bleed Active Problems:   History of esophageal strciture   Diverticulosis of colon with hemorrhage   Diabetes mellitus type II, non insulin dependent (HCC)   Seizures (HCC)   HLD (hyperlipidemia)   History of CVA (cerebrovascular accident)   Benign essential HTN   PAF (paroxysmal atrial fibrillation) (HCC)   Syncope   Chronic combined systolic and diastolic CHF (congestive heart failure) ([place])     #1 rectal bleed: Patient has had recurrent GI bleed.  Most likely due to diverticular bleed with mild diverticulitis.  Patient will be admitted and placed on clear liquid diet.  IV Protonix.  Serial CBCs.  Appreciate GI input.  Empirically initial antibiotics for possible diverticulitis.  Hold aspirin and Plavix.  #2 GERD: Continue PPIs twice a day.  #3 hyperlipidemia: Resume home regimen whenever feasible.  #4 paroxysmal atrial fibrillation: Patient currently in sinus rhythm.  Continue monitor on telemetry.  #5 syncope: Secondary to GI bleed.  Patient however is hemodynamically stable here.  Again monitor on telemetry and hydrate.  No transfusion at this point.  #6 history of CHF: Hemodynamically stable.  Last echocardiogram was in 2018 show EF of 45%.  No evidence of fluid overload at the moment.  Gently hydrate therefore.  #7 diabetes: Sliding scale insulin will be initiated.  Hold metformin.   DVT prophylaxis: SCD Code Status: DNR Family Communication: Wife at bedside Disposition Plan: Home Consults called: Gastroenterology Dr. Carlean Purl Admission status: Inpatient  Severity of Illness: The appropriate patient status for this patient is INPATIENT. Inpatient  status is judged to be  reasonable and necessary in order to provide the required intensity of service to ensure the patient's safety. The patient's presenting symptoms, physical exam findings, and initial radiographic and laboratory data in the context of their chronic comorbidities is felt to place them at high risk for further clinical deterioration. Furthermore, it is not anticipated that the patient will be medically stable for discharge from the hospital within 2 midnights of admission. The following factors support the patient status of inpatient.   " The patient's presenting symptoms include Rectal bleed. " The worrisome physical exam findings include mild left lower quadrant tenderness. " The initial radiographic and laboratory data are worrisome because of evidence of diverticulosis with diverticulitis. " The chronic co-morbidities include history of diverticular disease.   * I certify that at the point of admission it is my clinical judgment that the patient will require inpatient hospital care spanning beyond 2 midnights from the point of admission due to high intensity of service, high risk for further deterioration and high frequency of surveillance required.Barbette Merino MD [facility] Pager [phone]  If 7PM-7AM, please contact night-coverage [email or web address] Password [facility]  08/21/2018, 7:32 PM

## 2018-08-21 NOTE — Consult Note (Signed)
[facility] Consult Note   History [name] MRN # [id number]  Date of Admission: 08/21/2018 Date of Consultation: 08/21/2018 Referring physician: Dr. Quintella Reichert, MD Primary Care Provider: Seward Carol, MD Primary Gastroenterologist: Dr. Oretha Caprice   Reason for Consultation/Chief Complaint: Hematochezia  Subjective  HPI:  This is an 85 year old man with multiple medical problems described below, on Plavix for prior CVA and TIA, who presents with another episode of brisk bright red blood per rectum.  This is happened to him several times in the past, most recently in March of this year.  On that occasion, it was presumed to be diverticular, and could not be picked up on a nuclear medicine bleeding scan.  He had had an episode in October 2018 and underwent colonoscopy by Dr. Silvano Rusk, discovered left-sided diverticulosis and no active bleeding.  He appears to have another episode in December 2017, as there is another bleeding scan report from then.  He had multiple episodes of bright red blood per rectum starting midmorning and into the mid afternoon, each preceded by cramps and urgency.  With the most recent episode, he felt weak and had what sounds like a vagal episode with a brief period of loss consciousness.  His wife called the ambulance to bring him in, and she has now arrived to corroborate history.  Right now Trevell is alert and conversational and in no acute distress.  He remains tachycardic and he is orthostatic.  He has some lower abdominal bloating and occasional cramps, no witnessed passage of blood per rectum since he arrived about hour ago.  He currently denies chest pain or dyspnea.  He reports a longstanding chronic dry cough, unchanged recently.  No sputum production no fever, no travel or known COVID contacts.  His rapid COVID test is about to be taken.  ROS:  Chronic arthritic pain. No residual neurologic deficit after prior CVA.  All other  systems are negative except as noted above in the HPI  Past Medical History Past Medical History:  Diagnosis Date  . Acute CVA (cerebrovascular accident) ([place]) 07/25/2015  . Asthma   . Benign essential HTN   . Cerebral infarction due to embolism of left middle cerebral artery ([place]) 02/03/2014  . Chronic combined systolic and diastolic CHF (congestive heart failure) ([place]) 12/23/2017  . Colitis   . Colon polyps   . Diabetes mellitus type 2, diet-controlled ([place]) 12/22/2013  . Diverticulosis   . GERD (gastroesophageal reflux disease)   . GI bleed   . Hemorrhoids   . Hiatal hernia   . History of esophageal strciture   . HLD (hyperlipidemia)   . Osteoarthritis   . Proctitis   . Status post dilation of esophageal narrowing   . Stroke [facility])    After the hospitalization in March of this year for diverticular bleeding, he was briefly hospitalized again the following day for an episode of brief aphasia and weakness that was felt to be a TIA.  The patient had been taken off his Plavix during the hospital stay for bleeding.  Past Surgical History Past Surgical History:  Procedure Laterality Date  . ABDOMINAL HERNIA REPAIR    . COLONOSCOPY     multiple  . COLONOSCOPY WITH PROPOFOL N/A 12/12/2016   Procedure: COLONOSCOPY WITH PROPOFOL;  Surgeon: Gatha Mayer, MD;  [facility] ENDOSCOPY;  Service: Endoscopy;  Laterality: N/A;  . ESOPHAGOGASTRODUODENOSCOPY     multiple  . INSERTION OF MESH  01/29/2012   Procedure: INSERTION OF MESH;  Surgeon: Gwenyth Ober, MD;  Location: [facility];  Service: General;  Laterality: N/A;  . NISSEN FUNDOPLICATION    . SPLENECTOMY, TOTAL     nontraumatic rupture  . VENTRAL HERNIA REPAIR  01/29/2012    WITH MESH  . VENTRAL HERNIA REPAIR  01/29/2012   Procedure: HERNIA REPAIR VENTRAL ADULT;  Surgeon: Gwenyth Ober, MD;  Location: [place];  Service: General;  Laterality: N/A;  open recurrent ventral hernia repair with mesh    Family History Family History   Problem Relation Age of Onset  . Alzheimer's disease Mother   . Breast cancer Mother   . Throat cancer Father     Social History Social History   Socioeconomic History  . Marital status: Married    Spouse name: [name]  . Number of children: 2  . Years of education: Bachelor's  . Highest education level: Not on file  Occupational History  . Occupation: retired  Scientific laboratory technician  . Financial resource strain: Not on file  . Food insecurity    Worry: Not on file    Inability: Not on file  . Transportation needs    Medical: Not on file    Non-medical: Not on file  Tobacco Use  . Smoking status: Never Smoker  . Smokeless tobacco: Never Used  Substance and Sexual Activity  . Alcohol use: Yes    Alcohol/week: 1.0 standard drinks    Types: 1 Glasses of wine per week    Comment: daily  . Drug use: No  . Sexual activity: Not on file  Lifestyle  . Physical activity    Days per week: Not on file    Minutes per session: Not on file  . Stress: Not on file  Relationships  . Social Herbalist on phone: Not on file    Gets together: Not on file    Attends religious service: Not on file    Active member of club or organization: Not on file    Attends meetings of clubs or organizations: Not on file    Relationship status: Not on file  Other Topics Concern  . Not on file  Social History Narrative   Patient is married and has 2 children.   Patient is right handed.   Patient has Bachelor's degree.   Patient drinks 2 cups daily.    Allergies Allergies  Allergen Reactions  . Tape Other (See Comments)    SKIN IS SENSITIVE; PLEASE USE PAPER TAPE; SKIN BRUISES AND TEARS EASILY!!  . Azithromycin Rash and Other (See Comments)    Pt had a ZPak Jan 2020 - no reaction    Outpatient Meds Home medications from the H+P and/or nursing med reconciliation reviewed.  He is on aspirin, Plavix, carvedilol, iron tablets, Requip and  Zocor _____________________________________________________________________ Objective   Exam:  Current vital signs  Patient Vitals for the past 8 hrs:  BP Temp Temp src Pulse Resp SpO2 Height Weight  08/21/18 1544 - - - - - - 5\' 10"  (1.778 m) 71.7 kg  08/21/18 1526 139/79 97.8 F (36.6 C) Oral 99 18 99 % - -   No intake or output data in the 24 hours ending 08/21/18 1626  Physical Exam: His wife was present for most of the visit Pulse laying down 110, rises to 120 standing   General: this is a pleasant and conversational and oriented elderly male patient in no acute distress.  Eyes: sclera anicteric, no redness  ENT: oral mucosa moist  without lesions, no cervical or supraclavicular lymphadenopathy, oropharynx not visualized, patient wearing a mask  CV: RRR without murmur, S1/S2, no JVD,, no peripheral edema  Resp: [place] expiratory wheezing bilaterally, normal RR and effort noted  GI: soft, no tenderness, with active bowel sounds.  He has a long midline scar with palpable subcutaneous mesh. ( There was a hernia repair in this area after prior surgery for trauma related splenectomy)  Skin; warm and dry, no rash or jaundice noted  Neuro: awake, alert and oriented x 3. Normal gross motor function and fluent speech.  Labs:  CBC Latest Ref Rng & Units 07/12/2018 04/23/2018 04/23/2018  WBC 4.0 - 10.5 K/uL 10.6(H) 11.7(H) 9.2  Hemoglobin 13.0 - 17.0 g/dL 13.9 9.3(L) 8.9(L)  Hematocrit 39.0 - 52.0 % 42.7 31.8(L) 28.8(L)  Platelets 150.0 - 400.0 K/uL 489.0(H) 424(H) 402(H)    CMP Latest Ref Rng & Units 04/23/2018 04/23/2018 04/23/2018  Glucose 70 - 99 mg/dL - 98 126(H)  BUN 8 - 23 mg/dL - 15 13  Creatinine 0.61 - 1.24 mg/dL 0.70 0.75 0.68  Sodium 135 - 145 mmol/L - 130(L) 134(L)  Potassium 3.5 - 5.1 mmol/L - 3.8 3.5  Chloride 98 - 111 mmol/L - 100 105  CO2 22 - 32 mmol/L - 22 23  Calcium 8.9 - 10.3 mg/dL - 8.3(L) 8.3(L)  Total Protein 6.5 - 8.1 g/dL - 6.0(L) -  Total Bilirubin  0.3 - 1.2 mg/dL - 0.3 -  Alkaline Phos 38 - 126 U/L - 60 -  AST 15 - 41 U/L - 18 -  ALT 0 - 44 U/L - 12 -    No results for input(s): INR in the last 168 hours.  Radiologic studies:  None yet today @ASSESSMENTPLANBEGIN @ Impression:  Hematochezia Colonic diverticulosis Cerebrovascular disease with prior CVA and TIA, maintained on aspirin and Plavix. Orthostasis and probable vagal episode during passage of hematochezia shortly before arrival.   It seems he may still have ongoing bleeding, which could be our opportunity to catch it on an imaging study and perhaps sent him to interventional radiology. Plan:  His labs are being drawn now, and results should be back stat.  I discussed this case with him and his wife at length, and also with Dr. Quintella Reichert of the emergency department staff.  If creatinine is normal, patient should go for a stat CT angiogram of the abdomen.  If active bleeding is seen, interventional radiology should be immediately consulted and request made for an angiogram to hopefully embolize any identified bleeding source.  Clearly, the patient should be taken off aspirin and Plavix during this hospital stay.  Serial hemoglobin and hematocrit should be done and transfuse as needed for hemoglobin less than or equal to 7  I will speak with Dr. Oretha Caprice, who will assume the consult service tomorrow, and also Dr. Silvano Rusk, who is on-call for our group tonight, making them aware of the proposed plan.  Thank you for the courtesy of this consult.  Please contact me with any questions or concerns.  [name] Office: [phone]

## 2018-08-21 NOTE — ED Provider Notes (Signed)
[facility] EMERGENCY DEPARTMENT Provider Note   CSN: [id number] Arrival date & time: 08/21/18  1519    History   Chief Complaint Chief Complaint  Patient presents with   GI Bleeding   Near Syncope    HPI [name] is a 85 y.o. male.     The history is provided by the patient, the EMS personnel, a relative and medical records. No language interpreter was used.  Near Syncope   [name] is a 85 y.o. male who presents to the Emergency Department complaining of G.I. bleed. He presents to the emergency department by EMS for evaluation of G.I. bleed and syncopal event. [facility] independent living with his wife. This morning he had five blood he bowel movements and had a syncopal event. He did not hit his head. He has experienced similar episodes in the past related to G.I. bleed. He denies any shortness of breath but he does have some dizziness. He has a cough, which he states is chronic. No known coronavirus exposures. He denies any fevers, chest pain, abdominal pain. On EMS arrival he was noted to be hypotensive with the blood pressure of 80 systolic. Past Medical History:  Diagnosis Date   Acute CVA (cerebrovascular accident) ([place]) 07/25/2015   Asthma    Benign essential HTN    Cerebral infarction due to embolism of left middle cerebral artery ([place]) 02/03/2014   Chronic combined systolic and diastolic CHF (congestive heart failure) ([place]) 12/23/2017   Colitis    Colon polyps    Diabetes mellitus type 2, diet-controlled ([place]) 12/22/2013   Diverticulosis    GERD (gastroesophageal reflux disease)    GI bleed    Hemorrhoids    Hiatal hernia    History of esophageal strciture    HLD (hyperlipidemia)    Osteoarthritis    Proctitis    Status post dilation of esophageal narrowing    Stroke [facility])     Patient Active Problem List   Diagnosis Date Noted   Platelet inhibition due to Plavix    Rectal bleeding     Diverticulosis large intestine w/o perforation or abscess w/bleeding    GERD (gastroesophageal reflux disease) 04/21/2018   Hematochezia    Hypokalemia 12/25/2017   Diverticulosis    Acute blood loss anemia 12/24/2017   Syncope due to orthostatic hypotension 12/23/2017   Chronic combined systolic and diastolic CHF (congestive heart failure) ([place]) 12/23/2017   GI bleed 10/25/2016   BPPV (benign paroxysmal positional vertigo) 10/25/2016   Lower GI bleed 10/24/2016   Syncope 10/24/2016   TIA (transient ischemic attack) 07/22/2016   Benign essential HTN    PAF (paroxysmal atrial fibrillation) ([facility])    Stroke-like symptom 02/22/2016   Leukocytosis 02/22/2016   Acute lower GI bleeding 02/01/2016   History of lower GI bleeding Dec 2017 02/01/2016   Chronic anticoagulation 2/2 history of CVA 01/31/2016   History of CVA (cerebrovascular accident) 01/31/2016   Sinusitis, chronic 12/12/2015   Cough variant asthma 10/31/2015   Insomnia 07/25/2015   HLD (hyperlipidemia) 02/03/2014   Ulnar neuropathy at elbow of right upper extremity 02/03/2014   Cervical radiculopathy 02/03/2014   Seizures ([place])    Diabetes mellitus type II, non insulin dependent ([place]) 12/22/2013   Recurrent ventral hernia 12/15/2011   History of esophageal strciture    Diverticulosis of colon with hemorrhage 06/24/2007    Past Surgical History:  Procedure Laterality Date   ABDOMINAL HERNIA REPAIR     COLONOSCOPY  multiple   COLONOSCOPY WITH PROPOFOL N/A 12/12/2016   Procedure: COLONOSCOPY WITH PROPOFOL;  Surgeon: Gatha Mayer, MD;  Location: [facility] ENDOSCOPY;  Service: Endoscopy;  Laterality: N/A;   ESOPHAGOGASTRODUODENOSCOPY     multiple   INSERTION OF MESH  01/29/2012   Procedure: INSERTION OF MESH;  Surgeon: Gwenyth Ober, MD;  Location: [place];  Service: General;  Laterality: N/A;   NISSEN FUNDOPLICATION     SPLENECTOMY, TOTAL     nontraumatic rupture   VENTRAL HERNIA  REPAIR  01/29/2012    WITH MESH   VENTRAL HERNIA REPAIR  01/29/2012   Procedure: HERNIA REPAIR VENTRAL ADULT;  Surgeon: Gwenyth Ober, MD;  Location: [place];  Service: General;  Laterality: N/A;  open recurrent ventral hernia repair with mesh        Home Medications    Prior to Admission medications   Medication Sig Start Date End Date Taking? Authorizing Provider  albuterol (PROAIR HFA) 108 (90 Base) MCG/ACT inhaler Inhale 2 puffs into the lungs every 6 (six) hours as needed for wheezing or shortness of breath.    [provider]  aspirin EC 81 MG EC tablet Take 1 tablet (81 mg total) by mouth daily. 04/25/18   Monica Becton, MD  carvedilol (COREG) 3.125 MG tablet Take 1 tablet (3.125 mg total) by mouth 2 (two) times daily with a meal. 02/26/16   Amin, Jeanella Flattery, MD  doxycycline (VIBRAMYCIN) 50 MG capsule Take 2 capsules (100 mg total) by mouth 2 (two) times daily. Take with food and plenty of water. Sit upright 30 min after taking the pill 04/24/18   Monica Becton, MD  ferrous sulfate 325 (65 FE) MG tablet Take 1 tablet (325 mg total) by mouth 2 (two) times daily with a meal. 02/07/18   Thurnell Lose, MD  fluticasone (FLONASE) 50 MCG/ACT nasal spray Place 1 spray into both nostrils every morning.  06/18/14   [provider]  guaiFENesin (MUCINEX) 600 MG 12 hr tablet Take 600-1,200 mg by mouth daily as needed for cough or to loosen phlegm.     [provider]  Melatonin 5 MG TABS Take 5 mg by mouth at bedtime.    [provider]  metFORMIN (GLUCOPHAGE) 1000 MG tablet Take 1,000 mg by mouth daily with breakfast.    [provider]  mometasone-formoterol (DULERA) 100-5 MCG/ACT AERO Inhale 2 puffs into the lungs 2 (two) times daily. Patient taking differently: Inhale 1 puff into the lungs 2 (two) times daily.  12/12/15   Tanda Rockers, MD  polyethylene glycol (MIRALAX / GLYCOLAX) packet Take 17 g by mouth daily.    [provider]  predniSONE (DELTASONE) 20 MG tablet Take 1 tablet (20 mg total) by mouth 2 (two) times daily with a meal. 04/24/18   Vasireddy, Grier Mitts, MD  psyllium (METAMUCIL) 58.6 % powder Take 1 packet by mouth every morning.     [provider]  rOPINIRole (REQUIP) 0.5 MG tablet Take 2 mg by mouth at bedtime.     [provider]  simvastatin (ZOCOR) 40 MG tablet Take 1 tablet (40 mg total) by mouth daily. Patient taking differently: Take 40 mg by mouth at bedtime.  02/26/16   Damita Lack, MD    Family History Family History  Problem Relation Age of Onset   Alzheimer's disease Mother    Breast cancer Mother    Throat cancer Father     Social History Social History   Tobacco  Use   Smoking status: Never Smoker   Smokeless tobacco: Never Used  Substance Use Topics   Alcohol use: Yes    Alcohol/week: 1.0 standard drinks    Types: 1 Glasses of wine per week    Comment: daily   Drug use: No     Allergies   Tape and Azithromycin   Review of Systems Review of Systems  Cardiovascular: Positive for near-syncope.  All other systems reviewed and are negative.    Physical Exam Updated Vital Signs BP 130/89    Pulse 99    Temp 97.8 F (36.6 C) (Oral)    Resp (!) 26    Ht 5\' 10"  (1.778 m)    Wt 71.7 kg    SpO2 98%    BMI 22.68 kg/m   Physical Exam Vitals signs and nursing note reviewed.  Constitutional:      Appearance: He is well-developed.  HENT:     Head: Normocephalic and atraumatic.  Cardiovascular:     Rate and Rhythm: Normal rate and regular rhythm.  Pulmonary:     Effort: Pulmonary effort is normal. No respiratory distress.  Abdominal:     Palpations: Abdomen is soft.     Tenderness: There is no abdominal tenderness. There is no guarding or rebound.  Genitourinary:    Comments: Nontender rectal examination with bright red blood in the rectal vault Musculoskeletal:        General: No tenderness.  Skin:    General: Skin is warm and dry.      Coloration: Skin is pale.  Neurological:     Mental Status: He is alert and oriented to person, place, and time.     Comments: Mildly confused. Generalized weakness.  Psychiatric:        Mood and Affect: Mood normal.        Behavior: Behavior normal.      ED Treatments / Results  Labs (all labs ordered are listed, but only abnormal results are displayed) Labs Reviewed  COMPREHENSIVE METABOLIC PANEL - Abnormal; Notable for the following components:      Result Value   Glucose, Bld 135 (*)    Calcium 8.5 (*)    Total Protein 6.0 (*)    Albumin 3.4 (*)    All other components within normal limits  CBC WITH DIFFERENTIAL/PLATELET - Abnormal; Notable for the following components:   WBC 20.6 (*)    RDW 22.0 (*)    Neutro Abs 15.1 (*)    Monocytes Absolute 1.5 (*)    Eosinophils Absolute 1.2 (*)    Abs Immature Granulocytes 0.14 (*)    All other components within normal limits  CBG MONITORING, ED - Abnormal; Notable for the following components:   Glucose-Capillary 141 (*)    All other components within normal limits  I-STAT CHEM 8, ED - Abnormal; Notable for the following components:   Glucose, Bld 130 (*)    Calcium, Ion 1.11 (*)    All other components within normal limits  SARS CORONAVIRUS 2 (HOSPITAL ORDER, [facility])  PROTIME-INR  TYPE AND SCREEN    EKG None  Radiology Dg Chest 1 View  Result Date: 08/21/2018 CLINICAL DATA:  Syncope EXAM: CHEST  1 VIEW COMPARISON:  April 24, 2018 FINDINGS: The heart size and mediastinal contours are stable. There is no focal infiltrate, pulmonary edema, or pleural effusion. The visualized skeletal structures are unremarkable. IMPRESSION: No active cardiopulmonary disease. Electronically Signed   By: Mallie Darting.D.  On: 08/21/2018 17:15   Ct Angio Abd/pel W/ And/or W/o  Result Date: 08/21/2018 CLINICAL DATA:  Rectal bleeding.  Concern for GI bleed. EXAM: CTA ABDOMEN AND PELVIS wITHOUT AND WITH CONTRAST  TECHNIQUE: Multidetector CT imaging of the abdomen and pelvis was performed using the standard protocol during bolus administration of intravenous contrast. Multiplanar reconstructed images and MIPs were obtained and reviewed to evaluate the vascular anatomy. CONTRAST:  148mL OMNIPAQUE IOHEXOL 350 MG/ML SOLN COMPARISON:  Nuclear medicine GI bleeding study of 04/22/2018. 02/04/2018 abdominopelvic CT. FINDINGS: VASCULAR Aorta: Atherosclerosis within.  No aneurysm. Celiac: Widely patent SMA: Atherosclerosis within. Renals: Atherosclerotic irregularity of the origin of the right renal. Left renal is widely patent. IMA: Patent. Inflow: Atherosclerosis without significant stenosis. Right common iliac artery ectasia at 1.6 cm on 131/13. Veins: Not well evaluated. Review of the MIP images confirms the above findings. NON-VASCULAR Lower chest: Right middle lobe scarring. Normal heart size without pericardial or pleural effusion. Small to moderate hiatal hernia with surgical changes at the proximal stomach. Hepatobiliary: Extensive artifact from EKG wires and leads, arm position. Normal liver. Normal gallbladder, without biliary ductal dilatation. Pancreas: Pancreatic atrophy is likely within normal variation for age. Spleen: Splenectomy. Adrenals/Urinary Tract: Normal adrenal glands. Hyperattenuation in the renal collecting systems prior to contrast is likely due to inadvertent contrast administration. Bilateral too small to characterize renal lesions. Interpolar left renal 1.6 cm cyst or minimally complex cyst. Normal urinary bladder. Stomach/Bowel: Normal distal stomach. Extensive colonic diverticulosis. Mild pericolonic edema is suspected, including on image 103/13. Normal terminal ileum. No sites of active extravasation identified. Normal small bowel. Lymphatic: No abdominopelvic adenopathy. Reproductive: Normal prostate. Other: No significant free fluid. No free intraperitoneal air. Extensive surgical changes about the  anterior upper abdominal wall. Musculoskeletal: Right hip osteoarthritis.  Lumbosacral spondylosis. IMPRESSION: VASCULAR 1. Atherosclerosis, without acute finding. 2. Right common iliac artery ectasia. NON-VASCULAR 1. Descending colonic diverticulosis with suspicion of mild diverticulitis. 2. No evidence of active extravasation to localize GI bleeding. 3. Decreased sensitivity and specificity exam due to technique related factors, as described above. 4. Hiatal hernia. Electronically Signed   By: Abigail Miyamoto M.D.   On: 08/21/2018 18:45    Procedures Procedures (including critical care time) CRITICAL CARE Performed by: Quintella Reichert   Total critical care time: 35 minutes  Critical care time was exclusive of separately billable procedures and treating other patients.  Critical care was necessary to treat or prevent imminent or life-threatening deterioration.  Critical care was time spent personally by me on the following activities: development of treatment plan with patient and/or surrogate as well as nursing, discussions with consultants, evaluation of patient's response to treatment, examination of patient, obtaining history from patient or surrogate, ordering and performing treatments and interventions, ordering and review of laboratory studies, ordering and review of radiographic studies, pulse oximetry and re-evaluation of patient's condition.  Medications Ordered in ED Medications  sodium chloride 0.9 % bolus 500 mL (has no administration in time range)  iohexol (OMNIPAQUE) 350 MG/ML injection 100 mL (100 mLs Intravenous Contrast Given 08/21/18 1738)     Initial Impression / Assessment and Plan / ED Course  I have reviewed the triage vital signs and the nursing notes.  Pertinent labs & imaging results that were available during my care of the patient were reviewed by me and considered in my medical decision making (see chart for details).        Patient with history of  diverticular bleed here for evaluation of hematochezia and  syncope/near syncopal event prior to ED arrival. He does have significant blood on rectal examination. He does have mild tachycardia as well as ortho stasis as well. Hemoglobin is stable when compared to priors. CBC is significant for leukocytosis, recently completed a steroid course for reactive airway. He was evaluated by Dr. Loletha Carrow with Vel[facility] Heckler G.I. in the emergency department. Plan to obtain CTA to evaluate for active diverticular bleed.  CTA without clear identifiable source for his bleed. Plan to admit for further observation. He did have an additional bowel movement with approximately 300 ML's of gross blood. Hospitalist consulted for admission. Discussed with Dr. Carlean Purl with G.I. findings of CTA. Patient and wife updated findings of studies and recommendation for admission and they are in agreement with treatment plan.  Final Clinical Impressions(s) / ED Diagnoses   Final diagnoses:  Syncope  Lower GI bleed    ED Discharge Orders    None       Quintella Reichert, MD 08/21/18 [phone]

## 2018-08-21 NOTE — ED Triage Notes (Signed)
Pt here via [facility] from [facility], an independent living facility.  He has a hx of GI bleeding, but today he had 5  Near-syncope episodes.  Denies falls. When EMS initially arrived on scene, pt was diaphoretic and initial vs were:  80/60 bp 79 hr 98% RA 164 cbg 97.2 temp  Pt bp improved after laying flat and having legs raised.

## 2018-08-22 DIAGNOSIS — R569 Unspecified convulsions: Secondary | ICD-10-CM

## 2018-08-22 DIAGNOSIS — I10 Essential (primary) hypertension: Secondary | ICD-10-CM

## 2018-08-22 DIAGNOSIS — I5042 Chronic combined systolic (congestive) and diastolic (congestive) heart failure: Secondary | ICD-10-CM | POA: Diagnosis not present

## 2018-08-22 DIAGNOSIS — E119 Type 2 diabetes mellitus without complications: Secondary | ICD-10-CM | POA: Diagnosis not present

## 2018-08-22 DIAGNOSIS — K5791 Diverticulosis of intestine, part unspecified, without perforation or abscess with bleeding: Secondary | ICD-10-CM

## 2018-08-22 DIAGNOSIS — K5731 Diverticulosis of large intestine without perforation or abscess with bleeding: Secondary | ICD-10-CM | POA: Diagnosis not present

## 2018-08-22 DIAGNOSIS — I480 Paroxysmal atrial fibrillation: Secondary | ICD-10-CM

## 2018-08-22 DIAGNOSIS — R55 Syncope and collapse: Secondary | ICD-10-CM

## 2018-08-22 DIAGNOSIS — K625 Hemorrhage of anus and rectum: Secondary | ICD-10-CM

## 2018-08-22 DIAGNOSIS — K922 Gastrointestinal hemorrhage, unspecified: Secondary | ICD-10-CM | POA: Diagnosis not present

## 2018-08-22 LAB — CBC WITH DIFFERENTIAL/PLATELET
Abs Immature Granulocytes: 0.05 10*3/uL (ref 0.00–0.07)
Abs Immature Granulocytes: 0.05 10*3/uL (ref 0.00–0.07)
Abs Immature Granulocytes: 0 10*3/uL (ref 0.00–0.07)
Basophils Absolute: 0.1 10*3/uL (ref 0.0–0.1)
Basophils Absolute: 0.1 10*3/uL (ref 0.0–0.1)
Basophils Absolute: 0.1 10*3/uL (ref 0.0–0.1)
Basophils Relative: 1 %
Basophils Relative: 1 %
Basophils Relative: 1 %
Eosinophils Absolute: 0.5 10*3/uL (ref 0.0–0.5)
Eosinophils Absolute: 0.8 10*3/uL — ABNORMAL HIGH (ref 0.0–0.5)
Eosinophils Absolute: 1.3 10*3/uL — ABNORMAL HIGH (ref 0.0–0.5)
Eosinophils Relative: 4 %
Eosinophils Relative: 7 %
Eosinophils Relative: 13 %
HCT: 38.7 % — ABNORMAL LOW (ref 39.0–52.0)
HCT: 32.4 % — ABNORMAL LOW (ref 39.0–52.0)
HCT: 31.6 % — ABNORMAL LOW (ref 39.0–52.0)
Hemoglobin: 11.9 g/dL — ABNORMAL LOW (ref 13.0–17.0)
Hemoglobin: 10.1 g/dL — ABNORMAL LOW (ref 13.0–17.0)
Hemoglobin: 9.9 g/dL — ABNORMAL LOW (ref 13.0–17.0)
Immature Granulocytes: 0 %
Immature Granulocytes: 1 %
Lymphocytes Relative: 21 %
Lymphocytes Relative: 27 %
Lymphocytes Relative: 14 %
Lymphs Abs: 2.7 10*3/uL (ref 0.7–4.0)
Lymphs Abs: 3 10*3/uL (ref 0.7–4.0)
Lymphs Abs: 1.4 10*3/uL (ref 0.7–4.0)
MCH: 28.1 pg (ref 26.0–34.0)
MCH: 27.9 pg (ref 26.0–34.0)
MCH: 28.6 pg (ref 26.0–34.0)
MCHC: 30.7 g/dL (ref 30.0–36.0)
MCHC: 31.2 g/dL (ref 30.0–36.0)
MCHC: 31.3 g/dL (ref 30.0–36.0)
MCV: 91.3 fL (ref 80.0–100.0)
MCV: 89.5 fL (ref 80.0–100.0)
MCV: 91.3 fL (ref 80.0–100.0)
Monocytes Absolute: 1.1 10*3/uL — ABNORMAL HIGH (ref 0.1–1.0)
Monocytes Absolute: 1.2 10*3/uL — ABNORMAL HIGH (ref 0.1–1.0)
Monocytes Absolute: 0.7 10*3/uL (ref 0.1–1.0)
Monocytes Relative: 8 %
Monocytes Relative: 11 %
Monocytes Relative: 7 %
Neutro Abs: 8.4 10*3/uL — ABNORMAL HIGH (ref 1.7–7.7)
Neutro Abs: 6 10*3/uL (ref 1.7–7.7)
Neutro Abs: 6.4 10*3/uL (ref 1.7–7.7)
Neutrophils Relative %: 66 %
Neutrophils Relative %: 53 %
Neutrophils Relative %: 65 %
Platelets: 343 10*3/uL (ref 150–400)
Platelets: 302 10*3/uL (ref 150–400)
Platelets: 308 10*3/uL (ref 150–400)
RBC: 4.24 MIL/uL (ref 4.22–5.81)
RBC: 3.62 MIL/uL — ABNORMAL LOW (ref 4.22–5.81)
RBC: 3.46 MIL/uL — ABNORMAL LOW (ref 4.22–5.81)
RDW: 21.9 % — ABNORMAL HIGH (ref 11.5–15.5)
RDW: 21.4 % — ABNORMAL HIGH (ref 11.5–15.5)
RDW: 21.8 % — ABNORMAL HIGH (ref 11.5–15.5)
WBC: 12.8 10*3/uL — ABNORMAL HIGH (ref 4.0–10.5)
WBC: 11.1 10*3/uL — ABNORMAL HIGH (ref 4.0–10.5)
WBC: 9.9 10*3/uL (ref 4.0–10.5)
nRBC: 0 % (ref 0.0–0.2)
nRBC: 0 % (ref 0.0–0.2)
nRBC: 0 % (ref 0.0–0.2)
nRBC: 0 /100 WBC

## 2018-08-22 LAB — COMPREHENSIVE METABOLIC PANEL
ALT: 10 U/L (ref 0–44)
AST: 16 U/L (ref 15–41)
Albumin: 2.7 g/dL — ABNORMAL LOW (ref 3.5–5.0)
Alkaline Phosphatase: 48 U/L (ref 38–126)
Anion gap: 8 (ref 5–15)
BUN: 14 mg/dL (ref 8–23)
CO2: 24 mmol/L (ref 22–32)
Calcium: 8 mg/dL — ABNORMAL LOW (ref 8.9–10.3)
Chloride: 104 mmol/L (ref 98–111)
Creatinine, Ser: 0.82 mg/dL (ref 0.61–1.24)
GFR calc Af Amer: >60 mL/min (ref >60)
GFR calc non Af Amer: >60 mL/min (ref >60)
Glucose, Bld: 161 mg/dL — ABNORMAL HIGH (ref 70–99)
Potassium: 3.8 mmol/L (ref 3.5–5.1)
Sodium: 136 mmol/L (ref 135–145)
Total Bilirubin: 0.6 mg/dL (ref 0.3–1.2)
Total Protein: 4.8 g/dL — ABNORMAL LOW (ref 6.5–8.1)

## 2018-08-22 LAB — GLUCOSE, CAPILLARY
Glucose-Capillary: 89 mg/dL (ref 70–99)
Glucose-Capillary: 109 mg/dL — ABNORMAL HIGH (ref 70–99)

## 2018-08-22 LAB — MRSA PCR SCREENING: MRSA by PCR: POSITIVE — AB

## 2018-08-22 MED ORDER — AMOXICILLIN-POT CLAVULANATE 875-125 MG PO TABS: 1.0000 | ORAL_TABLET | Freq: Three times a day (TID) | ORAL | 0 refills | Status: AC

## 2018-08-22 MED ORDER — CHLORHEXIDINE GLUCONATE CLOTH 2 % EX PADS: 6.0000 | MEDICATED_PAD | Freq: Every day | CUTANEOUS | Status: DC

## 2018-08-22 MED ORDER — ZOLPIDEM TARTRATE 5 MG PO TABS
2.5000 mg | ORAL_TABLET | Freq: Once | ORAL | Status: AC
  Administered 2018-08-22: 2.5 mg via ORAL
  Filled 2018-08-22: qty 1

## 2018-08-22 MED ORDER — AMOXICILLIN-POT CLAVULANATE 875-125 MG PO TABS: 1.0000 | ORAL_TABLET | Freq: Three times a day (TID) | ORAL | Status: DC

## 2018-08-22 MED ORDER — AMOXICILLIN-POT CLAVULANATE 875-125 MG PO TABS: 1.0000 | ORAL_TABLET | Freq: Two times a day (BID) | ORAL | Status: DC

## 2018-08-22 MED ORDER — PANTOPRAZOLE SODIUM 40 MG PO TBEC
40.0000 mg | DELAYED_RELEASE_TABLET | Freq: Every day | ORAL | Status: DC
  Administered 2018-08-22: 40 mg via ORAL

## 2018-08-22 MED ORDER — MUPIROCIN 2 % EX OINT
1.0000 "application " | TOPICAL_OINTMENT | Freq: Two times a day (BID) | CUTANEOUS | Status: DC
  Administered 2018-08-22: 1 via NASAL
  Filled 2018-08-22: qty 22

## 2018-08-22 NOTE — Care Management Obs Status (Signed)
Rafael Hernandez NOTIFICATION   Patient Details  Name: [name] MRN: [id number] Date of Birth: 06-11-1931   Medicare Observation Status Notification Given:  Yes    Bartholomew Crews, RN 08/22/2018, 1:01 PM

## 2018-08-22 NOTE — Progress Notes (Signed)
New Admission Note:  Arrival Method: Pt arrived by bed from ED around 2030 Mental Orientation: Alert and oriented x 4 Telemetry: Box 4, CCMD notified Assessment: Completed Skin: Completed, refer to flowsheets IV: R A.C. and L forearm Pain: 3/10 abdominal pain, mild per pt Tubes: None Safety Measures: Safety Fall Prevention Plan was given, discussed and signed. Admission: Completed [facility] Orientation: Patient has been orientated to the room, unit and the staff. Family: None  Orders have been reviewed and implemented. Will continue to monitor the patient. Call light has been placed within reach and bed alarm has been activated. Pt put on low bed d/t Plavix and syncope.  Perry Mount, RN  Phone Number: [phone]

## 2018-08-22 NOTE — Progress Notes (Signed)
[place] to be discharged Home per MD order. Discussed prescriptions and follow up appointments with the patient. Prescriptions given to patient; medication list explained in detail. Patient verbalized understanding.  Skin clean, dry and intact without evidence of skin break down, no evidence of skin tears noted. IV catheter discontinued intact. Site without signs and symptoms of complications. Dressing and pressure applied. Pt denies pain at the site currently. No complaints noted.  Patient free of lines, drains, and wounds.   An After Visit Summary (AVS) was printed and given to the patient. Patient escorted via wheelchair, and discharged home via private auto.  Aneta Mins BSN, RN3

## 2018-08-22 NOTE — Consult Note (Addendum)
[facility] Surgery Consult Note  [name] 18-Dec-1931  [id number].    Requesting MD: Owens Loffler Chief Complaint/Reason for Consult: recurrent GI bleed  HPI:  [name] is an 85yo male PMH asthma, HTN, DM, GERD, CHF (EF 45% 02/2016), and h/o CVA 2018 on plavix (last dose 7/4) who was admitted to [facility] yesterday with hematochezia. Patient has a long history of recurrent lower GI bleeds, presumed to be from colonic diverticulosis. His last colonoscopy was in 2018 and showed showed many small and large-mouthed diverticula in the left colon.  He reports having a total of about 6-7 episodes requiring hospitalization in the last 10 years. This is his third episode in 7 months. This particular episode started on 7/4. He reports having bright red blood per rectum. Denies abdominal pain, nausea, or vomiting. CTA yesterday showed descending colonic diverticulosis with suspicion of mild diverticulitis and no evidence of active extravasation to localize GI bleeding.  Since admission the bleeding seems to have stopped. Hemoglobin is stable. He is hopeful to be discharged home today.  General surgery asked to see to discuss elective left hemicolectomy.  -Abdominal surgical history: splenectomy, laparoscopic ventral hernia repair with mesh 2008 Dr. Hulen Skains, recurrent ventral hernia repair with mesh 2013 Dr. Hulen Skains, laparoscopy repair paraesophageal hiatal hernia and Nissen fundoplication 4098 Dr. Zella Richer -Uses walker for ambulation -Lives in a retirement community with his wife in independent living  ROS: Review of Systems  Constitutional: Negative.  Negative for chills and fever.  HENT: Negative.   Eyes: Negative.   Respiratory: Negative.  Negative for cough and shortness of breath.   Cardiovascular: Negative.  Negative for chest pain and leg swelling.  Gastrointestinal: Positive for blood in stool. Negative for abdominal pain, nausea and vomiting.  Genitourinary: Negative.    Musculoskeletal: Negative.   Skin: Negative.   Neurological: Negative.    All systems reviewed and otherwise negative except for as above  Family History  Problem Relation Age of Onset  . Alzheimer's disease Mother   . Breast cancer Mother   . Throat cancer Father     Past Medical History:  Diagnosis Date  . Acute CVA (cerebrovascular accident) ([place]) 07/25/2015  . Asthma   . Benign essential HTN   . Cerebral infarction due to embolism of left middle cerebral artery ([place]) 02/03/2014  . Chronic combined systolic and diastolic CHF (congestive heart failure) ([facility]) 12/23/2017  . Colitis   . Colon polyps   . Diabetes mellitus type 2, diet-controlled ([place]) 12/22/2013  . Diverticulosis   . GERD (gastroesophageal reflux disease)   . GI bleed   . Hemorrhoids   . Hiatal hernia   . History of esophageal strciture   . HLD (hyperlipidemia)   . Osteoarthritis   . Proctitis   . Status post dilation of esophageal narrowing   . Stroke [facility])     Past Surgical History:  Procedure Laterality Date  . ABDOMINAL HERNIA REPAIR    . COLONOSCOPY     multiple  . COLONOSCOPY WITH PROPOFOL N/A 12/12/2016   Procedure: COLONOSCOPY WITH PROPOFOL;  Surgeon: Gatha Mayer, MD;  Location: [facility] ENDOSCOPY;  Service: Endoscopy;  Laterality: N/A;  . ESOPHAGOGASTRODUODENOSCOPY     multiple  . INSERTION OF MESH  01/29/2012   Procedure: INSERTION OF MESH;  Surgeon: Gwenyth Ober, MD;  Location: [place];  Service: General;  Laterality: N/A;  . NISSEN FUNDOPLICATION    . SPLENECTOMY, TOTAL     nontraumatic rupture  . VENTRAL HERNIA REPAIR  01/29/2012    WITH MESH  . VENTRAL HERNIA REPAIR  01/29/2012   Procedure: HERNIA REPAIR VENTRAL ADULT;  Surgeon: Gwenyth Ober, MD;  Location: [place];  Service: General;  Laterality: N/A;  open recurrent ventral hernia repair with mesh    Social History:  reports that he has never smoked. He has never used smokeless tobacco. He reports current alcohol use of about 1.0  standard drinks of alcohol per week. He reports that he does not use drugs.  Allergies:  Allergies  Allergen Reactions  . Tape Other (See Comments)    SKIN IS SENSITIVE; PLEASE USE PAPER TAPE; SKIN BRUISES AND TEARS EASILY!!  . Azithromycin Rash and Other (See Comments)    Pt had a ZPak Jan 2020 - no reaction    Medications Prior to Admission  Medication Sig Dispense Refill  . albuterol (PROAIR HFA) 108 (90 Base) MCG/ACT inhaler Inhale 2 puffs into the lungs every 6 (six) hours as needed for wheezing or shortness of breath.    . carvedilol (COREG) 3.125 MG tablet Take 1 tablet (3.125 mg total) by mouth 2 (two) times daily with a meal. 60 tablet 1  . clopidogrel (PLAVIX) 75 MG tablet Take 75 mg by mouth daily.    . diphenhydramine-acetaminophen (TYLENOL PM) 25-500 MG TABS tablet Take 2 tablets by mouth at bedtime as needed (sleep).    . ferrous sulfate 325 (65 FE) MG tablet Take 1 tablet (325 mg total) by mouth 2 (two) times daily with a meal. 60 tablet 0  . fluticasone (FLONASE) 50 MCG/ACT nasal spray Place 1 spray into both nostrils every morning.   5  . guaiFENesin (MUCINEX) 600 MG 12 hr tablet Take 600-1,200 mg by mouth daily as needed for cough or to loosen phlegm.     . Melatonin 5 MG TABS Take 5 mg by mouth at bedtime.    . metFORMIN (GLUCOPHAGE) 1000 MG tablet Take 1,000 mg by mouth daily with breakfast.    . mometasone-formoterol (DULERA) 100-5 MCG/ACT AERO Inhale 2 puffs into the lungs 2 (two) times daily. (Patient taking differently: Inhale 1 puff into the lungs 2 (two) times daily. ) 1 Inhaler 0  . montelukast (SINGULAIR) 10 MG tablet Take 10 mg by mouth at bedtime.    . polyethylene glycol (MIRALAX / GLYCOLAX) packet Take 17 g by mouth daily.    . psyllium (METAMUCIL) 58.6 % powder Take 1 packet by mouth every morning.     Marland Kitchen rOPINIRole (REQUIP) 0.5 MG tablet Take 2 mg by mouth at bedtime.     . simvastatin (ZOCOR) 40 MG tablet Take 1 tablet (40 mg total) by mouth daily.  (Patient taking differently: Take 40 mg by mouth at bedtime. ) 30 tablet 3  . zolpidem (AMBIEN) 10 MG tablet Take 5 mg by mouth at bedtime as needed for sleep.      Prior to Admission medications   Medication Sig Start Date End Date Taking? Authorizing Provider  albuterol (PROAIR HFA) 108 (90 Base) MCG/ACT inhaler Inhale 2 puffs into the lungs every 6 (six) hours as needed for wheezing or shortness of breath.   Yes [provider]  carvedilol (COREG) 3.125 MG tablet Take 1 tablet (3.125 mg total) by mouth 2 (two) times daily with a meal. 02/26/16  Yes Amin, Ankit Chirag, MD  clopidogrel (PLAVIX) 75 MG tablet Take 75 mg by mouth daily. 07/05/18  Yes [provider]  diphenhydramine-acetaminophen (TYLENOL PM) 25-500 MG TABS tablet Take 2 tablets by  mouth at bedtime as needed (sleep).   Yes [provider]  ferrous sulfate 325 (65 FE) MG tablet Take 1 tablet (325 mg total) by mouth 2 (two) times daily with a meal. 02/07/18  Yes Thurnell Lose, MD  fluticasone (FLONASE) 50 MCG/ACT nasal spray Place 1 spray into both nostrils every morning.  06/18/14  Yes [provider]  guaiFENesin (MUCINEX) 600 MG 12 hr tablet Take 600-1,200 mg by mouth daily as needed for cough or to loosen phlegm.    Yes [provider]  Melatonin 5 MG TABS Take 5 mg by mouth at bedtime.   Yes [provider]  metFORMIN (GLUCOPHAGE) 1000 MG tablet Take 1,000 mg by mouth daily with breakfast.   Yes [provider]  mometasone-formoterol (DULERA) 100-5 MCG/ACT AERO Inhale 2 puffs into the lungs 2 (two) times daily. Patient taking differently: Inhale 1 puff into the lungs 2 (two) times daily.  12/12/15  Yes Tanda Rockers, MD  montelukast (SINGULAIR) 10 MG tablet Take 10 mg by mouth at bedtime.   Yes [provider]  polyethylene glycol (MIRALAX / GLYCOLAX) packet Take 17 g by mouth daily.   Yes [provider]  psyllium (METAMUCIL) 58.6 % powder Take 1  packet by mouth every morning.    Yes [provider]  rOPINIRole (REQUIP) 0.5 MG tablet Take 2 mg by mouth at bedtime.    Yes [provider]  simvastatin (ZOCOR) 40 MG tablet Take 1 tablet (40 mg total) by mouth daily. Patient taking differently: Take 40 mg by mouth at bedtime.  02/26/16  Yes Amin, Ankit Chirag, MD  zolpidem (AMBIEN) 10 MG tablet Take 5 mg by mouth at bedtime as needed for sleep.   Yes [provider]    Blood pressure 126/71, pulse 79, temperature 98 F (36.7 C), temperature source Oral, resp. rate 18, height 5\' 10"  (1.778 m), weight 71.8 kg, SpO2 98 %. Physical Exam: General: pleasant, WD/WN white male who is laying in bed in NAD HEENT: head is normocephalic, atraumatic.  Sclera are noninjected.  Pupils equal and round.  Ears and nose without any masses or lesions.  Mouth is pink and moist. Dentition fair Heart: regular, rate, and rhythm.  No obvious murmurs, gallops, or rubs noted.  Palpable pedal pulses bilaterally Lungs: CTAB, no wheezes, rhonchi, or rales noted.  Respiratory effort nonlabored Abd: well healed large midline and multiple lap incisions, soft, protuberant, nontender, +BS, soft/reducible periumbilical hernia, no HSM Msk: no BLE/BUE. Trace LLE weakness compared to RLE. UE strength equal bilaterally Skin: warm and dry with no masses, lesions, or rashes Psych: A&Ox3 with an appropriate affect. Neuro: cranial nerves grossly intact, normal speech  Results for orders placed or performed during the hospital encounter of 08/21/18 (from the past 48 hour(s))  CBG monitoring, ED     Status: Abnormal   Collection Time: 08/21/18  3:39 PM  Result Value Ref Range   Glucose-Capillary 141 (H) 70 - 99 mg/dL   Comment 1 Notify RN    Comment 2 Document in Chart   Comprehensive metabolic panel     Status: Abnormal   Collection Time: 08/21/18  3:50 PM  Result Value Ref Range   Sodium 137 135 - 145 mmol/L   Potassium 4.5 3.5 - 5.1 mmol/L    Chloride 106 98 - 111 mmol/L   CO2 23 22 - 32 mmol/L   Glucose, Bld 135 (H) 70 - 99 mg/dL   BUN 13 8 - 23  mg/dL   Creatinine, Ser 0.79 0.61 - 1.24 mg/dL   Calcium 8.5 (L) 8.9 - 10.3 mg/dL   Total Protein 6.0 (L) 6.5 - 8.1 g/dL   Albumin 3.4 (L) 3.5 - 5.0 g/dL   AST 17 15 - 41 U/L   ALT 11 0 - 44 U/L   Alkaline Phosphatase 62 38 - 126 U/L   Total Bilirubin 0.8 0.3 - 1.2 mg/dL   GFR calc non Af Amer >60 >60 mL/min   GFR calc Af Amer >60 >60 mL/min   Anion gap 8 5 - 15    Comment: Performed at [facility] [address], [place], [place] [address]  CBC WITH DIFFERENTIAL     Status: Abnormal   Collection Time: 08/21/18  3:50 PM  Result Value Ref Range   WBC 20.6 (H) 4.0 - 10.5 K/uL   RBC 4.83 4.22 - 5.81 MIL/uL   Hemoglobin 13.4 13.0 - 17.0 g/dL   HCT 44.6 39.0 - 52.0 %   MCV 92.3 80.0 - 100.0 fL   MCH 27.7 26.0 - 34.0 pg   MCHC 30.0 30.0 - 36.0 g/dL   RDW 22.0 (H) 11.5 - 15.5 %   Platelets 388 150 - 400 K/uL   nRBC 0.0 0.0 - 0.2 %   Neutrophils Relative % 73 %   Neutro Abs 15.1 (H) 1.7 - 7.7 K/uL   Lymphocytes Relative 12 %   Lymphs Abs 2.5 0.7 - 4.0 K/uL   Monocytes Relative 7 %   Monocytes Absolute 1.5 (H) 0.1 - 1.0 K/uL   Eosinophils Relative 6 %   Eosinophils Absolute 1.2 (H) 0.0 - 0.5 K/uL   Basophils Relative 1 %   Basophils Absolute 0.1 0.0 - 0.1 K/uL   Immature Granulocytes 1 %   Abs Immature Granulocytes 0.14 (H) 0.00 - 0.07 K/uL    Comment: Performed at [facility], [address] [place], [place] [address]  Protime-INR     Status: None   Collection Time: 08/21/18  3:50 PM  Result Value Ref Range   Prothrombin Time 12.9 11.4 - 15.2 seconds   INR 1.0 0.8 - 1.2    Comment: (NOTE) INR goal varies based on device and disease states. Performed at [facility], [place] [address], [place], [place] [address]   SARS Coronavirus 2 (CEPHEID - Performed in [place] hospital lab), Hosp Order     Status: None   Collection Time: 08/21/18  3:51 PM    Specimen: Nasopharyngeal Swab  Result Value Ref Range   SARS Coronavirus 2 NEGATIVE NEGATIVE    Comment: (NOTE) If result is NEGATIVE SARS-CoV-2 target nucleic acids are NOT DETECTED. The SARS-CoV-2 RNA is generally detectable in upper and lower  respiratory specimens during the acute phase of infection. The lowest  concentration of SARS-CoV-2 viral copies this assay can detect is 250  copies / mL. A negative result does not preclude SARS-CoV-2 infection  and should not be used as the sole basis for treatment or other  patient management decisions.  A negative result may occur with  improper specimen collection / handling, submission of specimen other  than nasopharyngeal swab, presence of viral mutation(s) within the  areas targeted by this assay, and inadequate number of viral copies  (<250 copies / mL). A negative result must be combined with clinical  observations, patient history, and epidemiological information. If result is POSITIVE SARS-CoV-2 target nucleic acids are DETECTED. The SARS-CoV-2 RNA is generally detectable in  upper and lower  respiratory specimens dur ing the acute phase of infection.  Positive  results are indicative of active infection with SARS-CoV-2.  Clinical  correlation with patient history and other diagnostic information is  necessary to determine patient infection status.  Positive results do  not rule out bacterial infection or co-infection with other viruses. If result is PRESUMPTIVE POSTIVE SARS-CoV-2 nucleic acids MAY BE PRESENT.   A presumptive positive result was obtained on the submitted specimen  and confirmed on repeat testing.  While 2019 novel coronavirus  (SARS-CoV-2) nucleic acids may be present in the submitted sample  additional confirmatory testing may be necessary for epidemiological  and / or clinical management purposes  to differentiate between  SARS-CoV-2 and other Sarbecovirus currently known to infect humans.  If clinically  indicated additional testing with an alternate test  methodology [phone]) is advised. The SARS-CoV-2 RNA is generally  detectable in upper and lower respiratory sp ecimens during the acute  phase of infection. The expected result is Negative. Fact Sheet for Patients:  StrictlyIdeas.no Fact Sheet for Healthcare Providers: BankingDealers.co.za This test is not yet approved or cleared by the [place] FDA and has been authorized for detection and/or diagnosis of SARS-CoV-2 by FDA under an Emergency Use Authorization (EUA).  This EUA will remain in effect (meaning this test can be used) for the duration of the COVID-19 declaration under Section 564(b)(1) of the Act, 21 U.S.C. section 360bbb-3(b)(1), unless the authorization is terminated or revoked sooner. Performed at [facility], [place] [address] [place] [address] [place], [place] [address]   Type and screen [place]     Status: None   Collection Time: 08/21/18  4:02 PM  Result Value Ref Range   ABO/RH(D) O POS    Antibody Screen NEG    Sample Expiration      08/24/2018,2359 Performed at [facility], [place] [address] [place],  [address]   I-stat chem 8, ED (not at [facility] or [facility])     Status: Abnormal   Collection Time: 08/21/18  4:44 PM  Result Value Ref Range   Sodium 137 135 - 145 mmol/L   Potassium 4.4 3.5 - 5.1 mmol/L   Chloride 104 98 - 111 mmol/L   BUN 13 8 - 23 mg/dL   Creatinine, Ser 0.70 0.61 - 1.24 mg/dL   Glucose, Bld 130 (H) 70 - 99 mg/dL   Calcium, Ion 1.11 (L) 1.15 - 1.40 mmol/L   TCO2 25 22 - 32 mmol/L   Hemoglobin 15.0 13.0 - 17.0 g/dL   HCT 44.0 39.0 - 52.0 %  CBC WITH DIFFERENTIAL     Status: Abnormal   Collection Time: 08/21/18  9:04 PM  Result Value Ref Range   WBC 12.8 (H) 4.0 - 10.5 K/uL   RBC 4.24 4.22 - 5.81 MIL/uL   Hemoglobin 11.9 (L) 13.0 - 17.0 g/dL    Comment: REPEATED TO VERIFY   HCT 38.7 (L) 39.0 - 52.0 %   MCV 91.3 80.0  - 100.0 fL   MCH 28.1 26.0 - 34.0 pg   MCHC 30.7 30.0 - 36.0 g/dL   RDW 21.9 (H) 11.5 - 15.5 %   Platelets 343 150 - 400 K/uL   nRBC 0.0 0.0 - 0.2 %   Neutrophils Relative % 66 %   Neutro Abs 8.4 (H) 1.7 - 7.7 K/uL   Lymphocytes Relative 21 %   Lymphs Abs 2.7 0.7 - 4.0 K/uL   Monocytes Relative 8 %  Monocytes Absolute 1.1 (H) 0.1 - 1.0 K/uL   Eosinophils Relative 4 %   Eosinophils Absolute 0.5 0.0 - 0.5 K/uL   Basophils Relative 1 %   Basophils Absolute 0.1 0.0 - 0.1 K/uL   Immature Granulocytes 0 %   Abs Immature Granulocytes 0.05 0.00 - 0.07 K/uL   Polychromasia PRESENT     Comment: Performed at [facility] [address], [place], [place] [address]  Glucose, capillary     Status: Abnormal   Collection Time: 08/21/18  9:55 PM  Result Value Ref Range   Glucose-Capillary 183 (H) 70 - 99 mg/dL  MRSA PCR Screening     Status: Abnormal   Collection Time: 08/22/18 12:33 AM   Specimen: Nasal Mucosa; Nasopharyngeal  Result Value Ref Range   MRSA by PCR POSITIVE (A) NEGATIVE    Comment: CRITICAL RESULT CALLED TO, READ BACK BY AND VERIFIED WITH: RN, C. DAVIS [phone] @[phone]  THANEY Performed at [facility] [address] [place], [place] [address]   CBC WITH DIFFERENTIAL     Status: Abnormal   Collection Time: 08/22/18  2:56 AM  Result Value Ref Range   WBC 11.1 (H) 4.0 - 10.5 K/uL   RBC 3.62 (L) 4.22 - 5.81 MIL/uL   Hemoglobin 10.1 (L) 13.0 - 17.0 g/dL   HCT 32.4 (L) 39.0 - 52.0 %   MCV 89.5 80.0 - 100.0 fL   MCH 27.9 26.0 - 34.0 pg   MCHC 31.2 30.0 - 36.0 g/dL   RDW 21.4 (H) 11.5 - 15.5 %   Platelets 302 150 - 400 K/uL   nRBC 0.0 0.0 - 0.2 %   Neutrophils Relative % 53 %   Neutro Abs 6.0 1.7 - 7.7 K/uL   Lymphocytes Relative 27 %   Lymphs Abs 3.0 0.7 - 4.0 K/uL   Monocytes Relative 11 %   Monocytes Absolute 1.2 (H) 0.1 - 1.0 K/uL   Eosinophils Relative 7 %   Eosinophils Absolute 0.8 (H) 0.0 - 0.5 K/uL   Basophils Relative 1 %   Basophils Absolute 0.1  0.0 - 0.1 K/uL   Immature Granulocytes 1 %   Abs Immature Granulocytes 0.05 0.00 - 0.07 K/uL   Target Cells PRESENT    Ovalocytes PRESENT     Comment: Performed at [facility], [address], [place], [place] [address]  Glucose, capillary     Status: None   Collection Time: 08/22/18  7:04 AM  Result Value Ref Range   Glucose-Capillary 89 70 - 99 mg/dL  CBC WITH DIFFERENTIAL     Status: Abnormal   Collection Time: 08/22/18  9:20 AM  Result Value Ref Range   WBC 9.9 4.0 - 10.5 K/uL   RBC 3.46 (L) 4.22 - 5.81 MIL/uL   Hemoglobin 9.9 (L) 13.0 - 17.0 g/dL   HCT 31.6 (L) 39.0 - 52.0 %   MCV 91.3 80.0 - 100.0 fL   MCH 28.6 26.0 - 34.0 pg   MCHC 31.3 30.0 - 36.0 g/dL   RDW 21.8 (H) 11.5 - 15.5 %   Platelets 308 150 - 400 K/uL   nRBC 0.0 0.0 - 0.2 %   Neutrophils Relative % 65 %   Neutro Abs 6.4 1.7 - 7.7 K/uL   Lymphocytes Relative 14 %   Lymphs Abs 1.4 0.7 - 4.0 K/uL   Monocytes Relative 7 %   Monocytes Absolute 0.7 0.1 - 1.0 K/uL   Eosinophils Relative 13 %   Eosinophils  Absolute 1.3 (H) 0.0 - 0.5 K/uL   Basophils Relative 1 %   Basophils Absolute 0.1 0.0 - 0.1 K/uL   nRBC 0 0 /100 WBC   Abs Immature Granulocytes 0.00 0.00 - 0.07 K/uL   Acanthocytes PRESENT     Comment: Performed at [facility], [place] [address], [place], [place] [address]  Comprehensive metabolic panel     Status: Abnormal   Collection Time: 08/22/18  9:20 AM  Result Value Ref Range   Sodium 136 135 - 145 mmol/L   Potassium 3.8 3.5 - 5.1 mmol/L   Chloride 104 98 - 111 mmol/L   CO2 24 22 - 32 mmol/L   Glucose, Bld 161 (H) 70 - 99 mg/dL   BUN 14 8 - 23 mg/dL   Creatinine, Ser 0.82 0.61 - 1.24 mg/dL   Calcium 8.0 (L) 8.9 - 10.3 mg/dL   Total Protein 4.8 (L) 6.5 - 8.1 g/dL   Albumin 2.7 (L) 3.5 - 5.0 g/dL   AST 16 15 - 41 U/L   ALT 10 0 - 44 U/L   Alkaline Phosphatase 48 38 - 126 U/L   Total Bilirubin 0.6 0.3 - 1.2 mg/dL   GFR calc non Af Amer >60 >60 mL/min   GFR calc Af Amer >60 >60 mL/min    Anion gap 8 5 - 15    Comment: Performed at [facility] [address] [place], [place] [address]   Dg Chest 1 View  Result Date: 08/21/2018 CLINICAL DATA:  Syncope EXAM: CHEST  1 VIEW COMPARISON:  April 24, 2018 FINDINGS: The heart size and mediastinal contours are stable. There is no focal infiltrate, pulmonary edema, or pleural effusion. The visualized skeletal structures are unremarkable. IMPRESSION: No active cardiopulmonary disease. Electronically Signed   By: Abelardo Diesel M.D.   On: 08/21/2018 17:15   Ct Angio Abd/pel W/ And/or W/o  Result Date: 08/21/2018 CLINICAL DATA:  Rectal bleeding.  Concern for GI bleed. EXAM: CTA ABDOMEN AND PELVIS wITHOUT AND WITH CONTRAST TECHNIQUE: Multidetector CT imaging of the abdomen and pelvis was performed using the standard protocol during bolus administration of intravenous contrast. Multiplanar reconstructed images and MIPs were obtained and reviewed to evaluate the vascular anatomy. CONTRAST:  137mL OMNIPAQUE IOHEXOL 350 MG/ML SOLN COMPARISON:  Nuclear medicine GI bleeding study of 04/22/2018. 02/04/2018 abdominopelvic CT. FINDINGS: VASCULAR Aorta: Atherosclerosis within.  No aneurysm. Celiac: Widely patent SMA: Atherosclerosis within. Renals: Atherosclerotic irregularity of the origin of the right renal. Left renal is widely patent. IMA: Patent. Inflow: Atherosclerosis without significant stenosis. Right common iliac artery ectasia at 1.6 cm on 131/13. Veins: Not well evaluated. Review of the MIP images confirms the above findings. NON-VASCULAR Lower chest: Right middle lobe scarring. Normal heart size without pericardial or pleural effusion. Small to moderate hiatal hernia with surgical changes at the proximal stomach. Hepatobiliary: Extensive artifact from EKG wires and leads, arm position. Normal liver. Normal gallbladder, without biliary ductal dilatation. Pancreas: Pancreatic atrophy is likely within normal variation for age. Spleen: Splenectomy.  Adrenals/Urinary Tract: Normal adrenal glands. Hyperattenuation in the renal collecting systems prior to contrast is likely due to inadvertent contrast administration. Bilateral too small to characterize renal lesions. Interpolar left renal 1.6 cm cyst or minimally complex cyst. Normal urinary bladder. Stomach/Bowel: Normal distal stomach. Extensive colonic diverticulosis. Mild pericolonic edema is suspected, including on image 103/13. Normal terminal ileum. No sites of active extravasation identified. Normal small bowel. Lymphatic: No abdominopelvic adenopathy. Reproductive: Normal prostate. Other: No significant  free fluid. No free intraperitoneal air. Extensive surgical changes about the anterior upper abdominal wall. Musculoskeletal: Right hip osteoarthritis.  Lumbosacral spondylosis. IMPRESSION: VASCULAR 1. Atherosclerosis, without acute finding. 2. Right common iliac artery ectasia. NON-VASCULAR 1. Descending colonic diverticulosis with suspicion of mild diverticulitis. 2. No evidence of active extravasation to localize GI bleeding. 3. Decreased sensitivity and specificity exam due to technique related factors, as described above. 4. Hiatal hernia. Electronically Signed   By: Abigail Miyamoto M.D.   On: 08/21/2018 18:45    Anti-infectives (From admission, onward)   Start     Dose/Rate Route Frequency Ordered Stop   08/21/18 2100  ciprofloxacin (CIPRO) IVPB 400 mg     400 mg 200 mL/hr over 60 Minutes Intravenous Every 12 hours 08/21/18 2037     08/21/18 2100  metroNIDAZOLE (FLAGYL) IVPB 500 mg     500 mg 100 mL/hr over 60 Minutes Intravenous Every 8 hours 08/21/18 2037         Assessment/Plan Asthma HTN DM GERD H/o CVA on plavix (last dose 7/4) Code status DNR  Recurrent lower GI bleed, suspected diverticular bleeding - Patient with recurrent lower GI bleed on Plavix. His last colonoscopy was in 2018 and showed many small and large-mouthed diverticula in the left colon. This is his suspected  source of GI bleed.  He reports having a total of about 6-7 episodes requiring hospitalization in the last 10 years. This is his third episode in 7 months. Currently stable and bleeding has stopped. We discussed laparoscopic assisted left hemicolectomy including risks and benefits. Due to his age, h/o CVA and multiple medical problems he is higher risk of perioperative stroke or cardiac event, as well as slow postoperative healing. He would have to hold plavix for several days around surgery which would also increase his risk of stroke.  Patient does not want to consider surgery at this time. Mr. Mcgovern may follow up with one of our colorectal surgeons (Dr. Dema Severin, Dr. Marcello Moores, or Dr. Johney Maine) as needed. General surgery will sign off, please call with any concerns.  ID - cipro/flagyl 7/5>> VTE - SCDs FEN - IVF, CLD Foley - none Follow up - none  Wellington Hampshire, [facility] 08/22/2018, 10:32 AM Pager: [phone] Mon-Thurs 7:00 am-4:30 pm Fri 7:00 am -11:30 AM Sat-Sun 7:00 am-11:30 am

## 2018-08-22 NOTE — Progress Notes (Signed)
[facility] Progress Note    Since last GI note: Last overt bleeding was 8-10 hours ago. He feels well this morning.  I last saw him for OV several months ago. Since then he was hospitalized in March, 2020 for bleeding. Again admitted with bleeding now.  Nuc med Bleeding scan in March was non-diagnostic and CT angiogram last night showed no active bleeding.  Objective: Vital signs in last 24 hours: Temp:  [97.7 F (36.5 C)-98.3 F (36.8 C)] 97.7 F (36.5 C) (07/06 0446) Pulse Rate:  [78-109] 78 (07/06 0446) Resp:  [17-26] 17 (07/06 0446) BP: (108-153)/(77-89) 118/78 (07/06 0446) SpO2:  [94 %-99 %] 94 % (07/06 0446) Weight:  [71.7 kg-71.8 kg] 71.8 kg (07/05 2039) Last BM Date: 08/21/18 General: alert and oriented times 3 Heart: regular rate and rythm Abdomen: soft, non-tender, non-distended, normal bowel sounds   Lab Results: Recent Labs    08/21/18 1550 08/21/18 1644 08/21/18 2104 08/22/18 0256  WBC 20.6*  --  12.8* 11.1*  HGB 13.4 15.0 11.9* 10.1*  PLT 388  --  343 302  MCV 92.3  --  91.3 89.5   Recent Labs    08/21/18 1550 08/21/18 1644  NA 137 137  K 4.5 4.4  CL 106 104  CO2 23  --   GLUCOSE 135* 130*  BUN 13 13  CREATININE 0.79 0.70  CALCIUM 8.5*  --    Recent Labs    08/21/18 1550  PROT 6.0*  ALBUMIN 3.4*  AST 17  ALT 11  ALKPHOS 62  BILITOT 0.8   Recent Labs    08/21/18 1550  INR 1.0    Studies/Results: Dg Chest 1 View  Result Date: 08/21/2018 CLINICAL DATA:  Syncope EXAM: CHEST  1 VIEW COMPARISON:  April 24, 2018 FINDINGS: The heart size and mediastinal contours are stable. There is no focal infiltrate, pulmonary edema, or pleural effusion. The visualized skeletal structures are unremarkable. IMPRESSION: No active cardiopulmonary disease. Electronically Signed   By: Abelardo Diesel M.D.   On: 08/21/2018 17:15   Ct Angio Abd/pel W/ And/or W/o  Result Date: 08/21/2018 CLINICAL DATA:  Rectal bleeding.  Concern for GI bleed. EXAM:  CTA ABDOMEN AND PELVIS wITHOUT AND WITH CONTRAST TECHNIQUE: Multidetector CT imaging of the abdomen and pelvis was performed using the standard protocol during bolus administration of intravenous contrast. Multiplanar reconstructed images and MIPs were obtained and reviewed to evaluate the vascular anatomy. CONTRAST:  118mL OMNIPAQUE IOHEXOL 350 MG/ML SOLN COMPARISON:  Nuclear medicine GI bleeding study of 04/22/2018. 02/04/2018 abdominopelvic CT. FINDINGS: VASCULAR Aorta: Atherosclerosis within.  No aneurysm. Celiac: Widely patent SMA: Atherosclerosis within. Renals: Atherosclerotic irregularity of the origin of the right renal. Left renal is widely patent. IMA: Patent. Inflow: Atherosclerosis without significant stenosis. Right common iliac artery ectasia at 1.6 cm on 131/13. Veins: Not well evaluated. Review of the MIP images confirms the above findings. NON-VASCULAR Lower chest: Right middle lobe scarring. Normal heart size without pericardial or pleural effusion. Small to moderate hiatal hernia with surgical changes at the proximal stomach. Hepatobiliary: Extensive artifact from EKG wires and leads, arm position. Normal liver. Normal gallbladder, without biliary ductal dilatation. Pancreas: Pancreatic atrophy is likely within normal variation for age. Spleen: Splenectomy. Adrenals/Urinary Tract: Normal adrenal glands. Hyperattenuation in the renal collecting systems prior to contrast is likely due to inadvertent contrast administration. Bilateral too small to characterize renal lesions. Interpolar left renal 1.6 cm cyst or minimally complex cyst. Normal urinary bladder. Stomach/Bowel: Normal distal stomach. Extensive colonic  diverticulosis. Mild pericolonic edema is suspected, including on image 103/13. Normal terminal ileum. No sites of active extravasation identified. Normal small bowel. Lymphatic: No abdominopelvic adenopathy. Reproductive: Normal prostate. Other: No significant free fluid. No free  intraperitoneal air. Extensive surgical changes about the anterior upper abdominal wall. Musculoskeletal: Right hip osteoarthritis.  Lumbosacral spondylosis. IMPRESSION: VASCULAR 1. Atherosclerosis, without acute finding. 2. Right common iliac artery ectasia. NON-VASCULAR 1. Descending colonic diverticulosis with suspicion of mild diverticulitis. 2. No evidence of active extravasation to localize GI bleeding. 3. Decreased sensitivity and specificity exam due to technique related factors, as described above. 4. Hiatal hernia. Electronically Signed   By: Abigail Miyamoto M.D.   On: 08/21/2018 18:45     Medications: Scheduled Meds: . Chlorhexidine Gluconate Cloth  6 each Topical Q0600  . insulin aspart  0-5 Units Subcutaneous QHS  . insulin aspart  0-9 Units Subcutaneous TID WC  . mupirocin ointment  1 application Nasal BID  . [START ON 08/25/2018] pantoprazole  40 mg Intravenous Q12H   Continuous Infusions: . ciprofloxacin 400 mg (08/21/18 2255)  . lactated ringers 75 mL/hr at 08/21/18 2253  . metronidazole 500 mg (08/22/18 0612)  . pantoprozole (PROTONIX) infusion 8 mg/hr (08/22/18 0236)   PRN Meds:.ondansetron **OR** ondansetron (ZOFRAN) IV    Assessment/Plan: 85 y.o. male with recurrent lower GI bleeding, presumed to be from colonic diverticulosis  THis is his third episode in 7 months and the sixth or seventh episode altogether. He cannot come off plavix due to very high risk of another stroke.  I presume the bleeding is from colonic diverticulosis; shown in left colon on colonoscopy 2018, descending colon on last night's CT.  The bleeding seems to have stopped. His last plavix was on Saturday.  WE discussed two options. 1) Observing for rebleeding and d/c home if none by later this afternoon or AM tomorrow or 2) Considering elective left hemicolectomy since this is proving to continue to happen.  He would like to at least hear what an elective colectomy would entail (risk, recovery etc). We  will contact [facility] to consult.  NOt sure if he actually has acute diverticulitis as well, but certainly a 5 day course of cipro/flagyl is reasonable.  Should continue to hold plavix for now. Continue clears for now as well.  No need for PPI infusion and I will stop it.      Milus Banister, MD  08/22/2018, 7:27 AM [facility] Pager [phone]

## 2018-08-22 NOTE — Progress Notes (Signed)
All IV medications given off schedule due to several IV meds with one working IV, due to losing 2, awaiting another IV.   Eleanora Neighbor, RN

## 2018-08-22 NOTE — Care Management CC44 (Signed)
Condition Code 44 Documentation Completed  Patient Details  Name: [name] MRN: [id number] Date of Birth: Feb 04, 1932   Condition Code 44 given:  Yes Patient signature on Condition Code 44 notice:  Yes Documentation of 2 MD's agreement:  Yes Code 44 added to claim:  Yes    Bartholomew Crews, RN 08/22/2018, 1:01 PM

## 2018-08-22 NOTE — Discharge Summary (Signed)
Physician Discharge Summary  [name] GQQ:[id number] DOB: [birth year] DOA: 08/21/2018  PCP: Seward Carol, MD  Admit date: 08/21/2018 Discharge date: 08/22/2018  Admitted From: Home  Disposition:  Home   Recommendations for Outpatient Follow-up and new medication changes:  1. Follow up with Dr. Delfina Redwood in 7 days.  2. Will continue to hold clopidogrel for next 5 days. 3. Continue antibiotic therapy with Augmentin for 10 days.   Home Health: no   Equipment/Devices: no    Discharge Condition: stable  CODE STATUS: dnr   Diet recommendation:  Heart healthy and heart healthy.  Brief/Interim Summary: 85 year old male who presented with rectal bleeding.  He does have significant past medical history for diverticulosis with recurrent bleeding, history of CVA, asthma, hypertension, type 2 diabetes mellitus, GERD and osteoarthritis.  He had multiple episodes of rectal bleeding in the past, this time he had at least 5 episodes of bright red blood per rectum.  He felt very weak, apparently had a vagal episode with brief loss of consciousness. On his initial physical examination, (positive orthostatic: Systolic blood pressure 671, 135, 118, heart rate 97, 107, 118).  Blood pressure 130/85, heart rate 109, respiratory rate 19, oxygen saturation 97%.  He had moist mucous membranes, his lungs were clear to auscultation bilaterally, heart S1 S2 present and rhythmic, abdomen with left lower quadrant tenderness, no lower extremity edema.  Sodium 137, potassium 4.5, chloride 106, bicarb 23, glucose 135, BUN 13, creatinine 0.79, white count 20.6, hemoglobin 13.4, hematocrit 44.6, platelets 388, SARS COVID-19 negative.  CT Angio of the abdomen pelvis with atherosclerosis no acute findings, descending colon with diverticulosis with mild diverticulitis.  Chest radiograph with hyperinflation, left atelectasis.  Patient was admitted to the hospital with a  working diagnosis of lower GI bleed in the setting of acute  diverticulitis.  1.  Acute diverticulitis complicated by lower GI bleed/complicated by syncope.  Patient was admitted to the medical ward, he received intravenous fluids with isotonic saline and IV antibiotic therapy with ciprofloxacin and metronidazole.  No further bleeding, his hemoglobin and hematocrit at discharge is 9.9/31.6.  His diet was advanced progressively with good toleration, no further abdominal pain, his discharge white cell count is 9.9.  Patient continue taking antibiotic therapy with Augmentin for the next 10 days.  2.  Diverticulosis with recurrent diverticular bleed.  Patient was seen by surgery for possible elective left hemi-colectomy, at this point he declined further surgery, will follow-up as an outpatient.  3.  Paroxysmal atrial fibrillation.  patient remain on sinus rhythm, will hold clopidogrel for next 5 days. Continue carvedilol.   4.  History of congestive heart failure.  Diastolic dysfunction.  Left ventricle ejection fraction 45%.  Clinically stable with no signs of exacerbation he tolerated well IV fluids. Continue low dose carvedilol.   5.  Type 2 diabetes mellitus.  Patient was placed on insulin sliding scale for glucose coverage and monitor, his capillary glucose remained well controlled. Resume metformin at discharge.   6. Dyslipidemia with hx of CVA. Continue simvastatin and resume clopidogrel in 5 days.   Discharge Diagnoses:  Principal Problem:   Rectal bleed Active Problems:   History of esophageal strciture   Diverticulosis of colon with hemorrhage   Diabetes mellitus type II, non insulin dependent (HCC)   Seizures (HCC)   HLD (hyperlipidemia)   History of CVA (cerebrovascular accident)   Benign essential HTN   PAF (paroxysmal atrial fibrillation) (HCC)   Syncope   GI bleed   Chronic combined  systolic and diastolic CHF (congestive heart failure) ([place])    Discharge Instructions   Allergies as of 08/22/2018      Reactions   Tape Other (See  Comments)   SKIN IS SENSITIVE; PLEASE USE PAPER TAPE; SKIN BRUISES AND TEARS EASILY!!   Azithromycin Rash, Other (See Comments)   Pt had a ZPak Jan 2020 - no reaction      Medication List    TAKE these medications   amoxicillin-clavulanate 875-125 MG tablet Commonly known as: AUGMENTIN Take 1 tablet by mouth every 8 (eight) hours for 10 days.   carvedilol 3.125 MG tablet Commonly known as: COREG Take 1 tablet (3.125 mg total) by mouth 2 (two) times daily with a meal.   clopidogrel 75 MG tablet Commonly known as: PLAVIX Take 75 mg by mouth daily.   diphenhydramine-acetaminophen 25-500 MG Tabs tablet Commonly known as: TYLENOL PM Take 2 tablets by mouth at bedtime as needed (sleep).   ferrous sulfate 325 (65 FE) MG tablet Take 1 tablet (325 mg total) by mouth 2 (two) times daily with a meal.   fluticasone 50 MCG/ACT nasal spray Commonly known as: FLONASE Place 1 spray into both nostrils every morning.   guaiFENesin 600 MG 12 hr tablet Commonly known as: MUCINEX Take 600-1,200 mg by mouth daily as needed for cough or to loosen phlegm.   Melatonin 5 MG Tabs Take 5 mg by mouth at bedtime.   metFORMIN 1000 MG tablet Commonly known as: GLUCOPHAGE Take 1,000 mg by mouth daily with breakfast.   mometasone-formoterol 100-5 MCG/ACT Aero Commonly known as: DULERA Inhale 2 puffs into the lungs 2 (two) times daily. What changed: how much to take   montelukast 10 MG tablet Commonly known as: SINGULAIR Take 10 mg by mouth at bedtime.   polyethylene glycol 17 g packet Commonly known as: MIRALAX / GLYCOLAX Take 17 g by mouth daily.   ProAir HFA 108 (90 Base) MCG/ACT inhaler Generic drug: albuterol Inhale 2 puffs into the lungs every 6 (six) hours as needed for wheezing or shortness of breath.   psyllium 58.6 % powder Commonly known as: METAMUCIL Take 1 packet by mouth every morning.   rOPINIRole 0.5 MG tablet Commonly known as: REQUIP Take 2 mg by mouth at bedtime.    simvastatin 40 MG tablet Commonly known as: ZOCOR Take 1 tablet (40 mg total) by mouth daily. What changed: when to take this   zolpidem 10 MG tablet Commonly known as: AMBIEN Take 5 mg by mouth at bedtime as needed for sleep.      Follow-up [facility], [place]. Call.   Specialty: General Surgery Why: as needed Contact information: [place] [phone]         Allergies  Allergen Reactions  . Tape Other (See Comments)    SKIN IS SENSITIVE; PLEASE USE PAPER TAPE; SKIN BRUISES AND TEARS EASILY!!  . Azithromycin Rash and Other (See Comments)    Pt had a ZPak Jan 2020 - no reaction    Consultations:  GI  Surgery    Procedures/Studies: Dg Chest 1 View  Result Date: 08/21/2018 CLINICAL DATA:  Syncope EXAM: CHEST  1 VIEW COMPARISON:  April 24, 2018 FINDINGS: The heart size and mediastinal contours are stable. There is no focal infiltrate, pulmonary edema, or pleural effusion. The visualized skeletal structures are unremarkable. IMPRESSION: No active cardiopulmonary disease. Electronically Signed   By: Abelardo Diesel M.D.   On:  08/21/2018 17:15   Ct Angio Abd/pel W/ And/or W/o  Result Date: 08/21/2018 CLINICAL DATA:  Rectal bleeding.  Concern for GI bleed. EXAM: CTA ABDOMEN AND PELVIS wITHOUT AND WITH CONTRAST TECHNIQUE: Multidetector CT imaging of the abdomen and pelvis was performed using the standard protocol during bolus administration of intravenous contrast. Multiplanar reconstructed images and MIPs were obtained and reviewed to evaluate the vascular anatomy. CONTRAST:  150mL OMNIPAQUE IOHEXOL 350 MG/ML SOLN COMPARISON:  Nuclear medicine GI bleeding study of 04/22/2018. 02/04/2018 abdominopelvic CT. FINDINGS: VASCULAR Aorta: Atherosclerosis within.  No aneurysm. Celiac: Widely patent SMA: Atherosclerosis within. Renals: Atherosclerotic irregularity of the origin of the right renal. Left renal  is widely patent. IMA: Patent. Inflow: Atherosclerosis without significant stenosis. Right common iliac artery ectasia at 1.6 cm on 131/13. Veins: Not well evaluated. Review of the MIP images confirms the above findings. NON-VASCULAR Lower chest: Right middle lobe scarring. Normal heart size without pericardial or pleural effusion. Small to moderate hiatal hernia with surgical changes at the proximal stomach. Hepatobiliary: Extensive artifact from EKG wires and leads, arm position. Normal liver. Normal gallbladder, without biliary ductal dilatation. Pancreas: Pancreatic atrophy is likely within normal variation for age. Spleen: Splenectomy. Adrenals/Urinary Tract: Normal adrenal glands. Hyperattenuation in the renal collecting systems prior to contrast is likely due to inadvertent contrast administration. Bilateral too small to characterize renal lesions. Interpolar left renal 1.6 cm cyst or minimally complex cyst. Normal urinary bladder. Stomach/Bowel: Normal distal stomach. Extensive colonic diverticulosis. Mild pericolonic edema is suspected, including on image 103/13. Normal terminal ileum. No sites of active extravasation identified. Normal small bowel. Lymphatic: No abdominopelvic adenopathy. Reproductive: Normal prostate. Other: No significant free fluid. No free intraperitoneal air. Extensive surgical changes about the anterior upper abdominal wall. Musculoskeletal: Right hip osteoarthritis.  Lumbosacral spondylosis. IMPRESSION: VASCULAR 1. Atherosclerosis, without acute finding. 2. Right common iliac artery ectasia. NON-VASCULAR 1. Descending colonic diverticulosis with suspicion of mild diverticulitis. 2. No evidence of active extravasation to localize GI bleeding. 3. Decreased sensitivity and specificity exam due to technique related factors, as described above. 4. Hiatal hernia. Electronically Signed   By: Abigail Miyamoto M.D.   On: 08/21/2018 18:45      Procedures:   Subjective: Patient is feeling  back to his normal, no further bleeding or orthostatic symptoms, no nausea or vomiting, no dyspnea, or chest pain, tolerating po well.   Discharge Exam: Vitals:   08/22/18 0446 08/22/18 0854  BP: 118/78 126/71  Pulse: 78 79  Resp: 17 18  Temp: 97.7 F (36.5 C) 98 F (36.7 C)  SpO2: 94% 98%   Vitals:   08/21/18 2000 08/21/18 2039 08/22/18 0446 08/22/18 0854  BP: 124/85 (!) 153/78 118/78 126/71  Pulse: (!) 102 98 78 79  Resp: (!) 25 (!) 21 17 18   Temp:  98.3 F (36.8 C) 97.7 F (36.5 C) 98 F (36.7 C)  TempSrc:  Oral Oral Oral  SpO2: 98% 99% 94% 98%  Weight:  71.8 kg    Height:  5\' 10"  (1.778 m)      General: Not in pain or dyspnea  Neurology: Awake and alert, non focal  E ENT: mild pallor, no icterus, oral mucosa moist Cardiovascular: No JVD. S1-S2 present, rhythmic, no gallops, rubs, or murmurs. No lower extremity edema. Pulmonary: positive breath sounds bilaterally, adequate air movement, no wheezing, rhonchi or rales. Gastrointestinal. Abdomen with no organomegaly, non tender, no rebound or guarding Skin. No rashes Musculoskeletal: no joint deformities   The results of significant diagnostics from  this hospitalization (including imaging, microbiology, ancillary and laboratory) are listed below for reference.     Microbiology: Recent Results (from the past 240 hour(s))  SARS Coronavirus 2 (CEPHEID - Performed in [place] hospital lab), Hosp Order     Status: None   Collection Time: 08/21/18  3:51 PM   Specimen: Nasopharyngeal Swab  Result Value Ref Range Status   SARS Coronavirus 2 NEGATIVE NEGATIVE Final    Comment: (NOTE) If result is NEGATIVE SARS-CoV-2 target nucleic acids are NOT DETECTED. The SARS-CoV-2 RNA is generally detectable in upper and lower  respiratory specimens during the acute phase of infection. The lowest  concentration of SARS-CoV-2 viral copies this assay can detect is 250  copies / mL. A negative result does not preclude SARS-CoV-2  infection  and should not be used as the sole basis for treatment or other  patient management decisions.  A negative result may occur with  improper specimen collection / handling, submission of specimen other  than nasopharyngeal swab, presence of viral mutation(s) within the  areas targeted by this assay, and inadequate number of viral copies  (<250 copies / mL). A negative result must be combined with clinical  observations, patient history, and epidemiological information. If result is POSITIVE SARS-CoV-2 target nucleic acids are DETECTED. The SARS-CoV-2 RNA is generally detectable in upper and lower  respiratory specimens dur ing the acute phase of infection.  Positive  results are indicative of active infection with SARS-CoV-2.  Clinical  correlation with patient history and other diagnostic information is  necessary to determine patient infection status.  Positive results do  not rule out bacterial infection or co-infection with other viruses. If result is PRESUMPTIVE POSTIVE SARS-CoV-2 nucleic acids MAY BE PRESENT.   A presumptive positive result was obtained on the submitted specimen  and confirmed on repeat testing.  While 2019 novel coronavirus  (SARS-CoV-2) nucleic acids may be present in the submitted sample  additional confirmatory testing may be necessary for epidemiological  and / or clinical management purposes  to differentiate between  SARS-CoV-2 and other Sarbecovirus currently known to infect humans.  If clinically indicated additional testing with an alternate test  methodology [phone]) is advised. The SARS-CoV-2 RNA is generally  detectable in upper and lower respiratory sp ecimens during the acute  phase of infection. The expected result is Negative. Fact Sheet for Patients:  StrictlyIdeas.no Fact Sheet for Healthcare Providers: BankingDealers.co.za This test is not yet approved or cleared by the [place]  FDA and has been authorized for detection and/or diagnosis of SARS-CoV-2 by FDA under an Emergency Use Authorization (EUA).  This EUA will remain in effect (meaning this test can be used) for the duration of the COVID-19 declaration under Section 564(b)(1) of the Act, 21 U.S.C. section 360bbb-3(b)(1), unless the authorization is terminated or revoked sooner. Performed at [facility], [place] [address] [place], [place] [address]   MRSA PCR Screening     Status: Abnormal   Collection Time: 08/22/18 12:33 AM   Specimen: Nasal Mucosa; Nasopharyngeal  Result Value Ref Range Status   MRSA by PCR POSITIVE (A) NEGATIVE Final    Comment: CRITICAL RESULT CALLED TO, READ BACK BY AND VERIFIED WITH: RN, C. DAVIS [id number] @0329  THANEY Performed at [facility] [address], [place], [place] [address]      Labs: BNP (last 3 results) No results for input(s): BNP in the last 8760 hours. Basic Metabolic Panel: Recent Labs  Lab 08/21/18 1550 08/21/18 1644  08/22/18 0920  NA 137 137 136  K 4.5 4.4 3.8  CL 106 104 104  CO2 23  --  24  GLUCOSE 135* 130* 161*  BUN 13 13 14   CREATININE 0.79 0.70 0.82  CALCIUM 8.5*  --  8.0*   Liver Function Tests: Recent Labs  Lab 08/21/18 1550 08/22/18 0920  AST 17 16  ALT 11 10  ALKPHOS 62 48  BILITOT 0.8 0.6  PROT 6.0* 4.8*  ALBUMIN 3.4* 2.7*   No results for input(s): LIPASE, AMYLASE in the last 168 hours. No results for input(s): AMMONIA in the last 168 hours. CBC: Recent Labs  Lab 08/21/18 1550 08/21/18 1644 08/21/18 2104 08/22/18 0256 08/22/18 0920  WBC 20.6*  --  12.8* 11.1* 9.9  NEUTROABS 15.1*  --  8.4* 6.0 6.4  HGB 13.4 15.0 11.9* 10.1* 9.9*  HCT 44.6 44.0 38.7* 32.4* 31.6*  MCV 92.3  --  91.3 89.5 91.3  PLT 388  --  343 302 308   Cardiac Enzymes: No results for input(s): CKTOTAL, CKMB, CKMBINDEX, TROPONINI in the last 168 hours. BNP: Invalid input(s): POCBNP CBG: Recent Labs  Lab 08/21/18 1539 08/21/18 2155  08/22/18 0704 08/22/18 1139  GLUCAP 141* 183* 89 109*   D-Dimer No results for input(s): DDIMER in the last 72 hours. Hgb A1c No results for input(s): HGBA1C in the last 72 hours. Lipid Profile No results for input(s): CHOL, HDL, LDLCALC, TRIG, CHOLHDL, LDLDIRECT in the last 72 hours. Thyroid function studies No results for input(s): TSH, T4TOTAL, T3FREE, THYROIDAB in the last 72 hours.  Invalid input(s): FREET3 Anemia work up No results for input(s): VITAMINB12, FOLATE, FERRITIN, TIBC, IRON, RETICCTPCT in the last 72 hours. Urinalysis    Component Value Date/Time   COLORURINE STRAW (A) 04/21/2018 2320   APPEARANCEUR CLEAR 04/21/2018 2320   LABSPEC 1.013 04/21/2018 2320   PHURINE 6.0 04/21/2018 2320   GLUCOSEU NEGATIVE 04/21/2018 2320   HGBUR NEGATIVE 04/21/2018 2320   BILIRUBINUR NEGATIVE 04/21/2018 2320   KETONESUR NEGATIVE 04/21/2018 2320   PROTEINUR NEGATIVE 04/21/2018 2320   UROBILINOGEN 0.2 12/22/2013 1210   NITRITE NEGATIVE 04/21/2018 2320   LEUKOCYTESUR NEGATIVE 04/21/2018 2320   Sepsis Labs Invalid input(s): PROCALCITONIN,  WBC,  LACTICIDVEN Microbiology Recent Results (from the past 240 hour(s))  SARS Coronavirus 2 (CEPHEID - Performed in [facility] hospital lab), Hosp Order     Status: None   Collection Time: 08/21/18  3:51 PM   Specimen: Nasopharyngeal Swab  Result Value Ref Range Status   SARS Coronavirus 2 NEGATIVE NEGATIVE Final    Comment: (NOTE) If result is NEGATIVE SARS-CoV-2 target nucleic acids are NOT DETECTED. The SARS-CoV-2 RNA is generally detectable in upper and lower  respiratory specimens during the acute phase of infection. The lowest  concentration of SARS-CoV-2 viral copies this assay can detect is 250  copies / mL. A negative result does not preclude SARS-CoV-2 infection  and should not be used as the sole basis for treatment or other  patient management decisions.  A negative result may occur with  improper specimen collection /  handling, submission of specimen other  than nasopharyngeal swab, presence of viral mutation(s) within the  areas targeted by this assay, and inadequate number of viral copies  (<250 copies / mL). A negative result must be combined with clinical  observations, patient history, and epidemiological information. If result is POSITIVE SARS-CoV-2 target nucleic acids are DETECTED. The SARS-CoV-2 RNA is generally detectable in upper and lower  respiratory specimens  dur ing the acute phase of infection.  Positive  results are indicative of active infection with SARS-CoV-2.  Clinical  correlation with patient history and other diagnostic information is  necessary to determine patient infection status.  Positive results do  not rule out bacterial infection or co-infection with other viruses. If result is PRESUMPTIVE POSTIVE SARS-CoV-2 nucleic acids MAY BE PRESENT.   A presumptive positive result was obtained on the submitted specimen  and confirmed on repeat testing.  While 2019 novel coronavirus  (SARS-CoV-2) nucleic acids may be present in the submitted sample  additional confirmatory testing may be necessary for epidemiological  and / or clinical management purposes  to differentiate between  SARS-CoV-2 and other Sarbecovirus currently known to infect humans.  If clinically indicated additional testing with an alternate test  methodology [phone]) is advised. The SARS-CoV-2 RNA is generally  detectable in upper and lower respiratory sp ecimens during the acute  phase of infection. The expected result is Negative. Fact Sheet for Patients:  StrictlyIdeas.no Fact Sheet for Healthcare Providers: BankingDealers.co.za This test is not yet approved or cleared by the [place] FDA and has been authorized for detection and/or diagnosis of SARS-CoV-2 by FDA under an Emergency Use Authorization (EUA).  This EUA will remain in effect (meaning this  test can be used) for the duration of the COVID-19 declaration under Section 564(b)(1) of the Act, 21 U.S.C. section 360bbb-3(b)(1), unless the authorization is terminated or revoked sooner. Performed at [facility], [place] [address] [place], [place] [address]   MRSA PCR Screening     Status: Abnormal   Collection Time: 08/22/18 12:33 AM   Specimen: Nasal Mucosa; Nasopharyngeal  Result Value Ref Range Status   MRSA by PCR POSITIVE (A) NEGATIVE Final    Comment: CRITICAL RESULT CALLED TO, READ BACK BY AND VERIFIED WITH: RN, C. DAVIS [phone] @0329  THANEY Performed at [facility] [address], [place], [place] [address]      Time coordinating discharge: 45 minutes  SIGNED:   Tawni Millers, MD  [facility] 08/22/2018, 12:36 PM

## 2018-08-29 DIAGNOSIS — R69 Illness, unspecified: Secondary | ICD-10-CM | POA: Diagnosis not present

## 2018-09-01 DIAGNOSIS — R69 Illness, unspecified: Secondary | ICD-10-CM | POA: Diagnosis not present

## 2018-09-01 DIAGNOSIS — J45991 Cough variant asthma: Secondary | ICD-10-CM | POA: Diagnosis not present

## 2018-09-01 DIAGNOSIS — R1319 Other dysphagia: Secondary | ICD-10-CM | POA: Diagnosis not present

## 2018-09-01 DIAGNOSIS — J324 Chronic pansinusitis: Secondary | ICD-10-CM | POA: Diagnosis not present

## 2018-09-06 DIAGNOSIS — K5731 Diverticulosis of large intestine without perforation or abscess with bleeding: Secondary | ICD-10-CM | POA: Diagnosis not present

## 2018-09-06 IMAGING — CT CT HEAD W/O CM
3 series · 15 of 47 positions shown, 18 images · non-contrast
Comparison: 02/22/2016

CLINICAL DATA: Confusion, speech difficulty this morning

EXAM:
CT HEAD WITHOUT CONTRAST
TECHNIQUE: Contiguous axial images were obtained from the base of the skull
through the vertex without intravenous contrast.

[Series 3: head 5.0 h30s · axial · 0.44mm/px · z∈[+1090,+1225]mm · 9 of 33 slices shown, 12 images]
[im 3/33  brain]
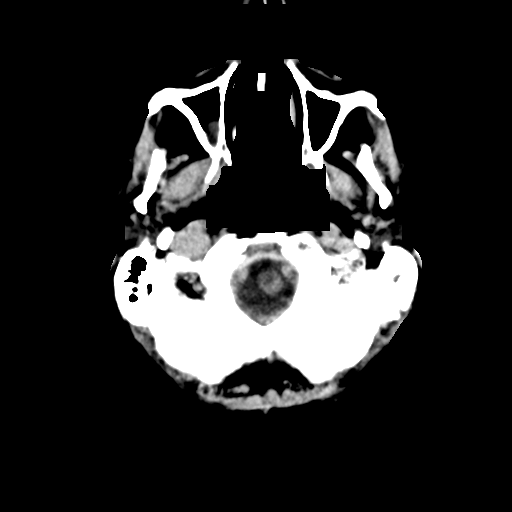
[im 3/33  bone]
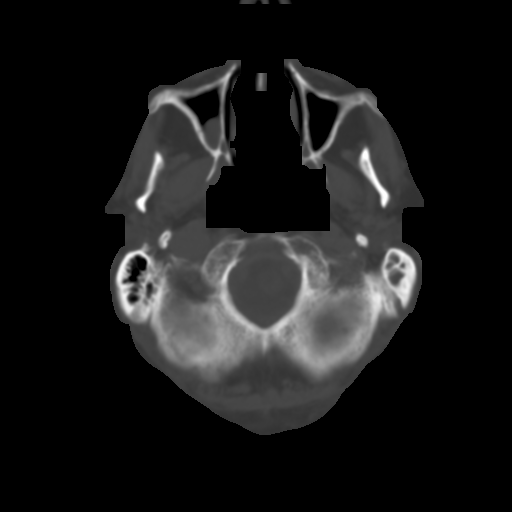
[im 6/33  brain]
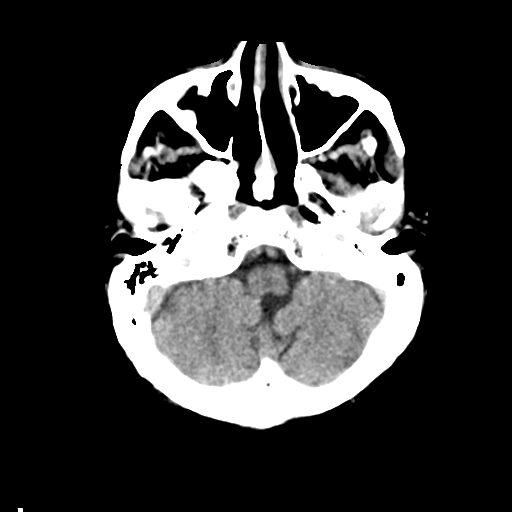
[im 9/33  brain]
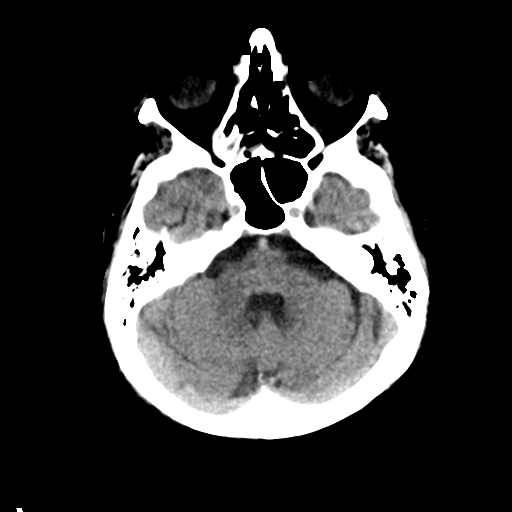
[im 13/33  brain]
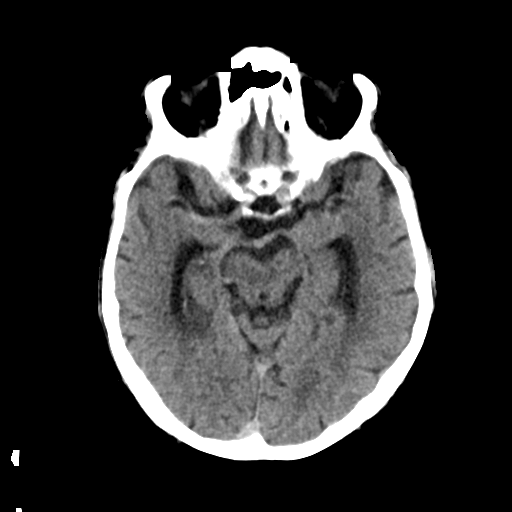
[im 17/33  brain]
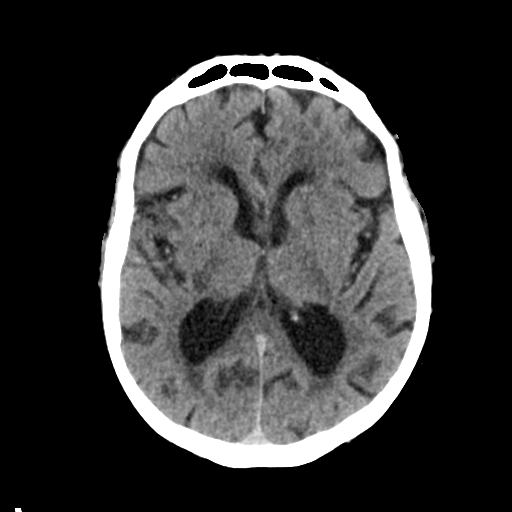
[im 17/33  bone]
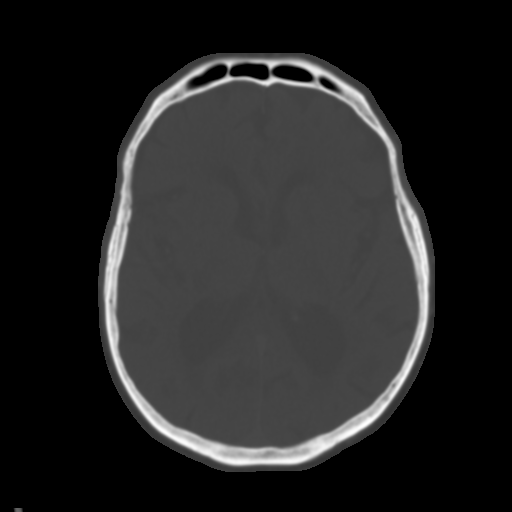
[im 20/33  brain]
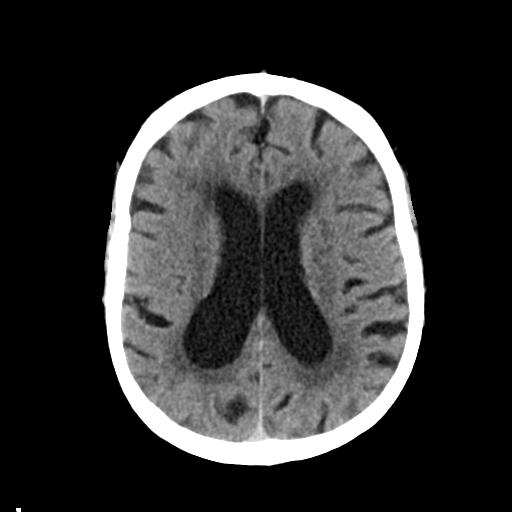
[im 24/33  brain]
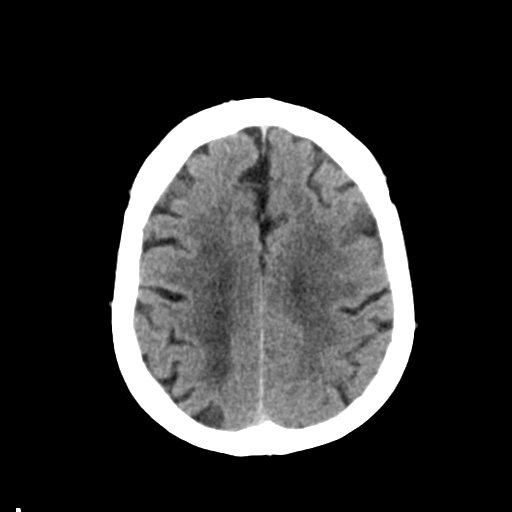
[im 27/33  brain]
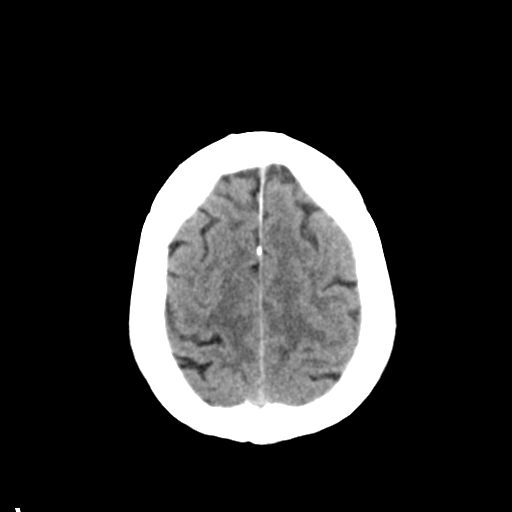
[im 30/33  brain]
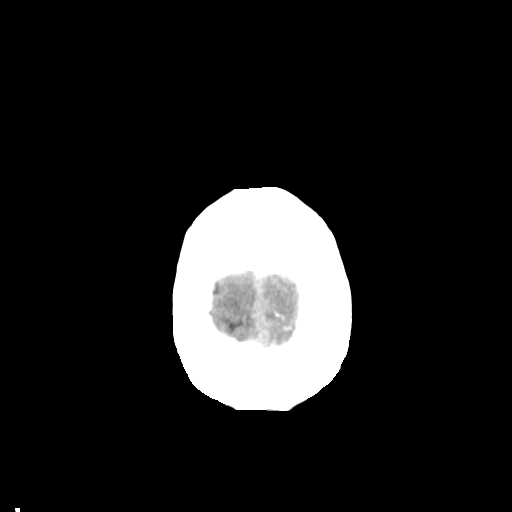
[im 30/33  bone]
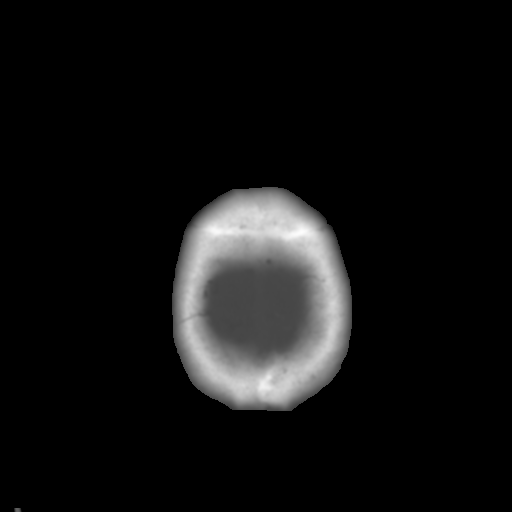

[Series 5: head 3.0 mpr cor · coronal · 0.34mm/px · 3 of 60 slices shown]
[im 20/60  brain]
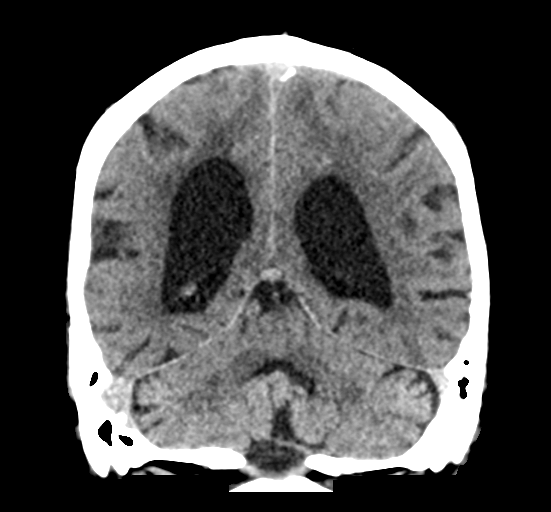
[im 27/60  brain]
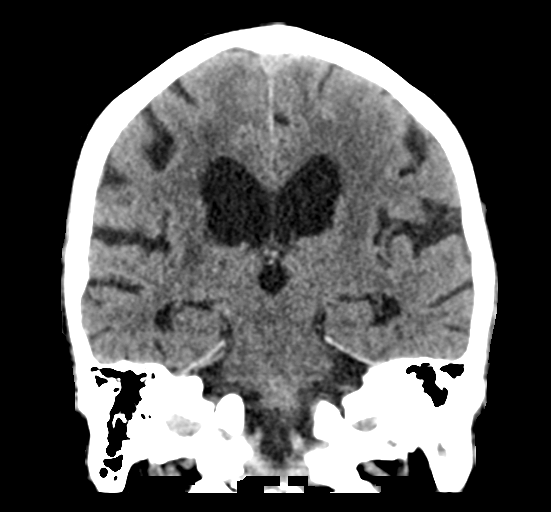
[im 33/60  brain]
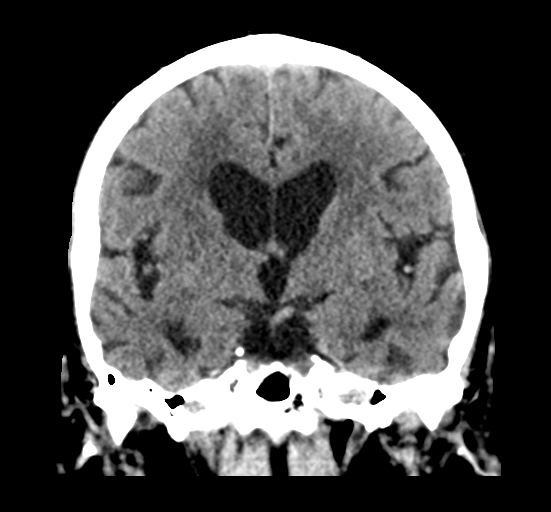

[Series 6: head 3.0 mpr sag · sagittal · 0.33mm/px · 3 of 51 slices shown]
[im 17/51  brain]
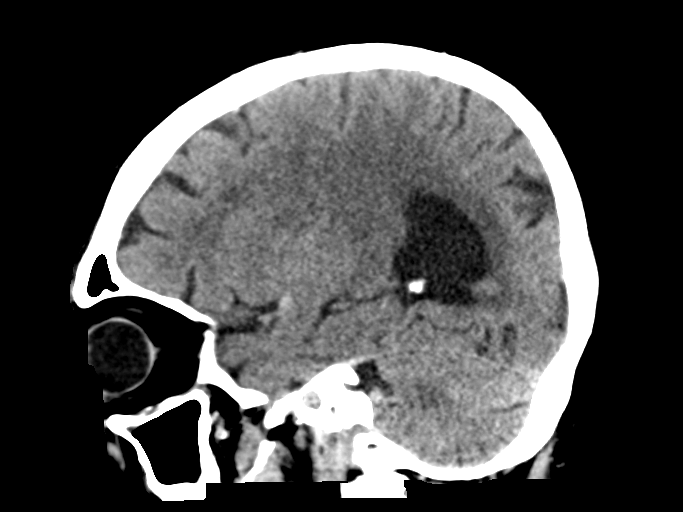
[im 26/51  brain]
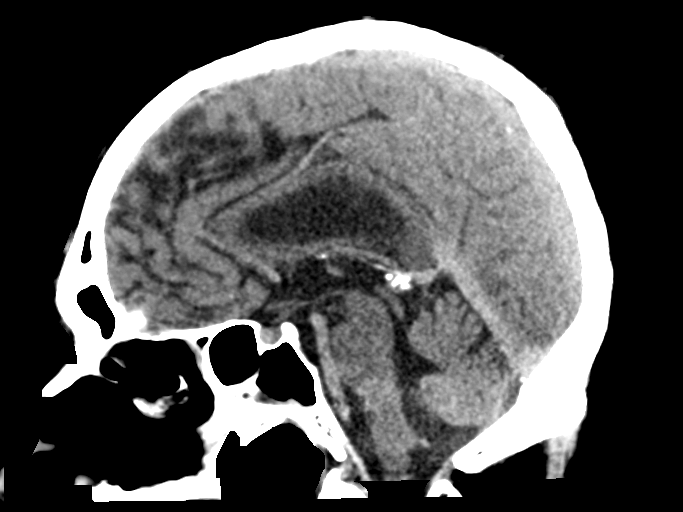
[im 34/51  brain]
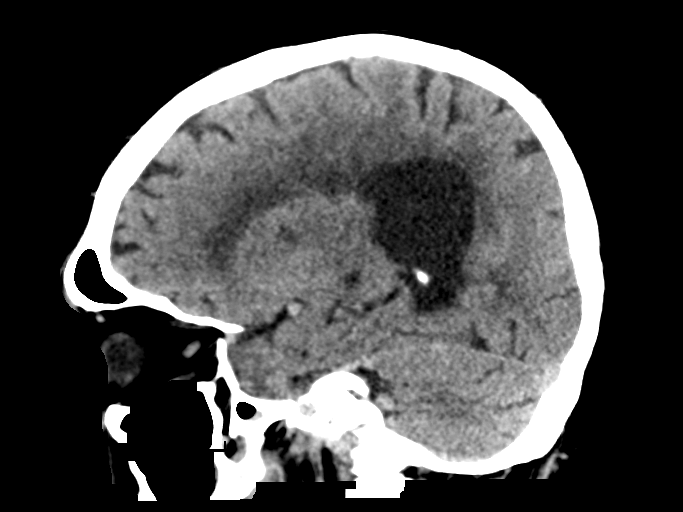

[15 of 47 positions shown; findings below may reference images not displayed]

FINDINGS: Brain: No intracranial hemorrhage, mass effect or midline shift.
Stable cerebral atrophy. Stable periventricular and patchy
subcortical chronic white matter disease. No definite acute cortical
infarction. Stable small lacunar infarct in right external capsule.
Tiny lacunar infarct in right posterior limb of internal capsule.
Tiny lacunar infarct in left caudate nucleus. No mass lesion is
noted on this unenhanced scan.

Vascular: Atherosclerotic calcifications of carotid siphon again
noted.

Skull: No skull fracture is noted.

Sinuses/Orbits: There is mild mucosal thickening and probable mucous
retention cyst in right maxillary sinus. The mucous retention cyst
axial image 3 right posterior aspect right maxillary sinus measures
1 cm. There is mucosal thickening with partial opacification
bilateral ethmoid air cells.

Other: None
IMPRESSION: No acute intracranial abnormality.Stable cerebral atrophy. Stable
periventricular and patchy subcortical chronic white matter disease.
No definite acute cortical infarction. Stable small lacunar infarct
in right external capsule. Tiny lacunar infarct in right posterior
limb of internal capsule. Tiny lacunar infarct in left caudate
nucleus. Paranasal sinuses disease right maxillary sinus and
bilateral ethmoid air cells.

## 2018-09-07 DIAGNOSIS — K5731 Diverticulosis of large intestine without perforation or abscess with bleeding: Secondary | ICD-10-CM | POA: Diagnosis not present

## 2018-09-21 ENCOUNTER — Ambulatory Visit: Payer: Medicare HMO | Admitting: Nurse Practitioner

## 2018-09-21 ENCOUNTER — Telehealth: Payer: Self-pay

## 2018-09-21 ENCOUNTER — Encounter: Payer: Self-pay | Admitting: Nurse Practitioner

## 2018-09-21 VITALS — BP 148/74 | HR 76 | Temp 97.7°F | Ht 70.0 in | Wt 165.0 lb

## 2018-09-21 DIAGNOSIS — Z7901 Long term (current) use of anticoagulants: Secondary | ICD-10-CM

## 2018-09-21 DIAGNOSIS — R1310 Dysphagia, unspecified: Secondary | ICD-10-CM

## 2018-09-21 DIAGNOSIS — R05 Cough: Secondary | ICD-10-CM | POA: Diagnosis not present

## 2018-09-21 DIAGNOSIS — R059 Cough, unspecified: Secondary | ICD-10-CM

## 2018-09-21 NOTE — Patient Instructions (Signed)
If you are age 83 or older, your body mass index should be between 23-30. Your Body mass index is 23.68 kg/m. If this is out of the aforementioned range listed, please consider follow up with your Primary Care Provider.  If you are age 54 or younger, your body mass index should be between 19-25. Your Body mass index is 23.68 kg/m. If this is out of the aformentioned range listed, please consider follow up with your Primary Care Provider.   You have been scheduled for an endoscopy. Please follow written instructions given to you at your visit today. If you use inhalers (even only as needed), please bring them with you on the day of your procedure. Your physician has requested that you go to www.startemmi.com and enter the access code given to you at your visit today. This web site gives a general overview about your procedure. However, you should still follow specific instructions given to you by our office regarding your preparation for the procedure.  You will be contacted by our office prior to your procedure for directions on holding your Plavix.  If you do not hear from our office 1 week prior to your scheduled procedure, please call [phone] to discuss.   Thank you for choosing me and [facility].   Tye Savoy, NP

## 2018-09-21 NOTE — Progress Notes (Signed)
Chief Complaint:    Swallowing problems  IMPRESSION and PLAN:     41.  85 year old male with worsening intermittent dysphagia over the last few weeks.  He is status post remote Niesen fundoplication.  Last EGD with balloon dilation of an esophageal stenosis was in 2009.  -Patient will be scheduled with EGD for possible dilation off plavix.  Given patient's history of esophageal stenosis I assume Dr. Ardis Hughs will be okay proceeding with the procedure.  Otherwise we can first obtain a barium swallow with tablet.   2.  History recurrent GI bleeding on plavix, assumed diverticular hemorrhage.  He was just hospitalized for this in early July.  Bleeding presumed to be left-sided though bleeding scans /  CTAs have been unrevealing.  He isn't interested in hemi-colectomy at this point. surgery evaluated, patient opted against hemicolectomy.  -As a side note, he had a colonoscopy October 2018 during an admission for bleeding.  Bowel prep was fair/poor but there were no plans to repeat colonoscopy given his age  HPI:     Patient is an 85 year old male with multiple medical problems not limited to diabetes, combined CHF, and CVA on chronic plavix.  Patient is well-known to Dr. Ardis Hughs, he has been admitted several times for recurrent GI bleeding, presumed diverticular. Bleeding scans and CT angiograms have been unrevealing.  Most recent admission for GI bleeding was early July of this year .  His hemoglobin dropped from 13.4 to 9.9. CTA unable to localize source of bleeding but did raise concern for mild diverticulitis.  Patient was treated with IV antibiotics and sent home with Augmentin  Evaluated by [facility] for hemicolectomy given recurrent bleeds and patient's inability to be stop Plavix.  He did not want to proceed with surgical intervention.  [name] is here with his wife for evaluation of dysphagia.  He has been having intermittent dysphagia for months but over the last 3 to 4 weeks it is gotten  worse.  He starts off eating okay but then after several bites begins to feel like food is getting stuck or "building up" in esophagus. Sometimes this requires patient taking his finger down his throat to get lodged food out of the esophagus. Other times he can just relax and with time the lodge piece (s) of food seem to pass.   Patient has a very remote history of Nissen fundoplication. In 2009 we evaluated him for intermittent dysphagia.  Upper GI series showed an intact wrap, lower esophageal smooth narrowing.  He subsequently underwent EGD with findings of stenosis about 1 cm above a small hiatal hernia.  Stenosis was balloon dilated up to 18 mm.    Review of systems:     No chest pain, no SOB, no fevers, no urinary sx   Past Medical History:  Diagnosis Date  . Acute CVA (cerebrovascular accident) ([place]) 07/25/2015  . Asthma   . Benign essential HTN   . Cerebral infarction due to embolism of left middle cerebral artery ([place]) 02/03/2014  . Chronic combined systolic and diastolic CHF (congestive heart failure) ([place]) 12/23/2017  . Colitis   . Colon polyps   . Diabetes mellitus type 2, diet-controlled ([place]) 12/22/2013  . Diverticulosis   . GERD (gastroesophageal reflux disease)   . GI bleed   . Hemorrhoids   . Hiatal hernia   . History of esophageal strciture   . HLD (hyperlipidemia)   . Osteoarthritis   . Proctitis   . Status post dilation of  esophageal narrowing   . Stroke [facility])     Patient's surgical history, family medical history, social history, medications and allergies were all reviewed in Epic   Creatinine clearance cannot be calculated (Patient's most recent lab result is older than the maximum 21 days allowed.)  Current Outpatient Medications  Medication Sig Dispense Refill  . albuterol (PROAIR HFA) 108 (90 Base) MCG/ACT inhaler Inhale 2 puffs into the lungs every 6 (six) hours as needed for wheezing or shortness of breath.    . carvedilol (COREG) 3.125 MG tablet Take 1  tablet (3.125 mg total) by mouth 2 (two) times daily with a meal. 60 tablet 1  . clopidogrel (PLAVIX) 75 MG tablet Take 75 mg by mouth daily.    . diphenhydramine-acetaminophen (TYLENOL PM) 25-500 MG TABS tablet Take 2 tablets by mouth at bedtime as needed (sleep).    . ferrous sulfate 325 (65 FE) MG tablet Take 1 tablet (325 mg total) by mouth 2 (two) times daily with a meal. 60 tablet 0  . fluticasone (FLONASE) 50 MCG/ACT nasal spray Place 1 spray into both nostrils every morning.   5  . guaiFENesin (MUCINEX) 600 MG 12 hr tablet Take 600-1,200 mg by mouth daily as needed for cough or to loosen phlegm.     . Melatonin 5 MG TABS Take 5 mg by mouth at bedtime.    . metFORMIN (GLUCOPHAGE) 1000 MG tablet Take 1,000 mg by mouth daily with breakfast.    . mometasone-formoterol (DULERA) 100-5 MCG/ACT AERO Inhale 2 puffs into the lungs 2 (two) times daily. (Patient taking differently: Inhale 1 puff into the lungs 2 (two) times daily. ) 1 Inhaler 0  . montelukast (SINGULAIR) 10 MG tablet Take 10 mg by mouth at bedtime.    . polyethylene glycol (MIRALAX / GLYCOLAX) packet Take 17 g by mouth daily.    . psyllium (METAMUCIL) 58.6 % powder Take 1 packet by mouth every morning.     Marland Kitchen rOPINIRole (REQUIP) 0.5 MG tablet Take 2 mg by mouth at bedtime.     . simvastatin (ZOCOR) 40 MG tablet Take 1 tablet (40 mg total) by mouth daily. (Patient taking differently: Take 40 mg by mouth at bedtime. ) 30 tablet 3  . zolpidem (AMBIEN) 10 MG tablet Take 5 mg by mouth at bedtime as needed for sleep.     No current facility-administered medications for this visit.     Physical Exam:     There were no vitals taken for this visit.  GENERAL:  Pleasant male in NAD PSYCH: : Cooperative, normal affect EENT:  conjunctiva pink, mucous membranes moist, neck supple without masses CARDIAC:  RRR, no peripheral edema PULM: Normal respiratory effort, lungs CTA bilaterally, no wheezing ABDOMEN:  Nondistended, soft, nontender.  No obvious masses, no hepatomegaly,  normal bowel sounds SKIN:  turgor, no lesions seen Musculoskeletal:  Normal muscle tone, normal strength NEURO: Alert and oriented x 3, no focal neurologic deficits   Tye Savoy , NP 09/21/2018, 8:47 AM

## 2018-09-21 NOTE — Telephone Encounter (Signed)
  [facility] [address] [place], [place]  [address] Phone:  [phone]   Fax:  [phone]   09/21/2018   RE:      [name] DOB:   Dec 27, 1931 MRN:   [id number]   Dear Dr. Delfina Redwood,    We have scheduled the above patient for an endoscopic procedure. Our records show that he is on anticoagulation therapy.   Please advise as to whether the patient may come off his therapy of Plavix for five days prior to the EGD procedure, which is scheduled for 10/07/18.  Please fax back the completed form to [place], [place] at [phone].   Sincerely,    Thurmon Fair, RMA

## 2018-09-22 NOTE — Progress Notes (Signed)
[name], I agree with EGD first.  Thanks

## 2018-09-26 DIAGNOSIS — R69 Illness, unspecified: Secondary | ICD-10-CM | POA: Diagnosis not present

## 2018-09-28 ENCOUNTER — Encounter: Payer: Self-pay | Admitting: Gastroenterology

## 2018-09-28 ENCOUNTER — Other Ambulatory Visit: Payer: Self-pay

## 2018-09-28 ENCOUNTER — Ambulatory Visit (INDEPENDENT_AMBULATORY_CARE_PROVIDER_SITE_OTHER): Payer: Medicare HMO | Admitting: Neurology

## 2018-09-28 ENCOUNTER — Encounter: Payer: Self-pay | Admitting: Neurology

## 2018-09-28 VITALS — BP 121/69 | HR 62 | Temp 99.3°F | Wt 159.0 lb

## 2018-09-28 DIAGNOSIS — G459 Transient cerebral ischemic attack, unspecified: Secondary | ICD-10-CM

## 2018-09-28 MED ORDER — COENZYME Q10 30 MG PO CAPS: 200.0000 mg | ORAL_CAPSULE | Freq: Every day | ORAL | 1 refills | Status: DC

## 2018-09-28 NOTE — Patient Instructions (Signed)
I had a long d/w patient and his wife about his remote strokes, recent TIA versus seizure episode in March 2020 and gait difficulty, risk for recurrent stroke/TIAs, personally independently reviewed imaging studies and stroke evaluation results and answered questions.Continue Plavixl 75 mg daily  for secondary stroke prevention and maintain strict control of hypertension with blood pressure goal below 130/90, diabetes with hemoglobin A1c goal below 6.5% and lipids with LDL cholesterol goal below 70 mg/dL. I also advised the patient to eat a healthy diet with plenty of whole grains, cereals, fruits and vegetables, exercise regularly and maintain ideal body weight he was advised to use his wheeled walker at all times for ambulation and we discussed fall and safety precautions.Patient is neurologically cleared to undergo esophageal dilatation and may stop Plavix 3-5 days prior to the procedure with a small but acceptable periprocedural risk of TIA/stroke if he is willing.  Plavix to be resumed after the procedure when safe. Check EEG study. .Followup in the future with my nurse practitioner Janett Billow in 6 months  or call earlier if necessary

## 2018-09-28 NOTE — Progress Notes (Signed)
[facility]  PATIENT: [name] DOB: August 26, 1931   REASON FOR VISIT: Follow-up for possible TIA last weekend and slurred speech right hand numbness for 30 minutes HISTORY FROM:    HISTORY OF PRESENT ILLNESS:UPDATE 07/22/2016.CM [name], 85 year old male returns for follow-up with possible TIA event over the weekend where he had slurred speech and right hand numbness for 30 minutes. He is currently on aspirin 325 mg daily. He is on Zocor for hyperlipidemia and is due to have labs next week his primary care. Blood pressure in the office today 149/78 he is continuing to get physical therapy 2 times a week for left lower extremity weakness since admission for stroke and 02/22/2016 . Carotid Doppler at that time 1-39% bilateral ICA stenosis negative MRA of the head MRI with right lateral thalamus and right posterior limb internal capsule infarct. He had another admission to the ER on 05/04/2016 for aphasia/ TIA. MRI without acute infarct. Marland Kitchen He lives in independent living at [facility]. He returns for reevaluation   HISTORY 07/2015 [name] had acute onset of dizziness on 07/25/15 and was admitted to [facility]. MRI showed tiny acute infarct in the superior left occipital lobe. CTA head and neck unremarkable. EF 45-50%, A1C 6.5, LDL not checked. His investigational meds were discontinued. He was put on plavix. Further embolic work up deferred as he would like to be discharged the 2nd day.   Discussed with pt and his wife that he had recurrent episodes of word finding difficulty. MRI on 12/22/13 showed acute infarct at left temporal cortical region. He was enrolled to RESPECT ESUS trial to compare ASA vs. pradaxa in 04/2014. However, he had another TIA episode with word finding difficulty and MRI negative. He was managed to be remained in the trial. This time his symptoms are different from previous, but MRI showed acute tiny infarct at left occipital lobe. He had 30 day cardiac monitoring  in the past with [name] was told negative for afib. During to recurrent episodes and embolic pattern, we can either put him on coumadin and INR 2-3 or continue plavix and check TEE and consider loop recorder. Pros and cons of either approach have been discussed with pt and wife. Pt more leaning towards coumadin and wife leaning toward the other. They would like to further discuss with PCP [name] and let me know. I will forward the note to [name] today and they will call him next week.   Update 11/25/2016 :  He returns for follow-up after last visit 4 months ago. He is accompanied by his wife. His abnormal further recurrent TIA or stroke symptoms. He had to stop Plavix for a week as he had some rectal bleed which was felt to be diverticular bleed. He has since resumed Plavix and is doing all right without further bleeding problems. He continues to walk with a walker due to stiffness and weakness in left leg as well as left knee pain which is bothersome. He recently got intra-articular injection by Dr.: Into his left knee. Uses a wheeled walker regularly. He states is careful and has not had any recent falls. He is complaining of mild short-term memory difficulties as well as double finding words and completing sentences. He does not participate in any mentally challenging activities. Update the 06/09/2017 :he returns for follow-up after last for this 6 months ago. He is accompanied by his wife. He has not had any recurrent stroke or TIA symptoms. He continues to have  left leg weakness and difficulty with gait and balance. He does use a wheeled walker all the time. He has not had any falls or injuries recently. He states his memory difficulties unchanged and are not progressive. His remains on Plavix which is tolerating well with only minor bruising but no bleeding. States his blood pressure is under good control today it is 124/72. He is tolerating Zocor well and last lipid profile checked for  satisfactory by his primary physician. He was diagnosed with pneumonia 2 weeks ago by his primary physician and given a course of antibiotics and is now improving. Update 09/28/2018 : He returns for follow-up after last visit with me in April 2019.  He is accompanied by his wife.  He had an episode of possible TIA versus seizure in March 2020.  Patient had been admitted for diverticular bleed to the hospital and had been off Plavix for 5 days.  After the patient was discharged home in the same evening the wife noticed that he had speech difficulties and was unable to speak or verbalize he had was able unable to respond to the wife and a blank look on his face.  She called 911 patient went back to the hospital this episode of speech difficulty lasted 10 to 20 minutes.  Work-up in the emergency room included a CT scan without contrast which did not show acute abnormality.  CT angiogram of the head and neck was obtained which did not show any major stenosis.  MRI scan of the brain was also obtained which showed old basal ganglia infarct without any acute infarct and changes of small vessel disease and generalized atrophy.  Patient has done well since discharge has had no further recurrent episodes of speech disturbance, TIA or seizures.  Is tolerating Plavix with only minor bruising.  His blood pressures well controlled today 121/62.  His last hemoglobin A1c was 6.6 checked 2 months ago.  He remains on Zocor which is tolerating well without myalgias but does have some knee and foot pain.  Continues to have gait and balance difficulties.  He does use a wheeled walker and states he is careful and has not had any major falls or injuries.  He is having dysphagia and plans to have elective esophageal dilatation to be done by [name] on 10/07/2018 and wants neurological clearance and to hold Plavix for the procedure. REVIEW OF SYSTEMS: Full 14 system review of systems performed and notable only for those listed, all  others are neg:   Speech difficulty, memory loss, unresponsiveness, staring gait difficulty and all other systems negative  ALLERGIES: Allergies  Allergen Reactions  . Tape Other (See Comments)    SKIN IS SENSITIVE; PLEASE USE PAPER TAPE; SKIN BRUISES AND TEARS EASILY!!  . Azithromycin Rash and Other (See Comments)    Pt had a ZPak Jan 2020 - no reaction    HOME MEDICATIONS: Outpatient Medications Prior to Visit  Medication Sig Dispense Refill  . albuterol (PROAIR HFA) 108 (90 Base) MCG/ACT inhaler Inhale 2 puffs into the lungs every 6 (six) hours as needed for wheezing or shortness of breath.    . budesonide (PULMICORT) 0.5 MG/2ML nebulizer solution Take 2 mLs by nebulization 2 (two) times daily.    . carvedilol (COREG) 3.125 MG tablet Take 1 tablet (3.125 mg total) by mouth 2 (two) times daily with a meal. 60 tablet 1  . clopidogrel (PLAVIX) 75 MG tablet Take 75 mg by mouth daily.    . diphenhydramine-acetaminophen (TYLENOL PM)  25-500 MG TABS tablet Take 2 tablets by mouth at bedtime as needed (sleep).    . ferrous sulfate 325 (65 FE) MG tablet Take 1 tablet (325 mg total) by mouth 2 (two) times daily with a meal. 60 tablet 0  . fluticasone (FLONASE) 50 MCG/ACT nasal spray Place 1 spray into both nostrils every morning.   5  . guaiFENesin (MUCINEX) 600 MG 12 hr tablet Take 600-1,200 mg by mouth daily as needed for cough or to loosen phlegm.     . Melatonin 10 MG TABS Take 1 tablet by mouth at bedtime.    . metFORMIN (GLUCOPHAGE) 1000 MG tablet Take 1,000 mg by mouth daily with breakfast.    . montelukast (SINGULAIR) 10 MG tablet Take 10 mg by mouth at bedtime.    Marland Kitchen NOVOLOG 100 UNIT/ML injection     . polyethylene glycol (MIRALAX / GLYCOLAX) packet Take 17 g by mouth daily.    . psyllium (METAMUCIL) 58.6 % powder Take 1 packet by mouth every morning.     Marland Kitchen rOPINIRole (REQUIP) 0.5 MG tablet Take 2 mg by mouth at bedtime.     Marland Kitchen rOPINIRole (REQUIP) 1 MG tablet     . simvastatin (ZOCOR)  40 MG tablet Take 1 tablet (40 mg total) by mouth daily. (Patient taking differently: Take 40 mg by mouth at bedtime. ) 30 tablet 3  . zolpidem (AMBIEN) 10 MG tablet Take 5 mg by mouth at bedtime as needed for sleep.     No facility-administered medications prior to visit.     PAST MEDICAL HISTORY: Past Medical History:  Diagnosis Date  . Acute CVA (cerebrovascular accident) ([place]) 07/25/2015  . Asthma   . Benign essential HTN   . Cerebral infarction due to embolism of left middle cerebral artery ([place]) 02/03/2014  . Chronic combined systolic and diastolic CHF (congestive heart failure) ([place]) 12/23/2017  . Colitis   . Colon polyps   . Diabetes mellitus type 2, diet-controlled ([place]) 12/22/2013  . Diverticulosis   . GERD (gastroesophageal reflux disease)   . GI bleed   . Hemorrhoids   . Hiatal hernia   . History of esophageal strciture   . HLD (hyperlipidemia)   . Osteoarthritis   . Proctitis   . Status post dilation of esophageal narrowing   . Stroke [facility])     PAST SURGICAL HISTORY: Past Surgical History:  Procedure Laterality Date  . ABDOMINAL HERNIA REPAIR    . COLONOSCOPY     multiple  . COLONOSCOPY WITH PROPOFOL N/A 12/12/2016   Procedure: COLONOSCOPY WITH PROPOFOL;  Surgeon: Gatha Mayer, MD;  Location: [facility] ENDOSCOPY;  Service: Endoscopy;  Laterality: N/A;  . ESOPHAGOGASTRODUODENOSCOPY     multiple  . INSERTION OF MESH  01/29/2012   Procedure: INSERTION OF MESH;  Surgeon: Gwenyth Ober, MD;  Location: [place];  Service: General;  Laterality: N/A;  . NISSEN FUNDOPLICATION    . SPLENECTOMY, TOTAL     nontraumatic rupture  . VENTRAL HERNIA REPAIR  01/29/2012    WITH MESH  . VENTRAL HERNIA REPAIR  01/29/2012   Procedure: HERNIA REPAIR VENTRAL ADULT;  Surgeon: Gwenyth Ober, MD;  Location: [place];  Service: General;  Laterality: N/A;  open recurrent ventral hernia repair with mesh    FAMILY HISTORY: Family History  Problem Relation Age of Onset  . Alzheimer's disease  Mother   . Breast cancer Mother   . Throat cancer Father   . Colon cancer Neg Hx   .  Colon polyps Neg Hx   . Pancreatic cancer Neg Hx   . Stomach cancer Neg Hx   . Liver disease Neg Hx     SOCIAL HISTORY: Social History   Socioeconomic History  . Marital status: Married    Spouse name: [name]  . Number of children: 2  . Years of education: Bachelor's  . Highest education level: Not on file  Occupational History  . Occupation: retired  Scientific laboratory technician  . Financial resource strain: Not on file  . Food insecurity    Worry: Not on file    Inability: Not on file  . Transportation needs    Medical: Not on file    Non-medical: Not on file  Tobacco Use  . Smoking status: Never Smoker  . Smokeless tobacco: Never Used  Substance and Sexual Activity  . Alcohol use: Yes    Alcohol/week: 2.0 standard drinks    Types: 2 Glasses of wine per week    Comment: daily  . Drug use: No  . Sexual activity: Not on file  Lifestyle  . Physical activity    Days per week: Not on file    Minutes per session: Not on file  . Stress: Not on file  Relationships  . Social Herbalist on phone: Not on file    Gets together: Not on file    Attends religious service: Not on file    Active member of club or organization: Not on file    Attends meetings of clubs or organizations: Not on file    Relationship status: Not on file  . Intimate partner violence    Fear of current or ex partner: Not on file    Emotionally abused: Not on file    Physically abused: Not on file    Forced sexual activity: Not on file  Other Topics Concern  . Not on file  Social History Narrative   Patient is married and has 2 children.   Patient is right handed.   Patient has Bachelor's degree.   Patient drinks 2 cups daily.     PHYSICAL EXAM  Vitals:   09/28/18 1121  BP: 121/69  Pulse: 62  Temp: 99.3 F (37.4 C)  Weight: 72.1 kg   Body mass index is 22.81 kg/m.  Generalized: frail elderly  Caucasian male, in no acute distress  Head: normocephalic and atraumatic,.   Neck: Supple, no carotid bruits  Cardiac: Regular rate rhythm, no murmur  Musculoskeletal: No deformity . Mild kyphosis  Neurological examination   Mentation: Alert oriented to time, place, history taking. Attention span and concentration appropriate. Recent and remote memory intact.  Follows all commands speech and language fluent. Diminished recall   Cranial nerve II-XII: Fundoscopic exam not done.Pupils were equal round reactive to light extraocular movements were full, visual field were full on confrontational test. Facial sensation and strength were normal. hearing was intact to finger rubbing bilaterally. Uvula tongue midline. head turning and shoulder shrug were normal and symmetric.Tongue protrusion into cheek strength was normal. Motor: normal bulk and tone, full strength in the  UE,  LE, on the right, M.ild weakness in the left upper extremity in the grip and intrinsic hand muscles. Orbits right over left approximately. Fine finger movements are diminished on the left.3-5  left lower extremity with foot drop. AFO and knee brace in place , 4 out of 5 left upper extremity. Mild spasticity left leg with increased tone. Sensory: normal and symmetric to light touch, in  the upper and lower extremities Coordination: finger-nose-finger,  no dysmetria Reflexes: Brisker in the left upper and lower, plantar responses were flexor bilaterally. Gait and Station: Rising up from seated position with push off.uses a wheeled walker and drags left foot. DIAGNOSTIC DATA (LABS, IMAGING, TESTING) - I reviewed patient records, labs, notes, testing and imaging myself where available.  Lab Results  Component Value Date   WBC 9.9 08/22/2018   HGB 9.9 (L) 08/22/2018   HCT 31.6 (L) 08/22/2018   MCV 91.3 08/22/2018   PLT 308 08/22/2018      Component Value Date/Time   NA 136 08/22/2018 0920   K 3.8 08/22/2018 0920   CL 104  08/22/2018 0920   CO2 24 08/22/2018 0920   GLUCOSE 161 (H) 08/22/2018 0920   BUN 14 08/22/2018 0920   CREATININE 0.82 08/22/2018 0920   CREATININE 0.74 02/08/2012 1209   CALCIUM 8.0 (L) 08/22/2018 0920   PROT 4.8 (L) 08/22/2018 0920   ALBUMIN 2.7 (L) 08/22/2018 0920   AST 16 08/22/2018 0920   ALT 10 08/22/2018 0920   ALKPHOS 48 08/22/2018 0920   BILITOT 0.6 08/22/2018 0920   GFRNONAA >60 08/22/2018 0920   GFRAA >60 08/22/2018 0920   Lab Results  Component Value Date   CHOL 111 04/24/2018   HDL 44 04/24/2018   LDLCALC 54 04/24/2018   TRIG 65 04/24/2018   CHOLHDL 2.5 04/24/2018   Lab Results  Component Value Date   HGBA1C 5.9 (H) 04/24/2018     ASSESSMENT AND PLAN  85 y.o. year old male  has a past medical history of Acute CVA (cerebrovascular accident) ([place]) (07/25/2015);  Benign essential HTN; Cerebral infarction due to embolism of left middle cerebral artery ([facility]) (02/03/2014);  HLD (hyperlipidemia); Osteoarthritis;  and Stroke ([place]).TIA in May 2018.He has residual gait and balance difficulties. Mild short-term memory and speech difficulties due to age-appropriate mild cognitive impairment which appears stable recent episode of TIA versus seizure in March 2020.   I had a long d/w patient and his wife about his remote strokes, recent TIA versus seizure episode in March 2020 and gait difficulty, risk for recurrent stroke/TIAs, personally independently reviewed imaging studies and stroke evaluation results and answered questions.Continue Plavixl 75 mg daily  for secondary stroke prevention and maintain strict control of hypertension with blood pressure goal below 130/90, diabetes with hemoglobin A1c goal below 6.5% and lipids with LDL cholesterol goal below 70 mg/dL I advised him to start co-Q10 200 mg daily to help with statin arthralgias.. I also advised the patient to eat a healthy diet with plenty of whole grains, cereals, fruits and vegetables, exercise regularly and maintain  ideal body weight he was advised to use his wheeled walker at all times for ambulation and we discussed fall and safety precautions.Patient is neurologically cleared to undergo esophageal dilatation and may stop Plavix 3-5 days prior to the procedure with a small but acceptable periprocedural risk of TIA/stroke if he is willing.  Plavix to be resumed after the procedure when safe. Check EEG study. .Followup in the future with my nurse practitioner Janett Billow in 6 months  or call earlier if necessary.Greater than 50% time during this 25 minute visit was spent on counseling and coordination of care about his strokes, gait and balance difficulties in answering questions.  Antony Contras, MD [facility] [address] [place], [place] [address] [phone]

## 2018-09-29 ENCOUNTER — Telehealth: Payer: Self-pay

## 2018-09-29 NOTE — Telephone Encounter (Signed)
I spoke with patient today regarding holding Plavix.  He states Dr. Heinz Knuckles him to hold 3-5 days prior to EGD on 10/07/18.  The patient states he feels more comfortable holding for 4 days.  Please advise if this is okay.  Thanks [name], [place]

## 2018-09-30 NOTE — Telephone Encounter (Signed)
I tried to contact patient at 4:45 pm today Friday to let him know that Dr. Ardis Hughs is Off today and that he should cancel procedure if he is not agreeable to holding Plavix five days as preferred.   The phone kept ringing with no answer and no voicemail.  I will call patient again on Monday to let him know.  Peter Congo, [place]

## 2018-09-30 NOTE — Telephone Encounter (Signed)
5 day hold preferred  Thanks

## 2018-09-30 NOTE — Telephone Encounter (Signed)
Dr. Ardis Hughs I called patient to let him know that you prefer he hold his Plavix five days and he is not comfortable with that.  He states he will delay procedure if he has to hold five days.  However, he states if he can hold four days he will go ahead.  Please advise.  Thanks, [name]

## 2018-10-03 NOTE — Telephone Encounter (Signed)
I explained to patient that I tried to reach him on Friday without any success.  I told him that Dr. Ardis Hughs nurse, Chong Sicilian stated that he should cancel at this time if he wasn't agreeable to holding Plavix five days.  Patient stated the last time he did that, he ended up in the hospital.  Patient states he will wait and self evaluate and get back with [place].

## 2018-10-03 NOTE — Telephone Encounter (Signed)
Patient returning phone call. Best # [phone]

## 2018-10-04 DIAGNOSIS — J45991 Cough variant asthma: Secondary | ICD-10-CM | POA: Diagnosis not present

## 2018-10-04 DIAGNOSIS — R05 Cough: Secondary | ICD-10-CM | POA: Diagnosis not present

## 2018-10-04 DIAGNOSIS — J4520 Mild intermittent asthma, uncomplicated: Secondary | ICD-10-CM | POA: Diagnosis not present

## 2018-10-04 DIAGNOSIS — R69 Illness, unspecified: Secondary | ICD-10-CM | POA: Diagnosis not present

## 2018-10-06 NOTE — Telephone Encounter (Signed)
I spoke with the pt and he wants to wait and think about having the EGD a little while before setting up an appt.  He will call back when he is ready to schedule.

## 2018-10-06 NOTE — Telephone Encounter (Signed)
Can you please contact [name].  Explain to him that I have been thinking about his case, issues with TIA and need to hold his plavix for EGD (likely dilation).    Tell him I"m happy to try EGD with dilation with only a 4 day plavix hold as he had been asking.  Let's plan to do it at [facility] rather than [place] just to be safe given the slightly increased bleeding risk.  My next available. Thanks

## 2018-10-07 ENCOUNTER — Encounter: Payer: Medicare HMO | Admitting: Gastroenterology

## 2018-10-19 DIAGNOSIS — R69 Illness, unspecified: Secondary | ICD-10-CM | POA: Diagnosis not present

## 2018-10-21 ENCOUNTER — Other Ambulatory Visit: Payer: Self-pay | Admitting: Internal Medicine

## 2018-10-27 DIAGNOSIS — R69 Illness, unspecified: Secondary | ICD-10-CM | POA: Diagnosis not present

## 2018-10-31 DIAGNOSIS — R69 Illness, unspecified: Secondary | ICD-10-CM | POA: Diagnosis not present

## 2018-11-04 DIAGNOSIS — J309 Allergic rhinitis, unspecified: Secondary | ICD-10-CM | POA: Diagnosis not present

## 2018-11-04 DIAGNOSIS — J45991 Cough variant asthma: Secondary | ICD-10-CM | POA: Diagnosis not present

## 2018-11-09 ENCOUNTER — Other Ambulatory Visit: Payer: Self-pay

## 2018-11-09 ENCOUNTER — Ambulatory Visit: Payer: Medicare HMO | Admitting: Neurology

## 2018-11-09 DIAGNOSIS — R410 Disorientation, unspecified: Secondary | ICD-10-CM

## 2018-11-09 DIAGNOSIS — G459 Transient cerebral ischemic attack, unspecified: Secondary | ICD-10-CM

## 2018-11-14 ENCOUNTER — Telehealth: Payer: Self-pay

## 2018-11-14 DIAGNOSIS — R69 Illness, unspecified: Secondary | ICD-10-CM | POA: Diagnosis not present

## 2018-11-14 NOTE — Telephone Encounter (Signed)
-----   Message from Garvin Fila, MD sent at 11/10/2018  4:32 PM EDT ----- Mitchell Heir inform the patient that EEG study was normal

## 2018-11-14 NOTE — Telephone Encounter (Signed)
I called pt that his EEG was normal. PT verbalized understanding.

## 2018-11-15 DIAGNOSIS — J45991 Cough variant asthma: Secondary | ICD-10-CM | POA: Diagnosis not present

## 2018-11-28 DIAGNOSIS — M170 Bilateral primary osteoarthritis of knee: Secondary | ICD-10-CM | POA: Diagnosis not present

## 2018-11-28 DIAGNOSIS — M25561 Pain in right knee: Secondary | ICD-10-CM | POA: Diagnosis not present

## 2018-11-28 DIAGNOSIS — M25562 Pain in left knee: Secondary | ICD-10-CM | POA: Diagnosis not present

## 2018-11-29 DIAGNOSIS — R69 Illness, unspecified: Secondary | ICD-10-CM | POA: Diagnosis not present

## 2018-12-01 DIAGNOSIS — J45909 Unspecified asthma, uncomplicated: Secondary | ICD-10-CM | POA: Diagnosis not present

## 2018-12-01 DIAGNOSIS — K922 Gastrointestinal hemorrhage, unspecified: Secondary | ICD-10-CM | POA: Diagnosis not present

## 2018-12-01 DIAGNOSIS — G2581 Restless legs syndrome: Secondary | ICD-10-CM | POA: Diagnosis not present

## 2018-12-01 DIAGNOSIS — Z23 Encounter for immunization: Secondary | ICD-10-CM | POA: Diagnosis not present

## 2018-12-01 DIAGNOSIS — I519 Heart disease, unspecified: Secondary | ICD-10-CM | POA: Diagnosis not present

## 2018-12-01 DIAGNOSIS — E1169 Type 2 diabetes mellitus with other specified complication: Secondary | ICD-10-CM | POA: Diagnosis not present

## 2018-12-01 DIAGNOSIS — E7800 Pure hypercholesterolemia, unspecified: Secondary | ICD-10-CM | POA: Diagnosis not present

## 2018-12-09 ENCOUNTER — Telehealth: Payer: Self-pay | Admitting: Gastroenterology

## 2018-12-09 ENCOUNTER — Emergency Department (HOSPITAL_COMMUNITY): Payer: Medicare HMO

## 2018-12-09 ENCOUNTER — Encounter (HOSPITAL_COMMUNITY): Payer: Self-pay | Admitting: Emergency Medicine

## 2018-12-09 ENCOUNTER — Emergency Department (HOSPITAL_COMMUNITY)
Admission: EM | Admit: 2018-12-09 | Discharge: 2018-12-09 | Disposition: A | Discharge status: Home / Self Care | Payer: Medicare HMO | Attending: Emergency Medicine | Admitting: Emergency Medicine

## 2018-12-09 DIAGNOSIS — I5042 Chronic combined systolic (congestive) and diastolic (congestive) heart failure: Secondary | ICD-10-CM | POA: Insufficient documentation

## 2018-12-09 DIAGNOSIS — K625 Hemorrhage of anus and rectum: Secondary | ICD-10-CM | POA: Insufficient documentation

## 2018-12-09 DIAGNOSIS — Z79899 Other long term (current) drug therapy: Secondary | ICD-10-CM | POA: Insufficient documentation

## 2018-12-09 DIAGNOSIS — J45909 Unspecified asthma, uncomplicated: Secondary | ICD-10-CM | POA: Diagnosis not present

## 2018-12-09 DIAGNOSIS — I110 Hypertensive heart disease with heart failure: Secondary | ICD-10-CM | POA: Insufficient documentation

## 2018-12-09 DIAGNOSIS — Z7902 Long term (current) use of antithrombotics/antiplatelets: Secondary | ICD-10-CM | POA: Insufficient documentation

## 2018-12-09 DIAGNOSIS — Z7984 Long term (current) use of oral hypoglycemic drugs: Secondary | ICD-10-CM | POA: Insufficient documentation

## 2018-12-09 DIAGNOSIS — Z8673 Personal history of transient ischemic attack (TIA), and cerebral infarction without residual deficits: Secondary | ICD-10-CM | POA: Diagnosis not present

## 2018-12-09 DIAGNOSIS — R58 Hemorrhage, not elsewhere classified: Secondary | ICD-10-CM | POA: Diagnosis not present

## 2018-12-09 LAB — CBC WITH DIFFERENTIAL/PLATELET
Abs Immature Granulocytes: 0.05 10*3/uL (ref 0.00–0.07)
Basophils Absolute: 0.1 10*3/uL (ref 0.0–0.1)
Basophils Relative: 1 %
Eosinophils Absolute: 0.4 10*3/uL (ref 0.0–0.5)
Eosinophils Relative: 3 %
HCT: 41.8 % (ref 39.0–52.0)
Hemoglobin: 12.8 g/dL — ABNORMAL LOW (ref 13.0–17.0)
Immature Granulocytes: 0 %
Lymphocytes Relative: 22 %
Lymphs Abs: 2.7 10*3/uL (ref 0.7–4.0)
MCH: 28.5 pg (ref 26.0–34.0)
MCHC: 30.6 g/dL (ref 30.0–36.0)
MCV: 93.1 fL (ref 80.0–100.0)
Monocytes Absolute: 0.9 10*3/uL (ref 0.1–1.0)
Monocytes Relative: 7 %
Neutro Abs: 8.4 10*3/uL — ABNORMAL HIGH (ref 1.7–7.7)
Neutrophils Relative %: 67 %
Platelets: 351 10*3/uL (ref 150–400)
RBC: 4.49 MIL/uL (ref 4.22–5.81)
RDW: 19 % — ABNORMAL HIGH (ref 11.5–15.5)
WBC: 12.5 10*3/uL — ABNORMAL HIGH (ref 4.0–10.5)
nRBC: 0 % (ref 0.0–0.2)

## 2018-12-09 LAB — COMPREHENSIVE METABOLIC PANEL
ALT: 11 U/L (ref 0–44)
AST: 15 U/L (ref 15–41)
Albumin: 3.4 g/dL — ABNORMAL LOW (ref 3.5–5.0)
Alkaline Phosphatase: 62 U/L (ref 38–126)
Anion gap: 7 (ref 5–15)
BUN: 16 mg/dL (ref 8–23)
CO2: 27 mmol/L (ref 22–32)
Calcium: 8.6 mg/dL — ABNORMAL LOW (ref 8.9–10.3)
Chloride: 104 mmol/L (ref 98–111)
Creatinine, Ser: 0.72 mg/dL (ref 0.61–1.24)
GFR calc Af Amer: >60 mL/min (ref >60)
GFR calc non Af Amer: >60 mL/min (ref >60)
Glucose, Bld: 142 mg/dL — ABNORMAL HIGH (ref 70–99)
Potassium: 4 mmol/L (ref 3.5–5.1)
Sodium: 138 mmol/L (ref 135–145)
Total Bilirubin: 0.3 mg/dL (ref 0.3–1.2)
Total Protein: 5.9 g/dL — ABNORMAL LOW (ref 6.5–8.1)

## 2018-12-09 LAB — TYPE AND SCREEN
ABO/RH(D): O POS
Antibody Screen: NEGATIVE

## 2018-12-09 LAB — CBC
HCT: 45.5 % (ref 39.0–52.0)
Hemoglobin: 14 g/dL (ref 13.0–17.0)
MCH: 28.6 pg (ref 26.0–34.0)
MCHC: 30.8 g/dL (ref 30.0–36.0)
MCV: 93 fL (ref 80.0–100.0)
Platelets: 364 10*3/uL (ref 150–400)
RBC: 4.89 MIL/uL (ref 4.22–5.81)
RDW: 19.2 % — ABNORMAL HIGH (ref 11.5–15.5)
WBC: 9.8 10*3/uL (ref 4.0–10.5)
nRBC: 0 % (ref 0.0–0.2)

## 2018-12-09 LAB — POC OCCULT BLOOD, ED: Fecal Occult Bld: POSITIVE — AB

## 2018-12-09 MED ORDER — LACTATED RINGERS IV BOLUS
500.0000 mL | Freq: Once | INTRAVENOUS | Status: AC
  Administered 2018-12-09: 500 mL via INTRAVENOUS

## 2018-12-09 MED ORDER — IOHEXOL 350 MG/ML SOLN
100.0000 mL | Freq: Once | INTRAVENOUS | Status: AC | PRN
  Administered 2018-12-09: 100 mL via INTRAVENOUS

## 2018-12-09 NOTE — ED Provider Notes (Signed)
[facility] EMERGENCY DEPARTMENT Provider Note   CSN: [id number] Arrival date & time: 12/09/18  [id number]     History   Chief Complaint Chief Complaint  Patient presents with   GI Bleeding    HPI [name] is a 85 y.o. male.      Rectal Bleeding Quality:  Maroon Amount:  Moderate Duration:  7 hours Timing:  Constant Chronicity:  Recurrent Context: defecation   Context: not rectal pain   Context comment:  Patient states that he has had intermittent diverticular bleeding approximately every 3 to 4 months and has been considered for partial colectomy previously.  Is on Plavix for CVA Similar prior episodes: yes   Relieved by:  None tried Worsened by:  Nothing Ineffective treatments:  None tried Associated symptoms: no abdominal pain, no dizziness, no epistaxis, no fever, no hematemesis, no light-headedness, no loss of consciousness, no recent illness and no vomiting   Risk factors: anticoagulant use   Risk factors: no hx of colorectal cancer and no hx of colorectal surgery     Past Medical History:  Diagnosis Date   Acute CVA (cerebrovascular accident) ([place]) 07/25/2015   Asthma    Benign essential HTN    Cerebral infarction due to embolism of left middle cerebral artery ([place]) 02/03/2014   Chronic combined systolic and diastolic CHF (congestive heart failure) ([place]) 12/23/2017   Colitis    Colon polyps    Diabetes mellitus type 2, diet-controlled ([place]) 12/22/2013   Diverticulosis    GERD (gastroesophageal reflux disease)    GI bleed    Hemorrhoids    Hiatal hernia    History of esophageal strciture    HLD (hyperlipidemia)    Osteoarthritis    Proctitis    Status post dilation of esophageal narrowing    Stroke [facility])     Patient Active Problem List   Diagnosis Date Noted   Rectal bleed 08/21/2018   Platelet inhibition due to Plavix    Rectal bleeding    Diverticulosis large intestine w/o perforation or abscess  w/bleeding    GERD (gastroesophageal reflux disease) 04/21/2018   Hematochezia    Hypokalemia 12/25/2017   Diverticulosis    Acute blood loss anemia 12/24/2017   Syncope due to orthostatic hypotension 12/23/2017   Chronic combined systolic and diastolic CHF (congestive heart failure) ([place]) 12/23/2017   GI bleed 10/25/2016   BPPV (benign paroxysmal positional vertigo) 10/25/2016   Lower GI bleed 10/24/2016   Syncope 10/24/2016   TIA (transient ischemic attack) 07/22/2016   Benign essential HTN    PAF (paroxysmal atrial fibrillation) ([facility])    Stroke-like symptom 02/22/2016   Leukocytosis 02/22/2016   Acute lower GI bleeding 02/01/2016   History of lower GI bleeding Dec 2017 02/01/2016   Chronic anticoagulation 2/2 history of CVA 01/31/2016   History of CVA (cerebrovascular accident) 01/31/2016   Sinusitis, chronic 12/12/2015   Cough variant asthma 10/31/2015   Insomnia 07/25/2015   HLD (hyperlipidemia) 02/03/2014   Ulnar neuropathy at elbow of right upper extremity 02/03/2014   Cervical radiculopathy 02/03/2014   Seizures ([place])    Diabetes mellitus type II, non insulin dependent ([place]) 12/22/2013   Recurrent ventral hernia 12/15/2011   History of esophageal strciture    Diverticulosis of colon with hemorrhage 06/24/2007    Past Surgical History:  Procedure Laterality Date   ABDOMINAL HERNIA REPAIR     COLONOSCOPY     multiple   COLONOSCOPY WITH PROPOFOL N/A 12/12/2016   Procedure: COLONOSCOPY WITH PROPOFOL;  Surgeon: Gatha Mayer, MD;  Location: [facility] ENDOSCOPY;  Service: Endoscopy;  Laterality: N/A;   ESOPHAGOGASTRODUODENOSCOPY     multiple   INSERTION OF MESH  01/29/2012   Procedure: INSERTION OF MESH;  Surgeon: Gwenyth Ober, MD;  Location: [place];  Service: General;  Laterality: N/A;   NISSEN FUNDOPLICATION     SPLENECTOMY, TOTAL     nontraumatic rupture   VENTRAL HERNIA REPAIR  01/29/2012    WITH MESH   VENTRAL HERNIA REPAIR   01/29/2012   Procedure: HERNIA REPAIR VENTRAL ADULT;  Surgeon: Gwenyth Ober, MD;  Location: [place];  Service: General;  Laterality: N/A;  open recurrent ventral hernia repair with mesh        Home Medications    Prior to Admission medications   Medication Sig Start Date End Date Taking? Authorizing Provider  albuterol (PROAIR HFA) 108 (90 Base) MCG/ACT inhaler Inhale 2 puffs into the lungs every 6 (six) hours as needed for wheezing or shortness of breath.   Yes [provider]  albuterol (PROVENTIL) (2.5 MG/3ML) 0.083% nebulizer solution Take 3 mLs by nebulization every 6 (six) hours as needed for wheezing. 10/04/18  Yes [provider]  budesonide (PULMICORT) 0.5 MG/2ML nebulizer solution Take 2 mLs by nebulization 2 (two) times daily. 09/01/18  Yes [provider]  carvedilol (COREG) 3.125 MG tablet Take 1 tablet (3.125 mg total) by mouth 2 (two) times daily with a meal. 02/26/16  Yes Amin, Ankit Chirag, MD  clopidogrel (PLAVIX) 75 MG tablet Take 75 mg by mouth daily. 07/05/18  Yes [provider]  diphenhydramine-acetaminophen (TYLENOL PM) 25-500 MG TABS tablet Take 2 tablets by mouth at bedtime as needed (sleep).   Yes [provider]  ferrous sulfate 325 (65 FE) MG tablet Take 1 tablet (325 mg total) by mouth 2 (two) times daily with a meal. Patient taking differently: Take 325 mg by mouth daily.  02/07/18  Yes Thurnell Lose, MD  fluticasone (FLONASE) 50 MCG/ACT nasal spray Place 1 spray into both nostrils every morning.  06/18/14  Yes [provider]  Melatonin 10 MG TABS Take 1 tablet by mouth at bedtime.   Yes [provider]  metFORMIN (GLUCOPHAGE) 1000 MG tablet Take 1,000 mg by mouth daily with breakfast.   Yes [provider]  montelukast (SINGULAIR) 10 MG tablet Take 10 mg by mouth at bedtime.   Yes [provider]  polyethylene glycol (MIRALAX / GLYCOLAX) packet Take 17 g by mouth daily.   Yes  [provider]  psyllium (METAMUCIL) 58.6 % powder Take 1 packet by mouth every morning.    Yes [provider]  rOPINIRole (REQUIP) 0.5 MG tablet Take 2 mg by mouth at bedtime.    Yes [provider]  simvastatin (ZOCOR) 40 MG tablet Take 1 tablet (40 mg total) by mouth daily. Patient taking differently: Take 40 mg by mouth at bedtime.  02/26/16  Yes Amin, Ankit Chirag, MD  zolpidem (AMBIEN) 10 MG tablet Take 5 mg by mouth at bedtime as needed for sleep.   Yes [provider]  co-enzyme Q-10 30 MG capsule Take 7 capsules (210 mg total) by mouth daily. Patient not taking: Reported on 12/09/2018 09/28/18   Garvin Fila, MD    Family History Family History  Problem Relation Age of Onset   Alzheimer's disease Mother    Breast cancer Mother    Throat cancer Father    Colon cancer Neg Hx  Colon polyps Neg Hx    Pancreatic cancer Neg Hx    Stomach cancer Neg Hx    Liver disease Neg Hx     Social History Social History   Tobacco Use   Smoking status: Never Smoker   Smokeless tobacco: Never Used  Substance Use Topics   Alcohol use: Yes    Alcohol/week: 2.0 standard drinks    Types: 2 Glasses of wine per week    Comment: daily   Drug use: No     Allergies   Tape and Azithromycin   Review of Systems Review of Systems  Constitutional: Negative for chills and fever.  HENT: Negative for ear pain, nosebleeds and sore throat.   Eyes: Negative for pain and visual disturbance.  Respiratory: Negative for cough and shortness of breath.   Cardiovascular: Negative for chest pain and palpitations.  Gastrointestinal: Positive for anal bleeding, blood in stool, diarrhea and hematochezia. Negative for abdominal distention, abdominal pain, constipation, hematemesis, nausea, rectal pain and vomiting.  Genitourinary: Negative for dysuria and hematuria.  Musculoskeletal: Negative for arthralgias and back pain.  Skin: Negative for color change  and rash.  Neurological: Negative for dizziness, seizures, loss of consciousness, syncope and light-headedness.  All other systems reviewed and are negative.    Physical Exam Updated Vital Signs BP (!) 126/91 (BP Location: Right Arm)    Pulse (!) 105    Temp 98.3 F (36.8 C) (Oral)    Resp 16    SpO2 99%   Physical Exam Vitals signs and nursing note reviewed.  Constitutional:      General: He is not in acute distress.    Appearance: He is well-developed.  HENT:     Head: Normocephalic and atraumatic.  Eyes:     Conjunctiva/sclera: Conjunctivae normal.  Neck:     Musculoskeletal: Neck supple.  Cardiovascular:     Rate and Rhythm: Regular rhythm. Tachycardia present.     Heart sounds: No murmur.     Comments: Heart rate 100 to 105 bpm Pulmonary:     Effort: Pulmonary effort is normal. No respiratory distress.     Breath sounds: Normal breath sounds.  Abdominal:     General: Abdomen is flat. There is no distension.     Palpations: Abdomen is soft. There is no mass.     Tenderness: There is no abdominal tenderness. There is no right CVA tenderness, left CVA tenderness, guarding or rebound.     Hernia: No hernia is present.     Comments: Midline incisional scar nontender  Genitourinary:    Comments: Normal-appearing external male genitalia.  There is a small amount of dried blood in the patient's diaper.  There is small amount of dark red blood in the rectal vault.  No rectal fissure or external hemorrhoids noted.  No internal hemorrhoids palpated. Skin:    General: Skin is warm and dry.     Capillary Refill: Capillary refill takes less than 2 seconds.     Findings: No erythema, lesion or rash.  Neurological:     Mental Status: He is alert and oriented to person, place, and time. Mental status is at baseline.     Cranial Nerves: No cranial nerve deficit.     Motor: Weakness (Baseline left upper and left lower extremity weakness) present.     Gait: Gait abnormal (Patient  requires walker for ambulation due to previous CVA.  No change in his difficulty with moving his upper and lower extremity on the left today.).  Psychiatric:        Mood and Affect: Mood normal.      ED Treatments / Results  Labs (all labs ordered are listed, but only abnormal results are displayed) Labs Reviewed  COMPREHENSIVE METABOLIC PANEL - Abnormal; Notable for the following components:      Result Value   Glucose, Bld 142 (*)    Calcium 8.6 (*)    Total Protein 5.9 (*)    Albumin 3.4 (*)    All other components within normal limits  CBC - Abnormal; Notable for the following components:   RDW 19.2 (*)    All other components within normal limits  CBC WITH DIFFERENTIAL/PLATELET  POC OCCULT BLOOD, ED  TYPE AND SCREEN    EKG None  Radiology No results found.  Procedures Procedures (including critical care time)  Medications Ordered in ED Medications  lactated ringers bolus 500 mL (has no administration in time range)     Initial Impression / Assessment and Plan / ED Course  I have reviewed the triage vital signs and the nursing notes.  Pertinent labs & imaging results that were available during my care of the patient were reviewed by me and considered in my medical decision making (see chart for details).        Patient is an 85 year old male with history and physical exam as above presents emergency department for evaluation of blood per rectum beginning approximately 9 AM this morning.  He has had multiple episodes of similar previously and is currently on Plavix for a previous CVA.  He states that his bleeding has tapered off significantly over the course of the day but is still having some small bowel movements with dark red blood in them.  Denies postural lightheadedness or presyncopal symptoms.  At the time of my initial evaluation patient's heart rate is 100-105 bpm.  Blood pressure stable at 126/91.  Labs were obtained which demonstrated hemoglobin of 14  which is higher than it has been previously at the time of patient initial arrival.  Other labs demonstrated no emergent abnormalities.  Plan is to obtain CTA of the abdomen to evaluate for active arterial bleeding as well as possible diverticulitis in the setting of likely diverticulosis.  Patient has had similar episodes in the past where he has presented without abdominal pain and been found to have findings concerning for diverticulitis on CT scan.  CTA demonstrated no emergent findings and no evidence of active bleeding.  On reevaluation patient continues to look well.  Tachycardia has resolved.  Heart rate 96 at the time of my evaluation immediately prior to patient's discharge.  Hemoglobin down trended slightly to 12.8 however patient is not actively bleeding therefore he is stable to follow-up with primary care physician for repeat hemoglobin preferably Monday but as soon as possible.  He was also informed about aortic atherosclerosis and I recommended that he follow-up with his primary care physician for the same  Disposition: Patient was discharged in stable condition.  Patient was seen in conjunction with my attending physician Dr. Laverta Baltimore  Final Clinical Impressions(s) / ED Diagnoses   Final diagnoses:  None    ED Discharge Orders    None       Romona Curls, MD 12/09/18 [id number]    Margette Fast, MD 12/10/18 [phone]

## 2018-12-09 NOTE — ED Triage Notes (Signed)
Pt woke up this morning and had a BM.Noticed there was bright red blood. Has a history of GI bleeds. Denies pain at this time. No distention noted by EMS Pt alert and oriented X4.   BP 143/91 HR 74 RR 18 98% RA CBG 201 Temp 97.6

## 2018-12-09 NOTE — Discharge Instructions (Addendum)
Please follow-up with your primary care doctor for repeat hemoglobin check preferably on Monday but as soon as possible.  Please also follow-up with your doctor about your finding on imaging which demonstrated atherosclerotic aortic disease without evidence of aneurysm.  For this you will likely need continued monitoring of cholesterol, blood pressure, and other general parameters of health and may require additional imaging in the future.

## 2018-12-09 NOTE — Telephone Encounter (Signed)
Pt's wife called to report that pt is on his way to the hospital for diverticular bleeding.

## 2018-12-16 DIAGNOSIS — K922 Gastrointestinal hemorrhage, unspecified: Secondary | ICD-10-CM | POA: Diagnosis not present

## 2018-12-26 DIAGNOSIS — R69 Illness, unspecified: Secondary | ICD-10-CM | POA: Diagnosis not present

## 2018-12-27 DIAGNOSIS — R69 Illness, unspecified: Secondary | ICD-10-CM | POA: Diagnosis not present

## 2018-12-30 DIAGNOSIS — R69 Illness, unspecified: Secondary | ICD-10-CM | POA: Diagnosis not present

## 2019-01-05 DIAGNOSIS — J309 Allergic rhinitis, unspecified: Secondary | ICD-10-CM | POA: Diagnosis not present

## 2019-01-05 DIAGNOSIS — J45991 Cough variant asthma: Secondary | ICD-10-CM | POA: Diagnosis not present

## 2019-01-16 DIAGNOSIS — E119 Type 2 diabetes mellitus without complications: Secondary | ICD-10-CM | POA: Diagnosis not present

## 2019-01-16 DIAGNOSIS — I639 Cerebral infarction, unspecified: Secondary | ICD-10-CM | POA: Diagnosis not present

## 2019-01-16 DIAGNOSIS — J45909 Unspecified asthma, uncomplicated: Secondary | ICD-10-CM | POA: Diagnosis not present

## 2019-01-16 DIAGNOSIS — I6789 Other cerebrovascular disease: Secondary | ICD-10-CM | POA: Diagnosis not present

## 2019-01-16 DIAGNOSIS — J4540 Moderate persistent asthma, uncomplicated: Secondary | ICD-10-CM | POA: Diagnosis not present

## 2019-01-16 DIAGNOSIS — J45901 Unspecified asthma with (acute) exacerbation: Secondary | ICD-10-CM | POA: Diagnosis not present

## 2019-01-16 DIAGNOSIS — J4541 Moderate persistent asthma with (acute) exacerbation: Secondary | ICD-10-CM | POA: Diagnosis not present

## 2019-01-16 DIAGNOSIS — J4520 Mild intermittent asthma, uncomplicated: Secondary | ICD-10-CM | POA: Diagnosis not present

## 2019-01-16 DIAGNOSIS — I10 Essential (primary) hypertension: Secondary | ICD-10-CM | POA: Diagnosis not present

## 2019-01-16 DIAGNOSIS — G459 Transient cerebral ischemic attack, unspecified: Secondary | ICD-10-CM | POA: Diagnosis not present

## 2019-01-23 DIAGNOSIS — R69 Illness, unspecified: Secondary | ICD-10-CM | POA: Diagnosis not present

## 2019-01-30 DIAGNOSIS — R69 Illness, unspecified: Secondary | ICD-10-CM | POA: Diagnosis not present

## 2019-01-31 DIAGNOSIS — H0102A Squamous blepharitis right eye, upper and lower eyelids: Secondary | ICD-10-CM | POA: Diagnosis not present

## 2019-01-31 DIAGNOSIS — I69898 Other sequelae of other cerebrovascular disease: Secondary | ICD-10-CM | POA: Diagnosis not present

## 2019-01-31 DIAGNOSIS — H35372 Puckering of macula, left eye: Secondary | ICD-10-CM | POA: Diagnosis not present

## 2019-01-31 DIAGNOSIS — H0102B Squamous blepharitis left eye, upper and lower eyelids: Secondary | ICD-10-CM | POA: Diagnosis not present

## 2019-01-31 DIAGNOSIS — Z961 Presence of intraocular lens: Secondary | ICD-10-CM | POA: Diagnosis not present

## 2019-01-31 DIAGNOSIS — E119 Type 2 diabetes mellitus without complications: Secondary | ICD-10-CM | POA: Diagnosis not present

## 2019-01-31 DIAGNOSIS — H43813 Vitreous degeneration, bilateral: Secondary | ICD-10-CM | POA: Diagnosis not present

## 2019-01-31 DIAGNOSIS — H40013 Open angle with borderline findings, low risk, bilateral: Secondary | ICD-10-CM | POA: Diagnosis not present

## 2019-03-31 ENCOUNTER — Inpatient Hospital Stay (HOSPITAL_COMMUNITY)
Admission: EM | Admit: 2019-03-31 | Discharge: 2019-04-05 | DRG: 377 | Disposition: A | Discharge status: Home / Self Care | Payer: Medicare Other | Attending: Internal Medicine | Admitting: Internal Medicine

## 2019-03-31 ENCOUNTER — Other Ambulatory Visit: Payer: Self-pay

## 2019-03-31 ENCOUNTER — Encounter (HOSPITAL_COMMUNITY): Payer: Self-pay | Admitting: Emergency Medicine

## 2019-03-31 DIAGNOSIS — Z803 Family history of malignant neoplasm of breast: Secondary | ICD-10-CM

## 2019-03-31 DIAGNOSIS — K625 Hemorrhage of anus and rectum: Secondary | ICD-10-CM | POA: Diagnosis not present

## 2019-03-31 DIAGNOSIS — K922 Gastrointestinal hemorrhage, unspecified: Secondary | ICD-10-CM

## 2019-03-31 DIAGNOSIS — I5042 Chronic combined systolic (congestive) and diastolic (congestive) heart failure: Secondary | ICD-10-CM | POA: Diagnosis present

## 2019-03-31 DIAGNOSIS — Z8719 Personal history of other diseases of the digestive system: Secondary | ICD-10-CM

## 2019-03-31 DIAGNOSIS — E876 Hypokalemia: Secondary | ICD-10-CM | POA: Diagnosis not present

## 2019-03-31 DIAGNOSIS — Z79899 Other long term (current) drug therapy: Secondary | ICD-10-CM

## 2019-03-31 DIAGNOSIS — Z9081 Acquired absence of spleen: Secondary | ICD-10-CM

## 2019-03-31 DIAGNOSIS — Z820 Family history of epilepsy and other diseases of the nervous system: Secondary | ICD-10-CM

## 2019-03-31 DIAGNOSIS — K5731 Diverticulosis of large intestine without perforation or abscess with bleeding: Principal | ICD-10-CM | POA: Diagnosis present

## 2019-03-31 DIAGNOSIS — R55 Syncope and collapse: Secondary | ICD-10-CM | POA: Diagnosis present

## 2019-03-31 DIAGNOSIS — D649 Anemia, unspecified: Secondary | ICD-10-CM

## 2019-03-31 DIAGNOSIS — Z781 Physical restraint status: Secondary | ICD-10-CM

## 2019-03-31 DIAGNOSIS — K2100 Gastro-esophageal reflux disease with esophagitis, without bleeding: Secondary | ICD-10-CM | POA: Diagnosis present

## 2019-03-31 DIAGNOSIS — K222 Esophageal obstruction: Secondary | ICD-10-CM | POA: Diagnosis present

## 2019-03-31 DIAGNOSIS — Z7902 Long term (current) use of antithrombotics/antiplatelets: Secondary | ICD-10-CM

## 2019-03-31 DIAGNOSIS — Z7984 Long term (current) use of oral hypoglycemic drugs: Secondary | ICD-10-CM

## 2019-03-31 DIAGNOSIS — J45909 Unspecified asthma, uncomplicated: Secondary | ICD-10-CM | POA: Diagnosis present

## 2019-03-31 DIAGNOSIS — E43 Unspecified severe protein-calorie malnutrition: Secondary | ICD-10-CM | POA: Diagnosis present

## 2019-03-31 DIAGNOSIS — Z808 Family history of malignant neoplasm of other organs or systems: Secondary | ICD-10-CM

## 2019-03-31 DIAGNOSIS — Z66 Do not resuscitate: Secondary | ICD-10-CM | POA: Diagnosis present

## 2019-03-31 DIAGNOSIS — E119 Type 2 diabetes mellitus without complications: Secondary | ICD-10-CM

## 2019-03-31 DIAGNOSIS — Z6822 Body mass index (BMI) 22.0-22.9, adult: Secondary | ICD-10-CM

## 2019-03-31 DIAGNOSIS — I110 Hypertensive heart disease with heart failure: Secondary | ICD-10-CM | POA: Diagnosis present

## 2019-03-31 DIAGNOSIS — D62 Acute posthemorrhagic anemia: Secondary | ICD-10-CM | POA: Diagnosis present

## 2019-03-31 DIAGNOSIS — K2970 Gastritis, unspecified, without bleeding: Secondary | ICD-10-CM | POA: Diagnosis present

## 2019-03-31 DIAGNOSIS — G2581 Restless legs syndrome: Secondary | ICD-10-CM | POA: Diagnosis present

## 2019-03-31 DIAGNOSIS — I69320 Aphasia following cerebral infarction: Secondary | ICD-10-CM

## 2019-03-31 DIAGNOSIS — Z20822 Contact with and (suspected) exposure to covid-19: Secondary | ICD-10-CM | POA: Diagnosis present

## 2019-03-31 DIAGNOSIS — E785 Hyperlipidemia, unspecified: Secondary | ICD-10-CM | POA: Diagnosis present

## 2019-03-31 DIAGNOSIS — K449 Diaphragmatic hernia without obstruction or gangrene: Secondary | ICD-10-CM | POA: Diagnosis present

## 2019-03-31 DIAGNOSIS — R569 Unspecified convulsions: Secondary | ICD-10-CM

## 2019-03-31 LAB — CBC WITH DIFFERENTIAL/PLATELET
Abs Immature Granulocytes: 0.08 10*3/uL — ABNORMAL HIGH (ref 0.00–0.07)
Basophils Absolute: 0.1 10*3/uL (ref 0.0–0.1)
Basophils Relative: 1 %
Eosinophils Absolute: 0.3 10*3/uL (ref 0.0–0.5)
Eosinophils Relative: 2 %
HCT: 27.3 % — ABNORMAL LOW (ref 39.0–52.0)
Hemoglobin: 8.2 g/dL — ABNORMAL LOW (ref 13.0–17.0)
Immature Granulocytes: 1 %
Lymphocytes Relative: 10 %
Lymphs Abs: 1.3 10*3/uL (ref 0.7–4.0)
MCH: 26.3 pg (ref 26.0–34.0)
MCHC: 30 g/dL (ref 30.0–36.0)
MCV: 87.5 fL (ref 80.0–100.0)
Monocytes Absolute: 1.1 10*3/uL — ABNORMAL HIGH (ref 0.1–1.0)
Monocytes Relative: 8 %
Neutro Abs: 10.2 10*3/uL — ABNORMAL HIGH (ref 1.7–7.7)
Neutrophils Relative %: 78 %
Platelets: 439 10*3/uL — ABNORMAL HIGH (ref 150–400)
RBC: 3.12 MIL/uL — ABNORMAL LOW (ref 4.22–5.81)
RDW: 18 % — ABNORMAL HIGH (ref 11.5–15.5)
WBC: 13.1 10*3/uL — ABNORMAL HIGH (ref 4.0–10.5)
nRBC: 0 % (ref 0.0–0.2)

## 2019-03-31 LAB — COMPREHENSIVE METABOLIC PANEL
ALT: 11 U/L (ref 0–44)
AST: 15 U/L (ref 15–41)
Albumin: 3 g/dL — ABNORMAL LOW (ref 3.5–5.0)
Alkaline Phosphatase: 47 U/L (ref 38–126)
Anion gap: 7 (ref 5–15)
BUN: 16 mg/dL (ref 8–23)
CO2: 23 mmol/L (ref 22–32)
Calcium: 8.1 mg/dL — ABNORMAL LOW (ref 8.9–10.3)
Chloride: 107 mmol/L (ref 98–111)
Creatinine, Ser: 0.85 mg/dL (ref 0.61–1.24)
GFR calc Af Amer: >60 mL/min (ref >60)
GFR calc non Af Amer: >60 mL/min (ref >60)
Glucose, Bld: 152 mg/dL — ABNORMAL HIGH (ref 70–99)
Potassium: 4.5 mmol/L (ref 3.5–5.1)
Sodium: 137 mmol/L (ref 135–145)
Total Bilirubin: 0.4 mg/dL (ref 0.3–1.2)
Total Protein: 5.3 g/dL — ABNORMAL LOW (ref 6.5–8.1)

## 2019-03-31 LAB — POC OCCULT BLOOD, ED: Fecal Occult Bld: POSITIVE — AB

## 2019-03-31 LAB — PREPARE RBC (CROSSMATCH)

## 2019-03-31 LAB — CBG MONITORING, ED: Glucose-Capillary: 137 mg/dL — ABNORMAL HIGH (ref 70–99)

## 2019-03-31 MED ORDER — ROPINIROLE HCL 1 MG PO TABS
2.0000 mg | ORAL_TABLET | ORAL | Status: AC
  Administered 2019-04-01: 2 mg via ORAL
  Filled 2019-03-31: qty 2

## 2019-03-31 MED ORDER — SODIUM CHLORIDE 0.9 % IV SOLN: INTRAVENOUS | Status: DC

## 2019-03-31 MED ORDER — ALBUTEROL SULFATE HFA 108 (90 BASE) MCG/ACT IN AERS
8.0000 | INHALATION_SPRAY | RESPIRATORY_TRACT | Status: DC | PRN
  Administered 2019-03-31: 8 via RESPIRATORY_TRACT
  Filled 2019-03-31: qty 6.7

## 2019-03-31 MED ORDER — SODIUM CHLORIDE 0.9% IV SOLUTION: Freq: Once | INTRAVENOUS | Status: AC

## 2019-03-31 NOTE — ED Triage Notes (Signed)
Pt arrived from home via [facility] c/o rectal bleeding w/ 1 syncopal episode w/o fall witnessed by EMS. Per pt bleeding started today. Enroute pt was given 1 liter of fluid and 4mg  zofran. Also pulling in pt developed slurred speech and slight aphaisa. Provider at bedside. Currently a+ox4 VSS

## 2019-03-31 NOTE — ED Provider Notes (Signed)
[facility] EMERGENCY DEPARTMENT Provider Note   CSN: [id number] Arrival date & time: 03/31/19  2223     History Chief Complaint  Patient presents with  . Rectal Bleeding    [name] is a 86 y.o. male.  Patient is an 86 year old male with a history of prior stroke currently on Plavix, recurrent GI bleeding from diverticulosis, diabetes, CHF presenting today with a complaint of rectal bleeding.  Paramedics report that this evening patient started having bloody bowel movements.  While sitting on the toilet he started feeling dizzy and his wife helped him to the floor.  EMS noted bright red blood in the toilet without stool present.  With EMS present patient had a syncopal event which was brief in nature.  He is otherwise been awake and alert.  Patient's blood pressure was in the low 100s and he received 1 L of fluid.  He denies any abdominal pain but did have some nausea that was treated with Zofran in route.  He denies any chest pain but states he has some mild shortness of breath.  EMS noted he seemed slightly aphasic prior to arrival but has a history of this related to prior stroke.  Patient sees [facility] and reports that his last GI bleed was last year.  The history is provided by the patient, the spouse and the EMS personnel.  Rectal Bleeding Quality:  Bright red      Past Medical History:  Diagnosis Date  . Acute CVA (cerebrovascular accident) ([place]) 07/25/2015  . Asthma   . Benign essential HTN   . Cerebral infarction due to embolism of left middle cerebral artery ([place]) 02/03/2014  . Chronic combined systolic and diastolic CHF (congestive heart failure) ([place]) 12/23/2017  . Colitis   . Colon polyps   . Diabetes mellitus type 2, diet-controlled ([place]) 12/22/2013  . Diverticulosis   . GERD (gastroesophageal reflux disease)   . GI bleed   . Hemorrhoids   . Hiatal hernia   . History of esophageal strciture   . HLD (hyperlipidemia)   . Osteoarthritis   .  Proctitis   . Status post dilation of esophageal narrowing   . Stroke [facility])     Patient Active Problem List   Diagnosis Date Noted  . Rectal bleed 08/21/2018  . Platelet inhibition due to Plavix   . Rectal bleeding   . Diverticulosis large intestine w/o perforation or abscess w/bleeding   . GERD (gastroesophageal reflux disease) 04/21/2018  . Hematochezia   . Hypokalemia 12/25/2017  . Diverticulosis   . Acute blood loss anemia 12/24/2017  . Syncope due to orthostatic hypotension 12/23/2017  . Chronic combined systolic and diastolic CHF (congestive heart failure) ([place]) 12/23/2017  . GI bleed 10/25/2016  . BPPV (benign paroxysmal positional vertigo) 10/25/2016  . Lower GI bleed 10/24/2016  . Syncope 10/24/2016  . TIA (transient ischemic attack) 07/22/2016  . Benign essential HTN   . PAF (paroxysmal atrial fibrillation) ([place])   . Stroke-like symptom 02/22/2016  . Leukocytosis 02/22/2016  . Acute lower GI bleeding 02/01/2016  . History of lower GI bleeding Dec 2017 02/01/2016  . Chronic anticoagulation 2/2 history of CVA 01/31/2016  . History of CVA (cerebrovascular accident) 01/31/2016  . Sinusitis, chronic 12/12/2015  . Cough variant asthma 10/31/2015  . Insomnia 07/25/2015  . HLD (hyperlipidemia) 02/03/2014  . Ulnar neuropathy at elbow of right upper extremity 02/03/2014  . Cervical radiculopathy 02/03/2014  . Seizures ([place])   . Diabetes mellitus type II,  non insulin dependent ([place]) 12/22/2013  . Recurrent ventral hernia 12/15/2011  . History of esophageal strciture   . Diverticulosis of colon with hemorrhage 06/24/2007    Past Surgical History:  Procedure Laterality Date  . ABDOMINAL HERNIA REPAIR    . COLONOSCOPY     multiple  . COLONOSCOPY WITH PROPOFOL N/A 12/12/2016   Procedure: COLONOSCOPY WITH PROPOFOL;  Surgeon: Gatha Mayer, MD;  Location: [facility] ENDOSCOPY;  Service: Endoscopy;  Laterality: N/A;  . ESOPHAGOGASTRODUODENOSCOPY     multiple  . INSERTION OF  MESH  01/29/2012   Procedure: INSERTION OF MESH;  Surgeon: Gwenyth Ober, MD;  Location: [facility];  Service: General;  Laterality: N/A;  . NISSEN FUNDOPLICATION    . SPLENECTOMY, TOTAL     nontraumatic rupture  . VENTRAL HERNIA REPAIR  01/29/2012    WITH MESH  . VENTRAL HERNIA REPAIR  01/29/2012   Procedure: HERNIA REPAIR VENTRAL ADULT;  Surgeon: Gwenyth Ober, MD;  Location: [facility];  Service: General;  Laterality: N/A;  open recurrent ventral hernia repair with mesh       Family History  Problem Relation Age of Onset  . Alzheimer's disease Mother   . Breast cancer Mother   . Throat cancer Father   . Colon cancer Neg Hx   . Colon polyps Neg Hx   . Pancreatic cancer Neg Hx   . Stomach cancer Neg Hx   . Liver disease Neg Hx     Social History   Tobacco Use  . Smoking status: Never Smoker  . Smokeless tobacco: Never Used  Substance Use Topics  . Alcohol use: Yes    Alcohol/week: 2.0 standard drinks    Types: 2 Glasses of wine per week    Comment: daily  . Drug use: No    Home Medications Prior to Admission medications   Medication Sig Start Date End Date Taking? Authorizing Provider  albuterol (PROAIR HFA) 108 (90 Base) MCG/ACT inhaler Inhale 2 puffs into the lungs every 6 (six) hours as needed for wheezing or shortness of breath.    [provider]  albuterol (PROVENTIL) (2.5 MG/3ML) 0.083% nebulizer solution Take 3 mLs by nebulization every 6 (six) hours as needed for wheezing. 10/04/18   [provider]  budesonide (PULMICORT) 0.5 MG/2ML nebulizer solution Take 2 mLs by nebulization 2 (two) times daily. 09/01/18   [provider]  carvedilol (COREG) 3.125 MG tablet Take 1 tablet (3.125 mg total) by mouth 2 (two) times daily with a meal. 02/26/16   Amin, Jeanella Flattery, MD  clopidogrel (PLAVIX) 75 MG tablet Take 75 mg by mouth daily. 07/05/18   [provider]  co-enzyme Q-10 30 MG capsule Take 7 capsules (210 mg total) by mouth daily. Patient  not taking: Reported on 12/09/2018 09/28/18   Garvin Fila, MD  diphenhydramine-acetaminophen (TYLENOL PM) 25-500 MG TABS tablet Take 2 tablets by mouth at bedtime as needed (sleep).    [provider]  ferrous sulfate 325 (65 FE) MG tablet Take 1 tablet (325 mg total) by mouth 2 (two) times daily with a meal. Patient taking differently: Take 325 mg by mouth daily.  02/07/18   Thurnell Lose, MD  fluticasone (FLONASE) 50 MCG/ACT nasal spray Place 1 spray into both nostrils every morning.  06/18/14   [provider]  Melatonin 10 MG TABS Take 1 tablet by mouth at bedtime.    [provider]  metFORMIN (GLUCOPHAGE) 1000 MG tablet Take 1,000 mg by mouth  daily with breakfast.    [provider]  montelukast (SINGULAIR) 10 MG tablet Take 10 mg by mouth at bedtime.    [provider]  polyethylene glycol (MIRALAX / GLYCOLAX) packet Take 17 g by mouth daily.    [provider]  psyllium (METAMUCIL) 58.6 % powder Take 1 packet by mouth every morning.     [provider]  rOPINIRole (REQUIP) 0.5 MG tablet Take 2 mg by mouth at bedtime.     [provider]  simvastatin (ZOCOR) 40 MG tablet Take 1 tablet (40 mg total) by mouth daily. Patient taking differently: Take 40 mg by mouth at bedtime.  02/26/16   Amin, Jeanella Flattery, MD  zolpidem (AMBIEN) 10 MG tablet Take 5 mg by mouth at bedtime as needed for sleep.    [provider]    Allergies    Tape and Azithromycin  Review of Systems   Review of Systems  Gastrointestinal: Positive for hematochezia.  All other systems reviewed and are negative.   Physical Exam Updated Vital Signs BP 122/74   Pulse 96   Resp (!) 22   Ht 5\' 10"  (1.778 m)   Wt 72.1 kg   SpO2 100%   BMI 22.81 kg/m   Physical Exam Vitals and nursing note reviewed.  Constitutional:      General: He is not in acute distress.    Appearance: Normal appearance. He is well-developed and normal weight.    HENT:     Head: Normocephalic and atraumatic.  Eyes:     Conjunctiva/sclera: Conjunctivae normal.     Pupils: Pupils are equal, round, and reactive to light.  Cardiovascular:     Rate and Rhythm: Regular rhythm. Tachycardia present.     Heart sounds: No murmur.  Pulmonary:     Effort: Pulmonary effort is normal. No respiratory distress.     Breath sounds: Wheezing present. No rales.  Abdominal:     General: There is no distension.     Palpations: Abdomen is soft.     Tenderness: There is no abdominal tenderness. There is no guarding or rebound.  Genitourinary:    Rectum: Guaiac result positive.     Comments: Blood-tinged mucus present on rectal exam.  No significant stool present at this time.  No external hemorrhoids noted Musculoskeletal:        General: No tenderness. Normal range of motion.     Cervical back: Normal range of motion and neck supple.  Skin:    General: Skin is warm and dry.     Coloration: Skin is pale.     Findings: No erythema or rash.  Neurological:     General: No focal deficit present.     Mental Status: He is alert and oriented to person, place, and time.     Comments: Patient is moving all extremities without difficulty.  No significant aphasia or slurred speech at this time.  Psychiatric:        Mood and Affect: Mood normal.        Behavior: Behavior normal.        Thought Content: Thought content normal.     ED Results / Procedures / Treatments   Labs (all labs ordered are listed, but only abnormal results are displayed) Labs Reviewed  CBC WITH DIFFERENTIAL/PLATELET - Abnormal; Notable for the following components:      Result Value   WBC 13.1 (*)    RBC 3.12 (*)    Hemoglobin 8.2 (*)  HCT 27.3 (*)    RDW 18.0 (*)    Platelets 439 (*)    Neutro Abs 10.2 (*)    Monocytes Absolute 1.1 (*)    Abs Immature Granulocytes 0.08 (*)    All other components within normal limits  POC OCCULT BLOOD, ED - Abnormal; Notable for the following  components:   Fecal Occult Bld POSITIVE (*)    All other components within normal limits  CBG MONITORING, ED - Abnormal; Notable for the following components:   Glucose-Capillary 137 (*)    All other components within normal limits  COMPREHENSIVE METABOLIC PANEL  TYPE AND SCREEN  PREPARE RBC (CROSSMATCH)    EKG EKG Interpretation  Date/Time:  Friday March 31 2019 22:34:31 EST Ventricular Rate:  93 PR Interval:    QRS Duration: 92 QT Interval:  384 QTC Calculation: 473 R Axis:   -37 Text Interpretation: Sinus rhythm Ventricular premature complex Left axis deviation Borderline low voltage, extremity leads No significant change since last tracing Confirmed by Blanchie Dessert [phone]) on 03/31/2019 10:49:57 PM   Radiology No results found.  Procedures Procedures (including critical care time)  Medications Ordered in ED Medications  0.9 %  sodium chloride infusion (has no administration in time range)    ED Course  I have reviewed the triage vital signs and the nursing notes.  Pertinent labs & imaging results that were available during my care of the patient were reviewed by me and considered in my medical decision making (see chart for details).    MDM Rules/Calculators/A&P                      Elderly male on Plavix with prior history of GI bleeding presenting this evening with recurrent GI bleeding and a syncopal event at home.  Patient currently is awake and alert.  Blood pressure is 150/70 and repeat was 122/74.  Patient is wheezing diffusely with a history of wheezing and requiring inhalers at home.  Patient will be given a dose of albuterol.  He does have blood on rectal exam.  Patient started on maintenance fluids as he is already received a liter bolus of fluid in route.  Labs pending.  Concern for recurrent diverticular bleed.  11:29 PM pt's hemoglobin has dropped today from 12 in October to 8.  Mild leukocytosis of 13,000.  Hemoccult positive.  CMP is pending.   Spoke with Dr. Loletha Carrow with [facility] and he recommended if patient has a bloody bowel movement here they highly suspect diverticular bleed and he needs to go immediately for CT angio of his abdomen and pelvis and if there is anything positive he would need to go to IR.  Patient will be transfused 2 units of blood for his new anemia.  Plan will be to admit the patient.  CRITICAL CARE Performed by: Dusten Ellinwood Total critical care time: 30 minutes Critical care time was exclusive of separately billable procedures and treating other patients. Critical care was necessary to treat or prevent imminent or life-threatening deterioration. Critical care was time spent personally by me on the following activities: development of treatment plan with patient and/or surrogate as well as nursing, discussions with consultants, evaluation of patient's response to treatment, examination of patient, obtaining history from patient or surrogate, ordering and performing treatments and interventions, ordering and review of laboratory studies, ordering and review of radiographic studies, pulse oximetry and re-evaluation of patient's condition.   Final Clinical Impression(s) / ED Diagnoses Final diagnoses:  Lower  GI bleed  Symptomatic anemia  Syncope, unspecified syncope type    Rx / DC Orders ED Discharge Orders    None       Blanchie Dessert, MD 03/31/19 [phone]

## 2019-04-01 ENCOUNTER — Inpatient Hospital Stay (HOSPITAL_COMMUNITY): Payer: Medicare Other

## 2019-04-01 ENCOUNTER — Observation Stay (HOSPITAL_COMMUNITY): Payer: Medicare Other

## 2019-04-01 ENCOUNTER — Other Ambulatory Visit: Payer: Self-pay

## 2019-04-01 DIAGNOSIS — K625 Hemorrhage of anus and rectum: Secondary | ICD-10-CM | POA: Diagnosis present

## 2019-04-01 DIAGNOSIS — R55 Syncope and collapse: Secondary | ICD-10-CM | POA: Diagnosis present

## 2019-04-01 DIAGNOSIS — K2100 Gastro-esophageal reflux disease with esophagitis, without bleeding: Secondary | ICD-10-CM | POA: Diagnosis present

## 2019-04-01 DIAGNOSIS — E876 Hypokalemia: Secondary | ICD-10-CM | POA: Diagnosis not present

## 2019-04-01 DIAGNOSIS — Z7902 Long term (current) use of antithrombotics/antiplatelets: Secondary | ICD-10-CM | POA: Diagnosis not present

## 2019-04-01 DIAGNOSIS — R569 Unspecified convulsions: Secondary | ICD-10-CM | POA: Diagnosis present

## 2019-04-01 DIAGNOSIS — Z66 Do not resuscitate: Secondary | ICD-10-CM | POA: Diagnosis present

## 2019-04-01 DIAGNOSIS — K222 Esophageal obstruction: Secondary | ICD-10-CM | POA: Diagnosis present

## 2019-04-01 DIAGNOSIS — I69320 Aphasia following cerebral infarction: Secondary | ICD-10-CM | POA: Diagnosis not present

## 2019-04-01 DIAGNOSIS — K5731 Diverticulosis of large intestine without perforation or abscess with bleeding: Principal | ICD-10-CM

## 2019-04-01 DIAGNOSIS — D62 Acute posthemorrhagic anemia: Secondary | ICD-10-CM | POA: Diagnosis present

## 2019-04-01 DIAGNOSIS — I5042 Chronic combined systolic (congestive) and diastolic (congestive) heart failure: Secondary | ICD-10-CM | POA: Diagnosis present

## 2019-04-01 DIAGNOSIS — I110 Hypertensive heart disease with heart failure: Secondary | ICD-10-CM | POA: Diagnosis present

## 2019-04-01 DIAGNOSIS — J45909 Unspecified asthma, uncomplicated: Secondary | ICD-10-CM | POA: Diagnosis present

## 2019-04-01 DIAGNOSIS — K921 Melena: Secondary | ICD-10-CM

## 2019-04-01 DIAGNOSIS — E43 Unspecified severe protein-calorie malnutrition: Secondary | ICD-10-CM | POA: Diagnosis present

## 2019-04-01 DIAGNOSIS — E785 Hyperlipidemia, unspecified: Secondary | ICD-10-CM | POA: Diagnosis present

## 2019-04-01 DIAGNOSIS — Z20822 Contact with and (suspected) exposure to covid-19: Secondary | ICD-10-CM | POA: Diagnosis present

## 2019-04-01 DIAGNOSIS — Z820 Family history of epilepsy and other diseases of the nervous system: Secondary | ICD-10-CM | POA: Diagnosis not present

## 2019-04-01 DIAGNOSIS — K449 Diaphragmatic hernia without obstruction or gangrene: Secondary | ICD-10-CM | POA: Diagnosis present

## 2019-04-01 DIAGNOSIS — E119 Type 2 diabetes mellitus without complications: Secondary | ICD-10-CM | POA: Diagnosis present

## 2019-04-01 DIAGNOSIS — G2581 Restless legs syndrome: Secondary | ICD-10-CM | POA: Diagnosis present

## 2019-04-01 DIAGNOSIS — K2970 Gastritis, unspecified, without bleeding: Secondary | ICD-10-CM | POA: Diagnosis present

## 2019-04-01 DIAGNOSIS — Z803 Family history of malignant neoplasm of breast: Secondary | ICD-10-CM | POA: Diagnosis not present

## 2019-04-01 DIAGNOSIS — Z6822 Body mass index (BMI) 22.0-22.9, adult: Secondary | ICD-10-CM | POA: Diagnosis not present

## 2019-04-01 DIAGNOSIS — Z7984 Long term (current) use of oral hypoglycemic drugs: Secondary | ICD-10-CM | POA: Diagnosis not present

## 2019-04-01 DIAGNOSIS — K922 Gastrointestinal hemorrhage, unspecified: Secondary | ICD-10-CM | POA: Diagnosis not present

## 2019-04-01 HISTORY — PX: IR US GUIDE VASC ACCESS RIGHT: IMG2390

## 2019-04-01 HISTORY — PX: IR ANGIOGRAM SELECTIVE EACH ADDITIONAL VESSEL: IMG667

## 2019-04-01 HISTORY — PX: IR ANGIOGRAM VISCERAL SELECTIVE: IMG657

## 2019-04-01 LAB — CBC
HCT: 25.1 % — ABNORMAL LOW (ref 39.0–52.0)
HCT: 22.5 % — ABNORMAL LOW (ref 39.0–52.0)
Hemoglobin: 8 g/dL — ABNORMAL LOW (ref 13.0–17.0)
Hemoglobin: 7 g/dL — ABNORMAL LOW (ref 13.0–17.0)
MCH: 27.1 pg (ref 26.0–34.0)
MCH: 26.7 pg (ref 26.0–34.0)
MCHC: 31.9 g/dL (ref 30.0–36.0)
MCHC: 31.1 g/dL (ref 30.0–36.0)
MCV: 85.1 fL (ref 80.0–100.0)
MCV: 85.9 fL (ref 80.0–100.0)
Platelets: 313 10*3/uL (ref 150–400)
Platelets: 306 10*3/uL (ref 150–400)
RBC: 2.95 MIL/uL — ABNORMAL LOW (ref 4.22–5.81)
RBC: 2.62 MIL/uL — ABNORMAL LOW (ref 4.22–5.81)
RDW: 16.5 % — ABNORMAL HIGH (ref 11.5–15.5)
RDW: 16.9 % — ABNORMAL HIGH (ref 11.5–15.5)
WBC: 10.2 10*3/uL (ref 4.0–10.5)
WBC: 12.7 10*3/uL — ABNORMAL HIGH (ref 4.0–10.5)
nRBC: 0.2 % (ref 0.0–0.2)
nRBC: 0 % (ref 0.0–0.2)

## 2019-04-01 LAB — COMPREHENSIVE METABOLIC PANEL
ALT: 10 U/L (ref 0–44)
AST: 13 U/L — ABNORMAL LOW (ref 15–41)
Albumin: 2.5 g/dL — ABNORMAL LOW (ref 3.5–5.0)
Alkaline Phosphatase: 39 U/L (ref 38–126)
Anion gap: 8 (ref 5–15)
BUN: 18 mg/dL (ref 8–23)
CO2: 26 mmol/L (ref 22–32)
Calcium: 7.6 mg/dL — ABNORMAL LOW (ref 8.9–10.3)
Chloride: 102 mmol/L (ref 98–111)
Creatinine, Ser: 0.8 mg/dL (ref 0.61–1.24)
GFR calc Af Amer: >60 mL/min (ref >60)
GFR calc non Af Amer: >60 mL/min (ref >60)
Glucose, Bld: 157 mg/dL — ABNORMAL HIGH (ref 70–99)
Potassium: 4.2 mmol/L (ref 3.5–5.1)
Sodium: 136 mmol/L (ref 135–145)
Total Bilirubin: 0.6 mg/dL (ref 0.3–1.2)
Total Protein: 4.5 g/dL — ABNORMAL LOW (ref 6.5–8.1)

## 2019-04-01 LAB — GLUCOSE, CAPILLARY
Glucose-Capillary: 150 mg/dL — ABNORMAL HIGH (ref 70–99)
Glucose-Capillary: 151 mg/dL — ABNORMAL HIGH (ref 70–99)
Glucose-Capillary: 157 mg/dL — ABNORMAL HIGH (ref 70–99)
Glucose-Capillary: 167 mg/dL — ABNORMAL HIGH (ref 70–99)
Glucose-Capillary: 132 mg/dL — ABNORMAL HIGH (ref 70–99)
Glucose-Capillary: 141 mg/dL — ABNORMAL HIGH (ref 70–99)
Glucose-Capillary: 124 mg/dL — ABNORMAL HIGH (ref 70–99)

## 2019-04-01 LAB — TROPONIN I (HIGH SENSITIVITY): Troponin I (High Sensitivity): 9 ng/L (ref <18)

## 2019-04-01 LAB — SARS CORONAVIRUS 2 (TAT 6-24 HRS): SARS Coronavirus 2: NEGATIVE

## 2019-04-01 LAB — PREPARE RBC (CROSSMATCH)

## 2019-04-01 MED ORDER — IOHEXOL 300 MG/ML  SOLN
150.0000 mL | Freq: Once | INTRAMUSCULAR | Status: AC | PRN
  Administered 2019-04-01: 58 mL via INTRA_ARTERIAL

## 2019-04-01 MED ORDER — MIDAZOLAM HCL 2 MG/2ML IJ SOLN
INTRAMUSCULAR | Status: AC | PRN
  Administered 2019-04-01: 1 mg via INTRAVENOUS
  Administered 2019-04-01 (×2): 0.5 mg via INTRAVENOUS

## 2019-04-01 MED ORDER — FENTANYL CITRATE (PF) 100 MCG/2ML IJ SOLN
INTRAMUSCULAR | Status: AC | PRN
  Administered 2019-04-01 (×3): 25 ug via INTRAVENOUS

## 2019-04-01 MED ORDER — IOHEXOL 300 MG/ML  SOLN
100.0000 mL | Freq: Once | INTRAMUSCULAR | Status: AC | PRN
  Administered 2019-04-01: 25 mL via INTRA_ARTERIAL

## 2019-04-01 MED ORDER — ACETAMINOPHEN 325 MG PO TABS
650.0000 mg | ORAL_TABLET | Freq: Four times a day (QID) | ORAL | Status: DC | PRN
  Administered 2019-04-02 – 2019-04-03 (×2): 650 mg via ORAL
  Filled 2019-04-01 (×2): qty 2

## 2019-04-01 MED ORDER — IOHEXOL 350 MG/ML SOLN
100.0000 mL | Freq: Once | INTRAVENOUS | Status: AC | PRN
  Administered 2019-04-01: 100 mL via INTRAVENOUS

## 2019-04-01 MED ORDER — FENTANYL CITRATE (PF) 100 MCG/2ML IJ SOLN
INTRAMUSCULAR | Status: AC
  Filled 2019-04-01: qty 2

## 2019-04-01 MED ORDER — DIPHENHYDRAMINE-APAP (SLEEP) 25-500 MG PO TABS: 2.0000 | ORAL_TABLET | Freq: Every evening | ORAL | Status: DC | PRN

## 2019-04-01 MED ORDER — SODIUM CHLORIDE 0.9 % IV SOLN
INTRAVENOUS | Status: AC | PRN
  Administered 2019-04-01: 10 mL/h via INTRAVENOUS

## 2019-04-01 MED ORDER — SODIUM CHLORIDE 0.9% FLUSH
3.0000 mL | Freq: Two times a day (BID) | INTRAVENOUS | Status: DC
  Administered 2019-04-01 – 2019-04-05 (×7): 3 mL via INTRAVENOUS

## 2019-04-01 MED ORDER — ACETAMINOPHEN 650 MG RE SUPP: 650.0000 mg | Freq: Four times a day (QID) | RECTAL | Status: DC | PRN

## 2019-04-01 MED ORDER — NITROGLYCERIN 1 MG/10 ML FOR IR/CATH LAB
INTRA_ARTERIAL | Status: AC
  Filled 2019-04-01: qty 10

## 2019-04-01 MED ORDER — LIDOCAINE HCL 1 % IJ SOLN
INTRAMUSCULAR | Status: AC
  Filled 2019-04-01: qty 20

## 2019-04-01 MED ORDER — MELATONIN 3 MG PO TABS
9.0000 mg | ORAL_TABLET | Freq: Every day | ORAL | Status: DC
  Administered 2019-04-01 – 2019-04-04 (×4): 9 mg via ORAL
  Filled 2019-04-01 (×5): qty 3

## 2019-04-01 MED ORDER — BUDESONIDE 0.5 MG/2ML IN SUSP
2.0000 mL | Freq: Two times a day (BID) | RESPIRATORY_TRACT | Status: DC
  Administered 2019-04-01 – 2019-04-05 (×7): 0.5 mg via RESPIRATORY_TRACT
  Filled 2019-04-01 (×8): qty 2

## 2019-04-01 MED ORDER — ZOLPIDEM TARTRATE 5 MG PO TABS: 5.0000 mg | ORAL_TABLET | Freq: Every evening | ORAL | Status: DC | PRN

## 2019-04-01 MED ORDER — SODIUM CHLORIDE 0.9% IV SOLUTION: Freq: Once | INTRAVENOUS | Status: AC

## 2019-04-01 MED ORDER — CARVEDILOL 3.125 MG PO TABS
3.1250 mg | ORAL_TABLET | Freq: Two times a day (BID) | ORAL | Status: DC
  Administered 2019-04-01 – 2019-04-05 (×8): 3.125 mg via ORAL
  Filled 2019-04-01 (×9): qty 1

## 2019-04-01 MED ORDER — MIDAZOLAM HCL 2 MG/2ML IJ SOLN
INTRAMUSCULAR | Status: AC
  Filled 2019-04-01: qty 2

## 2019-04-01 MED ORDER — DIPHENHYDRAMINE HCL 25 MG PO CAPS: 50.0000 mg | ORAL_CAPSULE | Freq: Every evening | ORAL | Status: DC | PRN

## 2019-04-01 MED ORDER — INSULIN ASPART 100 UNIT/ML ~~LOC~~ SOLN
0.0000 [IU] | SUBCUTANEOUS | Status: DC
  Administered 2019-04-01: 1 [IU] via SUBCUTANEOUS
  Administered 2019-04-01: 2 [IU] via SUBCUTANEOUS
  Administered 2019-04-01: 1 [IU] via SUBCUTANEOUS
  Administered 2019-04-01 – 2019-04-02 (×3): 2 [IU] via SUBCUTANEOUS
  Administered 2019-04-02: 1 [IU] via SUBCUTANEOUS
  Administered 2019-04-02 – 2019-04-03 (×2): 2 [IU] via SUBCUTANEOUS
  Administered 2019-04-04 – 2019-04-05 (×3): 1 [IU] via SUBCUTANEOUS

## 2019-04-01 MED ORDER — SODIUM CHLORIDE 0.9 % IV SOLN: INTRAVENOUS | Status: DC

## 2019-04-01 MED ORDER — MONTELUKAST SODIUM 10 MG PO TABS
10.0000 mg | ORAL_TABLET | Freq: Every day | ORAL | Status: DC
  Administered 2019-04-01 – 2019-04-04 (×4): 10 mg via ORAL
  Filled 2019-04-01 (×4): qty 1

## 2019-04-01 MED ORDER — FLUTICASONE PROPIONATE 50 MCG/ACT NA SUSP
1.0000 | Freq: Every day | NASAL | Status: DC
  Administered 2019-04-01 – 2019-04-05 (×4): 1 via NASAL
  Filled 2019-04-01: qty 16

## 2019-04-01 MED ORDER — TECHNETIUM TC 99M-LABELED RED BLOOD CELLS IV KIT
27.2000 | PACK | Freq: Once | INTRAVENOUS | Status: AC | PRN
  Administered 2019-04-01: 27.2 via INTRAVENOUS

## 2019-04-01 MED ORDER — SODIUM CHLORIDE 0.9 % IV SOLN: 250.0000 mL | INTRAVENOUS | Status: DC | PRN

## 2019-04-01 MED ORDER — SODIUM CHLORIDE 0.9 % IV SOLN: INTRAVENOUS | Status: AC

## 2019-04-01 MED ORDER — LIDOCAINE HCL (PF) 1 % IJ SOLN
INTRAMUSCULAR | Status: AC | PRN
  Administered 2019-04-01: 5 mL

## 2019-04-01 MED ORDER — NITROGLYCERIN 1 MG/10 ML FOR IR/CATH LAB
INTRA_ARTERIAL | Status: AC | PRN
  Administered 2019-04-01: 100 ug via INTRA_ARTERIAL

## 2019-04-01 MED ORDER — SIMVASTATIN 20 MG PO TABS
40.0000 mg | ORAL_TABLET | Freq: Every day | ORAL | Status: DC
  Administered 2019-04-01 – 2019-04-04 (×4): 40 mg via ORAL
  Filled 2019-04-01 (×4): qty 2

## 2019-04-01 MED ORDER — ROPINIROLE HCL 1 MG PO TABS
2.0000 mg | ORAL_TABLET | Freq: Every day | ORAL | Status: DC
  Administered 2019-04-01 – 2019-04-04 (×4): 2 mg via ORAL
  Filled 2019-04-01 (×4): qty 2

## 2019-04-01 MED ORDER — ALBUTEROL SULFATE (2.5 MG/3ML) 0.083% IN NEBU: 3.0000 mL | INHALATION_SOLUTION | Freq: Four times a day (QID) | RESPIRATORY_TRACT | Status: DC | PRN

## 2019-04-01 MED ORDER — ACETAMINOPHEN 500 MG PO TABS: 1000.0000 mg | ORAL_TABLET | Freq: Every evening | ORAL | Status: DC | PRN

## 2019-04-01 MED ORDER — FUROSEMIDE 10 MG/ML IJ SOLN
20.0000 mg | Freq: Once | INTRAMUSCULAR | Status: AC
  Administered 2019-04-01: 20 mg via INTRAVENOUS
  Filled 2019-04-01: qty 2

## 2019-04-01 MED ORDER — SODIUM CHLORIDE 0.9% FLUSH: 3.0000 mL | INTRAVENOUS | Status: DC | PRN

## 2019-04-01 NOTE — Consult Note (Addendum)
Referring Provider:  [facility]         Primary Care Physician:  Seward Carol, MD Primary Gastroenterologist: Oretha Caprice, MD            We were asked to see this patient for:    GI bleed              ASSESSMENT /  PLAN   86 year old male with PMH significant for hyperlipidemia, DM 2, CHF, CVA on Plavix, asthma, diverticulosis with history of diverticular bleed, remote Niesen fundoplication, splenectomy  1.  GI bleed with acute anemia and syncope, presumably diverticular hemorrhage  on Plavix.  He does have a history of diverticular bleeding. Ischemic colitis not excluded but seems less likely.   Last colonoscopy for bleeding was in 2018.  The prep was poor but given advanced age there were no plans for repeat exam.   CT angiogram around 5 AM this morning was negative for active bleeding.  He just passed a large amount of dark red blood while I was in the room -Supportive care for now.  He is getting IV fluids. -He is getting 2 of 2 units of blood right now -Follow-up posttransfusion H&H  -If continues to have episodes of hematochezia will send for repeat CT angiogram versus tagged red blood cell scan  -Plavix is on hold  2.  History of CVA, on Plavix.  Apparently had some neurologic symptoms in the form of aphasia this morning when EMS arrived.  This seems to have resolved.  Related to acute anemia?  Head CT scan without acute changes  3.  History of iron deficiency anemia March 2020.  On chronic iron at home.  He is normocytic.  Baseline hemoglobin seems to be around 12-13.    HPI:    Chief Complaint: Rectal bleeding  [name] is a 86 y.o. male with PMH as listed below.  He is on chronic Plavix for history of CVA.  Last night after dinner patient began having some mild lower abdominal discomfort.  He subsequently had an episode of hematochezia.  EMS described the blood as being bright red.  He was hemodynamically stable in the ED.  Hemoglobin 8.2 down from 12.8 in  October.  Normal BUN.  Last Plavix dose was last night . He complains of weakness, mild nausea. He has had 2 or 3 additional episodes of hematochezia since arrival to the hospital.  CT angiogram today negative for acute bleed around 5 AM this morning  Previous GI evaluation:  12/12/2016 colonoscopy for hematochezia-fair/poor prep.  Left-sided diverticulosis.  Past Medical History:  Diagnosis Date  . Acute CVA (cerebrovascular accident) ([place]) 07/25/2015  . Asthma   . Benign essential HTN   . Cerebral infarction due to embolism of left middle cerebral artery ([place]) 02/03/2014  . Chronic combined systolic and diastolic CHF (congestive heart failure) ([place]) 12/23/2017  . Colitis   . Colon polyps   . Diabetes mellitus type 2, diet-controlled ([place]) 12/22/2013  . Diverticulosis   . GERD (gastroesophageal reflux disease)   . GI bleed   . Hemorrhoids   . Hiatal hernia   . History of esophageal strciture   . HLD (hyperlipidemia)   . Osteoarthritis   . Proctitis   . Status post dilation of esophageal narrowing   . Stroke [facility])     Past Surgical History:  Procedure Laterality Date  . ABDOMINAL HERNIA REPAIR    . COLONOSCOPY     multiple  . COLONOSCOPY WITH PROPOFOL  N/A 12/12/2016   Procedure: COLONOSCOPY WITH PROPOFOL;  Surgeon: Gatha Mayer, MD;  Location: [facility] ENDOSCOPY;  Service: Endoscopy;  Laterality: N/A;  . ESOPHAGOGASTRODUODENOSCOPY     multiple  . INSERTION OF MESH  01/29/2012   Procedure: INSERTION OF MESH;  Surgeon: Gwenyth Ober, MD;  Location: [place];  Service: General;  Laterality: N/A;  . NISSEN FUNDOPLICATION    . SPLENECTOMY, TOTAL     nontraumatic rupture  . VENTRAL HERNIA REPAIR  01/29/2012    WITH MESH  . VENTRAL HERNIA REPAIR  01/29/2012   Procedure: HERNIA REPAIR VENTRAL ADULT;  Surgeon: Gwenyth Ober, MD;  Location: [place];  Service: General;  Laterality: N/A;  open recurrent ventral hernia repair with mesh    Prior to Admission medications   Medication Sig Start  Date End Date Taking? Authorizing Provider  albuterol (PROAIR HFA) 108 (90 Base) MCG/ACT inhaler Inhale 2 puffs into the lungs every 6 (six) hours as needed for wheezing or shortness of breath.    [provider]  albuterol (PROVENTIL) (2.5 MG/3ML) 0.083% nebulizer solution Take 3 mLs by nebulization every 6 (six) hours as needed for wheezing. 10/04/18   [provider]  budesonide (PULMICORT) 0.5 MG/2ML nebulizer solution Take 2 mLs by nebulization 2 (two) times daily. 09/01/18   [provider]  carvedilol (COREG) 3.125 MG tablet Take 1 tablet (3.125 mg total) by mouth 2 (two) times daily with a meal. 02/26/16   Amin, Jeanella Flattery, MD  clopidogrel (PLAVIX) 75 MG tablet Take 75 mg by mouth daily. 07/05/18   [provider]  co-enzyme Q-10 30 MG capsule Take 7 capsules (210 mg total) by mouth daily. Patient not taking: Reported on 12/09/2018 09/28/18   Garvin Fila, MD  diphenhydramine-acetaminophen (TYLENOL PM) 25-500 MG TABS tablet Take 2 tablets by mouth at bedtime as needed (sleep).    [provider]  ferrous sulfate 325 (65 FE) MG tablet Take 1 tablet (325 mg total) by mouth 2 (two) times daily with a meal. Patient taking differently: Take 325 mg by mouth daily.  02/07/18   Thurnell Lose, MD  fluticasone (FLONASE) 50 MCG/ACT nasal spray Place 1 spray into both nostrils every morning.  06/18/14   [provider]  Melatonin 10 MG TABS Take 1 tablet by mouth at bedtime.    [provider]  metFORMIN (GLUCOPHAGE) 1000 MG tablet Take 1,000 mg by mouth daily with breakfast.    [provider]  montelukast (SINGULAIR) 10 MG tablet Take 10 mg by mouth at bedtime.    [provider]  polyethylene glycol (MIRALAX / GLYCOLAX) packet Take 17 g by mouth daily.    [provider]  psyllium (METAMUCIL) 58.6 % powder Take 1 packet by mouth every morning.     [provider]  rOPINIRole (REQUIP) 0.5 MG tablet  Take 2 mg by mouth at bedtime.     [provider]  simvastatin (ZOCOR) 40 MG tablet Take 1 tablet (40 mg total) by mouth daily. Patient taking differently: Take 40 mg by mouth at bedtime.  02/26/16   Amin, Jeanella Flattery, MD  zolpidem (AMBIEN) 10 MG tablet Take 5 mg by mouth at bedtime as needed for sleep.    [provider]    Current Facility-Administered Medications  Medication Dose Route Frequency Provider Last Rate Last Admin  . 0.9 %  sodium chloride infusion  250 mL Intravenous PRN Jani Gravel, MD      . 0.9 %  sodium chloride infusion   Intravenous Continuous Jani Gravel, MD      . acetaminophen (TYLENOL) tablet 650 mg  650 mg Oral Q6H PRN Jani Gravel, MD       Or  . acetaminophen (TYLENOL) suppository 650 mg  650 mg Rectal Q6H PRN Jani Gravel, MD      . acetaminophen (TYLENOL) tablet 1,000 mg  1,000 mg Oral QHS PRN Jani Gravel, MD       And  . diphenhydrAMINE (BENADRYL) capsule 50 mg  50 mg Oral QHS PRN Jani Gravel, MD      . albuterol (PROVENTIL) (2.5 MG/3ML) 0.083% nebulizer solution 3 mL  3 mL Inhalation Q6H PRN Jani Gravel, MD      . budesonide (PULMICORT) nebulizer solution 0.5 mg  2 mL Nebulization BID Jani Gravel, MD      . carvedilol (COREG) tablet 3.125 mg  3.125 mg Oral BID WC Jani Gravel, MD   3.125 mg at 04/01/19 0849  . fluticasone (FLONASE) 50 MCG/ACT nasal spray 1 spray  1 spray Each Nare Daily Jani Gravel, MD      . insulin aspart (novoLOG) injection 0-9 Units  0-9 Units Subcutaneous Q4H Jani Gravel, MD   2 Units at 04/01/19 [phone]  . Melatonin TABS 9 mg  9 mg Oral QHS Jani Gravel, MD      . montelukast (SINGULAIR) tablet 10 mg  10 mg Oral QHS Jani Gravel, MD      . rOPINIRole (REQUIP) tablet 2 mg  2 mg Oral QHS Jani Gravel, MD      . simvastatin (ZOCOR) tablet 40 mg  40 mg Oral QHS Jani Gravel, MD      . sodium chloride flush (NS) 0.9 % injection 3 mL  3 mL Intravenous Q12H Jani Gravel, MD   3 mL at 04/01/19 0405  . sodium chloride flush (NS) 0.9 % injection 3 mL  3  mL Intravenous PRN Jani Gravel, MD      . zolpidem Lorrin Mais) tablet 5 mg  5 mg Oral QHS PRN Jani Gravel, MD        Allergies as of 03/31/2019 - Review Complete 03/31/2019  Allergen Reaction Noted  . Tape Other (See Comments) 02/04/2018  . Azithromycin Rash and Other (See Comments) 03/08/2015    Family History  Problem Relation Age of Onset  . Alzheimer's disease Mother   . Breast cancer Mother   . Throat cancer Father   . Colon cancer Neg Hx   . Colon polyps Neg Hx   . Pancreatic cancer Neg Hx   . Stomach cancer Neg Hx   . Liver disease Neg Hx     Social History   Socioeconomic History  . Marital status: Married    Spouse name: [name]  . Number of children: 2  . Years of education: Bachelor's  . Highest education level: Not on file  Occupational History  . Occupation: retired  Tobacco Use  . Smoking status: Never Smoker  . Smokeless tobacco: Never Used  Substance and Sexual Activity  . Alcohol use: Yes    Alcohol/week: 2.0 standard drinks    Types: 2 Glasses of wine per week    Comment: daily  . Drug use: No  . Sexual activity: Not on file  Other Topics Concern  . Not on file  Social History Narrative   Patient is married and has 2 children.   Patient is right handed.   Patient has Bachelor's degree.   Patient drinks 2 cups daily.  Social Determinants of Health   Financial Resource Strain:   . Difficulty of Paying Living Expenses: Not on file  Food Insecurity:   . Worried About Charity fundraiser in the Last Year: Not on file  . Ran Out of Food in the Last Year: Not on file  Transportation Needs:   . Lack of Transportation (Medical): Not on file  . Lack of Transportation (Non-Medical): Not on file  Physical Activity:   . Days of Exercise per Week: Not on file  . Minutes of Exercise per Session: Not on file  Stress:   . Feeling of Stress : Not on file  Social Connections:   . Frequency of Communication with Friends and Family: Not on file  . Frequency  of Social Gatherings with Friends and Family: Not on file  . Attends Religious Services: Not on file  . Active Member of Clubs or Organizations: Not on file  . Attends Archivist Meetings: Not on file  . Marital Status: Not on file  Intimate Partner Violence:   . Fear of Current or Ex-Partner: Not on file  . Emotionally Abused: Not on file  . Physically Abused: Not on file  . Sexually Abused: Not on file    Review of Systems: All systems reviewed and negative except where noted in HPI.  Physical Exam: Vital signs in last 24 hours: Temp:  [98.2 F (36.8 C)-98.5 F (36.9 C)] 98.4 F (36.9 C) (02/13 0643) Pulse Rate:  [82-101] 93 (02/13 0643) Resp:  [16-24] 18 (02/13 0643) BP: (93-132)/(60-96) 93/67 (02/13 0643) SpO2:  [94 %-100 %] 96 % (02/13 0643) Weight:  [69.9 kg-72.1 kg] 69.9 kg (02/13 0155) Last BM Date: 04/01/19 General:   Alert, well-developed,  male in NAD Psych:  Pleasant, cooperative. Normal mood and affect. Eyes:  Pupils equal, sclera clear, no icterus.   Conjunctiva pink. Ears:  Normal auditory acuity. Nose:  No deformity, discharge,  or lesions. Neck:  Supple; no masses Lungs:  Clear throughout to auscultation.   No wheezes, crackles, or rhonchi.  Heart:  Regular rate and rhythm, no lower extremity edema Abdomen:  Soft, non-distended, nontender, BS active, no palp mass   Rectal:  On bedpan, large amount of dark red blood in pan  Msk:  Symmetrical without gross deformities. . Neurologic:  Alert and  oriented x4;  grossly normal neurologically. Skin:  Intact without significant lesions or rashes.   Intake/Output from previous day: 02/12 0701 - 02/13 0700 In: 0  Out: 600 [Urine:600] Intake/Output this shift: Total I/O In: -  Out: 250 [Stool:250]  Lab Results: Recent Labs    03/31/19 2252  WBC 13.1*  HGB 8.2*  HCT 27.3*  PLT 439*   BMET Recent Labs    03/31/19 2252 04/01/19 0521  NA 137 136  K 4.5 4.2  CL 107 102  CO2 23 26    GLUCOSE 152* 157*  BUN 16 18  CREATININE 0.85 0.80  CALCIUM 8.1* 7.6*   LFT Recent Labs    04/01/19 0521  PROT 4.5*  ALBUMIN 2.5*  AST 13*  ALT 10  ALKPHOS 39  BILITOT 0.6   PT/INR No results for input(s): LABPROT, INR in the last 72 hours. Hepatitis Panel No results for input(s): HEPBSAG, HCVAB, HEPAIGM, HEPBIGM in the last 72 hours.   . CBC Latest Ref Rng & Units 03/31/2019 12/09/2018 12/09/2018  WBC 4.0 - 10.5 K/uL 13.1(H) 12.5(H) 9.8  Hemoglobin 13.0 - 17.0 g/dL 8.2(L) 12.8(L) 14.0  Hematocrit 39.0 -  52.0 % 27.3(L) 41.8 45.5  Platelets 150 - 400 K/uL 439(H) 351 364    . CMP Latest Ref Rng & Units 04/01/2019 03/31/2019 12/09/2018  Glucose 70 - 99 mg/dL 157(H) 152(H) 142(H)  BUN 8 - 23 mg/dL 18 16 16   Creatinine 0.61 - 1.24 mg/dL 0.80 0.85 0.72  Sodium 135 - 145 mmol/L 136 137 138  Potassium 3.5 - 5.1 mmol/L 4.2 4.5 4.0  Chloride 98 - 111 mmol/L 102 107 104  CO2 22 - 32 mmol/L 26 23 27   Calcium 8.9 - 10.3 mg/dL 7.6(L) 8.1(L) 8.6(L)  Total Protein 6.5 - 8.1 g/dL 4.5(L) 5.3(L) 5.9(L)  Total Bilirubin 0.3 - 1.2 mg/dL 0.6 0.4 0.3  Alkaline Phos 38 - 126 U/L 39 47 62  AST 15 - 41 U/L 13(L) 15 15  ALT 0 - 44 U/L 10 11 11     Covid negative  Studies/Results: CT HEAD WO CONTRAST  Result Date: 04/01/2019 CLINICAL DATA:  Syncope EXAM: CT HEAD WITHOUT CONTRAST TECHNIQUE: Contiguous axial images were obtained from the base of the skull through the vertex without intravenous contrast. COMPARISON:  04/23/2018 FINDINGS: Brain: Generalized atrophy. Extensive chronic small-vessel ischemic changes of the cerebral hemispheric white matter. Old infarctions in the right lateral thalamus, posterior basal ganglia and radiating white matter tracts. No sign of acute infarction, mass lesion, hemorrhage, hydrocephalus or extra-axial collection. Vascular: There is atherosclerotic calcification of the major vessels at the base of the brain. Skull: Negative Sinuses/Orbits: Chronic mucosal  inflammatory changes of the paranasal sinuses. Orbits negative. Other: None IMPRESSION: No acute intracranial finding. Extensive chronic ischemic changes throughout the brain, similar to 1 year ago. Chronic sinus mucosal inflammatory disease. Electronically Signed   By: Nelson Chimes M.D.   On: 04/01/2019 05:15   CT Angio Abd/Pel w/ and/or w/o  Result Date: 04/01/2019 CLINICAL DATA:  Melena. Rectal bleeding. EXAM: CTA ABDOMEN AND PELVIS WITHOUT AND WITH CONTRAST TECHNIQUE: Multidetector CT imaging of the abdomen and pelvis was performed using the standard protocol during bolus administration of intravenous contrast. Multiplanar reconstructed images and MIPs were obtained and reviewed to evaluate the vascular anatomy. CONTRAST:  145mL OMNIPAQUE IOHEXOL 350 MG/ML SOLN COMPARISON:  12/09/2018 FINDINGS: VASCULAR Aorta: Aortic atherosclerosis. No aneurysm, dissection, vasculitis or significant stenosis. Celiac: Patent without evidence of aneurysm, dissection, vasculitis or significant stenosis. SMA: Patent without evidence of aneurysm, dissection, vasculitis or significant stenosis. Renals: Both renal arteries are patent without evidence of aneurysm, dissection, vasculitis, fibromuscular dysplasia or significant stenosis. IMA: Patent without evidence of aneurysm, dissection, vasculitis or significant stenosis. Inflow: Patent without evidence of aneurysm, dissection, vasculitis or significant stenosis. Proximal Outflow: Bilateral common femoral and visualized portions of the superficial and profunda femoral arteries are patent without evidence of aneurysm, dissection, vasculitis or significant stenosis. Veins: No obvious venous abnormality within the limitations of this arterial phase study. Review of the MIP images confirms the above findings. NON-VASCULAR Lower chest: No acute abnormality. Stable 4 mm perifissural nodule in the right middle lobe, image 11/8. Likely benign. Hepatobiliary: No focal liver abnormality is  seen. No gallstones, gallbladder wall thickening, or biliary dilatation. Pancreas: No main duct dilatation, inflammation, or mass. Spleen: Status post splenectomy. Adrenals/Urinary Tract: Adrenal glands are normal. Bilateral kidney cysts are noted. No hydronephrosis. Urinary bladder unremarkable. Stomach/Bowel: Hiatal hernia. Stomach is otherwise nondistended. Moderate stool burden identified within the colon. Extensive left sided colonic diverticulosis. No evidence of active contrast extravasation to indicate site of hemorrhage. Lymphatic: No abdominopelvic adenopathy. Reproductive: Prostate is unremarkable. Other: No abdominal  wall hernia or abnormality. No abdominopelvic ascites. Musculoskeletal: Mild scoliosis and degenerative disc disease. Bilateral facet hypertrophy and degenerative change noted within the lumbar spine. IMPRESSION: VASCULAR 1. No evidence of contrast extravasation within the bowel to suggest an active GI bleed 2. Aortic atherosclerosis NON-VASCULAR 1. Diverticulosis 2. Hiatal hernia Aortic Atherosclerosis (ICD10-I70.0). Electronically Signed   By: Kerby Moors M.D.   On: 04/01/2019 05:27    Principal Problem:   Rectal bleeding Active Problems:   Diabetes mellitus type II, non insulin dependent ([place])   Seizures ([place])   Rectal bleed   Protein-calorie malnutrition, severe ([place])    Tye Savoy, NP-C @  04/01/2019, 9:15 AM    I have reviewed the entire case in detail with the above APP and discussed the plan in detail.  Therefore, I agree with the diagnoses recorded above. In addition,  I have personally interviewed and examined the patient and have personally reviewed any abdominal/pelvic CT scan images.  My additional thoughts are as follows:  Hematochezia, suspected to be recurrent diverticular bleeding in a patient on Plavix.  Similar episode several months ago, had colonoscopy at that time.  Still has ongoing bleeding at this point, as he was continuing to ooze dark  red blood into the bedpan when I evaluated him.  Fortunately, he is not hemodynamically compromised thus far.  His initial hemoglobin was 8.2, he did receive 2 units of PRBCs, and his posttransfusion hemoglobin that has just been resulted is 8.0.  There is clearly been ongoing bleeding overnight or perhaps even now as well.  CT angiogram early a.m. did not catch any bleeding.  I have ordered a tagged RBC scan, in hopes that perhaps a longer bleeding-I suggested this might be able to catch intermittent bleeding and thus allow angiographic intervention.  We are also contending with the continuing effect of Plavix, last dose of which he took yesterday.  We will keep him n.p.o. for now, IV fluid support.  No plans for repeat colonoscopy at present given high clinical suspicion for this being diverticular in nature, and colonoscopy performed in same clinical setting several months ago.  We will follow him closely.  [name] [phone]

## 2019-04-01 NOTE — H&P (Signed)
TRH H&P    Patient Demographics:    [name], is a 86 y.o. male  MRN: [id number]  DOB - [birth year]  Admit Date - 03/31/2019  Referring MD/NP/PA:   Delora Fuel  Outpatient Primary MD for the patient is Seward Carol, MD  Patient coming from:  Home , independent living at [facility]  Chief complaint- rectal bleeding   HPI:    [name]  is a 86 y.o. male,  w asthma, hyperlipidemia, Dm2, CHF (EF 45%), h/o stroke, Jerrye Bushy, diverticulosis, c/o rectal bleeding starting about 7:30pm , in toilet bowel and also on toilet paper.  Pt noted a 2nd episode of rectal bleeding later and after stood up had brief episode of syncope. Pt denies presyncopal symptoms.  Pt states 1 episode of rectal bleeding in the past few weeks but not as bad as last nite.  Pt denies any nsaids use.  Pt does take plavix. Pt notes drinking some wine as well.  Pt denies fever, chills, cough, cp, palp, sob, n/v, abd pain, diarrhea, black stool.     In ED,  T 98.5, P 96, R 22, Bp 122/74  Pox 98% on Ra Wt 72.1kg   Wbc 13.1, hgb 8.2 (prev 12.8), plt 439 Na 137, K 4.5, Bun 16, Creatinine 0.85 Alb 3.0 Ast 15, Alt 11   ekg nsr at 90, LAD, poor R progression, no st-t changes c/w ischemia.   ED spoke with GI ([place]), and per ED they recommended CTA abd/ pelvis and if there is a source of bleeding, IR consult Per ED, they will consult in the AM.   Pt will be admitted for rectal bleeding.      Review of systems:    In addition to the HPI above,  No Fever-chills, No Headache, No changes with Vision or hearing, No problems swallowing food or Liquids, No Chest pain, Cough or Shortness of Breath, No Abdominal pain, No Nausea or Vomiting, bowel movements are regular, No Blood in   Urine, No dysuria, No new skin rashes or bruises, No new joints pains-aches,  No new weakness, tingling, numbness in any extremity, No recent weight gain or  loss, No polyuria, polydypsia or polyphagia, No significant Mental Stressors.  All other systems reviewed and are negative.    Past History of the following :    Past Medical History:  Diagnosis Date  . Acute CVA (cerebrovascular accident) ([place]) 07/25/2015  . Asthma   . Benign essential HTN   . Cerebral infarction due to embolism of left middle cerebral artery ([place]) 02/03/2014  . Chronic combined systolic and diastolic CHF (congestive heart failure) ([place]) 12/23/2017  . Colitis   . Colon polyps   . Diabetes mellitus type 2, diet-controlled ([place]) 12/22/2013  . Diverticulosis   . GERD (gastroesophageal reflux disease)   . GI bleed   . Hemorrhoids   . Hiatal hernia   . History of esophageal strciture   . HLD (hyperlipidemia)   . Osteoarthritis   . Proctitis   . Status post dilation of esophageal narrowing   .  Stroke [facility])       Past Surgical History:  Procedure Laterality Date  . ABDOMINAL HERNIA REPAIR    . COLONOSCOPY     multiple  . COLONOSCOPY WITH PROPOFOL N/A 12/12/2016   Procedure: COLONOSCOPY WITH PROPOFOL;  Surgeon: Gatha Mayer, MD;  Location: [facility];  [name]: Endoscopy;  Laterality: N/A;  . ESOPHAGOGASTRODUODENOSCOPY     multiple  . INSERTION OF MESH  01/29/2012   Procedure: INSERTION OF MESH;  Surgeon: Gwenyth Ober, MD;  Location: [place];  [name]: General;  Laterality: N/A;  . NISSEN FUNDOPLICATION    . SPLENECTOMY, TOTAL     nontraumatic rupture  . VENTRAL HERNIA REPAIR  01/29/2012    WITH MESH  . VENTRAL HERNIA REPAIR  01/29/2012   Procedure: HERNIA REPAIR VENTRAL ADULT;  Surgeon: Gwenyth Ober, MD;  Location: [place];  [name]: General;  Laterality: N/A;  open recurrent ventral hernia repair with mesh      Social History:      Social History   Tobacco Use  . Smoking status: Never Smoker  . Smokeless tobacco: Never Used  Substance Use Topics  . Alcohol use: Yes    Alcohol/week: 2.0 standard drinks    Types: 2 Glasses of wine per week     Comment: daily       Family History :     Family History  Problem Relation Age of Onset  . Alzheimer's disease Mother   . Breast cancer Mother   . Throat cancer Father   . Colon cancer Neg Hx   . Colon polyps Neg Hx   . Pancreatic cancer Neg Hx   . Stomach cancer Neg Hx   . Liver disease Neg Hx        Home Medications:   Prior to Admission medications   Medication Sig Start Date End Date Taking? Authorizing Provider  albuterol (PROAIR HFA) 108 (90 Base) MCG/ACT inhaler Inhale 2 puffs into the lungs every 6 (six) hours as needed for wheezing or shortness of breath.    [provider]  albuterol (PROVENTIL) (2.5 MG/3ML) 0.083% nebulizer solution Take 3 mLs by nebulization every 6 (six) hours as needed for wheezing. 10/04/18   [provider]  budesonide (PULMICORT) 0.5 MG/2ML nebulizer solution Take 2 mLs by nebulization 2 (two) times daily. 09/01/18   [provider]  carvedilol (COREG) 3.125 MG tablet Take 1 tablet (3.125 mg total) by mouth 2 (two) times daily with a meal. 02/26/16   Amin, Jeanella Flattery, MD  clopidogrel (PLAVIX) 75 MG tablet Take 75 mg by mouth daily. 07/05/18   [provider]  co-enzyme Q-10 30 MG capsule Take 7 capsules (210 mg total) by mouth daily. Patient not taking: Reported on 12/09/2018 09/28/18   Garvin Fila, MD  diphenhydramine-acetaminophen (TYLENOL PM) 25-500 MG TABS tablet Take 2 tablets by mouth at bedtime as needed (sleep).    [provider]  ferrous sulfate 325 (65 FE) MG tablet Take 1 tablet (325 mg total) by mouth 2 (two) times daily with a meal. Patient taking differently: Take 325 mg by mouth daily.  02/07/18   Thurnell Lose, MD  fluticasone (FLONASE) 50 MCG/ACT nasal spray Place 1 spray into both nostrils every morning.  06/18/14   [provider]  Melatonin 10 MG TABS Take 1 tablet by mouth at bedtime.    [provider]  metFORMIN (GLUCOPHAGE) 1000 MG tablet Take 1,000 mg by  mouth daily with breakfast.  [provider]  montelukast (SINGULAIR) 10 MG tablet Take 10 mg by mouth at bedtime.    [provider]  polyethylene glycol (MIRALAX / GLYCOLAX) packet Take 17 g by mouth daily.    [provider]  psyllium (METAMUCIL) 58.6 % powder Take 1 packet by mouth every morning.     [provider]  rOPINIRole (REQUIP) 0.5 MG tablet Take 2 mg by mouth at bedtime.     [provider]  simvastatin (ZOCOR) 40 MG tablet Take 1 tablet (40 mg total) by mouth daily. Patient taking differently: Take 40 mg by mouth at bedtime.  02/26/16   Amin, Jeanella Flattery, MD  zolpidem (AMBIEN) 10 MG tablet Take 5 mg by mouth at bedtime as needed for sleep.    [provider]     Allergies:     Allergies  Allergen Reactions  . Tape Other (See Comments)    SKIN IS SENSITIVE; PLEASE USE PAPER TAPE; SKIN BRUISES AND TEARS EASILY!!  . Azithromycin Rash and Other (See Comments)    Pt had a ZPak Jan 2020 - no reaction     Physical Exam:   Vitals  Blood pressure (!) 118/96, pulse (!) 101, temperature 98.5 F (36.9 C), resp. rate (!) 24, height 5\' 10"  (1.778 m), weight 72.1 kg, SpO2 98 %.  1.  General: axoxo3  2. Psychiatric: nonfocal  3. Neurologic: nonfocal    4. HEENMT:  Anicteric, pale conjuctiva, pupils 1.61mm symmetric, direct, consensual intact Neck: no jvd  5. Respiratory : CTAB  6. Cardiovascular : rrr s1, s2, no m/g/r  7. Gastrointestinal:  Abd: soft, nt, nd, +bs, + umblical hernia  8. Skin:  Ext: no c/c/e, no rash  9.Musculoskeletal:  Good ROM    Data Review:    CBC Recent Labs  Lab 03/31/19 2252  WBC 13.1*  HGB 8.2*  HCT 27.3*  PLT 439*  MCV 87.5  MCH 26.3  MCHC 30.0  RDW 18.0*  LYMPHSABS 1.3  MONOABS 1.1*  EOSABS 0.3  BASOSABS 0.1   ------------------------------------------------------------------------------------------------------------------  Results for orders placed or  performed during the hospital encounter of 03/31/19 (from the past 48 hour(s))  POC occult blood, ED     Status: Abnormal   Collection Time: 03/31/19 10:38 PM  Result Value Ref Range   Fecal Occult Bld POSITIVE (A) NEGATIVE  CBC with Differential/Platelet     Status: Abnormal   Collection Time: 03/31/19 10:52 PM  Result Value Ref Range   WBC 13.1 (H) 4.0 - 10.5 K/uL   RBC 3.12 (L) 4.22 - 5.81 MIL/uL   Hemoglobin 8.2 (L) 13.0 - 17.0 g/dL   HCT 27.3 (L) 39.0 - 52.0 %   MCV 87.5 80.0 - 100.0 fL   MCH 26.3 26.0 - 34.0 pg   MCHC 30.0 30.0 - 36.0 g/dL   RDW 18.0 (H) 11.5 - 15.5 %   Platelets 439 (H) 150 - 400 K/uL   nRBC 0.0 0.0 - 0.2 %   Neutrophils Relative % 78 %   Neutro Abs 10.2 (H) 1.7 - 7.7 K/uL   Lymphocytes Relative 10 %   Lymphs Abs 1.3 0.7 - 4.0 K/uL   Monocytes Relative 8 %   Monocytes Absolute 1.1 (H) 0.1 - 1.0 K/uL   Eosinophils Relative 2 %   Eosinophils Absolute 0.3 0.0 - 0.5 K/uL   Basophils Relative 1 %   Basophils Absolute 0.1 0.0 - 0.1 K/uL   Immature Granulocytes 1 %   Abs Immature Granulocytes  0.08 (H) 0.00 - 0.07 K/uL    Comment: Performed at [facility], [place] [address] [place], [place] [address]  Comprehensive metabolic panel     Status: Abnormal   Collection Time: 03/31/19 10:52 PM  Result Value Ref Range   Sodium 137 135 - 145 mmol/L   Potassium 4.5 3.5 - 5.1 mmol/L   Chloride 107 98 - 111 mmol/L   CO2 23 22 - 32 mmol/L   Glucose, Bld 152 (H) 70 - 99 mg/dL   BUN 16 8 - 23 mg/dL   Creatinine, Ser 0.85 0.61 - 1.24 mg/dL   Calcium 8.1 (L) 8.9 - 10.3 mg/dL   Total Protein 5.3 (L) 6.5 - 8.1 g/dL   Albumin 3.0 (L) 3.5 - 5.0 g/dL   AST 15 15 - 41 U/L   ALT 11 0 - 44 U/L   Alkaline Phosphatase 47 38 - 126 U/L   Total Bilirubin 0.4 0.3 - 1.2 mg/dL   GFR calc non Af Amer >60 >60 mL/min   GFR calc Af Amer >60 >60 mL/min   Anion gap 7 5 - 15    Comment: Performed at [facility] [address] [place], [place] [address]  Type and screen      Status: None (Preliminary result)   Collection Time: 03/31/19 10:54 PM  Result Value Ref Range   ABO/RH(D) O POS    Antibody Screen NEG    Sample Expiration 04/03/2019,2359    Unit Number [id number]    Blood Component Type RED CELLS,LR    Unit division 00    Status of Unit ISSUED    Transfusion Status OK TO TRANSFUSE    Crossmatch Result      Compatible Performed at [facility], [place] [address], [place], [place] [address]    Unit Number [id number]    Blood Component Type RED CELLS,LR    Unit division 00    Status of Unit ALLOCATED    Transfusion Status OK TO TRANSFUSE    Crossmatch Result Compatible   CBG monitoring, ED     Status: Abnormal   Collection Time: 03/31/19 10:57 PM  Result Value Ref Range   Glucose-Capillary 137 (H) 70 - 99 mg/dL  Prepare RBC     Status: None   Collection Time: 03/31/19 11:29 PM  Result Value Ref Range   Order Confirmation      ORDER PROCESSED BY BLOOD BANK Performed at [facility], [place] [address] [place], [place] [address]     Chemistries  Recent Labs  Lab 03/31/19 2252  NA 137  K 4.5  CL 107  CO2 23  GLUCOSE 152*  BUN 16  CREATININE 0.85  CALCIUM 8.1*  AST 15  ALT 11  ALKPHOS 47  BILITOT 0.4   ------------------------------------------------------------------------------------------------------------------  ------------------------------------------------------------------------------------------------------------------ GFR: Estimated Creatinine Clearance: 64.8 mL/min (by C-G formula based on SCr of 0.85 mg/dL). Liver Function Tests: Recent Labs  Lab 03/31/19 2252  AST 15  ALT 11  ALKPHOS 47  BILITOT 0.4  PROT 5.3*  ALBUMIN 3.0*   No results for input(s): LIPASE, AMYLASE in the last 168 hours. No results for input(s): AMMONIA in the last 168 hours. Coagulation Profile: No results for input(s): INR, PROTIME in the last 168 hours. Cardiac Enzymes: No results for input(s): CKTOTAL, CKMB, CKMBINDEX,  TROPONINI in the last 168 hours. BNP (last 3 results) No results for input(s): PROBNP in the last 8760 hours. HbA1C: No results for input(s): HGBA1C in  the last 72 hours. CBG: Recent Labs  Lab 03/31/19 2257  GLUCAP 137*   Lipid Profile: No results for input(s): CHOL, HDL, LDLCALC, TRIG, CHOLHDL, LDLDIRECT in the last 72 hours. Thyroid Function Tests: No results for input(s): TSH, T4TOTAL, FREET4, T3FREE, THYROIDAB in the last 72 hours. Anemia Panel: No results for input(s): VITAMINB12, FOLATE, FERRITIN, TIBC, IRON, RETICCTPCT in the last 72 hours.  --------------------------------------------------------------------------------------------------------------- Urine analysis:    Component Value Date/Time   COLORURINE STRAW (A) 04/21/2018 2320   APPEARANCEUR CLEAR 04/21/2018 2320   LABSPEC 1.013 04/21/2018 2320   PHURINE 6.0 04/21/2018 2320   GLUCOSEU NEGATIVE 04/21/2018 2320   HGBUR NEGATIVE 04/21/2018 2320   BILIRUBINUR NEGATIVE 04/21/2018 2320   KETONESUR NEGATIVE 04/21/2018 2320   PROTEINUR NEGATIVE 04/21/2018 2320   UROBILINOGEN 0.2 12/22/2013 1210   NITRITE NEGATIVE 04/21/2018 2320   LEUKOCYTESUR NEGATIVE 04/21/2018 2320      Imaging Results:    No results found.     Assessment & Plan:    Principal Problem:   Rectal bleeding Active Problems:   Diabetes mellitus type II, non insulin dependent (HCC)   Seizures (HCC)   Rectal bleed   Protein-calorie malnutrition, severe (HCC)  Rectal bleeding NPO CTA abd/ pelvis, please if there is source of bleeding seen please consult IR to evaluate Transfuse 2 units prbc Check cbc in am GI consulted by ED, appreciate input  Syncope Tele Check CT brain  Trop I   H/o Stroke Hold Plavix for now due to rectal bleeding Cont Simvastatin 40mg  po qhs  Hx of CHF Lasix 20mg  iv x1 Cont Carvedilol 3.125mg  po bid  H/o Iron deficiency anemia Restart ferrous sulfate on discharge Consider Injectafer if oral iron  insufficient  Dm2 Stop metformin fsbs q4h, ISS  Asthma Cont Pulmicort 1 neb bid Cont Albuterol HFA 2puff q6h prn   RLS Cont Requip  Insomnia Cont tylenol pm, if not working [employer] 5mg  po qhs prn   Severe protein calorie malnutrition Start prostat 30 mL po bid when able to take po   DVT Prophylaxis-   SCDs   AM Labs Ordered, also please review Full Orders  Family Communication: Admission, patients condition and plan of care including tests being ordered have been discussed with the patient  who indicate understanding and agree with the plan and Code Status.  Code Status:   DNR per patient,   Admission status: Observation: Based on patients clinical presentation and evaluation of above clinical data, I have made determination that patient meets observation criteria at this time.  Time spent in minutes : 70 minutes   Jani Gravel M.D on 04/01/2019 at 12:53 AM

## 2019-04-01 NOTE — ED Provider Notes (Signed)
Care assumed from Dr. Maryan Rued, patient with GI bleed pending metabolic panel.  Metabolic panel is unremarkable.  Case is discussed with Dr. Maudie Mercury of [facility] hospitalist, who agrees to admit the patient.    Delora Fuel, MD [phone]

## 2019-04-01 NOTE — Procedures (Signed)
Interventional Radiology Procedure Note  Procedure: Mesenteric arteriography  Complications: None  Estimated Blood Loss: < 10 mL  Findings: Celiac, SMA, right colic and IMA arteriography negative for bleed, arterial abnormality or AV malformation. Unable to locate abnormality to embolize.  Venetia Night. Kathlene Cote, M.D Pager:  [phone]

## 2019-04-01 NOTE — H&P (Signed)
Chief Complaint: Patient was seen in consultation today for GI bleed at the request of Dr. Eulogio Bear  Referring Physician(s): Eulogio Bear, DO  Patient Status: [facility] - In-pt  History of Present Illness: [name] is a 86 y.o. male with a history of prior diverticular bleeds presenting with significant rectal bleeding and syncopal episodes. On chronic Plavix. CTA earlier this AM negative for bleed. NM bleeding scan this afternoon positive for bleed in right abdomen in vicinity of right colon with transit on scan. Active bleeding today with Hgb down to 7.0. Receiving blood.   Past Medical History:  Diagnosis Date  . Acute CVA (cerebrovascular accident) ([place]) 07/25/2015  . Asthma   . Benign essential HTN   . Cerebral infarction due to embolism of left middle cerebral artery ([place]) 02/03/2014  . Chronic combined systolic and diastolic CHF (congestive heart failure) ([place]) 12/23/2017  . Colitis   . Colon polyps   . Diabetes mellitus type 2, diet-controlled ([place]) 12/22/2013  . Diverticulosis   . GERD (gastroesophageal reflux disease)   . GI bleed   . Hemorrhoids   . Hiatal hernia   . History of esophageal strciture   . HLD (hyperlipidemia)   . Osteoarthritis   . Proctitis   . Status post dilation of esophageal narrowing   . Stroke [facility])     Past Surgical History:  Procedure Laterality Date  . ABDOMINAL HERNIA REPAIR    . COLONOSCOPY     multiple  . COLONOSCOPY WITH PROPOFOL N/A 12/12/2016   Procedure: COLONOSCOPY WITH PROPOFOL;  Surgeon: Gatha Mayer, MD;  Location: [facility] ENDOSCOPY;  Service: Endoscopy;  Laterality: N/A;  . ESOPHAGOGASTRODUODENOSCOPY     multiple  . INSERTION OF MESH  01/29/2012   Procedure: INSERTION OF MESH;  Surgeon: Gwenyth Ober, MD;  Location: [place];  Service: General;  Laterality: N/A;  . NISSEN FUNDOPLICATION    . SPLENECTOMY, TOTAL     nontraumatic rupture  . VENTRAL HERNIA REPAIR  01/29/2012    WITH MESH  . VENTRAL HERNIA REPAIR  01/29/2012    Procedure: HERNIA REPAIR VENTRAL ADULT;  Surgeon: Gwenyth Ober, MD;  Location: [place];  Service: General;  Laterality: N/A;  open recurrent ventral hernia repair with mesh    Allergies: Tape and Azithromycin  Medications: Prior to Admission medications   Medication Sig Start Date End Date Taking? Authorizing Provider  albuterol (PROAIR HFA) 108 (90 Base) MCG/ACT inhaler Inhale 2 puffs into the lungs every 6 (six) hours as needed for wheezing or shortness of breath.   Yes [provider]  albuterol (PROVENTIL) (2.5 MG/3ML) 0.083% nebulizer solution Take 3 mLs by nebulization every 6 (six) hours as needed for wheezing or shortness of breath.  10/04/18  Yes [provider]  budesonide (PULMICORT) 0.5 MG/2ML nebulizer solution Take 2 mLs by nebulization 2 (two) times daily. 09/01/18  Yes [provider]  carvedilol (COREG) 3.125 MG tablet Take 1 tablet (3.125 mg total) by mouth 2 (two) times daily with a meal. 02/26/16  Yes Amin, Ankit Chirag, MD  clopidogrel (PLAVIX) 75 MG tablet Take 75 mg by mouth daily.  07/05/18  Yes [provider]  diphenhydramine-acetaminophen (TYLENOL PM) 25-500 MG TABS tablet Take 2 tablets by mouth at bedtime.    Yes [provider]  ferrous sulfate 325 (65 FE) MG tablet Take 1 tablet (325 mg total) by mouth 2 (two) times daily with a meal. Patient taking differently: Take 325 mg by mouth daily with  breakfast.  02/07/18  Yes Thurnell Lose, MD  fluticasone (FLONASE) 50 MCG/ACT nasal spray Place 1 spray into both nostrils daily as needed for allergies or rhinitis.  06/18/14  Yes [provider]  Melatonin 10 MG TABS Take 10 mg by mouth at bedtime as needed (for sleep).    Yes [provider]  metFORMIN (GLUCOPHAGE) 1000 MG tablet Take 1,000 mg by mouth daily after breakfast.    Yes [provider]  montelukast (SINGULAIR) 10 MG tablet Take 10 mg by mouth at bedtime as needed (for breathing flares).     Yes [provider]  polyethylene glycol (MIRALAX / GLYCOLAX) packet Take 17 g by mouth daily as needed for mild constipation ([place]).    Yes [provider]  predniSONE (DELTASONE) 10 MG tablet Take 10 mg by mouth daily.   Yes [provider]  psyllium (METAMUCIL) 58.6 % powder Take 1 packet by mouth daily as needed (for constipation- MIX AND DRINK).    Yes [provider]  rOPINIRole (REQUIP) 1 MG tablet Take 2 mg by mouth at bedtime. 02/18/19  Yes [provider]  simvastatin (ZOCOR) 40 MG tablet Take 1 tablet (40 mg total) by mouth daily. Patient taking differently: Take 40 mg by mouth at bedtime.  02/26/16  Yes Amin, Jeanella Flattery, MD  co-enzyme Q-10 30 MG capsule Take 7 capsules (210 mg total) by mouth daily. Patient not taking: Reported on 04/01/2019 09/28/18   Garvin Fila, MD     Family History  Problem Relation Age of Onset  . Alzheimer's disease Mother   . Breast cancer Mother   . Throat cancer Father   . Colon cancer Neg Hx   . Colon polyps Neg Hx   . Pancreatic cancer Neg Hx   . Stomach cancer Neg Hx   . Liver disease Neg Hx     Social History   Socioeconomic History  . Marital status: Married    Spouse name: [name]  . Number of children: 2  . Years of education: Bachelor's  . Highest education level: Not on file  Occupational History  . Occupation: retired  Tobacco Use  . Smoking status: Never Smoker  . Smokeless tobacco: Never Used  Substance and Sexual Activity  . Alcohol use: Yes    Alcohol/week: 2.0 standard drinks    Types: 2 Glasses of wine per week    Comment: daily  . Drug use: No  . Sexual activity: Not on file  Other Topics Concern  . Not on file  Social History Narrative   Patient is married and has 2 children.   Patient is right handed.   Patient has Bachelor's degree.   Patient drinks 2 cups daily.   Social Determinants of Health   Financial Resource Strain:   . Difficulty of Paying  Living Expenses: Not on file  Food Insecurity:   . Worried About Charity fundraiser in the Last Year: Not on file  . Ran Out of Food in the Last Year: Not on file  Transportation Needs:   . Lack of Transportation (Medical): Not on file  . Lack of Transportation (Non-Medical): Not on file  Physical Activity:   . Days of Exercise per Week: Not on file  . Minutes of Exercise per Session: Not on file  Stress:   . Feeling of Stress : Not on file  Social Connections:   . Frequency of Communication with Friends and Family: Not on file  .  Frequency of Social Gatherings with Friends and Family: Not on file  . Attends Religious Services: Not on file  . Active Member of Clubs or Organizations: Not on file  . Attends Archivist Meetings: Not on file  . Marital Status: Not on file    Review of Systems: A 12 point ROS discussed and pertinent positives are indicated in the HPI above.  All other systems are negative.  Review of Systems  Constitutional: Negative.   Respiratory: Negative.   Gastrointestinal: Positive for blood in stool. Negative for abdominal distention, abdominal pain, anal bleeding, diarrhea, nausea and vomiting.  Genitourinary: Negative.   Musculoskeletal: Negative.   Neurological: Positive for syncope.    Vital Signs: BP 106/66   Pulse 98   Temp 97.8 F (36.6 C) (Oral)   Resp 18   Ht 5\' 10"  (1.778 m)   Wt 69.9 kg   SpO2 97%   BMI 22.11 kg/m   Physical Exam Vitals reviewed.  Constitutional:      Appearance: He is ill-appearing.  Cardiovascular:     Rate and Rhythm: Normal rate and regular rhythm.     Heart sounds: No murmur. No gallop.   Pulmonary:     Effort: Pulmonary effort is normal.     Breath sounds: Normal breath sounds.  Abdominal:     Tenderness: There is no abdominal tenderness. There is no guarding or rebound.  Skin:    General: Skin is warm.     Coloration: Skin is pale.  Neurological:     General: No focal deficit present.      Mental Status: He is alert and oriented to person, place, and time.     Imaging: CT HEAD WO CONTRAST  Result Date: 04/01/2019 CLINICAL DATA:  Syncope EXAM: CT HEAD WITHOUT CONTRAST TECHNIQUE: Contiguous axial images were obtained from the base of the skull through the vertex without intravenous contrast. COMPARISON:  04/23/2018 FINDINGS: Brain: Generalized atrophy. Extensive chronic small-vessel ischemic changes of the cerebral hemispheric white matter. Old infarctions in the right lateral thalamus, posterior basal ganglia and radiating white matter tracts. No sign of acute infarction, mass lesion, hemorrhage, hydrocephalus or extra-axial collection. Vascular: There is atherosclerotic calcification of the major vessels at the base of the brain. Skull: Negative Sinuses/Orbits: Chronic mucosal inflammatory changes of the paranasal sinuses. Orbits negative. Other: None IMPRESSION: No acute intracranial finding. Extensive chronic ischemic changes throughout the brain, similar to 1 year ago. Chronic sinus mucosal inflammatory disease. Electronically Signed   By: Nelson Chimes M.D.   On: 04/01/2019 05:15   NM GI Blood Loss  Result Date: 04/01/2019 CLINICAL DATA:  Rectal bleeding with syncope EXAM: NUCLEAR MEDICINE GASTROINTESTINAL BLEEDING SCAN TECHNIQUE: Sequential abdominal images were obtained following intravenous administration of Tc-63m labeled red blood cells. RADIOPHARMACEUTICALS:  27.2 mCi Tc-59m pertechnetate in-vitro labeled red cells. COMPARISON:  CT angiography 04/01/2019, GI bleed study 04/22/2018 FINDINGS: Intense focus of activity in the right peripheral mid abdomen with progression of activity transversely across the upper abdomen and to the left upper quadrant. Based on CT, this mostly likely represents active bleeding within the right colon. Physiologic activity is present within the bladder. IMPRESSION: Positive for active GI bleeding, with right colon as the suspected source of GI bleeding.  These results will be called to the ordering clinician or representative by the Radiologist Assistant, and communication documented in the PACS or zVision Dashboard. Electronically Signed   By: Donavan Foil M.D.   On: 04/01/2019 16:17   CT Angio Abd/Pel  w/ and/or w/o  Result Date: 04/01/2019 CLINICAL DATA:  Melena. Rectal bleeding. EXAM: CTA ABDOMEN AND PELVIS WITHOUT AND WITH CONTRAST TECHNIQUE: Multidetector CT imaging of the abdomen and pelvis was performed using the standard protocol during bolus administration of intravenous contrast. Multiplanar reconstructed images and MIPs were obtained and reviewed to evaluate the vascular anatomy. CONTRAST:  185mL OMNIPAQUE IOHEXOL 350 MG/ML SOLN COMPARISON:  12/09/2018 FINDINGS: VASCULAR Aorta: Aortic atherosclerosis. No aneurysm, dissection, vasculitis or significant stenosis. Celiac: Patent without evidence of aneurysm, dissection, vasculitis or significant stenosis. SMA: Patent without evidence of aneurysm, dissection, vasculitis or significant stenosis. Renals: Both renal arteries are patent without evidence of aneurysm, dissection, vasculitis, fibromuscular dysplasia or significant stenosis. IMA: Patent without evidence of aneurysm, dissection, vasculitis or significant stenosis. Inflow: Patent without evidence of aneurysm, dissection, vasculitis or significant stenosis. Proximal Outflow: Bilateral common femoral and visualized portions of the superficial and profunda femoral arteries are patent without evidence of aneurysm, dissection, vasculitis or significant stenosis. Veins: No obvious venous abnormality within the limitations of this arterial phase study. Review of the MIP images confirms the above findings. NON-VASCULAR Lower chest: No acute abnormality. Stable 4 mm perifissural nodule in the right middle lobe, image 11/8. Likely benign. Hepatobiliary: No focal liver abnormality is seen. No gallstones, gallbladder wall thickening, or biliary dilatation.  Pancreas: No main duct dilatation, inflammation, or mass. Spleen: Status post splenectomy. Adrenals/Urinary Tract: Adrenal glands are normal. Bilateral kidney cysts are noted. No hydronephrosis. Urinary bladder unremarkable. Stomach/Bowel: Hiatal hernia. Stomach is otherwise nondistended. Moderate stool burden identified within the colon. Extensive left sided colonic diverticulosis. No evidence of active contrast extravasation to indicate site of hemorrhage. Lymphatic: No abdominopelvic adenopathy. Reproductive: Prostate is unremarkable. Other: No abdominal wall hernia or abnormality. No abdominopelvic ascites. Musculoskeletal: Mild scoliosis and degenerative disc disease. Bilateral facet hypertrophy and degenerative change noted within the lumbar spine. IMPRESSION: VASCULAR 1. No evidence of contrast extravasation within the bowel to suggest an active GI bleed 2. Aortic atherosclerosis NON-VASCULAR 1. Diverticulosis 2. Hiatal hernia Aortic Atherosclerosis (ICD10-I70.0). Electronically Signed   By: Kerby Moors M.D.   On: 04/01/2019 05:27    Labs:  CBC: Recent Labs    12/09/18 1612 03/31/19 2252 04/01/19 1114 04/01/19 1652  WBC 12.5* 13.1* 10.2 12.7*  HGB 12.8* 8.2* 8.0* 7.0*  HCT 41.8 27.3* 25.1* 22.5*  PLT 351 439* 313 306    COAGS: Recent Labs    04/21/18 2150 04/23/18 1834 08/21/18 1550  INR 1.0 1.0 1.0  APTT 27 26  --     BMP: Recent Labs    08/22/18 0920 12/09/18 1006 03/31/19 2252 04/01/19 0521  NA 136 138 137 136  K 3.8 4.0 4.5 4.2  CL 104 104 107 102  CO2 24 27 23 26   GLUCOSE 161* 142* 152* 157*  BUN 14 16 16 18   CALCIUM 8.0* 8.6* 8.1* 7.6*  CREATININE 0.82 0.72 0.85 0.80  GFRNONAA >60 >60 >60 >60  GFRAA >60 >60 >60 >60    LIVER FUNCTION TESTS: Recent Labs    08/22/18 0920 12/09/18 1006 03/31/19 2252 04/01/19 0521  BILITOT 0.6 0.3 0.4 0.6  AST 16 15 15  13*  ALT 10 11 11 10   ALKPHOS 48 62 47 39  PROT 4.8* 5.9* 5.3* 4.5*  ALBUMIN 2.7* 3.4* 3.0* 2.5*     Assessment and Plan:  Spoke with Mr. Younghans about proceeding with arteriography and possible embolization in setting of active lower GI bleed and positive bleeding scan. Also spoke with patient's wife by  phone. Risks and benefits of arteriography and possible embolization were discussed with the patient including, but not limited to bleeding, infection, vascular injury or contrast induced renal failure.  This interventional procedure involves the use of X-rays and because of the nature of the planned procedure, it is possible that we will have prolonged use of X-ray fluoroscopy.  Potential radiation risks to you include (but are not limited to) the following: - A slightly elevated risk for cancer  several years later in life. This risk is typically less than 0.5% percent. This risk is low in comparison to the normal incidence of human cancer, which is 33% for women and 50% for men according to the [place]. - Radiation induced injury can include skin redness, resembling a rash, tissue breakdown / ulcers and hair loss (which can be temporary or permanent).   The likelihood of either of these occurring depends on the difficulty of the procedure and whether you are sensitive to radiation due to previous procedures, disease, or genetic conditions.   IF your procedure requires a prolonged use of radiation, you will be notified and given written instructions for further action.  It is your responsibility to monitor the irradiated area for the 2 weeks following the procedure and to notify your physician if you are concerned that you have suffered a radiation induced injury.    All of the patient's questions were answered, patient is agreeable to proceed.  Consent signed and in chart.   Thank you for this interesting consult.  I greatly enjoyed [place] and look forward to participating in their care.  A copy of this report was sent to the requesting provider on this  date.  Electronically Signed: Azzie Roup, MD 04/01/2019, 6:22 PM     I spent a total of 20 Minutes in face to face in clinical consultation, greater than 50% of which was counseling/coordinating care for GI bleed and arteriography.

## 2019-04-01 NOTE — Progress Notes (Signed)
Patient placed in observation after midnight by Dr. Maudie Mercury, please see H&P.  Patient's care began in the ER prior to midnight.  Patient here with acute anemia from GI bleed.  Most likely bleed is diverticular as patient is on Plavix and has a history of diverticular bleeding.  GI consult appreciated.  Patient is status post 2 units of PRBC and I suspect he may need more as he is still passing dark red blood. Eulogio Bear DO

## 2019-04-01 NOTE — ED Notes (Signed)
During transport to floor IV pole caught an a transition and pulled out IV. Receiving nurse aware.

## 2019-04-01 NOTE — Sedation Documentation (Signed)
PRBcs completed at [employer]

## 2019-04-01 NOTE — Progress Notes (Signed)
New Admission Note:   Arrival Method:  stretcher Mental Orientation: alert x 4 Telemetry: box 14intact Assessment: Completed Skin: intact IV: NSL Pain:  none Tubes: NONE Safety Measures: Safety Fall Prevention Plan has been discussed Admission: Completed [facility] Orientation: Patient has been orientated to the room, unit and staff.  Family: none at bedside  Orders have been reviewed and implemented. Will continue to monitor the patient. Call light has been placed within reach and bed alarm has been activated.   Rockie Neighbours BSN, RN Phone number: [phone]

## 2019-04-01 NOTE — Progress Notes (Signed)
Called by NM with a positive bleeding scan.  Spoke with Dr. Loletha Carrow as well as DR. Yamagada of IR.  Updated patient at bedside.  Patient is strict NPO.  CBC being repeated now and will plan to transfuse 2 more units of blood as he is still having blood pass per nursing.  Will also transfer to progressive care for tonight.  Wife called, updated and all questions answered. Eulogio Bear DO

## 2019-04-01 NOTE — Sedation Documentation (Signed)
Right groin sheath removed. 5 Fr. Exoseal applied

## 2019-04-01 NOTE — Progress Notes (Addendum)
Patient currently in IR for procedure, gave report to 2W nurse and answered all her questions.  Made nurse aware that pt started 1 unit of blood and should give Lasix after another unit is transfused and need to call the blood bank ahead if patient is actively bleeding to prepare for another 2 units as per order.  Notified the wife regarding the procedure and transfer to 2W. Took all his belongings to 2W.

## 2019-04-02 LAB — BASIC METABOLIC PANEL
Anion gap: 10 (ref 5–15)
BUN: 33 mg/dL — ABNORMAL HIGH (ref 8–23)
CO2: 27 mmol/L (ref 22–32)
Calcium: 8.1 mg/dL — ABNORMAL LOW (ref 8.9–10.3)
Chloride: 104 mmol/L (ref 98–111)
Creatinine, Ser: 0.8 mg/dL (ref 0.61–1.24)
GFR calc Af Amer: >60 mL/min (ref >60)
GFR calc non Af Amer: >60 mL/min (ref >60)
Glucose, Bld: 147 mg/dL — ABNORMAL HIGH (ref 70–99)
Potassium: 3.8 mmol/L (ref 3.5–5.1)
Sodium: 141 mmol/L (ref 135–145)

## 2019-04-02 LAB — CBC
HCT: 24.2 % — ABNORMAL LOW (ref 39.0–52.0)
Hemoglobin: 7.6 g/dL — ABNORMAL LOW (ref 13.0–17.0)
MCH: 27.1 pg (ref 26.0–34.0)
MCHC: 31.4 g/dL (ref 30.0–36.0)
MCV: 86.4 fL (ref 80.0–100.0)
Platelets: 284 10*3/uL (ref 150–400)
RBC: 2.8 MIL/uL — ABNORMAL LOW (ref 4.22–5.81)
RDW: 16.2 % — ABNORMAL HIGH (ref 11.5–15.5)
WBC: 15.6 10*3/uL — ABNORMAL HIGH (ref 4.0–10.5)
nRBC: 0.1 % (ref 0.0–0.2)

## 2019-04-02 LAB — GLUCOSE, CAPILLARY
Glucose-Capillary: 119 mg/dL — ABNORMAL HIGH (ref 70–99)
Glucose-Capillary: 138 mg/dL — ABNORMAL HIGH (ref 70–99)
Glucose-Capillary: 151 mg/dL — ABNORMAL HIGH (ref 70–99)
Glucose-Capillary: 171 mg/dL — ABNORMAL HIGH (ref 70–99)
Glucose-Capillary: 179 mg/dL — ABNORMAL HIGH (ref 70–99)
Glucose-Capillary: 74 mg/dL (ref 70–99)

## 2019-04-02 MED ORDER — SODIUM CHLORIDE 0.9 % IV SOLN: INTRAVENOUS | Status: DC

## 2019-04-02 MED ORDER — SODIUM CHLORIDE 0.9 % IV BOLUS
250.0000 mL | Freq: Once | INTRAVENOUS | Status: AC
  Administered 2019-04-02: 250 mL via INTRAVENOUS

## 2019-04-02 NOTE — Progress Notes (Signed)
PROGRESS NOTE    [name]  [id number] DOB: [birth year] DOA: 03/31/2019 PCP: Seward Carol, MD     Brief Narrative:  [name] is an 86 y.o. male with history of asthma, hyperlipidemia, DM 2, CHF (EF 45%), h/o stroke, GERD, diverticulosis, who presented with chief complaint of rectal bleeding starting about 7:30pm, in toilet bowl and also on toilet paper.  Pt noted a second episode of rectal bleeding later and after stood up had brief episode of syncope. Pt denies presyncopal symptoms. Pt denies any NSAID use. Pt does take plavix.  New events last 24 hours / Subjective: Patient slightly confused, easily reoriented.  He is in bilateral hand mittens, apparently was pulling at IVs.  He is able to tell me why he is in the hospital but thought he was at [facility].  Denies any further rectal bleeding since yesterday to his knowledge.  Feeling well overall without any acute complaints.  Denies any nausea, vomiting, abdominal pain.   Assessment & Plan:   Principal Problem:   Rectal bleeding Active Problems:   Diabetes mellitus type II, non insulin dependent (HCC)   Seizures (HCC)   Rectal bleed   Protein-calorie malnutrition, severe (HCC)   DNR (do not resuscitate)   Rectal bleeding with acute blood loss anemia, symptomatic anemia presenting with syncope -Thought to be diverticular in etiology -CTA abdomen pelvis 2/13: No evidence of contrast extravasation within the bowel -Tagged RBC scan 2/13: Positive for active GI bleeding, with right colon and suspected source of GI bleeding -Status post mesenteric arteriography by IR 2/13: Celiac, SMA, right colic and IMA arteriography negative for bleed, arterial abnormality or AV malformation. Unable to locate abnormality to embolize. -Transfused total 3 unit packed red blood cells so far -GI following -Hemoglobin 7.6 this morning  History of stroke -Hold Plavix -Continue simvastatin  Chronic systolic and diastolic  CHF -Echocardiogram in 2018 with EF [id number], grade 1 diastolic dysfunction -Without clinical exacerbation and actually appears dry on exam. IVF started  -Continue Coreg  Type 2 diabetes -Hold Metformin while inpatient -Sliding scale insulin  Restless leg syndrome -Continue Requip    DVT prophylaxis: SCD Code Status: DNR Family Communication: None at bedside Disposition Plan:  . Patient is from home, independent living at [facility] prior to admission. . Currently in-hospital treatment needed due to GI bleeding and symptomatic anemia. . Suspect patient will discharge to independent living facility in 1 to 2 days pending GI clearance for discharge and clinical stabilization.    Consultants:   GI  IR   Antimicrobials:  Anti-infectives (From admission, onward)   None        Objective: Vitals:   04/02/19 0401 04/02/19 0709 04/02/19 0754 04/02/19 0800  BP: 136/74   129/79  Pulse: 95   90  Resp: 15   17  Temp: 97.9 F (36.6 C) 98.3 F (36.8 C)    TempSrc: Oral Axillary    SpO2: 97%  99% 98%  Weight: 74.8 kg     Height:        Intake/Output Summary (Last 24 hours) at 04/02/2019 1029 Last data filed at 04/01/2019 2300 Gross per 24 hour  Intake 0 ml  Output 1476 ml  Net -1476 ml   Filed Weights   03/31/19 2237 04/01/19 0155 04/02/19 0401  Weight: 72.1 kg 69.9 kg 74.8 kg    Examination:  General exam: Appears calm and comfortable  Respiratory system: Clear to auscultation. Respiratory effort normal. No respiratory distress. No conversational  dyspnea.  Cardiovascular system: S1 & S2 heard, RRR. No murmurs. No pedal edema. Gastrointestinal system: Abdomen is nondistended, soft and nontender. Normal bowel sounds heard. Central nervous system: Alert and oriented. No focal neurological deficits. Speech clear.  Extremities: Symmetric in appearance  Skin: No rashes, lesions or ulcers on exposed skin  Psychiatry: Slightly confused but easily reoriented   Data  Reviewed: I have personally reviewed following labs and imaging studies  CBC: Recent Labs  Lab 03/31/19 2252 04/01/19 1114 04/01/19 1652 04/02/19 0327  WBC 13.1* 10.2 12.7* 15.6*  NEUTROABS 10.2*  --   --   --   HGB 8.2* 8.0* 7.0* 7.6*  HCT 27.3* 25.1* 22.5* 24.2*  MCV 87.5 85.1 85.9 86.4  PLT 439* 313 306 [id number]   Basic Metabolic Panel: Recent Labs  Lab 03/31/19 2252 04/01/19 0521 04/02/19 0327  NA 137 136 141  K 4.5 4.2 3.8  CL 107 102 104  CO2 23 26 27   GLUCOSE 152* 157* 147*  BUN 16 18 33*  CREATININE 0.85 0.80 0.80  CALCIUM 8.1* 7.6* 8.1*   GFR: Estimated Creatinine Clearance: 69.7 mL/min (by C-G formula based on SCr of 0.8 mg/dL). Liver Function Tests: Recent Labs  Lab 03/31/19 2252 04/01/19 0521  AST 15 13*  ALT 11 10  ALKPHOS 47 39  BILITOT 0.4 0.6  PROT 5.3* 4.5*  ALBUMIN 3.0* 2.5*   No results for input(s): LIPASE, AMYLASE in the last 168 hours. No results for input(s): AMMONIA in the last 168 hours. Coagulation Profile: No results for input(s): INR, PROTIME in the last 168 hours. Cardiac Enzymes: No results for input(s): CKTOTAL, CKMB, CKMBINDEX, TROPONINI in the last 168 hours. BNP (last 3 results) No results for input(s): PROBNP in the last 8760 hours. HbA1C: No results for input(s): HGBA1C in the last 72 hours. CBG: Recent Labs  Lab 04/01/19 1704 04/01/19 2002 04/02/19 0004 04/02/19 0354 04/02/19 0744  GLUCAP 141* 124* 151* 138* 119*   Lipid Profile: No results for input(s): CHOL, HDL, LDLCALC, TRIG, CHOLHDL, LDLDIRECT in the last 72 hours. Thyroid Function Tests: No results for input(s): TSH, T4TOTAL, FREET4, T3FREE, THYROIDAB in the last 72 hours. Anemia Panel: No results for input(s): VITAMINB12, FOLATE, FERRITIN, TIBC, IRON, RETICCTPCT in the last 72 hours. Sepsis Labs: No results for input(s): PROCALCITON, LATICACIDVEN in the last 168 hours.  Recent Results (from the past 240 hour(s))  SARS CORONAVIRUS 2 (TAT 6-24 HRS)  Nasopharyngeal Nasopharyngeal Swab     Status: None   Collection Time: 04/01/19 12:46 AM   Specimen: Nasopharyngeal Swab  Result Value Ref Range Status   SARS Coronavirus 2 NEGATIVE NEGATIVE Final    Comment: (NOTE) SARS-CoV-2 target nucleic acids are NOT DETECTED. The SARS-CoV-2 RNA is generally detectable in upper and lower respiratory specimens during the acute phase of infection. Negative results do not preclude SARS-CoV-2 infection, do not rule out co-infections with other pathogens, and should not be used as the sole basis for treatment or other patient management decisions. Negative results must be combined with clinical observations, patient history, and epidemiological information. The expected result is Negative. Fact Sheet for Patients: SugarRoll.be Fact Sheet for Healthcare Providers: https://www.woods-mathews.com/ This test is not yet approved or cleared by the [place] FDA and  has been authorized for detection and/or diagnosis of SARS-CoV-2 by FDA under an Emergency Use Authorization (EUA). This EUA will remain  in effect (meaning this test can be used) for the duration of the COVID-19 declaration under Section 56 4(b)(1) of the  Act, 21 U.S.C. section 360bbb-3(b)(1), unless the authorization is terminated or revoked sooner. Performed at [facility], [place] [address] [place], [place] [address]       Radiology Studies: CT HEAD WO CONTRAST  Result Date: 04/01/2019 CLINICAL DATA:  Syncope EXAM: CT HEAD WITHOUT CONTRAST TECHNIQUE: Contiguous axial images were obtained from the base of the skull through the vertex without intravenous contrast. COMPARISON:  04/23/2018 FINDINGS: Brain: Generalized atrophy. Extensive chronic small-vessel ischemic changes of the cerebral hemispheric white matter. Old infarctions in the right lateral thalamus, posterior basal ganglia and radiating white matter tracts. No sign of acute  infarction, mass lesion, hemorrhage, hydrocephalus or extra-axial collection. Vascular: There is atherosclerotic calcification of the major vessels at the base of the brain. Skull: Negative Sinuses/Orbits: Chronic mucosal inflammatory changes of the paranasal sinuses. Orbits negative. Other: None IMPRESSION: No acute intracranial finding. Extensive chronic ischemic changes throughout the brain, similar to 1 year ago. Chronic sinus mucosal inflammatory disease. Electronically Signed   By: Nelson Chimes M.D.   On: 04/01/2019 05:15   NM GI Blood Loss  Result Date: 04/01/2019 CLINICAL DATA:  Rectal bleeding with syncope EXAM: NUCLEAR MEDICINE GASTROINTESTINAL BLEEDING SCAN TECHNIQUE: Sequential abdominal images were obtained following intravenous administration of Tc-18m labeled red blood cells. RADIOPHARMACEUTICALS:  27.2 mCi Tc-13m pertechnetate in-vitro labeled red cells. COMPARISON:  CT angiography 04/01/2019, GI bleed study 04/22/2018 FINDINGS: Intense focus of activity in the right peripheral mid abdomen with progression of activity transversely across the upper abdomen and to the left upper quadrant. Based on CT, this mostly likely represents active bleeding within the right colon. Physiologic activity is present within the bladder. IMPRESSION: Positive for active GI bleeding, with right colon as the suspected source of GI bleeding. These results will be called to the ordering clinician or representative by the Radiologist Assistant, and communication documented in the PACS or zVision Dashboard. Electronically Signed   By: Donavan Foil M.D.   On: 04/01/2019 16:17   CT Angio Abd/Pel w/ and/or w/o  Result Date: 04/01/2019 CLINICAL DATA:  Melena. Rectal bleeding. EXAM: CTA ABDOMEN AND PELVIS WITHOUT AND WITH CONTRAST TECHNIQUE: Multidetector CT imaging of the abdomen and pelvis was performed using the standard protocol during bolus administration of intravenous contrast. Multiplanar reconstructed images and  MIPs were obtained and reviewed to evaluate the vascular anatomy. CONTRAST:  173mL OMNIPAQUE IOHEXOL 350 MG/ML SOLN COMPARISON:  12/09/2018 FINDINGS: VASCULAR Aorta: Aortic atherosclerosis. No aneurysm, dissection, vasculitis or significant stenosis. Celiac: Patent without evidence of aneurysm, dissection, vasculitis or significant stenosis. SMA: Patent without evidence of aneurysm, dissection, vasculitis or significant stenosis. Renals: Both renal arteries are patent without evidence of aneurysm, dissection, vasculitis, fibromuscular dysplasia or significant stenosis. IMA: Patent without evidence of aneurysm, dissection, vasculitis or significant stenosis. Inflow: Patent without evidence of aneurysm, dissection, vasculitis or significant stenosis. Proximal Outflow: Bilateral common femoral and visualized portions of the superficial and profunda femoral arteries are patent without evidence of aneurysm, dissection, vasculitis or significant stenosis. Veins: No obvious venous abnormality within the limitations of this arterial phase study. Review of the MIP images confirms the above findings. NON-VASCULAR Lower chest: No acute abnormality. Stable 4 mm perifissural nodule in the right middle lobe, image 11/8. Likely benign. Hepatobiliary: No focal liver abnormality is seen. No gallstones, gallbladder wall thickening, or biliary dilatation. Pancreas: No main duct dilatation, inflammation, or mass. Spleen: Status post splenectomy. Adrenals/Urinary Tract: Adrenal glands are normal. Bilateral kidney cysts are noted. No hydronephrosis. Urinary bladder unremarkable. Stomach/Bowel: Hiatal hernia. Stomach  is otherwise nondistended. Moderate stool burden identified within the colon. Extensive left sided colonic diverticulosis. No evidence of active contrast extravasation to indicate site of hemorrhage. Lymphatic: No abdominopelvic adenopathy. Reproductive: Prostate is unremarkable. Other: No abdominal wall hernia or  abnormality. No abdominopelvic ascites. Musculoskeletal: Mild scoliosis and degenerative disc disease. Bilateral facet hypertrophy and degenerative change noted within the lumbar spine. IMPRESSION: VASCULAR 1. No evidence of contrast extravasation within the bowel to suggest an active GI bleed 2. Aortic atherosclerosis NON-VASCULAR 1. Diverticulosis 2. Hiatal hernia Aortic Atherosclerosis (ICD10-I70.0). Electronically Signed   By: Kerby Moors M.D.   On: 04/01/2019 05:27      Scheduled Meds: . sodium chloride   Intravenous Once  . budesonide  2 mL Nebulization BID  . carvedilol  3.125 mg Oral BID WC  . fluticasone  1 spray Each Nare Daily  . insulin aspart  0-9 Units Subcutaneous Q4H  . Melatonin  9 mg Oral QHS  . montelukast  10 mg Oral QHS  . rOPINIRole  2 mg Oral QHS  . simvastatin  40 mg Oral QHS  . sodium chloride flush  3 mL Intravenous Q12H   Continuous Infusions: . sodium chloride    . sodium chloride 75 mL/hr at 04/02/19 1008     LOS: 1 day      Time spent: 35 minutes   Dessa Phi, DO [facility] 04/02/2019, 10:29 AM   Available via Epic secure chat 7am-7pm After these hours, please refer to coverage provider listed on [email or web address]

## 2019-04-02 NOTE — Progress Notes (Addendum)
Progress Note    ASSESSMENT AND PLAN:    86 year old male with PMH significant for hyperlipidemia, DM 2, CHF, CVA on Plavix, asthma, diverticulosis with history of diverticular bleed, remote Nissen fundoplication, splenectomy  GI bleed (hematochezia) / symptomatic anemia (of acute blood loss). Presumably diverticular hemorrhage on Plavix (has history of diverticular bleeding). CTA yesterday morning was negative. For subsequent bleeding got tagged RBC scan yesterday which was + for active right colon bleed but  mesenteric arteriography last evening was negative for bleed.  No bleeding this am --Hgb 8 yesterday, only at 7.6 this am following 3 units of blood.  --BUN up at 33 today following contrast. He has been NPO, not getting IVF and has dry mucosa. Will give 250 ml bolus NS and leave at 75 ml / hr     SUBJECTIVE    No complaints. In soft restraints, apparently was pulling at lines .  (That confusion seems to have passed by the time of attending evaluation, wife is also at bedside) Chief complaint: Right-sided colonic diverticular bleeding OBJECTIVE:     Vital signs in last 24 hours: Temp:  [97.4 F (36.3 C)-98.4 F (36.9 C)] 98.3 F (36.8 C) (02/14 0709) Pulse Rate:  [86-104] 95 (02/14 0401) Resp:  [15-26] 15 (02/14 0401) BP: (96-137)/(55-85) 136/74 (02/14 0401) SpO2:  [95 %-100 %] 99 % (02/14 0754) Weight:  [74.8 kg] 74.8 kg (02/14 0401) Last BM Date: 04/01/19 General:   Alert, NAD. Dry mucous membrane Heart:  Regular rate and rhythm;  No lower extremity edema   Pulm: Normal respiratory effort   Abdomen:  Soft, nondistended, nontender.  Normal bowel sounds.          Neurologic:  Alert and  oriented x4;  grossly normal neurologically. Psych:  Pleasant, cooperative.  Normal mood and affect.   Intake/Output from previous day: 02/13 0701 - 02/14 0700 In: 0  Out: 1726 [Urine:1375; Stool:351] Intake/Output this shift: No intake/output data recorded.  Lab  Results: Recent Labs    04/01/19 1114 04/01/19 1652 04/02/19 0327  WBC 10.2 12.7* 15.6*  HGB 8.0* 7.0* 7.6*  HCT 25.1* 22.5* 24.2*  PLT 313 306 284   BMET Recent Labs    03/31/19 2252 04/01/19 0521 04/02/19 0327  NA 137 136 141  K 4.5 4.2 3.8  CL 107 102 104  CO2 23 26 27   GLUCOSE 152* 157* 147*  BUN 16 18 33*  CREATININE 0.85 0.80 0.80  CALCIUM 8.1* 7.6* 8.1*   LFT Recent Labs    04/01/19 0521  PROT 4.5*  ALBUMIN 2.5*  AST 13*  ALT 10  ALKPHOS 39  BILITOT 0.6   PT/INR No results for input(s): LABPROT, INR in the last 72 hours. Hepatitis Panel No results for input(s): HEPBSAG, HCVAB, HEPAIGM, HEPBIGM in the last 72 hours.  CT HEAD WO CONTRAST  Result Date: 04/01/2019 CLINICAL DATA:  Syncope EXAM: CT HEAD WITHOUT CONTRAST TECHNIQUE: Contiguous axial images were obtained from the base of the skull through the vertex without intravenous contrast. COMPARISON:  04/23/2018 FINDINGS: Brain: Generalized atrophy. Extensive chronic small-vessel ischemic changes of the cerebral hemispheric white matter. Old infarctions in the right lateral thalamus, posterior basal ganglia and radiating white matter tracts. No sign of acute infarction, mass lesion, hemorrhage, hydrocephalus or extra-axial collection. Vascular: There is atherosclerotic calcification of the major vessels at the base of the brain. Skull: Negative Sinuses/Orbits: Chronic mucosal inflammatory changes of the paranasal sinuses. Orbits negative. Other: None IMPRESSION: No acute intracranial finding.  Extensive chronic ischemic changes throughout the brain, similar to 1 year ago. Chronic sinus mucosal inflammatory disease. Electronically Signed   By: Nelson Chimes M.D.   On: 04/01/2019 05:15   NM GI Blood Loss  Result Date: 04/01/2019 CLINICAL DATA:  Rectal bleeding with syncope EXAM: NUCLEAR MEDICINE GASTROINTESTINAL BLEEDING SCAN TECHNIQUE: Sequential abdominal images were obtained following intravenous administration of  Tc-40m labeled red blood cells. RADIOPHARMACEUTICALS:  27.2 mCi Tc-82m pertechnetate in-vitro labeled red cells. COMPARISON:  CT angiography 04/01/2019, GI bleed study 04/22/2018 FINDINGS: Intense focus of activity in the right peripheral mid abdomen with progression of activity transversely across the upper abdomen and to the left upper quadrant. Based on CT, this mostly likely represents active bleeding within the right colon. Physiologic activity is present within the bladder. IMPRESSION: Positive for active GI bleeding, with right colon as the suspected source of GI bleeding. These results will be called to the ordering clinician or representative by the Radiologist Assistant, and communication documented in the PACS or zVision Dashboard. Electronically Signed   By: Donavan Foil M.D.   On: 04/01/2019 16:17   CT Angio Abd/Pel w/ and/or w/o  Result Date: 04/01/2019 CLINICAL DATA:  Melena. Rectal bleeding. EXAM: CTA ABDOMEN AND PELVIS WITHOUT AND WITH CONTRAST TECHNIQUE: Multidetector CT imaging of the abdomen and pelvis was performed using the standard protocol during bolus administration of intravenous contrast. Multiplanar reconstructed images and MIPs were obtained and reviewed to evaluate the vascular anatomy. CONTRAST:  161mL OMNIPAQUE IOHEXOL 350 MG/ML SOLN COMPARISON:  12/09/2018 FINDINGS: VASCULAR Aorta: Aortic atherosclerosis. No aneurysm, dissection, vasculitis or significant stenosis. Celiac: Patent without evidence of aneurysm, dissection, vasculitis or significant stenosis. SMA: Patent without evidence of aneurysm, dissection, vasculitis or significant stenosis. Renals: Both renal arteries are patent without evidence of aneurysm, dissection, vasculitis, fibromuscular dysplasia or significant stenosis. IMA: Patent without evidence of aneurysm, dissection, vasculitis or significant stenosis. Inflow: Patent without evidence of aneurysm, dissection, vasculitis or significant stenosis. Proximal  Outflow: Bilateral common femoral and visualized portions of the superficial and profunda femoral arteries are patent without evidence of aneurysm, dissection, vasculitis or significant stenosis. Veins: No obvious venous abnormality within the limitations of this arterial phase study. Review of the MIP images confirms the above findings. NON-VASCULAR Lower chest: No acute abnormality. Stable 4 mm perifissural nodule in the right middle lobe, image 11/8. Likely benign. Hepatobiliary: No focal liver abnormality is seen. No gallstones, gallbladder wall thickening, or biliary dilatation. Pancreas: No main duct dilatation, inflammation, or mass. Spleen: Status post splenectomy. Adrenals/Urinary Tract: Adrenal glands are normal. Bilateral kidney cysts are noted. No hydronephrosis. Urinary bladder unremarkable. Stomach/Bowel: Hiatal hernia. Stomach is otherwise nondistended. Moderate stool burden identified within the colon. Extensive left sided colonic diverticulosis. No evidence of active contrast extravasation to indicate site of hemorrhage. Lymphatic: No abdominopelvic adenopathy. Reproductive: Prostate is unremarkable. Other: No abdominal wall hernia or abnormality. No abdominopelvic ascites. Musculoskeletal: Mild scoliosis and degenerative disc disease. Bilateral facet hypertrophy and degenerative change noted within the lumbar spine. IMPRESSION: VASCULAR 1. No evidence of contrast extravasation within the bowel to suggest an active GI bleed 2. Aortic atherosclerosis NON-VASCULAR 1. Diverticulosis 2. Hiatal hernia Aortic Atherosclerosis (ICD10-I70.0). Electronically Signed   By: Kerby Moors M.D.   On: 04/01/2019 05:27   Principal Problem:   Rectal bleeding Active Problems:   Diabetes mellitus type II, non insulin dependent (HCC)   Seizures (HCC)   Rectal bleed   Protein-calorie malnutrition, severe ([place])   DNR (do not resuscitate)  LOS: 1 day   Tye Savoy ,NP 04/02/2019, 8:37 AM   I have  discussed the case with the PA, and that is the plan I formulated. I personally interviewed and examined the patient.  Positive tagged RBC scan in right colon yesterday, but mesenteric angiogram did not find active bleeding.  Therefore, no intervention could be taken. No rebleeding for nearly 24 hours at this point.  Hemoglobin low but relatively stable.   Serial hemoglobin hematocrit I have advanced his diet and discontinued his IV fluids His wife asked about removing the Foley catheter, which seems like a good idea.  I sent a message to the hospital physician about it.  One of my partners will see him tomorrow.  Would like to see at least 48 hours of no overt bleeding before we would feel more comfortable sending him home, especially with the need to resume Plavix.   [name] Office: [phone]

## 2019-04-03 ENCOUNTER — Ambulatory Visit: Payer: Medicare HMO | Admitting: Adult Health

## 2019-04-03 LAB — CBC
HCT: 19.4 % — ABNORMAL LOW (ref 39.0–52.0)
Hemoglobin: 6.2 g/dL — CL (ref 13.0–17.0)
MCH: 27.8 pg (ref 26.0–34.0)
MCHC: 32 g/dL (ref 30.0–36.0)
MCV: 87 fL (ref 80.0–100.0)
Platelets: 248 10*3/uL (ref 150–400)
RBC: 2.23 MIL/uL — ABNORMAL LOW (ref 4.22–5.81)
RDW: 16.1 % — ABNORMAL HIGH (ref 11.5–15.5)
WBC: 9.9 10*3/uL (ref 4.0–10.5)
nRBC: 0 % (ref 0.0–0.2)

## 2019-04-03 LAB — BASIC METABOLIC PANEL
Anion gap: 7 (ref 5–15)
BUN: 20 mg/dL (ref 8–23)
CO2: 26 mmol/L (ref 22–32)
Calcium: 7.7 mg/dL — ABNORMAL LOW (ref 8.9–10.3)
Chloride: 106 mmol/L (ref 98–111)
Creatinine, Ser: 0.68 mg/dL (ref 0.61–1.24)
GFR calc Af Amer: >60 mL/min (ref >60)
GFR calc non Af Amer: >60 mL/min (ref >60)
Glucose, Bld: 106 mg/dL — ABNORMAL HIGH (ref 70–99)
Potassium: 3.4 mmol/L — ABNORMAL LOW (ref 3.5–5.1)
Sodium: 139 mmol/L (ref 135–145)

## 2019-04-03 LAB — HEMOGLOBIN AND HEMATOCRIT, BLOOD
HCT: 27.6 % — ABNORMAL LOW (ref 39.0–52.0)
Hemoglobin: 8.7 g/dL — ABNORMAL LOW (ref 13.0–17.0)

## 2019-04-03 LAB — GLUCOSE, CAPILLARY
Glucose-Capillary: 100 mg/dL — ABNORMAL HIGH (ref 70–99)
Glucose-Capillary: 105 mg/dL — ABNORMAL HIGH (ref 70–99)
Glucose-Capillary: 108 mg/dL — ABNORMAL HIGH (ref 70–99)
Glucose-Capillary: 157 mg/dL — ABNORMAL HIGH (ref 70–99)
Glucose-Capillary: 85 mg/dL (ref 70–99)
Glucose-Capillary: 98 mg/dL (ref 70–99)
Glucose-Capillary: 98 mg/dL (ref 70–99)

## 2019-04-03 LAB — PREPARE RBC (CROSSMATCH)

## 2019-04-03 MED ORDER — POTASSIUM CHLORIDE CRYS ER 20 MEQ PO TBCR
40.0000 meq | EXTENDED_RELEASE_TABLET | Freq: Once | ORAL | Status: AC
  Administered 2019-04-03: 40 meq via ORAL
  Filled 2019-04-03: qty 2

## 2019-04-03 MED ORDER — PEG-KCL-NACL-NASULF-NA ASC-C 100 G PO SOLR: 1.0000 | Freq: Two times a day (BID) | ORAL | Status: DC

## 2019-04-03 MED ORDER — SODIUM CHLORIDE 0.9% IV SOLUTION
Freq: Once | INTRAVENOUS | Status: AC
  Administered 2019-04-03: 10 mL/h via INTRAVENOUS

## 2019-04-03 MED ORDER — PEG-KCL-NACL-NASULF-NA ASC-C 100 G PO SOLR
0.5000 | Freq: Once | ORAL | Status: AC
  Administered 2019-04-04: 100 g via ORAL
  Filled 2019-04-03: qty 1

## 2019-04-03 MED ORDER — PEG-KCL-NACL-NASULF-NA ASC-C 100 G PO SOLR
0.5000 | Freq: Once | ORAL | Status: AC
  Administered 2019-04-03: 100 g via ORAL
  Filled 2019-04-03: qty 1

## 2019-04-03 NOTE — Progress Notes (Signed)
PROGRESS NOTE    [name]  LYY:[id number] DOB: 12-06-1931 DOA: 03/31/2019 PCP: Seward Carol, MD     Brief Narrative:  [name] is an 86 y.o. male with history of asthma, hyperlipidemia, DM 2, CHF (EF 45%), h/o stroke, GERD, diverticulosis, who presented with chief complaint of rectal bleeding starting about 7:30pm, in toilet bowl and also on toilet paper.  Pt noted a second episode of rectal bleeding later and after stood up had brief episode of syncope. Pt denies presyncopal symptoms. Pt denies any NSAID use. Pt does take plavix.  New events last 24 hours / Subjective: No complaints this morning, has not had a BM but Hgb trended downward and now getting blood transfusion. States that he is planned for a colonoscopy tomorrow.    Assessment & Plan:   Principal Problem:   Rectal bleeding Active Problems:   Diabetes mellitus type II, non insulin dependent (HCC)   Seizures (HCC)   Rectal bleed   Protein-calorie malnutrition, severe (HCC)   DNR (do not resuscitate)   Rectal bleeding with acute blood loss anemia, symptomatic anemia presenting with syncope -Thought to be diverticular in etiology -CTA abdomen pelvis 2/13: No evidence of contrast extravasation within the bowel -Tagged RBC scan 2/13: Positive for active GI bleeding, with right colon and suspected source of GI bleeding -Status post mesenteric arteriography by IR 2/13: Celiac, SMA, right colic and IMA arteriography negative for bleed, arterial abnormality or AV malformation. Unable to locate abnormality to embolize. -Transfused total 3 unit packed red blood cells so far -Hemoglobin 6.2 this morning, getting 2 additional transfusions today 2/15  -Plavix on hold -GI planning for colonoscopy tomorrow. General surgery consulted for possible colectomy option   History of stroke -Hold Plavix -Continue simvastatin  Chronic systolic and diastolic CHF -Echocardiogram in 2018 with EF 12%, grade 1 diastolic  dysfunction -Without clinical exacerbation  -Continue Coreg  Type 2 diabetes -Hold Metformin while inpatient -Sliding scale insulin  Restless leg syndrome -Continue Requip  Hypokalemia -Replace, trend     DVT prophylaxis: SCD Code Status: DNR Family Communication: None at bedside Disposition Plan:  . Patient is from home, independent living at [facility] prior to admission. . Currently in-hospital treatment needed due to GI bleeding and symptomatic anemia. . Suspect patient will discharge to independent living facility in 2-3 days pending stabilization of GI bleeding    Consultants:   GI  IR  General surgery    Antimicrobials:  Anti-infectives (From admission, onward)   None       Objective: Vitals:   04/03/19 0700 04/03/19 0721 04/03/19 0822 04/03/19 1000  BP:  116/71  118/70  Pulse: 85 85  71  Resp: _0 Temp: 98.6 F (37 C) 97.7 F (36.5 C)    TempSrc: Oral Oral    SpO2:  95% 96% 97%  Weight:      Height:        Intake/Output Summary (Last 24 hours) at 04/03/2019 1035 Last data filed at 04/03/2019 0500 Gross per 24 hour  Intake 251.07 ml  Output 725 ml  Net -473.93 ml   Filed Weights   04/01/19 0155 04/02/19 0401 04/03/19 0347  Weight: 69.9 kg 74.8 kg 74.8 kg    Examination: General exam: Appears calm and comfortable  Respiratory system: Clear to auscultation. Respiratory effort normal. Cardiovascular system: S1 & S2 heard, RRR. No pedal edema. Gastrointestinal system: Abdomen is nondistended, soft and nontender. Normal bowel sounds heard. Central nervous system: Alert  and oriented. Non focal exam. Speech clear  Extremities: Symmetric in appearance bilaterally  Skin: No rashes, lesions or ulcers on exposed skin  Psychiatry: Judgement and insight appear stable. Mood & affect appropriate.     Data Reviewed: I have personally reviewed following labs and imaging studies  CBC: Recent Labs  Lab 03/31/19 2252 04/01/19 1114  04/01/19 1652 04/02/19 0327 04/03/19 0345  WBC 13.1* 10.2 12.7* 15.6* 9.9  NEUTROABS 10.2*  --   --   --   --   HGB 8.2* 8.0* 7.0* 7.6* 6.2*  HCT 27.3* 25.1* 22.5* 24.2* 19.4*  MCV 87.5 85.1 85.9 86.4 87.0  PLT 439* 313 306 284 496   Basic Metabolic Panel: Recent Labs  Lab 03/31/19 2252 04/01/19 0521 04/02/19 0327 04/03/19 0345  NA 137 136 141 139  K 4.5 4.2 3.8 3.4*  CL 107 102 104 106  CO2 _0 GLUCOSE 152* 157* 147* 106*  BUN 16 18 33* 20  CREATININE 0.85 0.80 0.80 0.68  CALCIUM 8.1* 7.6* 8.1* 7.7*   GFR: Estimated Creatinine Clearance: 69.7 mL/min (by C-G formula based on SCr of 0.68 mg/dL). Liver Function Tests: Recent Labs  Lab 03/31/19 2252 04/01/19 0521  AST 15 13*  ALT 11 10  ALKPHOS 47 39  BILITOT 0.4 0.6  PROT 5.3* 4.5*  ALBUMIN 3.0* 2.5*   No results for input(s): LIPASE, AMYLASE in the last 168 hours. No results for input(s): AMMONIA in the last 168 hours. Coagulation Profile: No results for input(s): INR, PROTIME in the last 168 hours. Cardiac Enzymes: No results for input(s): CKTOTAL, CKMB, CKMBINDEX, TROPONINI in the last 168 hours. BNP (last 3 results) No results for input(s): PROBNP in the last 8760 hours. HbA1C: No results for input(s): HGBA1C in the last 72 hours. CBG: Recent Labs  Lab 04/02/19 1558 04/02/19 1953 04/02/19 2359 04/03/19 0344 04/03/19 0827  GLUCAP 74 171* 85 98 105*   Lipid Profile: No results for input(s): CHOL, HDL, LDLCALC, TRIG, CHOLHDL, LDLDIRECT in the last 72 hours. Thyroid Function Tests: No results for input(s): TSH, T4TOTAL, FREET4, T3FREE, THYROIDAB in the last 72 hours. Anemia Panel: No results for input(s): VITAMINB12, FOLATE, FERRITIN, TIBC, IRON, RETICCTPCT in the last 72 hours. Sepsis Labs: No results for input(s): PROCALCITON, LATICACIDVEN in the last 168 hours.  Recent Results (from the past 240 hour(s))  SARS CORONAVIRUS 2 (TAT 6-24 HRS) Nasopharyngeal Nasopharyngeal Swab     Status:  None   Collection Time: 04/01/19 12:46 AM   Specimen: Nasopharyngeal Swab  Result Value Ref Range Status   SARS Coronavirus 2 NEGATIVE NEGATIVE Final    Comment: (NOTE) SARS-CoV-2 target nucleic acids are NOT DETECTED. The SARS-CoV-2 RNA is generally detectable in upper and lower respiratory specimens during the acute phase of infection. Negative results do not preclude SARS-CoV-2 infection, do not rule out co-infections with other pathogens, and should not be used as the sole basis for treatment or other patient management decisions. Negative results must be combined with clinical observations, patient history, and epidemiological information. The expected result is Negative. Fact Sheet for Patients: SugarRoll.be Fact Sheet for Healthcare Providers: https://www.woods-mathews.com/ This test is not yet approved or cleared by the [place] FDA and  has been authorized for detection and/or diagnosis of SARS-CoV-2 by FDA under an Emergency Use Authorization (EUA). This EUA will remain  in effect (meaning this test can be used) for the duration of the COVID-19 declaration under Section 56 4(b)(1) of the Act, 21 U.S.C. section  360bbb-3(b)(1), unless the authorization is terminated or revoked sooner. Performed at [facility], [address], [place], [place] [address]       Radiology Studies: NM GI Blood Loss  Result Date: 04/01/2019 CLINICAL DATA:  Rectal bleeding with syncope EXAM: NUCLEAR MEDICINE GASTROINTESTINAL BLEEDING SCAN TECHNIQUE: Sequential abdominal images were obtained following intravenous administration of Tc-39mlabeled red blood cells. RADIOPHARMACEUTICALS:  27.2 mCi [id number] in-vitro labeled red cells. COMPARISON:  CT angiography 04/01/2019, GI bleed study 04/22/2018 FINDINGS: Intense focus of activity in the right peripheral mid abdomen with progression of activity transversely across the upper abdomen and  to the left upper quadrant. Based on CT, this mostly likely represents active bleeding within the right colon. Physiologic activity is present within the bladder. IMPRESSION: Positive for active GI bleeding, with right colon as the suspected source of GI bleeding. These results will be called to the ordering clinician or representative by the Radiologist Assistant, and communication documented in the PACS or zVision Dashboard. Electronically Signed   By: KiDonavan Foil.D.   On: 04/01/2019 16:17   IR Angiogram Visceral Selective  Result Date: 04/02/2019 INDICATION: Lower GI bleed with initial negative CT angiography. Due to continued bleeding, a bleeding scan was performed earlier this afternoon which was positive in the region of the right colon. Arteriography is now performed to determine whether there is a targetable bleed to treat with embolization. EXAM: 1. ULTRASOUND GUIDANCE FOR VASCULAR ACCESS OF THE RIGHT COMMON FEMORAL ARTERY 2. SELECTIVE VISCERAL ARTERIOGRAPHY OF THE CELIAC AXIS 3. SELECTIVE VISCERAL ARTERIOGRAPHY OF THE SUPERIOR MESENTERIC ARTERY 4. ADDITIONAL SELECTIVE ARTERIOGRAPHY OF THE RIGHT COLIC ARTERY 5. SELECTIVE VISCERAL ARTERIOGRAPHY OF THE INFERIOR MESENTERIC ARTERY MEDICATIONS: 100 mcg intra-arterial nitroglycerin administered into the superior mesenteric artery during the arteriogram. ANESTHESIA/SEDATION: Moderate (conscious) sedation was employed during this procedure. A total of Versed 2.0 mg and Fentanyl 75 mcg was administered intravenously. Moderate Sedation Time: 50 minutes. The patient's level of consciousness and vital signs were monitored continuously by radiology nursing throughout the procedure under my direct supervision. CONTRAST:  83 mL Omnipaque 300 FLUOROSCOPY TIME:  Fluoroscopy Time: 8 minutes and 24 seconds. 268 mGy. COMPLICATIONS: None immediate. PROCEDURE: Informed consent was obtained from the patient following explanation of the procedure, risks, benefits and  alternatives. The patient understands, agrees and consents for the procedure. All questions were addressed. A time out was performed prior to the initiation of the procedure. Maximal barrier sterile technique utilized including caps, mask, sterile gowns, sterile gloves, large sterile drape, hand hygiene, and chlorhexidine prep. Ultrasound was performed to confirm patency of the right common femoral artery. Under ultrasound guidance, access of the artery was performed utilizing a 21 gauge needle and micropuncture set. Ultrasound image documentation was performed. A 5 French sheath was placed over a guidewire. A 5 French Cobra catheter was advanced into the abdominal aorta. This was used to selectively catheterize the celiac axis. Selective celiac arteriography was performed. The Cobra catheter was then used to selectively catheterize the superior mesenteric artery. Selective arteriography was performed with injection of the main SMA trunk. Arteriography was performed in multiple projections including after administration of intra-arterial nitroglycerin. The Cobra catheter was then used to catheterize the right colic artery and sub selective right colic arteriography was performed. The catheter was then used to selectively catheterize the inferior mesenteric artery and selective arteriography performed with visualization of the entire IMA supply. After the procedure catheter and sheath were removed and hemostasis obtained utilizing the Cordis ExoSeal device.  FINDINGS: Celiac arteriography demonstrates normal branch vessel patency and anatomy with no evidence of arterial abnormality or bleeding source. SMA arteriography demonstrates normally patent artery supplying small bowel and the ascending and transverse colon without evidence of active bleeding, pseudoaneurysm, av malformation or vasculitis. Post nitroglycerin arteriography did not reveal any bleeding. Additional sub selective right colic arteriography  demonstrates no evidence of active bleeding or arterial abnormality. IMA arteriography demonstrates normal arterial supply to the descending colon, sigmoid colon and rectum without evidence of bleeding source, arterial abnormality or AV malformation. IMPRESSION: Unremarkable 3 vessel selective mesenteric arteriography and sub selective right colic arteriography demonstrating no active bleeding into the gastrointestinal tract, arterial abnormality or AV malformation. Electronically Signed   By: Aletta Edouard M.D.   On: 04/02/2019 10:53   IR Angiogram Visceral Selective  Result Date: 04/02/2019 INDICATION: Lower GI bleed with initial negative CT angiography. Due to continued bleeding, a bleeding scan was performed earlier this afternoon which was positive in the region of the right colon. Arteriography is now performed to determine whether there is a targetable bleed to treat with embolization. EXAM: 1. ULTRASOUND GUIDANCE FOR VASCULAR ACCESS OF THE RIGHT COMMON FEMORAL ARTERY 2. SELECTIVE VISCERAL ARTERIOGRAPHY OF THE CELIAC AXIS 3. SELECTIVE VISCERAL ARTERIOGRAPHY OF THE SUPERIOR MESENTERIC ARTERY 4. ADDITIONAL SELECTIVE ARTERIOGRAPHY OF THE RIGHT COLIC ARTERY 5. SELECTIVE VISCERAL ARTERIOGRAPHY OF THE INFERIOR MESENTERIC ARTERY MEDICATIONS: 100 mcg intra-arterial nitroglycerin administered into the superior mesenteric artery during the arteriogram. ANESTHESIA/SEDATION: Moderate (conscious) sedation was employed during this procedure. A total of Versed 2.0 mg and Fentanyl 75 mcg was administered intravenously. Moderate Sedation Time: 50 minutes. The patient's level of consciousness and vital signs were monitored continuously by radiology nursing throughout the procedure under my direct supervision. CONTRAST:  83 mL Omnipaque 300 FLUOROSCOPY TIME:  Fluoroscopy Time: 8 minutes and 24 seconds. 268 mGy. COMPLICATIONS: None immediate. PROCEDURE: Informed consent was obtained from the patient following explanation  of the procedure, risks, benefits and alternatives. The patient understands, agrees and consents for the procedure. All questions were addressed. A time out was performed prior to the initiation of the procedure. Maximal barrier sterile technique utilized including caps, mask, sterile gowns, sterile gloves, large sterile drape, hand hygiene, and chlorhexidine prep. Ultrasound was performed to confirm patency of the right common femoral artery. Under ultrasound guidance, access of the artery was performed utilizing a 21 gauge needle and micropuncture set. Ultrasound image documentation was performed. A 5 French sheath was placed over a guidewire. A 5 French Cobra catheter was advanced into the abdominal aorta. This was used to selectively catheterize the celiac axis. Selective celiac arteriography was performed. The Cobra catheter was then used to selectively catheterize the superior mesenteric artery. Selective arteriography was performed with injection of the main SMA trunk. Arteriography was performed in multiple projections including after administration of intra-arterial nitroglycerin. The Cobra catheter was then used to catheterize the right colic artery and sub selective right colic arteriography was performed. The catheter was then used to selectively catheterize the inferior mesenteric artery and selective arteriography performed with visualization of the entire IMA supply. After the procedure catheter and sheath were removed and hemostasis obtained utilizing the Cordis ExoSeal device. FINDINGS: Celiac arteriography demonstrates normal branch vessel patency and anatomy with no evidence of arterial abnormality or bleeding source. SMA arteriography demonstrates normally patent artery supplying small bowel and the ascending and transverse colon without evidence of active bleeding, pseudoaneurysm, av malformation or vasculitis. Post nitroglycerin arteriography did not reveal any bleeding. Additional sub  selective right colic arteriography demonstrates no evidence of active bleeding or arterial abnormality. IMA arteriography demonstrates normal arterial supply to the descending colon, sigmoid colon and rectum without evidence of bleeding source, arterial abnormality or AV malformation. IMPRESSION: Unremarkable 3 vessel selective mesenteric arteriography and sub selective right colic arteriography demonstrating no active bleeding into the gastrointestinal tract, arterial abnormality or AV malformation. Electronically Signed   By: Aletta Edouard M.D.   On: 04/02/2019 10:53   IR Angiogram Visceral Selective  Result Date: 04/02/2019 INDICATION: Lower GI bleed with initial negative CT angiography. Due to continued bleeding, a bleeding scan was performed earlier this afternoon which was positive in the region of the right colon. Arteriography is now performed to determine whether there is a targetable bleed to treat with embolization. EXAM: 1. ULTRASOUND GUIDANCE FOR VASCULAR ACCESS OF THE RIGHT COMMON FEMORAL ARTERY 2. SELECTIVE VISCERAL ARTERIOGRAPHY OF THE CELIAC AXIS 3. SELECTIVE VISCERAL ARTERIOGRAPHY OF THE SUPERIOR MESENTERIC ARTERY 4. ADDITIONAL SELECTIVE ARTERIOGRAPHY OF THE RIGHT COLIC ARTERY 5. SELECTIVE VISCERAL ARTERIOGRAPHY OF THE INFERIOR MESENTERIC ARTERY MEDICATIONS: 100 mcg intra-arterial nitroglycerin administered into the superior mesenteric artery during the arteriogram. ANESTHESIA/SEDATION: Moderate (conscious) sedation was employed during this procedure. A total of Versed 2.0 mg and Fentanyl 75 mcg was administered intravenously. Moderate Sedation Time: 50 minutes. The patient's level of consciousness and vital signs were monitored continuously by radiology nursing throughout the procedure under my direct supervision. CONTRAST:  83 mL Omnipaque 300 FLUOROSCOPY TIME:  Fluoroscopy Time: 8 minutes and 24 seconds. 268 mGy. COMPLICATIONS: None immediate. PROCEDURE: Informed consent was obtained from  the patient following explanation of the procedure, risks, benefits and alternatives. The patient understands, agrees and consents for the procedure. All questions were addressed. A time out was performed prior to the initiation of the procedure. Maximal barrier sterile technique utilized including caps, mask, sterile gowns, sterile gloves, large sterile drape, hand hygiene, and chlorhexidine prep. Ultrasound was performed to confirm patency of the right common femoral artery. Under ultrasound guidance, access of the artery was performed utilizing a 21 gauge needle and micropuncture set. Ultrasound image documentation was performed. A 5 French sheath was placed over a guidewire. A 5 French Cobra catheter was advanced into the abdominal aorta. This was used to selectively catheterize the celiac axis. Selective celiac arteriography was performed. The Cobra catheter was then used to selectively catheterize the superior mesenteric artery. Selective arteriography was performed with injection of the main SMA trunk. Arteriography was performed in multiple projections including after administration of intra-arterial nitroglycerin. The Cobra catheter was then used to catheterize the right colic artery and sub selective right colic arteriography was performed. The catheter was then used to selectively catheterize the inferior mesenteric artery and selective arteriography performed with visualization of the entire IMA supply. After the procedure catheter and sheath were removed and hemostasis obtained utilizing the Cordis ExoSeal device. FINDINGS: Celiac arteriography demonstrates normal branch vessel patency and anatomy with no evidence of arterial abnormality or bleeding source. SMA arteriography demonstrates normally patent artery supplying small bowel and the ascending and transverse colon without evidence of active bleeding, pseudoaneurysm, av malformation or vasculitis. Post nitroglycerin arteriography did not reveal any  bleeding. Additional sub selective right colic arteriography demonstrates no evidence of active bleeding or arterial abnormality. IMA arteriography demonstrates normal arterial supply to the descending colon, sigmoid colon and rectum without evidence of bleeding source, arterial abnormality or AV malformation. IMPRESSION: Unremarkable 3 vessel selective mesenteric arteriography and sub selective right colic arteriography demonstrating no active bleeding  into the gastrointestinal tract, arterial abnormality or AV malformation. Electronically Signed   By: Aletta Edouard M.D.   On: 04/02/2019 10:53   IR Angiogram Selective Each Additional Vessel  Result Date: 04/02/2019 INDICATION: Lower GI bleed with initial negative CT angiography. Due to continued bleeding, a bleeding scan was performed earlier this afternoon which was positive in the region of the right colon. Arteriography is now performed to determine whether there is a targetable bleed to treat with embolization. EXAM: 1. ULTRASOUND GUIDANCE FOR VASCULAR ACCESS OF THE RIGHT COMMON FEMORAL ARTERY 2. SELECTIVE VISCERAL ARTERIOGRAPHY OF THE CELIAC AXIS 3. SELECTIVE VISCERAL ARTERIOGRAPHY OF THE SUPERIOR MESENTERIC ARTERY 4. ADDITIONAL SELECTIVE ARTERIOGRAPHY OF THE RIGHT COLIC ARTERY 5. SELECTIVE VISCERAL ARTERIOGRAPHY OF THE INFERIOR MESENTERIC ARTERY MEDICATIONS: 100 mcg intra-arterial nitroglycerin administered into the superior mesenteric artery during the arteriogram. ANESTHESIA/SEDATION: Moderate (conscious) sedation was employed during this procedure. A total of Versed 2.0 mg and Fentanyl 75 mcg was administered intravenously. Moderate Sedation Time: 50 minutes. The patient's level of consciousness and vital signs were monitored continuously by radiology nursing throughout the procedure under my direct supervision. CONTRAST:  83 mL Omnipaque 300 FLUOROSCOPY TIME:  Fluoroscopy Time: 8 minutes and 24 seconds. 268 mGy. COMPLICATIONS: None immediate.  PROCEDURE: Informed consent was obtained from the patient following explanation of the procedure, risks, benefits and alternatives. The patient understands, agrees and consents for the procedure. All questions were addressed. A time out was performed prior to the initiation of the procedure. Maximal barrier sterile technique utilized including caps, mask, sterile gowns, sterile gloves, large sterile drape, hand hygiene, and chlorhexidine prep. Ultrasound was performed to confirm patency of the right common femoral artery. Under ultrasound guidance, access of the artery was performed utilizing a 21 gauge needle and micropuncture set. Ultrasound image documentation was performed. A 5 French sheath was placed over a guidewire. A 5 French Cobra catheter was advanced into the abdominal aorta. This was used to selectively catheterize the celiac axis. Selective celiac arteriography was performed. The Cobra catheter was then used to selectively catheterize the superior mesenteric artery. Selective arteriography was performed with injection of the main SMA trunk. Arteriography was performed in multiple projections including after administration of intra-arterial nitroglycerin. The Cobra catheter was then used to catheterize the right colic artery and sub selective right colic arteriography was performed. The catheter was then used to selectively catheterize the inferior mesenteric artery and selective arteriography performed with visualization of the entire IMA supply. After the procedure catheter and sheath were removed and hemostasis obtained utilizing the Cordis ExoSeal device. FINDINGS: Celiac arteriography demonstrates normal branch vessel patency and anatomy with no evidence of arterial abnormality or bleeding source. SMA arteriography demonstrates normally patent artery supplying small bowel and the ascending and transverse colon without evidence of active bleeding, pseudoaneurysm, av malformation or vasculitis. Post  nitroglycerin arteriography did not reveal any bleeding. Additional sub selective right colic arteriography demonstrates no evidence of active bleeding or arterial abnormality. IMA arteriography demonstrates normal arterial supply to the descending colon, sigmoid colon and rectum without evidence of bleeding source, arterial abnormality or AV malformation. IMPRESSION: Unremarkable 3 vessel selective mesenteric arteriography and sub selective right colic arteriography demonstrating no active bleeding into the gastrointestinal tract, arterial abnormality or AV malformation. Electronically Signed   By: Aletta Edouard M.D.   On: 04/02/2019 10:53   IR US Guide Vasc Access Right  Result Date: 04/02/2019 INDICATION: Lower GI bleed with initial negative CT angiography. Due to continued bleeding, a bleeding scan was  performed earlier this afternoon which was positive in the region of the right colon. Arteriography is now performed to determine whether there is a targetable bleed to treat with embolization. EXAM: 1. ULTRASOUND GUIDANCE FOR VASCULAR ACCESS OF THE RIGHT COMMON FEMORAL ARTERY 2. SELECTIVE VISCERAL ARTERIOGRAPHY OF THE CELIAC AXIS 3. SELECTIVE VISCERAL ARTERIOGRAPHY OF THE SUPERIOR MESENTERIC ARTERY 4. ADDITIONAL SELECTIVE ARTERIOGRAPHY OF THE RIGHT COLIC ARTERY 5. SELECTIVE VISCERAL ARTERIOGRAPHY OF THE INFERIOR MESENTERIC ARTERY MEDICATIONS: 100 mcg intra-arterial nitroglycerin administered into the superior mesenteric artery during the arteriogram. ANESTHESIA/SEDATION: Moderate (conscious) sedation was employed during this procedure. A total of Versed 2.0 mg and Fentanyl 75 mcg was administered intravenously. Moderate Sedation Time: 50 minutes. The patient's level of consciousness and vital signs were monitored continuously by radiology nursing throughout the procedure under my direct supervision. CONTRAST:  83 mL Omnipaque 300 FLUOROSCOPY TIME:  Fluoroscopy Time: 8 minutes and 24 seconds. 268 mGy.  COMPLICATIONS: None immediate. PROCEDURE: Informed consent was obtained from the patient following explanation of the procedure, risks, benefits and alternatives. The patient understands, agrees and consents for the procedure. All questions were addressed. A time out was performed prior to the initiation of the procedure. Maximal barrier sterile technique utilized including caps, mask, sterile gowns, sterile gloves, large sterile drape, hand hygiene, and chlorhexidine prep. Ultrasound was performed to confirm patency of the right common femoral artery. Under ultrasound guidance, access of the artery was performed utilizing a 21 gauge needle and micropuncture set. Ultrasound image documentation was performed. A 5 French sheath was placed over a guidewire. A 5 French Cobra catheter was advanced into the abdominal aorta. This was used to selectively catheterize the celiac axis. Selective celiac arteriography was performed. The Cobra catheter was then used to selectively catheterize the superior mesenteric artery. Selective arteriography was performed with injection of the main SMA trunk. Arteriography was performed in multiple projections including after administration of intra-arterial nitroglycerin. The Cobra catheter was then used to catheterize the right colic artery and sub selective right colic arteriography was performed. The catheter was then used to selectively catheterize the inferior mesenteric artery and selective arteriography performed with visualization of the entire IMA supply. After the procedure catheter and sheath were removed and hemostasis obtained utilizing the Cordis ExoSeal device. FINDINGS: Celiac arteriography demonstrates normal branch vessel patency and anatomy with no evidence of arterial abnormality or bleeding source. SMA arteriography demonstrates normally patent artery supplying small bowel and the ascending and transverse colon without evidence of active bleeding, pseudoaneurysm, av  malformation or vasculitis. Post nitroglycerin arteriography did not reveal any bleeding. Additional sub selective right colic arteriography demonstrates no evidence of active bleeding or arterial abnormality. IMA arteriography demonstrates normal arterial supply to the descending colon, sigmoid colon and rectum without evidence of bleeding source, arterial abnormality or AV malformation. IMPRESSION: Unremarkable 3 vessel selective mesenteric arteriography and sub selective right colic arteriography demonstrating no active bleeding into the gastrointestinal tract, arterial abnormality or AV malformation. Electronically Signed   By: Aletta Edouard M.D.   On: 04/02/2019 10:53      Scheduled Meds: . budesonide  2 mL Nebulization BID  . carvedilol  3.125 mg Oral BID WC  . fluticasone  1 spray Each Nare Daily  . insulin aspart  0-9 Units Subcutaneous Q4H  . Melatonin  9 mg Oral QHS  . montelukast  10 mg Oral QHS  . peg 3350 powder  0.5 kit Oral Once   And  . [START ON 04/04/2019] peg 3350 powder  0.5 kit  Oral Once  . rOPINIRole  2 mg Oral QHS  . simvastatin  40 mg Oral QHS  . sodium chloride flush  3 mL Intravenous Q12H   Continuous Infusions: . sodium chloride       LOS: 2 days      Time spent: 35 minutes   Dessa Phi, DO [facility] 04/03/2019, 10:35 AM   Available via Epic secure chat 7am-7pm After these hours, please refer to coverage provider listed on [email or web address]

## 2019-04-03 NOTE — Progress Notes (Signed)
PT Cancellation Note  Patient Details Name: [name] MRN: [id number] DOB: 1931-03-08   Cancelled Treatment:    Reason Eval/Treat Not Completed: Medical issues which prohibited therapy. Hgb 6.2.    Lorriane Shire 04/03/2019, 8:15 AM   Lorrin Goodell, PT  Office # [phone] Pager [phone]

## 2019-04-03 NOTE — H&P (View-Only) (Signed)
[facility] Progress Note    Since last GI note: He's really had no overt bleeding (or BM at all) in 48 hours or so.  Hb has drifted and currently 6.2 so he's getting his 4th unit of blood now.  Objective: Vital signs in last 24 hours: Temp:  [97.7 F (36.5 C)-98.6 F (37 C)] 97.7 F (36.5 C) (02/15 0721) Pulse Rate:  [74-87] 85 (02/15 0721) Resp:  [16-20] 19 (02/15 0721) BP: (107-125)/(51-96) 116/71 (02/15 0721) SpO2:  [94 %-100 %] 96 % (02/15 0822) Weight:  [74.8 kg] 74.8 kg (02/15 0347) Last BM Date: 04/01/19 General: alert and oriented times 3, slightly slurred speech is his baseline. Heart: regular rate and rythm Abdomen: soft, non-tender, non-distended, normal bowel sounds   Lab Results: Recent Labs    04/01/19 1652 04/02/19 0327 04/03/19 0345  WBC 12.7* 15.6* 9.9  HGB 7.0* 7.6* 6.2*  PLT 306 284 248  MCV 85.9 86.4 87.0   Recent Labs    04/01/19 0521 04/02/19 0327 04/03/19 0345  NA 136 141 139  K 4.2 3.8 3.4*  CL 102 104 106  CO2 26 27 26   GLUCOSE 157* 147* 106*  BUN 18 33* 20  CREATININE 0.80 0.80 0.68  CALCIUM 7.6* 8.1* 7.7*   Recent Labs    03/31/19 2252 04/01/19 0521  PROT 5.3* 4.5*  ALBUMIN 3.0* 2.5*  AST 15 13*  ALT 11 10  ALKPHOS 47 39  BILITOT 0.4 0.6   No results for input(s): INR in the last 72 hours.   Medications: Scheduled Meds: . budesonide  2 mL Nebulization BID  . carvedilol  3.125 mg Oral BID WC  . fluticasone  1 spray Each Nare Daily  . insulin aspart  0-9 Units Subcutaneous Q4H  . Melatonin  9 mg Oral QHS  . montelukast  10 mg Oral QHS  . potassium chloride  40 mEq Oral Once  . rOPINIRole  2 mg Oral QHS  . simvastatin  40 mg Oral QHS  . sodium chloride flush  3 mL Intravenous Q12H   Continuous Infusions: . sodium chloride     PRN Meds:.sodium chloride, acetaminophen **OR** acetaminophen, acetaminophen **AND** diphenhydrAMINE, albuterol, sodium chloride flush, zolpidem    Assessment/Plan: 86 y.o.  male with recurrent GI bleeding  Recurrent overt bleeding, likely lower source, presumed diverticular. This has been going on intermittent for several years.  The only time the bleeding has been seen on imaging was with Nuc Med bleeding scan 04/2018 ("favors an area of hyperemia in the LEFT mid abdomen either at small bowel or the distal descending colon".) and Nuc Med bleeding scan THIS ADMIT 03/2019 ("right colon as the suspected source").  Colonoscopy Dr. Carlean Purl 2018 showed diverticulosis in the left colon.  Several acute CT angios over the years and also IR angiogram this admission have failed to see active bleeding.  He is 'brittle' from neurologic standpoint and really cannot come off blood thinner plavix any longer than is absolutely necessary.   He's received 3 units blood this admission and cbc down to 6.2 this morning and is getting 4th now.  He's had no overt bleeding in 48 hours at least and no BM either.  Truly between a rock and a hard place. He is proving to have significant, recurrent GI bleeding that is probably coming from his colon.  Likely diverticular.  This has been going on for years and only twice have we seen imaging of acute bleeding and those two nuc med bleeding scans  point to different places in his colon as the site of bleeding.    He and I discussed options (which must include starting blood thinners asap given his brittle neurologic issues). Conservative option is simply transfusing as necessary and as soon as his Hb is in safe range he can go home back on plavix.  More aggressive option is pushing forward with trying to locate and stop the bleeding which he understands may culminate in at least segmental colectomy.  This more aggressive option really starts with repeating a colonoscopy, perhaps I will find an obvious site and can treat it.    For now he wishes to be more aggressive and so we will prepare for a colonoscopy tomorrow. He understands he is at increased risk  of complication (neurologic, perf, bleeding etc).  He is going to talk with his wife about the decision and MAY change his mind after that. We will also ask [facility] to meet with him to discuss overall case, discuss risks if he does elect for colectomy.    Milus Banister, MD  04/03/2019, 9:06 AM [facility] Pager [phone]

## 2019-04-03 NOTE — Progress Notes (Signed)
Patient had a large bloody bowel movement. NP Laverda Page, Raliegh Ip made aware. Will continue with bowel prep. H&H pending.

## 2019-04-03 NOTE — Evaluation (Signed)
Physical Therapy Evaluation Patient Details Name: [name] MRN: [id number] DOB: Aug 29, 1931 Today's Date: 04/03/2019   History of Present Illness  86 y.o. male with history of asthma, hyperlipidemia, DM 2, CHF (EF 45%), h/o stroke, GERD, diverticulosis, who presented with chief complaint of rectal bleeding. Pt admitted with GI bleed.    Clinical Impression  Pt admitted with above diagnosis. PTA pt lived at [place] with his wife, mod I mobility/ADLs using rollator. On eval, pt required min guard assist bed mobility, min guard assist transfers and min guard +2 safety ambulation 25' with RW. Pt presents with deficits in strength, balance, and activity tolerance. Pt currently with functional limitations due to the deficits listed below (see PT Problem List). Pt will benefit from skilled PT to increase their independence and safety with mobility to allow discharge to the venue listed below.       Follow Up Recommendations Home health PT;Supervision for mobility/OOB    Equipment Recommendations  None recommended by PT    Recommendations for Other Services       Precautions / Restrictions Precautions Precautions: Fall      Mobility  Bed Mobility Overal bed mobility: Needs Assistance Bed Mobility: Supine to Sit;Sit to Supine     Supine to sit: Min guard;HOB elevated Sit to supine: Min guard;HOB elevated   General bed mobility comments: min guard for safety due to pt feeling lightheaded/dizzy  Transfers Overall transfer level: Needs assistance Equipment used: Rolling walker (2 wheeled) Transfers: Sit to/from Stand Sit to Stand: Min guard         General transfer comment: min guard for safety  Ambulation/Gait Ambulation/Gait assistance: Min guard;+2 safety/equipment Gait Distance (Feet): 25 Feet Assistive device: Rolling walker (2 wheeled) Gait Pattern/deviations: Step-through pattern;Decreased dorsiflexion - left;Decreased step length - left Gait velocity:  decreased Gait velocity interpretation: <1.31 ft/sec, indicative of household ambulator General Gait Details: distance limited by dizziness. Pt with Hgb 6.2 this AM. Eval completed after pt received first unit of blood. Pt scheduled to receive second unit of blood today.  Stairs            Wheelchair Mobility    Modified Rankin (Stroke Patients Only)       Balance Overall balance assessment: Needs assistance Sitting-balance support: No upper extremity supported;Feet supported Sitting balance-Leahy Scale: Fair     Standing balance support: Bilateral upper extremity supported;During functional activity Standing balance-Leahy Scale: Poor Standing balance comment: reliant on RW                             Pertinent Vitals/Pain Pain Assessment: No/denies pain    Home Living Family/patient expects to be discharged to:: Private residence Living Arrangements: Spouse/significant other Available Help at Discharge: Family;Available 24 hours/day Type of Home: Independent living facility Home Access: Level entry     Home Layout: One level Home Equipment: Grab bars - tub/shower;Grab bars - toilet;Walker - 2 wheels;Walker - 4 wheels;Wheelchair - manual;Hand held shower head;Shower seat - built in Additional Comments: lives at [facility] with his wife    Prior Function Level of Independence: Independent with assistive device(s)         Comments: ambulates with rollator. Drives golf cart but not car.     Hand Dominance   Dominant Hand: Right    Extremity/Trunk Assessment   Upper Extremity Assessment Upper Extremity Assessment: Defer to OT evaluation    Lower Extremity Assessment Lower Extremity Assessment: LLE deficits/detail;Generalized  weakness LLE Deficits / Details: residual deficits due to h/o CVA, wears AFO at baseline LLE Sensation: decreased proprioception LLE Coordination: decreased gross motor;decreased fine motor    Cervical / Trunk  Assessment Cervical / Trunk Assessment: Kyphotic  Communication   Communication: No difficulties  Cognition Arousal/Alertness: Awake/alert Behavior During Therapy: WFL for tasks assessed/performed Overall Cognitive Status: Within Functional Limits for tasks assessed                                        General Comments General comments (skin integrity, edema, etc.): VSS. BP taken seated EOB before and after ambulation. 142/70    Exercises     Assessment/Plan    PT Assessment Patient needs continued PT services  PT Problem List Decreased strength;Decreased mobility;Decreased balance;Decreased activity tolerance       PT Treatment Interventions Therapeutic activities;Gait training;Therapeutic exercise;Patient/family education;Balance training;Functional mobility training    PT Goals (Current goals can be found in the Care Plan section)  Acute Rehab PT Goals Patient Stated Goal: home PT Goal Formulation: With patient/family Time For Goal Achievement: 04/17/19 Potential to Achieve Goals: Good    Frequency Min 3X/week   Barriers to discharge        Co-evaluation               AM-PAC PT "6 Clicks" Mobility  Outcome Measure Help needed turning from your back to your side while in a flat bed without using bedrails?: None Help needed moving from lying on your back to sitting on the side of a flat bed without using bedrails?: None Help needed moving to and from a bed to a chair (including a wheelchair)?: A Little Help needed standing up from a chair using your arms (e.g., wheelchair or bedside chair)?: A Little Help needed to walk in hospital room?: A Little Help needed climbing 3-5 steps with a railing? : A Lot 6 Click Score: 19    End of Session Equipment Utilized During Treatment: Gait belt Activity Tolerance: Treatment limited secondary to medical complications (Comment)(low Hgb) Patient left: in bed;with call bell/phone within reach;with bed  alarm set;with family/visitor present Nurse Communication: Mobility status PT Visit Diagnosis: Unsteadiness on feet (R26.81);Difficulty in walking, not elsewhere classified (R26.2);Muscle weakness (generalized) (M62.81)    Time: [id number] PT Time Calculation (min) (ACUTE ONLY): 28 min   Charges:   PT Evaluation $PT Eval Moderate Complexity: 1 Mod          Lorrin Goodell, PT  Office # [phone] Pager [phone]   Lorriane Shire 04/03/2019, 12:48 PM

## 2019-04-03 NOTE — Evaluation (Signed)
Occupational Therapy Evaluation Patient Details Name: [name] MRN: [id number] DOB: 1931-10-17 Today's Date: 04/03/2019    History of Present Illness 86 y.o. male with history of asthma, hyperlipidemia, DM 2, CHF (EF 45%), h/o stroke, GERD, diverticulosis, who presented with chief complaint of rectal bleeding. Pt admitted with GI bleed.   Clinical Impression   Pt was ambulating with a RW and functioning modified independently in self care. He lives with his supportive wife in a villa at [place] and drives a golf cart around the property. He presents with generalized weakness and mild unsteadiness and lightheadedness with sitting up and ambulation, requiring min guard assist to ambulate about his room. Pt to receive a second unit of blood today. Will follow acutely. Do not anticipate he will need post acute OT.    Follow Up Recommendations  No OT follow up    Equipment Recommendations  None recommended by OT    Recommendations for Other Services       Precautions / Restrictions Precautions Precautions: Fall Precaution Comments: pt frequently passes out per wife      Mobility Bed Mobility Overal bed mobility: Needs Assistance Bed Mobility: Supine to Sit;Sit to Supine     Supine to sit: Min guard;HOB elevated Sit to supine: Min guard;HOB elevated   General bed mobility comments: min guard for safety due to pt feeling lightheaded/dizzy  Transfers Overall transfer level: Needs assistance Equipment used: Rolling walker (2 wheeled) Transfers: Sit to/from Stand Sit to Stand: Min guard         General transfer comment: min guard for safety    Balance Overall balance assessment: Needs assistance Sitting-balance support: No upper extremity supported;Feet supported Sitting balance-Leahy Scale: Good Sitting balance - Comments: no LOB donning socks   Standing balance support: Bilateral upper extremity supported;During functional activity Standing balance-Leahy  Scale: Poor Standing balance comment: reliant on RW                           ADL either performed or assessed with clinical judgement   ADL Overall ADL's : Needs assistance/impaired Eating/Feeding: Independent   Grooming: Min guard;Standing   Upper Body Bathing: Set up;Sitting   Lower Body Bathing: Min guard;Sit to/from stand   Upper Body Dressing : Set up;Sitting   Lower Body Dressing: Min guard;Sit to/from stand   Toilet Transfer: Min guard;Ambulation;RW   Toileting- Water quality scientist and Hygiene: Min guard;Sit to/from stand       Functional mobility during ADLs: Min guard;Rolling walker       Vision Patient Visual Report: No change from baseline       Perception     Praxis      Pertinent Vitals/Pain Pain Assessment: No/denies pain     Hand Dominance Right   Extremity/Trunk Assessment Upper Extremity Assessment Upper Extremity Assessment: Defer to OT evaluation   Lower Extremity Assessment Lower Extremity Assessment: Defer to PT evaluation LLE Deficits / Details: residual deficits due to h/o CVA, wears AFO at baseline LLE Sensation: decreased proprioception LLE Coordination: decreased gross motor;decreased fine motor   Cervical / Trunk Assessment Cervical / Trunk Assessment: Kyphotic   Communication Communication Communication: No difficulties   Cognition Arousal/Alertness: Awake/alert Behavior During Therapy: WFL for tasks assessed/performed Overall Cognitive Status: Within Functional Limits for tasks assessed  General Comments: wife offering much of history   General Comments  VSS. BP taken seated EOB before and after ambulation. 142/70    Exercises     Shoulder Instructions      Home Living Family/patient expects to be discharged to:: Private residence Living Arrangements: Spouse/significant other Available Help at Discharge: Family;Available 24 hours/day Type of Home:  Independent living facility Home Access: Level entry     Home Layout: One level     Bathroom Shower/Tub: Occupational psychologist: Handicapped height Bathroom Accessibility: Yes How Accessible: Accessible via walker Home Equipment: Grab bars - tub/shower;Grab bars - toilet;Walker - 2 wheels;Walker - 4 wheels;Wheelchair - manual;Hand held shower head;Shower seat - built in   Additional Comments: lives at [facility] with his wife      Prior Functioning/Environment Level of Independence: Independent with assistive device(s)        Comments: ambulates with rollator. Drives golf cart but not car.        OT Problem List: Decreased activity tolerance;Impaired balance (sitting and/or standing)      OT Treatment/Interventions: Self-care/ADL training;DME and/or AE instruction;Patient/family education;Balance training    OT Goals(Current goals can be found in the care plan section) Acute Rehab OT Goals Patient Stated Goal: home OT Goal Formulation: With patient Time For Goal Achievement: 04/17/19 Potential to Achieve Goals: Good ADL Goals Pt Will Perform Grooming: with supervision;standing(3 activities) Pt Will Transfer to Toilet: with supervision;ambulating Pt Will Perform Toileting - Clothing Manipulation and hygiene: with supervision;sit to/from stand Pt Will Perform Tub/Shower Transfer: with supervision;rolling walker;shower seat;Shower transfer  OT Frequency: Min 2X/week   Barriers to D/C:            Co-evaluation              AM-PAC OT "6 Clicks" Daily Activity     Outcome Measure Help from another person eating meals?: None Help from another person taking care of personal grooming?: A Little Help from another person toileting, which includes using toliet, bedpan, or urinal?: A Little Help from another person bathing (including washing, rinsing, drying)?: A Little Help from another person to put on and taking off regular upper body clothing?:  None Help from another person to put on and taking off regular lower body clothing?: A Little 6 Click Score: 20   End of Session Equipment Utilized During Treatment: Gait belt;Rolling walker  Activity Tolerance: Patient tolerated treatment well Patient left: in bed;with call bell/phone within reach;with bed alarm set;with family/visitor present  OT Visit Diagnosis: Unsteadiness on feet (R26.81);Other abnormalities of gait and mobility (R26.89);Muscle weakness (generalized) (M62.81)                Time: [id number] OT Time Calculation (min): 23 min Charges:  OT General Charges $OT Visit: 1 Visit OT Evaluation $OT Eval Moderate Complexity: 1 Mod  Nestor Lewandowsky, OTR/L Acute Rehabilitation Services Pager: [phone] Office: [phone] Malka So 04/03/2019, 2:28 PM

## 2019-04-03 NOTE — Progress Notes (Signed)
[facility] Progress Note    Since last GI note: He's really had no overt bleeding (or BM at all) in 48 hours or so.  Hb has drifted and currently 6.2 so he's getting his 4th unit of blood now.  Objective: Vital signs in last 24 hours: Temp:  [97.7 F (36.5 C)-98.6 F (37 C)] 97.7 F (36.5 C) (02/15 0721) Pulse Rate:  [74-87] 85 (02/15 0721) Resp:  [16-20] 19 (02/15 0721) BP: (107-125)/(51-96) 116/71 (02/15 0721) SpO2:  [94 %-100 %] 96 % (02/15 0822) Weight:  [74.8 kg] 74.8 kg (02/15 0347) Last BM Date: 04/01/19 General: alert and oriented times 3, slightly slurred speech is his baseline. Heart: regular rate and rythm Abdomen: soft, non-tender, non-distended, normal bowel sounds   Lab Results: Recent Labs    04/01/19 1652 04/02/19 0327 04/03/19 0345  WBC 12.7* 15.6* 9.9  HGB 7.0* 7.6* 6.2*  PLT 306 284 248  MCV 85.9 86.4 87.0   Recent Labs    04/01/19 0521 04/02/19 0327 04/03/19 0345  NA 136 141 139  K 4.2 3.8 3.4*  CL 102 104 106  CO2 26 27 26   GLUCOSE 157* 147* 106*  BUN 18 33* 20  CREATININE 0.80 0.80 0.68  CALCIUM 7.6* 8.1* 7.7*   Recent Labs    03/31/19 2252 04/01/19 0521  PROT 5.3* 4.5*  ALBUMIN 3.0* 2.5*  AST 15 13*  ALT 11 10  ALKPHOS 47 39  BILITOT 0.4 0.6   No results for input(s): INR in the last 72 hours.   Medications: Scheduled Meds: . budesonide  2 mL Nebulization BID  . carvedilol  3.125 mg Oral BID WC  . fluticasone  1 spray Each Nare Daily  . insulin aspart  0-9 Units Subcutaneous Q4H  . Melatonin  9 mg Oral QHS  . montelukast  10 mg Oral QHS  . potassium chloride  40 mEq Oral Once  . rOPINIRole  2 mg Oral QHS  . simvastatin  40 mg Oral QHS  . sodium chloride flush  3 mL Intravenous Q12H   Continuous Infusions: . sodium chloride     PRN Meds:.sodium chloride, acetaminophen **OR** acetaminophen, acetaminophen **AND** diphenhydrAMINE, albuterol, sodium chloride flush, zolpidem    Assessment/Plan: 86 y.o.  male with recurrent GI bleeding  Recurrent overt bleeding, likely lower source, presumed diverticular. This has been going on intermittent for several years.  The only time the bleeding has been seen on imaging was with Nuc Med bleeding scan 04/2018 ("favors an area of hyperemia in the LEFT mid abdomen either at small bowel or the distal descending colon".) and Nuc Med bleeding scan THIS ADMIT 03/2019 ("right colon as the suspected source").  Colonoscopy Dr. Carlean Purl 2018 showed diverticulosis in the left colon.  Several acute CT angios over the years and also IR angiogram this admission have failed to see active bleeding.  He is 'brittle' from neurologic standpoint and really cannot come off blood thinner plavix any longer than is absolutely necessary.   He's received 3 units blood this admission and cbc down to 6.2 this morning and is getting 4th now.  He's had no overt bleeding in 48 hours at least and no BM either.  Truly between a rock and a hard place. He is proving to have significant, recurrent GI bleeding that is probably coming from his colon.  Likely diverticular.  This has been going on for years and only twice have we seen imaging of acute bleeding and those two nuc med bleeding scans  point to different places in his colon as the site of bleeding.    He and I discussed options (which must include starting blood thinners asap given his brittle neurologic issues). Conservative option is simply transfusing as necessary and as soon as his Hb is in safe range he can go home back on plavix.  More aggressive option is pushing forward with trying to locate and stop the bleeding which he understands may culminate in at least segmental colectomy.  This more aggressive option really starts with repeating a colonoscopy, perhaps I will find an obvious site and can treat it.    For now he wishes to be more aggressive and so we will prepare for a colonoscopy tomorrow. He understands he is at increased risk  of complication (neurologic, perf, bleeding etc).  He is going to talk with his wife about the decision and MAY change his mind after that. We will also ask [facility] to meet with him to discuss overall case, discuss risks if he does elect for colectomy.    Milus Banister, MD  04/03/2019, 9:06 AM [facility] Pager [phone]

## 2019-04-03 NOTE — Progress Notes (Signed)
OT Cancellation Note  Patient Details Name: KARREEM CAN MRN: [id number] DOB: August 05, 1931   Cancelled Treatment:    Reason Eval/Treat Not Completed: Medical issues which prohibited therapy(pt with hgb of 6.2)  Malka So 04/03/2019, 8:46 AM  Nestor Lewandowsky, OTR/L [facility] Pager: [phone] Office: [phone]

## 2019-04-03 NOTE — Consult Note (Addendum)
[facility] Consult Note  KAIROS BUNTING [birth year]  DC:[id number].    Requesting MD: Owens Loffler Chief Complaint: Rectal bleeding/syncope Reason for Consult: Diverticular bleeding  HPI:  Patient is an 86 year old male with a history of asthma, hyperlipidemia, type 2 diabetes, history of congestive heart failure(EF 45%), history of stroke, GERD, diverticulosis with multiple bleeds over the years, his best guess was 12-15.  He presented to the ED on 04/01/2019 with rectal bleeding that started around 7:30 PM he had a second episode of bleeding later and stood up and had a brief syncopal episode.  He denied NSAID use.  He does take Plavix patient notes he is drinking some wine as well.  He is in independent living at [facility].  On admission he was transfused with 4 units of packed cells on 2/12 and 2/13.  His last colonoscopy was 12/12/2016, by Dr. Michelene Heady.  This showed left-sided diverticulosis with no other significant abnormality.  He was seen by Dr. Wilfrid Lund, [facility], with an  assumed diverticular bleed on Plavix, 04/01/2019.   He has continued to pass large amount of red blood while Dr. Loletha Carrow was in the room.  He reported a similar episode several months ago and underwent colonoscopy at that time.  GI bleeding scan obtained on 04/01/19, shows active bleeding in the right colon as the suspected source.  Angiogram later in the day was unremarkable.   3 vessel selective mesenteric arteriography and subselective right colonic arteriography demonstrated no active bleeding into the GI tract, arterial abnormality or AV malformation.  Despite no overt bleeding over the last 48 hours his hemoglobins drifted down from 8.2>>6.2.  He is being transfused another 2 units today.  Dr. Ardis Hughs note today notes he is "brittle" from a neurologic standpoint cannot come off Plavix any longer than necessary.  He has had other episodes of bleeding in the past.  Dr. Ardis Hughs has discussed conservative  treatment which includes just transfusing on a as needed basis versus more aggressive treatment which includes possible surgical resection.  Patient is scheduled for colonoscopy for tomorrow.  We are asked to see for possible surgical evaluation.  ROS: Review of Systems  Constitutional: Negative.   HENT: Negative.   Eyes: Negative.   Respiratory: Negative.   Cardiovascular: Negative.        Left ventricle: The cavity size was normal. Wall thickness was  normal. Systolic function was mildly to moderately reduced. The  estimated ejection fraction was 45%. Doppler parameters are  consistent with abnormal left ventricular relaxation (grade 1  diastolic dysfunction). D echo 02/24/2016:  Gastrointestinal: Positive for blood in stool and melena. Negative for abdominal pain, constipation, diarrhea, heartburn, nausea and vomiting.  Genitourinary: Negative.   Musculoskeletal: Negative.        Since his CVA he remains weak on the left side and uses a walker for ambulation  Skin: Negative.   Neurological:       MRI 05/04/2016: Remote lacunar infarct involving the right basal ganglia.  Mild chronic microvascular ischemic disease.  Last carotid Dopplers show bilateral low-grade stenosis 1% to 39%.  A syncopal episode secondary to hypotension/GI bleed/anemia occurred and also led to his admission.  Endo/Heme/Allergies: Bruises/bleeds easily (On Plavix).  Psychiatric/Behavioral: Negative.     Family History  Problem Relation Age of Onset  . Alzheimer's disease Mother   . Breast cancer Mother   . Throat cancer Father   . Colon cancer Neg Hx   . Colon polyps Neg Hx   .  Pancreatic cancer Neg Hx   . Stomach cancer Neg Hx   . Liver disease Neg Hx     Past Medical History:  Diagnosis Date  . Acute CVA (cerebrovascular accident) ([place]) 07/25/2015  . Asthma   . Benign essential HTN   . Cerebral infarction due to embolism of left middle cerebral artery ([place]) 02/03/2014  . Chronic combined  systolic and diastolic CHF (congestive heart failure) ([place]) 12/23/2017  . Colitis   . Colon polyps   . Diabetes mellitus type 2, diet-controlled ([place]) 12/22/2013  . Diverticulosis   . GERD (gastroesophageal reflux disease)   . GI bleed   . Hemorrhoids   . Hiatal hernia   . History of esophageal strciture   . HLD (hyperlipidemia)   . Osteoarthritis   . Proctitis   . Status post dilation of esophageal narrowing   . Stroke [place])     Past Surgical History:  Procedure Laterality Date  . ABDOMINAL HERNIA REPAIR    . COLONOSCOPY     multiple  . COLONOSCOPY WITH PROPOFOL N/A 12/12/2016   Procedure: COLONOSCOPY WITH PROPOFOL;  Surgeon: Gatha Mayer, MD;  Location: [facility] ENDOSCOPY;  Service: Endoscopy;  Laterality: N/A;  . ESOPHAGOGASTRODUODENOSCOPY     multiple  . INSERTION OF MESH  01/29/2012   Procedure: INSERTION OF MESH;  Surgeon: Gwenyth Ober, MD;  Location: [facility];  Service: General;  Laterality: N/A;  . IR ANGIOGRAM SELECTIVE EACH ADDITIONAL VESSEL  04/01/2019  . IR ANGIOGRAM VISCERAL SELECTIVE  04/01/2019  . IR ANGIOGRAM VISCERAL SELECTIVE  04/01/2019  . IR ANGIOGRAM VISCERAL SELECTIVE  04/01/2019  . IR US GUIDE VASC ACCESS RIGHT  04/01/2019  . NISSEN FUNDOPLICATION    . SPLENECTOMY, TOTAL     nontraumatic rupture  . VENTRAL HERNIA REPAIR  01/29/2012    WITH MESH 01/29/2012 Dr. Judeth Horn   Social History:  reports that he has never smoked. He has never used smokeless tobacco. He reports current alcohol use of about 2.0 standard drinks of alcohol per week. He reports that he does not use drugs.  Allergies:  Allergies  Allergen Reactions  . Tape Other (See Comments)    SKIN IS SENSITIVE; PLEASE USE PAPER TAPE; SKIN BRUISES AND TEARS EASILY!!  . Azithromycin Rash and Other (See Comments)    Pt had a Z-Pak Jan, 2020 - no reaction(??)    Medications Prior to Admission  Medication Sig Dispense Refill  . albuterol (PROAIR HFA) 108 (90 Base) MCG/ACT inhaler Inhale 2 puffs into  the lungs every 6 (six) hours as needed for wheezing or shortness of breath.    Marland Kitchen albuterol (PROVENTIL) (2.5 MG/3ML) 0.083% nebulizer solution Take 3 mLs by nebulization every 6 (six) hours as needed for wheezing or shortness of breath.     . budesonide (PULMICORT) 0.5 MG/2ML nebulizer solution Take 2 mLs by nebulization 2 (two) times daily.    . carvedilol (COREG) 3.125 MG tablet Take 1 tablet (3.125 mg total) by mouth 2 (two) times daily with a meal. 60 tablet 1  . clopidogrel (PLAVIX) 75 MG tablet Take 75 mg by mouth daily.     . diphenhydramine-acetaminophen (TYLENOL PM) 25-500 MG TABS tablet Take 2 tablets by mouth at bedtime.     . ferrous sulfate 325 (65 FE) MG tablet Take 1 tablet (325 mg total) by mouth 2 (two) times daily with a meal. (Patient taking differently: Take 325 mg by mouth daily with breakfast. ) 60 tablet 0  . fluticasone (FLONASE) 50  MCG/ACT nasal spray Place 1 spray into both nostrils daily as needed for allergies or rhinitis.   5  . Melatonin 10 MG TABS Take 10 mg by mouth at bedtime as needed (for sleep).     . metFORMIN (GLUCOPHAGE) 1000 MG tablet Take 1,000 mg by mouth daily after breakfast.     . montelukast (SINGULAIR) 10 MG tablet Take 10 mg by mouth at bedtime as needed (for breathing flares).     . polyethylene glycol (MIRALAX / GLYCOLAX) packet Take 17 g by mouth daily as needed for mild constipation (MIX AND DRINK).     . predniSONE (DELTASONE) 10 MG tablet Take 10 mg by mouth daily.    . psyllium (METAMUCIL) 58.6 % powder Take 1 packet by mouth daily as needed (for constipation- MIX AND DRINK).     Marland Kitchen rOPINIRole (REQUIP) 1 MG tablet Take 2 mg by mouth at bedtime.    . simvastatin (ZOCOR) 40 MG tablet Take 1 tablet (40 mg total) by mouth daily. (Patient taking differently: Take 40 mg by mouth at bedtime. ) 30 tablet 3  . co-enzyme Q-10 30 MG capsule Take 7 capsules (210 mg total) by mouth daily. (Patient not taking: Reported on 04/01/2019) 1 capsule 1    Blood  pressure 116/71, pulse 85, temperature 97.7 F (36.5 C), temperature source Oral, resp. rate 19, height 5\' 10"  (1.778 m), weight 74.8 kg, SpO2 96 %. Physical Exam:  General: pleasant, WD, WN white male who is laying in bed in NAD HEENT: head is normocephalic, atraumatic.  Sclera are noninjected.    Mouth is pink and moist Heart: regular, rate, and rhythm.  Normal s1,s2. No obvious murmurs, gallops, or rubs noted.  Palpable radial and pedal pulses bilaterally Lungs: CTAB, no wheezes, rhonchi, or rales noted.  Respiratory effort nonlabored Abd: soft, NT, ND, +BS, large midline surgical incision with a history of splenectomy and ventral hernia repair MS: all 4 extremities are symmetrical with no cyanosis, clubbing, or edema. Skin: warm and dry with no masses, lesions, or rashes Neuro: Cranial nerves 2-12 grossly intact, speech is normal Psych: A&Ox3 with an appropriate affect.   Results for orders placed or performed during the hospital encounter of 03/31/19 (from the past 48 hour(s))  CBC     Status: Abnormal   Collection Time: 04/01/19 11:14 AM  Result Value Ref Range   WBC 10.2 4.0 - 10.5 K/uL   RBC 2.95 (L) 4.22 - 5.81 MIL/uL   Hemoglobin 8.0 (L) 13.0 - 17.0 g/dL   HCT 25.1 (L) 39.0 - 52.0 %   MCV 85.1 80.0 - 100.0 fL   MCH 27.1 26.0 - 34.0 pg   MCHC 31.9 30.0 - 36.0 g/dL   RDW 16.5 (H) 11.5 - 15.5 %   Platelets 313 150 - 400 K/uL   nRBC 0.2 0.0 - 0.2 %    Comment: Performed at [facility], [place] [address], [place], [place] [address]  Glucose, capillary     Status: Abnormal   Collection Time: 04/01/19 11:39 AM  Result Value Ref Range   Glucose-Capillary 132 (H) 70 - 99 mg/dL  CBC     Status: Abnormal   Collection Time: 04/01/19  4:52 PM  Result Value Ref Range   WBC 12.7 (H) 4.0 - 10.5 K/uL   RBC 2.62 (L) 4.22 - 5.81 MIL/uL   Hemoglobin 7.0 (L) 13.0 - 17.0 g/dL   HCT 22.5 (L) 39.0 - 52.0 %   MCV 85.9 80.0 - 100.0  fL   MCH 26.7 26.0 - 34.0 pg   MCHC 31.1 30.0 - 36.0  g/dL   RDW 16.9 (H) 11.5 - 15.5 %   Platelets 306 150 - 400 K/uL   nRBC 0.0 0.0 - 0.2 %    Comment: Performed at [facility] [address], [place], [place] [address]  Prepare RBC     Status: None   Collection Time: 04/01/19  4:55 PM  Result Value Ref Range   Order Confirmation      ORDER PROCESSED BY BLOOD BANK Performed at [facility], [place] [address] [place], [place] [address]   Glucose, capillary     Status: Abnormal   Collection Time: 04/01/19  5:04 PM  Result Value Ref Range   Glucose-Capillary 141 (H) 70 - 99 mg/dL  Glucose, capillary     Status: Abnormal   Collection Time: 04/01/19  8:02 PM  Result Value Ref Range   Glucose-Capillary 124 (H) 70 - 99 mg/dL  Glucose, capillary     Status: Abnormal   Collection Time: 04/02/19 12:04 AM  Result Value Ref Range   Glucose-Capillary 151 (H) 70 - 99 mg/dL  Basic metabolic panel     Status: Abnormal   Collection Time: 04/02/19  3:27 AM  Result Value Ref Range   Sodium 141 135 - 145 mmol/L   Potassium 3.8 3.5 - 5.1 mmol/L   Chloride 104 98 - 111 mmol/L   CO2 27 22 - 32 mmol/L   Glucose, Bld 147 (H) 70 - 99 mg/dL   BUN 33 (H) 8 - 23 mg/dL   Creatinine, Ser 0.80 0.61 - 1.24 mg/dL   Calcium 8.1 (L) 8.9 - 10.3 mg/dL   GFR calc non Af Amer >60 >60 mL/min   GFR calc Af Amer >60 >60 mL/min   Anion gap 10 5 - 15    Comment: Performed at [facility], [place] [address] [place], [place] [address]  CBC     Status: Abnormal   Collection Time: 04/02/19  3:27 AM  Result Value Ref Range   WBC 15.6 (H) 4.0 - 10.5 K/uL   RBC 2.80 (L) 4.22 - 5.81 MIL/uL   Hemoglobin 7.6 (L) 13.0 - 17.0 g/dL   HCT 24.2 (L) 39.0 - 52.0 %   MCV 86.4 80.0 - 100.0 fL   MCH 27.1 26.0 - 34.0 pg   MCHC 31.4 30.0 - 36.0 g/dL   RDW 16.2 (H) 11.5 - 15.5 %   Platelets 284 150 - 400 K/uL   nRBC 0.1 0.0 - 0.2 %    Comment: Performed at [facility], [place] [address] [place], [place] [address]  Glucose, capillary     Status: Abnormal    Collection Time: 04/02/19  3:54 AM  Result Value Ref Range   Glucose-Capillary 138 (H) 70 - 99 mg/dL  Glucose, capillary     Status: Abnormal   Collection Time: 04/02/19  7:44 AM  Result Value Ref Range   Glucose-Capillary 119 (H) 70 - 99 mg/dL  Glucose, capillary     Status: Abnormal   Collection Time: 04/02/19 11:46 AM  Result Value Ref Range   Glucose-Capillary 179 (H) 70 - 99 mg/dL  Glucose, capillary     Status: None   Collection Time: 04/02/19  3:58 PM  Result Value Ref Range   Glucose-Capillary 74 70 - 99 mg/dL  Glucose, capillary     Status: Abnormal   Collection Time: 04/02/19  7:53  PM  Result Value Ref Range   Glucose-Capillary 171 (H) 70 - 99 mg/dL  Glucose, capillary     Status: None   Collection Time: 04/02/19 11:59 PM  Result Value Ref Range   Glucose-Capillary 85 70 - 99 mg/dL  Glucose, capillary     Status: None   Collection Time: 04/03/19  3:44 AM  Result Value Ref Range   Glucose-Capillary 98 70 - 99 mg/dL  CBC Once     Status: Abnormal   Collection Time: 04/03/19  3:45 AM  Result Value Ref Range   WBC 9.9 4.0 - 10.5 K/uL   RBC 2.23 (L) 4.22 - 5.81 MIL/uL   Hemoglobin 6.2 (LL) 13.0 - 17.0 g/dL    Comment: REPEATED TO VERIFY THIS CRITICAL RESULT HAS VERIFIED AND BEEN CALLED TO B.LITTLE,RN BY MELISSA BROGDON ON 02 15 2021 AT 0409, AND HAS BEEN READ BACK.     HCT 19.4 (L) 39.0 - 52.0 %   MCV 87.0 80.0 - 100.0 fL   MCH 27.8 26.0 - 34.0 pg   MCHC 32.0 30.0 - 36.0 g/dL   RDW 16.1 (H) 11.5 - 15.5 %   Platelets 248 150 - 400 K/uL   nRBC 0.0 0.0 - 0.2 %    Comment: Performed at [facility] [address] [place], [place] [id number]  Basic metabolic panel Once     Status: Abnormal   Collection Time: 04/03/19  3:45 AM  Result Value Ref Range   Sodium 139 135 - 145 mmol/L   Potassium 3.4 (L) 3.5 - 5.1 mmol/L   Chloride 106 98 - 111 mmol/L   CO2 26 22 - 32 mmol/L   Glucose, Bld 106 (H) 70 - 99 mg/dL   BUN 20 8 - 23 mg/dL   Creatinine, Ser 0.68 0.61 -  1.24 mg/dL   Calcium 7.7 (L) 8.9 - 10.3 mg/dL   GFR calc non Af Amer >60 >60 mL/min   GFR calc Af Amer >60 >60 mL/min   Anion gap 7 5 - 15    Comment: Performed at [facility] [address], [place], [place] [address]  Prepare RBC     Status: None   Collection Time: 04/03/19  5:28 AM  Result Value Ref Range   Order Confirmation      ORDER PROCESSED BY BLOOD BANK Performed at [facility], [place] [address], [place], [place] [address]   Glucose, capillary     Status: Abnormal   Collection Time: 04/03/19  8:27 AM  Result Value Ref Range   Glucose-Capillary 105 (H) 70 - 99 mg/dL   NM GI Blood Loss  Result Date: 04/01/2019 CLINICAL DATA:  Rectal bleeding with syncope EXAM: NUCLEAR MEDICINE GASTROINTESTINAL BLEEDING SCAN TECHNIQUE: Sequential abdominal images were obtained following intravenous administration of Tc-93m labeled red blood cells. RADIOPHARMACEUTICALS:  27.2 mCi Tc-65m pertechnetate in-vitro labeled red cells. COMPARISON:  CT angiography 04/01/2019, GI bleed study 04/22/2018 FINDINGS: Intense focus of activity in the right peripheral mid abdomen with progression of activity transversely across the upper abdomen and to the left upper quadrant. Based on CT, this mostly likely represents active bleeding within the right colon. Physiologic activity is present within the bladder. IMPRESSION: Positive for active GI bleeding, with right colon as the suspected source of GI bleeding. These results will be called to the ordering clinician or representative by the Radiologist Assistant, and communication documented in the PACS or zVision Dashboard. Electronically Signed   By: Donavan Foil  M.D.   On: 04/01/2019 16:17   IR Angiogram Visceral Selective  Result Date: 04/02/2019 INDICATION: Lower GI bleed with initial negative CT angiography. Due to continued bleeding, a bleeding scan was performed earlier this afternoon which was positive in the region of the right colon. Arteriography  is now performed to determine whether there is a targetable bleed to treat with embolization. EXAM: 1. ULTRASOUND GUIDANCE FOR VASCULAR ACCESS OF THE RIGHT COMMON FEMORAL ARTERY 2. SELECTIVE VISCERAL ARTERIOGRAPHY OF THE CELIAC AXIS 3. SELECTIVE VISCERAL ARTERIOGRAPHY OF THE SUPERIOR MESENTERIC ARTERY 4. ADDITIONAL SELECTIVE ARTERIOGRAPHY OF THE RIGHT COLIC ARTERY 5. SELECTIVE VISCERAL ARTERIOGRAPHY OF THE INFERIOR MESENTERIC ARTERY MEDICATIONS: 100 mcg intra-arterial nitroglycerin administered into the superior mesenteric artery during the arteriogram. ANESTHESIA/SEDATION: Moderate (conscious) sedation was employed during this procedure. A total of Versed 2.0 mg and Fentanyl 75 mcg was administered intravenously. Moderate Sedation Time: 50 minutes. The patient's level of consciousness and vital signs were monitored continuously by radiology nursing throughout the procedure under my direct supervision. CONTRAST:  83 mL Omnipaque 300 FLUOROSCOPY TIME:  Fluoroscopy Time: 8 minutes and 24 seconds. 268 mGy. COMPLICATIONS: None immediate. PROCEDURE: Informed consent was obtained from the patient following explanation of the procedure, risks, benefits and alternatives. The patient understands, agrees and consents for the procedure. All questions were addressed. A time out was performed prior to the initiation of the procedure. Maximal barrier sterile technique utilized including caps, mask, sterile gowns, sterile gloves, large sterile drape, hand hygiene, and chlorhexidine prep. Ultrasound was performed to confirm patency of the right common femoral artery. Under ultrasound guidance, access of the artery was performed utilizing a 21 gauge needle and micropuncture set. Ultrasound image documentation was performed. A 5 French sheath was placed over a guidewire. A 5 French Cobra catheter was advanced into the abdominal aorta. This was used to selectively catheterize the celiac axis. Selective celiac arteriography was  performed. The Cobra catheter was then used to selectively catheterize the superior mesenteric artery. Selective arteriography was performed with injection of the main SMA trunk. Arteriography was performed in multiple projections including after administration of intra-arterial nitroglycerin. The Cobra catheter was then used to catheterize the right colic artery and sub selective right colic arteriography was performed. The catheter was then used to selectively catheterize the inferior mesenteric artery and selective arteriography performed with visualization of the entire IMA supply. After the procedure catheter and sheath were removed and hemostasis obtained utilizing the Cordis ExoSeal device. FINDINGS: Celiac arteriography demonstrates normal branch vessel patency and anatomy with no evidence of arterial abnormality or bleeding source. SMA arteriography demonstrates normally patent artery supplying small bowel and the ascending and transverse colon without evidence of active bleeding, pseudoaneurysm, av malformation or vasculitis. Post nitroglycerin arteriography did not reveal any bleeding. Additional sub selective right colic arteriography demonstrates no evidence of active bleeding or arterial abnormality. IMA arteriography demonstrates normal arterial supply to the descending colon, sigmoid colon and rectum without evidence of bleeding source, arterial abnormality or AV malformation. IMPRESSION: Unremarkable 3 vessel selective mesenteric arteriography and sub selective right colic arteriography demonstrating no active bleeding into the gastrointestinal tract, arterial abnormality or AV malformation. Electronically Signed   By: Aletta Edouard M.D.   On: 04/02/2019 10:53   IR Angiogram Visceral Selective  Result Date: 04/02/2019 INDICATION: Lower GI bleed with initial negative CT angiography. Due to continued bleeding, a bleeding scan was performed earlier this afternoon which was positive in the region  of the right colon. Arteriography is now performed to determine  whether there is a targetable bleed to treat with embolization. EXAM: 1. ULTRASOUND GUIDANCE FOR VASCULAR ACCESS OF THE RIGHT COMMON FEMORAL ARTERY 2. SELECTIVE VISCERAL ARTERIOGRAPHY OF THE CELIAC AXIS 3. SELECTIVE VISCERAL ARTERIOGRAPHY OF THE SUPERIOR MESENTERIC ARTERY 4. ADDITIONAL SELECTIVE ARTERIOGRAPHY OF THE RIGHT COLIC ARTERY 5. SELECTIVE VISCERAL ARTERIOGRAPHY OF THE INFERIOR MESENTERIC ARTERY MEDICATIONS: 100 mcg intra-arterial nitroglycerin administered into the superior mesenteric artery during the arteriogram. ANESTHESIA/SEDATION: Moderate (conscious) sedation was employed during this procedure. A total of Versed 2.0 mg and Fentanyl 75 mcg was administered intravenously. Moderate Sedation Time: 50 minutes. The patient's level of consciousness and vital signs were monitored continuously by radiology nursing throughout the procedure under my direct supervision. CONTRAST:  83 mL Omnipaque 300 FLUOROSCOPY TIME:  Fluoroscopy Time: 8 minutes and 24 seconds. 268 mGy. COMPLICATIONS: None immediate. PROCEDURE: Informed consent was obtained from the patient following explanation of the procedure, risks, benefits and alternatives. The patient understands, agrees and consents for the procedure. All questions were addressed. A time out was performed prior to the initiation of the procedure. Maximal barrier sterile technique utilized including caps, mask, sterile gowns, sterile gloves, large sterile drape, hand hygiene, and chlorhexidine prep. Ultrasound was performed to confirm patency of the right common femoral artery. Under ultrasound guidance, access of the artery was performed utilizing a 21 gauge needle and micropuncture set. Ultrasound image documentation was performed. A 5 French sheath was placed over a guidewire. A 5 French Cobra catheter was advanced into the abdominal aorta. This was used to selectively catheterize the celiac axis.  Selective celiac arteriography was performed. The Cobra catheter was then used to selectively catheterize the superior mesenteric artery. Selective arteriography was performed with injection of the main SMA trunk. Arteriography was performed in multiple projections including after administration of intra-arterial nitroglycerin. The Cobra catheter was then used to catheterize the right colic artery and sub selective right colic arteriography was performed. The catheter was then used to selectively catheterize the inferior mesenteric artery and selective arteriography performed with visualization of the entire IMA supply. After the procedure catheter and sheath were removed and hemostasis obtained utilizing the Cordis ExoSeal device. FINDINGS: Celiac arteriography demonstrates normal branch vessel patency and anatomy with no evidence of arterial abnormality or bleeding source. SMA arteriography demonstrates normally patent artery supplying small bowel and the ascending and transverse colon without evidence of active bleeding, pseudoaneurysm, av malformation or vasculitis. Post nitroglycerin arteriography did not reveal any bleeding. Additional sub selective right colic arteriography demonstrates no evidence of active bleeding or arterial abnormality. IMA arteriography demonstrates normal arterial supply to the descending colon, sigmoid colon and rectum without evidence of bleeding source, arterial abnormality or AV malformation. IMPRESSION: Unremarkable 3 vessel selective mesenteric arteriography and sub selective right colic arteriography demonstrating no active bleeding into the gastrointestinal tract, arterial abnormality or AV malformation. Electronically Signed   By: Aletta Edouard M.D.   On: 04/02/2019 10:53   IR Angiogram Visceral Selective  Result Date: 04/02/2019 INDICATION: Lower GI bleed with initial negative CT angiography. Due to continued bleeding, a bleeding scan was performed earlier this  afternoon which was positive in the region of the right colon. Arteriography is now performed to determine whether there is a targetable bleed to treat with embolization. EXAM: 1. ULTRASOUND GUIDANCE FOR VASCULAR ACCESS OF THE RIGHT COMMON FEMORAL ARTERY 2. SELECTIVE VISCERAL ARTERIOGRAPHY OF THE CELIAC AXIS 3. SELECTIVE VISCERAL ARTERIOGRAPHY OF THE SUPERIOR MESENTERIC ARTERY 4. ADDITIONAL SELECTIVE ARTERIOGRAPHY OF THE RIGHT COLIC ARTERY 5. SELECTIVE VISCERAL ARTERIOGRAPHY OF  THE INFERIOR MESENTERIC ARTERY MEDICATIONS: 100 mcg intra-arterial nitroglycerin administered into the superior mesenteric artery during the arteriogram. ANESTHESIA/SEDATION: Moderate (conscious) sedation was employed during this procedure. A total of Versed 2.0 mg and Fentanyl 75 mcg was administered intravenously. Moderate Sedation Time: 50 minutes. The patient's level of consciousness and vital signs were monitored continuously by radiology nursing throughout the procedure under my direct supervision. CONTRAST:  83 mL Omnipaque 300 FLUOROSCOPY TIME:  Fluoroscopy Time: 8 minutes and 24 seconds. 268 mGy. COMPLICATIONS: None immediate. PROCEDURE: Informed consent was obtained from the patient following explanation of the procedure, risks, benefits and alternatives. The patient understands, agrees and consents for the procedure. All questions were addressed. A time out was performed prior to the initiation of the procedure. Maximal barrier sterile technique utilized including caps, mask, sterile gowns, sterile gloves, large sterile drape, hand hygiene, and chlorhexidine prep. Ultrasound was performed to confirm patency of the right common femoral artery. Under ultrasound guidance, access of the artery was performed utilizing a 21 gauge needle and micropuncture set. Ultrasound image documentation was performed. A 5 French sheath was placed over a guidewire. A 5 French Cobra catheter was advanced into the abdominal aorta. This was used to  selectively catheterize the celiac axis. Selective celiac arteriography was performed. The Cobra catheter was then used to selectively catheterize the superior mesenteric artery. Selective arteriography was performed with injection of the main SMA trunk. Arteriography was performed in multiple projections including after administration of intra-arterial nitroglycerin. The Cobra catheter was then used to catheterize the right colic artery and sub selective right colic arteriography was performed. The catheter was then used to selectively catheterize the inferior mesenteric artery and selective arteriography performed with visualization of the entire IMA supply. After the procedure catheter and sheath were removed and hemostasis obtained utilizing the Cordis ExoSeal device. FINDINGS: Celiac arteriography demonstrates normal branch vessel patency and anatomy with no evidence of arterial abnormality or bleeding source. SMA arteriography demonstrates normally patent artery supplying small bowel and the ascending and transverse colon without evidence of active bleeding, pseudoaneurysm, av malformation or vasculitis. Post nitroglycerin arteriography did not reveal any bleeding. Additional sub selective right colic arteriography demonstrates no evidence of active bleeding or arterial abnormality. IMA arteriography demonstrates normal arterial supply to the descending colon, sigmoid colon and rectum without evidence of bleeding source, arterial abnormality or AV malformation. IMPRESSION: Unremarkable 3 vessel selective mesenteric arteriography and sub selective right colic arteriography demonstrating no active bleeding into the gastrointestinal tract, arterial abnormality or AV malformation. Electronically Signed   By: Aletta Edouard M.D.   On: 04/02/2019 10:53   IR Angiogram Selective Each Additional Vessel  Result Date: 04/02/2019 INDICATION: Lower GI bleed with initial negative CT angiography. Due to continued  bleeding, a bleeding scan was performed earlier this afternoon which was positive in the region of the right colon. Arteriography is now performed to determine whether there is a targetable bleed to treat with embolization. EXAM: 1. ULTRASOUND GUIDANCE FOR VASCULAR ACCESS OF THE RIGHT COMMON FEMORAL ARTERY 2. SELECTIVE VISCERAL ARTERIOGRAPHY OF THE CELIAC AXIS 3. SELECTIVE VISCERAL ARTERIOGRAPHY OF THE SUPERIOR MESENTERIC ARTERY 4. ADDITIONAL SELECTIVE ARTERIOGRAPHY OF THE RIGHT COLIC ARTERY 5. SELECTIVE VISCERAL ARTERIOGRAPHY OF THE INFERIOR MESENTERIC ARTERY MEDICATIONS: 100 mcg intra-arterial nitroglycerin administered into the superior mesenteric artery during the arteriogram. ANESTHESIA/SEDATION: Moderate (conscious) sedation was employed during this procedure. A total of Versed 2.0 mg and Fentanyl 75 mcg was administered intravenously. Moderate Sedation Time: 50 minutes. The patient's level of consciousness and vital  signs were monitored continuously by radiology nursing throughout the procedure under my direct supervision. CONTRAST:  83 mL Omnipaque 300 FLUOROSCOPY TIME:  Fluoroscopy Time: 8 minutes and 24 seconds. 268 mGy. COMPLICATIONS: None immediate. PROCEDURE: Informed consent was obtained from the patient following explanation of the procedure, risks, benefits and alternatives. The patient understands, agrees and consents for the procedure. All questions were addressed. A time out was performed prior to the initiation of the procedure. Maximal barrier sterile technique utilized including caps, mask, sterile gowns, sterile gloves, large sterile drape, hand hygiene, and chlorhexidine prep. Ultrasound was performed to confirm patency of the right common femoral artery. Under ultrasound guidance, access of the artery was performed utilizing a 21 gauge needle and micropuncture set. Ultrasound image documentation was performed. A 5 French sheath was placed over a guidewire. A 5 French Cobra catheter was  advanced into the abdominal aorta. This was used to selectively catheterize the celiac axis. Selective celiac arteriography was performed. The Cobra catheter was then used to selectively catheterize the superior mesenteric artery. Selective arteriography was performed with injection of the main SMA trunk. Arteriography was performed in multiple projections including after administration of intra-arterial nitroglycerin. The Cobra catheter was then used to catheterize the right colic artery and sub selective right colic arteriography was performed. The catheter was then used to selectively catheterize the inferior mesenteric artery and selective arteriography performed with visualization of the entire IMA supply. After the procedure catheter and sheath were removed and hemostasis obtained utilizing the Cordis ExoSeal device. FINDINGS: Celiac arteriography demonstrates normal branch vessel patency and anatomy with no evidence of arterial abnormality or bleeding source. SMA arteriography demonstrates normally patent artery supplying small bowel and the ascending and transverse colon without evidence of active bleeding, pseudoaneurysm, av malformation or vasculitis. Post nitroglycerin arteriography did not reveal any bleeding. Additional sub selective right colic arteriography demonstrates no evidence of active bleeding or arterial abnormality. IMA arteriography demonstrates normal arterial supply to the descending colon, sigmoid colon and rectum without evidence of bleeding source, arterial abnormality or AV malformation. IMPRESSION: Unremarkable 3 vessel selective mesenteric arteriography and sub selective right colic arteriography demonstrating no active bleeding into the gastrointestinal tract, arterial abnormality or AV malformation. Electronically Signed   By: Aletta Edouard M.D.   On: 04/02/2019 10:53   IR US Guide Vasc Access Right  Result Date: 04/02/2019 INDICATION: Lower GI bleed with initial negative CT  angiography. Due to continued bleeding, a bleeding scan was performed earlier this afternoon which was positive in the region of the right colon. Arteriography is now performed to determine whether there is a targetable bleed to treat with embolization. EXAM: 1. ULTRASOUND GUIDANCE FOR VASCULAR ACCESS OF THE RIGHT COMMON FEMORAL ARTERY 2. SELECTIVE VISCERAL ARTERIOGRAPHY OF THE CELIAC AXIS 3. SELECTIVE VISCERAL ARTERIOGRAPHY OF THE SUPERIOR MESENTERIC ARTERY 4. ADDITIONAL SELECTIVE ARTERIOGRAPHY OF THE RIGHT COLIC ARTERY 5. SELECTIVE VISCERAL ARTERIOGRAPHY OF THE INFERIOR MESENTERIC ARTERY MEDICATIONS: 100 mcg intra-arterial nitroglycerin administered into the superior mesenteric artery during the arteriogram. ANESTHESIA/SEDATION: Moderate (conscious) sedation was employed during this procedure. A total of Versed 2.0 mg and Fentanyl 75 mcg was administered intravenously. Moderate Sedation Time: 50 minutes. The patient's level of consciousness and vital signs were monitored continuously by radiology nursing throughout the procedure under my direct supervision. CONTRAST:  83 mL Omnipaque 300 FLUOROSCOPY TIME:  Fluoroscopy Time: 8 minutes and 24 seconds. 268 mGy. COMPLICATIONS: None immediate. PROCEDURE: Informed consent was obtained from the patient following explanation of the procedure, risks, benefits and alternatives.  The patient understands, agrees and consents for the procedure. All questions were addressed. A time out was performed prior to the initiation of the procedure. Maximal barrier sterile technique utilized including caps, mask, sterile gowns, sterile gloves, large sterile drape, hand hygiene, and chlorhexidine prep. Ultrasound was performed to confirm patency of the right common femoral artery. Under ultrasound guidance, access of the artery was performed utilizing a 21 gauge needle and micropuncture set. Ultrasound image documentation was performed. A 5 French sheath was placed over a guidewire. A 5  French Cobra catheter was advanced into the abdominal aorta. This was used to selectively catheterize the celiac axis. Selective celiac arteriography was performed. The Cobra catheter was then used to selectively catheterize the superior mesenteric artery. Selective arteriography was performed with injection of the main SMA trunk. Arteriography was performed in multiple projections including after administration of intra-arterial nitroglycerin. The Cobra catheter was then used to catheterize the right colic artery and sub selective right colic arteriography was performed. The catheter was then used to selectively catheterize the inferior mesenteric artery and selective arteriography performed with visualization of the entire IMA supply. After the procedure catheter and sheath were removed and hemostasis obtained utilizing the Cordis ExoSeal device. FINDINGS: Celiac arteriography demonstrates normal branch vessel patency and anatomy with no evidence of arterial abnormality or bleeding source. SMA arteriography demonstrates normally patent artery supplying small bowel and the ascending and transverse colon without evidence of active bleeding, pseudoaneurysm, av malformation or vasculitis. Post nitroglycerin arteriography did not reveal any bleeding. Additional sub selective right colic arteriography demonstrates no evidence of active bleeding or arterial abnormality. IMA arteriography demonstrates normal arterial supply to the descending colon, sigmoid colon and rectum without evidence of bleeding source, arterial abnormality or AV malformation. IMPRESSION: Unremarkable 3 vessel selective mesenteric arteriography and sub selective right colic arteriography demonstrating no active bleeding into the gastrointestinal tract, arterial abnormality or AV malformation. Electronically Signed   By: Aletta Edouard M.D.   On: 04/02/2019 10:53   . sodium chloride        Assessment/Plan Confusion Chronic  systolic/diastolic CHF -EF [id number], grade 1 diastolic dysfunction -[id number] Type 2 diabetes non-insulin-dependent Hx CVA -Remote lacunar infarct involving the right basal ganglia 04/2016  - on Plavix  None since admit 03/31/19 Hx seizures Restless leg syndrome DNR  Lower GI bleed -recurrent diverticular bleeds/etiology uncertain Anemia -transfused with 6 units total this admission so far.  FEN: IV fluids/n.p.o. except sips with meds ID: None: DVT: SCDs added Follow-up: TBD  Plan: We have been asked to see and  follow along with the patient.  Dr. Eugenia Pancoast has given the patient several options; 1 is just watch and transfuse.  2. Do a colonoscopy was possible intervention during his colonoscopy. 3.  Possible surgical resection.   Currently we do not have an identifiable location for his bleeding.  Colonoscopy as noted above showed diverticular disease on the left side.  Bleeding scan showed bleeding on the right side, angiogram did not show any active bleeding at all.  If we do not have a confirmed location for the bleeding, and surgery was necessary this would might require a full colectomy, with possible ileostomy.  There are significant differences between a partial colectomy vs full colectomy/ileostomy. Patient also needs Plavix because of his history of stroke.    Carotid Doppler showed some stenosis with good antegrade flow 08/12/2016.  His last echo cardiogram 02/24/2016, showed EF of 45% with stage I diastolic dysfunction.  If the patient needs a surgical  approach he will most likely require medicine/cardiology clearance.  We will follow along and await results of colonoscopy and Dr. Ardis Hughs recommendations.    Earnstine Regal [facility] Surgery 04/03/2019, 9:46 AM Please see Amion for pager number during day hours 7:00am-4:30pm

## 2019-04-03 NOTE — Progress Notes (Signed)
Informed consent obtained for colonoscopy.

## 2019-04-04 ENCOUNTER — Encounter (HOSPITAL_COMMUNITY)
Admission: EM | Disposition: A | Discharge status: Home / Self Care | Payer: Self-pay | Source: Home / Self Care | Attending: Internal Medicine

## 2019-04-04 ENCOUNTER — Inpatient Hospital Stay (HOSPITAL_COMMUNITY): Payer: Medicare Other | Admitting: Certified Registered"

## 2019-04-04 ENCOUNTER — Encounter (HOSPITAL_COMMUNITY): Payer: Self-pay | Admitting: Internal Medicine

## 2019-04-04 DIAGNOSIS — K2100 Gastro-esophageal reflux disease with esophagitis, without bleeding: Secondary | ICD-10-CM

## 2019-04-04 DIAGNOSIS — K922 Gastrointestinal hemorrhage, unspecified: Secondary | ICD-10-CM

## 2019-04-04 DIAGNOSIS — K222 Esophageal obstruction: Secondary | ICD-10-CM

## 2019-04-04 HISTORY — PX: COLONOSCOPY WITH PROPOFOL: SHX5780

## 2019-04-04 LAB — BPAM RBC
Blood Product Expiration Date: 202103112359
Blood Product Expiration Date: 202103142359
Blood Product Expiration Date: 202103142359
Blood Product Expiration Date: 202103142359
Blood Product Expiration Date: 202103142359
ISSUE DATE / TIME: 202102130011
ISSUE DATE / TIME: 202102130623
ISSUE DATE / TIME: 202102131722
ISSUE DATE / TIME: 202102150641
ISSUE DATE / TIME: 202102151247
Unit Type and Rh: 5100
Unit Type and Rh: 5100
Unit Type and Rh: 5100
Unit Type and Rh: 5100
Unit Type and Rh: 5100

## 2019-04-04 LAB — CBC
HCT: 23.3 % — ABNORMAL LOW (ref 39.0–52.0)
Hemoglobin: 7.3 g/dL — ABNORMAL LOW (ref 13.0–17.0)
MCH: 27.3 pg (ref 26.0–34.0)
MCHC: 31.3 g/dL (ref 30.0–36.0)
MCV: 87.3 fL (ref 80.0–100.0)
Platelets: 247 10*3/uL (ref 150–400)
RBC: 2.67 MIL/uL — ABNORMAL LOW (ref 4.22–5.81)
RDW: 16.4 % — ABNORMAL HIGH (ref 11.5–15.5)
WBC: 8.8 10*3/uL (ref 4.0–10.5)
nRBC: 0.5 % — ABNORMAL HIGH (ref 0.0–0.2)

## 2019-04-04 LAB — MAGNESIUM: Magnesium: 1.9 mg/dL (ref 1.7–2.4)

## 2019-04-04 LAB — TYPE AND SCREEN
ABO/RH(D): O POS
Antibody Screen: NEGATIVE
Unit division: 0
Unit division: 0
Unit division: 0
Unit division: 0
Unit division: 0

## 2019-04-04 LAB — GLUCOSE, CAPILLARY
Glucose-Capillary: 95 mg/dL (ref 70–99)
Glucose-Capillary: 98 mg/dL (ref 70–99)
Glucose-Capillary: 146 mg/dL — ABNORMAL HIGH (ref 70–99)
Glucose-Capillary: 140 mg/dL — ABNORMAL HIGH (ref 70–99)
Glucose-Capillary: 119 mg/dL — ABNORMAL HIGH (ref 70–99)

## 2019-04-04 LAB — BASIC METABOLIC PANEL
Anion gap: 8 (ref 5–15)
BUN: 16 mg/dL (ref 8–23)
CO2: 24 mmol/L (ref 22–32)
Calcium: 7.6 mg/dL — ABNORMAL LOW (ref 8.9–10.3)
Chloride: 104 mmol/L (ref 98–111)
Creatinine, Ser: 0.71 mg/dL (ref 0.61–1.24)
GFR calc Af Amer: >60 mL/min (ref >60)
GFR calc non Af Amer: >60 mL/min (ref >60)
Glucose, Bld: 104 mg/dL — ABNORMAL HIGH (ref 70–99)
Potassium: 3.3 mmol/L — ABNORMAL LOW (ref 3.5–5.1)
Sodium: 136 mmol/L (ref 135–145)

## 2019-04-04 LAB — PREPARE RBC (CROSSMATCH)

## 2019-04-04 SURGERY — COLONOSCOPY WITH PROPOFOL
Anesthesia: Monitor Anesthesia Care

## 2019-04-04 MED ORDER — SODIUM CHLORIDE 0.9% IV SOLUTION
Freq: Once | INTRAVENOUS | Status: AC
  Administered 2019-04-04: 500 mL via INTRAVENOUS

## 2019-04-04 MED ORDER — PROPOFOL 500 MG/50ML IV EMUL
INTRAVENOUS | Status: DC | PRN
  Administered 2019-04-04: 75 ug/kg/min via INTRAVENOUS

## 2019-04-04 MED ORDER — POTASSIUM CHLORIDE CRYS ER 20 MEQ PO TBCR
40.0000 meq | EXTENDED_RELEASE_TABLET | Freq: Once | ORAL | Status: AC
  Administered 2019-04-04: 40 meq via ORAL
  Filled 2019-04-04: qty 2

## 2019-04-04 SURGICAL SUPPLY — 22 items

## 2019-04-04 NOTE — Progress Notes (Signed)
Several orders were 'signed and held' for unclear reasons, including the meckels scan which I ordered 2-3 hours ago.  I 'released' the orders. Hopefully he will get that scan today.

## 2019-04-04 NOTE — Progress Notes (Signed)
PROGRESS NOTE    [name]  [id number] DOB: September 03, 1931 DOA: 03/31/2019 PCP: Seward Carol, MD     Brief Narrative:  [name] is an 86 y.o. male with history of asthma, hyperlipidemia, DM 2, CHF (EF 45%), h/o stroke, GERD, diverticulosis, who presented with chief complaint of rectal bleeding starting about 7:30pm, in toilet bowl and also on toilet paper.  Pt noted a second episode of rectal bleeding later and after stood up had brief episode of syncope. Pt denies presyncopal symptoms. Pt denies any NSAID use. Pt does take plavix.  New events last 24 hours / Subjective: Underwent colonoscopy this morning, found melena but no red blood or recent red blood clots, left diverticulosis. Wife at bedside  Assessment & Plan:   Principal Problem:   Rectal bleeding Active Problems:   Diabetes mellitus type II, non insulin dependent (HCC)   Seizures (HCC)   Rectal bleed   Protein-calorie malnutrition, severe (HCC)   DNR (do not resuscitate)   Rectal bleeding with acute blood loss anemia, symptomatic anemia presenting with syncope -CTA abdomen pelvis 2/13: No evidence of contrast extravasation within the bowel -Tagged RBC scan 2/13: Positive for active GI bleeding, with right colon and suspected source of GI bleeding -Status post mesenteric arteriography by IR 2/13: Celiac, SMA, right colic and IMA arteriography negative for bleed, arterial abnormality or AV malformation. Unable to locate abnormality to embolize. -Status post colonoscopy 2/16: melena, left diverticulosis -Transfused total 5 unit packed red blood cells so far -Hemoglobin 7.3 this morning, getting 1 additional transfusions today 2/16  -Plavix on hold -GI planning for Meckel's scan and if neg, consider UGI work up   History of stroke -Hold Plavix -Continue simvastatin  Chronic systolic and diastolic CHF -Echocardiogram in 2018 with EF [id number], grade 1 diastolic dysfunction -Without clinical exacerbation  -Continue  Coreg  Type 2 diabetes -Hold Metformin while inpatient -Sliding scale insulin  Restless leg syndrome -Continue Requip  Hypokalemia -Replace, trend     DVT prophylaxis: SCD Code Status: DNR Family Communication: Wife at bedside Disposition Plan:  . Patient is from home, independent living at [facility] prior to admission. . Currently in-hospital treatment needed due to GI bleeding and symptomatic anemia. . Suspect patient will discharge to independent living facility in 2-3 days pending stabilization of GI bleeding    Consultants:   GI  IR  General surgery    Antimicrobials:  Anti-infectives (From admission, onward)   None       Objective: Vitals:   04/04/19 1026 04/04/19 1041 04/04/19 1055 04/04/19 1145  BP: 129/60 (!) 124/56 119/65 137/72  Pulse:  71 69 71  Resp: 18 17 18 19   Temp: 98.7 F (37.1 C) 98.9 F (37.2 C)    TempSrc: Oral Oral    SpO2:  98% 99% 98%  Weight:      Height:        Intake/Output Summary (Last 24 hours) at 04/04/2019 1253 Last data filed at 04/04/2019 0943 Gross per 24 hour  Intake 715 ml  Output 750 ml  Net -35 ml   Filed Weights   04/01/19 0155 04/02/19 0401 04/03/19 0347  Weight: 69.9 kg 74.8 kg 74.8 kg    Examination: General exam: Resting comfortably  Respiratory system: Clear to auscultation. Respiratory effort normal. Cardiovascular system: S1 & S2 heard, RRR. No pedal edema. Gastrointestinal system: Abdomen is nondistended, soft and nontender.  Extremities: Symmetric in appearance bilaterally  Skin: No rashes, lesions or ulcers on exposed skin  Data Reviewed: I have personally reviewed following labs and imaging studies  CBC: Recent Labs  Lab 03/31/19 2252 03/31/19 2252 04/01/19 1114 04/01/19 1114 04/01/19 1652 04/02/19 0327 04/03/19 0345 04/03/19 1850 04/04/19 0201  WBC 13.1*   < > 10.2  --  12.7* 15.6* 9.9  --  8.8  NEUTROABS 10.2*  --   --   --   --   --   --   --   --   HGB 8.2*   < > 8.0*    < > 7.0* 7.6* 6.2* 8.7* 7.3*  HCT 27.3*   < > 25.1*   < > 22.5* 24.2* 19.4* 27.6* 23.3*  MCV 87.5   < > 85.1  --  85.9 86.4 87.0  --  87.3  PLT 439*   < > 313  --  306 284 248  --  247   < > = values in this interval not displayed.   Basic Metabolic Panel: Recent Labs  Lab 03/31/19 2252 04/01/19 0521 04/02/19 0327 04/03/19 0345 04/04/19 0201  NA 137 136 141 139 136  K 4.5 4.2 3.8 3.4* 3.3*  CL 107 102 104 106 104  CO2 23 26 27 26 24   GLUCOSE 152* 157* 147* 106* 104*  BUN 16 18 33* 20 16  CREATININE 0.85 0.80 0.80 0.68 0.71  CALCIUM 8.1* 7.6* 8.1* 7.7* 7.6*  MG  --   --   --   --  1.9   GFR: Estimated Creatinine Clearance: 69.7 mL/min (by C-G formula based on SCr of 0.71 mg/dL). Liver Function Tests: Recent Labs  Lab 03/31/19 2252 04/01/19 0521  AST 15 13*  ALT 11 10  ALKPHOS 47 39  BILITOT 0.4 0.6  PROT 5.3* 4.5*  ALBUMIN 3.0* 2.5*   No results for input(s): LIPASE, AMYLASE in the last 168 hours. No results for input(s): AMMONIA in the last 168 hours. Coagulation Profile: No results for input(s): INR, PROTIME in the last 168 hours. Cardiac Enzymes: No results for input(s): CKTOTAL, CKMB, CKMBINDEX, TROPONINI in the last 168 hours. BNP (last 3 results) No results for input(s): PROBNP in the last 8760 hours. HbA1C: No results for input(s): HGBA1C in the last 72 hours. CBG: Recent Labs  Lab 04/03/19 1635 04/03/19 1934 04/03/19 2338 04/04/19 0318 04/04/19 1143  GLUCAP 100* 157* 98 95 98   Lipid Profile: No results for input(s): CHOL, HDL, LDLCALC, TRIG, CHOLHDL, LDLDIRECT in the last 72 hours. Thyroid Function Tests: No results for input(s): TSH, T4TOTAL, FREET4, T3FREE, THYROIDAB in the last 72 hours. Anemia Panel: No results for input(s): VITAMINB12, FOLATE, FERRITIN, TIBC, IRON, RETICCTPCT in the last 72 hours. Sepsis Labs: No results for input(s): PROCALCITON, LATICACIDVEN in the last 168 hours.  Recent Results (from the past 240 hour(s))  SARS  CORONAVIRUS 2 (TAT 6-24 HRS) Nasopharyngeal Nasopharyngeal Swab     Status: None   Collection Time: 04/01/19 12:46 AM   Specimen: Nasopharyngeal Swab  Result Value Ref Range Status   SARS Coronavirus 2 NEGATIVE NEGATIVE Final    Comment: (NOTE) SARS-CoV-2 target nucleic acids are NOT DETECTED. The SARS-CoV-2 RNA is generally detectable in upper and lower respiratory specimens during the acute phase of infection. Negative results do not preclude SARS-CoV-2 infection, do not rule out co-infections with other pathogens, and should not be used as the sole basis for treatment or other patient management decisions. Negative results must be combined with clinical observations, patient history, and epidemiological information. The expected result is Negative. Fact Sheet  for Patients: SugarRoll.be Fact Sheet for Healthcare Providers: https://www.woods-mathews.com/ This test is not yet approved or cleared by the [place] FDA and  has been authorized for detection and/or diagnosis of SARS-CoV-2 by FDA under an Emergency Use Authorization (EUA). This EUA will remain  in effect (meaning this test can be used) for the duration of the COVID-19 declaration under Section 56 4(b)(1) of the Act, 21 U.S.C. section 360bbb-3(b)(1), unless the authorization is terminated or revoked sooner. Performed at [facility], [place] [address] [place], [place] [address]       Radiology Studies: No results found.    Scheduled Meds: . budesonide  2 mL Nebulization BID  . carvedilol  3.125 mg Oral BID WC  . fluticasone  1 spray Each Nare Daily  . insulin aspart  0-9 Units Subcutaneous Q4H  . Melatonin  9 mg Oral QHS  . montelukast  10 mg Oral QHS  . rOPINIRole  2 mg Oral QHS  . simvastatin  40 mg Oral QHS  . sodium chloride flush  3 mL Intravenous Q12H   Continuous Infusions: . sodium chloride       LOS: 3 days      Time spent: 20 minutes    Dessa Phi, DO [facility] 04/04/2019, 12:53 PM   Available via Epic secure chat 7am-7pm After these hours, please refer to coverage provider listed on [email or web address]

## 2019-04-04 NOTE — Anesthesia Preprocedure Evaluation (Signed)
Anesthesia Evaluation  Patient identified by MRN, date of birth, ID band Patient awake    Airway Mallampati: II  TM Distance: >3 FB Neck ROM: Full    Dental no notable dental hx.    Pulmonary asthma , COPD,    Pulmonary exam normal breath sounds clear to auscultation       Cardiovascular hypertension, negative cardio ROS Normal cardiovascular exam Rhythm:Regular Rate:Normal     Neuro/Psych    GI/Hepatic Neg liver ROS, hiatal hernia, GERD  ,  Endo/Other  diabetes  Renal/GU negative Renal ROS     Musculoskeletal   Abdominal   Peds  Hematology   Anesthesia Other Findings   Reproductive/Obstetrics                             Anesthesia Physical  Anesthesia Plan  ASA: III  Anesthesia Plan: MAC   Post-op Pain Management:    Induction: Intravenous  PONV Risk Score and Plan: 1 and Ondansetron and Treatment may vary due to age or medical condition  Airway Management Planned: Nasal Cannula  Additional Equipment:   Intra-op Plan:   Post-operative Plan:   Informed Consent: I have reviewed the patients History and Physical, chart, labs and discussed the procedure including the risks, benefits and alternatives for the proposed anesthesia with the patient or authorized representative who has indicated his/her understanding and acceptance.       Plan Discussed with: CRNA and Anesthesiologist  Anesthesia Plan Comments:         Anesthesia Quick Evaluation

## 2019-04-04 NOTE — Op Note (Signed)
[facility] Patient Name: [name] Procedure Date : 04/04/2019 MRN: [id number] Attending MD: Milus Banister , MD Date of Birth: 21-Jan-1932 CSN: [id number] Age: 86 Admit Type: Inpatient Procedure:                Colonoscopy Indications:              Rectal bleeding, overt, obscure Providers:                Milus Banister, MD, Jeanella Cara, RN,                            Cherylynn Ridges, Technician Eugenia Mcalpine CRNA Referring MD:              Medicines:                Monitored Anesthesia Care Complications:            No immediate complications. Estimated blood loss:                            None. Estimated Blood Loss:     Estimated blood loss: none. Procedure:                Pre-Anesthesia Assessment:                           - Prior to the procedure, a History and Physical                            was performed, and patient medications and                            allergies were reviewed. The patient's tolerance of                            previous anesthesia was also reviewed. The risks                            and benefits of the procedure and the sedation                            options and risks were discussed with the patient.                            All questions were answered, and informed consent                            was obtained. Prior Anticoagulants: The patient has                            taken Plavix (clopidogrel), last dose was 4 days                            prior to procedure. ASA Grade Assessment: IV - A  patient with severe systemic disease that is a                            constant threat to life. After reviewing the risks                            and benefits, the patient was deemed in                            satisfactory condition to undergo the procedure.                           After obtaining informed consent, the colonoscope                            was passed under  direct vision. Throughout the                            procedure, the patient's blood pressure, pulse, and                            oxygen saturations were monitored continuously. The                            [id number]) Olympus colonoscope was                            introduced through the anus and advanced to the the                            cecum, identified by the ileocecal valve. The                            colonoscopy was performed without difficulty. The                            patient tolerated the procedure well. The quality                            of the bowel preparation was adequate. The                            ileocecal valve and the rectum were photographed. Scope In: 9:22:23 AM Scope Out: 9:46:39 AM Scope Withdrawal Time: 0 hours 5 minutes 2 seconds  Total Procedure Duration: 0 hours 24 minutes 16 seconds  Findings:      Dark, liquid stool (melenic) throughout the colon. No red blood or       recent red blood clots.      Multiple small and large-mouthed diverticula were found in the left       colon.      The exam was otherwise without abnormality on direct and retroflexion       views. I was able to view the majority of the cecum but not the AO. Impression:               -  Dark, liquid stool that probably represents                            melena. No red blood or recent red blood clots.                           - Extensive left colon diverticulosis.                           - His presentation has always seems to be from a                            lower GI source, presumed divertcular. This                            admission is similar with normal BUN/Cr however                            after this examination I has some doubt about him                            having a colon source of bleeding. Nuc med bleeding                            scans have varied twice (left colon, right colon)                            and the 2020  Nuc scan suggested "hyperemia in the                            LEFT mid abdomen either at small bowel or the                            distal descending colon". He has not had EGD in 10                            years. I am going to order a Meckels scan today and                            if negative then will need to consider UGI workup. Recommendation:           - Meckel's scan today, I will order. Procedure Code(s):        --- Professional ---                           [phone], Colonoscopy, flexible; diagnostic, including                            collection of specimen(s) by brushing or washing,                            when performed (separate procedure) Diagnosis Code(s):        ---  Professional ---                           K62.5, Hemorrhage of anus and rectum                           K57.30, Diverticulosis of large intestine without                            perforation or abscess without bleeding CPT copyright 2019 American Medical Association. All rights reserved. The codes documented in this report are preliminary and upon coder review may  be revised to meet current compliance requirements. Milus Banister, MD 04/04/2019 10:03:20 AM This report has been signed electronically. Number of Addenda: 0

## 2019-04-04 NOTE — Transfer of Care (Signed)
Immediate Anesthesia Transfer of Care Note  Patient: [name]  Procedure(s) Performed: COLONOSCOPY WITH PROPOFOL (N/A )  Patient Location: Endoscopy Unit  Anesthesia Type:MAC  Level of Consciousness: awake, alert  and oriented  Airway & Oxygen Therapy: Patient Spontanous Breathing and Patient connected to nasal cannula oxygen  Post-op Assessment: Report given to RN and Post -op Vital signs reviewed and stable  Post vital signs: Reviewed and stable  Last Vitals:  Vitals Value Taken Time  BP 113/61   Temp 99.0   Pulse 72   Resp 20 04/04/19 0957  SpO2 100   Vitals shown include unvalidated device data.  Last Pain:  Vitals:   04/04/19 0803  TempSrc: Oral  PainSc:          Complications: No apparent anesthesia complications

## 2019-04-04 NOTE — Anesthesia Postprocedure Evaluation (Signed)
Anesthesia Post Note  Patient: [name]  Procedure(s) Performed: COLONOSCOPY WITH PROPOFOL (N/A )     Patient location during evaluation: Endoscopy Anesthesia Type: MAC Level of consciousness: awake and alert Pain management: pain level controlled Vital Signs Assessment: post-procedure vital signs reviewed and stable Respiratory status: spontaneous breathing, nonlabored ventilation and respiratory function stable Cardiovascular status: blood pressure returned to baseline and stable Postop Assessment: no apparent nausea or vomiting Anesthetic complications: no    Last Vitals:  Vitals:   04/04/19 0957 04/04/19 1026  BP: 113/61 129/60  Pulse: 77   Resp: (!) 21 18  Temp: 37.2 C 37.1 C  SpO2: 100%     Last Pain:  Vitals:   04/04/19 1026  TempSrc: Oral  PainSc:                  Lynda Rainwater

## 2019-04-04 NOTE — Progress Notes (Signed)
Transfusion complete

## 2019-04-04 NOTE — H&P (View-Only) (Signed)
Several orders were 'signed and held' for unclear reasons, including the meckels scan which I ordered 2-3 hours ago.  I 'released' the orders. Hopefully he will get that scan today.

## 2019-04-04 NOTE — TOC Initial Note (Signed)
Transition of Care [facility]) - Initial/Assessment Note    Patient Details  Name: [name] MRN: [id number] Date of Birth: [birth year]  [facility] CM/SW Contact:    Carles Collet, RN Phone Number: 04/04/2019, 4:11 PM  Clinical Narrative:          Spoke w patient at the bedside. He states that he is from [place].  He is agreeable to HH/ OP PT through [facility]. I spoke w [place] who confirmed that he is from the [place] portion. I spoke with his wife who agrees PT after DC would be good, and she too would like it through [facility]. She provided me with the name and number to contact. I left a voicemail requesting a callback. [name] [phone]  Patient will need HH orders.           Expected Discharge Plan: [place] Barriers to Discharge: Continued Medical Work up   Patient Goals and CMS Choice Patient states their goals for this hospitalization and ongoing recovery are:: to go home CMS Medicare.gov Compare Post Acute Care list provided to:: Patient Choice offered to / list presented to : Patient  Expected Discharge Plan and Services Expected Discharge Plan: [place]   Discharge Planning Services: CM Consult                                          Prior Living Arrangements/Services                       Activities of Daily Living Home Assistive Devices/Equipment: Wheelchair, Environmental consultant (specify type), Eyeglasses, Grab bars in shower, Grab bars around toilet ADL Screening (condition at time of admission) Patient's cognitive ability adequate to safely complete daily activities?: Yes Is the patient deaf or have difficulty hearing?: No Does the patient have difficulty seeing, even when wearing glasses/contacts?: No Does the patient have difficulty concentrating, remembering, or making decisions?: No Patient able to express need for assistance with ADLs?: Yes Does the patient have  difficulty dressing or bathing?: No Independently performs ADLs?: Yes (appropriate for developmental age) Does the patient have difficulty walking or climbing stairs?: Yes Weakness of Legs: Both Weakness of Arms/Hands: None  Permission Sought/Granted                  Emotional Assessment              Admission diagnosis:  Rectal bleeding [K62.5] Lower GI bleed [K92.2] Symptomatic anemia [D64.9] Syncope, unspecified syncope type [R55] Patient Active Problem List   Diagnosis Date Noted  . Protein-calorie malnutrition, severe ([place]) 04/01/2019  . DNR (do not resuscitate) 04/01/2019  . Rectal bleed 08/21/2018  . Platelet inhibition due to Plavix   . Rectal bleeding   . Diverticulosis large intestine w/o perforation or abscess w/bleeding   . GERD (gastroesophageal reflux disease) 04/21/2018  . Hematochezia   . Hypokalemia 12/25/2017  . Diverticulosis   . Acute blood loss anemia 12/24/2017  . Syncope due to orthostatic hypotension 12/23/2017  . Chronic combined systolic and diastolic CHF (congestive heart failure) ([place]) 12/23/2017  . GI bleed 10/25/2016  . BPPV (benign paroxysmal positional vertigo) 10/25/2016  . Lower GI bleed 10/24/2016  . Syncope 10/24/2016  . TIA (transient ischemic attack) 07/22/2016  . Benign essential HTN   . PAF (paroxysmal atrial fibrillation) ([place])   .  Stroke-like symptom 02/22/2016  . Leukocytosis 02/22/2016  . Acute lower GI bleeding 02/01/2016  . History of lower GI bleeding Dec 2017 02/01/2016  . Chronic anticoagulation 2/2 history of CVA 01/31/2016  . History of CVA (cerebrovascular accident) 01/31/2016  . Sinusitis, chronic 12/12/2015  . Cough variant asthma 10/31/2015  . Insomnia 07/25/2015  . HLD (hyperlipidemia) 02/03/2014  . Ulnar neuropathy at elbow of right upper extremity 02/03/2014  . Cervical radiculopathy 02/03/2014  . Seizures ([place])   . Diabetes mellitus type II, non insulin dependent ([place]) 12/22/2013  . Recurrent  ventral hernia 12/15/2011  . History of esophageal strciture   . Diverticulosis of colon with hemorrhage 06/24/2007   PCP:  Seward Carol, MD Pharmacy:   [facility], [place] - [address] [place] [address] Phone: [phone] Fax: [phone]     Social Determinants of Health (SDOH) Interventions    Readmission Risk Interventions No flowsheet data found.

## 2019-04-04 NOTE — Interval H&P Note (Signed)
History and Physical Interval Note:  04/04/2019 8:21 AM  [name]  has presented today for surgery, with the diagnosis of Recurrent lower intestinal bleeding.  Anemia..  The various methods of treatment have been discussed with the patient and family. After consideration of risks, benefits and other options for treatment, the patient has consented to  Procedure(s): COLONOSCOPY WITH PROPOFOL (N/A) as a surgical intervention.  The patient's history has been reviewed, patient examined, no change in status, stable for surgery.  I have reviewed the patient's chart and labs.  Questions were answered to the patient's satisfaction.     Milus Banister

## 2019-04-05 ENCOUNTER — Other Ambulatory Visit: Payer: Self-pay | Admitting: Physician Assistant

## 2019-04-05 ENCOUNTER — Inpatient Hospital Stay (HOSPITAL_COMMUNITY): Payer: Medicare Other | Admitting: Anesthesiology

## 2019-04-05 ENCOUNTER — Encounter (HOSPITAL_COMMUNITY): Payer: Self-pay | Admitting: Internal Medicine

## 2019-04-05 ENCOUNTER — Inpatient Hospital Stay (HOSPITAL_COMMUNITY): Payer: Medicare Other

## 2019-04-05 ENCOUNTER — Encounter (HOSPITAL_COMMUNITY)
Admission: EM | Disposition: A | Discharge status: Home / Self Care | Payer: Self-pay | Source: Home / Self Care | Attending: Internal Medicine

## 2019-04-05 DIAGNOSIS — K2970 Gastritis, unspecified, without bleeding: Secondary | ICD-10-CM

## 2019-04-05 HISTORY — PX: ESOPHAGOGASTRODUODENOSCOPY (EGD) WITH PROPOFOL: SHX5813

## 2019-04-05 HISTORY — PX: BIOPSY: SHX5522

## 2019-04-05 LAB — BPAM RBC
Blood Product Expiration Date: 202103162359
ISSUE DATE / TIME: 202102161013
Unit Type and Rh: 5100

## 2019-04-05 LAB — BASIC METABOLIC PANEL
Anion gap: 7 (ref 5–15)
BUN: 11 mg/dL (ref 8–23)
CO2: 24 mmol/L (ref 22–32)
Calcium: 7.8 mg/dL — ABNORMAL LOW (ref 8.9–10.3)
Chloride: 105 mmol/L (ref 98–111)
Creatinine, Ser: 0.72 mg/dL (ref 0.61–1.24)
GFR calc Af Amer: >60 mL/min (ref >60)
GFR calc non Af Amer: >60 mL/min (ref >60)
Glucose, Bld: 97 mg/dL (ref 70–99)
Potassium: 3.7 mmol/L (ref 3.5–5.1)
Sodium: 136 mmol/L (ref 135–145)

## 2019-04-05 LAB — CBC
HCT: 25.9 % — ABNORMAL LOW (ref 39.0–52.0)
Hemoglobin: 8.2 g/dL — ABNORMAL LOW (ref 13.0–17.0)
MCH: 27.4 pg (ref 26.0–34.0)
MCHC: 31.7 g/dL (ref 30.0–36.0)
MCV: 86.6 fL (ref 80.0–100.0)
Platelets: 228 10*3/uL (ref 150–400)
RBC: 2.99 MIL/uL — ABNORMAL LOW (ref 4.22–5.81)
RDW: 16.3 % — ABNORMAL HIGH (ref 11.5–15.5)
WBC: 8.8 10*3/uL (ref 4.0–10.5)
nRBC: 0.7 % — ABNORMAL HIGH (ref 0.0–0.2)

## 2019-04-05 LAB — GLUCOSE, CAPILLARY
Glucose-Capillary: 99 mg/dL (ref 70–99)
Glucose-Capillary: 99 mg/dL (ref 70–99)
Glucose-Capillary: 134 mg/dL — ABNORMAL HIGH (ref 70–99)
Glucose-Capillary: 100 mg/dL — ABNORMAL HIGH (ref 70–99)

## 2019-04-05 LAB — TYPE AND SCREEN
ABO/RH(D): O POS
Antibody Screen: NEGATIVE
Unit division: 0

## 2019-04-05 LAB — MAGNESIUM: Magnesium: 2 mg/dL (ref 1.7–2.4)

## 2019-04-05 SURGERY — ESOPHAGOGASTRODUODENOSCOPY (EGD) WITH PROPOFOL
Anesthesia: Monitor Anesthesia Care

## 2019-04-05 MED ORDER — PROPOFOL 500 MG/50ML IV EMUL
INTRAVENOUS | Status: DC | PRN
  Administered 2019-04-05: 75 ug/kg/min via INTRAVENOUS

## 2019-04-05 MED ORDER — SODIUM PERTECHNETATE TC 99M INJECTION
10.0000 | Freq: Once | INTRAVENOUS | Status: AC | PRN
  Administered 2019-04-05: 10 via INTRAVENOUS

## 2019-04-05 MED ORDER — PANTOPRAZOLE SODIUM 40 MG PO TBEC
40.0000 mg | DELAYED_RELEASE_TABLET | Freq: Two times a day (BID) | ORAL | Status: DC
  Administered 2019-04-05: 40 mg via ORAL
  Filled 2019-04-05: qty 1

## 2019-04-05 MED ORDER — PANTOPRAZOLE SODIUM 40 MG PO TBEC: 40.0000 mg | DELAYED_RELEASE_TABLET | Freq: Two times a day (BID) | ORAL | 0 refills | Status: DC

## 2019-04-05 MED ORDER — LACTATED RINGERS IV SOLN: INTRAVENOUS | Status: DC | PRN

## 2019-04-05 MED ORDER — TROPICAMIDE 1 % OP SOLN: 1.0000 [drp] | Freq: Once | OPHTHALMIC | Status: DC

## 2019-04-05 SURGICAL SUPPLY — 15 items

## 2019-04-05 NOTE — Care Management (Addendum)
Please write in comments of [place] order "may substitute with outpatient therapies."  Fax [place] orders and demographics to [place], [place]- [phone] Ph- [phone]

## 2019-04-05 NOTE — Anesthesia Preprocedure Evaluation (Addendum)
Anesthesia Evaluation  Patient identified by MRN, date of birth, ID band Patient awake    Reviewed: Allergy & Precautions, NPO status , Patient's Chart, lab work & pertinent test results  History of Anesthesia Complications Negative for: history of anesthetic complications  Airway Mallampati: II  TM Distance: >3 FB Neck ROM: Full    Dental  (+) Lower Dentures, Missing   Pulmonary asthma ,    Pulmonary exam normal        Cardiovascular hypertension, Pt. on medications and Pt. on home beta blockers +CHF  Normal cardiovascular exam   '18 TTE - EF 45%. Grade 1 diastolic dysfunction.     Neuro/Psych neg Seizures TIACVA, Residual Symptoms negative psych ROS   GI/Hepatic Neg liver ROS, hiatal hernia, GERD  Controlled,  Endo/Other  diabetes, Type 2, Oral Hypoglycemic Agents  Renal/GU negative Renal ROS     Musculoskeletal  (+) Arthritis ,   Abdominal   Peds  Hematology  (+) anemia ,  S/p splenectomy    Anesthesia Other Findings Covid neg 2/13   Reproductive/Obstetrics                            Anesthesia Physical Anesthesia Plan  ASA: III  Anesthesia Plan: MAC   Post-op Pain Management:    Induction: Intravenous  PONV Risk Score and Plan: 1 and Propofol infusion and Treatment may vary due to age or medical condition  Airway Management Planned: Nasal Cannula and Natural Airway  Additional Equipment: None  Intra-op Plan:   Post-operative Plan:   Informed Consent: I have reviewed the patients History and Physical, chart, labs and discussed the procedure including the risks, benefits and alternatives for the proposed anesthesia with the patient or authorized representative who has indicated his/her understanding and acceptance.   Patient has DNR.  Suspend DNR and Discussed DNR with patient.     Plan Discussed with: CRNA and Anesthesiologist  Anesthesia Plan Comments:        Anesthesia Quick Evaluation

## 2019-04-05 NOTE — Progress Notes (Signed)
Following along Reviewed c-scope finding and pictures Will await Meckel's scan and further UGI workup

## 2019-04-05 NOTE — Anesthesia Postprocedure Evaluation (Signed)
Anesthesia Post Note  Patient: [name]  Procedure(s) Performed: ESOPHAGOGASTRODUODENOSCOPY (EGD) WITH PROPOFOL (N/A ) BIOPSY     Patient location during evaluation: PACU Anesthesia Type: MAC Level of consciousness: awake and alert Pain management: pain level controlled Vital Signs Assessment: post-procedure vital signs reviewed and stable Respiratory status: spontaneous breathing, nonlabored ventilation and respiratory function stable Cardiovascular status: stable and blood pressure returned to baseline Anesthetic complications: no    Last Vitals:  Vitals:   04/05/19 1553 04/05/19 1608  BP: (!) 149/61 (!) 148/76  Pulse: 69 71  Resp: 17 (!) 22  Temp:  36.7 C  SpO2: 100% 98%    Last Pain:  Vitals:   04/05/19 1608  TempSrc: Oral  PainSc:                  Audry Pili

## 2019-04-05 NOTE — Progress Notes (Addendum)
Daily Rounding Note  04/05/2019, 8:54 AM  LOS: 4 days   SUBJECTIVE:   Chief complaint:   Occult GI bleed, painless hematochezia. Patient just returned from the medical scans.  He has not had any bowel movements this morning.  He really wants to go home but his wife is reluctant to take him home since he has not been out of bed since he was admitted on 2/13.  Normally he ambulates with a walker. meckels scan is negative this AM   OBJECTIVE:         Vital signs in last 24 hours:    Temp:  [97.7 F (36.5 C)-99 F (37.2 C)] 97.7 F (36.5 C) (02/17 0715) Pulse Rate:  [63-85] 63 (02/17 0715) Resp:  [17-26] 17 (02/17 0715) BP: (97-143)/(56-93) 97/61 (02/17 0715) SpO2:  [96 %-100 %] 97 % (02/17 0715) Last BM Date: 04/04/19 Filed Weights   04/01/19 0155 04/02/19 0401 04/03/19 0347  Weight: 69.9 kg 74.8 kg 74.8 kg   General: Frail, alert, comfortable.  No acute distress and not acutely ill-appearing Heart: RRR Chest: No labored breathing Abdomen: Soft.  Not tender or distended Extremities: No CCE. Neuro/Psych: Oriented x3.  Appropriate.  No tremors.  Moves all 4 limbs, strength not assessed.  Intake/Output from previous day: 02/16 0701 - 02/17 0700 In: 400 [I.V.:400] Out: 1350 [Urine:1350]  Intake/Output this shift: No intake/output data recorded.  Lab Results: Recent Labs    04/03/19 0345 04/03/19 0345 04/03/19 1850 04/04/19 0201 04/05/19 0401  WBC 9.9  --   --  8.8 8.8  HGB 6.2*   < > 8.7* 7.3* 8.2*  HCT 19.4*   < > 27.6* 23.3* 25.9*  PLT 248  --   --  247 228   < > = values in this interval not displayed.   BMET Recent Labs    04/03/19 0345 04/04/19 0201 04/05/19 0401  NA 139 136 136  K 3.4* 3.3* 3.7  CL 106 104 105  CO2 26 24 24   GLUCOSE 106* 104* 97  BUN 20 16 11   CREATININE 0.68 0.71 0.72  CALCIUM 7.7* 7.6* 7.8*   LFT No results for input(s): PROT, ALBUMIN, AST, ALT, ALKPHOS, BILITOT,  BILIDIR, IBILI in the last 72 hours. PT/INR No results for input(s): LABPROT, INR in the last 72 hours. Hepatitis Panel No results for input(s): HEPBSAG, HCVAB, HEPAIGM, HEPBIGM in the last 72 hours.  Studies/Results: No results found.   Scheduled Meds: . budesonide  2 mL Nebulization BID  . carvedilol  3.125 mg Oral BID WC  . fluticasone  1 spray Each Nare Daily  . insulin aspart  0-9 Units Subcutaneous Q4H  . Melatonin  9 mg Oral QHS  . montelukast  10 mg Oral QHS  . rOPINIRole  2 mg Oral QHS  . simvastatin  40 mg Oral QHS  . sodium chloride flush  3 mL Intravenous Q12H   Continuous Infusions: . sodium chloride     PRN Meds:.sodium chloride, acetaminophen **OR** acetaminophen, acetaminophen **AND** diphenhydrAMINE, albuterol, sodium chloride flush, zolpidem   ASSESMENT:   *    GI bleed, presumed diverticular.  Recurrent, episodes date back to 01/2016 and no definitive source isolated. 11/2016 colonoscopy: Left-sided colonic diverticulosis.  04/01/2019 nuclear med RBC scan: Active bleeding in the right colon. 04/01/2019 CTAP w angio: No active bleeding, unremarkable study. 04/04/2019 colonoscopy: Dark, liquid stool throughout colon.  Probably represents melena, upstream source of bleeding..  No red blood.  Extensive left-sided diverticulosis. 04/05/2019 NM Meckel scan: no Meckel's diverticulum.    *     Blood loss anemia.  S/p 4 PRBCs. Hgb 6.2 >> 8.2.    *   Chronic Plavix.  On hold.  Hx CVA 2015, 2017.  On hold for now.     PLAN   *    EGD planned for this afternoon.  Pt and his wife are agreeable.   Could possibly return to his independent living facility at [facility] after this.  However his wife is concerned that he is too weak to come home just yet.  It will be difficult for him to have home health PT or OT tomorrow given the weather report for ICD hazardous conditions overnight and tomorrow.  *   Recommend PT eval prior to discharge for recommendations re therapies  after discharge and risk assessment for falls.    Azucena Freed  04/05/2019, 8:54 AM Phone [phone]

## 2019-04-05 NOTE — Anesthesia Procedure Notes (Signed)
Procedure Name: MAC Date/Time: 04/05/2019 3:05 PM Performed by: Eligha Bridegroom, CRNA Pre-anesthesia Checklist: Patient identified Patient Re-evaluated:Patient Re-evaluated prior to induction Oxygen Delivery Method: Nasal cannula Preoxygenation: Pre-oxygenation with 100% oxygen Induction Type: IV induction

## 2019-04-05 NOTE — Op Note (Addendum)
[facility] Patient Name: [name] Procedure Date : 04/05/2019 MRN: [id number] Attending MD: Thornton Getty MD, MD Date of Birth: [birth year] CSN: [id number] Age: 86 Admit Type: Inpatient Procedure:                Upper GI endoscopy Indications:              Gastrointestinal bleeding of unknown origin                           GI bleed, presumed diverticular. Recurrent,                            episodes date back to 01/2016 and no definitive                            source isolated.                           11/2016 colonoscopy: Left-sided colonic                            diverticulosis.                           04/01/2019 nuclear med RBC scan: Active bleeding in                            the right colon.                           04/01/2019 CTAP w angio: No active bleeding,                            unremarkable study.                           04/04/2019 colonoscopy: Dark, liquid stool                            throughout colon. Probably represents melena,                            upstream source of bleeding.. No red blood.                            Extensive left-sided diverticulosis.                           04/05/2019 NM Meckel scan: no Meckel's diverticulum. Providers:                Thornton Jarnigan MD, MD, Carlyn Reichert, RN, Marguerita Merles, Technician Referring MD:              Medicines:                Monitored Anesthesia Care Complications:  No immediate complications. Estimated blood loss:                            Minimal. Estimated Blood Loss:     Estimated blood loss was minimal. Procedure:                Pre-Anesthesia Assessment:                           - Prior to the procedure, a History and Physical                            was performed, and patient medications and                            allergies were reviewed. The patient's tolerance of                            previous anesthesia was  also reviewed. The risks                            and benefits of the procedure and the sedation                            options and risks were discussed with the patient.                            All questions were answered, and informed consent                            was obtained. Prior Anticoagulants: The patient has                            taken Plavix (clopidogrel), last dose was 5 days                            prior to procedure. ASA Grade Assessment: III - A                            patient with severe systemic disease. After                            reviewing the risks and benefits, the patient was                            deemed in satisfactory condition to undergo the                            procedure.                           After obtaining informed consent, the endoscope was  passed under direct vision. Throughout the                            procedure, the patient's blood pressure, pulse, and                            oxygen saturations were monitored continuously. The                            [id number]) Olympus gastroscope was                            introduced through the mouth, and advanced to the                            third part of duodenum. The upper GI endoscopy was                            accomplished without difficulty. The patient                            tolerated the procedure well. Scope In: Scope Out: Findings:      LA Grade A (one or more mucosal breaks less than 5 mm, not extending       between tops of 2 mucosal folds) esophagitis with no bleeding was found       in the distal esophagus.      One benign-appearing, intrinsic mild stricture was found 39 cm from the       incisors. This stricture measured less than one cm (in length). The       stenosis was easily traversed with no resistance      Localized moderate inflammation characterized by friability, granularity       and  linear erosions was found in the gastric antrum. There were       scattered erosions in addition to the linear erosions throughout the       gastric body and antrum. Biopsies were taken from the antrum, body, and       fundus with a cold forceps for histology. Estimated blood loss was       minimal.      A medium-sized hiatal hernia was present. No Cameron's erosions present.      The examined duodenum was normal. No blood or evidence for active       bleeding identified.      The exam was otherwise without abnormality. Impression:               - LA Grade A reflux esophagitis with no bleeding.                           - Benign-appearing esophageal stricture.                           - Moderate gastritis. Biopsied.                           - Medium-sized hiatal hernia without Cameron's  erosions.                           - Normal examined duodenum.                           - The examination was otherwise normal. Recommendation:           - Return patient to hospital ward for ongoing care.                           - Advance diet as tolerated today.                           - Continue present medications.                           - Protonix 40 mg BID x 8 weeks.                           - Avoid all NSAIDs.                           - Await pathology results.                           - Resume Plavix (clopidogrel) at prior dose today.                           Results were discussed with the patient's wife by                            telephone. All questions were answered to her                            satisfaction. Procedure Code(s):        --- Professional ---                           [phone], Esophagogastroduodenoscopy, flexible,                            transoral; with biopsy, single or multiple Diagnosis Code(s):        --- Professional ---                           K21.00, Gastro-esophageal reflux disease with                             esophagitis, without bleeding                           K22.2, Esophageal obstruction                           K29.70, Gastritis, unspecified, without bleeding  K44.9, Diaphragmatic hernia without obstruction or                            gangrene                           K92.2, Gastrointestinal hemorrhage, unspecified CPT copyright 2019 American Medical Association. All rights reserved. The codes documented in this report are preliminary and upon coder review may  be revised to meet current compliance requirements. Thornton Merkle MD, MD 04/05/2019 3:40:05 PM This report has been signed electronically. Number of Addenda: 0

## 2019-04-05 NOTE — Discharge Summary (Signed)
Physician Discharge Summary  [name] [id number] DOB: 06-02-1931   PCP: Seward Carol, MD  Admit date: 03/31/2019 Discharge date: 04/05/2019 Length of Stay: 4 days   Code Status: DNR  Admitted From:  Home Discharged to:   [place]: may substitute for outpatient therapies  Equipment/Devices:  None Discharge Condition:  Stable  Recommendations for Outpatient Follow-up   1. Follow up with PCP in 1 week 2. Follow up BMP/CBC   [place] is an 86 y.o.male with history ofasthma, hyperlipidemia, DM 2, CHF (EF 45%), h/o stroke, GERD, diverticulosis,who presented with chief complaint of rectal bleeding starting about 7:30pm, in toilet bowl and also on toilet paper. Pt noted a second episode of rectal bleeding later and after stood up had brief episode of syncope. Pt denies any NSAID use. Pt does take plavix. -CTA abdomen pelvis 2/13: No evidence of contrast extravasation within the bowel -Tagged RBC scan 2/13: Positive for active GI bleeding, with right colon and suspected source of GI bleeding -Status post mesenteric arteriography by IR 2/13: Celiac, SMA, right colic and IMA arteriography negative for bleed, arterial abnormality or AV malformation. Unable to locate abnormality to embolize. -Status post colonoscopy 2/16: melena, left diverticulosis -Transfused total 5 unit packed red blood cells  -Underwent Meckel's scan on 2/17 which was unremarkable and had EGD on 2/17 which showed esophagitis, benign esophageal stricture, moderate gastritis, medium hiatal hernia and was discharged on 8 weeks protonix BID. Advised to avoid NSAIDs and resumed plavix  A & P   Principal Problem:   Rectal bleeding Active Problems:   Diabetes mellitus type II, non insulin dependent (HCC)   Seizures (HCC)   Rectal bleed   Protein-calorie malnutrition, severe (HCC)   DNR (do not resuscitate)    Rectal bleeding with acute blood loss anemia presumed diverticular,  symptomatic anemia presenting with syncope -CTA abdomen pelvis 2/13: No evidence of contrast extravasation within the bowel -Tagged RBC scan 2/13: Positive for active GI bleeding, with right colon and suspected source of GI bleeding -Status post mesenteric arteriography by IR 2/13: Celiac, SMA, right colic and IMA arteriography negative for bleed, arterial abnormality or AV malformation. Unable to locate abnormality to embolize. -Status post colonoscopy 2/16: melena, left diverticulosis -Transfused total 5 unit packed red blood cells so far -Hemoglobin 7.3 this morning, getting 1 additional transfusions today 2/16  -Plavix ok to restart per GI -2/17 Meckels scan unremarkable -2/17 EGD: esophagitis, benign esophageal stricture, moderate gastritis, medium hiatal hernia -Started on protonix 40 mg PO BID x 8 weeks and needs outpatient follow up -CBC ordered in one week -ambulated 100 ft on room air per nursing  History of stroke -restart Plavix -Continue simvastatin  Chronic systolic and diastolic CHF -Echocardiogram in 2018 with EF [id number], grade 1 diastolic dysfunction -Without clinical exacerbation  -Continue Coreg  Type 2 diabetes -continue home meds  Restless leg syndrome -Continue Requip  Hypokalemia -Replace, trend     Consultants  . GI . IR . General Surgery  Procedures  . As above  Antibiotics   Anti-infectives (From admission, onward)   None       Subjective  Patient seen and examined at bedside no acute distress and resting comfortably.  No events overnight.  Tolerating diet post EGD. Ambulating well with nursing, 100 ft on room air. In good spirits and anticipating discharge.   Denies any chest pain, shortness of breath, fever, nausea, vomiting, urinary or bowel complaints. Otherwise ROS negative  Objective   Discharge Exam: Vitals:   04/05/19 1553 04/05/19 1608  BP: (!) 149/61 (!) 148/76  Pulse: 69 71  Resp: 17 (!) 22  Temp:  98 F (36.7 C)   SpO2: 100% 98%   Vitals:   04/05/19 1527 04/05/19 1540 04/05/19 1553 04/05/19 1608  BP: (!) 119/55 137/67 (!) 149/61 (!) 148/76  Pulse: 73 74 69 71  Resp: (!) 21 17 17  (!) 22  Temp: 97.8 F (36.6 C)   98 F (36.7 C)  TempSrc: Oral   Oral  SpO2: 98% 98% 100% 98%  Weight:      Height:        Physical Exam Vitals and nursing note reviewed.  Constitutional:      Appearance: Normal appearance.  HENT:     Head: Normocephalic and atraumatic.  Eyes:     Conjunctiva/sclera: Conjunctivae normal.  Cardiovascular:     Rate and Rhythm: Normal rate and regular rhythm.  Pulmonary:     Effort: Pulmonary effort is normal.     Breath sounds: Normal breath sounds.  Abdominal:     General: Abdomen is flat.     Palpations: Abdomen is soft.  Musculoskeletal:        General: No swelling or tenderness.  Skin:    Coloration: Skin is not jaundiced or pale.  Neurological:     Mental Status: He is alert. Mental status is at baseline.  Psychiatric:        Mood and Affect: Mood normal.        Behavior: Behavior normal.       The results of significant diagnostics from this hospitalization (including imaging, microbiology, ancillary and laboratory) are listed below for reference.     Microbiology: Recent Results (from the past 240 hour(s))  SARS CORONAVIRUS 2 (TAT 6-24 HRS) Nasopharyngeal Nasopharyngeal Swab     Status: None   Collection Time: 04/01/19 12:46 AM   Specimen: Nasopharyngeal Swab  Result Value Ref Range Status   SARS Coronavirus 2 NEGATIVE NEGATIVE Final    Comment: (NOTE) SARS-CoV-2 target nucleic acids are NOT DETECTED. The SARS-CoV-2 RNA is generally detectable in upper and lower respiratory specimens during the acute phase of infection. Negative results do not preclude SARS-CoV-2 infection, do not rule out co-infections with other pathogens, and should not be used as the sole basis for treatment or other patient management decisions. Negative results must be  combined with clinical observations, patient history, and epidemiological information. The expected result is Negative. Fact Sheet for Patients: SugarRoll.be Fact Sheet for Healthcare Providers: https://www.woods-mathews.com/ This test is not yet approved or cleared by the [place] FDA and  has been authorized for detection and/or diagnosis of SARS-CoV-2 by FDA under an Emergency Use Authorization (EUA). This EUA will remain  in effect (meaning this test can be used) for the duration of the COVID-19 declaration under Section 56 4(b)(1) of the Act, 21 U.S.C. section 360bbb-3(b)(1), unless the authorization is terminated or revoked sooner. Performed at [facility], [place] [address], [place],  [address]      Labs: BNP (last 3 results) No results for input(s): BNP in the last 8760 hours. Basic Metabolic Panel: Recent Labs  Lab 04/01/19 0521 04/02/19 0327 04/03/19 0345 04/04/19 0201 04/05/19 0401  NA 136 141 139 136 136  K 4.2 3.8 3.4* 3.3* 3.7  CL 102 104 106 104 105  CO2 26 27 26 24 24   GLUCOSE 157* 147* 106* 104* 97  BUN 18 33* 20 16  11  CREATININE 0.80 0.80 0.68 0.71 0.72  CALCIUM 7.6* 8.1* 7.7* 7.6* 7.8*  MG  --   --   --  1.9 2.0   Liver Function Tests: Recent Labs  Lab 03/31/19 2252 04/01/19 0521  AST 15 13*  ALT 11 10  ALKPHOS 47 39  BILITOT 0.4 0.6  PROT 5.3* 4.5*  ALBUMIN 3.0* 2.5*   No results for input(s): LIPASE, AMYLASE in the last 168 hours. No results for input(s): AMMONIA in the last 168 hours. CBC: Recent Labs  Lab 03/31/19 2252 04/01/19 1114 04/01/19 1652 04/01/19 1652 04/02/19 0327 04/03/19 0345 04/03/19 1850 04/04/19 0201 04/05/19 0401  WBC 13.1*   < > 12.7*  --  15.6* 9.9  --  8.8 8.8  NEUTROABS 10.2*  --   --   --   --   --   --   --   --   HGB 8.2*   < > 7.0*   < > 7.6* 6.2* 8.7* 7.3* 8.2*  HCT 27.3*   < > 22.5*   < > 24.2* 19.4* 27.6* 23.3* 25.9*  MCV 87.5   < > 85.9  --   86.4 87.0  --  87.3 86.6  PLT 439*   < > 306  --  284 248  --  247 228   < > = values in this interval not displayed.   Cardiac Enzymes: No results for input(s): CKTOTAL, CKMB, CKMBINDEX, TROPONINI in the last 168 hours. BNP: Invalid input(s): POCBNP CBG: Recent Labs  Lab 04/04/19 2313 04/05/19 0336 04/05/19 0725 04/05/19 1117 04/05/19 1611  GLUCAP 119* 99 99 134* 100*   D-Dimer No results for input(s): DDIMER in the last 72 hours. Hgb A1c No results for input(s): HGBA1C in the last 72 hours. Lipid Profile No results for input(s): CHOL, HDL, LDLCALC, TRIG, CHOLHDL, LDLDIRECT in the last 72 hours. Thyroid function studies No results for input(s): TSH, T4TOTAL, T3FREE, THYROIDAB in the last 72 hours.  Invalid input(s): FREET3 Anemia work up No results for input(s): VITAMINB12, FOLATE, FERRITIN, TIBC, IRON, RETICCTPCT in the last 72 hours. Urinalysis    Component Value Date/Time   COLORURINE STRAW (A) 04/21/2018 2320   APPEARANCEUR CLEAR 04/21/2018 2320   LABSPEC 1.013 04/21/2018 2320   PHURINE 6.0 04/21/2018 2320   GLUCOSEU NEGATIVE 04/21/2018 2320   HGBUR NEGATIVE 04/21/2018 2320   BILIRUBINUR NEGATIVE 04/21/2018 2320   KETONESUR NEGATIVE 04/21/2018 2320   PROTEINUR NEGATIVE 04/21/2018 2320   UROBILINOGEN 0.2 12/22/2013 1210   NITRITE NEGATIVE 04/21/2018 2320   LEUKOCYTESUR NEGATIVE 04/21/2018 2320   Sepsis Labs Invalid input(s): PROCALCITONIN,  WBC,  LACTICIDVEN Microbiology Recent Results (from the past 240 hour(s))  SARS CORONAVIRUS 2 (TAT 6-24 HRS) Nasopharyngeal Nasopharyngeal Swab     Status: None   Collection Time: 04/01/19 12:46 AM   Specimen: Nasopharyngeal Swab  Result Value Ref Range Status   SARS Coronavirus 2 NEGATIVE NEGATIVE Final    Comment: (NOTE) SARS-CoV-2 target nucleic acids are NOT DETECTED. The SARS-CoV-2 RNA is generally detectable in upper and lower respiratory specimens during the acute phase of infection. Negative results do not  preclude SARS-CoV-2 infection, do not rule out co-infections with other pathogens, and should not be used as the sole basis for treatment or other patient management decisions. Negative results must be combined with clinical observations, patient history, and epidemiological information. The expected result is Negative. Fact Sheet for Patients: SugarRoll.be Fact Sheet for Healthcare Providers: https://www.woods-mathews.com/ This test is not yet approved or  cleared by the [place] and  has been authorized for detection and/or diagnosis of SARS-CoV-2 by FDA under an Emergency Use Authorization (EUA). This EUA will remain  in effect (meaning this test can be used) for the duration of the COVID-19 declaration under Section 56 4(b)(1) of the Act, 21 U.S.C. section 360bbb-3(b)(1), unless the authorization is terminated or revoked sooner. Performed at [facility], [place] [address] [place], [place] [address]     Discharge Instructions     Discharge Instructions    Diet - low sodium heart healthy   Complete by: As directed    Discharge instructions   Complete by: As directed    You were seen and examined in the hospital for GI bleed and cared for by a hospitalist and gastroenterologist  Upon Discharge:  -take protonix twice daily -do not take any ibuprofen/motrin/alleve/naproxen/advil Make an appointment with your primary care physician within 7 days Get lab work prior to your follow up appointment with your PCP Bring all home medications to your appointment to review Request that your primary physician go over all hospital tests and procedures/radiological results at the follow up.   Please get all hospital records sent to your physician by signing a hospital release before you go home.   Read the complete instructions along with all the possible side effects for all the medicines you take and that have been prescribed to you.  Take any new medicines after you have completely understood and accept all the possible adverse reactions/side effects.   If you have any questions about your discharge medications or the care you received while you were in the hospital, you can call the unit and asked to speak with the hospitalist on call. Once you are discharged, your primary care physician will handle any further medical issues. Please note that NO REFILLS for any discharge medications will be authorized, as it is imperative that you return to your primary care physician (or establish a relationship with a primary care physician if you do not have one) for your aftercare needs so that they can reassess your need for medications and monitor your lab values.   Do not drive, operate heavy machinery, perform activities at heights, swimming or participation in water activities or provide baby sitting services if your were admitted for loss of consciousness/seizures or if you are on sedating medications including, but not limited to benzodiazepines, sleep medications, narcotic pain medications, etc., until you have been cleared to do so by a medical doctor.   Do not take more than prescribed medications.   Wear a seat belt while driving.  If you have smoked or chewed Tobacco in the last 2 years please stop smoking; also stop any regular Alcohol and/or any Recreational drug use including marijuana.  If you experience worsening of your admission symptoms or develop shortness of breath, chest pain, suicidal or homicidal thoughts or experience a life threatening emergency, you must seek medical attention immediately by calling 911 or calling your PCP immediately.   Increase activity slowly   Complete by: As directed    Increase activity slowly   Complete by: As directed      Allergies as of 04/05/2019      Reactions   Tape Other (See Comments)   SKIN IS SENSITIVE; PLEASE USE PAPER TAPE; SKIN BRUISES AND TEARS EASILY!!   Azithromycin  Rash, Other (See Comments)   Pt had a Z-Pak Jan, 2020 - no reaction(??)      Medication  List    STOP taking these medications   predniSONE 10 MG tablet Commonly known as: DELTASONE     TAKE these medications   ProAir HFA 108 (90 Base) MCG/ACT inhaler Generic drug: albuterol Inhale 2 puffs into the lungs every 6 (six) hours as needed for wheezing or shortness of breath.   albuterol (2.5 MG/3ML) 0.083% nebulizer solution Commonly known as: PROVENTIL Take 3 mLs by nebulization every 6 (six) hours as needed for wheezing or shortness of breath.   budesonide 0.5 MG/2ML nebulizer solution Commonly known as: PULMICORT Take 2 mLs by nebulization 2 (two) times daily.   carvedilol 3.125 MG tablet Commonly known as: COREG Take 1 tablet (3.125 mg total) by mouth 2 (two) times daily with a meal.   clopidogrel 75 MG tablet Commonly known as: PLAVIX Take 75 mg by mouth daily.   co-enzyme Q-10 30 MG capsule Take 7 capsules (210 mg total) by mouth daily.   diphenhydramine-acetaminophen 25-500 MG Tabs tablet Commonly known as: TYLENOL PM Take 2 tablets by mouth at bedtime.   ferrous sulfate 325 (65 FE) MG tablet Take 1 tablet (325 mg total) by mouth 2 (two) times daily with a meal. What changed: when to take this   fluticasone 50 MCG/ACT nasal spray Commonly known as: FLONASE Place 1 spray into both nostrils daily as needed for allergies or rhinitis.   Melatonin 10 MG Tabs Take 10 mg by mouth at bedtime as needed (for sleep).   metFORMIN 1000 MG tablet Commonly known as: GLUCOPHAGE Take 1,000 mg by mouth daily after breakfast.   montelukast 10 MG tablet Commonly known as: SINGULAIR Take 10 mg by mouth at bedtime as needed (for breathing flares).   pantoprazole 40 MG tablet Commonly known as: PROTONIX Take 1 tablet (40 mg total) by mouth 2 (two) times daily.   polyethylene glycol 17 g packet Commonly known as: MIRALAX / GLYCOLAX Take 17 g by mouth daily as needed for mild  constipation (MIX AND DRINK).   psyllium 58.6 % powder Commonly known as: METAMUCIL Take 1 packet by mouth daily as needed (for constipation- MIX AND DRINK).   rOPINIRole 1 MG tablet Commonly known as: REQUIP Take 2 mg by mouth at bedtime.   simvastatin 40 MG tablet Commonly known as: ZOCOR Take 1 tablet (40 mg total) by mouth daily. What changed: when to take this       Allergies  Allergen Reactions  . Tape Other (See Comments)    SKIN IS SENSITIVE; PLEASE USE PAPER TAPE; SKIN BRUISES AND TEARS EASILY!!  . Azithromycin Rash and Other (See Comments)    Pt had a Z-Pak Jan, 2020 - no reaction(??)    Time coordinating discharge: Over 30 minutes   SIGNED:   Harold Hedge, D.O. [facility] Pager: [phone]  04/05/2019, 5:40 PM

## 2019-04-05 NOTE — Progress Notes (Signed)
OT Cancellation Note  Patient Details Name: [name] MRN: [id number] DOB: 1931-11-18   Cancelled Treatment:    Reason Eval/Treat Not Completed: Patient at procedure or test/ unavailable  Malka So 04/05/2019, 2:46 PM  Nestor Lewandowsky, OTR/L Acute Rehabilitation Services Pager: [phone] Office: [phone]

## 2019-04-05 NOTE — Progress Notes (Signed)
Patient ambulated for about 100 feet and tolerated well. Dr. Marva Panda MD was informed and ordered discharge. Patient had Black bowel movement and reported to Dr. Neysa Bonito. He called back to explain that the BM might be residual. So Vitals was checked BP 141/72 HR 76 SPO2 98 Room Air Alert [place] X4. Based on the vitals, Dr. Neysa Bonito verbally agreed for the patient to be discharged and we explained verbally to patient wife and she also accepted the Dr.'s decision for discharge.

## 2019-04-05 NOTE — Progress Notes (Signed)
PT Cancellation Note  Patient Details Name: [name] MRN: [id number] DOB: [birth year]   Cancelled Treatment:    Reason Eval/Treat Not Completed: Patient at procedure or test/unavailable; down for EGD.  Will attempt again another day.    Reginia Naas 04/05/2019, 2:21 PM Magda Kiel, [place] [phone] 04/05/2019

## 2019-04-05 NOTE — Discharge Instructions (Signed)

## 2019-04-05 NOTE — Interval H&P Note (Signed)
History and Physical Interval Note:  04/05/2019 2:42 PM  [name]  has presented today for surgery, with the diagnosis of Anemia.  Occult GI bleed..  The various methods of treatment have been discussed with the patient and family. After consideration of risks, benefits and other options for treatment, the patient has consented to  Procedure(s): ESOPHAGOGASTRODUODENOSCOPY (EGD) WITH PROPOFOL (N/A) as a surgical intervention.  The patient's history has been reviewed, patient examined, no change in status, stable for surgery.  I have reviewed the patient's chart and labs.  Questions were answered to the patient's satisfaction.     Thornton Glotfelty

## 2019-04-05 NOTE — Transfer of Care (Signed)
Immediate Anesthesia Transfer of Care Note  Patient: [place]  Procedure(s) Performed: ESOPHAGOGASTRODUODENOSCOPY (EGD) WITH PROPOFOL (N/A ) BIOPSY  Patient Location: PACU  Anesthesia Type:MAC  Level of Consciousness: awake, alert  and oriented  Airway & Oxygen Therapy: Patient Spontanous Breathing  Post-op Assessment: Report given to RN and Post -op Vital signs reviewed and stable  Post vital signs: Reviewed and stable  Last Vitals:  Vitals Value Taken Time  BP    Temp    Pulse 69 04/05/19 1533  Resp 18 04/05/19 1533  SpO2 100 % 04/05/19 1533  Vitals shown include unvalidated device data.  Last Pain:  Vitals:   04/05/19 1527  TempSrc: Oral  PainSc: 0-No pain         Complications: No apparent anesthesia complications

## 2019-04-06 LAB — SURGICAL PATHOLOGY

## 2019-04-07 ENCOUNTER — Encounter: Payer: Self-pay | Admitting: *Deleted

## 2019-04-07 ENCOUNTER — Telehealth: Payer: Self-pay

## 2019-04-07 ENCOUNTER — Encounter: Payer: Self-pay | Admitting: Gastroenterology

## 2019-04-07 DIAGNOSIS — Z8719 Personal history of other diseases of the digestive system: Secondary | ICD-10-CM

## 2019-04-07 NOTE — Telephone Encounter (Signed)
05/10/19 at 150 pm appt with Dr Ardis Hughs also labs in [place] for 2 weeks. The patient has been notified of this information and all questions answered.

## 2019-04-07 NOTE — Telephone Encounter (Signed)
-----   Message from Milus Banister, MD sent at 04/05/2019  6:47 PM EST ----- He is being d/cd today from [facility].  Can you call him to arrange cbc in 2 weeks and rov with me in 5-6 weeks for hosp follow up.  Thanks

## 2019-04-26 ENCOUNTER — Other Ambulatory Visit (INDEPENDENT_AMBULATORY_CARE_PROVIDER_SITE_OTHER): Payer: Medicare Other

## 2019-04-26 DIAGNOSIS — Z8719 Personal history of other diseases of the digestive system: Secondary | ICD-10-CM | POA: Diagnosis not present

## 2019-04-26 LAB — CBC WITH DIFFERENTIAL/PLATELET
Basophils Absolute: 0 10*3/uL (ref 0.0–0.1)
Basophils Relative: 0.3 % (ref 0.0–3.0)
Eosinophils Absolute: 0 10*3/uL (ref 0.0–0.7)
Eosinophils Relative: 0.5 % (ref 0.0–5.0)
HCT: 36.6 % — ABNORMAL LOW (ref 39.0–52.0)
Hemoglobin: 11.5 g/dL — ABNORMAL LOW (ref 13.0–17.0)
Lymphocytes Relative: 11.8 % — ABNORMAL LOW (ref 12.0–46.0)
Lymphs Abs: 1.1 10*3/uL (ref 0.7–4.0)
MCHC: 31.5 g/dL (ref 30.0–36.0)
MCV: 87.8 fl (ref 78.0–100.0)
Monocytes Absolute: 0.6 10*3/uL (ref 0.1–1.0)
Monocytes Relative: 5.8 % (ref 3.0–12.0)
Neutro Abs: 7.8 10*3/uL — ABNORMAL HIGH (ref 1.4–7.7)
Neutrophils Relative %: 81.6 % — ABNORMAL HIGH (ref 43.0–77.0)
Platelets: 538 10*3/uL — ABNORMAL HIGH (ref 150.0–400.0)
RBC: 4.17 Mil/uL — ABNORMAL LOW (ref 4.22–5.81)
RDW: 17.3 % — ABNORMAL HIGH (ref 11.5–15.5)
WBC: 9.5 10*3/uL (ref 4.0–10.5)

## 2019-05-10 ENCOUNTER — Encounter: Payer: Self-pay | Admitting: Gastroenterology

## 2019-05-10 ENCOUNTER — Ambulatory Visit: Payer: Medicare Other | Admitting: Gastroenterology

## 2019-05-10 VITALS — BP 128/72 | HR 60 | Temp 98.1°F | Ht 70.0 in | Wt 150.0 lb

## 2019-05-10 DIAGNOSIS — Z8719 Personal history of other diseases of the digestive system: Secondary | ICD-10-CM | POA: Diagnosis not present

## 2019-05-10 MED ORDER — FERROUS SULFATE 325 (65 FE) MG PO TABS: 325.0000 mg | ORAL_TABLET | Freq: Every day | ORAL | Status: DC

## 2019-05-10 MED ORDER — GAS-X PREVENTION PO CAPS: 1.0000 | ORAL_CAPSULE | Freq: Two times a day (BID) | ORAL | Status: DC

## 2019-05-10 NOTE — Progress Notes (Signed)
Review of pertinent gastrointestinal problems:  1. routine risk for colon cancer.Colonoscopy 2003 with Dr. Inocente Salles, polyps removed but none were adenomatous. Colonoscopy June 2009 Dr. Ardis Hughs found no polyps. Significant left sided diverticulosis was noted.  2. esophageal ring, focal stricture dilated 2009with deeper than usual mucosal tear, followup Gastrografin esophagram showed no extravasation  3. status post fundoplication 4. Recurrent GI bleeding, presumed diverticular. 2009 did not require blood transfusion;2018 he was hospitalized twice for overt red rectal bleeding, presumed diverticular.Hb nadir 6.8 (normally Hb 14).Colonoscopy 11/2016 Dr. Carlean Purl confirmed multiple left-sided diverticulosis pockets, the prep in the right side was poor. There was no acute blood in the colon. He also presumed that his acute bleeding was diverticular in origin.Chronic Plavix use probably contributes. 01/2018 another episode Hb nadir 8 (normally Hb 12s). Received two units PRBC.  Repeat CT scan 01/2018 with IV contrast (ordered by ER I believe) showed no clear source.  Nuclear medicine bleeding scan was positive for active GI bleeding "with right colon as a suspected source of bleeding".  Follow-up interventional radiology angiogram was negative for acute bleeding.  Nuc med Meckel's scan was negative.  Repeat colonoscopy February 2021 showed diverticulosis with dark stool throughout polyp possibly melena.  EGD February 2021 showed hiatal hernia, some gastritis, essentially no obvious source of his GI bleeding.  He received a total of 5 units blood this admission. 5. Splenectomy; 2009,ruptured.   HPI: This is a very pleasant 86 year old man who is turning 7 in July.  He is here with his wife today.  He spent several days in the hospital last month with another GI bleed.  These are presumed diverticular related.  He has been having these on and off for many years although certainly the pace has picked up in  the past 2 or 3 years.  CBC March 10 showed hemoglobin 11.5  Since he left the hospital he has had no overt GI bleeding.  He is getting physical therapy to help him recover and he is enjoying that, definitely gaining strength.  He has a lot of belching and flatulence that he is bothered with.     ROS: complete GI ROS as described in HPI, all other review negative.  Constitutional:  No unintentional weight loss   Past Medical History:  Diagnosis Date  . Acute CVA (cerebrovascular accident) ([place]) 07/25/2015  . Asthma   . Benign essential HTN   . Cerebral infarction due to embolism of left middle cerebral artery ([place]) 02/03/2014  . Chronic combined systolic and diastolic CHF (congestive heart failure) ([place]) 12/23/2017  . Colitis   . Colon polyps   . Diabetes mellitus type 2, diet-controlled ([place]) 12/22/2013  . Diverticulosis   . GERD (gastroesophageal reflux disease)   . GI bleed   . Hemorrhoids   . Hiatal hernia   . History of esophageal strciture   . HLD (hyperlipidemia)   . Osteoarthritis   . Proctitis   . Status post dilation of esophageal narrowing   . Stroke [facility])     Past Surgical History:  Procedure Laterality Date  . ABDOMINAL HERNIA REPAIR    . BIOPSY  04/05/2019   Procedure: BIOPSY;  Surgeon: Thornton Bellucci, MD;  Location: [place];  Service: Gastroenterology;;  . COLONOSCOPY     multiple  . COLONOSCOPY WITH PROPOFOL N/A 12/12/2016   Procedure: COLONOSCOPY WITH PROPOFOL;  Surgeon: Gatha Mayer, MD;  Location: [facility] ENDOSCOPY;  Service: Endoscopy;  Laterality: N/A;  . COLONOSCOPY WITH PROPOFOL N/A 04/04/2019   Procedure:  COLONOSCOPY WITH PROPOFOL;  Surgeon: Milus Banister, MD;  Location: [facility] ENDOSCOPY;  Service: Endoscopy;  Laterality: N/A;  . ESOPHAGOGASTRODUODENOSCOPY     multiple  . ESOPHAGOGASTRODUODENOSCOPY (EGD) WITH PROPOFOL N/A 04/05/2019   Procedure: ESOPHAGOGASTRODUODENOSCOPY (EGD) WITH PROPOFOL;  Surgeon: Thornton Doro, MD;  Location: [place];  Service: Gastroenterology;  Laterality: N/A;  . INSERTION OF MESH  01/29/2012   Procedure: INSERTION OF MESH;  Surgeon: Gwenyth Ober, MD;  Location: [place];  Service: General;  Laterality: N/A;  . IR ANGIOGRAM SELECTIVE EACH ADDITIONAL VESSEL  04/01/2019  . IR ANGIOGRAM VISCERAL SELECTIVE  04/01/2019  . IR ANGIOGRAM VISCERAL SELECTIVE  04/01/2019  . IR ANGIOGRAM VISCERAL SELECTIVE  04/01/2019  . IR US GUIDE VASC ACCESS RIGHT  04/01/2019  . NISSEN FUNDOPLICATION    . SPLENECTOMY, TOTAL     nontraumatic rupture  . VENTRAL HERNIA REPAIR  01/29/2012    WITH MESH  . VENTRAL HERNIA REPAIR  01/29/2012   Procedure: HERNIA REPAIR VENTRAL ADULT;  Surgeon: Gwenyth Ober, MD;  Location: [place];  Service: General;  Laterality: N/A;  open recurrent ventral hernia repair with mesh    Current Outpatient Medications  Medication Sig Dispense Refill  . albuterol (PROAIR HFA) 108 (90 Base) MCG/ACT inhaler Inhale 2 puffs into the lungs every 6 (six) hours as needed for wheezing or shortness of breath.    Marland Kitchen albuterol (PROVENTIL) (2.5 MG/3ML) 0.083% nebulizer solution Take 3 mLs by nebulization every 6 (six) hours as needed for wheezing or shortness of breath.     . budesonide (PULMICORT) 0.5 MG/2ML nebulizer solution Take 2 mLs by nebulization 2 (two) times daily.    . carvedilol (COREG) 3.125 MG tablet Take 1 tablet (3.125 mg total) by mouth 2 (two) times daily with a meal. 60 tablet 1  . clopidogrel (PLAVIX) 75 MG tablet Take 75 mg by mouth daily.     . diphenhydramine-acetaminophen (TYLENOL PM) 25-500 MG TABS tablet Take 2 tablets by mouth at bedtime.     . ferrous sulfate 325 (65 FE) MG tablet Take 1 tablet (325 mg total) by mouth 2 (two) times daily with a meal. (Patient taking differently: Take 325 mg by mouth daily with breakfast. ) 60 tablet 0  . fluticasone (FLONASE) 50 MCG/ACT nasal spray Place 1 spray into both nostrils daily as needed for allergies or rhinitis.   5  . Melatonin 10 MG TABS Take  10 mg by mouth at bedtime as needed (for sleep).     . metFORMIN (GLUCOPHAGE) 1000 MG tablet Take 1,000 mg by mouth daily after breakfast.     . montelukast (SINGULAIR) 10 MG tablet Take 10 mg by mouth at bedtime as needed (for breathing flares).     . pantoprazole (PROTONIX) 40 MG tablet Take 1 tablet (40 mg total) by mouth 2 (two) times daily. 120 tablet 0  . polyethylene glycol (MIRALAX / GLYCOLAX) packet Take 17 g by mouth daily as needed for mild constipation (MIX AND DRINK).     Marland Kitchen psyllium (METAMUCIL) 58.6 % powder Take 1 packet by mouth daily as needed (for constipation- MIX AND DRINK).     Marland Kitchen rOPINIRole (REQUIP) 1 MG tablet Take 2 mg by mouth at bedtime.    . simvastatin (ZOCOR) 40 MG tablet Take 1 tablet (40 mg total) by mouth daily. (Patient taking differently: Take 40 mg by mouth at bedtime. ) 30 tablet 3   No current facility-administered medications for this visit.    Allergies as  of 05/10/2019 - Review Complete 05/10/2019  Allergen Reaction Noted  . Tape Other (See Comments) 02/04/2018  . Azithromycin Rash and Other (See Comments) 03/08/2015    Family History  Problem Relation Age of Onset  . Alzheimer's disease Mother   . Breast cancer Mother   . Throat cancer Father   . Colon cancer Neg Hx   . Colon polyps Neg Hx   . Pancreatic cancer Neg Hx   . Stomach cancer Neg Hx   . Liver disease Neg Hx     Social History   Socioeconomic History  . Marital status: Married    Spouse name: [name]  . Number of children: 2  . Years of education: Bachelor's  . Highest education level: Not on file  Occupational History  . Occupation: retired  Tobacco Use  . Smoking status: Never Smoker  . Smokeless tobacco: Never Used  Substance and Sexual Activity  . Alcohol use: Yes    Alcohol/week: 2.0 standard drinks    Types: 2 Glasses of wine per week    Comment: daily  . Drug use: No  . Sexual activity: Not on file  Other Topics Concern  . Not on file  Social History Narrative    Patient is married and has 2 children.   Patient is right handed.   Patient has Bachelor's degree.   Patient drinks 2 cups daily.   Social Determinants of Health   Financial Resource Strain:   . Difficulty of Paying Living Expenses:   Food Insecurity:   . Worried About Charity fundraiser in the Last Year:   . Arboriculturist in the Last Year:   Transportation Needs:   . Film/video editor (Medical):   Marland Kitchen Lack of Transportation (Non-Medical):   Physical Activity:   . Days of Exercise per Week:   . Minutes of Exercise per Session:   Stress:   . Feeling of Stress :   Social Connections:   . Frequency of Communication with Friends and Family:   . Frequency of Social Gatherings with Friends and Family:   . Attends Religious Services:   . Active Member of Clubs or Organizations:   . Attends Archivist Meetings:   Marland Kitchen Marital Status:   Intimate Partner Violence:   . Fear of Current or Ex-Partner:   . Emotionally Abused:   Marland Kitchen Physically Abused:   . Sexually Abused:      Physical Exam: BP 128/72 (BP Location: Right Arm, Patient Position: Sitting, Cuff Size: Normal)   Pulse 60   Temp 98.1 F (36.7 C) (Oral)   Ht '5\' 10"'  (1.778 m)   Wt 150 lb (68 kg)   BMI 21.52 kg/m  Constitutional: generally well-appearing Psychiatric: alert and oriented x3 Abdomen: soft, nontender, nondistended, no obvious ascites, no peritoneal signs, normal bowel sounds No peripheral edema noted in lower extremities  Assessment and plan: 86 y.o. male with recurrent GI bleeding presumed: Diverticular  He understands that his ongoing high need for blood thinner, antiplatelet agent Plavix likely increases the volume of any bleeding that he has.  He cannot stop that medicine due to significant risk for stroke.  He had a 5 unit bleed last month.,  This is by far the highest volume bleed that he has ever suffered.  He underwent another battery of tests including nuc med scans, CT angiograms,  interventional radiology angiography, colonoscopy, upper endoscopy.  The nuc med bleeding scan suggested possible right colon as a source.  He  has been very appreciative of our care and attempts to help him.  We discussed elective referral to a surgeon, he has met them before in the hospital and he and his wife both agree that he would not want to go through with what would probably have to be a total abdominal colectomy.  We had a discussion about further testing and he is wife are both clear that if he has another episode of obvious overt bleeding he would not want more testing being done besides lab tests and blood transfusions.  He said all the testings this past admission really drained him and he is still recovering from that.  I think that is certainly reasonable and I asked him to point this note out to any emergency physicians or hospitalists if he has overt bleeding again.  He also knows to alert them to call our service if that is the case.  He is going to try Gas-X for his belching, flatulence.  He will return on as-needed basis.  Please see the "Patient Instructions" section for addition details about the plan.  Owens Loffler, MD [facility] 05/10/2019, 3:33 PM   Total time on date of encounter was 30 minutes (this included time spent preparing to see the patient reviewing records; obtaining and/or reviewing separately obtained history; performing a medically appropriate exam and/or evaluation; counseling and educating the patient and family if present; ordering medications, tests or procedures if applicable; and documenting clinical information in the health record).

## 2019-05-10 NOTE — Patient Instructions (Signed)
If you are age 84 or older, your body mass index should be between 23-30. Your Body mass index is 21.52 kg/m. If this is out of the aforementioned range listed, please consider follow up with your Primary Care Provider.  If you are age 50 or younger, your body mass index should be between 19-25. Your Body mass index is 21.52 kg/m. If this is out of the aformentioned range listed, please consider follow up with your Primary Care Provider.   Please purchase the following medications over the counter and take as directed:  START: Gas X 1 pill twice daily with meals.  REDUCE: Iron to 1 pill once daily.  Thank you for entrusting me with your care and choosing [facility]  Dr Ardis Hughs

## 2019-06-19 ENCOUNTER — Other Ambulatory Visit: Payer: Self-pay

## 2019-06-19 ENCOUNTER — Telehealth: Payer: Self-pay | Admitting: Internal Medicine

## 2019-06-19 ENCOUNTER — Inpatient Hospital Stay (HOSPITAL_COMMUNITY)
Admission: EM | Admit: 2019-06-19 | Discharge: 2019-06-22 | DRG: 378 | Disposition: A | Discharge status: Home / Self Care | Payer: Medicare Other | Attending: Internal Medicine | Admitting: Internal Medicine

## 2019-06-19 ENCOUNTER — Encounter (HOSPITAL_COMMUNITY): Payer: Self-pay | Admitting: Emergency Medicine

## 2019-06-19 DIAGNOSIS — Z8673 Personal history of transient ischemic attack (TIA), and cerebral infarction without residual deficits: Secondary | ICD-10-CM

## 2019-06-19 DIAGNOSIS — G2581 Restless legs syndrome: Secondary | ICD-10-CM | POA: Diagnosis present

## 2019-06-19 DIAGNOSIS — Z808 Family history of malignant neoplasm of other organs or systems: Secondary | ICD-10-CM | POA: Diagnosis not present

## 2019-06-19 DIAGNOSIS — I10 Essential (primary) hypertension: Secondary | ICD-10-CM | POA: Diagnosis present

## 2019-06-19 DIAGNOSIS — Z803 Family history of malignant neoplasm of breast: Secondary | ICD-10-CM | POA: Diagnosis not present

## 2019-06-19 DIAGNOSIS — E119 Type 2 diabetes mellitus without complications: Secondary | ICD-10-CM

## 2019-06-19 DIAGNOSIS — I110 Hypertensive heart disease with heart failure: Secondary | ICD-10-CM | POA: Diagnosis present

## 2019-06-19 DIAGNOSIS — Z8719 Personal history of other diseases of the digestive system: Secondary | ICD-10-CM

## 2019-06-19 DIAGNOSIS — I959 Hypotension, unspecified: Secondary | ICD-10-CM | POA: Diagnosis present

## 2019-06-19 DIAGNOSIS — Z20822 Contact with and (suspected) exposure to covid-19: Secondary | ICD-10-CM | POA: Diagnosis present

## 2019-06-19 DIAGNOSIS — E785 Hyperlipidemia, unspecified: Secondary | ICD-10-CM | POA: Diagnosis present

## 2019-06-19 DIAGNOSIS — I5042 Chronic combined systolic (congestive) and diastolic (congestive) heart failure: Secondary | ICD-10-CM | POA: Diagnosis present

## 2019-06-19 DIAGNOSIS — K219 Gastro-esophageal reflux disease without esophagitis: Secondary | ICD-10-CM | POA: Diagnosis present

## 2019-06-19 DIAGNOSIS — Z881 Allergy status to other antibiotic agents status: Secondary | ICD-10-CM | POA: Diagnosis not present

## 2019-06-19 DIAGNOSIS — Z7902 Long term (current) use of antithrombotics/antiplatelets: Secondary | ICD-10-CM

## 2019-06-19 DIAGNOSIS — K5791 Diverticulosis of intestine, part unspecified, without perforation or abscess with bleeding: Secondary | ICD-10-CM | POA: Diagnosis not present

## 2019-06-19 DIAGNOSIS — Z7984 Long term (current) use of oral hypoglycemic drugs: Secondary | ICD-10-CM | POA: Diagnosis not present

## 2019-06-19 DIAGNOSIS — J45909 Unspecified asthma, uncomplicated: Secondary | ICD-10-CM | POA: Diagnosis present

## 2019-06-19 DIAGNOSIS — K5731 Diverticulosis of large intestine without perforation or abscess with bleeding: Principal | ICD-10-CM | POA: Diagnosis present

## 2019-06-19 DIAGNOSIS — K922 Gastrointestinal hemorrhage, unspecified: Secondary | ICD-10-CM | POA: Diagnosis present

## 2019-06-19 DIAGNOSIS — Z7901 Long term (current) use of anticoagulants: Secondary | ICD-10-CM

## 2019-06-19 DIAGNOSIS — Z820 Family history of epilepsy and other diseases of the nervous system: Secondary | ICD-10-CM | POA: Diagnosis not present

## 2019-06-19 DIAGNOSIS — K222 Esophageal obstruction: Secondary | ICD-10-CM

## 2019-06-19 LAB — CBC WITH DIFFERENTIAL/PLATELET
Abs Immature Granulocytes: 0.04 10*3/uL (ref 0.00–0.07)
Basophils Absolute: 0.1 10*3/uL (ref 0.0–0.1)
Basophils Relative: 1 %
Eosinophils Absolute: 0.4 10*3/uL (ref 0.0–0.5)
Eosinophils Relative: 4 %
HCT: 40.6 % (ref 39.0–52.0)
Hemoglobin: 12.3 g/dL — ABNORMAL LOW (ref 13.0–17.0)
Immature Granulocytes: 0 %
Lymphocytes Relative: 28 %
Lymphs Abs: 2.9 10*3/uL (ref 0.7–4.0)
MCH: 27.9 pg (ref 26.0–34.0)
MCHC: 30.3 g/dL (ref 30.0–36.0)
MCV: 92.1 fL (ref 80.0–100.0)
Monocytes Absolute: 0.9 10*3/uL (ref 0.1–1.0)
Monocytes Relative: 8 %
Neutro Abs: 6.1 10*3/uL (ref 1.7–7.7)
Neutrophils Relative %: 59 %
Platelets: 380 10*3/uL (ref 150–400)
RBC: 4.41 MIL/uL (ref 4.22–5.81)
RDW: 17.2 % — ABNORMAL HIGH (ref 11.5–15.5)
WBC: 10.4 10*3/uL (ref 4.0–10.5)
nRBC: 0 % (ref 0.0–0.2)

## 2019-06-19 LAB — COMPREHENSIVE METABOLIC PANEL
ALT: 11 U/L (ref 0–44)
AST: 17 U/L (ref 15–41)
Albumin: 3.5 g/dL (ref 3.5–5.0)
Alkaline Phosphatase: 52 U/L (ref 38–126)
Anion gap: 8 (ref 5–15)
BUN: 12 mg/dL (ref 8–23)
CO2: 25 mmol/L (ref 22–32)
Calcium: 8.5 mg/dL — ABNORMAL LOW (ref 8.9–10.3)
Chloride: 102 mmol/L (ref 98–111)
Creatinine, Ser: 0.78 mg/dL (ref 0.61–1.24)
GFR calc Af Amer: >60 mL/min (ref >60)
GFR calc non Af Amer: >60 mL/min (ref >60)
Glucose, Bld: 154 mg/dL — ABNORMAL HIGH (ref 70–99)
Potassium: 4.4 mmol/L (ref 3.5–5.1)
Sodium: 135 mmol/L (ref 135–145)
Total Bilirubin: 0.5 mg/dL (ref 0.3–1.2)
Total Protein: 6.2 g/dL — ABNORMAL LOW (ref 6.5–8.1)

## 2019-06-19 LAB — CBC
HCT: 39.5 % (ref 39.0–52.0)
Hemoglobin: 12.1 g/dL — ABNORMAL LOW (ref 13.0–17.0)
MCH: 28.4 pg (ref 26.0–34.0)
MCHC: 30.6 g/dL (ref 30.0–36.0)
MCV: 92.7 fL (ref 80.0–100.0)
Platelets: 323 10*3/uL (ref 150–400)
RBC: 4.26 MIL/uL (ref 4.22–5.81)
RDW: 16.9 % — ABNORMAL HIGH (ref 11.5–15.5)
WBC: 13.8 10*3/uL — ABNORMAL HIGH (ref 4.0–10.5)
nRBC: 0 % (ref 0.0–0.2)

## 2019-06-19 LAB — LACTIC ACID, PLASMA: Lactic Acid, Venous: 1.9 mmol/L (ref 0.5–1.9)

## 2019-06-19 LAB — RESPIRATORY PANEL BY RT PCR (FLU A&B, COVID)
Influenza A by PCR: NEGATIVE
Influenza B by PCR: NEGATIVE
SARS Coronavirus 2 by RT PCR: NEGATIVE

## 2019-06-19 MED ORDER — BUDESONIDE 0.5 MG/2ML IN SUSP
2.0000 mL | Freq: Two times a day (BID) | RESPIRATORY_TRACT | Status: DC
  Administered 2019-06-19 – 2019-06-21 (×4): 0.5 mg via RESPIRATORY_TRACT
  Filled 2019-06-19 (×5): qty 2

## 2019-06-19 MED ORDER — PANTOPRAZOLE SODIUM 40 MG IV SOLR
40.0000 mg | Freq: Two times a day (BID) | INTRAVENOUS | Status: DC
  Administered 2019-06-19 – 2019-06-22 (×6): 40 mg via INTRAVENOUS
  Filled 2019-06-19 (×6): qty 40

## 2019-06-19 MED ORDER — ONDANSETRON HCL 4 MG/2ML IJ SOLN
4.0000 mg | INTRAMUSCULAR | Status: AC
  Administered 2019-06-19: 4 mg via INTRAVENOUS
  Filled 2019-06-19: qty 2

## 2019-06-19 MED ORDER — ALBUTEROL SULFATE HFA 108 (90 BASE) MCG/ACT IN AERS
2.0000 | INHALATION_SPRAY | Freq: Four times a day (QID) | RESPIRATORY_TRACT | Status: DC | PRN
  Filled 2019-06-19: qty 6.7

## 2019-06-19 MED ORDER — FLUTICASONE PROPIONATE 50 MCG/ACT NA SUSP
1.0000 | Freq: Every day | NASAL | Status: DC | PRN
  Filled 2019-06-19: qty 16

## 2019-06-19 MED ORDER — SODIUM CHLORIDE 0.9 % IV SOLN: 10.0000 mL/h | Freq: Once | INTRAVENOUS | Status: DC

## 2019-06-19 MED ORDER — MELATONIN 5 MG PO TABS
5.0000 mg | ORAL_TABLET | Freq: Every evening | ORAL | Status: DC | PRN
  Filled 2019-06-19: qty 1

## 2019-06-19 MED ORDER — MONTELUKAST SODIUM 10 MG PO TABS
10.0000 mg | ORAL_TABLET | Freq: Every evening | ORAL | Status: DC | PRN
  Filled 2019-06-19: qty 1

## 2019-06-19 MED ORDER — SODIUM CHLORIDE 0.9 % IV BOLUS
1000.0000 mL | Freq: Once | INTRAVENOUS | Status: AC
  Administered 2019-06-19: 1000 mL via INTRAVENOUS

## 2019-06-19 MED ORDER — ONDANSETRON HCL 4 MG/2ML IJ SOLN: 4.0000 mg | INTRAMUSCULAR | Status: DC | PRN

## 2019-06-19 MED ORDER — ROPINIROLE HCL 1 MG PO TABS
2.0000 mg | ORAL_TABLET | Freq: Every day | ORAL | Status: DC
  Administered 2019-06-20 – 2019-06-21 (×2): 2 mg via ORAL
  Filled 2019-06-19 (×4): qty 2

## 2019-06-19 MED ORDER — METOCLOPRAMIDE HCL 5 MG/ML IJ SOLN
10.0000 mg | INTRAMUSCULAR | Status: AC
  Administered 2019-06-19: 10 mg via INTRAVENOUS
  Filled 2019-06-19: qty 2

## 2019-06-19 MED ORDER — SIMVASTATIN 20 MG PO TABS
40.0000 mg | ORAL_TABLET | Freq: Every day | ORAL | Status: DC
  Administered 2019-06-20 – 2019-06-21 (×2): 40 mg via ORAL
  Filled 2019-06-19 (×2): qty 2

## 2019-06-19 NOTE — ED Notes (Signed)
Dr. Marice Potter made aware of pt's BP, no recent BMs or obvious bleeding. Increased IV fluid rate per verbal order.

## 2019-06-19 NOTE — Telephone Encounter (Signed)
I received an on-call call from Mrs. [name].  Her husband [name] has had recurrent maroon stool/hematochezia x3 starting at 3 PM this afternoon.  He is on his way to the [facility] emergency department by EMS.  He lives at [facility] about 45 minutes from the hospital.  He last took Plavix this morning.  Chart review and he is known well to our service, best by Dr. Ardis Hughs his primary gastroenterologist whom he saw in clinic just about 5 weeks ago.  He had a 5 unit GI bleed earlier this year and had extensive evaluation which did not reveal a source for hemorrhage.  The following was taken from Dr. Audelia Acton last note  "We had a discussion about further testing and he is wife are both clear that if he has another episode of obvious overt bleeding he would not want more testing being done besides lab tests and blood transfusions.  He said all the testings this past admission really drained him and he is still recovering from that.  I think that is certainly reasonable and I asked him to point this note out to any emergency physicians or hospitalists if he has overt bleeding again.  He also knows to alert them to call our service if that is the case."  He will be evaluated by the emergency room and GI will be available for consultation.  Mrs. [name] thanked me for the call.  CC Dr. Ardis Hughs as primary GI doctor and Dr. Tarri Glenn as hospital GI doctor this week

## 2019-06-19 NOTE — ED Notes (Signed)
Pt has a BM 668ml, mostly blood. Pt became diaphoretic and pale, stating that he felt very dizzy. Annamary Carolin, RN notified.

## 2019-06-19 NOTE — H&P (Addendum)
[facility] History and Physical  [name] [id number] DOB: April 13, 1931 DOA: 06/19/2019  Referring EDP: Rex Kras PCP: Seward Carol, MD   Chief Complaint: bloody bowel movement   HPI: [name] is a 86 y.o. male with PMH of asthma, hyperlipidemia, DM 2, CHF (EF 45%), h/o stroke, GERD, diverticulosis who presented to ED with hematochezia and admitted for GI bleed.  Patient reports that he was feeling well today until about 1500 when he had a large bloody bowel movement. This happened about 3 more times and he called EMS. Upon arrival, patient with another large bloody bowel movement and subsequently had likely a vasovagal response where he became hypotensive and nauseous. Improved prior to intervention. Currently denies pain or other complaints. Reports being uncomfortable and feeling a little cold. Reports he last had a bloody bowel movement about 2-3 months ago and was admitted and a lot of "tests and research" was done which did not help. Reports compliance with medication. Currently denies headache, dizziness, fever, cough, SOB, chest pain, abdominal pain, vomiting, diarrhea, constipation, dysuria, hematuria, difficulty moving arms/legs, speech difficulty, trouble eating, confusion or any other complaints.  In the ED: Patient initially hypotensive after a large bloody bowel movement in ER and IVF/RBC's ordered but BP improved prior to receiving, otherwise vitals stable. Labs remarkable for CMP and Lactate WNL. Hgb 12.3. Type and Screen completed. Patient was given Reglan, Zofran, 1 u PRBC and 1 L IVF. Called for admission for further monitoring. Hgb likely has not yet equilibrated.   Review of Systems:  All other systems negative unless noted above in HPI.   Past Medical History:  Diagnosis Date  . Acute CVA (cerebrovascular accident) ([place]) 07/25/2015  . Asthma   . Benign essential HTN   . Cerebral infarction due to embolism of left middle cerebral artery ([place]) 02/03/2014  .  Chronic combined systolic and diastolic CHF (congestive heart failure) ([place]) 12/23/2017  . Colitis   . Colon polyps   . Diabetes mellitus type 2, diet-controlled ([place]) 12/22/2013  . Diverticulosis   . GERD (gastroesophageal reflux disease)   . GI bleed   . Hemorrhoids   . Hiatal hernia   . History of esophageal strciture   . HLD (hyperlipidemia)   . Osteoarthritis   . Proctitis   . Status post dilation of esophageal narrowing   . Stroke [facility])    Past Surgical History:  Procedure Laterality Date  . ABDOMINAL HERNIA REPAIR    . BIOPSY  04/05/2019   Procedure: BIOPSY;  Surgeon: Thornton Ollis, MD;  Location: [place];  Service: Gastroenterology;;  . COLONOSCOPY     multiple  . COLONOSCOPY WITH PROPOFOL N/A 12/12/2016   Procedure: COLONOSCOPY WITH PROPOFOL;  Surgeon: Gatha Mayer, MD;  Location: [facility] ENDOSCOPY;  Service: Endoscopy;  Laterality: N/A;  . COLONOSCOPY WITH PROPOFOL N/A 04/04/2019   Procedure: COLONOSCOPY WITH PROPOFOL;  Surgeon: Milus Banister, MD;  Location: [facility] ENDOSCOPY;  Service: Endoscopy;  Laterality: N/A;  . ESOPHAGOGASTRODUODENOSCOPY     multiple  . ESOPHAGOGASTRODUODENOSCOPY (EGD) WITH PROPOFOL N/A 04/05/2019   Procedure: ESOPHAGOGASTRODUODENOSCOPY (EGD) WITH PROPOFOL;  Surgeon: Thornton Perkovich, MD;  Location: [place];  Service: Gastroenterology;  Laterality: N/A;  . INSERTION OF MESH  01/29/2012   Procedure: INSERTION OF MESH;  Surgeon: Gwenyth Ober, MD;  Location: [place];  Service: General;  Laterality: N/A;  . IR ANGIOGRAM SELECTIVE EACH ADDITIONAL VESSEL  04/01/2019  . IR ANGIOGRAM VISCERAL SELECTIVE  04/01/2019  . IR ANGIOGRAM VISCERAL SELECTIVE  04/01/2019  . IR ANGIOGRAM VISCERAL SELECTIVE  04/01/2019  . IR US GUIDE VASC ACCESS RIGHT  04/01/2019  . NISSEN FUNDOPLICATION    . SPLENECTOMY, TOTAL     nontraumatic rupture  . VENTRAL HERNIA REPAIR  01/29/2012    WITH MESH  . VENTRAL HERNIA REPAIR  01/29/2012   Procedure: HERNIA REPAIR VENTRAL  ADULT;  Surgeon: Gwenyth Ober, MD;  Location: [place];  Service: General;  Laterality: N/A;  open recurrent ventral hernia repair with mesh   Social History:  reports that he has never smoked. He has never used smokeless tobacco. He reports current alcohol use of about 2.0 standard drinks of alcohol per week. He reports that he does not use drugs.  Allergies  Allergen Reactions  . Tape Other (See Comments)    SKIN IS SENSITIVE; PLEASE USE PAPER TAPE; SKIN BRUISES AND TEARS EASILY!!  . Azithromycin Rash and Other (See Comments)    Pt had a Z-Pak Jan, 2020 - no reaction(??)    Family History  Problem Relation Age of Onset  . Alzheimer's disease Mother   . Breast cancer Mother   . Throat cancer Father   . Colon cancer Neg Hx   . Colon polyps Neg Hx   . Pancreatic cancer Neg Hx   . Stomach cancer Neg Hx   . Liver disease Neg Hx     Prior to Admission medications   Medication Sig Start Date End Date Taking? Authorizing Provider  albuterol (PROAIR HFA) 108 (90 Base) MCG/ACT inhaler Inhale 2 puffs into the lungs every 6 (six) hours as needed for wheezing or shortness of breath.    [provider]  albuterol (PROVENTIL) (2.5 MG/3ML) 0.083% nebulizer solution Take 3 mLs by nebulization every 6 (six) hours as needed for wheezing or shortness of breath.  10/04/18   [provider]  Alpha-D-Galactosidase (GAS-X PREVENTION) CAPS Take 1 capsule by mouth 2 (two) times daily with a meal. 05/10/19   Milus Banister, MD  budesonide (PULMICORT) 0.5 MG/2ML nebulizer solution Take 2 mLs by nebulization 2 (two) times daily. 09/01/18   [provider]  carvedilol (COREG) 3.125 MG tablet Take 1 tablet (3.125 mg total) by mouth 2 (two) times daily with a meal. 02/26/16   Amin, Jeanella Flattery, MD  clopidogrel (PLAVIX) 75 MG tablet Take 75 mg by mouth daily.  07/05/18   [provider]  diphenhydramine-acetaminophen (TYLENOL PM) 25-500 MG TABS tablet Take 2 tablets by mouth at  bedtime.     [provider]  ferrous sulfate 325 (65 FE) MG tablet Take 1 tablet (325 mg total) by mouth daily. 05/10/19   Milus Banister, MD  fluticasone [facility]) 50 MCG/ACT nasal spray Place 1 spray into both nostrils daily as needed for allergies or rhinitis.  06/18/14   [provider]  Melatonin 10 MG TABS Take 10 mg by mouth at bedtime as needed (for sleep).     [provider]  metFORMIN (GLUCOPHAGE) 1000 MG tablet Take 1,000 mg by mouth daily after breakfast.     [provider]  montelukast (SINGULAIR) 10 MG tablet Take 10 mg by mouth at bedtime as needed (for breathing flares).     [provider]  pantoprazole (PROTONIX) 40 MG tablet Take 1 tablet (40 mg total) by mouth 2 (two) times daily. 04/05/19 06/04/19  Harold Hedge, MD  polyethylene glycol (MIRALAX / Floria Raveling) packet Take 17 g by mouth daily as needed for mild constipation (MIX AND DRINK).  [provider]  psyllium (METAMUCIL) 58.6 % powder Take 1 packet by mouth daily as needed (for constipation- MIX AND DRINK).     [provider]  rOPINIRole (REQUIP) 1 MG tablet Take 2 mg by mouth at bedtime. 02/18/19   [provider]  simvastatin (ZOCOR) 40 MG tablet Take 1 tablet (40 mg total) by mouth daily. Patient taking differently: Take 40 mg by mouth at bedtime.  02/26/16   Damita Lack, MD   Physical Exam: Vitals:   06/19/19 1930 06/19/19 1945 06/19/19 2015 06/19/19 2030  BP: 105/76 113/79 (!) 131/92 (!) 108/96  Pulse: 95 87 80 96  Resp: (!) 28 (!) 22 19 19   Temp:      TempSrc:      SpO2: 95% 94% 100% 98%  Weight:      Height:        Wt Readings from Last 3 Encounters:  06/19/19 68.9 kg  05/10/19 68 kg  04/03/19 74.8 kg    . General:  Appears calm and comfortable. AAOx4. Appears restless in bed and frail.  . Eyes: EOMI, normal lids, irises & conjunctiva . ENT: grossly normal hearing, lips & tongue . Neck: normal ROM . Cardiovascular: RRR,  no m/r/g. No LE edema. Marland Kitchen Respiratory: CTA bilaterally, no w/r/r. Normal respiratory effort. . Abdomen: soft, mild tenderness to palpation in epigastric region only . Skin: no rash or induration seen on limited exam . Musculoskeletal: grossly normal tone BUE/BLE . Psychiatric: grossly normal mood and affect, speech fluent and appropriate . Neurologic: grossly non-focal.         Labs on Admission:  Basic Metabolic Panel: Recent Labs  Lab 06/19/19 1836  NA 135  K 4.4  CL 102  CO2 25  GLUCOSE 154*  BUN 12  CREATININE 0.78  CALCIUM 8.5*   Liver Function Tests: Recent Labs  Lab 06/19/19 1836  AST 17  ALT 11  ALKPHOS 52  BILITOT 0.5  PROT 6.2*  ALBUMIN 3.5   No results for input(s): LIPASE, AMYLASE in the last 168 hours. No results for input(s): AMMONIA in the last 168 hours. CBC: Recent Labs  Lab 06/19/19 1836  WBC 10.4  NEUTROABS 6.1  HGB 12.3*  HCT 40.6  MCV 92.1  PLT 380   Cardiac Enzymes: No results for input(s): CKTOTAL, CKMB, CKMBINDEX, TROPONINI in the last 168 hours.  BNP (last 3 results) No results for input(s): BNP in the last 8760 hours.  ProBNP (last 3 results) No results for input(s): PROBNP in the last 8760 hours.  CBG: No results for input(s): GLUCAP in the last 168 hours.  Radiological Exams on Admission: No results found.  EKG: Independently reviewed. HR 115. Sinus tachycardia. QTc 428. No STEMI. Occasional PVC's.  Assessment/Plan Principal Problem:   GI bleed Active Problems:   History of esophageal strciture   Diverticulosis of colon with hemorrhage   Diabetes mellitus type II, non insulin dependent (HCC)   HLD (hyperlipidemia)   Chronic anticoagulation 2/2 history of CVA   Acute lower GI bleeding   Benign essential HTN   Chronic combined systolic and diastolic CHF (congestive heart failure) (HCC)   Diverticulosis large intestine w/o perforation or abscess w/bleeding  86 y.o. male with PMH of asthma, hyperlipidemia, DM 2, CHF  (EF 45%), h/o stroke, GERD, diverticulosis who presented to ED with hematochezia and admitted for GI bleed.  Recurrent GI bleeding; likely lower GI - several large bloody BM's today; Hgb likely not equilibrated yet (drawn prior to large  bloody BM in ED as well) - Type and Screen done - CBC q6h - Prepare 2 units ahead - Ensure two IV's in place - GI not consulted at this time as patient reports he does not want any interventions besides blood at this time, should this change, GI can be consulted - Last saw GI 3/24: Known diverticulosis which is presumed cause, with last admission:  underwent another battery of tests including nuc med scans, CT angiograms, interventional radiology angiography, colonoscopy, upper endoscopy.  The nuc med bleeding scan suggested possible right colon as a source - Per GI: We had a discussion about further testing and he is wife are both clear that if he has another episode of obvious overt bleeding he would not want more testing being done besides lab tests and blood transfusions.  He said all the testings this past admission really drained him and he is still recovering from that. - NPO - PPI BID ordered due to epigastric tenderness although brisk upper GI bleed less likely  - Hold Plavix  - Will give 1L NS on admission   History of stroke - Resume Plavix when bleeding subsides - Continue simvastatin  Chronic systolic and diastolic CHF - Echocardiogram in 2018 with EF [id number], grade 1 diastolic dysfunction - Clinically asymptomatic  - Holding Coreg in setting of GI bleed and possible hypotension; restart when able   Type 2 diabetes - Holding home Metformin as patient currently NPO  Restless leg syndrome - Continue Requip  Asthma - Continue inhalers, Singulair, Flonase   Code Status: Full DVT Prophylaxis: None; active bleed Family Communication: None Disposition Plan: Admit to inpatient. Patient is at high risk for further decompensation due to age and  co-morbidities. Anticipate discharge in 3-4 days.   Time spent: 70 minutes  Chauncey Mann, MD [facility] Pager [phone]

## 2019-06-19 NOTE — ED Notes (Signed)
Updated pt's wife on plan of care.

## 2019-06-19 NOTE — ED Notes (Signed)
Pt up to [facility] with assistance from ED tech. Pt became pale, diaphoretic, nauseous and hypotensive. Large bloody Bm noted. EDP at bedside. Verbal order from Dr. Rex Kras for emergency release blood, pulled one unit O+ from ED blood cooler.

## 2019-06-19 NOTE — ED Provider Notes (Signed)
[place] EMERGENCY DEPARTMENT Provider Note   CSN: [id number] Arrival date & time: 06/19/19  [id number]     History Chief Complaint  Patient presents with  . GI Bleeding    [name] is a 86 y.o. male.  86yo M w/ extensive PMH including CVA, T2DM, GI bleed, HTN who p/w GI bleeding. Pt has had 4 episodes of bloody bowel movements throughout the day today.  He has had occasional mild dizziness but denies any shortness of breath, syncope, or pain including no abdominal pain.  This has happened in the past and extensive work-up did not reveal source of bleeding.  Patient has expressed to his gastroenterologist that he does not want any invasive work-ups but would be amenable to medical treatment including blood transfusion if necessary.  He denies any anticoagulant use aside from Plavix.  The history is provided by the patient.       Past Medical History:  Diagnosis Date  . Acute CVA (cerebrovascular accident) ([place]) 07/25/2015  . Asthma   . Benign essential HTN   . Cerebral infarction due to embolism of left middle cerebral artery ( [place]) 02/03/2014  . Chronic combined systolic and diastolic CHF (congestive heart failure) ([place]) 12/23/2017  . Colitis   . Colon polyps   . Diabetes mellitus type 2, diet-controlled ([place]) 12/22/2013  . Diverticulosis   . GERD (gastroesophageal reflux disease)   . GI bleed   . Hemorrhoids   . Hiatal hernia   . History of esophageal strciture   . HLD (hyperlipidemia)   . Osteoarthritis   . Proctitis   . Status post dilation of esophageal narrowing   . Stroke [facility])     Patient Active Problem List   Diagnosis Date Noted  . Protein-calorie malnutrition, severe ([place]) 04/01/2019  . DNR (do not resuscitate) 04/01/2019  . Rectal bleed 08/21/2018  . Platelet inhibition due to Plavix   . Rectal bleeding   . Diverticulosis large intestine w/o perforation or abscess w/bleeding   . GERD (gastroesophageal reflux disease) 04/21/2018  .  Hematochezia   . Hypokalemia 12/25/2017  . Diverticulosis   . Acute blood loss anemia 12/24/2017  . Syncope due to orthostatic hypotension 12/23/2017  . Chronic combined systolic and diastolic CHF (congestive heart failure) ([place]) 12/23/2017  . GI bleed 10/25/2016  . BPPV (benign paroxysmal positional vertigo) 10/25/2016  . Lower GI bleed 10/24/2016  . Syncope 10/24/2016  . TIA (transient ischemic attack) 07/22/2016  . Benign essential HTN   . PAF (paroxysmal atrial fibrillation) ([place])   . Stroke-like symptom 02/22/2016  . Leukocytosis 02/22/2016  . Acute lower GI bleeding 02/01/2016  . History of lower GI bleeding Dec 2017 02/01/2016  . Chronic anticoagulation 2/2 history of CVA 01/31/2016  . History of CVA (cerebrovascular accident) 01/31/2016  . Sinusitis, chronic 12/12/2015  . Cough variant asthma 10/31/2015  . Insomnia 07/25/2015  . HLD (hyperlipidemia) 02/03/2014  . Ulnar neuropathy at elbow of right upper extremity 02/03/2014  . Cervical radiculopathy 02/03/2014  . Seizures ([place])   . Diabetes mellitus type II, non insulin dependent ([place]) 12/22/2013  . Recurrent ventral hernia 12/15/2011  . History of esophageal strciture   . Diverticulosis of colon with hemorrhage 06/24/2007    Past Surgical History:  Procedure Laterality Date  . ABDOMINAL HERNIA REPAIR    . BIOPSY  04/05/2019   Procedure: BIOPSY;  Surgeon: Thornton Tubbs, MD;  Location: [place];  Service: Gastroenterology;;  . COLONOSCOPY     multiple  . COLONOSCOPY  WITH PROPOFOL N/A 12/12/2016   Procedure: COLONOSCOPY WITH PROPOFOL;  Surgeon: Gatha Mayer, MD;  Location: [facility] ENDOSCOPY;  Service: Endoscopy;  Laterality: N/A;  . COLONOSCOPY WITH PROPOFOL N/A 04/04/2019   Procedure: COLONOSCOPY WITH PROPOFOL;  Surgeon: Milus Banister, MD;  Location: [facility] ENDOSCOPY;  Service: Endoscopy;  Laterality: N/A;  . ESOPHAGOGASTRODUODENOSCOPY     multiple  . ESOPHAGOGASTRODUODENOSCOPY (EGD) WITH PROPOFOL N/A 04/05/2019     Procedure: ESOPHAGOGASTRODUODENOSCOPY (EGD) WITH PROPOFOL;  Surgeon: Thornton Wunder, MD;  Location: [place];  Service: Gastroenterology;  Laterality: N/A;  . INSERTION OF MESH  01/29/2012   Procedure: INSERTION OF MESH;  Surgeon: Gwenyth Ober, MD;  Location: [place];  Service: General;  Laterality: N/A;  . IR ANGIOGRAM SELECTIVE EACH ADDITIONAL VESSEL  04/01/2019  . IR ANGIOGRAM VISCERAL SELECTIVE  04/01/2019  . IR ANGIOGRAM VISCERAL SELECTIVE  04/01/2019  . IR ANGIOGRAM VISCERAL SELECTIVE  04/01/2019  . IR US GUIDE VASC ACCESS RIGHT  04/01/2019  . NISSEN FUNDOPLICATION    . SPLENECTOMY, TOTAL     nontraumatic rupture  . VENTRAL HERNIA REPAIR  01/29/2012    WITH MESH  . VENTRAL HERNIA REPAIR  01/29/2012   Procedure: HERNIA REPAIR VENTRAL ADULT;  Surgeon: Gwenyth Ober, MD;  Location: [place];  Service: General;  Laterality: N/A;  open recurrent ventral hernia repair with mesh       Family History  Problem Relation Age of Onset  . Alzheimer's disease Mother   . Breast cancer Mother   . Throat cancer Father   . Colon cancer Neg Hx   . Colon polyps Neg Hx   . Pancreatic cancer Neg Hx   . Stomach cancer Neg Hx   . Liver disease Neg Hx     Social History   Tobacco Use  . Smoking status: Never Smoker  . Smokeless tobacco: Never Used  Substance Use Topics  . Alcohol use: Yes    Alcohol/week: 2.0 standard drinks    Types: 2 Glasses of wine per week    Comment: daily  . Drug use: No    Home Medications Prior to Admission medications   Medication Sig Start Date End Date Taking? Authorizing Provider  albuterol (PROAIR HFA) 108 (90 Base) MCG/ACT inhaler Inhale 2 puffs into the lungs every 6 (six) hours as needed for wheezing or shortness of breath.    [provider]  albuterol (PROVENTIL) (2.5 MG/3ML) 0.083% nebulizer solution Take 3 mLs by nebulization every 6 (six) hours as needed for wheezing or shortness of breath.  10/04/18   [provider]   Alpha-D-Galactosidase (GAS-X PREVENTION) CAPS Take 1 capsule by mouth 2 (two) times daily with a meal. 05/10/19   Milus Banister, MD  budesonide (PULMICORT) 0.5 MG/2ML nebulizer solution Take 2 mLs by nebulization 2 (two) times daily. 09/01/18   [provider]  carvedilol (COREG) 3.125 MG tablet Take 1 tablet (3.125 mg total) by mouth 2 (two) times daily with a meal. 02/26/16   Amin, Jeanella Flattery, MD  clopidogrel (PLAVIX) 75 MG tablet Take 75 mg by mouth daily.  07/05/18   [provider]  diphenhydramine-acetaminophen (TYLENOL PM) 25-500 MG TABS tablet Take 2 tablets by mouth at bedtime.     [provider]  ferrous sulfate 325 (65 FE) MG tablet Take 1 tablet (325 mg total) by mouth daily. 05/10/19   Milus Banister, MD  fluticasone [facility]) 50 MCG/ACT nasal spray Place 1 spray into both nostrils daily as needed for  allergies or rhinitis.  06/18/14   [provider]  Melatonin 10 MG TABS Take 10 mg by mouth at bedtime as needed (for sleep).     [provider]  metFORMIN (GLUCOPHAGE) 1000 MG tablet Take 1,000 mg by mouth daily after breakfast.     [provider]  montelukast (SINGULAIR) 10 MG tablet Take 10 mg by mouth at bedtime as needed (for breathing flares).     [provider]  pantoprazole (PROTONIX) 40 MG tablet Take 1 tablet (40 mg total) by mouth 2 (two) times daily. 04/05/19 06/04/19  Harold Hedge, MD  polyethylene glycol (MIRALAX / Floria Raveling) packet Take 17 g by mouth daily as needed for mild constipation (MIX AND DRINK).     [provider]  psyllium (METAMUCIL) 58.6 % powder Take 1 packet by mouth daily as needed (for constipation- MIX AND DRINK).     [provider]  rOPINIRole (REQUIP) 1 MG tablet Take 2 mg by mouth at bedtime. 02/18/19   [provider]  simvastatin (ZOCOR) 40 MG tablet Take 1 tablet (40 mg total) by mouth daily. Patient taking differently: Take 40 mg by mouth at bedtime.  02/26/16    Damita Lack, MD    Allergies    Tape and Azithromycin  Review of Systems   Review of Systems All other systems reviewed and are negative except that which was mentioned in HPI  Physical Exam Updated Vital Signs BP (!) 131/92   Pulse 80   Temp (!) 97.3 F (36.3 C) (Oral)   Resp 19   Ht 5\' 10"  (1.778 m)   Wt 68.9 kg   SpO2 100%   BMI 21.81 kg/m   Physical Exam Vitals and nursing note reviewed.  Constitutional:      General: He is not in acute distress.    Appearance: He is well-developed.  HENT:     Head: Normocephalic and atraumatic.  Eyes:     Conjunctiva/sclera: Conjunctivae normal.  Cardiovascular:     Rate and Rhythm: Regular rhythm. Tachycardia present.     Heart sounds: Normal heart sounds. No murmur.  Pulmonary:     Effort: Pulmonary effort is normal.     Breath sounds: Normal breath sounds.  Abdominal:     General: Bowel sounds are normal. There is no distension.     Palpations: Abdomen is soft.     Tenderness: There is no abdominal tenderness.  Musculoskeletal:     Cervical back: Neck supple.     Right lower leg: No edema.     Left lower leg: No edema.  Skin:    General: Skin is warm and dry.  Neurological:     Mental Status: He is alert and oriented to person, place, and time.     Comments: Fluent speech  Psychiatric:        Judgment: Judgment normal.     ED Results / Procedures / Treatments   Labs (all labs ordered are listed, but only abnormal results are displayed) Labs Reviewed  COMPREHENSIVE METABOLIC PANEL - Abnormal; Notable for the following components:      Result Value   Glucose, Bld 154 (*)    Calcium 8.5 (*)    Total Protein 6.2 (*)    All other components within normal limits  CBC WITH DIFFERENTIAL/PLATELET - Abnormal; Notable for the following components:   Hemoglobin 12.3 (*)    RDW 17.2 (*)    All other components within normal limits  RESPIRATORY PANEL BY  RT PCR (FLU A&B, COVID)  LACTIC ACID, PLASMA  TYPE AND  SCREEN  PREPARE RBC (CROSSMATCH)    EKG EKG Interpretation  Date/Time:  Monday Jun 19 2019 18:27:07 EDT Ventricular Rate:  115 PR Interval:    QRS Duration: 80 QT Interval:  309 QTC Calculation: 428 R Axis:   -101 Text Interpretation: Sinus tachycardia Ventricular premature complex Inferior infarct, old occasional PVC Confirmed by Theotis Burrow [phone]) on 06/19/2019 7:08:37 PM   Radiology No results found.  Procedures .Critical Care Performed by: Sharlett Iles, MD Authorized by: Sharlett Iles, MD   Critical care provider statement:    Critical care time (minutes):  30   Critical care was necessary to treat or prevent imminent or life-threatening deterioration of the following conditions: GI bleed.   Critical care was time spent personally by me on the following activities:  Development of treatment plan with patient or surrogate, evaluation of patient's response to treatment, examination of patient, obtaining history from patient or surrogate, ordering and performing treatments and interventions, ordering and review of laboratory studies, re-evaluation of patient's condition and review of old charts   (including critical care time)  Medications Ordered in ED Medications  0.9 %  sodium chloride infusion (0 mL/hr Intravenous Hold 06/19/19 2001)  ondansetron (ZOFRAN) injection 4 mg (4 mg Intravenous Given 06/19/19 1933)  sodium chloride 0.9 % bolus 1,000 mL (1,000 mLs Intravenous New Bag/Given 06/19/19 1933)  metoCLOPramide (REGLAN) injection 10 mg (10 mg Intravenous Given 06/19/19 2008)    ED Course  I have reviewed the triage vital signs and the nursing notes.  Pertinent labs that were available during my care of the patient were reviewed by me and considered in my medical decision making (see chart for details).    MDM Rules/Calculators/A&P                      Patient was alert and comfortable on exam, BP 133/91.  Mildly tachycardic.  No abdominal tenderness  or complaints of abdominal pain.  I reviewed his chart which shows previous GI bleed with extensive work-up which was ultimately unrevealing.  He has a note from today documenting that he does not want invasive GI evaluation. Pt confirmed this verbally with me. He voiced understanding of risks/benefits of blood transfusion and is amenable to transfusion if necessary.  While in the ED, the patient needed to have a bowel movement and passed approximately 600 mL frank blood. He immediately became diaphoretic, pale, and lightheaded.  Moved back to his bed where he was noted to be hypotensive. He also began having N/V. Immediately began transfusing 1 unit emergency release blood. Before blood transfusion completed, his BP quickly normalized. I suspect he had a vagal episode during BM which was compounded by the blood loss.  Labs show Hbg 12.3 although likely doesn't reflect acute blood loss. Creatinine normal. His BP has held normal and initial tachycardia at triage has resolved. Discussed admission w/ [facility], Dr. Marice Potter.  Final Clinical Impression(s) / ED Diagnoses Final diagnoses:  Lower GI bleed    Rx / DC Orders ED Discharge Orders    None       Tabia Landowski, Wenda Overland, MD 06/19/19 2026

## 2019-06-19 NOTE — ED Triage Notes (Signed)
Pt BIB [facility] from home, c/o 5 episodes of bloody BMs that started today, hx of the same. Reports mild abdominal discomfort, denies dizziness or shortness of breath. Currently taking plavix.

## 2019-06-20 LAB — CBC
HCT: 33.9 % — ABNORMAL LOW (ref 39.0–52.0)
HCT: 33 % — ABNORMAL LOW (ref 39.0–52.0)
HCT: 32.8 % — ABNORMAL LOW (ref 39.0–52.0)
Hemoglobin: 10.5 g/dL — ABNORMAL LOW (ref 13.0–17.0)
Hemoglobin: 10.3 g/dL — ABNORMAL LOW (ref 13.0–17.0)
Hemoglobin: 10.3 g/dL — ABNORMAL LOW (ref 13.0–17.0)
MCH: 28.4 pg (ref 26.0–34.0)
MCH: 28.3 pg (ref 26.0–34.0)
MCH: 28.7 pg (ref 26.0–34.0)
MCHC: 31 g/dL (ref 30.0–36.0)
MCHC: 31.2 g/dL (ref 30.0–36.0)
MCHC: 31.4 g/dL (ref 30.0–36.0)
MCV: 91.6 fL (ref 80.0–100.0)
MCV: 90.7 fL (ref 80.0–100.0)
MCV: 91.4 fL (ref 80.0–100.0)
Platelets: 289 10*3/uL (ref 150–400)
Platelets: 293 10*3/uL (ref 150–400)
Platelets: 298 10*3/uL (ref 150–400)
RBC: 3.7 MIL/uL — ABNORMAL LOW (ref 4.22–5.81)
RBC: 3.64 MIL/uL — ABNORMAL LOW (ref 4.22–5.81)
RBC: 3.59 MIL/uL — ABNORMAL LOW (ref 4.22–5.81)
RDW: 17 % — ABNORMAL HIGH (ref 11.5–15.5)
RDW: 17.2 % — ABNORMAL HIGH (ref 11.5–15.5)
RDW: 17.1 % — ABNORMAL HIGH (ref 11.5–15.5)
WBC: 11.5 10*3/uL — ABNORMAL HIGH (ref 4.0–10.5)
WBC: 10 10*3/uL (ref 4.0–10.5)
WBC: 9.8 10*3/uL (ref 4.0–10.5)
nRBC: 0 % (ref 0.0–0.2)
nRBC: 0 % (ref 0.0–0.2)
nRBC: 0 % (ref 0.0–0.2)

## 2019-06-20 MED ORDER — ZOLPIDEM TARTRATE 5 MG PO TABS
2.5000 mg | ORAL_TABLET | Freq: Every evening | ORAL | Status: DC | PRN
  Administered 2019-06-20: 2.5 mg via ORAL
  Filled 2019-06-20: qty 1

## 2019-06-20 MED ORDER — ALBUTEROL SULFATE (2.5 MG/3ML) 0.083% IN NEBU: 2.5000 mg | INHALATION_SOLUTION | Freq: Four times a day (QID) | RESPIRATORY_TRACT | Status: DC | PRN

## 2019-06-20 MED ORDER — CARVEDILOL 3.125 MG PO TABS
3.1250 mg | ORAL_TABLET | Freq: Two times a day (BID) | ORAL | Status: DC
  Administered 2019-06-20 – 2019-06-22 (×4): 3.125 mg via ORAL
  Filled 2019-06-20 (×4): qty 1

## 2019-06-20 NOTE — Progress Notes (Signed)
Progress Note    [name]  [id number] DOB: 28-Mar-1931  DOA: 06/19/2019 PCP: Seward Carol, MD    Brief Narrative:     Medical records reviewed and are as summarized below:  Virgina [name] is an 86 y.o. male with PMH of asthma, hyperlipidemia, DM 2, CHF (EF 45%), h/o stroke, GERD, diverticulosis who presented to ED with hematochezia and admitted for GI bleed.  Patient reports that he was feeling well today until about 1500 when he had a large bloody bowel movement. This happened about 3 more times and he called EMS. Upon arrival, patient with another large bloody bowel movement and subsequently had likely a vasovagal response where he became hypotensive and nauseous. Improved prior to intervention and has had no further bloody bowel movements  Assessment/Plan:   Principal Problem:   GI bleed Active Problems:   History of esophageal strciture   Diverticulosis of colon with hemorrhage   Diabetes mellitus type II, non insulin dependent (HCC)   HLD (hyperlipidemia)   Chronic anticoagulation 2/2 history of CVA   Acute lower GI bleeding   Benign essential HTN   Chronic combined systolic and diastolic CHF (congestive heart failure) (H[facility])   Diverticulosis large intestine w/o perforation or abscess w/bleeding   Recurrent GI bleeding; likely lower GI - several large bloody BM's today; Hgb likely not equilibrated yet (drawn prior to large bloody BM in ED as well) - Type and Screen done - CBC every 12 - Prepare 2 units ahead -- Last saw GI 3/24: Known diverticulosis which is presumed cause, with last admission: underwent another battery of tests including nuc med scans, CT angiograms, interventional radiology angiography, colonoscopy, upper endoscopy. The nuc med bleeding scan suggested possible right colon as a source - Per GI: We had a discussion about further testing and he is wife are both clear that if he has another episode of obvious overt bleeding he would not want more  testing being done besides lab tests and blood transfusions. He said all the testings this past admission really drained him and he is still recovering from that. -Will advance diet and recheck CBC this p.m. -Suspect patient can be discharged in the morning  History of stroke - Resume Plavix when bleeding subsides - Continue simvastatin  Chronic systolic and diastolic CHF - Echocardiogram in 2018 with EF [id number], grade 1 diastolic dysfunction - Clinically asymptomatic   Type 2 diabetes - Start sliding scale insulin if indicated  Restless leg syndrome - Continue Requip  Asthma - Continue inhalers, Singulair, Flonase    Family Communication/Anticipated D/C date and plan/Code Status   DVT prophylaxis: SCDs Code Status: Full Code (all past visits patient has been a DNR so will revisit this tomorrow) Disposition Plan: Status is: Inpatient  Remains inpatient appropriate because:Inpatient level of care appropriate due to severity of illness   Dispo: The patient is from: Home              Anticipated d/c is to: Home              Anticipated d/c date is: 1 day              Patient currently is not medically stable to d/c.- if CBC stable, can be d/c'd in the AM   Medical Consultants:    GI     Subjective:   No further blood in his bowel movements  Objective:    Vitals:   06/20/19 1200 06/20/19 1215 06/20/19 1250 06/20/19  1300  BP: 125/79 138/75 (!) 164/81   Pulse: 79 83 86   Resp: 17 17 16    Temp:   98.2 F (36.8 C)   TempSrc:      SpO2: 97% 97% 100%   Weight:   66.8 kg 66.2 kg  Height:   5\' 10"  (1.778 m) 5\' 8"  (1.727 m)    Intake/Output Summary (Last 24 hours) at 06/20/2019 1348 Last data filed at 06/20/2019 1300 Gross per 24 hour  Intake 240 ml  Output 300 ml  Net -60 ml   Filed Weights   06/19/19 1828 06/20/19 1250 06/20/19 1300  Weight: 68.9 kg 66.8 kg 66.2 kg    Exam:  General: Appearance:    Well developed, well nourished male in no acute  distress  Eyes:    PERRL, conjunctiva/corneas clear, EOM's intact       Lungs:     Clear to auscultation bilaterally, respirations unlabored  Heart:    Normal heart rate. Regular rhythm. No murmurs, rubs, or gallops.   MS:   All extremities are intact.   Neurologic:   Awake, alert, oriented x 3. No apparent focal neurological           defect.     Data Reviewed:   I have personally reviewed following labs and imaging studies:  Labs: Labs show the following:   Basic Metabolic Panel: Recent Labs  Lab 06/19/19 1836  NA 135  K 4.4  CL 102  CO2 25  GLUCOSE 154*  BUN 12  CREATININE 0.78  CALCIUM 8.5*   GFR Estimated Creatinine Clearance: 63.2 mL/min (by C-G formula based on SCr of 0.78 mg/dL). Liver Function Tests: Recent Labs  Lab 06/19/19 1836  AST 17  ALT 11  ALKPHOS 52  BILITOT 0.5  PROT 6.2*  ALBUMIN 3.5   No results for input(s): LIPASE, AMYLASE in the last 168 hours. No results for input(s): AMMONIA in the last 168 hours. Coagulation profile No results for input(s): INR, PROTIME in the last 168 hours.  CBC: Recent Labs  Lab 06/19/19 1836 06/19/19 2110 06/20/19 0310 06/20/19 0933  WBC 10.4 13.8* 11.5* 10.0  NEUTROABS 6.1  --   --   --   HGB 12.3* 12.1* 10.5* 10.3*  HCT 40.6 39.5 33.9* 33.0*  MCV 92.1 92.7 91.6 90.7  PLT 380 323 289 293   Cardiac Enzymes: No results for input(s): CKTOTAL, CKMB, CKMBINDEX, TROPONINI in the last 168 hours. BNP (last 3 results) No results for input(s): PROBNP in the last 8760 hours. CBG: No results for input(s): GLUCAP in the last 168 hours. D-Dimer: No results for input(s): DDIMER in the last 72 hours. Hgb A1c: No results for input(s): HGBA1C in the last 72 hours. Lipid Profile: No results for input(s): CHOL, HDL, LDLCALC, TRIG, CHOLHDL, LDLDIRECT in the last 72 hours. Thyroid function studies: No results for input(s): TSH, T4TOTAL, T3FREE, THYROIDAB in the last 72 hours.  Invalid input(s): FREET3 Anemia work  up: No results for input(s): VITAMINB12, FOLATE, FERRITIN, TIBC, IRON, RETICCTPCT in the last 72 hours. Sepsis Labs: Recent Labs  Lab 06/19/19 1836 06/19/19 2110 06/20/19 0310 06/20/19 0933  WBC 10.4 13.8* 11.5* 10.0  LATICACIDVEN 1.9  --   --   --     Microbiology Recent Results (from the past 240 hour(s))  Respiratory Panel by RT PCR (Flu A&B, Covid) - Nasopharyngeal Swab     Status: None   Collection Time: 06/19/19  8:11 PM   Specimen: Nasopharyngeal Swab  Result Value Ref Range Status   SARS Coronavirus 2 by RT PCR NEGATIVE NEGATIVE Final    Comment: (NOTE) SARS-CoV-2 target nucleic acids are NOT DETECTED. The SARS-CoV-2 RNA is generally detectable in upper respiratoy specimens during the acute phase of infection. The lowest concentration of SARS-CoV-2 viral copies this assay can detect is 131 copies/mL. A negative result does not preclude SARS-Cov-2 infection and should not be used as the sole basis for treatment or other patient management decisions. A negative result may occur with  improper specimen collection/handling, submission of specimen other than nasopharyngeal swab, presence of viral mutation(s) within the areas targeted by this assay, and inadequate number of viral copies (<131 copies/mL). A negative result must be combined with clinical observations, patient history, and epidemiological information. The expected result is Negative. Fact Sheet for Patients:  PinkCheek.be Fact Sheet for Healthcare Providers:  GravelBags.it This test is not yet ap proved or cleared by the [place] FDA and  has been authorized for detection and/or diagnosis of SARS-CoV-2 by FDA under an Emergency Use Authorization (EUA). This EUA will remain  in effect (meaning this test can be used) for the duration of the COVID-19 declaration under Section 564(b)(1) of the Act, 21 U.S.C. section 360bbb-3(b)(1), unless the  authorization is terminated or revoked sooner.    Influenza A by PCR NEGATIVE NEGATIVE Final   Influenza B by PCR NEGATIVE NEGATIVE Final    Comment: (NOTE) The Xpert Xpress SARS-CoV-2/FLU/RSV assay is intended as an aid in  the diagnosis of influenza from Nasopharyngeal swab specimens and  should not be used as a sole basis for treatment. Nasal washings and  aspirates are unacceptable for Xpert Xpress SARS-CoV-2/FLU/RSV  testing. Fact Sheet for Patients: PinkCheek.be Fact Sheet for Healthcare Providers: GravelBags.it This test is not yet approved or cleared by the [place] FDA and  has been authorized for detection and/or diagnosis of SARS-CoV-2 by  FDA under an Emergency Use Authorization (EUA). This EUA will remain  in effect (meaning this test can be used) for the duration of the  Covid-19 declaration under Section 564(b)(1) of the Act, 21  U.S.C. section 360bbb-3(b)(1), unless the authorization is  terminated or revoked. Performed at [facility], [place] [address], [place], [place] [address]     Procedures and diagnostic studies:  No results found.  Medications:   . budesonide  2 mL Nebulization BID  . pantoprazole (PROTONIX) IV  40 mg Intravenous Q12H  . rOPINIRole  2 mg Oral QHS  . simvastatin  40 mg Oral QHS   Continuous Infusions: . sodium chloride Stopped (06/19/19 2001)     LOS: 1 day   Geradine Girt  [facility]   How to contact the [facility] Attending or Consulting provider [place] or covering provider during after hours [place], for this patient?  1. Check the care team in [facility] and look for a) attending/consulting [facility] provider listed and b) the [facility] team listed 2. Log into [email or web address] and use [place]'s universal password to access. If you do not have the password, please contact the hospital operator. 3. Locate the [facility] provider you are looking for under [facility] and page  to a number that you can be directly reached. 4. If you still have difficulty reaching the provider, please page the [facility] (Director on Call) for the Hospitalists listed on amion for assistance.  06/20/2019, 1:48 PM

## 2019-06-20 NOTE — Progress Notes (Signed)
Pt oriented to room, able to call wife and updated her on room change. No needs at thispoint

## 2019-06-20 NOTE — Evaluation (Signed)
Physical Therapy Evaluation Patient Details Name: [name] MRN: [id number] DOB: Oct 10, 1931 Today's Date: 06/20/2019   History of Present Illness  [name] is an 86 y.o. male with PMH of asthma, hyperlipidemia, DM 2, CHF (EF 45%), h/o stroke, GERD, diverticulosis who presented to ED with hematochezia and admitted for GI bleed.  Hgb 10.5 at this time.  Clinical Impression  Pt admitted with above diagnosis. Pt presenting with near baseline mobility.  Did need min cues for safety and min guard for safety with transfers.  Pt ws able to ambulate 30' with RW.  Pt with mild unsteadiness but no overt LOB.  Pt with no PT needs at d/c. Marland Kitchen Pt currently with functional limitations due to the deficits listed below (see PT Problem List). Pt will benefit from skilled PT to increase their independence and safety with mobility to allow discharge to the venue listed below.       Follow Up Recommendations No PT follow up    Equipment Recommendations  None recommended by PT    Recommendations for Other Services       Precautions / Restrictions Precautions Precautions: None Restrictions Weight Bearing Restrictions: No      Mobility  Bed Mobility Overal bed mobility: Needs Assistance Bed Mobility: Supine to Sit;Sit to Supine     Supine to sit: Supervision Sit to supine: Supervision      Transfers Overall transfer level: Needs assistance Equipment used: Rolling walker (2 wheeled) Transfers: Sit to/from Stand Sit to Stand: Min guard         General transfer comment: cues for safe hand placement  Ambulation/Gait Ambulation/Gait assistance: Min guard Gait Distance (Feet): 80 Feet Assistive device: Rolling walker (2 wheeled) Gait Pattern/deviations: Wide base of support;Decreased stance time - left Gait velocity: decreased   General Gait Details: tends to supinate L foot and circumduct hip - reports normally wears AFO  Stairs            Wheelchair Mobility    Modified  Rankin (Stroke Patients Only)       Balance Overall balance assessment: Needs assistance   Sitting balance-Leahy Scale: Normal       Standing balance-Leahy Scale: Good Standing balance comment: uses RW but could maintain static standing without AD                             Pertinent Vitals/Pain Pain Assessment: No/denies pain    Home Living Family/patient expects to be discharged to:: Private residence Living Arrangements: Spouse/significant other Available Help at Discharge: Family;Available 24 hours/day Type of Home: Independent living facility([facility]) Home Access: Level entry     Home Layout: One level Home Equipment: Grab bars - tub/shower;Grab bars - toilet;Walker - 2 wheels;Walker - 4 wheels;Wheelchair - manual;Hand held shower head;Shower seat - built in      Prior Function Level of Independence: Independent with assistive device(s)         Comments: Uses rollator - can walk community distances if he has to but typically does not; independent with ADLs and IADLS     Hand Dominance        Extremity/Trunk Assessment   Upper Extremity Assessment Upper Extremity Assessment: LUE deficits/detail;RUE deficits/detail RUE Deficits / Details: ROM WFL; MMT 5/5 LUE Deficits / Details: ROM WFL; MMT 4/5 (reports from previous CVA)    Lower Extremity Assessment Lower Extremity Assessment: LLE deficits/detail;RLE deficits/detail RLE Deficits / Details: ROM WFL; MMT 5/5 LLE  Deficits / Details: ROM WFL but with increased time: MMT 4/5 also pt tends to supinate L foot w ambulation       Communication   Communication: No difficulties  Cognition Arousal/Alertness: Awake/alert Behavior During Therapy: WFL for tasks assessed/performed Overall Cognitive Status: Within Functional Limits for tasks assessed                                        General Comments General comments (skin integrity, edema, etc.): Pt denied dizziness with  all transfers ;VSS    Exercises     Assessment/Plan    PT Assessment Patient needs continued PT services  PT Problem List Decreased strength;Decreased mobility;Decreased safety awareness;Decreased activity tolerance;Decreased balance;Decreased knowledge of use of DME       PT Treatment Interventions DME instruction;Therapeutic activities;Gait training;Therapeutic exercise;Patient/family education;Balance training;Functional mobility training    PT Goals (Current goals can be found in the Care Plan section)  Acute Rehab PT Goals Patient Stated Goal: return home - hopefully tomorrow PT Goal Formulation: With patient Time For Goal Achievement: 07/04/19 Potential to Achieve Goals: Good    Frequency Min 3X/week   Barriers to discharge        Co-evaluation               AM-PAC PT "6 Clicks" Mobility  Outcome Measure Help needed turning from your back to your side while in a flat bed without using bedrails?: None Help needed moving from lying on your back to sitting on the side of a flat bed without using bedrails?: None Help needed moving to and from a bed to a chair (including a wheelchair)?: None Help needed standing up from a chair using your arms (e.g., wheelchair or bedside chair)?: None Help needed to walk in hospital room?: None Help needed climbing 3-5 steps with a railing? : A Little 6 Click Score: 23    End of Session Equipment Utilized During Treatment: Gait belt Activity Tolerance: Patient tolerated treatment well Patient left: in bed;with call bell/phone within reach;with bed alarm set Nurse Communication: Mobility status PT Visit Diagnosis: Other abnormalities of gait and mobility (R26.89);Muscle weakness (generalized) (M62.81)    Time: 1450-1511 PT Time Calculation (min) (ACUTE ONLY): 21 min   Charges:   PT Evaluation $PT Eval Low Complexity: 1 Low          Maggie Font, PT Acute Rehab Services Pager [phone] [facility]  [phone] [facility] [phone]   Karlton Lemon 06/20/2019, 3:23 PM

## 2019-06-20 NOTE — ED Notes (Signed)
Pt repositioned and given a urinal stating he needed to pee. Pts blood pressure cuff repositioned and retaken as it was running low.

## 2019-06-21 DIAGNOSIS — I10 Essential (primary) hypertension: Secondary | ICD-10-CM

## 2019-06-21 DIAGNOSIS — I5042 Chronic combined systolic (congestive) and diastolic (congestive) heart failure: Secondary | ICD-10-CM

## 2019-06-21 DIAGNOSIS — E119 Type 2 diabetes mellitus without complications: Secondary | ICD-10-CM

## 2019-06-21 DIAGNOSIS — K922 Gastrointestinal hemorrhage, unspecified: Secondary | ICD-10-CM

## 2019-06-21 DIAGNOSIS — K5731 Diverticulosis of large intestine without perforation or abscess with bleeding: Principal | ICD-10-CM

## 2019-06-21 LAB — CBC
HCT: 31.1 % — ABNORMAL LOW (ref 39.0–52.0)
Hemoglobin: 9.9 g/dL — ABNORMAL LOW (ref 13.0–17.0)
MCH: 28.9 pg (ref 26.0–34.0)
MCHC: 31.8 g/dL (ref 30.0–36.0)
MCV: 90.7 fL (ref 80.0–100.0)
Platelets: 287 10*3/uL (ref 150–400)
RBC: 3.43 MIL/uL — ABNORMAL LOW (ref 4.22–5.81)
RDW: 17 % — ABNORMAL HIGH (ref 11.5–15.5)
WBC: 9.6 10*3/uL (ref 4.0–10.5)
nRBC: 0 % (ref 0.0–0.2)

## 2019-06-21 LAB — BASIC METABOLIC PANEL
Anion gap: 6 (ref 5–15)
BUN: 12 mg/dL (ref 8–23)
CO2: 27 mmol/L (ref 22–32)
Calcium: 8.4 mg/dL — ABNORMAL LOW (ref 8.9–10.3)
Chloride: 104 mmol/L (ref 98–111)
Creatinine, Ser: 0.79 mg/dL (ref 0.61–1.24)
GFR calc Af Amer: >60 mL/min (ref >60)
GFR calc non Af Amer: >60 mL/min (ref >60)
Glucose, Bld: 127 mg/dL — ABNORMAL HIGH (ref 70–99)
Potassium: 3.6 mmol/L (ref 3.5–5.1)
Sodium: 137 mmol/L (ref 135–145)

## 2019-06-21 MED ORDER — POLYETHYLENE GLYCOL 3350 17 G PO PACK
17.0000 g | PACK | Freq: Two times a day (BID) | ORAL | Status: AC
  Administered 2019-06-21 (×2): 17 g via ORAL
  Filled 2019-06-21 (×2): qty 1

## 2019-06-21 MED ORDER — PSYLLIUM 95 % PO PACK
1.0000 | PACK | Freq: Every day | ORAL | Status: DC
  Administered 2019-06-21: 1 via ORAL
  Filled 2019-06-21 (×2): qty 1

## 2019-06-21 MED ORDER — BUDESONIDE 0.5 MG/2ML IN SUSP
2.0000 mL | Freq: Two times a day (BID) | RESPIRATORY_TRACT | Status: DC
  Administered 2019-06-21 – 2019-06-22 (×2): 0.5 mg via RESPIRATORY_TRACT
  Filled 2019-06-21 (×2): qty 2

## 2019-06-21 NOTE — Evaluation (Signed)
Occupational Therapy Evaluation Patient Details Name: [name] MRN: [id number] DOB: 1931/08/22 Today's Date: 06/21/2019    History of Present Illness [name] is an 86 y.o. male with PMH of asthma, hyperlipidemia, DM 2, CHF (EF 45%), h/o stroke, GERD, diverticulosis who presented to ED with hematochezia and admitted for GI bleed.  Hgb 10.5 at this time.   Clinical Impression   PTA pt living with spouse, independent for BADL. Pt has history of stroke with L sided weakness, but is functional. At time of eval, pt completing BADL at supervision level and sit <> stands at min guard assist with RW. Pt able to complete functional mobility a household distance with RW at min guard level. He then completed standing grooming with set up assist and finished session in chair. Pt appears relatively close to baseline. Will continue to follow while acute to progress activity tolerance, no post acute OT needed.     Follow Up Recommendations  No OT follow up    Equipment Recommendations  None recommended by OT    Recommendations for Other Services       Precautions / Restrictions Precautions Precautions: None Precaution Comments: L foot drop at baseline Restrictions Weight Bearing Restrictions: No      Mobility Bed Mobility Overal bed mobility: Needs Assistance Bed Mobility: Supine to Sit     Supine to sit: Supervision        Transfers Overall transfer level: Needs assistance Equipment used: Rolling walker (2 wheeled) Transfers: Sit to/from Stand Sit to Stand: Min guard         General transfer comment: cues for safe hand placement    Balance Overall balance assessment: Needs assistance   Sitting balance-Leahy Scale: Normal       Standing balance-Leahy Scale: Fair                             ADL either performed or assessed with clinical judgement   ADL Overall ADL's : Needs assistance/impaired Eating/Feeding: Independent;Sitting   Grooming:  Supervision/safety;Standing;Oral care Grooming Details (indicate cue type and reason): standing at sink to brush teeth, not needing UE support Upper Body Bathing: Set up;Sitting   Lower Body Bathing: Min guard;Sitting/lateral leans;Sit to/from stand   Upper Body Dressing : Set up;Sitting   Lower Body Dressing: Minimal assistance;Sit to/from stand;Sitting/lateral leans Lower Body Dressing Details (indicate cue type and reason): to don tennis shoes, pt states this is baseline Toilet Transfer: Min guard;Ambulation;RW   Toileting- Clothing Manipulation and Hygiene: Set up;Sitting/lateral lean;Sit to/from stand   Tub/ Shower Transfer: Min guard;Ambulation;Shower seat;Rolling walker   Functional mobility during ADLs: Min guard;Rolling walker       Vision Baseline Vision/History: Wears glasses Wears Glasses: At all times Patient Visual Report: No change from baseline       Perception     Praxis      Pertinent Vitals/Pain Pain Assessment: No/denies pain     Hand Dominance     Extremity/Trunk Assessment Upper Extremity Assessment Upper Extremity Assessment: Overall WFL for tasks assessed   Lower Extremity Assessment Lower Extremity Assessment: Defer to PT evaluation       Communication Communication Communication: No difficulties   Cognition Arousal/Alertness: Awake/alert Behavior During Therapy: WFL for tasks assessed/performed Overall Cognitive Status: Within Functional Limits for tasks assessed  General Comments       Exercises     Shoulder Instructions      Home Living Family/patient expects to be discharged to:: Private residence Living Arrangements: Spouse/significant other Available Help at Discharge: Family;Available 24 hours/day Type of Home: Independent living facility([facility]) Home Access: Level entry     Home Layout: One level     Bathroom Shower/Tub: Occupational psychologist:  Handicapped height Bathroom Accessibility: Yes How Accessible: Accessible via walker Home Equipment: Grab bars - tub/shower;Grab bars - toilet;Walker - 2 wheels;Walker - 4 wheels;Wheelchair - manual;Hand held shower head;Shower seat - built in   Additional Comments: lives at [facility] with his wife      Prior Functioning/Environment Level of Independence: Independent with assistive device(s)        Comments: Uses rollator - can walk community distances if he has to but typically does not; independent with ADLs- IADLs partially managed by facility        OT Problem List: Decreased strength;Decreased knowledge of use of DME or AE;Decreased activity tolerance;Impaired balance (sitting and/or standing)      OT Treatment/Interventions: Self-care/ADL training;Therapeutic exercise;Patient/family education;Balance training;Energy conservation;Therapeutic activities;DME and/or AE instruction    OT Goals(Current goals can be found in the care plan section) Acute Rehab OT Goals Patient Stated Goal: return home soon OT Goal Formulation: With patient Time For Goal Achievement: 07/05/19 Potential to Achieve Goals: Good  OT Frequency: Min 2X/week   Barriers to D/C:            Co-evaluation              AM-PAC OT "6 Clicks" Daily Activity     Outcome Measure Help from another person eating meals?: None Help from another person taking care of personal grooming?: None Help from another person toileting, which includes using toliet, bedpan, or urinal?: A Little Help from another person bathing (including washing, rinsing, drying)?: A Little Help from another person to put on and taking off regular upper body clothing?: None Help from another person to put on and taking off regular lower body clothing?: A Little 6 Click Score: 21   End of Session Equipment Utilized During Treatment: Gait belt;Rolling walker Nurse Communication: Mobility status  Activity Tolerance: Patient  tolerated treatment well Patient left: in chair;with call bell/phone within reach  OT Visit Diagnosis: Unsteadiness on feet (R26.81);Other abnormalities of gait and mobility (R26.89);Muscle weakness (generalized) (M62.81)                Time: 1000-1016 OT Time Calculation (min): 16 min Charges:  OT General Charges $OT Visit: 1 Visit OT Evaluation $OT Eval Moderate Complexity: [place], MSOT, OTR/L [facility] Office Number: [phone] Pager: [phone]  Zenovia Jarred 06/21/2019, 1:12 PM

## 2019-06-21 NOTE — Plan of Care (Signed)
  Problem: Education: Goal: Knowledge of General Education information will improve Description: Including pain rating scale, medication(s)/side effects and non-pharmacologic comfort measures Outcome: Progressing   Problem: Health Behavior/Discharge Planning: Goal: Ability to manage health-related needs will improve Outcome: Progressing   Problem: Clinical Measurements: Goal: Ability to maintain clinical measurements within normal limits will improve Outcome: Progressing Goal: Will remain free from infection Outcome: Progressing Goal: Diagnostic test results will improve Outcome: Progressing Goal: Respiratory complications will improve Outcome: Progressing Goal: Cardiovascular complication will be avoided Outcome: Progressing   Problem: Activity: Goal: Risk for activity intolerance will decrease Outcome: Progressing   Problem: Nutrition: Goal: Adequate nutrition will be maintained Outcome: Progressing   Problem: Coping: Goal: Level of anxiety will decrease Outcome: Progressing   Problem: Elimination: Goal: Will not experience complications related to urinary retention Outcome: Progressing   Problem: Pain Managment: Goal: General experience of comfort will improve Outcome: Progressing   Problem: Safety: Goal: Ability to remain free from injury will improve Outcome: Progressing   Problem: Skin Integrity: Goal: Risk for impaired skin integrity will decrease Outcome: Progressing   Problem: Education: Goal: Ability to identify signs and symptoms of gastrointestinal bleeding will improve Outcome: Progressing   Problem: Bowel/Gastric: Goal: Will show no signs and symptoms of gastrointestinal bleeding Outcome: Progressing   Problem: Fluid Volume: Goal: Will show no signs and symptoms of excessive bleeding Outcome: Progressing   Problem: Clinical Measurements: Goal: Complications related to the disease process, condition or treatment will be avoided or  minimized Outcome: Progressing

## 2019-06-21 NOTE — Progress Notes (Signed)
[facility] PROGRESS NOTE    Progress Note  BB ROZEBOOM  [id number] DOB: 1931-07-16 DOA: 06/19/2019 PCP: Seward Carol, MD     Brief Narrative:   [name] is an 86 y.o. male past medical history of asthma, hyperlipidemia diabetes mellitus type 2, chronic systolic heart failure with an EF of 45% who presents to the ED with hematochezia and admitted for GI bleed.  Assessment/Plan:   Recurrent GI bleed: History of known diverticulosis which is presumed as the cause.  On his previous admission and 05/10/2019 he underwent a full work-up with nuc med scan, CT angio, angiogram, colonoscopy and upper endoscopy, where the bleeding scan suggest possibly right colonic source. The GI physician on his previous admission had a long discussion with the patient and family and they would not want any more aggressive testing besides lab test and blood transfusion. His hemoglobin continues to trickle down, on admission it was 12 this morning is 9.9. He is status post 1 unit of packed red blood cells. His hemoglobin continues to drift down, question of its equilibrating, will monitor 1 additional day check a CBC tomorrow.  History of stroke: Continue statins, holding Plavix until bleeding subsides.  Chronic systolic and diastolic heart failure: Appears clinically euvolemic.  Diabetes mellitus type 2: Fairly controlled generally on no insulins.  History of restless leg syndrome: Continue Requip.  DVT prophylaxis: Scd Family Communication:wife Status is: Inpatient  Remains inpatient appropriate because:Ongoing GI bleed.   Dispo: The patient is from: Home              Anticipated d/c is to: Home              Anticipated d/c date is: 1 day              Patient currently is not medically stable to d/c.         Code Status:     Code Status Orders  (From admission, onward)         Start     Ordered   06/19/19 2220  Full code  Continuous     06/19/19 2219         Code Status History    Date Active Date Inactive Code Status Order ID Comments User Context   04/01/2019 0051 04/05/2019 2356 DNR [id number]  Jani Gravel, MD ED   08/21/2018 2042 08/22/2018 1858 DNR [id number]  Elwyn Reach, MD Inpatient   04/24/2018 0020 04/24/2018 1717 DNR [id number]  Etta Quill, DO ED   04/21/2018 2339 04/23/2018 1818 DNR [id number]  Reubin Milan, MD ED   02/04/2018 2207 02/07/2018 1529 DNR [id number]  Vianne Bulls, MD Inpatient   02/04/2018 2206 02/04/2018 2207 Full Code [id number]  Vianne Bulls, MD Inpatient   12/23/2017 2353 12/26/2017 1756 DNR [id number]  Vianne Bulls, MD ED   12/11/2016 1615 12/13/2016 2205 DNR [id number]  Roxan Hockey, MD ED   10/24/2016 1509 10/25/2016 2219 DNR [id number]  Truett Mainland, DO Inpatient   10/24/2016 1025 10/24/2016 1509 DNR [id number]  Fredia Sorrow, MD ED   02/22/2016 1525 02/27/2016 1817 DNR [id number]  Samella Parr, NP Inpatient   01/30/2016 2118 02/01/2016 1459 Full Code [id number]  Karmen Bongo, MD Inpatient   07/25/2015 1708 07/26/2015 1807 Full Code [id number]  Waldemar Dickens, MD ED   12/22/2013 1430 12/23/2013 2016 Full Code [id number]  Modena Jansky, MD Inpatient   01/29/2012 1139 02/04/2012 1300 Full Code [id number]  Haskins-Scott, Ellison Hughs  C, RN Inpatient   Advance Care Planning Activity        IV Access:    Peripheral IV   Procedures and diagnostic studies:   No results found.   Medical Consultants:    None.  Anti-Infectives:   none  Subjective:    [name] patient relates his weakness is improved, he relates his last bloody bowel movement was yesterday.  Objective:    Vitals:   06/20/19 1933 06/21/19 0036 06/21/19 0455 06/21/19 0722  BP: (!) 145/72 110/78 132/86 137/73  Pulse: 75 94 91 75  Resp: 18 18 18 20   Temp: 97.8 F (36.6 C) (!) 97.4 F (36.3 C) 98.4 F (36.9 C) 98.3 F (36.8 C)  TempSrc: Oral Oral Oral Oral  SpO2: 98% 97% 94% 95%  Weight:   65.8 kg   Height:        SpO2: 95 %   Intake/Output Summary (Last 24 hours) at 06/21/2019 0817 Last data filed at 06/21/2019 0813 Gross per 24 hour  Intake 840 ml  Output 2050 ml  Net -1210 ml   Filed Weights   06/20/19 1250 06/20/19 1300 06/21/19 0455  Weight: 66.8 kg 66.2 kg 65.8 kg    Exam: General exam: In no acute distress. Respiratory system: Good air movement and clear to auscultation. Cardiovascular system: S1 & S2 heard, RRR. No JVD. Gastrointestinal system: Abdomen is nondistended, soft and nontender.  Central nervous system: Alert and oriented. No focal neurological deficits. Extremities: No pedal edema. Skin: No rashes, lesions or ulcers .Marland Kitchen  Data Reviewed:    Labs: Basic Metabolic Panel: Recent Labs  Lab 06/19/19 1836 06/21/19 0552  NA 135 137  K 4.4 3.6  CL 102 104  CO2 25 27  GLUCOSE 154* 127*  BUN 12 12  CREATININE 0.78 0.79  CALCIUM 8.5* 8.4*   GFR Estimated Creatinine Clearance: 62.8 mL/min (by C-G formula based on SCr of 0.79 mg/dL). Liver Function Tests: Recent Labs  Lab 06/19/19 1836  AST 17  ALT 11  ALKPHOS 52  BILITOT 0.5  PROT 6.2*  ALBUMIN 3.5   No results for input(s): LIPASE, AMYLASE in the last 168 hours. No results for input(s): AMMONIA in the last 168 hours. Coagulation profile No results for input(s): INR, PROTIME in the last 168 hours. COVID-19 Labs  No results for input(s): DDIMER, FERRITIN, LDH, CRP in the last 72 hours.  Lab Results  Component Value Date   SARSCOV2NAA NEGATIVE 06/19/2019   [place] NEGATIVE 04/01/2019   [place] NEGATIVE 08/21/2018    CBC: Recent Labs  Lab 06/19/19 1836 06/19/19 1836 06/19/19 2110 06/20/19 0310 06/20/19 0933 06/20/19 2037 06/21/19 0552  WBC 10.4   < > 13.8* 11.5* 10.0 9.8 9.6  NEUTROABS 6.1  --   --   --   --   --   --   HGB 12.3*   < > 12.1* 10.5* 10.3* 10.3* 9.9*  HCT 40.6   < > 39.5 33.9* 33.0* 32.8* 31.1*  MCV 92.1   < > 92.7 91.6 90.7 91.4 90.7  PLT 380   < > 323 289 293 298 287    < > = values in this interval not displayed.   Cardiac Enzymes: No results for input(s): CKTOTAL, CKMB, CKMBINDEX, TROPONINI in the last 168 hours. BNP (last 3 results) No results for input(s): PROBNP in the last 8760 hours. CBG: No results for input(s): GLUCAP in the last 168 hours. D-Dimer: No results for input(s): DDIMER in the last 72  hours. Hgb A1c: No results for input(s): HGBA1C in the last 72 hours. Lipid Profile: No results for input(s): CHOL, HDL, LDLCALC, TRIG, CHOLHDL, LDLDIRECT in the last 72 hours. Thyroid function studies: No results for input(s): TSH, T4TOTAL, T3FREE, THYROIDAB in the last 72 hours.  Invalid input(s): FREET3 Anemia work up: No results for input(s): VITAMINB12, FOLATE, FERRITIN, TIBC, IRON, RETICCTPCT in the last 72 hours. Sepsis Labs: Recent Labs  Lab 06/19/19 1836 06/19/19 2110 06/20/19 0310 06/20/19 0933 06/20/19 2037 06/21/19 0552  WBC 10.4   < > 11.5* 10.0 9.8 9.6  LATICACIDVEN 1.9  --   --   --   --   --    < > = values in this interval not displayed.   Microbiology Recent Results (from the past 240 hour(s))  Respiratory Panel by RT PCR (Flu A&B, Covid) - Nasopharyngeal Swab     Status: None   Collection Time: 06/19/19  8:11 PM   Specimen: Nasopharyngeal Swab  Result Value Ref Range Status   SARS Coronavirus 2 by RT PCR NEGATIVE NEGATIVE Final    Comment: (NOTE) SARS-CoV-2 target nucleic acids are NOT DETECTED. The SARS-CoV-2 RNA is generally detectable in upper respiratoy specimens during the acute phase of infection. The lowest concentration of SARS-CoV-2 viral copies this assay can detect is 131 copies/mL. A negative result does not preclude SARS-Cov-2 infection and should not be used as the sole basis for treatment or other patient management decisions. A negative result may occur with  improper specimen collection/handling, submission of specimen other than nasopharyngeal swab, presence of viral mutation(s) within the areas  targeted by this assay, and inadequate number of viral copies (<131 copies/mL). A negative result must be combined with clinical observations, patient history, and epidemiological information. The expected result is Negative. Fact Sheet for Patients:  PinkCheek.be Fact Sheet for Healthcare Providers:  GravelBags.it This test is not yet ap proved or cleared by the [place] FDA and  has been authorized for detection and/or diagnosis of SARS-CoV-2 by FDA under an Emergency Use Authorization (EUA). This EUA will remain  in effect (meaning this test can be used) for the duration of the COVID-19 declaration under Section 564(b)(1) of the Act, 21 U.S.C. section 360bbb-3(b)(1), unless the authorization is terminated or revoked sooner.    Influenza A by PCR NEGATIVE NEGATIVE Final   Influenza B by PCR NEGATIVE NEGATIVE Final    Comment: (NOTE) The Xpert Xpress SARS-CoV-2/FLU/RSV assay is intended as an aid in  the diagnosis of influenza from Nasopharyngeal swab specimens and  should not be used as a sole basis for treatment. Nasal washings and  aspirates are unacceptable for Xpert Xpress SARS-CoV-2/FLU/RSV  testing. Fact Sheet for Patients: PinkCheek.be Fact Sheet for Healthcare Providers: GravelBags.it This test is not yet approved or cleared by the [place] FDA and  has been authorized for detection and/or diagnosis of SARS-CoV-2 by  FDA under an Emergency Use Authorization (EUA). This EUA will remain  in effect (meaning this test can be used) for the duration of the  Covid-19 declaration under Section 564(b)(1) of the Act, 21  U.S.C. section 360bbb-3(b)(1), unless the authorization is  terminated or revoked. Performed at [facility], [place] [address] [place], [place] [address]      Medications:   . budesonide  2 mL Nebulization BID  . carvedilol   3.125 mg Oral BID WC  . pantoprazole (PROTONIX) IV  40 mg Intravenous Q12H  . rOPINIRole  2 mg Oral QHS  .  simvastatin  40 mg Oral QHS   Continuous Infusions: . sodium chloride Stopped (06/19/19 2001)      LOS: 2 days   Charlynne Cousins  [facility]  06/21/2019, 8:17 AM

## 2019-06-22 DIAGNOSIS — K5791 Diverticulosis of intestine, part unspecified, without perforation or abscess with bleeding: Secondary | ICD-10-CM

## 2019-06-22 LAB — CBC
HCT: 29.3 % — ABNORMAL LOW (ref 39.0–52.0)
Hemoglobin: 9.1 g/dL — ABNORMAL LOW (ref 13.0–17.0)
MCH: 28.3 pg (ref 26.0–34.0)
MCHC: 31.1 g/dL (ref 30.0–36.0)
MCV: 91 fL (ref 80.0–100.0)
Platelets: 295 10*3/uL (ref 150–400)
RBC: 3.22 MIL/uL — ABNORMAL LOW (ref 4.22–5.81)
RDW: 16.7 % — ABNORMAL HIGH (ref 11.5–15.5)
WBC: 7.6 10*3/uL (ref 4.0–10.5)
nRBC: 0 % (ref 0.0–0.2)

## 2019-06-22 MED ORDER — METFORMIN HCL 1000 MG PO TABS: 1000.0000 mg | ORAL_TABLET | Freq: Every day | ORAL | Status: DC

## 2019-06-22 MED ORDER — ACETAMINOPHEN 325 MG PO TABS
650.0000 mg | ORAL_TABLET | Freq: Four times a day (QID) | ORAL | Status: DC | PRN
  Administered 2019-06-22: 650 mg via ORAL
  Filled 2019-06-22: qty 2

## 2019-06-22 MED ORDER — POLYETHYLENE GLYCOL 3350 17 G PO PACK
17.0000 g | PACK | Freq: Two times a day (BID) | ORAL | Status: DC
  Administered 2019-06-22: 17 g via ORAL
  Filled 2019-06-22: qty 1

## 2019-06-22 MED ORDER — CLOPIDOGREL BISULFATE 75 MG PO TABS: 75.0000 mg | ORAL_TABLET | Freq: Every day | ORAL | Status: DC

## 2019-06-22 NOTE — Discharge Summary (Addendum)
Physician Discharge Summary  [name] [id number] DOB: 10/07/1931 DOA: 06/19/2019  PCP: Seward Carol, MD  Admit date: 06/19/2019 Discharge date: 06/22/2019  Admitted From: Home Disposition:  Home  Recommendations for Outpatient Follow-up:  1. Follow up with PCP in 1-2 weeks 2. Please obtain BMP/CBC in one week   Home Health:No Equipment/Devices:None  Discharge Condition:Stable CODE STATUS:Full Diet recommendation: Heart Healthy  Brief/Interim Summary: 85 y.o. male past medical history of asthma, hyperlipidemia diabetes mellitus type 2, chronic systolic heart failure with an EF of 45% who presents to the ED with hematochezia and admitted for GI bleed  Discharge Diagnoses:  Principal Problem:   GI bleed Active Problems:   History of esophageal strciture   Diverticulosis of colon with hemorrhage   Diabetes mellitus type II, non insulin dependent (HCC)   HLD (hyperlipidemia)   Chronic anticoagulation 2/2 history of CVA   Acute lower GI bleeding   Benign essential HTN   Chronic combined systolic and diastolic CHF (congestive heart failure) (HCC)   Diverticulosis large intestine w/o perforation or abscess w/bleeding  Recurrent GI bleed: With a history of known diverticulosis which is presumed as the cause of his GI bleed on previous admission on 05/10/2019 he was underwent a full work-up with a nuc med CT angiogram colonoscopy and upper endoscopy with a bleeding scan suggesting a possible right colonic source. The previous physician had a long discussion with him on the previous admission and him and the family do not want any more aggressive testing besides lab and blood transfusion if this recurred. He was transfused 1 unit of packed red blood cells his hemoglobin remained relatively stable he will can go home on ferrous sulfate he will continue to hold his Plavix for 3 more days he had no  episodes of bleeding in the hospital.  History of stroke: Continue statin he will  restart Plavix as an outpatient.  Chronic systolic heart failure, Appears euvolemic no change made to his medication.  Diabetes mellitus type 2: No changes made to his medication.  History of restless leg syndrome: Continue Requip as an outpatient.  Discharge Instructions  Discharge Instructions    Diet - low sodium heart healthy   Complete by: As directed    Increase activity slowly   Complete by: As directed      Allergies as of 06/22/2019      Reactions   Tape Other (See Comments)   SKIN IS SENSITIVE; PLEASE USE PAPER TAPE; SKIN BRUISES AND TEARS EASILY!!   Azithromycin Rash, Other (See Comments)   Pt had a Z-Pak Jan, 2020 - no reaction(??)      Medication List    TAKE these medications   ProAir HFA 108 (90 Base) MCG/ACT inhaler Generic drug: albuterol Inhale 2 puffs into the lungs every 6 (six) hours as needed for wheezing or shortness of breath.   albuterol (2.5 MG/3ML) 0.083% nebulizer solution Commonly known as: PROVENTIL Take 3 mLs by nebulization every 6 (six) hours as needed for wheezing or shortness of breath.   budesonide 0.5 MG/2ML nebulizer solution Commonly known as: PULMICORT Take 2 mLs by nebulization 2 (two) times daily.   carvedilol 3.125 MG tablet Commonly known as: COREG Take 1 tablet (3.125 mg total) by mouth 2 (two) times daily with a meal.   clopidogrel 75 MG tablet Commonly known as: PLAVIX Take 1 tablet (75 mg total) by mouth daily. Start taking on: Jun 25, 2019 What changed: These instructions start on Jun 25, 2019. If you are  unsure what to do until then, ask your doctor or other care provider.   diphenhydramine-acetaminophen 25-500 MG Tabs tablet Commonly known as: TYLENOL PM Take 2 tablets by mouth at bedtime.   ferrous sulfate 325 (65 FE) MG tablet Take 1 tablet (325 mg total) by mouth daily.   fluticasone 50 MCG/ACT nasal spray Commonly known as: FLONASE Place 1 spray into both nostrils daily as needed for allergies or  rhinitis.   Gas-X Prevention Caps Take 1 capsule by mouth 2 (two) times daily with a meal.   Melatonin 10 MG Tabs Take 10 mg by mouth at bedtime as needed (for sleep).   metFORMIN 1000 MG tablet Commonly known as: GLUCOPHAGE Take 1 tablet (1,000 mg total) by mouth daily after breakfast.   montelukast 10 MG tablet Commonly known as: SINGULAIR Take 10 mg by mouth at bedtime as needed (for breathing flares).   pantoprazole 40 MG tablet Commonly known as: PROTONIX Take 1 tablet (40 mg total) by mouth 2 (two) times daily.   polyethylene glycol 17 g packet Commonly known as: MIRALAX / GLYCOLAX Take 17 g by mouth daily as needed for mild constipation (MIX AND DRINK).   psyllium 58.6 % powder Commonly known as: METAMUCIL Take 1 packet by mouth daily as needed (for constipation- MIX AND DRINK).   rOPINIRole 1 MG tablet Commonly known as: REQUIP Take 2 mg by mouth at bedtime.   simvastatin 40 MG tablet Commonly known as: ZOCOR Take 1 tablet (40 mg total) by mouth daily. What changed: when to take this      Follow-up Information    Seward Carol, MD. Go on 06/29/2019.   Specialty: Internal Medicine Why: @10 :30am Contact information: [address] [place] [address] [phone]          Allergies  Allergen Reactions  . Tape Other (See Comments)    SKIN IS SENSITIVE; PLEASE USE PAPER TAPE; SKIN BRUISES AND TEARS EASILY!!  . Azithromycin Rash and Other (See Comments)    Pt had a Z-Pak Jan, 2020 - no reaction(??)    Consultations:  None   Procedures/Studies: No results found.  Subjective: No complaints feels great.  Discharge Exam: Vitals:   06/22/19 0819 06/22/19 0825  BP:  133/69  Pulse:  71  Resp:    Temp:    SpO2: 98%    Vitals:   06/21/19 1944 06/22/19 0551 06/22/19 0819 06/22/19 0825  BP: (!) 153/74 121/68  133/69  Pulse: 74 80  71  Resp: 18 18    Temp: 97.7 F (36.5 C) 97.7 F (36.5 C)    TempSrc: Oral Oral    SpO2:  98% 95% 98%   Weight:  65.3 kg    Height:        General: Pt is alert, awake, not in acute distress Cardiovascular: RRR, S1/S2 +, no rubs, no gallops Respiratory: CTA bilaterally, no wheezing, no rhonchi Abdominal: Soft, NT, ND, bowel sounds + Extremities: no edema, no cyanosis    The results of significant diagnostics from this hospitalization (including imaging, microbiology, ancillary and laboratory) are listed below for reference.     Microbiology: Recent Results (from the past 240 hour(s))  Respiratory Panel by RT PCR (Flu A&B, Covid) - Nasopharyngeal Swab     Status: None   Collection Time: 06/19/19  8:11 PM   Specimen: Nasopharyngeal Swab  Result Value Ref Range Status   SARS Coronavirus 2 by RT PCR NEGATIVE NEGATIVE Final    Comment: (NOTE) SARS-CoV-2  target nucleic acids are NOT DETECTED. The SARS-CoV-2 RNA is generally detectable in upper respiratoy specimens during the acute phase of infection. The lowest concentration of SARS-CoV-2 viral copies this assay can detect is 131 copies/mL. A negative result does not preclude SARS-Cov-2 infection and should not be used as the sole basis for treatment or other patient management decisions. A negative result may occur with  improper specimen collection/handling, submission of specimen other than nasopharyngeal swab, presence of viral mutation(s) within the areas targeted by this assay, and inadequate number of viral copies (<131 copies/mL). A negative result must be combined with clinical observations, patient history, and epidemiological information. The expected result is Negative. Fact Sheet for Patients:  PinkCheek.be Fact Sheet for Healthcare Providers:  GravelBags.it This test is not yet ap proved or cleared by the [place] FDA and  has been authorized for detection and/or diagnosis of SARS-CoV-2 by FDA under an Emergency Use Authorization (EUA). This  EUA will remain  in effect (meaning this test can be used) for the duration of the COVID-19 declaration under Section 564(b)(1) of the Act, 21 U.S.C. section 360bbb-3(b)(1), unless the authorization is terminated or revoked sooner.    Influenza A by PCR NEGATIVE NEGATIVE Final   Influenza B by PCR NEGATIVE NEGATIVE Final    Comment: (NOTE) The Xpert Xpress SARS-CoV-2/FLU/RSV assay is intended as an aid in  the diagnosis of influenza from Nasopharyngeal swab specimens and  should not be used as a sole basis for treatment. Nasal washings and  aspirates are unacceptable for Xpert Xpress SARS-CoV-2/FLU/RSV  testing. Fact Sheet for Patients: PinkCheek.be Fact Sheet for Healthcare Providers: GravelBags.it This test is not yet approved or cleared by the [place] FDA and  has been authorized for detection and/or diagnosis of SARS-CoV-2 by  FDA under an Emergency Use Authorization (EUA). This EUA will remain  in effect (meaning this test can be used) for the duration of the  Covid-19 declaration under Section 564(b)(1) of the Act, 21  U.S.C. section 360bbb-3(b)(1), unless the authorization is  terminated or revoked. Performed at [facility], [place] [address], [place], [place] [address]      Labs: BNP (last 3 results) No results for input(s): BNP in the last 8760 hours. Basic Metabolic Panel: Recent Labs  Lab 06/19/19 1836 06/21/19 0552  NA 135 137  K 4.4 3.6  CL 102 104  CO2 25 27  GLUCOSE 154* 127*  BUN 12 12  CREATININE 0.78 0.79  CALCIUM 8.5* 8.4*   Liver Function Tests: Recent Labs  Lab 06/19/19 1836  AST 17  ALT 11  ALKPHOS 52  BILITOT 0.5  PROT 6.2*  ALBUMIN 3.5   No results for input(s): LIPASE, AMYLASE in the last 168 hours. No results for input(s): AMMONIA in the last 168 hours. CBC: Recent Labs  Lab 06/19/19 1836 06/19/19 2110 06/20/19 0310 06/20/19 0933 06/20/19 2037 06/21/19 0552  06/22/19 0504  WBC 10.4   < > 11.5* 10.0 9.8 9.6 7.6  NEUTROABS 6.1  --   --   --   --   --   --   HGB 12.3*   < > 10.5* 10.3* 10.3* 9.9* 9.1*  HCT 40.6   < > 33.9* 33.0* 32.8* 31.1* 29.3*  MCV 92.1   < > 91.6 90.7 91.4 90.7 91.0  PLT 380   < > 289 293 298 287 295   < > = values in this interval not displayed.   Cardiac Enzymes: No results for  input(s): CKTOTAL, CKMB, CKMBINDEX, TROPONINI in the last 168 hours. BNP: Invalid input(s): POCBNP CBG: No results for input(s): GLUCAP in the last 168 hours. D-Dimer No results for input(s): DDIMER in the last 72 hours. Hgb A1c No results for input(s): HGBA1C in the last 72 hours. Lipid Profile No results for input(s): CHOL, HDL, LDLCALC, TRIG, CHOLHDL, LDLDIRECT in the last 72 hours. Thyroid function studies No results for input(s): TSH, T4TOTAL, T3FREE, THYROIDAB in the last 72 hours.  Invalid input(s): FREET3 Anemia work up No results for input(s): VITAMINB12, FOLATE, FERRITIN, TIBC, IRON, RETICCTPCT in the last 72 hours. Urinalysis    Component Value Date/Time   COLORURINE STRAW (A) 04/21/2018 2320   APPEARANCEUR CLEAR 04/21/2018 2320   LABSPEC 1.013 04/21/2018 2320   PHURINE 6.0 04/21/2018 2320   GLUCOSEU NEGATIVE 04/21/2018 2320   HGBUR NEGATIVE 04/21/2018 2320   BILIRUBINUR NEGATIVE 04/21/2018 2320   KETONESUR NEGATIVE 04/21/2018 2320   PROTEINUR NEGATIVE 04/21/2018 2320   UROBILINOGEN 0.2 12/22/2013 1210   NITRITE NEGATIVE 04/21/2018 2320   LEUKOCYTESUR NEGATIVE 04/21/2018 2320   Sepsis Labs Invalid input(s): PROCALCITONIN,  WBC,  LACTICIDVEN Microbiology Recent Results (from the past 240 hour(s))  Respiratory Panel by RT PCR (Flu A&B, Covid) - Nasopharyngeal Swab     Status: None   Collection Time: 06/19/19  8:11 PM   Specimen: Nasopharyngeal Swab  Result Value Ref Range Status   SARS Coronavirus 2 by RT PCR NEGATIVE NEGATIVE Final    Comment: (NOTE) SARS-CoV-2 target nucleic acids are NOT DETECTED. The  SARS-CoV-2 RNA is generally detectable in upper respiratoy specimens during the acute phase of infection. The lowest concentration of SARS-CoV-2 viral copies this assay can detect is 131 copies/mL. A negative result does not preclude SARS-Cov-2 infection and should not be used as the sole basis for treatment or other patient management decisions. A negative result may occur with  improper specimen collection/handling, submission of specimen other than nasopharyngeal swab, presence of viral mutation(s) within the areas targeted by this assay, and inadequate number of viral copies (<131 copies/mL). A negative result must be combined with clinical observations, patient history, and epidemiological information. The expected result is Negative. Fact Sheet for Patients:  PinkCheek.be Fact Sheet for Healthcare Providers:  GravelBags.it This test is not yet ap proved or cleared by the [place] FDA and  has been authorized for detection and/or diagnosis of SARS-CoV-2 by FDA under an Emergency Use Authorization (EUA). This EUA will remain  in effect (meaning this test can be used) for the duration of the COVID-19 declaration under Section 564(b)(1) of the Act, 21 U.S.C. section 360bbb-3(b)(1), unless the authorization is terminated or revoked sooner.    Influenza A by PCR NEGATIVE NEGATIVE Final   Influenza B by PCR NEGATIVE NEGATIVE Final    Comment: (NOTE) The Xpert Xpress SARS-CoV-2/FLU/RSV assay is intended as an aid in  the diagnosis of influenza from Nasopharyngeal swab specimens and  should not be used as a sole basis for treatment. Nasal washings and  aspirates are unacceptable for Xpert Xpress SARS-CoV-2/FLU/RSV  testing. Fact Sheet for Patients: PinkCheek.be Fact Sheet for Healthcare Providers: GravelBags.it This test is not yet approved or cleared by the [place] FDA and  has been authorized for detection and/or diagnosis of SARS-CoV-2 by  FDA under an Emergency Use Authorization (EUA). This EUA will remain  in effect (meaning this test can be used) for the duration of the  Covid-19 declaration under Section 564(b)(1) of the Act, 21  U.S.C.  section 360bbb-3(b)(1), unless the authorization is  terminated or revoked. Performed at [facility], [place] [address], [place], [place] [address]      Time coordinating discharge: Over 40 minutes  SIGNED:   Charlynne Cousins, MD  [facility] 06/22/2019, 11:37 AM Pager   If 7PM-7AM, please contact night-coverage [email or web address] Password [email or web address]

## 2019-06-23 LAB — TYPE AND SCREEN
ABO/RH(D): O POS
Antibody Screen: NEGATIVE
Unit division: 0
Unit division: 0
Unit division: 0

## 2019-06-23 LAB — BPAM RBC
Blood Product Expiration Date: 202105312359
Blood Product Expiration Date: 202106022359
Blood Product Expiration Date: 202106022359
ISSUE DATE / TIME: 202105031924
ISSUE DATE / TIME: 202105070257
ISSUE DATE / TIME: 202105070553
Unit Type and Rh: 5100
Unit Type and Rh: 5100
Unit Type and Rh: 5100

## 2019-08-24 ENCOUNTER — Ambulatory Visit: Payer: Medicare HMO | Admitting: Adult Health

## 2019-10-10 ENCOUNTER — Telehealth: Payer: Self-pay | Admitting: Gastroenterology

## 2019-10-10 ENCOUNTER — Inpatient Hospital Stay (HOSPITAL_COMMUNITY)
Admission: EM | Admit: 2019-10-10 | Discharge: 2019-10-15 | DRG: 378 | Disposition: A | Discharge status: Home / Self Care | Payer: Medicare Other | Attending: Internal Medicine | Admitting: Internal Medicine

## 2019-10-10 ENCOUNTER — Other Ambulatory Visit: Payer: Self-pay

## 2019-10-10 ENCOUNTER — Encounter (HOSPITAL_COMMUNITY): Payer: Self-pay

## 2019-10-10 DIAGNOSIS — I5042 Chronic combined systolic (congestive) and diastolic (congestive) heart failure: Secondary | ICD-10-CM | POA: Diagnosis present

## 2019-10-10 DIAGNOSIS — Z7901 Long term (current) use of anticoagulants: Secondary | ICD-10-CM

## 2019-10-10 DIAGNOSIS — Z9081 Acquired absence of spleen: Secondary | ICD-10-CM

## 2019-10-10 DIAGNOSIS — I69354 Hemiplegia and hemiparesis following cerebral infarction affecting left non-dominant side: Secondary | ICD-10-CM

## 2019-10-10 DIAGNOSIS — Z91048 Other nonmedicinal substance allergy status: Secondary | ICD-10-CM

## 2019-10-10 DIAGNOSIS — R61 Generalized hyperhidrosis: Secondary | ICD-10-CM | POA: Diagnosis present

## 2019-10-10 DIAGNOSIS — Z20822 Contact with and (suspected) exposure to covid-19: Secondary | ICD-10-CM | POA: Diagnosis present

## 2019-10-10 DIAGNOSIS — Z7951 Long term (current) use of inhaled steroids: Secondary | ICD-10-CM

## 2019-10-10 DIAGNOSIS — K5791 Diverticulosis of intestine, part unspecified, without perforation or abscess with bleeding: Secondary | ICD-10-CM | POA: Diagnosis not present

## 2019-10-10 DIAGNOSIS — K922 Gastrointestinal hemorrhage, unspecified: Secondary | ICD-10-CM | POA: Diagnosis present

## 2019-10-10 DIAGNOSIS — I480 Paroxysmal atrial fibrillation: Secondary | ICD-10-CM | POA: Diagnosis present

## 2019-10-10 DIAGNOSIS — R42 Dizziness and giddiness: Secondary | ICD-10-CM | POA: Diagnosis not present

## 2019-10-10 DIAGNOSIS — I959 Hypotension, unspecified: Secondary | ICD-10-CM | POA: Diagnosis present

## 2019-10-10 DIAGNOSIS — Z820 Family history of epilepsy and other diseases of the nervous system: Secondary | ICD-10-CM

## 2019-10-10 DIAGNOSIS — Z7984 Long term (current) use of oral hypoglycemic drugs: Secondary | ICD-10-CM

## 2019-10-10 DIAGNOSIS — G4700 Insomnia, unspecified: Secondary | ICD-10-CM | POA: Diagnosis present

## 2019-10-10 DIAGNOSIS — E785 Hyperlipidemia, unspecified: Secondary | ICD-10-CM | POA: Diagnosis present

## 2019-10-10 DIAGNOSIS — K5731 Diverticulosis of large intestine without perforation or abscess with bleeding: Principal | ICD-10-CM | POA: Diagnosis present

## 2019-10-10 DIAGNOSIS — E119 Type 2 diabetes mellitus without complications: Secondary | ICD-10-CM

## 2019-10-10 DIAGNOSIS — I110 Hypertensive heart disease with heart failure: Secondary | ICD-10-CM | POA: Diagnosis present

## 2019-10-10 DIAGNOSIS — D62 Acute posthemorrhagic anemia: Secondary | ICD-10-CM | POA: Diagnosis present

## 2019-10-10 DIAGNOSIS — K219 Gastro-esophageal reflux disease without esophagitis: Secondary | ICD-10-CM | POA: Diagnosis present

## 2019-10-10 DIAGNOSIS — Z7902 Long term (current) use of antithrombotics/antiplatelets: Secondary | ICD-10-CM

## 2019-10-10 DIAGNOSIS — R54 Age-related physical debility: Secondary | ICD-10-CM | POA: Diagnosis present

## 2019-10-10 DIAGNOSIS — G2581 Restless legs syndrome: Secondary | ICD-10-CM | POA: Diagnosis present

## 2019-10-10 DIAGNOSIS — R000 Tachycardia, unspecified: Secondary | ICD-10-CM | POA: Diagnosis present

## 2019-10-10 DIAGNOSIS — Z888 Allergy status to other drugs, medicaments and biological substances status: Secondary | ICD-10-CM

## 2019-10-10 DIAGNOSIS — J45909 Unspecified asthma, uncomplicated: Secondary | ICD-10-CM | POA: Diagnosis present

## 2019-10-10 DIAGNOSIS — Z66 Do not resuscitate: Secondary | ICD-10-CM | POA: Diagnosis present

## 2019-10-10 DIAGNOSIS — Z79899 Other long term (current) drug therapy: Secondary | ICD-10-CM

## 2019-10-10 DIAGNOSIS — Z8719 Personal history of other diseases of the digestive system: Secondary | ICD-10-CM

## 2019-10-10 LAB — CBC WITH DIFFERENTIAL/PLATELET
Abs Immature Granulocytes: 0.03 10*3/uL (ref 0.00–0.07)
Abs Immature Granulocytes: 0.05 10*3/uL (ref 0.00–0.07)
Basophils Absolute: 0 10*3/uL (ref 0.0–0.1)
Basophils Absolute: 0.1 10*3/uL (ref 0.0–0.1)
Basophils Relative: 0 %
Basophils Relative: 1 %
Eosinophils Absolute: 0.1 10*3/uL (ref 0.0–0.5)
Eosinophils Absolute: 0.1 10*3/uL (ref 0.0–0.5)
Eosinophils Relative: 1 %
Eosinophils Relative: 1 %
HCT: 41.6 % (ref 39.0–52.0)
HCT: 38 % — ABNORMAL LOW (ref 39.0–52.0)
Hemoglobin: 13.4 g/dL (ref 13.0–17.0)
Hemoglobin: 12.1 g/dL — ABNORMAL LOW (ref 13.0–17.0)
Immature Granulocytes: 0 %
Immature Granulocytes: 1 %
Lymphocytes Relative: 15 %
Lymphocytes Relative: 20 %
Lymphs Abs: 1.4 10*3/uL (ref 0.7–4.0)
Lymphs Abs: 2 10*3/uL (ref 0.7–4.0)
MCH: 31.5 pg (ref 26.0–34.0)
MCH: 31.1 pg (ref 26.0–34.0)
MCHC: 32.2 g/dL (ref 30.0–36.0)
MCHC: 31.8 g/dL (ref 30.0–36.0)
MCV: 97.9 fL (ref 80.0–100.0)
MCV: 97.7 fL (ref 80.0–100.0)
Monocytes Absolute: 0.8 10*3/uL (ref 0.1–1.0)
Monocytes Absolute: 0.8 10*3/uL (ref 0.1–1.0)
Monocytes Relative: 8 %
Monocytes Relative: 8 %
Neutro Abs: 7.2 10*3/uL (ref 1.7–7.7)
Neutro Abs: 7 10*3/uL (ref 1.7–7.7)
Neutrophils Relative %: 76 %
Neutrophils Relative %: 69 %
Platelets: 309 10*3/uL (ref 150–400)
Platelets: 315 10*3/uL (ref 150–400)
RBC: 4.25 MIL/uL (ref 4.22–5.81)
RBC: 3.89 MIL/uL — ABNORMAL LOW (ref 4.22–5.81)
RDW: 15.3 % (ref 11.5–15.5)
RDW: 15.3 % (ref 11.5–15.5)
WBC: 9.5 10*3/uL (ref 4.0–10.5)
WBC: 10 10*3/uL (ref 4.0–10.5)
nRBC: 0 % (ref 0.0–0.2)
nRBC: 0 % (ref 0.0–0.2)

## 2019-10-10 LAB — COMPREHENSIVE METABOLIC PANEL
ALT: 10 U/L (ref 0–44)
AST: 13 U/L — ABNORMAL LOW (ref 15–41)
Albumin: 3.1 g/dL — ABNORMAL LOW (ref 3.5–5.0)
Alkaline Phosphatase: 56 U/L (ref 38–126)
Anion gap: 8 (ref 5–15)
BUN: 18 mg/dL (ref 8–23)
CO2: 26 mmol/L (ref 22–32)
Calcium: 8.6 mg/dL — ABNORMAL LOW (ref 8.9–10.3)
Chloride: 101 mmol/L (ref 98–111)
Creatinine, Ser: 0.71 mg/dL (ref 0.61–1.24)
GFR calc Af Amer: >60 mL/min (ref >60)
GFR calc non Af Amer: >60 mL/min (ref >60)
Glucose, Bld: 164 mg/dL — ABNORMAL HIGH (ref 70–99)
Potassium: 4.9 mmol/L (ref 3.5–5.1)
Sodium: 135 mmol/L (ref 135–145)
Total Bilirubin: 0.5 mg/dL (ref 0.3–1.2)
Total Protein: 5.7 g/dL — ABNORMAL LOW (ref 6.5–8.1)

## 2019-10-10 LAB — CBG MONITORING, ED: Glucose-Capillary: 145 mg/dL — ABNORMAL HIGH (ref 70–99)

## 2019-10-10 LAB — HEMOGLOBIN AND HEMATOCRIT, BLOOD
HCT: 37.7 % — ABNORMAL LOW (ref 39.0–52.0)
Hemoglobin: 12 g/dL — ABNORMAL LOW (ref 13.0–17.0)

## 2019-10-10 LAB — LIPASE, BLOOD: Lipase: 20 U/L (ref 11–51)

## 2019-10-10 LAB — PREPARE RBC (CROSSMATCH)

## 2019-10-10 LAB — LACTIC ACID, PLASMA: Lactic Acid, Venous: 1.9 mmol/L (ref 0.5–1.9)

## 2019-10-10 LAB — SARS CORONAVIRUS 2 BY RT PCR (HOSPITAL ORDER, PERFORMED IN ~~LOC~~ HOSPITAL LAB): SARS Coronavirus 2: NEGATIVE

## 2019-10-10 MED ORDER — MONTELUKAST SODIUM 10 MG PO TABS
10.0000 mg | ORAL_TABLET | Freq: Every evening | ORAL | Status: DC | PRN
  Administered 2019-10-13: 10 mg via ORAL
  Filled 2019-10-10 (×2): qty 1

## 2019-10-10 MED ORDER — ALBUTEROL SULFATE (2.5 MG/3ML) 0.083% IN NEBU: 3.0000 mL | INHALATION_SOLUTION | Freq: Four times a day (QID) | RESPIRATORY_TRACT | Status: DC | PRN

## 2019-10-10 MED ORDER — ROPINIROLE HCL 1 MG PO TABS
2.0000 mg | ORAL_TABLET | Freq: Every day | ORAL | Status: DC
  Administered 2019-10-10 – 2019-10-14 (×5): 2 mg via ORAL
  Filled 2019-10-10 (×5): qty 2

## 2019-10-10 MED ORDER — LACTATED RINGERS IV BOLUS
1000.0000 mL | Freq: Once | INTRAVENOUS | Status: AC
  Administered 2019-10-10: 1000 mL via INTRAVENOUS

## 2019-10-10 MED ORDER — FERROUS SULFATE 325 (65 FE) MG PO TABS
325.0000 mg | ORAL_TABLET | Freq: Every day | ORAL | Status: DC
  Administered 2019-10-10 – 2019-10-15 (×6): 325 mg via ORAL
  Filled 2019-10-10 (×6): qty 1

## 2019-10-10 MED ORDER — FLUTICASONE PROPIONATE 50 MCG/ACT NA SUSP
1.0000 | Freq: Every day | NASAL | Status: DC | PRN
  Filled 2019-10-10: qty 16

## 2019-10-10 MED ORDER — BUDESONIDE 0.5 MG/2ML IN SUSP
2.0000 mL | Freq: Two times a day (BID) | RESPIRATORY_TRACT | Status: DC
  Administered 2019-10-10 – 2019-10-15 (×10): 0.5 mg via RESPIRATORY_TRACT
  Filled 2019-10-10 (×10): qty 2

## 2019-10-10 MED ORDER — MELATONIN 5 MG PO TABS
10.0000 mg | ORAL_TABLET | Freq: Every evening | ORAL | Status: DC | PRN
  Administered 2019-10-10 – 2019-10-14 (×5): 10 mg via ORAL
  Filled 2019-10-10 (×4): qty 2

## 2019-10-10 MED ORDER — SODIUM CHLORIDE 0.9 % IV SOLN
10.0000 mL/h | Freq: Once | INTRAVENOUS | Status: AC
  Administered 2019-10-10: 10 mL/h via INTRAVENOUS

## 2019-10-10 MED ORDER — SODIUM CHLORIDE 0.9 % IV SOLN: INTRAVENOUS | Status: DC

## 2019-10-10 MED ORDER — PANTOPRAZOLE SODIUM 40 MG IV SOLR
40.0000 mg | Freq: Two times a day (BID) | INTRAVENOUS | Status: DC
  Administered 2019-10-10 – 2019-10-11 (×3): 40 mg via INTRAVENOUS
  Filled 2019-10-10 (×3): qty 40

## 2019-10-10 MED ORDER — INSULIN ASPART 100 UNIT/ML ~~LOC~~ SOLN
0.0000 [IU] | Freq: Three times a day (TID) | SUBCUTANEOUS | Status: DC
  Administered 2019-10-12 – 2019-10-14 (×7): 1 [IU] via SUBCUTANEOUS
  Administered 2019-10-15: 2 [IU] via SUBCUTANEOUS
  Filled 2019-10-10: qty 0.09

## 2019-10-10 NOTE — ED Triage Notes (Signed)
Patient presents from home with dark watery stools which started 1000 today. He has had 3 episodes since with the last at 1200. He also complains of abdominal pain. Patient is A&O x4.   Hx diverticulitis  EMS vitals: 110/70 BP 174 CBG 97.9 Temp

## 2019-10-10 NOTE — H&P (Addendum)
History and Physical    AZARIEL BANIK AOZ:[id number] DOB: 03/11/1931 DOA: 10/10/2019  PCP: Seward Carol, MD   Patient coming from: Home    Chief Complaint: GI bleed  HPI: [name] is a 86 y.o. male with medical history significant of nonhemorrhagic CVA, hypertension, congestive heart failure with EF of 45%, type 2 diabetes mellitus, recurrent GI bleed and following with [name] presented to the emergency department today with complaints of large bloody bowel movement at home.  At the time he presented to the emergency department, he already had 3 more episodes.  Last episode was around 11:30 AM.  Last time he was admitted on May of this year with similar complaint.  At that time no intervention was done and he was transfused with a unit of PRBC.  Before that, he was admitted on March of this year. He underwent a full work-up on March with colonoscopy, EGD without finding of clear source of bleeding.  He also underwent CT angiogram and bleeding scan, the later suggested possible right colonic source.He had known history of diverticulosis so it was presumed the cause of GI bleed.  After a long discussion with his gastroenterologist( Dr Ardis Hughs), no further intervention in the future was planned. On presentation he was hemodynamically stable.  His hemoglobin was stable in the range of 13 on presentation.  In the emergency department, he had another big grossly bloody bowel movement after that he felt diaphoretic and ED physician ordered a unit of PRBC.  Patient has history of nonhemorrhagic CVA and takes Plavix at home. Patient seen and examined at the emergency department.  During my evaluation he was comfortable.  He denies any nausea, vomiting, abdominal pain, dysuria, shortness of breath, chest pain, palpitations.  He was feeling diaphoretic earlier but was already feeling better when I saw him.  ED Course: Hemoglobin was stable on presentation.  Hemoglobin slightly dropped in the range of  12 after he had a bloody bowel movement.  He was given a unit of PRBC GI consulted.  Patient not interested in any GI work-up and being admitted for monitoring.   Review of Systems: As per HPI otherwise 10 point review of systems negative.    Past Medical History:  Diagnosis Date  . Acute CVA (cerebrovascular accident) ([place]) 07/25/2015  . Asthma   . Benign essential HTN   . Cerebral infarction due to embolism of left middle cerebral artery ([place]) 02/03/2014  . Chronic combined systolic and diastolic CHF (congestive heart failure) ([place]) 12/23/2017  . Colitis   . Colon polyps   . Diabetes mellitus type 2, diet-controlled ([place]) 12/22/2013  . Diverticulosis   . GERD (gastroesophageal reflux disease)   . GI bleed   . Hemorrhoids   . Hiatal hernia   . History of esophageal strciture   . HLD (hyperlipidemia)   . Osteoarthritis   . Proctitis   . Status post dilation of esophageal narrowing   . Stroke [facility])     Past Surgical History:  Procedure Laterality Date  . ABDOMINAL HERNIA REPAIR    . BIOPSY  04/05/2019   Procedure: BIOPSY;  Surgeon: Thornton Gunawan, MD;  Location: [place];  Service: Gastroenterology;;  . COLONOSCOPY     multiple  . COLONOSCOPY WITH PROPOFOL N/A 12/12/2016   Procedure: COLONOSCOPY WITH PROPOFOL;  Surgeon: Gatha Mayer, MD;  Location: [facility] ENDOSCOPY;  Service: Endoscopy;  Laterality: N/A;  . COLONOSCOPY WITH PROPOFOL N/A 04/04/2019   Procedure: COLONOSCOPY WITH PROPOFOL;  Surgeon: Ardis Hughs,  Melene Plan, MD;  Location: [facility] ENDOSCOPY;  Service: Endoscopy;  Laterality: N/A;  . ESOPHAGOGASTRODUODENOSCOPY     multiple  . ESOPHAGOGASTRODUODENOSCOPY (EGD) WITH PROPOFOL N/A 04/05/2019   Procedure: ESOPHAGOGASTRODUODENOSCOPY (EGD) WITH PROPOFOL;  Surgeon: Thornton Eduardo, MD;  Location: [place];  Service: Gastroenterology;  Laterality: N/A;  . INSERTION OF MESH  01/29/2012   Procedure: INSERTION OF MESH;  Surgeon: Gwenyth Ober, MD;  Location: [place];  Service:  General;  Laterality: N/A;  . IR ANGIOGRAM SELECTIVE EACH ADDITIONAL VESSEL  04/01/2019  . IR ANGIOGRAM VISCERAL SELECTIVE  04/01/2019  . IR ANGIOGRAM VISCERAL SELECTIVE  04/01/2019  . IR ANGIOGRAM VISCERAL SELECTIVE  04/01/2019  . IR US GUIDE VASC ACCESS RIGHT  04/01/2019  . NISSEN FUNDOPLICATION    . SPLENECTOMY, TOTAL     nontraumatic rupture  . VENTRAL HERNIA REPAIR  01/29/2012    WITH MESH  . VENTRAL HERNIA REPAIR  01/29/2012   Procedure: HERNIA REPAIR VENTRAL ADULT;  Surgeon: Gwenyth Ober, MD;  Location: [place];  Service: General;  Laterality: N/A;  open recurrent ventral hernia repair with mesh     reports that he has never smoked. He has never used smokeless tobacco. He reports current alcohol use of about 2.0 standard drinks of alcohol per week. He reports that he does not use drugs.  Allergies  Allergen Reactions  . Tape Other (See Comments)    SKIN IS SENSITIVE; PLEASE USE PAPER TAPE; SKIN BRUISES AND TEARS EASILY!!  . Azithromycin Rash and Other (See Comments)    Pt had a Z-Pak Jan, 2020 - no reaction(??)    Family History  Problem Relation Age of Onset  . Alzheimer's disease Mother   . Breast cancer Mother   . Throat cancer Father   . Colon cancer Neg Hx   . Colon polyps Neg Hx   . Pancreatic cancer Neg Hx   . Stomach cancer Neg Hx   . Liver disease Neg Hx      Prior to Admission medications   Medication Sig Start Date End Date Taking? Authorizing Provider  albuterol (PROAIR HFA) 108 (90 Base) MCG/ACT inhaler Inhale 2 puffs into the lungs every 6 (six) hours as needed for wheezing or shortness of breath.    [provider]  albuterol (PROVENTIL) (2.5 MG/3ML) 0.083% nebulizer solution Take 3 mLs by nebulization every 6 (six) hours as needed for wheezing or shortness of breath.  10/04/18   [provider]  Alpha-D-Galactosidase (GAS-X PREVENTION) CAPS Take 1 capsule by mouth 2 (two) times daily with a meal. 05/10/19   Milus Banister, MD  budesonide  (PULMICORT) 0.5 MG/2ML nebulizer solution Take 2 mLs by nebulization 2 (two) times daily. 09/01/18   [provider]  carvedilol (COREG) 3.125 MG tablet Take 1 tablet (3.125 mg total) by mouth 2 (two) times daily with a meal. 02/26/16   Amin, Jeanella Flattery, MD  clopidogrel (PLAVIX) 75 MG tablet Take 1 tablet (75 mg total) by mouth daily. 06/25/19   Charlynne Cousins, MD  diphenhydramine-acetaminophen (TYLENOL PM) 25-500 MG TABS tablet Take 2 tablets by mouth at bedtime.     [provider]  ferrous sulfate 325 (65 FE) MG tablet Take 1 tablet (325 mg total) by mouth daily. 05/10/19   Milus Banister, MD  fluticasone [facility]) 50 MCG/ACT nasal spray Place 1 spray into both nostrils daily as needed for allergies or rhinitis.  06/18/14   [provider]  Melatonin 10 MG TABS Take 10  mg by mouth at bedtime as needed (for sleep).     [provider]  metFORMIN (GLUCOPHAGE) 1000 MG tablet Take 1 tablet (1,000 mg total) by mouth daily after breakfast. 06/22/19   Charlynne Cousins, MD  montelukast (SINGULAIR) 10 MG tablet Take 10 mg by mouth at bedtime as needed (for breathing flares).     [provider]  pantoprazole (PROTONIX) 40 MG tablet Take 1 tablet (40 mg total) by mouth 2 (two) times daily. 04/05/19 06/19/19  Harold Hedge, MD  polyethylene glycol (MIRALAX / Floria Raveling) packet Take 17 g by mouth daily as needed for mild constipation ([place]).     [provider]  psyllium (METAMUCIL) 58.6 % powder Take 1 packet by mouth daily as needed (for constipation- MIX AND DRINK).     [provider]  rOPINIRole (REQUIP) 1 MG tablet Take 2 mg by mouth at bedtime. 02/18/19   [provider]  simvastatin (ZOCOR) 40 MG tablet Take 1 tablet (40 mg total) by mouth daily. Patient taking differently: Take 40 mg by mouth at bedtime.  02/26/16   Damita Lack, MD    Physical Exam: Vitals:   10/10/19 1430 10/10/19 1445 10/10/19 1530 10/10/19  1615  BP: (!) 76/59 90/72 (!) 72/51 104/61  Pulse: 94 98 (!) 109 84  Resp: 12 18 (!) 26 15  Temp:      TempSrc:      SpO2: 97% 96% 96% 100%    Constitutional: Pleasant elderly gentleman, overall comfortable. Vitals:   10/10/19 1430 10/10/19 1445 10/10/19 1530 10/10/19 1615  BP: (!) 76/59 90/72 (!) 72/51 104/61  Pulse: 94 98 (!) 109 84  Resp: 12 18 (!) 26 15  Temp:      TempSrc:      SpO2: 97% 96% 96% 100%   Eyes: PERRL, lids and conjunctivae normal ENMT: Mucous membranes are moist.  Neck: normal, supple, no masses, no thyromegaly Respiratory: clear to auscultation bilaterally, no wheezing, no crackles. Normal respiratory effort. No accessory muscle use.  Cardiovascular: Regular rate and rhythm, no murmurs / rubs / gallops. No extremity edema.  Abdomen: no tenderness, no masses palpated. No hepatosplenomegaly. Bowel sounds positive.  Musculoskeletal: no clubbing / cyanosis. No joint deformity upper and lower extremities.  Skin: no rashes, lesions, ulcers. No induration Neurologic: CN 2-12 grossly intact.  Strength 5/5 in all 4.  Psychiatric: Normal judgment and insight. Alert and oriented x 3. Normal mood.   Foley Catheter:None  Labs on Admission: I have personally reviewed following labs and imaging studies  CBC: Recent Labs  Lab 10/10/19 1410 10/10/19 1540  WBC 9.5 10.0  NEUTROABS 7.2 7.0  HGB 13.4 12.1*  HCT 41.6 38.0*  MCV 97.9 97.7  PLT 309 637   Basic Metabolic Panel: Recent Labs  Lab 10/10/19 1410  NA 135  K 4.9  CL 101  CO2 26  GLUCOSE 164*  BUN 18  CREATININE 0.71  CALCIUM 8.6*   GFR: CrCl cannot be calculated (Unknown ideal weight.). Liver Function Tests: Recent Labs  Lab 10/10/19 1410  AST 13*  ALT 10  ALKPHOS 56  BILITOT 0.5  PROT 5.7*  ALBUMIN 3.1*   Recent Labs  Lab 10/10/19 1410  LIPASE 20   No results for input(s): AMMONIA in the last 168 hours. Coagulation Profile: No results for input(s): INR, PROTIME in the last 168  hours. Cardiac Enzymes: No results for input(s): CKTOTAL, CKMB, CKMBINDEX, TROPONINI in the last 168 hours. BNP (last 3  results) No results for input(s): PROBNP in the last 8760 hours. HbA1C: No results for input(s): HGBA1C in the last 72 hours. CBG: No results for input(s): GLUCAP in the last 168 hours. Lipid Profile: No results for input(s): CHOL, HDL, LDLCALC, TRIG, CHOLHDL, LDLDIRECT in the last 72 hours. Thyroid Function Tests: No results for input(s): TSH, T4TOTAL, FREET4, T3FREE, THYROIDAB in the last 72 hours. Anemia Panel: No results for input(s): VITAMINB12, FOLATE, FERRITIN, TIBC, IRON, RETICCTPCT in the last 72 hours. Urine analysis:    Component Value Date/Time   COLORURINE STRAW (A) 04/21/2018 2320   APPEARANCEUR CLEAR 04/21/2018 2320   LABSPEC 1.013 04/21/2018 2320   PHURINE 6.0 04/21/2018 2320   GLUCOSEU NEGATIVE 04/21/2018 2320   HGBUR NEGATIVE 04/21/2018 2320   BILIRUBINUR NEGATIVE 04/21/2018 2320   KETONESUR NEGATIVE 04/21/2018 2320   PROTEINUR NEGATIVE 04/21/2018 2320   UROBILINOGEN 0.2 12/22/2013 1210   NITRITE NEGATIVE 04/21/2018 2320   LEUKOCYTESUR NEGATIVE 04/21/2018 2320    Radiological Exams on Admission: No results found.   Assessment/Plan Active Problems:   Diabetes mellitus type II, non insulin dependent (HCC)   HLD (hyperlipidemia)   Chronic anticoagulation 2/2 history of CVA   PAF (paroxysmal atrial fibrillation) (HCC)   GI bleed   Chronic combined systolic and diastolic CHF (congestive heart failure) (H[facility])   Acute on chronic GI bleed: Presented with  bloody bowels at home, had another big bloody bowel movement in the emergency department.  Currently hemoglobin stable.  Became diaphoretic after the bloody bowel movement in the emergency department and was given IV fluids and unit of PRBC.  We will continue to monitor H&H.  GI consulted.  Patient prefers not to do any intervention on this admission. Last time he was admitted on March  of  this year with similar complaint.  He had known history of diverticulosis so it was presumed the cause of GI bleed.  He underwent a full work-up on last admission with colonoscopy, EGD without finding of clear source of bleeding.  He also underwent CT angiogram and bleeding scan, the latter suggested possible right colonic source. He was again admitted on May 2021 for similar complaints at the time work-up was not done but was transfused with a unit of PRBC and was discharged to home. This could be  from diverticular bleed but we have started on Protonix 40 mg IV twice daily for now.  Continue clear liquid diet for now.  N.p.o. after midnight for possible intervention if patient changes his mind. He takes iron supplements at home.   Nonhemorrhagic CVA: He said he has  left-sided residual weakness.  Ambulates with the help of walker.  Takes Plavix for the history of stroke.  Plavix on hold.  Chronic systolic congestive heart failure: Last echocardiogram showed ejection fraction 45%.  Euvolemic on presentation.  IV fluids started due to soft blood pressure.  Does not take any diuretics at home.  Diabetes type 2: Continue sliding scale insulin for now.  Continue to monitor CBGs.  History of restless leg syndrome: On ropinirole.  Hypertension: Looks like apparently he takes Carvidelol at home which is on hold due to soft blood pressure.  We will follow-up with pharmacy about reconciliation of his medications.  HLD: Takes  simvastatin    Severity of Illness: The appropriate patient status for this patient is OBSERVATION.    DVT prophylaxis: SCD Code Status: DNR Family Communication: Long discussion held with the wife on phone. Consults called: GI     Shavaun Osterloh  Kenji Mapel MD [facility]  10/10/2019, 4:52 PM

## 2019-10-10 NOTE — ED Provider Notes (Signed)
[facility] DEPT Provider Note   CSN: [id number] Arrival date & time: 10/10/19  1345     History Chief Complaint  Patient presents with  . GI Bleeding    [name] is a 86 y.o. male.  86 year old male with past medical history below including CVA, hypertension, CHF, type 2 diabetes mellitus, recurrent GI bleed who presents with GI bleeding.  Patient passed a large volume of stool mixed with dark red blood this morning and since that time he has had 3 more episodes of passing blood.  Last episode was around 11:30 AM.  He feels some queasiness in his stomach but denies any abdominal pain and denies any associated nausea, vomiting, fevers, urinary symptoms, shortness of breath, or chest pain.  This is very similar to previous episodes of GI bleeding.  He continues to take Plavix but no other anticoagulants.  He follows with Dr. Ardis Hughs with GI.  The history is provided by the patient.       Past Medical History:  Diagnosis Date  . Acute CVA (cerebrovascular accident) ([place]) 07/25/2015  . Asthma   . Benign essential HTN   . Cerebral infarction due to embolism of left middle cerebral artery ([place]) 02/03/2014  . Chronic combined systolic and diastolic CHF (congestive heart failure) ([place]) 12/23/2017  . Colitis   . Colon polyps   . Diabetes mellitus type 2, diet-controlled ([place]) 12/22/2013  . Diverticulosis   . GERD (gastroesophageal reflux disease)   . GI bleed   . Hemorrhoids   . Hiatal hernia   . History of esophageal strciture   . HLD (hyperlipidemia)   . Osteoarthritis   . Proctitis   . Status post dilation of esophageal narrowing   . Stroke [facility])     Patient Active Problem List   Diagnosis Date Noted  . Protein-calorie malnutrition, severe ([place]) 04/01/2019  . DNR (do not resuscitate) 04/01/2019  . Rectal bleed 08/21/2018  . Platelet inhibition due to Plavix   . Rectal bleeding   . Diverticulosis large intestine w/o perforation or abscess  w/bleeding   . GERD (gastroesophageal reflux disease) 04/21/2018  . Hematochezia   . Hypokalemia 12/25/2017  . Diverticulosis   . Acute blood loss anemia 12/24/2017  . Syncope due to orthostatic hypotension 12/23/2017  . Chronic combined systolic and diastolic CHF (congestive heart failure) ([place]) 12/23/2017  . GI bleed 10/25/2016  . BPPV (benign paroxysmal positional vertigo) 10/25/2016  . Lower GI bleed 10/24/2016  . Syncope 10/24/2016  . TIA (transient ischemic attack) 07/22/2016  . Benign essential HTN   . PAF (paroxysmal atrial fibrillation) ([place])   . Stroke-like symptom 02/22/2016  . Leukocytosis 02/22/2016  . Acute lower GI bleeding 02/01/2016  . History of lower GI bleeding Dec 2017 02/01/2016  . Chronic anticoagulation 2/2 history of CVA 01/31/2016  . History of CVA (cerebrovascular accident) 01/31/2016  . Sinusitis, chronic 12/12/2015  . Cough variant asthma 10/31/2015  . Insomnia 07/25/2015  . HLD (hyperlipidemia) 02/03/2014  . Ulnar neuropathy at elbow of right upper extremity 02/03/2014  . Cervical radiculopathy 02/03/2014  . Seizures ([place])   . Diabetes mellitus type II, non insulin dependent ([place]) 12/22/2013  . Recurrent ventral hernia 12/15/2011  . History of esophageal strciture   . Diverticulosis of colon with hemorrhage 06/24/2007    Past Surgical History:  Procedure Laterality Date  . ABDOMINAL HERNIA REPAIR    . BIOPSY  04/05/2019   Procedure: BIOPSY;  Surgeon: Thornton Brazil, MD;  Location: [facility];  Service: Gastroenterology;;  . COLONOSCOPY     multiple  . COLONOSCOPY WITH PROPOFOL N/A 12/12/2016   Procedure: COLONOSCOPY WITH PROPOFOL;  Surgeon: Gatha Mayer, MD;  Location: [facility] ENDOSCOPY;  Service: Endoscopy;  Laterality: N/A;  . COLONOSCOPY WITH PROPOFOL N/A 04/04/2019   Procedure: COLONOSCOPY WITH PROPOFOL;  Surgeon: Milus Banister, MD;  Location: [facility] ENDOSCOPY;  Service: Endoscopy;  Laterality: N/A;  . ESOPHAGOGASTRODUODENOSCOPY      multiple  . ESOPHAGOGASTRODUODENOSCOPY (EGD) WITH PROPOFOL N/A 04/05/2019   Procedure: ESOPHAGOGASTRODUODENOSCOPY (EGD) WITH PROPOFOL;  Surgeon: Thornton Gulyas, MD;  Location: [facility];  Service: Gastroenterology;  Laterality: N/A;  . INSERTION OF MESH  01/29/2012   Procedure: INSERTION OF MESH;  Surgeon: Gwenyth Ober, MD;  Location: [facility];  Service: General;  Laterality: N/A;  . IR ANGIOGRAM SELECTIVE EACH ADDITIONAL VESSEL  04/01/2019  . IR ANGIOGRAM VISCERAL SELECTIVE  04/01/2019  . IR ANGIOGRAM VISCERAL SELECTIVE  04/01/2019  . IR ANGIOGRAM VISCERAL SELECTIVE  04/01/2019  . IR US GUIDE VASC ACCESS RIGHT  04/01/2019  . NISSEN FUNDOPLICATION    . SPLENECTOMY, TOTAL     nontraumatic rupture  . VENTRAL HERNIA REPAIR  01/29/2012    WITH MESH  . VENTRAL HERNIA REPAIR  01/29/2012   Procedure: HERNIA REPAIR VENTRAL ADULT;  Surgeon: Gwenyth Ober, MD;  Location: [facility];  Service: General;  Laterality: N/A;  open recurrent ventral hernia repair with mesh       Family History  Problem Relation Age of Onset  . Alzheimer's disease Mother   . Breast cancer Mother   . Throat cancer Father   . Colon cancer Neg Hx   . Colon polyps Neg Hx   . Pancreatic cancer Neg Hx   . Stomach cancer Neg Hx   . Liver disease Neg Hx     Social History   Tobacco Use  . Smoking status: Never Smoker  . Smokeless tobacco: Never Used  Substance Use Topics  . Alcohol use: Yes    Alcohol/week: 2.0 standard drinks    Types: 2 Glasses of wine per week    Comment: daily  . Drug use: No    Home Medications Prior to Admission medications   Medication Sig Start Date End Date Taking? Authorizing Provider  albuterol (PROAIR HFA) 108 (90 Base) MCG/ACT inhaler Inhale 2 puffs into the lungs every 6 (six) hours as needed for wheezing or shortness of breath.    [provider]  albuterol (PROVENTIL) (2.5 MG/3ML) 0.083% nebulizer solution Take 3 mLs by nebulization every 6 (six) hours as needed for wheezing  or shortness of breath.  10/04/18   [provider]  Alpha-D-Galactosidase (GAS-X PREVENTION) CAPS Take 1 capsule by mouth 2 (two) times daily with a meal. 05/10/19   Milus Banister, MD  budesonide (PULMICORT) 0.5 MG/2ML nebulizer solution Take 2 mLs by nebulization 2 (two) times daily. 09/01/18   [provider]  carvedilol (COREG) 3.125 MG tablet Take 1 tablet (3.125 mg total) by mouth 2 (two) times daily with a meal. 02/26/16   Amin, Jeanella Flattery, MD  clopidogrel (PLAVIX) 75 MG tablet Take 1 tablet (75 mg total) by mouth daily. 06/25/19   Charlynne Cousins, MD  diphenhydramine-acetaminophen (TYLENOL PM) 25-500 MG TABS tablet Take 2 tablets by mouth at bedtime.     [provider]  ferrous sulfate 325 (65 FE) MG tablet Take 1 tablet (325 mg total) by mouth daily. 05/10/19   Milus Banister, MD  fluticasone (  FLONASE) 50 MCG/ACT nasal spray Place 1 spray into both nostrils daily as needed for allergies or rhinitis.  06/18/14   [provider]  Melatonin 10 MG TABS Take 10 mg by mouth at bedtime as needed (for sleep).     [provider]  metFORMIN (GLUCOPHAGE) 1000 MG tablet Take 1 tablet (1,000 mg total) by mouth daily after breakfast. 06/22/19   Charlynne Cousins, MD  montelukast (SINGULAIR) 10 MG tablet Take 10 mg by mouth at bedtime as needed (for breathing flares).     [provider]  pantoprazole (PROTONIX) 40 MG tablet Take 1 tablet (40 mg total) by mouth 2 (two) times daily. 04/05/19 06/19/19  Harold Hedge, MD  polyethylene glycol (MIRALAX / Floria Raveling) packet Take 17 g by mouth daily as needed for mild constipation ([place]).     [provider]  psyllium (METAMUCIL) 58.6 % powder Take 1 packet by mouth daily as needed (for constipation- MIX AND DRINK).     [provider]  rOPINIRole (REQUIP) 1 MG tablet Take 2 mg by mouth at bedtime. 02/18/19   [provider]  simvastatin (ZOCOR) 40 MG tablet Take 1 tablet (40  mg total) by mouth daily. Patient taking differently: Take 40 mg by mouth at bedtime.  02/26/16   Damita Lack, MD    Allergies    Tape and Azithromycin  Review of Systems   Review of Systems All other systems reviewed and are negative except that which was mentioned in HPI  Physical Exam Updated Vital Signs BP 104/61   Pulse 84   Temp 98.4 F (36.9 C) (Oral)   Resp 15   SpO2 100%   Physical Exam Vitals and nursing note reviewed.  Constitutional:      General: He is not in acute distress.    Appearance: He is well-developed.  HENT:     Head: Normocephalic and atraumatic.     Mouth/Throat:     Mouth: Mucous membranes are moist.     Pharynx: Oropharynx is clear.  Eyes:     Conjunctiva/sclera: Conjunctivae normal.  Cardiovascular:     Rate and Rhythm: Regular rhythm. Tachycardia present.     Heart sounds: Normal heart sounds. No murmur heard.   Pulmonary:     Effort: Pulmonary effort is normal.     Breath sounds: Normal breath sounds.  Abdominal:     General: Bowel sounds are normal. There is no distension.     Palpations: Abdomen is soft.     Tenderness: There is no abdominal tenderness.  Musculoskeletal:     Cervical back: Neck supple.     Right lower leg: No edema.     Left lower leg: No edema.  Skin:    General: Skin is warm and dry.  Neurological:     Mental Status: He is alert and oriented to person, place, and time.     Comments: Fluent speech  Psychiatric:        Judgment: Judgment normal.     ED Results / Procedures / Treatments   Labs (all labs ordered are listed, but only abnormal results are displayed) Labs Reviewed  COMPREHENSIVE METABOLIC PANEL - Abnormal; Notable for the following components:      Result Value   Glucose, Bld 164 (*)    Calcium 8.6 (*)    Total Protein 5.7 (*)    Albumin 3.1 (*)    AST 13 (*)    All other components within normal limits  CBC WITH DIFFERENTIAL/PLATELET - Abnormal; Notable for the following  components:   RBC 3.89 (*)    Hemoglobin 12.1 (*)    HCT 38.0 (*)    All other components within normal limits  LIPASE, BLOOD  LACTIC ACID, PLASMA  CBC WITH DIFFERENTIAL/PLATELET  TYPE AND SCREEN  PREPARE RBC (CROSSMATCH)    EKG None  Radiology No results found.  Procedures Procedures (including critical care time) CRITICAL CARE Performed by: Wenda Overland Sevana Grandinetti   Total critical care time: 30 minutes  Critical care time was exclusive of separately billable procedures and treating other patients.  Critical care was necessary to treat or prevent imminent or life-threatening deterioration.  Critical care was time spent personally by me on the following activities: development of treatment plan with patient and/or surrogate as well as nursing, discussions with consultants, evaluation of patient's response to treatment, examination of patient, obtaining history from patient or surrogate, ordering and performing treatments and interventions, ordering and review of laboratory studies, ordering and review of radiographic studies, pulse oximetry and re-evaluation of patient's condition.  Medications Ordered in ED Medications  0.9 %  sodium chloride infusion (has no administration in time range)  0.9 %  sodium chloride infusion (has no administration in time range)  ferrous sulfate tablet 325 mg (has no administration in time range)  Melatonin TABS 10 mg (has no administration in time range)  rOPINIRole (REQUIP) tablet 2 mg (has no administration in time range)  albuterol (VENTOLIN HFA) 108 (90 Base) MCG/ACT inhaler 2 puff (has no administration in time range)  albuterol (PROVENTIL) (2.5 MG/3ML) 0.083% nebulizer solution 3 mL (has no administration in time range)  budesonide (PULMICORT) nebulizer solution 0.5 mg (has no administration in time range)  fluticasone (FLONASE) 50 MCG/ACT nasal spray 1 spray (has no administration in time range)  montelukast (SINGULAIR) tablet 10 mg (has  no administration in time range)  lactated ringers bolus 1,000 mL (1,000 mLs Intravenous New Bag/Given 10/10/19 1546)    ED Course  I have reviewed the triage vital signs and the nursing notes.  Pertinent labs & imaging results that were available during my care of the patient were reviewed by me and considered in my medical decision making (see chart for details).    MDM Rules/Calculators/A&P                          This is a pleasant gentleman who has had multiple previous admissions for lower GI bleeding without source identified on extensive previous testing. I actually took care of this patient the last time he was admitted for this earlier this year. He was pleasant and comfortable on exam, no abd tenderness. Mildly tachycardic, BP low end of normal. He continues to desire no invasive testing or aggressive interventions given his extensive previous work up but he is amenable to blood transfusion if necessary.   Initial lab work shows hemoglobin 12.1.  While in the ED, patient had a large bowel movement of entirely dark red blood filling the bedside commode.  He then became diaphoretic and pale and blood pressure dropped.  He had a very similar episode during my last encounter with him in the ED which ultimately seem to be a vasovagal episode because his blood pressure quickly returned to normal that time. This time, he was initially hypotensive; I hung a liter of IVF; then BP quickly returned to 104/61 again. Based on the large volume of blood he passed here and vagal  episode, I ordered 1 upRBCs after verbal consent. Discussed admission w/ Dr. Tawanna Solo, [facility]. I have also consulted [facility], spoke w/ APP Nevin Bloodgood.  Final Clinical Impression(s) / ED Diagnoses Final diagnoses:  Lower GI bleed    Rx / DC Orders ED Discharge Orders    None       Aristeo Hankerson, Wenda Overland, MD 10/10/19 1655

## 2019-10-10 NOTE — ED Triage Notes (Signed)
EMS reports from home, noticed dark blood in stool this morning, and 2 more since, last at noon. Pt states had similar episodes prior, Dx with Diverticulitis. No blood thinners, Pt states has Hx of syncopal episodes.   BP 110/70 HR 90 RR 16 Sp02 96 RA Temp 97.9 CBG 174

## 2019-10-10 NOTE — ED Notes (Signed)
Updated patient's wife regarding plan of care

## 2019-10-10 NOTE — Telephone Encounter (Signed)
Spouse tells me that Dr Ardis Hughs has requested to be notified whenever Mr Glinski goes to the emergency room.

## 2019-10-11 ENCOUNTER — Ambulatory Visit: Payer: Medicare HMO | Admitting: Adult Health

## 2019-10-11 DIAGNOSIS — Z8719 Personal history of other diseases of the digestive system: Secondary | ICD-10-CM | POA: Diagnosis not present

## 2019-10-11 DIAGNOSIS — D62 Acute posthemorrhagic anemia: Secondary | ICD-10-CM

## 2019-10-11 DIAGNOSIS — K5791 Diverticulosis of intestine, part unspecified, without perforation or abscess with bleeding: Secondary | ICD-10-CM | POA: Diagnosis not present

## 2019-10-11 DIAGNOSIS — Z9081 Acquired absence of spleen: Secondary | ICD-10-CM | POA: Diagnosis not present

## 2019-10-11 DIAGNOSIS — Z79899 Other long term (current) drug therapy: Secondary | ICD-10-CM | POA: Diagnosis not present

## 2019-10-11 DIAGNOSIS — J45909 Unspecified asthma, uncomplicated: Secondary | ICD-10-CM | POA: Diagnosis present

## 2019-10-11 DIAGNOSIS — Z66 Do not resuscitate: Secondary | ICD-10-CM | POA: Diagnosis present

## 2019-10-11 DIAGNOSIS — I5042 Chronic combined systolic (congestive) and diastolic (congestive) heart failure: Secondary | ICD-10-CM | POA: Diagnosis present

## 2019-10-11 DIAGNOSIS — Z7902 Long term (current) use of antithrombotics/antiplatelets: Secondary | ICD-10-CM | POA: Diagnosis not present

## 2019-10-11 DIAGNOSIS — K5731 Diverticulosis of large intestine without perforation or abscess with bleeding: Secondary | ICD-10-CM | POA: Diagnosis present

## 2019-10-11 DIAGNOSIS — Z7951 Long term (current) use of inhaled steroids: Secondary | ICD-10-CM | POA: Diagnosis not present

## 2019-10-11 DIAGNOSIS — Z7984 Long term (current) use of oral hypoglycemic drugs: Secondary | ICD-10-CM | POA: Diagnosis not present

## 2019-10-11 DIAGNOSIS — E119 Type 2 diabetes mellitus without complications: Secondary | ICD-10-CM | POA: Diagnosis present

## 2019-10-11 DIAGNOSIS — K922 Gastrointestinal hemorrhage, unspecified: Secondary | ICD-10-CM | POA: Diagnosis present

## 2019-10-11 DIAGNOSIS — E785 Hyperlipidemia, unspecified: Secondary | ICD-10-CM | POA: Diagnosis present

## 2019-10-11 DIAGNOSIS — K921 Melena: Secondary | ICD-10-CM

## 2019-10-11 DIAGNOSIS — G2581 Restless legs syndrome: Secondary | ICD-10-CM | POA: Diagnosis present

## 2019-10-11 DIAGNOSIS — I110 Hypertensive heart disease with heart failure: Secondary | ICD-10-CM | POA: Diagnosis present

## 2019-10-11 DIAGNOSIS — R61 Generalized hyperhidrosis: Secondary | ICD-10-CM | POA: Diagnosis present

## 2019-10-11 DIAGNOSIS — K219 Gastro-esophageal reflux disease without esophagitis: Secondary | ICD-10-CM | POA: Diagnosis present

## 2019-10-11 DIAGNOSIS — Z20822 Contact with and (suspected) exposure to covid-19: Secondary | ICD-10-CM | POA: Diagnosis present

## 2019-10-11 DIAGNOSIS — Z820 Family history of epilepsy and other diseases of the nervous system: Secondary | ICD-10-CM | POA: Diagnosis not present

## 2019-10-11 DIAGNOSIS — Z888 Allergy status to other drugs, medicaments and biological substances status: Secondary | ICD-10-CM | POA: Diagnosis not present

## 2019-10-11 DIAGNOSIS — I480 Paroxysmal atrial fibrillation: Secondary | ICD-10-CM | POA: Diagnosis present

## 2019-10-11 DIAGNOSIS — Z7901 Long term (current) use of anticoagulants: Secondary | ICD-10-CM | POA: Diagnosis not present

## 2019-10-11 DIAGNOSIS — I69354 Hemiplegia and hemiparesis following cerebral infarction affecting left non-dominant side: Secondary | ICD-10-CM | POA: Diagnosis not present

## 2019-10-11 DIAGNOSIS — Z91048 Other nonmedicinal substance allergy status: Secondary | ICD-10-CM | POA: Diagnosis not present

## 2019-10-11 LAB — BASIC METABOLIC PANEL
Anion gap: 8 (ref 5–15)
BUN: 27 mg/dL — ABNORMAL HIGH (ref 8–23)
CO2: 23 mmol/L (ref 22–32)
Calcium: 8.1 mg/dL — ABNORMAL LOW (ref 8.9–10.3)
Chloride: 105 mmol/L (ref 98–111)
Creatinine, Ser: 0.62 mg/dL (ref 0.61–1.24)
GFR calc Af Amer: >60 mL/min (ref >60)
GFR calc non Af Amer: >60 mL/min (ref >60)
Glucose, Bld: 147 mg/dL — ABNORMAL HIGH (ref 70–99)
Potassium: 4.1 mmol/L (ref 3.5–5.1)
Sodium: 136 mmol/L (ref 135–145)

## 2019-10-11 LAB — GLUCOSE, CAPILLARY
Glucose-Capillary: 116 mg/dL — ABNORMAL HIGH (ref 70–99)
Glucose-Capillary: 124 mg/dL — ABNORMAL HIGH (ref 70–99)
Glucose-Capillary: 93 mg/dL (ref 70–99)
Glucose-Capillary: 105 mg/dL — ABNORMAL HIGH (ref 70–99)

## 2019-10-11 LAB — CBC
HCT: 33.3 % — ABNORMAL LOW (ref 39.0–52.0)
Hemoglobin: 10.7 g/dL — ABNORMAL LOW (ref 13.0–17.0)
MCH: 31.5 pg (ref 26.0–34.0)
MCHC: 32.1 g/dL (ref 30.0–36.0)
MCV: 97.9 fL (ref 80.0–100.0)
Platelets: 208 10*3/uL (ref 150–400)
RBC: 3.4 MIL/uL — ABNORMAL LOW (ref 4.22–5.81)
RDW: 15.8 % — ABNORMAL HIGH (ref 11.5–15.5)
WBC: 11.4 10*3/uL — ABNORMAL HIGH (ref 4.0–10.5)
nRBC: 0 % (ref 0.0–0.2)

## 2019-10-11 LAB — HEMOGLOBIN AND HEMATOCRIT, BLOOD
HCT: 32.3 % — ABNORMAL LOW (ref 39.0–52.0)
HCT: 32 % — ABNORMAL LOW (ref 39.0–52.0)
Hemoglobin: 10.4 g/dL — ABNORMAL LOW (ref 13.0–17.0)
Hemoglobin: 10.4 g/dL — ABNORMAL LOW (ref 13.0–17.0)

## 2019-10-11 MED ORDER — PROSOURCE PLUS PO LIQD
30.0000 mL | Freq: Two times a day (BID) | ORAL | Status: DC
  Administered 2019-10-11 – 2019-10-15 (×8): 30 mL via ORAL
  Filled 2019-10-11 (×8): qty 30

## 2019-10-11 MED ORDER — ADULT MULTIVITAMIN W/MINERALS CH
1.0000 | ORAL_TABLET | Freq: Every day | ORAL | Status: DC
  Administered 2019-10-11 – 2019-10-15 (×5): 1 via ORAL
  Filled 2019-10-11 (×5): qty 1

## 2019-10-11 NOTE — Evaluation (Signed)
Physical Therapy Evaluation Patient Details Name: [name] MRN: [id number] DOB: 23-Mar-1931 Today's Date: 10/11/2019   History of Present Illness  86 y.o. male with medical history significant of nonhemorrhagic CVA (residual left sided deficits), hypertension, congestive heart failure with EF of 45%, type 2 diabetes mellitus, recurrent GI bleed and presented to the emergency department with multiple episodes of bloody bowel movement and admitted for GI bleed  Clinical Impression  Pt admitted with above diagnosis.  Pt currently with functional limitations due to the deficits listed below (see PT Problem List). Pt will benefit from skilled PT to increase their independence and safety with mobility to allow discharge to the venue listed below.  Pt eager to mobilize and ambulated in hallway with RW.  Pt plans to d/c back to [facility] and feels comfortable returning there with spouse.     Follow Up Recommendations No PT follow up    Equipment Recommendations  None recommended by PT    Recommendations for Other Services       Precautions / Restrictions Precautions Precautions: Fall Restrictions Weight Bearing Restrictions: No      Mobility  Bed Mobility Overal bed mobility: Needs Assistance Bed Mobility: Supine to Sit     Supine to sit: Supervision;HOB elevated        Transfers Overall transfer level: Needs assistance Equipment used: Rolling walker (2 wheeled) Transfers: Sit to/from Stand Sit to Stand: Min guard         General transfer comment: min/guard for safety, increased time and effort  Ambulation/Gait Ambulation/Gait assistance: Min guard Gait Distance (Feet): 120 Feet Assistive device: Rolling walker (2 wheeled) Gait Pattern/deviations: Step-through pattern;Decreased stride length;Decreased dorsiflexion - left     General Gait Details: pt did not ambulate with L AFO so he did have gait compensations however steady with RW (was wearing L knee  brace)  Stairs            Wheelchair Mobility    Modified Rankin (Stroke Patients Only)       Balance                                             Pertinent Vitals/Pain Pain Assessment: No/denies pain    Home Living Family/patient expects to be discharged to:: Private residence Living Arrangements: Spouse/significant other Available Help at Discharge: Family;Available 24 hours/day Type of Home: Independent living facility [facility]) Home Access: Level entry     Home Layout: [place]: Grab bars - tub/shower;Grab bars - toilet;Walker - 2 wheels;Walker - 4 wheels;Wheelchair - manual;Hand held shower head;Shower seat - built in Additional Comments: lives at [facility] with his wife    Prior Function Level of Independence: Independent with assistive device(s)         Comments: Uses rollator - can walk community distances if he has to but typically does not; independent with ADLs- IADLs partially managed by facility     Hand Dominance        Extremity/Trunk Assessment        Lower Extremity Assessment Lower Extremity Assessment: LLE deficits/detail LLE Deficits / Details: knee hyperextension - wears brace, L AFO for foot drop; residual left sided deficits from previous CVA       Communication   Communication: No difficulties  Cognition Arousal/Alertness: Awake/alert Behavior During Therapy: WFL for tasks assessed/performed Overall Cognitive Status: Within Functional Limits  for tasks assessed                                        General Comments      Exercises     Assessment/Plan    PT Assessment Patient needs continued PT services  PT Problem List Decreased mobility;Decreased activity tolerance       PT Treatment Interventions DME instruction;Gait training;Balance training;Therapeutic exercise;Functional mobility training;Therapeutic activities;Patient/family education    PT Goals  (Current goals can be found in the Care Plan section)  Acute Rehab PT Goals PT Goal Formulation: With patient Time For Goal Achievement: 10/26/19 Potential to Achieve Goals: Good    Frequency Min 3X/week   Barriers to discharge        Co-evaluation               AM-PAC PT "6 Clicks" Mobility  Outcome Measure Help needed turning from your back to your side while in a flat bed without using bedrails?: None Help needed moving from lying on your back to sitting on the side of a flat bed without using bedrails?: None Help needed moving to and from a bed to a chair (including a wheelchair)?: A Little Help needed standing up from a chair using your arms (e.g., wheelchair or bedside chair)?: A Little Help needed to walk in hospital room?: A Little Help needed climbing 3-5 steps with a railing? : A Lot 6 Click Score: 19    End of Session Equipment Utilized During Treatment: Gait belt Activity Tolerance: Patient tolerated treatment well Patient left: in chair;with call bell/phone within reach;with family/visitor present (pt and spouse aware to call for assist for pt out of chair for safety)   PT Visit Diagnosis: Difficulty in walking, not elsewhere classified (R26.2)    Time: 2952-8413 PT Time Calculation (min) (ACUTE ONLY): 20 min   Charges:   PT Evaluation $PT Eval Low Complexity: 1 Low        Kati PT, DPT Acute Rehabilitation Services Pager: [phone] Office: [phone]  York Ram E 10/11/2019, 1:43 PM

## 2019-10-11 NOTE — Progress Notes (Signed)
Initial Nutrition Assessment  DOCUMENTATION CODES:   Not applicable  INTERVENTION:  - will order Magic Cup BID with meals, each supplement provides 290 kcal and 9 grams of protein. - will order 30 mL Prosource Plus BID, each supplement provides 100 kcal and 15 grams of protein. - will order 1 tablet multivitamin with minerals/day. - diet advancement as medically feasible.   NUTRITION DIAGNOSIS:   Inadequate protein intake related to other (see comment) (current diet order) as evidenced by other (comment) (FLD does not meet estimated protein need.)  GOAL:   Patient will meet greater than or equal to 90% of their needs  MONITOR:   PO intake, Supplement acceptance, Diet advancement, Labs, Weight trends  REASON FOR ASSESSMENT:   Malnutrition Screening Tool    ASSESSMENT:   86 y.o. male with medical history of non-hemorrhagic CVA, HTN, CHF, type 2 DM, recurrent GIB. He presented to the ED with multiple episodes of bloody BMs. GI consulted.  He ate 50% of breakfast this AM on CLD (50 kcal, 1 gram protein). Diet was advanced from CLD to FLD at 1120 and lunch had not yet been delivered.  Patient reports good appetite at baseline and he is unsure if he has experienced any weight changes as intakes had been decreased for several days PTA due to feeling nauseated. He did not experience any vomiting during that time. Nausea has improved since admission, not fully gone.   Per chart review, weight today is 149 lb, weight on 5/6 was 144 lb, and weight on 3/24 was 150 lb.  Per notes: - acute on chronic GIB - hx of non-hemorrhagic CVA with L-sided residual weakness, ambulates with a walker - Observation status with plan for home at the time of d/c   Labs reviewed; CBGs: 116 and 124 mg/dl, BUN: 27 mg/dl, Ca: 8.1 mg/dl. Medications reviewed; sliding scale novolog.     NUTRITION - FOCUSED PHYSICAL EXAM:    Most Recent Value  Orbital Region No depletion  Upper Arm Region Mild  depletion  Thoracic and Lumbar Region Unable to assess  Buccal Region No depletion  Temple Region No depletion  Clavicle Bone Region Mild depletion  Clavicle and Acromion Bone Region Mild depletion  Dorsal Hand No depletion  Patellar Region No depletion  Anterior Thigh Region Unable to assess  Posterior Calf Region No depletion  Edema (RD Assessment) None  Hair Reviewed  Eyes Reviewed  Mouth Reviewed  Skin Reviewed  Nails Reviewed       Diet Order:   Diet Order            Diet full liquid Room service appropriate? Yes; Fluid consistency: Thin  Diet effective now                 EDUCATION NEEDS:   No education needs have been identified at this time  Skin:  Skin Assessment: Reviewed RN Assessment  Last BM:  8/24  Height:   Ht Readings from Last 1 Encounters:  10/11/19 5\' 10"  (1.778 m)    Weight:   Wt Readings from Last 1 Encounters:  10/11/19 67.5 kg    Estimated Nutritional Needs:  Kcal:  1500-1700 kcal Protein:  75-85 grams Fluid:  >/= 2 L/day     Jarome Matin, MS, RD, LDN, CNSC Inpatient Clinical Dietitian RD pager # available in [facility]  After hours/weekend pager # available in [facility]

## 2019-10-11 NOTE — Plan of Care (Signed)

## 2019-10-11 NOTE — Progress Notes (Signed)
PROGRESS NOTE    [name]  IOX:[id number] DOB: 1931-04-22 DOA: 10/10/2019 PCP: Seward Carol, MD   Brief Narrative:  [name] is a 86 y.o. male with medical history significant of nonhemorrhagic CVA, hypertension, congestive heart failure with EF of 45%, type 2 diabetes mellitus, recurrent GI bleed and following with [name] presented to the emergency department with multiple episodes of bloody bowel movement.Last time he was admitted on May of this year with similar complaint.  At that time no intervention was done and he was transfused with a unit of PRBC.  Before that, he was admitted on March of this year. He underwent a full work-up on March with colonoscopy, EGD without finding of clear source of bleeding.  He also underwent CT angiogram and bleeding scan, the later suggested possible right colonic source.He had known history of diverticulosis so it was presumed the cause of GI bleed.  After a long discussion with his gastroenterologist( Dr Ardis Hughs), no further intervention in the future was planned. On presentation he was hemodynamically stable.  His hemoglobin was stable in the range of 13 on presentation. GI is following.  Plan is to monitor him 1 more night.  He did not have any further bloody bowel movements since yesterday.  Assessment & Plan:   Principal Problem:   GI bleed Active Problems:   Diabetes mellitus type II, non insulin dependent (HCC)   HLD (hyperlipidemia)   Chronic anticoagulation 2/2 history of CVA   Chronic combined systolic and diastolic CHF (congestive heart failure) (HCC)   Acute on chronic GI bleed: Presented with  bloody bowels at home, had another big bloody bowel movement in the emergency department.    Became diaphoretic after the bloody bowel movement in the emergency department and was given IV fluids and unit of PRBC.  We will continue to monitor H&H.  GI consulted.  Patient prefers not to do any intervention on this admission. Last time he  was admitted on March  of this year with similar complaint.  He had known history of diverticulosis so it was presumed the cause of GI bleed.  He underwent a full work-up on last admission with colonoscopy, EGD without finding of clear source of bleeding.  He also underwent CT angiogram and bleeding scan, the latter suggested possible right colonic source. He was again admitted on May 2021 for similar complaints at the time work-up was not done but was transfused with a unit of PRBC and was discharged to home. This could be  from diverticular bleed but we have started on Protonix 40 mg IV twice daily for now.   GI is following.  Plan is to monitor him 1 more night.  He did not have any further bloody bowel movements since yesterday. Diet advanced to full today.   Nonhemorrhagic CVA: He said he has  left-sided residual weakness.  Ambulates with the help of walker.  Takes Plavix for the history of stroke.  Plavix on hold.  Chronic systolic congestive heart failure: Last echocardiogram showed ejection fraction 45%.  Euvolemic on presentation.  IV fluids started due to soft blood pressure,now stopped.  Does not take any diuretics at home.  Diabetes type 2: Continue sliding scale insulin for now.  Continue to monitor CBGs.  History of restless leg syndrome: On ropinirole.  Hypertension:  Blood pressure is well controlled.  HLD: Takes  simvastatin            DVT prophylaxis: SCD Code Status: DNR Family Communication:  Discussed with wife on phone on 10/10/2019. Status is: Observation  The patient remains OBS appropriate and will d/c before 2 midnights.  Dispo: The patient is from: Home              Anticipated d/c is to: Home              Anticipated d/c date is: 1 day              Patient currently is not medically stable to d/c.  Needs to be monitored 1 more night before safe discharge.   Consultants: GI  Procedures:None  Antimicrobials:  Anti-infectives (From admission,  onward)   None      Subjective: Patient seen and examined the bedside this morning.  Hemodynamically stable.  Feels comfortable.  Very eager to go home.  Did not have any bowel movement since yesterday.  No bleeding.  Denies any abdomen pain, nausea or vomiting.  I discussed with him about the importance of monitoring him for 1 more night.  Objective: Vitals:   10/11/19 0258 10/11/19 0300 10/11/19 0701 10/11/19 0812  BP: 121/84  111/68   Pulse: 95  88   Resp: 18  20   Temp: 97.8 F (36.6 C)  98.3 F (36.8 C)   TempSrc: Oral  Oral   SpO2: 98%  96% 93%  Weight:  67.5 kg    Height:  5\' 10"  (1.778 m)      Intake/Output Summary (Last 24 hours) at 10/11/2019 1120 Last data filed at 10/11/2019 1034 Gross per 24 hour  Intake 1949 ml  Output 450 ml  Net 1499 ml   Filed Weights   10/11/19 0300  Weight: 67.5 kg    Examination:  General exam: Appears calm and comfortable ,Not in distress, pleasant elderly male HEENT:PERRL,Oral mucosa moist, Ear/Nose normal on gross exam Respiratory system: Bilateral equal air entry, normal vesicular breath sounds, no wheezes or crackles  Cardiovascular system: S1 & S2 heard, RRR. No JVD, murmurs, rubs, gallops or clicks. No pedal edema. Gastrointestinal system: Abdomen is nondistended, soft and nontender. No organomegaly or masses felt. Normal bowel sounds heard. Central nervous system: Alert and oriented. No focal neurological deficits. Extremities: No edema, no clubbing ,no cyanosis Skin: No rashes, lesions or ulcers,no icterus ,no pallor   Data Reviewed: I have personally reviewed following labs and imaging studies  CBC: Recent Labs  Lab 10/10/19 1410 10/10/19 1410 10/10/19 1540 10/10/19 2138 10/11/19 0147 10/11/19 0525 10/11/19 0948  WBC 9.5  --  10.0  --   --  11.4*  --   NEUTROABS 7.2  --  7.0  --   --   --   --   HGB 13.4   < > 12.1* 12.0* 10.4* 10.7* 10.4*  HCT 41.6   < > 38.0* 37.7* 32.3* 33.3* 32.0*  MCV 97.9  --  97.7  --    --  97.9  --   PLT 309  --  315  --   --  208  --    < > = values in this interval not displayed.   Basic Metabolic Panel: Recent Labs  Lab 10/10/19 1410 10/11/19 0525  NA 135 136  K 4.9 4.1  CL 101 105  CO2 26 23  GLUCOSE 164* 147*  BUN 18 27*  CREATININE 0.71 0.62  CALCIUM 8.6* 8.1*   GFR: Estimated Creatinine Clearance: 63.3 mL/min (by C-G formula based on SCr of 0.62 mg/dL). Liver Function Tests: Recent Labs  Lab  10/10/19 1410  AST 13*  ALT 10  ALKPHOS 56  BILITOT 0.5  PROT 5.7*  ALBUMIN 3.1*   Recent Labs  Lab 10/10/19 1410  LIPASE 20   No results for input(s): AMMONIA in the last 168 hours. Coagulation Profile: No results for input(s): INR, PROTIME in the last 168 hours. Cardiac Enzymes: No results for input(s): CKTOTAL, CKMB, CKMBINDEX, TROPONINI in the last 168 hours. BNP (last 3 results) No results for input(s): PROBNP in the last 8760 hours. HbA1C: No results for input(s): HGBA1C in the last 72 hours. CBG: Recent Labs  Lab 10/10/19 2142 10/11/19 0800  GLUCAP 145* 116*   Lipid Profile: No results for input(s): CHOL, HDL, LDLCALC, TRIG, CHOLHDL, LDLDIRECT in the last 72 hours. Thyroid Function Tests: No results for input(s): TSH, T4TOTAL, FREET4, T3FREE, THYROIDAB in the last 72 hours. Anemia Panel: No results for input(s): VITAMINB12, FOLATE, FERRITIN, TIBC, IRON, RETICCTPCT in the last 72 hours. Sepsis Labs: Recent Labs  Lab 10/10/19 1410  LATICACIDVEN 1.9    Recent Results (from the past 240 hour(s))  SARS Coronavirus 2 by RT PCR (hospital order, performed in [facility] hospital lab) Nasopharyngeal Nasopharyngeal Swab     Status: None   Collection Time: 10/10/19  5:38 PM   Specimen: Nasopharyngeal Swab  Result Value Ref Range Status   SARS Coronavirus 2 NEGATIVE NEGATIVE Final    Comment: (NOTE) SARS-CoV-2 target nucleic acids are NOT DETECTED.  The SARS-CoV-2 RNA is generally detectable in upper and lower respiratory specimens  during the acute phase of infection. The lowest concentration of SARS-CoV-2 viral copies this assay can detect is 250 copies / mL. A negative result does not preclude SARS-CoV-2 infection and should not be used as the sole basis for treatment or other patient management decisions.  A negative result may occur with improper specimen collection / handling, submission of specimen other than nasopharyngeal swab, presence of viral mutation(s) within the areas targeted by this assay, and inadequate number of viral copies (<250 copies / mL). A negative result must be combined with clinical observations, patient history, and epidemiological information.  Fact Sheet for Patients:   StrictlyIdeas.no  Fact Sheet for Healthcare Providers: BankingDealers.co.za  This test is not yet approved or  cleared by the [place] FDA and has been authorized for detection and/or diagnosis of SARS-CoV-2 by FDA under an Emergency Use Authorization (EUA).  This EUA will remain in effect (meaning this test can be used) for the duration of the COVID-19 declaration under Section 564(b)(1) of the Act, 21 U.S.C. section 360bbb-3(b)(1), unless the authorization is terminated or revoked sooner.  Performed at [facility], [place] [address] [place], [place] [address]          Radiology Studies: No results found.      Scheduled Meds: . budesonide  2 mL Nebulization BID  . ferrous sulfate  325 mg Oral Daily  . insulin aspart  0-9 Units Subcutaneous TID WC  . pantoprazole (PROTONIX) IV  40 mg Intravenous Q12H  . rOPINIRole  2 mg Oral QHS   Continuous Infusions:   LOS: 0 days    Time spent: 25 mins.More than 50% of that time was spent in counseling and/or coordination of care.      Shelly Coss, MD [facility] P8/25/2021, 11:20 AM

## 2019-10-11 NOTE — Consult Note (Addendum)
Referring Provider:  [facility]         Primary Care Physician:  Seward Carol, MD Primary Gastroenterologist:  Oretha Caprice, MD           We were asked to see this patient for:   GI bleed               ASSESSMENT /  PLAN    [name] is a 86 y.o. male PMH significant for, but not necessarily limited to, asthma, hyperlipidemia, diabetes, chronic systolic heart failure, CVA, hiatal hernia,  adenomatous colon polyps, diverticulosis recurrent lower GI bleeding on Plavix and presumed diverticular , history of Nissen fundoplication, splenectomy, abdominal hernia repair                                                                                                                               # Recurrent painless hematochezia ( with hypotension) on plavix.  --Multiple previous admissions for same.  --Bleeding presumed to be diverticular --Apparently the benefits of continuing plavix outweigh the risks which in his case has been recurrent GI bleeding.  --Patient hasn't been interested in Surgery and given advanced age this is understandable.  --No bleeding today but had a small amount of blood in last BM last night. Given advanced age and presenting hypotension we have asked the patient to stay another night for continued monitoring. He is agreeable.  --In the interim, no further GI workup planned as long as he doesn't bleed again  --Agree with clear liquids through today.    # Hx of CVA, on chronic plavix    HPI:    Chief Complaint: rectal bleeding  [name] is a 86 y.o. male with a history of multiple hospital admissions for recurrent lower GI bleeding felt to be diverticular in nature and required multiple blood transfusions.  In February of this year he had a major bleed requiring 5 units of blood.  He had a multitude test including nuclear bleeding scan, CT angiograms, colonoscopy and upper endoscopy.  The nuclear bleeding scan suggest possible right colon the source of  bleeding.  Meckel scan was negative.  At his last office follow-up visit in March of this year Dr. Ardis Hughs had an extensive conversation with the patient and his wife about the recurrent bleeding.  It was understood that he was not able to stop Plavix.  There was discussion about referral to surgeon but patient and wife declined.  [name] developed recurrent painless rectal bleeding at 2am yesterday morning. The blood was dark red with some some black content. The bleeding persisted so he came to ED. He felt okay without dizziness or SHOB. In the ED he had a large blood BM and then felt " a little out of it".  In ED he was hypotensive with BP in the 70's ( now improved). His hgb 13.4, recheck 1.5 hours later it was 12.1. His baseline hgb is hard to  discern given all the hospital admissions / GI bleeds but it was 9.1 in early May of this year. Given his history , EDP decided to transfuse him unit of blood. This am his hgb was actually done to 10.7.    PREVIOUS ENDOSCOPIC EVALUATIONS / GI STUDIES :  04/04/19 colonoscopy --Dark liquid stool, maybe melena -Extensive left colon diverticulosis. - His presentation has always seems to be from a lower GI source, presumed divertcular. This admission is similar with normal BUN/Cr however after this examination I has some doubt about him having a colon source of bleeding. Nuc med bleeding scans have varied twice (left colon, right colon) and the 2020 Nuc scan suggested "hyperemia in the LEFT mid abdomen either at small bowel or the distal descending colon". He has not had EGD in 10 years. I am going to order a Meckels scan today and if negative then will need to consider UGI workup.  04/05/19 EGD LA Grade A reflux esophagitis with no bleeding. - Benign-appearing esophageal stricture. - Moderate gastritis. Biopsied. - Medium-sized hiatal hernia without Cameron's erosions. - Normal examined duodenum. - The examination was otherwise normal.  04/05/19  Meckel's scan -No evidence of Meckel's diverticulum  Past Medical History:  Diagnosis Date  . Acute CVA (cerebrovascular accident) ([place]) 07/25/2015  . Asthma   . Benign essential HTN   . Cerebral infarction due to embolism of left middle cerebral artery ([place]) 02/03/2014  . Chronic combined systolic and diastolic CHF (congestive heart failure) ([place]) 12/23/2017  . Colitis   . Colon polyps   . Diabetes mellitus type 2, diet-controlled ([place]) 12/22/2013  . Diverticulosis   . GERD (gastroesophageal reflux disease)   . GI bleed   . Hemorrhoids   . Hiatal hernia   . History of esophageal strciture   . HLD (hyperlipidemia)   . Osteoarthritis   . Proctitis   . Status post dilation of esophageal narrowing   . Stroke [facility]     Past Surgical History:  Procedure Laterality Date  . ABDOMINAL HERNIA REPAIR    . BIOPSY  04/05/2019   Procedure: BIOPSY;  Surgeon: Thornton Schoening, MD;  Location: [place];  Service: Gastroenterology;;  . COLONOSCOPY     multiple  . COLONOSCOPY WITH PROPOFOL N/A 12/12/2016   Procedure: COLONOSCOPY WITH PROPOFOL;  Surgeon: Gatha Mayer, MD;  Location: [facility] ENDOSCOPY;  Service: Endoscopy;  Laterality: N/A;  . COLONOSCOPY WITH PROPOFOL N/A 04/04/2019   Procedure: COLONOSCOPY WITH PROPOFOL;  Surgeon: Milus Banister, MD;  Location[facility]COPY;  Service: Endoscopy;  Laterality: N/A;  . ESOPHAGOGASTRODUODENOSCOPY     multiple  . ESOPHAGOGASTRODUODENOSCOPY (EGD) WITH PROPOFOL N/A 04/05/2019   Procedure: ESOPHAGOGASTRODUODENOSCOPY (EGD) WITH PROPOFOL;  Surgeon: Thornton Callas, MD;  Location: [place];  Service: Gastroenterology;  Laterality: N/A;  . INSERTION OF MESH  01/29/2012   Procedure: INSERTION OF MESH;  Surgeon: Gwenyth Ober, MD;  Location: [place];  Service: General;  Laterality: N/A;  . IR ANGIOGRAM SELECTIVE EACH ADDITIONAL VESSEL  04/01/2019  . IR ANGIOGRAM VISCERAL SELECTIVE  04/01/2019  . IR ANGIOGRAM VISCERAL SELECTIVE  04/01/2019  . IR ANGIOGRAM  VISCERAL SELECTIVE  04/01/2019  . IR US GUIDE VASC ACCESS RIGHT  04/01/2019  . NISSEN FUNDOPLICATION    . SPLENECTOMY, TOTAL     nontraumatic rupture  . VENTRAL HERNIA REPAIR  01/29/2012    WITH MESH  . VENTRAL HERNIA REPAIR  01/29/2012   Procedure: HERNIA REPAIR VENTRAL ADULT;  Surgeon: Gwenyth Ober, MD;  Location:  [place] [facility];  Service: General;  Laterality: N/A;  open recurrent ventral hernia repair with mesh    Prior to Admission medications   Medication Sig Start Date End Date Taking? Authorizing Provider  albuterol (PROAIR HFA) 108 (90 Base) MCG/ACT inhaler Inhale 2 puffs into the lungs every 6 (six) hours as needed for wheezing or shortness of breath.   Yes [provider]  albuterol (PROVENTIL) (2.5 MG/3ML) 0.083% nebulizer solution Take 3 mLs by nebulization every 6 (six) hours as needed for wheezing or shortness of breath.  10/04/18  Yes [provider]  budesonide (PULMICORT) 0.5 MG/2ML nebulizer solution Take 2 mLs by nebulization 2 (two) times daily. 09/01/18  Yes [provider]  carvedilol (COREG) 3.125 MG tablet Take 1 tablet (3.125 mg total) by mouth 2 (two) times daily with a meal. 02/26/16  Yes Amin, Ankit Chirag, MD  clopidogrel (PLAVIX) 75 MG tablet Take 1 tablet (75 mg total) by mouth daily. 06/25/19  Yes Charlynne Cousins, MD  diphenhydramine-acetaminophen (TYLENOL PM) 25-500 MG TABS tablet Take 1 tablet by mouth at bedtime.    Yes [provider]  ferrous sulfate 325 (65 FE) MG tablet Take 1 tablet (325 mg total) by mouth daily. 05/10/19  Yes Milus Banister, MD  fluticasone [facility]) 50 MCG/ACT nasal spray Place 1 spray into both nostrils daily as needed for allergies or rhinitis.  06/18/14  Yes [provider]  Melatonin 10 MG TABS Take 10 mg by mouth at bedtime as needed (for sleep).    Yes [provider]  metFORMIN (GLUCOPHAGE) 500 MG tablet Take 500 mg by mouth daily. 08/23/19  Yes [provider]  montelukast  (SINGULAIR) 10 MG tablet Take 10 mg by mouth at bedtime as needed (for breathing flares).    Yes [provider]  polyethylene glycol (MIRALAX / GLYCOLAX) packet Take 17 g by mouth daily as needed for mild constipation ([employer]).    Yes [provider]  predniSONE (DELTASONE) 10 MG tablet Take 10 mg by mouth daily. 07/11/19  Yes [provider]  psyllium (METAMUCIL) 58.6 % powder Take 1 packet by mouth daily as needed (for constipation- MIX AND DRINK).    Yes [provider]  rOPINIRole (REQUIP) 1 MG tablet Take 2 mg by mouth at bedtime. 02/18/19  Yes [provider]  simvastatin (ZOCOR) 40 MG tablet Take 1 tablet (40 mg total) by mouth daily. Patient taking differently: Take 40 mg by mouth at bedtime.  02/26/16  Yes Amin, Ankit Chirag, MD  traMADol (ULTRAM) 50 MG tablet Take 50 mg by mouth as needed for pain. 09/19/19  Yes [provider]  Alpha-D-Galactosidase (GAS-X PREVENTION) CAPS Take 1 capsule by mouth 2 (two) times daily with a meal. Patient not taking: Reported on 10/10/2019 05/10/19   Milus Banister, MD  pantoprazole (PROTONIX) 40 MG tablet Take 1 tablet (40 mg total) by mouth 2 (two) times daily. Patient not taking: Reported on 10/10/2019 04/05/19 10/10/19  Harold Hedge, MD    Current Facility-Administered Medications  Medication Dose Route Frequency Provider Last Rate Last Admin  . 0.9 %  sodium chloride infusion   Intravenous Continuous Shelly Coss, MD 75 mL/hr at 10/11/19 0813 Rate Change at 10/11/19 0813  . albuterol (PROVENTIL) (2.5 MG/3ML) 0.083% nebulizer solution 3 mL  3 mL Inhalation Q6H PRN Adhikari, Amrit, MD      . albuterol (PROVENTIL) (2.5 MG/3ML) 0.083% nebulizer solution 3 mL  3 mL Nebulization Q6H PRN Adhikari,  Amrit, MD      . budesonide (PULMICORT) nebulizer solution 0.5 mg  2 mL Nebulization BID Shelly Coss, MD   0.5 mg at 10/11/19 8338  . ferrous sulfate tablet 325 mg  325 mg Oral Daily Shelly Coss,  MD   325 mg at 10/10/19 1955  . fluticasone (FLONASE) 50 MCG/ACT nasal spray 1 spray  1 spray Each Nare Daily PRN Adhikari, Amrit, MD      . insulin aspart (novoLOG) injection 0-9 Units  0-9 Units Subcutaneous TID WC Adhikari, Amrit, MD      . melatonin tablet 10 mg  10 mg Oral QHS PRN Shelly Coss, MD   10 mg at 10/10/19 2136  . montelukast (SINGULAIR) tablet 10 mg  10 mg Oral QHS PRN Shelly Coss, MD      . pantoprazole (PROTONIX) injection 40 mg  40 mg Intravenous Q12H Shelly Coss, MD   40 mg at 10/10/19 2139  . rOPINIRole (REQUIP) tablet 2 mg  2 mg Oral QHS Shelly Coss, MD   2 mg at 10/10/19 2137    Allergies as of 10/10/2019 - Review Complete 10/10/2019  Allergen Reaction Noted  . Tape Other (See Comments) 02/04/2018  . Azithromycin Rash and Other (See Comments) 03/08/2015    Family History  Problem Relation Age of Onset  . Alzheimer's disease Mother   . Breast cancer Mother   . Throat cancer Father   . Colon cancer Neg Hx   . Colon polyps Neg Hx   . Pancreatic cancer Neg Hx   . Stomach cancer Neg Hx   . Liver disease Neg Hx     Social History   Socioeconomic History  . Marital status: Married    Spouse name: [name]  . Number of children: 2  . Years of education: Bachelor's  . Highest education level: Not on file  Occupational History  . Occupation: retired  Tobacco Use  . Smoking status: Never Smoker  . Smokeless tobacco: Never Used  Substance and Sexual Activity  . Alcohol use: Yes    Alcohol/week: 2.0 standard drinks    Types: 2 Glasses of wine per week    Comment: daily  . Drug use: No  . Sexual activity: Not on file  Other Topics Concern  . Not on file  Social History Narrative   Patient is married and has 2 children.   Patient is right handed.   Patient has Bachelor's degree.   Patient drinks 2 cups daily.   Social Determinants of Health   Financial Resource Strain:   . Difficulty of Paying Living Expenses: Not on file  Food  Insecurity:   . Worried About Charity fundraiser in the Last Year: Not on file  . Ran Out of Food in the Last Year: Not on file  Transportation Needs:   . Lack of Transportation (Medical): Not on file  . Lack of Transportation (Non-Medical): Not on file  Physical Activity:   . Days of Exercise per Week: Not on file  . Minutes of Exercise per Session: Not on file  Stress:   . Feeling of Stress : Not on file  Social Connections:   . Frequency of Communication with Friends and Family: Not on file  . Frequency of Social Gatherings with Friends and Family: Not on file  . Attends Religious Services: Not on file  . Active Member of Clubs or Organizations: Not on file  . Attends Archivist Meetings: Not on file  . Marital Status:  Not on file  Intimate Partner Violence:   . Fear of Current or Ex-Partner: Not on file  . Emotionally Abused: Not on file  . Physically Abused: Not on file  . Sexually Abused: Not on file    Review of Systems: All systems reviewed and negative except where noted in HPI.  Physical Exam: Vital signs in last 24 hours: Temp:  [97.8 F (36.6 C)-98.4 F (36.9 C)] 98.3 F (36.8 C) (08/25 0701) Pulse Rate:  [80-182] 88 (08/25 0701) Resp:  [12-26] 20 (08/25 0701) BP: (72-129)/(49-102) 111/68 (08/25 0701) SpO2:  [93 %-100 %] 93 % (08/25 0812) Weight:  [67.5 kg] 67.5 kg (08/25 0300) Last BM Date: 10/10/19 (per RN and paitent. ) General:   Alert, well-developed,  male in NAD Psych:  Pleasant, cooperative. Normal mood and affect. Eyes:  Pupils equal, sclera clear, no icterus.   Conjunctiva pink. Ears:  Normal auditory acuity. Nose:  No deformity, discharge,  or lesions. Neck:  Supple; no masses Lungs:  Clear throughout to auscultation.   No wheezes, crackles, or rhonchi.  Heart:  Regular rate and rhythm; no murmurs, no lower extremity edema Abdomen:  Soft, non-distended, nontender, BS active, no palp mass   Rectal:  Deferred  Msk:  Symmetrical without  gross deformities. . Neurologic:  Alert and  oriented x4;  grossly normal neurologically. Skin:  Intact without significant lesions or rashes.   Intake/Output from previous day: 08/24 0701 - 08/25 0700 In: 1469 [I.V.:469; IV Piggyback:1000] Out: 300 [Urine:300] Intake/Output this shift: No intake/output data recorded.  Lab Results: Recent Labs    10/10/19 1410 10/10/19 1410 10/10/19 1540 10/10/19 1540 10/10/19 2138 10/11/19 0147 10/11/19 0525  WBC 9.5  --  10.0  --   --   --  11.4*  HGB 13.4   < > 12.1*   < > 12.0* 10.4* 10.7*  HCT 41.6   < > 38.0*   < > 37.7* 32.3* 33.3*  PLT 309  --  315  --   --   --  208   < > = values in this interval not displayed.   BMET Recent Labs    10/10/19 1410 10/11/19 0525  NA 135 136  K 4.9 4.1  CL 101 105  CO2 26 23  GLUCOSE 164* 147*  BUN 18 27*  CREATININE 0.71 0.62  CALCIUM 8.6* 8.1*   LFT Recent Labs    10/10/19 1410  PROT 5.7*  ALBUMIN 3.1*  AST 13*  ALT 10  ALKPHOS 56  BILITOT 0.5   PT/INR No results for input(s): LABPROT, INR in the last 72 hours. Hepatitis Panel No results for input(s): HEPBSAG, HCVAB, HEPAIGM, HEPBIGM in the last 72 hours.   . CBC Latest Ref Rng & Units 10/11/2019 10/11/2019 10/10/2019  WBC 4.0 - 10.5 K/uL 11.4(H) - -  Hemoglobin 13.0 - 17.0 g/dL 10.7(L) 10.4(L) 12.0(L)  Hematocrit 39 - 52 % 33.3(L) 32.3(L) 37.7(L)  Platelets 150 - 400 K/uL 208 - -    . CMP Latest Ref Rng & Units 10/11/2019 10/10/2019 06/21/2019  Glucose 70 - 99 mg/dL 147(H) 164(H) 127(H)  BUN 8 - 23 mg/dL 27(H) 18 12  Creatinine 0.61 - 1.24 mg/dL 0.62 0.71 0.79  Sodium 135 - 145 mmol/L 136 135 137  Potassium 3.5 - 5.1 mmol/L 4.1 4.9 3.6  Chloride 98 - 111 mmol/L 105 101 104  CO2 22 - 32 mmol/L 23 26 27   Calcium 8.9 - 10.3 mg/dL 8.1(L) 8.6(L) 8.4(L)  Total Protein 6.5 -  8.1 g/dL - 5.7(L) -  Total Bilirubin 0.3 - 1.2 mg/dL - 0.5 -  Alkaline Phos 38 - 126 U/L - 56 -  AST 15 - 41 U/L - 13(L) -  ALT 0 - 44 U/L - 10 -    Studies/Results: No results found.  Principal Problem:   GI bleed Active Problems:   Diabetes mellitus type II, non insulin dependent (HCC)   HLD (hyperlipidemia)   Chronic anticoagulation 2/2 history of CVA   Chronic combined systolic and diastolic CHF (congestive heart failure) ([place])    Tye Savoy, NP-C @  10/11/2019, 9:54 AM   I have reviewed the entire case in detail with the above APP and discussed the plan in detail.  Therefore, I agree with the diagnoses recorded above. In addition,  I have personally interviewed and examined the patient.  My additional thoughts are as follows:  Recurrence of diverticular bleeding, obscure source.  Multiple prior episodes, always resolved with conservative management.  Have not been able to catch it on previous imaging to allow angiographic control. This episode seems to been relatively minor compared to some of his previous hospitalizations.  Hemoglobin dropped after transfusion and remained stable on a subsequent check.  He has not passed blood since last evening. [name] was hoping very much to go home today, but his [facility] physician and I both convinced him it would be wise to monitor him in hospital another day given the risk of rebleeding in the first 48 hours. He was ultimately agreeable and pleasant as always and thankful for his care.  Full liquid diet today.  If stable tomorrow with no recurrence of bleeding, discharge home and resume Plavix in 5 days.  [name] Office:[phone]

## 2019-10-12 ENCOUNTER — Inpatient Hospital Stay (HOSPITAL_COMMUNITY): Payer: Medicare Other

## 2019-10-12 DIAGNOSIS — Z7901 Long term (current) use of anticoagulants: Secondary | ICD-10-CM

## 2019-10-12 LAB — CBC WITH DIFFERENTIAL/PLATELET
Abs Immature Granulocytes: 0.07 10*3/uL (ref 0.00–0.07)
Basophils Absolute: 0.1 10*3/uL (ref 0.0–0.1)
Basophils Relative: 1 %
Eosinophils Absolute: 0.2 10*3/uL (ref 0.0–0.5)
Eosinophils Relative: 2 %
HCT: 25.6 % — ABNORMAL LOW (ref 39.0–52.0)
Hemoglobin: 8.1 g/dL — ABNORMAL LOW (ref 13.0–17.0)
Immature Granulocytes: 1 %
Lymphocytes Relative: 13 %
Lymphs Abs: 1.6 10*3/uL (ref 0.7–4.0)
MCH: 31.2 pg (ref 26.0–34.0)
MCHC: 31.6 g/dL (ref 30.0–36.0)
MCV: 98.5 fL (ref 80.0–100.0)
Monocytes Absolute: 1.2 10*3/uL — ABNORMAL HIGH (ref 0.1–1.0)
Monocytes Relative: 10 %
Neutro Abs: 8.6 10*3/uL — ABNORMAL HIGH (ref 1.7–7.7)
Neutrophils Relative %: 73 %
Platelets: 212 10*3/uL (ref 150–400)
RBC: 2.6 MIL/uL — ABNORMAL LOW (ref 4.22–5.81)
RDW: 15.3 % (ref 11.5–15.5)
WBC: 11.8 10*3/uL — ABNORMAL HIGH (ref 4.0–10.5)
nRBC: 0 % (ref 0.0–0.2)

## 2019-10-12 LAB — GLUCOSE, CAPILLARY
Glucose-Capillary: 141 mg/dL — ABNORMAL HIGH (ref 70–99)
Glucose-Capillary: 139 mg/dL — ABNORMAL HIGH (ref 70–99)
Glucose-Capillary: 121 mg/dL — ABNORMAL HIGH (ref 70–99)
Glucose-Capillary: 122 mg/dL — ABNORMAL HIGH (ref 70–99)

## 2019-10-12 LAB — HEMOGLOBIN AND HEMATOCRIT, BLOOD
HCT: 23 % — ABNORMAL LOW (ref 39.0–52.0)
HCT: 24.3 % — ABNORMAL LOW (ref 39.0–52.0)
Hemoglobin: 7.5 g/dL — ABNORMAL LOW (ref 13.0–17.0)
Hemoglobin: 8.1 g/dL — ABNORMAL LOW (ref 13.0–17.0)

## 2019-10-12 LAB — PREPARE RBC (CROSSMATCH)

## 2019-10-12 MED ORDER — SODIUM CHLORIDE 0.9 % IV SOLN: INTRAVENOUS | Status: DC

## 2019-10-12 MED ORDER — SODIUM CHLORIDE 0.9 % IV BOLUS
500.0000 mL | Freq: Once | INTRAVENOUS | Status: AC
  Administered 2019-10-12: 500 mL via INTRAVENOUS

## 2019-10-12 MED ORDER — PANTOPRAZOLE SODIUM 40 MG PO TBEC
40.0000 mg | DELAYED_RELEASE_TABLET | Freq: Two times a day (BID) | ORAL | Status: DC
  Administered 2019-10-12 – 2019-10-15 (×7): 40 mg via ORAL
  Filled 2019-10-12 (×7): qty 1

## 2019-10-12 MED ORDER — SODIUM CHLORIDE (PF) 0.9 % IJ SOLN
INTRAMUSCULAR | Status: AC
  Filled 2019-10-12: qty 50

## 2019-10-12 MED ORDER — IOHEXOL 350 MG/ML SOLN
100.0000 mL | Freq: Once | INTRAVENOUS | Status: AC | PRN
  Administered 2019-10-12: 100 mL via INTRAVENOUS

## 2019-10-12 MED ORDER — SODIUM CHLORIDE 0.9% IV SOLUTION: Freq: Once | INTRAVENOUS | Status: AC

## 2019-10-12 NOTE — Progress Notes (Signed)
CTA done, report pending  Hgb decreased to 7.5, so transfusion planned.  I saw him - he looks well and has not had another bloody BM since early this AM.

## 2019-10-12 NOTE — Progress Notes (Signed)
   10/12/19 0629  Assess: MEWS Score  BP 94/70  Pulse Rate (!) 111  SpO2 100 %  O2 Device Room Air  Assess: MEWS Score  MEWS Temp 0  MEWS Systolic 1  MEWS Pulse 2  MEWS RR 0  MEWS LOC 0  MEWS Score 3  MEWS Score Color Yellow  Assess: if the MEWS score is Yellow or Red  Were vital signs taken at a resting state? Yes  Focused Assessment Change from prior assessment (see assessment flowsheet) (bloody BM)  Early Detection of Sepsis Score *See Row Information* Low  MEWS guidelines implemented *See Row Information* Yes  Treat  MEWS Interventions Escalated (See documentation below)  Take Vital Signs  Increase Vital Sign Frequency  Yellow: Q 2hr X 2 then Q 4hr X 2, if remains yellow, continue Q 4hrs  Escalate  MEWS: Escalate Yellow: discuss with charge nurse/RN and consider discussing with provider and RRT  Notify: Provider  Provider Name/Title Sharlet Salina, NP  Response See new orders   Pt had bowel movement that was 99% blood filling entire large bedpan. RN rechecked vitals and notified on call hospitalist. RN called lab to verify that scheduled AM CBC would be drawn ASAP. See new orders for fluid bolus, awaiting CBC results and continue to monitor pt, following yellow mews protocol.

## 2019-10-12 NOTE — Progress Notes (Signed)
[facility] Progress Note  Chief Complaint: Hematochezia  History:  [name] had a large bloody bowel movement about 2 and half to 3 hours ago and his hemoglobin is significantly decreased this morning.  He remains tachycardic.  I saw him along with his hospitalist, and fortunately he feels well and looks fairly well.  He denies chest pain or dyspnea or abdominal pain.  He has abdominal "rumbling", like he might have another bowel movement soon.   Objective:   Current Facility-Administered Medications:  .  (feeding supplement) PROSource Plus liquid 30 mL, 30 mL, Oral, BID BM, Adhikari, Amrit, MD, 30 mL at 10/11/19 2049 .  0.9 %  sodium chloride infusion, , Intravenous, Continuous, Adhikari, Amrit, MD .  albuterol (PROVENTIL) (2.5 MG/3ML) 0.083% nebulizer solution 3 mL, 3 mL, Inhalation, Q6H PRN, Adhikari, Amrit, MD .  albuterol (PROVENTIL) (2.5 MG/3ML) 0.083% nebulizer solution 3 mL, 3 mL, Nebulization, Q6H PRN, Adhikari, Amrit, MD .  budesonide (PULMICORT) nebulizer solution 0.5 mg, 2 mL, Nebulization, BID, Adhikari, Amrit, MD, 0.5 mg at 10/12/19 0825 .  ferrous sulfate tablet 325 mg, 325 mg, Oral, Daily, Adhikari, Amrit, MD, 325 mg at 10/11/19 1102 .  fluticasone (FLONASE) 50 MCG/ACT nasal spray 1 spray, 1 spray, Each Nare, Daily PRN, Adhikari, Amrit, MD .  insulin aspart (novoLOG) injection 0-9 Units, 0-9 Units, Subcutaneous, TID WC, Shelly Coss, MD, 1 Units at 10/12/19 0753 .  melatonin tablet 10 mg, 10 mg, Oral, QHS PRN, Shelly Coss, MD, 10 mg at 10/11/19 2051 .  montelukast (SINGULAIR) tablet 10 mg, 10 mg, Oral, QHS PRN, Tawanna Solo, Amrit, MD .  multivitamin with minerals tablet 1 tablet, 1 tablet, Oral, Daily, Shelly Coss, MD, 1 tablet at 10/11/19 2050 .  pantoprazole (PROTONIX) EC tablet 40 mg, 40 mg, Oral, BID, Arlyn Dunning M, RPH .  rOPINIRole (REQUIP) tablet 2 mg, 2 mg, Oral, QHS, Adhikari, Amrit, MD, 2 mg at 10/11/19 2051  . sodium chloride       Vital signs in  last 24 hrs: Vitals:   10/12/19 0800 10/12/19 0825  BP: (!) 100/58   Pulse: (!) 101   Resp: 19   Temp: (!) 97.4 F (36.3 C)   SpO2: 95% 96%    Intake/Output Summary (Last 24 hours) at 10/12/2019 0924 Last data filed at 10/11/2019 2045 Gross per 24 hour  Intake 300 ml  Output 1400 ml  Net -1100 ml   Pulse 110  Physical Exam Pale, alert and conversational, no distress  HEENT: sclera anicteric, oral mucosa without lesions  Neck: supple, no thyromegaly, JVD or lymphadenopathy  Cardiac: RRR without murmurs, S1S2 heard, no peripheral edema  Pulm: clear to auscultation bilaterally, normal RR and effort noted  Abdomen: soft, no tenderness, with active bowel sounds. No guarding or palpable hepatosplenomegaly  Skin; warm and dry, no jaundice  Recent Labs:  CBC Latest Ref Rng & Units 10/12/2019 10/11/2019 10/11/2019  WBC 4.0 - 10.5 K/uL 11.8(H) - 11.4(H)  Hemoglobin 13.0 - 17.0 g/dL 8.1(L) 10.4(L) 10.7(L)  Hematocrit 39 - 52 % 25.6(L) 32.0(L) 33.3(L)  Platelets 150 - 400 K/uL 212 - 208    No results for input(s): INR in the last 168 hours. CMP Latest Ref Rng & Units 10/11/2019 10/10/2019 06/21/2019  Glucose 70 - 99 mg/dL 147(H) 164(H) 127(H)  BUN 8 - 23 mg/dL 27(H) 18 12  Creatinine 0.61 - 1.24 mg/dL 0.62 0.71 0.79  Sodium 135 - 145 mmol/L 136 135 137  Potassium 3.5 - 5.1 mmol/L 4.1 4.9 3.6  Chloride 98 - 111 mmol/L 105 101 104  CO2 22 - 32 mmol/L 23 26 27   Calcium 8.9 - 10.3 mg/dL 8.1(L) 8.6(L) 8.4(L)  Total Protein 6.5 - 8.1 g/dL - 5.7(L) -  Total Bilirubin 0.3 - 1.2 mg/dL - 0.5 -  Alkaline Phos 38 - 126 U/L - 56 -  AST 15 - 41 U/L - 13(L) -  ALT 0 - 44 U/L - 10 -   Assessment & Plan  Assessment: Hematochezia Acute blood loss anemia Colonic diverticulosis, suspected to be source of bleeding.  Multiple prior episodes and hospitalizations for same. Therapeutic on Plavix   Plan: Stat CT angiogram Every 8 hour CBC N.p.o. for now  Total time 35 minutes [name]  III Office: [phone]

## 2019-10-12 NOTE — Progress Notes (Signed)
PROGRESS NOTE    DORIN STOOKSBURY  FMB:[id number] DOB: 10/18/1931 DOA: 10/10/2019 PCP: Seward Carol, MD   Brief Narrative:  [name] is a 86 y.o. male with medical history significant of nonhemorrhagic CVA, hypertension, congestive heart failure with EF of 45%, type 2 diabetes mellitus, recurrent GI bleed and following with [name] presented to the emergency department with multiple episodes of bloody bowel movement.Last time he was admitted on May of this year with similar complaint.  At that time no intervention was done and he was transfused with a unit of PRBC.  Before that, he was admitted on March of this year. He underwent a full work-up on March with colonoscopy, EGD without finding of clear source of bleeding.  He also underwent CT angiogram and bleeding scan, the later suggested possible right colonic source.He had known history of diverticulosis so it was presumed the cause of GI bleed.  After a long discussion with his gastroenterologist( Dr Ardis Hughs), no further intervention in the future was planned. On presentation he was hemodynamically stable.  His hemoglobin was stable in the range of 13 on presentation. GI is following.  He again had a big bloody bowel movement this morning and his hemoglobin dropped to the range of 8.  Plan is to do CT angiogram.  Assessment & Plan:   Principal Problem:   GI bleed Active Problems:   Diabetes mellitus type II, non insulin dependent (HCC)   HLD (hyperlipidemia)   Chronic anticoagulation 2/2 history of CVA   Chronic combined systolic and diastolic CHF (congestive heart failure) (HCC)   Acute on chronic GI bleed: Presented with  bloody bowels at home, had another big bloody bowel movement in the emergency department.    Became diaphoretic after the bloody bowel movement in the emergency department and was given IV fluids and unit of PRBC.  We will continue to monitor H&H.  GI consulted.  Patient prefers not to do any intervention on this  admission. Last time he was admitted on March  of this year with similar complaint.  He had known history of diverticulosis so it was presumed the cause of GI bleed.  He underwent a full work-up on last admission with colonoscopy, EGD without finding of clear source of bleeding.  He also underwent CT angiogram and bleeding scan, the latter suggested possible right colonic source. He was again admitted on May 2021 for similar complaints at the time work-up was not done but was transfused with a unit of PRBC and was discharged to home. This could be  from diverticular bleed but we have started on Protonix 40 mg IV twice daily for now.   GI is following.  He again had a big bloody bowel movement this morning .  Hemoglobin dropped to the range of 7.5.  Plan is to do CT angiogram.Will keep him NPO We will transfuse him with a unit of PRBC.  Nonhemorrhagic CVA: He said he has  left-sided residual weakness.  Ambulates with the help of walker.  Takes Plavix for the history of stroke.  Plavix on hold.  Chronic systolic congestive heart failure: Last echocardiogram showed ejection fraction 45%.  Euvolemic on presentation.  Continue gentle IV fluids Does not take any diuretics at home.  Diabetes type 2: Continue sliding scale insulin for now.  Continue to monitor CBGs.  History of restless leg syndrome: On ropinirole.  Hypertension:  Blood pressure is well controlled.  HLD: Takes  simvastatin     Nutrition Problem: Inadequate  protein intake Etiology: other (see comment) (current diet order)      DVT prophylaxis: SCD Code Status: DNR Family Communication: Discussed with wife on phone on 10/12/2019. Status is: Inpatient   Dispo: The patient is from: Home              Anticipated d/c is to: Home              Anticipated d/c date is: 1-2 days              Patient currently is not medically stable to d/c.  Plan is to do CT angiogram.  Patient having active GI bleed   Consultants:  GI  Procedures:None  Antimicrobials:  Anti-infectives (From admission, onward)   None      Subjective: Patient seen and examined the bedside this morning.  Hemodynamically stable but had a big bloody bowel movement this morning resulting in drop in hemoglobin.  Plan is to discharge angiogram.  Kept n.p.o.  Denies any abdominal pain.  Objective: Vitals:   10/12/19 0629 10/12/19 0800 10/12/19 0825 10/12/19 1004  BP: 94/70 (!) 100/58  98/61  Pulse: (!) 111 (!) 101  (!) 104  Resp:  19  20  Temp:  (!) 97.4 F (36.3 C)  (!) 97.5 F (36.4 C)  TempSrc:  Oral  Oral  SpO2: 100% 95% 96% 98%  Weight:      Height:        Intake/Output Summary (Last 24 hours) at 10/12/2019 1255 Last data filed at 10/12/2019 1019 Gross per 24 hour  Intake 540 ml  Output 1325 ml  Net -785 ml   Filed Weights   10/11/19 0300  Weight: 67.5 kg    Examination:   General exam: Pleasant elderly gentleman, not in distress Respiratory system: Bilateral equal air entry, normal vesicular breath sounds, no wheezes or crackles  Cardiovascular system: S1 & S2 heard, RRR. No JVD, murmurs, rubs, gallops or clicks. Gastrointestinal system: Abdomen is nondistended, soft and nontender. No organomegaly or masses felt. Normal bowel sounds heard. Central nervous system: Alert and oriented. No focal neurological deficits. Extremities: No edema, no clubbing ,no cyanosis, distal peripheral pulses palpable. Skin: No rashes, lesions or ulcers,no icterus ,no pallor    Data Reviewed: I have personally reviewed following labs and imaging studies  CBC: Recent Labs  Lab 10/10/19 1410 10/10/19 1410 10/10/19 1540 10/10/19 2138 10/11/19 0147 10/11/19 0525 10/11/19 0948 10/12/19 0659 10/12/19 1218  WBC 9.5  --  10.0  --   --  11.4*  --  11.8*  --   NEUTROABS 7.2  --  7.0  --   --   --   --  8.6*  --   HGB 13.4   < > 12.1*   < > 10.4* 10.7* 10.4* 8.1* 7.5*  HCT 41.6   < > 38.0*   < > 32.3* 33.3* 32.0* 25.6* 23.0*   MCV 97.9  --  97.7  --   --  97.9  --  98.5  --   PLT 309  --  315  --   --  208  --  212  --    < > = values in this interval not displayed.   Basic Metabolic Panel: Recent Labs  Lab 10/10/19 1410 10/11/19 0525  NA 135 136  K 4.9 4.1  CL 101 105  CO2 26 23  GLUCOSE 164* 147*  BUN 18 27*  CREATININE 0.71 0.62  CALCIUM 8.6* 8.1*   GFR: Estimated  Creatinine Clearance: 63.3 mL/min (by C-G formula based on SCr of 0.62 mg/dL). Liver Function Tests: Recent Labs  Lab 10/10/19 1410  AST 13*  ALT 10  ALKPHOS 56  BILITOT 0.5  PROT 5.7*  ALBUMIN 3.1*   Recent Labs  Lab 10/10/19 1410  LIPASE 20   No results for input(s): AMMONIA in the last 168 hours. Coagulation Profile: No results for input(s): INR, PROTIME in the last 168 hours. Cardiac Enzymes: No results for input(s): CKTOTAL, CKMB, CKMBINDEX, TROPONINI in the last 168 hours. BNP (last 3 results) No results for input(s): PROBNP in the last 8760 hours. HbA1C: No results for input(s): HGBA1C in the last 72 hours. CBG: Recent Labs  Lab 10/11/19 1137 10/11/19 1649 10/11/19 2221 10/12/19 0723 10/12/19 1108  GLUCAP 124* 93 105* 141* 139*   Lipid Profile: No results for input(s): CHOL, HDL, LDLCALC, TRIG, CHOLHDL, LDLDIRECT in the last 72 hours. Thyroid Function Tests: No results for input(s): TSH, T4TOTAL, FREET4, T3FREE, THYROIDAB in the last 72 hours. Anemia Panel: No results for input(s): VITAMINB12, FOLATE, FERRITIN, TIBC, IRON, RETICCTPCT in the last 72 hours. Sepsis Labs: Recent Labs  Lab 10/10/19 1410  LATICACIDVEN 1.9    Recent Results (from the past 240 hour(s))  SARS Coronavirus 2 by RT PCR (hospital order, performed in [facility] hospital lab) Nasopharyngeal Nasopharyngeal Swab     Status: None   Collection Time: 10/10/19  5:38 PM   Specimen: Nasopharyngeal Swab  Result Value Ref Range Status   SARS Coronavirus 2 NEGATIVE NEGATIVE Final    Comment: (NOTE) SARS-CoV-2 target nucleic acids are  NOT DETECTED.  The SARS-CoV-2 RNA is generally detectable in upper and lower respiratory specimens during the acute phase of infection. The lowest concentration of SARS-CoV-2 viral copies this assay can detect is 250 copies / mL. A negative result does not preclude SARS-CoV-2 infection and should not be used as the sole basis for treatment or other patient management decisions.  A negative result may occur with improper specimen collection / handling, submission of specimen other than nasopharyngeal swab, presence of viral mutation(s) within the areas targeted by this assay, and inadequate number of viral copies (<250 copies / mL). A negative result must be combined with clinical observations, patient history, and epidemiological information.  Fact Sheet for Patients:   StrictlyIdeas.no  Fact Sheet for Healthcare Providers: BankingDealers.co.za  This test is not yet approved or  cleared by the [place] FDA and has been authorized for detection and/or diagnosis of SARS-CoV-2 by FDA under an Emergency Use Authorization (EUA).  This EUA will remain in effect (meaning this test can be used) for the duration of the COVID-19 declaration under Section 564(b)(1) of the Act, 21 U.S.C. section 360bbb-3(b)(1), unless the authorization is terminated or revoked sooner.  Performed at [facility], [place] [address], [place], [place] [address]          Radiology Studies: No results found.      Scheduled Meds: . (feeding supplement) PROSource Plus  30 mL Oral BID BM  . budesonide  2 mL Nebulization BID  . ferrous sulfate  325 mg Oral Daily  . insulin aspart  0-9 Units Subcutaneous TID WC  . multivitamin with minerals  1 tablet Oral Daily  . pantoprazole  40 mg Oral BID  . rOPINIRole  2 mg Oral QHS   Continuous Infusions: . sodium chloride 75 mL/hr at 10/12/19 1043     LOS: 1 day    Time spent: 25 mins.More  than 50% of that time was spent in counseling and/or coordination of care.      Shelly Coss, MD [facility] P8/26/2021, 12:55 PM

## 2019-10-13 DIAGNOSIS — K5791 Diverticulosis of intestine, part unspecified, without perforation or abscess with bleeding: Secondary | ICD-10-CM

## 2019-10-13 LAB — HEMOGLOBIN AND HEMATOCRIT, BLOOD
HCT: 25.1 % — ABNORMAL LOW (ref 39.0–52.0)
Hemoglobin: 8.3 g/dL — ABNORMAL LOW (ref 13.0–17.0)

## 2019-10-13 LAB — CBC WITH DIFFERENTIAL/PLATELET
Abs Immature Granulocytes: 0.03 10*3/uL (ref 0.00–0.07)
Basophils Absolute: 0.1 10*3/uL (ref 0.0–0.1)
Basophils Relative: 1 %
Eosinophils Absolute: 0.2 10*3/uL (ref 0.0–0.5)
Eosinophils Relative: 3 %
HCT: 22.3 % — ABNORMAL LOW (ref 39.0–52.0)
Hemoglobin: 7.3 g/dL — ABNORMAL LOW (ref 13.0–17.0)
Immature Granulocytes: 0 %
Lymphocytes Relative: 25 %
Lymphs Abs: 2.1 10*3/uL (ref 0.7–4.0)
MCH: 31.9 pg (ref 26.0–34.0)
MCHC: 32.7 g/dL (ref 30.0–36.0)
MCV: 97.4 fL (ref 80.0–100.0)
Monocytes Absolute: 1.2 10*3/uL — ABNORMAL HIGH (ref 0.1–1.0)
Monocytes Relative: 14 %
Neutro Abs: 5.1 10*3/uL (ref 1.7–7.7)
Neutrophils Relative %: 57 %
Platelets: 211 10*3/uL (ref 150–400)
RBC: 2.29 MIL/uL — ABNORMAL LOW (ref 4.22–5.81)
RDW: 15.6 % — ABNORMAL HIGH (ref 11.5–15.5)
WBC: 8.7 10*3/uL (ref 4.0–10.5)
nRBC: 0.2 % (ref 0.0–0.2)

## 2019-10-13 LAB — BASIC METABOLIC PANEL
Anion gap: 5 (ref 5–15)
BUN: 32 mg/dL — ABNORMAL HIGH (ref 8–23)
CO2: 24 mmol/L (ref 22–32)
Calcium: 7.6 mg/dL — ABNORMAL LOW (ref 8.9–10.3)
Chloride: 109 mmol/L (ref 98–111)
Creatinine, Ser: 0.67 mg/dL (ref 0.61–1.24)
GFR calc Af Amer: >60 mL/min (ref >60)
GFR calc non Af Amer: >60 mL/min (ref >60)
Glucose, Bld: 137 mg/dL — ABNORMAL HIGH (ref 70–99)
Potassium: 3.4 mmol/L — ABNORMAL LOW (ref 3.5–5.1)
Sodium: 138 mmol/L (ref 135–145)

## 2019-10-13 LAB — GLUCOSE, CAPILLARY
Glucose-Capillary: 111 mg/dL — ABNORMAL HIGH (ref 70–99)
Glucose-Capillary: 150 mg/dL — ABNORMAL HIGH (ref 70–99)
Glucose-Capillary: 116 mg/dL — ABNORMAL HIGH (ref 70–99)
Glucose-Capillary: 106 mg/dL — ABNORMAL HIGH (ref 70–99)

## 2019-10-13 MED ORDER — POTASSIUM CHLORIDE 20 MEQ PO PACK
40.0000 meq | PACK | Freq: Once | ORAL | Status: AC
  Administered 2019-10-13: 40 meq via ORAL
  Filled 2019-10-13: qty 2

## 2019-10-13 NOTE — Progress Notes (Signed)
Physical Therapy Treatment Patient Details Name: [name] MRN: [id number] DOB: 10/03/1931 Today's Date: 10/13/2019    History of Present Illness 86 y.o. male with medical history significant of nonhemorrhagic CVA (residual left sided deficits), hypertension, congestive heart failure with EF of 45%, type 2 diabetes mellitus, recurrent GI bleed and presented to the emergency department with multiple episodes of bloody bowel movement and admitted for GI bleed    PT Comments    Pt assisted with ambulating in hallway.  Pt reports feeling better today and eager to d/c home as soon as possible.    Follow Up Recommendations  No PT follow up     Equipment Recommendations  None recommended by PT    Recommendations for Other Services       Precautions / Restrictions Precautions Precautions: Fall    Mobility  Bed Mobility Overal bed mobility: Needs Assistance Bed Mobility: Sit to Supine       Sit to supine: Supervision;HOB elevated      Transfers Overall transfer level: Needs assistance Equipment used: Rolling walker (2 wheeled) Transfers: Sit to/from Stand Sit to Stand: Min guard         General transfer comment: min/guard for safety, increased time and effort  Ambulation/Gait Ambulation/Gait assistance: Min guard Gait Distance (Feet): 120 Feet Assistive device: Rolling walker (2 wheeled) Gait Pattern/deviations: Step-through pattern;Decreased stride length;Decreased dorsiflexion - left     General Gait Details: donned shoes prior to ambulation however did not have L AFO so he did have gait compensations however steady with RW (was wearing L knee brace)   Stairs             Wheelchair Mobility    Modified Rankin (Stroke Patients Only)       Balance                                            Cognition Arousal/Alertness: Awake/alert Behavior During Therapy: WFL for tasks assessed/performed Overall Cognitive Status: Within  Functional Limits for tasks assessed                                        Exercises      General Comments        Pertinent Vitals/Pain Pain Assessment: No/denies pain    Home Living                      Prior Function            PT Goals (current goals can now be found in the care plan section) Progress towards PT goals: Progressing toward goals    Frequency    Min 3X/week      PT Plan Current plan remains appropriate    Co-evaluation              AM-PAC PT "6 Clicks" Mobility   Outcome Measure  Help needed turning from your back to your side while in a flat bed without using bedrails?: None Help needed moving from lying on your back to sitting on the side of a flat bed without using bedrails?: None Help needed moving to and from a bed to a chair (including a wheelchair)?: A Little Help needed standing up from a chair using your arms (e.g., wheelchair or  bedside chair)?: A Little Help needed to walk in hospital room?: A Little Help needed climbing 3-5 steps with a railing? : A Lot 6 Click Score: 19    End of Session Equipment Utilized During Treatment: Gait belt Activity Tolerance: Patient tolerated treatment well Patient left: with call bell/phone within reach;with bed alarm set;in bed;with family/visitor present Nurse Communication: Mobility status PT Visit Diagnosis: Difficulty in walking, not elsewhere classified (R26.2)     Time: 8242-3536 PT Time Calculation (min) (ACUTE ONLY): 17 min  Charges:  $Gait Training: 8-22 mins                    Arlyce Dice, DPT Acute Rehabilitation Services Pager: [phone] Office: [place] 10/13/2019, 3:34 PM

## 2019-10-13 NOTE — Progress Notes (Addendum)
PROGRESS NOTE    [name]  VOJ:[id number] DOB: [birth year] DOA: 10/10/2019 PCP: Seward Carol, MD   Brief Narrative:  [name] is a 86 y.o. male with medical history significant of nonhemorrhagic CVA, hypertension, congestive heart failure with EF of 45%, type 2 diabetes mellitus, recurrent GI bleed and following with [name] presented to the emergency department with multiple episodes of bloody bowel movement.Last time he was admitted on May of this year with similar complaint.  At that time no intervention was done and he was transfused with a unit of PRBC. Before that, he was admitted on March of this year. He underwent a full work-up on March with colonoscopy, EGD without finding of clear source of bleeding.  He also underwent CT angiogram and bleeding scan, the later suggested possible right colonic source.He had known history of diverticulosis so it was presumed the cause of GI bleed.  After a long discussion with his gastroenterologist( Dr Ardis Hughs), no further intervention in the future was planned. On presentation he was hemodynamically stable.  His hemoglobin was stable in the range of 13 on presentation. GI is following.  He again had a big bloody bowel movement on the  Morning of 10/12/19 and his hemoglobin dropped to the range of 7.  CT angiogram did not show any evidence of active bleeding.  Assessment & Plan:   Principal Problem:   GI bleed Active Problems:   Diabetes mellitus type II, non insulin dependent (HCC)   HLD (hyperlipidemia)   Chronic anticoagulation 2/2 history of CVA   Chronic combined systolic and diastolic CHF (congestive heart failure) (HCC)   Acute on chronic GI bleed: Presented with  bloody bowels at home, had another big bloody bowel movement in the emergency department.    Became diaphoretic after the bloody bowel movement in the emergency department and was given IV fluids and unit of PRBC.  We will continue to monitor H&H.  GI consulted.  Patient  prefers not to do any intervention on this admission. Last time he was admitted on March  of this year with similar complaint.  He had known history of diverticulosis so it was presumed the cause of GI bleed.  He underwent a full work-up on last admission with colonoscopy, EGD without finding of clear source of bleeding.  He also underwent CT angiogram and bleeding scan, the latter suggested possible right colonic source. He was again admitted on May 2021 for similar complaints at the time work-up was not done.This could be  from diverticular bleed but we have started on Protonix 40 mg IV twice daily for now.   GI is following.  He again had a big bloody bowel movement on morning of 09/22/19 .  Hemoglobin dropped to the range of 7.5.  CT angiogram did not show any active bleeding. Total of 2 units of transfusions during this hospitalization. Diet advanced to full today.  Nonhemorrhagic CVA: He said he has  left-sided residual weakness.  Ambulates with the help of walker.  Takes Plavix for the history of stroke.  Plavix on hold.  Chronic systolic congestive heart failure: Last echocardiogram showed ejection fraction 45%.  Euvolemic on presentation.  Continue gentle IV fluids Does not take any diuretics at home.  Diabetes type 2: Continue sliding scale insulin for now.  Continue to monitor CBGs.  History of restless leg syndrome: On ropinirole.  Hypertension:  Blood pressure is well controlled.  HLD: Takes  simvastatin     Nutrition Problem: Inadequate protein  intake Etiology: other (see comment) (current diet order)      DVT prophylaxis: SCD Code Status: DNR Family Communication: Discussed with wife on phone on 10/13/2019. Status is: Inpatient   Dispo: The patient is from: Home              Anticipated d/c is to: Home              Anticipated d/c date is: 1-2 days              Patient currently is not medically stable to d/c.  Plan is to do CT angiogram.  Patient having active  GI bleed   Consultants: GI  Procedures:None  Antimicrobials:  Anti-infectives (From admission, onward)   None      Subjective: Patient seen and examined the bedside this morning.  Hemodynamically stable.  Very comfortable today.  Sitting in the chair.  Eager to go home but we discussed that it is important to be monitored for 1 more night.  Will be checking hemoglobin later this afternoon.  If he does not bleed, then he will be discharged home tomorrow.  Objective: Vitals:   10/12/19 1828 10/12/19 1958 10/12/19 2007 10/13/19 0509  BP: (!) 112/94  114/62 125/65  Pulse: 99  84 84  Resp: (!) 21  17 20   Temp: 99.3 F (37.4 C)  98.4 F (36.9 C) 98 F (36.7 C)  TempSrc: Oral  Oral Oral  SpO2: 97% 98% 98% 100%  Weight:      Height:        Intake/Output Summary (Last 24 hours) at 10/13/2019 0825 Last data filed at 10/13/2019 0445 Gross per 24 hour  Intake 2043.4 ml  Output 325 ml  Net 1718.4 ml   Filed Weights   10/11/19 0300  Weight: 67.5 kg    Examination:    General exam: Pleasant elderly gentleman HEENT:PERRL,Oral mucosa moist, Ear/Nose normal on gross exam Respiratory system: Bilateral equal air entry, normal vesicular breath sounds, no wheezes or crackles  Cardiovascular system: S1 & S2 heard, RRR. No JVD, murmurs, rubs, gallops or clicks. Gastrointestinal system: Abdomen is nondistended, soft and nontender. No organomegaly or masses felt. Normal bowel sounds heard. Central nervous system: Alert and oriented. No focal neurological deficits. Extremities: No edema, no clubbing ,no cyanosis Skin: No rashes, lesions or ulcers,no icterus ,no pallor    Data Reviewed: I have personally reviewed following labs and imaging studies  CBC: Recent Labs  Lab 10/10/19 1410 10/10/19 1410 10/10/19 1540 10/10/19 2138 10/11/19 0525 10/11/19 0948 10/12/19 0659 10/12/19 1218 10/12/19 2210  WBC 9.5  --  10.0  --  11.4*  --  11.8*  --   --   NEUTROABS 7.2  --  7.0  --    --   --  8.6*  --   --   HGB 13.4   < > 12.1*   < > 10.7* 10.4* 8.1* 7.5* 8.1*  HCT 41.6   < > 38.0*   < > 33.3* 32.0* 25.6* 23.0* 24.3*  MCV 97.9  --  97.7  --  97.9  --  98.5  --   --   PLT 309  --  315  --  208  --  212  --   --    < > = values in this interval not displayed.   Basic Metabolic Panel: Recent Labs  Lab 10/10/19 1410 10/11/19 0525 10/13/19 0536  NA 135 136 138  K 4.9 4.1 3.4*  CL 101  105 109  CO2 26 23 24   GLUCOSE 164* 147* 137*  BUN 18 27* 32*  CREATININE 0.71 0.62 0.67  CALCIUM 8.6* 8.1* 7.6*   GFR: Estimated Creatinine Clearance: 63.3 mL/min (by C-G formula based on SCr of 0.67 mg/dL). Liver Function Tests: Recent Labs  Lab 10/10/19 1410  AST 13*  ALT 10  ALKPHOS 56  BILITOT 0.5  PROT 5.7*  ALBUMIN 3.1*   Recent Labs  Lab 10/10/19 1410  LIPASE 20   No results for input(s): AMMONIA in the last 168 hours. Coagulation Profile: No results for input(s): INR, PROTIME in the last 168 hours. Cardiac Enzymes: No results for input(s): CKTOTAL, CKMB, CKMBINDEX, TROPONINI in the last 168 hours. BNP (last 3 results) No results for input(s): PROBNP in the last 8760 hours. HbA1C: No results for input(s): HGBA1C in the last 72 hours. CBG: Recent Labs  Lab 10/12/19 0723 10/12/19 1108 10/12/19 1616 10/12/19 2142 10/13/19 0733  GLUCAP 141* 139* 121* 122* 111*   Lipid Profile: No results for input(s): CHOL, HDL, LDLCALC, TRIG, CHOLHDL, LDLDIRECT in the last 72 hours. Thyroid Function Tests: No results for input(s): TSH, T4TOTAL, FREET4, T3FREE, THYROIDAB in the last 72 hours. Anemia Panel: No results for input(s): VITAMINB12, FOLATE, FERRITIN, TIBC, IRON, RETICCTPCT in the last 72 hours. Sepsis Labs: Recent Labs  Lab 10/10/19 1410  LATICACIDVEN 1.9    Recent Results (from the past 240 hour(s))  SARS Coronavirus 2 by RT PCR (hospital order, performed in [facility] hospital lab) Nasopharyngeal Nasopharyngeal Swab     Status: None   Collection  Time: 10/10/19  5:38 PM   Specimen: Nasopharyngeal Swab  Result Value Ref Range Status   SARS Coronavirus 2 NEGATIVE NEGATIVE Final    Comment: (NOTE) SARS-CoV-2 target nucleic acids are NOT DETECTED.  The SARS-CoV-2 RNA is generally detectable in upper and lower respiratory specimens during the acute phase of infection. The lowest concentration of SARS-CoV-2 viral copies this assay can detect is 250 copies / mL. A negative result does not preclude SARS-CoV-2 infection and should not be used as the sole basis for treatment or other patient management decisions.  A negative result may occur with improper specimen collection / handling, submission of specimen other than nasopharyngeal swab, presence of viral mutation(s) within the areas targeted by this assay, and inadequate number of viral copies (<250 copies / mL). A negative result must be combined with clinical observations, patient history, and epidemiological information.  Fact Sheet for Patients:   StrictlyIdeas.no  Fact Sheet for Healthcare Providers: BankingDealers.co.za  This test is not yet approved or  cleared by the [place] FDA and has been authorized for detection and/or diagnosis of SARS-CoV-2 by FDA under an Emergency Use Authorization (EUA).  This EUA will remain in effect (meaning this test can be used) for the duration of the COVID-19 declaration under Section 564(b)(1) of the Act, 21 U.S.C. section 360bbb-3(b)(1), unless the authorization is terminated or revoked sooner.  Performed at [facility], [place] [address] [place], [place] [address]          Radiology Studies: CT Angio Abd/Pel w/ and/or w/o  Result Date: 10/12/2019 CLINICAL DATA:  86 year old with GI bleeding. History of splenectomy. EXAM: CTA ABDOMEN AND PELVIS WITHOUT AND WITH CONTRAST TECHNIQUE: Multidetector CT imaging of the abdomen and pelvis was performed using the  standard protocol during bolus administration of intravenous contrast. Multiplanar reconstructed images and MIPs were obtained and reviewed to evaluate the vascular anatomy. CONTRAST:  168mL OMNIPAQUE  IOHEXOL 350 MG/ML SOLN COMPARISON:  CT 8 04/01/2019 FINDINGS: VASCULAR Aorta: Atherosclerotic disease in the abdominal aorta with aneurysm or dissection. Celiac: Celiac trunk is widely patent. Ligation of the proximal splenic artery compatible with history of splenectomy. Common hepatic artery and left gastric artery are patent. SMA: Patent without evidence of aneurysm, dissection, vasculitis or significant stenosis. Renals: Both renal arteries are patent without evidence of aneurysm, dissection, vasculitis, fibromuscular dysplasia or significant stenosis. IMA: Patent without evidence of aneurysm, dissection, vasculitis or significant stenosis. Inflow: Distal right common iliac artery measures 1.8 cm and stable. Common, internal and external iliac arteries are patent bilaterally without significant stenosis. Proximal Outflow: Proximal femoral arteries are patent bilaterally. Veins: Portal venous system is patent. IVC and renal veins are patent. Iliac veins are patent. Review of the MIP images confirms the above findings. NON-VASCULAR Lower chest: Lung bases are clear. Hepatobiliary: Normal appearance of the liver and gallbladder. No biliary dilatation. Pancreas: Unremarkable. No pancreatic ductal dilatation or surrounding inflammatory changes. Spleen: Status post splenectomy. Again noted is a small amount of soft tissue just posterior to the spleen on sequence 10, image 23 that measures up to 2.1 cm. This is unchanged from the previous examination and could represent residual splenic tissue or a splenule. Difficult to exclude a small amount of residual splenic tissue near the pancreatic tail which is also stable. Adrenals/Urinary Tract: Normal adrenal glands. Normal appearance of the urinary bladder. No hydronephrosis.  There is probably a hyperdense cyst in the mid right kidney on sequence 10 image 34 that measures up to 1.0 cm and minimally changed from the previous examination. At least 1 small exophytic cyst in the right kidney. Left renal cysts. Stomach/Bowel: Hiatal hernia containing a small amount of oral contrast. Evidence for postsurgical changes associated with the hiatal hernia. No evidence for contrast extravasation or active GI bleeding. No evidence for focal bowel inflammation. Cecum is located in the anterior right abdomen. Extensive diverticulosis involving the sigmoid colon. Again noted is a round fat attenuating lesion in the left abdomen adjacent to loops of small bowel. This structure is seen on sequence 10, image 47 and measures up to 2.0 cm. There was a similar finding on the previous examination. Lymphatic: No abdominal or pelvic lymphadenopathy. Reproductive: Prostate contains multiple calcifications. Other: Negative for ascites. Negative for free air. Surgical mesh in the upper anterior abdomen. Tiny ventral hernia containing fat in the periumbilical region. Musculoskeletal: No acute bone abnormality. Joint space narrowing involving right hip joint. Multilevel degenerative facet disease in lumbar spine. IMPRESSION: VASCULAR 1. Atherosclerotic disease in the abdominal aorta without aneurysm or dissection. Aortic Atherosclerosis (ICD10-I70.0). 2. Main visceral arteries are patent. Splenic artery is ligated and related to the splenectomy. 3. Stable ectasia involving the distal right common iliac artery measuring up to 1.8 cm. NON-VASCULAR 1. No evidence for active GI bleeding. 2. Colonic diverticulosis. 3. Postsurgical changes compatible with splenectomy. Suspect a small amount of residual splenic tissue near the stomach as described. 4. Hiatal hernia with postsurgical changes. Small amount of oral contrast within the hiatal hernia limits evaluation for bleeding in this area. 5. Bilateral renal cysts. 6.  Probable small lipoma adjacent to small bowel loops in left abdomen. This is a stable finding. Electronically Signed   By: Markus Daft M.D.   On: 10/12/2019 17:32        Scheduled Meds: . (feeding supplement) PROSource Plus  30 mL Oral BID BM  . budesonide  2 mL Nebulization BID  . ferrous sulfate  325 mg Oral Daily  . insulin aspart  0-9 Units Subcutaneous TID WC  . multivitamin with minerals  1 tablet Oral Daily  . pantoprazole  40 mg Oral BID  . rOPINIRole  2 mg Oral QHS   Continuous Infusions: . sodium chloride 75 mL/hr at 10/13/19 0445     LOS: 2 days    Time spent: 25 mins.More than 50% of that time was spent in counseling and/or coordination of care.      Shelly Coss, MD [facility] P8/27/2021, 8:25 AM

## 2019-10-13 NOTE — Progress Notes (Addendum)
[facility] Progress Note  CC:  Hematochezia   Subjective: He feels well today. Sitting up in the chair. No further bloody BMs since yesterday am. No abdominal pain. No N/V. He is tolerating a clear liquid diet. He would like to go home today.    Objective:   S/P abd/pelvic CT angiogram 10/12/2019: VASCULAR 1. Atherosclerotic disease in the abdominal aorta without aneurysm or dissection. Aortic Atherosclerosis (ICD10-I70.0). 2. Main visceral arteries are patent. Splenic artery is ligated and related to the splenectomy. 3. Stable ectasia involving the distal right common iliac artery measuring up to 1.8 cm.  NON-VASCULAR 1. No evidence for active GI bleeding. 2. Colonic diverticulosis. 3. Postsurgical changes compatible with splenectomy. Suspect a small amount of residual splenic tissue near the stomach as described. 4. Hiatal hernia with postsurgical changes. Small amount of oral contrast within the hiatal hernia limits evaluation for bleeding in this area. 5. Bilateral renal cysts. 6. Probable small lipoma adjacent to small bowel loops in left abdomen. This is a stable finding.   Vital signs in last 24 hours: Temp:  [97.5 F (36.4 C)-99.3 F (37.4 C)] 98 F (36.7 C) (08/27 0509) Pulse Rate:  [84-104] 84 (08/27 0509) Resp:  [16-25] 20 (08/27 0509) BP: (98-183)/(57-95) 125/65 (08/27 0509) SpO2:  [96 %-100 %] 100 % (08/27 0509) Last BM Date: 10/12/19 General:   Alert 86 year old male in NAD.  Heart: Irregular rhythm, no murmur. Pulm: Breath sounds clear throughout.  Abdomen: Abdomen soft, nondistended. Nontender. Midline scar with hernia. No HSM. + BS x 4quads. Extremities:  Without edema. Neurologic:  Alert and  oriented x4;  grossly normal neurologically. Psych:  Alert and cooperative. Normal mood and affect.  Intake/Output from previous day: 08/26 0701 - 08/27 0700 In: 2043.4 [P.O.:240; I.V.:1363.7; Blood:439.7] Out: 325  [Urine:325] Intake/Output this shift: No intake/output data recorded.  Lab Results: Recent Labs    10/11/19 0525 10/11/19 0948 10/12/19 0659 10/12/19 0659 10/12/19 1218 10/12/19 2210 10/13/19 0839  WBC 11.4*  --  11.8*  --   --   --  8.7  HGB 10.7*   < > 8.1*   < > 7.5* 8.1* 7.3*  HCT 33.3*   < > 25.6*   < > 23.0* 24.3* 22.3*  PLT 208  --  212  --   --   --  211   < > = values in this interval not displayed.   BMET Recent Labs    10/10/19 1410 10/11/19 0525 10/13/19 0536  NA 135 136 138  K 4.9 4.1 3.4*  CL 101 105 109  CO2 26 23 24   GLUCOSE 164* 147* 137*  BUN 18 27* 32*  CREATININE 0.71 0.62 0.67  CALCIUM 8.6* 8.1* 7.6*   LFT Recent Labs    10/10/19 1410  PROT 5.7*  ALBUMIN 3.1*  AST 13*  ALT 10  ALKPHOS 56  BILITOT 0.5   PT/INR No results for input(s): LABPROT, INR in the last 72 hours. Hepatitis Panel No results for input(s): HEPBSAG, HCVAB, HEPAIGM, HEPBIGM in the last 72 hours.  CT Angio Abd/Pel w/ and/or w/o  Result Date: 10/12/2019 CLINICAL DATA:  86 year old with GI bleeding. History of splenectomy. EXAM: CTA ABDOMEN AND PELVIS WITHOUT AND WITH CONTRAST TECHNIQUE: Multidetector CT imaging of the abdomen and pelvis was performed using the standard protocol during bolus administration of intravenous contrast. Multiplanar reconstructed images and MIPs were obtained and reviewed to evaluate the vascular anatomy. CONTRAST:  118mL OMNIPAQUE IOHEXOL 350 MG/ML  SOLN COMPARISON:  CT 8 04/01/2019 FINDINGS: VASCULAR Aorta: Atherosclerotic disease in the abdominal aorta with aneurysm or dissection. Celiac: Celiac trunk is widely patent. Ligation of the proximal splenic artery compatible with history of splenectomy. Common hepatic artery and left gastric artery are patent. SMA: Patent without evidence of aneurysm, dissection, vasculitis or significant stenosis. Renals: Both renal arteries are patent without evidence of aneurysm, dissection, vasculitis, fibromuscular  dysplasia or significant stenosis. IMA: Patent without evidence of aneurysm, dissection, vasculitis or significant stenosis. Inflow: Distal right common iliac artery measures 1.8 cm and stable. Common, internal and external iliac arteries are patent bilaterally without significant stenosis. Proximal Outflow: Proximal femoral arteries are patent bilaterally. Veins: Portal venous system is patent. IVC and renal veins are patent. Iliac veins are patent. Review of the MIP images confirms the above findings. NON-VASCULAR Lower chest: Lung bases are clear. Hepatobiliary: Normal appearance of the liver and gallbladder. No biliary dilatation. Pancreas: Unremarkable. No pancreatic ductal dilatation or surrounding inflammatory changes. Spleen: Status post splenectomy. Again noted is a small amount of soft tissue just posterior to the spleen on sequence 10, image 23 that measures up to 2.1 cm. This is unchanged from the previous examination and could represent residual splenic tissue or a splenule. Difficult to exclude a small amount of residual splenic tissue near the pancreatic tail which is also stable. Adrenals/Urinary Tract: Normal adrenal glands. Normal appearance of the urinary bladder. No hydronephrosis. There is probably a hyperdense cyst in the mid right kidney on sequence 10 image 34 that measures up to 1.0 cm and minimally changed from the previous examination. At least 1 small exophytic cyst in the right kidney. Left renal cysts. Stomach/Bowel: Hiatal hernia containing a small amount of oral contrast. Evidence for postsurgical changes associated with the hiatal hernia. No evidence for contrast extravasation or active GI bleeding. No evidence for focal bowel inflammation. Cecum is located in the anterior right abdomen. Extensive diverticulosis involving the sigmoid colon. Again noted is a round fat attenuating lesion in the left abdomen adjacent to loops of small bowel. This structure is seen on sequence 10, image  47 and measures up to 2.0 cm. There was a similar finding on the previous examination. Lymphatic: No abdominal or pelvic lymphadenopathy. Reproductive: Prostate contains multiple calcifications. Other: Negative for ascites. Negative for free air. Surgical mesh in the upper anterior abdomen. Tiny ventral hernia containing fat in the periumbilical region. Musculoskeletal: No acute bone abnormality. Joint space narrowing involving right hip joint. Multilevel degenerative facet disease in lumbar spine. IMPRESSION: VASCULAR 1. Atherosclerotic disease in the abdominal aorta without aneurysm or dissection. Aortic Atherosclerosis (ICD10-I70.0). 2. Main visceral arteries are patent. Splenic artery is ligated and related to the splenectomy. 3. Stable ectasia involving the distal right common iliac artery measuring up to 1.8 cm. NON-VASCULAR 1. No evidence for active GI bleeding. 2. Colonic diverticulosis. 3. Postsurgical changes compatible with splenectomy. Suspect a small amount of residual splenic tissue near the stomach as described. 4. Hiatal hernia with postsurgical changes. Small amount of oral contrast within the hiatal hernia limits evaluation for bleeding in this area. 5. Bilateral renal cysts. 6. Probable small lipoma adjacent to small bowel loops in left abdomen. This is a stable finding. Electronically Signed   By: Markus Daft M.D.   On: 10/12/2019 17:32    Assessment / Plan:  88. 86 year old male with recurrent painless hematochezia on Plavix. Admission Hg 13.4 -> 12.1. He was tranfused 1 unit of PRBCs on 8/24. He had a  large bloody BM yesterday. Hg 10.4 dropped to 8.1. Transfused 2nd unit of PRBCs 8/26 -> Hg 8.1.  Abd/pelvic CT angiogram 8/26 without evidence of active GI bleeding.Today Hg down 7.3. No rectal bleeding today. Plavix remains on hold. He is hemodynamically stable.  -Repeat H/H today -Transfuse for Hg < 7 -If H/H stable and no recurrence of bleeding possible discharge later today. Await 2pm  H/H result. -RN will contact our service if he passes any blood per the rectum  -Dr. Lyndel Safe to verify Plavix restart date    2. History of CVA. Plavix on Hold.   Principal Problem:   GI bleed Active Problems:   Diabetes mellitus type II, non insulin dependent (HCC)   HLD (hyperlipidemia)   Chronic anticoagulation 2/2 history of CVA   Chronic combined systolic and diastolic CHF (congestive heart failure) ([place])     LOS: 2 days   [name]  10/13/2019, 10:02 AM    Attending physician's note   I have taken an interval history, reviewed the chart and examined the patient. I agree with the Advanced Practitioner's note, impression and recommendations.   Painless hematochezia, likely recurrent diverticular bleed (resolved) s/p 2U PRBC, neg CTA. Hb 7.3 this AM (baseline 12).  Multiple prior episodes/hospitalizations in past H/O CVA/CHF on plavix on adm  Plan: -Recheck Hb/Hct at 2 PM today. Transfuse if Hb<7. If OK, can D/C. -Advance diet to full liquid diet -Reasonable to restart Plavix in 1 week. -Recheck CBC in 1 week at assisted care facility. -D/W patient extensively.    Carmell Austria, MD [facility]

## 2019-10-14 LAB — GLUCOSE, CAPILLARY
Glucose-Capillary: 125 mg/dL — ABNORMAL HIGH (ref 70–99)
Glucose-Capillary: 137 mg/dL — ABNORMAL HIGH (ref 70–99)
Glucose-Capillary: 126 mg/dL — ABNORMAL HIGH (ref 70–99)
Glucose-Capillary: 126 mg/dL — ABNORMAL HIGH (ref 70–99)

## 2019-10-14 LAB — BPAM RBC
Blood Product Expiration Date: 202109222359
Blood Product Expiration Date: 202109242359
Blood Product Expiration Date: 202109242359
ISSUE DATE / TIME: 202108241703
ISSUE DATE / TIME: 202108261636
Unit Type and Rh: 5100
Unit Type and Rh: 5100
Unit Type and Rh: 5100

## 2019-10-14 LAB — CBC WITH DIFFERENTIAL/PLATELET
Abs Immature Granulocytes: 0.03 10*3/uL (ref 0.00–0.07)
Basophils Absolute: 0.1 10*3/uL (ref 0.0–0.1)
Basophils Relative: 1 %
Eosinophils Absolute: 0.6 10*3/uL — ABNORMAL HIGH (ref 0.0–0.5)
Eosinophils Relative: 7 %
HCT: 20.8 % — ABNORMAL LOW (ref 39.0–52.0)
Hemoglobin: 6.7 g/dL — CL (ref 13.0–17.0)
Immature Granulocytes: 0 %
Lymphocytes Relative: 24 %
Lymphs Abs: 2 10*3/uL (ref 0.7–4.0)
MCH: 31.9 pg (ref 26.0–34.0)
MCHC: 32.2 g/dL (ref 30.0–36.0)
MCV: 99 fL (ref 80.0–100.0)
Monocytes Absolute: 1.1 10*3/uL — ABNORMAL HIGH (ref 0.1–1.0)
Monocytes Relative: 14 %
Neutro Abs: 4.6 10*3/uL (ref 1.7–7.7)
Neutrophils Relative %: 54 %
Platelets: 213 10*3/uL (ref 150–400)
RBC: 2.1 MIL/uL — ABNORMAL LOW (ref 4.22–5.81)
RDW: 15.7 % — ABNORMAL HIGH (ref 11.5–15.5)
WBC: 8.3 10*3/uL (ref 4.0–10.5)
nRBC: 0.4 % — ABNORMAL HIGH (ref 0.0–0.2)

## 2019-10-14 LAB — TYPE AND SCREEN
ABO/RH(D): O POS
Antibody Screen: NEGATIVE
Unit division: 0
Unit division: 0
Unit division: 0

## 2019-10-14 LAB — PREPARE RBC (CROSSMATCH)

## 2019-10-14 MED ORDER — SODIUM CHLORIDE 0.9% IV SOLUTION: Freq: Once | INTRAVENOUS | Status: AC

## 2019-10-14 MED ORDER — PSYLLIUM 95 % PO PACK
1.0000 | PACK | Freq: Every day | ORAL | Status: DC
  Administered 2019-10-14 – 2019-10-15 (×2): 1 via ORAL
  Filled 2019-10-14 (×2): qty 1

## 2019-10-14 NOTE — Progress Notes (Signed)
PROGRESS NOTE    [name]  ERX:[id number] DOB: December 07, 1931 DOA: 10/10/2019 PCP: Seward Carol, MD   Brief Narrative:  [name] is a 86 y.o. male with medical history significant of nonhemorrhagic CVA, hypertension, congestive heart failure with EF of 45%, type 2 diabetes mellitus, recurrent GI bleed and following with [name] presented to the emergency department with multiple episodes of bloody bowel movement.Last time he was admitted on May of this year with similar complaint.  At that time no intervention was done and he was transfused with a unit of PRBC. Before that, he was admitted on March of this year. He underwent a full work-up on March with colonoscopy, EGD without finding of clear source of bleeding.  He also underwent CT angiogram and bleeding scan, the later suggested possible right colonic source.He had known history of diverticulosis so it was presumed the cause of GI bleed.  After a long discussion with his gastroenterologist( Dr Ardis Hughs), no further intervention in the future was planned. On presentation he was hemodynamically stable.  His hemoglobin was stable in the range of 13 on presentation. GI is following.  He again had a big bloody bowel movement on the  Morning of 10/12/19 and his hemoglobin dropped to the range of 7.  CT angiogram did not show any evidence of active bleeding. Hb dropped to 6.7 today.  Being transfused with 2 units of PRBC.  Assessment & Plan:   Principal Problem:   GI bleed Active Problems:   Diabetes mellitus type II, non insulin dependent (HCC)   HLD (hyperlipidemia)   Chronic anticoagulation 2/2 history of CVA   Chronic combined systolic and diastolic CHF (congestive heart failure) (HCC)   Acute on chronic GI bleed: Presented with  bloody bowels at home, had another big bloody bowel movement in the emergency department.    Became diaphoretic after the bloody bowel movement in the emergency department and was given IV fluids and unit of  PRBC.  We will continue to monitor H&H.  GI consulted.  Patient prefers not to do any intervention on this admission. Last time he was admitted on March  of this year with similar complaint.  He had known history of diverticulosis so it was presumed the cause of GI bleed.  He underwent a full work-up on last admission with colonoscopy, EGD without finding of clear source of bleeding.  He also underwent CT angiogram and bleeding scan, the latter suggested possible right colonic source. He was again admitted on May 2021 for similar complaints at the time work-up was not done.This could be  from diverticular bleed but we have started on Protonix 40 mg IV twice daily for now.   GI is following.  He again had a big bloody bowel movement on morning of 10/12/19 .  Hemoglobin dropped to the range of 7.5.  CT angiogram did not show any active bleeding. His hemoglobin dropped to 6.7 today.  Ordered another 2 units of blood.  No bowel movement or bleeding episodes since 10/12/2019 Total of 4 units of transfusions during this hospitalization. Currently on soft diet.  We will downgrade the diet if he has any rectal bleeding.  Nonhemorrhagic CVA: He said he has  left-sided residual weakness.  Ambulates with the help of walker.  Takes Plavix for the history of stroke.  Plavix on hold.  Chronic systolic congestive heart failure: Last echocardiogram showed ejection fraction 45%.  Euvolemic on presentation.  Does not take any diuretics at home.  Diabetes  type 2: Continue sliding scale insulin for now.  Continue to monitor CBGs.  History of restless leg syndrome: On ropinirole.  Hypertension:  Blood pressure is well controlled.  HLD: Takes  simvastatin     Nutrition Problem: Inadequate protein intake Etiology: other (see comment) (current diet order)      DVT prophylaxis: SCD Code Status: DNR Family Communication: Discussed with wife on phone on 10/13/2019. Status is: Inpatient   Dispo: The  patient is from: Home              Anticipated d/c is to: Home              Anticipated d/c date is: 1-2 days              Patient currently is not medically stable to d/c.  Patient hemoglobin dropped to 6.7.  He needs to be monitored for 1 more night.  Being transfused with PRBC.   Consultants: GI  Procedures:None  Antimicrobials:  Anti-infectives (From admission, onward)   None      Subjective: Patient seen and examined the bedside this morning.  Hemodynamically stable.  His hemoglobin dropped to 6.7 this morning.  Being transfused units of PRBC.  Patient also not feeling well and has a feeling of smelling blood.  Objective: Vitals:   10/13/19 1527 10/13/19 2032 10/13/19 2103 10/14/19 0457  BP: 116/62  132/61 (!) 116/58  Pulse: 92  86 84  Resp: 20  (!) 22 20  Temp: (!) 97.3 F (36.3 C)  98 F (36.7 C) 97.8 F (36.6 C)  TempSrc: Oral  Oral Oral  SpO2: 100% 96% 98% 98%  Weight:      Height:        Intake/Output Summary (Last 24 hours) at 10/14/2019 0743 Last data filed at 10/14/2019 0500 Gross per 24 hour  Intake 480 ml  Output 650 ml  Net -170 ml   Filed Weights   10/11/19 0300  Weight: 67.5 kg    Examination:    General exam: Pleasant elderly gentleman, overall comfortable. Respiratory system: Bilateral equal air entry, normal vesicular breath sounds, no wheezes or crackles  Cardiovascular system: S1 & S2 heard, RRR. No JVD, murmurs, rubs, gallops or clicks. Gastrointestinal system: Abdomen is nondistended, soft and nontender. No organomegaly or masses felt. Normal bowel sounds heard. Central nervous system: Alert and oriented. No focal neurological deficits. Extremities: No edema, no clubbing ,no cyanosis, distal peripheral pulses palpable. Skin: No rashes, lesions or ulcers,no icterus ,no pallor    Data Reviewed: I have personally reviewed following labs and imaging studies  CBC: Recent Labs  Lab 10/10/19 1410 10/10/19 1410 10/10/19 1540  10/10/19 2138 10/11/19 0525 10/11/19 0948 10/12/19 0659 10/12/19 0659 10/12/19 1218 10/12/19 2210 10/13/19 0839 10/13/19 1338 10/14/19 0653  WBC 9.5   < > 10.0  --  11.4*  --  11.8*  --   --   --  8.7  --  8.3  NEUTROABS 7.2  --  7.0  --   --   --  8.6*  --   --   --  5.1  --  4.6  HGB 13.4   < > 12.1*   < > 10.7*   < > 8.1*   < > 7.5* 8.1* 7.3* 8.3* 6.7*  HCT 41.6   < > 38.0*   < > 33.3*   < > 25.6*   < > 23.0* 24.3* 22.3* 25.1* 20.8*  MCV 97.9   < > 97.7  --  97.9  --  98.5  --   --   --  97.4  --  99.0  PLT 309   < > 315  --  208  --  212  --   --   --  211  --  213   < > = values in this interval not displayed.   Basic Metabolic Panel: Recent Labs  Lab 10/10/19 1410 10/11/19 0525 10/13/19 0536  NA 135 136 138  K 4.9 4.1 3.4*  CL 101 105 109  CO2 26 23 24   GLUCOSE 164* 147* 137*  BUN 18 27* 32*  CREATININE 0.71 0.62 0.67  CALCIUM 8.6* 8.1* 7.6*   GFR: Estimated Creatinine Clearance: 63.3 mL/min (by C-G formula based on SCr of 0.67 mg/dL). Liver Function Tests: Recent Labs  Lab 10/10/19 1410  AST 13*  ALT 10  ALKPHOS 56  BILITOT 0.5  PROT 5.7*  ALBUMIN 3.1*   Recent Labs  Lab 10/10/19 1410  LIPASE 20   No results for input(s): AMMONIA in the last 168 hours. Coagulation Profile: No results for input(s): INR, PROTIME in the last 168 hours. Cardiac Enzymes: No results for input(s): CKTOTAL, CKMB, CKMBINDEX, TROPONINI in the last 168 hours. BNP (last 3 results) No results for input(s): PROBNP in the last 8760 hours. HbA1C: No results for input(s): HGBA1C in the last 72 hours. CBG: Recent Labs  Lab 10/12/19 2142 10/13/19 0733 10/13/19 1130 10/13/19 1655 10/13/19 2100  GLUCAP 122* 111* 150* 116* 106*   Lipid Profile: No results for input(s): CHOL, HDL, LDLCALC, TRIG, CHOLHDL, LDLDIRECT in the last 72 hours. Thyroid Function Tests: No results for input(s): TSH, T4TOTAL, FREET4, T3FREE, THYROIDAB in the last 72 hours. Anemia Panel: No results for  input(s): VITAMINB12, FOLATE, FERRITIN, TIBC, IRON, RETICCTPCT in the last 72 hours. Sepsis Labs: Recent Labs  Lab 10/10/19 1410  LATICACIDVEN 1.9    Recent Results (from the past 240 hour(s))  SARS Coronavirus 2 by RT PCR (hospital order, performed in [facility] hospital lab) Nasopharyngeal Nasopharyngeal Swab     Status: None   Collection Time: 10/10/19  5:38 PM   Specimen: Nasopharyngeal Swab  Result Value Ref Range Status   SARS Coronavirus 2 NEGATIVE NEGATIVE Final    Comment: (NOTE) SARS-CoV-2 target nucleic acids are NOT DETECTED.  The SARS-CoV-2 RNA is generally detectable in upper and lower respiratory specimens during the acute phase of infection. The lowest concentration of SARS-CoV-2 viral copies this assay can detect is 250 copies / mL. A negative result does not preclude SARS-CoV-2 infection and should not be used as the sole basis for treatment or other patient management decisions.  A negative result may occur with improper specimen collection / handling, submission of specimen other than nasopharyngeal swab, presence of viral mutation(s) within the areas targeted by this assay, and inadequate number of viral copies (<250 copies / mL). A negative result must be combined with clinical observations, patient history, and epidemiological information.  Fact Sheet for Patients:   StrictlyIdeas.no  Fact Sheet for Healthcare Providers: BankingDealers.co.za  This test is not yet approved or  cleared by the [place] FDA and has been authorized for detection and/or diagnosis of SARS-CoV-2 by FDA under an Emergency Use Authorization (EUA).  This EUA will remain in effect (meaning this test can be used) for the duration of the COVID-19 declaration under Section 564(b)(1) of the Act, 21 U.S.C. section 360bbb-3(b)(1), unless the authorization is terminated or revoked sooner.  Performed at [facility],  [place] [address], [place], [place] [address]          Radiology Studies: CT Angio Abd/Pel w/ and/or w/o  Result Date: 10/12/2019 CLINICAL DATA:  86 year old with GI bleeding. History of splenectomy. EXAM: CTA ABDOMEN AND PELVIS WITHOUT AND WITH CONTRAST TECHNIQUE: Multidetector CT imaging of the abdomen and pelvis was performed using the standard protocol during bolus administration of intravenous contrast. Multiplanar reconstructed images and MIPs were obtained and reviewed to evaluate the vascular anatomy. CONTRAST:  170mL OMNIPAQUE IOHEXOL 350 MG/ML SOLN COMPARISON:  CT 8 04/01/2019 FINDINGS: VASCULAR Aorta: Atherosclerotic disease in the abdominal aorta with aneurysm or dissection. Celiac: Celiac trunk is widely patent. Ligation of the proximal splenic artery compatible with history of splenectomy. Common hepatic artery and left gastric artery are patent. SMA: Patent without evidence of aneurysm, dissection, vasculitis or significant stenosis. Renals: Both renal arteries are patent without evidence of aneurysm, dissection, vasculitis, fibromuscular dysplasia or significant stenosis. IMA: Patent without evidence of aneurysm, dissection, vasculitis or significant stenosis. Inflow: Distal right common iliac artery measures 1.8 cm and stable. Common, internal and external iliac arteries are patent bilaterally without significant stenosis. Proximal Outflow: Proximal femoral arteries are patent bilaterally. Veins: Portal venous system is patent. IVC and renal veins are patent. Iliac veins are patent. Review of the MIP images confirms the above findings. NON-VASCULAR Lower chest: Lung bases are clear. Hepatobiliary: Normal appearance of the liver and gallbladder. No biliary dilatation. Pancreas: Unremarkable. No pancreatic ductal dilatation or surrounding inflammatory changes. Spleen: Status post splenectomy. Again noted is a small amount of soft tissue just posterior to the spleen on sequence 10, image 23  that measures up to 2.1 cm. This is unchanged from the previous examination and could represent residual splenic tissue or a splenule. Difficult to exclude a small amount of residual splenic tissue near the pancreatic tail which is also stable. Adrenals/Urinary Tract: Normal adrenal glands. Normal appearance of the urinary bladder. No hydronephrosis. There is probably a hyperdense cyst in the mid right kidney on sequence 10 image 34 that measures up to 1.0 cm and minimally changed from the previous examination. At least 1 small exophytic cyst in the right kidney. Left renal cysts. Stomach/Bowel: Hiatal hernia containing a small amount of oral contrast. Evidence for postsurgical changes associated with the hiatal hernia. No evidence for contrast extravasation or active GI bleeding. No evidence for focal bowel inflammation. Cecum is located in the anterior right abdomen. Extensive diverticulosis involving the sigmoid colon. Again noted is a round fat attenuating lesion in the left abdomen adjacent to loops of small bowel. This structure is seen on sequence 10, image 47 and measures up to 2.0 cm. There was a similar finding on the previous examination. Lymphatic: No abdominal or pelvic lymphadenopathy. Reproductive: Prostate contains multiple calcifications. Other: Negative for ascites. Negative for free air. Surgical mesh in the upper anterior abdomen. Tiny ventral hernia containing fat in the periumbilical region. Musculoskeletal: No acute bone abnormality. Joint space narrowing involving right hip joint. Multilevel degenerative facet disease in lumbar spine. IMPRESSION: VASCULAR 1. Atherosclerotic disease in the abdominal aorta without aneurysm or dissection. Aortic Atherosclerosis (ICD10-I70.0). 2. Main visceral arteries are patent. Splenic artery is ligated and related to the splenectomy. 3. Stable ectasia involving the distal right common iliac artery measuring up to 1.8 cm. NON-VASCULAR 1. No evidence for active  GI bleeding. 2. Colonic diverticulosis. 3. Postsurgical changes compatible with splenectomy. Suspect a small amount of residual splenic tissue near the stomach as described. 4.  Hiatal hernia with postsurgical changes. Small amount of oral contrast within the hiatal hernia limits evaluation for bleeding in this area. 5. Bilateral renal cysts. 6. Probable small lipoma adjacent to small bowel loops in left abdomen. This is a stable finding. Electronically Signed   By: Markus Daft M.D.   On: 10/12/2019 17:32        Scheduled Meds: . (feeding supplement) PROSource Plus  30 mL Oral BID BM  . sodium chloride   Intravenous Once  . budesonide  2 mL Nebulization BID  . ferrous sulfate  325 mg Oral Daily  . insulin aspart  0-9 Units Subcutaneous TID WC  . multivitamin with minerals  1 tablet Oral Daily  . pantoprazole  40 mg Oral BID  . rOPINIRole  2 mg Oral QHS   Continuous Infusions:    LOS: 3 days    Time spent: 25 mins.More than 50% of that time was spent in counseling and/or coordination of care.      Shelly Coss, MD [facility] P8/28/2021, 7:43 AM

## 2019-10-14 NOTE — Progress Notes (Signed)
[name]  Chief Complaint: Diverticular bleeding  Subjective  History:  [name] has not had a bloody bowel movement for over 48 hours.  However, his hemoglobin dropped to 6.7 this morning.  He is being transfused 2 units of PRBCs. He feels weak and a little lightheaded this morning.  Denies chest pain dyspnea or dysuria.  His wife is present during the visit.  The patient's Past Medical, Family and Social History were reviewed and are on file in the EMR.  Objective:  Med list reviewed  Current Facility-Administered Medications:  .  (feeding supplement) PROSource Plus liquid 30 mL, 30 mL, Oral, BID BM, Adhikari, Amrit, MD, 30 mL at 10/14/19 0909 .  0.9 %  sodium chloride infusion (Manually program via Guardrails IV Fluids), , Intravenous, Once, Adhikari, Amrit, MD .  albuterol (PROVENTIL) (2.5 MG/3ML) 0.083% nebulizer solution 3 mL, 3 mL, Inhalation, Q6H PRN, Adhikari, Amrit, MD .  albuterol (PROVENTIL) (2.5 MG/3ML) 0.083% nebulizer solution 3 mL, 3 mL, Nebulization, Q6H PRN, Adhikari, Amrit, MD .  budesonide (PULMICORT) nebulizer solution 0.5 mg, 2 mL, Nebulization, BID, Adhikari, Amrit, MD, 0.5 mg at 10/14/19 0933 .  ferrous sulfate tablet 325 mg, 325 mg, Oral, Daily, Adhikari, Amrit, MD, 325 mg at 10/14/19 0907 .  fluticasone (FLONASE) 50 MCG/ACT nasal spray 1 spray, 1 spray, Each Nare, Daily PRN, Adhikari, Amrit, MD .  insulin aspart (novoLOG) injection 0-9 Units, 0-9 Units, Subcutaneous, TID WC, Adhikari, Amrit, MD, 1 Units at 10/14/19 1315 .  melatonin tablet 10 mg, 10 mg, Oral, QHS PRN, Shelly Coss, MD, 10 mg at 10/13/19 2111 .  montelukast (SINGULAIR) tablet 10 mg, 10 mg, Oral, QHS PRN, Shelly Coss, MD, 10 mg at 10/13/19 2111 .  multivitamin with minerals tablet 1 tablet, 1 tablet, Oral, Daily, Shelly Coss, MD, 1 tablet at 10/14/19 0907 .  pantoprazole (PROTONIX) EC tablet 40 mg, 40 mg, Oral, BID, Adrian Saran, RPH, 40 mg at 10/14/19  2947 .  psyllium (HYDROCIL/METAMUCIL) 1 packet, 1 packet, Oral, Daily, Adhikari, Amrit, MD .  rOPINIRole (REQUIP) tablet 2 mg, 2 mg, Oral, QHS, Adhikari, Amrit, MD, 2 mg at 10/13/19 2107   Vital signs in last 24 hrs: Vitals:   10/14/19 1136 10/14/19 1215  BP: (!) 100/56 116/68  Pulse: 80 80  Resp: 20 20  Temp: 98.4 F (36.9 C) 97.7 F (36.5 C)  SpO2: 95% 100%    Physical Exam  Alert conversational, pleasant as always.  Pale  Cardiac: RRR without murmurs, S1S2 heard, no peripheral edema  Pulm: clear to auscultation bilaterally, normal RR and effort noted  Abdomen: soft, no tenderness, with active bowel sounds. No guarding or palpable hepatosplenomegaly.  Labs:  CBC Latest Ref Rng & Units 10/14/2019 10/13/2019 10/13/2019  WBC 4.0 - 10.5 K/uL 8.3 - 8.7  Hemoglobin 13.0 - 17.0 g/dL 6.7(LL) 8.3(L) 7.3(L)  Hematocrit 39 - 52 % 20.8(L) 25.1(L) 22.3(L)  Platelets 150 - 400 K/uL 213 - 211    ___________________________________________ Radiologic studies:   ____________________________________________ Other:   _____________________________________________ Assessment & Plan  Assessment: Recurrent colonic diverticular bleeding Acute blood loss anemia  No passage of blood in over 48 hours, and a CT angiogram was negative at that time.  If he had ongoing bleeding, I would have expected him to pass large amount of blood per rectum by now.  Plan: Clearly he must remain in the hospital for observation and serial hemoglobin checks. Our service should be notified if he has further  hematochezia.  20 minutes were spent on this encounter (including chart review, history/exam, counseling/coordination of care, and documentation)  [name]

## 2019-10-14 NOTE — Progress Notes (Signed)
CRITICAL VALUE ALERT  Critical Value: HMG -6.7  Date & Time Notied:  8/28 @733   Provider Notified: Tawanna Solo   Orders Received/Actions taken:

## 2019-10-15 LAB — BPAM RBC
Blood Product Expiration Date: 202109252359
Blood Product Expiration Date: 202109282359
ISSUE DATE / TIME: 202108281143
ISSUE DATE / TIME: 202108281502
Unit Type and Rh: 5100
Unit Type and Rh: 5100

## 2019-10-15 LAB — TYPE AND SCREEN
ABO/RH(D): O POS
Antibody Screen: NEGATIVE
Unit division: 0
Unit division: 0

## 2019-10-15 LAB — CBC WITH DIFFERENTIAL/PLATELET
Abs Immature Granulocytes: 0.02 10*3/uL (ref 0.00–0.07)
Basophils Absolute: 0.1 10*3/uL (ref 0.0–0.1)
Basophils Relative: 1 %
Eosinophils Absolute: 0.7 10*3/uL — ABNORMAL HIGH (ref 0.0–0.5)
Eosinophils Relative: 8 %
HCT: 29.3 % — ABNORMAL LOW (ref 39.0–52.0)
Hemoglobin: 9.8 g/dL — ABNORMAL LOW (ref 13.0–17.0)
Immature Granulocytes: 0 %
Lymphocytes Relative: 22 %
Lymphs Abs: 1.9 10*3/uL (ref 0.7–4.0)
MCH: 32.3 pg (ref 26.0–34.0)
MCHC: 33.4 g/dL (ref 30.0–36.0)
MCV: 96.7 fL (ref 80.0–100.0)
Monocytes Absolute: 1.2 10*3/uL — ABNORMAL HIGH (ref 0.1–1.0)
Monocytes Relative: 13 %
Neutro Abs: 4.9 10*3/uL (ref 1.7–7.7)
Neutrophils Relative %: 56 %
Platelets: 271 10*3/uL (ref 150–400)
RBC: 3.03 MIL/uL — ABNORMAL LOW (ref 4.22–5.81)
RDW: 15.9 % — ABNORMAL HIGH (ref 11.5–15.5)
WBC: 8.8 10*3/uL (ref 4.0–10.5)
nRBC: 0.3 % — ABNORMAL HIGH (ref 0.0–0.2)

## 2019-10-15 LAB — HEMOGLOBIN AND HEMATOCRIT, BLOOD
HCT: 27.7 % — ABNORMAL LOW (ref 39.0–52.0)
Hemoglobin: 9.3 g/dL — ABNORMAL LOW (ref 13.0–17.0)

## 2019-10-15 LAB — GLUCOSE, CAPILLARY
Glucose-Capillary: 115 mg/dL — ABNORMAL HIGH (ref 70–99)
Glucose-Capillary: 184 mg/dL — ABNORMAL HIGH (ref 70–99)

## 2019-10-15 MED ORDER — PANTOPRAZOLE SODIUM 40 MG PO TBEC: 40.0000 mg | DELAYED_RELEASE_TABLET | Freq: Two times a day (BID) | ORAL | 0 refills | Status: DC

## 2019-10-15 MED ORDER — CLOPIDOGREL BISULFATE 75 MG PO TABS: 75.0000 mg | ORAL_TABLET | Freq: Every day | ORAL | Status: DC

## 2019-10-15 MED ORDER — POLYETHYLENE GLYCOL 3350 17 G PO PACK: 17.0000 g | PACK | Freq: Once | ORAL | Status: DC

## 2019-10-15 NOTE — Plan of Care (Signed)
Patient adequate for discharge.

## 2019-10-15 NOTE — Plan of Care (Signed)
Hemoglobin is improving, will continue to monitor.

## 2019-10-15 NOTE — Progress Notes (Addendum)
          Daily Rounding Note  10/15/2019, 10:16 AM  LOS: 4 days   SUBJECTIVE:   Chief complaint:   Hematochezia, recurrent diverticular bleed Patient feels well.  There is some discussion between the patient and his wife as to when he last had a bowel movement.  He says it was on Tuesday the day he was admitted.  His wife thinks he had a bowel movement that was darker on Thursday.  Tolerating solid food.  Never has had abdominal pain.  No weakness when ambulating in the room using his walker  OBJECTIVE:         Vital signs in last 24 hours:    Temp:  [97.5 F (36.4 C)-98.4 F (36.9 C)] 98 F (36.7 C) (08/29 0526) Pulse Rate:  [80-92] 92 (08/29 0526) Resp:  [18-20] 18 (08/29 0526) BP: (100-139)/(56-88) 139/88 (08/29 0526) SpO2:  [95 %-100 %] 96 % (08/29 0839) Last BM Date: 10/12/19 Filed Weights   10/11/19 0300  Weight: 67.5 kg   General: Frail, pleasant, comfortable. Heart: RRR Chest: No labored breathing.  No cough Abdomen: Soft, nontender, nondistended.  Active bowel sounds. Extremities: No CCE. Neuro/Psych: Pleasant, calm, fluid speech.  Not confused.  Intake/Output from previous day: 08/28 0701 - 08/29 0700 In: 1042.5 [P.O.:200; Blood:842.5] Out: 1650 [Urine:1650]  Intake/Output this shift: Total I/O In: -  Out: 150 [Urine:150]  Lab Results: Recent Labs    10/13/19 0839 10/13/19 1338 10/14/19 0653 10/14/19 2128 10/15/19 0603  WBC 8.7  --  8.3  --  8.8  HGB 7.3*   < > 6.7* 9.3* 9.8*  HCT 22.3*   < > 20.8* 27.7* 29.3*  PLT 211  --  213  --  271   < > = values in this interval not displayed.   BMET Recent Labs    10/13/19 0536  NA 138  K 3.4*  CL 109  CO2 24  GLUCOSE 137*  BUN 32*  CREATININE 0.67  CALCIUM 7.6*     ASSESMENT:   *   Recurrent diverticular bleed.  Last BM was > 2 d ago.  Feels well  *    Acute blood loss anemia. Hgb 13.4 >> 6.7 >> 9.3.  S/p 4 PRBCs.    *   Chronic Plavix.   Hx CVA   PLAN   *   Probably ok for home today, will defer to Dr Loletha Carrow  *   Give MiraLAX once today.  He uses this as needed and feels like he would like to have some.  *   ? Date to resume Plavix?, one week vs 2 weeks??     Azucena Freed  10/15/2019, 10:16 AM   I have discussed the case with the PA, and that is the plan I formulated. I personally interviewed and examined the patient.  Agree with plan for discharge today. Hold plavix another week. No routine GI office follow up needed.  Dr. Ardis Hughs aware of events since I discussed the case with him this week.    [name] Office: [phone]    Phone [phone]

## 2019-10-15 NOTE — Progress Notes (Signed)
Patient and wife provided with discharge instructions; including medication changes and a reminder to follow up with his primary care provider. Patient is alert and oriented and is in no distress at this time.

## 2019-10-15 NOTE — Discharge Summary (Signed)
Physician Discharge Summary  [name] SFK:[id number] DOB: 05/21/1931 DOA: 10/10/2019  PCP: Grant Carol, MD  Admit date: 10/10/2019 Discharge date: 10/15/2019  Admitted From: Home Disposition:  Home  Discharge Condition:Stable CODE STATUS:FULL Diet recommendation: Heart Healthy  Brief/Interim Summary:  [name] a 86 y.o.malewith medical history significant ofnonhemorrhagic CVA, hypertension, congestive heart failurewith EF of 45%, type 2 diabetes mellitus, recurrent GI bleed and following with [name] presented to the emergency department with multiple episodes of bloody bowel movement.Last time he was admitted on May of this year with similar complaint. At that time no intervention was done and he was transfused with a unit of PRBC. Before that, he was admitted on March of this year.He underwent a full work-up on Marchwith colonoscopy, EGD without finding of clear source of bleeding. He also underwent CT angiogram and bleeding scan, the later suggested possible right colonic source.He had known history of diverticulosis so it was presumed the cause of GI bleed.After a longdiscussion with his gastroenterologist( Dr Ardis Hughs), no further intervention in the future was planned. On presentation he washemodynamicallystable. His hemoglobin was stable in the range of 13 on presentation. GI is following.  He again had a big bloody bowel movement on the  Morning of 10/12/19 and his hemoglobin dropped to the range of 7.  CT angiogram did not show any evidence of active bleeding.Hb dropped to 6.7 on 10/14/19 and he was transfused with 2 units of PRBC.  This morning he is hemoglobin is in the range of 9.  He does not have a bloody bowel movement in last 48 hours.  He is medically stable for discharge to home today.  Since he has recurrent diverticular bleedings, we suspect that he has high risk of readmission in the near future with another  diverticular bleed.  Following problems  were addressed during hospitalization:  Acute on chronic GI bleed:Presented with bloody bowels at home, had another big bloody bowel movement in the emergency department.  Became diaphoretic after the bloody bowel movement in the emergency department and was given IV fluids and unit of PRBC. We will continue to monitor H&H. GI consulted. Patient prefers not to do any intervention on this admission. Last time he was admitted on [place] this year with similar complaint. He had known history of diverticulosis so it was presumed the cause of GI bleed. He underwent a full work-up on last admission with colonoscopy, EGD without finding of clear source of bleeding. He also underwent CT angiogram and bleeding scan, the latter suggested possible right colonic source. He was again admitted on May 2021 for similar complaints at the time work-up was not done.This could be fromdiverticular bleed but we have started on Protonix 40 mg IV twice daily for now.  GI is following.  He again had a big bloody bowel movement on morning of 10/12/19 .  Hemoglobin dropped to the range of 7.5.  CT angiogram did not show any active bleeding. Hb dropped to 6.7 on 10/14/19 and he was transfused with 2 units of PRBC.  This morning he is hemoglobin is in the range of 9.  He does not have a bloody bowel movement in last 48 hours.  He is medically stable for discharge to home today.  Since he has recurrent diverticular bleedings, we suspect that he has high risk of readmission in the near future with another  diverticular bleed.  Nonhemorrhagic CVA:He said he hasleft-sided residual weakness. Ambulates with the help of walker. Takes Plavix for the  history of stroke. Plavix on hold.  Will recommend to resume Plavix in a week.  Chronic systolic congestive heart failure:Last echocardiogram showed ejection fraction 45%. Euvolemic on presentation. Does not take any diuretics at home.  Diabetes type 2: Continue home  regimen.  History of restless leg syndrome: On ropinirole.  Hypertension: Blood pressure is well controlled.  Not on any medications at home.  HLD: Takes simvastatin      Discharge Diagnoses:  Principal Problem:   GI bleed Active Problems:   Diabetes mellitus type II, non insulin dependent (HCC)   HLD (hyperlipidemia)   Chronic anticoagulation 2/2 history of CVA   Chronic combined systolic and diastolic CHF (congestive heart failure) [facility])    Discharge Instructions  Discharge Instructions    Diet - low sodium heart healthy   Complete by: As directed    Discharge instructions   Complete by: As directed    1)Please follow-up with your PCP in a week.  Do a CBC test during the follow-up. 2)Restart taking plavix only after a week 3)If you have rectal bleeding, come to the emergency department. 4)Take soft diet for next 2-3 days   Increase activity slowly   Complete by: As directed      Allergies as of 10/15/2019      Reactions   Tape Other (See Comments)   SKIN IS SENSITIVE; PLEASE USE PAPER TAPE; SKIN BRUISES AND TEARS EASILY!!   Azithromycin Rash, Other (See Comments)   Pt had a Z-Pak Jan, 2020 - no reaction(??)      Medication List    STOP taking these medications   carvedilol 3.125 MG tablet Commonly known as: COREG   Gas-X Prevention Caps     TAKE these medications   ProAir HFA 108 (90 Base) MCG/ACT inhaler Generic drug: albuterol Inhale 2 puffs into the lungs every 6 (six) hours as needed for wheezing or shortness of breath.   albuterol (2.5 MG/3ML) 0.083% nebulizer solution Commonly known as: PROVENTIL Take 3 mLs by nebulization every 6 (six) hours as needed for wheezing or shortness of breath.   budesonide 0.5 MG/2ML nebulizer solution Commonly known as: PULMICORT Take 2 mLs by nebulization 2 (two) times daily.   clopidogrel 75 MG tablet Commonly known as: PLAVIX Take 1 tablet (75 mg total) by mouth daily. Start only after a week What  changed: additional instructions   diphenhydramine-acetaminophen 25-500 MG Tabs tablet Commonly known as: TYLENOL PM Take 1 tablet by mouth at bedtime.   ferrous sulfate 325 (65 FE) MG tablet Take 1 tablet (325 mg total) by mouth daily.   fluticasone 50 MCG/ACT nasal spray Commonly known as: FLONASE Place 1 spray into both nostrils daily as needed for allergies or rhinitis.   Melatonin 10 MG Tabs Take 10 mg by mouth at bedtime as needed (for sleep).   metFORMIN 500 MG tablet Commonly known as: GLUCOPHAGE Take 500 mg by mouth daily.   montelukast 10 MG tablet Commonly known as: SINGULAIR Take 10 mg by mouth at bedtime as needed (for breathing flares).   pantoprazole 40 MG tablet Commonly known as: PROTONIX Take 1 tablet (40 mg total) by mouth 2 (two) times daily.   polyethylene glycol 17 g packet Commonly known as: MIRALAX / GLYCOLAX Take 17 g by mouth daily as needed for mild constipation (MIX AND DRINK).   predniSONE 10 MG tablet Commonly known as: DELTASONE Take 10 mg by mouth daily.   psyllium 58.6 % powder Commonly known as: METAMUCIL Take 1 packet by  mouth daily as needed (for constipation- MIX AND DRINK).   rOPINIRole 1 MG tablet Commonly known as: REQUIP Take 2 mg by mouth at bedtime.   simvastatin 40 MG tablet Commonly known as: ZOCOR Take 1 tablet (40 mg total) by mouth daily. What changed: when to take this   traMADol 50 MG tablet Commonly known as: ULTRAM Take 50 mg by mouth as needed for pain.       Follow-up Information    Grant Carol, MD. Schedule an appointment as soon as possible for a visit in 1 week(s).   Specialty: Internal Medicine Contact information: [address] [place] [address] [phone]              Allergies  Allergen Reactions  . Tape Other (See Comments)    SKIN IS SENSITIVE; PLEASE USE PAPER TAPE; SKIN BRUISES AND TEARS EASILY!!  . Azithromycin Rash and Other (See Comments)    Pt had a  Z-Pak Jan, 2020 - no reaction(??)    Consultations:  Gastroenterology   Procedures/Studies: CT Angio Abd/Pel w/ and/or w/o  Result Date: 10/12/2019 CLINICAL DATA:  86 year old with GI bleeding. History of splenectomy. EXAM: CTA ABDOMEN AND PELVIS WITHOUT AND WITH CONTRAST TECHNIQUE: Multidetector CT imaging of the abdomen and pelvis was performed using the standard protocol during bolus administration of intravenous contrast. Multiplanar reconstructed images and MIPs were obtained and reviewed to evaluate the vascular anatomy. CONTRAST:  149mL OMNIPAQUE IOHEXOL 350 MG/ML SOLN COMPARISON:  CT 8 04/01/2019 FINDINGS: VASCULAR Aorta: Atherosclerotic disease in the abdominal aorta with aneurysm or dissection. Celiac: Celiac trunk is widely patent. Ligation of the proximal splenic artery compatible with history of splenectomy. Common hepatic artery and left gastric artery are patent. SMA: Patent without evidence of aneurysm, dissection, vasculitis or significant stenosis. Renals: Both renal arteries are patent without evidence of aneurysm, dissection, vasculitis, fibromuscular dysplasia or significant stenosis. IMA: Patent without evidence of aneurysm, dissection, vasculitis or significant stenosis. Inflow: Distal right common iliac artery measures 1.8 cm and stable. Common, internal and external iliac arteries are patent bilaterally without significant stenosis. Proximal Outflow: Proximal femoral arteries are patent bilaterally. Veins: Portal venous system is patent. IVC and renal veins are patent. Iliac veins are patent. Review of the MIP images confirms the above findings. NON-VASCULAR Lower chest: Lung bases are clear. Hepatobiliary: Normal appearance of the liver and gallbladder. No biliary dilatation. Pancreas: Unremarkable. No pancreatic ductal dilatation or surrounding inflammatory changes. Spleen: Status post splenectomy. Again noted is a small amount of soft tissue just posterior to the spleen on  sequence 10, image 23 that measures up to 2.1 cm. This is unchanged from the previous examination and could represent residual splenic tissue or a splenule. Difficult to exclude a small amount of residual splenic tissue near the pancreatic tail which is also stable. Adrenals/Urinary Tract: Normal adrenal glands. Normal appearance of the urinary bladder. No hydronephrosis. There is probably a hyperdense cyst in the mid right kidney on sequence 10 image 34 that measures up to 1.0 cm and minimally changed from the previous examination. At least 1 small exophytic cyst in the right kidney. Left renal cysts. Stomach/Bowel: Hiatal hernia containing a small amount of oral contrast. Evidence for postsurgical changes associated with the hiatal hernia. No evidence for contrast extravasation or active GI bleeding. No evidence for focal bowel inflammation. Cecum is located in the anterior right abdomen. Extensive diverticulosis involving the sigmoid colon. Again noted is a round fat attenuating lesion in the left abdomen  adjacent to loops of small bowel. This structure is seen on sequence 10, image 47 and measures up to 2.0 cm. There was a similar finding on the previous examination. Lymphatic: No abdominal or pelvic lymphadenopathy. Reproductive: Prostate contains multiple calcifications. Other: Negative for ascites. Negative for free air. Surgical mesh in the upper anterior abdomen. Tiny ventral hernia containing fat in the periumbilical region. Musculoskeletal: No acute bone abnormality. Joint space narrowing involving right hip joint. Multilevel degenerative facet disease in lumbar spine. IMPRESSION: VASCULAR 1. Atherosclerotic disease in the abdominal aorta without aneurysm or dissection. Aortic Atherosclerosis (ICD10-I70.0). 2. Main visceral arteries are patent. Splenic artery is ligated and related to the splenectomy. 3. Stable ectasia involving the distal right common iliac artery measuring up to 1.8 cm. NON-VASCULAR 1.  No evidence for active GI bleeding. 2. Colonic diverticulosis. 3. Postsurgical changes compatible with splenectomy. Suspect a small amount of residual splenic tissue near the stomach as described. 4. Hiatal hernia with postsurgical changes. Small amount of oral contrast within the hiatal hernia limits evaluation for bleeding in this area. 5. Bilateral renal cysts. 6. Probable small lipoma adjacent to small bowel loops in left abdomen. This is a stable finding. Electronically Signed   By: Markus Daft M.D.   On: 10/12/2019 17:32       Subjective: Patient seen and examined the bedside this morning.  Medically stable for discharge today.  Discharge Exam: Vitals:   10/15/19 0526 10/15/19 0839  BP: 139/88   Pulse: 92   Resp: 18   Temp: 98 F (36.7 C)   SpO2: 99% 96%   Vitals:   10/14/19 1840 10/14/19 2022 10/15/19 0526 10/15/19 0839  BP: 133/67  139/88   Pulse: 83  92   Resp: 18  18   Temp: 97.7 F (36.5 C)  98 F (36.7 C)   TempSrc: Oral  Oral   SpO2: 98% 98% 99% 96%  Weight:      Height:        General: Pt is alert, awake, not in acute distress Cardiovascular: RRR, S1/S2 +, no rubs, no gallops Respiratory: CTA bilaterally, no wheezing, no rhonchi Abdominal: Soft, NT, ND, bowel sounds + Extremities: no edema, no cyanosis    The results of significant diagnostics from this hospitalization (including imaging, microbiology, ancillary and laboratory) are listed below for reference.     Microbiology: Recent Results (from the past 240 hour(s))  SARS Coronavirus 2 by RT PCR (hospital order, performed in [facility] hospital lab) Nasopharyngeal Nasopharyngeal Swab     Status: None   Collection Time: 10/10/19  5:38 PM   Specimen: Nasopharyngeal Swab  Result Value Ref Range Status   SARS Coronavirus 2 NEGATIVE NEGATIVE Final    Comment: (NOTE) SARS-CoV-2 target nucleic acids are NOT DETECTED.  The SARS-CoV-2 RNA is generally detectable in upper and lower respiratory specimens  during the acute phase of infection. The lowest concentration of SARS-CoV-2 viral copies this assay can detect is 250 copies / mL. A negative result does not preclude SARS-CoV-2 infection and should not be used as the sole basis for treatment or other patient management decisions.  A negative result may occur with improper specimen collection / handling, submission of specimen other than nasopharyngeal swab, presence of viral mutation(s) within the areas targeted by this assay, and inadequate number of viral copies (<250 copies / mL). A negative result must be combined with clinical observations, patient history, and epidemiological information.  Fact Sheet for Patients:   StrictlyIdeas.no  Fact Sheet  for Healthcare Providers: BankingDealers.co.za  This test is not yet approved or  cleared by the [place] and has been authorized for detection and/or diagnosis of SARS-CoV-2 by FDA under an Emergency Use Authorization (EUA).  This EUA will remain in effect (meaning this test can be used) for the duration of the COVID-19 declaration under Section 564(b)(1) of the Act, 21 U.S.C. section 360bbb-3(b)(1), unless the authorization is terminated or revoked sooner.  Performed at [facility], [place] [address], [place], [place] [address]      Labs: BNP (last 3 results) No results for input(s): BNP in the last 8760 hours. Basic Metabolic Panel: Recent Labs  Lab 10/10/19 1410 10/11/19 0525 10/13/19 0536  NA 135 136 138  K 4.9 4.1 3.4*  CL 101 105 109  CO2 26 23 24   GLUCOSE 164* 147* 137*  BUN 18 27* 32*  CREATININE 0.71 0.62 0.67  CALCIUM 8.6* 8.1* 7.6*   Liver Function Tests: Recent Labs  Lab 10/10/19 1410  AST 13*  ALT 10  ALKPHOS 56  BILITOT 0.5  PROT 5.7*  ALBUMIN 3.1*   Recent Labs  Lab 10/10/19 1410  LIPASE 20   No results for input(s): AMMONIA in the last 168 hours. CBC: Recent Labs   Lab 10/10/19 1540 10/10/19 2138 10/11/19 0525 10/11/19 0948 10/12/19 0659 10/12/19 1218 10/13/19 0839 10/13/19 1338 10/14/19 0653 10/14/19 2128 10/15/19 0603  WBC 10.0  --  11.4*  --  11.8*  --  8.7  --  8.3  --  8.8  NEUTROABS 7.0  --   --   --  8.6*  --  5.1  --  4.6  --  4.9  HGB 12.1*   < > 10.7*   < > 8.1*   < > 7.3* 8.3* 6.7* 9.3* 9.8*  HCT 38.0*   < > 33.3*   < > 25.6*   < > 22.3* 25.1* 20.8* 27.7* 29.3*  MCV 97.7  --  97.9  --  98.5  --  97.4  --  99.0  --  96.7  PLT 315  --  208  --  212  --  211  --  213  --  271   < > = values in this interval not displayed.   Cardiac Enzymes: No results for input(s): CKTOTAL, CKMB, CKMBINDEX, TROPONINI in the last 168 hours. BNP: Invalid input(s): POCBNP CBG: Recent Labs  Lab 10/14/19 0823 10/14/19 1225 10/14/19 1749 10/14/19 2249 10/15/19 0743  GLUCAP 125* 137* 126* 126* 115*   D-Dimer No results for input(s): DDIMER in the last 72 hours. Hgb A1c No results for input(s): HGBA1C in the last 72 hours. Lipid Profile No results for input(s): CHOL, HDL, LDLCALC, TRIG, CHOLHDL, LDLDIRECT in the last 72 hours. Thyroid function studies No results for input(s): TSH, T4TOTAL, T3FREE, THYROIDAB in the last 72 hours.  Invalid input(s): FREET3 Anemia work up No results for input(s): VITAMINB12, FOLATE, FERRITIN, TIBC, IRON, RETICCTPCT in the last 72 hours. Urinalysis    Component Value Date/Time   COLORURINE STRAW (A) 04/21/2018 2320   APPEARANCEUR CLEAR 04/21/2018 2320   LABSPEC 1.013 04/21/2018 2320   PHURINE 6.0 04/21/2018 2320   GLUCOSEU NEGATIVE 04/21/2018 2320   HGBUR NEGATIVE 04/21/2018 2320   BILIRUBINUR NEGATIVE 04/21/2018 2320   KETONESUR NEGATIVE 04/21/2018 2320   PROTEINUR NEGATIVE 04/21/2018 2320   UROBILINOGEN 0.2 12/22/2013 1210   NITRITE NEGATIVE 04/21/2018 2320   LEUKOCYTESUR NEGATIVE 04/21/2018 2320   Sepsis Labs Invalid input(s):  PROCALCITONIN,  WBC,  LACTICIDVEN Microbiology Recent Results (from  the past 240 hour(s))  SARS Coronavirus 2 by RT PCR (hospital order, performed in [facility] hospital lab) Nasopharyngeal Nasopharyngeal Swab     Status: None   Collection Time: 10/10/19  5:38 PM   Specimen: Nasopharyngeal Swab  Result Value Ref Range Status   SARS Coronavirus 2 NEGATIVE NEGATIVE Final    Comment: (NOTE) SARS-CoV-2 target nucleic acids are NOT DETECTED.  The SARS-CoV-2 RNA is generally detectable in upper and lower respiratory specimens during the acute phase of infection. The lowest concentration of SARS-CoV-2 viral copies this assay can detect is 250 copies / mL. A negative result does not preclude SARS-CoV-2 infection and should not be used as the sole basis for treatment or other patient management decisions.  A negative result may occur with improper specimen collection / handling, submission of specimen other than nasopharyngeal swab, presence of viral mutation(s) within the areas targeted by this assay, and inadequate number of viral copies (<250 copies / mL). A negative result must be combined with clinical observations, patient history, and epidemiological information.  Fact Sheet for Patients:   StrictlyIdeas.no  Fact Sheet for Healthcare Providers: BankingDealers.co.za  This test is not yet approved or  cleared by the [place] FDA and has been authorized for detection and/or diagnosis of SARS-CoV-2 by FDA under an Emergency Use Authorization (EUA).  This EUA will remain in effect (meaning this test can be used) for the duration of the COVID-19 declaration under Section 564(b)(1) of the Act, 21 U.S.C. section 360bbb-3(b)(1), unless the authorization is terminated or revoked sooner.  Performed at [facility], [place] [address] [place], [place] [address]     Please note: You were cared for by a hospitalist during your hospital stay. Once you are discharged, your primary care  physician will handle any further medical issues. Please note that NO REFILLS for any discharge medications will be authorized once you are discharged, as it is imperative that you return to your primary care physician (or establish a relationship with a primary care physician if you do not have one) for your post hospital discharge needs so that they can reassess your need for medications and monitor your lab values.    Time coordinating discharge: 40 minutes  SIGNED:   Shelly Coss, MD  [facility] 10/15/2019, 11:33 AM Pager [phone]  If 7PM-7AM, please contact night-coverage [email or web address] Password [email or web address]

## 2019-10-26 ENCOUNTER — Encounter: Payer: Self-pay | Admitting: Adult Health

## 2019-10-26 ENCOUNTER — Ambulatory Visit (INDEPENDENT_AMBULATORY_CARE_PROVIDER_SITE_OTHER): Payer: Medicare Other | Admitting: Adult Health

## 2019-10-26 VITALS — BP 120/73 | HR 93 | Ht 70.0 in | Wt 154.8 lb

## 2019-10-26 DIAGNOSIS — G8114 Spastic hemiplegia affecting left nondominant side: Secondary | ICD-10-CM | POA: Diagnosis not present

## 2019-10-26 DIAGNOSIS — Z8673 Personal history of transient ischemic attack (TIA), and cerebral infarction without residual deficits: Secondary | ICD-10-CM

## 2019-10-26 MED ORDER — ROPINIROLE HCL 3 MG PO TABS
3.0000 mg | ORAL_TABLET | Freq: Every day | ORAL | 3 refills | Status: DC
Start: 2019-10-26 — End: 2020-12-09

## 2019-10-26 NOTE — Progress Notes (Signed)
[facility]  PATIENT: [name] DOB: 1931-12-10   REASON FOR VISIT:  F/u regarding prior strokes and TIAs HISTORY FROM: Patient and wife    HISTORY OF PRESENT ILLNESS:   Update 10/26/2019 JM:   [name] returns for follow-up regarding history of chronic strokes, TIAs and TIA versus seizure episode He is accompanied by his wife  He has been stable from a neurological standpoint since prior visit without new or reoccurring stroke/TIA symptoms or seizure type symptoms. Residual stroke deficits of left spastic hemiparesis which has been stable not worsening Currently using Requip 2 mg nightly for LLE "jerking" previously working well but more recently he has noticed worsening symptoms and is requesting increased Requip dosage Continues to use AFO brace and Rollator walker at all times and denies any recent falls  has had ED admissions since prior visit for acute on chronic GI bleed due to diverticulosis requiring transfusions with most recent admission 10/10/2019.  Recommended to hold Plavix for 1 week.  He has since restarted Plavix without evidence of reoccurring bleeding and denies bruising Continues on simvastatin without myalgias Blood pressure today 120/73 Continues to follow closely with PCP for HTN, HLD and DM management  No further concerns     History provided for reference purposes only UPDATE 07/22/2016.CM [name], 86 year old male returns for follow-up with possible TIA event over the weekend where he had slurred speech and right hand numbness for 30 minutes. He is currently on aspirin 325 mg daily. He is on Zocor for hyperlipidemia and is due to have labs next week his primary care. Blood pressure in the office today 149/78 he is continuing to get physical therapy 2 times a week for left lower extremity weakness since admission for stroke and 02/22/2016 . Carotid Doppler at that time 1-39% bilateral ICA stenosis negative MRA of the head MRI with  right lateral thalamus and right posterior limb internal capsule infarct. He had another admission to the ER on 05/04/2016 for aphasia/ TIA. MRI without acute infarct. Marland Kitchen He lives in independent living at [address]. He returns for reevaluation   HISTORY 07/2015 Dr. Cyndra Numbers had acute onset of dizziness on 07/25/15 and was admitted to [facility]. MRI showed tiny acute infarct in the superior left occipital lobe. CTA head and neck unremarkable. EF 45-50%, A1C 6.5, LDL not checked. His investigational meds were discontinued. He was put on plavix. Further embolic work up deferred as he would like to be discharged the 2nd day.   Discussed with pt and his wife that he had recurrent episodes of word finding difficulty. MRI on 12/22/13 showed acute infarct at left temporal cortical region. He was enrolled to RESPECT ESUS trial to compare ASA vs. pradaxa in 04/2014. However, he had another TIA episode with word finding difficulty and MRI negative. He was managed to be remained in the trial. This time his symptoms are different from previous, but MRI showed acute tiny infarct at left occipital lobe. He had 30 day cardiac monitoring in the past with Dr. Wynonia Lawman was told negative for afib. During to recurrent episodes and embolic pattern, we can either put him on coumadin and INR 2-3 or continue plavix and check TEE and consider loop recorder. Pros and cons of either approach have been discussed with pt and wife. Pt more leaning towards coumadin and wife leaning toward the other. They would like to further discuss with PCP Dr. Delfina Redwood and let me know. I will forward the note to Dr. Delfina Redwood today  and they will call him next week.   Update 11/25/2016 Dr. Leonie Man :  He returns for follow-up after last visit 4 months ago. He is accompanied by his wife. His abnormal further recurrent TIA or stroke symptoms. He had to stop Plavix for a week as he had some rectal bleed which was felt to be diverticular bleed. He has since resumed Plavix and is  doing all right without further bleeding problems. He continues to walk with a walker due to stiffness and weakness in left leg as well as left knee pain which is bothersome. He recently got intra-articular injection by Dr.: Into his left knee. Uses a wheeled walker regularly. He states is careful and has not had any recent falls. He is complaining of mild short-term memory difficulties as well as double finding words and completing sentences. He does not participate in any mentally challenging activities. Update the 06/09/2017 Dr. Leonie Man :he returns for follow-up after last for this 6 months ago. He is accompanied by his wife. He has not had any recurrent stroke or TIA symptoms. He continues to have left leg weakness and difficulty with gait and balance. He does use a wheeled walker all the time. He has not had any falls or injuries recently. He states his memory difficulties unchanged and are not progressive. His remains on Plavix which is tolerating well with only minor bruising but no bleeding. States his blood pressure is under good control today it is 124/72. He is tolerating Zocor well and last lipid profile checked for satisfactory by his primary physician. He was diagnosed with pneumonia 2 weeks ago by his primary physician and given a course of antibiotics and is now improving. Update 09/28/2018 Dr. Leonie Man : He returns for follow-up after last visit with me in April 2019.  He is accompanied by his wife.  He had an episode of possible TIA versus seizure in March 2020.  Patient had been admitted for diverticular bleed to the hospital and had been off Plavix for 5 days.  After the patient was discharged home in the same evening the wife noticed that he had speech difficulties and was unable to speak or verbalize he had was able unable to respond to the wife and a blank look on his face.  She called 911 patient went back to the hospital this episode of speech difficulty lasted 10 to 20 minutes.  Work-up in the  emergency room included a CT scan without contrast which did not show acute abnormality.  CT angiogram of the head and neck was obtained which did not show any major stenosis.  MRI scan of the brain was also obtained which showed old basal ganglia infarct without any acute infarct and changes of small vessel disease and generalized atrophy.  Patient has done well since discharge has had no further recurrent episodes of speech disturbance, TIA or seizures.  Is tolerating Plavix with only minor bruising.  His blood pressures well controlled today 121/62.  His last hemoglobin A1c was 6.6 checked 2 months ago.  He remains on Zocor which is tolerating well without myalgias but does have some knee and foot pain.  Continues to have gait and balance difficulties.  He does use a wheeled walker and states he is careful and has not had any major falls or injuries.  He is having dysphagia and plans to have elective esophageal dilatation to be done by Dr. Ardis Hughs on 10/07/2018 and wants neurological clearance and to hold Plavix for the procedure.  REVIEW OF SYSTEMS: Full 14 system review of systems performed and notable only for those listed in HPI, all others are neg   ALLERGIES: Allergies  Allergen Reactions  . Tape Other (See Comments)    SKIN IS SENSITIVE; PLEASE USE PAPER TAPE; SKIN BRUISES AND TEARS EASILY!!  . Azithromycin Rash and Other (See Comments)    Pt had a Z-Pak Jan, 2020 - no reaction(??)    HOME MEDICATIONS: Outpatient Medications Prior to Visit  Medication Sig Dispense Refill  . albuterol (PROAIR HFA) 108 (90 Base) MCG/ACT inhaler Inhale 2 puffs into the lungs every 6 (six) hours as needed for wheezing or shortness of breath.    Marland Kitchen albuterol (PROVENTIL) (2.5 MG/3ML) 0.083% nebulizer solution Take 3 mLs by nebulization every 6 (six) hours as needed for wheezing or shortness of breath.     . budesonide (PULMICORT) 0.5 MG/2ML nebulizer solution Take 2 mLs by nebulization 2 (two) times  daily.    . clopidogrel (PLAVIX) 75 MG tablet Take 1 tablet (75 mg total) by mouth daily. Start only after a week    . diphenhydramine-acetaminophen (TYLENOL PM) 25-500 MG TABS tablet Take 1 tablet by mouth at bedtime.     . ferrous sulfate 325 (65 FE) MG tablet Take 1 tablet (325 mg total) by mouth daily.    . fluticasone (FLONASE) 50 MCG/ACT nasal spray Place 1 spray into both nostrils daily as needed for allergies or rhinitis.   5  . Melatonin 10 MG TABS Take 10 mg by mouth at bedtime as needed (for sleep).     . metFORMIN (GLUCOPHAGE) 500 MG tablet Take 500 mg by mouth daily.    . montelukast (SINGULAIR) 10 MG tablet Take 10 mg by mouth at bedtime as needed (for breathing flares).     . polyethylene glycol (MIRALAX / GLYCOLAX) packet Take 17 g by mouth daily as needed for mild constipation (MIX AND DRINK).     . predniSONE (DELTASONE) 10 MG tablet Take 10 mg by mouth daily.    . psyllium (METAMUCIL) 58.6 % powder Take 1 packet by mouth daily as needed (for constipation- MIX AND DRINK).     Marland Kitchen rOPINIRole (REQUIP) 1 MG tablet Take 2 mg by mouth at bedtime.    . simvastatin (ZOCOR) 40 MG tablet Take 1 tablet (40 mg total) by mouth daily. (Patient taking differently: Take 40 mg by mouth at bedtime. ) 30 tablet 3  . traMADol (ULTRAM) 50 MG tablet Take 50 mg by mouth as needed for pain.    . pantoprazole (PROTONIX) 40 MG tablet Take 1 tablet (40 mg total) by mouth 2 (two) times daily. 120 tablet 0   No facility-administered medications prior to visit.    PAST MEDICAL HISTORY: Past Medical History:  Diagnosis Date  . Acute CVA (cerebrovascular accident) ([place]) 07/25/2015  . Asthma   . Benign essential HTN   . Cerebral infarction due to embolism of left middle cerebral artery ([place]) 02/03/2014  . Chronic combined systolic and diastolic CHF (congestive heart failure) ([place]) 12/23/2017  . Colitis   . Colon polyps   . Diabetes mellitus type 2, diet-controlled ([place]) 12/22/2013  . Diverticulosis   .  GERD (gastroesophageal reflux disease)   . GI bleed   . Hemorrhoids   . Hiatal hernia   . History of esophageal strciture   . HLD (hyperlipidemia)   . Osteoarthritis   . Proctitis   . Status post dilation of esophageal narrowing   . Stroke [facility])  PAST SURGICAL HISTORY: Past Surgical History:  Procedure Laterality Date  . ABDOMINAL HERNIA REPAIR    . BIOPSY  04/05/2019   Procedure: BIOPSY;  Surgeon: Thornton Hackleman, MD;  Location: [place];  Service: Gastroenterology;;  . COLONOSCOPY     multiple  . COLONOSCOPY WITH PROPOFOL N/A 12/12/2016   Procedure: COLONOSCOPY WITH PROPOFOL;  Surgeon: Gatha Mayer, MD;  Location: [facility] ENDOSCOPY;  Service: Endoscopy;  Laterality: N/A;  . COLONOSCOPY WITH PROPOFOL N/A 04/04/2019   Procedure: COLONOSCOPY WITH PROPOFOL;  Surgeon: Milus Banister, MD;  Location: [facility] ENDOSCOPY;  Service: Endoscopy;  Laterality: N/A;  . ESOPHAGOGASTRODUODENOSCOPY     multiple  . ESOPHAGOGASTRODUODENOSCOPY (EGD) WITH PROPOFOL N/A 04/05/2019   Procedure: ESOPHAGOGASTRODUODENOSCOPY (EGD) WITH PROPOFOL;  Surgeon: Thornton Sons, MD;  Location: [place];  Service: Gastroenterology;  Laterality: N/A;  . INSERTION OF MESH  01/29/2012   Procedure: INSERTION OF MESH;  Surgeon: Gwenyth Ober, MD;  Location: [place];  Service: General;  Laterality: N/A;  . IR ANGIOGRAM SELECTIVE EACH ADDITIONAL VESSEL  04/01/2019  . IR ANGIOGRAM VISCERAL SELECTIVE  04/01/2019  . IR ANGIOGRAM VISCERAL SELECTIVE  04/01/2019  . IR ANGIOGRAM VISCERAL SELECTIVE  04/01/2019  . IR US GUIDE VASC ACCESS RIGHT  04/01/2019  . NISSEN FUNDOPLICATION    . SPLENECTOMY, TOTAL     nontraumatic rupture  . VENTRAL HERNIA REPAIR  01/29/2012    WITH MESH  . VENTRAL HERNIA REPAIR  01/29/2012   Procedure: HERNIA REPAIR VENTRAL ADULT;  Surgeon: Gwenyth Ober, MD;  Location: [place];  Service: General;  Laterality: N/A;  open recurrent ventral hernia repair with mesh    FAMILY HISTORY: Family History   Problem Relation Age of Onset  . Alzheimer's disease Mother   . Breast cancer Mother   . Throat cancer Father   . Colon cancer Neg Hx   . Colon polyps Neg Hx   . Pancreatic cancer Neg Hx   . Stomach cancer Neg Hx   . Liver disease Neg Hx     SOCIAL HISTORY: Social History   Socioeconomic History  . Marital status: Married    Spouse name: [name]  . Number of children: 2  . Years of education: Bachelor's  . Highest education level: Not on file  Occupational History  . Occupation: retired  Tobacco Use  . Smoking status: Never Smoker  . Smokeless tobacco: Never Used  Substance and Sexual Activity  . Alcohol use: Yes    Alcohol/week: 2.0 standard drinks    Types: 2 Glasses of wine per week    Comment: daily  . Drug use: No  . Sexual activity: Not on file  Other Topics Concern  . Not on file  Social History Narrative   Patient is married and has 2 children.   Patient is right handed.   Patient has Bachelor's degree.   Patient drinks 2 cups daily.   Social Determinants of Health   Financial Resource Strain:   . Difficulty of Paying Living Expenses: Not on file  Food Insecurity:   . Worried About Charity fundraiser in the Last Year: Not on file  . Ran Out of Food in the Last Year: Not on file  Transportation Needs:   . Lack of Transportation (Medical): Not on file  . Lack of Transportation (Non-Medical): Not on file  Physical Activity:   . Days of Exercise per Week: Not on file  . Minutes of Exercise per Session: Not on file  Stress:   .  Feeling of Stress : Not on file  Social Connections:   . Frequency of Communication with Friends and Family: Not on file  . Frequency of Social Gatherings with Friends and Family: Not on file  . Attends Religious Services: Not on file  . Active Member of Clubs or Organizations: Not on file  . Attends Archivist Meetings: Not on file  . Marital Status: Not on file  Intimate Partner Violence:   . Fear of Current or  Ex-Partner: Not on file  . Emotionally Abused: Not on file  . Physically Abused: Not on file  . Sexually Abused: Not on file     PHYSICAL EXAM  Vitals:   10/26/19 1459  BP: 120/73  Pulse: 93  Weight: 154 lb 12.8 oz (70.2 kg)  Height: 5\' 10"  (1.778 m)   Body mass index is 22.21 kg/m.  Generalized: frail pleasant elderly Caucasian male, in no acute distress  Head: normocephalic and atraumatic,.   Neck: Supple, no carotid bruits  Cardiac: Regular rate rhythm, no murmur  Musculoskeletal: No deformity . Mild kyphosis  Neurological examination   Mentation: Alert oriented to time, place, history taking. Attention span and concentration appropriate. Recent and remote memory intact.  Follows all commands speech and language fluent. Diminished recall   Cranial nerve II-XII: Pupils were equal round reactive to light extraocular movements were full, visual field were full on confrontational test. Facial sensation and strength were normal. hearing was intact to finger rubbing bilaterally. Uvula tongue midline. head turning and shoulder shrug were normal and symmetric.Tongue protrusion into cheek strength was normal. Motor: full strength right upper and lower extremity with normal bulk and tone, Mild LUE grip weakness and intrinsic hand muscles. Orbits right over left approximately. Fine finger movements are diminished on the left. LLE 4/5 hip flexor and 3/5 ankle dorsiflexion weakness with foot drop and ankle instability and AFO brace in place and increased spasticity Sensory: normal and symmetric to light touch, in the upper and lower extremities Coordination: finger-nose-finger performed accurately on right side Reflexes: Brisker in the left upper and lower, plantar responses were flexor bilaterally. Gait and Station: Rising up from seated position with push off.uses a wheeled walker with a steppage type gait   DIAGNOSTIC DATA (LABS, IMAGING, TESTING) - I reviewed patient records, labs,  notes, testing and imaging myself where available.  Lab Results  Component Value Date   WBC 8.8 10/15/2019   HGB 9.8 (L) 10/15/2019   HCT 29.3 (L) 10/15/2019   MCV 96.7 10/15/2019   PLT 271 10/15/2019      Component Value Date/Time   NA 138 10/13/2019 0536   K 3.4 (L) 10/13/2019 0536   CL 109 10/13/2019 0536   CO2 24 10/13/2019 0536   GLUCOSE 137 (H) 10/13/2019 0536   BUN 32 (H) 10/13/2019 0536   CREATININE 0.67 10/13/2019 0536   CREATININE 0.74 02/08/2012 1209   CALCIUM 7.6 (L) 10/13/2019 0536   PROT 5.7 (L) 10/10/2019 1410   ALBUMIN 3.1 (L) 10/10/2019 1410   AST 13 (L) 10/10/2019 1410   ALT 10 10/10/2019 1410   ALKPHOS 56 10/10/2019 1410   BILITOT 0.5 10/10/2019 1410   GFRNONAA >60 10/13/2019 0536   GFRAA >60 10/13/2019 0536   Lab Results  Component Value Date   CHOL 111 04/24/2018   HDL 44 04/24/2018   LDLCALC 54 04/24/2018   TRIG 65 04/24/2018   CHOLHDL 2.5 04/24/2018   Lab Results  Component Value Date   HGBA1C 5.9 (H)  04/24/2018     ASSESSMENT AND PLAN  86 y.o. year old male  has a past medical history of Acute CVA (cerebrovascular accident) ([place]) (07/25/2015);  Benign essential HTN; Cerebral infarction due to embolism of left middle cerebral artery ([facility]) (02/03/2014);  HLD (hyperlipidemia); Osteoarthritis;  and Stroke ([place]).TIA in May 2018.He has residual gait and balance difficulties. Mild short-term memory and speech difficulties due to age-appropriate mild cognitive impairment which appears stable recent episode of TIA versus seizure in March 2020.   -Continue  Plavix 75 mg daily  and simvastatin for secondary stroke prevention  -Increase Requip from 2 mg to 3 mg nightly which was previously initiated for LLE jerking type movements previously providing benefit.  Discussed possible need of transitioning to tizanidine nightly as symptoms likely related to LLE spasticity and not specifically RLS -Continue close PCP follow-up for aggressive stroke risk factor  management -maintain strict control of hypertension with blood pressure goal below 130/90, diabetes with hemoglobin A1c goal below 7.0% and lipids with LDL cholesterol goal below 70 mg/dL    Follow-up in 1 year or call earlier if needed  I spent 20 minutes of face-to-face and non-face-to-face time with patient and wife.  This included previsit chart review, lab review, study review, order entry, electronic health record documentation, patient education regarding prior strokes with residual deficits, importance of managing stroke risk factors, and answered all questions to patient and wife satisfaction  Frann Rider, AGNP-BC  [facility] [address] [place], [place] [address]  Phone [phone] Fax [phone] Note: This document was prepared with digital dictation and possible smart phrase technology. Any transcriptional errors that result from this process are unintentional.

## 2019-10-26 NOTE — Patient Instructions (Addendum)
Your Plan:  Continue Plavix and simvastatin for secondary stroke prevention  Increase Requip to 3 mg nightly -if no benefit, may consider use of tizanidine or baclofen as your symptoms are more closely related to spasticity after your stroke  Follow-up with your therapist at facility to discuss different brace options or possible benefit with use of ankle brace to increase ankle support/instability   Follow-up in 1 year or call earlier if needed     Thank you for coming to see [place] at [facility]. I hope we have been able to provide you high quality care today.  You may receive a patient satisfaction survey over the next few weeks. We would appreciate your feedback and comments so that we may continue to improve ourselves and the health of our patients.

## 2019-10-26 NOTE — Progress Notes (Signed)
I agree with the above plan 

## 2020-01-05 ENCOUNTER — Inpatient Hospital Stay (HOSPITAL_COMMUNITY)
Admission: EM | Admit: 2020-01-05 | Discharge: 2020-01-08 | DRG: 378 | Disposition: A | Discharge status: Home / Self Care | Payer: Medicare Other | Attending: Student | Admitting: Student

## 2020-01-05 ENCOUNTER — Encounter (HOSPITAL_COMMUNITY): Payer: Self-pay | Admitting: Emergency Medicine

## 2020-01-05 DIAGNOSIS — Z7951 Long term (current) use of inhaled steroids: Secondary | ICD-10-CM

## 2020-01-05 DIAGNOSIS — Z820 Family history of epilepsy and other diseases of the nervous system: Secondary | ICD-10-CM

## 2020-01-05 DIAGNOSIS — E785 Hyperlipidemia, unspecified: Secondary | ICD-10-CM | POA: Diagnosis present

## 2020-01-05 DIAGNOSIS — K922 Gastrointestinal hemorrhage, unspecified: Secondary | ICD-10-CM | POA: Diagnosis present

## 2020-01-05 DIAGNOSIS — J45909 Unspecified asthma, uncomplicated: Secondary | ICD-10-CM | POA: Diagnosis present

## 2020-01-05 DIAGNOSIS — Z66 Do not resuscitate: Secondary | ICD-10-CM | POA: Diagnosis present

## 2020-01-05 DIAGNOSIS — D62 Acute posthemorrhagic anemia: Secondary | ICD-10-CM | POA: Diagnosis present

## 2020-01-05 DIAGNOSIS — Z20822 Contact with and (suspected) exposure to covid-19: Secondary | ICD-10-CM | POA: Diagnosis present

## 2020-01-05 DIAGNOSIS — I5042 Chronic combined systolic (congestive) and diastolic (congestive) heart failure: Secondary | ICD-10-CM | POA: Diagnosis present

## 2020-01-05 DIAGNOSIS — Z79899 Other long term (current) drug therapy: Secondary | ICD-10-CM

## 2020-01-05 DIAGNOSIS — Z7952 Long term (current) use of systemic steroids: Secondary | ICD-10-CM

## 2020-01-05 DIAGNOSIS — Z7984 Long term (current) use of oral hypoglycemic drugs: Secondary | ICD-10-CM

## 2020-01-05 DIAGNOSIS — Z808 Family history of malignant neoplasm of other organs or systems: Secondary | ICD-10-CM

## 2020-01-05 DIAGNOSIS — K5791 Diverticulosis of intestine, part unspecified, without perforation or abscess with bleeding: Principal | ICD-10-CM | POA: Diagnosis present

## 2020-01-05 DIAGNOSIS — I10 Essential (primary) hypertension: Secondary | ICD-10-CM | POA: Diagnosis present

## 2020-01-05 DIAGNOSIS — Z7902 Long term (current) use of antithrombotics/antiplatelets: Secondary | ICD-10-CM

## 2020-01-05 DIAGNOSIS — G4700 Insomnia, unspecified: Secondary | ICD-10-CM | POA: Diagnosis present

## 2020-01-05 DIAGNOSIS — Z9081 Acquired absence of spleen: Secondary | ICD-10-CM

## 2020-01-05 DIAGNOSIS — R7303 Prediabetes: Secondary | ICD-10-CM | POA: Diagnosis present

## 2020-01-05 DIAGNOSIS — I110 Hypertensive heart disease with heart failure: Secondary | ICD-10-CM | POA: Diagnosis present

## 2020-01-05 DIAGNOSIS — Z803 Family history of malignant neoplasm of breast: Secondary | ICD-10-CM

## 2020-01-05 DIAGNOSIS — G2581 Restless legs syndrome: Secondary | ICD-10-CM | POA: Diagnosis present

## 2020-01-05 DIAGNOSIS — Z8673 Personal history of transient ischemic attack (TIA), and cerebral infarction without residual deficits: Secondary | ICD-10-CM

## 2020-01-05 LAB — COMPREHENSIVE METABOLIC PANEL
ALT: 11 U/L (ref 0–44)
AST: 18 U/L (ref 15–41)
Albumin: 3.6 g/dL (ref 3.5–5.0)
Alkaline Phosphatase: 62 U/L (ref 38–126)
Anion gap: 9 (ref 5–15)
BUN: 17 mg/dL (ref 8–23)
CO2: 26 mmol/L (ref 22–32)
Calcium: 8.6 mg/dL — ABNORMAL LOW (ref 8.9–10.3)
Chloride: 102 mmol/L (ref 98–111)
Creatinine, Ser: 0.79 mg/dL (ref 0.61–1.24)
GFR, Estimated: >60 mL/min (ref >60)
Glucose, Bld: 135 mg/dL — ABNORMAL HIGH (ref 70–99)
Potassium: 4.3 mmol/L (ref 3.5–5.1)
Sodium: 137 mmol/L (ref 135–145)
Total Bilirubin: 0.4 mg/dL (ref 0.3–1.2)
Total Protein: 6.2 g/dL — ABNORMAL LOW (ref 6.5–8.1)

## 2020-01-05 LAB — CBC WITH DIFFERENTIAL/PLATELET
Abs Immature Granulocytes: 0.02 10*3/uL (ref 0.00–0.07)
Basophils Absolute: 0.1 10*3/uL (ref 0.0–0.1)
Basophils Relative: 1 %
Eosinophils Absolute: 0.3 10*3/uL (ref 0.0–0.5)
Eosinophils Relative: 4 %
HCT: 42.3 % (ref 39.0–52.0)
Hemoglobin: 13.2 g/dL (ref 13.0–17.0)
Immature Granulocytes: 0 %
Lymphocytes Relative: 27 %
Lymphs Abs: 2 10*3/uL (ref 0.7–4.0)
MCH: 28.2 pg (ref 26.0–34.0)
MCHC: 31.2 g/dL (ref 30.0–36.0)
MCV: 90.4 fL (ref 80.0–100.0)
Monocytes Absolute: 1 10*3/uL (ref 0.1–1.0)
Monocytes Relative: 14 %
Neutro Abs: 3.9 10*3/uL (ref 1.7–7.7)
Neutrophils Relative %: 54 %
Platelets: 348 10*3/uL (ref 150–400)
RBC: 4.68 MIL/uL (ref 4.22–5.81)
RDW: 18.3 % — ABNORMAL HIGH (ref 11.5–15.5)
WBC: 7.3 10*3/uL (ref 4.0–10.5)
nRBC: 0 % (ref 0.0–0.2)

## 2020-01-05 LAB — PROTIME-INR
INR: 1 (ref 0.8–1.2)
Prothrombin Time: 12.5 seconds (ref 11.4–15.2)

## 2020-01-05 LAB — POC OCCULT BLOOD, ED: Fecal Occult Bld: POSITIVE — AB

## 2020-01-05 MED ORDER — SODIUM CHLORIDE 0.9 % IV BOLUS
500.0000 mL | Freq: Once | INTRAVENOUS | Status: AC
  Administered 2020-01-05: 500 mL via INTRAVENOUS

## 2020-01-05 NOTE — ED Triage Notes (Signed)
Patient here from home reporting GI Bleed that started today. Reports "this happens every 3 months and I have to get blood". Hx of diverticulitis. States bleeding started while at the dinner table. Previous stroke, left sided deficits, walker with cane.

## 2020-01-05 NOTE — ED Provider Notes (Signed)
[facility] DEPT Provider Note: Georgena Spurling, MD, FACEP  CSN: [id number] MRN: [id number] ARRIVAL: 01/05/20 at [place]: [place]  GI Bleeding   HISTORY OF PRESENT ILLNESS  01/05/20 11:03 PM [name] is a 86 y.o. male with a history of GI bleeds.  He is here with rectal bleeding that began after dinner this evening.  He describes the bleeding as mild to moderate.  It is dark in color.  There is no passage of clots.  He denies any lightheadedness, abdominal pain, chest pain or shortness of breath.  Nothing makes the symptoms better or worse.  He states that when he gets these bleeds he often requires blood transfusions but does not believe he has bled enough yet to require one.   Past Medical History:  Diagnosis Date  . Acute CVA (cerebrovascular accident) ([place]) 07/25/2015  . Asthma   . Benign essential HTN   . Cerebral infarction due to embolism of left middle cerebral artery ([place]) 02/03/2014  . Chronic combined systolic and diastolic CHF (congestive heart failure) ([place]) 12/23/2017  . Colitis   . Colon polyps   . Diabetes mellitus type 2, diet-controlled ([place]) 12/22/2013  . Diverticulosis   . GERD (gastroesophageal reflux disease)   . GI bleed   . Hemorrhoids   . Hiatal hernia   . History of esophageal strciture   . HLD (hyperlipidemia)   . Osteoarthritis   . Proctitis   . Status post dilation of esophageal narrowing   . Stroke [facility])     Past Surgical History:  Procedure Laterality Date  . ABDOMINAL HERNIA REPAIR    . BIOPSY  04/05/2019   Procedure: BIOPSY;  Surgeon: Thornton Kachmar, MD;  Location: [place];  Service: Gastroenterology;;  . COLONOSCOPY     multiple  . COLONOSCOPY WITH PROPOFOL N/A 12/12/2016   Procedure: COLONOSCOPY WITH PROPOFOL;  Surgeon: Gatha Mayer, MD;  Location: [facility] ENDOSCOPY;  Service: Endoscopy;  Laterality: N/A;  . COLONOSCOPY WITH PROPOFOL N/A 04/04/2019   Procedure: COLONOSCOPY WITH PROPOFOL;  Surgeon:  Milus Banister, MD;  Location: [facility] ENDOSCOPY;  Service: Endoscopy;  Laterality: N/A;  . ESOPHAGOGASTRODUODENOSCOPY     multiple  . ESOPHAGOGASTRODUODENOSCOPY (EGD) WITH PROPOFOL N/A 04/05/2019   Procedure: ESOPHAGOGASTRODUODENOSCOPY (EGD) WITH PROPOFOL;  Surgeon: Thornton Haggart, MD;  Location: [place];  Service: Gastroenterology;  Laterality: N/A;  . INSERTION OF MESH  01/29/2012   Procedure: INSERTION OF MESH;  Surgeon: Gwenyth Ober, MD;  Location: [place];  Service: General;  Laterality: N/A;  . IR ANGIOGRAM SELECTIVE EACH ADDITIONAL VESSEL  04/01/2019  . IR ANGIOGRAM VISCERAL SELECTIVE  04/01/2019  . IR ANGIOGRAM VISCERAL SELECTIVE  04/01/2019  . IR ANGIOGRAM VISCERAL SELECTIVE  04/01/2019  . IR US GUIDE VASC ACCESS RIGHT  04/01/2019  . NISSEN FUNDOPLICATION    . SPLENECTOMY, TOTAL     nontraumatic rupture  . VENTRAL HERNIA REPAIR  01/29/2012    WITH MESH  . VENTRAL HERNIA REPAIR  01/29/2012   Procedure: HERNIA REPAIR VENTRAL ADULT;  Surgeon: Gwenyth Ober, MD;  Location: [place];  Service: General;  Laterality: N/A;  open recurrent ventral hernia repair with mesh    Family History  Problem Relation Age of Onset  . Alzheimer's disease Mother   . Breast cancer Mother   . Throat cancer Father   . Colon cancer Neg Hx   . Colon polyps Neg Hx   . Pancreatic cancer Neg Hx   . Stomach cancer  Neg Hx   . Liver disease Neg Hx     Social History   Tobacco Use  . Smoking status: Never Smoker  . Smokeless tobacco: Never Used  Substance Use Topics  . Alcohol use: Yes    Alcohol/week: 2.0 standard drinks    Types: 2 Glasses of wine per week    Comment: daily  . Drug use: No    Prior to Admission medications   Medication Sig Start Date End Date Taking? Authorizing Provider  albuterol (PROAIR HFA) 108 (90 Base) MCG/ACT inhaler Inhale 2 puffs into the lungs every 6 (six) hours as needed for wheezing or shortness of breath.    [provider]  albuterol (PROVENTIL) (2.5  MG/3ML) 0.083% nebulizer solution Take 3 mLs by nebulization every 6 (six) hours as needed for wheezing or shortness of breath.  10/04/18   [provider]  budesonide (PULMICORT) 0.5 MG/2ML nebulizer solution Take 2 mLs by nebulization 2 (two) times daily. 09/01/18   [provider]  clopidogrel (PLAVIX) 75 MG tablet Take 1 tablet (75 mg total) by mouth daily. Start only after a week 10/15/19   Shelly Coss, MD  diphenhydramine-acetaminophen (TYLENOL PM) 25-500 MG TABS tablet Take 1 tablet by mouth at bedtime.     [provider]  ferrous sulfate 325 (65 FE) MG tablet Take 1 tablet (325 mg total) by mouth daily. 05/10/19   Milus Banister, MD  fluticasone [facility]) 50 MCG/ACT nasal spray Place 1 spray into both nostrils daily as needed for allergies or rhinitis.  06/18/14   [provider]  Melatonin 10 MG TABS Take 10 mg by mouth at bedtime as needed (for sleep).     [provider]  metFORMIN (GLUCOPHAGE) 500 MG tablet Take 500 mg by mouth daily. 08/23/19   [provider]  montelukast (SINGULAIR) 10 MG tablet Take 10 mg by mouth at bedtime as needed (for breathing flares).     [provider]  polyethylene glycol (MIRALAX / GLYCOLAX) packet Take 17 g by mouth daily as needed for mild constipation ([place]).     [provider]  predniSONE (DELTASONE) 10 MG tablet Take 10 mg by mouth daily. 07/11/19   [provider]  psyllium (METAMUCIL) 58.6 % powder Take 1 packet by mouth daily as needed (for constipation- MIX AND DRINK).     [provider]  rOPINIRole (REQUIP) 3 MG tablet Take 1 tablet (3 mg total) by mouth at bedtime. 10/26/19   Frann Rider, NP  simvastatin (ZOCOR) 40 MG tablet Take 1 tablet (40 mg total) by mouth daily. Patient taking differently: Take 40 mg by mouth at bedtime.  02/26/16   Amin, Jeanella Flattery, MD  traMADol (ULTRAM) 50 MG tablet Take 50 mg by mouth as needed for pain. 09/19/19   [provider]    Allergies Tape and Azithromycin   REVIEW OF SYSTEMS  Negative except as noted here or in the History of Present Illness.   PHYSICAL EXAMINATION  Initial Vital Signs Blood pressure (!) 143/95, pulse 91, SpO2 98 %.  Examination General: Well-developed, well-nourished male in no acute distress; appearance consistent with age of record HENT: normocephalic; atraumatic Eyes: pupils equal, round and reactive to light; extraocular muscles intact Neck: supple Heart: regular rate and rhythm Lungs: clear to auscultation bilaterally Abdomen: soft; nondistended; nontender; no masses or hepatosplenomegaly; bowel sounds present Rectal: Normal sphincter tone; dark maroon stool in rectal vault Extremities: No deformity; full range of motion; pulses  normal; left ankle and supportive brace Neurologic: Awake, alert and oriented; mild left sided weakness most noticeable in the left lower extremity; no facial droop Skin: Warm and dry; no pallor Psychiatric: Normal mood and affect   RESULTS  Summary of this visit's results, reviewed and interpreted by myself:   EKG Interpretation  Date/Time:    Ventricular Rate:    PR Interval:    QRS Duration:   QT Interval:    QTC Calculation:   R Axis:     Text Interpretation:        Laboratory Studies: Results for orders placed or performed during the hospital encounter of 01/05/20 (from the past 24 hour(s))  Comprehensive metabolic panel     Status: Abnormal   Collection Time: 01/05/20 11:08 PM  Result Value Ref Range   Sodium 137 135 - 145 mmol/L   Potassium 4.3 3.5 - 5.1 mmol/L   Chloride 102 98 - 111 mmol/L   CO2 26 22 - 32 mmol/L   Glucose, Bld 135 (H) 70 - 99 mg/dL   BUN 17 8 - 23 mg/dL   Creatinine, Ser 0.79 0.61 - 1.24 mg/dL   Calcium 8.6 (L) 8.9 - 10.3 mg/dL   Total Protein 6.2 (L) 6.5 - 8.1 g/dL   Albumin 3.6 3.5 - 5.0 g/dL   AST 18 15 - 41 U/L   ALT 11 0 - 44 U/L   Alkaline Phosphatase 62 38 - 126 U/L   Total  Bilirubin 0.4 0.3 - 1.2 mg/dL   GFR, Estimated >60 >60 mL/min   Anion gap 9 5 - 15  CBC WITH DIFFERENTIAL     Status: Abnormal   Collection Time: 01/05/20 11:08 PM  Result Value Ref Range   WBC 7.3 4.0 - 10.5 K/uL   RBC 4.68 4.22 - 5.81 MIL/uL   Hemoglobin 13.2 13.0 - 17.0 g/dL   HCT 42.3 39 - 52 %   MCV 90.4 80.0 - 100.0 fL   MCH 28.2 26.0 - 34.0 pg   MCHC 31.2 30.0 - 36.0 g/dL   RDW 18.3 (H) 11.5 - 15.5 %   Platelets 348 150 - 400 K/uL   nRBC 0.0 0.0 - 0.2 %   Neutrophils Relative % 54 %   Neutro Abs 3.9 1.7 - 7.7 K/uL   Lymphocytes Relative 27 %   Lymphs Abs 2.0 0.7 - 4.0 K/uL   Monocytes Relative 14 %   Monocytes Absolute 1.0 0.1 - 1.0 K/uL   Eosinophils Relative 4 %   Eosinophils Absolute 0.3 0.0 - 0.5 K/uL   Basophils Relative 1 %   Basophils Absolute 0.1 0.0 - 0.1 K/uL   Immature Granulocytes 0 %   Abs Immature Granulocytes 0.02 0.00 - 0.07 K/uL  Protime-INR     Status: None   Collection Time: 01/05/20 11:08 PM  Result Value Ref Range   Prothrombin Time 12.5 11.4 - 15.2 seconds   INR 1.0 0.8 - 1.2  Type and screen [place]     Status: None (Preliminary result)   Collection Time: 01/05/20 11:08 PM  Result Value Ref Range   ABO/RH(D) PENDING    Antibody Screen PENDING    Sample Expiration      01/08/2020,2359 Performed at [facility], [place] [address], [place], [place] [address]   POC occult blood, ED     Status: Abnormal   Collection Time: 01/05/20 11:11 PM  Result Value Ref Range   Fecal Occult Bld POSITIVE (A) NEGATIVE  Resp Panel by RT-PCR (Flu A&B, Covid) Nasopharyngeal Swab     Status: None   Collection Time: 01/05/20 11:13 PM   Specimen: Nasopharyngeal Swab; Nasopharyngeal(NP) swabs in vial transport medium  Result Value Ref Range   SARS Coronavirus 2 by RT PCR NEGATIVE NEGATIVE   Influenza A by PCR NEGATIVE NEGATIVE   Influenza B by PCR NEGATIVE NEGATIVE   Imaging Studies: No results found.  ED COURSE and  MDM  Nursing notes, initial and subsequent vitals signs, including pulse oximetry, reviewed and interpreted by myself.  Vitals:   01/05/20 2245 01/05/20 2300 01/05/20 2330 01/06/20 0000  BP: (!) 143/95 (!) 154/95 (!) 143/74 (!) 143/105  Pulse: 91 95 84 96  Resp:  17 19 (!) 23  SpO2: 98% 98% 97% 97%   Medications  sodium chloride 0.9 % bolus 500 mL (500 mLs Intravenous New Bag/Given 01/05/20 2309)   12:44 AM Will have patient admitted for lower GI bleed.  He appears stable at this time but will need observation.  1:14 AM Dr. Hal Hope to admit.   PROCEDURES  Procedures   ED DIAGNOSES     ICD-10-CM   1. Lower GI bleed  K92.2        Aerion Bagdasarian, MD 01/06/20 2419

## 2020-01-06 ENCOUNTER — Encounter (HOSPITAL_COMMUNITY): Payer: Self-pay | Admitting: Internal Medicine

## 2020-01-06 ENCOUNTER — Other Ambulatory Visit: Payer: Self-pay

## 2020-01-06 DIAGNOSIS — Z808 Family history of malignant neoplasm of other organs or systems: Secondary | ICD-10-CM | POA: Diagnosis not present

## 2020-01-06 DIAGNOSIS — K922 Gastrointestinal hemorrhage, unspecified: Secondary | ICD-10-CM | POA: Diagnosis present

## 2020-01-06 DIAGNOSIS — Z66 Do not resuscitate: Secondary | ICD-10-CM | POA: Diagnosis present

## 2020-01-06 DIAGNOSIS — K5731 Diverticulosis of large intestine without perforation or abscess with bleeding: Secondary | ICD-10-CM

## 2020-01-06 DIAGNOSIS — Z20822 Contact with and (suspected) exposure to covid-19: Secondary | ICD-10-CM | POA: Diagnosis present

## 2020-01-06 DIAGNOSIS — I10 Essential (primary) hypertension: Secondary | ICD-10-CM

## 2020-01-06 DIAGNOSIS — Z7984 Long term (current) use of oral hypoglycemic drugs: Secondary | ICD-10-CM | POA: Diagnosis not present

## 2020-01-06 DIAGNOSIS — G4700 Insomnia, unspecified: Secondary | ICD-10-CM | POA: Diagnosis present

## 2020-01-06 DIAGNOSIS — D500 Iron deficiency anemia secondary to blood loss (chronic): Secondary | ICD-10-CM | POA: Diagnosis not present

## 2020-01-06 DIAGNOSIS — Z7951 Long term (current) use of inhaled steroids: Secondary | ICD-10-CM | POA: Diagnosis not present

## 2020-01-06 DIAGNOSIS — Z8673 Personal history of transient ischemic attack (TIA), and cerebral infarction without residual deficits: Secondary | ICD-10-CM | POA: Diagnosis not present

## 2020-01-06 DIAGNOSIS — D62 Acute posthemorrhagic anemia: Secondary | ICD-10-CM

## 2020-01-06 DIAGNOSIS — Z803 Family history of malignant neoplasm of breast: Secondary | ICD-10-CM | POA: Diagnosis not present

## 2020-01-06 DIAGNOSIS — Z820 Family history of epilepsy and other diseases of the nervous system: Secondary | ICD-10-CM | POA: Diagnosis not present

## 2020-01-06 DIAGNOSIS — E785 Hyperlipidemia, unspecified: Secondary | ICD-10-CM | POA: Diagnosis present

## 2020-01-06 DIAGNOSIS — G2581 Restless legs syndrome: Secondary | ICD-10-CM | POA: Diagnosis present

## 2020-01-06 DIAGNOSIS — Z7902 Long term (current) use of antithrombotics/antiplatelets: Secondary | ICD-10-CM | POA: Diagnosis not present

## 2020-01-06 DIAGNOSIS — I5042 Chronic combined systolic (congestive) and diastolic (congestive) heart failure: Secondary | ICD-10-CM | POA: Diagnosis present

## 2020-01-06 DIAGNOSIS — I110 Hypertensive heart disease with heart failure: Secondary | ICD-10-CM | POA: Diagnosis present

## 2020-01-06 DIAGNOSIS — Z9081 Acquired absence of spleen: Secondary | ICD-10-CM | POA: Diagnosis not present

## 2020-01-06 DIAGNOSIS — J45909 Unspecified asthma, uncomplicated: Secondary | ICD-10-CM | POA: Diagnosis present

## 2020-01-06 DIAGNOSIS — K5791 Diverticulosis of intestine, part unspecified, without perforation or abscess with bleeding: Principal | ICD-10-CM

## 2020-01-06 DIAGNOSIS — Z79899 Other long term (current) drug therapy: Secondary | ICD-10-CM | POA: Diagnosis not present

## 2020-01-06 DIAGNOSIS — R7303 Prediabetes: Secondary | ICD-10-CM | POA: Diagnosis present

## 2020-01-06 DIAGNOSIS — Z7952 Long term (current) use of systemic steroids: Secondary | ICD-10-CM | POA: Diagnosis not present

## 2020-01-06 LAB — CBC
HCT: 34.8 % — ABNORMAL LOW (ref 39.0–52.0)
HCT: 33.4 % — ABNORMAL LOW (ref 39.0–52.0)
HCT: 30.7 % — ABNORMAL LOW (ref 39.0–52.0)
Hemoglobin: 10.7 g/dL — ABNORMAL LOW (ref 13.0–17.0)
Hemoglobin: 10.4 g/dL — ABNORMAL LOW (ref 13.0–17.0)
Hemoglobin: 9.4 g/dL — ABNORMAL LOW (ref 13.0–17.0)
MCH: 27.9 pg (ref 26.0–34.0)
MCH: 28.3 pg (ref 26.0–34.0)
MCH: 28.2 pg (ref 26.0–34.0)
MCHC: 30.7 g/dL (ref 30.0–36.0)
MCHC: 31.1 g/dL (ref 30.0–36.0)
MCHC: 30.6 g/dL (ref 30.0–36.0)
MCV: 90.9 fL (ref 80.0–100.0)
MCV: 91 fL (ref 80.0–100.0)
MCV: 92.2 fL (ref 80.0–100.0)
Platelets: 306 10*3/uL (ref 150–400)
Platelets: 305 10*3/uL (ref 150–400)
Platelets: 274 10*3/uL (ref 150–400)
RBC: 3.83 MIL/uL — ABNORMAL LOW (ref 4.22–5.81)
RBC: 3.67 MIL/uL — ABNORMAL LOW (ref 4.22–5.81)
RBC: 3.33 MIL/uL — ABNORMAL LOW (ref 4.22–5.81)
RDW: 18.3 % — ABNORMAL HIGH (ref 11.5–15.5)
RDW: 18.3 % — ABNORMAL HIGH (ref 11.5–15.5)
RDW: 18.6 % — ABNORMAL HIGH (ref 11.5–15.5)
WBC: 9.1 10*3/uL (ref 4.0–10.5)
WBC: 10.8 10*3/uL — ABNORMAL HIGH (ref 4.0–10.5)
WBC: 9.5 10*3/uL (ref 4.0–10.5)
nRBC: 0 % (ref 0.0–0.2)
nRBC: 0 % (ref 0.0–0.2)
nRBC: 0 % (ref 0.0–0.2)

## 2020-01-06 LAB — RESP PANEL BY RT-PCR (FLU A&B, COVID) ARPGX2
Influenza A by PCR: NEGATIVE
Influenza B by PCR: NEGATIVE
SARS Coronavirus 2 by RT PCR: NEGATIVE

## 2020-01-06 LAB — GLUCOSE, CAPILLARY
Glucose-Capillary: 131 mg/dL — ABNORMAL HIGH (ref 70–99)
Glucose-Capillary: 127 mg/dL — ABNORMAL HIGH (ref 70–99)

## 2020-01-06 LAB — HEMOGLOBIN A1C
Hgb A1c MFr Bld: 5.8 % — ABNORMAL HIGH (ref 4.8–5.6)
Mean Plasma Glucose: 119.76 mg/dL

## 2020-01-06 LAB — TYPE AND SCREEN
ABO/RH(D): O POS
Antibody Screen: NEGATIVE

## 2020-01-06 MED ORDER — INSULIN ASPART 100 UNIT/ML ~~LOC~~ SOLN: 0.0000 [IU] | Freq: Every day | SUBCUTANEOUS | Status: DC

## 2020-01-06 MED ORDER — INSULIN ASPART 100 UNIT/ML ~~LOC~~ SOLN
0.0000 [IU] | Freq: Three times a day (TID) | SUBCUTANEOUS | Status: DC
  Administered 2020-01-07 – 2020-01-08 (×3): 2 [IU] via SUBCUTANEOUS

## 2020-01-06 MED ORDER — ACETAMINOPHEN 325 MG PO TABS
650.0000 mg | ORAL_TABLET | Freq: Four times a day (QID) | ORAL | Status: DC | PRN
  Administered 2020-01-07 – 2020-01-08 (×2): 650 mg via ORAL
  Filled 2020-01-06 (×2): qty 2

## 2020-01-06 MED ORDER — SIMVASTATIN 40 MG PO TABS
40.0000 mg | ORAL_TABLET | Freq: Every day | ORAL | Status: DC
  Administered 2020-01-06 – 2020-01-07 (×2): 40 mg via ORAL
  Filled 2020-01-06 (×2): qty 1

## 2020-01-06 MED ORDER — BUDESONIDE 0.5 MG/2ML IN SUSP
2.0000 mL | Freq: Two times a day (BID) | RESPIRATORY_TRACT | Status: DC
  Administered 2020-01-06 – 2020-01-08 (×5): 0.5 mg via RESPIRATORY_TRACT
  Filled 2020-01-06 (×5): qty 2

## 2020-01-06 MED ORDER — MONTELUKAST SODIUM 10 MG PO TABS: 10.0000 mg | ORAL_TABLET | Freq: Every evening | ORAL | Status: DC | PRN

## 2020-01-06 MED ORDER — ACETAMINOPHEN 650 MG RE SUPP: 650.0000 mg | Freq: Four times a day (QID) | RECTAL | Status: DC | PRN

## 2020-01-06 MED ORDER — ROPINIROLE HCL 1 MG PO TABS
3.0000 mg | ORAL_TABLET | Freq: Every day | ORAL | Status: DC
  Administered 2020-01-06 – 2020-01-07 (×2): 3 mg via ORAL
  Filled 2020-01-06 (×2): qty 3

## 2020-01-06 MED ORDER — METHYLPREDNISOLONE SODIUM SUCC 40 MG IJ SOLR
20.0000 mg | Freq: Every day | INTRAMUSCULAR | Status: DC
  Administered 2020-01-06: 20 mg via INTRAVENOUS
  Filled 2020-01-06: qty 1

## 2020-01-06 MED ORDER — DIPHENHYDRAMINE HCL 25 MG PO CAPS
25.0000 mg | ORAL_CAPSULE | Freq: Once | ORAL | Status: AC
  Administered 2020-01-06: 25 mg via ORAL
  Filled 2020-01-06: qty 1

## 2020-01-06 MED ORDER — ALBUTEROL SULFATE (2.5 MG/3ML) 0.083% IN NEBU: 3.0000 mL | INHALATION_SOLUTION | Freq: Four times a day (QID) | RESPIRATORY_TRACT | Status: DC | PRN

## 2020-01-06 MED ORDER — SODIUM CHLORIDE 0.9 % IV SOLN: INTRAVENOUS | Status: DC

## 2020-01-06 MED ORDER — ALBUTEROL SULFATE HFA 108 (90 BASE) MCG/ACT IN AERS
2.0000 | INHALATION_SPRAY | Freq: Four times a day (QID) | RESPIRATORY_TRACT | Status: DC | PRN
  Filled 2020-01-06: qty 6.7

## 2020-01-06 NOTE — H&P (Signed)
History and Physical    [name] [id number]:[id number] DOB: 1931-04-13 DOA: 01/05/2020  PCP: Seward Carol, MD  Patient coming from: [facility] living facility.  Chief Complaint: Rectal bleeding.  HPI: [name] is a 86 y.o. male with history of recurrent GI bleed has had extensive work-up done in February 2021 when patient underwent bleeding scan Meckel scan and EGD colonoscopy likely source was possibly right colon was admitted again later in August 2021.  Presents to the ER with complaints of having at least 2 frank rectal bleeding while on the commode large amount.  No abdominal pain nausea vomiting.  Did not lose consciousness or did not have any chest pain or shortness of breath.  ED Course: In the ER patient was hemodynamically stable hemoglobin is around 13.  Given that patient has had recurrent GI bleed and patient stated it was large-volume bleeding admitted for further observation.  Covid test was negative.  Review of Systems: As per HPI, rest all negative.   Past Medical History:  Diagnosis Date  . Acute CVA (cerebrovascular accident) ([place]) 07/25/2015  . Asthma   . Benign essential HTN   . Cerebral infarction due to embolism of left middle cerebral artery ([place]) 02/03/2014  . Chronic combined systolic and diastolic CHF (congestive heart failure) ([place]) 12/23/2017  . Colitis   . Colon polyps   . Diabetes mellitus type 2, diet-controlled ([place]) 12/22/2013  . Diverticulosis   . GERD (gastroesophageal reflux disease)   . GI bleed   . Hemorrhoids   . Hiatal hernia   . History of esophageal strciture   . HLD (hyperlipidemia)   . Osteoarthritis   . Proctitis   . Status post dilation of esophageal narrowing   . Stroke [facility])     Past Surgical History:  Procedure Laterality Date  . ABDOMINAL HERNIA REPAIR    . BIOPSY  04/05/2019   Procedure: BIOPSY;  Surgeon: Thornton Brodowski, MD;  Location: [place];  Service: Gastroenterology;;  . COLONOSCOPY     multiple  .  COLONOSCOPY WITH PROPOFOL N/A 12/12/2016   Procedure: COLONOSCOPY WITH PROPOFOL;  Surgeon: Gatha Mayer, MD;  Location: [facility] ENDOSCOPY;  Service: Endoscopy;  Laterality: N/A;  . COLONOSCOPY WITH PROPOFOL N/A 04/04/2019   Procedure: COLONOSCOPY WITH PROPOFOL;  Surgeon: Milus Banister, MD;  Location: [facility] ENDOSCOPY;  Service: Endoscopy;  Laterality: N/A;  . ESOPHAGOGASTRODUODENOSCOPY     multiple  . ESOPHAGOGASTRODUODENOSCOPY (EGD) WITH PROPOFOL N/A 04/05/2019   Procedure: ESOPHAGOGASTRODUODENOSCOPY (EGD) WITH PROPOFOL;  Surgeon: Thornton Bossi, MD;  Location: [place];  Service: Gastroenterology;  Laterality: N/A;  . INSERTION OF MESH  01/29/2012   Procedure: INSERTION OF MESH;  Surgeon: Gwenyth Ober, MD;  Location: [place];  Service: General;  Laterality: N/A;  . IR ANGIOGRAM SELECTIVE EACH ADDITIONAL VESSEL  04/01/2019  . IR ANGIOGRAM VISCERAL SELECTIVE  04/01/2019  . IR ANGIOGRAM VISCERAL SELECTIVE  04/01/2019  . IR ANGIOGRAM VISCERAL SELECTIVE  04/01/2019  . IR US GUIDE VASC ACCESS RIGHT  04/01/2019  . NISSEN FUNDOPLICATION    . SPLENECTOMY, TOTAL     nontraumatic rupture  . VENTRAL HERNIA REPAIR  01/29/2012    WITH MESH  . VENTRAL HERNIA REPAIR  01/29/2012   Procedure: HERNIA REPAIR VENTRAL ADULT;  Surgeon: Gwenyth Ober, MD;  Location: [place];  Service: General;  Laterality: N/A;  open recurrent ventral hernia repair with mesh     reports that he has never smoked. He has never used smokeless tobacco. He reports  current alcohol use of about 2.0 standard drinks of alcohol per week. He reports that he does not use drugs.  Allergies  Allergen Reactions  . Tape Other (See Comments)    SKIN IS SENSITIVE; PLEASE USE PAPER TAPE; SKIN BRUISES AND TEARS EASILY!!  . Azithromycin Rash and Other (See Comments)    Pt had a Z-Pak Jan, 2020 - no reaction(??)    Family History  Problem Relation Age of Onset  . Alzheimer's disease Mother   . Breast cancer Mother   . Throat cancer Father   .  Colon cancer Neg Hx   . Colon polyps Neg Hx   . Pancreatic cancer Neg Hx   . Stomach cancer Neg Hx   . Liver disease Neg Hx     Prior to Admission medications   Medication Sig Start Date End Date Taking? Authorizing Provider  albuterol (PROAIR HFA) 108 (90 Base) MCG/ACT inhaler Inhale 2 puffs into the lungs every 6 (six) hours as needed for wheezing or shortness of breath.   Yes [provider]  albuterol (PROVENTIL) (2.5 MG/3ML) 0.083% nebulizer solution Take 3 mLs by nebulization every 6 (six) hours as needed for wheezing or shortness of breath.  10/04/18  Yes [provider]  budesonide (PULMICORT) 0.5 MG/2ML nebulizer solution Take 2 mLs by nebulization 2 (two) times daily. 09/01/18  Yes [provider]  clopidogrel (PLAVIX) 75 MG tablet Take 1 tablet (75 mg total) by mouth daily. Start only after a week 10/15/19  Yes Adhikari, Amrit, MD  diphenhydramine-acetaminophen (TYLENOL PM) 25-500 MG TABS tablet Take 1 tablet by mouth at bedtime.    Yes [provider]  ferrous sulfate 325 (65 FE) MG tablet Take 1 tablet (325 mg total) by mouth daily. 05/10/19  Yes Milus Banister, MD  fluticasone [facility]) 50 MCG/ACT nasal spray Place 1 spray into both nostrils daily as needed for allergies or rhinitis.  06/18/14  Yes [provider]  Melatonin 10 MG TABS Take 10 mg by mouth at bedtime as needed (for sleep).    Yes [provider]  metFORMIN (GLUCOPHAGE) 500 MG tablet Take 500 mg by mouth daily. 08/23/19  Yes [provider]  montelukast (SINGULAIR) 10 MG tablet Take 10 mg by mouth at bedtime as needed (for breathing flares).    Yes [provider]  polyethylene glycol (MIRALAX / GLYCOLAX) packet Take 17 g by mouth daily as needed for mild constipation ([place]).    Yes [provider]  predniSONE (DELTASONE) 10 MG tablet Take 5 mg by mouth daily.  07/11/19  Yes [provider]  psyllium (METAMUCIL) 58.6 % powder  Take 1 packet by mouth daily as needed (for constipation- MIX AND DRINK).    Yes [provider]  rOPINIRole (REQUIP) 3 MG tablet Take 1 tablet (3 mg total) by mouth at bedtime. 10/26/19  Yes McCue, Janett Billow, NP  simvastatin (ZOCOR) 40 MG tablet Take 1 tablet (40 mg total) by mouth daily. Patient taking differently: Take 40 mg by mouth at bedtime.  02/26/16  Yes Amin, Ankit Chirag, MD  traMADol (ULTRAM) 50 MG tablet Take 50 mg by mouth as needed for pain. 09/19/19  Yes [provider]    Physical Exam: Constitutional: Moderately built and nourished. Vitals:   01/06/20 0000 01/06/20 0100 01/06/20 0130 01/06/20 0211  BP: (!) 143/105 (!) 145/106 123/80 (!) 159/81  Pulse: 96 91 89 99  Resp: (!) 23 18 18 18   Temp:    98.3  F (36.8 C)  TempSrc:    Oral  SpO2: 97% 97% 95% 97%   Eyes: Anicteric no pallor. ENMT: No discharge from the ears eyes nose or mouth. Neck: No mass felt.  No neck rigidity. Respiratory: No rhonchi or crepitations. Cardiovascular: S1-S2 heard. Abdomen: Soft nontender bowel sounds present. Musculoskeletal: No edema. Skin: No rash. Neurologic: Alert awake oriented to time place and person.  Moves all extremities. Psychiatric: Appears normal.  Normal affect.   Labs on Admission: I have personally reviewed following labs and imaging studies  CBC: Recent Labs  Lab 01/05/20 2308  WBC 7.3  NEUTROABS 3.9  HGB 13.2  HCT 42.3  MCV 90.4  PLT 149   Basic Metabolic Panel: Recent Labs  Lab 01/05/20 2308  NA 137  K 4.3  CL 102  CO2 26  GLUCOSE 135*  BUN 17  CREATININE 0.79  CALCIUM 8.6*   GFR: CrCl cannot be calculated (Unknown ideal weight.). Liver Function Tests: Recent Labs  Lab 01/05/20 2308  AST 18  ALT 11  ALKPHOS 62  BILITOT 0.4  PROT 6.2*  ALBUMIN 3.6   No results for input(s): LIPASE, AMYLASE in the last 168 hours. No results for input(s): AMMONIA in the last 168 hours. Coagulation Profile: Recent Labs  Lab 01/05/20 2308   INR 1.0   Cardiac Enzymes: No results for input(s): CKTOTAL, CKMB, CKMBINDEX, TROPONINI in the last 168 hours. BNP (last 3 results) No results for input(s): PROBNP in the last 8760 hours. HbA1C: No results for input(s): HGBA1C in the last 72 hours. CBG: No results for input(s): GLUCAP in the last 168 hours. Lipid Profile: No results for input(s): CHOL, HDL, LDLCALC, TRIG, CHOLHDL, LDLDIRECT in the last 72 hours. Thyroid Function Tests: No results for input(s): TSH, T4TOTAL, FREET4, T3FREE, THYROIDAB in the last 72 hours. Anemia Panel: No results for input(s): VITAMINB12, FOLATE, FERRITIN, TIBC, IRON, RETICCTPCT in the last 72 hours. Urine analysis:    Component Value Date/Time   COLORURINE STRAW (A) 04/21/2018 2320   APPEARANCEUR CLEAR 04/21/2018 2320   LABSPEC 1.013 04/21/2018 2320   PHURINE 6.0 04/21/2018 2320   GLUCOSEU NEGATIVE 04/21/2018 2320   HGBUR NEGATIVE 04/21/2018 2320   BILIRUBINUR NEGATIVE 04/21/2018 2320   KETONESUR NEGATIVE 04/21/2018 2320   PROTEINUR NEGATIVE 04/21/2018 2320   UROBILINOGEN 0.2 12/22/2013 1210   NITRITE NEGATIVE 04/21/2018 2320   LEUKOCYTESUR NEGATIVE 04/21/2018 2320   Sepsis Labs: @LABRCNTIP (procalcitonin:4,lacticidven:4) ) Recent Results (from the past 240 hour(s))  Resp Panel by RT-PCR (Flu A&B, Covid) Nasopharyngeal Swab     Status: None   Collection Time: 01/05/20 11:13 PM   Specimen: Nasopharyngeal Swab; Nasopharyngeal(NP) swabs in vial transport medium  Result Value Ref Range Status   SARS Coronavirus 2 by RT PCR NEGATIVE NEGATIVE Final    Comment: (NOTE) SARS-CoV-2 target nucleic acids are NOT DETECTED.  The SARS-CoV-2 RNA is generally detectable in upper respiratory specimens during the acute phase of infection. The lowest concentration of SARS-CoV-2 viral copies this assay can detect is 138 copies/mL. A negative result does not preclude SARS-Cov-2 infection and should not be used as the sole basis for treatment or other  patient management decisions. A negative result may occur with  improper specimen collection/handling, submission of specimen other than nasopharyngeal swab, presence of viral mutation(s) within the areas targeted by this assay, and inadequate number of viral copies(<138 copies/mL). A negative result must be combined with clinical observations, patient history, and epidemiological information. The expected result is Negative.  Fact Sheet for  Patients:  EntrepreneurPulse.com.au  Fact Sheet for Healthcare Providers:  IncredibleEmployment.be  This test is no t yet approved or cleared by the [place] FDA and  has been authorized for detection and/or diagnosis of SARS-CoV-2 by FDA under an Emergency Use Authorization (EUA). This EUA will remain  in effect (meaning this test can be used) for the duration of the COVID-19 declaration under Section 564(b)(1) of the Act, 21 U.S.C.section 360bbb-3(b)(1), unless the authorization is terminated  or revoked sooner.       Influenza A by PCR NEGATIVE NEGATIVE Final   Influenza B by PCR NEGATIVE NEGATIVE Final    Comment: (NOTE) The Xpert Xpress SARS-CoV-2/FLU/RSV plus assay is intended as an aid in the diagnosis of influenza from Nasopharyngeal swab specimens and should not be used as a sole basis for treatment. Nasal washings and aspirates are unacceptable for Xpert Xpress SARS-CoV-2/FLU/RSV testing.  Fact Sheet for Patients: EntrepreneurPulse.com.au  Fact Sheet for Healthcare Providers: IncredibleEmployment.be  This test is not yet approved or cleared by the [place] FDA and has been authorized for detection and/or diagnosis of SARS-CoV-2 by FDA under an Emergency Use Authorization (EUA). This EUA will remain in effect (meaning this test can be used) for the duration of the COVID-19 declaration under Section 564(b)(1) of the Act, 21 U.S.C. section  360bbb-3(b)(1), unless the authorization is terminated or revoked.  Performed at [facility], [place] [address] [place], [place] [address]      Radiological Exams on Admission: No results found.   Assessment/Plan Principal Problem:   Acute GI bleeding Active Problems:   History of CVA (cerebrovascular accident)   Benign essential HTN   DNR (do not resuscitate)    1. Acute GI bleeding -suspect lower GI given that patient had frank rectal bleeding and previous history of possible lower GI bleed.  Has extensive work-up done previously and during admission in February 2021 patient underwent bleeding scan, Meckel scan, EGD colonoscopy.  We will keep patient n.p.o. hold antiplatelet agents check serial CBG consult GI. 2. History of CVA on statins and antiplatelet agents.  Antiplatelet agents will be held at this time. 3. History of prediabetes on Metformin.  Last hemoglobin A1c in March 2020 was 5.9.  Will check serial CBGs. 4. Patient uses prednisone for sinusitis daily for the last 8 months.  Keep patient on stress dose. 5. History of hypertension per the chart but not on any medication. 6. Restless leg syndrome on ropinirole.   DVT prophylaxis: SCDs. Code Status: DNR. Family Communication: Discussed with patient. Disposition Plan: Home. Consults called: [facility]. Admission status: Observation.   Rise Patience MD [facility] Pager [phone].  If 7PM-7AM, please contact night-coverage [email or web address] Password [email or web address]  01/06/2020, 2:15 AM

## 2020-01-06 NOTE — Plan of Care (Signed)

## 2020-01-06 NOTE — Progress Notes (Signed)
PROGRESS NOTE    [name]  VXY:[id number] DOB: 1931-08-23 DOA: 01/05/2020 PCP: Seward Carol, MD    Brief Narrative:  86 y.o. male with history of recurrent GI bleed has had extensive work-up done in February 2021 when patient underwent bleeding scan Meckel scan and EGD colonoscopy likely source was possibly right colon was admitted again later in August 2021.  Presents to the ER with complaints of having at least 2 frank rectal bleeding while on the commode large amount.  No abdominal pain nausea vomiting.  Did not lose consciousness or did not have any chest pain or shortness of breath.  ED Course: In the ER patient was hemodynamically stable hemoglobin is around 13.  Given that patient has had recurrent GI bleed and patient stated it was large-volume bleeding admitted for further observation.  Covid test was negative  Assessment & Plan:   Principal Problem:   Acute GI bleeding Active Problems:   History of CVA (cerebrovascular accident)   Benign essential HTN   GI bleed   DNR (do not resuscitate)  1. Acute GI bleeding 1. Pt presents with suspected lower GI source 2. Chart reviewed. Pt had previous extensive GI work up in 2/21 for similar occurrence  3. GI consulted, appreciate input. Plan to cont to monitor for now and if pt has another episode of significant blood loss, then would obtain CT abd 4. Cont on clear liquids for now 5. Will repeat CBC in AM 2. History of CVA on statins and antiplatelet agents.   1. Seems stable at this time 2. Antiplatelet agents will be held at this time. 3. History of prediabetes on Metformin.   1. Last hemoglobin A1c in March 2020 was 5.9.   2. Cont SSI coverage as needed 4. Patient uses prednisone for sinusitis daily for the last 8 months.   1. Continue on steroids. 5. History of hypertension 1. BP stable and controlled 2. Not on BP meds 6. Restless leg syndrome 1. Continue on ropinirole.  DVT prophylaxis: SCD's Code Status:  DNR Family Communication: Pt in room, family not at bedside  Status is: Inpatient  Remains inpatient appropriate because:Ongoing diagnostic testing needed not appropriate for outpatient work up and Inpatient level of care appropriate due to severity of illness   Dispo: The patient is from: Independent living              Anticipated d/c is to: Independent living              Anticipated d/c date is: 2 days              Patient currently is not medically stable to d/c.       Consultants:   GI  Procedures:     Antimicrobials: Anti-infectives (From admission, onward)   None       Subjective: Without complaints this AM  Objective: Vitals:   01/06/20 0835 01/06/20 1006 01/06/20 1353 01/06/20 1357  BP:  129/73 124/75 133/86  Pulse:  (!) 105 99 69  Resp:  18 16 14   Temp:  98.1 F (36.7 C) 97.9 F (36.6 C) 97.6 F (36.4 C)  TempSrc:  Oral Oral Oral  SpO2: 95% 97% 98% 97%  Weight:      Height:        Intake/Output Summary (Last 24 hours) at 01/06/2020 1659 Last data filed at 01/06/2020 1100 Gross per 24 hour  Intake 228.38 ml  Output --  Net 228.38 ml   [employer]  01/06/20 0211  Weight: 70.8 kg    Examination:  General exam: Appears calm and comfortable  Respiratory system: Clear to auscultation. Respiratory effort normal. Cardiovascular system: S1 & S2 heard, Regular Gastrointestinal system: Abdomen is nondistended, soft and nontender. No organomegaly or masses felt. Normal bowel sounds heard. Central nervous system: Alert and oriented. No focal neurological deficits. Extremities: Symmetric 5 x 5 power. Skin: No rashes, lesions Psychiatry: Judgement and insight appear normal. Mood & affect appropriate.   Data Reviewed: I have personally reviewed following labs and imaging studies  CBC: Recent Labs  Lab 01/05/20 2308 01/06/20 0612 01/06/20 1105 01/06/20 1538  WBC 7.3 9.1 10.8* 9.5  NEUTROABS 3.9  --   --   --   HGB 13.2 10.7* 10.4* 9.4*   HCT 42.3 34.8* 33.4* 30.7*  MCV 90.4 90.9 91.0 92.2  PLT 348 306 305 638   Basic Metabolic Panel: Recent Labs  Lab 01/05/20 2308  NA 137  K 4.3  CL 102  CO2 26  GLUCOSE 135*  BUN 17  CREATININE 0.79  CALCIUM 8.6*   GFR: Estimated Creatinine Clearance: 66.3 mL/min (by C-G formula based on SCr of 0.79 mg/dL). Liver Function Tests: Recent Labs  Lab 01/05/20 2308  AST 18  ALT 11  ALKPHOS 62  BILITOT 0.4  PROT 6.2*  ALBUMIN 3.6   No results for input(s): LIPASE, AMYLASE in the last 168 hours. No results for input(s): AMMONIA in the last 168 hours. Coagulation Profile: Recent Labs  Lab 01/05/20 2308  INR 1.0   Cardiac Enzymes: No results for input(s): CKTOTAL, CKMB, CKMBINDEX, TROPONINI in the last 168 hours. BNP (last 3 results) No results for input(s): PROBNP in the last 8760 hours. HbA1C: No results for input(s): HGBA1C in the last 72 hours. CBG: No results for input(s): GLUCAP in the last 168 hours. Lipid Profile: No results for input(s): CHOL, HDL, LDLCALC, TRIG, CHOLHDL, LDLDIRECT in the last 72 hours. Thyroid Function Tests: No results for input(s): TSH, T4TOTAL, FREET4, T3FREE, THYROIDAB in the last 72 hours. Anemia Panel: No results for input(s): VITAMINB12, FOLATE, FERRITIN, TIBC, IRON, RETICCTPCT in the last 72 hours. Sepsis Labs: No results for input(s): PROCALCITON, LATICACIDVEN in the last 168 hours.  Recent Results (from the past 240 hour(s))  Resp Panel by RT-PCR (Flu A&B, Covid) Nasopharyngeal Swab     Status: None   Collection Time: 01/05/20 11:13 PM   Specimen: Nasopharyngeal Swab; Nasopharyngeal(NP) swabs in vial transport medium  Result Value Ref Range Status   SARS Coronavirus 2 by RT PCR NEGATIVE NEGATIVE Final    Comment: (NOTE) SARS-CoV-2 target nucleic acids are NOT DETECTED.  The SARS-CoV-2 RNA is generally detectable in upper respiratory specimens during the acute phase of infection. The lowest concentration of SARS-CoV-2 viral  copies this assay can detect is 138 copies/mL. A negative result does not preclude SARS-Cov-2 infection and should not be used as the sole basis for treatment or other patient management decisions. A negative result may occur with  improper specimen collection/handling, submission of specimen other than nasopharyngeal swab, presence of viral mutation(s) within the areas targeted by this assay, and inadequate number of viral copies(<138 copies/mL). A negative result must be combined with clinical observations, patient history, and epidemiological information. The expected result is Negative.  Fact Sheet for Patients:  EntrepreneurPulse.com.au  Fact Sheet for Healthcare Providers:  IncredibleEmployment.be  This test is no t yet approved or cleared by the [place] FDA and  has been authorized for detection and/or diagnosis  of SARS-CoV-2 by FDA under an Emergency Use Authorization (EUA). This EUA will remain  in effect (meaning this test can be used) for the duration of the COVID-19 declaration under Section 564(b)(1) of the Act, 21 U.S.C.section 360bbb-3(b)(1), unless the authorization is terminated  or revoked sooner.       Influenza A by PCR NEGATIVE NEGATIVE Final   Influenza B by PCR NEGATIVE NEGATIVE Final    Comment: (NOTE) The Xpert Xpress SARS-CoV-2/FLU/RSV plus assay is intended as an aid in the diagnosis of influenza from Nasopharyngeal swab specimens and should not be used as a sole basis for treatment. Nasal washings and aspirates are unacceptable for Xpert Xpress SARS-CoV-2/FLU/RSV testing.  Fact Sheet for Patients: EntrepreneurPulse.com.au  Fact Sheet for Healthcare Providers: IncredibleEmployment.be  This test is not yet approved or cleared by the [place] FDA and has been authorized for detection and/or diagnosis of SARS-CoV-2 by FDA under an Emergency Use Authorization (EUA). This  EUA will remain in effect (meaning this test can be used) for the duration of the COVID-19 declaration under Section 564(b)(1) of the Act, 21 U.S.C. section 360bbb-3(b)(1), unless the authorization is terminated or revoked.  Performed at [facility], [place] [address], [place], [place] [address]      Radiology Studies: No results found.  Scheduled Meds: . budesonide  2 mL Nebulization BID  . methylPREDNISolone (SOLU-MEDROL) injection  20 mg Intravenous Daily  . rOPINIRole  3 mg Oral QHS  . simvastatin  40 mg Oral QHS   Continuous Infusions: . sodium chloride 75 mL/hr at 01/06/20 1552     LOS: 0 days   Marylu Lund, MD [facility] Pager On Amion  If 7PM-7AM, please contact night-coverage 01/06/2020, 4:59 PM

## 2020-01-06 NOTE — Progress Notes (Signed)
Patient up to [facility] with assistance, passed moderate amt. Of dark red blood from rectum.

## 2020-01-06 NOTE — Consult Note (Addendum)
Referring Provider:  Dr. Wyline Copas Primary Care Physician:  Seward Carol, MD Primary Gastroenterologist:  Dr. Ardis Hughs  Reason for Consultation:  GI Bleed  HPI: [name] is a 86 y.o. male with past medical history of recurrent GI bleeding and extensive evaluation in regards to this as late as February 2021.  He also has past medical history of CVA and is maintained on Plavix, which has likely complicated his course, but is high risk for recurrent stroke.  Colonoscopy February 2021 showed diverticulosis with dark stool throughout.  EGD February 2021 showed hiatal hernia, some gastritis, essentially no obvious source of his GI bleeding.  Hemoglobin currently 10.4 g.  On admission yesterday was 13.2 g.  He reports that this began suddenly yesterday evening.  Had the urge to move his bowels and passed a large amount of blood twice PTA.  Had two medium sized BMs with dark red blood this AM.  Denies abdominal pain.  Mouth is dry and wants something to drink.  Was admitted again in August for GI bleed and CTA was negative for source at that time.  Past Medical History:  Diagnosis Date  . Acute CVA (cerebrovascular accident) ([place]) 07/25/2015  . Asthma   . Benign essential HTN   . Cerebral infarction due to embolism of left middle cerebral artery ([place]) 02/03/2014  . Chronic combined systolic and diastolic CHF (congestive heart failure) ([place]) 12/23/2017  . Colitis   . Colon polyps   . Diabetes mellitus type 2, diet-controlled ([place]) 12/22/2013  . Diverticulosis   . GERD (gastroesophageal reflux disease)   . GI bleed   . Hemorrhoids   . Hiatal hernia   . History of esophageal strciture   . HLD (hyperlipidemia)   . Osteoarthritis   . Proctitis   . Status post dilation of esophageal narrowing   . Stroke [facility])     Past Surgical History:  Procedure Laterality Date  . ABDOMINAL HERNIA REPAIR    . BIOPSY  04/05/2019   Procedure: BIOPSY;  Surgeon: Thornton Paden, MD;  Location: [place];   Service: Gastroenterology;;  . COLONOSCOPY     multiple  . COLONOSCOPY WITH PROPOFOL N/A 12/12/2016   Procedure: COLONOSCOPY WITH PROPOFOL;  Surgeon: Gatha Mayer, MD;  Location: [facility] ENDOSCOPY;  Service: Endoscopy;  Laterality: N/A;  . COLONOSCOPY WITH PROPOFOL N/A 04/04/2019   Procedure: COLONOSCOPY WITH PROPOFOL;  Surgeon: Milus Banister, MD;  Location[facility] ENDOSCOPY;  Service: Endoscopy;  Laterality: N/A;  . ESOPHAGOGASTRODUODENOSCOPY     multiple  . ESOPHAGOGASTRODUODENOSCOPY (EGD) WITH PROPOFOL N/A 04/05/2019   Procedure: ESOPHAGOGASTRODUODENOSCOPY (EGD) WITH PROPOFOL;  Surgeon: Thornton Kamara, MD;  Location: [place];  Service: Gastroenterology;  Laterality: N/A;  . INSERTION OF MESH  01/29/2012   Procedure: INSERTION OF MESH;  Surgeon: Gwenyth Ober, MD;  Location: [place];  Service: General;  Laterality: N/A;  . IR ANGIOGRAM SELECTIVE EACH ADDITIONAL VESSEL  04/01/2019  . IR ANGIOGRAM VISCERAL SELECTIVE  04/01/2019  . IR ANGIOGRAM VISCERAL SELECTIVE  04/01/2019  . IR ANGIOGRAM VISCERAL SELECTIVE  04/01/2019  . IR US GUIDE VASC ACCESS RIGHT  04/01/2019  . NISSEN FUNDOPLICATION    . SPLENECTOMY, TOTAL     nontraumatic rupture  . VENTRAL HERNIA REPAIR  01/29/2012    WITH MESH  . VENTRAL HERNIA REPAIR  01/29/2012   Procedure: HERNIA REPAIR VENTRAL ADULT;  Surgeon: Gwenyth Ober, MD;  Location: [place];  Service: General;  Laterality: N/A;  open recurrent ventral hernia repair with  mesh    Prior to Admission medications   Medication Sig Start Date End Date Taking? Authorizing Provider  albuterol (PROAIR HFA) 108 (90 Base) MCG/ACT inhaler Inhale 2 puffs into the lungs every 6 (six) hours as needed for wheezing or shortness of breath.   Yes [provider]  albuterol (PROVENTIL) (2.5 MG/3ML) 0.083% nebulizer solution Take 3 mLs by nebulization every 6 (six) hours as needed for wheezing or shortness of breath.  10/04/18  Yes [provider]  budesonide (PULMICORT) 0.5  MG/2ML nebulizer solution Take 2 mLs by nebulization 2 (two) times daily. 09/01/18  Yes [provider]  clopidogrel (PLAVIX) 75 MG tablet Take 1 tablet (75 mg total) by mouth daily. Start only after a week 10/15/19  Yes Adhikari, Amrit, MD  diphenhydramine-acetaminophen (TYLENOL PM) 25-500 MG TABS tablet Take 1 tablet by mouth at bedtime.    Yes [provider]  ferrous sulfate 325 (65 FE) MG tablet Take 1 tablet (325 mg total) by mouth daily. 05/10/19  Yes Milus Banister, MD  fluticasone [facility]) 50 MCG/ACT nasal spray Place 1 spray into both nostrils daily as needed for allergies or rhinitis.  06/18/14  Yes [provider]  Melatonin 10 MG TABS Take 10 mg by mouth at bedtime as needed (for sleep).    Yes [provider]  metFORMIN (GLUCOPHAGE) 500 MG tablet Take 500 mg by mouth daily. 08/23/19  Yes [provider]  montelukast (SINGULAIR) 10 MG tablet Take 10 mg by mouth at bedtime as needed (for breathing flares).    Yes [provider]  polyethylene glycol (MIRALAX / GLYCOLAX) packet Take 17 g by mouth daily as needed for mild constipation ([place]).    Yes [provider]  predniSONE (DELTASONE) 10 MG tablet Take 5 mg by mouth daily.  07/11/19  Yes [provider]  psyllium (METAMUCIL) 58.6 % powder Take 1 packet by mouth daily as needed (for constipation- MIX AND DRINK).    Yes [provider]  rOPINIRole (REQUIP) 3 MG tablet Take 1 tablet (3 mg total) by mouth at bedtime. 10/26/19  Yes McCue, Janett Billow, NP  simvastatin (ZOCOR) 40 MG tablet Take 1 tablet (40 mg total) by mouth daily. Patient taking differently: Take 40 mg by mouth at bedtime.  02/26/16  Yes Amin, Ankit Chirag, MD  traMADol (ULTRAM) 50 MG tablet Take 50 mg by mouth as needed for pain. 09/19/19  Yes [provider]    Current Facility-Administered Medications  Medication Dose Route Frequency Provider Last Rate Last Admin  . 0.9 %  sodium  chloride infusion   Intravenous Continuous Rise Patience, MD 75 mL/hr at 01/06/20 0257 New Bag at 01/06/20 0257  . acetaminophen (TYLENOL) tablet 650 mg  650 mg Oral Q6H PRN Rise Patience, MD       Or  . acetaminophen (TYLENOL) suppository 650 mg  650 mg Rectal Q6H PRN Rise Patience, MD      . albuterol (VENTOLIN HFA) 108 (90 Base) MCG/ACT inhaler 2 puff  2 puff Inhalation Q6H PRN Donne Hazel, MD      . budesonide (PULMICORT) nebulizer solution 0.5 mg  2 mL Nebulization BID Rise Patience, MD   0.5 mg at 01/06/20 0835  . methylPREDNISolone sodium succinate (SOLU-MEDROL) 40 mg/mL injection 20 mg  20 mg Intravenous Daily Rise Patience, MD      . montelukast (SINGULAIR) tablet 10 mg  10 mg Oral QHS PRN Donne Hazel, MD      .  rOPINIRole (REQUIP) tablet 3 mg  3 mg Oral QHS Rise Patience, MD      . simvastatin (ZOCOR) tablet 40 mg  40 mg Oral QHS Rise Patience, MD        Allergies as of 01/05/2020 - Review Complete 01/05/2020  Allergen Reaction Noted  . Tape Other (See Comments) 02/04/2018  . Azithromycin Rash and Other (See Comments) 03/08/2015    Family History  Problem Relation Age of Onset  . Alzheimer's disease Mother   . Breast cancer Mother   . Throat cancer Father   . Colon cancer Neg Hx   . Colon polyps Neg Hx   . Pancreatic cancer Neg Hx   . Stomach cancer Neg Hx   . Liver disease Neg Hx     Social History   Socioeconomic History  . Marital status: Married    Spouse name: [name]  . Number of children: 2  . Years of education: Bachelor's  . Highest education level: Not on file  Occupational History  . Occupation: retired  Tobacco Use  . Smoking status: Never Smoker  . Smokeless tobacco: Never Used  Substance and Sexual Activity  . Alcohol use: Yes    Alcohol/week: 2.0 standard drinks    Types: 2 Glasses of wine per week    Comment: daily  . Drug use: No  . Sexual activity: Not on file  Other Topics Concern   . Not on file  Social History Narrative   Patient is married and has 2 children.   Patient is right handed.   Patient has Bachelor's degree.   Patient drinks 2 cups daily.   Social Determinants of Health   Financial Resource Strain:   . Difficulty of Paying Living Expenses: Not on file  Food Insecurity:   . Worried About Charity fundraiser in the Last Year: Not on file  . Ran Out of Food in the Last Year: Not on file  Transportation Needs:   . Lack of Transportation (Medical): Not on file  . Lack of Transportation (Non-Medical): Not on file  Physical Activity:   . Days of Exercise per Week: Not on file  . Minutes of Exercise per Session: Not on file  Stress:   . Feeling of Stress : Not on file  Social Connections:   . Frequency of Communication with Friends and Family: Not on file  . Frequency of Social Gatherings with Friends and Family: Not on file  . Attends Religious Services: Not on file  . Active Member of Clubs or Organizations: Not on file  . Attends Archivist Meetings: Not on file  . Marital Status: Not on file  Intimate Partner Violence:   . Fear of Current or Ex-Partner: Not on file  . Emotionally Abused: Not on file  . Physically Abused: Not on file  . Sexually Abused: Not on file    Review of Systems: ROS is O/W negative except as mentioned in HPI.  Physical Exam: Vital signs in last 24 hours: Temp:  [98.1 F (36.7 C)-98.3 F (36.8 C)] 98.1 F (36.7 C) (11/20 1006) Pulse Rate:  [84-106] 105 (11/20 1006) Resp:  [17-23] 18 (11/20 1006) BP: (122-159)/(73-106) 129/73 (11/20 1006) SpO2:  [93 %-98 %] 97 % (11/20 1006) Weight:  [70.8 kg] 70.8 kg (11/20 0211) Last BM Date: 01/06/20 General:  Alert, Well-developed, well-nourished, pleasant and cooperative in NAD Head:  Normocephalic and atraumatic. Eyes:  Sclera clear, no icterus.  Conjunctiva pink. Ears:  Normal auditory  acuity. Mouth:  No deformity or lesions.   Lungs:  Clear throughout to  auscultation.  No wheezes, crackles, or rhonchi.  Heart:  Slightly tachy; no murmurs, clicks, rubs, or gallops. Abdomen:  Soft, non-distended.  BS present.  Non-tender.   Msk:  Symmetrical without gross deformities. Pulses:  Normal pulses noted. Extremities:  Without clubbing or edema. Neurologic:  Alert and oriented x 4;  grossly normal neurologically. Skin:  Intact without significant lesions or rashes. Psych:  Alert and cooperative. Normal mood and affect.  Intake/Output from previous day: 11/19 0701 - 11/20 0700 In: 228.4 [I.V.:228.4] Out: -   Lab Results: Recent Labs    01/05/20 2308 01/06/20 0612 01/06/20 1105  WBC 7.3 9.1 10.8*  HGB 13.2 10.7* 10.4*  HCT 42.3 34.8* 33.4*  PLT 348 306 305   BMET Recent Labs    01/05/20 2308  NA 137  K 4.3  CL 102  CO2 26  GLUCOSE 135*  BUN 17  CREATININE 0.79  CALCIUM 8.6*   LFT Recent Labs    01/05/20 2308  PROT 6.2*  ALBUMIN 3.6  AST 18  ALT 11  ALKPHOS 62  BILITOT 0.4   PT/INR Recent Labs    01/05/20 2308  LABPROT 12.5  INR 1.0   IMPRESSION:  1. Acute GI bleeding -suspect lower GI bleed given that patient had frank rectal bleeding and previous history lower GI bleed.  Had extensive work-up done previously and during admission in February 2021 patient underwent bleeding scan, Meckel scan, EGD, and colonoscopy. 2. History of CVA on Plavix, which is being held at this time. 3. Acute blood loss anemia: Hemoglobin 10.4 g down from 13.2 g yesterday.  PLAN: -Monitor Hgb and transfuse if needed. -Ok for clear liquids for now. -If continues to bleed briskly then consider repeat CTA (had negative CTA in August during admission for GI bleed).   Laban Emperor. Zehr  01/06/2020, 12:40 PM  GI ATTENDING  History, laboratories, x-rays, prior extensive GI work-up reviewed.  Patient personally seen and examined.  Agree with comprehensive consultation note as outlined above.  The patient presents with painless hematochezia  consistent with diverticular bleed.  He is on Plavix therapy.  Similar problem earlier this year for which she underwent extensive work-up.  He is comfortable this evening.  No bowel movement since this morning.  Continue with observation, monitoring, and supportive care as indicated.  CTA for the development of brisk bleeding.  We will follow.  Docia Chuck. Geri Seminole., M.D. [facility] Division of Gastroenterology

## 2020-01-07 DIAGNOSIS — K5790 Diverticulosis of intestine, part unspecified, without perforation or abscess without bleeding: Secondary | ICD-10-CM

## 2020-01-07 DIAGNOSIS — K5791 Diverticulosis of intestine, part unspecified, without perforation or abscess with bleeding: Secondary | ICD-10-CM | POA: Diagnosis not present

## 2020-01-07 DIAGNOSIS — Z66 Do not resuscitate: Secondary | ICD-10-CM | POA: Diagnosis not present

## 2020-01-07 LAB — GLUCOSE, CAPILLARY
Glucose-Capillary: 140 mg/dL — ABNORMAL HIGH (ref 70–99)
Glucose-Capillary: 128 mg/dL — ABNORMAL HIGH (ref 70–99)
Glucose-Capillary: 93 mg/dL (ref 70–99)
Glucose-Capillary: 161 mg/dL — ABNORMAL HIGH (ref 70–99)

## 2020-01-07 LAB — CBC
HCT: 30.5 % — ABNORMAL LOW (ref 39.0–52.0)
HCT: 28.1 % — ABNORMAL LOW (ref 39.0–52.0)
Hemoglobin: 9.5 g/dL — ABNORMAL LOW (ref 13.0–17.0)
Hemoglobin: 8.6 g/dL — ABNORMAL LOW (ref 13.0–17.0)
MCH: 28.3 pg (ref 26.0–34.0)
MCH: 28.5 pg (ref 26.0–34.0)
MCHC: 31.1 g/dL (ref 30.0–36.0)
MCHC: 30.6 g/dL (ref 30.0–36.0)
MCV: 90.8 fL (ref 80.0–100.0)
MCV: 93 fL (ref 80.0–100.0)
Platelets: 287 10*3/uL (ref 150–400)
Platelets: 289 10*3/uL (ref 150–400)
RBC: 3.36 MIL/uL — ABNORMAL LOW (ref 4.22–5.81)
RBC: 3.02 MIL/uL — ABNORMAL LOW (ref 4.22–5.81)
RDW: 18.7 % — ABNORMAL HIGH (ref 11.5–15.5)
RDW: 18.6 % — ABNORMAL HIGH (ref 11.5–15.5)
WBC: 10.2 10*3/uL (ref 4.0–10.5)
WBC: 10.4 10*3/uL (ref 4.0–10.5)
nRBC: 0 % (ref 0.0–0.2)
nRBC: 0 % (ref 0.0–0.2)

## 2020-01-07 MED ORDER — PREDNISONE 5 MG PO TABS
5.0000 mg | ORAL_TABLET | Freq: Every day | ORAL | Status: DC
  Administered 2020-01-08: 5 mg via ORAL
  Filled 2020-01-07: qty 1

## 2020-01-07 MED ORDER — ZOLPIDEM TARTRATE 5 MG PO TABS
5.0000 mg | ORAL_TABLET | Freq: Every day | ORAL | Status: DC
  Administered 2020-01-07: 5 mg via ORAL
  Filled 2020-01-07: qty 1

## 2020-01-07 MED ORDER — CLOPIDOGREL BISULFATE 75 MG PO TABS
75.0000 mg | ORAL_TABLET | Freq: Every day | ORAL | Status: DC
Start: 2020-01-14 — End: 2020-01-08

## 2020-01-07 NOTE — Progress Notes (Addendum)
     [facility] Progress Note  CC:  GI bleed  Subjective:  Feels good.  No further sign of bleeding since before I saw him yesterday.  He would like to go home later today so that he can sleep in his own bed tonight.  Objective:  Vital signs in last 24 hours: Temp:  [97.6 F (36.4 C)-98 F (36.7 C)] 97.7 F (36.5 C) (11/21 0533) Pulse Rate:  [69-101] 101 (11/21 0533) Resp:  [14-20] 18 (11/21 0533) BP: (124-133)/(60-86) 129/79 (11/21 0533) SpO2:  [96 %-98 %] 96 % (11/21 0803) Last BM Date: 01/06/20 General:  Alert, Well-developed, in NAD Heart:  Regular rate and rhythm; no murmurs Pulm:  CTAB.  No W/R/R. Abdomen:  Soft, non-distended.  BS present.  Non-tender. Extremities:  Without edema. Neurologic:  Alert and oriented x 4;  grossly normal neurologically. Psych:  Alert and cooperative. Normal mood and affect.  Intake/Output from previous day: 11/20 0701 - 11/21 0700 In: 0  Out: 950 [Urine:950]  Lab Results: Recent Labs    01/06/20 1105 01/06/20 1538 01/07/20 0848  WBC 10.8* 9.5 10.2  HGB 10.4* 9.4* 9.5*  HCT 33.4* 30.7* 30.5*  PLT 305 274 287   BMET Recent Labs    01/05/20 2308  NA 137  K 4.3  CL 102  CO2 26  GLUCOSE 135*  BUN 17  CREATININE 0.79  CALCIUM 8.6*   LFT Recent Labs    01/05/20 2308  PROT 6.2*  ALBUMIN 3.6  AST 18  ALT 11  ALKPHOS 62  BILITOT 0.4   PT/INR Recent Labs    01/05/20 2308  LABPROT 12.5  INR 1.0   Assessment / Plan: 1. Acute GI bleeding-suspect diverticular bleed as he has previous history of the same. Had extensive work-up done previously and during admission in February 2021 patient underwent bleeding scan, Meckel scan, EGD, and colonoscopy.  No further sign of bleeding since yesterday, 11/20. 2. History of CVA on Plavix, which is being held at this time. 3. Acute blood loss anemia: Hemoglobin stable at 9.5 grams this AM.  -Monitor Hgb and transfuse if needed. -Will advance to soft diet. -? How  long to hold Plavix before resuming?   LOS: 1 day   [name]  01/07/2020, 10:48 AM  GI ATTENDING  History, laboratories, other interval data reviewed.  Patient personally seen and examined.  Wife in bedroom.  Agree with comprehensive interval progress note as outlined above.  The patient is sitting comfortably in his chair.  He has not had a bowel movement in greater than 1 day.  He is eating regular food for lunch.  He wants to go home today.  His last hemoglobin was 9.5.  His Plavix is being held (second day off today).  He has follow-up hemoglobin this afternoon.  I believe that the patient can go home later today if he is continuing to do well without any evidence of bleeding.  In terms of his Plavix, I have asked him to hold this for an additional 2 days and then resume, assuming that he has had no further bleeding.  Please call if there are any questions or if interval problems develop.  I have discussed this with the patient and his wife.  We will sign off.  Thank you  Docia Chuck. Geri Seminole., M.D. [facility] Division of Gastroenterology

## 2020-01-07 NOTE — Progress Notes (Signed)
CBC resulted, MD made aware, Discharge cancelled for today, pt aware, no dark stool today. Will cont to monitor. SRP, RN

## 2020-01-07 NOTE — Plan of Care (Signed)

## 2020-01-07 NOTE — Plan of Care (Signed)
  Problem: Education: Goal: Knowledge of General Education information will improve Description: Including pain rating scale, medication(s)/side effects and non-pharmacologic comfort measures 01/07/2020 1315 by Zadie Rhine, RN Outcome: Progressing 01/07/2020 1310 by Zadie Rhine, RN Outcome: Progressing   Problem: Health Behavior/Discharge Planning: Goal: Ability to manage health-related needs will improve 01/07/2020 1315 by Zadie Rhine, RN Outcome: Progressing 01/07/2020 1310 by Zadie Rhine, RN Outcome: Progressing   Problem: Clinical Measurements: Goal: Ability to maintain clinical measurements within normal limits will improve Outcome: Progressing

## 2020-01-07 NOTE — Progress Notes (Signed)
PROGRESS NOTE  GAL FELDHAUS BBC:[id number] DOB: 05-31-1931   PCP: Seward Carol, MD  Patient is from: Home  DOA: 01/05/2020 LOS: 1  Chief complaints: Rectal bleed  Brief Narrative / Interim history: 86 year old male with history of recurrent diverticular bleed with previous extensive work-up including nuclear scan, EGD and colonoscopy presenting with hematochezia.  Patient is on Plavix.  In ED, hemodynamically stable.  Hgb 13 (9.8 about 3 months ago).  Patient was admitted given recurrence and large volume bleeding.  GI consulted.  The next day, hemoglobin dropped to 10.7 and slowly trended down to 8.6.  Subjective: Seen and examined earlier this morning.  No major events overnight or this morning.  He has not a bowel movement since yesterday.  He is eager to go home mainly due to difficulty sleeping in hospital bed.  He denies chest pain, dyspnea, lightheadedness, GI or UTI symptoms.  Objective: Vitals:   01/06/20 2023 01/07/20 0533 01/07/20 0803 01/07/20 1431  BP: 128/60 129/79  114/66  Pulse: 88 (!) 101  84  Resp:  18  16  Temp: 98 F (36.7 C) 97.7 F (36.5 C)  (!) 97.3 F (36.3 C)  TempSrc: Oral   Oral  SpO2: 98% 96% 96% 100%  Weight:      Height:        Intake/Output Summary (Last 24 hours) at 01/07/2020 1657 Last data filed at 01/07/2020 1500 Gross per 24 hour  Intake --  Output 1400 ml  Net -1400 ml   Filed Weights   01/06/20 0211  Weight: 70.8 kg    Examination:  GENERAL: No apparent distress.  Nontoxic. HEENT: MMM.  Vision and hearing grossly intact.  NECK: Supple.  No apparent JVD.  RESP:  No IWOB.  Fair aeration bilaterally. CVS:  RRR. Heart sounds normal.  ABD/GI/GU: BS+. Abd soft, NTND.  MSK/EXT:  Moves extremities. No apparent deformity. No edema.  SKIN: no apparent skin lesion or wound NEURO: Awake, alert and oriented appropriately.  No apparent focal neuro deficit. PSYCH: Calm. Normal affect.   Procedures:  None  Microbiology  summarized: [id number], influenza and RSV PCR nonreactive.  Assessment & Plan: Acute blood loss anemia due to diverticular bleed: Hgb 9.8 on 8/29> 13.2 (admit)>>> 9.5>> 8.6.  Denies rectal bleed other than the two large hematochezia he had on admission.  Patient had extensive work-up in the past.  Has history of diverticular bleed.  H&H is downtrending.  Hemodynamically stable. -Appreciate guidance on care by GI -Continue monitoring -Check anemia panel -Soft diet per GI -GI recommended holding Plavix for additional 2 days assuming that no further GI bleed -Bleeding scan if Hgb continues to drop.  History of CVA: Stable. -Continue home started -Plavix on hold as above  Prediabetes: A1c 5.8%.  On Metformin at home. Recent Labs  Lab 01/06/20 1723 01/06/20 2022 01/07/20 0735 01/07/20 1140  GLUCAP 131* 127* 140* 128*  -Continue SSI  Chronic steroid use: Chronic use of prednisone for "sinusitis" -Recommend weaning from this  Essential hypertension?  Does not seem to be on medication.  Normotensive. -Continue monitoring  Restless leg syndrome -Continue home Requip  Insomnia -On low-dose Ambien  Body mass index is 23.05 kg/m.         DVT prophylaxis:  SCDs Start: 01/06/20 0214  Code Status: DNR/DNI Family Communication: Patient and/or RN. Available if any question.  Status is: Inpatient  Remains inpatient appropriate because:Inpatient level of care appropriate due to severity of illness and GI bleed with drop in hemoglobin  Dispo: The patient is from: Home              Anticipated d/c is to: Home              Anticipated d/c date is: 1 day              Patient currently is not medically stable to d/c.       Consultants:  Gastroenterology   Sch Meds:  Scheduled Meds: . budesonide  2 mL Nebulization BID  . insulin aspart  0-15 Units Subcutaneous TID WC  . insulin aspart  0-5 Units Subcutaneous QHS  . [START ON 01/08/2020] predniSONE  5 mg Oral Daily  .  rOPINIRole  3 mg Oral QHS  . simvastatin  40 mg Oral QHS  . zolpidem  5 mg Oral QHS   Continuous Infusions: PRN Meds:.acetaminophen **OR** acetaminophen, albuterol, montelukast  Antimicrobials: Anti-infectives (From admission, onward)   None       I have personally reviewed the following labs and images: CBC: Recent Labs  Lab 01/05/20 2308 01/05/20 2308 01/06/20 0612 01/06/20 1105 01/06/20 1538 01/07/20 0848 01/07/20 1458  WBC 7.3   < > 9.1 10.8* 9.5 10.2 10.4  NEUTROABS 3.9  --   --   --   --   --   --   HGB 13.2   < > 10.7* 10.4* 9.4* 9.5* 8.6*  HCT 42.3   < > 34.8* 33.4* 30.7* 30.5* 28.1*  MCV 90.4   < > 90.9 91.0 92.2 90.8 93.0  PLT 348   < > 306 305 274 287 289   < > = values in this interval not displayed.   BMP &GFR Recent Labs  Lab 01/05/20 2308  NA 137  K 4.3  CL 102  CO2 26  GLUCOSE 135*  BUN 17  CREATININE 0.79  CALCIUM 8.6*   Estimated Creatinine Clearance: 66.3 mL/min (by C-G formula based on SCr of 0.79 mg/dL). Liver & Pancreas: Recent Labs  Lab 01/05/20 2308  AST 18  ALT 11  ALKPHOS 62  BILITOT 0.4  PROT 6.2*  ALBUMIN 3.6   No results for input(s): LIPASE, AMYLASE in the last 168 hours. No results for input(s): AMMONIA in the last 168 hours. Diabetic: Recent Labs    01/06/20 1538  HGBA1C 5.8*   Recent Labs  Lab 01/06/20 1723 01/06/20 2022 01/07/20 0735 01/07/20 1140  GLUCAP 131* 127* 140* 128*   Cardiac Enzymes: No results for input(s): CKTOTAL, CKMB, CKMBINDEX, TROPONINI in the last 168 hours. No results for input(s): PROBNP in the last 8760 hours. Coagulation Profile: Recent Labs  Lab 01/05/20 2308  INR 1.0   Thyroid Function Tests: No results for input(s): TSH, T4TOTAL, FREET4, T3FREE, THYROIDAB in the last 72 hours. Lipid Profile: No results for input(s): CHOL, HDL, LDLCALC, TRIG, CHOLHDL, LDLDIRECT in the last 72 hours. Anemia Panel: No results for input(s): VITAMINB12, FOLATE, FERRITIN, TIBC, IRON, RETICCTPCT  in the last 72 hours. Urine analysis:    Component Value Date/Time   COLORURINE STRAW (A) 04/21/2018 2320   APPEARANCEUR CLEAR 04/21/2018 2320   LABSPEC 1.013 04/21/2018 2320   PHURINE 6.0 04/21/2018 2320   GLUCOSEU NEGATIVE 04/21/2018 2320   HGBUR NEGATIVE 04/21/2018 2320   BILIRUBINUR NEGATIVE 04/21/2018 2320   KETONESUR NEGATIVE 04/21/2018 2320   PROTEINUR NEGATIVE 04/21/2018 2320   UROBILINOGEN 0.2 12/22/2013 1210   NITRITE NEGATIVE 04/21/2018 2320   LEUKOCYTESUR NEGATIVE 04/21/2018 2320   Sepsis Labs: Invalid input(s): PROCALCITONIN, LACTICIDVEN  Microbiology: Recent Results (from the past 240 hour(s))  Resp Panel by RT-PCR (Flu A&B, Covid) Nasopharyngeal Swab     Status: None   Collection Time: 01/05/20 11:13 PM   Specimen: Nasopharyngeal Swab; Nasopharyngeal(NP) swabs in vial transport medium  Result Value Ref Range Status   SARS Coronavirus 2 by RT PCR NEGATIVE NEGATIVE Final    Comment: (NOTE) SARS-CoV-2 target nucleic acids are NOT DETECTED.  The SARS-CoV-2 RNA is generally detectable in upper respiratory specimens during the acute phase of infection. The lowest concentration of SARS-CoV-2 viral copies this assay can detect is 138 copies/mL. A negative result does not preclude SARS-Cov-2 infection and should not be used as the sole basis for treatment or other patient management decisions. A negative result may occur with  improper specimen collection/handling, submission of specimen other than nasopharyngeal swab, presence of viral mutation(s) within the areas targeted by this assay, and inadequate number of viral copies(<138 copies/mL). A negative result must be combined with clinical observations, patient history, and epidemiological information. The expected result is Negative.  Fact Sheet for Patients:  EntrepreneurPulse.com.au  Fact Sheet for Healthcare Providers:  IncredibleEmployment.be  This test is no t yet  approved or cleared by the [place] FDA and  has been authorized for detection and/or diagnosis of SARS-CoV-2 by FDA under an Emergency Use Authorization (EUA). This EUA will remain  in effect (meaning this test can be used) for the duration of the COVID-19 declaration under Section 564(b)(1) of the Act, 21 U.S.C.section 360bbb-3(b)(1), unless the authorization is terminated  or revoked sooner.       Influenza A by PCR NEGATIVE NEGATIVE Final   Influenza B by PCR NEGATIVE NEGATIVE Final    Comment: (NOTE) The Xpert Xpress SARS-CoV-2/FLU/RSV plus assay is intended as an aid in the diagnosis of influenza from Nasopharyngeal swab specimens and should not be used as a sole basis for treatment. Nasal washings and aspirates are unacceptable for Xpert Xpress SARS-CoV-2/FLU/RSV testing.  Fact Sheet for Patients: EntrepreneurPulse.com.au  Fact Sheet for Healthcare Providers: IncredibleEmployment.be  This test is not yet approved or cleared by the [place] FDA and has been authorized for detection and/or diagnosis of SARS-CoV-2 by FDA under an Emergency Use Authorization (EUA). This EUA will remain in effect (meaning this test can be used) for the duration of the COVID-19 declaration under Section 564(b)(1) of the Act, 21 U.S.C. section 360bbb-3(b)(1), unless the authorization is terminated or revoked.  Performed at [facility], [place] [address], [place], [place] [address]     Radiology Studies: No results found.   Taleen Prosser T. Cavalier  If 7PM-7AM, please contact night-coverage [email or web address] 01/07/2020, 4:57 PM

## 2020-01-08 ENCOUNTER — Telehealth: Payer: Self-pay

## 2020-01-08 DIAGNOSIS — D500 Iron deficiency anemia secondary to blood loss (chronic): Secondary | ICD-10-CM

## 2020-01-08 DIAGNOSIS — K922 Gastrointestinal hemorrhage, unspecified: Secondary | ICD-10-CM

## 2020-01-08 DIAGNOSIS — K5791 Diverticulosis of intestine, part unspecified, without perforation or abscess with bleeding: Secondary | ICD-10-CM | POA: Diagnosis not present

## 2020-01-08 DIAGNOSIS — Z66 Do not resuscitate: Secondary | ICD-10-CM | POA: Diagnosis not present

## 2020-01-08 DIAGNOSIS — Z8673 Personal history of transient ischemic attack (TIA), and cerebral infarction without residual deficits: Secondary | ICD-10-CM

## 2020-01-08 DIAGNOSIS — Z8719 Personal history of other diseases of the digestive system: Secondary | ICD-10-CM

## 2020-01-08 LAB — CBC
HCT: 25.5 % — ABNORMAL LOW (ref 39.0–52.0)
HCT: 26.7 % — ABNORMAL LOW (ref 39.0–52.0)
Hemoglobin: 7.9 g/dL — ABNORMAL LOW (ref 13.0–17.0)
Hemoglobin: 8.3 g/dL — ABNORMAL LOW (ref 13.0–17.0)
MCH: 28.3 pg (ref 26.0–34.0)
MCH: 28.6 pg (ref 26.0–34.0)
MCHC: 31 g/dL (ref 30.0–36.0)
MCHC: 31.1 g/dL (ref 30.0–36.0)
MCV: 91.4 fL (ref 80.0–100.0)
MCV: 92.1 fL (ref 80.0–100.0)
Platelets: 274 10*3/uL (ref 150–400)
Platelets: 304 10*3/uL (ref 150–400)
RBC: 2.79 MIL/uL — ABNORMAL LOW (ref 4.22–5.81)
RBC: 2.9 MIL/uL — ABNORMAL LOW (ref 4.22–5.81)
RDW: 18.7 % — ABNORMAL HIGH (ref 11.5–15.5)
RDW: 19 % — ABNORMAL HIGH (ref 11.5–15.5)
WBC: 8.4 10*3/uL (ref 4.0–10.5)
WBC: 9.3 10*3/uL (ref 4.0–10.5)
nRBC: 0 % (ref 0.0–0.2)
nRBC: 0 % (ref 0.0–0.2)

## 2020-01-08 LAB — RETICULOCYTES
Immature Retic Fract: 16.2 % — ABNORMAL HIGH (ref 2.3–15.9)
RBC.: 2.8 MIL/uL — ABNORMAL LOW (ref 4.22–5.81)
Retic Count, Absolute: 41.7 10*3/uL (ref 19.0–186.0)
Retic Ct Pct: 1.5 % (ref 0.4–3.1)

## 2020-01-08 LAB — FERRITIN: Ferritin: 13 ng/mL — ABNORMAL LOW (ref 24–336)

## 2020-01-08 LAB — GLUCOSE, CAPILLARY
Glucose-Capillary: 110 mg/dL — ABNORMAL HIGH (ref 70–99)
Glucose-Capillary: 123 mg/dL — ABNORMAL HIGH (ref 70–99)

## 2020-01-08 LAB — VITAMIN B12: Vitamin B-12: 182 pg/mL (ref 180–914)

## 2020-01-08 LAB — IRON AND TIBC
Iron: 12 ug/dL — ABNORMAL LOW (ref 45–182)
Saturation Ratios: 4 % — ABNORMAL LOW (ref 17.9–39.5)
TIBC: 310 ug/dL (ref 250–450)
UIBC: 298 ug/dL

## 2020-01-08 LAB — FOLATE: Folate: 8.3 ng/mL (ref >5.9)

## 2020-01-08 MED ORDER — SODIUM CHLORIDE 0.9 % IV SOLN
510.0000 mg | Freq: Once | INTRAVENOUS | Status: AC
  Administered 2020-01-08: 510 mg via INTRAVENOUS
  Filled 2020-01-08: qty 510

## 2020-01-08 MED ORDER — CLOPIDOGREL BISULFATE 75 MG PO TABS
75.0000 mg | ORAL_TABLET | Freq: Every day | ORAL | Status: DC
Start: 2020-01-10 — End: 2020-01-22

## 2020-01-08 NOTE — Telephone Encounter (Signed)
-----   Message from Lavena Bullion, DO sent at 01/08/2020  1:53 PM EST ----- Planning on discharge from hospital today.  Can you please arrange for the following:-Repeat CBC in 7-10 days-Repeat CBC and iron panel in about 2 months-Can arrange for outpatient follow-up in 6 weeks or soThank you.

## 2020-01-08 NOTE — Progress Notes (Signed)
Progress Note  Chief Complaint:   Hemoglobin falling     ASSESSMENT / PLAN:    # GI bleed, presumably lower with history of the same in February 2021 and March 2021. In Feb 2021 CTA was negative but bleeding scan suggested bleeding from right colon but arteriogram by IR 04/02/19 was negative.  Colonoscopy 04/04/19 remarkable for melenic liquid stool throughout colon raising concern for more proximal bleed. EGD and. Meckel's scan were negative. During August bleed, CTA was negative.  He required 3-4 units of blood that admission. This admission patient presented with maroon blood. No BMs / bleeding in two days however his hgb was continued to slowly decline. In the last 24 hours hgb has declined from 9.5 to 7.8. On rectal exam there is a scant amount of very dark brown stool  --Patient really wants to go home today. I do have some concern about declining hgb, dark stool on DRE. Additionally he is due to resume Plavix tomorrow. Let's at least wait on the next CBC to make sure hgb isn't continuing to decline.        SUBJECTIVE:   No complaints. No BMs or bleeding in two days.     OBJECTIVE:    Scheduled inpatient medications:  . budesonide  2 mL Nebulization BID  . insulin aspart  0-15 Units Subcutaneous TID WC  . insulin aspart  0-5 Units Subcutaneous QHS  . predniSONE  5 mg Oral Daily  . rOPINIRole  3 mg Oral QHS  . simvastatin  40 mg Oral QHS  . zolpidem  5 mg Oral QHS   Continuous inpatient infusions:  . ferumoxytol     PRN inpatient medications: acetaminophen **OR** acetaminophen, albuterol, montelukast  Vital signs in last 24 hours: Temp:  [97.3 F (36.3 C)-98.1 F (36.7 C)] 98.1 F (36.7 C) (11/22 0535) Pulse Rate:  [84-100] 84 (11/22 0535) Resp:  [16-18] 18 (11/22 0535) BP: (110-117)/(66-80) 117/80 (11/22 0535) SpO2:  [96 %-100 %] 96 % (11/22 0535) Last BM Date: 01/07/20  Intake/Output Summary (Last 24 hours) at 01/08/2020 0958 Last data filed at 01/08/2020  0730 Gross per 24 hour  Intake --  Output 1625 ml  Net -1625 ml     Physical Exam:  . General: Alert, male in NAD. In bedside chair . Heart:  Regular rate  . Pulmonary: Normal respiratory effort . Abdomen: Soft, nondistended, nontender. Normal bowel sounds.  . Neurologic: Alert and oriented . Psych: Pleasant. Cooperative.   Filed Weights   01/06/20 0211  Weight: 70.8 kg    Intake/Output from previous day: 11/21 0701 - 11/22 0700 In: -  Out: 1425 [Urine:1425] Intake/Output this shift: Total I/O In: -  Out: 200 [Urine:200]    Lab Results: Recent Labs    01/07/20 0848 01/07/20 1458 01/08/20 0539  WBC 10.2 10.4 8.4  HGB 9.5* 8.6* 7.9*  HCT 30.5* 28.1* 25.5*  PLT 287 289 274   BMET Recent Labs    01/05/20 2308  NA 137  K 4.3  CL 102  CO2 26  GLUCOSE 135*  BUN 17  CREATININE 0.79  CALCIUM 8.6*   LFT Recent Labs    01/05/20 2308  PROT 6.2*  ALBUMIN 3.6  AST 18  ALT 11  ALKPHOS 62  BILITOT 0.4   PT/INR Recent Labs    01/05/20 2308  LABPROT 12.5  INR 1.0   Hepatitis Panel No results for input(s): HEPBSAG, HCVAB, HEPAIGM, HEPBIGM in the last 72 hours.  No results  found.    Principal Problem:   Acute GI bleeding Active Problems:   History of CVA (cerebrovascular accident)   Benign essential HTN   GI bleed   DNR (do not resuscitate)     LOS: 2 days   Tye Savoy ,NP 01/08/2020, 9:58 AM

## 2020-01-08 NOTE — Evaluation (Signed)
Occupational Therapy Evaluation Patient Details Name: [name] MRN: [id number] DOB: February 13, 1932 Today's Date: 01/08/2020    History of Present Illness 86 year old male with history of recurrent diverticular bleed with previous extensive work-up including nuclear scan, EGD and colonoscopy presenting with hematochezia.  Patient is on Plavix. GI consulted, signed off   Clinical Impression   Patient is currently requiring assistance with ADLs including moderate assistance LB dressing, minimal assist with bathing, min guard with toileting, all of which is below patient's typical baseline of being Modified independent.  During this evaluation, patient was limited by generalized weakness and impaired activity tolerance, which has the potential to impact patient's safety and independence during functional mobility, as well as performance for ADLs. [place] "6-clicks" Daily Activity Inpatient Short Form score of 18/24 indicates 46.65% ADL impairment this session. Patient lives with his spouse, who is able to provide 24/7 supervision and light assistance.  Patient demonstrates good rehab potential, and should benefit from continued skilled occupational therapy services while in acute care to maximize safety, independence and quality of life at home.   ?    Follow Up Recommendations  No OT follow up    Equipment Recommendations       Recommendations for Other Services       Precautions / Restrictions Precautions Precautions: Fall Required Braces or Orthoses:  (Pt uses LT Knee brace and LT AFO at baseline) Restrictions Weight Bearing Restrictions: No      Mobility Bed Mobility               General bed mobility comments: Pt was up in recliner.    Transfers Overall transfer level: Needs assistance Equipment used: Rolling walker (2 wheeled) Transfers: Sit to/from Stand Sit to Stand: Min guard         General transfer comment: Pt ambulated in hallway with RW  and Min guard assist. Pt with some possible LT sided visual field cuts as walker would drift to LT and run into door openings and furniture.    Balance Overall balance assessment: Needs assistance Sitting-balance support: No upper extremity supported;Feet supported Sitting balance-Leahy Scale: Good       Standing balance-Leahy Scale: Poor Standing balance comment: Required BUE support on RW.                           ADL either performed or assessed with clinical judgement   ADL Overall ADL's : Needs assistance/impaired Eating/Feeding: Independent   Grooming: Wash/dry hands;Set up;Sitting   Upper Body Bathing: Set up;Sitting;Supervision/ safety   Lower Body Bathing: Minimal assistance;Sitting/lateral leans;Sit to/from stand   Upper Body Dressing : Supervision/safety;Set up;Sitting   Lower Body Dressing: Moderate assistance;Sitting/lateral leans Lower Body Dressing Details (indicate cue type and reason): Pt doffoed socks with Min As sitting. Pt donned LT AFO and shoe with Min As and increased time/effort leaning down to foot.  Donned RT shoe with moderate assistance due to fatigue after dressing LT side. Toilet Transfer: RW;Grab bars;Regular Toilet;Min Psychiatric nurse Details (indicate cue type and reason): Min guard to descend to toilet with verbal cues for use of wall mounted grab bar. Pt stood from toilet with supervision and use of grab bar with RUE. Toileting- Water quality scientist and Hygiene: Min guard;Sit to/from stand Toileting - Clothing Manipulation Details (indicate cue type and reason): Min guard for clothing mngt.     Functional mobility during ADLs: Min guard;Rolling walker       Vision Baseline  Vision/History: Wears glasses Wears Glasses: At all times Patient Visual Report: No change from baseline Vision Assessment?: No apparent visual deficits     Perception     Praxis      Pertinent Vitals/Pain Pain Assessment: No/denies pain      Hand Dominance Right   Extremity/Trunk Assessment Upper Extremity Assessment Upper Extremity Assessment: Generalized weakness;LUE deficits/detail LUE Deficits / Details: Residual weakness from old CVA with shoulder ROM to ~ 110 degrees, proximal strength of ~ 3+/5, and distal MMT to 4/5   Lower Extremity Assessment Lower Extremity Assessment: Defer to PT evaluation       Communication Communication Communication: No difficulties   Cognition Arousal/Alertness: Awake/alert Behavior During Therapy: WFL for tasks assessed/performed Overall Cognitive Status: Within Functional Limits for tasks assessed                                 General Comments: A&Ox4   General Comments       Exercises     Shoulder Instructions      Home Living Family/patient expects to be discharged to:: Private residence Living Arrangements: Spouse/significant other Available Help at Discharge: Family;Available 24 hours/day Type of Home: Independent living facility Home Access: Level entry     Home Layout: One level     Bathroom Shower/Tub: Occupational psychologist: Handicapped height Bathroom Accessibility: Yes How Accessible: Accessible via walker Home Equipment: Grab bars - tub/shower;Grab bars - toilet;Walker - 2 wheels;Walker - 4 wheels;Wheelchair - manual;Hand held shower head;Shower seat - built in   Additional Comments: Transport chair AND manual WC.   Pt resides at [facility]      Prior Functioning/Environment Level of Independence: Independent with assistive device(s)        Comments: Uses rollator - can walk community distances if he has to but typically does not; independent with ADLs- IADLs partially managed by facility and wife.        OT Problem List: Decreased strength;Decreased activity tolerance;Impaired balance (sitting and/or standing);Impaired UE functional use      OT Treatment/Interventions: Self-care/ADL training;Therapeutic  exercise;Therapeutic activities;Energy conservation;Visual/perceptual remediation/compensation;Patient/family education;Balance training;DME and/or AE instruction    OT Goals(Current goals can be found in the care plan section) Acute Rehab OT Goals Patient Stated Goal: To increase strength OT Goal Formulation: With patient Time For Goal Achievement: 01/22/20 Potential to Achieve Goals: Good ADL Goals Pt Will Perform Grooming: standing;with modified independence Pt Will Perform Lower Body Dressing: with modified independence;with adaptive equipment;sit to/from stand Pt Will Transfer to Toilet: with modified independence Pt Will Perform Toileting - Clothing Manipulation and hygiene: with modified independence Pt Will Perform Tub/Shower Transfer: with supervision;Shower transfer;grab bars Pt/caregiver will Perform Home Exercise Program: Both right and left upper extremity;With theraband;Increased strength;With written HEP provided;Independently  OT Frequency: Min 2X/week   Barriers to D/C:            Co-evaluation              AM-PAC OT "6 Clicks" Daily Activity     Outcome Measure Help from another person eating meals?: None Help from another person taking care of personal grooming?: A Little Help from another person toileting, which includes using toliet, bedpan, or urinal?: A Little Help from another person bathing (including washing, rinsing, drying)?: A Little Help from another person to put on and taking off regular upper body clothing?: A Little Help from another person to put on and  taking off regular lower body clothing?: A Lot 6 Click Score: 18   End of Session Equipment Utilized During Treatment: Gait belt;Rolling walker Nurse Communication: Mobility status  Activity Tolerance: Patient tolerated treatment well Patient left: in chair;with call bell/phone within reach;with chair alarm set  OT Visit Diagnosis: Muscle weakness (generalized) (M62.81)                 Time: 9795-3692 OT Time Calculation (min): 25 min Charges:  OT General Charges $OT Visit: 1 Visit OT Evaluation $OT Eval Low Complexity: 1 Low  Ayaan Shutes, OT Acute Rehab Services Office: [phone] 01/08/2020  Julien Girt 01/08/2020, 1:23 PM

## 2020-01-08 NOTE — Evaluation (Signed)
Physical Therapy Evaluation Patient Details Name: [name] MRN: [id number] DOB: November 19, 1931 Today's Date: 01/08/2020   History of Present Illness  86 year old male with history of recurrent diverticular bleed with previous extensive work-up including nuclear scan, EGD and colonoscopy presenting with hematochezia.  Patient is on Plavix. GI consulted, signed off  Clinical Impression  Patient evaluated by Physical Therapy with no further acute PT needs identified. All education has been completed and the patient has no further questions.  Pt  amb hallway distance with min/guard assist. Would benefit from HHPT vs OPPT, pt does not feel he needs it, wife thinks it would be a good idea. They report they can contact PT at [facility].  Pt to d/c home today. No DME needs  See below for any follow-up Physical Therapy or equipment needs. PT is signing off. Thank you for this referral.     Follow Up Recommendations Home health PT (would benefit, pt declines, wife endorses the need for PT)    Equipment Recommendations  None recommended by PT    Recommendations for Other Services       Precautions / Restrictions Precautions Precautions: Fall Required Braces or Orthoses: Other Brace Other Brace: L knee brace and L AFO Restrictions Weight Bearing Restrictions: No      Mobility  Bed Mobility               General bed mobility comments: Pt was up in recliner.    Transfers Overall transfer level: Needs assistance Equipment used: Rolling walker (2 wheeled) Transfers: Sit to/from Stand Sit to Stand: Supervision         General transfer comment: for safety on rising, cues to control desecent  Ambulation/Gait Ambulation/Gait assistance: Min guard;Supervision Gait Distance (Feet): 150 Feet Assistive device: Rolling walker (2 wheeled) Gait Pattern/deviations: Step-through pattern;Decreased stride length;Trunk flexed;Decreased dorsiflexion - left (decr df with AFO in place, incr time for  toe clearance) Gait velocity: decr   General Gait Details: cues for trunk extension, upward gaze as possible. pt unsteady initially however no overt LOB, able to note milk spill on floor in hallway and navigate around it.  no overt LOB (denies any recent falls)  Financial trader [employer] (Stroke Patients Only)       Balance Overall balance assessment: Needs assistance Sitting-balance support: No upper extremity supported;Feet supported Sitting balance-Leahy Scale: Good       Standing balance-Leahy Scale: Fair Standing balance comment: able to stand statically with wide BOS. reliant on UEs for dynamic balance, wt shifting                             Pertinent Vitals/Pain Pain Assessment: No/denies pain    Home Living Family/patient expects to be discharged to:: Private residence Living Arrangements: Spouse/significant other Available Help at Discharge: Family;Available 24 hours/day Type of Home: Independent living facility Home Access: Level entry     Home Layout: One level Home Equipment: Grab bars - tub/shower;Grab bars - toilet;Walker - 2 wheels;Walker - 4 wheels;Wheelchair - manual;Hand held shower head;Shower seat - built in Additional Comments: Transport chair AND manual WC.   Pt resides at [facility]    Prior Function Level of Independence: Independent with assistive device(s)         Comments: Uses rollator - can walk community distances if he has to but typically does not; independent with  ADLs- IADLs partially managed by facility and wife. uses golf cart to get around [place] Hand: Right    Extremity/Trunk Assessment   Upper Extremity Assessment Upper Extremity Assessment: Defer to OT evaluation LUE Deficits / Details: Residual weakness from old CVA with shoulder ROM to ~ 110 degrees, proximal strength of ~ 3+/5, and distal MMT to 4/5    Lower Extremity  Assessment Lower Extremity Assessment: LLE deficits/detail;RLE deficits/detail RLE Deficits / Details: generalized weakness LLE Deficits / Details: baseline deficits d/t old CVA, wears Knee brace and AFO       Communication   Communication: No difficulties  Cognition Arousal/Alertness: Awake/alert Behavior During Therapy: WFL for tasks assessed/performed Overall Cognitive Status: Within Functional Limits for tasks assessed                                 General Comments: A&Ox4      General Comments      Exercises     Assessment/Plan    PT Assessment All further PT needs can be met in the next venue of care  PT Problem List         PT Treatment Interventions      PT Goals (Current goals can be found in the Care Plan section)  Acute Rehab PT Goals Patient Stated Goal: To increase strength PT Goal Formulation: All assessment and education complete, DC therapy    Frequency     Barriers to discharge        Co-evaluation               AM-PAC PT "6 Clicks" Mobility  Outcome Measure Help needed turning from your back to your side while in a flat bed without using bedrails?: None Help needed moving from lying on your back to sitting on the side of a flat bed without using bedrails?: None Help needed moving to and from a bed to a chair (including a wheelchair)?: A Little Help needed standing up from a chair using your arms (e.g., wheelchair or bedside chair)?: None Help needed to walk in hospital room?: A Little Help needed climbing 3-5 steps with a railing? : A Little 6 Click Score: 21    End of Session Equipment Utilized During Treatment: Gait belt Activity Tolerance: Patient tolerated treatment well Patient left: in chair;with call bell/phone within reach;with family/visitor present   PT Visit Diagnosis: Other abnormalities of gait and mobility (R26.89)    Time: 0354-6568 PT Time Calculation (min) (ACUTE ONLY): 19 min   Charges:   PT  Evaluation $PT Eval Low Complexity: [place], PT  Acute Rehab Dept ([place]) [phone] Pager [phone]  01/08/2020   [facility] 01/08/2020, 2:59 PM

## 2020-01-08 NOTE — Discharge Summary (Signed)
Physician Discharge Summary  EMMANUEL GRUENHAGEN ZCH:[id number] DOB: 1931-06-26 DOA: 01/05/2020  PCP: Seward Carol, MD  Admit date: 01/05/2020 Discharge date: 01/08/2020  Admitted From: Home Disposition: Home  Recommendations for Outpatient Follow-up:  1. Follow ups as below. 2. Please obtain CBC/BMP/Mag at follow up 3. Please follow up on the following pending results: None  Home Health: None required Equipment/Devices: None required  Discharge Condition: Stable CODE STATUS: DNR/DNI   Follow-up Information    Seward Carol, MD. Schedule an appointment as soon as possible for a visit in 1 week(s).   Specialty: Internal Medicine Contact information: [address] [place] [address] [phone]               Hospital Course: 86 year old male with history of recurrent diverticular bleed with previous extensive work-up including nuclear scan, EGD and colonoscopy presenting with hematochezia.  Patient is on Plavix.  In ED, hemodynamically stable.  Hgb 13 (9.8 about 3 months ago).  Patient was admitted given recurrence and large volume bleeding.  GI consulted.  Patient has not had further GI bleed.  Hemoglobin remained stable after initial drop.  Anemia panel consistent with iron deficiency.  He received IV iron.  He was cleared for discharge by gastroenterology for outpatient follow-up in 7 to 10 days for repeat CBC.  GI to arrange outpatient follow-up.  Hemoglobin 8.3 at the time of discharge.  He is discharged on p.o. iron.  Patient to hold Plavix for 2 more days.  He was encouraged to use bowel regimen to avoid constipation.  See individual problem list below for more on hospital course.  Discharge Diagnoses:  Acute blood loss anemia due to diverticular bleed: Hgb 9.8 on 8/29> 13.2 (admit)>>> 9.5>>  7.9> 8.3. No further GI bleed.   Anemia panel consistent with iron deficiency.  Received IV iron.  Cleared for discharge by GI. -Recheck CBC in 7 to 10  days -Repeat CBC and anemia panel in 1 to 2 months -Patient to resume Plavix in 2 days after discharge -Encouraged to avoid constipation.  History of CVA: Stable. -Continue home statin -Hold Plavix for 2 more days  Prediabetes: A1c 5.8%.  On Metformin at home. -Continue home medications  Chronic steroid use: Chronic use of prednisone for "sinusitis" -Recommend weaning from this  Essential hypertension?  Does not seem to be on medication.  Normotensive.  Restless leg syndrome -Continue home Requip  Insomnia -Continue home medications  Body mass index is 23.05 kg/m.            Discharge Exam: Vitals:   01/07/20 2037 01/08/20 0535  BP:  117/80  Pulse:  84  Resp:  18  Temp:  98.1 F (36.7 C)  SpO2: 99% 96%    GENERAL: No apparent distress.  Nontoxic. HEENT: MMM.  Vision and hearing grossly intact.  NECK: Supple.  No apparent JVD.  RESP:  No IWOB.  Fair aeration bilaterally. CVS:  RRR. Heart sounds normal.  ABD/GI/GU: Bowel sounds present. Soft. Non tender.  MSK/EXT:  Moves extremities. No apparent deformity. No edema.  SKIN: no apparent skin lesion or wound NEURO: Awake, alert and oriented appropriately.  No apparent focal neuro deficit. PSYCH: Calm. Normal affect.  Discharge Instructions  Discharge Instructions    Call MD for:  difficulty breathing, headache or visual disturbances   Complete by: As directed    Call MD for:  extreme fatigue   Complete by: As directed    Call MD for:  persistant dizziness  or light-headedness   Complete by: As directed    Diet general   Complete by: As directed    Discharge instructions   Complete by: As directed    It has been a pleasure taking care of you!  You were hospitalized due to rectal bleed likely from diverticulosis.  Your bleeding seems to have subsided.  We recommend holding your Plavix at least for 1 week.  We also recommend you avoid any over-the-counter pain medication other than plain Tylenol.   Please monitor your stool for any signs of bleeding or dark tarry stool.  Follow-up with your primary care doctor in 1 to 2 weeks.   Take care,   Discharge instructions   Complete by: As directed    It has been a pleasure taking care of you!  You were hospitalized due to rectal bleeding likely from diverticulosis.  Your bleeding seems to have subsided.  We have given you iron infusion to help your body make some red blood cells as well.  Continue taking your iron pills at home.  Resume your Plavix on 01/10/2020.  Follow-up with your primary care doctor in 1 week to have your blood levels rechecked.  Your gastroenterologist will arrange outpatient follow-up as well.   Take care,   Increase activity slowly   Complete by: As directed    Increase activity slowly   Complete by: As directed      Allergies as of 01/08/2020      Reactions   Tape Other (See Comments)   SKIN IS SENSITIVE; PLEASE USE PAPER TAPE; SKIN BRUISES AND TEARS EASILY!!   Azithromycin Rash, Other (See Comments)   Pt had a Z-Pak Jan, 2020 - no reaction(??)      Medication List    TAKE these medications   ProAir HFA 108 (90 Base) MCG/ACT inhaler Generic drug: albuterol Inhale 2 puffs into the lungs every 6 (six) hours as needed for wheezing or shortness of breath.   albuterol (2.5 MG/3ML) 0.083% nebulizer solution Commonly known as: PROVENTIL Take 3 mLs by nebulization every 6 (six) hours as needed for wheezing or shortness of breath.   budesonide 0.5 MG/2ML nebulizer solution Commonly known as: PULMICORT Take 2 mLs by nebulization 2 (two) times daily.   clopidogrel 75 MG tablet Commonly known as: PLAVIX Take 1 tablet (75 mg total) by mouth daily. Start only after a week Start taking on: January 10, 2020 What changed: These instructions start on January 10, 2020. If you are unsure what to do until then, ask your doctor or other care provider.   diphenhydramine-acetaminophen 25-500 MG Tabs tablet Commonly  known as: TYLENOL PM Take 1 tablet by mouth at bedtime.   ferrous sulfate 325 (65 FE) MG tablet Take 1 tablet (325 mg total) by mouth daily.   fluticasone 50 MCG/ACT nasal spray Commonly known as: FLONASE Place 1 spray into both nostrils daily as needed for allergies or rhinitis.   Melatonin 10 MG Tabs Take 10 mg by mouth at bedtime as needed (for sleep).   metFORMIN 500 MG tablet Commonly known as: GLUCOPHAGE Take 500 mg by mouth daily.   montelukast 10 MG tablet Commonly known as: SINGULAIR Take 10 mg by mouth at bedtime as needed (for breathing flares).   polyethylene glycol 17 g packet Commonly known as: MIRALAX / GLYCOLAX Take 17 g by mouth daily as needed for mild constipation (MIX AND DRINK).   predniSONE 10 MG tablet Commonly known as: DELTASONE Take 5 mg by mouth daily.  psyllium 58.6 % powder Commonly known as: METAMUCIL Take 1 packet by mouth daily as needed (for constipation- MIX AND DRINK).   rOPINIRole 3 MG tablet Commonly known as: REQUIP Take 1 tablet (3 mg total) by mouth at bedtime.   simvastatin 40 MG tablet Commonly known as: ZOCOR Take 1 tablet (40 mg total) by mouth daily. What changed: when to take this   traMADol 50 MG tablet Commonly known as: ULTRAM Take 50 mg by mouth as needed for pain.       Consultations:  Gastroenterology  Procedures/Studies:    No results found.     The results of significant diagnostics from this hospitalization (including imaging, microbiology, ancillary and laboratory) are listed below for reference.     Microbiology: Recent Results (from the past 240 hour(s))  Resp Panel by RT-PCR (Flu A&B, Covid) Nasopharyngeal Swab     Status: None   Collection Time: 01/05/20 11:13 PM   Specimen: Nasopharyngeal Swab; Nasopharyngeal(NP) swabs in vial transport medium  Result Value Ref Range Status   SARS Coronavirus 2 by RT PCR NEGATIVE NEGATIVE Final    Comment: (NOTE) SARS-CoV-2 target nucleic acids are  NOT DETECTED.  The SARS-CoV-2 RNA is generally detectable in upper respiratory specimens during the acute phase of infection. The lowest concentration of SARS-CoV-2 viral copies this assay can detect is 138 copies/mL. A negative result does not preclude SARS-Cov-2 infection and should not be used as the sole basis for treatment or other patient management decisions. A negative result may occur with  improper specimen collection/handling, submission of specimen other than nasopharyngeal swab, presence of viral mutation(s) within the areas targeted by this assay, and inadequate number of viral copies(<138 copies/mL). A negative result must be combined with clinical observations, patient history, and epidemiological information. The expected result is Negative.  Fact Sheet for Patients:  EntrepreneurPulse.com.au  Fact Sheet for Healthcare Providers:  IncredibleEmployment.be  This test is no t yet approved or cleared by the [place] FDA and  has been authorized for detection and/or diagnosis of SARS-CoV-2 by FDA under an Emergency Use Authorization (EUA). This EUA will remain  in effect (meaning this test can be used) for the duration of the COVID-19 declaration under Section 564(b)(1) of the Act, 21 U.S.C.section 360bbb-3(b)(1), unless the authorization is terminated  or revoked sooner.       Influenza A by PCR NEGATIVE NEGATIVE Final   Influenza B by PCR NEGATIVE NEGATIVE Final    Comment: (NOTE) The Xpert Xpress SARS-CoV-2/FLU/RSV plus assay is intended as an aid in the diagnosis of influenza from Nasopharyngeal swab specimens and should not be used as a sole basis for treatment. Nasal washings and aspirates are unacceptable for Xpert Xpress SARS-CoV-2/FLU/RSV testing.  Fact Sheet for Patients: EntrepreneurPulse.com.au  Fact Sheet for Healthcare Providers: IncredibleEmployment.be  This test is not  yet approved or cleared by the [place] FDA and has been authorized for detection and/or diagnosis of SARS-CoV-2 by FDA under an Emergency Use Authorization (EUA). This EUA will remain in effect (meaning this test can be used) for the duration of the COVID-19 declaration under Section 564(b)(1) of the Act, 21 U.S.C. section 360bbb-3(b)(1), unless the authorization is terminated or revoked.  Performed at [facility], [place] [address], [place], [place] [address]      Labs: BNP (last 3 results) No results for input(s): BNP in the last 8760 hours. Basic Metabolic Panel: Recent Labs  Lab 01/05/20 2308  NA 137  K 4.3  CL 102  CO2 26  GLUCOSE 135*  BUN 17  CREATININE 0.79  CALCIUM 8.6*   Liver Function Tests: Recent Labs  Lab 01/05/20 2308  AST 18  ALT 11  ALKPHOS 62  BILITOT 0.4  PROT 6.2*  ALBUMIN 3.6   No results for input(s): LIPASE, AMYLASE in the last 168 hours. No results for input(s): AMMONIA in the last 168 hours. CBC: Recent Labs  Lab 01/05/20 2308 01/06/20 0612 01/06/20 1538 01/07/20 0848 01/07/20 1458 01/08/20 0539 01/08/20 1104  WBC 7.3   < > 9.5 10.2 10.4 8.4 9.3  NEUTROABS 3.9  --   --   --   --   --   --   HGB 13.2   < > 9.4* 9.5* 8.6* 7.9* 8.3*  HCT 42.3   < > 30.7* 30.5* 28.1* 25.5* 26.7*  MCV 90.4   < > 92.2 90.8 93.0 91.4 92.1  PLT 348   < > 274 287 289 274 304   < > = values in this interval not displayed.   Cardiac Enzymes: No results for input(s): CKTOTAL, CKMB, CKMBINDEX, TROPONINI in the last 168 hours. BNP: Invalid input(s): POCBNP CBG: Recent Labs  Lab 01/07/20 1140 01/07/20 1719 01/07/20 2031 01/08/20 0737 01/08/20 1121  GLUCAP 128* 93 161* 110* 123*   D-Dimer No results for input(s): DDIMER in the last 72 hours. Hgb A1c Recent Labs    01/06/20 1538  HGBA1C 5.8*   Lipid Profile No results for input(s): CHOL, HDL, LDLCALC, TRIG, CHOLHDL, LDLDIRECT in the last 72 hours. Thyroid function  studies No results for input(s): TSH, T4TOTAL, T3FREE, THYROIDAB in the last 72 hours.  Invalid input(s): FREET3 Anemia work up Recent Labs    01/08/20 0539  VITAMINB12 182  FOLATE 8.3  FERRITIN 13*  TIBC 310  IRON 12*  RETICCTPCT 1.5   Urinalysis    Component Value Date/Time   COLORURINE STRAW (A) 04/21/2018 2320   APPEARANCEUR CLEAR 04/21/2018 2320   LABSPEC 1.013 04/21/2018 2320   PHURINE 6.0 04/21/2018 2320   GLUCOSEU NEGATIVE 04/21/2018 2320   HGBUR NEGATIVE 04/21/2018 2320   BILIRUBINUR NEGATIVE 04/21/2018 2320   [place] 04/21/2018 2320   PROTEINUR NEGATIVE 04/21/2018 2320   UROBILINOGEN 0.2 12/22/2013 1210   NITRITE NEGATIVE 04/21/2018 2320   LEUKOCYTESUR NEGATIVE 04/21/2018 2320   Sepsis Labs Invalid input(s): PROCALCITONIN,  WBC,  LACTICIDVEN   Time coordinating discharge: 35 minutes  SIGNED:  Mercy Riding, MD  [facility] 01/08/2020, 2:09 PM  If 7PM-7AM, please contact night-coverage [email or web address]

## 2020-01-14 ENCOUNTER — Inpatient Hospital Stay (HOSPITAL_COMMUNITY)
Admission: EM | Admit: 2020-01-14 | Discharge: 2020-01-22 | DRG: 378 | Disposition: A | Discharge status: Skilled Nursing Facility | Payer: Medicare Other | Source: Skilled Nursing Facility | Attending: Internal Medicine | Admitting: Internal Medicine

## 2020-01-14 DIAGNOSIS — R55 Syncope and collapse: Secondary | ICD-10-CM | POA: Diagnosis not present

## 2020-01-14 DIAGNOSIS — Z7902 Long term (current) use of antithrombotics/antiplatelets: Secondary | ICD-10-CM

## 2020-01-14 DIAGNOSIS — K5521 Angiodysplasia of colon with hemorrhage: Principal | ICD-10-CM | POA: Diagnosis present

## 2020-01-14 DIAGNOSIS — G4700 Insomnia, unspecified: Secondary | ICD-10-CM | POA: Diagnosis present

## 2020-01-14 DIAGNOSIS — E785 Hyperlipidemia, unspecified: Secondary | ICD-10-CM | POA: Diagnosis present

## 2020-01-14 DIAGNOSIS — K222 Esophageal obstruction: Secondary | ICD-10-CM | POA: Diagnosis present

## 2020-01-14 DIAGNOSIS — K3189 Other diseases of stomach and duodenum: Secondary | ICD-10-CM | POA: Diagnosis not present

## 2020-01-14 DIAGNOSIS — J45909 Unspecified asthma, uncomplicated: Secondary | ICD-10-CM | POA: Diagnosis present

## 2020-01-14 DIAGNOSIS — Z79899 Other long term (current) drug therapy: Secondary | ICD-10-CM | POA: Diagnosis not present

## 2020-01-14 DIAGNOSIS — R933 Abnormal findings on diagnostic imaging of other parts of digestive tract: Secondary | ICD-10-CM | POA: Diagnosis not present

## 2020-01-14 DIAGNOSIS — I69354 Hemiplegia and hemiparesis following cerebral infarction affecting left non-dominant side: Secondary | ICD-10-CM | POA: Diagnosis not present

## 2020-01-14 DIAGNOSIS — D649 Anemia, unspecified: Secondary | ICD-10-CM | POA: Diagnosis not present

## 2020-01-14 DIAGNOSIS — Z9081 Acquired absence of spleen: Secondary | ICD-10-CM | POA: Diagnosis not present

## 2020-01-14 DIAGNOSIS — Z8673 Personal history of transient ischemic attack (TIA), and cerebral infarction without residual deficits: Secondary | ICD-10-CM

## 2020-01-14 DIAGNOSIS — Z7952 Long term (current) use of systemic steroids: Secondary | ICD-10-CM | POA: Diagnosis not present

## 2020-01-14 DIAGNOSIS — R42 Dizziness and giddiness: Secondary | ICD-10-CM

## 2020-01-14 DIAGNOSIS — Z66 Do not resuscitate: Secondary | ICD-10-CM | POA: Diagnosis present

## 2020-01-14 DIAGNOSIS — R195 Other fecal abnormalities: Secondary | ICD-10-CM | POA: Diagnosis not present

## 2020-01-14 DIAGNOSIS — I5042 Chronic combined systolic (congestive) and diastolic (congestive) heart failure: Secondary | ICD-10-CM | POA: Diagnosis present

## 2020-01-14 DIAGNOSIS — K449 Diaphragmatic hernia without obstruction or gangrene: Secondary | ICD-10-CM | POA: Diagnosis present

## 2020-01-14 DIAGNOSIS — K922 Gastrointestinal hemorrhage, unspecified: Secondary | ICD-10-CM | POA: Diagnosis present

## 2020-01-14 DIAGNOSIS — Z7984 Long term (current) use of oral hypoglycemic drugs: Secondary | ICD-10-CM | POA: Diagnosis not present

## 2020-01-14 DIAGNOSIS — I110 Hypertensive heart disease with heart failure: Secondary | ICD-10-CM | POA: Diagnosis present

## 2020-01-14 DIAGNOSIS — G2581 Restless legs syndrome: Secondary | ICD-10-CM | POA: Diagnosis present

## 2020-01-14 DIAGNOSIS — K5520 Angiodysplasia of colon without hemorrhage: Secondary | ICD-10-CM | POA: Diagnosis not present

## 2020-01-14 DIAGNOSIS — Z20822 Contact with and (suspected) exposure to covid-19: Secondary | ICD-10-CM | POA: Diagnosis present

## 2020-01-14 DIAGNOSIS — K2970 Gastritis, unspecified, without bleeding: Secondary | ICD-10-CM | POA: Diagnosis present

## 2020-01-14 DIAGNOSIS — D62 Acute posthemorrhagic anemia: Secondary | ICD-10-CM | POA: Diagnosis present

## 2020-01-14 DIAGNOSIS — K921 Melena: Secondary | ICD-10-CM | POA: Diagnosis present

## 2020-01-14 DIAGNOSIS — E119 Type 2 diabetes mellitus without complications: Secondary | ICD-10-CM | POA: Diagnosis present

## 2020-01-14 DIAGNOSIS — D500 Iron deficiency anemia secondary to blood loss (chronic): Secondary | ICD-10-CM | POA: Diagnosis not present

## 2020-01-14 LAB — CBC
HCT: 15 % — ABNORMAL LOW (ref 39.0–52.0)
Hemoglobin: 4.4 g/dL — CL (ref 13.0–17.0)
MCH: 29.5 pg (ref 26.0–34.0)
MCHC: 29.3 g/dL — ABNORMAL LOW (ref 30.0–36.0)
MCV: 100.7 fL — ABNORMAL HIGH (ref 80.0–100.0)
Platelets: 317 10*3/uL (ref 150–400)
RBC: 1.49 MIL/uL — ABNORMAL LOW (ref 4.22–5.81)
RDW: 23.7 % — ABNORMAL HIGH (ref 11.5–15.5)
WBC: 13.4 10*3/uL — ABNORMAL HIGH (ref 4.0–10.5)
nRBC: 1.7 % — ABNORMAL HIGH (ref 0.0–0.2)

## 2020-01-14 LAB — BASIC METABOLIC PANEL
Anion gap: 6 (ref 5–15)
BUN: 47 mg/dL — ABNORMAL HIGH (ref 8–23)
CO2: 22 mmol/L (ref 22–32)
Calcium: 8 mg/dL — ABNORMAL LOW (ref 8.9–10.3)
Chloride: 105 mmol/L (ref 98–111)
Creatinine, Ser: 0.85 mg/dL (ref 0.61–1.24)
GFR, Estimated: >60 mL/min (ref >60)
Glucose, Bld: 126 mg/dL — ABNORMAL HIGH (ref 70–99)
Potassium: 3.6 mmol/L (ref 3.5–5.1)
Sodium: 133 mmol/L — ABNORMAL LOW (ref 135–145)

## 2020-01-14 LAB — POC OCCULT BLOOD, ED: Fecal Occult Bld: POSITIVE — AB

## 2020-01-14 LAB — RESP PANEL BY RT-PCR (FLU A&B, COVID) ARPGX2
Influenza A by PCR: NEGATIVE
Influenza B by PCR: NEGATIVE
SARS Coronavirus 2 by RT PCR: NEGATIVE

## 2020-01-14 LAB — PREPARE RBC (CROSSMATCH)

## 2020-01-14 LAB — PROTIME-INR
INR: 0.9 (ref 0.8–1.2)
Prothrombin Time: 11.8 seconds (ref 11.4–15.2)

## 2020-01-14 MED ORDER — BUDESONIDE 0.5 MG/2ML IN SUSP
2.0000 mL | Freq: Two times a day (BID) | RESPIRATORY_TRACT | Status: DC
  Administered 2020-01-14 – 2020-01-22 (×14): 0.5 mg via RESPIRATORY_TRACT
  Filled 2020-01-14 (×15): qty 2

## 2020-01-14 MED ORDER — MONTELUKAST SODIUM 10 MG PO TABS: 10.0000 mg | ORAL_TABLET | Freq: Every evening | ORAL | Status: DC | PRN

## 2020-01-14 MED ORDER — PREDNISONE 5 MG PO TABS
5.0000 mg | ORAL_TABLET | Freq: Every day | ORAL | Status: DC
  Administered 2020-01-14 – 2020-01-22 (×7): 5 mg via ORAL
  Filled 2020-01-14 (×8): qty 1

## 2020-01-14 MED ORDER — DIPHENHYDRAMINE HCL 25 MG PO CAPS
25.0000 mg | ORAL_CAPSULE | Freq: Four times a day (QID) | ORAL | Status: AC | PRN
  Administered 2020-01-14: 25 mg via ORAL
  Filled 2020-01-14: qty 1

## 2020-01-14 MED ORDER — FUROSEMIDE 10 MG/ML IJ SOLN
40.0000 mg | Freq: Once | INTRAMUSCULAR | Status: AC
  Administered 2020-01-14: 40 mg via INTRAVENOUS
  Filled 2020-01-14: qty 4

## 2020-01-14 MED ORDER — SIMVASTATIN 40 MG PO TABS
40.0000 mg | ORAL_TABLET | Freq: Every day | ORAL | Status: DC
  Administered 2020-01-14 – 2020-01-21 (×8): 40 mg via ORAL
  Filled 2020-01-14 (×8): qty 1

## 2020-01-14 MED ORDER — FLUTICASONE PROPIONATE 50 MCG/ACT NA SUSP
1.0000 | Freq: Every day | NASAL | Status: DC | PRN
  Filled 2020-01-14: qty 16

## 2020-01-14 MED ORDER — ACETAMINOPHEN 500 MG PO TABS
500.0000 mg | ORAL_TABLET | Freq: Every evening | ORAL | Status: AC | PRN
  Administered 2020-01-14: 500 mg via ORAL
  Filled 2020-01-14: qty 1

## 2020-01-14 MED ORDER — SODIUM CHLORIDE 0.9 % IV SOLN
10.0000 mL/h | Freq: Once | INTRAVENOUS | Status: AC
  Administered 2020-01-14: 10 mL/h via INTRAVENOUS

## 2020-01-14 MED ORDER — SODIUM CHLORIDE 0.9 % IV BOLUS
1000.0000 mL | Freq: Once | INTRAVENOUS | Status: AC
  Administered 2020-01-14: 1000 mL via INTRAVENOUS

## 2020-01-14 MED ORDER — ROPINIROLE HCL 1 MG PO TABS
3.0000 mg | ORAL_TABLET | Freq: Every day | ORAL | Status: DC
  Administered 2020-01-14 – 2020-01-21 (×8): 3 mg via ORAL
  Filled 2020-01-14 (×8): qty 3

## 2020-01-14 MED ORDER — ALBUTEROL SULFATE HFA 108 (90 BASE) MCG/ACT IN AERS: 2.0000 | INHALATION_SPRAY | Freq: Four times a day (QID) | RESPIRATORY_TRACT | Status: DC | PRN

## 2020-01-14 NOTE — ED Provider Notes (Signed)
[place] DEPT Provider Note   CSN: [id number] Arrival date & time: 01/14/20  1256     History Chief Complaint  Patient presents with  . Weakness    LOMAX POEHLER is a 86 y.o. male with a history of stroke on Plavix, diverticulosis, GI bleeds, presenting to emergency department with dark stools and weakness since Friday.  The patient was discharged in the hospital 6 days ago on November 22 after admission for suspected lower GI bleed.  He was noted to have had an extensive outpatient work with time including nuclear scan, EGD and colonoscopy prior to that.  He had no further bleeding while in the hospital.  He received a dose of IV iron.  He was cleared for discharge by gastroenterology with outpatient follow-up in 7 to 10 days.  His hemoglobin is 9.3 at the time of discharge.  He was instructed to hold Plavix for 2 more days and then resume it, which he has.  He was also started on p.o. iron.  He tells me that he continues having dark stools for the past 2 days.  He reports feeling generally weak, short of breath, lightheaded.  He is concerned his blood count may be low.  Follows with [facility]  HPI     Past Medical History:  Diagnosis Date  . Acute CVA (cerebrovascular accident) ([place]) 07/25/2015  . Asthma   . Benign essential HTN   . Cerebral infarction due to embolism of left middle cerebral artery ([place]) 02/03/2014  . Chronic combined systolic and diastolic CHF (congestive heart failure) ([place]) 12/23/2017  . Colitis   . Colon polyps   . Diabetes mellitus type 2, diet-controlled ([place]) 12/22/2013  . Diverticulosis   . GERD (gastroesophageal reflux disease)   . GI bleed   . Hemorrhoids   . Hiatal hernia   . History of esophageal strciture   . HLD (hyperlipidemia)   . Osteoarthritis   . Proctitis   . Status post dilation of esophageal narrowing   . Stroke [facility])     Patient Active Problem List   Diagnosis Date Noted  . Acute GI bleeding  01/06/2020  . Protein-calorie malnutrition, severe ([place]) 04/01/2019  . DNR (do not resuscitate) 04/01/2019  . Rectal bleed 08/21/2018  . Platelet inhibition due to Plavix   . Rectal bleeding   . Diverticulosis large intestine w/o perforation or abscess w/bleeding   . GERD (gastroesophageal reflux disease) 04/21/2018  . Hematochezia   . Hypokalemia 12/25/2017  . Diverticulosis   . Acute blood loss anemia 12/24/2017  . Syncope due to orthostatic hypotension 12/23/2017  . Chronic combined systolic and diastolic CHF (congestive heart failure) ([place]) 12/23/2017  . Postural dizziness with presyncope 10/25/2016  . GI bleed 10/25/2016  . BPPV (benign paroxysmal positional vertigo) 10/25/2016  . Lower GI bleed 10/24/2016  . Syncope 10/24/2016  . TIA (transient ischemic attack) 07/22/2016  . Benign essential HTN   . PAF (paroxysmal atrial fibrillation) ([place])   . Stroke-like symptom 02/22/2016  . Leukocytosis 02/22/2016  . Acute lower GI bleeding 02/01/2016  . History of lower GI bleeding Dec 2017 02/01/2016  . Chronic anticoagulation 2/2 history of CVA 01/31/2016  . History of CVA (cerebrovascular accident) 01/31/2016  . Sinusitis, chronic 12/12/2015  . Cough variant asthma 10/31/2015  . Insomnia 07/25/2015  . HLD (hyperlipidemia) 02/03/2014  . Ulnar neuropathy at elbow of right upper extremity 02/03/2014  . Cervical radiculopathy 02/03/2014  . Seizures ([place])   . Diabetes mellitus type  II, non insulin dependent ([place]) 12/22/2013  . Recurrent ventral hernia 12/15/2011  . History of esophageal strciture   . Diverticulosis of colon with hemorrhage 06/24/2007    Past Surgical History:  Procedure Laterality Date  . ABDOMINAL HERNIA REPAIR    . BIOPSY  04/05/2019   Procedure: BIOPSY;  Surgeon: Thornton Doerr, MD;  Location: [place];  Service: Gastroenterology;;  . COLONOSCOPY     multiple  . COLONOSCOPY WITH PROPOFOL N/A 12/12/2016   Procedure: COLONOSCOPY WITH PROPOFOL;   Surgeon: Gatha Mayer, MD;  Location[facility] ENDOSCOPY;  Service: Endoscopy;  Laterality: N/A;  . COLONOSCOPY WITH PROPOFOL N/A 04/04/2019   Procedure: COLONOSCOPY WITH PROPOFOL;  Surgeon: Milus Banister, MD;  Location: [facility] ENDOSCOPY;  Service: Endoscopy;  Laterality: N/A;  . ESOPHAGOGASTRODUODENOSCOPY     multiple  . ESOPHAGOGASTRODUODENOSCOPY (EGD) WITH PROPOFOL N/A 04/05/2019   Procedure: ESOPHAGOGASTRODUODENOSCOPY (EGD) WITH PROPOFOL;  Surgeon: Thornton Starkes, MD;  Location: [place];  Service: Gastroenterology;  Laterality: N/A;  . INSERTION OF MESH  01/29/2012   Procedure: INSERTION OF MESH;  Surgeon: Gwenyth Ober, MD;  Location: [place];  Service: General;  Laterality: N/A;  . IR ANGIOGRAM SELECTIVE EACH ADDITIONAL VESSEL  04/01/2019  . IR ANGIOGRAM VISCERAL SELECTIVE  04/01/2019  . IR ANGIOGRAM VISCERAL SELECTIVE  04/01/2019  . IR ANGIOGRAM VISCERAL SELECTIVE  04/01/2019  . IR US GUIDE VASC ACCESS RIGHT  04/01/2019  . NISSEN FUNDOPLICATION    . SPLENECTOMY, TOTAL     nontraumatic rupture  . VENTRAL HERNIA REPAIR  01/29/2012    WITH MESH  . VENTRAL HERNIA REPAIR  01/29/2012   Procedure: HERNIA REPAIR VENTRAL ADULT;  Surgeon: Gwenyth Ober, MD;  Location: [place];  Service: General;  Laterality: N/A;  open recurrent ventral hernia repair with mesh       Family History  Problem Relation Age of Onset  . Alzheimer's disease Mother   . Breast cancer Mother   . Throat cancer Father   . Colon cancer Neg Hx   . Colon polyps Neg Hx   . Pancreatic cancer Neg Hx   . Stomach cancer Neg Hx   . Liver disease Neg Hx     Social History   Tobacco Use  . Smoking status: Never Smoker  . Smokeless tobacco: Never Used  Substance Use Topics  . Alcohol use: Yes    Alcohol/week: 2.0 standard drinks    Types: 2 Glasses of wine per week    Comment: daily  . Drug use: No    Home Medications Prior to Admission medications   Medication Sig Start Date End Date Taking? Authorizing Provider   albuterol (PROAIR HFA) 108 (90 Base) MCG/ACT inhaler Inhale 2 puffs into the lungs every 6 (six) hours as needed for wheezing or shortness of breath.   Yes [provider]  albuterol (PROVENTIL) (2.5 MG/3ML) 0.083% nebulizer solution Take 3 mLs by nebulization every 6 (six) hours as needed for wheezing or shortness of breath.  10/04/18  Yes [provider]  budesonide (PULMICORT) 0.5 MG/2ML nebulizer solution Take 2 mLs by nebulization 2 (two) times daily. 09/01/18  Yes [provider]  clopidogrel (PLAVIX) 75 MG tablet Take 1 tablet (75 mg total) by mouth daily. Start only after a week 01/10/20  Yes Gonfa, Taye T, MD  diphenhydramine-acetaminophen (TYLENOL PM) 25-500 MG TABS tablet Take 1 tablet by mouth at bedtime.    Yes [provider]  ferrous sulfate 325 (65 FE) MG tablet Take 1  tablet (325 mg total) by mouth daily. 05/10/19  Yes Milus Banister, MD  fluticasone [facility]) 50 MCG/ACT nasal spray Place 1 spray into both nostrils daily as needed for allergies or rhinitis.  06/18/14  Yes [provider]  Melatonin 10 MG TABS Take 10 mg by mouth at bedtime as needed (for sleep).    Yes [provider]  metFORMIN (GLUCOPHAGE) 500 MG tablet Take 500 mg by mouth daily. 08/23/19  Yes [provider]  montelukast (SINGULAIR) 10 MG tablet Take 10 mg by mouth at bedtime as needed (for breathing flares).    Yes [provider]  polyethylene glycol (MIRALAX / GLYCOLAX) packet Take 17 g by mouth daily as needed for mild constipation ([place]).    Yes [provider]  predniSONE (DELTASONE) 10 MG tablet Take 5 mg by mouth daily.  07/11/19  Yes [provider]  psyllium (METAMUCIL) 58.6 % powder Take 1 packet by mouth daily as needed (for constipation- MIX AND DRINK).    Yes [provider]  rOPINIRole (REQUIP) 3 MG tablet Take 1 tablet (3 mg total) by mouth at bedtime. 10/26/19  Yes McCue, Janett Billow, NP  simvastatin  (ZOCOR) 40 MG tablet Take 1 tablet (40 mg total) by mouth daily. Patient taking differently: Take 40 mg by mouth at bedtime.  02/26/16  Yes Amin, Jeanella Flattery, MD  traMADol (ULTRAM) 50 MG tablet Take 50 mg by mouth daily as needed for moderate pain.  09/19/19  Yes [provider]    Allergies    Tape and Azithromycin  Review of Systems   Review of Systems  Constitutional: Positive for fatigue. Negative for chills and fever.  HENT: Negative for ear pain and sore throat.   Eyes: Negative for pain and visual disturbance.  Respiratory: Positive for shortness of breath. Negative for cough.   Cardiovascular: Negative for chest pain and palpitations.  Gastrointestinal: Positive for blood in stool. Negative for abdominal pain.  Genitourinary: Negative for dysuria and hematuria.  Musculoskeletal: Negative for arthralgias and back pain.  Skin: Negative for color change and rash.  Neurological: Positive for light-headedness. Negative for syncope.  Psychiatric/Behavioral: Negative for agitation and confusion.  All other systems reviewed and are negative.   Physical Exam Updated Vital Signs BP 117/60 (BP Location: Left Arm)   Pulse 88   Temp 98.2 F (36.8 C) (Oral)   Resp 16   SpO2 97%   Physical Exam Vitals and nursing note reviewed.  Constitutional:      Appearance: He is well-developed.  HENT:     Head: Normocephalic and atraumatic.  Eyes:     Comments: Conjunctival pallor  Cardiovascular:     Rate and Rhythm: Normal rate and regular rhythm.     Pulses: Normal pulses.  Pulmonary:     Effort: Pulmonary effort is normal. No respiratory distress.     Breath sounds: Normal breath sounds.  Abdominal:     Palpations: Abdomen is soft.     Tenderness: There is no abdominal tenderness.  Genitourinary:    Comments: Rectal exam positive for melena Musculoskeletal:     Cervical back: Neck supple.  Skin:    General: Skin is warm and dry.  Neurological:     Mental Status: He is  alert.     ED Results / Procedures / Treatments   Labs (all labs ordered are listed, but only abnormal results are displayed) Labs Reviewed  BASIC METABOLIC PANEL - Abnormal; Notable for the following components:  Result Value   Sodium 133 (*)    Glucose, Bld 126 (*)    BUN 47 (*)    Calcium 8.0 (*)    All other components within normal limits  CBC - Abnormal; Notable for the following components:   WBC 13.4 (*)    RBC 1.49 (*)    Hemoglobin 4.4 (*)    HCT 15.0 (*)    MCV 100.7 (*)    MCHC 29.3 (*)    RDW 23.7 (*)    nRBC 1.7 (*)    All other components within normal limits  BASIC METABOLIC PANEL - Abnormal; Notable for the following components:   Potassium 3.3 (*)    Glucose, Bld 131 (*)    BUN 36 (*)    Calcium 7.4 (*)    All other components within normal limits  CBC - Abnormal; Notable for the following components:   RBC 2.07 (*)    Hemoglobin 5.9 (*)    HCT 18.7 (*)    RDW 22.0 (*)    nRBC 1.5 (*)    All other components within normal limits  HEMOGLOBIN AND HEMATOCRIT, BLOOD - Abnormal; Notable for the following components:   Hemoglobin 6.0 (*)    HCT 19.2 (*)    All other components within normal limits  POC OCCULT BLOOD, ED - Abnormal; Notable for the following components:   Fecal Occult Bld POSITIVE (*)    All other components within normal limits  RESP PANEL BY RT-PCR (FLU A&B, COVID) ARPGX2  PROTIME-INR  TYPE AND SCREEN  PREPARE RBC (CROSSMATCH)  PREPARE RBC (CROSSMATCH)    EKG EKG Interpretation  Date/Time:  Sunday January 14 2020 13:40:04 EST Ventricular Rate:  104 PR Interval:    QRS Duration: 90 QT Interval:  325 QTC Calculation: 428 R Axis:   -4 Text Interpretation: Sinus tachycardia No STEMI Confirmed by Octaviano Glow [phone]) on 01/14/2020 1:54:09 PM   Radiology No results found.  Procedures .Critical Care Performed by: Wyvonnia Dusky, MD Authorized by: Wyvonnia Dusky, MD   Critical care provider statement:     Critical care time (minutes):  45   Critical care was necessary to treat or prevent imminent or life-threatening deterioration of the following conditions:  Circulatory failure   Critical care was time spent personally by me on the following activities:  Discussions with consultants, evaluation of patient's response to treatment, examination of patient, ordering and performing treatments and interventions, ordering and review of laboratory studies, ordering and review of radiographic studies, pulse oximetry, re-evaluation of patient's condition, obtaining history from patient or surrogate and review of old charts Comments:     Symptomatic anemia requiring blood transfusion   (including critical care time)  Medications Ordered in ED Medications  simvastatin (ZOCOR) tablet 40 mg (40 mg Oral Given 01/14/20 2122)  albuterol (VENTOLIN HFA) 108 (90 Base) MCG/ACT inhaler 2 puff (has no administration in time range)  budesonide (PULMICORT) nebulizer solution 0.5 mg (0.5 mg Nebulization Given 01/15/20 1001)  predniSONE (DELTASONE) tablet 5 mg (5 mg Oral Given 01/15/20 1134)  fluticasone (FLONASE) 50 MCG/ACT nasal spray 1 spray (has no administration in time range)  montelukast (SINGULAIR) tablet 10 mg (has no administration in time range)  rOPINIRole (REQUIP) tablet 3 mg (3 mg Oral Given 01/14/20 2338)  0.9 %  sodium chloride infusion (Manually program via Guardrails IV Fluids) (has no administration in time range)  sodium chloride 0.9 % bolus 1,000 mL (0 mLs Intravenous Stopped 01/14/20 1455)  0.9 %  sodium chloride infusion (10 mL/hr Intravenous New Bag/Given 01/14/20 1511)  furosemide (LASIX) injection 40 mg (40 mg Intravenous Given 01/14/20 2121)  acetaminophen (TYLENOL) tablet 500 mg (500 mg Oral Given 01/14/20 2338)    And  diphenhydrAMINE (BENADRYL) capsule 25 mg (25 mg Oral Given 01/14/20 2338)    ED Course  I have reviewed the triage vital signs and the nursing notes.  Pertinent labs &  imaging results that were available during my care of the patient were reviewed by me and considered in my medical decision making (see chart for details).  86 yo male presenting to ED with suspected GI bleed This is a recurring problem for him, unfortunately.  He is on blood thinners.  He is HD stable on initial exam.  I doubt this is an acute hemorrhage, and more likely acute on chronic GI bleed in the setting of A/C usage.  ECG personally reviewed with no acute ischemic findings  Prior medical records reviewed  Labs reviewed - BUN 47, Cr 0.85, Hgb 4.4, INR 0.9 Patient consented for pRBC - 2 units ordered Discussed with GI attending and added their team in consultation.  NPO at midnight. IV fluids ordered 1L NS on arrival.   Clinical Course as of Jan 14 1230  Sun Jan 14, 2020  1511 [facility] consulted and added to treatment team.  I advised I do feel he is HD stable at the moment - we are transfusing and admitting him to the hospitalist.  They will see patient as inpatient, advised NPO at midnight, per Dr Bryan Lemma.   [MT]  9 Dr Neysa Bonito hospitalist consulted for admission.  Patient updated regarding plan.   [MT]    Clinical Course User Index [MT] Lanecia Sliva, Carola Rhine, MD    Final Clinical Impression(s) / ED Diagnoses Final diagnoses:  Symptomatic anemia    Rx / DC Orders ED Discharge Orders    None       Gillie Fleites, Carola Rhine, MD 01/15/20 1231

## 2020-01-14 NOTE — ED Notes (Signed)
Report called to lance

## 2020-01-14 NOTE — ED Triage Notes (Signed)
[facility] , Pt from home, hx GI bleed, weaker than normal and black tarry stool since Friday.  120/36 HR 106  R 17 RA 97%  CBG 207  T 97.7

## 2020-01-14 NOTE — H&P (Signed)
History and Physical        Hospital Admission Note Date: 01/14/2020  Patient name: [name] Medical record number: [id number] Date of birth: August 23, 1931 Age: 86 y.o. Gender: male  PCP: Seward Carol, MD  Patient coming from: Independent living facility Lives with: Wife At baseline, ambulates: Psychiatric nurse Complaint  Patient presents with  . Weakness      HPI:   This is an 86 year old male with past medical history of CVA with residual left-sided weakness, prediabetes, hyperlipidemia, chronic steroid use for sinusitis, combined systolic and diastolic heart failure, diverticulosis, hemorrhoids, recurrent diverticular GI bleeding and extensive work-up in the past including bleeding scan, Meckel scan, EGD and colonoscopy, recent hospitalization from 11/20-11/22 for hematochezia who presents today for recurrent GI bleed and presyncopal episode.  States that since he was discharged on 11/22 he had persistent black stools but thought this was just residual blood in the colon.  Noted that his flatulence was more odorous recently.  Yesterday he felt much weaker and this a.m. could barely stand up and had a presyncopal episode and required help from his family.  Typically ambulates with a walker.  At his last hospitalization patient's Hb remained stable and he received IV iron and was cleared for discharge by GI with outpatient follow-up.  He held Plavix for 2 more days after hospitalization and restarted Plavix this past Wednesday and took Plavix for the following several days, last dose yesterday.  Denies any chest pain, nausea or vomiting, shortness of breath.  States he feels a bit better currently than he did this morning.  No other complaints.  Of note, patient takes chronic prednisone for the past 6 months or so for chronic sinusitis.   ED Course: tachycardic, BP  labile with intermittent hypotension, on room air. Notable Labs: Sodium 133, glucose 126, BUN 47, creatinine 0.85, WBC 13.4, Hb 4.4, platelets 317, INR 0.9, FOBT positive.  Patient received 1 L LR bolus and 2 units PRBCs have been ordered.  ED provider discussed with Dr. Bryan Lemma, [facility], who recommended n.p.o. after midnight.   Vitals:   01/14/20 1530 01/14/20 1600  BP: (!) 106/46 (!) 98/54  Pulse: (!) 103 99  Resp: 19 (!) 23  Temp:    SpO2: 100% 99%     Review of Systems:  Review of Systems  Gastrointestinal: Positive for melena.  All other systems reviewed and are negative.   Medical/Social/Family History   Past Medical History: Past Medical History:  Diagnosis Date  . Acute CVA (cerebrovascular accident) ([place]) 07/25/2015  . Asthma   . Benign essential HTN   . Cerebral infarction due to embolism of left middle cerebral artery ([place]) 02/03/2014  . Chronic combined systolic and diastolic CHF (congestive heart failure) ([place]) 12/23/2017  . Colitis   . Colon polyps   . Diabetes mellitus type 2, diet-controlled ([place]) 12/22/2013  . Diverticulosis   . GERD (gastroesophageal reflux disease)   . GI bleed   . Hemorrhoids   . Hiatal hernia   . History of esophageal strciture   . HLD (hyperlipidemia)   . Osteoarthritis   . Proctitis   . Status post dilation of esophageal narrowing   .  Stroke [facility])     Past Surgical History:  Procedure Laterality Date  . ABDOMINAL HERNIA REPAIR    . BIOPSY  04/05/2019   Procedure: BIOPSY;  Surgeon: Thornton Barsamian, MD;  Location: [place];  Service: Gastroenterology;;  . COLONOSCOPY     multiple  . COLONOSCOPY WITH PROPOFOL N/A 12/12/2016   Procedure: COLONOSCOPY WITH PROPOFOL;  Surgeon: Gatha Mayer, MD;  Location: [facility] ENDOSCOPY;  Service: Endoscopy;  Laterality: N/A;  . COLONOSCOPY WITH PROPOFOL N/A 04/04/2019   Procedure: COLONOSCOPY WITH PROPOFOL;  Surgeon: Milus Banister, MD;  Location[facility] ENDOSCOPY;  Service: Endoscopy;   Laterality: N/A;  . ESOPHAGOGASTRODUODENOSCOPY     multiple  . ESOPHAGOGASTRODUODENOSCOPY (EGD) WITH PROPOFOL N/A 04/05/2019   Procedure: ESOPHAGOGASTRODUODENOSCOPY (EGD) WITH PROPOFOL;  Surgeon: Thornton Hinton, MD;  Location: [place];  Service: Gastroenterology;  Laterality: N/A;  . INSERTION OF MESH  01/29/2012   Procedure: INSERTION OF MESH;  Surgeon: Gwenyth Ober, MD;  Location: [place];  Service: General;  Laterality: N/A;  . IR ANGIOGRAM SELECTIVE EACH ADDITIONAL VESSEL  04/01/2019  . IR ANGIOGRAM VISCERAL SELECTIVE  04/01/2019  . IR ANGIOGRAM VISCERAL SELECTIVE  04/01/2019  . IR ANGIOGRAM VISCERAL SELECTIVE  04/01/2019  . IR US GUIDE VASC ACCESS RIGHT  04/01/2019  . NISSEN FUNDOPLICATION    . SPLENECTOMY, TOTAL     nontraumatic rupture  . VENTRAL HERNIA REPAIR  01/29/2012    WITH MESH  . VENTRAL HERNIA REPAIR  01/29/2012   Procedure: HERNIA REPAIR VENTRAL ADULT;  Surgeon: Gwenyth Ober, MD;  Location: [place];  Service: General;  Laterality: N/A;  open recurrent ventral hernia repair with mesh    Medications: Prior to Admission medications   Medication Sig Start Date End Date Taking? Authorizing Provider  albuterol (PROAIR HFA) 108 (90 Base) MCG/ACT inhaler Inhale 2 puffs into the lungs every 6 (six) hours as needed for wheezing or shortness of breath.   Yes [provider]  albuterol (PROVENTIL) (2.5 MG/3ML) 0.083% nebulizer solution Take 3 mLs by nebulization every 6 (six) hours as needed for wheezing or shortness of breath.  10/04/18  Yes [provider]  budesonide (PULMICORT) 0.5 MG/2ML nebulizer solution Take 2 mLs by nebulization 2 (two) times daily. 09/01/18  Yes [provider]  clopidogrel (PLAVIX) 75 MG tablet Take 1 tablet (75 mg total) by mouth daily. Start only after a week 01/10/20  Yes Gonfa, Taye T, MD  diphenhydramine-acetaminophen (TYLENOL PM) 25-500 MG TABS tablet Take 1 tablet by mouth at bedtime.    Yes [provider]   ferrous sulfate 325 (65 FE) MG tablet Take 1 tablet (325 mg total) by mouth daily. 05/10/19  Yes Milus Banister, MD  fluticasone [facility]) 50 MCG/ACT nasal spray Place 1 spray into both nostrils daily as needed for allergies or rhinitis.  06/18/14  Yes [provider]  Melatonin 10 MG TABS Take 10 mg by mouth at bedtime as needed (for sleep).    Yes [provider]  metFORMIN (GLUCOPHAGE) 500 MG tablet Take 500 mg by mouth daily. 08/23/19  Yes [provider]  montelukast (SINGULAIR) 10 MG tablet Take 10 mg by mouth at bedtime as needed (for breathing flares).    Yes [provider]  polyethylene glycol (MIRALAX / GLYCOLAX) packet Take 17 g by mouth daily as needed for mild constipation ([place]).    Yes [provider]  predniSONE (DELTASONE) 10 MG tablet Take 5 mg by mouth daily.  07/11/19  Yes [provider]  psyllium (METAMUCIL) 58.6 % powder Take 1 packet by mouth daily as needed (for constipation- MIX AND DRINK).    Yes [provider]  rOPINIRole (REQUIP) 3 MG tablet Take 1 tablet (3 mg total) by mouth at bedtime. 10/26/19  Yes McCue, Janett Billow, NP  simvastatin (ZOCOR) 40 MG tablet Take 1 tablet (40 mg total) by mouth daily. Patient taking differently: Take 40 mg by mouth at bedtime.  02/26/16  Yes Amin, Jeanella Flattery, MD  traMADol (ULTRAM) 50 MG tablet Take 50 mg by mouth daily as needed for moderate pain.  09/19/19  Yes [provider]    Allergies:   Allergies  Allergen Reactions  . Tape Other (See Comments)    SKIN IS SENSITIVE; PLEASE USE PAPER TAPE; SKIN BRUISES AND TEARS EASILY!!  . Azithromycin Rash and Other (See Comments)    Pt had a Z-Pak Jan, 2020 - no reaction(??)    Social History:  reports that he has never smoked. He has never used smokeless tobacco. He reports current alcohol use of about 2.0 standard drinks of alcohol per week. He reports that he does not use drugs.  Family History: Family  History  Problem Relation Age of Onset  . Alzheimer's disease Mother   . Breast cancer Mother   . Throat cancer Father   . Colon cancer Neg Hx   . Colon polyps Neg Hx   . Pancreatic cancer Neg Hx   . Stomach cancer Neg Hx   . Liver disease Neg Hx      Objective   Physical Exam: Blood pressure (!) 98/54, pulse 99, temperature 98.7 F (37.1 C), temperature source Oral, resp. rate (!) 23, SpO2 99 %.  Physical Exam Vitals and nursing note reviewed.  Constitutional:      Appearance: Normal appearance.  HENT:     Head: Normocephalic and atraumatic.  Eyes:     Conjunctiva/sclera: Conjunctivae normal.  Cardiovascular:     Rate and Rhythm: Regular rhythm. Tachycardia present.  Pulmonary:     Effort: Pulmonary effort is normal.     Breath sounds: Normal breath sounds.  Abdominal:     General: Abdomen is flat.     Palpations: Abdomen is soft.  Musculoskeletal:        General: No swelling or tenderness.     Right lower leg: Edema present.     Left lower leg: Edema present.     Comments: Trace to 1+ lower extremity pitting edema  Skin:    Coloration: Skin is pale. Skin is not jaundiced.  Neurological:     Mental Status: He is alert. Mental status is at baseline.  Psychiatric:        Mood and Affect: Mood normal.        Behavior: Behavior normal.     LABS on Admission: I have personally reviewed all the labs and imaging below    Basic Metabolic Panel: Recent Labs  Lab 01/14/20 1355  NA 133*  K 3.6  CL 105  CO2 22  GLUCOSE 126*  BUN 47*  CREATININE 0.85  CALCIUM 8.0*   Liver Function Tests: No results for input(s): AST, ALT, ALKPHOS, BILITOT, PROT, ALBUMIN in the last 168 hours. No results for input(s): LIPASE, AMYLASE in the last 168 hours. No results for input(s): AMMONIA in the last 168 hours. CBC: Recent Labs  Lab 01/08/20 1104 01/08/20 1104 01/14/20 1355  WBC 9.3  --  13.4*  HGB 8.3*  --  4.4*  HCT 26.7*  --  15.0*  MCV 92.1   < > 100.7*  PLT 304   --  317   < > = values in this interval not displayed.   Cardiac Enzymes: No results for input(s): CKTOTAL, CKMB, CKMBINDEX, TROPONINI in the last 168 hours. BNP: Invalid input(s): POCBNP CBG: Recent Labs  Lab 01/08/20 0737 01/08/20 1121  GLUCAP 110* 123*    Radiological Exams on Admission:  No results found.    EKG: Independently reviewed   A & P   Principal Problem:   Acute blood loss anemia Active Problems:   History of CVA (cerebrovascular accident)   Postural dizziness with presyncope   Chronic combined systolic and diastolic CHF (congestive heart failure) ([place])   DNR (do not resuscitate)   Acute GI bleeding   1. Acute blood loss anemia secondary to recurrent GI bleed a. Afebrile, labile BPs but currently asymptomatic b. Hold Plavix c. 2 units PRBCs d. Follow-up H&H and trend after transfusion e. Lasix with blood transfusion if blood pressure is stable as he is becoming volume overloaded f. GI consulted g. N.p.o. after midnight  2. Presyncopal episode secondary to symptomatic anemia a. Hb 4.4, down from 8.3 on 11/22 b. Plan as above  3. Prediabetes a. Monitor while limited diet  4. Chronic steroid use for sinusitis a. Currently on prednisone 5 mg daily b. Recommend weaning off  5. Restless leg syndrome a. Holding home Requip  6. Insomnia a. Holding home meds  7. History of CVA with left-sided weakness a. Holding home Plavix b. Ambulates with a walker  8. Combined systolic and diastolic heart failure a. Becoming volume overloaded but not in overt heart failure exacerbation b. Lasix with blood transfusion if BP allows   DVT prophylaxis: SCDs   Code Status: Prior  Diet: Clear liquid diet, n.p.o. after midnight Family Communication: Admission, patients condition and plan of care including tests being ordered have been discussed with the patient who indicates understanding and agrees with the plan and Code Status.  Disposition Plan: The  appropriate patient status for this patient is INPATIENT. Inpatient status is judged to be reasonable and necessary in order to provide the required intensity of service to ensure the patient's safety. The patient's presenting symptoms, physical exam findings, and initial radiographic and laboratory data in the context of their chronic comorbidities is felt to place them at high risk for further clinical deterioration. Furthermore, it is not anticipated that the patient will be medically stable for discharge from the hospital within 2 midnights of admission. The following factors support the patient status of inpatient.   " The patient's presenting symptoms include GI bleed, presyncopal episode. " The worrisome physical exam findings include labile BPs, pallor. " The initial radiographic and laboratory data are worrisome because of anemia. " The chronic co-morbidities include recurrent diverticular bleeds, heart failure.   * I certify that at the point of admission it is my clinical judgment that the patient will require inpatient hospital care spanning beyond 2 midnights from the point of admission due to high intensity of service, high risk for further deterioration and high frequency of surveillance required.*   Status is: Inpatient  Remains inpatient appropriate because:Hemodynamically unstable, IV treatments appropriate due to intensity of illness or inability to take PO and Inpatient level of care appropriate due to severity of illness   Dispo: The patient is from: ALF              Anticipated d/c is to:  ALF              Anticipated d/c date is: 3 days              Patient currently is not medically stable to d/c.         The medical decision making on this patient was of high complexity and the patient is at high risk for clinical deterioration, therefore this is a level 3  admission.  Consultants  . GI  Procedures  . None  Time Spent on Admission: 71 minutes    Harold Hedge, DO [facility]  01/14/2020, 5:12 PM

## 2020-01-14 NOTE — ED Notes (Signed)
Date and time results received: 01/14/20 1445 (use smartphrase ".now" to insert current time)  Test: HgB Critical Value: 4.4  Name of Provider Notified: Trifan  Orders Received? Or Actions Taken?:

## 2020-01-15 ENCOUNTER — Encounter (HOSPITAL_COMMUNITY): Payer: Self-pay | Admitting: Internal Medicine

## 2020-01-15 ENCOUNTER — Other Ambulatory Visit: Payer: Self-pay

## 2020-01-15 DIAGNOSIS — I5042 Chronic combined systolic (congestive) and diastolic (congestive) heart failure: Secondary | ICD-10-CM | POA: Diagnosis not present

## 2020-01-15 DIAGNOSIS — R195 Other fecal abnormalities: Secondary | ICD-10-CM

## 2020-01-15 DIAGNOSIS — D500 Iron deficiency anemia secondary to blood loss (chronic): Secondary | ICD-10-CM

## 2020-01-15 DIAGNOSIS — K921 Melena: Secondary | ICD-10-CM

## 2020-01-15 DIAGNOSIS — Z66 Do not resuscitate: Secondary | ICD-10-CM | POA: Diagnosis not present

## 2020-01-15 DIAGNOSIS — K922 Gastrointestinal hemorrhage, unspecified: Secondary | ICD-10-CM | POA: Diagnosis not present

## 2020-01-15 DIAGNOSIS — D62 Acute posthemorrhagic anemia: Secondary | ICD-10-CM

## 2020-01-15 LAB — HEMOGLOBIN AND HEMATOCRIT, BLOOD
HCT: 19.2 % — ABNORMAL LOW (ref 39.0–52.0)
HCT: 26.1 % — ABNORMAL LOW (ref 39.0–52.0)
Hemoglobin: 6 g/dL — CL (ref 13.0–17.0)
Hemoglobin: 8.4 g/dL — ABNORMAL LOW (ref 13.0–17.0)

## 2020-01-15 LAB — BASIC METABOLIC PANEL
Anion gap: 7 (ref 5–15)
BUN: 36 mg/dL — ABNORMAL HIGH (ref 8–23)
CO2: 23 mmol/L (ref 22–32)
Calcium: 7.4 mg/dL — ABNORMAL LOW (ref 8.9–10.3)
Chloride: 108 mmol/L (ref 98–111)
Creatinine, Ser: 0.75 mg/dL (ref 0.61–1.24)
GFR, Estimated: >60 mL/min (ref >60)
Glucose, Bld: 131 mg/dL — ABNORMAL HIGH (ref 70–99)
Potassium: 3.3 mmol/L — ABNORMAL LOW (ref 3.5–5.1)
Sodium: 138 mmol/L (ref 135–145)

## 2020-01-15 LAB — CBC
HCT: 18.7 % — ABNORMAL LOW (ref 39.0–52.0)
Hemoglobin: 5.9 g/dL — CL (ref 13.0–17.0)
MCH: 28.5 pg (ref 26.0–34.0)
MCHC: 31.6 g/dL (ref 30.0–36.0)
MCV: 90.3 fL (ref 80.0–100.0)
Platelets: 234 10*3/uL (ref 150–400)
RBC: 2.07 MIL/uL — ABNORMAL LOW (ref 4.22–5.81)
RDW: 22 % — ABNORMAL HIGH (ref 11.5–15.5)
WBC: 10.5 10*3/uL (ref 4.0–10.5)
nRBC: 1.5 % — ABNORMAL HIGH (ref 0.0–0.2)

## 2020-01-15 LAB — PREPARE RBC (CROSSMATCH)

## 2020-01-15 MED ORDER — ACETAMINOPHEN 500 MG PO TABS: 500.0000 mg | ORAL_TABLET | Freq: Every evening | ORAL | Status: DC | PRN

## 2020-01-15 MED ORDER — SODIUM CHLORIDE 0.9% IV SOLUTION: Freq: Once | INTRAVENOUS | Status: AC

## 2020-01-15 MED ORDER — DIPHENHYDRAMINE HCL 25 MG PO CAPS
25.0000 mg | ORAL_CAPSULE | Freq: Every evening | ORAL | Status: DC | PRN
  Administered 2020-01-15: 25 mg via ORAL
  Filled 2020-01-15: qty 1

## 2020-01-15 MED ORDER — DIPHENHYDRAMINE HCL 25 MG PO CAPS: 25.0000 mg | ORAL_CAPSULE | Freq: Four times a day (QID) | ORAL | Status: DC | PRN

## 2020-01-15 MED ORDER — SODIUM CHLORIDE 0.9 % IV SOLN
510.0000 mg | Freq: Once | INTRAVENOUS | Status: AC
  Administered 2020-01-15: 510 mg via INTRAVENOUS
  Filled 2020-01-15: qty 510

## 2020-01-15 MED ORDER — ACETAMINOPHEN 500 MG PO TABS
500.0000 mg | ORAL_TABLET | Freq: Every evening | ORAL | Status: DC | PRN
  Administered 2020-01-15: 500 mg via ORAL
  Filled 2020-01-15: qty 1

## 2020-01-15 NOTE — H&P (View-Only) (Signed)
Referring Provider:  [facility]         Primary Care Physician:  Seward Carol, MD Primary Gastroenterologist:   Oretha Caprice, MD               We were asked to see this patient for:     GI bleed             ASSESSMENT / PLAN:   # 86 year old male with recurrent GI bleeding on Plavix for history of CVA.  Multiple previous admissions for same. He had one positive bleeding scan in February 2021 suggesting activity in right colon but arteriography was negative as was subsequent colonoscopy and EGD. Several other negative CT angiographies through the years.. Stool is generally dark iron but he presents with black stools at time of GI bleeding. Could be right proximal colon but still suspicious of upper source, especially given elevated BUN  --Hgb only 5.9. Patient says more blood has already been ordered --Will plan for small bowel enteroscopy. If negative, consider dropping video capsule study. He is only two days off Plavix so need more washout time.  --Patient tells me that the Hospitalist is planning on talking to Neurologist about discontinuing Plavix given recurrent bleeding --Staring daily IV PPI  # Chronic intermittent solid food dysphagia --Patient and Dr. Ardis Hughs previously discussed EGD with dilation but there was concern about holding Plavix. Patient asked that esophagus be dilated at time of EGD this admission unless it would complicate current GI bleed.      HPI:                                                                                                                             Chief Complaint: GI bleed  [name] is a 86 y.o. male with a pmh significant for, not necessarily limited to:  CVA on Plavix, chronic systolic heart failure, hyperlipidemia, asthma diverticulosis  Patient has been hospitalized several times for recurrent GI bleeding on Plavix.  In February 2021 he was admitted for a GI bleed requiring several units of blood. A bleeding scan suggested  active bleeding in the right colon however he had an unremarkable arteriography.  Colonoscopy that admission showed dark liquid stool throughout the colon raising concern for upper bleeding but no source found on EGD. Since then patient has been readmitted 2-3 three times for recurrent GI bleeding. In August he required several units of units of blood. CT angiogram was negative, endoscopic evaluation not pursued.  Patient was readmitted earlier this month with recurrent GI bleeding, required two units of blood. Received IV iron.  We saw him in consultation, no imaging or endoscopic evaluation performed.   He was discharged home on 11/22 with a hemoglobin of 8.3  [name] has been readmitted with weakness.  His stools are always dark on iron but turned black at the time of GI bleeding.  When he went home from the hospital a week or so  ago his stools are still black.  He had no increase in volume/number of black stools to suggest increased bleeding but he became progressively weak.  Patient presented to the ED yesterday, hemoglobin was 4.4.  He has received 2 units of blood, hemoglobin 5.9 this a.m.  Patient has not had his Plavix yesterday nor today.  Other than feeling weak he feels okay.  No abdominal pain, nausea nor vomiting  [name] mentions that he has chronic, intermittent solid food dysphagia.  He and Dr. Ardis Hughs has discussed this before but there was concern about holding Plavix for the procedure.   PREVIOUS ENDOSCOPIC EVALUATIONS / PERTINENT STUDIES    February 2021 colonoscopy for GI bleed  --Complete exam, adequate prep --Dark liquid stool throughout the colon --extensive left colon diverticulosis. -- His presentation has always seems to be from a lower GI source, presumed divertcular.. This admission is similar with normal BUN/Cr however after this examination I has some doubt about him having a colon source of bleeding. Nuc med bleeding scans have varied twice (left colon, right colon)  and the 2020 Nuc scan suggested "hyperemia in the LEFT mid abdomen either at small bowel or the distal descending colon". He has not had EGD in 10 years. I am going to order a Meckels scan today and if negative then will need to consider UGI workup.  February 2021 EGD for upper GI bleed --LA Grade A reflux esophagitis with no bleeding. - Benign-appearing esophageal stricture. - Moderate gastritis. Biopsied. - Medium-sized hiatal hernia without Cameron's erosions. - Normal examined duodenum. - The examination was otherwise normal.  Past Medical History:  Diagnosis Date  . Acute CVA (cerebrovascular accident) ([place]) 07/25/2015  . Asthma   . Benign essential HTN   . Cerebral infarction due to embolism of left middle cerebral artery ([place]) 02/03/2014  . Chronic combined systolic and diastolic CHF (congestive heart failure) ([place]) 12/23/2017  . Colitis   . Colon polyps   . Diabetes mellitus type 2, diet-controlled ([place]) 12/22/2013  . Diverticulosis   . GERD (gastroesophageal reflux disease)   . GI bleed   . Hemorrhoids   . Hiatal hernia   . History of esophageal strciture   . HLD (hyperlipidemia)   . Osteoarthritis   . Proctitis   . Status post dilation of esophageal narrowing   . Stroke [facility])     Past Surgical History:  Procedure Laterality Date  . ABDOMINAL HERNIA REPAIR    . BIOPSY  04/05/2019   Procedure: BIOPSY;  Surgeon: Thornton Dowdell, MD;  Location: [place];  Service: Gastroenterology;;  . COLONOSCOPY     multiple  . COLONOSCOPY WITH PROPOFOL N/A 12/12/2016   Procedure: COLONOSCOPY WITH PROPOFOL;  Surgeon: Gatha Mayer, MD;  Location: [facility] ENDOSCOPY;  Service: Endoscopy;  Laterality: N/A;  . COLONOSCOPY WITH PROPOFOL N/A 04/04/2019   Procedure: COLONOSCOPY WITH PROPOFOL;  Surgeon: Milus Banister, MD;  Location[facility] ENDOSCOPY;  Service: Endoscopy;  Laterality: N/A;  . ESOPHAGOGASTRODUODENOSCOPY     multiple  . ESOPHAGOGASTRODUODENOSCOPY (EGD) WITH PROPOFOL N/A  04/05/2019   Procedure: ESOPHAGOGASTRODUODENOSCOPY (EGD) WITH PROPOFOL;  Surgeon: Thornton Diloreto, MD;  Location: [place];  Service: Gastroenterology;  Laterality: N/A;  . INSERTION OF MESH  01/29/2012   Procedure: INSERTION OF MESH;  Surgeon: Gwenyth Ober, MD;  Location: [place];  Service: General;  Laterality: N/A;  . IR ANGIOGRAM SELECTIVE EACH ADDITIONAL VESSEL  04/01/2019  . IR ANGIOGRAM VISCERAL SELECTIVE  04/01/2019  . IR ANGIOGRAM VISCERAL SELECTIVE  04/01/2019  . IR ANGIOGRAM VISCERAL SELECTIVE  04/01/2019  . IR US GUIDE VASC ACCESS RIGHT  04/01/2019  . NISSEN FUNDOPLICATION    . SPLENECTOMY, TOTAL     nontraumatic rupture  . VENTRAL HERNIA REPAIR  01/29/2012    WITH MESH  . VENTRAL HERNIA REPAIR  01/29/2012   Procedure: HERNIA REPAIR VENTRAL ADULT;  Surgeon: Gwenyth Ober, MD;  Location: [place];  Service: General;  Laterality: N/A;  open recurrent ventral hernia repair with mesh    Prior to Admission medications   Medication Sig Start Date End Date Taking? Authorizing Provider  albuterol (PROAIR HFA) 108 (90 Base) MCG/ACT inhaler Inhale 2 puffs into the lungs every 6 (six) hours as needed for wheezing or shortness of breath.   Yes [provider]  albuterol (PROVENTIL) (2.5 MG/3ML) 0.083% nebulizer solution Take 3 mLs by nebulization every 6 (six) hours as needed for wheezing or shortness of breath.  10/04/18  Yes [provider]  budesonide (PULMICORT) 0.5 MG/2ML nebulizer solution Take 2 mLs by nebulization 2 (two) times daily. 09/01/18  Yes [provider]  clopidogrel (PLAVIX) 75 MG tablet Take 1 tablet (75 mg total) by mouth daily. Start only after a week 01/10/20  Yes Gonfa, Taye T, MD  diphenhydramine-acetaminophen (TYLENOL PM) 25-500 MG TABS tablet Take 1 tablet by mouth at bedtime.    Yes [provider]  ferrous sulfate 325 (65 FE) MG tablet Take 1 tablet (325 mg total) by mouth daily. 05/10/19  Yes Milus Banister, MD  fluticasone  [facility]) 50 MCG/ACT nasal spray Place 1 spray into both nostrils daily as needed for allergies or rhinitis.  06/18/14  Yes [provider]  Melatonin 10 MG TABS Take 10 mg by mouth at bedtime as needed (for sleep).    Yes [provider]  metFORMIN (GLUCOPHAGE) 500 MG tablet Take 500 mg by mouth daily. 08/23/19  Yes [provider]  montelukast (SINGULAIR) 10 MG tablet Take 10 mg by mouth at bedtime as needed (for breathing flares).    Yes [provider]  polyethylene glycol (MIRALAX / GLYCOLAX) packet Take 17 g by mouth daily as needed for mild constipation ([place]).    Yes [provider]  predniSONE (DELTASONE) 10 MG tablet Take 5 mg by mouth daily.  07/11/19  Yes [provider]  psyllium (METAMUCIL) 58.6 % powder Take 1 packet by mouth daily as needed (for constipation- MIX AND DRINK).    Yes [provider]  rOPINIRole (REQUIP) 3 MG tablet Take 1 tablet (3 mg total) by mouth at bedtime. 10/26/19  Yes McCue, Janett Billow, NP  simvastatin (ZOCOR) 40 MG tablet Take 1 tablet (40 mg total) by mouth daily. Patient taking differently: Take 40 mg by mouth at bedtime.  02/26/16  Yes Amin, Jeanella Flattery, MD  traMADol (ULTRAM) 50 MG tablet Take 50 mg by mouth daily as needed for moderate pain.  09/19/19  Yes [provider]    Current Facility-Administered Medications  Medication Dose Route Frequency Provider Last Rate Last Admin  . 0.9 %  sodium chloride infusion (Manually program via Guardrails IV Fluids)   Intravenous Once Caren Griffins, MD      . albuterol (VENTOLIN HFA) 108 (90 Base) MCG/ACT inhaler 2 puff  2 puff Inhalation Q6H PRN Harold Hedge, MD      . budesonide (PULMICORT) nebulizer solution 0.5 mg  2 mL Nebulization BID Harold Hedge, MD   0.5 mg at 01/15/20  1001  . fluticasone (FLONASE) 50 MCG/ACT nasal spray 1 spray  1 spray Each Nare Daily PRN Harold Hedge, MD      . montelukast (SINGULAIR) tablet 10 mg  10 mg Oral  QHS PRN Harold Hedge, MD      . predniSONE (DELTASONE) tablet 5 mg  5 mg Oral Daily Harold Hedge, MD   5 mg at 01/14/20 2122  . rOPINIRole (REQUIP) tablet 3 mg  3 mg Oral QHS Lang Snow, FNP   3 mg at 01/14/20 2338  . simvastatin (ZOCOR) tablet 40 mg  40 mg Oral QHS Harold Hedge, MD   40 mg at 01/14/20 2122    Allergies as of 01/14/2020 - Review Complete 01/14/2020  Allergen Reaction Noted  . Tape Other (See Comments) 02/04/2018  . Azithromycin Rash and Other (See Comments) 03/08/2015    Family History  Problem Relation Age of Onset  . Alzheimer's disease Mother   . Breast cancer Mother   . Throat cancer Father   . Colon cancer Neg Hx   . Colon polyps Neg Hx   . Pancreatic cancer Neg Hx   . Stomach cancer Neg Hx   . Liver disease Neg Hx     Social History   Socioeconomic History  . Marital status: Married    Spouse name: [name]  . Number of children: 2  . Years of education: Bachelor's  . Highest education level: Not on file  Occupational History  . Occupation: retired  Tobacco Use  . Smoking status: Never Smoker  . Smokeless tobacco: Never Used  Substance and Sexual Activity  . Alcohol use: Yes    Alcohol/week: 2.0 standard drinks    Types: 2 Glasses of wine per week    Comment: daily  . Drug use: No  . Sexual activity: Not on file  Other Topics Concern  . Not on file  Social History Narrative   Patient is married and has 2 children.   Patient is right handed.   Patient has Bachelor's degree.   Patient drinks 2 cups daily.   Social Determinants of Health   Financial Resource Strain:   . Difficulty of Paying Living Expenses: Not on file  Food Insecurity:   . Worried About Charity fundraiser in the Last Year: Not on file  . Ran Out of Food in the Last Year: Not on file  Transportation Needs:   . Lack of Transportation (Medical): Not on file  . Lack of Transportation (Non-Medical): Not on file  Physical Activity:   . Days of Exercise per Week:  Not on file  . Minutes of Exercise per Session: Not on file  Stress:   . Feeling of Stress : Not on file  Social Connections:   . Frequency of Communication with Friends and Family: Not on file  . Frequency of Social Gatherings with Friends and Family: Not on file  . Attends Religious Services: Not on file  . Active Member of Clubs or Organizations: Not on file  . Attends Archivist Meetings: Not on file  . Marital Status: Not on file  Intimate Partner Violence:   . Fear of Current or Ex-Partner: Not on file  . Emotionally Abused: Not on file  . Physically Abused: Not on file  . Sexually Abused: Not on file    Review of Systems: All systems reviewed and negative except where noted in HPI.    OBJECTIVE:    Physical Exam: Vital signs in  last 24 hours: Temp:  [97.8 F (36.6 C)-98.8 F (37.1 C)] 98.2 F (36.8 C) (11/29 0646) Pulse Rate:  [88-105] 88 (11/29 0646) Resp:  [16-23] 16 (11/29 0646) BP: (98-117)/(43-98) 117/60 (11/29 0646) SpO2:  [97 %-100 %] 97 % (11/29 1001) Last BM Date: 01/14/20 General:   Alert, well-developed,  male in NAD Psych:  Pleasant, cooperative. Normal mood and affect. Eyes:  Pupils equal, sclera clear, no icterus.   Conjunctiva pink. Ears:  Normal auditory acuity. Nose:  No deformity, discharge,  or lesions. Neck:  Supple; no masses Lungs:  Clear throughout to auscultation.   No wheezes, crackles, or rhonchi.  Heart:  Regular rate , irregular rhythm;  no lower extremity edema Abdomen:  Soft, non-distended, nontender, BS active, no palp mass   Rectal:  Deferred  Msk:  Symmetrical without gross deformities. . Neurologic:  Alert and  oriented x4;  grossly normal neurologically. Skin:  Intact without significant lesions or rashes.  There were no vitals filed for this visit.   Scheduled inpatient medications . sodium chloride   Intravenous Once  . budesonide  2 mL Nebulization BID  . predniSONE  5 mg Oral Daily  . rOPINIRole  3 mg  Oral QHS  . simvastatin  40 mg Oral QHS      Intake/Output from previous day: 11/28 0701 - 11/29 0700 In: 2195 [P.O.:600; Blood:695; IV Piggyback:900] Out: 1500 [Urine:1500] Intake/Output this shift: Total I/O In: 240 [P.O.:240] Out: 200 [Urine:200]   Lab Results: Recent Labs    01/14/20 1355 01/15/20 0431  WBC 13.4* 10.5  HGB 4.4* 5.9*  6.0*  HCT 15.0* 18.7*  19.2*  PLT 317 234   BMET Recent Labs    01/14/20 1355 01/15/20 0431  NA 133* 138  K 3.6 3.3*  CL 105 108  CO2 22 23  GLUCOSE 126* 131*  BUN 47* 36*  CREATININE 0.85 0.75  CALCIUM 8.0* 7.4*   LFT No results for input(s): PROT, ALBUMIN, AST, ALT, ALKPHOS, BILITOT, BILIDIR, IBILI in the last 72 hours. PT/INR Recent Labs    01/14/20 1355  LABPROT 11.8  INR 0.9   Hepatitis Panel No results for input(s): HEPBSAG, HCVAB, HEPAIGM, HEPBIGM in the last 72 hours.   . CBC Latest Ref Rng & Units 01/15/2020 01/15/2020 01/14/2020  WBC 4.0 - 10.5 K/uL 10.5 - 13.4(H)  Hemoglobin 13.0 - 17.0 g/dL 5.9(LL) 6.0(LL) 4.4(LL)  Hematocrit 39 - 52 % 18.7(L) 19.2(L) 15.0(L)  Platelets 150 - 400 K/uL 234 - 317    . CMP Latest Ref Rng & Units 01/15/2020 01/14/2020 01/05/2020  Glucose 70 - 99 mg/dL 131(H) 126(H) 135(H)  BUN 8 - 23 mg/dL 36(H) 47(H) 17  Creatinine 0.61 - 1.24 mg/dL 0.75 0.85 0.79  Sodium 135 - 145 mmol/L 138 133(L) 137  Potassium 3.5 - 5.1 mmol/L 3.3(L) 3.6 4.3  Chloride 98 - 111 mmol/L 108 105 102  CO2 22 - 32 mmol/L 23 22 26   Calcium 8.9 - 10.3 mg/dL 7.4(L) 8.0(L) 8.6(L)  Total Protein 6.5 - 8.1 g/dL - - 6.2(L)  Total Bilirubin 0.3 - 1.2 mg/dL - - 0.4  Alkaline Phos 38 - 126 U/L - - 62  AST 15 - 41 U/L - - 18  ALT 0 - 44 U/L - - 11   Studies/Results: No results found.  Principal Problem:   Acute blood loss anemia Active Problems:   History of CVA (cerebrovascular accident)   Postural dizziness with presyncope   Chronic combined systolic and diastolic CHF (congestive  heart failure)  ([place])   DNR (do not resuscitate)   Acute GI bleeding    Tye Savoy, NP-C @  01/15/2020, 11:03 AM

## 2020-01-15 NOTE — Progress Notes (Addendum)
MEDICATION RELATED CONSULT NOTE - FOLLOW UP   Pharmacy Consult for IV iron Indication: anemia  Plan: Feraheme 510 mg IV x 1 dose today to complete 2 dose course.  First dose given 01/08/2020 during previous admission  Pharmacy to sign off  Eudelia Bunch, Pharm.D 01/15/2020 1:39 PM

## 2020-01-15 NOTE — Progress Notes (Signed)
PROGRESS NOTE  [name] TKZ:[id number] DOB: [birth year] DOA: 01/14/2020 PCP: Seward Carol, MD   LOS: 1 day   Brief Narrative / Interim history: 86 year old male with history of CVA and residual left-sided weakness, prediabetes, hyperlipidemia, chronic steroid use for sinusitis, combined systolic and diastolic CHF, recurrent lower GI bleed comes to the hospital with black stools.  Patient is on chronic Plavix for his CVA.  Unfortunately he was hospitalized about 6 times in the last year with recurrent GI bleed.  He underwent extensive work-up in the past with EGD, colonoscopy, bleeding scan.  Hemoglobin on admission was in the 4 range, he was given idiopathic blood cells.  Subjective / 24h Interval events: He is doing well this morning, he feels stronger  Assessment & Plan: Principal Problem Acute blood loss anemia secondary to recurrent GI bleed -Received units of packed red blood cells, hemoglobin improved to 6, will transfuse to discharge for total of 4 units so far -GI consulted, appreciate input -Hold Plavix  Active Problems History of CVA -On chronic Plavix, history of Dr. Leonie Man with neurology in September 2021, at that time recommendations were to continue Plavix.  -His current hospitalization is the 6th one for GI bleed in the last year and second one in November.  Have called and discussed with Dr. Joellyn Rued what the GI work-up reveals but he suggests transitioning to aspirin if okay with GI.  Combined systolic and diastolic CHF -He is going to receive 4 units of red blood cells total, blood pressure is stable and will follow with Lasix -Most recent 2D echo was done 3 years ago showed an EF of 45%.  Chronic steroid use for sinusitis -Continue prednisone  Presyncopal episode secondary to symptomatic anemia  Scheduled Meds: . sodium chloride   Intravenous Once  . budesonide  2 mL Nebulization BID  . predniSONE  5 mg Oral Daily  . rOPINIRole  3 mg Oral QHS  .  simvastatin  40 mg Oral QHS   Continuous Infusions: PRN Meds:.albuterol, fluticasone, montelukast  Diet Orders (From admission, onward)    Start     Ordered   01/14/20 1727  Diet clear liquid Room service appropriate? Yes; Fluid consistency: Thin  Diet effective now       Question Answer Comment  Room service appropriate? Yes   Fluid consistency: Thin      01/14/20 1726          DVT prophylaxis: SCDs Start: 01/14/20 1727     Code Status: DNR  Family Communication: no family at bedside   Status is: Inpatient  Remains inpatient appropriate because:IV treatments appropriate due to intensity of illness or inability to take PO and Inpatient level of care appropriate due to severity of illness   Dispo: The patient is from: Home              Anticipated d/c is to: Home              Anticipated d/c date is: 2 days              Patient currently is not medically stable to d/c.  Consultants:  GI  Procedures:  None   Microbiology  None   Antimicrobials: None     Objective: Vitals:   01/14/20 2235 01/15/20 0117 01/15/20 0646 01/15/20 1001  BP: (!) 110/98 116/65 117/60   Pulse: 96 99 88   Resp: 19 20 16    Temp: 98.8 F (37.1 C) 98.6 F (37 C) 98.2 F (36.8  C)   TempSrc: Oral Oral Oral   SpO2: 98% 98% 97% 97%    Intake/Output Summary (Last 24 hours) at 01/15/2020 1110 Last data filed at 01/15/2020 0935 Gross per 24 hour  Intake 2435 ml  Output 1700 ml  Net 735 ml   There were no vitals filed for this visit.  Examination:  Constitutional: NAD Eyes: no scleral icterus ENMT: Mucous membranes are moist.  Neck: normal, supple Respiratory: clear to auscultation bilaterally, no wheezing, no crackles. Normal respiratory effort. No accessory muscle use.  Cardiovascular: Regular rate and rhythm, no murmurs / rubs / gallops.  Trace edema Abdomen: non distended, no tenderness. Bowel sounds positive.  Musculoskeletal: no clubbing / cyanosis.  Skin: no  rashes Neurologic: non focal    Data Reviewed: I have independently reviewed following labs and imaging studies   CBC: Recent Labs  Lab 01/14/20 1355 01/15/20 0431  WBC 13.4* 10.5  HGB 4.4* 5.9*  6.0*  HCT 15.0* 18.7*  19.2*  MCV 100.7* 90.3  PLT 317 628   Basic Metabolic Panel: Recent Labs  Lab 01/14/20 1355 01/15/20 0431  NA 133* 138  K 3.6 3.3*  CL 105 108  CO2 22 23  GLUCOSE 126* 131*  BUN 47* 36*  CREATININE 0.85 0.75  CALCIUM 8.0* 7.4*   Liver Function Tests: No results for input(s): AST, ALT, ALKPHOS, BILITOT, PROT, ALBUMIN in the last 168 hours. Coagulation Profile: Recent Labs  Lab 01/14/20 1355  INR 0.9   HbA1C: No results for input(s): HGBA1C in the last 72 hours. CBG: Recent Labs  Lab 01/08/20 1121  GLUCAP 123*    Recent Results (from the past 240 hour(s))  Resp Panel by RT-PCR (Flu A&B, Covid) Nasopharyngeal Swab     Status: None   Collection Time: 01/05/20 11:13 PM   Specimen: Nasopharyngeal Swab; Nasopharyngeal(NP) swabs in vial transport medium  Result Value Ref Range Status   SARS Coronavirus 2 by RT PCR NEGATIVE NEGATIVE Final    Comment: (NOTE) SARS-CoV-2 target nucleic acids are NOT DETECTED.  The SARS-CoV-2 RNA is generally detectable in upper respiratory specimens during the acute phase of infection. The lowest concentration of SARS-CoV-2 viral copies this assay can detect is 138 copies/mL. A negative result does not preclude SARS-Cov-2 infection and should not be used as the sole basis for treatment or other patient management decisions. A negative result may occur with  improper specimen collection/handling, submission of specimen other than nasopharyngeal swab, presence of viral mutation(s) within the areas targeted by this assay, and inadequate number of viral copies(<138 copies/mL). A negative result must be combined with clinical observations, patient history, and epidemiological information. The expected result is  Negative.  Fact Sheet for Patients:  EntrepreneurPulse.com.au  Fact Sheet for Healthcare Providers:  IncredibleEmployment.be  This test is no t yet approved or cleared by the [place] FDA and  has been authorized for detection and/or diagnosis of SARS-CoV-2 by FDA under an Emergency Use Authorization (EUA). This EUA will remain  in effect (meaning this test can be used) for the duration of the COVID-19 declaration under Section 564(b)(1) of the Act, 21 U.S.C.section 360bbb-3(b)(1), unless the authorization is terminated  or revoked sooner.       Influenza A by PCR NEGATIVE NEGATIVE Final   Influenza B by PCR NEGATIVE NEGATIVE Final    Comment: (NOTE) The Xpert Xpress SARS-CoV-2/FLU/RSV plus assay is intended as an aid in the diagnosis of influenza from Nasopharyngeal swab specimens and should not be used as a  sole basis for treatment. Nasal washings and aspirates are unacceptable for Xpert Xpress SARS-CoV-2/FLU/RSV testing.  Fact Sheet for Patients: EntrepreneurPulse.com.au  Fact Sheet for Healthcare Providers: IncredibleEmployment.be  This test is not yet approved or cleared by the [place] FDA and has been authorized for detection and/or diagnosis of SARS-CoV-2 by FDA under an Emergency Use Authorization (EUA). This EUA will remain in effect (meaning this test can be used) for the duration of the COVID-19 declaration under Section 564(b)(1) of the Act, 21 U.S.C. section 360bbb-3(b)(1), unless the authorization is terminated or revoked.  Performed at [facility], [place] [address] [place], [place] [address]   Resp Panel by RT-PCR (Flu A&B, Covid) Nasopharyngeal Swab     Status: None   Collection Time: 01/14/20  2:59 PM   Specimen: Nasopharyngeal Swab; Nasopharyngeal(NP) swabs in vial transport medium  Result Value Ref Range Status   SARS Coronavirus 2 by RT PCR NEGATIVE  NEGATIVE Final    Comment: (NOTE) SARS-CoV-2 target nucleic acids are NOT DETECTED.  The SARS-CoV-2 RNA is generally detectable in upper respiratory specimens during the acute phase of infection. The lowest concentration of SARS-CoV-2 viral copies this assay can detect is 138 copies/mL. A negative result does not preclude SARS-Cov-2 infection and should not be used as the sole basis for treatment or other patient management decisions. A negative result may occur with  improper specimen collection/handling, submission of specimen other than nasopharyngeal swab, presence of viral mutation(s) within the areas targeted by this assay, and inadequate number of viral copies(<138 copies/mL). A negative result must be combined with clinical observations, patient history, and epidemiological information. The expected result is Negative.  Fact Sheet for Patients:  EntrepreneurPulse.com.au  Fact Sheet for Healthcare Providers:  IncredibleEmployment.be  This test is no t yet approved or cleared by the [place] FDA and  has been authorized for detection and/or diagnosis of SARS-CoV-2 by FDA under an Emergency Use Authorization (EUA). This EUA will remain  in effect (meaning this test can be used) for the duration of the COVID-19 declaration under Section 564(b)(1) of the Act, 21 U.S.C.section 360bbb-3(b)(1), unless the authorization is terminated  or revoked sooner.       Influenza A by PCR NEGATIVE NEGATIVE Final   Influenza B by PCR NEGATIVE NEGATIVE Final    Comment: (NOTE) The Xpert Xpress SARS-CoV-2/FLU/RSV plus assay is intended as an aid in the diagnosis of influenza from Nasopharyngeal swab specimens and should not be used as a sole basis for treatment. Nasal washings and aspirates are unacceptable for Xpert Xpress SARS-CoV-2/FLU/RSV testing.  Fact Sheet for Patients: EntrepreneurPulse.com.au  Fact Sheet for Healthcare  Providers: IncredibleEmployment.be  This test is not yet approved or cleared by the [place] FDA and has been authorized for detection and/or diagnosis of SARS-CoV-2 by FDA under an Emergency Use Authorization (EUA). This EUA will remain in effect (meaning this test can be used) for the duration of the COVID-19 declaration under Section 564(b)(1) of the Act, 21 U.S.C. section 360bbb-3(b)(1), unless the authorization is terminated or revoked.  Performed at [facility], [place] [address], [place],  [address]      Radiology Studies: No results found.  Marzetta Board, MD, PhD [facility]  Between 7 am - 7 pm I am available, please contact me via Amion or Securechat  Between 7 pm - 7 am I am not available, please contact night coverage MD/APP via Amion

## 2020-01-15 NOTE — Consult Note (Signed)
Referring Provider:  [facility]         Primary Care Physician:  Seward Carol, MD Primary Gastroenterologist:   Oretha Caprice, MD               We were asked to see this patient for:     GI bleed             ASSESSMENT / PLAN:   # 86 year old male with recurrent GI bleeding on Plavix for history of CVA.  Multiple previous admissions for same. He had one positive bleeding scan in February 2021 suggesting activity in right colon but arteriography was negative as was subsequent colonoscopy and EGD. Several other negative CT angiographies through the years.. Stool is generally dark iron but he presents with black stools at time of GI bleeding. Could be right proximal colon but still suspicious of upper source, especially given elevated BUN  --Hgb only 5.9. Patient says more blood has already been ordered --Will plan for small bowel enteroscopy. If negative, consider dropping video capsule study. He is only two days off Plavix so need more washout time.  --Patient tells me that the Hospitalist is planning on talking to Neurologist about discontinuing Plavix given recurrent bleeding --Staring daily IV PPI  # Chronic intermittent solid food dysphagia --Patient and Dr. Ardis Hughs previously discussed EGD with dilation but there was concern about holding Plavix. Patient asked that esophagus be dilated at time of EGD this admission unless it would complicate current GI bleed.      HPI:                                                                                                                             Chief Complaint: GI bleed  [name] is a 86 y.o. male with a pmh significant for, not necessarily limited to:  CVA on Plavix, chronic systolic heart failure, hyperlipidemia, asthma diverticulosis  Patient has been hospitalized several times for recurrent GI bleeding on Plavix.  In February 2021 he was admitted for a GI bleed requiring several units of blood. A bleeding scan suggested  active bleeding in the right colon however he had an unremarkable arteriography.  Colonoscopy that admission showed dark liquid stool throughout the colon raising concern for upper bleeding but no source found on EGD. Since then patient has been readmitted 2-3 three times for recurrent GI bleeding. In August he required several units of units of blood. CT angiogram was negative, endoscopic evaluation not pursued.  Patient was readmitted earlier this month with recurrent GI bleeding, required two units of blood. Received IV iron.  We saw him in consultation, no imaging or endoscopic evaluation performed.   He was discharged home on 11/22 with a hemoglobin of 8.3  [name] has been readmitted with weakness.  His stools are always dark on iron but turned black at the time of GI bleeding.  When he went home from the hospital a week or so  ago his stools are still black.  He had no increase in volume/number of black stools to suggest increased bleeding but he became progressively weak.  Patient presented to the ED yesterday, hemoglobin was 4.4.  He has received 2 units of blood, hemoglobin 5.9 this a.m.  Patient has not had his Plavix yesterday nor today.  Other than feeling weak he feels okay.  No abdominal pain, nausea nor vomiting  [name] mentions that he has chronic, intermittent solid food dysphagia.  He and Dr. Ardis Hughs has discussed this before but there was concern about holding Plavix for the procedure.   PREVIOUS ENDOSCOPIC EVALUATIONS / PERTINENT STUDIES    February 2021 colonoscopy for GI bleed  --Complete exam, adequate prep --Dark liquid stool throughout the colon --extensive left colon diverticulosis. -- His presentation has always seems to be from a lower GI source, presumed divertcular.. This admission is similar with normal BUN/Cr however after this examination I has some doubt about him having a colon source of bleeding. Nuc med bleeding scans have varied twice (left colon, right colon)  and the 2020 Nuc scan suggested "hyperemia in the LEFT mid abdomen either at small bowel or the distal descending colon". He has not had EGD in 10 years. I am going to order a Meckels scan today and if negative then will need to consider UGI workup.  February 2021 EGD for upper GI bleed --LA Grade A reflux esophagitis with no bleeding. - Benign-appearing esophageal stricture. - Moderate gastritis. Biopsied. - Medium-sized hiatal hernia without Cameron's erosions. - Normal examined duodenum. - The examination was otherwise normal.  Past Medical History:  Diagnosis Date   Acute CVA (cerebrovascular accident) ([place]) 07/25/2015   Asthma    Benign essential HTN    Cerebral infarction due to embolism of left middle cerebral artery ([place]) 02/03/2014   Chronic combined systolic and diastolic CHF (congestive heart failure) ([place]) 12/23/2017   Colitis    Colon polyps    Diabetes mellitus type 2, diet-controlled ([place]) 12/22/2013   Diverticulosis    GERD (gastroesophageal reflux disease)    GI bleed    Hemorrhoids    Hiatal hernia    History of esophageal strciture    HLD (hyperlipidemia)    Osteoarthritis    Proctitis    Status post dilation of esophageal narrowing    Stroke [facility])     Past Surgical History:  Procedure Laterality Date   ABDOMINAL HERNIA REPAIR     BIOPSY  04/05/2019   Procedure: BIOPSY;  Surgeon: Thornton Chambers, MD;  Location: [place];  Service: Gastroenterology;;   COLONOSCOPY     multiple   COLONOSCOPY WITH PROPOFOL N/A 12/12/2016   Procedure: COLONOSCOPY WITH PROPOFOL;  Surgeon: Gatha Mayer, MD;  Location: [facility] ENDOSCOPY;  Service: Endoscopy;  Laterality: N/A;   COLONOSCOPY WITH PROPOFOL N/A 04/04/2019   Procedure: COLONOSCOPY WITH PROPOFOL;  Surgeon: Milus Banister, MD;  Location: [facility] ENDOSCOPY;  Service: Endoscopy;  Laterality: N/A;   ESOPHAGOGASTRODUODENOSCOPY     multiple   ESOPHAGOGASTRODUODENOSCOPY (EGD) WITH PROPOFOL N/A  04/05/2019   Procedure: ESOPHAGOGASTRODUODENOSCOPY (EGD) WITH PROPOFOL;  Surgeon: Thornton Bowlds, MD;  Location: [place];  Service: Gastroenterology;  Laterality: N/A;   INSERTION OF MESH  01/29/2012   Procedure: INSERTION OF MESH;  Surgeon: Gwenyth Ober, MD;  Location: [place];  Service: General;  Laterality: N/A;   IR ANGIOGRAM SELECTIVE EACH ADDITIONAL VESSEL  04/01/2019   IR ANGIOGRAM VISCERAL SELECTIVE  04/01/2019   IR ANGIOGRAM VISCERAL SELECTIVE  04/01/2019   IR ANGIOGRAM VISCERAL SELECTIVE  04/01/2019   IR US GUIDE VASC ACCESS RIGHT  5/42/7062   NISSEN FUNDOPLICATION     SPLENECTOMY, TOTAL     nontraumatic rupture   VENTRAL HERNIA REPAIR  01/29/2012    WITH MESH   VENTRAL HERNIA REPAIR  01/29/2012   Procedure: HERNIA REPAIR VENTRAL ADULT;  Surgeon: Gwenyth Ober, MD;  Location: [place];  Service: General;  Laterality: N/A;  open recurrent ventral hernia repair with mesh    Prior to Admission medications   Medication Sig Start Date End Date Taking? Authorizing Provider  albuterol (PROAIR HFA) 108 (90 Base) MCG/ACT inhaler Inhale 2 puffs into the lungs every 6 (six) hours as needed for wheezing or shortness of breath.   Yes [provider]  albuterol (PROVENTIL) (2.5 MG/3ML) 0.083% nebulizer solution Take 3 mLs by nebulization every 6 (six) hours as needed for wheezing or shortness of breath.  10/04/18  Yes [provider]  budesonide (PULMICORT) 0.5 MG/2ML nebulizer solution Take 2 mLs by nebulization 2 (two) times daily. 09/01/18  Yes [provider]  clopidogrel (PLAVIX) 75 MG tablet Take 1 tablet (75 mg total) by mouth daily. Start only after a week 01/10/20  Yes Gonfa, Taye T, MD  diphenhydramine-acetaminophen (TYLENOL PM) 25-500 MG TABS tablet Take 1 tablet by mouth at bedtime.    Yes [provider]  ferrous sulfate 325 (65 FE) MG tablet Take 1 tablet (325 mg total) by mouth daily. 05/10/19  Yes Milus Banister, MD  fluticasone  [facility]) 50 MCG/ACT nasal spray Place 1 spray into both nostrils daily as needed for allergies or rhinitis.  06/18/14  Yes [provider]  Melatonin 10 MG TABS Take 10 mg by mouth at bedtime as needed (for sleep).    Yes [provider]  metFORMIN (GLUCOPHAGE) 500 MG tablet Take 500 mg by mouth daily. 08/23/19  Yes [provider]  montelukast (SINGULAIR) 10 MG tablet Take 10 mg by mouth at bedtime as needed (for breathing flares).    Yes [provider]  polyethylene glycol (MIRALAX / GLYCOLAX) packet Take 17 g by mouth daily as needed for mild constipation ([place]).    Yes [provider]  predniSONE (DELTASONE) 10 MG tablet Take 5 mg by mouth daily.  07/11/19  Yes [provider]  psyllium (METAMUCIL) 58.6 % powder Take 1 packet by mouth daily as needed (for constipation- MIX AND DRINK).    Yes [provider]  rOPINIRole (REQUIP) 3 MG tablet Take 1 tablet (3 mg total) by mouth at bedtime. 10/26/19  Yes McCue, Janett Billow, NP  simvastatin (ZOCOR) 40 MG tablet Take 1 tablet (40 mg total) by mouth daily. Patient taking differently: Take 40 mg by mouth at bedtime.  02/26/16  Yes Amin, Jeanella Flattery, MD  traMADol (ULTRAM) 50 MG tablet Take 50 mg by mouth daily as needed for moderate pain.  09/19/19  Yes [provider]    Current Facility-Administered Medications  Medication Dose Route Frequency Provider Last Rate Last Admin   0.9 %  sodium chloride infusion (Manually program via Guardrails IV Fluids)   Intravenous Once Caren Griffins, MD       albuterol (VENTOLIN HFA) 108 (90 Base) MCG/ACT inhaler 2 puff  2 puff Inhalation Q6H PRN Harold Hedge, MD       budesonide (PULMICORT) nebulizer solution 0.5 mg  2 mL Nebulization BID Harold Hedge, MD   0.5 mg at 01/15/20  1001   fluticasone (FLONASE) 50 MCG/ACT nasal spray 1 spray  1 spray Each Nare Daily PRN Harold Hedge, MD       montelukast (SINGULAIR) tablet 10 mg  10 mg Oral  QHS PRN Harold Hedge, MD       predniSONE (DELTASONE) tablet 5 mg  5 mg Oral Daily Harold Hedge, MD   5 mg at 01/14/20 2122   rOPINIRole (REQUIP) tablet 3 mg  3 mg Oral QHS Lang Snow, FNP   3 mg at 01/14/20 2338   simvastatin (ZOCOR) tablet 40 mg  40 mg Oral QHS Harold Hedge, MD   40 mg at 01/14/20 2122    Allergies as of 01/14/2020 - Review Complete 01/14/2020  Allergen Reaction Noted   Tape Other (See Comments) 02/04/2018   Azithromycin Rash and Other (See Comments) 03/08/2015    Family History  Problem Relation Age of Onset   Alzheimer's disease Mother    Breast cancer Mother    Throat cancer Father    Colon cancer Neg Hx    Colon polyps Neg Hx    Pancreatic cancer Neg Hx    Stomach cancer Neg Hx    Liver disease Neg Hx     Social History   Socioeconomic History   Marital status: Married    Spouse name: [name]   Number of children: 2   Years of education: Bachelor's   Highest education level: Not on file  Occupational History   Occupation: retired  Tobacco Use   Smoking status: Never Smoker   Smokeless tobacco: Never Used  Substance and Sexual Activity   Alcohol use: Yes    Alcohol/week: 2.0 standard drinks    Types: 2 Glasses of wine per week    Comment: daily   Drug use: No   Sexual activity: Not on file  Other Topics Concern   Not on file  Social History Narrative   Patient is married and has 2 children.   Patient is right handed.   Patient has Bachelor's degree.   Patient drinks 2 cups daily.   Social Determinants of Health   Financial Resource Strain:    Difficulty of Paying Living Expenses: Not on file  Food Insecurity:    Worried About Charity fundraiser in the Last Year: Not on file   [employer] of Food in the Last Year: Not on file  Transportation Needs:    Lack of Transportation (Medical): Not on file   Lack of Transportation (Non-Medical): Not on file  Physical Activity:    Days of Exercise per Week:  Not on file   Minutes of Exercise per Session: Not on file  Stress:    Feeling of Stress : Not on file  Social Connections:    Frequency of Communication with Friends and Family: Not on file   Frequency of Social Gatherings with Friends and Family: Not on file   Attends Religious Services: Not on file   Active Member of Clubs or Organizations: Not on file   Attends Archivist Meetings: Not on file   Marital Status: Not on file  Intimate Partner Violence:    Fear of Current or Ex-Partner: Not on file   Emotionally Abused: Not on file   Physically Abused: Not on file   Sexually Abused: Not on file    Review of Systems: All systems reviewed and negative except where noted in HPI.    OBJECTIVE:    Physical Exam: Vital signs in  last 24 hours: Temp:  [97.8 F (36.6 C)-98.8 F (37.1 C)] 98.2 F (36.8 C) (11/29 0646) Pulse Rate:  [88-105] 88 (11/29 0646) Resp:  [16-23] 16 (11/29 0646) BP: (98-117)/(43-98) 117/60 (11/29 0646) SpO2:  [97 %-100 %] 97 % (11/29 1001) Last BM Date: 01/14/20 General:   Alert, well-developed,  male in NAD Psych:  Pleasant, cooperative. Normal mood and affect. Eyes:  Pupils equal, sclera clear, no icterus.   Conjunctiva pink. Ears:  Normal auditory acuity. Nose:  No deformity, discharge,  or lesions. Neck:  Supple; no masses Lungs:  Clear throughout to auscultation.   No wheezes, crackles, or rhonchi.  Heart:  Regular rate , irregular rhythm;  no lower extremity edema Abdomen:  Soft, non-distended, nontender, BS active, no palp mass   Rectal:  Deferred  Msk:  Symmetrical without gross deformities. . Neurologic:  Alert and  oriented x4;  grossly normal neurologically. Skin:  Intact without significant lesions or rashes.  There were no vitals filed for this visit.   Scheduled inpatient medications  sodium chloride   Intravenous Once   budesonide  2 mL Nebulization BID   predniSONE  5 mg Oral Daily   rOPINIRole  3 mg  Oral QHS   simvastatin  40 mg Oral QHS      Intake/Output from previous day: 11/28 0701 - 11/29 0700 In: 2195 [P.O.:600; Blood:695; IV Piggyback:900] Out: 1500 [Urine:1500] Intake/Output this shift: Total I/O In: 240 [P.O.:240] Out: 200 [Urine:200]   Lab Results: Recent Labs    01/14/20 1355 01/15/20 0431  WBC 13.4* 10.5  HGB 4.4* 5.9*   6.0*  HCT 15.0* 18.7*   19.2*  PLT 317 234   BMET Recent Labs    01/14/20 1355 01/15/20 0431  NA 133* 138  K 3.6 3.3*  CL 105 108  CO2 22 23  GLUCOSE 126* 131*  BUN 47* 36*  CREATININE 0.85 0.75  CALCIUM 8.0* 7.4*   LFT No results for input(s): PROT, ALBUMIN, AST, ALT, ALKPHOS, BILITOT, BILIDIR, IBILI in the last 72 hours. PT/INR Recent Labs    01/14/20 1355  LABPROT 11.8  INR 0.9   Hepatitis Panel No results for input(s): HEPBSAG, HCVAB, HEPAIGM, HEPBIGM in the last 72 hours.   . CBC Latest Ref Rng & Units 01/15/2020 01/15/2020 01/14/2020  WBC 4.0 - 10.5 K/uL 10.5 - 13.4(H)  Hemoglobin 13.0 - 17.0 g/dL 5.9(LL) 6.0(LL) 4.4(LL)  Hematocrit 39 - 52 % 18.7(L) 19.2(L) 15.0(L)  Platelets 150 - 400 K/uL 234 - 317    . CMP Latest Ref Rng & Units 01/15/2020 01/14/2020 01/05/2020  Glucose 70 - 99 mg/dL 131(H) 126(H) 135(H)  BUN 8 - 23 mg/dL 36(H) 47(H) 17  Creatinine 0.61 - 1.24 mg/dL 0.75 0.85 0.79  Sodium 135 - 145 mmol/L 138 133(L) 137  Potassium 3.5 - 5.1 mmol/L 3.3(L) 3.6 4.3  Chloride 98 - 111 mmol/L 108 105 102  CO2 22 - 32 mmol/L 23 22 26   Calcium 8.9 - 10.3 mg/dL 7.4(L) 8.0(L) 8.6(L)  Total Protein 6.5 - 8.1 g/dL - - 6.2(L)  Total Bilirubin 0.3 - 1.2 mg/dL - - 0.4  Alkaline Phos 38 - 126 U/L - - 62  AST 15 - 41 U/L - - 18  ALT 0 - 44 U/L - - 11   Studies/Results: No results found.  Principal Problem:   Acute blood loss anemia Active Problems:   History of CVA (cerebrovascular accident)   Postural dizziness with presyncope   Chronic combined systolic and diastolic  CHF (congestive heart failure)  ([place])   DNR (do not resuscitate)   Acute GI bleeding    Tye Savoy, NP-C @  01/15/2020, 11:03 AM

## 2020-01-16 ENCOUNTER — Encounter (HOSPITAL_COMMUNITY)
Admission: EM | Disposition: A | Discharge status: Skilled Nursing Facility | Payer: Self-pay | Source: Skilled Nursing Facility | Attending: Internal Medicine

## 2020-01-16 ENCOUNTER — Inpatient Hospital Stay (HOSPITAL_COMMUNITY): Payer: Medicare Other | Admitting: Anesthesiology

## 2020-01-16 ENCOUNTER — Encounter (HOSPITAL_COMMUNITY): Payer: Self-pay | Admitting: Internal Medicine

## 2020-01-16 DIAGNOSIS — D649 Anemia, unspecified: Secondary | ICD-10-CM

## 2020-01-16 DIAGNOSIS — I5042 Chronic combined systolic (congestive) and diastolic (congestive) heart failure: Secondary | ICD-10-CM | POA: Diagnosis not present

## 2020-01-16 DIAGNOSIS — D62 Acute posthemorrhagic anemia: Secondary | ICD-10-CM | POA: Diagnosis not present

## 2020-01-16 DIAGNOSIS — K222 Esophageal obstruction: Secondary | ICD-10-CM

## 2020-01-16 DIAGNOSIS — K2970 Gastritis, unspecified, without bleeding: Secondary | ICD-10-CM

## 2020-01-16 DIAGNOSIS — K922 Gastrointestinal hemorrhage, unspecified: Secondary | ICD-10-CM | POA: Diagnosis not present

## 2020-01-16 HISTORY — PX: GIVENS CAPSULE STUDY: SHX5432

## 2020-01-16 HISTORY — PX: SUBMUCOSAL TATTOO INJECTION: SHX6856

## 2020-01-16 HISTORY — PX: ENTEROSCOPY: SHX5533

## 2020-01-16 HISTORY — PX: SAVORY DILATION: SHX5439

## 2020-01-16 LAB — BPAM RBC
Blood Product Expiration Date: 202112252359
Blood Product Expiration Date: 202112282359
Blood Product Expiration Date: 202112292359
Blood Product Expiration Date: 202112302359
ISSUE DATE / TIME: 202111281755
ISSUE DATE / TIME: 202111282211
ISSUE DATE / TIME: 202111291227
ISSUE DATE / TIME: 202111291458
Unit Type and Rh: 5100
Unit Type and Rh: 5100
Unit Type and Rh: 5100
Unit Type and Rh: 5100

## 2020-01-16 LAB — BASIC METABOLIC PANEL
Anion gap: 5 (ref 5–15)
BUN: 22 mg/dL (ref 8–23)
CO2: 26 mmol/L (ref 22–32)
Calcium: 7.8 mg/dL — ABNORMAL LOW (ref 8.9–10.3)
Chloride: 108 mmol/L (ref 98–111)
Creatinine, Ser: 0.77 mg/dL (ref 0.61–1.24)
GFR, Estimated: >60 mL/min (ref >60)
Glucose, Bld: 102 mg/dL — ABNORMAL HIGH (ref 70–99)
Potassium: 3.5 mmol/L (ref 3.5–5.1)
Sodium: 139 mmol/L (ref 135–145)

## 2020-01-16 LAB — TYPE AND SCREEN
ABO/RH(D): O POS
Antibody Screen: NEGATIVE
Unit division: 0
Unit division: 0
Unit division: 0
Unit division: 0

## 2020-01-16 LAB — CBC
HCT: 25.9 % — ABNORMAL LOW (ref 39.0–52.0)
Hemoglobin: 8.2 g/dL — ABNORMAL LOW (ref 13.0–17.0)
MCH: 29.3 pg (ref 26.0–34.0)
MCHC: 31.7 g/dL (ref 30.0–36.0)
MCV: 92.5 fL (ref 80.0–100.0)
Platelets: 234 10*3/uL (ref 150–400)
RBC: 2.8 MIL/uL — ABNORMAL LOW (ref 4.22–5.81)
RDW: 20.9 % — ABNORMAL HIGH (ref 11.5–15.5)
WBC: 8.2 10*3/uL (ref 4.0–10.5)
nRBC: 2.1 % — ABNORMAL HIGH (ref 0.0–0.2)

## 2020-01-16 LAB — GLUCOSE, CAPILLARY: Glucose-Capillary: 114 mg/dL — ABNORMAL HIGH (ref 70–99)

## 2020-01-16 SURGERY — ENTEROSCOPY
Anesthesia: Monitor Anesthesia Care

## 2020-01-16 MED ORDER — PROPOFOL 500 MG/50ML IV EMUL
INTRAVENOUS | Status: DC | PRN
  Administered 2020-01-16: 100 ug/kg/min via INTRAVENOUS

## 2020-01-16 MED ORDER — PROPOFOL 500 MG/50ML IV EMUL
INTRAVENOUS | Status: AC
  Filled 2020-01-16: qty 200

## 2020-01-16 MED ORDER — EPHEDRINE SULFATE-NACL 50-0.9 MG/10ML-% IV SOSY
PREFILLED_SYRINGE | INTRAVENOUS | Status: DC | PRN
  Administered 2020-01-16: 5 mg via INTRAVENOUS

## 2020-01-16 MED ORDER — PROPOFOL 10 MG/ML IV BOLUS
INTRAVENOUS | Status: DC | PRN
  Administered 2020-01-16: 20 mg via INTRAVENOUS

## 2020-01-16 MED ORDER — GLUCAGON HCL RDNA (DIAGNOSTIC) 1 MG IJ SOLR
INTRAMUSCULAR | Status: AC
  Filled 2020-01-16: qty 1

## 2020-01-16 MED ORDER — PHENYLEPHRINE 40 MCG/ML (10ML) SYRINGE FOR IV PUSH (FOR BLOOD PRESSURE SUPPORT)
PREFILLED_SYRINGE | INTRAVENOUS | Status: DC | PRN
  Administered 2020-01-16: 80 ug via INTRAVENOUS

## 2020-01-16 MED ORDER — SPOT INK MARKER SYRINGE KIT
PACK | SUBMUCOSAL | Status: AC
  Filled 2020-01-16: qty 5

## 2020-01-16 MED ORDER — LACTATED RINGERS IV SOLN: INTRAVENOUS | Status: DC | PRN

## 2020-01-16 MED ORDER — SPOT INK MARKER SYRINGE KIT
PACK | SUBMUCOSAL | Status: DC | PRN
  Administered 2020-01-16: 1.5 mL via SUBMUCOSAL

## 2020-01-16 MED ORDER — ZOLPIDEM TARTRATE 5 MG PO TABS
5.0000 mg | ORAL_TABLET | Freq: Every evening | ORAL | Status: DC | PRN
  Administered 2020-01-16 – 2020-01-21 (×6): 5 mg via ORAL
  Filled 2020-01-16 (×6): qty 1

## 2020-01-16 MED ORDER — LIDOCAINE HCL (CARDIAC) PF 100 MG/5ML IV SOSY
PREFILLED_SYRINGE | INTRAVENOUS | Status: DC | PRN
  Administered 2020-01-16: 60 mg via INTRAVENOUS

## 2020-01-16 NOTE — Op Note (Signed)
[facility] Patient Name: [name] Procedure Date: 01/16/2020 MRN: [id number] Attending MD: Justice Britain , MD Date of Birth: February 04, 1932 CSN: [id number] Age: 86 Admit Type: Inpatient Procedure:                Small bowel enteroscopy Indications:              Anemia, Melena, Obscure gastrointestinal bleeding Providers:                Justice Britain, MD, Cleda Daub, RN, Benetta Spar, Technician, Ladona Ridgel, Technician Referring MD:             [facility] Medicines:                Monitored Anesthesia Care Complications:            No immediate complications. Estimated Blood Loss:     Estimated blood loss was minimal. Procedure:                Pre-Anesthesia Assessment:                           - Prior to the procedure, a History and Physical                            was performed, and patient medications and                            allergies were reviewed. The patient's tolerance of                            previous anesthesia was also reviewed. The risks                            and benefits of the procedure and the sedation                            options and risks were discussed with the patient.                            All questions were answered, and informed consent                            was obtained. Prior Anticoagulants: The patient has                            taken Plavix (clopidogrel), last dose was 3 days                            prior to procedure. ASA Grade Assessment: III - A                            patient with severe systemic disease. After  reviewing the risks and benefits, the patient was                            deemed in satisfactory condition to undergo the                            procedure.                           After obtaining informed consent, the endoscope was                            passed under direct vision. Throughout  the                            procedure, the patient's blood pressure, pulse, and                            oxygen saturations were monitored continuously. The                            PCF-H190DL ([id number]) Olympus pediatric colonscope                            was introduced through the mouth and advanced to                            the proximal jejunum. The GIF-H190 ([id number]) was                            introduced through the and advanced to the. The                            small bowel enteroscopy was accomplished without                            difficulty. The patient tolerated the procedure. Scope In: Scope Out: Findings:      Two benign-appearing, intrinsic moderate stenoses were found in the       proximal esophagus at the UES and at the distal UES. . The narrowest       stenosis measured 1.2 cm (inner diameter) x less than one cm (in       length). The stenoses were traversed with the pediatric colonoscope, but       this required gentle pressure and maneuvering. After the rest of the       enteroscopy was completed, an EGD scope was introduced into the patient       and a guidewire was placed and the scope was withdrawn. Dilation was       performed with a Savary dilator with mild resistance at 15 mm. The       dilation site was examined following endoscope reinsertion and showed       moderate mucosal disruption, moderate improvement in luminal narrowing       and no perforation in both regions.      No gross lesions were noted in  the entire esophagus. Biopsies were taken       with a cold forceps for histology to rule out EoE/LoE.      4 cm Hiatal hernia noted.      Segmental mild inflammation characterized by erosions and granularity       was found in the gastric body, at the incisura and in the gastric antrum.      No other gross lesions were noted in the entire examined stomach.      Normal mucosa was found in the entire duodenum.      Normal mucosa was  found in the jejunum. Distal extent of today's       procedure was tattooed with an injection of Spot (carbon black) for       marking purposes. Impression:               - Benign-appearing esophageal stenoses at UES.                            Dilated.                           - No gross lesions in esophagus. Biopsied.                           - 4 cm hiatal hernia noted.                           - Gastritis. No other gross lesions in the stomach.                            Not biopsied as just biopsied earlier this year.                           - Normal mucosa was found in the entire examined                            duodenum.                           - Normal mucosa was found in the jejunum. Tattooed. Recommendation:           - The patient will be observed post-procedure,                            until all discharge criteria are met.                           - Return patient to hospital ward for ongoing care.                           - We did not have the necessary accessories to                            proceed with VCE placement via endoscopy. However,  after dilation of esophagus, hope will be patient                            has ability to swallow VCE this afternoon.                           - Diet advancement as per VCE protocol (RN team                            will place into chart).                           - Continue to hold Plavix.                           - Repeat the EGD PRN for retreatment and redilation                            based on symptoms.                           - Await pathology results.                           - The findings and recommendations were discussed                            with the patient.                           - The findings and recommendations were discussed                            with the patient's family. Procedure Code(s):        --- Professional ---                            [phone], Esophagogastroduodenoscopy, flexible,                            transoral; with insertion of guide wire followed by                            passage of dilator(s) through esophagus over guide                            wire                           40981, Unlisted procedure, small intestine Diagnosis Code(s):        --- Professional ---                           K22.2, Esophageal obstruction                           K29.70, Gastritis,  unspecified, without bleeding                           D64.9, Anemia, unspecified                           K92.1, Melena (includes Hematochezia)                           K92.2, Gastrointestinal hemorrhage, unspecified CPT copyright 2019 American Medical Association. All rights reserved. The codes documented in this report are preliminary and upon coder review may  be revised to meet current compliance requirements. Justice Britain, MD 01/16/2020 3:55:30 PM Number of Addenda: 0

## 2020-01-16 NOTE — Progress Notes (Signed)
PROGRESS NOTE  [name] IDP:[id number] DOB: 07/03/1931 DOA: 01/14/2020 PCP: Seward Carol, MD   LOS: 2 days   Brief Narrative / Interim history: 86 year old male with history of CVA and residual left-sided weakness, prediabetes, hyperlipidemia, chronic steroid use for sinusitis, combined systolic and diastolic CHF, recurrent lower GI bleed comes to the hospital with black stools.  Patient is on chronic Plavix for his CVA.  Unfortunately he was hospitalized about 6 times in the last year with recurrent GI bleed.  He underwent extensive work-up in the past with EGD, colonoscopy, bleeding scan.  Hemoglobin on admission was in the 4 range, he was given idiopathic blood cells.  Subjective / 24h Interval events: Doing well, no complaints other than inability to sleep.  He wants Ambien which he uses occasionally at home.  Awaiting EGD today  Assessment & Plan: Principal Problem Acute blood loss anemia secondary to recurrent GI bleed, melena -Patient received a total of 4 units of packed red blood cells so far.  GI consulted, appreciate input, he will undergo an EGD today.  Hemoglobin stable today and clinically his bleeding has stopped.  His Plavix is on hold  Active Problems History of CVA -On chronic Plavix, saw Dr. Leonie Man in September 2021, at that time recommendations were to continue Plavix.  -His current hospitalization is the 6th one for GI bleed in the last year and second one in November alone.  Have called and discussed with Dr. Erlinda Hong, for now see what the GI work-up reveals but he suggests transitioning to aspirin if okay with GI, but really depends on the EGD  Combined systolic and diastolic CHF -Received 4 days of packed red blood cells, received Lasix, appears euvolemic this morning.  Breathing comfortably. -Most recent 2D echo was done 3 years ago showed an EF of 45%.  Chronic steroid use for sinusitis -Continue prednisone  Presyncopal episode secondary to symptomatic  anemia  Scheduled Meds: . budesonide  2 mL Nebulization BID  . predniSONE  5 mg Oral Daily  . rOPINIRole  3 mg Oral QHS  . simvastatin  40 mg Oral QHS   Continuous Infusions: PRN Meds:.albuterol, fluticasone, montelukast, zolpidem  Diet Orders (From admission, onward)    Start     Ordered   01/16/20 0001  Diet NPO time specified Except for: Sips with Meds  Diet effective midnight       Question:  Except for  Answer:  Ferrel Logan with Meds   01/15/20 1731          DVT prophylaxis: SCDs Start: 01/14/20 1727     Code Status: DNR  Family Communication: no family at bedside   Status is: Inpatient  Remains inpatient appropriate because:IV treatments appropriate due to intensity of illness or inability to take PO and Inpatient level of care appropriate due to severity of illness   Dispo: The patient is from: Home              Anticipated d/c is to: Home              Anticipated d/c date is: 2 days              Patient currently is not medically stable to d/c.  Consultants:  GI  Procedures:  None   Microbiology  None   Antimicrobials: None     Objective: Vitals:   01/15/20 2033 01/15/20 2037 01/16/20 0437 01/16/20 0941  BP: 132/76  132/72   Pulse: 75  77   Resp: 17  16   Temp: 97.8 F (36.6 C)  97.9 F (36.6 C)   TempSrc: Oral  Oral   SpO2: 97% 97% 95% 97%  Weight:      Height:        Intake/Output Summary (Last 24 hours) at 01/16/2020 1025 Last data filed at 01/16/2020 9735 Gross per 24 hour  Intake 1791 ml  Output 825 ml  Net 966 ml   Filed Weights   01/15/20 1347  Weight: 68.1 kg    Examination:  Constitutional: No distress, in bed Eyes: No icterus ENMT: Moist mucous membranes Neck: normal, supple Respiratory: Lungs are clear to auscultation, no wheezing, no crackles  Cardiovascular: Regular rate and rhythm, no murmurs, trace edema Abdomen: Soft, nontender, nondistended, bowel sounds positive Musculoskeletal: no clubbing / cyanosis.  Skin:  No rash appreciated Neurologic: At his baseline, slightly weaker on the left lower extremity but otherwise no new focal deficits   Data Reviewed: I have independently reviewed following labs and imaging studies   CBC: Recent Labs  Lab 01/14/20 1355 01/15/20 0431 01/15/20 2022 01/16/20 0538  WBC 13.4* 10.5  --  8.2  HGB 4.4* 5.9*  6.0* 8.4* 8.2*  HCT 15.0* 18.7*  19.2* 26.1* 25.9*  MCV 100.7* 90.3  --  92.5  PLT 317 234  --  329   Basic Metabolic Panel: Recent Labs  Lab 01/14/20 1355 01/15/20 0431 01/16/20 0538  NA 133* 138 139  K 3.6 3.3* 3.5  CL 105 108 108  CO2 22 23 26   GLUCOSE 126* 131* 102*  BUN 47* 36* 22  CREATININE 0.85 0.75 0.77  CALCIUM 8.0* 7.4* 7.8*   Liver Function Tests: No results for input(s): AST, ALT, ALKPHOS, BILITOT, PROT, ALBUMIN in the last 168 hours. Coagulation Profile: Recent Labs  Lab 01/14/20 1355  INR 0.9   HbA1C: No results for input(s): HGBA1C in the last 72 hours. CBG: No results for input(s): GLUCAP in the last 168 hours.  Recent Results (from the past 240 hour(s))  Resp Panel by RT-PCR (Flu A&B, Covid) Nasopharyngeal Swab     Status: None   Collection Time: 01/14/20  2:59 PM   Specimen: Nasopharyngeal Swab; Nasopharyngeal(NP) swabs in vial transport medium  Result Value Ref Range Status   SARS Coronavirus 2 by RT PCR NEGATIVE NEGATIVE Final    Comment: (NOTE) SARS-CoV-2 target nucleic acids are NOT DETECTED.  The SARS-CoV-2 RNA is generally detectable in upper respiratory specimens during the acute phase of infection. The lowest concentration of SARS-CoV-2 viral copies this assay can detect is 138 copies/mL. A negative result does not preclude SARS-Cov-2 infection and should not be used as the sole basis for treatment or other patient management decisions. A negative result may occur with  improper specimen collection/handling, submission of specimen other than nasopharyngeal swab, presence of viral mutation(s) within  the areas targeted by this assay, and inadequate number of viral copies(<138 copies/mL). A negative result must be combined with clinical observations, patient history, and epidemiological information. The expected result is Negative.  Fact Sheet for Patients:  EntrepreneurPulse.com.au  Fact Sheet for Healthcare Providers:  IncredibleEmployment.be  This test is no t yet approved or cleared by the [place] FDA and  has been authorized for detection and/or diagnosis of SARS-CoV-2 by FDA under an Emergency Use Authorization (EUA). This EUA will remain  in effect (meaning this test can be used) for the duration of the COVID-19 declaration under Section 564(b)(1) of the Act, 21 U.S.C.section 360bbb-3(b)(1), unless the authorization is  terminated  or revoked sooner.       Influenza A by PCR NEGATIVE NEGATIVE Final   Influenza B by PCR NEGATIVE NEGATIVE Final    Comment: (NOTE) The Xpert Xpress SARS-CoV-2/FLU/RSV plus assay is intended as an aid in the diagnosis of influenza from Nasopharyngeal swab specimens and should not be used as a sole basis for treatment. Nasal washings and aspirates are unacceptable for Xpert Xpress SARS-CoV-2/FLU/RSV testing.  Fact Sheet for Patients: EntrepreneurPulse.com.au  Fact Sheet for Healthcare Providers: IncredibleEmployment.be  This test is not yet approved or cleared by the [place] FDA and has been authorized for detection and/or diagnosis of SARS-CoV-2 by FDA under an Emergency Use Authorization (EUA). This EUA will remain in effect (meaning this test can be used) for the duration of the COVID-19 declaration under Section 564(b)(1) of the Act, 21 U.S.C. section 360bbb-3(b)(1), unless the authorization is terminated or revoked.  Performed at [facility], [place] [address] [place], [place] [address]      Radiology Studies: No results  found.  Marzetta Board, MD, PhD [facility]  Between 7 am - 7 pm I am available, please contact me via Amion or Securechat  Between 7 pm - 7 am I am not available, please contact night coverage MD/APP via Amion

## 2020-01-16 NOTE — Anesthesia Postprocedure Evaluation (Signed)
Anesthesia Post Note  Patient: [name]  Procedure(s) Performed: ENTEROSCOPY (N/A ) SUBMUCOSAL TATTOO INJECTION SAVORY DILATION (N/A ) GIVENS CAPSULE STUDY (N/A )     Patient location during evaluation: PACU Anesthesia Type: MAC Level of consciousness: awake and alert Pain management: pain level controlled Vital Signs Assessment: post-procedure vital signs reviewed and stable Respiratory status: spontaneous breathing, nonlabored ventilation, respiratory function stable and patient connected to nasal cannula oxygen Cardiovascular status: stable and blood pressure returned to baseline Postop Assessment: no apparent nausea or vomiting Anesthetic complications: no   No complications documented.  Last Vitals:  Vitals:   01/16/20 1440 01/16/20 1528  BP: (!) 149/58 136/81  Pulse: 61 70  Resp: 17 20  Temp:  36.4 C  SpO2: 99% 98%    Last Pain:  Vitals:   01/16/20 1528  TempSrc: Oral  PainSc:                  Tiajuana Amass

## 2020-01-16 NOTE — Interval H&P Note (Signed)
History and Physical Interval Note:  01/16/2020 12:49 PM  [name]  has presented today for surgery, with the diagnosis of gastrointestinal bleeding.  The various methods of treatment have been discussed with the patient and family. After consideration of risks, benefits and other options for treatment, the patient has consented to  Procedure(s): ENTEROSCOPY (N/A) as a surgical intervention.  The patient's history has been reviewed, patient examined, no change in status, stable for surgery.  I have reviewed the patient's chart and labs.  Questions were answered to the patient's satisfaction.    Possible VCE depending on findings if there is a source of bleeding or not on SBE.   [employer]

## 2020-01-16 NOTE — Anesthesia Preprocedure Evaluation (Addendum)
Anesthesia Evaluation  Patient identified by MRN, date of birth, ID band Patient awake    Reviewed: Allergy & Precautions, NPO status , Patient's Chart, lab work & pertinent test results  History of Anesthesia Complications Negative for: history of anesthetic complications  Airway Mallampati: II  TM Distance: >3 FB Neck ROM: Full    Dental  (+) Lower Dentures, Missing   Pulmonary asthma ,    Pulmonary exam normal        Cardiovascular hypertension, Pt. on medications and Pt. on home beta blockers +CHF  Normal cardiovascular exam  EKG 01/15/20: sinus tachycardia  '18 TTE - EF 45%. Grade 1 diastolic dysfunction.     Neuro/Psych neg Seizures TIACVA (on plavix), Residual Symptoms negative psych ROS   GI/Hepatic Neg liver ROS, hiatal hernia, GERD  Controlled,  Endo/Other  diabetes, Type 2, Oral Hypoglycemic Agents  Renal/GU negative Renal ROS  negative genitourinary   Musculoskeletal  (+) Arthritis ,   Abdominal   Peds negative pediatric ROS (+)  Hematology  (+) anemia , Hgb 5.9 now 8.2  S/p splenectomy    Anesthesia Other Findings   Reproductive/Obstetrics                             Anesthesia Physical  Anesthesia Plan  ASA: III  Anesthesia Plan: MAC   Post-op Pain Management:    Induction: Intravenous  PONV Risk Score and Plan: 1 and Propofol infusion and Treatment may vary due to age or medical condition  Airway Management Planned: Nasal Cannula and Natural Airway  Additional Equipment: None  Intra-op Plan:   Post-operative Plan:   Informed Consent: I have reviewed the patients History and Physical, chart, labs and discussed the procedure including the risks, benefits and alternatives for the proposed anesthesia with the patient or authorized representative who has indicated his/her understanding and acceptance.   Patient has DNR.  Discussed DNR with patient.      Plan Discussed with: CRNA  Anesthesia Plan Comments:        Anesthesia Quick Evaluation

## 2020-01-16 NOTE — Plan of Care (Addendum)
-   NPO p Mn instructed for possible EGD tomorrow, verbalized understanding, Noted result of repeat H&H came back with Hgb 8.4 Problem: Education: Goal: Knowledge of General Education information will improve Description: Including pain rating scale, medication(s)/side effects and non-pharmacologic comfort measures Outcome: Progressing   Problem: Health Behavior/Discharge Planning: Goal: Ability to manage health-related needs will improve Outcome: Progressing   Problem: Clinical Measurements: Goal: Ability to maintain clinical measurements within normal limits will improve Outcome: Progressing Goal: Will remain free from infection Outcome: Progressing Goal: Diagnostic test results will improve Outcome: Progressing Goal: Respiratory complications will improve Outcome: Progressing Goal: Cardiovascular complication will be avoided Outcome: Progressing   Problem: Activity: Goal: Risk for activity intolerance will decrease Outcome: Progressing   Problem: Nutrition: Goal: Adequate nutrition will be maintained Outcome: Progressing  Problem: Coping: Goal: Level of anxiety will decrease Outcome: Progressing   Problem: Elimination: Goal: Will not experience complications related to bowel motility Outcome: Progressing Goal: Will not experience complications related to urinary retention Outcome: Progressing   Problem: Pain Managment: Goal: General experience of comfort will improve Outcome: Progressing   Problem: Safety: Goal: Ability to remain free from injury will improve Outcome: Progressing   Problem: Skin Integrity: Goal: Risk for impaired skin integrity will decrease Outcome: Progressing

## 2020-01-16 NOTE — Transfer of Care (Signed)
Immediate Anesthesia Transfer of Care Note  Patient: [name]  Procedure(s) Performed: ENTEROSCOPY (N/A ) SUBMUCOSAL TATTOO INJECTION SAVORY DILATION (N/A )  Patient Location: PACU  Anesthesia Type:General  Level of Consciousness: drowsy  Airway & Oxygen Therapy: Patient Spontanous Breathing and Patient connected to face mask oxygen  Post-op Assessment: Report given to RN and Post -op Vital signs reviewed and stable  Post vital signs: Reviewed and stable  Last Vitals:  Vitals Value Taken Time  BP 111/54 01/16/20 1356  Temp 36.6 C 01/16/20 1350  Pulse 57 01/16/20 1359  Resp 18 01/16/20 1359  SpO2 98 % 01/16/20 1359  Vitals shown include unvalidated device data.  Last Pain:  Vitals:   01/16/20 1350  TempSrc: Axillary  PainSc: Asleep      Patients Stated Pain Goal: 0 (23/55/73 2202)  Complications: No complications documented.

## 2020-01-16 NOTE — Evaluation (Signed)
Physical Therapy Evaluation Patient Details Name: [name] MRN: [id number] DOB: 1931/07/04 Today's Date: 01/16/2020   History of Present Illness  86 yo male admitted with acute blood loss anemia, recurrent GI bleed. hx of recurrent diverticular bleed, CVA, DM, OA, CHF. Recent d/c 11/22.  Clinical Impression  On eval, pt was Min guard assist for mobility. He walked ~100 feet with a RW. Pt tolerated activity well. No c/o lightheadedness/dizziness. Planned procedure later today. Will continue to follow pt and progress activity as tolerated.     Follow Up Recommendations Home health PT (if pt is agreeable)    Equipment Recommendations  None recommended by PT    Recommendations for Other Services       Precautions / Restrictions Precautions Precautions: Fall Required Braces or Orthoses: Other Brace Other Brace: L knee brace and L AFO Restrictions Weight Bearing Restrictions: No      Mobility  Bed Mobility Overal bed mobility: Modified Independent                  Transfers Overall transfer level: Needs assistance Equipment used: Rolling walker (2 wheeled) Transfers: Sit to/from Stand Sit to Stand: Min guard         General transfer comment: Min guard for safety. Cues for safety.  Ambulation/Gait Ambulation/Gait assistance: Min guard Gait Distance (Feet): 100 Feet Assistive device: Rolling walker (2 wheeled) Gait Pattern/deviations: Step-through pattern;Decreased stride length;Trunk flexed;Decreased dorsiflexion - left     General Gait Details: cues for trunk extension, upward gaze as possible. pt unsteady initially however no overt LOB. tolerated distance well. no c/o lightheadness/dizziness. increased time for toe clearance on L  Stairs            Wheelchair Mobility    Modified Rankin (Stroke Patients Only)       Balance Overall balance assessment: Needs assistance         Standing balance support: Bilateral upper extremity  supported Standing balance-Leahy Scale: Fair                               Pertinent Vitals/Pain Pain Assessment: No/denies pain    Home Living   Living Arrangements: Spouse/significant other Available Help at Discharge: Family;Available 24 hours/day Type of Home: Independent living facility Home Access: Level entry     Home Layout: One level Home Equipment: Grab bars - tub/shower;Grab bars - toilet;Walker - 2 wheels;Walker - 4 wheels;Wheelchair - manual;Hand held shower head;Shower seat - built in;Transport chair      Prior Function Level of Independence: Independent with assistive device(s)         Comments: Uses rollator - can walk community distances if he has to but typically does not; independent with ADLs- IADLs partially managed by facility and wife. uses golf cart to get around [place] Hand: Right    Extremity/Trunk Assessment   Upper Extremity Assessment Upper Extremity Assessment: Generalized weakness    Lower Extremity Assessment RLE Deficits / Details: generalized weakness LLE Deficits / Details: baseline deficits d/t old CVA, wears Knee brace and AFO    Cervical / Trunk Assessment Cervical / Trunk Assessment: Normal  Communication   Communication: No difficulties  Cognition Arousal/Alertness: Awake/alert Behavior During Therapy: WFL for tasks assessed/performed Overall Cognitive Status: Within Functional Limits for tasks assessed  General Comments      Exercises     Assessment/Plan    PT Assessment Patient needs continued PT services  PT Problem List Decreased mobility;Decreased strength;Decreased balance       PT Treatment Interventions DME instruction;Gait training;Therapeutic activities;Therapeutic exercise;Patient/family education;Balance training;Functional mobility training    PT Goals (Current goals can be found in the Care Plan  section)  Acute Rehab PT Goals Patient Stated Goal: To increase strength PT Goal Formulation: With patient/family Time For Goal Achievement: 01/30/20 Potential to Achieve Goals: Good    Frequency Min 3X/week   Barriers to discharge        Co-evaluation               AM-PAC PT "6 Clicks" Mobility  Outcome Measure Help needed turning from your back to your side while in a flat bed without using bedrails?: None Help needed moving from lying on your back to sitting on the side of a flat bed without using bedrails?: None Help needed moving to and from a bed to a chair (including a wheelchair)?: A Little Help needed standing up from a chair using your arms (e.g., wheelchair or bedside chair)?: A Little Help needed to walk in hospital room?: A Little Help needed climbing 3-5 steps with a railing? : A Little 6 Click Score: 20    End of Session Equipment Utilized During Treatment: Gait belt Activity Tolerance: Patient tolerated treatment well Patient left: in bed;with call bell/phone within reach;with bed alarm set;with family/visitor present   PT Visit Diagnosis: Muscle weakness (generalized) (M62.81)    Time: 1017-5102 PT Time Calculation (min) (ACUTE ONLY): 19 min   Charges:   PT Evaluation $PT Eval Low Complexity: [place], PT Acute Rehabilitation  Office: [phone] Pager: [phone]

## 2020-01-17 ENCOUNTER — Encounter (HOSPITAL_COMMUNITY): Payer: Self-pay | Admitting: Gastroenterology

## 2020-01-17 DIAGNOSIS — D62 Acute posthemorrhagic anemia: Secondary | ICD-10-CM | POA: Diagnosis not present

## 2020-01-17 LAB — COMPREHENSIVE METABOLIC PANEL
ALT: 13 U/L (ref 0–44)
AST: 17 U/L (ref 15–41)
Albumin: 2.8 g/dL — ABNORMAL LOW (ref 3.5–5.0)
Alkaline Phosphatase: 45 U/L (ref 38–126)
Anion gap: 4 — ABNORMAL LOW (ref 5–15)
BUN: 14 mg/dL (ref 8–23)
CO2: 26 mmol/L (ref 22–32)
Calcium: 7.9 mg/dL — ABNORMAL LOW (ref 8.9–10.3)
Chloride: 105 mmol/L (ref 98–111)
Creatinine, Ser: 0.73 mg/dL (ref 0.61–1.24)
GFR, Estimated: >60 mL/min (ref >60)
Glucose, Bld: 97 mg/dL (ref 70–99)
Potassium: 3.5 mmol/L (ref 3.5–5.1)
Sodium: 135 mmol/L (ref 135–145)
Total Bilirubin: 0.9 mg/dL (ref 0.3–1.2)
Total Protein: 4.5 g/dL — ABNORMAL LOW (ref 6.5–8.1)

## 2020-01-17 LAB — CBC
HCT: 26.7 % — ABNORMAL LOW (ref 39.0–52.0)
Hemoglobin: 8.5 g/dL — ABNORMAL LOW (ref 13.0–17.0)
MCH: 29.5 pg (ref 26.0–34.0)
MCHC: 31.8 g/dL (ref 30.0–36.0)
MCV: 92.7 fL (ref 80.0–100.0)
Platelets: 250 10*3/uL (ref 150–400)
RBC: 2.88 MIL/uL — ABNORMAL LOW (ref 4.22–5.81)
RDW: 20.4 % — ABNORMAL HIGH (ref 11.5–15.5)
WBC: 12.3 10*3/uL — ABNORMAL HIGH (ref 4.0–10.5)
nRBC: 4.5 % — ABNORMAL HIGH (ref 0.0–0.2)

## 2020-01-17 MED ORDER — FOLIC ACID 1 MG PO TABS
1.0000 mg | ORAL_TABLET | Freq: Once | ORAL | Status: AC
  Administered 2020-01-17: 1 mg via ORAL
  Filled 2020-01-17: qty 1

## 2020-01-17 MED ORDER — FE FUMARATE-B12-VIT C-FA-IFC PO CAPS
1.0000 | ORAL_CAPSULE | Freq: Three times a day (TID) | ORAL | Status: DC
  Administered 2020-01-18 – 2020-01-22 (×11): 1 via ORAL
  Filled 2020-01-17 (×15): qty 1

## 2020-01-17 MED ORDER — POLYETHYLENE GLYCOL 3350 17 GM/SCOOP PO POWD
0.5000 | Freq: Once | ORAL | Status: AC
  Administered 2020-01-17: 127.5 g via ORAL
  Filled 2020-01-17: qty 238

## 2020-01-17 MED ORDER — CYANOCOBALAMIN 1000 MCG/ML IJ SOLN
1000.0000 ug | Freq: Once | INTRAMUSCULAR | Status: AC
  Administered 2020-01-17: 1000 ug via SUBCUTANEOUS
  Filled 2020-01-17: qty 1

## 2020-01-17 NOTE — Care Management Important Message (Signed)
Important Message  Patient Details IM Letter given to the Patient. Name: [name] MRN: [id number] Date of Birth: [birth year]   Medicare Important Message Given:  Yes     Kerin Salen 01/17/2020, 10:35 AM

## 2020-01-17 NOTE — Progress Notes (Signed)
PROGRESS NOTE  [name] YFV:[id number] DOB: 1931/11/23 DOA: 01/14/2020 PCP: Seward Carol, MD   LOS: 3 days   Brief Narrative / Interim history: 86 year old male with history of CVA and residual left-sided weakness, prediabetes, hyperlipidemia, chronic steroid use for sinusitis, combined systolic and diastolic CHF, recurrent lower GI bleed comes to the hospital with black stools.  Patient is on chronic Plavix for his CVA.  Unfortunately he was hospitalized about 6 times in the last year with recurrent GI bleed.  He underwent extensive work-up in the past with EGD, colonoscopy, bleeding scan.  Hemoglobin on admission was in the 4 range, he was given idiopathic blood cells.  Subjective / 24h Interval events: No nausea no vomiting.  Still has some black-colored bowel movement no abdominal pain.  No fever no chills.  Assessment & Plan: Principal Problem Acute blood loss anemia secondary to recurrent GI bleed, melena -Patient received a total of 4 units of packed red blood cells so far.  GI consulted, appreciate input, EGD negative for any acute bleeding.  Had benign narrowing of the esophagus which was dilated. Capsule endoscopy was performed which did not pass beyond stomach. Endoscopic capsule placement likely tomorrow. Hemoglobin stable today and clinically his bleeding has stopped.  His Plavix is on hold  Active Problems History of CVA -On chronic Plavix, saw Dr. Leonie Man in September 2021, at that time recommendations were to continue Plavix.  -His current hospitalization is the 6th one for GI bleed in the last year and second one in November alone.  Have called and discussed with Dr. Erlinda Hong, for now see what the GI work-up reveals but he suggests transitioning to aspirin if okay with GI, but really depends on the EGD  Combined systolic and diastolic CHF -Received 4 days of packed red blood cells, received Lasix, appears euvolemic this morning.  Breathing comfortably. -Most recent 2D echo  was done 3 years ago showed an EF of 45%.  Chronic steroid use for sinusitis -Continue prednisone  Presyncopal episode secondary to symptomatic anemia  Scheduled Meds:  budesonide  2 mL Nebulization BID   [START ON 01/18/2020] ferrous [id number] C-folic acid  1 capsule Oral TID PC   polyethylene glycol powder  0.5 Container Oral Once   predniSONE  5 mg Oral Daily   rOPINIRole  3 mg Oral QHS   simvastatin  40 mg Oral QHS   Continuous Infusions: PRN Meds:.albuterol, fluticasone, montelukast, zolpidem  Diet Orders (From admission, onward)    Start     Ordered   01/18/20 0001  Diet NPO time specified Except for: Sips with Meds  Diet effective midnight       Question:  Except for  Answer:  Ferrel Logan with Meds   01/17/20 1543   01/17/20 1544  Diet clear liquid Room service appropriate? Yes; Fluid consistency: Thin  Diet effective now       Question Answer Comment  Room service appropriate? Yes   Fluid consistency: Thin      01/17/20 1543          DVT prophylaxis: SCDs Start: 01/14/20 1727     Code Status: DNR  Family Communication: family at bedside   Status is: Inpatient  Remains inpatient appropriate because:IV treatments appropriate due to intensity of illness or inability to take PO and Inpatient level of care appropriate due to severity of illness   Dispo: The patient is from: Home              Anticipated d/c is to:  Home              Anticipated d/c date is: 2 days              Patient currently is not medically stable to d/c.  Consultants:  GI  Procedures:  None   Microbiology  None   Antimicrobials: None     Objective: Vitals:   01/16/20 2045 01/16/20 2057 01/17/20 0459 01/17/20 1322  BP:  140/77 123/70 126/73  Pulse:  75 85 72  Resp:  18 20 18   Temp:  98.5 F (36.9 C) 98.1 F (36.7 C) 98.1 F (36.7 C)  TempSrc:  Oral Oral   SpO2: 98% 100% 95% 97%  Weight:      Height:        Intake/Output Summary (Last 24 hours) at 01/17/2020  1912 Last data filed at 01/17/2020 5631 Gross per 24 hour  Intake 360 ml  Output 600 ml  Net -240 ml   Filed Weights   01/15/20 1347  Weight: 68.1 kg    Examination:  General: Appear in mild distress, no Rash; Oral Mucosa Clear, moist. no Abnormal Neck Mass Or lumps, Conjunctiva normal  Cardiovascular: S1 and S2 Present, aortic systolic  Murmur, Respiratory: good respiratory effort, Bilateral Air entry present and CTA, no Crackles, no wheezes Abdomen: Bowel Sound present, Soft and no tenderness Extremities: trace Pedal edema Neurology: alert and oriented to time and place affect appropriate. no new focal deficit Gait not checked due to patient safety concerns    Data Reviewed: I have independently reviewed following labs and imaging studies   CBC: Recent Labs  Lab 01/14/20 1355 01/15/20 0431 01/15/20 2022 01/16/20 0538 01/17/20 0515  WBC 13.4* 10.5  --  8.2 12.3*  HGB 4.4* 5.9*   6.0* 8.4* 8.2* 8.5*  HCT 15.0* 18.7*   19.2* 26.1* 25.9* 26.7*  MCV 100.7* 90.3  --  92.5 92.7  PLT 317 234  --  234 497   Basic Metabolic Panel: Recent Labs  Lab 01/14/20 1355 01/15/20 0431 01/16/20 0538 01/17/20 0515  NA 133* 138 139 135  K 3.6 3.3* 3.5 3.5  CL 105 108 108 105  CO2 22 23 26 26   GLUCOSE 126* 131* 102* 97  BUN 47* 36* 22 14  CREATININE 0.85 0.75 0.77 0.73  CALCIUM 8.0* 7.4* 7.8* 7.9*   Liver Function Tests: Recent Labs  Lab 01/17/20 0515  AST 17  ALT 13  ALKPHOS 45  BILITOT 0.9  PROT 4.5*  ALBUMIN 2.8*   Coagulation Profile: Recent Labs  Lab 01/14/20 1355  INR 0.9   HbA1C: No results for input(s): HGBA1C in the last 72 hours. CBG: Recent Labs  Lab 01/16/20 1231  GLUCAP 114*    Recent Results (from the past 240 hour(s))  Resp Panel by RT-PCR (Flu A&B, Covid) Nasopharyngeal Swab     Status: None   Collection Time: 01/14/20  2:59 PM   Specimen: Nasopharyngeal Swab; Nasopharyngeal(NP) swabs in vial transport medium  Result Value Ref Range  Status   SARS Coronavirus 2 by RT PCR NEGATIVE NEGATIVE Final    Comment: (NOTE) SARS-CoV-2 target nucleic acids are NOT DETECTED.  The SARS-CoV-2 RNA is generally detectable in upper respiratory specimens during the acute phase of infection. The lowest concentration of SARS-CoV-2 viral copies this assay can detect is 138 copies/mL. A negative result does not preclude SARS-Cov-2 infection and should not be used as the sole basis for treatment or other patient management decisions. A negative result  may occur with  improper specimen collection/handling, submission of specimen other than nasopharyngeal swab, presence of viral mutation(s) within the areas targeted by this assay, and inadequate number of viral copies(<138 copies/mL). A negative result must be combined with clinical observations, patient history, and epidemiological information. The expected result is Negative.  Fact Sheet for Patients:  EntrepreneurPulse.com.au  Fact Sheet for Healthcare Providers:  IncredibleEmployment.be  This test is no t yet approved or cleared by the [place] FDA and  has been authorized for detection and/or diagnosis of SARS-CoV-2 by FDA under an Emergency Use Authorization (EUA). This EUA will remain  in effect (meaning this test can be used) for the duration of the COVID-19 declaration under Section 564(b)(1) of the Act, 21 U.S.C.section 360bbb-3(b)(1), unless the authorization is terminated  or revoked sooner.       Influenza A by PCR NEGATIVE NEGATIVE Final   Influenza B by PCR NEGATIVE NEGATIVE Final    Comment: (NOTE) The Xpert Xpress SARS-CoV-2/FLU/RSV plus assay is intended as an aid in the diagnosis of influenza from Nasopharyngeal swab specimens and should not be used as a sole basis for treatment. Nasal washings and aspirates are unacceptable for Xpert Xpress SARS-CoV-2/FLU/RSV testing.  Fact Sheet for  Patients: EntrepreneurPulse.com.au  Fact Sheet for Healthcare Providers: IncredibleEmployment.be  This test is not yet approved or cleared by the [place] FDA and has been authorized for detection and/or diagnosis of SARS-CoV-2 by FDA under an Emergency Use Authorization (EUA). This EUA will remain in effect (meaning this test can be used) for the duration of the COVID-19 declaration under Section 564(b)(1) of the Act, 21 U.S.C. section 360bbb-3(b)(1), unless the authorization is terminated or revoked.  Performed at [facility], [place] [address], [place], [place] [address]      Radiology Studies: No results found. Author:  Berle Mull, MD [facility] 01/17/2020  7:14 PM   To reach On-call, see care teams to locate the attending and reach out to them via [email or web address]. If 7PM-7AM, please contact night-coverage If you still have difficulty reaching the attending provider, please page the [facility] (Director on Call) for [facility] on amion for assistance.

## 2020-01-17 NOTE — H&P (View-Only) (Signed)
Unfortunately the video capsule didn't pass out of patient's stomach. Plan is for EGD to place capsule in small bowel. We are trying to find a time for the procedure. I have updated patient and his wife

## 2020-01-17 NOTE — Progress Notes (Signed)
Physical Therapy Treatment Patient Details Name: [name] MRN: [id number] DOB: 23-Dec-1931 Today's Date: 01/17/2020    History of Present Illness 86 yo male admitted with acute blood loss anemia, recurrent GI bleed. hx of recurrent diverticular bleed, CVA, DM, OA, CHF. Recent d/c 11/22.    PT Comments    Pt continues to participate well. Discussed d/c plan with pt and wife. Wife expresses concerns about managing pt's care at home. Both are agreeable to a short rehab stay at [facility] rehab section if there is a bed available. If SNF is not possible, recommend HHPT f/u.     Follow Up Recommendations  SNF (wife is concerned about pt's weakness and being able to care for him at home)     Equipment Recommendations  None recommended by PT    Recommendations for Other Services       Precautions / Restrictions Precautions Precautions: Fall Required Braces or Orthoses: Other Brace Other Brace: L knee brace and L AFO    Mobility  Bed Mobility Overal bed mobility: Needs Assistance;Modified Independent Bed Mobility: Supine to Sit;Sit to Supine              Transfers Overall transfer level: Needs assistance Equipment used: Rolling walker (2 wheeled) Transfers: Sit to/from Stand Sit to Stand: Min guard         General transfer comment: Min guard for safety. Cues for safety.  Ambulation/Gait Ambulation/Gait assistance: Min guard Gait Distance (Feet): 200 Feet Assistive device: Rolling walker (2 wheeled) Gait Pattern/deviations: Step-through pattern;Decreased stride length;Trunk flexed;Decreased dorsiflexion - left     General Gait Details: cues for trunk extension, upward gaze as possible.intermittent unsteadiness but no LOB. tolerated distance well. no c/o lightheadness/dizziness. increased time for toe clearance on L   Stairs             Wheelchair Mobility    Modified Rankin (Stroke Patients Only)       Balance Overall balance assessment:  Needs assistance         Standing balance support: Bilateral upper extremity supported Standing balance-Leahy Scale: Fair                              Cognition Arousal/Alertness: Awake/alert Behavior During Therapy: WFL for tasks assessed/performed Overall Cognitive Status: Within Functional Limits for tasks assessed                                        Exercises      General Comments        Pertinent Vitals/Pain Pain Assessment: No/denies pain    Home Living                      Prior Function            PT Goals (current goals can now be found in the care plan section) Progress towards PT goals: Progressing toward goals    Frequency    Min 3X/week      PT Plan Discharge plan needs to be updated    Co-evaluation              AM-PAC PT "6 Clicks" Mobility   Outcome Measure  Help needed turning from your back to your side while in a flat bed without using bedrails?: None Help needed moving from lying on your  back to sitting on the side of a flat bed without using bedrails?: None Help needed moving to and from a bed to a chair (including a wheelchair)?: A Little Help needed standing up from a chair using your arms (e.g., wheelchair or bedside chair)?: A Little Help needed to walk in hospital room?: A Little Help needed climbing 3-5 steps with a railing? : A Little 6 Click Score: 20    End of Session Equipment Utilized During Treatment: Gait belt Activity Tolerance: Patient tolerated treatment well Patient left: in bed;with call bell/phone within reach;with bed alarm set;with family/visitor present   PT Visit Diagnosis: Muscle weakness (generalized) (M62.81)     Time: 1497-0263 PT Time Calculation (min) (ACUTE ONLY): 20 min  Charges:  $Gait Training: 8-22 mins                        Doreatha Massed, PT Acute Rehabilitation  Office: [phone] Pager: [phone]

## 2020-01-17 NOTE — Progress Notes (Signed)
Progress Note  Chief Complaint:    Recurrent GI bleedingf     ASSESSMENT / PLAN:     # Recurrent GI bleed on plavix. No findings on small bowel enteroscopy yesterday to explain bleeding. Capsule endo completed today, await results. Hgb stable overnight, up from from 4.4 on admission to 8.5 after 4 units PRBC. Trying to have BM now so not yet sure if having ongoing bleeding   # Chronic intermittent solid food dysphagia. Found to have an upper esophageal stricture, s/p dilation yesterday       01/16/20 small bowel enteroscopy  -Benign-appearing esophageal stenoses at [facility]. Dilated. - No gross lesions in esophagus. Biopsied. - 4 cm hiatal hernia noted. - Gastritis. No other gross lesions in the stomach. Not biopsied as just biopsied earlier this year. - Normal mucosa was found in the entire examined duodenum. - Normal mucosa was found in the jejunum. Tattooed.     SUBJECTIVE:   Feels okay.     OBJECTIVE:    Scheduled inpatient medications:  . budesonide  2 mL Nebulization BID  . predniSONE  5 mg Oral Daily  . rOPINIRole  3 mg Oral QHS  . simvastatin  40 mg Oral QHS   Continuous inpatient infusions:  PRN inpatient medications: albuterol, fluticasone, montelukast, zolpidem  Vital signs in last 24 hours: Temp:  [97.6 F (36.4 C)-98.5 F (36.9 C)] 98.1 F (36.7 C) (12/01 0459) Pulse Rate:  [58-85] 85 (12/01 0459) Resp:  [17-23] 20 (12/01 0459) BP: (94-156)/(52-93) 123/70 (12/01 0459) SpO2:  [91 %-100 %] 95 % (12/01 0459) Last BM Date: 01/15/20  Intake/Output Summary (Last 24 hours) at 01/17/2020 1010 Last data filed at 01/17/2020 0858 Gross per 24 hour  Intake 1360 ml  Output 900 ml  Net 460 ml     Physical Exam:  . General: Alert male in NAD sitting on BSC . Heart:  Regular rate  . Pulmonary: Normal respiratory effort . Abdomen: Soft, nondistended, nontender. Normal bowel sounds.  . Neurologic: Alert and oriented . Psych: Pleasant. Cooperative.    Filed Weights   01/15/20 1347  Weight: 68.1 kg    Intake/Output from previous day: 11/30 0701 - 12/01 0700 In: 3734 [P.O.:320; I.V.:900] Out: 1000 [Urine:1000] Intake/Output this shift: Total I/O In: 240 [P.O.:240] Out: -     Lab Results: Recent Labs    01/15/20 0431 01/15/20 0431 01/15/20 2022 01/16/20 0538 01/17/20 0515  WBC 10.5  --   --  8.2 12.3*  HGB 5.9*  6.0*   < > 8.4* 8.2* 8.5*  HCT 18.7*  19.2*   < > 26.1* 25.9* 26.7*  PLT 234  --   --  234 250   < > = values in this interval not displayed.   BMET Recent Labs    01/15/20 0431 01/16/20 0538 01/17/20 0515  NA 138 139 135  K 3.3* 3.5 3.5  CL 108 108 105  CO2 23 26 26   GLUCOSE 131* 102* 97  BUN 36* 22 14  CREATININE 0.75 0.77 0.73  CALCIUM 7.4* 7.8* 7.9*   LFT Recent Labs    01/17/20 0515  PROT 4.5*  ALBUMIN 2.8*  AST 17  ALT 13  ALKPHOS 45  BILITOT 0.9   PT/INR Recent Labs    01/14/20 1355  LABPROT 11.8  INR 0.9   Hepatitis Panel No results for input(s): HEPBSAG, HCVAB, HEPAIGM, HEPBIGM in the last 72 hours.  No results found.    Principal Problem:   Acute  blood loss anemia Active Problems:   History of CVA (cerebrovascular accident)   Postural dizziness with presyncope   Chronic combined systolic and diastolic CHF (congestive heart failure) ([place])   DNR (do not resuscitate)   Acute GI bleeding     LOS: 3 days   Tye Savoy ,NP 01/17/2020, 10:10 AM

## 2020-01-17 NOTE — Progress Notes (Signed)
Unfortunately the video capsule didn't pass out of patient's stomach. Plan is for EGD to place capsule in small bowel. We are trying to find a time for the procedure. I have updated patient and his wife

## 2020-01-17 NOTE — Anesthesia Preprocedure Evaluation (Addendum)
Anesthesia Evaluation  Patient identified by MRN, date of birth, ID band Patient awake    Reviewed: Allergy & Precautions, NPO status , Patient's Chart, lab work & pertinent test results  Airway Mallampati: II  TM Distance: >3 FB Neck ROM: Full    Dental no notable dental hx. (+) Lower Dentures, Missing,    Pulmonary asthma ,    Pulmonary exam normal breath sounds clear to auscultation       Cardiovascular hypertension, Pt. on medications Normal cardiovascular exam Rhythm:Regular Rate:Normal     Neuro/Psych Seizures -,  TIA Neuromuscular disease CVA    GI/Hepatic hiatal hernia, GERD  ,  Endo/Other  diabetes  Renal/GU      Musculoskeletal  (+) Arthritis ,   Abdominal   Peds  Hematology  (+) anemia ,   Anesthesia Other Findings   Reproductive/Obstetrics                            Anesthesia Physical Anesthesia Plan  ASA: III  Anesthesia Plan: MAC   Post-op Pain Management:    Induction: Intravenous  PONV Risk Score and Plan: Treatment may vary due to age or medical condition, Ondansetron and Dexamethasone  Airway Management Planned: Natural Airway and Nasal Cannula  Additional Equipment:   Intra-op Plan:   Post-operative Plan:   Informed Consent: I have reviewed the patients History and Physical, chart, labs and discussed the procedure including the risks, benefits and alternatives for the proposed anesthesia with the patient or authorized representative who has indicated his/her understanding and acceptance.     Dental advisory given  Plan Discussed with: CRNA and Anesthesiologist  Anesthesia Plan Comments: (EGD forGI bleed)      Anesthesia Quick Evaluation

## 2020-01-18 ENCOUNTER — Inpatient Hospital Stay (HOSPITAL_COMMUNITY): Payer: Medicare Other | Admitting: Anesthesiology

## 2020-01-18 ENCOUNTER — Encounter (HOSPITAL_COMMUNITY)
Admission: EM | Disposition: A | Discharge status: Skilled Nursing Facility | Payer: Self-pay | Source: Skilled Nursing Facility | Attending: Internal Medicine

## 2020-01-18 ENCOUNTER — Encounter (HOSPITAL_COMMUNITY): Payer: Self-pay | Admitting: Internal Medicine

## 2020-01-18 DIAGNOSIS — D62 Acute posthemorrhagic anemia: Secondary | ICD-10-CM | POA: Diagnosis not present

## 2020-01-18 HISTORY — PX: ESOPHAGOGASTRODUODENOSCOPY (EGD) WITH PROPOFOL: SHX5813

## 2020-01-18 HISTORY — PX: GIVENS CAPSULE STUDY: SHX5432

## 2020-01-18 LAB — CBC
HCT: 28.9 % — ABNORMAL LOW (ref 39.0–52.0)
Hemoglobin: 8.9 g/dL — ABNORMAL LOW (ref 13.0–17.0)
MCH: 29 pg (ref 26.0–34.0)
MCHC: 30.8 g/dL (ref 30.0–36.0)
MCV: 94.1 fL (ref 80.0–100.0)
Platelets: 291 10*3/uL (ref 150–400)
RBC: 3.07 MIL/uL — ABNORMAL LOW (ref 4.22–5.81)
RDW: 20.6 % — ABNORMAL HIGH (ref 11.5–15.5)
WBC: 8.6 10*3/uL (ref 4.0–10.5)
nRBC: 13.1 % — ABNORMAL HIGH (ref 0.0–0.2)

## 2020-01-18 LAB — BASIC METABOLIC PANEL
Anion gap: 7 (ref 5–15)
BUN: 9 mg/dL (ref 8–23)
CO2: 25 mmol/L (ref 22–32)
Calcium: 8.1 mg/dL — ABNORMAL LOW (ref 8.9–10.3)
Chloride: 107 mmol/L (ref 98–111)
Creatinine, Ser: 0.64 mg/dL (ref 0.61–1.24)
GFR, Estimated: >60 mL/min (ref >60)
Glucose, Bld: 97 mg/dL (ref 70–99)
Potassium: 4 mmol/L (ref 3.5–5.1)
Sodium: 139 mmol/L (ref 135–145)

## 2020-01-18 LAB — MAGNESIUM: Magnesium: 2.4 mg/dL (ref 1.7–2.4)

## 2020-01-18 SURGERY — ESOPHAGOGASTRODUODENOSCOPY (EGD) WITH PROPOFOL
Anesthesia: Monitor Anesthesia Care

## 2020-01-18 MED ORDER — LACTATED RINGERS IV SOLN: INTRAVENOUS | Status: DC | PRN

## 2020-01-18 MED ORDER — PROPOFOL 500 MG/50ML IV EMUL
INTRAVENOUS | Status: DC | PRN
  Administered 2020-01-18: 80 ug/kg/min via INTRAVENOUS

## 2020-01-18 MED ORDER — PROPOFOL 10 MG/ML IV BOLUS
INTRAVENOUS | Status: DC | PRN
  Administered 2020-01-18: 20 mg via INTRAVENOUS
  Administered 2020-01-18: 30 mg via INTRAVENOUS
  Administered 2020-01-18: 20 mg via INTRAVENOUS
  Administered 2020-01-18: 10 mg via INTRAVENOUS

## 2020-01-18 MED ORDER — ACETAMINOPHEN 325 MG PO TABS: 650.0000 mg | ORAL_TABLET | Freq: Four times a day (QID) | ORAL | Status: DC | PRN

## 2020-01-18 MED ORDER — TRAMADOL HCL 50 MG PO TABS
50.0000 mg | ORAL_TABLET | Freq: Four times a day (QID) | ORAL | Status: DC | PRN
  Administered 2020-01-18: 50 mg via ORAL
  Filled 2020-01-18: qty 1

## 2020-01-18 MED ORDER — LIDOCAINE HCL (CARDIAC) PF 100 MG/5ML IV SOSY
PREFILLED_SYRINGE | INTRAVENOUS | Status: DC | PRN
  Administered 2020-01-18: 40 mg via INTRAVENOUS

## 2020-01-18 SURGICAL SUPPLY — 15 items

## 2020-01-18 NOTE — Progress Notes (Signed)
[facility] Progress Note  Patient: [name]    BPZ:[id number]  DOA: 01/14/2020     Date of Service: the patient was seen and examined on 01/18/2020  Brief hospital course: 86 year old male with history of CVA and residual left-sided weakness, prediabetes, hyperlipidemia, chronic steroid use for sinusitis, combined systolic and diastolic CHF, recurrent lower GI bleed comes to the hospital with black stools.  Patient is on chronic Plavix for his CVA.  Unfortunately he was hospitalized about 6 times in the last year with recurrent GI bleed.  He underwent extensive work-up in the past with EGD, colonoscopy, bleeding scan. Hemoglobin on admission was in the 4 range, he was given PRBC. Currently plan is for monitor results of capsule endoscopy.  Assessment and Plan: Acute blood loss anemia secondary to recurrent GI bleed, melena On presentation hemoglobin 4.4. As before PRBC transfusion. GI consulted. SP EGD negative for any acute bleeding. Hemoglobin stable since 01/15/2020. Patient continues to have on and off melena. Off Plavix. Capsule endoscopy performed on 01/17/2020, unable to pass beyond stomach secondary to stenosis. Video capsule placed under anesthesia on 01/18/2020. Follow-up on the results. We will discuss with GI to see if initiation of 81 mg aspirin is tolerable for this patient.  History of CVA On chronic Plavix,  saw Dr. Leonie Man in September 2021, at that time recommendations were to continue Plavix.  His current hospitalization is the 6th one for GI bleed in the last year and second one in November alone.   Prior provider discussed with Dr. Erlinda Hong,   Chronic combined systolic and diastolic CHF Appears very euvolemic. Received IV Lasix following PRBC transfusion. Most recent 2D echo was done 3 years ago showed an EF of 45%.  Chronic steroid use for sinusitis Continue prednisone Placing the patient at high risk for recurrent gastritis.  Presyncopal episode  secondary to symptomatic anemia Currently asymptomatic. PT OT suggest home health.  Diet: Clear liquid DVT Prophylaxis:   SCDs Start: 01/14/20 1727   Advance goals of care discussion: DNR  Family Communication: family was present at bedside, at the time of interview.  The pt provided permission to discuss medical plan with the family. Opportunity was given to ask question and all questions were answered satisfactorily.   Disposition:  Status is: Inpatient  Remains inpatient appropriate because:Ongoing diagnostic testing needed not appropriate for outpatient work up and IV treatments appropriate due to intensity of illness or inability to take PO  Dispo: The patient is from: Home              Anticipated d/c is to: SNF              Anticipated d/c date is: 2 days              Patient currently is not medically stable to d/c.  Subjective: No acute complaint.  No nausea no vomiting.  No BM so far.  No abdominal pain.  Breathing okay.  No dizziness lightheadedness.  Physical Exam:  General: Appear in mild distress, NO Rash; Oral Mucosa Clear, moist. no Abnormal Neck Mass Or lumps, Conjunctiva normal  Cardiovascular: S1 and S2 Present, NO Murmur, Respiratory: good respiratory effort, Bilateral Air entry present and CTA, NO Crackles, NO wheezes Abdomen: Bowel Sound present, Soft and NO tenderness Extremities: NO Pedal edema Neurology: alert and oriented to time, place, and person affect appropriate. no new focal deficit Gait not checked due to patient safety concerns  Vitals:   01/18/20 0950 01/18/20 1000 01/18/20  1030 01/18/20 1318  BP: 94/60 105/63 128/65 (!) 113/54  Pulse: 69 70 64 71  Resp: (!) 21 (!) 23 19 20   Temp: 97.8 F (36.6 C)  97.8 F (36.6 C) 98 F (36.7 C)  TempSrc: Oral  Oral Oral  SpO2: 98% 96% 100% 99%  Weight:      Height:        Intake/Output Summary (Last 24 hours) at 01/18/2020 1636 Last data filed at 01/18/2020 1528 Gross per 24 hour  Intake 1360 ml   Output 1002 ml  Net 358 ml   Filed Weights   01/15/20 1347 01/18/20 0853 01/18/20 0918  Weight: 68.1 kg 68.1 kg 68.1 kg    Data Reviewed: I have personally reviewed and interpreted daily labs, tele strips, imagings as discussed above. I reviewed all nursing notes, pharmacy notes, vitals, pertinent old records I have discussed plan of care as described above with RN and patient/family.  CBC: Recent Labs  Lab 01/14/20 1355 01/14/20 1355 01/15/20 0431 01/15/20 2022 01/16/20 0538 01/17/20 0515 01/18/20 0447  WBC 13.4*  --  10.5  --  8.2 12.3* 8.6  HGB 4.4*   < > 5.9*  6.0* 8.4* 8.2* 8.5* 8.9*  HCT 15.0*   < > 18.7*  19.2* 26.1* 25.9* 26.7* 28.9*  MCV 100.7*  --  90.3  --  92.5 92.7 94.1  PLT 317  --  234  --  234 250 291   < > = values in this interval not displayed.   Basic Metabolic Panel: Recent Labs  Lab 01/14/20 1355 01/15/20 0431 01/16/20 0538 01/17/20 0515 01/18/20 0447  NA 133* 138 139 135 139  K 3.6 3.3* 3.5 3.5 4.0  CL 105 108 108 105 107  CO2 22 23 26 26 25   GLUCOSE 126* 131* 102* 97 97  BUN 47* 36* 22 14 9   CREATININE 0.85 0.75 0.77 0.73 0.64  CALCIUM 8.0* 7.4* 7.8* 7.9* 8.1*  MG  --   --   --   --  2.4    Studies: No results found.  Scheduled Meds: . budesonide  2 mL Nebulization BID  . ferrous [id number] C-folic acid  1 capsule Oral TID PC  . predniSONE  5 mg Oral Daily  . rOPINIRole  3 mg Oral QHS  . simvastatin  40 mg Oral QHS   Continuous Infusions: PRN Meds: acetaminophen, albuterol, fluticasone, montelukast, traMADol, zolpidem  Time spent: 35 minutes  Author: Berle Mull, MD [facility] 01/18/2020 4:36 PM  To reach On-call, see care teams to locate the attending and reach out via [email or web address]. Between 7PM-7AM, please contact night-coverage If you still have difficulty reaching the attending provider, please page the [facility] (Director on Call) for [facility] on amion for assistance.

## 2020-01-18 NOTE — TOC Progression Note (Addendum)
Transition of Care [facility]) - Progression Note    Patient Details  Name: ENDI LAGMAN MRN: [id number] Date of Birth: [birth year]  Transition of Care [facility]) CM/SW Contact  Purcell Mouton, RN Phone Number: 01/18/2020, 11:21 AM  Clinical Narrative:     Pt will discharge to SNF  Expected Discharge Plan: [place] Barriers to Discharge: No Barriers Identified  Expected Discharge Plan and Services Expected Discharge Plan: [place] arrangements for the past 2 months: Apartment Expected Discharge Date:  (unknown)                                     Social Determinants of Health (SDOH) Interventions    Readmission Risk Interventions No flowsheet data found.

## 2020-01-18 NOTE — NC FL2 (Signed)
[place] LEVEL OF CARE SCREENING TOOL     IDENTIFICATION  Patient Name: [name] Birthdate: [birth year] Sex: male Admission Date (Current Location): 01/14/2020  [facility] and [place] Number:  Herbalist and Address:  [facility],  [place], [place]      Provider Number: [id number]  Attending Physician Name and Address:  Lavina Hamman, MD  Relative Name and Phone Number:  Narvel Kozub wife [phone]    Current Level of Care: Hospital Recommended Level of Care: [place] Prior Approval Number:    Date Approved/Denied:   PASRR Number: [id number]  Discharge Plan: SNF    Current Diagnoses: Patient Active Problem List   Diagnosis Date Noted  . Acute GI bleeding 01/06/2020  . Protein-calorie malnutrition, severe ([place]) 04/01/2019  . DNR (do not resuscitate) 04/01/2019  . Rectal bleed 08/21/2018  . Platelet inhibition due to Plavix   . Rectal bleeding   . Diverticulosis large intestine w/o perforation or abscess w/bleeding   . GERD (gastroesophageal reflux disease) 04/21/2018  . Hematochezia   . Hypokalemia 12/25/2017  . Diverticulosis   . Acute blood loss anemia 12/24/2017  . Syncope due to orthostatic hypotension 12/23/2017  . Chronic combined systolic and diastolic CHF (congestive heart failure) ([place]) 12/23/2017  . Postural dizziness with presyncope 10/25/2016  . GI bleed 10/25/2016  . BPPV (benign paroxysmal positional vertigo) 10/25/2016  . Lower GI bleed 10/24/2016  . Syncope 10/24/2016  . TIA (transient ischemic attack) 07/22/2016  . Benign essential HTN   . PAF (paroxysmal atrial fibrillation) ([place])   . Stroke-like symptom 02/22/2016  . Leukocytosis 02/22/2016  . Acute lower GI bleeding 02/01/2016  . History of lower GI bleeding Dec 2017 02/01/2016  . Chronic anticoagulation 2/2 history of CVA 01/31/2016  . History of CVA (cerebrovascular accident) 01/31/2016  . Sinusitis, chronic  12/12/2015  . Cough variant asthma 10/31/2015  . Insomnia 07/25/2015  . HLD (hyperlipidemia) 02/03/2014  . Ulnar neuropathy at elbow of right upper extremity 02/03/2014  . Cervical radiculopathy 02/03/2014  . Seizures ([place])   . Diabetes mellitus type II, non insulin dependent ([place]) 12/22/2013  . Recurrent ventral hernia 12/15/2011  . History of esophageal strciture   . Diverticulosis of colon with hemorrhage 06/24/2007    Orientation RESPIRATION BLADDER Height & Weight     Self, Time, Situation, Place  Normal Continent Weight: 68.1 kg Height:  5\' 9"  (175.3 cm)  BEHAVIORAL SYMPTOMS/MOOD NEUROLOGICAL BOWEL NUTRITION STATUS      Continent Diet (Heart Healthy)  AMBULATORY STATUS COMMUNICATION OF NEEDS Skin   Extensive Assist Verbally Other (Comment) (Ecchymosis)                       Personal Care Assistance Level of Assistance  Bathing, Feeding, Dressing Bathing Assistance: Limited assistance Feeding assistance: Independent Dressing Assistance: Limited assistance     Functional Limitations Info  Sight, Hearing, Speech Sight Info: Impaired Hearing Info: Adequate Speech Info: Adequate    SPECIAL CARE FACTORS FREQUENCY  PT (By licensed PT), OT (By licensed OT)     PT Frequency: 5x week OT Frequency: 5x week            Contractures Contractures Info: Not present    Additional Factors Info  Code Status, Allergies Code Status Info: DNR Allergies Info: Tape, Azithromycin           Current Medications (01/18/2020):  This is the current hospital active medication list Current  Facility-Administered Medications  Medication Dose Route Frequency Provider Last Rate Last Admin  . [MAR Hold] albuterol (VENTOLIN HFA) 108 (90 Base) MCG/ACT inhaler 2 puff  2 puff Inhalation Q6H PRN Harold Hedge, MD      . Doug Sou Hold] budesonide (PULMICORT) nebulizer solution 0.5 mg  2 mL Nebulization BID Harold Hedge, MD   0.5 mg at 01/17/20 2025  . [MAR Hold] ferrous  [id number] C-folic acid (TRINSICON / FOLTRIN) capsule 1 capsule  1 capsule Oral TID PC Lavina Hamman, MD      . Doug Sou Hold] fluticasone (FLONASE) 50 MCG/ACT nasal spray 1 spray  1 spray Each Nare Daily PRN Harold Hedge, MD      . Doug Sou Hold] montelukast (SINGULAIR) tablet 10 mg  10 mg Oral QHS PRN Harold Hedge, MD      . Doug Sou Hold] predniSONE (DELTASONE) tablet 5 mg  5 mg Oral Daily Harold Hedge, MD   5 mg at 01/17/20 0946  . [MAR Hold] rOPINIRole (REQUIP) tablet 3 mg  3 mg Oral QHS Lang Snow, FNP   3 mg at 01/17/20 2109  . [MAR Hold] simvastatin (ZOCOR) tablet 40 mg  40 mg Oral QHS Harold Hedge, MD   40 mg at 01/17/20 2109  . [MAR Hold] zolpidem (AMBIEN) tablet 5 mg  5 mg Oral QHS PRN Caren Griffins, MD   5 mg at 01/17/20 2109     Discharge Medications: Please see discharge summary for a list of discharge medications.  Relevant Imaging Results:  Relevant Lab Results:   Additional Information SS#[id number]  Purcell Mouton, RN

## 2020-01-18 NOTE — Anesthesia Postprocedure Evaluation (Signed)
Anesthesia Post Note  Patient: [name]  Procedure(s) Performed: ESOPHAGOGASTRODUODENOSCOPY (EGD) WITH PROPOFOL (N/A ) GIVENS CAPSULE STUDY (N/A )     Patient location during evaluation: Endoscopy Anesthesia Type: MAC Level of consciousness: awake and alert Pain management: pain level controlled Vital Signs Assessment: post-procedure vital signs reviewed and stable Respiratory status: spontaneous breathing, nonlabored ventilation, respiratory function stable and patient connected to nasal cannula oxygen Cardiovascular status: blood pressure returned to baseline and stable Postop Assessment: no apparent nausea or vomiting Anesthetic complications: no   No complications documented.  Last Vitals:  Vitals:   01/18/20 0946 01/18/20 0950  BP: 100/60 94/60  Pulse: 68 69  Resp: 20 (!) 21  Temp:  36.6 C  SpO2: 100% 98%    Last Pain:  Vitals:   01/18/20 0950  TempSrc: Oral  PainSc: 0-No pain                 Barnet Glasgow

## 2020-01-18 NOTE — TOC Progression Note (Signed)
Transition of Care [facility]) - Progression Note    Patient Details  Name: [name] MRN: [id number] Date of Birth: 1931-09-06  Transition of Care [facility]) CM/SW Contact  Purcell Mouton, RN Phone Number: 01/18/2020, 10:26 AM  Clinical Narrative:     Spoke with pt and wife at bedside concerning discharge plans. Both agreed to [facility]. A call was made to Admission Coordinator at [facility] who offered a bed to pt.        Expected Discharge Plan and Services           Expected Discharge Date:  (unknown)                                     Social Determinants of Health (SDOH) Interventions    Readmission Risk Interventions No flowsheet data found.

## 2020-01-18 NOTE — Interval H&P Note (Signed)
History and Physical Interval Note:  01/18/2020 9:04 AM  [name]  has presented today for surgery, with the diagnosis of GI bleeding.  The various methods of treatment have been discussed with the patient and family. After consideration of risks, benefits and other options for treatment, the patient has consented to  Procedure(s) with comments: ESOPHAGOGASTRODUODENOSCOPY (EGD) WITH PROPOFOL (N/A) - For placement of video capsule study as a surgical intervention.  The patient's history has been reviewed, patient examined, no change in status, stable for surgery.  I have reviewed the patient's chart and labs.  Questions were answered to the patient's satisfaction.    Case discussed with Dr. Rush Landmark.  Patient known to me from prior hospitalizations. Plan is for EGD with video capsule placement into small bowel. Hgb stable at 8.6 today - patient off plavix.  [name]

## 2020-01-18 NOTE — Plan of Care (Signed)

## 2020-01-18 NOTE — Transfer of Care (Signed)
Immediate Anesthesia Transfer of Care Note  Patient: BRAIDON CHERMAK  Procedure(s) Performed: ESOPHAGOGASTRODUODENOSCOPY (EGD) WITH PROPOFOL (N/A ) GIVENS CAPSULE STUDY (N/A )  Patient Location: Endoscopy Unit  Anesthesia Type:MAC  Level of Consciousness: drowsy and patient cooperative  Airway & Oxygen Therapy: Patient Spontanous Breathing and Patient connected to face mask oxygen  Post-op Assessment: Report given to RN and Post -op Vital signs reviewed and stable  Post vital signs: Reviewed and stable  Last Vitals:  Vitals Value Taken Time  BP 100/60   Temp    Pulse 70   Resp    SpO2 99% 0945    Last Pain:  Vitals:   01/18/20 0853  TempSrc: Oral  PainSc: 0-No pain      Patients Stated Pain Goal: 0 (34/74/25 9563)  Complications: No complications documented.

## 2020-01-18 NOTE — Progress Notes (Signed)
Pt returned from ENDO, specific instructions reviewed for diet and activity post ENDO procedure. Pt resting denies abd pain, c/o of knee discomfort, MD rounding and ordered pain meds. Spouse at bedside with pt. Will continue to monitor. SRP, RN.

## 2020-01-18 NOTE — Op Note (Addendum)
[facility] Patient Name: [name] Procedure Date: 01/18/2020 MRN: [id number] Attending MD: Estill Cotta. Danis , MD Date of Birth: 1932-02-01 CSN: [id number] Age: 86 Admit Type: Inpatient Procedure:                Upper GI endoscopy Indications:              Acute post hemorrhagic anemia, Melena (Enteroscopy                            2 days ago - VCE ingested yesterday but retained in                            [facility] and stomach 10 hours)                           This EGD is for placement of VCE. Providers:                Estill Cotta. Loletha Carrow, MD, Josie Dixon, RN, Jeanella Cara, RN Referring MD:             [facility] Medicines:                Monitored Anesthesia Care Complications:            No immediate complications. Estimated Blood Loss:     Estimated blood loss: none. Procedure:                Pre-Anesthesia Assessment:                           - Prior to the procedure, a History and Physical                            was performed, and patient medications and                            allergies were reviewed. The patient's tolerance of                            previous anesthesia was also reviewed. The risks                            and benefits of the procedure and the sedation                            options and risks were discussed with the patient.                            All questions were answered, and informed consent                            was obtained. Prior Anticoagulants: The patient has  taken Plavix (clopidogrel), last dose was 3 days                            prior to procedure. ASA Grade Assessment: III - A                            patient with severe systemic disease. After                            reviewing the risks and benefits, the patient was                            deemed in satisfactory condition to undergo the                            procedure.                            After obtaining informed consent, the endoscope was                            passed under direct vision. Throughout the                            procedure, the patient's blood pressure, pulse, and                            oxygen saturations were monitored continuously. The                            GIF-H190 ([id number]) Olympus gastroscope was                            introduced through the mouth, and advanced to the                            second part of duodenum. The upper GI endoscopy was                            accomplished without difficulty. The patient                            tolerated the procedure well. Scope In: Scope Out: Findings:      A 5 cm hiatal hernia was present. The UES tone was increased, scope       passed with slow steasdy pressure. no stricture seen.      The stomach was normal.      The examined duodenum was normal. Using the endoscope, the video capsule       enteroscope was advanced into the second portion of the duodenum. Impression:               - 5 cm hiatal hernia.                           - Normal  stomach.                           - Normal examined duodenum.                           - Successful completion of the Video Capsule                            Enteroscope placement.                           - No specimens collected. Moderate Sedation:      MAC sedation used Recommendation:           - Return patient to hospital ward for ongoing care.                           - Clear liquid diet for 4 hours, then regular diet.                           - Continue present medications.                           - Review video capsule study tomorrow. Procedure Code(s):        --- Professional ---                           [phone], Esophagogastroduodenoscopy, flexible,                            transoral; diagnostic, including collection of                            specimen(s) by brushing or washing, when performed                             (separate procedure) Diagnosis Code(s):        --- Professional ---                           K44.9, Diaphragmatic hernia without obstruction or                            gangrene                           D62, Acute posthemorrhagic anemia                           K92.1, Melena (includes Hematochezia) CPT copyright 2019 American Medical Association. All rights reserved. The codes documented in this report are preliminary and upon coder review may  be revised to meet current compliance requirements. Greyson Riccardi L. Loletha Carrow, MD 01/18/2020 9:49:57 AM This report has been signed electronically. Number of Addenda: 0

## 2020-01-19 ENCOUNTER — Encounter (HOSPITAL_COMMUNITY): Payer: Self-pay | Admitting: Internal Medicine

## 2020-01-19 DIAGNOSIS — K5520 Angiodysplasia of colon without hemorrhage: Secondary | ICD-10-CM | POA: Diagnosis not present

## 2020-01-19 DIAGNOSIS — R933 Abnormal findings on diagnostic imaging of other parts of digestive tract: Secondary | ICD-10-CM | POA: Diagnosis not present

## 2020-01-19 DIAGNOSIS — K5521 Angiodysplasia of colon with hemorrhage: Secondary | ICD-10-CM

## 2020-01-19 LAB — CBC
HCT: 29.9 % — ABNORMAL LOW (ref 39.0–52.0)
Hemoglobin: 9.2 g/dL — ABNORMAL LOW (ref 13.0–17.0)
MCH: 29.2 pg (ref 26.0–34.0)
MCHC: 30.8 g/dL (ref 30.0–36.0)
MCV: 94.9 fL (ref 80.0–100.0)
Platelets: 273 10*3/uL (ref 150–400)
RBC: 3.15 MIL/uL — ABNORMAL LOW (ref 4.22–5.81)
RDW: 19.9 % — ABNORMAL HIGH (ref 11.5–15.5)
WBC: 7.4 10*3/uL (ref 4.0–10.5)
nRBC: 9.5 % — ABNORMAL HIGH (ref 0.0–0.2)

## 2020-01-19 LAB — BASIC METABOLIC PANEL
Anion gap: 8 (ref 5–15)
BUN: 11 mg/dL (ref 8–23)
CO2: 23 mmol/L (ref 22–32)
Calcium: 8 mg/dL — ABNORMAL LOW (ref 8.9–10.3)
Chloride: 106 mmol/L (ref 98–111)
Creatinine, Ser: 0.82 mg/dL (ref 0.61–1.24)
GFR, Estimated: >60 mL/min (ref >60)
Glucose, Bld: 100 mg/dL — ABNORMAL HIGH (ref 70–99)
Potassium: 4.2 mmol/L (ref 3.5–5.1)
Sodium: 137 mmol/L (ref 135–145)

## 2020-01-19 NOTE — TOC Progression Note (Signed)
Transition of Care [facility]) - Progression Note    Patient Details  Name: [name] MRN: [id number] Date of Birth: 12-Dec-1931  Transition of Care [facility]) CM/SW Contact  Purcell Mouton, RN Phone Number: 01/19/2020, 2:11 PM  Clinical Narrative:    Waiting for [facility] auth for [facility].    Expected Discharge Plan: [place] Barriers to Discharge: No Barriers Identified  Expected Discharge Plan and Services Expected Discharge Plan: [place]   Discharge Planning Services: CM Consult   Living arrangements for the past 2 months: Apartment Expected Discharge Date:  (unknown)                                     Social Determinants of Health (SDOH) Interventions    Readmission Risk Interventions No flowsheet data found.

## 2020-01-19 NOTE — Procedures (Signed)
VCE from 01/16/20:  Findings: The Video Capsule was noted to hang in the upper pharynx for about 2-minutes.  At which point it then proceeded into the esophagus.  Query a result of the known stricture s/p dilation just before the VCE was swallowed at the region of UES.  A mucosal wrent is noted in that region. The Video capsule endoscope showed evidence of erosions and mild blood within the stomach, which is most likely a result of the further dilation of the distal esophagus going into the known hiatal hernia. Gastritis was noted in the gastric body. VCE stayed in the stomach during entire recording time.  Summary and Recommendations: - Mucosal wrent s/p recent dilation of the esophagus - Hiatal hernia noted - Hematin/erosions noted in the hiatal hernia and stomach on exam and consistent with recent interventions noted in the SBE report - Gastritis noted in stomach and consistent with recent interventions noted in the [facility] report  Plan for repeat VCE with placement of it endoscopically on 12/2 (completed; see separate procedure notes)

## 2020-01-19 NOTE — Anesthesia Preprocedure Evaluation (Addendum)
Anesthesia Evaluation  Patient identified by MRN, date of birth, ID band Patient awake    Reviewed: Allergy & Precautions, NPO status , Patient's Chart, lab work & pertinent test results  Airway Mallampati: II  TM Distance: >3 FB Neck ROM: Full    Dental no notable dental hx. (+) Lower Dentures, Missing,    Pulmonary asthma ,    Pulmonary exam normal breath sounds clear to auscultation       Cardiovascular hypertension, Pt. on medications +CHF  Normal cardiovascular exam Rhythm:Regular Rate:Normal  2018 ECHO: Left ventricle: The cavity size was normal. Wall thickness was  normal. Systolic function was mildly to moderately reduced. The  estimated ejection fraction was 45%. Doppler parameters are  consistent with abnormal left ventricular relaxation (grade 1  diastolic dysfunction).   Neuro/Psych Seizures -,  TIA Neuromuscular disease CVA negative psych ROS   GI/Hepatic Neg liver ROS, hiatal hernia, GERD  ,  Endo/Other  diabetes  Renal/GU negative Renal ROS  negative genitourinary   Musculoskeletal  (+) Arthritis ,   Abdominal   Peds  Hematology  (+) anemia ,   Anesthesia Other Findings   Reproductive/Obstetrics negative OB ROS                            Anesthesia Physical  Anesthesia Plan  ASA: III and emergent  Anesthesia Plan: MAC   Post-op Pain Management:    Induction: Intravenous  PONV Risk Score and Plan: 1 and Treatment may vary due to age or medical condition, Propofol infusion and TIVA  Airway Management Planned: Natural Airway and Nasal Cannula  Additional Equipment:   Intra-op Plan:   Post-operative Plan:   Informed Consent: I have reviewed the patients History and Physical, chart, labs and discussed the procedure including the risks, benefits and alternatives for the proposed anesthesia with the patient or authorized representative who has indicated  his/her understanding and acceptance.       Plan Discussed with: CRNA, Anesthesiologist and Surgeon  Anesthesia Plan Comments:        Anesthesia Quick Evaluation

## 2020-01-19 NOTE — Progress Notes (Signed)
[facility] Progress Note  Patient: [name]    XBD:[id number]  DOA: 01/14/2020     Date of Service: the patient was seen and examined on 01/19/2020  Brief hospital course: 86 year old male with history of CVA and residual left-sided weakness, prediabetes, hyperlipidemia, chronic steroid use for sinusitis, combined systolic and diastolic CHF, recurrent lower GI bleed comes to the hospital with black stools.  Patient is on chronic Plavix for his CVA.  Unfortunately he was hospitalized about 6 times in the last year with recurrent GI bleed.  He underwent extensive work-up in the past with EGD, colonoscopy, bleeding scan. Hemoglobin on admission was in the 4 range, he was given PRBC. Currently plan is for push enteroscopy tomorrow.  Assessment and Plan: Acute blood loss anemia secondary to recurrent GI bleed, melena On presentation hemoglobin 4.4. As before PRBC transfusion. GI consulted. SP EGD negative for any acute bleeding. Hemoglobin stable since 01/15/2020. Patient continues to have on and off melena. Off Plavix. Capsule endoscopy performed on 01/17/2020, unable to pass beyond stomach secondary to stenosis. Video capsule placed under anesthesia on 01/18/2020. Results shows AVM. GI will attempt another push enteroscopy tomorrow to treat the AVMs. If GI unable to treat the AVM patient will require double-balloon enteroscopy outpatient. Timing of initiation of antiplatelet medication depending on procedure tomorrow. Appreciate GI assistance.  History of CVA On chronic Plavix,  saw Dr. Leonie Man in September 2021, at that time recommendations were to continue Plavix.  His current hospitalization is the 6th one for GI bleed in the last year and second one in November alone.   Prior provider discussed with Dr. Erlinda Hong.   Chronic combined systolic and diastolic CHF Appears very euvolemic. Received IV Lasix following PRBC transfusion. Most recent 2D echo was done 3 years ago showed an  EF of 45%.  Chronic steroid use for sinusitis Continue prednisone Placing the patient at high risk for recurrent gastritis.  Presyncopal episode secondary to symptomatic anemia Currently asymptomatic. Plan to go to SNF post discharge. Will need Covid test.  Diet: Clear liquid DVT Prophylaxis:   SCDs Start: 01/14/20 1727   Advance goals of care discussion: DNR  Family Communication: family was present at bedside, at the time of interview.  The pt provided permission to discuss medical plan with the family. Opportunity was given to ask question and all questions were answered satisfactorily.   Disposition:  Status is: Inpatient  Remains inpatient appropriate because:Ongoing diagnostic testing needed not appropriate for outpatient work up and IV treatments appropriate due to intensity of illness or inability to take PO  Dispo: The patient is from: Home              Anticipated d/c is to: SNF              Anticipated d/c date is: 2 days              Patient currently is not medically stable to d/c.  Subjective: No acute complaint no nausea no vomiting but no fever no chills.  Physical Exam:  General: Appear in mild distress, NO Rash; Oral Mucosa Clear, moist. no Abnormal Neck Mass Or lumps, Conjunctiva normal  Cardiovascular: S1 and S2 Present, NO Murmur, Respiratory: good respiratory effort, Bilateral Air entry present and CTA, NO Crackles, NO wheezes Abdomen: Bowel Sound present, Soft and NO tenderness Extremities: NO Pedal edema Neurology: alert and oriented to time, place, and person affect appropriate. no new focal deficit Gait not checked due to patient safety  concerns  Vitals:   01/18/20 2035 01/19/20 0611 01/19/20 0820 01/19/20 1317  BP: 120/60 117/69  117/73  Pulse: 79 80  (!) 106  Resp: 20 16  16   Temp: 98.1 F (36.7 C) 98.3 F (36.8 C)  98.5 F (36.9 C)  TempSrc: Oral Oral  Oral  SpO2: 97% 94% 98% 99%  Weight:      Height:        Intake/Output  Summary (Last 24 hours) at 01/19/2020 1935 Last data filed at 01/19/2020 1434 Gross per 24 hour  Intake 840 ml  Output 1 ml  Net 839 ml   Filed Weights   01/15/20 1347 01/18/20 0853 01/18/20 0918  Weight: 68.1 kg 68.1 kg 68.1 kg    Data Reviewed: I have personally reviewed and interpreted daily labs, tele strips, imagings as discussed above. I reviewed all nursing notes, pharmacy notes, vitals, pertinent old records I have discussed plan of care as described above with RN and patient/family.  CBC: Recent Labs  Lab 01/15/20 0431 01/15/20 0431 01/15/20 2022 01/16/20 0538 01/17/20 0515 01/18/20 0447 01/19/20 0434  WBC 10.5  --   --  8.2 12.3* 8.6 7.4  HGB 5.9*   6.0*   < > 8.4* 8.2* 8.5* 8.9* 9.2*  HCT 18.7*   19.2*   < > 26.1* 25.9* 26.7* 28.9* 29.9*  MCV 90.3  --   --  92.5 92.7 94.1 94.9  PLT 234  --   --  234 250 291 273   < > = values in this interval not displayed.   Basic Metabolic Panel: Recent Labs  Lab 01/15/20 0431 01/16/20 0538 01/17/20 0515 01/18/20 0447 01/19/20 0434  NA 138 139 135 139 137  K 3.3* 3.5 3.5 4.0 4.2  CL 108 108 105 107 106  CO2 23 26 26 25 23   GLUCOSE 131* 102* 97 97 100*  BUN 36* 22 14 9 11   CREATININE 0.75 0.77 0.73 0.64 0.82  CALCIUM 7.4* 7.8* 7.9* 8.1* 8.0*  MG  --   --   --  2.4  --     Studies: No results found.  Scheduled Meds:  budesonide  2 mL Nebulization BID   ferrous [id number] C-folic acid  1 capsule Oral TID PC   predniSONE  5 mg Oral Daily   rOPINIRole  3 mg Oral QHS   simvastatin  40 mg Oral QHS   Continuous Infusions: PRN Meds: acetaminophen, albuterol, fluticasone, montelukast, traMADol, zolpidem  Time spent: 35 minutes  Author: Berle Mull, MD [facility] 01/19/2020 7:35 PM  To reach On-call, see care teams to locate the attending and reach out via [email or web address]. Between 7PM-7AM, please contact night-coverage If you still have difficulty reaching the attending provider, please  page the [facility] (Director on Call) for [facility] on amion for assistance.

## 2020-01-19 NOTE — Plan of Care (Signed)
POC discussed with pt, pt alert and oriented x4, shows, no signs of distress, VSS. Bed wheels locked, nonskid footwear applied OOB, call bell within reach, encouraged to use call bell for assistance, no falls during shift  Problem: Education: Goal: Knowledge of General Education information will improve Description: Including pain rating scale, medication(s)/side effects and non-pharmacologic comfort measures Outcome: Progressing   Problem: Health Behavior/Discharge Planning: Goal: Ability to manage health-related needs will improve Outcome: Progressing   Problem: Clinical Measurements: Goal: Ability to maintain clinical measurements within normal limits will improve Outcome: Progressing Goal: Will remain free from infection Outcome: Progressing Goal: Diagnostic test results will improve Outcome: Progressing Goal: Respiratory complications will improve Outcome: Progressing Goal: Cardiovascular complication will be avoided Outcome: Progressing   Problem: Activity: Goal: Risk for activity intolerance will decrease Outcome: Progressing   Problem: Nutrition: Goal: Adequate nutrition will be maintained Outcome: Progressing   Problem: Coping: Goal: Level of anxiety will decrease Outcome: Progressing   Problem: Elimination: Goal: Will not experience complications related to bowel motility Outcome: Progressing Goal: Will not experience complications related to urinary retention Outcome: Progressing   Problem: Pain Managment: Goal: General experience of comfort will improve Outcome: Progressing   Problem: Safety: Goal: Ability to remain free from injury will improve Outcome: Progressing   Problem: Skin Integrity: Goal: Risk for impaired skin integrity will decrease Outcome: Progressing

## 2020-01-19 NOTE — Progress Notes (Signed)
    Progress Note   Subjective  Chief Complaint: Acute blood loss anemia  Today, the patient explains that he is feeling well.  He denies any further acute blood loss.   Objective   Vital signs in last 24 hours: Temp:  [98 F (36.7 C)-98.3 F (36.8 C)] 98.3 F (36.8 C) (12/03 0611) Pulse Rate:  [71-80] 80 (12/03 0611) Resp:  [16-20] 16 (12/03 0611) BP: (113-120)/(54-69) 117/69 (12/03 9643) SpO2:  [94 %-99 %] 98 % (12/03 0820) Last BM Date: (P) 01/19/20 General:    Elderly white male in NAD Heart:  Regular rate and rhythm; no murmurs Lungs: Respirations even and unlabored, lungs CTA bilaterally Abdomen:  Soft, nontender and nondistended. Normal bowel sounds. Psych:  Cooperative. Normal mood and affect.  Intake/Output from previous day: 12/02 0701 - 12/03 0700 In: 880 [P.O.:480; I.V.:400] Out: 2 [Urine:1; Stool:1] Intake/Output this shift: Total I/O In: 480 [P.O.:480] Out: -   Lab Results: Recent Labs    01/17/20 0515 01/18/20 0447 01/19/20 0434  WBC 12.3* 8.6 7.4  HGB 8.5* 8.9* 9.2*  HCT 26.7* 28.9* 29.9*  PLT 250 291 273   BMET Recent Labs    01/17/20 0515 01/18/20 0447 01/19/20 0434  NA 135 139 137  K 3.5 4.0 4.2  CL 105 107 106  CO2 26 25 23   GLUCOSE 97 97 100*  BUN 14 9 11   CREATININE 0.73 0.64 0.82  CALCIUM 7.9* 8.1* 8.0*   LFT Recent Labs    01/17/20 0515  PROT 4.5*  ALBUMIN 2.8*  AST 17  ALT 13  ALKPHOS 45  BILITOT 0.9    Assessment / Plan:   Assessment: 1.  Iron deficiency anemia: Patient now status post enteroscopy and pill capsule endoscopy, AVMs in the small bowel  Plan: 1.  Continue to monitor hemoglobin with transfusion as needed less than 7 2.  Plans for 1 more repeat enteroscopy tomorrow with Dr. Rush Landmark to see if we can treat some of the lesions seen at time pill capsule endoscopy. 3.  Pending results from above patient may be discharged with continual follow-up of hemoglobin and possible IV iron infusions scheduled out  and possible follow-up outpatient at [facility] for double-balloon enteroscopy. 4.  Also discussion had with hospitalist team about giving the patient just on aspirin which would help decrease his risk of bleeding. 5.  Please await any further recommendations from Dr. Rush Landmark later today.  Thank you for kind consultation, we will continue to follow.   LOS: 5 days   Levin Erp  01/19/2020, 11:22 AM

## 2020-01-19 NOTE — H&P (View-Only) (Signed)
[facility] Telephone Encounter  Patient's wife requested a call, called and spoke with her at 3:50pm in regards to the Enteroscopy tomorrow and what the indications were.  Also discussed plans post procedure and the fact that hopefully Mr. Mcloud will be discharged tomorrow after time of the procedure.  I spent time answering all of her questions.  She would like a call from Dr. Rush Landmark after time of procedure tomorrow as she will likely not be here that early in the morning.  Sincerely, Ellouise Newer, PA-C

## 2020-01-19 NOTE — Procedures (Signed)
Findings: 1) Complete study with adequate prep 2) A single site of active bleeding in the proximal small bowel, approximately 4 minutes after VCE delivery and 7 minutes proximal to the likely tattoo site 3) A few scattered probable non-bleeding AVMs in the proximal and mid small bowel, and a few other scattered non-specific "red spots" in the mid  small bowel. the majority are located distal to the tattoo site 4) Benign appearing lymphangiectasia  Impression and Recommendations: The small AVM with active bleeding should be within reach of a push enteroscopy. The remainder of  the probable AVMs and other non-specific "red spots" and possible erosions are potentially within reach of device assisted enteroscopy.   - Push enteroscopy for therapeutic intent - NPO at MN. Clears ok today - If  continued bleeding after AVM treatment, may require referral to tertiary center for device-assisted enteroscopy - Cardiology consult to discuss if he needs ongoing antiplatelet therapy - Consider trial of Octreotide   Gerrit Heck, DO, [facility]

## 2020-01-19 NOTE — Progress Notes (Signed)
[facility] Telephone Encounter  Patient's wife requested a call, called and spoke with her at 3:50pm in regards to the Enteroscopy tomorrow and what the indications were.  Also discussed plans post procedure and the fact that hopefully Mr. Weber will be discharged tomorrow after time of the procedure.  I spent time answering all of her questions.  She would like a call from Dr. Rush Landmark after time of procedure tomorrow as she will likely not be here that early in the morning.  Sincerely, Ellouise Newer, PA-C

## 2020-01-19 NOTE — Progress Notes (Signed)
Physical Therapy Treatment Patient Details Name: [name] MRN: [id number] DOB: 1931-07-08 Today's Date: 01/19/2020    History of Present Illness 86 yo male admitted with acute blood loss anemia, recurrent GI bleed. hx of recurrent diverticular bleed, CVA, DM, OA, CHF. Recent d/c 11/22.    PT Comments    Pt tolerates ambulation this session, mild SOB noted with SpO2 100% on RA and pt reports "not much" difficulty with ambualtion. Pt able to perform BLE strengthening exercises with cues for form. Pt tolerates remaining up in chair at EOS with spouse in room. Both pt and spouse still concerned about pt needing strengthening and finding solution/cause of bleeding- notified RN. Pt will benefit from continued physical therapy in hospital and recommendations below to increase strength, balance, endurance for safe ADLs and gait.     Follow Up Recommendations  SNF (wife is still concerned about pt's weakness and being able to care for him at home)     Equipment Recommendations  None recommended by PT    Recommendations for Other Services       Precautions / Restrictions Precautions Precautions: Fall Required Braces or Orthoses: Other Brace Other Brace: L knee brace and L AFO Restrictions Weight Bearing Restrictions: No    Mobility  Bed Mobility Overal bed mobility: Needs Assistance Bed Mobility: Supine to Sit  General bed mobility comments: slightly increased time with light bedrail use to come to EOB  Transfers Overall transfer level: Needs assistance Equipment used: Rolling walker (2 wheeled) Transfers: Sit to/from Stand Sit to Stand: Supervision    General transfer comment: BUE assisting to power up, SUPV for safety  Ambulation/Gait Ambulation/Gait assistance: Supervision Gait Distance (Feet): 200 Feet Assistive device: Rolling walker (2 wheeled) Gait Pattern/deviations: Step-through pattern;Decreased stride length;Trunk flexed Gait velocity: decreased   General  Gait Details: trunk flexed forward improving wtih cues, L knee hyperextension in SLS phase, denies lightheadedness or dizziness, L AFO providing L foot dorsiflexion   Stairs             Wheelchair Mobility    Modified Rankin (Stroke Patients Only)       Balance Overall balance assessment: Needs assistance Sitting-balance support: Feet supported Sitting balance-Leahy Scale: Good Sitting balance - Comments: seated EOB   Standing balance support: Bilateral upper extremity supported;During functional activity Standing balance-Leahy Scale: Fair Standing balance comment: able to stand statically without UE support         Cognition Arousal/Alertness: Awake/alert Behavior During Therapy: WFL for tasks assessed/performed Overall Cognitive Status: Within Functional Limits for tasks assessed         Exercises General Exercises - Lower Extremity Quad Sets: Seated;Strengthening;Both;10 reps Straight Leg Raises: Seated;Strengthening;Both;10 reps Other Exercises Other Exercises: STS, 10 reps from recliner, no RW, close SUPV    General Comments General comments (skin integrity, edema, etc.): SpO2 100% on RA      Pertinent Vitals/Pain Pain Assessment: No/denies pain    Home Living                      Prior Function            PT Goals (current goals can now be found in the care plan section) Acute Rehab PT Goals Patient Stated Goal: To increase strength PT Goal Formulation: With patient/family Time For Goal Achievement: 01/30/20 Potential to Achieve Goals: Good Progress towards PT goals: Progressing toward goals    Frequency    Min 3X/week      PT Plan Current  plan remains appropriate    Co-evaluation              AM-PAC PT "6 Clicks" Mobility   Outcome Measure  Help needed turning from your back to your side while in a flat bed without using bedrails?: None Help needed moving from lying on your back to sitting on the side of a flat bed  without using bedrails?: None Help needed moving to and from a bed to a chair (including a wheelchair)?: None Help needed standing up from a chair using your arms (e.g., wheelchair or bedside chair)?: None Help needed to walk in hospital room?: A Little Help needed climbing 3-5 steps with a railing? : A Little 6 Click Score: 22    End of Session Equipment Utilized During Treatment: Gait belt Activity Tolerance: Patient tolerated treatment well Patient left: in chair;with call bell/phone within reach;with chair alarm set;with family/visitor present Nurse Communication: Mobility status PT Visit Diagnosis: Muscle weakness (generalized) (M62.81)     Time: 0677-0340 PT Time Calculation (min) (ACUTE ONLY): 28 min  Charges:  $Gait Training: 8-22 mins $Therapeutic Exercise: 8-22 mins                      Tori Elmer Merwin PT, DPT 01/19/20, 1:29 PM

## 2020-01-20 ENCOUNTER — Encounter (HOSPITAL_COMMUNITY)
Admission: EM | Disposition: A | Discharge status: Skilled Nursing Facility | Payer: Self-pay | Source: Skilled Nursing Facility | Attending: Internal Medicine

## 2020-01-20 ENCOUNTER — Inpatient Hospital Stay (HOSPITAL_COMMUNITY): Payer: Medicare Other | Admitting: Certified Registered Nurse Anesthetist

## 2020-01-20 ENCOUNTER — Encounter (HOSPITAL_COMMUNITY): Payer: Self-pay | Admitting: Internal Medicine

## 2020-01-20 DIAGNOSIS — K5520 Angiodysplasia of colon without hemorrhage: Secondary | ICD-10-CM | POA: Diagnosis not present

## 2020-01-20 DIAGNOSIS — R933 Abnormal findings on diagnostic imaging of other parts of digestive tract: Secondary | ICD-10-CM | POA: Diagnosis not present

## 2020-01-20 DIAGNOSIS — K3189 Other diseases of stomach and duodenum: Secondary | ICD-10-CM

## 2020-01-20 HISTORY — PX: SCLEROTHERAPY: SHX6841

## 2020-01-20 HISTORY — PX: ENTEROSCOPY: SHX5533

## 2020-01-20 HISTORY — PX: HOT HEMOSTASIS: SHX5433

## 2020-01-20 LAB — CBC
HCT: 30.7 % — ABNORMAL LOW (ref 39.0–52.0)
Hemoglobin: 9.6 g/dL — ABNORMAL LOW (ref 13.0–17.0)
MCH: 29.8 pg (ref 26.0–34.0)
MCHC: 31.3 g/dL (ref 30.0–36.0)
MCV: 95.3 fL (ref 80.0–100.0)
Platelets: 299 10*3/uL (ref 150–400)
RBC: 3.22 MIL/uL — ABNORMAL LOW (ref 4.22–5.81)
RDW: 19.5 % — ABNORMAL HIGH (ref 11.5–15.5)
WBC: 7.8 10*3/uL (ref 4.0–10.5)
nRBC: 2.6 % — ABNORMAL HIGH (ref 0.0–0.2)

## 2020-01-20 LAB — BASIC METABOLIC PANEL
Anion gap: 7 (ref 5–15)
BUN: 9 mg/dL (ref 8–23)
CO2: 26 mmol/L (ref 22–32)
Calcium: 8.2 mg/dL — ABNORMAL LOW (ref 8.9–10.3)
Chloride: 104 mmol/L (ref 98–111)
Creatinine, Ser: 0.75 mg/dL (ref 0.61–1.24)
GFR, Estimated: >60 mL/min (ref >60)
Glucose, Bld: 104 mg/dL — ABNORMAL HIGH (ref 70–99)
Potassium: 4 mmol/L (ref 3.5–5.1)
Sodium: 137 mmol/L (ref 135–145)

## 2020-01-20 LAB — GLUCOSE, CAPILLARY: Glucose-Capillary: 104 mg/dL — ABNORMAL HIGH (ref 70–99)

## 2020-01-20 LAB — PREPARE RBC (CROSSMATCH)

## 2020-01-20 SURGERY — ENTEROSCOPY
Anesthesia: Monitor Anesthesia Care

## 2020-01-20 MED ORDER — SPOT INK MARKER SYRINGE KIT
PACK | SUBMUCOSAL | Status: DC | PRN
  Administered 2020-01-20: 1 mL via SUBMUCOSAL

## 2020-01-20 MED ORDER — PROPOFOL 500 MG/50ML IV EMUL
INTRAVENOUS | Status: DC | PRN
  Administered 2020-01-20: 20 mg via INTRAVENOUS
  Administered 2020-01-20: 30 mg via INTRAVENOUS

## 2020-01-20 MED ORDER — PROPOFOL 500 MG/50ML IV EMUL
INTRAVENOUS | Status: DC | PRN
  Administered 2020-01-20: 125 ug/kg/min via INTRAVENOUS

## 2020-01-20 MED ORDER — PANTOPRAZOLE SODIUM 40 MG PO TBEC
40.0000 mg | DELAYED_RELEASE_TABLET | Freq: Two times a day (BID) | ORAL | Status: DC
  Administered 2020-01-20 – 2020-01-22 (×4): 40 mg via ORAL
  Filled 2020-01-20 (×4): qty 1

## 2020-01-20 MED ORDER — GLYCOPYRROLATE 0.2 MG/ML IJ SOLN
INTRAMUSCULAR | Status: DC | PRN
  Administered 2020-01-20 (×2): .1 mg via INTRAVENOUS

## 2020-01-20 MED ORDER — LACTATED RINGERS IV SOLN
INTRAVENOUS | Status: AC | PRN
  Administered 2020-01-20: 20 mL/h via INTRAVENOUS

## 2020-01-20 MED ORDER — SODIUM CHLORIDE 0.9 % IV SOLN: INTRAVENOUS | Status: DC

## 2020-01-20 NOTE — Op Note (Signed)
[facility] Patient Name: [name] Procedure Date: 01/20/2020 MRN: [id number] Attending MD: Justice Britain , MD Date of Birth: January 28, 1932 CSN: [id number] Age: 86 Admit Type: Inpatient Procedure:                Small bowel enteroscopy Indications:              Anemia, Iron deficiency anemia, Dark stools, Occult                            blood in stool Providers:                Justice Britain, MD, Elmer Ramp. Tilden Dome, RN,                            Lesia Sago, Technician, Tyrone Apple,                            Technician, Arnoldo Hooker, CRNA Referring MD:             Milus Banister, MD, [facility] Medicines:                Monitored Anesthesia Care Complications:            No immediate complications. Estimated Blood Loss:     Estimated blood loss was minimal. Procedure:                Pre-Anesthesia Assessment:                           - Prior to the procedure, a History and Physical                            was performed, and patient medications and                            allergies were reviewed. The patient's tolerance of                            previous anesthesia was also reviewed. The risks                            and benefits of the procedure and the sedation                            options and risks were discussed with the patient.                            All questions were answered, and informed consent                            was obtained. Prior Anticoagulants: The patient has                            taken Plavix (clopidogrel), last dose was 6 days  prior to procedure. ASA Grade Assessment: III - A                            patient with severe systemic disease. After                            reviewing the risks and benefits, the patient was                            deemed in satisfactory condition to undergo the                            procedure.                           After  obtaining informed consent, the endoscope was                            passed under direct vision. Throughout the                            procedure, the patient's blood pressure, pulse, and                            oxygen saturations were monitored continuously. The                            PCF-H190DL ([id number]) Olympus pediatric colonscope                            was introduced through the mouth and advanced to                            the proximal jejunum. The small bowel enteroscopy                            was accomplished without difficulty. The patient                            tolerated the procedure. Scope In: Scope Out: Findings:      The cricopharyngeus was moderately tortuous but easier to pass then       previous SBE.      A non-obstructing Schatzki ring was found at the gastroesophageal       junction.      Hiatal hernia present.      Patchy mildly erythematous mucosa without bleeding was found in the       entire examined stomach.      Two spots with no bleeding were found in the third portion of the       duodenum and in the fourth portion of the duodenum. Fulguration to       ablate the lesions to prevent possible bleeding by argon plasma (right       colon settings) was successful.      Two spots with no bleeding were found in the proximal jejunum.  Fulguration to ablate the lesions to prevent possible bleeding by argon       plasma (right colon settings) was successful.      A tattoo was seen in the proximal jejunum - previously placed. We were       able to traverse this region for at least another 15 cm on today's       examination.      Normal mucosa was found in the visualized jejunum otherwise. Area was       tattooed with an injection of Spot (carbon black) to demarcate distal       extent of today's procedure. Impression:               - Tortuous esophagus.                           - Non-obstructing Schatzki ring - improved compared                             to prior.                           - Hiatal hernia present.                           - Erythematous mucosa in the stomach - previously                            biopsied.                           - Two spots with no bleeding in the duodenum.                            Treated with argon plasma coagulation (APC).                           - Jejunal spots with no bleeding were found.                            Treated with argon plasma coagulation (APC).                           - A previously placed tattoo was seen in the                            jejunum - we traversed this region for at least                            another 15 cm.                           - Otherwise, normal mucosa was found in the                            visualized jejunum. Tattooed distal extent. Recommendation:           - The patient will  be observed post-procedure,                            until all discharge criteria are met.                           - Return patient to hospital ward for ongoing care.                           - Resume regular diet.                           - Trend Hgb/Hct.                           - Would not restart Plavix for at least 72 hours                            (but would be OK for aspirin restart as soon as                            tomorrow). Final decision as to whether to                            transition from Plavix to Aspirin vs off all                            anti-PLT therapy completely is deferred to the                            primary team and the Neurology team, though I                            suspect, as patient and wife have also spoken with                            me about that they are willing to proceed with                            aspirin use as a means of secondary prevention of                            further strokes.                           - PO Iron reinitiation in 1-week.                           -  He has received 2 IV Iron infusions within 1                            month, so no further IV Iron for at least another  month.                           - If patient has recurrent GI bleeding and                            transfusion dependent anemia in future, then would                            recommend referral to [facility] for consideration of                            Double Balloon Enteroscopy vs complete cessation of                            anti-PLT therapy (if he restarts any) vs repeat EGD                            with VCE placement as he has already shown that he                            cannot pass the VCE through stomach when swallowed.                            Certainly will be of risk for strokes but needs to                            be weighed.                           - GI will signoff at this time. We will work on                            arranging follow up in clinic as well as Iron                            indices and blood counts in the next 4-weeks. Can                            see PCP in next couple of weeks for repeat CBC                            earlier to ensure stability (especially if any                            anti-PLT therapy is initiated/re-initiated).                           - The findings and recommendations were discussed                            with the patient.                           -  The findings and recommendations were discussed                            with the patient's family.                           - The findings and recommendations were discussed                            with the referring physician. Procedure Code(s):        --- Professional ---                           [phone], Small intestinal endoscopy, enteroscopy                            beyond second portion of duodenum, not including                            ileum; with control of bleeding (eg, injection,                             bipolar cautery, unipolar cautery, laser, heater                            probe, stapler, plasma coagulator)                           [id number], Unlisted procedure, small intestine Diagnosis Code(s):        --- Professional ---                           Q39.9, Congenital malformation of esophagus,                            unspecified                           K22.2, Esophageal obstruction                           K31.89, Other diseases of stomach and duodenum                           K63.89, Other specified diseases of intestine                           D64.9, Anemia, unspecified                           D50.9, Iron deficiency anemia, unspecified                           K92.1, Melena (includes Hematochezia)                           R19.5, Other fecal abnormalities CPT copyright 2019 [employer]. All  rights reserved. The codes documented in this report are preliminary and upon coder review may  be revised to meet current compliance requirements. Justice Britain, MD 01/20/2020 10:56:59 AM Number of Addenda: 0

## 2020-01-20 NOTE — Plan of Care (Signed)
  Problem: Health Behavior/Discharge Planning: Goal: Ability to manage health-related needs will improve Outcome: Progressing   Problem: Clinical Measurements: Goal: Will remain free from infection Outcome: Progressing   Problem: Activity: Goal: Risk for activity intolerance will decrease Outcome: Progressing   Problem: Coping: Goal: Level of anxiety will decrease Outcome: Progressing   Problem: Pain Managment: Goal: General experience of comfort will improve Outcome: Progressing   Problem: Safety: Goal: Ability to remain free from injury will improve Outcome: Progressing   Problem: Skin Integrity: Goal: Risk for impaired skin integrity will decrease Outcome: Progressing

## 2020-01-20 NOTE — Progress Notes (Signed)
[facility] Progress Note  Patient: [name]    ZOX:[id number]  DOA: 01/14/2020     Date of Service: the patient was seen and examined on 01/20/2020  Brief hospital course: 86 year old male with history of CVA and residual left-sided weakness, prediabetes, hyperlipidemia, chronic steroid use for sinusitis, combined systolic and diastolic CHF, recurrent lower GI bleed comes to the hospital with black stools.  Patient is on chronic Plavix for his CVA.  Unfortunately he was hospitalized about 6 times in the last year with recurrent GI bleed.  He underwent extensive work-up in the past with EGD, colonoscopy, bleeding scan. Hemoglobin on admission was in the 4 range, he was given PRBC. Currently plan is for push enteroscopy tomorrow.  Assessment and Plan: Acute blood loss anemia secondary to recurrent GI bleed, melena On presentation hemoglobin 4.4. As before PRBC transfusion. GI consulted. SP EGD negative for any acute bleeding. Hemoglobin stable since 01/15/2020. Patient continues to have on and off melena. Off Plavix. Capsule endoscopy performed on 01/17/2020, unable to pass beyond stomach secondary to stenosis. Video capsule placed under anesthesia on 01/18/2020. Results shows AVM. Underwent push enteroscopy with ablation of 4 AVM. Appreciate GI recommendation. Okay to resume aspirin on 01/21/2020. Plavix if preferred need to be held for at least 72 hours. Family wants to transition to aspirin for now. Outpatient follow-up with neurology.  History of CVA On chronic Plavix,  saw Dr. Leonie Man in September 2021, at that time recommendations were to continue Plavix.  His current hospitalization is the 6th one for GI bleed in the last year and second one in November alone.   Prior provider discussed with Dr. Erlinda Hong. Outpatient follow-up with neurology to discuss further antiplatelet medications.  Chronic combined systolic and diastolic CHF Appears very euvolemic. Received IV Lasix  following PRBC transfusion. Most recent 2D echo was done 3 years ago showed an EF of 45%.  Chronic steroid use for sinusitis Continue prednisone Placing the patient at high risk for recurrent gastritis.  Presyncopal episode secondary to symptomatic anemia Currently asymptomatic. Plan to go to SNF post discharge. Will need Covid test.  Diet: Regular diet DVT Prophylaxis:   SCDs Start: 01/14/20 1727   Advance goals of care discussion: DNR  Family Communication: family was present at bedside, at the time of interview.  The pt provided permission to discuss medical plan with the family. Opportunity was given to ask question and all questions were answered satisfactorily.   Disposition:  Status is: Inpatient  Remains inpatient appropriate because:Ongoing diagnostic testing needed not appropriate for outpatient work up and IV treatments appropriate due to intensity of illness or inability to take PO  Dispo: The patient is from: Home              Anticipated d/c is to: SNF              Anticipated d/c date is: 2 days              Patient currently is not medically stable to d/c.  Subjective: no shortness of breath.  No nausea no vomiting.  BM yesterday.  Physical Exam:  General: Appear in mild distress, NO Rash; Oral Mucosa Clear, moist. no Abnormal Neck Mass Or lumps, Conjunctiva normal  Cardiovascular: S1 and S2 Present, NO Murmur, Respiratory: good respiratory effort, Bilateral Air entry present and CTA, NO Crackles, NO wheezes Abdomen: Bowel Sound present, Soft and NO tenderness Extremities: NO Pedal edema Neurology: alert and oriented to time, place, and person affect  appropriate. no new focal deficit Gait not checked due to patient safety concerns  Vitals:   01/20/20 1050 01/20/20 1055 01/20/20 1149 01/20/20 1816  BP: 115/67 112/70 (!) 125/58   Pulse: 75 74 72   Resp: 14 15 14    Temp:   97.8 F (36.6 C)   TempSrc:   Oral   SpO2: 97% 100% 98% 96%  Weight:       Height:        Intake/Output Summary (Last 24 hours) at 01/20/2020 1934 Last data filed at 01/20/2020 1453 Gross per 24 hour  Intake 720 ml  Output 610 ml  Net 110 ml   Filed Weights   01/15/20 1347 01/18/20 0853 01/18/20 0918  Weight: 68.1 kg 68.1 kg 68.1 kg    Data Reviewed: I have personally reviewed and interpreted daily labs, tele strips, imagings as discussed above. I reviewed all nursing notes, pharmacy notes, vitals, pertinent old records I have discussed plan of care as described above with RN and patient/family.  CBC: Recent Labs  Lab 01/16/20 0538 01/17/20 0515 01/18/20 0447 01/19/20 0434 01/20/20 0605  WBC 8.2 12.3* 8.6 7.4 7.8  HGB 8.2* 8.5* 8.9* 9.2* 9.6*  HCT 25.9* 26.7* 28.9* 29.9* 30.7*  MCV 92.5 92.7 94.1 94.9 95.3  PLT 234 250 291 273 233   Basic Metabolic Panel: Recent Labs  Lab 01/16/20 0538 01/17/20 0515 01/18/20 0447 01/19/20 0434 01/20/20 0605  NA 139 135 139 137 137  K 3.5 3.5 4.0 4.2 4.0  CL 108 105 107 106 104  CO2 26 26 25 23 26   GLUCOSE 102* 97 97 100* 104*  BUN 22 14 9 11 9   CREATININE 0.77 0.73 0.64 0.82 0.75  CALCIUM 7.8* 7.9* 8.1* 8.0* 8.2*  MG  --   --  2.4  --   --     Studies: No results found.  Scheduled Meds: . budesonide  2 mL Nebulization BID  . ferrous [id number] C-folic acid  1 capsule Oral TID PC  . pantoprazole  40 mg Oral BID AC  . predniSONE  5 mg Oral Daily  . rOPINIRole  3 mg Oral QHS  . simvastatin  40 mg Oral QHS   Continuous Infusions: PRN Meds: acetaminophen, albuterol, fluticasone, montelukast, traMADol, zolpidem  Time spent: 35 minutes  Author: Berle Mull, MD [facility] 01/20/2020 7:34 PM  To reach On-call, see care teams to locate the attending and reach out via [email or web address]. Between 7PM-7AM, please contact night-coverage If you still have difficulty reaching the attending provider, please page the [facility] (Director on Call) for [facility] on amion for assistance.

## 2020-01-20 NOTE — Interval H&P Note (Signed)
History and Physical Interval Note:  01/20/2020 7:33 AM  [name]  has presented today for surgery, with the diagnosis of IDA.  The various methods of treatment have been discussed with the patient and family. After consideration of risks, benefits and other options for treatment, the patient has consented to  Procedure(s): ENTEROSCOPY (N/A) as a surgical intervention.  The patient's history has been reviewed, patient examined, no change in status, stable for surgery.  I have reviewed the patient's chart and labs.  Questions were answered to the patient's satisfaction.     [employer]

## 2020-01-20 NOTE — Progress Notes (Signed)
Pt returned to room from procedure.

## 2020-01-20 NOTE — Transfer of Care (Signed)
Immediate Anesthesia Transfer of Care Note  Patient: [place]  Procedure(s) Performed: Procedure(s): ENTEROSCOPY (N/A) SCLEROTHERAPY HOT HEMOSTASIS (ARGON PLASMA COAGULATION/BICAP) (N/A)  Patient Location: PACU  Anesthesia Type:MAC  Level of Consciousness:  sedated, patient cooperative and responds to stimulation  Airway & Oxygen Therapy:Patient Spontanous Breathing and Patient connected to face mask oxgen  Post-op Assessment:  Report given to PACU RN and Post -op Vital signs reviewed and stable  Post vital signs:  Reviewed and stable  Last Vitals:  Vitals:   01/20/20 0538 01/20/20 0716  BP: 114/69 140/70  Pulse: 78 72  Resp: 16 16  Temp: 36.7 C 36.5 C  SpO2:  48%    Complications: No apparent anesthesia complications

## 2020-01-20 NOTE — Anesthesia Postprocedure Evaluation (Signed)
Anesthesia Post Note  Patient: [name]  Procedure(s) Performed: ENTEROSCOPY (N/A ) SCLEROTHERAPY HOT HEMOSTASIS (ARGON PLASMA COAGULATION/BICAP) (N/A )     Patient location during evaluation: PACU Anesthesia Type: MAC Level of consciousness: awake and alert Pain management: pain level controlled Vital Signs Assessment: post-procedure vital signs reviewed and stable Respiratory status: spontaneous breathing and respiratory function stable Cardiovascular status: stable Postop Assessment: no apparent nausea or vomiting Anesthetic complications: no   No complications documented.  Last Vitals:  Vitals:   01/20/20 1050 01/20/20 1055  BP: 115/67 112/70  Pulse: 75 74  Resp: 14 15  Temp:    SpO2: 97% 100%    Last Pain:  Vitals:   01/20/20 1038  TempSrc: Axillary  PainSc:                  Merlinda Frederick

## 2020-01-21 DIAGNOSIS — R933 Abnormal findings on diagnostic imaging of other parts of digestive tract: Secondary | ICD-10-CM | POA: Diagnosis not present

## 2020-01-21 DIAGNOSIS — K5520 Angiodysplasia of colon without hemorrhage: Secondary | ICD-10-CM | POA: Diagnosis not present

## 2020-01-21 LAB — BASIC METABOLIC PANEL
Anion gap: 9 (ref 5–15)
BUN: 12 mg/dL (ref 8–23)
CO2: 25 mmol/L (ref 22–32)
Calcium: 8.2 mg/dL — ABNORMAL LOW (ref 8.9–10.3)
Chloride: 104 mmol/L (ref 98–111)
Creatinine, Ser: 0.79 mg/dL (ref 0.61–1.24)
GFR, Estimated: >60 mL/min (ref >60)
Glucose, Bld: 101 mg/dL — ABNORMAL HIGH (ref 70–99)
Potassium: 3.9 mmol/L (ref 3.5–5.1)
Sodium: 138 mmol/L (ref 135–145)

## 2020-01-21 LAB — CBC
HCT: 31.9 % — ABNORMAL LOW (ref 39.0–52.0)
Hemoglobin: 9.7 g/dL — ABNORMAL LOW (ref 13.0–17.0)
MCH: 29 pg (ref 26.0–34.0)
MCHC: 30.4 g/dL (ref 30.0–36.0)
MCV: 95.2 fL (ref 80.0–100.0)
Platelets: 330 10*3/uL (ref 150–400)
RBC: 3.35 MIL/uL — ABNORMAL LOW (ref 4.22–5.81)
RDW: 19.3 % — ABNORMAL HIGH (ref 11.5–15.5)
WBC: 11.3 10*3/uL — ABNORMAL HIGH (ref 4.0–10.5)
nRBC: 1.1 % — ABNORMAL HIGH (ref 0.0–0.2)

## 2020-01-21 MED ORDER — ASPIRIN EC 81 MG PO TBEC
81.0000 mg | DELAYED_RELEASE_TABLET | Freq: Every day | ORAL | Status: DC
  Administered 2020-01-21 – 2020-01-22 (×2): 81 mg via ORAL
  Filled 2020-01-21 (×2): qty 1

## 2020-01-21 NOTE — Progress Notes (Signed)
[facility] Progress Note  Patient: [name]    VQQ:[id number]  DOA: 01/14/2020     Date of Service: the patient was seen and examined on 01/21/2020  Brief hospital course: 86 year old male with history of CVA and residual left-sided weakness, prediabetes, hyperlipidemia, chronic steroid use for sinusitis, combined systolic and diastolic CHF, recurrent lower GI bleed comes to the hospital with black stools.  Patient is on chronic Plavix for his CVA.  Unfortunately he was hospitalized about 6 times in the last year with recurrent GI bleed.  He underwent extensive work-up in the past with EGD, colonoscopy, bleeding scan. Hemoglobin on admission was in the 4 range, he was given PRBC. Currently plan is for SNF discharge tomorrow.  Medically stable.  Assessment and Plan: Acute blood loss anemia secondary to recurrent GI bleed, melena On presentation hemoglobin 4.4. As before PRBC transfusion. GI consulted. SP EGD negative for any acute bleeding. Hemoglobin stable since 01/15/2020. Patient continues to have on and off melena. Off Plavix. Capsule endoscopy performed on 01/17/2020, unable to pass beyond stomach secondary to stenosis. Video capsule placed under anesthesia on 01/18/2020. Results shows AVM. Underwent push enteroscopy with ablation of 4 AVM. Appreciate GI recommendation. Okay to resume aspirin on 01/21/2020. Plavix if preferred need to be held for at least 72 hours. Family wants to transition to aspirin for now. Outpatient follow-up with neurology.  History of CVA On chronic Plavix,  saw Dr. Leonie Man in September 2021, at that time recommendations were to continue Plavix.  His current hospitalization is the 6th one for GI bleed in the last year and second one in November alone.   Prior provider discussed with Dr. Erlinda Hong. Outpatient follow-up with neurology to discuss further antiplatelet medications.  Chronic combined systolic and diastolic CHF Appears very  euvolemic. Received IV Lasix following PRBC transfusion. Most recent 2D echo was done 3 years ago showed an EF of 45%.  Chronic steroid use for sinusitis Continue prednisone Placing the patient at high risk for recurrent gastritis.  Presyncopal episode secondary to symptomatic anemia Currently asymptomatic. Plan to go to SNF post discharge. Will need Covid test.  Diet: Regular diet DVT Prophylaxis:   SCDs Start: 01/14/20 1727   Advance goals of care discussion: DNR  Family Communication: family was present at bedside, at the time of interview.  The pt provided permission to discuss medical plan with the family. Opportunity was given to ask question and all questions were answered satisfactorily.   Disposition:  Status is: Inpatient  Remains inpatient appropriate because:Ongoing diagnostic testing needed not appropriate for outpatient work up and IV treatments appropriate due to intensity of illness or inability to take PO  Dispo: The patient is from: Home              Anticipated d/c is to: SNF              Anticipated d/c date is: 2 days              Patient currently is not medically stable to d/c.  Subjective: No acute complaint.  Anxious to go out of the hospital but understand that he needs rehab. No nausea no vomiting. Still has melena in the stool.  Physical Exam:  General: Appear in mild distress, NO Rash; Oral Mucosa Clear, moist. no Abnormal Neck Mass Or lumps, Conjunctiva normal  Cardiovascular: S1 and S2 Present, NO Murmur, Respiratory: good respiratory effort, Bilateral Air entry present and CTA, NO Crackles, NO wheezes Abdomen: Bowel Sound present,  Soft and NO tenderness Extremities: NO Pedal edema Neurology: alert and oriented to time, place, and person affect appropriate. no new focal deficit Gait not checked due to patient safety concerns  Vitals:   01/20/20 2018 01/21/20 0504 01/21/20 0904 01/21/20 1355  BP: 121/69 122/70  117/65  Pulse: 82 78  87   Resp: 16   16  Temp: 97.8 F (36.6 C) 97.9 F (36.6 C)  97.7 F (36.5 C)  TempSrc: Oral Oral  Oral  SpO2: 99% 95% 95% 98%  Weight:      Height:        Intake/Output Summary (Last 24 hours) at 01/21/2020 1924 Last data filed at 01/21/2020 1723 Gross per 24 hour  Intake 480 ml  Output 700 ml  Net -220 ml   Filed Weights   01/15/20 1347 01/18/20 0853 01/18/20 0918  Weight: 68.1 kg 68.1 kg 68.1 kg    Data Reviewed: I have personally reviewed and interpreted daily labs, tele strips, imagings as discussed above. I reviewed all nursing notes, pharmacy notes, vitals, pertinent old records I have discussed plan of care as described above with RN and patient/family.  CBC: Recent Labs  Lab 01/17/20 0515 01/18/20 0447 01/19/20 0434 01/20/20 0605 01/21/20 0448  WBC 12.3* 8.6 7.4 7.8 11.3*  HGB 8.5* 8.9* 9.2* 9.6* 9.7*  HCT 26.7* 28.9* 29.9* 30.7* 31.9*  MCV 92.7 94.1 94.9 95.3 95.2  PLT 250 291 273 299 875   Basic Metabolic Panel: Recent Labs  Lab 01/17/20 0515 01/18/20 0447 01/19/20 0434 01/20/20 0605 01/21/20 0448  NA 135 139 137 137 138  K 3.5 4.0 4.2 4.0 3.9  CL 105 107 106 104 104  CO2 26 25 23 26 25   GLUCOSE 97 97 100* 104* 101*  BUN 14 9 11 9 12   CREATININE 0.73 0.64 0.82 0.75 0.79  CALCIUM 7.9* 8.1* 8.0* 8.2* 8.2*  MG  --  2.4  --   --   --     Studies: No results found.  Scheduled Meds: . aspirin EC  81 mg Oral Daily  . budesonide  2 mL Nebulization BID  . ferrous [id number] C-folic acid  1 capsule Oral TID PC  . pantoprazole  40 mg Oral BID AC  . predniSONE  5 mg Oral Daily  . rOPINIRole  3 mg Oral QHS  . simvastatin  40 mg Oral QHS   Continuous Infusions: PRN Meds: acetaminophen, albuterol, fluticasone, montelukast, traMADol, zolpidem  Time spent: 35 minutes  Author: Berle Mull, MD [facility] 01/21/2020 7:24 PM  To reach On-call, see care teams to locate the attending and reach out via [email or web address]. Between 7PM-7AM,  please contact night-coverage If you still have difficulty reaching the attending provider, please page the [facility] (Director on Call) for [facility] on amion for assistance.

## 2020-01-21 NOTE — Plan of Care (Signed)
  Problem: Health Behavior/Discharge Planning: Goal: Ability to manage health-related needs will improve Outcome: Progressing   Problem: Clinical Measurements: Goal: Will remain free from infection Outcome: Progressing   Problem: Activity: Goal: Risk for activity intolerance will decrease Outcome: Progressing   Problem: Coping: Goal: Level of anxiety will decrease Outcome: Progressing   Problem: Pain Managment: Goal: General experience of comfort will improve Outcome: Progressing   Problem: Safety: Goal: Ability to remain free from injury will improve Outcome: Progressing   Problem: Skin Integrity: Goal: Risk for impaired skin integrity will decrease Outcome: Progressing

## 2020-01-22 DIAGNOSIS — D62 Acute posthemorrhagic anemia: Secondary | ICD-10-CM | POA: Diagnosis not present

## 2020-01-22 LAB — CBC WITH DIFFERENTIAL/PLATELET
Abs Immature Granulocytes: 0.04 10*3/uL (ref 0.00–0.07)
Basophils Absolute: 0.1 10*3/uL (ref 0.0–0.1)
Basophils Relative: 1 %
Eosinophils Absolute: 0.4 10*3/uL (ref 0.0–0.5)
Eosinophils Relative: 4 %
HCT: 31.4 % — ABNORMAL LOW (ref 39.0–52.0)
Hemoglobin: 9.7 g/dL — ABNORMAL LOW (ref 13.0–17.0)
Immature Granulocytes: 0 %
Lymphocytes Relative: 16 %
Lymphs Abs: 1.9 10*3/uL (ref 0.7–4.0)
MCH: 29.5 pg (ref 26.0–34.0)
MCHC: 30.9 g/dL (ref 30.0–36.0)
MCV: 95.4 fL (ref 80.0–100.0)
Monocytes Absolute: 1.3 10*3/uL — ABNORMAL HIGH (ref 0.1–1.0)
Monocytes Relative: 11 %
Neutro Abs: 8.3 10*3/uL — ABNORMAL HIGH (ref 1.7–7.7)
Neutrophils Relative %: 68 %
Platelets: 321 10*3/uL (ref 150–400)
RBC: 3.29 MIL/uL — ABNORMAL LOW (ref 4.22–5.81)
RDW: 19.1 % — ABNORMAL HIGH (ref 11.5–15.5)
WBC: 11.9 10*3/uL — ABNORMAL HIGH (ref 4.0–10.5)
nRBC: 0.5 % — ABNORMAL HIGH (ref 0.0–0.2)

## 2020-01-22 LAB — SARS CORONAVIRUS 2 BY RT PCR (HOSPITAL ORDER, PERFORMED IN ~~LOC~~ HOSPITAL LAB): SARS Coronavirus 2: NEGATIVE

## 2020-01-22 MED ORDER — TRAMADOL HCL 50 MG PO TABS: 50.0000 mg | ORAL_TABLET | Freq: Every day | ORAL | 0 refills | Status: DC | PRN

## 2020-01-22 MED ORDER — FE FUMARATE-B12-VIT C-FA-IFC PO CAPS
1.0000 | ORAL_CAPSULE | Freq: Three times a day (TID) | ORAL | 0 refills | Status: DC
Start: 2020-01-22 — End: 2020-03-19

## 2020-01-22 MED ORDER — PANTOPRAZOLE SODIUM 40 MG PO TBEC
40.0000 mg | DELAYED_RELEASE_TABLET | Freq: Two times a day (BID) | ORAL | 0 refills | Status: DC
Start: 2020-01-22 — End: 2020-03-19

## 2020-01-22 MED ORDER — ASPIRIN 81 MG PO TBEC
81.0000 mg | DELAYED_RELEASE_TABLET | Freq: Every day | ORAL | 11 refills | Status: DC
Start: 2020-01-23 — End: 2020-04-10

## 2020-01-22 NOTE — Progress Notes (Signed)
Physical Therapy Treatment Patient Details Name: [name] MRN: [id number] DOB: 1931-03-18 Today's Date: 01/22/2020    History of Present Illness 86 yo male admitted with acute blood loss anemia, recurrent GI bleed. hx of recurrent diverticular bleed, CVA, DM, OA, CHF. Recent d/c 11/22.    PT Comments    Pt received awake in chair with wife present. Both are very eager for him to walk before leaving today. He requires min A to don L AFO and L knee brace for efficiency. Min G sit<>stand for safety; no physical assist required. Min G in hallway, requiring VCs only to keep RW at safe distance and to correct posture; pt encouraged not to watch feet during ambulation and to safely watch for surroundings and obstacles. Pt completed ~1109ft with no c/o  SOB or increased fatigue. Pt assisted to don pants for D/C.  Follow Up Recommendations  SNF     Equipment Recommendations  None recommended by PT    Recommendations for Other Services       Precautions / Restrictions Precautions Precautions: Fall Required Braces or Orthoses: Other Brace Other Brace: L knee brace and L AFO Restrictions Weight Bearing Restrictions: No    Mobility  Bed Mobility                  Transfers Overall transfer level: Needs assistance Equipment used: Rolling walker (2 wheeled) Transfers: Sit to/from Stand Sit to Stand: Min guard         General transfer comment: Min G for safety. Pt demo's correct UE placement for safe standing.  Ambulation/Gait Ambulation/Gait assistance: Min guard Gait Distance (Feet): 110 Feet Assistive device: Rolling walker (2 wheeled) Gait Pattern/deviations: Step-through pattern;Decreased stride length;Trunk flexed Gait velocity: decreased   General Gait Details: Pt encouraged to stand tall and look ahead, not at the ground. No LOB noted. Cues to keep RW at safe distance   Stairs             Wheelchair Mobility    Modified Rankin (Stroke Patients  Only)       Balance                                            Cognition Arousal/Alertness: Awake/alert Behavior During Therapy: WFL for tasks assessed/performed Overall Cognitive Status: Within Functional Limits for tasks assessed                                        Exercises      General Comments        Pertinent Vitals/Pain Pain Assessment: No/denies pain    Home Living                      Prior Function            PT Goals (current goals can now be found in the care plan section) Acute Rehab PT Goals Patient Stated Goal: To increase strength PT Goal Formulation: With patient/family Time For Goal Achievement: 01/30/20 Potential to Achieve Goals: Good Progress towards PT goals: Progressing toward goals    Frequency    Min 3X/week      PT Plan Current plan remains appropriate    Co-evaluation  AM-PAC PT "6 Clicks" Mobility   Outcome Measure  Help needed turning from your back to your side while in a flat bed without using bedrails?: None Help needed moving from lying on your back to sitting on the side of a flat bed without using bedrails?: None Help needed moving to and from a bed to a chair (including a wheelchair)?: None Help needed standing up from a chair using your arms (e.g., wheelchair or bedside chair)?: None Help needed to walk in hospital room?: A Little Help needed climbing 3-5 steps with a railing? : A Little 6 Click Score: 22    End of Session Equipment Utilized During Treatment: Gait belt Activity Tolerance: Patient tolerated treatment well Patient left: in chair;with call bell/phone within reach;with chair alarm set;with family/visitor present Nurse Communication: Mobility status PT Visit Diagnosis: Muscle weakness (generalized) (M62.81)     Time: 1655-3748 PT Time Calculation (min) (ACUTE ONLY): 20 min  Charges:  $Gait Training: 8-22 mins                      C. Parks [place], [facility] [phone]

## 2020-01-22 NOTE — TOC Progression Note (Signed)
Transition of Care [facility]) - Progression Note    Patient Details  Name: [name] MRN: [id number] Date of Birth: Oct 03, 1931  [facility]) CM/SW Contact  Purcell Mouton, RN Phone Number: 01/22/2020, 11:09 AM  Clinical Narrative:     Pt may go to [facility] today.   Expected Discharge Plan: [place] Barriers to Discharge: No Barriers Identified  Expected Discharge Plan and Services Expected Discharge Plan: [place]   Discharge Planning Services: CM Consult   Living arrangements for the past 2 months: Apartment Expected Discharge Date:  (unknown)                                     Social Determinants of Health (SDOH) Interventions    Readmission Risk Interventions No flowsheet data found.

## 2020-01-22 NOTE — Care Management Important Message (Signed)
Important Message  Patient Details IM Letter given to the Patient. Name: [name] MRN: [id number] Date of Birth: 15-Feb-1932   Medicare Important Message Given:  Yes     Kerin Salen 01/22/2020, 2:07 PM

## 2020-01-22 NOTE — TOC Progression Note (Signed)
Transition of Care [facility]) - Progression Note    Patient Details  Name: [name] MRN: [id number] Date of Birth: 06/19/1931  Transition of Care [facility]) CM/SW Contact  Purcell Mouton, RN Phone Number: 01/22/2020, 12:18 PM  Clinical Narrative:     Pt's wife will transport pt to SNF.   Expected Discharge Plan: [place] Barriers to Discharge: No Barriers Identified  Expected Discharge Plan and Services Expected Discharge Plan: [place]   Discharge Planning Services: CM Consult   Living arrangements for the past 2 months: Apartment Expected Discharge Date: 01/22/20                                     Social Determinants of Health (SDOH) Interventions    Readmission Risk Interventions No flowsheet data found.

## 2020-01-22 NOTE — Discharge Summary (Signed)
[facility] Discharge Summary   Patient: [name] KGS:[id number]  PCP: Seward Carol, MD  Date of admission: 01/14/2020   Date of discharge:  01/22/2020     Discharge Diagnoses:  Principal Problem:   Acute blood loss anemia Active Problems:   History of CVA (cerebrovascular accident)   Postural dizziness with presyncope   Chronic combined systolic and diastolic CHF (congestive heart failure) ([place])   DNR (do not resuscitate)   Acute GI bleeding   AVM (arteriovenous malformation) of small bowel, acquired with hemorrhage  Admitted From: ALF Disposition:  SNF   Recommendations for Outpatient Follow-up:  1. PCP: please follow up with Gastroenterology Neurology and PCP as recommended 2. Follow up LABS/TEST:  CBC iron studies in 1 month   Follow-up Information    Polite, Jori Moll, MD. Schedule an appointment as soon as possible for a visit in 1 week(s).   Specialty: Internal Medicine Contact information: [address] [place] [address] [phone]        Mansouraty, Telford Nab., MD. Schedule an appointment as soon as possible for a visit in 1 month(s).   Specialties: Gastroenterology, Internal Medicine Contact information: [place] [address] [phone]        Garvin Fila, MD. Schedule an appointment as soon as possible for a visit in 2 month(s).   Specialties: Neurology, Radiology Contact information: [place] [address] [phone]              Diet recommendation: Cardiac diet  Activity: The patient is advised to gradually reintroduce usual activities, as tolerated  Discharge Condition: stable  Code Status: DNR   History of present illness: As per the H and P dictated on admission, "This is an 86 year old male with past medical history of CVA with residual left-sided weakness, prediabetes, hyperlipidemia, chronic steroid use for sinusitis, combined systolic and diastolic heart  failure, diverticulosis, hemorrhoids, recurrent diverticular GI bleeding and extensive work-up in the past including bleeding scan, Meckel scan, EGD and colonoscopy, recent hospitalization from 11/20-11/22 for hematochezia who presents today for recurrent GI bleed and presyncopal episode.  States that since he was discharged on 11/22 he had persistent black stools but thought this was just residual blood in the colon.  Noted that his flatulence was more odorous recently.  Yesterday he felt much weaker and this a.m. could barely stand up and had a presyncopal episode and required help from his family.  Typically ambulates with a walker.  At his last hospitalization patient's Hb remained stable and he received IV iron and was cleared for discharge by GI with outpatient follow-up.  He held Plavix for 2 more days after hospitalization and restarted Plavix this past Wednesday and took Plavix for the following several days, last dose yesterday.  Denies any chest pain, nausea or vomiting, shortness of breath.  States he feels a bit better currently than he did this morning.  No other complaints.  Of note, patient takes chronic prednisone for the past 6 months or so for chronic sinusitis."  Hospital Course:  Summary of his active problems in the hospital is as following.   Acute blood loss anemia secondary to recurrent GI bleed, melena On presentation hemoglobin 4.4. As before PRBC transfusion. GI consulted. SP EGD negative for any acute bleeding. Hemoglobin stable since 01/15/2020. Patient continues to have on and off melena. Off Plavix. Capsule endoscopy performed on 01/17/2020, unable to pass beyond stomach secondary to stenosis. Video capsule placed  under anesthesia on 01/18/2020. Results shows AVM. Underwent push enteroscopy with ablation of 4 AVM. Appreciate GI recommendation. Okay to resume aspirin on 01/21/2020. Plavix if preferred need to be held for at least 72 hours. Family wants to transition to  aspirin for now. Outpatient follow-up with neurology.  History of CVA On chronic Plavix,  saw Dr. Leonie Man in September 2021, at that time recommendations were to continue Plavix.  His current hospitalization is the 6th one for GI bleed in the last year and second one in November alone.  Prior provider discussed with Dr. Erlinda Hong. Outpatient follow-up with neurology to discuss further antiplatelet medications.  Chronic combined systolic and diastolic CHF Appears very euvolemic. Received IV Lasix following PRBC transfusion. Most recent 2D echo was done 3 years ago showed an EF of 45%.  Chronic steroid use for sinusitis Continue prednisone Placing the patient at high risk for recurrent gastritis.  Presyncopal episode secondary to symptomatic anemia Currently asymptomatic. Plan to go to SNF post discharge.  Patient was seen by physical therapy, who recommended SNF,  was arranged. On the day of the discharge the patient's vitals were stable, and no other acute medical condition were reported by patient. The patient was felt safe to be discharge at SNF with therapy.  Consultants: Gastroenterology  Procedures: Push enteroscopy x 2 AVM ablation  PRBC transfusion  Capsule endoscopy  Discharge Exam: General: Appear in no distress, no Rash; Oral Mucosa Clear, moist. no Abnormal Neck Mass Or lumps, Conjunctiva normal  Cardiovascular: S1 and S2 Present, no Murmur Respiratory: good respiratory effort, Bilateral Air entry present and CTA, no Crackles, no wheezes Abdomen: Bowel Sound present, Soft and no tenderness Extremities: no Pedal edema Neurology: alert and oriented to time, place, and person affect appropriate. no new focal deficit  Filed Weights   01/15/20 1347 01/18/20 0853 01/18/20 0918  Weight: 68.1 kg 68.1 kg 68.1 kg   Vitals:   01/22/20 0505 01/22/20 0913  BP: 100/62   Pulse: 76   Resp: 16   Temp: 98.4 F (36.9 C)   SpO2: 96% 98%    DISCHARGE MEDICATION: Allergies as  of 01/22/2020      Reactions   Tape Other (See Comments)   SKIN IS SENSITIVE; PLEASE USE PAPER TAPE; SKIN BRUISES AND TEARS EASILY!!   Azithromycin Rash, Other (See Comments)   Pt had a Z-Pak Jan, 2020 - no reaction(??)      Medication List    STOP taking these medications   clopidogrel 75 MG tablet Commonly known as: PLAVIX   ferrous sulfate 325 (65 FE) MG tablet     TAKE these medications   ProAir HFA 108 (90 Base) MCG/ACT inhaler Generic drug: albuterol Inhale 2 puffs into the lungs every 6 (six) hours as needed for wheezing or shortness of breath.   albuterol (2.5 MG/3ML) 0.083% nebulizer solution Commonly known as: PROVENTIL Take 3 mLs by nebulization every 6 (six) hours as needed for wheezing or shortness of breath.   aspirin 81 MG EC tablet Take 1 tablet (81 mg total) by mouth daily. Swallow whole. Start taking on: January 23, 2020   budesonide 0.5 MG/2ML nebulizer solution Commonly known as: PULMICORT Take 2 mLs by nebulization 2 (two) times daily.   diphenhydramine-acetaminophen 25-500 MG Tabs tablet Commonly known as: TYLENOL PM Take 1 tablet by mouth at bedtime.   ferrous [id number] C-folic acid capsule Commonly known as: TRINSICON / FOLTRIN Take 1 capsule by mouth 3 (three) times daily after meals.   fluticasone 50  MCG/ACT nasal spray Commonly known as: FLONASE Place 1 spray into both nostrils daily as needed for allergies or rhinitis.   Melatonin 10 MG Tabs Take 10 mg by mouth at bedtime as needed (for sleep).   metFORMIN 500 MG tablet Commonly known as: GLUCOPHAGE Take 500 mg by mouth daily.   montelukast 10 MG tablet Commonly known as: SINGULAIR Take 10 mg by mouth at bedtime as needed (for breathing flares).   pantoprazole 40 MG tablet Commonly known as: PROTONIX Take 1 tablet (40 mg total) by mouth 2 (two) times daily before a meal.   polyethylene glycol 17 g packet Commonly known as: MIRALAX / GLYCOLAX Take 17 g by mouth daily  as needed for mild constipation (MIX AND DRINK).   predniSONE 10 MG tablet Commonly known as: DELTASONE Take 5 mg by mouth daily.   psyllium 58.6 % powder Commonly known as: METAMUCIL Take 1 packet by mouth daily as needed (for constipation- MIX AND DRINK).   rOPINIRole 3 MG tablet Commonly known as: REQUIP Take 1 tablet (3 mg total) by mouth at bedtime.   simvastatin 40 MG tablet Commonly known as: ZOCOR Take 1 tablet (40 mg total) by mouth daily. What changed: when to take this   traMADol 50 MG tablet Commonly known as: ULTRAM Take 1 tablet (50 mg total) by mouth daily as needed for moderate pain.      Allergies  Allergen Reactions  . Tape Other (See Comments)    SKIN IS SENSITIVE; PLEASE USE PAPER TAPE; SKIN BRUISES AND TEARS EASILY!!  . Azithromycin Rash and Other (See Comments)    Pt had a Z-Pak Jan, 2020 - no reaction(??)   Discharge Instructions    Diet - low sodium heart healthy   Complete by: As directed    Increase activity slowly   Complete by: As directed       The results of significant diagnostics from this hospitalization (including imaging, microbiology, ancillary and laboratory) are listed below for reference.    Significant Diagnostic Studies: No results found.  Microbiology: Recent Results (from the past 240 hour(s))  Resp Panel by RT-PCR (Flu A&B, Covid) Nasopharyngeal Swab     Status: None   Collection Time: 01/14/20  2:59 PM   Specimen: Nasopharyngeal Swab; Nasopharyngeal(NP) swabs in vial transport medium  Result Value Ref Range Status   SARS Coronavirus 2 by RT PCR NEGATIVE NEGATIVE Final    Comment: (NOTE) SARS-CoV-2 target nucleic acids are NOT DETECTED.  The SARS-CoV-2 RNA is generally detectable in upper respiratory specimens during the acute phase of infection. The lowest concentration of SARS-CoV-2 viral copies this assay can detect is 138 copies/mL. A negative result does not preclude SARS-Cov-2 infection and should not be used  as the sole basis for treatment or other patient management decisions. A negative result may occur with  improper specimen collection/handling, submission of specimen other than nasopharyngeal swab, presence of viral mutation(s) within the areas targeted by this assay, and inadequate number of viral copies(<138 copies/mL). A negative result must be combined with clinical observations, patient history, and epidemiological information. The expected result is Negative.  Fact Sheet for Patients:  EntrepreneurPulse.com.au  Fact Sheet for Healthcare Providers:  IncredibleEmployment.be  This test is no t yet approved or cleared by the [place] FDA and  has been authorized for detection and/or diagnosis of SARS-CoV-2 by FDA under an Emergency Use Authorization (EUA). This EUA will remain  in effect (meaning this test can be used) for the duration of  the COVID-19 declaration under Section 564(b)(1) of the Act, 21 U.S.C.section 360bbb-3(b)(1), unless the authorization is terminated  or revoked sooner.       Influenza A by PCR NEGATIVE NEGATIVE Final   Influenza B by PCR NEGATIVE NEGATIVE Final    Comment: (NOTE) The Xpert Xpress SARS-CoV-2/FLU/RSV plus assay is intended as an aid in the diagnosis of influenza from Nasopharyngeal swab specimens and should not be used as a sole basis for treatment. Nasal washings and aspirates are unacceptable for Xpert Xpress SARS-CoV-2/FLU/RSV testing.  Fact Sheet for Patients: EntrepreneurPulse.com.au  Fact Sheet for Healthcare Providers: IncredibleEmployment.be  This test is not yet approved or cleared by the [place] FDA and has been authorized for detection and/or diagnosis of SARS-CoV-2 by FDA under an Emergency Use Authorization (EUA). This EUA will remain in effect (meaning this test can be used) for the duration of the COVID-19 declaration under Section 564(b)(1)  of the Act, 21 U.S.C. section 360bbb-3(b)(1), unless the authorization is terminated or revoked.  Performed at [facility], [place] [address], [place], [place] [address]   SARS Coronavirus 2 by RT PCR (hospital order, performed in [facility] hospital lab) Nasopharyngeal Nasopharyngeal Swab     Status: None   Collection Time: 01/22/20  4:45 AM   Specimen: Nasopharyngeal Swab  Result Value Ref Range Status   SARS Coronavirus 2 NEGATIVE NEGATIVE Final    Comment: (NOTE) SARS-CoV-2 target nucleic acids are NOT DETECTED.  The SARS-CoV-2 RNA is generally detectable in upper and lower respiratory specimens during the acute phase of infection. The lowest concentration of SARS-CoV-2 viral copies this assay can detect is 250 copies / mL. A negative result does not preclude SARS-CoV-2 infection and should not be used as the sole basis for treatment or other patient management decisions.  A negative result may occur with improper specimen collection / handling, submission of specimen other than nasopharyngeal swab, presence of viral mutation(s) within the areas targeted by this assay, and inadequate number of viral copies (<250 copies / mL). A negative result must be combined with clinical observations, patient history, and epidemiological information.  Fact Sheet for Patients:   StrictlyIdeas.no  Fact Sheet for Healthcare Providers: BankingDealers.co.za  This test is not yet approved or  cleared by the [place] FDA and has been authorized for detection and/or diagnosis of SARS-CoV-2 by FDA under an Emergency Use Authorization (EUA).  This EUA will remain in effect (meaning this test can be used) for the duration of the COVID-19 declaration under Section 564(b)(1) of the Act, 21 U.S.C. section 360bbb-3(b)(1), unless the authorization is terminated or revoked sooner.  Performed at [facility], [place]  [address] [place], [place] [address]      Labs: CBC: Recent Labs  Lab 01/18/20 [phone] 01/19/20 0434 01/20/20 0605 01/21/20 0448 01/22/20 0445  WBC 8.6 7.4 7.8 11.3* 11.9*  NEUTROABS  --   --   --   --  8.3*  HGB 8.9* 9.2* 9.6* 9.7* 9.7*  HCT 28.9* 29.9* 30.7* 31.9* 31.4*  MCV 94.1 94.9 95.3 95.2 95.4  PLT 291 273 299 330 765   Basic Metabolic Panel: Recent Labs  Lab 01/17/20 0515 01/18/20 0447 01/19/20 0434 01/20/20 0605 01/21/20 0448  NA 135 139 137 137 138  K 3.5 4.0 4.2 4.0 3.9  CL 105 107 106 104 104  CO2 26 25 23 26 25   GLUCOSE 97 97 100* 104* 101*  BUN 14 9 11 9 12   CREATININE 0.73 0.64  0.82 0.75 0.79  CALCIUM 7.9* 8.1* 8.0* 8.2* 8.2*  MG  --  2.4  --   --   --    Liver Function Tests: Recent Labs  Lab 01/17/20 0515  AST 17  ALT 13  ALKPHOS 45  BILITOT 0.9  PROT 4.5*  ALBUMIN 2.8*   CBG: Recent Labs  Lab 01/16/20 1231 01/20/20 0714  GLUCAP 114* 104*    Time spent: 35 minutes  Signed:  Berle Mull  [facility]  01/22/2020 11:26 AM

## 2020-01-23 ENCOUNTER — Encounter (HOSPITAL_COMMUNITY): Payer: Self-pay | Admitting: Gastroenterology

## 2020-01-23 ENCOUNTER — Telehealth: Payer: Self-pay

## 2020-01-23 ENCOUNTER — Other Ambulatory Visit: Payer: Self-pay

## 2020-01-23 DIAGNOSIS — Z8719 Personal history of other diseases of the digestive system: Secondary | ICD-10-CM

## 2020-01-23 NOTE — Telephone Encounter (Signed)
-----   Message from Irving Copas., MD sent at 01/20/2020 11:04 AM EST ----- Regarding: Recurrent Anemia and Spots/AVMs DJ, Patient returned to hospital with recurrent anemia and occult positive stool.  VCE issues with swallowing and then had to be placed but have repeated a enteroscopy and ablated for spots which were not clearly AVMs but were noted on the VCE.  He has more of these beyond our tattoos so is at risk of recurrent bleeding.  Patient in medicine deciding if they will restart Plavix or hold that completely and go just on aspirin versus hold all antiplatelet therapy they will decide that and go from there.   Patty please arrange a CBC/iron/TIBC/ferritin to be drawn in approximately 4 to 5 weeks with follow-up with DJ or one of the APP's within a day or 2 so that the labs are back for review.Thanks.GM

## 2020-01-23 NOTE — Telephone Encounter (Signed)
Patient has follow up scheduled for 03/08/20.  New lab results ordered for second week in January

## 2020-01-24 LAB — TYPE AND SCREEN
ABO/RH(D): O POS
Antibody Screen: NEGATIVE
Unit division: 0
Unit division: 0

## 2020-01-24 LAB — BPAM RBC
Blood Product Expiration Date: 202201042359
Blood Product Expiration Date: 202201042359
Unit Type and Rh: 5100
Unit Type and Rh: 5100

## 2020-02-15 ENCOUNTER — Ambulatory Visit: Payer: Medicare Other | Admitting: Adult Health

## 2020-02-21 DIAGNOSIS — D62 Acute posthemorrhagic anemia: Secondary | ICD-10-CM | POA: Diagnosis not present

## 2020-02-21 DIAGNOSIS — R2681 Unsteadiness on feet: Secondary | ICD-10-CM | POA: Diagnosis not present

## 2020-02-21 DIAGNOSIS — K5521 Angiodysplasia of colon with hemorrhage: Secondary | ICD-10-CM | POA: Diagnosis not present

## 2020-02-26 DIAGNOSIS — J45991 Cough variant asthma: Secondary | ICD-10-CM | POA: Diagnosis not present

## 2020-02-26 DIAGNOSIS — R0981 Nasal congestion: Secondary | ICD-10-CM | POA: Diagnosis not present

## 2020-03-08 ENCOUNTER — Ambulatory Visit: Payer: Medicare Other | Admitting: Gastroenterology

## 2020-03-18 DIAGNOSIS — I639 Cerebral infarction, unspecified: Secondary | ICD-10-CM | POA: Diagnosis not present

## 2020-03-18 DIAGNOSIS — E1169 Type 2 diabetes mellitus with other specified complication: Secondary | ICD-10-CM | POA: Diagnosis not present

## 2020-03-18 DIAGNOSIS — I6789 Other cerebrovascular disease: Secondary | ICD-10-CM | POA: Diagnosis not present

## 2020-03-18 DIAGNOSIS — G459 Transient cerebral ischemic attack, unspecified: Secondary | ICD-10-CM | POA: Diagnosis not present

## 2020-03-18 DIAGNOSIS — E7800 Pure hypercholesterolemia, unspecified: Secondary | ICD-10-CM | POA: Diagnosis not present

## 2020-03-18 DIAGNOSIS — J45901 Unspecified asthma with (acute) exacerbation: Secondary | ICD-10-CM | POA: Diagnosis not present

## 2020-03-18 DIAGNOSIS — J4541 Moderate persistent asthma with (acute) exacerbation: Secondary | ICD-10-CM | POA: Diagnosis not present

## 2020-03-18 DIAGNOSIS — G4700 Insomnia, unspecified: Secondary | ICD-10-CM | POA: Diagnosis not present

## 2020-03-18 DIAGNOSIS — E119 Type 2 diabetes mellitus without complications: Secondary | ICD-10-CM | POA: Diagnosis not present

## 2020-03-18 DIAGNOSIS — I10 Essential (primary) hypertension: Secondary | ICD-10-CM | POA: Diagnosis not present

## 2020-03-19 ENCOUNTER — Ambulatory Visit: Payer: Medicare Other | Admitting: Gastroenterology

## 2020-03-19 ENCOUNTER — Other Ambulatory Visit (INDEPENDENT_AMBULATORY_CARE_PROVIDER_SITE_OTHER): Payer: Medicare Other

## 2020-03-19 ENCOUNTER — Encounter: Payer: Self-pay | Admitting: Gastroenterology

## 2020-03-19 ENCOUNTER — Other Ambulatory Visit: Payer: Self-pay

## 2020-03-19 VITALS — BP 120/78 | HR 88 | Ht 69.0 in | Wt 155.5 lb

## 2020-03-19 DIAGNOSIS — K625 Hemorrhage of anus and rectum: Secondary | ICD-10-CM | POA: Diagnosis not present

## 2020-03-19 DIAGNOSIS — Z8719 Personal history of other diseases of the digestive system: Secondary | ICD-10-CM | POA: Diagnosis not present

## 2020-03-19 LAB — IBC + FERRITIN
Ferritin: 35.6 ng/mL (ref 22.0–322.0)
Iron: 179 ug/dL — ABNORMAL HIGH (ref 42–165)
Saturation Ratios: 44.9 % (ref 20.0–50.0)
Transferrin: 285 mg/dL (ref 212.0–360.0)

## 2020-03-19 LAB — CBC
HCT: 46 % (ref 39.0–52.0)
Hemoglobin: 15 g/dL (ref 13.0–17.0)
MCHC: 32.6 g/dL (ref 30.0–36.0)
MCV: 93.9 fl (ref 78.0–100.0)
Platelets: 311 10*3/uL (ref 150.0–400.0)
RBC: 4.89 Mil/uL (ref 4.22–5.81)
RDW: 16.6 % — ABNORMAL HIGH (ref 11.5–15.5)
WBC: 10 10*3/uL (ref 4.0–10.5)

## 2020-03-19 MED ORDER — PANTOPRAZOLE SODIUM 40 MG PO TBEC
40.0000 mg | DELAYED_RELEASE_TABLET | Freq: Every day | ORAL | 3 refills | Status: DC
Start: 2020-03-19 — End: 2020-04-22

## 2020-03-19 NOTE — Patient Instructions (Signed)
If you are age 85 or older, your body mass index should be between 23-30. Your Body mass index is 22.96 kg/m. If this is out of the aforementioned range listed, please consider follow up with your Primary Care Provider.   We have sent the following medications to your pharmacy for you to pick up at your convenience:  DECREASE: protonix 40mg  to once daily.  Please remember to take your Aspirin 81mg  everyday.  Please contact our office at [phone] if you have any new symptoms of rectal bleeding.  Thank you for entrusting me with your care and choosing [facility].  Dr Ardis Hughs

## 2020-03-19 NOTE — Progress Notes (Signed)
Review of pertinent gastrointestinal problems:  1. routine risk for colon cancer.Colonoscopy 2003 with Dr. Inocente Salles, polyps removed but none were adenomatous. Colonoscopy June 2009 Dr. Ardis Hughs found no polyps. Significant left sided diverticulosis was noted.  2. esophageal ring, focal stricture dilated 2009with deeper than usual mucosal tear, followup Gastrografin esophagram showed no extravasation  3. status post fundoplication 4.Recurrent GI bleeding, presumed diverticular. 2009did not require blood transfusion;2018 he was hospitalized twice for overt red rectal bleeding, presumed diverticular.Hb nadir 6.8 (normally Hb 14).Colonoscopy 11/2016 Dr. Carlean Purl confirmed multiple left-sided diverticulosis pockets, the prep in the right side was poor. There was no acute blood in the colon. He also presumed that his acute bleeding was diverticular in origin.Chronic Plavix use probably contributes. 01/2018 another episode Hb nadir 8 (normally Hb 12s). ReceivedtwounitsPRBC. Repeat CT scan 01/2018 with IV contrast (ordered by ER I believe) showed no clear source.  Nuclear medicine bleeding scan was positive for active GI bleeding "with right colon as a suspected source of bleeding".  Follow-up interventional radiology angiogram was negative for acute bleeding.  Nuc med Meckel's scan was negative.  Repeat colonoscopy February 2021 showed diverticulosis with dark stool throughout polyp possibly melena.  EGD February 2021 showed hiatal hernia, some gastritis, essentially no obvious source of his GI bleeding.  He received a total of 5 units blood this admission.  01/2020 Admission seemed to be bleeding from an SM source (actively bleeding AVM on capsule endo), underwent 3 endoscopic procedures with eventual cautery of non-bleeding SB AVMs by enteroscopy.  Also stopped plavix in exchange for 81mg ASA daily 5. Splenectomy; 2009,ruptured.    HPI: This is a very pleasant 87 yo man here with his wife.  He was  admitted for several days in 12/2019 and 01/2020 with GI bleeding. This time he was having melenic stools (not maroon or frankly BRBPR as had been the case in the past). See summary above.  Since d/c he's done very well. He takes ASA 81mg  daily (instead of plavix).  No TIA or CVA symptoms.  He takes iron orallly once daily.  Has darkish stools almost always.  No BRBPR, no maroon stools, no frank tarry stools.   I last saw him in the office about a year ago, March 2021.  We had a long discussion about his ongoing high need for blood thinner Plavix which certainly increases the volume of any bleeding which he would have.  He cannot unfortunately stop that medicine due to significant risk for stroke.  We discussed elective referral to surgeon however he did not want to go through with that because it would likely require total abdominal colectomy.  I had a long discussion also about further testing and he and his wife are both very clear that if he were to have another episode of obvious overt bleeding he would not want any more testing done besides lab tests and blood transfusions.  He said all the testing really drains him and he.  He presented again with overt GI bleeding December 2021, admitting hemoglobin was 4.4.  Despite all the above he underwent quite a lot of more testing.  Dr. Rush Landmark performed a small bowel endoscopy on him 01/16/2020.  Dilated some benign strictures in his esophagus, biopsied his esophagus to check for eosinophilic changes.  No bleeding was seen in the upper GI or small bowel.  He placed a video capsule endoscope pill but it did not exit the stomach and so he underwent another procedure January 18, 2020 at which time the  video capsule endoscope was placed in the duodenum.  He underwent video capsule endoscopy and there is report from Dr. Bryan Lemma stating that "a single site of active bleeding in the proximal small bowel approximately 4 minutes after VCE delivery and 7  minutes proximal to the likely tattoo site was noted".  Because of this he underwent another small bowel enteroscopy with Dr. Rush Landmark: He documented 2 spots with no bleeding in the duodenum were treated with APC.  There were jejunal spots with no bleeding and those were also treated with APC.  He had a CBC just prior to this visit.  Those results are not available yet.  He has not resumed Plavix as stat is taking aspirin 325 mg once daily.  ROS: complete GI ROS as described in HPI, all other review negative.  Constitutional:  No unintentional weight loss   Past Medical History:  Diagnosis Date  . Acute CVA (cerebrovascular accident) ([place]) 07/25/2015  . Asthma   . Benign essential HTN   . Cerebral infarction due to embolism of left middle cerebral artery ([place]) 02/03/2014  . Chronic combined systolic and diastolic CHF (congestive heart failure) ([place]) 12/23/2017  . Colitis   . Colon polyps   . Diabetes mellitus type 2, diet-controlled ([place]) 12/22/2013  . Diverticulosis   . GERD (gastroesophageal reflux disease)   . GI bleed   . Hemorrhoids   . Hiatal hernia   . History of esophageal strciture   . HLD (hyperlipidemia)   . Osteoarthritis   . Proctitis   . Status post dilation of esophageal narrowing   . Stroke [facility])     Past Surgical History:  Procedure Laterality Date  . ABDOMINAL HERNIA REPAIR    . BIOPSY  04/05/2019   Procedure: BIOPSY;  Surgeon: Thornton Simko, MD;  Location: [place];  Service: Gastroenterology;;  . COLONOSCOPY     multiple  . COLONOSCOPY WITH PROPOFOL N/A 12/12/2016   Procedure: COLONOSCOPY WITH PROPOFOL;  Surgeon: Gatha Mayer, MD;  Location: [facility] ENDOSCOPY;  Service: Endoscopy;  Laterality: N/A;  . COLONOSCOPY WITH PROPOFOL N/A 04/04/2019   Procedure: COLONOSCOPY WITH PROPOFOL;  Surgeon: Milus Banister, MD;  Location: [facility] ENDOSCOPY;  Service: Endoscopy;  Laterality: N/A;  . ENTEROSCOPY N/A 01/16/2020   Procedure: ENTEROSCOPY;  Surgeon:  Rush Landmark Telford Nab., MD;  Location: [facility] ENDOSCOPY;  Service: Gastroenterology;  Laterality: N/A;  . ENTEROSCOPY N/A 01/20/2020   Procedure: ENTEROSCOPY;  Surgeon: Rush Landmark Telford Nab., MD;  Location: [facility] ENDOSCOPY;  Service: Gastroenterology;  Laterality: N/A;  . ESOPHAGOGASTRODUODENOSCOPY     multiple  . ESOPHAGOGASTRODUODENOSCOPY (EGD) WITH PROPOFOL N/A 04/05/2019   Procedure: ESOPHAGOGASTRODUODENOSCOPY (EGD) WITH PROPOFOL;  Surgeon: Thornton Matsunaga, MD;  Location: [place];  Service: Gastroenterology;  Laterality: N/A;  . ESOPHAGOGASTRODUODENOSCOPY (EGD) WITH PROPOFOL N/A 01/18/2020   Procedure: ESOPHAGOGASTRODUODENOSCOPY (EGD) WITH PROPOFOL;  Surgeon: Doran Stabler, MD;  Location: [facility] ENDOSCOPY;  Service: Gastroenterology;  Laterality: N/A;  For placement of video capsule study  . GIVENS CAPSULE STUDY N/A 01/16/2020   Procedure: GIVENS CAPSULE STUDY;  Surgeon: Irving Copas., MD;  Location: [facility] ENDOSCOPY;  Service: Gastroenterology;  Laterality: N/A;  . GIVENS CAPSULE STUDY N/A 01/18/2020   Procedure: GIVENS CAPSULE STUDY;  Surgeon: Doran Stabler, MD;  Location: [facility] ENDOSCOPY;  Service: Gastroenterology;  Laterality: N/A;  . HOT HEMOSTASIS N/A 01/20/2020   Procedure: HOT HEMOSTASIS (ARGON PLASMA COAGULATION/BICAP);  Surgeon: Irving Copas., MD;  Location: [facility] ENDOSCOPY;  Service: Gastroenterology;  Laterality: N/A;  . INSERTION  OF MESH  01/29/2012   Procedure: INSERTION OF MESH;  Surgeon: Gwenyth Ober, MD;  Location: [place];  Service: General;  Laterality: N/A;  . IR ANGIOGRAM SELECTIVE EACH ADDITIONAL VESSEL  04/01/2019  . IR ANGIOGRAM VISCERAL SELECTIVE  04/01/2019  . IR ANGIOGRAM VISCERAL SELECTIVE  04/01/2019  . IR ANGIOGRAM VISCERAL SELECTIVE  04/01/2019  . IR US GUIDE VASC ACCESS RIGHT  04/01/2019  . NISSEN FUNDOPLICATION    . SAVORY DILATION N/A 01/16/2020   Procedure: SAVORY DILATION;  Surgeon: Rush Landmark Telford Nab., MD;  Location: [facility];   Service: Gastroenterology;  Laterality: N/A;  . SCLEROTHERAPY  01/20/2020   Procedure: SCLEROTHERAPY;  Surgeon: Rush Landmark Telford Nab., MD;  Location: [facility] ENDOSCOPY;  Service: Gastroenterology;;  . SPLENECTOMY, TOTAL     nontraumatic rupture  . SUBMUCOSAL TATTOO INJECTION  01/16/2020   Procedure: SUBMUCOSAL TATTOO INJECTION;  Surgeon: Irving Copas., MD;  Location: [facility] ENDOS[facility];  Service: Gastroenterology;;  . VENTRAL HERNIA REPAIR  01/29/2012    WITH MESH  . VENTRAL HERNIA REPAIR  01/29/2012   Procedure: HERNIA REPAIR VENTRAL ADULT;  Surgeon: Gwenyth Ober, MD;  Location: [place];  Service: General;  Laterality: N/A;  open recurrent ventral hernia repair with mesh    Current Outpatient Medications  Medication Sig Dispense Refill  . albuterol (PROAIR HFA) 108 (90 Base) MCG/ACT inhaler Inhale 2 puffs into the lungs every 6 (six) hours as needed for wheezing or shortness of breath.    Marland Kitchen albuterol (PROVENTIL) (2.5 MG/3ML) 0.083% nebulizer solution Take 3 mLs by nebulization every 6 (six) hours as needed for wheezing or shortness of breath.     Marland Kitchen aspirin EC 81 MG EC tablet Take 1 tablet (81 mg total) by mouth daily. Swallow whole. 30 tablet 11  . budesonide (PULMICORT) 0.5 MG/2ML nebulizer solution Take 2 mLs by nebulization 2 (two) times daily.    . diphenhydramine-acetaminophen (TYLENOL PM) 25-500 MG TABS tablet Take 1 tablet by mouth at bedtime.     . fluticasone (FLONASE) 50 MCG/ACT nasal spray Place 1 spray into both nostrils daily as needed for allergies or rhinitis.   5  . Melatonin 10 MG TABS Take 10 mg by mouth at bedtime as needed (for sleep).     . metFORMIN (GLUCOPHAGE) 500 MG tablet Take 500 mg by mouth daily.    . montelukast (SINGULAIR) 10 MG tablet Take 10 mg by mouth at bedtime as needed (for breathing flares).     . pantoprazole (PROTONIX) 40 MG tablet Take 1 tablet (40 mg total) by mouth 2 (two) times daily before a meal. 60 tablet 0  . polyethylene glycol (MIRALAX /  GLYCOLAX) packet Take 17 g by mouth daily as needed for mild constipation (MIX AND DRINK).     . predniSONE (DELTASONE) 10 MG tablet Take 5 mg by mouth daily.     . psyllium (METAMUCIL) 58.6 % powder Take 1 packet by mouth daily as needed (for constipation- MIX AND DRINK).     Marland Kitchen rOPINIRole (REQUIP) 3 MG tablet Take 1 tablet (3 mg total) by mouth at bedtime. 90 tablet 3  . simvastatin (ZOCOR) 40 MG tablet Take 1 tablet (40 mg total) by mouth daily. (Patient taking differently: Take 40 mg by mouth at bedtime. ) 30 tablet 3  . traMADol (ULTRAM) 50 MG tablet Take 1 tablet (50 mg total) by mouth daily as needed for moderate pain. 3 tablet 0   No current facility-administered medications for this visit.    Allergies  as of 03/19/2020 - Review Complete 03/19/2020  Allergen Reaction Noted  . Tape Other (See Comments) 02/04/2018  . Azithromycin Rash and Other (See Comments) 03/08/2015    Family History  Problem Relation Age of Onset  . Alzheimer's disease Mother   . Breast cancer Mother   . Throat cancer Father   . Colon cancer Neg Hx   . Colon polyps Neg Hx   . Pancreatic cancer Neg Hx   . Stomach cancer Neg Hx   . Liver disease Neg Hx     Social History   Socioeconomic History  . Marital status: Married    Spouse name: [name]  . Number of children: 2  . Years of education: Bachelor's  . Highest education level: Not on file  Occupational History  . Occupation: retired  Tobacco Use  . Smoking status: Never Smoker  . Smokeless tobacco: Never Used  Substance and Sexual Activity  . Alcohol use: Yes    Alcohol/week: 2.0 standard drinks    Types: 2 Glasses of wine per week    Comment: daily  . Drug use: No  . Sexual activity: Not on file  Other Topics Concern  . Not on file  Social History Narrative   Patient is married and has 2 children.   Patient is right handed.   Patient has Bachelor's degree.   Patient drinks 2 cups daily.   Social Determinants of Health   Financial  Resource Strain: Not on file  Food Insecurity: Not on file  Transportation Needs: Not on file  Physical Activity: Not on file  Stress: Not on file  Social Connections: Not on file  Intimate Partner Violence: Not on file     Physical Exam: Ht 5\' 9"  (1.753 m)   Wt 155 lb 8 oz (70.5 kg)   BMI 22.96 kg/m  Constitutional: generally well-appearing Psychiatric: alert and oriented x3 Abdomen: soft, nontender, nondistended, no obvious ascites, no peritoneal signs, normal bowel sounds No peripheral edema noted in lower extremities  Assessment and plan: 87 y.o. male with recurrent GI bleeding  All of his previous bleeds were bright red blood per rectum, presumed colonic, presumed diverticular.  This most recent bleed in December was dark stools, seems like it may have been related to small bowel AVMs which is new for him.  He's done well since d/c. No TIA or CVA symptoms and no obvious recurrent bleeding.    He will continue aspirin 81 mg once daily.  He will continue Protonix 40 mg once daily, I have changed his prescription so that he is only taking it once daily.  He will call if he thinks he is having bleeding again.  His CBC from today is still pending.  I will contact her when I see those results.  Please see the "Patient Instructions" section for addition details about the plan.  Owens Loffler, MD [facility] 03/19/2020, 3:41 PM   Total time on date of encounter was 35 minutes (this included time spent preparing to see the patient reviewing records; obtaining and/or reviewing separately obtained history; performing a medically appropriate exam and/or evaluation; counseling and educating the patient and family if present; ordering medications, tests or procedures if applicable; and documenting clinical information in the health record).

## 2020-03-20 ENCOUNTER — Other Ambulatory Visit: Payer: Self-pay

## 2020-03-20 DIAGNOSIS — K625 Hemorrhage of anus and rectum: Secondary | ICD-10-CM

## 2020-03-27 DIAGNOSIS — I10 Essential (primary) hypertension: Secondary | ICD-10-CM | POA: Diagnosis not present

## 2020-03-27 DIAGNOSIS — E1169 Type 2 diabetes mellitus with other specified complication: Secondary | ICD-10-CM | POA: Diagnosis not present

## 2020-03-27 DIAGNOSIS — E7800 Pure hypercholesterolemia, unspecified: Secondary | ICD-10-CM | POA: Diagnosis not present

## 2020-03-27 DIAGNOSIS — G4700 Insomnia, unspecified: Secondary | ICD-10-CM | POA: Diagnosis not present

## 2020-03-27 DIAGNOSIS — E119 Type 2 diabetes mellitus without complications: Secondary | ICD-10-CM | POA: Diagnosis not present

## 2020-03-27 DIAGNOSIS — J45909 Unspecified asthma, uncomplicated: Secondary | ICD-10-CM | POA: Diagnosis not present

## 2020-03-27 DIAGNOSIS — J4520 Mild intermittent asthma, uncomplicated: Secondary | ICD-10-CM | POA: Diagnosis not present

## 2020-03-27 DIAGNOSIS — G459 Transient cerebral ischemic attack, unspecified: Secondary | ICD-10-CM | POA: Diagnosis not present

## 2020-03-27 DIAGNOSIS — J4540 Moderate persistent asthma, uncomplicated: Secondary | ICD-10-CM | POA: Diagnosis not present

## 2020-03-27 DIAGNOSIS — J45901 Unspecified asthma with (acute) exacerbation: Secondary | ICD-10-CM | POA: Diagnosis not present

## 2020-03-27 DIAGNOSIS — J4541 Moderate persistent asthma with (acute) exacerbation: Secondary | ICD-10-CM | POA: Diagnosis not present

## 2020-04-06 DIAGNOSIS — R6889 Other general symptoms and signs: Secondary | ICD-10-CM | POA: Diagnosis not present

## 2020-04-06 DIAGNOSIS — Z743 Need for continuous supervision: Secondary | ICD-10-CM | POA: Diagnosis not present

## 2020-04-06 DIAGNOSIS — R4781 Slurred speech: Secondary | ICD-10-CM | POA: Diagnosis not present

## 2020-04-06 DIAGNOSIS — R29810 Facial weakness: Secondary | ICD-10-CM | POA: Diagnosis not present

## 2020-04-06 DIAGNOSIS — G459 Transient cerebral ischemic attack, unspecified: Secondary | ICD-10-CM | POA: Diagnosis not present

## 2020-04-08 ENCOUNTER — Telehealth: Payer: Self-pay | Admitting: Adult Health

## 2020-04-08 ENCOUNTER — Ambulatory Visit: Payer: Self-pay | Admitting: Neurology

## 2020-04-08 NOTE — Telephone Encounter (Signed)
Called and spoke to pt and he will come in 1430 today with Dr. Leonie Man.

## 2020-04-08 NOTE — Telephone Encounter (Signed)
Pt called stating that he had a TIA on Saturday and is wanting to speak to the RN regarding what he should do and also to discuss his medications. Please advise.

## 2020-04-08 NOTE — Telephone Encounter (Signed)
Did you document on the wrong person?  We had not done recent lipid panel nor have I seen him since 10/2019.   In regards to reported TIA on Saturday, what did these symptoms consist of and how long do they last for? Has he had any reoccurring symptoms or has he experienced these symptoms previously? Did he go anywhere to be evaluated during that time?

## 2020-04-08 NOTE — Telephone Encounter (Addendum)
I did speak to pt and wife.  Pt did have TIA 20-30 minute episode on Saturday of not being about to get words out. He is taking 81mg  ASA and took additional dose yesterday.  Not Taking plavix due to having GI Bleed back in hospital 12/2019.   I held appt for him on Wednesday if needed on [place] schedule.  He is back to baseline symptonwise. Saw EMS bp elevated 160/90, EKG ok when this happened.    Please advise.

## 2020-04-08 NOTE — Telephone Encounter (Deleted)
I called pt and relayed his results of LDL 64 good, tiglyerides significantly elevated see pcp for furthrw evaluation.  See R.Baity NP [place] pcp.  he is taling atorvastatin and fenofibrate currently.  has not see pcp is some time.  Will forward to her as well.  He will call and make appt.

## 2020-04-09 NOTE — Telephone Encounter (Signed)
Unfortunately pt was unable to make the appt yesterday due to living in [place], and would take about 60min.  Dr. Leonie Man could not see later. Appt made with JM/NP for 04-10-20 at 1515.  Wife and pt aware.

## 2020-04-09 NOTE — Telephone Encounter (Signed)
Okay; thanks.

## 2020-04-10 ENCOUNTER — Other Ambulatory Visit: Payer: Self-pay

## 2020-04-10 ENCOUNTER — Ambulatory Visit: Payer: Medicare Other | Admitting: Adult Health

## 2020-04-10 ENCOUNTER — Encounter: Payer: Self-pay | Admitting: Adult Health

## 2020-04-10 VITALS — BP 145/87 | HR 79 | Ht 70.0 in | Wt 154.0 lb

## 2020-04-10 DIAGNOSIS — R479 Unspecified speech disturbances: Secondary | ICD-10-CM | POA: Diagnosis not present

## 2020-04-10 DIAGNOSIS — G459 Transient cerebral ischemic attack, unspecified: Secondary | ICD-10-CM

## 2020-04-10 MED ORDER — ASPIRIN EC 325 MG PO TBEC: 325.0000 mg | DELAYED_RELEASE_TABLET | Freq: Every day | ORAL | 11 refills | Status: DC

## 2020-04-10 NOTE — Patient Instructions (Signed)
Your recent speech difficulty could have been in setting of a TIA (transient ischemic attack) or possibly a type of complicated migraine especially as you have experienced these similar episodes.   Your Plan:  Recommend trial of increase aspirin dosage to aspirin EC 325 mg daily -please monitor for any type of bleeding or bruising  We can hold off on any further evaluation at this time such as with imaging especially as your current treatment plan would not change  Please let me know if you have any additional events or call 911 immediately with any prolonged episodes or new stroke/TIA symptoms    Follow-up in September as scheduled      Thank you for coming to see [place] at [facility]. I hope we have been able to provide you high quality care today.  You may receive a patient satisfaction survey over the next few weeks. We would appreciate your feedback and comments so that we may continue to improve ourselves and the health of our patients.

## 2020-04-10 NOTE — Progress Notes (Signed)
[facility]  PATIENT: [name] DOB: 1931-07-29   REASON FOR VISIT: Recent slurred speech event HISTORY FROM: Patient and wife    HISTORY OF PRESENT ILLNESS:  Today, 04/10/2020, [name] returns for acute visit accompanied by his wife with concern of possible recurrent TIA.  Reports on 04/06/2020 he had 20 to 30 minute episode of slurred speech and slight word finding difficulty Denies weakness, facial droop, visual changes, confusion or AMS, numbness/tingling, or headache Evaluated by EMS - slightly elevated blood pressure but otherwise EKG and other vitals normal No symptoms prior to onset or after event such as fatigue or confusion  He has had similar episodes previously in 2010, 2015, 2017 and 2020 dx with TIA vs seizure type episode He denies personal prior diagnosis or family history of migraines or confirmed seizures  He is currently only on aspirin 81 mg daily as he had continued recurrent GI bleeds with acute blood loss anemia while on Plavix most recently in 12/2019.  Plavix was discontinued at that time. Continues to follow with GI personally reviewed OV note and they felt chronic Plavix use possibly contributing to continued recurrent GI bleeds.  He has not had any additional bleeding or concerns on aspirin alone.  He does question potential need of Plavix  Reports routine lab work with PCP Dr. Delfina Redwood which has been satisfactory (unable to view via epic) Blood pressure routinely monitored which has been stable  No further concerns at this time   History provided for reference purposes only listed from most recent to oldest Update 10/26/2019 JM:  [name] returns for follow-up regarding history of chronic strokes, TIAs and TIA versus seizure episode He is accompanied by his wife He has been stable from a neurological standpoint since prior visit without new or reoccurring stroke/TIA symptoms or seizure type symptoms. Residual stroke deficits of left  spastic hemiparesis which has been stable not worsening Currently using Requip 2 mg nightly for LLE "jerking" previously working well but more recently he has noticed worsening symptoms and is requesting increased Requip dosage Continues to use AFO brace and Rollator walker at all times and denies any recent falls has had ED admissions since prior visit for acute on chronic GI bleed due to diverticulosis requiring transfusions with most recent admission 10/10/2019.  Recommended to hold Plavix for 1 week.  He has since restarted Plavix without evidence of reoccurring bleeding and denies bruising Continues on simvastatin without myalgias Blood pressure today 120/73 Continues to follow closely with PCP for HTN, HLD and DM management No further concerns   Update 09/28/2018 Dr. Leonie Man : He returns for follow-up after last visit with me in April 2019.  He is accompanied by his wife.  He had an episode of possible TIA versus seizure in March 2020.  Patient had been admitted for diverticular bleed to the hospital and had been off Plavix for 5 days.  After the patient was discharged home in the same evening the wife noticed that he had speech difficulties and was unable to speak or verbalize he had was able unable to respond to the wife and a blank look on his face.  She called 911 patient went back to the hospital this episode of speech difficulty lasted 10 to 20 minutes.  Work-up in the emergency room included a CT scan without contrast which did not show acute abnormality.  CT angiogram of the head and neck was obtained which did not show any major stenosis.  MRI scan  of the brain was also obtained which showed old basal ganglia infarct without any acute infarct and changes of small vessel disease and generalized atrophy.  Patient has done well since discharge has had no further recurrent episodes of speech disturbance, TIA or seizures.  Is tolerating Plavix with only minor bruising.  His blood pressures well  controlled today 121/62.  His last hemoglobin A1c was 6.6 checked 2 months ago.  He remains on Zocor which is tolerating well without myalgias but does have some knee and foot pain.  Continues to have gait and balance difficulties.  He does use a wheeled walker and states he is careful and has not had any major falls or injuries.  He is having dysphagia and plans to have elective esophageal dilatation to be done by Dr. Ardis Hughs on 10/07/2018 and wants neurological clearance and to hold Plavix for the procedure.  Update the 06/09/2017 Dr. Leonie Man :he returns for follow-up after last for this 6 months ago. He is accompanied by his wife. He has not had any recurrent stroke or TIA symptoms. He continues to have left leg weakness and difficulty with gait and balance. He does use a wheeled walker all the time. He has not had any falls or injuries recently. He states his memory difficulties unchanged and are not progressive. His remains on Plavix which is tolerating well with only minor bruising but no bleeding. States his blood pressure is under good control today it is 124/72. He is tolerating Zocor well and last lipid profile checked for satisfactory by his primary physician. He was diagnosed with pneumonia 2 weeks ago by his primary physician and given a course of antibiotics and is now improving.  Update 11/25/2016 Dr. Leonie Man :  He returns for follow-up after last visit 4 months ago. He is accompanied by his wife. His abnormal further recurrent TIA or stroke symptoms. He had to stop Plavix for a week as he had some rectal bleed which was felt to be diverticular bleed. He has since resumed Plavix and is doing all right without further bleeding problems. He continues to walk with a walker due to stiffness and weakness in left leg as well as left knee pain which is bothersome. He recently got intra-articular injection by Dr.: Into his left knee. Uses a wheeled walker regularly. He states is careful and has not had any  recent falls. He is complaining of mild short-term memory difficulties as well as double finding words and completing sentences. He does not participate in any mentally challenging activities.  UPDATE 07/22/2016.CM [name], 87 year old male returns for follow-up with possible TIA event over the weekend where he had slurred speech and right hand numbness for 30 minutes. He is currently on aspirin 325 mg daily. He is on Zocor for hyperlipidemia and is due to have labs next week his primary care. Blood pressure in the office today 149/78 he is continuing to get physical therapy 2 times a week for left lower extremity weakness since admission for stroke and 02/22/2016 . Carotid Doppler at that time 1-39% bilateral ICA stenosis negative MRA of the head MRI with right lateral thalamus and right posterior limb internal capsule infarct. He had another admission to the ER on 05/04/2016 for aphasia/ TIA. MRI without acute infarct. Marland Kitchen He lives in independent living at [facility]. He returns for reevaluation  HISTORY 07/2015 Dr. Cyndra Numbers had acute onset of dizziness on 07/25/15 and was admitted to [facility]. MRI showed tiny acute infarct in the superior left occipital  lobe. CTA head and neck unremarkable. EF 45-50%, A1C 6.5, LDL not checked. His investigational meds were discontinued. He was put on plavix. Further embolic work up deferred as he would like to be discharged the 2nd day.   Discussed with pt and his wife that he had recurrent episodes of word finding difficulty. MRI on 12/22/13 showed acute infarct at left temporal cortical region. He was enrolled to RESPECT ESUS trial to compare ASA vs. pradaxa in 04/2014. However, he had another TIA episode with word finding difficulty and MRI negative. He was managed to be remained in the trial. This time his symptoms are different from previous, but MRI showed acute tiny infarct at left occipital lobe. He had 30 day cardiac monitoring in the past with Dr. Wynonia Lawman was told negative  for afib. During to recurrent episodes and embolic pattern, we can either put him on coumadin and INR 2-3 or continue plavix and check TEE and consider loop recorder. Pros and cons of either approach have been discussed with pt and wife. Pt more leaning towards coumadin and wife leaning toward the other. They would like to further discuss with PCP Dr. Delfina Redwood and let me know. I will forward the note to Dr. Delfina Redwood today and they will call him next week.          REVIEW OF SYSTEMS: Full 14 system review of systems performed and notable only for those listed in HPI, all others are neg   ALLERGIES: Allergies  Allergen Reactions  . Tape Other (See Comments)    SKIN IS SENSITIVE; PLEASE USE PAPER TAPE; SKIN BRUISES AND TEARS EASILY!!  . Azithromycin Rash and Other (See Comments)    Pt had a Z-Pak Jan, 2020 - no reaction(??)    HOME MEDICATIONS: Outpatient Medications Prior to Visit  Medication Sig Dispense Refill  . albuterol (PROVENTIL) (2.5 MG/3ML) 0.083% nebulizer solution Take 3 mLs by nebulization every 6 (six) hours as needed for wheezing or shortness of breath.     Marland Kitchen albuterol (VENTOLIN HFA) 108 (90 Base) MCG/ACT inhaler Inhale 2 puffs into the lungs every 6 (six) hours as needed for wheezing or shortness of breath.    . budesonide (PULMICORT) 0.5 MG/2ML nebulizer solution Take 2 mLs by nebulization 2 (two) times daily.    . Cholecalciferol (VITAMIN D3 PO) Take 1 tablet by mouth daily.    . Cyanocobalamin (VITAMIN B-12 PO) Take 1 tablet by mouth daily at 6 (six) AM.    . diphenhydramine-acetaminophen (TYLENOL PM) 25-500 MG TABS tablet Take 1 tablet by mouth at bedtime.    . ferrous sulfate 325 (65 FE) MG tablet Take 325 mg by mouth daily with breakfast.    . fluticasone (FLONASE) 50 MCG/ACT nasal spray Place 1 spray into both nostrils daily as needed for allergies or rhinitis.   5  . Melatonin 10 MG TABS Take 10 mg by mouth at bedtime as needed (for sleep).     . Melatonin 5 MG CAPS  Take 1 capsule by mouth daily.    . metFORMIN (GLUCOPHAGE) 500 MG tablet Take 500 mg by mouth daily.    . Multiple Vitamins-Minerals (ZINC PO) Take 1 tablet by mouth daily.    . pantoprazole (PROTONIX) 40 MG tablet Take 1 tablet (40 mg total) by mouth daily. 90 tablet 3  . polyethylene glycol (MIRALAX / GLYCOLAX) packet Take 17 g by mouth daily as needed for mild constipation (MIX AND DRINK).     . predniSONE (DELTASONE) 10 MG tablet Take  5 mg by mouth daily.     . psyllium (METAMUCIL) 58.6 % powder Take 1 packet by mouth daily as needed (for constipation- MIX AND DRINK).     Marland Kitchen rOPINIRole (REQUIP) 3 MG tablet Take 1 tablet (3 mg total) by mouth at bedtime. 90 tablet 3  . simvastatin (ZOCOR) 40 MG tablet Take 1 tablet (40 mg total) by mouth daily. (Patient taking differently: Take 40 mg by mouth at bedtime.) 30 tablet 3  . traMADol (ULTRAM) 50 MG tablet Take 1 tablet (50 mg total) by mouth daily as needed for moderate pain. 3 tablet 0  . vitamin C (ASCORBIC ACID) 500 MG tablet Take 500 mg by mouth daily.    Marland Kitchen aspirin EC 81 MG EC tablet Take 1 tablet (81 mg total) by mouth daily. Swallow whole. 30 tablet 11   No facility-administered medications prior to visit.    PAST MEDICAL HISTORY: Past Medical History:  Diagnosis Date  . Acute CVA (cerebrovascular accident) ([place]) 07/25/2015  . Asthma   . Benign essential HTN   . Cerebral infarction due to embolism of left middle cerebral artery ([place]) 02/03/2014  . Chronic combined systolic and diastolic CHF (congestive heart failure) ([place]) 12/23/2017  . Colitis   . Colon polyps   . Diabetes mellitus type 2, diet-controlled ([place]) 12/22/2013  . Diverticulosis   . GERD (gastroesophageal reflux disease)   . GI bleed   . GI bleed   . Hemorrhoids   . Hiatal hernia   . History of esophageal strciture   . HLD (hyperlipidemia)   . Osteoarthritis   . Proctitis   . Status post dilation of esophageal narrowing   . Stroke [facility])     PAST SURGICAL  HISTORY: Past Surgical History:  Procedure Laterality Date  . ABDOMINAL HERNIA REPAIR    . BIOPSY  04/05/2019   Procedure: BIOPSY;  Surgeon: Thornton Gambino, MD;  Location: [place];  Service: Gastroenterology;;  . COLONOSCOPY     multiple  . COLONOSCOPY WITH PROPOFOL N/A 12/12/2016   Procedure: COLONOSCOPY WITH PROPOFOL;  Surgeon: Gatha Mayer, MD;  Location: [facility] ENDOSCOPY;  Service: Endoscopy;  Laterality: N/A;  . COLONOSCOPY WITH PROPOFOL N/A 04/04/2019   Procedure: COLONOSCOPY WITH PROPOFOL;  Surgeon: Milus Banister, MD;  Location: [facility] ENDOSCOPY;  Service: Endoscopy;  Laterality: N/A;  . ENTEROSCOPY N/A 01/16/2020   Procedure: ENTEROSCOPY;  Surgeon: Rush Landmark Telford Nab., MD;  Location: [facility] ENDOSCOPY;  Service: Gastroenterology;  Laterality: N/A;  . ENTEROSCOPY N/A 01/20/2020   Procedure: ENTEROSCOPY;  Surgeon: Rush Landmark Telford Nab., MD;  Location: [facility] ENDOSCOPY;  Service: Gastroenterology;  Laterality: N/A;  . ESOPHAGOGASTRODUODENOSCOPY     multiple  . ESOPHAGOGASTRODUODENOSCOPY (EGD) WITH PROPOFOL N/A 04/05/2019   Procedure: ESOPHAGOGASTRODUODENOSCOPY (EGD) WITH PROPOFOL;  Surgeon: Thornton Belleau, MD;  Location: [place];  Service: Gastroenterology;  Laterality: N/A;  . ESOPHAGOGASTRODUODENOSCOPY (EGD) WITH PROPOFOL N/A 01/18/2020   Procedure: ESOPHAGOGASTRODUODENOSCOPY (EGD) WITH PROPOFOL;  Surgeon: Doran Stabler, MD;  Location: [facility] ENDOSCOPY;  Service: Gastroenterology;  Laterality: N/A;  For placement of video capsule study  . GIVENS CAPSULE STUDY N/A 01/16/2020   Procedure: GIVENS CAPSULE STUDY;  Surgeon: Irving Copas., MD;  Location: [facility] ENDOSCOPY;  Service: Gastroenterology;  Laterality: N/A;  . GIVENS CAPSULE STUDY N/A 01/18/2020   Procedure: GIVENS CAPSULE STUDY;  Surgeon: Doran Stabler, MD;  Location: [facility] ENDOSCOPY;  Service: Gastroenterology;  Laterality: N/A;  . HOT HEMOSTASIS N/A 01/20/2020   Procedure: HOT HEMOSTASIS (ARGON PLASMA  COAGULATION/BICAP);  Surgeon: Irving Copas., MD;  Location: [facility] ENDOSCOPY;  Service: Gastroenterology;  Laterality: N/A;  . INSERTION OF MESH  01/29/2012   Procedure: INSERTION OF MESH;  Surgeon: Gwenyth Ober, MD;  Location: [place];  Service: General;  Laterality: N/A;  . IR ANGIOGRAM SELECTIVE EACH ADDITIONAL VESSEL  04/01/2019  . IR ANGIOGRAM VISCERAL SELECTIVE  04/01/2019  . IR ANGIOGRAM VISCERAL SELECTIVE  04/01/2019  . IR ANGIOGRAM VISCERAL SELECTIVE  04/01/2019  . IR US GUIDE VASC ACCESS RIGHT  04/01/2019  . NISSEN FUNDOPLICATION    . SAVORY DILATION N/A 01/16/2020   Procedure: SAVORY DILATION;  Surgeon: Rush Landmark Telford Nab., MD;  Location: [facility] ENDOSCOPY;  Service: Gastroenterology;  Laterality: N/A;  . SCLEROTHERAPY  01/20/2020   Procedure: SCLEROTHERAPY;  Surgeon: Rush Landmark Telford Nab., MD;  Location: [facility] ENDOSCOPY;  Service: Gastroenterology;;  . SPLENECTOMY, TOTAL     nontraumatic rupture  . SUBMUCOSAL TATTOO INJECTION  01/16/2020   Procedure: SUBMUCOSAL TATTOO INJECTION;  Surgeon: Irving Copas., MD;  Location: [facility] ENDOSCOPY;  Service: Gastroenterology;;  . VENTRAL HERNIA REPAIR  01/29/2012    WITH MESH  . VENTRAL HERNIA REPAIR  01/29/2012   Procedure: HERNIA REPAIR VENTRAL ADULT;  Surgeon: Gwenyth Ober, MD;  Location: [place];  Service: General;  Laterality: N/A;  open recurrent ventral hernia repair with mesh    FAMILY HISTORY: Family History  Problem Relation Age of Onset  . Alzheimer's disease Mother   . Breast cancer Mother   . Throat cancer Father   . Colon cancer Neg Hx   . Colon polyps Neg Hx   . Pancreatic cancer Neg Hx   . Stomach cancer Neg Hx   . Liver disease Neg Hx     SOCIAL HISTORY: Social History   Socioeconomic History  . Marital status: Married    Spouse name: [name]  . Number of children: 2  . Years of education: Bachelor's  . Highest education level: Not on file  Occupational History  . Occupation: retired  Tobacco Use  .  Smoking status: Never Smoker  . Smokeless tobacco: Never Used  Substance and Sexual Activity  . Alcohol use: Yes    Alcohol/week: 2.0 standard drinks    Types: 2 Glasses of wine per week    Comment: daily  . Drug use: No  . Sexual activity: Not on file  Other Topics Concern  . Not on file  Social History Narrative   Patient is married and has 2 children.   Patient is right handed.   Patient has Bachelor's degree.   Patient drinks 2 cups daily.   Social Determinants of Health   Financial Resource Strain: Not on file  Food Insecurity: Not on file  Transportation Needs: Not on file  Physical Activity: Not on file  Stress: Not on file  Social Connections: Not on file  Intimate Partner Violence: Not on file     PHYSICAL EXAM  Vitals:   04/10/20 1459  BP: (!) 145/87  Pulse: 79  Weight: 154 lb (69.9 kg)  Height: 5\' 10"  (1.778 m)   Body mass index is 22.1 kg/m.  Generalized: frail very pleasant elderly Caucasian male, in no acute distress  Head: normocephalic and atraumatic,.   Neck: Supple, no carotid bruits  Cardiac: Regular rate rhythm, no murmur  Musculoskeletal: No deformity . Mild kyphosis  Neurological examination   Mentation: Alert oriented to time, place, history taking. Attention span and concentration appropriate. Recent and remote memory intact.  Follows all commands.  Speech and language fluent. Diminished recall - stable Cranial nerve II-XII: Pupils were equal round reactive to light extraocular movements were full, visual field were full on confrontational test. Facial sensation and strength were normal. hearing was intact to finger rubbing bilaterally. Uvula tongue midline. head turning and shoulder shrug were normal and symmetric.Tongue protrusion into cheek strength was normal. Motor: full strength right upper and lower extremity with normal bulk and tone, Mild LUE grip weakness and intrinsic hand muscles. Orbits right arm over left.  Fine finger movements  are diminished on the left. LLE 4+/5 hip flexor and 4/5 ankle dorsiflexion weakness  Sensory: normal and symmetric to light touch, in the upper and lower extremities Coordination: finger-nose-finger performed accurately on right side Reflexes: Brisker in the left upper and lower, plantar responses were flexor bilaterally. Gait and Station: Rising up from seated position with push off. uses a wheeled walker with a steppage type gait   DIAGNOSTIC DATA (LABS, IMAGING, TESTING) - I reviewed patient records, labs, notes, testing and imaging myself where available.  Lab Results  Component Value Date   WBC 10.0 03/19/2020   HGB 15.0 03/19/2020   HCT 46.0 03/19/2020   MCV 93.9 03/19/2020   PLT 311.0 03/19/2020      Component Value Date/Time   NA 138 01/21/2020 0448   K 3.9 01/21/2020 0448   CL 104 01/21/2020 0448   CO2 25 01/21/2020 0448   GLUCOSE 101 (H) 01/21/2020 0448   BUN 12 01/21/2020 0448   CREATININE 0.79 01/21/2020 0448   CREATININE 0.74 02/08/2012 1209   CALCIUM 8.2 (L) 01/21/2020 0448   PROT 4.5 (L) 01/17/2020 0515   ALBUMIN 2.8 (L) 01/17/2020 0515   AST 17 01/17/2020 0515   ALT 13 01/17/2020 0515   ALKPHOS 45 01/17/2020 0515   BILITOT 0.9 01/17/2020 0515   GFRNONAA >60 01/21/2020 0448   GFRAA >60 10/13/2019 0536   Lab Results  Component Value Date   CHOL 111 04/24/2018   HDL 44 04/24/2018   LDLCALC 54 04/24/2018   TRIG 65 04/24/2018   CHOLHDL 2.5 04/24/2018   Lab Results  Component Value Date   HGBA1C 5.8 (H) 01/06/2020     ASSESSMENT AND PLAN  87 y.o. year old male  has a past medical history significant for multiple prior strokes/TIAs (2020 slurred speech ?TIA, R PLIC stroke 5009, L occipital stroke 2017, possible TIAs vs sz's with recurrent slurred speech and 2010, 2015 x3 episodes, 2018, 2020), HTN, HLD, DM, RLS on Requip, recurrent GI bleeds with AVM of small bowel, combined systolic and diastolic CHF and BPPV  1.  Recurrent slurred speech  -Possible  TIA vs seizure or complicated migraine although lower suspicion  -similar to prior events lasting 20 to 30 minutes without any other associated symptoms or stroke type symptoms  -Multiple recurrent GI bleeds on Plavix and has transition to aspirin 81 mg daily since 12/2019  -Recommend trialing increased dose to aspirin 325 mg daily if okay with GI.  I do not believe that benefit would outweigh risk with transitioning back to Plavix at this present time  -Discussed further evaluation such as brain MRI but has had prior work-up for these similar events without abnormal findings.  Unable to appreciate new focal deficits on today's exam and completely back to baseline.  After discussion, it was agreed upon to hold off on imaging as the results would not change our current treatment plan.  He was advised to call 911 immediately with any prolonged events  or new stroke/TIA symptoms  2.  History of multiple strokes/TIAs  -See above otherwise continue current treatment plan  3.  RLS  -Was not discussed    Follow-up in September as scheduled or call earlier if needed   CC:  [place] provider: Dr. Sedonia Small, MD  (PCP) Owens Loffler, MD (GI)   I spent 30 minutes of face-to-face and non-face-to-face time with patient and wife.  This included previsit chart review, lab review, study review, order entry, electronic health record documentation, patient education and discussion regarding recurrent slurred speech and possible etiologies, history of multiple strokes/TIAs and importance of continued stroke risk factor management and answered all other questions to patient and wife satisfaction  Frann Rider, AGNP-BC  [facility] [address] [place],  [address]  Phone [phone] Fax [phone] Note: This document was prepared with digital dictation and possible smart phrase technology. Any transcriptional errors that result from this process are  unintentional.

## 2020-04-11 ENCOUNTER — Telehealth: Payer: Self-pay | Admitting: Gastroenterology

## 2020-04-11 NOTE — Telephone Encounter (Signed)
The pt called to report he had TIA Saturday.  He saw neuro yesterday who stopped baby ASA 81 mg to ASA 325 mg. Has been off plavix for 2 months.  He wants to make sure it ok from a GI stand point.  He is taking protonix 40 mg daily.   Please advise

## 2020-04-11 NOTE — Telephone Encounter (Signed)
Let him know I am OK with that change. I was contacted by his neuro team already.    Thanks

## 2020-04-11 NOTE — Telephone Encounter (Signed)
The pt has been advised.  He thanked me for the quick response.

## 2020-04-11 NOTE — Telephone Encounter (Signed)
Pt would like to update you on his visit to neurology. Pt states that he will be available until 2:00pm.

## 2020-04-16 NOTE — Progress Notes (Signed)
I agree with the above plan 

## 2020-04-18 DIAGNOSIS — M170 Bilateral primary osteoarthritis of knee: Secondary | ICD-10-CM | POA: Diagnosis not present

## 2020-04-18 DIAGNOSIS — H1031 Unspecified acute conjunctivitis, right eye: Secondary | ICD-10-CM | POA: Diagnosis not present

## 2020-04-22 ENCOUNTER — Telehealth: Payer: Self-pay | Admitting: Gastroenterology

## 2020-04-22 MED ORDER — PANTOPRAZOLE SODIUM 40 MG PO TBEC
40.0000 mg | DELAYED_RELEASE_TABLET | Freq: Every day | ORAL | 3 refills | Status: DC
Start: 2020-04-22 — End: 2021-02-04

## 2020-04-22 NOTE — Telephone Encounter (Signed)
Patient calling to get refill on Protonix

## 2020-04-22 NOTE — Telephone Encounter (Signed)
Rx for pantoprazole sent to pharmacy as requested.  

## 2020-04-29 DIAGNOSIS — E7800 Pure hypercholesterolemia, unspecified: Secondary | ICD-10-CM | POA: Diagnosis not present

## 2020-04-29 DIAGNOSIS — I6789 Other cerebrovascular disease: Secondary | ICD-10-CM | POA: Diagnosis not present

## 2020-04-29 DIAGNOSIS — I639 Cerebral infarction, unspecified: Secondary | ICD-10-CM | POA: Diagnosis not present

## 2020-04-29 DIAGNOSIS — I10 Essential (primary) hypertension: Secondary | ICD-10-CM | POA: Diagnosis not present

## 2020-04-29 DIAGNOSIS — J45909 Unspecified asthma, uncomplicated: Secondary | ICD-10-CM | POA: Diagnosis not present

## 2020-04-29 DIAGNOSIS — J4541 Moderate persistent asthma with (acute) exacerbation: Secondary | ICD-10-CM | POA: Diagnosis not present

## 2020-04-29 DIAGNOSIS — G4700 Insomnia, unspecified: Secondary | ICD-10-CM | POA: Diagnosis not present

## 2020-04-29 DIAGNOSIS — E119 Type 2 diabetes mellitus without complications: Secondary | ICD-10-CM | POA: Diagnosis not present

## 2020-04-29 DIAGNOSIS — G459 Transient cerebral ischemic attack, unspecified: Secondary | ICD-10-CM | POA: Diagnosis not present

## 2020-05-09 DIAGNOSIS — H0102A Squamous blepharitis right eye, upper and lower eyelids: Secondary | ICD-10-CM | POA: Diagnosis not present

## 2020-05-09 DIAGNOSIS — H0102B Squamous blepharitis left eye, upper and lower eyelids: Secondary | ICD-10-CM | POA: Diagnosis not present

## 2020-05-28 ENCOUNTER — Telehealth: Payer: Self-pay | Admitting: Adult Health

## 2020-05-28 NOTE — Telephone Encounter (Signed)
Pt is asking for a call to discuss TIA that he had on Saturday.  Pt states he did not go to the ED.  Pt states if he is not available when calling back he will be home a little after 4 pm.  A voicemail can be left.

## 2020-05-28 NOTE — Telephone Encounter (Signed)
Pt returned phone call. Would like a call from the nurse.

## 2020-05-28 NOTE — Telephone Encounter (Signed)
I called pt [name] for him that called.

## 2020-05-29 NOTE — Telephone Encounter (Signed)
I called pt back relayed JM/NP note.  He verbalized understanding.  I relayed that if prolonged episodes or back to back, then call 911 or go to ED.  He verbalized understanding.  Will monitor for now.

## 2020-05-29 NOTE — Telephone Encounter (Signed)
He has hx of TIA's previously with similar symptoms with multiple times of prior work up which has been unremarkable. Recommend to continue to monitor for now.  Also important to ensure he is routinely being seen by PCP to ensure adequate control over stroke risk factors

## 2020-05-29 NOTE — Telephone Encounter (Signed)
I called pt, he was informing us that he had TIA episode on Saturday 05-25-20 at 2130 (felt fuzzy, funny, not speaking clearly, not coordinated.  RN at [facility] saw him took Bp elevated. Lasted for about 20 minutes.  Taking 325mg  aspirin. He did not go to ED.  I reiterated from last OV to seek ED for TIA sx.  He was aware. Wanted [place] to know, keep [place] informed.  I will let JM/NP know and call back relating to recommendations.

## 2020-05-30 DIAGNOSIS — H6120 Impacted cerumen, unspecified ear: Secondary | ICD-10-CM | POA: Diagnosis not present

## 2020-06-03 DIAGNOSIS — M19011 Primary osteoarthritis, right shoulder: Secondary | ICD-10-CM | POA: Diagnosis not present

## 2020-06-03 DIAGNOSIS — M19012 Primary osteoarthritis, left shoulder: Secondary | ICD-10-CM | POA: Diagnosis not present

## 2020-06-12 DIAGNOSIS — M7742 Metatarsalgia, left foot: Secondary | ICD-10-CM | POA: Diagnosis not present

## 2020-07-16 DIAGNOSIS — J4541 Moderate persistent asthma with (acute) exacerbation: Secondary | ICD-10-CM | POA: Diagnosis not present

## 2020-07-16 DIAGNOSIS — E7800 Pure hypercholesterolemia, unspecified: Secondary | ICD-10-CM | POA: Diagnosis not present

## 2020-07-16 DIAGNOSIS — J45909 Unspecified asthma, uncomplicated: Secondary | ICD-10-CM | POA: Diagnosis not present

## 2020-07-16 DIAGNOSIS — E119 Type 2 diabetes mellitus without complications: Secondary | ICD-10-CM | POA: Diagnosis not present

## 2020-07-16 DIAGNOSIS — I639 Cerebral infarction, unspecified: Secondary | ICD-10-CM | POA: Diagnosis not present

## 2020-07-16 DIAGNOSIS — G4700 Insomnia, unspecified: Secondary | ICD-10-CM | POA: Diagnosis not present

## 2020-07-16 DIAGNOSIS — G459 Transient cerebral ischemic attack, unspecified: Secondary | ICD-10-CM | POA: Diagnosis not present

## 2020-07-16 DIAGNOSIS — I6789 Other cerebrovascular disease: Secondary | ICD-10-CM | POA: Diagnosis not present

## 2020-07-16 DIAGNOSIS — I10 Essential (primary) hypertension: Secondary | ICD-10-CM | POA: Diagnosis not present

## 2020-07-29 DIAGNOSIS — M1712 Unilateral primary osteoarthritis, left knee: Secondary | ICD-10-CM | POA: Diagnosis not present

## 2020-07-29 DIAGNOSIS — M170 Bilateral primary osteoarthritis of knee: Secondary | ICD-10-CM | POA: Diagnosis not present

## 2020-08-18 ENCOUNTER — Emergency Department (HOSPITAL_COMMUNITY): Payer: Medicare Other

## 2020-08-18 ENCOUNTER — Encounter (HOSPITAL_COMMUNITY): Payer: Self-pay | Admitting: Internal Medicine

## 2020-08-18 ENCOUNTER — Other Ambulatory Visit: Payer: Self-pay

## 2020-08-18 ENCOUNTER — Telehealth: Payer: Self-pay | Admitting: Physician Assistant

## 2020-08-18 ENCOUNTER — Inpatient Hospital Stay (HOSPITAL_COMMUNITY)
Admission: EM | Admit: 2020-08-18 | Discharge: 2020-08-20 | DRG: 378 | Disposition: A | Discharge status: Home / Self Care | Payer: Medicare Other | Attending: Internal Medicine | Admitting: Internal Medicine

## 2020-08-18 DIAGNOSIS — D62 Acute posthemorrhagic anemia: Secondary | ICD-10-CM | POA: Diagnosis not present

## 2020-08-18 DIAGNOSIS — K5791 Diverticulosis of intestine, part unspecified, without perforation or abscess with bleeding: Secondary | ICD-10-CM | POA: Diagnosis not present

## 2020-08-18 DIAGNOSIS — I69354 Hemiplegia and hemiparesis following cerebral infarction affecting left non-dominant side: Secondary | ICD-10-CM | POA: Diagnosis not present

## 2020-08-18 DIAGNOSIS — Z7951 Long term (current) use of inhaled steroids: Secondary | ICD-10-CM | POA: Diagnosis not present

## 2020-08-18 DIAGNOSIS — K5731 Diverticulosis of large intestine without perforation or abscess with bleeding: Principal | ICD-10-CM | POA: Diagnosis present

## 2020-08-18 DIAGNOSIS — R109 Unspecified abdominal pain: Secondary | ICD-10-CM | POA: Diagnosis not present

## 2020-08-18 DIAGNOSIS — Z8673 Personal history of transient ischemic attack (TIA), and cerebral infarction without residual deficits: Secondary | ICD-10-CM

## 2020-08-18 DIAGNOSIS — I110 Hypertensive heart disease with heart failure: Secondary | ICD-10-CM | POA: Diagnosis not present

## 2020-08-18 DIAGNOSIS — R58 Hemorrhage, not elsewhere classified: Secondary | ICD-10-CM | POA: Diagnosis not present

## 2020-08-18 DIAGNOSIS — K219 Gastro-esophageal reflux disease without esophagitis: Secondary | ICD-10-CM | POA: Diagnosis not present

## 2020-08-18 DIAGNOSIS — Z743 Need for continuous supervision: Secondary | ICD-10-CM | POA: Diagnosis not present

## 2020-08-18 DIAGNOSIS — J45909 Unspecified asthma, uncomplicated: Secondary | ICD-10-CM | POA: Diagnosis not present

## 2020-08-18 DIAGNOSIS — Z7984 Long term (current) use of oral hypoglycemic drugs: Secondary | ICD-10-CM

## 2020-08-18 DIAGNOSIS — Z79899 Other long term (current) drug therapy: Secondary | ICD-10-CM

## 2020-08-18 DIAGNOSIS — I700 Atherosclerosis of aorta: Secondary | ICD-10-CM | POA: Diagnosis not present

## 2020-08-18 DIAGNOSIS — K5521 Angiodysplasia of colon with hemorrhage: Secondary | ICD-10-CM | POA: Diagnosis present

## 2020-08-18 DIAGNOSIS — Q433 Congenital malformations of intestinal fixation: Secondary | ICD-10-CM | POA: Diagnosis not present

## 2020-08-18 DIAGNOSIS — N281 Cyst of kidney, acquired: Secondary | ICD-10-CM | POA: Diagnosis not present

## 2020-08-18 DIAGNOSIS — G40919 Epilepsy, unspecified, intractable, without status epilepticus: Secondary | ICD-10-CM | POA: Diagnosis present

## 2020-08-18 DIAGNOSIS — I10 Essential (primary) hypertension: Secondary | ICD-10-CM | POA: Diagnosis not present

## 2020-08-18 DIAGNOSIS — E119 Type 2 diabetes mellitus without complications: Secondary | ICD-10-CM | POA: Diagnosis not present

## 2020-08-18 DIAGNOSIS — E785 Hyperlipidemia, unspecified: Secondary | ICD-10-CM | POA: Diagnosis not present

## 2020-08-18 DIAGNOSIS — K5790 Diverticulosis of intestine, part unspecified, without perforation or abscess without bleeding: Secondary | ICD-10-CM | POA: Diagnosis present

## 2020-08-18 DIAGNOSIS — Z20822 Contact with and (suspected) exposure to covid-19: Secondary | ICD-10-CM | POA: Diagnosis present

## 2020-08-18 DIAGNOSIS — I5042 Chronic combined systolic (congestive) and diastolic (congestive) heart failure: Secondary | ICD-10-CM | POA: Diagnosis present

## 2020-08-18 DIAGNOSIS — K922 Gastrointestinal hemorrhage, unspecified: Secondary | ICD-10-CM | POA: Diagnosis not present

## 2020-08-18 DIAGNOSIS — Z7982 Long term (current) use of aspirin: Secondary | ICD-10-CM

## 2020-08-18 DIAGNOSIS — Z91048 Other nonmedicinal substance allergy status: Secondary | ICD-10-CM

## 2020-08-18 DIAGNOSIS — K449 Diaphragmatic hernia without obstruction or gangrene: Secondary | ICD-10-CM | POA: Diagnosis not present

## 2020-08-18 DIAGNOSIS — G459 Transient cerebral ischemic attack, unspecified: Secondary | ICD-10-CM | POA: Diagnosis present

## 2020-08-18 LAB — BASIC METABOLIC PANEL
Anion gap: 5 (ref 5–15)
BUN: 12 mg/dL (ref 8–23)
CO2: 29 mmol/L (ref 22–32)
Calcium: 8.9 mg/dL (ref 8.9–10.3)
Chloride: 105 mmol/L (ref 98–111)
Creatinine, Ser: 0.64 mg/dL (ref 0.61–1.24)
GFR, Estimated: >60 mL/min (ref >60)
Glucose, Bld: 147 mg/dL — ABNORMAL HIGH (ref 70–99)
Potassium: 3.9 mmol/L (ref 3.5–5.1)
Sodium: 139 mmol/L (ref 135–145)

## 2020-08-18 LAB — CBC
HCT: 36.2 % — ABNORMAL LOW (ref 39.0–52.0)
Hemoglobin: 11.6 g/dL — ABNORMAL LOW (ref 13.0–17.0)
MCH: 32 pg (ref 26.0–34.0)
MCHC: 32 g/dL (ref 30.0–36.0)
MCV: 100 fL (ref 80.0–100.0)
Platelets: 232 10*3/uL (ref 150–400)
RBC: 3.62 MIL/uL — ABNORMAL LOW (ref 4.22–5.81)
RDW: 14.3 % (ref 11.5–15.5)
WBC: 8 10*3/uL (ref 4.0–10.5)
nRBC: 0 % (ref 0.0–0.2)

## 2020-08-18 LAB — CBC WITH DIFFERENTIAL/PLATELET
Abs Immature Granulocytes: 0.02 10*3/uL (ref 0.00–0.07)
Basophils Absolute: 0.1 10*3/uL (ref 0.0–0.1)
Basophils Relative: 1 %
Eosinophils Absolute: 0.6 10*3/uL — ABNORMAL HIGH (ref 0.0–0.5)
Eosinophils Relative: 7 %
HCT: 48.8 % (ref 39.0–52.0)
Hemoglobin: 15.7 g/dL (ref 13.0–17.0)
Immature Granulocytes: 0 %
Lymphocytes Relative: 13 %
Lymphs Abs: 1.1 10*3/uL (ref 0.7–4.0)
MCH: 31.9 pg (ref 26.0–34.0)
MCHC: 32.2 g/dL (ref 30.0–36.0)
MCV: 99.2 fL (ref 80.0–100.0)
Monocytes Absolute: 0.5 10*3/uL (ref 0.1–1.0)
Monocytes Relative: 6 %
Neutro Abs: 6.4 10*3/uL (ref 1.7–7.7)
Neutrophils Relative %: 73 %
Platelets: 325 10*3/uL (ref 150–400)
RBC: 4.92 MIL/uL (ref 4.22–5.81)
RDW: 14.3 % (ref 11.5–15.5)
WBC: 8.8 10*3/uL (ref 4.0–10.5)
nRBC: 0 % (ref 0.0–0.2)

## 2020-08-18 LAB — TYPE AND SCREEN
ABO/RH(D): O POS
Antibody Screen: NEGATIVE

## 2020-08-18 LAB — LIPASE, BLOOD: Lipase: 21 U/L (ref 11–51)

## 2020-08-18 LAB — PROTIME-INR
INR: 0.9 (ref 0.8–1.2)
Prothrombin Time: 12.2 seconds (ref 11.4–15.2)

## 2020-08-18 LAB — HEPATIC FUNCTION PANEL
ALT: 15 U/L (ref 0–44)
AST: 20 U/L (ref 15–41)
Albumin: 3.9 g/dL (ref 3.5–5.0)
Alkaline Phosphatase: 77 U/L (ref 38–126)
Bilirubin, Direct: 0.1 mg/dL (ref 0.0–0.2)
Indirect Bilirubin: 0.6 mg/dL (ref 0.3–0.9)
Total Bilirubin: 0.7 mg/dL (ref 0.3–1.2)
Total Protein: 6.9 g/dL (ref 6.5–8.1)

## 2020-08-18 LAB — RESP PANEL BY RT-PCR (FLU A&B, COVID) ARPGX2
Influenza A by PCR: NEGATIVE
Influenza B by PCR: NEGATIVE
SARS Coronavirus 2 by RT PCR: NEGATIVE

## 2020-08-18 LAB — HEMOGLOBIN AND HEMATOCRIT, BLOOD
HCT: 39.8 % (ref 39.0–52.0)
Hemoglobin: 12.5 g/dL — ABNORMAL LOW (ref 13.0–17.0)

## 2020-08-18 LAB — POC OCCULT BLOOD, ED: Fecal Occult Bld: POSITIVE — AB

## 2020-08-18 LAB — HEMOGLOBIN: Hemoglobin: 15.7 g/dL (ref 13.0–17.0)

## 2020-08-18 MED ORDER — ACETAMINOPHEN 650 MG RE SUPP: 650.0000 mg | Freq: Four times a day (QID) | RECTAL | Status: DC | PRN

## 2020-08-18 MED ORDER — ACETAMINOPHEN 325 MG PO TABS: 650.0000 mg | ORAL_TABLET | Freq: Four times a day (QID) | ORAL | Status: DC | PRN

## 2020-08-18 MED ORDER — HYDRALAZINE HCL 20 MG/ML IJ SOLN
10.0000 mg | Freq: Once | INTRAMUSCULAR | Status: AC
  Administered 2020-08-18: 10 mg via INTRAVENOUS
  Filled 2020-08-18: qty 1

## 2020-08-18 MED ORDER — DOXYLAMINE SUCCINATE (SLEEP) 25 MG PO TABS
25.0000 mg | ORAL_TABLET | Freq: Once | ORAL | Status: AC
  Administered 2020-08-19: 25 mg via ORAL
  Filled 2020-08-18: qty 1

## 2020-08-18 MED ORDER — ROPINIROLE HCL 1 MG PO TABS
3.0000 mg | ORAL_TABLET | Freq: Every day | ORAL | Status: DC
  Administered 2020-08-18 – 2020-08-19 (×2): 3 mg via ORAL
  Filled 2020-08-18 (×2): qty 3

## 2020-08-18 MED ORDER — BUDESONIDE 0.5 MG/2ML IN SUSP
2.0000 mL | Freq: Two times a day (BID) | RESPIRATORY_TRACT | Status: DC
  Administered 2020-08-18 – 2020-08-20 (×4): 0.5 mg via RESPIRATORY_TRACT
  Filled 2020-08-18 (×4): qty 2

## 2020-08-18 MED ORDER — HYDRALAZINE HCL 20 MG/ML IJ SOLN: 10.0000 mg | Freq: Four times a day (QID) | INTRAMUSCULAR | Status: DC | PRN

## 2020-08-18 MED ORDER — IOHEXOL 350 MG/ML SOLN
75.0000 mL | Freq: Once | INTRAVENOUS | Status: AC | PRN
  Administered 2020-08-18: 75 mL via INTRAVENOUS

## 2020-08-18 MED ORDER — POLYVINYL ALCOHOL 1.4 % OP SOLN
1.0000 [drp] | OPHTHALMIC | Status: DC | PRN
  Filled 2020-08-18: qty 15

## 2020-08-18 MED ORDER — ALBUTEROL SULFATE (2.5 MG/3ML) 0.083% IN NEBU: 3.0000 mL | INHALATION_SOLUTION | Freq: Four times a day (QID) | RESPIRATORY_TRACT | Status: DC | PRN

## 2020-08-18 MED ORDER — PANTOPRAZOLE SODIUM 40 MG PO TBEC
40.0000 mg | DELAYED_RELEASE_TABLET | Freq: Every day | ORAL | Status: DC
  Administered 2020-08-18 – 2020-08-20 (×3): 40 mg via ORAL
  Filled 2020-08-18 (×3): qty 1

## 2020-08-18 MED ORDER — PREDNISONE 5 MG PO TABS
5.0000 mg | ORAL_TABLET | Freq: Every day | ORAL | Status: DC
  Administered 2020-08-18 – 2020-08-20 (×3): 5 mg via ORAL
  Filled 2020-08-18 (×3): qty 1

## 2020-08-18 MED ORDER — LACTATED RINGERS IV SOLN: INTRAVENOUS | Status: AC

## 2020-08-18 MED ORDER — FLUTICASONE PROPIONATE 50 MCG/ACT NA SUSP
1.0000 | Freq: Every day | NASAL | Status: DC | PRN
  Filled 2020-08-18: qty 16

## 2020-08-18 MED ORDER — SIMVASTATIN 40 MG PO TABS
40.0000 mg | ORAL_TABLET | Freq: Every day | ORAL | Status: DC
  Administered 2020-08-18 – 2020-08-19 (×2): 40 mg via ORAL
  Filled 2020-08-18 (×2): qty 1

## 2020-08-18 MED ORDER — MELATONIN 5 MG PO TABS
5.0000 mg | ORAL_TABLET | Freq: Every day | ORAL | Status: DC
  Administered 2020-08-18 – 2020-08-19 (×2): 5 mg via ORAL
  Filled 2020-08-18 (×2): qty 1

## 2020-08-18 NOTE — ED Triage Notes (Signed)
PER EMS, Pt from [facility].  Hx upper GI bleed. 2 hours ago pt was having a Bm and noticed bright red blood. Happen in the past. Abd tender. HX Afib 146/80 H80  R16

## 2020-08-18 NOTE — H&P (Signed)
History and Physical        Hospital Admission Note Date: 08/18/2020  Patient name: [name] Medical record number: [id number] Date of birth: [birth year] Age: 87 y.o. Gender: male  PCP: Seward Carol, MD    Patient coming from: home (independent living facility at [facility])  I have reviewed all records in the [facility].    Chief Complaint:  Rectal bleeding x4 today  HPI: Patient is a 87 year old male with prior history of GI bleeds, history of CVA with mild left-sided weakness, on aspirin only, GERD, diabetes mellitus type 2, hyperlipidemia presented to ED with rectal bleeding.  Patient reported that he went to the bathroom earlier this morning around 9 AM and had a BM with large amount of bright red blood.  Subsequently he had another BM in an hour with bleeding.  Given his prior history of multiple GI bleeds presumed to be diverticular, patient decided to come to the ED.  He did have aspirin 325 mg this morning.  In ED he had 2 more episodes of rectal bleeding, the last 1 had only minimal amount of blood.  He denies any NSAID use, not on any anticoagulation. Patient had "gas pains" in the last 2- 3 days otherwise no abdominal pain, nausea or vomiting.  No dizziness, lightheadedness or syncopal episode.  No chest pain or shortness of breath.   ED work-up/course:  Temp 97.6, respiratory rate 15, pulse 84, BP 159/118  Initial hemoglobin 15.7, recheck hemoglobin after 3 hours was 11.6, FOBT positive  CT abdomen showed no acute findings in the abdomen pelvis, left colonic diverticulosis without diverticulitis, small hiatal hernia and aortic atherosclerosis   Review of Systems: Positives marked in 'bold' Constitutional: Denies fever, chills, diaphoresis, poor appetite and fatigue.  HEENT: Denies photophobia, eye pain, redness, hearing loss, ear pain,  congestion, sore throat, rhinorrhea, sneezing, mouth sores, trouble swallowing, neck pain, neck stiffness and tinnitus.   Respiratory: Denies SOB, DOE, cough, chest tightness,  and wheezing.   Cardiovascular: Denies chest pain, palpitations and leg swelling.  Gastrointestinal: Please see HPI Genitourinary: Denies dysuria, urgency, frequency, hematuria, flank pain and difficulty urinating.  Musculoskeletal: Denies myalgias, back pain, joint swelling, arthralgias and gait problem.  Skin: Denies pallor, rash and wound.  Neurological: Denies dizziness, seizures, syncope, weakness, light-headedness, numbness and headaches.  Has prior stroke with mild left-sided weakness. Hematological: Denies adenopathy. Easy bruising, personal or family bleeding history  Psychiatric/Behavioral: Denies suicidal ideation, mood changes, confusion, nervousness, sleep disturbance and agitation  Past Medical History: Past Medical History:  Diagnosis Date   Acute CVA (cerebrovascular accident) ([place]) 07/25/2015   Asthma    Benign essential HTN    Cerebral infarction due to embolism of left middle cerebral artery ([place]) 02/03/2014   Chronic combined systolic and diastolic CHF (congestive heart failure) ([place]) 12/23/2017   Colitis    Colon polyps    Diabetes mellitus type 2, diet-controlled ([place]) 12/22/2013   Diverticulosis    GERD (gastroesophageal reflux disease)    GI bleed    GI bleed    Hemorrhoids    Hiatal hernia    History of esophageal strciture    HLD (hyperlipidemia)  Osteoarthritis    Proctitis    Status post dilation of esophageal narrowing    Stroke [facility])     Past Surgical History:  Procedure Laterality Date   ABDOMINAL HERNIA REPAIR     BIOPSY  04/05/2019   Procedure: BIOPSY;  Surgeon: Thornton Salah, MD;  Location: [place];  Service: Gastroenterology;;   COLONOSCOPY     multiple   COLONOSCOPY WITH PROPOFOL N/A 12/12/2016   Procedure: COLONOSCOPY WITH PROPOFOL;  Surgeon: Gatha Mayer, MD;  Location: [place];  Service: Endoscopy;  Laterality: N/A;   COLONOSCOPY WITH PROPOFOL N/A 04/04/2019   Procedure: COLONOSCOPY WITH PROPOFOL;  Surgeon: Milus Banister, MD;  Location: [facility] ENDOSCOPY;  Service: Endoscopy;  Laterality: N/A;   ENTEROSCOPY N/A 01/16/2020   Procedure: ENTEROSCOPY;  Surgeon: Rush Landmark Telford Nab., MD;  Location: [facility] ENDOSCOPY;  Service: Gastroenterology;  Laterality: N/A;   ENTEROSCOPY N/A 01/20/2020   Procedure: ENTEROSCOPY;  Surgeon: Rush Landmark Telford Nab., MD;  Location: [facility] ENDOSCOPY;  Service: Gastroenterology;  Laterality: N/A;   ESOPHAGOGASTRODUODENOSCOPY     multiple   ESOPHAGOGASTRODUODENOSCOPY (EGD) WITH PROPOFOL N/A 04/05/2019   Procedure: ESOPHAGOGASTRODUODENOSCOPY (EGD) WITH PROPOFOL;  Surgeon: Thornton Campisi, MD;  Location: [place];  Service: Gastroenterology;  Laterality: N/A;   ESOPHAGOGASTRODUODENOSCOPY (EGD) WITH PROPOFOL N/A 01/18/2020   Procedure: ESOPHAGOGASTRODUODENOSCOPY (EGD) WITH PROPOFOL;  Surgeon: Doran Stabler, MD;  Location: [facility] ENDOSCOPY;  Service: Gastroenterology;  Laterality: N/A;  For placement of video capsule study   GIVENS CAPSULE STUDY N/A 01/16/2020   Procedure: [place];  Surgeon: Irving Copas., MD;  Location: [facility] ENDOSCOPY;  Service: Gastroenterology;  Laterality: N/A;   GIVENS CAPSULE STUDY N/A 01/18/2020   Procedure: GIVENS CAPSULE STUDY;  Surgeon: Doran Stabler, MD;  Location: [facility] ENDOSCOPY;  Service: Gastroenterology;  Laterality: N/A;   HOT HEMOSTASIS N/A 01/20/2020   Procedure: HOT HEMOSTASIS (ARGON PLASMA COAGULATION/BICAP);  Surgeon: Irving Copas., MD;  Location: [facility];  Service: Gastroenterology;  Laterality: N/A;   INSERTION OF MESH  01/29/2012   Procedure: INSERTION OF MESH;  Surgeon: Gwenyth Ober, MD;  Location: [place];  Service: General;  Laterality: N/A;   IR ANGIOGRAM SELECTIVE EACH ADDITIONAL VESSEL  04/01/2019   IR ANGIOGRAM VISCERAL SELECTIVE   04/01/2019   IR ANGIOGRAM VISCERAL SELECTIVE  04/01/2019   IR ANGIOGRAM VISCERAL SELECTIVE  04/01/2019   IR US GUIDE VASC ACCESS RIGHT  1/74/9449   NISSEN FUNDOPLICATION     SAVORY DILATION N/A 01/16/2020   Procedure: SAVORY DILATION;  Surgeon: Irving Copas., MD;  Location: [facility] ENDOSCOPY;  Service: Gastroenterology;  Laterality: N/A;   SCLEROTHERAPY  01/20/2020   Procedure: SCLEROTHERAPY;  Surgeon: Mansouraty, Telford Nab., MD;  Location: [facility];  Service: Gastroenterology;;   SPLENECTOMY, TOTAL     nontraumatic rupture   SUBMUCOSAL TATTOO INJECTION  01/16/2020   Procedure: SUBMUCOSAL TATTOO INJECTION;  Surgeon: Irving Copas., MD;  Location: [facility];  Service: Gastroenterology;;   VENTRAL HERNIA REPAIR  01/29/2012    WITH MESH   VENTRAL HERNIA REPAIR  01/29/2012   Procedure: HERNIA REPAIR VENTRAL ADULT;  Surgeon: Gwenyth Ober, MD;  Location: [place];  Service: General;  Laterality: N/A;  open recurrent ventral hernia repair with mesh    Medications: Prior to Admission medications   Medication Sig Start Date End Date Taking? Authorizing Provider  albuterol (PROVENTIL) (2.5 MG/3ML) 0.083% nebulizer solution Take 3 mLs by nebulization every 6 (six) hours as needed for wheezing or shortness of breath.  10/04/18  Yes [provider]  albuterol (VENTOLIN HFA) 108 (90 Base) MCG/ACT inhaler Inhale 2 puffs into the lungs every 6 (six) hours as needed for wheezing or shortness of breath.   Yes [provider]  aspirin EC 325 MG tablet Take 1 tablet (325 mg total) by mouth daily. 04/10/20  Yes McCue, Janett Billow, NP  budesonide (PULMICORT) 0.5 MG/2ML nebulizer solution Take 2 mLs by nebulization 2 (two) times daily. 09/01/18  Yes [provider]  Carboxymethylcellul-Glycerin (LUBRICATING EYE DROPS OP) Apply 1 drop to eye as needed (dry eyes, eye discomfort).   Yes [provider]  Cholecalciferol (VITAMIN D3 PO) Take 1 tablet by mouth daily.   Yes  [provider]  Cyanocobalamin (VITAMIN B-12 PO) Take 1 tablet by mouth daily.   Yes [provider]  diphenhydramine-acetaminophen (TYLENOL PM) 25-500 MG TABS tablet Take 1 tablet by mouth at bedtime.   Yes [provider]  ferrous sulfate 325 (65 FE) MG tablet Take 325 mg by mouth daily with breakfast.   Yes [provider]  fluticasone (FLONASE) 50 MCG/ACT nasal spray Place 1 spray into both nostrils daily as needed for allergies or rhinitis.  06/18/14  Yes [provider]  Melatonin 5 MG CAPS Take 5 mg by mouth at bedtime.   Yes [provider]  metFORMIN (GLUCOPHAGE) 500 MG tablet Take 500 mg by mouth daily. 08/23/19  Yes [provider]  Multiple Vitamins-Minerals (ZINC PO) Take 1 tablet by mouth daily.   Yes [provider]  pantoprazole (PROTONIX) 40 MG tablet Take 1 tablet (40 mg total) by mouth daily. 04/22/20  Yes Milus Banister, MD  polyethylene glycol [facility] / Floria Raveling) packet Take 17 g by mouth daily as needed for mild constipation (MIX AND DRINK).    Yes [provider]  predniSONE (DELTASONE) 10 MG tablet Take 5 mg by mouth daily.  07/11/19  Yes [provider]  psyllium (METAMUCIL) 58.6 % powder Take 1 packet by mouth daily as needed (for constipation- MIX AND DRINK).    Yes [provider]  rOPINIRole (REQUIP) 3 MG tablet Take 1 tablet (3 mg total) by mouth at bedtime. 10/26/19  Yes McCue, Janett Billow, NP  simvastatin (ZOCOR) 40 MG tablet Take 1 tablet (40 mg total) by mouth daily. Patient taking differently: Take 40 mg by mouth at bedtime. 02/26/16  Yes Amin, Jeanella Flattery, MD  traMADol (ULTRAM) 50 MG tablet Take 1 tablet (50 mg total) by mouth daily as needed for moderate pain. 01/22/20  Yes Lavina Hamman, MD  vitamin C (ASCORBIC ACID) 500 MG tablet Take 500 mg by mouth daily.   Yes [provider]    Allergies:   Allergies  Allergen Reactions   Tape Other (See Comments)     SKIN IS SENSITIVE; PLEASE USE PAPER TAPE; SKIN BRUISES AND TEARS EASILY!!    Social History:  reports that he has never smoked. He has never used smokeless tobacco. He reports current alcohol use of about 2.0 standard drinks of alcohol per week. He reports that he does not use drugs.  Family History: Family History  Problem Relation Age of Onset   Alzheimer's disease Mother    Breast cancer Mother    Throat cancer Father    Colon cancer Neg Hx    Colon polyps Neg Hx    Pancreatic cancer Neg Hx    Stomach cancer Neg Hx    Liver disease Neg Hx     Physical Exam:  Blood pressure (!) 139/96, pulse 99, temperature 98.7 F (37.1 C), temperature source Oral, resp. rate 18, height 5\' 10"  (1.778 m), weight 70.3 kg, SpO2 97 %. General: Alert, awake, oriented x3, in no acute distress. Eyes: pink conjunctiva,anicteric sclera, PERLA   HEENT: normocephalic, atraumatic, oropharynx clear Neck: supple, no goiter, no JVD CVS: Regular rate and rhythm, without murmurs, rubs or gallops. No LE edema Resp : Clear to auscultation bilaterally, no wheezing, rales or rhonchi. GI : Soft, nontender, nondistended, positive bowel sounds. No hepatomegaly. Musculoskeletal: No clubbing or cyanosis, positive pedal pulses. No contracture. ROM intact  Neuro: Grossly intact, strength 5/5 RUE, RLE, 4/5 LUE, LLE (chronic from prior stroke) Psych: alert and oriented x 3, normal mood and affect Skin: no rashes or lesions, warm and dry   LABS on Admission: I have personally reviewed all the labs and imagings below    Basic Metabolic Panel: Recent Labs  Lab 08/18/20 1110  NA 139  K 3.9  CL 105  CO2 29  GLUCOSE 147*  BUN 12  CREATININE 0.64  CALCIUM 8.9   Liver Function Tests: Recent Labs  Lab 08/18/20 1110  AST 20  ALT 15  ALKPHOS 77  BILITOT 0.7  PROT 6.9  ALBUMIN 3.9   Recent Labs  Lab 08/18/20 1110  LIPASE 21   No results for input(s): AMMONIA in the last 168 hours. CBC: Recent Labs  Lab  08/18/20 1110 08/18/20 1411 08/18/20 1530  WBC 8.8 8.0  --   NEUTROABS 6.4  --   --   HGB 15.7 11.6* 12.5*  HCT 48.8 36.2* 39.8  MCV 99.2 100.0  --   PLT 325 232  --    Cardiac Enzymes: No results for input(s): CKTOTAL, CKMB, CKMBINDEX, TROPONINI in the last 168 hours. BNP: Invalid input(s): POCBNP CBG: No results for input(s): GLUCAP in the last 168 hours.  Radiological Exams on Admission:  CT ABDOMEN PELVIS W CONTRAST  Result Date: 08/18/2020 CLINICAL DATA:  Bright red blood per rectum. Clinical concern for diverticulitis. EXAM: CT ABDOMEN AND PELVIS WITH CONTRAST TECHNIQUE: Multidetector CT imaging of the abdomen and pelvis was performed using the standard protocol following bolus administration of intravenous contrast. CONTRAST:  37mL OMNIPAQUE IOHEXOL 350 MG/ML SOLN COMPARISON:  10/12/2019 FINDINGS: Lower chest: Tiny right middle lobe perifissural nodule on image 3/series 4 is stable in the interval consistent with benign etiology. Hepatobiliary: No suspicious focal abnormality within the liver parenchyma. There is no evidence for gallstones, gallbladder wall thickening, or pericholecystic fluid. No intrahepatic or extrahepatic biliary dilation. Pancreas: No focal mass lesion. No dilatation of the main duct. No intraparenchymal cyst. No peripancreatic edema. Spleen: Splenectomy. Small soft tissue nodule just posterior to the stomach on 10/02 is stable in the interval. Adrenals/Urinary Tract: No adrenal nodule or mass. Small cyst noted both kidneys with no substantial change. No evidence for hydroureter. The urinary bladder appears normal for the degree of distention. Stomach/Bowel: Small hiatal hernia. Stomach otherwise unremarkable. Duodenum is normally positioned as is the ligament of Treitz. No small bowel wall thickening. No small bowel dilatation. The terminal ileum is normal. The appendix is not well visualized, but there is no edema or inflammation in the region of the cecum. Patient  has a redundant transverse colon with diverticular disease noted in the left colon. No evidence for diverticulitis. Colon is nondilated. Vascular/Lymphatic: There is abdominal aortic atherosclerosis without aneurysm. There is no gastrohepatic or hepatoduodenal ligament lymphadenopathy. No retroperitoneal or mesenteric lymphadenopathy. No pelvic sidewall lymphadenopathy. Reproductive: The  prostate gland and seminal vesicles are unremarkable. Other: No intraperitoneal free fluid. Musculoskeletal: No worrisome lytic or sclerotic osseous abnormality. IMPRESSION: 1. No acute findings in the abdomen or pelvis. Specifically, no findings to explain the patient's history of rectal bleeding. 2. Left colonic diverticulosis without diverticulitis. 3. Small hiatal hernia. 4. Aortic Atherosclerosis (ICD10-I70.0). Electronically Signed   By: Misty Stanley M.D.   On: 08/18/2020 14:03      EKG: Independently reviewed.  No new EKGs available   Assessment/Plan Principal Problem:   Acute blood loss anemia secondary to acute lower GI bleeding in the setting of recurrent GI bleeds with previous bleeds presumed to be diverticular, small bowel AVM - Per patient, last BM had minimal amount of bright red blood.  Presented with hemoglobin of 15.7, down to 11.6 in 4 hours.  FOBT positive -Admit to telemetry, obtain serial H&H.  Transfuse for hemoglobin less than 8 or symptomatic. -Hold aspirin, if patient has another episode of bleeding, will obtain tagged RBC scan -Clear liquid diet, continue PPI 40 mg daily, gentle hydration  -Last colonoscopy in 2018 had shown multiple left-sided diverticulosis   Active Problems:   Diabetes mellitus type II, non insulin dependent (HCC) -Hold metformin, obtain hemoglobin A1c in a.m. - will place on sliding scale insulin while inpatient    HLD (hyperlipidemia) -Continue statin    History of CVA (cerebrovascular accident), TIA -Previously was on Plavix which was discontinued due to  recurrent GI bleeding.  Now maintained on aspirin 325 mg daily - will hold aspirin for now, last dose this morning (patient and wife agreeable with holding aspirin)     Benign essential HTN -Currently BP elevated, not on any antihypertensives outpatient -Will place on IV hydralazine as needed with parameters -If continues to have elevated BP, will add Norvasc  History of Chronic combined systolic and diastolic CHF (congestive heart failure) (HCC) -Currently euvolemic, 2D echo in 02/2016 had shown EF of 45%, G1 DD -Placed on gentle hydration due to multiple episodes of GI bleeding, had dizziness and syncopal episode in the past due to GI bleeding     GERD (gastroesophageal reflux disease) -Continue PPI   DVT prophylaxis: SCDs  CODE STATUS: Full CODE STATUS, addressed with patient and his wife at bedside  Consults called: Gastroenterology, Dr. Silverio Decamp  Family Communication: Admission, patients condition and plan of care including tests being ordered have been discussed with the patient and wife who indicates understanding and agree with the plan and Code Status  Admission status:   The medical decision making on this patient was of high complexity and the patient is at high risk for clinical deterioration, therefore this is a level 3 admission.  Severity of Illness:      The appropriate patient status for this patient is INPATIENT. Inpatient status is judged to be reasonable and necessary in order to provide the required intensity of service to ensure the patient's safety. The patient's presenting symptoms, physical exam findings, and initial radiographic and laboratory data in the context of their chronic comorbidities is felt to place them at high risk for further clinical deterioration. Furthermore, it is not anticipated that the patient will be medically stable for discharge from the hospital within 2 midnights of admission. The following factors support the patient status of  inpatient.   " The patient's presenting symptoms include multiple episodes of GI bleeding " The worrisome physical exam findings include none " The initial radiographic and laboratory data are worrisome because of significant drop in hemoglobin  in 4 hours " The chronic co-morbidities include recurrent history of GI bleeding, diverticulosis/AVM, history of TIA/stroke, CHF   * I certify that at the point of admission it is my clinical judgment that the patient will require inpatient hospital care spanning beyond 2 midnights from the point of admission due to high intensity of service, high risk for further deterioration and high frequency of surveillance required.*   Time Spent on Admission: 70 minutes     Falesha Schommer M.D. [facility] 08/18/2020, 4:44 PM

## 2020-08-18 NOTE — Telephone Encounter (Signed)
Telephone call: 10:37 AM 08/18/2020  Patient's wife called on-call service this weekend to let us know that her husband [name] is headed to the [place] ER after having 2-3 episodes of bright red blood this morning in the toilet.  He has a long history of diverticular/question of AVM bleeds.  They believe this to be the same.  Thankfully they have not had an episode since December of last year and the patient is only on aspirin now not his Plavix.  Told her that when the patient gets to the ER if he needs to be evaluated further by [place] they will call and let us know.  She verbalized understanding.  Ellouise Newer, PA-C

## 2020-08-18 NOTE — ED Provider Notes (Signed)
[facility] DEPT Provider Note   CSN: [id number] Arrival date & time: 08/18/20  1042     History    [name] is a 87 y.o. male.  [name] is a 87 y.o. male with history of GI bleeding, hypertension, hyperlipidemia, stroke, CHF, diabetes, who presents to the emergency department from [facility] independent living for evaluation of bright red blood per rectum.  He noticed this 2 hours prior to arrival.  Also reports some abdominal tenderness.  Has had previous episodes of GI bleeding. On aspirin, but no other anticoagulation. Pt reports he is worried he is having another diverticular bleed, last episode in January. Followed by [facility].  No lightheadedness, syncope, chest pain, shortness of breath.  No fevers or chills.  No nausea or vomiting, no hematemesis.  The history is provided by the patient.      Past Medical History:  Diagnosis Date   Acute CVA (cerebrovascular accident) ([place]) 07/25/2015   Asthma    Benign essential HTN    Cerebral infarction due to embolism of left middle cerebral artery ([place]) 02/03/2014   Chronic combined systolic and diastolic CHF (congestive heart failure) ([place]) 12/23/2017   Colitis    Colon polyps    Diabetes mellitus type 2, diet-controlled ([place]) 12/22/2013   Diverticulosis    GERD (gastroesophageal reflux disease)    GI bleed    GI bleed    Hemorrhoids    Hiatal hernia    History of esophageal strciture    HLD (hyperlipidemia)    Osteoarthritis    Proctitis    Status post dilation of esophageal narrowing    Stroke [facility])     Patient Active Problem List   Diagnosis Date Noted   AVM (arteriovenous malformation) of small bowel, acquired with hemorrhage    Acute GI bleeding 01/06/2020   Protein-calorie malnutrition, severe ([place]) 04/01/2019   DNR (do not resuscitate) 04/01/2019   Rectal bleed 08/21/2018   Platelet inhibition due to Plavix    Rectal bleeding    Diverticulosis large intestine w/o  perforation or abscess w/bleeding    GERD (gastroesophageal reflux disease) 04/21/2018   Hematochezia    Hypokalemia 12/25/2017   Diverticulosis    Acute blood loss anemia 12/24/2017   Syncope due to orthostatic hypotension 12/23/2017   Chronic combined systolic and diastolic CHF (congestive heart failure) ([place]) 12/23/2017   Postural dizziness with presyncope 10/25/2016   GI bleed 10/25/2016   BPPV (benign paroxysmal positional vertigo) 10/25/2016   Lower GI bleed 10/24/2016   Syncope 10/24/2016   TIA (transient ischemic attack) 07/22/2016   Benign essential HTN    PAF (paroxysmal atrial fibrillation) ([facility])    Stroke-like symptom 02/22/2016   Leukocytosis 02/22/2016   Acute lower GI bleeding 02/01/2016   History of lower GI bleeding Dec 2017 02/01/2016   Chronic anticoagulation 2/2 history of CVA 01/31/2016   History of CVA (cerebrovascular accident) 01/31/2016   Sinusitis, chronic 12/12/2015   Cough variant asthma 10/31/2015   Insomnia 07/25/2015   HLD (hyperlipidemia) 02/03/2014   Ulnar neuropathy at elbow of right upper extremity 02/03/2014   Cervical radiculopathy 02/03/2014   Seizures ([place])    Diabetes mellitus type II, non insulin dependent ([place]) 12/22/2013   Recurrent ventral hernia 12/15/2011   History of esophageal strciture    Diverticulosis of colon with hemorrhage 06/24/2007    Past Surgical History:  Procedure Laterality Date   ABDOMINAL HERNIA REPAIR     BIOPSY  04/05/2019   Procedure:  BIOPSY;  Surgeon: Thornton Shontz, MD;  Location: [facility] ENDOSCOPY;  Service: Gastroenterology;;   COLONOSCOPY     multiple   COLONOSCOPY WITH PROPOFOL N/A 12/12/2016   Procedure: COLONOSCOPY WITH PROPOFOL;  Surgeon: Gatha Mayer, MD;  Location: [place];  Service: Endoscopy;  Laterality: N/A;   COLONOSCOPY WITH PROPOFOL N/A 04/04/2019   Procedure: COLONOSCOPY WITH PROPOFOL;  Surgeon: Milus Banister, MD;  Location: [facility] ENDOSCOPY;  Service: Endoscopy;  Laterality: N/A;    ENTEROSCOPY N/A 01/16/2020   Procedure: ENTEROSCOPY;  Surgeon: Rush Landmark Telford Nab., MD;  Location: [facility] ENDOSCOPY;  Service: Gastroenterology;  Laterality: N/A;   ENTEROSCOPY N/A 01/20/2020   Procedure: ENTEROSCOPY;  Surgeon: Rush Landmark Telford Nab., MD;  Location: [facility] ENDOSCOPY;  Service: Gastroenterology;  Laterality: N/A;   ESOPHAGOGASTRODUODENOSCOPY     multiple   ESOPHAGOGASTRODUODENOSCOPY (EGD) WITH PROPOFOL N/A 04/05/2019   Procedure: ESOPHAGOGASTRODUODENOSCOPY (EGD) WITH PROPOFOL;  Surgeon: Thornton Blake, MD;  Location: [place];  Service: Gastroenterology;  Laterality: N/A;   ESOPHAGOGASTRODUODENOSCOPY (EGD) WITH PROPOFOL N/A 01/18/2020   Procedure: ESOPHAGOGASTRODUODENOSCOPY (EGD) WITH PROPOFOL;  Surgeon: Doran Stabler, MD;  Location: [facility] ENDOSCOPY;  Service: Gastroenterology;  Laterality: N/A;  For placement of video capsule study   GIVENS CAPSULE STUDY N/A 01/16/2020   Procedure: [place];  Surgeon: Irving Copas., MD;  Location: [facility] ENDOSCOPY;  Service: Gastroenterology;  Laterality: N/A;   GIVENS CAPSULE STUDY N/A 01/18/2020   Procedure: GIVENS CAPSULE STUDY;  Surgeon: Doran Stabler, MD;  Location: [facility] ENDOSCOPY;  Service: Gastroenterology;  Laterality: N/A;   HOT HEMOSTASIS N/A 01/20/2020   Procedure: HOT HEMOSTASIS (ARGON PLASMA COAGULATION/BICAP);  Surgeon: Irving Copas., MD;  Location: [facility];  Service: Gastroenterology;  Laterality: N/A;   INSERTION OF MESH  01/29/2012   Procedure: INSERTION OF MESH;  Surgeon: Gwenyth Ober, MD;  Location: [place];  Service: General;  Laterality: N/A;   IR ANGIOGRAM SELECTIVE EACH ADDITIONAL VESSEL  04/01/2019   IR ANGIOGRAM VISCERAL SELECTIVE  04/01/2019   IR ANGIOGRAM VISCERAL SELECTIVE  04/01/2019   IR ANGIOGRAM VISCERAL SELECTIVE  04/01/2019   IR US GUIDE VASC ACCESS RIGHT  05/12/7122   NISSEN FUNDOPLICATION     SAVORY DILATION N/A 01/16/2020   Procedure: SAVORY DILATION;  Surgeon: Irving Copas., MD;  Location: [facility] ENDOSCOPY;  Service: Gastroenterology;  Laterality: N/A;   SCLEROTHERAPY  01/20/2020   Procedure: SCLEROTHERAPY;  Surgeon: Mansouraty, Telford Nab., MD;  Location: [facility] ENDOSCOPY;  Service: Gastroenterology;;   SPLENECTOMY, TOTAL     nontraumatic rupture   SUBMUCOSAL TATTOO INJECTION  01/16/2020   Procedure: SUBMUCOSAL TATTOO INJECTION;  Surgeon: Irving Copas., MD;  Location: [facility] ENDOSCOPY;  Service: Gastroenterology;;   VENTRAL HERNIA REPAIR  01/29/2012    WITH MESH   VENTRAL HERNIA REPAIR  01/29/2012   Procedure: HERNIA REPAIR VENTRAL ADULT;  Surgeon: Gwenyth Ober, MD;  Location: [place];  Service: General;  Laterality: N/A;  open recurrent ventral hernia repair with mesh       Family History  Problem Relation Age of Onset   Alzheimer's disease Mother    Breast cancer Mother    Throat cancer Father    Colon cancer Neg Hx    Colon polyps Neg Hx    Pancreatic cancer Neg Hx    Stomach cancer Neg Hx    Liver disease Neg Hx     Social History   Tobacco Use   Smoking status: Never   Smokeless tobacco: Never  Substance Use  Topics   Alcohol use: Yes    Alcohol/week: 2.0 standard drinks    Types: 2 Glasses of wine per week    Comment: daily   Drug use: No    Home Medications Prior to Admission medications   Medication Sig Start Date End Date Taking? Authorizing Provider  albuterol (PROVENTIL) (2.5 MG/3ML) 0.083% nebulizer solution Take 3 mLs by nebulization every 6 (six) hours as needed for wheezing or shortness of breath.  10/04/18  Yes [provider]  albuterol (VENTOLIN HFA) 108 (90 Base) MCG/ACT inhaler Inhale 2 puffs into the lungs every 6 (six) hours as needed for wheezing or shortness of breath.   Yes [provider]  aspirin EC 325 MG tablet Take 1 tablet (325 mg total) by mouth daily. 04/10/20  Yes McCue, Janett Billow, NP  budesonide (PULMICORT) 0.5 MG/2ML nebulizer solution Take 2 mLs by nebulization 2 (two) times daily.  09/01/18  Yes [provider]  Carboxymethylcellul-Glycerin (LUBRICATING EYE DROPS OP) Apply 1 drop to eye as needed (dry eyes, eye discomfort).   Yes [provider]  Cholecalciferol (VITAMIN D3 PO) Take 1 tablet by mouth daily.   Yes [provider]  Cyanocobalamin (VITAMIN B-12 PO) Take 1 tablet by mouth daily.   Yes [provider]  diphenhydramine-acetaminophen (TYLENOL PM) 25-500 MG TABS tablet Take 1 tablet by mouth at bedtime.   Yes [provider]  ferrous sulfate 325 (65 FE) MG tablet Take 325 mg by mouth daily with breakfast.   Yes [provider]  fluticasone (FLONASE) 50 MCG/ACT nasal spray Place 1 spray into both nostrils daily as needed for allergies or rhinitis.  06/18/14  Yes [provider]  Melatonin 5 MG CAPS Take 5 mg by mouth at bedtime.   Yes [provider]  metFORMIN (GLUCOPHAGE) 500 MG tablet Take 500 mg by mouth daily. 08/23/19  Yes [provider]  Multiple Vitamins-Minerals (ZINC PO) Take 1 tablet by mouth daily.   Yes [provider]  pantoprazole (PROTONIX) 40 MG tablet Take 1 tablet (40 mg total) by mouth daily. 04/22/20  Yes Milus Banister, MD  polyethylene glycol [facility] / Floria Raveling) packet Take 17 g by mouth daily as needed for mild constipation (MIX AND DRINK).    Yes [provider]  predniSONE (DELTASONE) 10 MG tablet Take 5 mg by mouth daily.  07/11/19  Yes [provider]  psyllium (METAMUCIL) 58.6 % powder Take 1 packet by mouth daily as needed (for constipation- MIX AND DRINK).    Yes [provider]  rOPINIRole (REQUIP) 3 MG tablet Take 1 tablet (3 mg total) by mouth at bedtime. 10/26/19  Yes McCue, Janett Billow, NP  simvastatin (ZOCOR) 40 MG tablet Take 1 tablet (40 mg total) by mouth daily. Patient taking differently: Take 40 mg by mouth at bedtime. 02/26/16  Yes Amin, Jeanella Flattery, MD  traMADol (ULTRAM) 50 MG tablet Take 1 tablet (50 mg total) by mouth  daily as needed for moderate pain. 01/22/20  Yes Lavina Hamman, MD  vitamin C (ASCORBIC ACID) 500 MG tablet Take 500 mg by mouth daily.   Yes [provider]    Allergies    Tape  Review of Systems   Review of Systems  Constitutional:  Negative for chills and fever.  HENT: Negative.    Respiratory:  Negative for cough and shortness of breath.   Cardiovascular:  Negative for chest pain.  Gastrointestinal:  Positive for abdominal pain and blood in stool. Negative for  constipation, diarrhea, nausea and vomiting.  Genitourinary:  Negative for dysuria and frequency.  Neurological:  Negative for dizziness, syncope and light-headedness.  All other systems reviewed and are negative.  Physical Exam Updated Vital Signs BP (!) 164/94 (BP Location: Left Arm)   Pulse 84   Temp 97.6 F (36.4 C) (Oral)   Resp 15   Ht 5\' 10"  (1.778 m)   Wt 70.3 kg   SpO2 98%   BMI 22.24 kg/m   Physical Exam Vitals and nursing note reviewed.  Constitutional:      General: He is not in acute distress.    Appearance: Normal appearance. He is well-developed. He is not ill-appearing or diaphoretic.     Comments: Well-appearing and in no distress  HENT:     Head: Normocephalic and atraumatic.  Eyes:     General:        Right eye: No discharge.        Left eye: No discharge.  Cardiovascular:     Rate and Rhythm: Normal rate and regular rhythm.     Pulses: Normal pulses.     Heart sounds: Normal heart sounds. No murmur heard.   No friction rub. No gallop.  Pulmonary:     Effort: Pulmonary effort is normal. No respiratory distress.     Breath sounds: Normal breath sounds. No wheezing or rales.     Comments: Respirations equal and unlabored, patient able to speak in full sentences, lungs clear to auscultation bilaterally  Abdominal:     General: Bowel sounds are normal. There is no distension.     Palpations: Abdomen is soft. There is no mass.     Tenderness: There is abdominal tenderness.  There is no guarding.     Comments: Abdomen is soft, nondistended, bowel sounds present throughout, there is mild tenderness across the lower abdomen that does not localize to 1 side.  No guarding or peritoneal signs.  Musculoskeletal:        General: No deformity.     Cervical back: Neck supple.     Right lower leg: No edema.     Left lower leg: No edema.  Skin:    General: Skin is warm and dry.     Capillary Refill: Capillary refill takes less than 2 seconds.  Neurological:     Mental Status: He is alert and oriented to person, place, and time.     Coordination: Coordination normal.     Comments: Speech is clear, able to follow commands Moves extremities without ataxia, coordination intact  Psychiatric:        Mood and Affect: Mood normal.        Behavior: Behavior normal.    ED Results / Procedures / Treatments   Labs (all labs ordered are listed, but only abnormal results are displayed) Labs Reviewed  BASIC METABOLIC PANEL - Abnormal; Notable for the following components:      Result Value   Glucose, Bld 147 (*)    All other components within normal limits  CBC WITH DIFFERENTIAL/PLATELET - Abnormal; Notable for the following components:   Eosinophils Absolute 0.6 (*)    All other components within normal limits  CBC - Abnormal; Notable for the following components:   RBC 3.62 (*)    Hemoglobin 11.6 (*)    HCT 36.2 (*)    All other components within normal limits  HEMOGLOBIN AND HEMATOCRIT, BLOOD - Abnormal; Notable for the following components:   Hemoglobin 12.5 (*)    All other  components within normal limits  POC OCCULT BLOOD, ED - Abnormal; Notable for the following components:   Fecal Occult Bld POSITIVE (*)    All other components within normal limits  RESP PANEL BY RT-PCR (FLU A&B, COVID) [id number]  PROTIME-INR  LIPASE, BLOOD  HEPATIC FUNCTION PANEL  HEMOGLOBIN  HEMOGLOBIN  HEMOGLOBIN A1C  TYPE AND SCREEN    EKG None  Radiology CT ABDOMEN PELVIS W  CONTRAST  Result Date: 08/18/2020 CLINICAL DATA:  Bright red blood per rectum. Clinical concern for diverticulitis. EXAM: CT ABDOMEN AND PELVIS WITH CONTRAST TECHNIQUE: Multidetector CT imaging of the abdomen and pelvis was performed using the standard protocol following bolus administration of intravenous contrast. CONTRAST:  66mL OMNIPAQUE IOHEXOL 350 MG/ML SOLN COMPARISON:  10/12/2019 FINDINGS: Lower chest: Tiny right middle lobe perifissural nodule on image 3/series 4 is stable in the interval consistent with benign etiology. Hepatobiliary: No suspicious focal abnormality within the liver parenchyma. There is no evidence for gallstones, gallbladder wall thickening, or pericholecystic fluid. No intrahepatic or extrahepatic biliary dilation. Pancreas: No focal mass lesion. No dilatation of the main duct. No intraparenchymal cyst. No peripancreatic edema. Spleen: Splenectomy. Small soft tissue nodule just posterior to the stomach on 10/02 is stable in the interval. Adrenals/Urinary Tract: No adrenal nodule or mass. Small cyst noted both kidneys with no substantial change. No evidence for hydroureter. The urinary bladder appears normal for the degree of distention. Stomach/Bowel: Small hiatal hernia. Stomach otherwise unremarkable. Duodenum is normally positioned as is the ligament of Treitz. No small bowel wall thickening. No small bowel dilatation. The terminal ileum is normal. The appendix is not well visualized, but there is no edema or inflammation in the region of the cecum. Patient has a redundant transverse colon with diverticular disease noted in the left colon. No evidence for diverticulitis. Colon is nondilated. Vascular/Lymphatic: There is abdominal aortic atherosclerosis without aneurysm. There is no gastrohepatic or hepatoduodenal ligament lymphadenopathy. No retroperitoneal or mesenteric lymphadenopathy. No pelvic sidewall lymphadenopathy. Reproductive: The prostate gland and seminal vesicles are  unremarkable. Other: No intraperitoneal free fluid. Musculoskeletal: No worrisome lytic or sclerotic osseous abnormality. IMPRESSION: 1. No acute findings in the abdomen or pelvis. Specifically, no findings to explain the patient's history of rectal bleeding. 2. Left colonic diverticulosis without diverticulitis. 3. Small hiatal hernia. 4. Aortic Atherosclerosis (ICD10-I70.0). Electronically Signed   By: Misty Stanley M.D.   On: 08/18/2020 14:03    Procedures Procedures   Medications Ordered in ED Medications  simvastatin (ZOCOR) tablet 40 mg (has no administration in time range)  predniSONE (DELTASONE) tablet 5 mg (5 mg Oral Given 08/18/20 1746)  pantoprazole (PROTONIX) EC tablet 40 mg (40 mg Oral Given 08/18/20 1746)  melatonin tablet 5 mg (has no administration in time range)  rOPINIRole (REQUIP) tablet 3 mg (has no administration in time range)  albuterol (PROVENTIL) (2.5 MG/3ML) 0.083% nebulizer solution 3 mL (has no administration in time range)  budesonide (PULMICORT) nebulizer solution 0.5 mg ( Nebulization Canceled Entry 08/18/20 2200)  fluticasone (FLONASE) 50 MCG/ACT nasal spray 1 spray (has no administration in time range)  polyvinyl alcohol (LIQUIFILM TEARS) 1.4 % ophthalmic solution 1 drop (has no administration in time range)  lactated ringers infusion ( Intravenous New Bag/Given 08/18/20 1702)  acetaminophen (TYLENOL) tablet 650 mg (has no administration in time range)    Or  acetaminophen (TYLENOL) suppository 650 mg (has no administration in time range)  hydrALAZINE (APRESOLINE) injection 10 mg (has no administration in time range)  iohexol (OMNIPAQUE) 350 MG/ML injection  75 mL (75 mLs Intravenous Contrast Given 08/18/20 1324)  hydrALAZINE (APRESOLINE) injection 10 mg (10 mg Intravenous Given 08/18/20 1746)    ED Course  I have reviewed the triage vital signs and the nursing notes.  Pertinent labs & imaging results that were available during my care of the patient were reviewed by  me and considered in my medical decision making (see chart for details).    MDM Rules/Calculators/A&P                         87 y.o. male presents to the ED with complaints of blood in stool, this involves an extensive number of treatment options, and is a complaint that carries with it a high risk of complications and morbidity.    On arrival pt is nontoxic, vitals significant for hypertension, otherwise WNL. Exam significant for dark red blood on rectal exam, mild lower abdominal tenderness  Additional history obtained from medical record. Previous records obtained and reviewed via EMR   Lab Tests:  I Ordered, reviewed, and interpreted labs, which included:  CBC: No leukocytosis, initial hemoglobin 15.7, recheck 3 hours later significantly decreased at 11.6 BMP: Glucose 147, no other electrolyte derangements LFTs: WNL Lipase: WNL Fecal occult: Positive  COVID screening: Negative  Imaging Studies ordered:  I ordered imaging studies which included CT abdomen pelvis, I independently visualized and interpreted imaging which showed no acute findings, left diverticulosis, no evidence of diverticulitis  ED Course:   Patient with gross dark red blood on rectal exam, significant drop in hemoglobin on recheck.  History of multiple diverticular bleeds and GI bleeding from AVM.  Will consult GI.  Patient remains hemodynamically stable.  Case discussed with PA Ellouise Newer and Dr. Silverio Decamp with low [facility], they will see patient in consult, medicine admission requested  I consulted Dr Tana Coast with [facility] hospitalist and discussed lab and imaging findings, she will see and admit the patient    Portions of this note were generated with Dragon dictation software. Dictation errors may occur despite best attempts at proofreading.   Final Clinical Impression(s) / ED Diagnoses Final diagnoses:  Acute GI bleeding    Rx / DC Orders ED Discharge Orders     None        Janet Berlin 08/18/20 2024    Wyvonnia Dusky, MD 08/19/20 [phone]

## 2020-08-18 NOTE — Consult Note (Addendum)
Consultation  Referring Provider: Dr. Langston Masker   Primary Care Physician:  Seward Carol, MD Primary Gastroenterologist: Dr. Ardis Hughs       Reason for Consultation: GI bleed            HPI:   [name] is a 87 y.o. male with a past medical history as listed below including stroke maintained on aspirin only, status post fundoplication, diabetes, hyperlipidemia and history of GI bleeding in the past which has been presumed diverticular, who presented to the ED this morning with a complaint of 2 episodes of hematochezia.    Today, the patient explains that this morning around 8:00 he went to have a bowel movement and saw a little bit of bright red blood, he had another one following that within the hour and decided come to the ER due to his history of GI bleeds in the past.  Tells me he has only been using aspirin.  Denies abdominal pain or other symptoms.    Denies fever, chills, weight loss, change in bowel habits, heartburn, reflux, nausea or vomiting.  ED course: CT abdomen pelvis with contrast showed no acute findings in the abdomen or pelvis, left colonic diverticulosis without diverticulitis, small hiatal hernia and aortic atherosclerosis; hemoglobin 15.7--> 11.6 at recheck 3 hours later, Hemoccult positive  GI history: 2018 colonoscopy Dr. Carlean Purl confirm multiple left-sided diverticulosis but gets, prep in the right side was poor, no acute blood in the colon 2009 esophageal ring dilated June 2009 colonoscopy with significant left-sided diverticulosis 2003 colonoscopy with polyps, not adenomatous  Bleeding history: 2009 did not require blood transfusion, 2018 hospitalized twice for [place] rectal bleeding presumed diverticular, hemoglobin nadir 6.8 (normal hemoglobin 14); underwent colonoscopy in 2018 with diverticulosis, chronic Plavix use thought to contribute; 01/2018 another episode of bleeding, received 2 units PRBCs, CT scan with no clear source, bleeding scan positive for  active GI bleeding "with right colon is a suspected source", follow-up angiogram was negative, nuc med Meckel scan was negative, repeat colonoscopy February 21 showed diverticulosis with dark stool throughout, EGD February 21 showed hiatal hernia some gastritis, received 5 units of blood that admission, last admission in December seem to be bleeding from an [facility] source (actively bleeding AVM on capsule Endo), underwent 3 endoscopic procedures with eventual cautery of nonbleeding small bowel AVMs by enteroscopy, also stop Plavix and exchanged for aspirin  Past Medical History:  Diagnosis Date   Acute CVA (cerebrovascular accident) ([place]) 07/25/2015   Asthma    Benign essential HTN    Cerebral infarction due to embolism of left middle cerebral artery ([place]) 02/03/2014   Chronic combined systolic and diastolic CHF (congestive heart failure) ([place]) 12/23/2017   Colitis    Colon polyps    Diabetes mellitus type 2, diet-controlled ([place]) 12/22/2013   Diverticulosis    GERD (gastroesophageal reflux disease)    GI bleed    GI bleed    Hemorrhoids    Hiatal hernia    History of esophageal strciture    HLD (hyperlipidemia)    Osteoarthritis    Proctitis    Status post dilation of esophageal narrowing    Stroke [facility])     Past Surgical History:  Procedure Laterality Date   ABDOMINAL HERNIA REPAIR     BIOPSY  04/05/2019   Procedure: BIOPSY;  Surgeon: Thornton Wiechmann, MD;  Location: [place];  Service: Gastroenterology;;   COLONOSCOPY     multiple   COLONOSCOPY WITH PROPOFOL N/A 12/12/2016   Procedure: COLONOSCOPY  WITH PROPOFOL;  Surgeon: Gatha Mayer, MD;  Location: [facility] ENDOSCOPY;  Service: Endoscopy;  Laterality: N/A;   COLONOSCOPY WITH PROPOFOL N/A 04/04/2019   Procedure: COLONOSCOPY WITH PROPOFOL;  Surgeon: Milus Banister, MD;  Location: [facility] ENDOSCOPY;  Service: Endoscopy;  Laterality: N/A;   ENTEROSCOPY N/A 01/16/2020   Procedure: ENTEROSCOPY;  Surgeon: Rush Landmark Telford Nab., MD;   Location: [facility] ENDOSCOPY;  Service: Gastroenterology;  Laterality: N/A;   ENTEROSCOPY N/A 01/20/2020   Procedure: ENTEROSCOPY;  Surgeon: Rush Landmark Telford Nab., MD;  Location: [facility] ENDOSCOPY;  Service: Gastroenterology;  Laterality: N/A;   ESOPHAGOGASTRODUODENOSCOPY     multiple   ESOPHAGOGASTRODUODENOSCOPY (EGD) WITH PROPOFOL N/A 04/05/2019   Procedure: ESOPHAGOGASTRODUODENOSCOPY (EGD) WITH PROPOFOL;  Surgeon: Thornton Kleckner, MD;  Location: [place];  Service: Gastroenterology;  Laterality: N/A;   ESOPHAGOGASTRODUODENOSCOPY (EGD) WITH PROPOFOL N/A 01/18/2020   Procedure: ESOPHAGOGASTRODUODENOSCOPY (EGD) WITH PROPOFOL;  Surgeon: Doran Stabler, MD;  Location: [facility] ENDOSCOPY;  Service: Gastroenterology;  Laterality: N/A;  For placement of video capsule study   GIVENS CAPSULE STUDY N/A 01/16/2020   Procedure: [place];  Surgeon: Irving Copas., MD;  Location: [facility] ENDOSCOPY;  Service: Gastroenterology;  Laterality: N/A;   GIVENS CAPSULE STUDY N/A 01/18/2020   Procedure: GIVENS CAPSULE STUDY;  Surgeon: Doran Stabler, MD;  Location: [facility] ENDOSCOPY;  Service: Gastroenterology;  Laterality: N/A;   HOT HEMOSTASIS N/A 01/20/2020   Procedure: HOT HEMOSTASIS (ARGON PLASMA COAGULATION/BICAP);  Surgeon: Irving Copas., MD;  Location: [facility] ENDOSCOPY;  Service: Gastroenterology;  Laterality: N/A;   INSERTION OF MESH  01/29/2012   Procedure: INSERTION OF MESH;  Surgeon: Gwenyth Ober, MD;  Location: [place];  Service: General;  Laterality: N/A;   IR ANGIOGRAM SELECTIVE EACH ADDITIONAL VESSEL  04/01/2019   IR ANGIOGRAM VISCERAL SELECTIVE  04/01/2019   IR ANGIOGRAM VISCERAL SELECTIVE  04/01/2019   IR ANGIOGRAM VISCERAL SELECTIVE  04/01/2019   IR US GUIDE VASC ACCESS RIGHT  06/19/5463   NISSEN FUNDOPLICATION     SAVORY DILATION N/A 01/16/2020   Procedure: SAVORY DILATION;  Surgeon: Irving Copas., MD;  Location: [facility] ENDOSCOPY;  Service: Gastroenterology;  Laterality: N/A;    SCLEROTHERAPY  01/20/2020   Procedure: SCLEROTHERAPY;  Surgeon: Mansouraty, Telford Nab., MD;  Location: [facility] ENDOSCOPY;  Service: Gastroenterology;;   SPLENECTOMY, TOTAL     nontraumatic rupture   SUBMUCOSAL TATTOO INJECTION  01/16/2020   Procedure: SUBMUCOSAL TATTOO INJECTION;  Surgeon: Irving Copas., MD;  Location: [facility] ENDOSCOPY;  Service: Gastroenterology;;   VENTRAL HERNIA REPAIR  01/29/2012    WITH MESH   VENTRAL HERNIA REPAIR  01/29/2012   Procedure: HERNIA REPAIR VENTRAL ADULT;  Surgeon: Gwenyth Ober, MD;  Location: [place];  Service: General;  Laterality: N/A;  open recurrent ventral hernia repair with mesh    Family History  Problem Relation Age of Onset   Alzheimer's disease Mother    Breast cancer Mother    Throat cancer Father    Colon cancer Neg Hx    Colon polyps Neg Hx    Pancreatic cancer Neg Hx    Stomach cancer Neg Hx    Liver disease Neg Hx     Social History   Tobacco Use   Smoking status: Never   Smokeless tobacco: Never  Substance Use Topics   Alcohol use: Yes    Alcohol/week: 2.0 standard drinks    Types: 2 Glasses of wine per week    Comment: daily   Drug use: No  Prior to Admission medications   Medication Sig Start Date End Date Taking? Authorizing Provider  albuterol (PROVENTIL) (2.5 MG/3ML) 0.083% nebulizer solution Take 3 mLs by nebulization every 6 (six) hours as needed for wheezing or shortness of breath.  10/04/18  Yes [provider]  albuterol (VENTOLIN HFA) 108 (90 Base) MCG/ACT inhaler Inhale 2 puffs into the lungs every 6 (six) hours as needed for wheezing or shortness of breath.   Yes [provider]  aspirin EC 325 MG tablet Take 1 tablet (325 mg total) by mouth daily. 04/10/20  Yes McCue, Janett Billow, NP  budesonide (PULMICORT) 0.5 MG/2ML nebulizer solution Take 2 mLs by nebulization 2 (two) times daily. 09/01/18  Yes [provider]  Carboxymethylcellul-Glycerin (LUBRICATING EYE DROPS OP) Apply 1 drop to  eye as needed (dry eyes, eye discomfort).   Yes [provider]  Cholecalciferol (VITAMIN D3 PO) Take 1 tablet by mouth daily.   Yes [provider]  Cyanocobalamin (VITAMIN B-12 PO) Take 1 tablet by mouth daily.   Yes [provider]  diphenhydramine-acetaminophen (TYLENOL PM) 25-500 MG TABS tablet Take 1 tablet by mouth at bedtime.   Yes [provider]  ferrous sulfate 325 (65 FE) MG tablet Take 325 mg by mouth daily with breakfast.   Yes [provider]  fluticasone (FLONASE) 50 MCG/ACT nasal spray Place 1 spray into both nostrils daily as needed for allergies or rhinitis.  06/18/14  Yes [provider]  Melatonin 5 MG CAPS Take 5 mg by mouth at bedtime.   Yes [provider]  metFORMIN (GLUCOPHAGE) 500 MG tablet Take 500 mg by mouth daily. 08/23/19  Yes [provider]  Multiple Vitamins-Minerals (ZINC PO) Take 1 tablet by mouth daily.   Yes [provider]  pantoprazole (PROTONIX) 40 MG tablet Take 1 tablet (40 mg total) by mouth daily. 04/22/20  Yes Milus Banister, MD  polyethylene glycol [facility] / Floria Raveling) packet Take 17 g by mouth daily as needed for mild constipation (MIX AND DRINK).    Yes [provider]  predniSONE (DELTASONE) 10 MG tablet Take 5 mg by mouth daily.  07/11/19  Yes [provider]  psyllium (METAMUCIL) 58.6 % powder Take 1 packet by mouth daily as needed (for constipation- MIX AND DRINK).    Yes [provider]  rOPINIRole (REQUIP) 3 MG tablet Take 1 tablet (3 mg total) by mouth at bedtime. 10/26/19  Yes McCue, Janett Billow, NP  simvastatin (ZOCOR) 40 MG tablet Take 1 tablet (40 mg total) by mouth daily. Patient taking differently: Take 40 mg by mouth at bedtime. 02/26/16  Yes Amin, Jeanella Flattery, MD  traMADol (ULTRAM) 50 MG tablet Take 1 tablet (50 mg total) by mouth daily as needed for moderate pain. 01/22/20  Yes Lavina Hamman, MD  vitamin C (ASCORBIC ACID) 500 MG tablet  Take 500 mg by mouth daily.   Yes [provider]    No current facility-administered medications for this encounter.   Current Outpatient Medications  Medication Sig Dispense Refill   albuterol (PROVENTIL) (2.5 MG/3ML) 0.083% nebulizer solution Take 3 mLs by nebulization every 6 (six) hours as needed for wheezing or shortness of breath.      albuterol (VENTOLIN HFA) 108 (90 Base) MCG/ACT inhaler Inhale 2 puffs into the lungs every 6 (six) hours as needed for wheezing or shortness of breath.     aspirin EC 325 MG tablet Take 1 tablet (325 mg total) by mouth daily. 30 tablet 11  budesonide (PULMICORT) 0.5 MG/2ML nebulizer solution Take 2 mLs by nebulization 2 (two) times daily.     Carboxymethylcellul-Glycerin (LUBRICATING EYE DROPS OP) Apply 1 drop to eye as needed (dry eyes, eye discomfort).     Cholecalciferol (VITAMIN D3 PO) Take 1 tablet by mouth daily.     Cyanocobalamin (VITAMIN B-12 PO) Take 1 tablet by mouth daily.     diphenhydramine-acetaminophen (TYLENOL PM) 25-500 MG TABS tablet Take 1 tablet by mouth at bedtime.     ferrous sulfate 325 (65 FE) MG tablet Take 325 mg by mouth daily with breakfast.     fluticasone (FLONASE) 50 MCG/ACT nasal spray Place 1 spray into both nostrils daily as needed for allergies or rhinitis.   5   Melatonin 5 MG CAPS Take 5 mg by mouth at bedtime.     metFORMIN (GLUCOPHAGE) 500 MG tablet Take 500 mg by mouth daily.     Multiple Vitamins-Minerals (ZINC PO) Take 1 tablet by mouth daily.     pantoprazole (PROTONIX) 40 MG tablet Take 1 tablet (40 mg total) by mouth daily. 90 tablet 3   polyethylene glycol (MIRALAX / GLYCOLAX) packet Take 17 g by mouth daily as needed for mild constipation (MIX AND DRINK).      predniSONE (DELTASONE) 10 MG tablet Take 5 mg by mouth daily.      psyllium (METAMUCIL) 58.6 % powder Take 1 packet by mouth daily as needed (for constipation- MIX AND DRINK).      rOPINIRole (REQUIP) 3 MG tablet Take 1 tablet (3 mg total)  by mouth at bedtime. 90 tablet 3   simvastatin (ZOCOR) 40 MG tablet Take 1 tablet (40 mg total) by mouth daily. (Patient taking differently: Take 40 mg by mouth at bedtime.) 30 tablet 3   traMADol (ULTRAM) 50 MG tablet Take 1 tablet (50 mg total) by mouth daily as needed for moderate pain. 3 tablet 0   vitamin C (ASCORBIC ACID) 500 MG tablet Take 500 mg by mouth daily.      Allergies as of 08/18/2020 - Review Complete 08/18/2020  Allergen Reaction Noted   Tape Other (See Comments) 02/04/2018     Review of Systems:    Constitutional: No weight loss, fever or chills Skin: No rash Cardiovascular: No chest pain Respiratory: No SOB  Gastrointestinal: See HPI and otherwise negative Genitourinary: No dysuria  Neurological: No headache, dizziness or syncope Musculoskeletal: No new muscle or joint pain Hematologic: No bruising Psychiatric: No history of depression or anxiety    Physical Exam:  Vital signs in last 24 hours: Temp:  [97.6 F (36.4 C)-98.7 F (37.1 C)] 98.7 F (37.1 C) (07/03 1521) Pulse Rate:  [84-91] 91 (07/03 1521) Resp:  [15-18] 16 (07/03 1521) BP: (131-164)/(74-118) 149/99 (07/03 1521) SpO2:  [95 %-98 %] 96 % (07/03 1521) Weight:  [70.3 kg] 70.3 kg (07/03 1059)   General:   Pleasant Elderly Caucasian male appears to be in NAD, Well developed, Well nourished, alert and cooperative Head:  Normocephalic and atraumatic. Eyes:   PEERL, EOMI. No icterus. Conjunctiva pink. Ears:  Normal auditory acuity. Neck:  Supple Throat: Oral cavity and pharynx without inflammation, swelling or lesion.  Lungs: Respirations even and unlabored. Lungs clear to auscultation bilaterally.   No wheezes, crackles, or rhonchi.  Heart: Normal S1, S2. No MRG. Regular rate and rhythm. No peripheral edema, cyanosis or pallor.  Abdomen:  Soft, nondistended, nontender. No rebound or guarding. Normal bowel sounds. No appreciable masses or hepatomegaly. Rectal:  dark blood per  ER physician Msk:   Symmetrical without gross deformities. Peripheral pulses intact.  Extremities:  Without edema, no deformity or joint abnormality.  Neurologic:  Alert and  oriented x4;  grossly normal neurologically.  Skin:   Dry and intact without significant lesions or rashes. Psychiatric: Demonstrates good judgement and reason without abnormal affect or behaviors.   LAB RESULTS: Recent Labs    08/18/20 1110 08/18/20 1411  WBC 8.8 8.0  HGB 15.7 11.6*  HCT 48.8 36.2*  PLT 325 232   BMET Recent Labs    08/18/20 1110  NA 139  K 3.9  CL 105  CO2 29  GLUCOSE 147*  BUN 12  CREATININE 0.64  CALCIUM 8.9   LFT Recent Labs    08/18/20 1110  PROT 6.9  ALBUMIN 3.9  AST 20  ALT 15  ALKPHOS 77  BILITOT 0.7  BILIDIR 0.1  IBILI 0.6   PT/INR Recent Labs    08/18/20 1110  LABPROT 12.2  INR 0.9    STUDIES: CT ABDOMEN PELVIS W CONTRAST  Result Date: 08/18/2020 CLINICAL DATA:  Bright red blood per rectum. Clinical concern for diverticulitis. EXAM: CT ABDOMEN AND PELVIS WITH CONTRAST TECHNIQUE: Multidetector CT imaging of the abdomen and pelvis was performed using the standard protocol following bolus administration of intravenous contrast. CONTRAST:  33mL OMNIPAQUE IOHEXOL 350 MG/ML SOLN COMPARISON:  10/12/2019 FINDINGS: Lower chest: Tiny right middle lobe perifissural nodule on image 3/series 4 is stable in the interval consistent with benign etiology. Hepatobiliary: No suspicious focal abnormality within the liver parenchyma. There is no evidence for gallstones, gallbladder wall thickening, or pericholecystic fluid. No intrahepatic or extrahepatic biliary dilation. Pancreas: No focal mass lesion. No dilatation of the main duct. No intraparenchymal cyst. No peripancreatic edema. Spleen: Splenectomy. Small soft tissue nodule just posterior to the stomach on 10/02 is stable in the interval. Adrenals/Urinary Tract: No adrenal nodule or mass. Small cyst noted both kidneys with no substantial change. No  evidence for hydroureter. The urinary bladder appears normal for the degree of distention. Stomach/Bowel: Small hiatal hernia. Stomach otherwise unremarkable. Duodenum is normally positioned as is the ligament of Treitz. No small bowel wall thickening. No small bowel dilatation. The terminal ileum is normal. The appendix is not well visualized, but there is no edema or inflammation in the region of the cecum. Patient has a redundant transverse colon with diverticular disease noted in the left colon. No evidence for diverticulitis. Colon is nondilated. Vascular/Lymphatic: There is abdominal aortic atherosclerosis without aneurysm. There is no gastrohepatic or hepatoduodenal ligament lymphadenopathy. No retroperitoneal or mesenteric lymphadenopathy. No pelvic sidewall lymphadenopathy. Reproductive: The prostate gland and seminal vesicles are unremarkable. Other: No intraperitoneal free fluid. Musculoskeletal: No worrisome lytic or sclerotic osseous abnormality. IMPRESSION: 1. No acute findings in the abdomen or pelvis. Specifically, no findings to explain the patient's history of rectal bleeding. 2. Left colonic diverticulosis without diverticulitis. 3. Small hiatal hernia. 4. Aortic Atherosclerosis (ICD10-I70.0). Electronically Signed   By: Misty Stanley M.D.   On: 08/18/2020 14:03     Impression / Plan:   Impression: 1.  Recurrent GI bleeding: Previous bleed thought to be diverticular, though during last admission in December it was thought possible from small bowel AVMs; consider other source of possibility 2.  Acute blood loss anemia from above: Hemoglobin 15.7--> 11.6 over the past 4 hours since being in the ER; from above  Plan: 1. Clear liquid diet 2. Tagged RBC scan if patient continues to bleed so that we can localize bleeding.  3.  Continue to monitor hemoglobin with transfusion as needed less than 7 4.  Continue Pantoprazole 40 mg daily 5.  Please await further recommendations Dr. Silverio Decamp later  today.  Thank you for your kind consultation, we will continue to follow.  Lavone Nian [facility]  08/18/2020, 3:42 PM   Attending physician's note   I have taken a history, examined the patient and reviewed the chart. I agree with the Advanced Practitioner's note, impression and recommendations.  87 year old very pleasant gentleman with history of CVA on aspirin admitted with rectal bleeding Likely etiology of rectal bleeding is diverticular hemorrhage  Hemoglobin has trended down to 12 from baseline 15 Hemodynamically stable  Will plan for nuclear med RBC tagged scan to localize the bleed if he has recurrent episode of large-volume hematochezia No plan for colonoscopy at this point Clear liquid diet Monitor hemoglobin twice daily and transfuse if below 7  GI will continue to follow along   The patient was provided an opportunity to ask questions and all were answered. The patient agreed with the plan and demonstrated an understanding of the instructions.  Damaris Hippo , MD [phone]

## 2020-08-18 NOTE — Progress Notes (Signed)
   08/18/20 2059  Assess: MEWS Score  Temp 98.3 F (36.8 C)  BP 121/67  Pulse Rate (!) 116  Resp 20  Level of Consciousness Alert  SpO2 96 %  O2 Device Room Air  Assess: MEWS Score  MEWS Temp 0  MEWS Systolic 0  MEWS Pulse 2  MEWS RR 0  MEWS LOC 0  MEWS Score 2  MEWS Score Color Yellow  Assess: if the MEWS score is Yellow or Red  Were vital signs taken at a resting state? Yes  Focused Assessment No change from prior assessment  Does the patient meet 2 or more of the SIRS criteria? No  MEWS guidelines implemented *See Row Information* Yes  Treat  MEWS Interventions Escalated (See documentation below)  Take Vital Signs  Increase Vital Sign Frequency  Yellow: Q 2hr X 2 then Q 4hr X 2, if remains yellow, continue Q 4hrs  Escalate  MEWS: Escalate Yellow: discuss with charge nurse/RN and consider discussing with provider and RRT  Notify: Charge Nurse/RN  Name of Charge Nurse/RN Notified Meredith, RN  Date Charge Nurse/RN Notified 08/18/20  Time Charge Nurse/RN Notified 2100  Assess: SIRS CRITERIA  SIRS Temperature  0  SIRS Pulse 1  SIRS Respirations  0  SIRS WBC 0  SIRS Score Sum  1  Pt. Remains alert and oriented. No distress noted, will continue to monitor pt closley. Neomia Dear, RN

## 2020-08-18 NOTE — Progress Notes (Signed)
[facility]   Briefly:  Stopped by to see the patient as he had called to alert [place] of his arrival. His hgb is very stable and he had only two episodes of small amount of hematochezia between the hours of 8-9 this morning. Reviewed case with Dr. Silverio Decamp. Do not believe he needs to be admitted and does not require formal GI consult.  Ellouise Newer, PA-C

## 2020-08-19 DIAGNOSIS — K922 Gastrointestinal hemorrhage, unspecified: Secondary | ICD-10-CM

## 2020-08-19 DIAGNOSIS — D62 Acute posthemorrhagic anemia: Secondary | ICD-10-CM

## 2020-08-19 LAB — BASIC METABOLIC PANEL
Anion gap: 7 (ref 5–15)
BUN: 20 mg/dL (ref 8–23)
CO2: 25 mmol/L (ref 22–32)
Calcium: 8.4 mg/dL — ABNORMAL LOW (ref 8.9–10.3)
Chloride: 106 mmol/L (ref 98–111)
Creatinine, Ser: 0.75 mg/dL (ref 0.61–1.24)
GFR, Estimated: >60 mL/min (ref >60)
Glucose, Bld: 111 mg/dL — ABNORMAL HIGH (ref 70–99)
Potassium: 3.9 mmol/L (ref 3.5–5.1)
Sodium: 138 mmol/L (ref 135–145)

## 2020-08-19 LAB — CBC
HCT: 39.7 % (ref 39.0–52.0)
Hemoglobin: 13.1 g/dL (ref 13.0–17.0)
MCH: 32.1 pg (ref 26.0–34.0)
MCHC: 33 g/dL (ref 30.0–36.0)
MCV: 97.3 fL (ref 80.0–100.0)
Platelets: 288 10*3/uL (ref 150–400)
RBC: 4.08 MIL/uL — ABNORMAL LOW (ref 4.22–5.81)
RDW: 14.3 % (ref 11.5–15.5)
WBC: 10.1 10*3/uL (ref 4.0–10.5)
nRBC: 0 % (ref 0.0–0.2)

## 2020-08-19 LAB — HEMOGLOBIN
Hemoglobin: 13.7 g/dL (ref 13.0–17.0)
Hemoglobin: 14.3 g/dL (ref 13.0–17.0)

## 2020-08-19 MED ORDER — LACTATED RINGERS IV BOLUS
500.0000 mL | Freq: Once | INTRAVENOUS | Status: AC
  Administered 2020-08-19: 500 mL via INTRAVENOUS

## 2020-08-19 MED ORDER — SIMETHICONE 80 MG PO CHEW
80.0000 mg | CHEWABLE_TABLET | Freq: Four times a day (QID) | ORAL | Status: DC
  Administered 2020-08-19 – 2020-08-20 (×5): 80 mg via ORAL
  Filled 2020-08-19 (×5): qty 1

## 2020-08-19 NOTE — Progress Notes (Addendum)
[facility] ROUNDING NOTE   Subjective: Feeling better.  He he has not had any further episodes of rectal bleeding.  No significant decline in hemoglobin   Objective: Vital signs in last 24 hours: Temp:  [98 F (36.7 C)-98.7 F (37.1 C)] 98.6 F (37 C) (07/04 1011) Pulse Rate:  [86-116] 86 (07/04 1011) Resp:  [16-20] 16 (07/04 1011) BP: (88-150)/(57-102) 126/84 (07/04 1011) SpO2:  [93 %-97 %] 95 % (07/04 1011) Last BM Date: 08/19/20 General: NAD    Intake/Output from previous day: 07/03 0701 - 07/04 0700 In: 947.2 [I.V.:947.2] Out: 125 [Urine:125] Intake/Output this shift: Total I/O In: -  Out: 325 [Urine:325]   Lab Results: Recent Labs    08/18/20 1110 08/18/20 1411 08/18/20 1530 08/19/20 0443 08/19/20 0629 08/19/20 1220  WBC 8.8 8.0  --   --  10.1  --   HGB 15.7 11.6*   < > 13.7 13.1 14.3  PLT 325 232  --   --  288  --   MCV 99.2 100.0  --   --  97.3  --    < > = values in this interval not displayed.   BMET Recent Labs    08/18/20 1110 08/19/20 0629  NA 139 138  K 3.9 3.9  CL 105 106  CO2 29 25  GLUCOSE 147* 111*  BUN 12 20  CREATININE 0.64 0.75  CALCIUM 8.9 8.4*   LFT Recent Labs    08/18/20 1110  PROT 6.9  ALBUMIN 3.9  AST 20  ALT 15  ALKPHOS 77  BILITOT 0.7  BILIDIR 0.1  IBILI 0.6   PT/INR Recent Labs    08/18/20 1110  INR 0.9      Imaging/Other results: CT ABDOMEN PELVIS W CONTRAST  Result Date: 08/18/2020 CLINICAL DATA:  Bright red blood per rectum. Clinical concern for diverticulitis. EXAM: CT ABDOMEN AND PELVIS WITH CONTRAST TECHNIQUE: Multidetector CT imaging of the abdomen and pelvis was performed using the standard protocol following bolus administration of intravenous contrast. CONTRAST:  35mL OMNIPAQUE IOHEXOL 350 MG/ML SOLN COMPARISON:  10/12/2019 FINDINGS: Lower chest: Tiny right middle lobe perifissural nodule on image 3/series 4 is stable in the interval consistent with benign etiology. Hepatobiliary:  No suspicious focal abnormality within the liver parenchyma. There is no evidence for gallstones, gallbladder wall thickening, or pericholecystic fluid. No intrahepatic or extrahepatic biliary dilation. Pancreas: No focal mass lesion. No dilatation of the main duct. No intraparenchymal cyst. No peripancreatic edema. Spleen: Splenectomy. Small soft tissue nodule just posterior to the stomach on 10/02 is stable in the interval. Adrenals/Urinary Tract: No adrenal nodule or mass. Small cyst noted both kidneys with no substantial change. No evidence for hydroureter. The urinary bladder appears normal for the degree of distention. Stomach/Bowel: Small hiatal hernia. Stomach otherwise unremarkable. Duodenum is normally positioned as is the ligament of Treitz. No small bowel wall thickening. No small bowel dilatation. The terminal ileum is normal. The appendix is not well visualized, but there is no edema or inflammation in the region of the cecum. Patient has a redundant transverse colon with diverticular disease noted in the left colon. No evidence for diverticulitis. Colon is nondilated. Vascular/Lymphatic: There is abdominal aortic atherosclerosis without aneurysm. There is no gastrohepatic or hepatoduodenal ligament lymphadenopathy. No retroperitoneal or mesenteric lymphadenopathy. No pelvic sidewall lymphadenopathy. Reproductive: The prostate gland and seminal vesicles are unremarkable. Other: No intraperitoneal free fluid. Musculoskeletal: No worrisome lytic or sclerotic osseous abnormality. IMPRESSION: 1. No acute findings in the abdomen or pelvis. Specifically,  no findings to explain the patient's history of rectal bleeding. 2. Left colonic diverticulosis without diverticulitis. 3. Small hiatal hernia. 4. Aortic Atherosclerosis (ICD10-I70.0). Electronically Signed   By: Misty Stanley M.D.   On: 08/18/2020 14:03      Assessment &Plan  87 year old very pleasant gentleman with history of CVA admitted after  episodes of hematochezia Hemoglobin has stabilized, 14.3 on last check Advance diet as tolerated, ordered soft diet  Okay to restart low-dose aspirin tomorrow  Can consider discharge home later today or tomorrow if no further bleeding  Repeat CBC through PMD office in 1 week Follow-up in GI office as needed  GI will sign off, please call with any questions   This visit required 25 minutes of patient care (this includes precharting, chart review, review of results, face-to-face time used for counseling as well as treatment plan and follow-up. The patient was provided an opportunity to ask questions and all were answered. The patient agreed with the plan and demonstrated an understanding of the instructions.   Damaris Hippo , MD [phone]  [facility]

## 2020-08-19 NOTE — Progress Notes (Addendum)
[facility]                                                                              Patient Demographics  [name], is a 87 y.o. male, DOB - 1931/05/05, ZOX:[id number]  Admit date - 08/18/2020   Admitting Physician Nolen Lindamood Krystal Eaton, MD  Outpatient Primary MD for the patient is Seward Carol, MD  Outpatient specialists:   LOS - 1  days   Medical records reviewed and are as summarized below:    No chief complaint on file.      Brief summary   Patient is a 87 year old male with history of prior GI bleeds, history of CVA with mild left-sided weakness on aspirin, GERD, DM, hyperlipidemia presented to ED with rectal bleeding.  Patient had 2 episodes of bright red blood per rectum with BM at home and then subsequent 2 more episodes in the ED. No NSAID use, not on any anticoagulation. CT abdomen showed no acute findings except left colonic diverticulosis without diverticulitis. Initial hemoglobin 15.7 on admission, trended down to 11.6, patient was admitted for further work-up.  GI consulted.   Assessment & Plan    Principal Problem:   Acute blood loss anemia secondary to acute lower GI bleeding in the setting of recurrent GI bleeds with previous bleeds presumed to be diverticular, small bowel AVM -Presented with hemoglobin of 15.7, trended down to 11.6 after total of 4 episodes of rectal bleeding (2 at home and 2 in ED) -Patient was placed on clear liquid diet, serial H&H, -No further episodes of rectal bleeding since admission.  Hemoglobin stable at 13.1 -Awaiting further recommendations from GI Addendum: 1:45 PM Patient just had a bowel movement with dark red blood.  Will observe overnight to ensure no further active bleeding. If CBC remains stable, no further bleeding, likely DC home in a.m. Patient and wife agreeable with the plan, can resume low-dose aspirin upon discharge     Active Problems:   Diabetes mellitus type II, non insulin dependent  (HCC) -Follow hemoglobin A1c, hold metformin  -Continue sliding scale insulin while inpatient     HLD (hyperlipidemia) -Continue statin     History of CVA (cerebrovascular accident), TIA -Previously was on Plavix which was discontinued due to recurrent GI bleeding.  Now maintained on aspirin 325 mg daily -Currently aspirin on hold due to GI bleed       Benign essential HTN -BP currently stable, not on any antihypertensives outpatient -Was placed on hydralazine IV as needed with parameters for elevated BP readings in the ED   History of Chronic combined systolic and diastolic CHF (congestive heart failure) ([place]) -2D echo in 02/2016 had shown EF of 45%, G1 DD -Patient was placed on gentle hydration for 24 hours due to multiple episodes of GI bleeding.  Currently stable, euvolemic, follow closely for any volume overload       GERD (gastroesophageal reflux disease) - continue PPI   Code Status: Full CODE STATUS DVT Prophylaxis:  SCDs Start: 08/18/20 1653   Level of Care: Level of care: Telemetry Family Communication: Discussed all imaging results, lab results, explained to the patient  Disposition Plan:     Status is: Inpatient  Remains inpatient appropriate because:Inpatient level of care appropriate due to severity of illness  Dispo: The patient is from: Home              Anticipated d/c is to: Home              Patient currently is not medically stable to d/c.  Awaiting GI recommendation re: observation for another 24 hours versus DC home   Difficult to place patient No      Time Spent in minutes   25 minutes.  Procedures:  None   Consultants:   GI  Antimicrobials:   Anti-infectives (From admission, onward)    None          Medications  Scheduled Meds:  budesonide  2 mL Nebulization BID   melatonin  5 mg Oral QHS   pantoprazole  40 mg Oral Daily   predniSONE  5 mg Oral Daily   rOPINIRole  3 mg Oral QHS   simethicone  80 mg Oral QID    simvastatin  40 mg Oral QHS   Continuous Infusions:  lactated ringers 75 mL/hr at 08/19/20 0121   PRN Meds:.acetaminophen **OR** acetaminophen, albuterol, fluticasone, hydrALAZINE, polyvinyl alcohol      Subjective:   [name] was seen and examined today.  Per patient, no further bleeding since admission.  Patient denies dizziness, chest pain, shortness of breath . No acute events overnight.  Has gas pains and flatulence.  Objective:   Vitals:   08/19/20 0527 08/19/20 0839 08/19/20 0842 08/19/20 1011  BP: (!) 113/57   126/84  Pulse: 92   86  Resp: 16   16  Temp: 98.1 F (36.7 C)   98.6 F (37 C)  TempSrc: Oral   Oral  SpO2: 95% 97% 97% 95%  Weight:      Height:        Intake/Output Summary (Last 24 hours) at 08/19/2020 1115 Last data filed at 08/19/2020 0900 Gross per 24 hour  Intake 947.24 ml  Output 450 ml  Net 497.24 ml     Wt Readings from Last 3 Encounters:  08/18/20 70.3 kg  04/10/20 69.9 kg  03/19/20 70.5 kg   Physical Exam General: Alert and oriented x 3, NAD Cardiovascular: S1 S2 clear, RRR. No pedal edema b/l Respiratory: CTAB, no wheezing, rales or rhonchi Gastrointestinal: Soft, nontender, nondistended, NBS Ext: no pedal edema bilaterally Neuro: no new deficits Psych: Normal affect and demeanor, alert and oriented x3    Data Reviewed:  I have personally reviewed following labs and imaging studies  Micro Results Recent Results (from the past 240 hour(s))  Resp Panel by RT-PCR (Flu A&B, Covid) Nasopharyngeal Swab     Status: None   Collection Time: 08/18/20  3:48 PM   Specimen: Nasopharyngeal Swab; Nasopharyngeal(NP) swabs in vial transport medium  Result Value Ref Range Status   SARS Coronavirus 2 by RT PCR NEGATIVE NEGATIVE Final    Comment: (NOTE) SARS-CoV-2 target nucleic acids are NOT DETECTED.  The SARS-CoV-2 RNA is generally detectable in upper respiratory specimens during the acute phase of infection. The lowest concentration of  SARS-CoV-2 viral copies this assay can detect is 138 copies/mL. A negative result does not preclude SARS-Cov-2 infection and should not be used as the sole basis for treatment or other patient management decisions. A negative result may occur with  improper specimen collection/handling, submission of specimen other than nasopharyngeal swab, presence of viral mutation(s)  within the areas targeted by this assay, and inadequate number of viral copies(<138 copies/mL). A negative result must be combined with clinical observations, patient history, and epidemiological information. The expected result is Negative.  Fact Sheet for Patients:  EntrepreneurPulse.com.au  Fact Sheet for Healthcare Providers:  IncredibleEmployment.be  This test is no t yet approved or cleared by the [place] FDA and  has been authorized for detection and/or diagnosis of SARS-CoV-2 by FDA under an Emergency Use Authorization (EUA). This EUA will remain  in effect (meaning this test can be used) for the duration of the COVID-19 declaration under Section 564(b)(1) of the Act, 21 U.S.C.section 360bbb-3(b)(1), unless the authorization is terminated  or revoked sooner.       Influenza A by PCR NEGATIVE NEGATIVE Final   Influenza B by PCR NEGATIVE NEGATIVE Final    Comment: (NOTE) The Xpert Xpress SARS-CoV-2/FLU/RSV plus assay is intended as an aid in the diagnosis of influenza from Nasopharyngeal swab specimens and should not be used as a sole basis for treatment. Nasal washings and aspirates are unacceptable for Xpert Xpress SARS-CoV-2/FLU/RSV testing.  Fact Sheet for Patients: EntrepreneurPulse.com.au  Fact Sheet for Healthcare Providers: IncredibleEmployment.be  This test is not yet approved or cleared by the [place] FDA and has been authorized for detection and/or diagnosis of SARS-CoV-2 by FDA under an Emergency Use  Authorization (EUA). This EUA will remain in effect (meaning this test can be used) for the duration of the COVID-19 declaration under Section 564(b)(1) of the Act, 21 U.S.C. section 360bbb-3(b)(1), unless the authorization is terminated or revoked.  Performed at [facility], [place] [address], [place], [place] [address]     Radiology Reports CT ABDOMEN PELVIS W CONTRAST  Result Date: 08/18/2020 CLINICAL DATA:  Bright red blood per rectum. Clinical concern for diverticulitis. EXAM: CT ABDOMEN AND PELVIS WITH CONTRAST TECHNIQUE: Multidetector CT imaging of the abdomen and pelvis was performed using the standard protocol following bolus administration of intravenous contrast. CONTRAST:  51mL OMNIPAQUE IOHEXOL 350 MG/ML SOLN COMPARISON:  10/12/2019 FINDINGS: Lower chest: Tiny right middle lobe perifissural nodule on image 3/series 4 is stable in the interval consistent with benign etiology. Hepatobiliary: No suspicious focal abnormality within the liver parenchyma. There is no evidence for gallstones, gallbladder wall thickening, or pericholecystic fluid. No intrahepatic or extrahepatic biliary dilation. Pancreas: No focal mass lesion. No dilatation of the main duct. No intraparenchymal cyst. No peripancreatic edema. Spleen: Splenectomy. Small soft tissue nodule just posterior to the stomach on 10/02 is stable in the interval. Adrenals/Urinary Tract: No adrenal nodule or mass. Small cyst noted both kidneys with no substantial change. No evidence for hydroureter. The urinary bladder appears normal for the degree of distention. Stomach/Bowel: Small hiatal hernia. Stomach otherwise unremarkable. Duodenum is normally positioned as is the ligament of Treitz. No small bowel wall thickening. No small bowel dilatation. The terminal ileum is normal. The appendix is not well visualized, but there is no edema or inflammation in the region of the cecum. Patient has a redundant transverse colon with  diverticular disease noted in the left colon. No evidence for diverticulitis. Colon is nondilated. Vascular/Lymphatic: There is abdominal aortic atherosclerosis without aneurysm. There is no gastrohepatic or hepatoduodenal ligament lymphadenopathy. No retroperitoneal or mesenteric lymphadenopathy. No pelvic sidewall lymphadenopathy. Reproductive: The prostate gland and seminal vesicles are unremarkable. Other: No intraperitoneal free fluid. Musculoskeletal: No worrisome lytic or sclerotic osseous abnormality. IMPRESSION: 1. No acute findings in the abdomen or pelvis. Specifically, no findings to explain the  patient's history of rectal bleeding. 2. Left colonic diverticulosis without diverticulitis. 3. Small hiatal hernia. 4. Aortic Atherosclerosis (ICD10-I70.0). Electronically Signed   By: Misty Stanley M.D.   On: 08/18/2020 14:03    Lab Data:  CBC: Recent Labs  Lab 08/18/20 1110 08/18/20 1411 08/18/20 1530 08/18/20 2048 08/19/20 0443 08/19/20 0629  WBC 8.8 8.0  --   --   --  10.1  NEUTROABS 6.4  --   --   --   --   --   HGB 15.7 11.6* 12.5* 15.7 13.7 13.1  HCT 48.8 36.2* 39.8  --   --  39.7  MCV 99.2 100.0  --   --   --  97.3  PLT 325 232  --   --   --  408   Basic Metabolic Panel: Recent Labs  Lab 08/18/20 1110 08/19/20 0629  NA 139 138  K 3.9 3.9  CL 105 106  CO2 29 25  GLUCOSE 147* 111*  BUN 12 20  CREATININE 0.64 0.75  CALCIUM 8.9 8.4*   GFR: Estimated Creatinine Clearance: 65.9 mL/min (by C-G formula based on SCr of 0.75 mg/dL). Liver Function Tests: Recent Labs  Lab 08/18/20 1110  AST 20  ALT 15  ALKPHOS 77  BILITOT 0.7  PROT 6.9  ALBUMIN 3.9   Recent Labs  Lab 08/18/20 1110  LIPASE 21   No results for input(s): AMMONIA in the last 168 hours. Coagulation Profile: Recent Labs  Lab 08/18/20 1110  INR 0.9   Cardiac Enzymes: No results for input(s): CKTOTAL, CKMB, CKMBINDEX, TROPONINI in the last 168 hours. BNP (last 3 results) No results for input(s):  PROBNP in the last 8760 hours. HbA1C: No results for input(s): HGBA1C in the last 72 hours. CBG: No results for input(s): GLUCAP in the last 168 hours. Lipid Profile: No results for input(s): CHOL, HDL, LDLCALC, TRIG, CHOLHDL, LDLDIRECT in the last 72 hours. Thyroid Function Tests: No results for input(s): TSH, T4TOTAL, FREET4, T3FREE, THYROIDAB in the last 72 hours. Anemia Panel: No results for input(s): VITAMINB12, FOLATE, FERRITIN, TIBC, IRON, RETICCTPCT in the last 72 hours. Urine analysis:    Component Value Date/Time   COLORURINE STRAW (A) 04/21/2018 2320   APPEARANCEUR CLEAR 04/21/2018 2320   LABSPEC 1.013 04/21/2018 2320   PHURINE 6.0 04/21/2018 2320   GLUCOSEU NEGATIVE 04/21/2018 2320   HGBUR NEGATIVE 04/21/2018 2320   BILIRUBINUR NEGATIVE 04/21/2018 2320   KETONESUR NEGATIVE 04/21/2018 2320   PROTEINUR NEGATIVE 04/21/2018 2320   UROBILINOGEN 0.2 12/22/2013 1210   NITRITE NEGATIVE 04/21/2018 2320   LEUKOCYTESUR NEGATIVE 04/21/2018 2320     Jaidy Cottam M.D. [facility] 08/19/2020, 11:15 AM  Available via Epic secure chat 7am-7pm After 7 pm, please refer to night coverage provider listed on amion.

## 2020-08-19 NOTE — Plan of Care (Signed)
Pt continues to have bloody bowel movements, maintained on falls precautions, no distress noted on shift. [place]  Problem: Education: Goal: Knowledge of General Education information will improve Description: Including pain rating scale, medication(s)/side effects and non-pharmacologic comfort measures Outcome: Progressing   Problem: Clinical Measurements: Goal: Will remain free from infection Outcome: Progressing

## 2020-08-20 LAB — CBC
HCT: 41.5 % (ref 39.0–52.0)
Hemoglobin: 13.5 g/dL (ref 13.0–17.0)
MCH: 32.2 pg (ref 26.0–34.0)
MCHC: 32.5 g/dL (ref 30.0–36.0)
MCV: 99 fL (ref 80.0–100.0)
Platelets: 299 10*3/uL (ref 150–400)
RBC: 4.19 MIL/uL — ABNORMAL LOW (ref 4.22–5.81)
RDW: 14.6 % (ref 11.5–15.5)
WBC: 9.1 10*3/uL (ref 4.0–10.5)
nRBC: 0 % (ref 0.0–0.2)

## 2020-08-20 LAB — HEMOGLOBIN A1C
Hgb A1c MFr Bld: 6.3 % — ABNORMAL HIGH (ref 4.8–5.6)
Mean Plasma Glucose: 134 mg/dL

## 2020-08-20 MED ORDER — ASPIRIN EC 81 MG PO TBEC: 81.0000 mg | DELAYED_RELEASE_TABLET | Freq: Every day | ORAL | 11 refills | Status: DC

## 2020-08-20 MED ORDER — SIMETHICONE 80 MG PO CHEW: 80.0000 mg | CHEWABLE_TABLET | Freq: Four times a day (QID) | ORAL | 0 refills | Status: DC | PRN

## 2020-08-20 NOTE — Discharge Summary (Addendum)
Physician Discharge Summary   Patient ID: [name] MRN: [id number] DOB/AGE: May 13, 1931 87 y.o.  Admit date: 08/18/2020 Discharge date: 08/20/2020  Primary Care Physician:  Seward Carol, MD   Recommendations for Outpatient Follow-up:  Follow up with PCP in 1-2 weeks Resume aspirin 81 mg daily, outpatient follow-up with GI (patient was on aspirin 325 mg daily prior to admission)  Home Health: None, recommended supervision 24/7 Equipment/Devices:   Discharge Condition: stable  CODE STATUS: FULL  Diet recommendation: Heart healthy diet   Discharge Diagnoses:     Acute lower GI bleeding, likely diverticular  Acute blood loss anemia  AVM (arteriovenous malformation) of small bowel, acquired with hemorrhage  Benign essential HTN  History of chronic combined systolic and diastolic CHF (congestive heart failure) (HCC)  Diverticulosis  GERD (gastroesophageal reflux disease)  HLD (hyperlipidemia)  TIA (transient ischemic attack)   Consults: Gastroenterology    Allergies:   Allergies  Allergen Reactions   Tape Other (See Comments)    SKIN IS SENSITIVE; PLEASE USE PAPER TAPE; SKIN BRUISES AND TEARS EASILY!!     DISCHARGE MEDICATIONS: Allergies as of 08/20/2020       Reactions   Tape Other (See Comments)   SKIN IS SENSITIVE; PLEASE USE PAPER TAPE; SKIN BRUISES AND TEARS EASILY!!        Medication List     TAKE these medications    albuterol 108 (90 Base) MCG/ACT inhaler Commonly known as: VENTOLIN HFA Inhale 2 puffs into the lungs every 6 (six) hours as needed for wheezing or shortness of breath.   albuterol (2.5 MG/3ML) 0.083% nebulizer solution Commonly known as: PROVENTIL Take 3 mLs by nebulization every 6 (six) hours as needed for wheezing or shortness of breath.   aspirin EC 81 MG tablet Take 1 tablet (81 mg total) by mouth daily. Swallow whole. What changed:  medication strength how much to take additional instructions   budesonide 0.5 MG/2ML  nebulizer solution Commonly known as: PULMICORT Take 2 mLs by nebulization 2 (two) times daily.   diphenhydramine-acetaminophen 25-500 MG Tabs tablet Commonly known as: TYLENOL PM Take 1 tablet by mouth at bedtime.   ferrous sulfate 325 (65 FE) MG tablet Take 325 mg by mouth daily with breakfast.   fluticasone 50 MCG/ACT nasal spray Commonly known as: FLONASE Place 1 spray into both nostrils daily as needed for allergies or rhinitis.   LUBRICATING EYE DROPS OP Apply 1 drop to eye as needed (dry eyes, eye discomfort).   Melatonin 5 MG Caps Take 5 mg by mouth at bedtime.   metFORMIN 500 MG tablet Commonly known as: GLUCOPHAGE Take 500 mg by mouth daily.   pantoprazole 40 MG tablet Commonly known as: PROTONIX Take 1 tablet (40 mg total) by mouth daily.   polyethylene glycol 17 g packet Commonly known as: MIRALAX / GLYCOLAX Take 17 g by mouth daily as needed for mild constipation (MIX AND DRINK).   predniSONE 10 MG tablet Commonly known as: DELTASONE Take 5 mg by mouth daily.   psyllium 58.6 % powder Commonly known as: METAMUCIL Take 1 packet by mouth daily as needed (for constipation- MIX AND DRINK).   rOPINIRole 3 MG tablet Commonly known as: REQUIP Take 1 tablet (3 mg total) by mouth at bedtime.   simethicone 80 MG chewable tablet Commonly known as: MYLICON Chew 1 tablet (80 mg total) by mouth 4 (four) times daily as needed for flatulence. Also available over-the-counter   simvastatin 40 MG tablet Commonly known as: ZOCOR Take 1  tablet (40 mg total) by mouth daily. What changed: when to take this   traMADol 50 MG tablet Commonly known as: ULTRAM Take 1 tablet (50 mg total) by mouth daily as needed for moderate pain.   VITAMIN B-12 PO Take 1 tablet by mouth daily.   vitamin C 500 MG tablet Commonly known as: ASCORBIC ACID Take 500 mg by mouth daily.   VITAMIN D3 PO Take 1 tablet by mouth daily.   ZINC PO Take 1 tablet by mouth daily.          Brief H and P: For complete details please refer to admission H and P, but in brief Patient is a 87 year old male with history of prior GI bleeds, history of CVA with mild left-sided weakness on aspirin, GERD, DM, hyperlipidemia presented to ED with rectal bleeding.  Patient had 2 episodes of bright red blood per rectum with BM at home and then subsequent 2 more episodes in the ED. No NSAID use, not on any anticoagulation. CT abdomen showed no acute findings except left colonic diverticulosis without diverticulitis. Initial hemoglobin 15.7 on admission, trended down to 11.6, patient was admitted for further work-up.  GI was consulted.   Hospital course   Acute blood loss anemia secondary to acute lower GI bleeding in the setting of recurrent GI bleeds with previous bleeds presumed to be diverticular, small bowel AVM -Presented with hemoglobin of 15.7, trended down to 11.6 after total of 4 episodes of rectal bleeding (2 at home and 2 in ED) -Patient was placed on clear liquid diet and serial H&H were monitored. -No further episodes of rectal bleeding since admission.  No further acute GI bleeding during this hospitalization.  Hemoglobin remained stable at 13.5 -Patient was cleared by GI to be discharged home, recommended to resume low-dose aspirin upon discharge..     Diabetes mellitus type II, non insulin dependent (HCC) -Hemoglobin A1c 6.3.  Metformin was held during hospitalization, resume upon discharge.. -Patient was continued on sliding scale insulin     HLD (hyperlipidemia) -Continue statin     History of CVA (cerebrovascular accident), TIA -Previously was on Plavix which was discontinued due to recurrent GI bleeding.  Now maintained on aspirin 325 mg daily PTA, on hold during hospitalization due to GI bleeding -Per GI, can resume aspirin 81 mg daily       Benign essential HTN -BP currently stable, not on any antihypertensives outpatient   History of Chronic combined  systolic and diastolic CHF (congestive heart failure) ([facility]) -2D echo in 02/2016 had shown EF of 45%, G1 DD -Patient was placed on gentle hydration for 24 hours due to multiple episodes of GI bleeding.  Currently stable, euvolemic.       GERD (gastroesophageal reflux disease) - continue PPI    Day of Discharge S: Doing well, no further bleeding.  Tolerating diet.  H&H stable, looking forward to go home  BP 127/79 (BP Location: Right Arm)   Pulse 81   Temp 98.1 F (36.7 C) (Oral)   Resp 18   Ht 5\' 10"  (1.778 m)   Wt 70.3 kg   SpO2 92%   BMI 22.24 kg/m   Physical Exam: General: Alert and awake oriented x3 not in any acute distress. CVS: S1-S2 clear no murmur rubs or gallops Chest: clear to auscultation bilaterally, no wheezing rales or rhonchi Abdomen: soft nontender, nondistended, normal bowel sounds Extremities: no cyanosis, clubbing or edema noted bilaterally Neuro: Cranial nerves II-XII intact, no focal neurological deficits  Get Medicines reviewed and adjusted: Please take all your medications with you for your next visit with your Primary MD  Please request your Primary MD to go over all hospital tests and procedure/radiological results at the follow up. Please ask your Primary MD to get all Hospital records sent to his/her office.  If you experience worsening of your admission symptoms, develop shortness of breath, life threatening emergency, suicidal or homicidal thoughts you must seek medical attention immediately by calling 911 or calling your MD immediately  if symptoms less severe.  You must read complete instructions/literature along with all the possible adverse reactions/side effects for all the Medicines you take and that have been prescribed to you. Take any new Medicines after you have completely understood and accept all the possible adverse reactions/side effects.   Do not drive when taking pain medications.   Do not take more than prescribed Pain, Sleep  and Anxiety Medications  Special Instructions: If you have smoked or chewed Tobacco  in the last 2 yrs please stop smoking, stop any regular Alcohol  and or any Recreational drug use.  Wear Seat belts while driving.  Please note  You were cared for by a hospitalist during your hospital stay. Once you are discharged, your primary care physician will handle any further medical issues. Please note that NO REFILLS for any discharge medications will be authorized once you are discharged, as it is imperative that you return to your primary care physician (or establish a relationship with a primary care physician if you do not have one) for your aftercare needs so that they can reassess your need for medications and monitor your lab values.   The results of significant diagnostics from this hospitalization (including imaging, microbiology, ancillary and laboratory) are listed below for reference.      Procedures/Studies:  CT ABDOMEN PELVIS W CONTRAST  Result Date: 08/18/2020 CLINICAL DATA:  Bright red blood per rectum. Clinical concern for diverticulitis. EXAM: CT ABDOMEN AND PELVIS WITH CONTRAST TECHNIQUE: Multidetector CT imaging of the abdomen and pelvis was performed using the standard protocol following bolus administration of intravenous contrast. CONTRAST:  105mL OMNIPAQUE IOHEXOL 350 MG/ML SOLN COMPARISON:  10/12/2019 FINDINGS: Lower chest: Tiny right middle lobe perifissural nodule on image 3/series 4 is stable in the interval consistent with benign etiology. Hepatobiliary: No suspicious focal abnormality within the liver parenchyma. There is no evidence for gallstones, gallbladder wall thickening, or pericholecystic fluid. No intrahepatic or extrahepatic biliary dilation. Pancreas: No focal mass lesion. No dilatation of the main duct. No intraparenchymal cyst. No peripancreatic edema. Spleen: Splenectomy. Small soft tissue nodule just posterior to the stomach on 10/02 is stable in the interval.  Adrenals/Urinary Tract: No adrenal nodule or mass. Small cyst noted both kidneys with no substantial change. No evidence for hydroureter. The urinary bladder appears normal for the degree of distention. Stomach/Bowel: Small hiatal hernia. Stomach otherwise unremarkable. Duodenum is normally positioned as is the ligament of Treitz. No small bowel wall thickening. No small bowel dilatation. The terminal ileum is normal. The appendix is not well visualized, but there is no edema or inflammation in the region of the cecum. Patient has a redundant transverse colon with diverticular disease noted in the left colon. No evidence for diverticulitis. Colon is nondilated. Vascular/Lymphatic: There is abdominal aortic atherosclerosis without aneurysm. There is no gastrohepatic or hepatoduodenal ligament lymphadenopathy. No retroperitoneal or mesenteric lymphadenopathy. No pelvic sidewall lymphadenopathy. Reproductive: The prostate gland and seminal vesicles are unremarkable. Other: No intraperitoneal free fluid. Musculoskeletal: No  worrisome lytic or sclerotic osseous abnormality. IMPRESSION: 1. No acute findings in the abdomen or pelvis. Specifically, no findings to explain the patient's history of rectal bleeding. 2. Left colonic diverticulosis without diverticulitis. 3. Small hiatal hernia. 4. Aortic Atherosclerosis (ICD10-I70.0). Electronically Signed   By: Misty Stanley M.D.   On: 08/18/2020 14:03      LAB RESULTS: Basic Metabolic Panel: Recent Labs  Lab 08/18/20 1110 08/19/20 0629  NA 139 138  K 3.9 3.9  CL 105 106  CO2 29 25  GLUCOSE 147* 111*  BUN 12 20  CREATININE 0.64 0.75  CALCIUM 8.9 8.4*   Liver Function Tests: Recent Labs  Lab 08/18/20 1110  AST 20  ALT 15  ALKPHOS 77  BILITOT 0.7  PROT 6.9  ALBUMIN 3.9   Recent Labs  Lab 08/18/20 1110  LIPASE 21   No results for input(s): AMMONIA in the last 168 hours. CBC: Recent Labs  Lab 08/18/20 1110 08/18/20 1411 08/19/20 0629  08/19/20 1220 08/20/20 0453  WBC 8.8   < > 10.1  --  9.1  NEUTROABS 6.4  --   --   --   --   HGB 15.7   < > 13.1 14.3 13.5  HCT 48.8   < > 39.7  --  41.5  MCV 99.2   < > 97.3  --  99.0  PLT 325   < > 288  --  299   < > = values in this interval not displayed.   Cardiac Enzymes: No results for input(s): CKTOTAL, CKMB, CKMBINDEX, TROPONINI in the last 168 hours. BNP: Invalid input(s): POCBNP CBG: No results for input(s): GLUCAP in the last 168 hours.     Disposition and Follow-up: Discharge Instructions     Diet - low sodium heart healthy   Complete by: As directed    Increase activity slowly   Complete by: As directed         DISPOSITION: [place]     Seward Carol, MD Follow up in 2 week(s).   Specialty: Internal Medicine Why: for hospital follow-up Contact information: [address] [place] [address] [phone]         Milus Banister, MD. Schedule an appointment as soon as possible for a visit in 2 week(s).   Specialty: Gastroenterology Contact information: [address] [place] [address] [phone]                  Time coordinating discharge:  21mins  Signed:   Estill Cotta M.D. [facility] 08/20/2020, 12:14 PM

## 2020-08-20 NOTE — Evaluation (Signed)
Physical Therapy Evaluation Patient Details Name: [name] MRN: [id number] DOB: [birth year] Today's Date: 08/20/2020   History of Present Illness  87 yo male admitted with anemia, LGIB. Hx of CVA, DM, recurrent GI bleeds  Clinical Impression  On eval, pt was mostly Min guard A (Min A a time or two) for mobility. He walked ~100 feet with a rollator. He normally wears an AFO for L foot but it was not in the room at the time of eval. Pt denied dizziness. Discussed d/c plan-he will return to his Ind Living facility where he lives with his wife. Wife reports he has a caregiver that comes in 2x/week to assist with exercises, etc. Pt politely declines PT f/u at this time. Instructed pt and wife to have PCP order HH or OP PT if a need arises. Pt plans to d/c home on today if cleared by MD.     Follow Up Recommendations Supervision/Assistance - 24 hour    Equipment Recommendations  None recommended by PT    Recommendations for Other Services       Precautions / Restrictions Precautions Precautions: Fall Precaution Comments: residual L side weaknses-L drop foot; wears AFO, knee brace Restrictions Weight Bearing Restrictions: No      Mobility  Bed Mobility Overal bed mobility: Modified Independent             General bed mobility comments: increased time. no assist provided    Transfers Overall transfer level: Needs assistance Equipment used: 4-wheeled walker Transfers: Sit to/from Stand Sit to Stand: Min guard         General transfer comment: cues for safety.  Ambulation/Gait Ambulation/Gait assistance: Min guard;Min assist Gait Distance (Feet): 100 Feet Assistive device: 4-wheeled walker Gait Pattern/deviations: Step-through pattern;Decreased dorsiflexion - left     General Gait Details: Mostly Min guard assist. Intermittent assist to steady 2* drop foot (pt does not have AFO here in hospital). Tolerated distance well. No dizziness.  Stairs             Wheelchair Mobility    Modified Rankin (Stroke Patients Only)       Balance Overall balance assessment: Needs assistance         Standing balance support: Bilateral upper extremity supported Standing balance-Leahy Scale: Fair                               Pertinent Vitals/Pain Pain Assessment: No/denies pain    Home Living Family/patient expects to be discharged to:: Private residence Living Arrangements: Spouse/significant other Available Help at Discharge: Family;Available 24 hours/day Type of Home: Independent living facility Home Access: Level entry     Home Layout: One level Home Equipment: Grab bars - tub/shower;Grab bars - toilet;Walker - 2 wheels;Walker - 4 wheels;Wheelchair - manual;Hand held shower head;Shower seat - built in;Transport chair      Prior Function Level of Independence: Independent with assistive device(s)         Comments: Uses rollator - can walk community distances if he has to but typically does not; independent with ADLs- IADLs partially managed by facility and wife. uses golf cart to get around [place]. Using electric wheelchar quite a bit more now     Hand Dominance        Extremity/Trunk Assessment   Upper Extremity Assessment Upper Extremity Assessment: Generalized weakness    Lower Extremity Assessment Lower Extremity Assessment: Generalized weakness    Cervical / Trunk Assessment  Cervical / Trunk Assessment: Kyphotic  Communication   Communication: No difficulties  Cognition Arousal/Alertness: Awake/alert Behavior During Therapy: WFL for tasks assessed/performed Overall Cognitive Status: Within Functional Limits for tasks assessed                                        General Comments      Exercises     Assessment/Plan    PT Assessment All further PT needs can be met in the next venue of care (HHPT if pt feels he needs it)  PT Problem List Decreased strength;Decreased  mobility;Decreased balance       PT Treatment Interventions      PT Goals (Current goals can be found in the Care Plan section)  Acute Rehab PT Goals Patient Stated Goal: home soon PT Goal Formulation: All assessment and education complete, DC therapy    Frequency     Barriers to discharge        Co-evaluation               AM-PAC PT "6 Clicks" Mobility  Outcome Measure Help needed turning from your back to your side while in a flat bed without using bedrails?: None Help needed moving from lying on your back to sitting on the side of a flat bed without using bedrails?: None Help needed moving to and from a bed to a chair (including a wheelchair)?: A Little Help needed standing up from a chair using your arms (e.g., wheelchair or bedside chair)?: A Little Help needed to walk in hospital room?: A Little Help needed climbing 3-5 steps with a railing? : A Lot 6 Click Score: 19    End of Session Equipment Utilized During Treatment: Gait belt Activity Tolerance: Patient tolerated treatment well Patient left: in chair;with call bell/phone within reach;with family/visitor present   PT Visit Diagnosis: Unsteadiness on feet (R26.81);Muscle weakness (generalized) (M62.81)    Time: 1483-0735 PT Time Calculation (min) (ACUTE ONLY): 19 min   Charges:   PT Evaluation $PT Eval Moderate Complexity: 1 Mod             Doreatha Massed, PT Acute Rehabilitation  Office: [phone] Pager: [phone]

## 2020-08-20 NOTE — Plan of Care (Signed)
Very happy gentleman, ready to go home; slept comfortably all night; no s/s of acute distress or pain reported or observed; call light within reach and bed at lowest position for safety.

## 2020-08-23 ENCOUNTER — Telehealth: Payer: Self-pay | Admitting: Gastroenterology

## 2020-08-23 NOTE — Telephone Encounter (Signed)
Patient calling to inform he is discharged from [facility]. Pt wants to know if its ok to continue with his medication regimen. Plz advise  Thank you

## 2020-08-26 NOTE — Telephone Encounter (Signed)
Left message for the pt to continue the recommendations per hospital discharge and call to make a follow up with Dr Ardis Hughs.

## 2020-09-02 DIAGNOSIS — I639 Cerebral infarction, unspecified: Secondary | ICD-10-CM | POA: Diagnosis not present

## 2020-09-02 DIAGNOSIS — E7800 Pure hypercholesterolemia, unspecified: Secondary | ICD-10-CM | POA: Diagnosis not present

## 2020-09-02 DIAGNOSIS — Z7984 Long term (current) use of oral hypoglycemic drugs: Secondary | ICD-10-CM | POA: Diagnosis not present

## 2020-09-02 DIAGNOSIS — I519 Heart disease, unspecified: Secondary | ICD-10-CM | POA: Diagnosis not present

## 2020-09-02 DIAGNOSIS — K922 Gastrointestinal hemorrhage, unspecified: Secondary | ICD-10-CM | POA: Diagnosis not present

## 2020-09-13 DIAGNOSIS — E119 Type 2 diabetes mellitus without complications: Secondary | ICD-10-CM | POA: Diagnosis not present

## 2020-09-13 DIAGNOSIS — G459 Transient cerebral ischemic attack, unspecified: Secondary | ICD-10-CM | POA: Diagnosis not present

## 2020-09-13 DIAGNOSIS — J4541 Moderate persistent asthma with (acute) exacerbation: Secondary | ICD-10-CM | POA: Diagnosis not present

## 2020-09-13 DIAGNOSIS — G4700 Insomnia, unspecified: Secondary | ICD-10-CM | POA: Diagnosis not present

## 2020-09-13 DIAGNOSIS — E1169 Type 2 diabetes mellitus with other specified complication: Secondary | ICD-10-CM | POA: Diagnosis not present

## 2020-09-13 DIAGNOSIS — I10 Essential (primary) hypertension: Secondary | ICD-10-CM | POA: Diagnosis not present

## 2020-09-13 DIAGNOSIS — J4520 Mild intermittent asthma, uncomplicated: Secondary | ICD-10-CM | POA: Diagnosis not present

## 2020-09-13 DIAGNOSIS — J4540 Moderate persistent asthma, uncomplicated: Secondary | ICD-10-CM | POA: Diagnosis not present

## 2020-09-13 DIAGNOSIS — E7800 Pure hypercholesterolemia, unspecified: Secondary | ICD-10-CM | POA: Diagnosis not present

## 2020-09-13 DIAGNOSIS — J45901 Unspecified asthma with (acute) exacerbation: Secondary | ICD-10-CM | POA: Diagnosis not present

## 2020-09-13 DIAGNOSIS — J45909 Unspecified asthma, uncomplicated: Secondary | ICD-10-CM | POA: Diagnosis not present

## 2020-09-14 ENCOUNTER — Other Ambulatory Visit: Payer: Self-pay

## 2020-09-14 ENCOUNTER — Encounter (HOSPITAL_COMMUNITY): Payer: Self-pay | Admitting: Emergency Medicine

## 2020-09-14 ENCOUNTER — Inpatient Hospital Stay (HOSPITAL_COMMUNITY)
Admission: EM | Admit: 2020-09-14 | Discharge: 2020-09-17 | DRG: 378 | Disposition: A | Discharge status: Home / Self Care | Payer: Medicare Other | Attending: Internal Medicine | Admitting: Internal Medicine

## 2020-09-14 DIAGNOSIS — R1310 Dysphagia, unspecified: Secondary | ICD-10-CM

## 2020-09-14 DIAGNOSIS — I10 Essential (primary) hypertension: Secondary | ICD-10-CM | POA: Diagnosis not present

## 2020-09-14 DIAGNOSIS — Z20822 Contact with and (suspected) exposure to covid-19: Secondary | ICD-10-CM | POA: Diagnosis present

## 2020-09-14 DIAGNOSIS — Z79899 Other long term (current) drug therapy: Secondary | ICD-10-CM | POA: Diagnosis not present

## 2020-09-14 DIAGNOSIS — K922 Gastrointestinal hemorrhage, unspecified: Principal | ICD-10-CM | POA: Diagnosis present

## 2020-09-14 DIAGNOSIS — I480 Paroxysmal atrial fibrillation: Secondary | ICD-10-CM | POA: Diagnosis not present

## 2020-09-14 DIAGNOSIS — Z7982 Long term (current) use of aspirin: Secondary | ICD-10-CM | POA: Diagnosis not present

## 2020-09-14 DIAGNOSIS — Z743 Need for continuous supervision: Secondary | ICD-10-CM | POA: Diagnosis not present

## 2020-09-14 DIAGNOSIS — K31819 Angiodysplasia of stomach and duodenum without bleeding: Secondary | ICD-10-CM | POA: Diagnosis not present

## 2020-09-14 DIAGNOSIS — Z9081 Acquired absence of spleen: Secondary | ICD-10-CM

## 2020-09-14 DIAGNOSIS — I5042 Chronic combined systolic (congestive) and diastolic (congestive) heart failure: Secondary | ICD-10-CM | POA: Diagnosis not present

## 2020-09-14 DIAGNOSIS — Z7951 Long term (current) use of inhaled steroids: Secondary | ICD-10-CM

## 2020-09-14 DIAGNOSIS — Z7952 Long term (current) use of systemic steroids: Secondary | ICD-10-CM

## 2020-09-14 DIAGNOSIS — I499 Cardiac arrhythmia, unspecified: Secondary | ICD-10-CM | POA: Diagnosis not present

## 2020-09-14 DIAGNOSIS — K219 Gastro-esophageal reflux disease without esophagitis: Secondary | ICD-10-CM | POA: Diagnosis not present

## 2020-09-14 DIAGNOSIS — E119 Type 2 diabetes mellitus without complications: Secondary | ICD-10-CM | POA: Diagnosis not present

## 2020-09-14 DIAGNOSIS — I959 Hypotension, unspecified: Secondary | ICD-10-CM | POA: Diagnosis present

## 2020-09-14 DIAGNOSIS — Z86010 Personal history of colonic polyps: Secondary | ICD-10-CM | POA: Diagnosis not present

## 2020-09-14 DIAGNOSIS — K5790 Diverticulosis of intestine, part unspecified, without perforation or abscess without bleeding: Secondary | ICD-10-CM | POA: Diagnosis present

## 2020-09-14 DIAGNOSIS — Z91048 Other nonmedicinal substance allergy status: Secondary | ICD-10-CM

## 2020-09-14 DIAGNOSIS — I110 Hypertensive heart disease with heart failure: Secondary | ICD-10-CM | POA: Diagnosis present

## 2020-09-14 DIAGNOSIS — K921 Melena: Secondary | ICD-10-CM | POA: Diagnosis not present

## 2020-09-14 DIAGNOSIS — J45991 Cough variant asthma: Secondary | ICD-10-CM | POA: Diagnosis present

## 2020-09-14 DIAGNOSIS — R6889 Other general symptoms and signs: Secondary | ICD-10-CM | POA: Diagnosis not present

## 2020-09-14 DIAGNOSIS — Z8673 Personal history of transient ischemic attack (TIA), and cerebral infarction without residual deficits: Secondary | ICD-10-CM | POA: Diagnosis not present

## 2020-09-14 DIAGNOSIS — Q399 Congenital malformation of esophagus, unspecified: Secondary | ICD-10-CM | POA: Diagnosis not present

## 2020-09-14 DIAGNOSIS — R0902 Hypoxemia: Secondary | ICD-10-CM | POA: Diagnosis not present

## 2020-09-14 DIAGNOSIS — K5521 Angiodysplasia of colon with hemorrhage: Secondary | ICD-10-CM | POA: Diagnosis present

## 2020-09-14 DIAGNOSIS — K449 Diaphragmatic hernia without obstruction or gangrene: Secondary | ICD-10-CM | POA: Diagnosis present

## 2020-09-14 DIAGNOSIS — R110 Nausea: Secondary | ICD-10-CM | POA: Diagnosis not present

## 2020-09-14 DIAGNOSIS — K5520 Angiodysplasia of colon without hemorrhage: Secondary | ICD-10-CM | POA: Diagnosis not present

## 2020-09-14 DIAGNOSIS — Z7984 Long term (current) use of oral hypoglycemic drugs: Secondary | ICD-10-CM

## 2020-09-14 DIAGNOSIS — Z66 Do not resuscitate: Secondary | ICD-10-CM | POA: Diagnosis not present

## 2020-09-14 DIAGNOSIS — H8110 Benign paroxysmal vertigo, unspecified ear: Secondary | ICD-10-CM | POA: Diagnosis present

## 2020-09-14 DIAGNOSIS — K5731 Diverticulosis of large intestine without perforation or abscess with bleeding: Secondary | ICD-10-CM | POA: Diagnosis present

## 2020-09-14 DIAGNOSIS — K31811 Angiodysplasia of stomach and duodenum with bleeding: Secondary | ICD-10-CM | POA: Diagnosis not present

## 2020-09-14 DIAGNOSIS — K222 Esophageal obstruction: Secondary | ICD-10-CM | POA: Diagnosis present

## 2020-09-14 DIAGNOSIS — E785 Hyperlipidemia, unspecified: Secondary | ICD-10-CM | POA: Diagnosis present

## 2020-09-14 DIAGNOSIS — D62 Acute posthemorrhagic anemia: Secondary | ICD-10-CM | POA: Diagnosis present

## 2020-09-14 DIAGNOSIS — H8113 Benign paroxysmal vertigo, bilateral: Secondary | ICD-10-CM | POA: Diagnosis not present

## 2020-09-14 LAB — TYPE AND SCREEN
ABO/RH(D): O POS
Antibody Screen: NEGATIVE

## 2020-09-14 LAB — COMPREHENSIVE METABOLIC PANEL
ALT: 10 U/L (ref 0–44)
AST: 14 U/L — ABNORMAL LOW (ref 15–41)
Albumin: 3.1 g/dL — ABNORMAL LOW (ref 3.5–5.0)
Alkaline Phosphatase: 61 U/L (ref 38–126)
Anion gap: 6 (ref 5–15)
BUN: 16 mg/dL (ref 8–23)
CO2: 24 mmol/L (ref 22–32)
Calcium: 7.8 mg/dL — ABNORMAL LOW (ref 8.9–10.3)
Chloride: 109 mmol/L (ref 98–111)
Creatinine, Ser: 0.86 mg/dL (ref 0.61–1.24)
GFR, Estimated: >60 mL/min (ref >60)
Glucose, Bld: 204 mg/dL — ABNORMAL HIGH (ref 70–99)
Potassium: 3.9 mmol/L (ref 3.5–5.1)
Sodium: 139 mmol/L (ref 135–145)
Total Bilirubin: 0.5 mg/dL (ref 0.3–1.2)
Total Protein: 5.2 g/dL — ABNORMAL LOW (ref 6.5–8.1)

## 2020-09-14 LAB — CBC WITH DIFFERENTIAL/PLATELET
Abs Immature Granulocytes: 0.09 10*3/uL — ABNORMAL HIGH (ref 0.00–0.07)
Basophils Absolute: 0.1 10*3/uL (ref 0.0–0.1)
Basophils Relative: 1 %
Eosinophils Absolute: 0.9 10*3/uL — ABNORMAL HIGH (ref 0.0–0.5)
Eosinophils Relative: 7 %
HCT: 35.4 % — ABNORMAL LOW (ref 39.0–52.0)
Hemoglobin: 11 g/dL — ABNORMAL LOW (ref 13.0–17.0)
Immature Granulocytes: 1 %
Lymphocytes Relative: 17 %
Lymphs Abs: 2.3 10*3/uL (ref 0.7–4.0)
MCH: 30.9 pg (ref 26.0–34.0)
MCHC: 31.1 g/dL (ref 30.0–36.0)
MCV: 99.4 fL (ref 80.0–100.0)
Monocytes Absolute: 1 10*3/uL (ref 0.1–1.0)
Monocytes Relative: 8 %
Neutro Abs: 8.8 10*3/uL — ABNORMAL HIGH (ref 1.7–7.7)
Neutrophils Relative %: 66 %
Platelets: 325 10*3/uL (ref 150–400)
RBC: 3.56 MIL/uL — ABNORMAL LOW (ref 4.22–5.81)
RDW: 13.7 % (ref 11.5–15.5)
WBC: 13.2 10*3/uL — ABNORMAL HIGH (ref 4.0–10.5)
nRBC: 0 % (ref 0.0–0.2)

## 2020-09-14 LAB — URINALYSIS, ROUTINE W REFLEX MICROSCOPIC
Bilirubin Urine: NEGATIVE
Glucose, UA: NEGATIVE mg/dL
Hgb urine dipstick: NEGATIVE
Ketones, ur: NEGATIVE mg/dL
Leukocytes,Ua: NEGATIVE
Nitrite: NEGATIVE
Protein, ur: NEGATIVE mg/dL
Specific Gravity, Urine: 1.018 (ref 1.005–1.030)
pH: 6 (ref 5.0–8.0)

## 2020-09-14 LAB — RESP PANEL BY RT-PCR (FLU A&B, COVID) ARPGX2
Influenza A by PCR: NEGATIVE
Influenza B by PCR: NEGATIVE
SARS Coronavirus 2 by RT PCR: NEGATIVE

## 2020-09-14 LAB — GLUCOSE, CAPILLARY
Glucose-Capillary: 105 mg/dL — ABNORMAL HIGH (ref 70–99)
Glucose-Capillary: 104 mg/dL — ABNORMAL HIGH (ref 70–99)
Glucose-Capillary: 125 mg/dL — ABNORMAL HIGH (ref 70–99)

## 2020-09-14 LAB — POC OCCULT BLOOD, ED: Fecal Occult Bld: POSITIVE — AB

## 2020-09-14 LAB — LIPASE, BLOOD: Lipase: 21 U/L (ref 11–51)

## 2020-09-14 MED ORDER — LACTATED RINGERS IV SOLN: INTRAVENOUS | Status: DC

## 2020-09-14 MED ORDER — PREDNISONE 5 MG PO TABS
5.0000 mg | ORAL_TABLET | Freq: Every day | ORAL | Status: DC
  Administered 2020-09-15 – 2020-09-17 (×3): 5 mg via ORAL
  Filled 2020-09-14 (×3): qty 1

## 2020-09-14 MED ORDER — ALBUTEROL SULFATE (2.5 MG/3ML) 0.083% IN NEBU: 3.0000 mL | INHALATION_SOLUTION | Freq: Four times a day (QID) | RESPIRATORY_TRACT | Status: DC | PRN

## 2020-09-14 MED ORDER — ACETAMINOPHEN 650 MG RE SUPP: 650.0000 mg | Freq: Four times a day (QID) | RECTAL | Status: DC | PRN

## 2020-09-14 MED ORDER — ACETAMINOPHEN 325 MG PO TABS
650.0000 mg | ORAL_TABLET | Freq: Four times a day (QID) | ORAL | Status: DC | PRN
  Administered 2020-09-14 – 2020-09-16 (×3): 650 mg via ORAL
  Filled 2020-09-14: qty 2

## 2020-09-14 MED ORDER — LIP MEDEX EX OINT
TOPICAL_OINTMENT | CUTANEOUS | Status: AC
  Filled 2020-09-14: qty 7

## 2020-09-14 MED ORDER — LACTATED RINGERS IV BOLUS
500.0000 mL | Freq: Once | INTRAVENOUS | Status: AC
  Administered 2020-09-14: 500 mL via INTRAVENOUS

## 2020-09-14 MED ORDER — SIMVASTATIN 20 MG PO TABS
40.0000 mg | ORAL_TABLET | Freq: Every day | ORAL | Status: DC
  Administered 2020-09-15 – 2020-09-17 (×3): 40 mg via ORAL
  Filled 2020-09-14 (×3): qty 2

## 2020-09-14 MED ORDER — ROPINIROLE HCL 1 MG PO TABS
3.0000 mg | ORAL_TABLET | Freq: Every day | ORAL | Status: DC
  Administered 2020-09-14 – 2020-09-16 (×3): 3 mg via ORAL
  Filled 2020-09-14 (×3): qty 3

## 2020-09-14 MED ORDER — MORPHINE SULFATE (PF) 2 MG/ML IV SOLN: 2.0000 mg | INTRAVENOUS | Status: DC | PRN

## 2020-09-14 MED ORDER — METOPROLOL TARTRATE 5 MG/5ML IV SOLN: 5.0000 mg | Freq: Four times a day (QID) | INTRAVENOUS | Status: DC | PRN

## 2020-09-14 MED ORDER — BUDESONIDE 0.5 MG/2ML IN SUSP
2.0000 mL | Freq: Two times a day (BID) | RESPIRATORY_TRACT | Status: DC
  Administered 2020-09-14 – 2020-09-17 (×6): 0.5 mg via RESPIRATORY_TRACT
  Filled 2020-09-14 (×6): qty 2

## 2020-09-14 MED ORDER — FLUTICASONE PROPIONATE 50 MCG/ACT NA SUSP
1.0000 | Freq: Every day | NASAL | Status: DC | PRN
  Filled 2020-09-14: qty 16

## 2020-09-14 MED ORDER — PANTOPRAZOLE SODIUM 40 MG PO TBEC
40.0000 mg | DELAYED_RELEASE_TABLET | Freq: Every day | ORAL | Status: DC
  Administered 2020-09-15 – 2020-09-17 (×3): 40 mg via ORAL
  Filled 2020-09-14 (×3): qty 1

## 2020-09-14 MED ORDER — TRAMADOL HCL 50 MG PO TABS: 50.0000 mg | ORAL_TABLET | Freq: Every day | ORAL | Status: DC | PRN

## 2020-09-14 MED ORDER — TRAZODONE HCL 50 MG PO TABS
25.0000 mg | ORAL_TABLET | Freq: Every evening | ORAL | Status: DC | PRN
  Administered 2020-09-14: 25 mg via ORAL
  Filled 2020-09-14: qty 1

## 2020-09-14 MED ORDER — METFORMIN HCL 500 MG PO TABS
500.0000 mg | ORAL_TABLET | Freq: Every day | ORAL | Status: DC
  Administered 2020-09-15 – 2020-09-17 (×2): 500 mg via ORAL
  Filled 2020-09-14 (×2): qty 1

## 2020-09-14 MED ORDER — INSULIN ASPART 100 UNIT/ML IJ SOLN: 0.0000 [IU] | Freq: Three times a day (TID) | INTRAMUSCULAR | Status: DC

## 2020-09-14 NOTE — ED Notes (Signed)
Patient wife [name] was give update on patient status.

## 2020-09-14 NOTE — ED Notes (Signed)
RN sent down extra blue, 2 gold tops, gray top to lab.

## 2020-09-14 NOTE — ED Triage Notes (Signed)
Patient here from home, [facility] - independent living, reporting abd pain that started tonight. Hx of diverticulitis treated 6 weeks ago for same. 500 of fluid given in route.

## 2020-09-14 NOTE — ED Notes (Signed)
Call 3 E to confirm bed being ready for the patient. Bed not approved yet at this time they will let me known.

## 2020-09-14 NOTE — ED Notes (Signed)
Patient states that he has had dark red blood from bottom for 1 day. Seen 7/3 for same.

## 2020-09-14 NOTE — ED Notes (Signed)
Warm blanket provided.

## 2020-09-14 NOTE — ED Provider Notes (Signed)
[name]   CSN: [id number] Arrival date & time: 09/14/20  0503     History Chief Complaint  Patient presents with   Abdominal Pain    [name] is a 87 y.o. male.  The history is provided by the patient.  Abdominal Pain He has history of hypertension, diabetes, combined systolic and diastolic heart failure, paroxysmal atrial fibrillation, diverticulosis, AV malformation of the small bowel, GI bleeding and comes in with passing black stool tonight.  This is associated with some weakness and lightheadedness but he denies abdominal pain.  There was some nausea but no vomiting.  He is on low-dose aspirin but not on any systemic anticoagulants.  He was recently admitted for a GI bleed which was felt to be secondary to diverticulosis.  He was brought in by ambulance and was given 500 mL of IV fluid in route.   Past Medical History:  Diagnosis Date   Acute CVA (cerebrovascular accident) ([place]) 07/25/2015   Asthma    Benign essential HTN    Cerebral infarction due to embolism of left middle cerebral artery ([place]) 02/03/2014   Chronic combined systolic and diastolic CHF (congestive heart failure) ([place]) 12/23/2017   Colitis    Colon polyps    Diabetes mellitus type 2, diet-controlled ([place]) 12/22/2013   Diverticulosis    GERD (gastroesophageal reflux disease)    GI bleed    GI bleed    Hemorrhoids    Hiatal hernia    History of esophageal strciture    HLD (hyperlipidemia)    Osteoarthritis    Proctitis    Status post dilation of esophageal narrowing    Stroke [facility])     Patient Active Problem List   Diagnosis Date Noted   AVM (arteriovenous malformation) of small bowel, acquired with hemorrhage    Acute GI bleeding 01/06/2020   Protein-calorie malnutrition, severe ([place]) 04/01/2019   DNR (do not resuscitate) 04/01/2019   Rectal bleed 08/21/2018   Platelet inhibition due to Plavix    Rectal bleeding    Diverticulosis large  intestine w/o perforation or abscess w/bleeding    GERD (gastroesophageal reflux disease) 04/21/2018   Hematochezia    Hypokalemia 12/25/2017   Diverticulosis    Acute blood loss anemia 12/24/2017   Syncope due to orthostatic hypotension 12/23/2017   Chronic combined systolic and diastolic CHF (congestive heart failure) ([place]) 12/23/2017   Postural dizziness with presyncope 10/25/2016   GI bleed 10/25/2016   BPPV (benign paroxysmal positional vertigo) 10/25/2016   Lower GI bleed 10/24/2016   Syncope 10/24/2016   TIA (transient ischemic attack) 07/22/2016   Benign essential HTN    PAF (paroxysmal atrial fibrillation) ([facility])    Stroke-like symptom 02/22/2016   Leukocytosis 02/22/2016   Acute lower GI bleeding 02/01/2016   History of lower GI bleeding Dec 2017 02/01/2016   Chronic anticoagulation 2/2 history of CVA 01/31/2016   History of CVA (cerebrovascular accident) 01/31/2016   Sinusitis, chronic 12/12/2015   Cough variant asthma 10/31/2015   Insomnia 07/25/2015   HLD (hyperlipidemia) 02/03/2014   Ulnar neuropathy at elbow of right upper extremity 02/03/2014   Cervical radiculopathy 02/03/2014   Seizures ([place])    Diabetes mellitus type II, non insulin dependent ([place]) 12/22/2013   Recurrent ventral hernia 12/15/2011   History of esophageal strciture    Diverticulosis of colon with hemorrhage 06/24/2007    Past Surgical History:  Procedure Laterality Date   ABDOMINAL HERNIA REPAIR     BIOPSY  04/05/2019  Procedure: BIOPSY;  Surgeon: Thornton Stankovic, MD;  Location[facility] ENDOSCOPY;  Service: Gastroenterology;;   COLONOSCOPY     multiple   COLONOSCOPY WITH PROPOFOL N/A 12/12/2016   Procedure: COLONOSCOPY WITH PROPOFOL;  Surgeon: Gatha Mayer, MD;  Location: [place];  Service: Endoscopy;  Laterality: N/A;   COLONOSCOPY WITH PROPOFOL N/A 04/04/2019   Procedure: COLONOSCOPY WITH PROPOFOL;  Surgeon: Milus Banister, MD;  Location: [facility] ENDOSCOPY;  Service: Endoscopy;   Laterality: N/A;   ENTEROSCOPY N/A 01/16/2020   Procedure: ENTEROSCOPY;  Surgeon: Rush Landmark Telford Nab., MD;  Location: [facility] ENDOSCOPY;  Service: Gastroenterology;  Laterality: N/A;   ENTEROSCOPY N/A 01/20/2020   Procedure: ENTEROSCOPY;  Surgeon: Rush Landmark Telford Nab., MD;  Location: [facility] ENDOSCOPY;  Service: Gastroenterology;  Laterality: N/A;   ESOPHAGOGASTRODUODENOSCOPY     multiple   ESOPHAGOGASTRODUODENOSCOPY (EGD) WITH PROPOFOL N/A 04/05/2019   Procedure: ESOPHAGOGASTRODUODENOSCOPY (EGD) WITH PROPOFOL;  Surgeon: Thornton Wesolowski, MD;  Location: [place];  Service: Gastroenterology;  Laterality: N/A;   ESOPHAGOGASTRODUODENOSCOPY (EGD) WITH PROPOFOL N/A 01/18/2020   Procedure: ESOPHAGOGASTRODUODENOSCOPY (EGD) WITH PROPOFOL;  Surgeon: Doran Stabler, MD;  Location: [facility] ENDOSCOPY;  Service: Gastroenterology;  Laterality: N/A;  For placement of video capsule study   GIVENS CAPSULE STUDY N/A 01/16/2020   Procedure: [place];  Surgeon: Irving Copas., MD;  Location: [facility] ENDOSCOPY;  Service: Gastroenterology;  Laterality: N/A;   GIVENS CAPSULE STUDY N/A 01/18/2020   Procedure: GIVENS CAPSULE STUDY;  Surgeon: Doran Stabler, MD;  Location: [facility] ENDOSCOPY;  Service: Gastroenterology;  Laterality: N/A;   HOT HEMOSTASIS N/A 01/20/2020   Procedure: HOT HEMOSTASIS (ARGON PLASMA COAGULATION/BICAP);  Surgeon: Irving Copas., MD;  Location: [facility] ENDOSCOPY;  Service: Gastroenterology;  Laterality: N/A;   INSERTION OF MESH  01/29/2012   Procedure: INSERTION OF MESH;  Surgeon: Gwenyth Ober, MD;  Location: [place];  Service: General;  Laterality: N/A;   IR ANGIOGRAM SELECTIVE EACH ADDITIONAL VESSEL  04/01/2019   IR ANGIOGRAM VISCERAL SELECTIVE  04/01/2019   IR ANGIOGRAM VISCERAL SELECTIVE  04/01/2019   IR ANGIOGRAM VISCERAL SELECTIVE  04/01/2019   IR US GUIDE VASC ACCESS RIGHT  [id number]   NISSEN FUNDOPLICATION     SAVORY DILATION N/A 01/16/2020   Procedure: SAVORY DILATION;   Surgeon: Irving Copas., MD;  Location: [facility] ENDOSCOPY;  Service: Gastroenterology;  Laterality: N/A;   SCLEROTHERAPY  01/20/2020   Procedure: SCLEROTHERAPY;  Surgeon: Mansouraty, Telford Nab., MD;  Location: [facility] ENDOSCOPY;  Service: Gastroenterology;;   SPLENECTOMY, TOTAL     nontraumatic rupture   SUBMUCOSAL TATTOO INJECTION  01/16/2020   Procedure: SUBMUCOSAL TATTOO INJECTION;  Surgeon: Irving Copas., MD;  Location: [facility] ENDOSCOPY;  Service: Gastroenterology;;   VENTRAL HERNIA REPAIR  01/29/2012    WITH MESH   VENTRAL HERNIA REPAIR  01/29/2012   Procedure: HERNIA REPAIR VENTRAL ADULT;  Surgeon: Gwenyth Ober, MD;  Location: [place];  Service: General;  Laterality: N/A;  open recurrent ventral hernia repair with mesh       Family History  Problem Relation Age of Onset   Alzheimer's disease Mother    Breast cancer Mother    Throat cancer Father    Colon cancer Neg Hx    Colon polyps Neg Hx    Pancreatic cancer Neg Hx    Stomach cancer Neg Hx    Liver disease Neg Hx     Social History   Tobacco Use   Smoking status: Never   Smokeless tobacco: Never  Substance  Use Topics   Alcohol use: Yes    Alcohol/week: 2.0 standard drinks    Types: 2 Glasses of wine per week    Comment: daily   Drug use: No    Home Medications Prior to Admission medications   Medication Sig Start Date End Date Taking? Authorizing Provider  albuterol (PROVENTIL) (2.5 MG/3ML) 0.083% nebulizer solution Take 3 mLs by nebulization every 6 (six) hours as needed for wheezing or shortness of breath.  10/04/18   [provider]  albuterol (VENTOLIN HFA) 108 (90 Base) MCG/ACT inhaler Inhale 2 puffs into the lungs every 6 (six) hours as needed for wheezing or shortness of breath.    [provider]  aspirin EC 81 MG tablet Take 1 tablet (81 mg total) by mouth daily. Swallow whole. 08/20/20 08/20/21  Rai, Ripudeep K, MD  budesonide (PULMICORT) 0.5 MG/2ML nebulizer solution Take 2 mLs by  nebulization 2 (two) times daily. 09/01/18   [provider]  Carboxymethylcellul-Glycerin (LUBRICATING EYE DROPS OP) Apply 1 drop to eye as needed (dry eyes, eye discomfort).    [provider]  Cholecalciferol (VITAMIN D3 PO) Take 1 tablet by mouth daily.    [provider]  Cyanocobalamin (VITAMIN B-12 PO) Take 1 tablet by mouth daily.    [provider]  diphenhydramine-acetaminophen (TYLENOL PM) 25-500 MG TABS tablet Take 1 tablet by mouth at bedtime.    [provider]  ferrous sulfate 325 (65 FE) MG tablet Take 325 mg by mouth daily with breakfast.    [provider]  fluticasone (FLONASE) 50 MCG/ACT nasal spray Place 1 spray into both nostrils daily as needed for allergies or rhinitis.  06/18/14   [provider]  Melatonin 5 MG CAPS Take 5 mg by mouth at bedtime.    [provider]  metFORMIN (GLUCOPHAGE) 500 MG tablet Take 500 mg by mouth daily. 08/23/19   [provider]  Multiple Vitamins-Minerals (ZINC PO) Take 1 tablet by mouth daily.    [provider]  pantoprazole (PROTONIX) 40 MG tablet Take 1 tablet (40 mg total) by mouth daily. 04/22/20   Milus Banister, MD  polyethylene glycol [facility] / Floria Raveling) packet Take 17 g by mouth daily as needed for mild constipation (MIX AND DRINK).     [provider]  predniSONE (DELTASONE) 10 MG tablet Take 5 mg by mouth daily.  07/11/19   [provider]  psyllium (METAMUCIL) 58.6 % powder Take 1 packet by mouth daily as needed (for constipation- MIX AND DRINK).     [provider]  rOPINIRole (REQUIP) 3 MG tablet Take 1 tablet (3 mg total) by mouth at bedtime. 10/26/19   Frann Rider, NP  simethicone (MYLICON) 80 MG chewable tablet Chew 1 tablet (80 mg total) by mouth 4 (four) times daily as needed for flatulence. Also available over-the-counter 08/20/20   Rai, Vernelle Emerald, MD  simvastatin (ZOCOR) 40 MG tablet Take 1 tablet (40 mg total) by  mouth daily. 02/26/16   Amin, Jeanella Flattery, MD  traMADol (ULTRAM) 50 MG tablet Take 1 tablet (50 mg total) by mouth daily as needed for moderate pain. 01/22/20   Lavina Hamman, MD  vitamin C (ASCORBIC ACID) 500 MG tablet Take 500 mg by mouth daily.    [provider]    Allergies    Tape  Review of Systems   Review of Systems  Gastrointestinal:  Positive for abdominal pain.  All other systems reviewed and are negative.  Physical  Exam Updated Vital Signs BP 90/61 (BP Location: Right Arm)   Pulse 75   Temp 98.6 F (37 C) (Oral)   Resp 18   SpO2 97%   Physical Exam Vitals and nursing [name] reviewed.  87 year old male, resting comfortably and in no acute distress. Vital signs are significant for low blood pressure. Oxygen saturation is 97%, which is normal. Head is normocephalic and atraumatic. PERRLA, EOMI. Oropharynx is clear.  Conjunctivae are pink. Neck is nontender and supple without adenopathy or JVD. Back is nontender and there is no CVA tenderness. Lungs are clear without rales, wheezes, or rhonchi. Chest is nontender. Heart has regular rate and rhythm without murmur. Abdomen is soft, flat, nontender without masses or hepatosplenomegaly and peristalsis is normoactive. Rectal: Normal sphincter tone.  Small amount of bright red blood present in the rectal vault. Extremities have no cyanosis or edema, full range of motion is present. Skin is warm and dry without rash. Neurologic: Mental status is normal, cranial nerves are intact, moves all extremities equally.  ED Results / Procedures / Treatments   Labs (all labs ordered are listed, but only abnormal results are displayed) Labs Reviewed  COMPREHENSIVE METABOLIC PANEL - Abnormal; Notable for the following components:      Result Value   Glucose, Bld 204 (*)    Calcium 7.8 (*)    Total Protein 5.2 (*)    Albumin 3.1 (*)    AST 14 (*)    All other components within normal limits  CBC WITH DIFFERENTIAL/PLATELET  - Abnormal; Notable for the following components:   WBC 13.2 (*)    RBC 3.56 (*)    Hemoglobin 11.0 (*)    HCT 35.4 (*)    Neutro Abs 8.8 (*)    Eosinophils Absolute 0.9 (*)    Abs Immature Granulocytes 0.09 (*)    All other components within normal limits  POC OCCULT BLOOD, ED - Abnormal; Notable for the following components:   Fecal Occult Bld POSITIVE (*)    All other components within normal limits  RESP PANEL BY RT-PCR (FLU A&B, COVID) [id number]  LIPASE, BLOOD  URINALYSIS, ROUTINE W REFLEX MICROSCOPIC  TYPE AND SCREEN   Procedures Procedures   Medications Ordered in ED Medications  lactated ringers bolus 500 mL (500 mLs Intravenous New Bag/Given 09/14/20 0554)    ED Course  I have reviewed the triage vital signs and the nursing notes.  Pertinent labs & imaging results that were available during my care of the patient were reviewed by me and considered in my medical decision making (see chart for details).   MDM Rules/Calculators/A&P                         GI bleed in patient with history of GI bleeding from AV malformation in the small bowel and diverticulosis.  Blood is bright red on my exam which would be more consistent with diverticulosis.  Old records are reviewed confirming recent hospitalization for GI bleed, but no transfusion given.  We will need to check hemoglobin and he will need to be readmitted.  He was given IV fluids, blood pressure remained in the 90s.  He was nontachycardic and not in any distress.  He has not had any further bloody stools.  Hemoglobin has come back with a 2.5 g drop compared with 08/20/2020.  Case is discussed with Dr. Kennon Rounds of [facility], who agrees to admit the patient.  Final Clinical Impression(s) /  ED Diagnoses Final diagnoses:  Lower gastrointestinal bleed    Rx / DC Orders ED Discharge Orders     None        Delora Fuel, MD [id number] [phone]

## 2020-09-14 NOTE — H&P (Signed)
History and Physical    ASHA SEARS [id number] DOB: [birth year] DOA: 09/14/2020  PCP: Seward Carol, MD  Patient coming from: Home, patient lives in [place] with his wife  I have personally briefly reviewed patient's old medical records in [place]  Chief Complaint: Rectal bleeding  HPI: [name] is a 87 y.o. male with medical history significant of type 2 diabetes, GERD, hyperlipidemia, diastolic CHF, prior CVA with multiple prior GI bleeds, last admitted in early July for similar presentation.  He reports awakening this morning at 4 AM and having frank rectal bleeding.  He thinks he lost a lot of blood while he was sitting on the toilet for a while.  He had a second episode while at home.  He came to the ED for evaluation. ED Course: In the ED was noted to have a fall in hemoglobin from 13.5-11.0.  He also had soft blood pressures in the 90s over 60s and received a liter bolus of IV fluids.  He had no further ongoing bleeding.  Hospitalist were asked to admit due to drop in hemoglobin and soft blood pressures and fears of continued bleeding.  Review of Systems: As per HPI otherwise 10 point review of systems negative.   Past Medical History:  Diagnosis Date   Acute CVA (cerebrovascular accident) ([place]) 07/25/2015   Asthma    Benign essential HTN    Cerebral infarction due to embolism of left middle cerebral artery ([place]) 02/03/2014   Chronic combined systolic and diastolic CHF (congestive heart failure) ([place]) 12/23/2017   Colitis    Colon polyps    Diabetes mellitus type 2, diet-controlled ([place]) 12/22/2013   Diverticulosis    GERD (gastroesophageal reflux disease)    GI bleed    GI bleed    Hemorrhoids    Hiatal hernia    History of esophageal strciture    HLD (hyperlipidemia)    Osteoarthritis    Proctitis    Status post dilation of esophageal narrowing    Stroke [facility])     Past Surgical History:  Procedure Laterality Date   ABDOMINAL HERNIA REPAIR      BIOPSY  04/05/2019   Procedure: BIOPSY;  Surgeon: Thornton Rembert, MD;  Location: [place];  Service: Gastroenterology;;   COLONOSCOPY     multiple   COLONOSCOPY WITH PROPOFOL N/A 12/12/2016   Procedure: COLONOSCOPY WITH PROPOFOL;  Surgeon: Gatha Mayer, MD;  Location: [facility] ENDOSCOPY;  Service: Endoscopy;  Laterality: N/A;   COLONOSCOPY WITH PROPOFOL N/A 04/04/2019   Procedure: COLONOSCOPY WITH PROPOFOL;  Surgeon: Milus Banister, MD;  Location[name] END[facility]COPY;  Service: Endoscopy;  Laterality: N/A;   ENTEROSCOPY N/A 01/16/2020   Procedure: ENTEROSCOPY;  Surgeon: Rush Landmark Telford Nab., MD;  Location: [facility] ENDOSCOPY;  Service: Gastroenterology;  Laterality: N/A;   ENTEROSCOPY N/A 01/20/2020   Procedure: ENTEROSCOPY;  Surgeon: Rush Landmark Telford Nab., MD;  Location: [facility] ENDOSCOPY;  Service: Gastroenterology;  Laterality: N/A;   ESOPHAGOGASTRODUODENOSCOPY     multiple   ESOPHAGOGASTRODUODENOSCOPY (EGD) WITH PROPOFOL N/A 04/05/2019   Procedure: ESOPHAGOGASTRODUODENOSCOPY (EGD) WITH PROPOFOL;  Surgeon: Thornton Barrilleaux, MD;  Location: [place];  Service: Gastroenterology;  Laterality: N/A;   ESOPHAGOGASTRODUODENOSCOPY (EGD) WITH PROPOFOL N/A 01/18/2020   Procedure: ESOPHAGOGASTRODUODENOSCOPY (EGD) WITH PROPOFOL;  Surgeon: Doran Stabler, MD;  Location: [facility] ENDOSCOPY;  Service: Gastroenterology;  Laterality: N/A;  For placement of video capsule study   GIVENS CAPSULE STUDY N/A 01/16/2020   Procedure: [place];  Surgeon: Irving Copas., MD;  Location: [facility] ENDOSCOPY;  Service: Gastroenterology;  Laterality: N/A;   GIVENS CAPSULE STUDY N/A 01/18/2020   Procedure: GIVENS CAPSULE STUDY;  Surgeon: Doran Stabler, MD;  Location: [facility] ENDOSCOPY;  Service: Gastroenterology;  Laterality: N/A;   HOT HEMOSTASIS N/A 01/20/2020   Procedure: HOT HEMOSTASIS (ARGON PLASMA COAGULATION/BICAP);  Surgeon: Irving Copas., MD;  Location: [facility]OS[facility];  Service: Gastroenterology;   Laterality: N/A;   INSERTION OF MESH  01/29/2012   Procedure: INSERTION OF MESH;  Surgeon: Gwenyth Ober, MD;  Location: [place];  Service: General;  Laterality: N/A;   IR ANGIOGRAM SELECTIVE EACH ADDITIONAL VESSEL  04/01/2019   IR ANGIOGRAM VISCERAL SELECTIVE  04/01/2019   IR ANGIOGRAM VISCERAL SELECTIVE  04/01/2019   IR ANGIOGRAM VISCERAL SELECTIVE  04/01/2019   IR US GUIDE VASC ACCESS RIGHT  [id number]   NISSEN FUNDOPLICATION     SAVORY DILATION N/A 01/16/2020   Procedure: SAVORY DILATION;  Surgeon: Irving Copas., MD;  Location: [facility] ENDOSCOPY;  Service: Gastroenterology;  Laterality: N/A;   SCLEROTHERAPY  01/20/2020   Procedure: SCLEROTHERAPY;  Surgeon: Mansouraty, Telford Nab., MD;  Location: [facility] ENDOSCOPY;  Service: Gastroenterology;;   SPLENECTOMY, TOTAL     nontraumatic rupture   SUBMUCOSAL TATTOO INJECTION  01/16/2020   Procedure: SUBMUCOSAL TATTOO INJECTION;  Surgeon: Irving Copas., MD;  Location: [facility] ENDOS[facility];  Service: Gastroenterology;;   VENTRAL HERNIA REPAIR  01/29/2012    WITH MESH   VENTRAL HERNIA REPAIR  01/29/2012   Procedure: HERNIA REPAIR VENTRAL ADULT;  Surgeon: Gwenyth Ober, MD;  Location: [place];  Service: General;  Laterality: N/A;  open recurrent ventral hernia repair with mesh     reports that he has never smoked. He has never used smokeless tobacco. He reports current alcohol use of about 2.0 standard drinks of alcohol per week. He reports that he does not use drugs.  Allergies  Allergen Reactions   Tape Other (See Comments)    SKIN IS SENSITIVE; PLEASE USE PAPER TAPE; SKIN BRUISES AND TEARS EASILY!!    Family History  Problem Relation Age of Onset   Alzheimer's disease Mother    Breast cancer Mother    Throat cancer Father    Colon cancer Neg Hx    Colon polyps Neg Hx    Pancreatic cancer Neg Hx    Stomach cancer Neg Hx    Liver disease Neg Hx      Prior to Admission medications   Medication Sig Start Date End Date Taking? Authorizing  Provider  albuterol (PROVENTIL) (2.5 MG/3ML) 0.083% nebulizer solution Take 3 mLs by nebulization every 6 (six) hours as needed for wheezing or shortness of breath.  10/04/18   [provider]  albuterol (VENTOLIN HFA) 108 (90 Base) MCG/ACT inhaler Inhale 2 puffs into the lungs every 6 (six) hours as needed for wheezing or shortness of breath.    [provider]  aspirin EC 81 MG tablet Take 1 tablet (81 mg total) by mouth daily. Swallow whole. 08/20/20 08/20/21  Rai, Ripudeep K, MD  budesonide (PULMICORT) 0.5 MG/2ML nebulizer solution Take 2 mLs by nebulization 2 (two) times daily. 09/01/18   [provider]  Carboxymethylcellul-Glycerin (LUBRICATING EYE DROPS OP) Apply 1 drop to eye as needed (dry eyes, eye discomfort).    [provider]  Cholecalciferol (VITAMIN D3 PO) Take 1 tablet by mouth daily.    [provider]  Cyanocobalamin (VITAMIN B-12 PO) Take 1 tablet by mouth daily.    [provider]  diphenhydramine-acetaminophen (TYLENOL PM) 25-500 MG TABS tablet Take 1 tablet by mouth at bedtime.    [provider]  ferrous sulfate 325 (65 FE) MG tablet Take 325 mg by mouth daily with breakfast.    [provider]  fluticasone (FLONASE) 50 MCG/ACT nasal spray Place 1 spray into both nostrils daily as needed for allergies or rhinitis.  06/18/14   [provider]  Melatonin 5 MG CAPS Take 5 mg by mouth at bedtime.    [provider]  metFORMIN (GLUCOPHAGE) 500 MG tablet Take 500 mg by mouth daily. 08/23/19   [provider]  Multiple Vitamins-Minerals (ZINC PO) Take 1 tablet by mouth daily.    [provider]  pantoprazole (PROTONIX) 40 MG tablet Take 1 tablet (40 mg total) by mouth daily. 04/22/20   Milus Banister, MD  polyethylene glycol [facility] / Floria Raveling) packet Take 17 g by mouth daily as needed for mild constipation (MIX AND DRINK).     [provider]  predniSONE (DELTASONE) 10 MG  tablet Take 5 mg by mouth daily.  07/11/19   [provider]  psyllium (METAMUCIL) 58.6 % powder Take 1 packet by mouth daily as needed (for constipation- MIX AND DRINK).     [provider]  rOPINIRole (REQUIP) 3 MG tablet Take 1 tablet (3 mg total) by mouth at bedtime. 10/26/19   Frann Rider, NP  simethicone (MYLICON) 80 MG chewable tablet Chew 1 tablet (80 mg total) by mouth 4 (four) times daily as needed for flatulence. Also available over-the-counter 08/20/20   Rai, Vernelle Emerald, MD  simvastatin (ZOCOR) 40 MG tablet Take 1 tablet (40 mg total) by mouth daily. 02/26/16   Amin, Jeanella Flattery, MD  traMADol (ULTRAM) 50 MG tablet Take 1 tablet (50 mg total) by mouth daily as needed for moderate pain. 01/22/20   Lavina Hamman, MD  vitamin C (ASCORBIC ACID) 500 MG tablet Take 500 mg by mouth daily.    [provider]    Physical Exam: Constitutional: NAD, calm, comfortable Vitals:   09/14/20 0525 09/14/20 0530 09/14/20 0630 09/14/20 0705  BP: 90/61 96/62 (!) 93/52 (!) 128/58  Pulse: 65 66 76 81  Resp: '17 16 18 '$ (!) 23  Temp:      TempSrc:      SpO2: 97% 99% 99% 93%   Eyes: PERRL, lids and conjunctivae normal ENMT: Mucous membranes are dry. Poor dentition.  Neck: normal, supple, no masses Respiratory: clear to auscultation bilaterally, no wheezing, no crackles. Normal respiratory effort. No accessory muscle use.  Cardiovascular: Regular rate and rhythm, no murmurs / rubs / gallops. No extremity edema. 2+ pedal pulses.  Abdomen: no tenderness, no masses palpated. No hepatosplenomegaly. Bowel sounds positive.  Musculoskeletal: no clubbing / cyanosis. No joint deformity upper and lower extremities.  Skin: no rashes, lesions, ulcers. No induration Neurologic: Left-sided weakness Psychiatric: Normal judgment and insight. Alert and oriented x 3. Normal mood.   Labs on Admission: I have personally reviewed following labs and imaging studies  CBC: Recent Labs  Lab  09/14/20 0540  WBC 13.2*  NEUTROABS 8.8*  HGB 11.0*  HCT 35.4*  MCV 99.4  PLT [id number]   Basic Metabolic Panel: Recent Labs  Lab 09/14/20 0540  NA 139  K 3.9  CL 109  CO2 24  GLUCOSE 204*  BUN 16  CREATININE 0.86  CALCIUM 7.8*   GFR: CrCl cannot be calculated (Unknown ideal weight.). Liver Function Tests: Recent Labs  Lab 09/14/20 0540  AST 14*  ALT 10  ALKPHOS 61  BILITOT 0.5  PROT 5.2*  ALBUMIN 3.1*   Recent Labs  Lab 09/14/20 0540  LIPASE 21    Urine analysis:    Component Value Date/Time   COLORURINE STRAW (A) 04/21/2018 2320   APPEARANCEUR CLEAR 04/21/2018 2320   LABSPEC 1.013 04/21/2018 2320   PHURINE 6.0 04/21/2018 2320   GLUCOSEU NEGATIVE 04/21/2018 2320   HGBUR NEGATIVE 04/21/2018 2320   BILIRUBINUR NEGATIVE 04/21/2018 2320   KETONESUR NEGATIVE 04/21/2018 2320   PROTEINUR NEGATIVE 04/21/2018 2320   UROBILINOGEN 0.2 12/22/2013 1210   NITRITE NEGATIVE 04/21/2018 2320   LEUKOCYTESUR NEGATIVE 04/21/2018 2320    Radiological Exams on Admission: No results found.  Assessment/Plan Principal Problem:   Lower GI bleed Active Problems:   Diverticulosis of colon with hemorrhage   Diabetes mellitus type II, non insulin dependent (HCC)   HLD (hyperlipidemia)   Cough variant asthma   History of CVA (cerebrovascular accident)   Benign essential HTN   BPPV (benign paroxysmal positional vertigo)   Chronic combined systolic and diastolic CHF (congestive heart failure) (HCC)   Diverticulosis   GERD (gastroesophageal reflux disease)   AVM (arteriovenous malformation) of small bowel, acquired with hemorrhage  Lower GI bleed Has had multiple of these in the past.  Has had bleeding from both diverticular disease as well as an AV malformation.  Had a formal GI consult on his last admission which spontaneously resolved.  He had previously been on Plavix for a CVA but this was changed after multiple GI bleeds.  He was also changed from 325 mg of aspirin daily  to 81 mg after his last admission.  He reports continuing to take up to 2 aspirin daily because he is very concerned about having another stroke. Will get serial CBCs Gentle IV hydration PPI Transfusion if hemoglobin less than 8 or symptomatic Will hold his aspirin If continued bleeding may need tagged RBC scan per GI last admission.  Diabetes Continue metformin Sliding-scale insulin  Hyperlipidemia Continue Zocor  Cough variant asthma Continue albuterol, Pulmicort, Flonase, prednisone  History of CVA Holding his aspirin during this bleed  Hypertension BPs are soft and on no meds.  Will monitor  Combined systolic and diastolic CHF Gentle hydration as needed to support blood pressure  GERD Continue Protonix  DVT prophylaxis: SCD/Compression stockings due to active bleeding Code Status: Full code per patient he would want immediate resuscitation but no prolonged or protracted course on life support. Family Communication: Patient at bedside Disposition Plan: Home Consults called: None for now consider GI if bleeding is ongoing Admission status: Inpatient patient is inpatient due to need for close monitoring with GI bleed, hypotension need for IV hydration and support.   Donnamae Jude MD [facility]  If 7PM-7AM, please contact night-coverage 09/14/2020, 8:42 AM

## 2020-09-14 NOTE — ED Notes (Signed)
Urinal at bedside. RN asked patient to provide sample.  

## 2020-09-15 DIAGNOSIS — K922 Gastrointestinal hemorrhage, unspecified: Secondary | ICD-10-CM | POA: Diagnosis not present

## 2020-09-15 DIAGNOSIS — I10 Essential (primary) hypertension: Secondary | ICD-10-CM

## 2020-09-15 LAB — CBC
HCT: 29.2 % — ABNORMAL LOW (ref 39.0–52.0)
Hemoglobin: 9.2 g/dL — ABNORMAL LOW (ref 13.0–17.0)
MCH: 30.9 pg (ref 26.0–34.0)
MCHC: 31.5 g/dL (ref 30.0–36.0)
MCV: 98 fL (ref 80.0–100.0)
Platelets: 302 10*3/uL (ref 150–400)
RBC: 2.98 MIL/uL — ABNORMAL LOW (ref 4.22–5.81)
RDW: 13.6 % (ref 11.5–15.5)
WBC: 8.8 10*3/uL (ref 4.0–10.5)
nRBC: 0 % (ref 0.0–0.2)

## 2020-09-15 LAB — GLUCOSE, CAPILLARY
Glucose-Capillary: 99 mg/dL (ref 70–99)
Glucose-Capillary: 137 mg/dL — ABNORMAL HIGH (ref 70–99)

## 2020-09-15 NOTE — Consult Note (Addendum)
Referring Provider:  [facility]         Primary Care Physician:  Seward Carol, MD Primary Gastroenterologist:  Oretha Caprice, MD      Reason for Consultation:    GI bleed               ASSESSMENT / PLAN   # 87 yo male with recurrent painless hematochezia ( only on baby asa this time).  Numerous prior episodes for the same and felt to be diverticular in nature.  He has also been hospitalized before with occult GI bleed / anemia with findings of small bowel AVMs treated with APC. Sounds like this current bleed was diverticular but with syncopal episode per wife, a brisk upper bleed not excluded. The bleeding had stopped, no active bleeding since arriving to ED yesterday morning.  --Given cessation of bleeding a CTA / bleeding scan probably wouldn't be helpful.  --Hgb 9.2 down from 11.0 in ED yesterday and 13.5 on 08/20/20. From history patient gives it does sound like amount of blood loss was significant so today's hemoglobin is not surprising.  --Patient would like to avoid a colonoscopy. He is okay if upper endoscopic evaluation is needed,especially since he is having dysphagia , (see below). Will give full liquids today and discuss case with Dr. Lyndel Safe   # Dysphagia. Intermittent solid food dysphagia over last few months. Symptoms progressive over last several weeks. He gives a history of an esophageal dilation with Dr. Ardis Hughs. Non-obstructing Schatzki's ring on last upper endoscopy December 2021.  --Will decide on EGD tomorrow. If we move forward then most likely esophagus can be dilated at the time.   HISTORY OF PRESENT ILLNESS                                                                                                                         Chief Complaint:  rectal bleeding  [name] is a 87 y.o. male with a past medical history significant for hyperlipidemia , CVA maintained on aspirin only, , diverticulosis, small bowel AVMs, splenectomy.   Patient is very well known to  [place]. He has been hospitalized multiple times through the years for overt and occult gastrointestinal bleeding / iron deficiency anemia.  He has required blood transfusions on several occasions . He has had multiple upper and lower endoscopic evaluations and capsule studies.  Most of the time bleeding felt to be diverticular in nature though he has been found in 2021 VCE to have bleeding from small bowel AVM. Additionally, his last small bowel endoscopy in Dec 2021 showed  nonbleeding spots in the duodenum and jejunum which were treated with APC  We last saw patient during his most recent hospitalization for hematochezia on 08/18/20. CTAP negative.  Bleeding felt to be diverticular, colonoscopy not pursued.  Upon discharge his daily aspirin was changed from full dose to 81 mg daily.  At the time of discharge his hemoglobin was 13.5  Patient presented to the ED  yesterday with recurrent painless hematochezia. He describes getting up Sat around 3am and trying to make it to the bathroom to have a BM. On way to bathroom he passed stool mixed with dark red blood. Then he sat on the toilet for a while passing more dark red blood. At that point EMS was called. Wife [name] describes a syncopal episode during the bleed.  Patient says this is one of the largest volume bleeds that he has had.  In the ED his BP was slightly soft but recovered with fluids . Hemoglobin was 11, down from 13.5.  BUN normal.  Today his hemoglobin has declined further to 9.2. Based on [facility] it appears he only got a 500 ml bolus of LR yesterday.   Since arriving to hospital patient has not had any further bleeding. He passed a small amount of stool with a small amount of what appeared to be old blood this am.    PREVIOUS ENDOSCOPIC EVALUATIONS    Dec 2021 Most recent small bowel endoscopy for IDA, FOBT+ -Tortuous esophagus. - Non-obstructing Schatzki ring - improved compared to prior. - Hiatal hernia present. - Erythematous mucosa in the  stomach - previously biopsied. - Two spots with no bleeding in the duodenum. Treated with argon plasma coagulation (APC). - Jejunal spots with no bleeding were found. Treated with argon plasma coagulation (APC). - A previously placed tattoo was seen in the jejunum - we traversed this region for at least another 15 cm. - Otherwise, normal mucosa was found in the visualized jejunum. Tattooed distal extent.  Colonoscopy history: 2018 colonoscopy Dr. Carlean Purl confirm multiple left-sided diverticulosis but gets, prep in the right side was poor, no acute blood in the colon 2009 esophageal ring dilated June 2009 colonoscopy with significant left-sided diverticulosis 2003 colonoscopy with polyps, not adenomatous   Past Medical History:  Diagnosis Date   Acute CVA (cerebrovascular accident) ([place]) 07/25/2015   Asthma    Benign essential HTN    Cerebral infarction due to embolism of left middle cerebral artery ([place]) 02/03/2014   Chronic combined systolic and diastolic CHF (congestive heart failure) ([place]) 12/23/2017   Colitis    Colon polyps    Diabetes mellitus type 2, diet-controlled ([place]) 12/22/2013   Diverticulosis    GERD (gastroesophageal reflux disease)    GI bleed    GI bleed    Hemorrhoids    Hiatal hernia    History of esophageal strciture    HLD (hyperlipidemia)    Osteoarthritis    Proctitis    Status post dilation of esophageal narrowing    Stroke [facility])     Past Surgical History:  Procedure Laterality Date   ABDOMINAL HERNIA REPAIR     BIOPSY  04/05/2019   Procedure: BIOPSY;  Surgeon: Thornton Fawaz, MD;  Location: [place];  Service: Gastroenterology;;   COLONOSCOPY     multiple   COLONOSCOPY WITH PROPOFOL N/A 12/12/2016   Procedure: COLONOSCOPY WITH PROPOFOL;  Surgeon: Gatha Mayer, MD;  Location: [facility] ENDOSCOPY;  Service: Endoscopy;  Laterality: N/A;   COLONOSCOPY WITH PROPOFOL N/A 04/04/2019   Procedure: COLONOSCOPY WITH PROPOFOL;  Surgeon: Milus Banister, MD;   Location: [facility] ENDOSCOPY;  Service: Endoscopy;  Laterality: N/A;   ENTEROSCOPY N/A 01/16/2020   Procedure: ENTEROSCOPY;  Surgeon: Rush Landmark Telford Nab., MD;  Location: [facility] ENDOSCOPY;  Service: Gastroenterology;  Laterality: N/A;   ENTEROSCOPY N/A 01/20/2020   Procedure: ENTEROSCOPY;  Surgeon: Rush Landmark Telford Nab., MD;  Location: [facility] ENDOSCOPY;  Service: Gastroenterology;  Laterality: N/A;  ESOPHAGOGASTRODUODENOSCOPY     multiple   ESOPHAGOGASTRODUODENOSCOPY (EGD) WITH PROPOFOL N/A 04/05/2019   Procedure: ESOPHAGOGASTRODUODENOSCOPY (EGD) WITH PROPOFOL;  Surgeon: Thornton Lykins, MD;  Location: [place];  Service: Gastroenterology;  Laterality: N/A;   ESOPHAGOGASTRODUODENOSCOPY (EGD) WITH PROPOFOL N/A 01/18/2020   Procedure: ESOPHAGOGASTRODUODENOSCOPY (EGD) WITH PROPOFOL;  Surgeon: Doran Stabler, MD;  Location: [facility] ENDOSCOPY;  Service: Gastroenterology;  Laterality: N/A;  For placement of video capsule study   GIVENS CAPSULE STUDY N/A 01/16/2020   Procedure: [place];  Surgeon: Irving Copas., MD;  Location: [facility] ENDOSCOPY;  Service: Gastroenterology;  Laterality: N/A;   GIVENS CAPSULE STUDY N/A 01/18/2020   Procedure: GIVENS CAPSULE STUDY;  Surgeon: Doran Stabler, MD;  Location: [facility] ENDOSCOPY;  Service: Gastroenterology;  Laterality: N/A;   HOT HEMOSTASIS N/A 01/20/2020   Procedure: HOT HEMOSTASIS (ARGON PLASMA COAGULATION/BICAP);  Surgeon: Irving Copas., MD;  Location: [facility];  Service: Gastroenterology;  Laterality: N/A;   INSERTION OF MESH  01/29/2012   Procedure: INSERTION OF MESH;  Surgeon: Gwenyth Ober, MD;  Location: [place];  Service: General;  Laterality: N/A;   IR ANGIOGRAM SELECTIVE EACH ADDITIONAL VESSEL  04/01/2019   IR ANGIOGRAM VISCERAL SELECTIVE  04/01/2019   IR ANGIOGRAM VISCERAL SELECTIVE  04/01/2019   IR ANGIOGRAM VISCERAL SELECTIVE  04/01/2019   IR US GUIDE VASC ACCESS RIGHT  [id number]   NISSEN FUNDOPLICATION     SAVORY DILATION  N/A 01/16/2020   Procedure: SAVORY DILATION;  Surgeon: Irving Copas., MD;  Location: [facility] ENDOSCOPY;  Service: Gastroenterology;  Laterality: N/A;   SCLEROTHERAPY  01/20/2020   Procedure: SCLEROTHERAPY;  Surgeon: Mansouraty, Telford Nab., MD;  Location: [facility] ENDOSCOPY;  Service: Gastroenterology;;   SPLENECTOMY, TOTAL     nontraumatic rupture   SUBMUCOSAL TATTOO INJECTION  01/16/2020   Procedure: SUBMUCOSAL TATTOO INJECTION;  Surgeon: Irving Copas., MD;  Location: [facility] ENDOSCOPY;  Service: Gastroenterology;;   VENTRAL HERNIA REPAIR  01/29/2012    WITH MESH   VENTRAL HERNIA REPAIR  01/29/2012   Procedure: HERNIA REPAIR VENTRAL ADULT;  Surgeon: Gwenyth Ober, MD;  Location: [place];  Service: General;  Laterality: N/A;  open recurrent ventral hernia repair with mesh    Prior to Admission medications   Medication Sig Start Date End Date Taking? Authorizing Provider  albuterol (VENTOLIN HFA) 108 (90 Base) MCG/ACT inhaler Inhale 2 puffs into the lungs every 6 (six) hours as needed for wheezing or shortness of breath.   Yes [provider]  aspirin EC 81 MG tablet Take 1 tablet (81 mg total) by mouth daily. Swallow whole. 08/20/20 08/20/21 Yes Rai, Ripudeep K, MD  budesonide (PULMICORT) 0.5 MG/2ML nebulizer solution Take 2 mLs by nebulization See admin instructions. 61m by nebulization daily And 272mby nebulization daily as needed for shortness of breath/wheezing 09/01/18  Yes [provider]  Cyanocobalamin (VITAMIN B-12 PO) Take 2 tablets by mouth daily.   Yes [provider]  diphenhydramine-acetaminophen (TYLENOL PM) 25-500 MG TABS tablet Take 2 tablets by mouth at bedtime.   Yes [provider]  fluticasone (FLONASE) 50 MCG/ACT nasal spray Place 1 spray into both nostrils daily as needed for allergies or rhinitis.  06/18/14  Yes [provider]  Melatonin 5 MG CAPS Take 5 mg by mouth at bedtime.   Yes [provider]  metFORMIN  (GLUCOPHAGE) 500 MG tablet Take 500 mg by mouth daily. 08/23/19  Yes [provider]  Multiple Vitamins-Minerals (ZINC PO) Take 1 tablet  by mouth daily.   Yes [provider]  pantoprazole (PROTONIX) 40 MG tablet Take 1 tablet (40 mg total) by mouth daily. 04/22/20  Yes Milus Banister, MD  polyvinyl alcohol (LIQUIFILM TEARS) 1.4 % ophthalmic solution Place 1 drop into both eyes as needed for dry eyes.   Yes [provider]  predniSONE (DELTASONE) 10 MG tablet Take 5 mg by mouth daily.  07/11/19  Yes [provider]  rOPINIRole (REQUIP) 3 MG tablet Take 1 tablet (3 mg total) by mouth at bedtime. 10/26/19  Yes McCue, Janett Billow, NP  simvastatin (ZOCOR) 40 MG tablet Take 1 tablet (40 mg total) by mouth daily. 02/26/16  Yes Amin, Jeanella Flattery, MD  traMADol (ULTRAM) 50 MG tablet Take 1 tablet (50 mg total) by mouth daily as needed for moderate pain. Patient taking differently: Take 50 mg by mouth See admin instructions. '50mg'$  by mouth daily And '50mg'$  by mouth daily as needed for pain 01/22/20  Yes Lavina Hamman, MD  vitamin C (ASCORBIC ACID) 500 MG tablet Take 500 mg by mouth daily.   Yes [provider]  simethicone (MYLICON) 80 MG chewable tablet Chew 1 tablet (80 mg total) by mouth 4 (four) times daily as needed for flatulence. Also available over-the-counter Patient not taking: Reported on 09/14/2020 08/20/20   Mendel Corning, MD    Current Facility-Administered Medications  Medication Dose Route Frequency Provider Last Rate Last Admin   acetaminophen (TYLENOL) tablet 650 mg  650 mg Oral Q6H PRN Donnamae Jude, MD   650 mg at 09/14/20 2140   Or   acetaminophen (TYLENOL) suppository 650 mg  650 mg Rectal Q6H PRN Donnamae Jude, MD       albuterol (PROVENTIL) (2.5 MG/3ML) 0.083% nebulizer solution 3 mL  3 mL Inhalation Q6H PRN Donnamae Jude, MD       budesonide (PULMICORT) nebulizer solution 0.5 mg  2 mL Nebulization BID Donnamae Jude, MD   0.5 mg at 09/15/20 0751    fluticasone (FLONASE) 50 MCG/ACT nasal spray 1 spray  1 spray Each Nare Daily PRN Donnamae Jude, MD       insulin aspart (novoLOG) injection 0-9 Units  0-9 Units Subcutaneous TID WC Donnamae Jude, MD       lactated ringers infusion   Intravenous Continuous Donnamae Jude, MD       metFORMIN (GLUCOPHAGE) tablet 500 mg  500 mg Oral Q breakfast Donnamae Jude, MD   500 mg at 09/15/20 0900   metoprolol tartrate (LOPRESSOR) injection 5 mg  5 mg Intravenous Q6H PRN Donnamae Jude, MD       morphine 2 MG/ML injection 2 mg  2 mg Intravenous Q2H PRN Donnamae Jude, MD       pantoprazole (PROTONIX) EC tablet 40 mg  40 mg Oral Daily Donnamae Jude, MD   40 mg at 09/15/20 1011   predniSONE (DELTASONE) tablet 5 mg  5 mg Oral Daily Donnamae Jude, MD   5 mg at 09/15/20 1010   rOPINIRole (REQUIP) tablet 3 mg  3 mg Oral QHS Donnamae Jude, MD   3 mg at 09/14/20 2140   simvastatin (ZOCOR) tablet 40 mg  40 mg Oral Daily Donnamae Jude, MD   40 mg at 09/15/20 1011   traMADol (ULTRAM) tablet 50 mg  50 mg Oral Daily PRN Donnamae Jude, MD       traZODone (DESYREL) tablet 25 mg  25 mg  Oral QHS PRN Donnamae Jude, MD   25 mg at 09/14/20 2140    Allergies as of 09/14/2020 - Review Complete 09/14/2020  Allergen Reaction Noted   Tape Other (See Comments) 02/04/2018    Family History  Problem Relation Age of Onset   Alzheimer's disease Mother    Breast cancer Mother    Throat cancer Father    Colon cancer Neg Hx    Colon polyps Neg Hx    Pancreatic cancer Neg Hx    Stomach cancer Neg Hx    Liver disease Neg Hx     Social History   Socioeconomic History   Marital status: Married    Spouse name: [name]   Number of children: 2   Years of education: Bachelor's   Highest education level: Not on file  Occupational History   Occupation: retired  Tobacco Use   Smoking status: Never   Smokeless tobacco: Never  Substance and Sexual Activity   Alcohol use: Yes    Alcohol/week: 2.0 standard drinks     Types: 2 Glasses of wine per week    Comment: daily   Drug use: No   Sexual activity: Not on file  Other Topics Concern   Not on file  Social History Narrative   Patient is married and has 2 children.   Patient is right handed.   Patient has Bachelor's degree.   Patient drinks 2 cups daily.   Social Determinants of Health   Financial Resource Strain: Not on file  Food Insecurity: Not on file  Transportation Needs: Not on file  Physical Activity: Not on file  Stress: Not on file  Social Connections: Not on file  Intimate Partner Violence: Not on file    Review of Systems: All systems reviewed and negative except where noted in HPI.  OBJECTIVE    Physical Exam: Vital signs in last 24 hours: Temp:  [97.5 F (36.4 C)-98 F (36.7 C)] 98 F (36.7 C) (07/31 1330) Pulse Rate:  [42-81] 43 (07/31 1330) Resp:  [12-18] 12 (07/31 1330) BP: (124-144)/(59-72) 124/59 (07/31 1330) SpO2:  [96 %-100 %] 99 % (07/31 1330) Last BM Date: 09/14/20 General:   Alert  male in NAD Psych:  Pleasant, cooperative. Normal mood and affect. Eyes:  Pupils equal, sclera clear, no icterus.   Conjunctiva pink. Ears:  Normal auditory acuity. Nose:  No deformity, discharge,  or lesions. Neck:  Supple; no masses Lungs:  Clear throughout to auscultation.   No wheezes, crackles, or rhonchi.  Heart:  Regular rate ;  no lower extremity edema Abdomen:  Soft, non-distended, nontender, BS active, no palp mass   Rectal:  Deferred  Msk:  Symmetrical without gross deformities. . Neurologic:  Alert and  oriented x4;  grossly normal neurologically. Skin:  Intact without significant lesions or rashes.   Scheduled inpatient medications  budesonide  2 mL Nebulization BID   insulin aspart  0-9 Units Subcutaneous TID WC   metFORMIN  500 mg Oral Q breakfast   pantoprazole  40 mg Oral Daily   predniSONE  5 mg Oral Daily   rOPINIRole  3 mg Oral QHS   simvastatin  40 mg Oral Daily      Intake/Output from  previous day: 07/30 0701 - 07/31 0700 In: 1280 [P.O.:480; I.V.:800] Out: 1000 [Urine:1000] Intake/Output this shift: Total I/O In: 240 [P.O.:240] Out: 300 [Urine:300]   Lab Results: Recent Labs    09/14/20 0540 09/15/20 0518  WBC 13.2* 8.8  HGB 11.0* 9.2*  HCT 35.4* 29.2*  PLT 325 302   BMET Recent Labs    09/14/20 0540  NA 139  K 3.9  CL 109  CO2 24  GLUCOSE 204*  BUN 16  CREATININE 0.86  CALCIUM 7.8*   LFT Recent Labs    09/14/20 0540  PROT 5.2*  ALBUMIN 3.1*  AST 14*  ALT 10  ALKPHOS 61  BILITOT 0.5   PT/INR No results for input(s): LABPROT, INR in the last 72 hours. Hepatitis Panel No results for input(s): HEPBSAG, HCVAB, HEPAIGM, HEPBIGM in the last 72 hours.   . CBC Latest Ref Rng & Units 09/15/2020 09/14/2020 08/20/2020  WBC 4.0 - 10.5 K/uL 8.8 13.2(H) 9.1  Hemoglobin 13.0 - 17.0 g/dL 9.2(L) 11.0(L) 13.5  Hematocrit 39.0 - 52.0 % 29.2(L) 35.4(L) 41.5  Platelets 150 - 400 K/uL 302 325 299    . CMP Latest Ref Rng & Units 09/14/2020 08/19/2020 08/18/2020  Glucose 70 - 99 mg/dL 204(H) 111(H) 147(H)  BUN 8 - 23 mg/dL '16 20 12  '$ Creatinine 0.61 - 1.24 mg/dL 0.86 0.75 0.64  Sodium 135 - 145 mmol/L 139 138 139  Potassium 3.5 - 5.1 mmol/L 3.9 3.9 3.9  Chloride 98 - 111 mmol/L 109 106 105  CO2 22 - 32 mmol/L '24 25 29  '$ Calcium 8.9 - 10.3 mg/dL 7.8(L) 8.4(L) 8.9  Total Protein 6.5 - 8.1 g/dL 5.2(L) - 6.9  Total Bilirubin 0.3 - 1.2 mg/dL 0.5 - 0.7  Alkaline Phos 38 - 126 U/L 61 - 77  AST 15 - 41 U/L 14(L) - 20  ALT 0 - 44 U/L 10 - 15   Studies/Results: No results found.  Principal Problem:   Lower GI bleed Active Problems:   Diverticulosis of colon with hemorrhage   Diabetes mellitus type II, non insulin dependent (HCC)   HLD (hyperlipidemia)   Cough variant asthma   History of CVA (cerebrovascular accident)   Benign essential HTN   BPPV (benign paroxysmal positional vertigo)   Chronic combined systolic and diastolic CHF (congestive heart  failure) (HCC)   Diverticulosis   GERD (gastroesophageal reflux disease)   AVM (arteriovenous malformation) of small bowel, acquired with hemorrhage    Tye Savoy, NP-C @  09/15/2020, 1:39 PM   Attending physician's note   I have taken an interval history, reviewed the chart and examined the patient. I agree with the Advanced Practitioner's note, impression and recommendations.   Extensive GI history as above.  This episode-he had more melanotic (darker) stools with drop in Hb from 13.5 (7/5) to 9.2 with syncopal episode. R/O brisk UGI bleeding.  Also having eso dysphagia with H/O Schatzki's ring s/p dil Dec 2021.  Certainly can represent diverticular bleed.  Willing to proceed with Push Enteroscopy tomorrow with Dr. Fuller Plan.  Note that he also has a H/O SB AVMs.  Would also need esophageal dilatation.  Trend CBC. Continue Protonix.  He wants to hold off on colonoscopy at this time.  Also hold off on CTA/RBC scan since bleeding is stopped.  Dr. Randell Loop. Hilarie Fredrickson taking over the service tomorrow    Carmell Austria, MD [facility] [phone]

## 2020-09-15 NOTE — Progress Notes (Signed)
PROGRESS NOTE    [name]  [id number] DOB: May 31, 1931 DOA: 09/14/2020 PCP: Seward Carol, MD    Brief Narrative:  87 y.o. male with medical history significant of type 2 diabetes, GERD, hyperlipidemia, diastolic CHF, prior CVA with multiple prior GI bleeds, last admitted in early July for similar presentation.  He reports awakening this morning at 4 AM and having frank rectal bleeding.  He thinks he lost a lot of blood while he was sitting on the toilet for a while.  He had a second episode while at home.  He came to the ED for evaluation. Pt was admitted for further work up for gi bleed  Assessment & Plan:   Principal Problem:   Lower GI bleed Active Problems:   Diverticulosis of colon with hemorrhage   Diabetes mellitus type II, non insulin dependent (HCC)   HLD (hyperlipidemia)   Cough variant asthma   History of CVA (cerebrovascular accident)   Benign essential HTN   BPPV (benign paroxysmal positional vertigo)   Chronic combined systolic and diastolic CHF (congestive heart failure) (HCC)   Diverticulosis   GERD (gastroesophageal reflux disease)   AVM (arteriovenous malformation) of small bowel, acquired with hemorrhage  Lower GI bleed with acute blood loss anemia present on admit Has had multiple of these in the past.  Has had bleeding from both diverticular disease as well as an AV malformation.  Had a formal GI consult on his last admission which spontaneously resolved.   -Noted to be taking ASA prior to admit, currently on hold -Hgb down to 9.2 from 11 at time of presentation -Discussed with GI who has since ordered CTA. Appreciate input by GI -Recheck CBC in AM  Diabetes Continue metformin Continue with SSI coverage  Hyperlipidemia Continue Zocor  Cough variant asthma Continue albuterol, Pulmicort, Flonase, prednisone -On minimal O2 support at this time  History of CVA Holding his aspirin during this bleed -Seems stable this AM  Hypertension BPs are  soft and on no meds.  Will monitor  Combined systolic and diastolic CHF Gentle hydration as needed to support blood pressure Appeared euvolemic on exam this AM  GERD Continue Protonix    DVT prophylaxis: SCD's Code Status: Full Family Communication: Pt in room, family at bedside  Status is: Inpatient  Remains inpatient appropriate because:Inpatient level of care appropriate due to severity of illness  Dispo: The patient is from: Home              Anticipated d/c is to: Home              Patient currently is not medically stable to d/c.   Difficult to place patient No       Consultants:  GI  Procedures:    Antimicrobials: Anti-infectives (From admission, onward)    None       Subjective: Reports no further bleeding this AM. Had dark stool this AM. Denies abd pain  Objective: Vitals:   09/14/20 2119 09/15/20 0615 09/15/20 0751 09/15/20 1330  BP:  (!) 134/59  (!) 124/59  Pulse: 81 (!) 42  (!) 58  Resp:  18  12  Temp:  97.9 F (36.6 C)  98 F (36.7 C)  TempSrc:  Oral  Oral  SpO2: 96% 98% 97% 99%  Weight:      Height:        Intake/Output Summary (Last 24 hours) at 09/15/2020 1548 Last data filed at 09/15/2020 1426 Gross per 24 hour  Intake 1090 ml  Output 1300 ml  Net -210 ml   Filed Weights   09/14/20 1100  Weight: 70.3 kg    Examination: General exam: Awake, laying in bed, in nad Respiratory system: Normal respiratory effort, no wheezing Cardiovascular system: regular rate, s1, s2 Gastrointestinal system: Soft, nondistended, positive BS Central nervous system: CN2-12 grossly intact, strength intact Extremities: Perfused, no clubbing Skin: Normal skin turgor, no notable skin lesions seen Psychiatry: Mood normal // no visual hallucinations   Data Reviewed: I have personally reviewed following labs and imaging studies  CBC: Recent Labs  Lab 09/14/20 0540 09/15/20 0518  WBC 13.2* 8.8  NEUTROABS 8.8*  --   HGB 11.0* 9.2*  HCT 35.4*  29.2*  MCV 99.4 98.0  PLT 325 [id number]   Basic Metabolic Panel: Recent Labs  Lab 09/14/20 0540  NA 139  K 3.9  CL 109  CO2 24  GLUCOSE 204*  BUN 16  CREATININE 0.86  CALCIUM 7.8*   GFR: Estimated Creatinine Clearance: 60.2 mL/min (by C-G formula based on SCr of 0.86 mg/dL). Liver Function Tests: Recent Labs  Lab 09/14/20 0540  AST 14*  ALT 10  ALKPHOS 61  BILITOT 0.5  PROT 5.2*  ALBUMIN 3.1*   Recent Labs  Lab 09/14/20 0540  LIPASE 21   No results for input(s): AMMONIA in the last 168 hours. Coagulation Profile: No results for input(s): INR, PROTIME in the last 168 hours. Cardiac Enzymes: No results for input(s): CKTOTAL, CKMB, CKMBINDEX, TROPONINI in the last 168 hours. BNP (last 3 results) No results for input(s): PROBNP in the last 8760 hours. HbA1C: No results for input(s): HGBA1C in the last 72 hours. CBG: Recent Labs  Lab 09/14/20 1144 09/14/20 1542 09/14/20 2218 09/15/20 0743 09/15/20 1456  GLUCAP 105* 104* 125* 99 137*   Lipid Profile: No results for input(s): CHOL, HDL, LDLCALC, TRIG, CHOLHDL, LDLDIRECT in the last 72 hours. Thyroid Function Tests: No results for input(s): TSH, T4TOTAL, FREET4, T3FREE, THYROIDAB in the last 72 hours. Anemia Panel: No results for input(s): VITAMINB12, FOLATE, FERRITIN, TIBC, IRON, RETICCTPCT in the last 72 hours. Sepsis Labs: No results for input(s): PROCALCITON, LATICACIDVEN in the last 168 hours.  Recent Results (from the past 240 hour(s))  Resp Panel by RT-PCR (Flu A&B, Covid) Nasopharyngeal Swab     Status: None   Collection Time: 09/14/20  5:49 AM   Specimen: Nasopharyngeal Swab; Nasopharyngeal(NP) swabs in vial transport medium  Result Value Ref Range Status   SARS Coronavirus 2 by RT PCR NEGATIVE NEGATIVE Final    Comment: (NOTE) SARS-CoV-2 target nucleic acids are NOT DETECTED.  The SARS-CoV-2 RNA is generally detectable in upper respiratory specimens during the acute phase of infection. The  lowest concentration of SARS-CoV-2 viral copies this assay can detect is 138 copies/mL. A negative result does not preclude SARS-Cov-2 infection and should not be used as the sole basis for treatment or other patient management decisions. A negative result may occur with  improper specimen collection/handling, submission of specimen other than nasopharyngeal swab, presence of viral mutation(s) within the areas targeted by this assay, and inadequate number of viral copies(<138 copies/mL). A negative result must be combined with clinical observations, patient history, and epidemiological information. The expected result is Negative.  Fact Sheet for Patients:  EntrepreneurPulse.com.au  Fact Sheet for Healthcare Providers:  IncredibleEmployment.be  This test is no t yet approved or cleared by the [place] FDA and  has been authorized for detection and/or diagnosis of SARS-CoV-2 by FDA under an Emergency Use  Authorization (EUA). This EUA will remain  in effect (meaning this test can be used) for the duration of the COVID-19 declaration under Section 564(b)(1) of the Act, 21 U.S.C.section 360bbb-3(b)(1), unless the authorization is terminated  or revoked sooner.       Influenza A by PCR NEGATIVE NEGATIVE Final   Influenza B by PCR NEGATIVE NEGATIVE Final    Comment: (NOTE) The Xpert Xpress SARS-CoV-2/FLU/RSV plus assay is intended as an aid in the diagnosis of influenza from Nasopharyngeal swab specimens and should not be used as a sole basis for treatment. Nasal washings and aspirates are unacceptable for Xpert Xpress SARS-CoV-2/FLU/RSV testing.  Fact Sheet for Patients: EntrepreneurPulse.com.au  Fact Sheet for Healthcare Providers: IncredibleEmployment.be  This test is not yet approved or cleared by the [place] FDA and has been authorized for detection and/or diagnosis of SARS-CoV-2 by FDA under  an Emergency Use Authorization (EUA). This EUA will remain in effect (meaning this test can be used) for the duration of the COVID-19 declaration under Section 564(b)(1) of the Act, 21 U.S.C. section 360bbb-3(b)(1), unless the authorization is terminated or revoked.  Performed at [facility], [place] [address], [place], [place] [address]      Radiology Studies: No results found.  Scheduled Meds:  budesonide  2 mL Nebulization BID   insulin aspart  0-9 Units Subcutaneous TID WC   metFORMIN  500 mg Oral Q breakfast   pantoprazole  40 mg Oral Daily   predniSONE  5 mg Oral Daily   rOPINIRole  3 mg Oral QHS   simvastatin  40 mg Oral Daily   Continuous Infusions:  lactated ringers       LOS: 1 day   Marylu Lund, MD [facility] Pager On Amion  If 7PM-7AM, please contact night-coverage 09/15/2020, 3:48 PM

## 2020-09-16 ENCOUNTER — Encounter (HOSPITAL_COMMUNITY)
Admission: EM | Disposition: A | Discharge status: Home / Self Care | Payer: Self-pay | Source: Home / Self Care | Attending: Internal Medicine

## 2020-09-16 ENCOUNTER — Encounter (HOSPITAL_COMMUNITY): Payer: Self-pay | Admitting: Family Medicine

## 2020-09-16 ENCOUNTER — Inpatient Hospital Stay (HOSPITAL_COMMUNITY): Payer: Medicare Other | Admitting: Certified Registered"

## 2020-09-16 DIAGNOSIS — I10 Essential (primary) hypertension: Secondary | ICD-10-CM | POA: Diagnosis not present

## 2020-09-16 DIAGNOSIS — K922 Gastrointestinal hemorrhage, unspecified: Secondary | ICD-10-CM | POA: Diagnosis not present

## 2020-09-16 DIAGNOSIS — K921 Melena: Secondary | ICD-10-CM | POA: Diagnosis not present

## 2020-09-16 DIAGNOSIS — K31819 Angiodysplasia of stomach and duodenum without bleeding: Secondary | ICD-10-CM

## 2020-09-16 DIAGNOSIS — D62 Acute posthemorrhagic anemia: Secondary | ICD-10-CM

## 2020-09-16 DIAGNOSIS — R1310 Dysphagia, unspecified: Secondary | ICD-10-CM

## 2020-09-16 DIAGNOSIS — I5042 Chronic combined systolic (congestive) and diastolic (congestive) heart failure: Secondary | ICD-10-CM | POA: Diagnosis not present

## 2020-09-16 HISTORY — PX: SAVORY DILATION: SHX5439

## 2020-09-16 HISTORY — PX: ENTEROSCOPY: SHX5533

## 2020-09-16 HISTORY — PX: HOT HEMOSTASIS: SHX5433

## 2020-09-16 LAB — BASIC METABOLIC PANEL
Anion gap: 6 (ref 5–15)
BUN: 15 mg/dL (ref 8–23)
CO2: 26 mmol/L (ref 22–32)
Calcium: 8.5 mg/dL — ABNORMAL LOW (ref 8.9–10.3)
Chloride: 106 mmol/L (ref 98–111)
Creatinine, Ser: 0.66 mg/dL (ref 0.61–1.24)
GFR, Estimated: >60 mL/min (ref >60)
Glucose, Bld: 96 mg/dL (ref 70–99)
Potassium: 3.6 mmol/L (ref 3.5–5.1)
Sodium: 138 mmol/L (ref 135–145)

## 2020-09-16 LAB — CBC
HCT: 29.5 % — ABNORMAL LOW (ref 39.0–52.0)
Hemoglobin: 9.4 g/dL — ABNORMAL LOW (ref 13.0–17.0)
MCH: 31.2 pg (ref 26.0–34.0)
MCHC: 31.9 g/dL (ref 30.0–36.0)
MCV: 98 fL (ref 80.0–100.0)
Platelets: 308 10*3/uL (ref 150–400)
RBC: 3.01 MIL/uL — ABNORMAL LOW (ref 4.22–5.81)
RDW: 13.5 % (ref 11.5–15.5)
WBC: 9.2 10*3/uL (ref 4.0–10.5)
nRBC: 0 % (ref 0.0–0.2)

## 2020-09-16 LAB — GLUCOSE, CAPILLARY
Glucose-Capillary: 101 mg/dL — ABNORMAL HIGH (ref 70–99)
Glucose-Capillary: 111 mg/dL — ABNORMAL HIGH (ref 70–99)
Glucose-Capillary: 172 mg/dL — ABNORMAL HIGH (ref 70–99)

## 2020-09-16 SURGERY — ENTEROSCOPY
Anesthesia: Monitor Anesthesia Care

## 2020-09-16 MED ORDER — LIDOCAINE 2% (20 MG/ML) 5 ML SYRINGE
INTRAMUSCULAR | Status: DC | PRN
  Administered 2020-09-16: 40 mg via INTRAVENOUS

## 2020-09-16 MED ORDER — PHENYLEPHRINE HCL (PRESSORS) 10 MG/ML IV SOLN
INTRAVENOUS | Status: AC
  Filled 2020-09-16: qty 1

## 2020-09-16 MED ORDER — PROPOFOL 10 MG/ML IV BOLUS
INTRAVENOUS | Status: AC
  Filled 2020-09-16: qty 20

## 2020-09-16 MED ORDER — LACTATED RINGERS IV SOLN: INTRAVENOUS | Status: DC

## 2020-09-16 MED ORDER — PROPOFOL 10 MG/ML IV BOLUS
INTRAVENOUS | Status: DC | PRN
  Administered 2020-09-16 (×3): 10 mg via INTRAVENOUS

## 2020-09-16 MED ORDER — PROPOFOL 500 MG/50ML IV EMUL
INTRAVENOUS | Status: DC | PRN
  Administered 2020-09-16: 120 ug/kg/min via INTRAVENOUS

## 2020-09-16 NOTE — Anesthesia Procedure Notes (Signed)
Procedure Name: MAC Date/Time: 09/16/2020 2:04 PM Performed by: Eben Burow, CRNA Pre-anesthesia Checklist: Patient identified, Emergency Drugs available, Suction available, Patient being monitored and Timeout performed Oxygen Delivery Method: Simple face mask

## 2020-09-16 NOTE — Transfer of Care (Signed)
Immediate Anesthesia Transfer of Care Note  Patient: [name]  Procedure(s) Performed: ENTEROSCOPY HOT HEMOSTASIS (ARGON PLASMA COAGULATION/BICAP) SAVORY DILATION  Patient Location: PACU and Endoscopy Unit  Anesthesia Type:MAC  Level of Consciousness: drowsy and patient cooperative  Airway & Oxygen Therapy: Patient Spontanous Breathing and Patient connected to face mask oxygen  Post-op Assessment: Report given to RN and Post -op Vital signs reviewed and stable  Post vital signs: Reviewed and stable  Last Vitals:  Vitals Value Taken Time  BP 120/63 09/16/20 1443  Temp    Pulse 61 09/16/20 1443  Resp 17 09/16/20 1444  SpO2 100 % 09/16/20 1443  Vitals shown include unvalidated device data.  Last Pain:  Vitals:   09/16/20 1232  TempSrc: Oral  PainSc: 0-No pain      Patients Stated Pain Goal: 0 (37/44/51 4604)  Complications: No notable events documented.

## 2020-09-16 NOTE — Interval H&P Note (Signed)
History and Physical Interval Note:  09/16/2020 1:23 PM  [name]  has presented today for surgery, with the diagnosis of gastrointestinal bleeding, anemia and dysphagia.  The various methods of treatment have been discussed with the patient and family. After consideration of risks, benefits and other options for treatment, the patient has consented to  Procedure(s) with comments: ENTEROSCOPY (N/A) - push enteroscopy as a surgical intervention.  The patient's history has been reviewed, patient examined, no change in status, stable for surgery.  I have reviewed the patient's chart and labs.  Questions were answered to the patient's satisfaction.     Pricilla Riffle. Fuller Plan

## 2020-09-16 NOTE — Op Note (Signed)
[facility] Patient Name: [name] Procedure Date: 09/16/2020 MRN: [id number] Attending MD: Ladene Artist , MD Date of Birth: Mar 27, 1931 CSN: [id number] Age: 87 Admit Type: Inpatient Procedure:                Small bowel enteroscopy Indications:              Hematochezia Providers:                Pricilla Riffle. Fuller Plan, MD, Kary Kos RN, RN, Cherylynn Ridges, Technician, Jefm Miles CRNA Referring MD:             [facility] Medicines:                Monitored Anesthesia Care Complications:            No immediate complications. Estimated Blood Loss:     Estimated blood loss: none. Procedure:                Pre-Anesthesia Assessment:                           - Prior to the procedure, a History and Physical                            was performed, and patient medications and                            allergies were reviewed. The patient's tolerance of                            previous anesthesia was also reviewed. The risks                            and benefits of the procedure and the sedation                            options and risks were discussed with the patient.                            All questions were answered, and informed consent                            was obtained. Prior Anticoagulants: The patient has                            taken no previous anticoagulant or antiplatelet                            agents. ASA Grade Assessment: II - A patient with                            mild systemic disease. After reviewing the risks  and benefits, the patient was deemed in                            satisfactory condition to undergo the procedure.                           After obtaining informed consent, the endoscope was                            passed under direct vision. Throughout the                            procedure, the patient's blood pressure, pulse, and                            oxygen  saturations were monitored continuously. The                            [id number] Olympus slim colonoscope was                            introduced through the mouth and advanced to the                            proximal jejunum. The [id number] Olympus                            endoscope was introduced through the and advanced                            to the stomach. The small bowel enteroscopy was                            accomplished without difficulty. The patient                            tolerated the procedure well. Scope In: Scope Out: Findings:      The mid esophagus and distal esophagus were mildly tortuous.      One benign-appearing, intrinsic moderate stenosis was found at the       gastroesophageal junction. This stenosis measured 1.1 cm (inner       diameter) x less than one cm (in length). The stenosis was traversed. A       guidewire was placed and the scope was withdrawn. Dilations were       performed with Savary dilators with mild resistance at 12.8 mm, 14 mm,       15 mm and 16 mm. No heme noted.      The exam of the esophagus was otherwise normal.      A medium-sized hiatal hernia was present.      The exam of the stomach was otherwise normal.      A single 3 mm angiodysplastic lesion with no bleeding was found at the       incisura. Fulguration to ablate the lesion to prevent bleeding by argon       plasma  was successful.      There was no evidence of significant pathology in the entire examined       duodenum.      Two tattoos were seen in the proximal jejunum. The exam extended 15 cm       beyond the more distal tattoo. The jejunum with otherwise normal. Impression:               - Tortuous esophagus.                           - Benign-appearing esophageal stenosis. Dilated.                           - Medium-sized hiatal hernia.                           - A single non-bleeding angiodysplastic lesion in                            the  stomach. Treated with argon plasma coagulation                            (APC).                           - Normal examined duodenum.                           - Two tattoos were seen in the proximal jejunum.                           - The proximal jejunum otherwise appeared normal.                           - No specimens collected. Recommendation:           - Return patient to hospital ward for ongoing care.                           - Suspected diverticular bleed, resolving.                           - Consider discharge home later today or tomorrow.                           - Clear liquid diet for 2 hours then advance as                            tolerated.                           - Continue pantoprazole 40 mg po qd long term.                           - Follow antireflux measures.                           -  Return to GI office in 6 weeks with Dr. Ardis Hughs. Procedure Code(s):        --- Professional ---                           [phone], Small intestinal endoscopy, enteroscopy                            beyond second portion of duodenum, not including                            ileum; with control of bleeding (eg, injection,                            bipolar cautery, unipolar cautery, laser, heater                            probe, stapler, plasma coagulator)                           43248, 30, Esophagogastroduodenoscopy, flexible,                            transoral; with insertion of guide wire followed by                            passage of dilator(s) through esophagus over guide                            wire Diagnosis Code(s):        --- Professional ---                           Q39.9, Congenital malformation of esophagus,                            unspecified                           K22.2, Esophageal obstruction                           K44.9, Diaphragmatic hernia without obstruction or                            gangrene                           K31.819,  Angiodysplasia of stomach and duodenum                            without bleeding                           K92.1, Melena (includes Hematochezia) CPT copyright 2019 American Medical Association. All rights reserved. The codes documented in this report are preliminary and upon coder review may  be revised to meet current compliance requirements. Ladene Artist, MD 09/16/2020 2:59:33  PM This report has been signed electronically. Number of Addenda: 0

## 2020-09-16 NOTE — Anesthesia Preprocedure Evaluation (Signed)
Anesthesia Evaluation  Patient identified by MRN, date of birth, ID band Patient awake    Reviewed: Allergy & Precautions, NPO status , Patient's Chart, lab work & pertinent test results  History of Anesthesia Complications Negative for: history of anesthetic complications  Airway Mallampati: II  TM Distance: >3 FB Neck ROM: Full    Dental  (+) Lower Dentures, Missing,    Pulmonary asthma ,    breath sounds clear to auscultation       Cardiovascular hypertension, Pt. on medications +CHF   Rhythm:Regular Rate:Normal  2018 ECHO: Left ventricle: The cavity size was normal. Wall thickness was  normal. Systolic function was mildly to moderately reduced. The  estimated ejection fraction was 45%. Doppler parameters are  consistent with abnormal left ventricular relaxation (grade 1  diastolic dysfunction).   Neuro/Psych Seizures -,  TIA Neuromuscular disease CVA negative psych ROS   GI/Hepatic Neg liver ROS, hiatal hernia, GERD  ,  Endo/Other  diabetes  Renal/GU negative Renal ROS     Musculoskeletal  (+) Arthritis ,   Abdominal   Peds  Hematology  (+) Blood dyscrasia, anemia , Lab Results      Component                Value               Date                      WBC                      9.2                 09/16/2020                HGB                      9.4 (L)             09/16/2020                HCT                      29.5 (L)            09/16/2020                MCV                      98.0                09/16/2020                PLT                      308                 09/16/2020              Anesthesia Other Findings   Reproductive/Obstetrics                             Anesthesia Physical Anesthesia Plan  ASA: 3  Anesthesia Plan: MAC   Post-op Pain Management:    Induction: Intravenous  PONV Risk Score and Plan: 1 and Propofol infusion and Treatment may  vary due to age or medical condition  Airway Management Planned: Nasal  Cannula  Additional Equipment: None  Intra-op Plan:   Post-operative Plan:   Informed Consent: I have reviewed the patients History and Physical, chart, labs and discussed the procedure including the risks, benefits and alternatives for the proposed anesthesia with the patient or authorized representative who has indicated his/her understanding and acceptance.     Dental advisory given  Plan Discussed with: CRNA and Anesthesiologist  Anesthesia Plan Comments:         Anesthesia Quick Evaluation

## 2020-09-16 NOTE — Progress Notes (Signed)
PROGRESS NOTE    [name]  [id number] DOB: 08/18/1931 DOA: 09/14/2020 PCP: Seward Carol, MD    Brief Narrative:  87 y.o. male with medical history significant of type 2 diabetes, GERD, hyperlipidemia, diastolic CHF, prior CVA with multiple prior GI bleeds, last admitted in early July for similar presentation.  He reports awakening this morning at 4 AM and having frank rectal bleeding.  He thinks he lost a lot of blood while he was sitting on the toilet for a while.  He had a second episode while at home.  He came to the ED for evaluation. Pt was admitted for further work up for gi bleed  Assessment & Plan:   Principal Problem:   Lower GI bleed Active Problems:   Diverticulosis of colon with hemorrhage   Diabetes mellitus type II, non insulin dependent (HCC)   HLD (hyperlipidemia)   Cough variant asthma   History of CVA (cerebrovascular accident)   Benign essential HTN   BPPV (benign paroxysmal positional vertigo)   Chronic combined systolic and diastolic CHF (congestive heart failure) (HCC)   Diverticulosis   GERD (gastroesophageal reflux disease)   AVM (arteriovenous malformation) of small bowel, acquired with hemorrhage   Gastric AVM   Dysphagia  Lower GI bleed with acute blood loss anemia present on admit Has had multiple of these in the past.  Has had bleeding from both diverticular disease as well as an AV malformation.  Had a formal GI consult on his last admission which spontaneously resolved.   -Noted to be taking ASA prior to admit, currently on hold -Hgb was down to 9.2 from 11 at time of presentation -GI following, now s/p EGD s/p esophageal dilation. Also noted to have AVM with no active bleed which was treated with APC -Advance diet per GI -Cont to follow CBC trends  Diabetes Continue metformin Continue with SSI coverage as needed  Hyperlipidemia Continue Zocor  Cough variant asthma Continue albuterol, Pulmicort, Flonase, prednisone -On minimal O2  support at this time  History of CVA Holding his aspirin during this bleed -Remains stable  Hypertension BPs are soft and on no meds.  Will monitor  Combined systolic and diastolic CHF Gentle hydration as needed to support blood pressure Appeared euvolemic on exam this AM  GERD Continue Protonix as tolerated    DVT prophylaxis: SCD's Code Status: Full Family Communication: Pt in room, family at bedside  Status is: Inpatient  Remains inpatient appropriate because:Inpatient level of care appropriate due to severity of illness  Dispo: The patient is from: Home              Anticipated d/c is to: Home              Patient currently is not medically stable to d/c.   Difficult to place patient No   Consultants:  GI  Procedures:  EGD 8/1  Antimicrobials: Anti-infectives (From admission, onward)    None       Subjective: Seen after EGD. Reports feeling hungry, eager to eat regular food again  Objective: Vitals:   09/16/20 1444 09/16/20 1450 09/16/20 1500 09/16/20 1523  BP: 120/63 (!) 133/41 (!) 126/57 (!) 146/71  Pulse: 61 (!) 58 63 62  Resp: '17 17 14 16  '$ Temp: 98.1 F (36.7 C)   (!) 97.5 F (36.4 C)  TempSrc: Axillary   Oral  SpO2: 100% 100% 99% 100%  Weight:      Height:        Intake/Output Summary (  Last 24 hours) at 09/16/2020 1703 Last data filed at 09/16/2020 1651 Gross per 24 hour  Intake 990 ml  Output 3525 ml  Net -2535 ml    Filed Weights   09/14/20 1100  Weight: 70.3 kg    Examination: General exam: Conversant, in no acute distress Respiratory system: normal chest rise, clear, no audible wheezing Cardiovascular system: regular rhythm, s1-s2 Gastrointestinal system: Nondistended, nontender, pos BS Central nervous system: No seizures, no tremors Extremities: No cyanosis, no joint deformities Skin: No rashes, no pallor Psychiatry: Affect normal // no auditory hallucinations   Data Reviewed: I have personally reviewed following labs and  imaging studies  CBC: Recent Labs  Lab 09/14/20 0540 09/15/20 0518 09/16/20 0400  WBC 13.2* 8.8 9.2  NEUTROABS 8.8*  --   --   HGB 11.0* 9.2* 9.4*  HCT 35.4* 29.2* 29.5*  MCV 99.4 98.0 98.0  PLT 325 302 [id number]    Basic Metabolic Panel: Recent Labs  Lab 09/14/20 0540 09/16/20 0400  NA 139 138  K 3.9 3.6  CL 109 106  CO2 24 26  GLUCOSE 204* 96  BUN 16 15  CREATININE 0.86 0.66  CALCIUM 7.8* 8.5*    GFR: Estimated Creatinine Clearance: 64.7 mL/min (by C-G formula based on SCr of 0.66 mg/dL). Liver Function Tests: Recent Labs  Lab 09/14/20 0540  AST 14*  ALT 10  ALKPHOS 61  BILITOT 0.5  PROT 5.2*  ALBUMIN 3.1*    Recent Labs  Lab 09/14/20 0540  LIPASE 21    No results for input(s): AMMONIA in the last 168 hours. Coagulation Profile: No results for input(s): INR, PROTIME in the last 168 hours. Cardiac Enzymes: No results for input(s): CKTOTAL, CKMB, CKMBINDEX, TROPONINI in the last 168 hours. BNP (last 3 results) No results for input(s): PROBNP in the last 8760 hours. HbA1C: No results for input(s): HGBA1C in the last 72 hours. CBG: Recent Labs  Lab 09/14/20 2218 09/15/20 0743 09/15/20 1456 09/16/20 1125 09/16/20 1646  GLUCAP 125* 99 137* 101* 111*    Lipid Profile: No results for input(s): CHOL, HDL, LDLCALC, TRIG, CHOLHDL, LDLDIRECT in the last 72 hours. Thyroid Function Tests: No results for input(s): TSH, T4TOTAL, FREET4, T3FREE, THYROIDAB in the last 72 hours. Anemia Panel: No results for input(s): VITAMINB12, FOLATE, FERRITIN, TIBC, IRON, RETICCTPCT in the last 72 hours. Sepsis Labs: No results for input(s): PROCALCITON, LATICACIDVEN in the last 168 hours.  Recent Results (from the past 240 hour(s))  Resp Panel by RT-PCR (Flu A&B, Covid) Nasopharyngeal Swab     Status: None   Collection Time: 09/14/20  5:49 AM   Specimen: Nasopharyngeal Swab; Nasopharyngeal(NP) swabs in vial transport medium  Result Value Ref Range Status   SARS  Coronavirus 2 by RT PCR NEGATIVE NEGATIVE Final    Comment: (NOTE) SARS-CoV-2 target nucleic acids are NOT DETECTED.  The SARS-CoV-2 RNA is generally detectable in upper respiratory specimens during the acute phase of infection. The lowest concentration of SARS-CoV-2 viral copies this assay can detect is 138 copies/mL. A negative result does not preclude SARS-Cov-2 infection and should not be used as the sole basis for treatment or other patient management decisions. A negative result may occur with  improper specimen collection/handling, submission of specimen other than nasopharyngeal swab, presence of viral mutation(s) within the areas targeted by this assay, and inadequate number of viral copies(<138 copies/mL). A negative result must be combined with clinical observations, patient history, and epidemiological information. The expected result is Negative.  Fact Sheet  for Patients:  EntrepreneurPulse.com.au  Fact Sheet for Healthcare Providers:  IncredibleEmployment.be  This test is no t yet approved or cleared by the [place] FDA and  has been authorized for detection and/or diagnosis of SARS-CoV-2 by FDA under an Emergency Use Authorization (EUA). This EUA will remain  in effect (meaning this test can be used) for the duration of the COVID-19 declaration under Section 564(b)(1) of the Act, 21 U.S.C.section 360bbb-3(b)(1), unless the authorization is terminated  or revoked sooner.       Influenza A by PCR NEGATIVE NEGATIVE Final   Influenza B by PCR NEGATIVE NEGATIVE Final    Comment: (NOTE) The Xpert Xpress SARS-CoV-2/FLU/RSV plus assay is intended as an aid in the diagnosis of influenza from Nasopharyngeal swab specimens and should not be used as a sole basis for treatment. Nasal washings and aspirates are unacceptable for Xpert Xpress SARS-CoV-2/FLU/RSV testing.  Fact Sheet for  Patients: EntrepreneurPulse.com.au  Fact Sheet for Healthcare Providers: IncredibleEmployment.be  This test is not yet approved or cleared by the [place] FDA and has been authorized for detection and/or diagnosis of SARS-CoV-2 by FDA under an Emergency Use Authorization (EUA). This EUA will remain in effect (meaning this test can be used) for the duration of the COVID-19 declaration under Section 564(b)(1) of the Act, 21 U.S.C. section 360bbb-3(b)(1), unless the authorization is terminated or revoked.  Performed at [facility], [place] [address] [place], [place] [address]       Radiology Studies: No results found.  Scheduled Meds:  budesonide  2 mL Nebulization BID   insulin aspart  0-9 Units Subcutaneous TID WC   metFORMIN  500 mg Oral Q breakfast   pantoprazole  40 mg Oral Daily   predniSONE  5 mg Oral Daily   rOPINIRole  3 mg Oral QHS   simvastatin  40 mg Oral Daily   Continuous Infusions:  lactated ringers 50 mL/hr at 09/16/20 [id number]   lactated ringers 10 mL/hr at 09/16/20 1239     LOS: 2 days   Marylu Lund, MD [facility] Pager On Amion  If 7PM-7AM, please contact night-coverage 09/16/2020, 5:03 PM

## 2020-09-16 NOTE — H&P (View-Only) (Signed)
     [facility] Progress Note  CC:  Recurrent painless hematochezia   Subjective: He is feeling well this morning. No N/V. No abdominal pain. No further hematochezia. He passed a small solid black stool yesterday. No BM yet this am. No CP or SOB. Remains NPO for enteroscopy at 1pm today.    Objective:  Vital signs in last 24 hours: Temp:  [98 F (36.7 C)-98.4 F (36.9 C)] 98.2 F (36.8 C) (08/01 0612) Pulse Rate:  [58-86] 84 (08/01 0612) Resp:  [12-16] 16 (08/01 0612) BP: (124-138)/(59-86) 131/86 (08/01 0612) SpO2:  [96 %-99 %] 96 % (08/01 0612) Last BM Date: 09/14/20 General:   Alert 87 year old male in NAD. Heart: Briefly irregular rhythm then consistently regular, no murmur.  Pulm: Breath sounds clear, diminished in the bases.  Abdomen: Soft, nontender. Nondistended. + BS x 4 quads. Large midline scar intact.  Extremities:  Without edema. Neurologic:  Alert and  oriented x4;  grossly normal neurologically. Psych:  Alert and cooperative. Normal mood and affect.  Intake/Output from previous day: 07/31 0701 - 08/01 0700 In: 1750 [P.O.:1200; I.V.:550] Out: 2675 [Urine:2675] Intake/Output this shift: No intake/output data recorded.  Lab Results: Recent Labs    09/14/20 0540 09/15/20 0518 09/16/20 0400  WBC 13.2* 8.8 9.2  HGB 11.0* 9.2* 9.4*  HCT 35.4* 29.2* 29.5*  PLT 325 302 308   BMET Recent Labs    09/14/20 0540  NA 139  K 3.9  CL 109  CO2 24  GLUCOSE 204*  BUN 16  CREATININE 0.86  CALCIUM 7.8*   LFT Recent Labs    09/14/20 0540  PROT 5.2*  ALBUMIN 3.1*  AST 14*  ALT 10  ALKPHOS 61  BILITOT 0.5    Assessment / Plan:  87 year old male with a history of small bowel AVMs and diverticular bleed readmitted to the hospital 09/14/2020 with painless hematochezia with syncope. ASA on hold. Admission Hg 11.0 (Hg 13.5 on 08/20/2020) -> 9.2 today Hg level 9.4. No further hematochezia since admission. He passed a small black solid stool  yesterday.  -NPO -BMP -Proceed with push enteroscopy with Dr. Fuller Plan today -Patient did not wish to pursue a colonoscopy  -Monitor H/H closely  -Transfuse for Hg < 8  Dysphagia.  Intermittent solid food dysphagia over last few months. Symptoms progressive over last several weeks. He gives a history of an esophageal dilation with Dr. Ardis Hughs. Non-obstructing Schatzki's ring on last upper endoscopy December 2021. -Proceed with push enteroscopy today  -Continue PPI po QD  History of a CVA   LOS: 2 days   Noralyn Pick  09/16/2020, 08:33AM    Attending Physician Note   I have taken an interval history, reviewed the chart and examined the patient. I personally saw the patient and performed a substantive portion of this encounter, including a complete performance of at least one of the key components, in conjunction with the APP. I agree with the APP's note, impression and recommendations.   Recurrent GI bleeding - diverticular vs SB AVMs.  Dysphagia with history of a Schatzki ring. ABL anemia   Push enteroscopy and esophageal dilation today Pt declined colonoscopy at this time  Trend CBC   Lucio Edward, MD [facility] See AMION, [facility], for on call provider

## 2020-09-16 NOTE — Progress Notes (Addendum)
     [facility] Progress Note  CC:  Recurrent painless hematochezia   Subjective: He is feeling well this morning. No N/V. No abdominal pain. No further hematochezia. He passed a small solid black stool yesterday. No BM yet this am. No CP or SOB. Remains NPO for enteroscopy at 1pm today.    Objective:  Vital signs in last 24 hours: Temp:  [98 F (36.7 C)-98.4 F (36.9 C)] 98.2 F (36.8 C) (08/01 0612) Pulse Rate:  [58-86] 84 (08/01 0612) Resp:  [12-16] 16 (08/01 0612) BP: (124-138)/(59-86) 131/86 (08/01 0612) SpO2:  [96 %-99 %] 96 % (08/01 0612) Last BM Date: 09/14/20 General:   Alert 87 year old male in NAD. Heart: Briefly irregular rhythm then consistently regular, no murmur.  Pulm: Breath sounds clear, diminished in the bases.  Abdomen: Soft, nontender. Nondistended. + BS x 4 quads. Large midline scar intact.  Extremities:  Without edema. Neurologic:  Alert and  oriented x4;  grossly normal neurologically. Psych:  Alert and cooperative. Normal mood and affect.  Intake/Output from previous day: 07/31 0701 - 08/01 0700 In: 1750 [P.O.:1200; I.V.:550] Out: 2675 [Urine:2675] Intake/Output this shift: No intake/output data recorded.  Lab Results: Recent Labs    09/14/20 0540 09/15/20 0518 09/16/20 0400  WBC 13.2* 8.8 9.2  HGB 11.0* 9.2* 9.4*  HCT 35.4* 29.2* 29.5*  PLT 325 302 308   BMET Recent Labs    09/14/20 0540  NA 139  K 3.9  CL 109  CO2 24  GLUCOSE 204*  BUN 16  CREATININE 0.86  CALCIUM 7.8*   LFT Recent Labs    09/14/20 0540  PROT 5.2*  ALBUMIN 3.1*  AST 14*  ALT 10  ALKPHOS 61  BILITOT 0.5    Assessment / Plan:  87 year old male with a history of small bowel AVMs and diverticular bleed readmitted to the hospital 09/14/2020 with painless hematochezia with syncope. ASA on hold. Admission Hg 11.0 (Hg 13.5 on 08/20/2020) -> 9.2 today Hg level 9.4. No further hematochezia since admission. He passed a small black solid stool  yesterday.  -NPO -BMP -Proceed with push enteroscopy with Dr. Fuller Plan today -Patient did not wish to pursue a colonoscopy  -Monitor H/H closely  -Transfuse for Hg < 8  Dysphagia.  Intermittent solid food dysphagia over last few months. Symptoms progressive over last several weeks. He gives a history of an esophageal dilation with Dr. Ardis Hughs. Non-obstructing Schatzki's ring on last upper endoscopy December 2021. -Proceed with push enteroscopy today  -Continue PPI po QD  History of a CVA   LOS: 2 days   Noralyn Pick  09/16/2020, 08:33AM    Attending Physician Note   I have taken an interval history, reviewed the chart and examined the patient. I personally saw the patient and performed a substantive portion of this encounter, including a complete performance of at least one of the key components, in conjunction with the APP. I agree with the APP's note, impression and recommendations.   Recurrent GI bleeding - diverticular vs SB AVMs.  Dysphagia with history of a Schatzki ring. ABL anemia   Push enteroscopy and esophageal dilation today Pt declined colonoscopy at this time  Trend CBC   Lucio Edward, MD [facility] See AMION, [facility], for on call provider

## 2020-09-17 ENCOUNTER — Encounter (HOSPITAL_COMMUNITY): Payer: Self-pay | Admitting: Gastroenterology

## 2020-09-17 DIAGNOSIS — K222 Esophageal obstruction: Secondary | ICD-10-CM | POA: Diagnosis not present

## 2020-09-17 DIAGNOSIS — K922 Gastrointestinal hemorrhage, unspecified: Secondary | ICD-10-CM | POA: Diagnosis not present

## 2020-09-17 DIAGNOSIS — I5042 Chronic combined systolic (congestive) and diastolic (congestive) heart failure: Secondary | ICD-10-CM | POA: Diagnosis not present

## 2020-09-17 DIAGNOSIS — I10 Essential (primary) hypertension: Secondary | ICD-10-CM | POA: Diagnosis not present

## 2020-09-17 LAB — GLUCOSE, CAPILLARY
Glucose-Capillary: 94 mg/dL (ref 70–99)
Glucose-Capillary: 126 mg/dL — ABNORMAL HIGH (ref 70–99)

## 2020-09-17 LAB — CBC
HCT: 29.8 % — ABNORMAL LOW (ref 39.0–52.0)
Hemoglobin: 9.5 g/dL — ABNORMAL LOW (ref 13.0–17.0)
MCH: 30.9 pg (ref 26.0–34.0)
MCHC: 31.9 g/dL (ref 30.0–36.0)
MCV: 97.1 fL (ref 80.0–100.0)
Platelets: 344 10*3/uL (ref 150–400)
RBC: 3.07 MIL/uL — ABNORMAL LOW (ref 4.22–5.81)
RDW: 13.5 % (ref 11.5–15.5)
WBC: 9 10*3/uL (ref 4.0–10.5)
nRBC: 0 % (ref 0.0–0.2)

## 2020-09-17 NOTE — Anesthesia Postprocedure Evaluation (Signed)
Anesthesia Post Note  Patient: [name]  Procedure(s) Performed: ENTEROSCOPY HOT HEMOSTASIS (ARGON PLASMA COAGULATION/BICAP) SAVORY DILATION     Patient location during evaluation: Endoscopy Anesthesia Type: MAC Level of consciousness: awake and alert Pain management: pain level controlled Vital Signs Assessment: post-procedure vital signs reviewed and stable Respiratory status: spontaneous breathing, nonlabored ventilation, respiratory function stable and patient connected to nasal cannula oxygen Cardiovascular status: stable and blood pressure returned to baseline Postop Assessment: no apparent nausea or vomiting Anesthetic complications: no   No notable events documented.  Last Vitals:  Vitals:   09/17/20 0506 09/17/20 0843  BP: 128/86   Pulse: 64   Resp: 16   Temp: 36.6 C   SpO2: 95% 95%    Last Pain:  Vitals:   09/17/20 0808  TempSrc:   PainSc: 0-No pain                 Keeton Kassebaum

## 2020-09-17 NOTE — Discharge Summary (Signed)
Physician Discharge Summary  [name] [id number] DOB: [birth year] DOA: 09/14/2020  PCP: Seward Carol, MD  Admit date: 09/14/2020 Discharge date: 09/17/2020  Admitted From: Home Disposition:  Home  Recommendations for Outpatient Follow-up:  Follow up with PCP in 1-2 weeks Follow up with GI as scheduled  Discharge Condition:Stable CODE STATUS:Full Diet recommendation: Soft   Brief/Interim Summary: 87 y.o. male with medical history significant of type 2 diabetes, GERD, hyperlipidemia, diastolic CHF, prior CVA with multiple prior GI bleeds, last admitted in early July for similar presentation.  He reports awakening this morning at 4 AM and having frank rectal bleeding.  He thinks he lost a lot of blood while he was sitting on the toilet for a while.  He had a second episode while at home.  He came to the ED for evaluation. Pt was admitted for further work up for gi bleed  Discharge Diagnoses:  Principal Problem:   Lower GI bleed Active Problems:   Diverticulosis of colon with hemorrhage   Diabetes mellitus type II, non insulin dependent (HCC)   HLD (hyperlipidemia)   Cough variant asthma   History of CVA (cerebrovascular accident)   Benign essential HTN   BPPV (benign paroxysmal positional vertigo)   Chronic combined systolic and diastolic CHF (congestive heart failure) (HCC)   Diverticulosis   GERD (gastroesophageal reflux disease)   AVM (arteriovenous malformation) of small bowel, acquired with hemorrhage   Gastric AVM   Dysphagia  Lower GI bleed with acute blood loss anemia present on admit Noted to have multiple of these in the past.  Has had bleeding from both diverticular disease as well as an AV malformation.   -Noted to be taking ASA prior to admit, on hold at time of presentation -Hgb was down to 9.2 from 11 at time of presentation -GI following, now s/p EGD s/p esophageal dilation. Also noted to have AVM with no active bleed which was treated with  APC -Successfully advanced diet -Per GI recommendation to hold ASA x 7 days, then can resume afterwards  Diabetes Continued metformin Continue with SSI coverage while in hospital  Hyperlipidemia Continue Zocor  Cough variant asthma Continue albuterol, Pulmicort, Flonase, prednisone -On minimal O2 support  History of CVA -Remains stable -Per GI, OK to resume ASA after 7 days  Hypertension BPs are soft and on no meds.  Will monitor  Combined systolic and diastolic CHF Gentle hydration as needed to support blood pressure Appeared euvolemic on exam   GERD Continue Protonix as tolerated    Discharge Instructions   Allergies as of 09/17/2020       Reactions   Tape Other (See Comments)   SKIN IS SENSITIVE; PLEASE USE PAPER TAPE; SKIN BRUISES AND TEARS EASILY!!        Medication List     STOP taking these medications    simethicone 80 MG chewable tablet Commonly known as: MYLICON       TAKE these medications    albuterol 108 (90 Base) MCG/ACT inhaler Commonly known as: VENTOLIN HFA Inhale 2 puffs into the lungs every 6 (six) hours as needed for wheezing or shortness of breath.   aspirin EC 81 MG tablet Take 1 tablet (81 mg total) by mouth daily. Swallow whole.   budesonide 0.5 MG/2ML nebulizer solution Commonly known as: PULMICORT Take 2 mLs by nebulization See admin instructions. 59m by nebulization daily And 227mby nebulization daily as needed for shortness of breath/wheezing   diphenhydramine-acetaminophen 25-500 MG Tabs tablet Commonly known as:  TYLENOL PM Take 2 tablets by mouth at bedtime.   fluticasone 50 MCG/ACT nasal spray Commonly known as: FLONASE Place 1 spray into both nostrils daily as needed for allergies or rhinitis.   Melatonin 5 MG Caps Take 5 mg by mouth at bedtime.   metFORMIN 500 MG tablet Commonly known as: GLUCOPHAGE Take 500 mg by mouth daily.   pantoprazole 40 MG tablet Commonly known as: PROTONIX Take 1 tablet (40 mg  total) by mouth daily.   polyvinyl alcohol 1.4 % ophthalmic solution Commonly known as: LIQUIFILM TEARS Place 1 drop into both eyes as needed for dry eyes.   predniSONE 10 MG tablet Commonly known as: DELTASONE Take 5 mg by mouth daily.   rOPINIRole 3 MG tablet Commonly known as: REQUIP Take 1 tablet (3 mg total) by mouth at bedtime.   simvastatin 40 MG tablet Commonly known as: ZOCOR Take 1 tablet (40 mg total) by mouth daily.   traMADol 50 MG tablet Commonly known as: ULTRAM Take 1 tablet (50 mg total) by mouth daily as needed for moderate pain. What changed:  when to take this additional instructions   VITAMIN B-12 PO Take 2 tablets by mouth daily.   vitamin C 500 MG tablet Commonly known as: ASCORBIC ACID Take 500 mg by mouth daily.   ZINC PO Take 1 tablet by mouth daily.        Follow-up Information     Seward Carol, MD Follow up in 2 week(s).   Specialty: Internal Medicine Why: Hospital follow up Contact information: [address] [place] [address] [phone]         Loralie Champagne, PA-C Follow up on 10/08/2020.   Specialty: Gastroenterology Why: at 2pm, [facility] follow up Contact information: [address] [place] [address] [phone]                Allergies  Allergen Reactions   Tape Other (See Comments)    SKIN IS SENSITIVE; PLEASE USE PAPER TAPE; SKIN BRUISES AND TEARS EASILY!!    Consultations: GI  Procedures/Studies: CT ABDOMEN PELVIS W CONTRAST  Result Date: 08/18/2020 CLINICAL DATA:  Bright red blood per rectum. Clinical concern for diverticulitis. EXAM: CT ABDOMEN AND PELVIS WITH CONTRAST TECHNIQUE: Multidetector CT imaging of the abdomen and pelvis was performed using the standard protocol following bolus administration of intravenous contrast. CONTRAST:  71m OMNIPAQUE IOHEXOL 350 MG/ML SOLN COMPARISON:  10/12/2019 FINDINGS: Lower chest: Tiny right middle lobe perifissural nodule on image  3/series 4 is stable in the interval consistent with benign etiology. Hepatobiliary: No suspicious focal abnormality within the liver parenchyma. There is no evidence for gallstones, gallbladder wall thickening, or pericholecystic fluid. No intrahepatic or extrahepatic biliary dilation. Pancreas: No focal mass lesion. No dilatation of the main duct. No intraparenchymal cyst. No peripancreatic edema. Spleen: Splenectomy. Small soft tissue nodule just posterior to the stomach on 10/02 is stable in the interval. Adrenals/Urinary Tract: No adrenal nodule or mass. Small cyst noted both kidneys with no substantial change. No evidence for hydroureter. The urinary bladder appears normal for the degree of distention. Stomach/Bowel: Small hiatal hernia. Stomach otherwise unremarkable. Duodenum is normally positioned as is the ligament of Treitz. No small bowel wall thickening. No small bowel dilatation. The terminal ileum is normal. The appendix is not well visualized, but there is no edema or inflammation in the region of the cecum. Patient has a redundant transverse colon with diverticular disease noted in the left colon. No evidence for  diverticulitis. Colon is nondilated. Vascular/Lymphatic: There is abdominal aortic atherosclerosis without aneurysm. There is no gastrohepatic or hepatoduodenal ligament lymphadenopathy. No retroperitoneal or mesenteric lymphadenopathy. No pelvic sidewall lymphadenopathy. Reproductive: The prostate gland and seminal vesicles are unremarkable. Other: No intraperitoneal free fluid. Musculoskeletal: No worrisome lytic or sclerotic osseous abnormality. IMPRESSION: 1. No acute findings in the abdomen or pelvis. Specifically, no findings to explain the patient's history of rectal bleeding. 2. Left colonic diverticulosis without diverticulitis. 3. Small hiatal hernia. 4. Aortic Atherosclerosis (ICD10-I70.0). Electronically Signed   By: Misty Stanley M.D.   On: 08/18/2020 14:03     Subjective: Very eager to go home  Discharge Exam: Vitals:   09/17/20 0506 09/17/20 0843  BP: 128/86   Pulse: 64   Resp: 16   Temp: 97.8 F (36.6 C)   SpO2: 95% 95%   Vitals:   09/16/20 1523 09/16/20 2127 09/17/20 0506 09/17/20 0843  BP: (!) 146/71 (!) 143/80 128/86   Pulse: 62 90 64   Resp: '16 18 16   '$ Temp: (!) 97.5 F (36.4 C) 98.2 F (36.8 C) 97.8 F (36.6 C)   TempSrc: Oral Oral Oral   SpO2: 100% 97% 95% 95%  Weight:      Height:        General: Pt is alert, awake, not in acute distress Cardiovascular: RRR, S1/S2 + Respiratory: CTA bilaterally, no wheezing, no rhonchi Abdominal: Soft, NT, ND, bowel sounds + Extremities: no edema, no cyanosis   The results of significant diagnostics from this hospitalization (including imaging, microbiology, ancillary and laboratory) are listed below for reference.     Microbiology: Recent Results (from the past 240 hour(s))  Resp Panel by RT-PCR (Flu A&B, Covid) Nasopharyngeal Swab     Status: None   Collection Time: 09/14/20  5:49 AM   Specimen: Nasopharyngeal Swab; Nasopharyngeal(NP) swabs in vial transport medium  Result Value Ref Range Status   SARS Coronavirus 2 by RT PCR NEGATIVE NEGATIVE Final    Comment: (NOTE) SARS-CoV-2 target nucleic acids are NOT DETECTED.  The SARS-CoV-2 RNA is generally detectable in upper respiratory specimens during the acute phase of infection. The lowest concentration of SARS-CoV-2 viral copies this assay can detect is 138 copies/mL. A negative result does not preclude SARS-Cov-2 infection and should not be used as the sole basis for treatment or other patient management decisions. A negative result may occur with  improper specimen collection/handling, submission of specimen other than nasopharyngeal swab, presence of viral mutation(s) within the areas targeted by this assay, and inadequate number of viral copies(<138 copies/mL). A negative result must be combined with clinical  observations, patient history, and epidemiological information. The expected result is Negative.  Fact Sheet for Patients:  EntrepreneurPulse.com.au  Fact Sheet for Healthcare Providers:  IncredibleEmployment.be  This test is no t yet approved or cleared by the [place] FDA and  has been authorized for detection and/or diagnosis of SARS-CoV-2 by FDA under an Emergency Use Authorization (EUA). This EUA will remain  in effect (meaning this test can be used) for the duration of the COVID-19 declaration under Section 564(b)(1) of the Act, 21 U.S.C.section 360bbb-3(b)(1), unless the authorization is terminated  or revoked sooner.       Influenza A by PCR NEGATIVE NEGATIVE Final   Influenza B by PCR NEGATIVE NEGATIVE Final    Comment: (NOTE) The Xpert Xpress SARS-CoV-2/FLU/RSV plus assay is intended as an aid in the diagnosis of influenza from Nasopharyngeal swab specimens and should not be used as a sole  basis for treatment. Nasal washings and aspirates are unacceptable for Xpert Xpress SARS-CoV-2/FLU/RSV testing.  Fact Sheet for Patients: EntrepreneurPulse.com.au  Fact Sheet for Healthcare Providers: IncredibleEmployment.be  This test is not yet approved or cleared by the [place] FDA and has been authorized for detection and/or diagnosis of SARS-CoV-2 by FDA under an Emergency Use Authorization (EUA). This EUA will remain in effect (meaning this test can be used) for the duration of the COVID-19 declaration under Section 564(b)(1) of the Act, 21 U.S.C. section 360bbb-3(b)(1), unless the authorization is terminated or revoked.  Performed at [facility], [place] [address], [place], [place] [address]      Labs: BNP (last 3 results) No results for input(s): BNP in the last 8760 hours. Basic Metabolic Panel: Recent Labs  Lab 09/14/20 0540 09/16/20 0400  NA 139 138  K 3.9  3.6  CL 109 106  CO2 24 26  GLUCOSE 204* 96  BUN 16 15  CREATININE 0.86 0.66  CALCIUM 7.8* 8.5*   Liver Function Tests: Recent Labs  Lab 09/14/20 0540  AST 14*  ALT 10  ALKPHOS 61  BILITOT 0.5  PROT 5.2*  ALBUMIN 3.1*   Recent Labs  Lab 09/14/20 0540  LIPASE 21   No results for input(s): AMMONIA in the last 168 hours. CBC: Recent Labs  Lab 09/14/20 0540 09/15/20 0518 09/16/20 0400 09/17/20 0401  WBC 13.2* 8.8 9.2 9.0  NEUTROABS 8.8*  --   --   --   HGB 11.0* 9.2* 9.4* 9.5*  HCT 35.4* 29.2* 29.5* 29.8*  MCV 99.4 98.0 98.0 97.1  PLT 325 302 308 344   Cardiac Enzymes: No results for input(s): CKTOTAL, CKMB, CKMBINDEX, TROPONINI in the last 168 hours. BNP: Invalid input(s): POCBNP CBG: Recent Labs  Lab 09/15/20 1456 09/16/20 1125 09/16/20 1646 09/16/20 1950 09/17/20 0734  GLUCAP 137* 101* 111* 172* 94   D-Dimer No results for input(s): DDIMER in the last 72 hours. Hgb A1c No results for input(s): HGBA1C in the last 72 hours. Lipid Profile No results for input(s): CHOL, HDL, LDLCALC, TRIG, CHOLHDL, LDLDIRECT in the last 72 hours. Thyroid function studies No results for input(s): TSH, T4TOTAL, T3FREE, THYROIDAB in the last 72 hours.  Invalid input(s): FREET3 Anemia work up No results for input(s): VITAMINB12, FOLATE, FERRITIN, TIBC, IRON, RETICCTPCT in the last 72 hours. Urinalysis    Component Value Date/Time   COLORURINE YELLOW 09/14/2020 0521   APPEARANCEUR CLEAR 09/14/2020 0521   LABSPEC 1.018 09/14/2020 0521   PHURINE 6.0 09/14/2020 0521   GLUCOSEU NEGATIVE 09/14/2020 0521   HGBUR NEGATIVE 09/14/2020 0521   BILIRUBINUR NEGATIVE 09/14/2020 0521   KETONESUR NEGATIVE 09/14/2020 0521   PROTEINUR NEGATIVE 09/14/2020 0521   UROBILINOGEN 0.2 12/22/2013 1210   NITRITE NEGATIVE 09/14/2020 0521   LEUKOCYTESUR NEGATIVE 09/14/2020 0521   Sepsis Labs Invalid input(s): PROCALCITONIN,  WBC,  LACTICIDVEN Microbiology Recent Results (from the past 240  hour(s))  Resp Panel by RT-PCR (Flu A&B, Covid) Nasopharyngeal Swab     Status: None   Collection Time: 09/14/20  5:49 AM   Specimen: Nasopharyngeal Swab; Nasopharyngeal(NP) swabs in vial transport medium  Result Value Ref Range Status   SARS Coronavirus 2 by RT PCR NEGATIVE NEGATIVE Final    Comment: (NOTE) SARS-CoV-2 target nucleic acids are NOT DETECTED.  The SARS-CoV-2 RNA is generally detectable in upper respiratory specimens during the acute phase of infection. The lowest concentration of SARS-CoV-2 viral copies this assay can detect is 138 copies/mL. A negative result  does not preclude SARS-Cov-2 infection and should not be used as the sole basis for treatment or other patient management decisions. A negative result may occur with  improper specimen collection/handling, submission of specimen other than nasopharyngeal swab, presence of viral mutation(s) within the areas targeted by this assay, and inadequate number of viral copies(<138 copies/mL). A negative result must be combined with clinical observations, patient history, and epidemiological information. The expected result is Negative.  Fact Sheet for Patients:  EntrepreneurPulse.com.au  Fact Sheet for Healthcare Providers:  IncredibleEmployment.be  This test is no t yet approved or cleared by the [place] FDA and  has been authorized for detection and/or diagnosis of SARS-CoV-2 by FDA under an Emergency Use Authorization (EUA). This EUA will remain  in effect (meaning this test can be used) for the duration of the COVID-19 declaration under Section 564(b)(1) of the Act, 21 U.S.C.section 360bbb-3(b)(1), unless the authorization is terminated  or revoked sooner.       Influenza A by PCR NEGATIVE NEGATIVE Final   Influenza B by PCR NEGATIVE NEGATIVE Final    Comment: (NOTE) The Xpert Xpress SARS-CoV-2/FLU/RSV plus assay is intended as an aid in the diagnosis of influenza from  Nasopharyngeal swab specimens and should not be used as a sole basis for treatment. Nasal washings and aspirates are unacceptable for Xpert Xpress SARS-CoV-2/FLU/RSV testing.  Fact Sheet for Patients: EntrepreneurPulse.com.au  Fact Sheet for Healthcare Providers: IncredibleEmployment.be  This test is not yet approved or cleared by the [place] FDA and has been authorized for detection and/or diagnosis of SARS-CoV-2 by FDA under an Emergency Use Authorization (EUA). This EUA will remain in effect (meaning this test can be used) for the duration of the COVID-19 declaration under Section 564(b)(1) of the Act, 21 U.S.C. section 360bbb-3(b)(1), unless the authorization is terminated or revoked.  Performed at [facility], [place] [address] [place], [place] [address]    Time spent: 30 min  SIGNED:   Marylu Lund, MD  [facility] 09/17/2020, 10:39 AM  If 7PM-7AM, please contact night-coverage

## 2020-09-17 NOTE — Progress Notes (Signed)
Discharge teaching complete. Meds, diet, activity, follow up appointments reviewed and all questions answered. Copy of instructions given to patient.  

## 2020-09-17 NOTE — Discharge Instructions (Signed)
Please continue to hold your aspirin for 7 days, then resume it on 09/24/20

## 2020-09-17 NOTE — Progress Notes (Addendum)
[facility] Progress Note  CC:  Recurrent painless hematochezia   Subjective:  He feels well this morning. Still on a clear liquid diet. No N/V. No abdominal pain. He passed a normal formed brown stool last night, no black stools or rectal bleeding. He is sitting up in the chair. He wishes to go home today.    Objective:   Enteroscopy 09/16/2020: - Tortuous esophagus. - Benign-appearing esophageal stenosis. Dilated. - Medium-sized hiatal hernia. - A single non-bleeding angiodysplastic lesion in the stomach. Treated with argon plasma coagulation (APC). - Normal examined duodenum. - Two tattoos were seen in the proximal jejunum. - The proximal jejunum otherwise appeared normal. - No specimens collected.  Vital signs in last 24 hours: Temp:  [97.5 F (36.4 C)-98.2 F (36.8 C)] 97.8 F (36.6 C) (08/02 0506) Pulse Rate:  [58-90] 64 (08/02 0506) Resp:  [14-21] 16 (08/02 0506) BP: (120-155)/(41-86) 128/86 (08/02 0506) SpO2:  [95 %-100 %] 95 % (08/02 0843) Last BM Date: 09/16/20 General:   Alert in NAD. Heart: Slightly irregular rhythm, no murmur.  Pulm:  Breath sounds clear throughout.  Abdomen: Soft, nondistended. Nontender. + BS x 4 quads.  Extremities:  Without edema. Neurologic:  Alert and  oriented x4;  Grossly normal neurologically. Psych:  Alert and cooperative. Normal mood and affect.  Intake/Output from previous day: 08/01 0701 - 08/02 0700 In: [id number] [P.O.:800; I.V.:670] Out: 2900 [Urine:2900] Intake/Output this shift: Total I/O In: 240 [P.O.:240] Out: 300 [Urine:300]  Lab Results: Recent Labs    09/15/20 0518 09/16/20 0400 09/17/20 0401  WBC 8.8 9.2 9.0  HGB 9.2* 9.4* 9.5*  HCT 29.2* 29.5* 29.8*  PLT 302 308 344   BMET Recent Labs    09/16/20 0400  NA 138  K 3.6  CL 106  CO2 26  GLUCOSE 96  BUN 15  CREATININE 0.66  CALCIUM 8.5*    Assessment / Plan:  87 year old male with a history of small bowel AVMs and diverticular bleed  readmitted to the hospital 09/14/2020 with painless hematochezia with syncope. ASA on hold. Admission Hg 11.0 (Hg 13.5 on 08/20/2020) -> 9.2 today Hg level 9.5. No further hematochezia since admission. S/P enteroscopy 8/1 showed a single nonbleeding gastric AVM and a benign-appearing esophageal stenosis which was dilated.  Suspect his hematochezia was due to a diverticular bleed.  Patient declined a colonoscopy during this admission. He remains hemodynamically stable. -Ok to discharge home today from GI standpoint  -Soft diet -Continue Pantoprazole 40 mg p.o. daily indefinitely -Hold ASA '81mg'$  x 7 days -He is scheduled for a follow up appointment in our office with  Alonza Bogus PA-C on 10/08/2020 at 2pm    Dysphagia. S/P enteroscopy 09/16/2020 identified a tortuous esophagus and a benign appearing esophageal stenosis which was dilated. -See plan in #1   History of a CVA -Hold ASA for 7 days      LOS: 3 days   [name]  09/17/2020, 9:00 AM    Attending Physician Note   I have taken an interval history, reviewed the chart and examined the patient. I personally saw the patient and performed a substantive portion of this encounter, including a complete performance of at least one of the key components, in conjunction with the APP. I agree with the APP's note, impression and recommendations.   Impression: Resolved presumed diverticular bleed. Advance diet. Hold ASA for 7 more days then OK to resume as clinically indicated.    Gastric AVM treated with  APC however this does not appear to be the source of his acute bleed.   Dysphagia, esophageal stricture, S/P dilation. Long term pantoprazole 40 mg po qd and antireflux measures.    Recommendation: OK for discharge from GI standpoint.  Hold ASA for 7 more days then OK to resume.  Pantoprazole 40 mg po qd long term. Outpatient GI follow up with Dr. Ardis Hughs.  GI signing off.    Lucio Edward, MD [facility] See [facility], [facility], for our  on call provider

## 2020-09-26 ENCOUNTER — Other Ambulatory Visit: Payer: Self-pay | Admitting: Adult Health

## 2020-10-01 DIAGNOSIS — K5791 Diverticulosis of intestine, part unspecified, without perforation or abscess with bleeding: Secondary | ICD-10-CM | POA: Diagnosis not present

## 2020-10-01 DIAGNOSIS — G2581 Restless legs syndrome: Secondary | ICD-10-CM | POA: Diagnosis not present

## 2020-10-08 ENCOUNTER — Ambulatory Visit: Payer: Medicare Other | Admitting: Gastroenterology

## 2020-10-14 DIAGNOSIS — M19011 Primary osteoarthritis, right shoulder: Secondary | ICD-10-CM | POA: Diagnosis not present

## 2020-10-14 DIAGNOSIS — M19012 Primary osteoarthritis, left shoulder: Secondary | ICD-10-CM | POA: Diagnosis not present

## 2020-10-30 ENCOUNTER — Ambulatory Visit: Payer: Medicare Other | Admitting: Adult Health

## 2020-11-12 DIAGNOSIS — E119 Type 2 diabetes mellitus without complications: Secondary | ICD-10-CM | POA: Diagnosis not present

## 2020-11-12 DIAGNOSIS — E1169 Type 2 diabetes mellitus with other specified complication: Secondary | ICD-10-CM | POA: Diagnosis not present

## 2020-11-12 DIAGNOSIS — J45909 Unspecified asthma, uncomplicated: Secondary | ICD-10-CM | POA: Diagnosis not present

## 2020-11-12 DIAGNOSIS — J4541 Moderate persistent asthma with (acute) exacerbation: Secondary | ICD-10-CM | POA: Diagnosis not present

## 2020-11-12 DIAGNOSIS — G4700 Insomnia, unspecified: Secondary | ICD-10-CM | POA: Diagnosis not present

## 2020-11-12 DIAGNOSIS — J4520 Mild intermittent asthma, uncomplicated: Secondary | ICD-10-CM | POA: Diagnosis not present

## 2020-11-12 DIAGNOSIS — J4540 Moderate persistent asthma, uncomplicated: Secondary | ICD-10-CM | POA: Diagnosis not present

## 2020-11-12 DIAGNOSIS — G459 Transient cerebral ischemic attack, unspecified: Secondary | ICD-10-CM | POA: Diagnosis not present

## 2020-11-12 DIAGNOSIS — I10 Essential (primary) hypertension: Secondary | ICD-10-CM | POA: Diagnosis not present

## 2020-11-12 DIAGNOSIS — J45901 Unspecified asthma with (acute) exacerbation: Secondary | ICD-10-CM | POA: Diagnosis not present

## 2020-11-13 DIAGNOSIS — M170 Bilateral primary osteoarthritis of knee: Secondary | ICD-10-CM | POA: Diagnosis not present

## 2020-11-21 ENCOUNTER — Telehealth: Payer: Self-pay | Admitting: Gastroenterology

## 2020-11-21 NOTE — Telephone Encounter (Signed)
Left message on machine to call back  

## 2020-11-21 NOTE — Telephone Encounter (Signed)
Pt. Called requesting to speak to [facility] regarding an appt he thought he had for tomorrow, which has been cancelled and also some lab work.

## 2020-11-22 ENCOUNTER — Ambulatory Visit: Payer: Medicare Other | Admitting: Gastroenterology

## 2020-11-22 NOTE — Telephone Encounter (Signed)
The pt appt was cancelled but I do not see where another was scheduled.  (See note on appt cancellation for 10/7)  I have rescheduled him for 11/7.  I apologized to the pt for the miscommunication.  The pt also wants to have labs prior to the appt.  There is a lab order for CBC already entered.  The pt will come in for labs the morning prior.

## 2020-12-09 ENCOUNTER — Other Ambulatory Visit: Payer: Self-pay

## 2020-12-09 ENCOUNTER — Encounter: Payer: Self-pay | Admitting: Adult Health

## 2020-12-09 ENCOUNTER — Ambulatory Visit: Payer: Medicare Other | Admitting: Adult Health

## 2020-12-09 VITALS — BP 128/78 | HR 73 | Ht 70.0 in | Wt 154.0 lb

## 2020-12-09 DIAGNOSIS — R479 Unspecified speech disturbances: Secondary | ICD-10-CM

## 2020-12-09 DIAGNOSIS — G40109 Localization-related (focal) (partial) symptomatic epilepsy and epileptic syndromes with simple partial seizures, not intractable, without status epilepticus: Secondary | ICD-10-CM

## 2020-12-09 DIAGNOSIS — G459 Transient cerebral ischemic attack, unspecified: Secondary | ICD-10-CM

## 2020-12-09 MED ORDER — ZONISAMIDE 100 MG PO CAPS: 100.0000 mg | ORAL_CAPSULE | Freq: Every day | ORAL | 5 refills | Status: DC

## 2020-12-09 MED ORDER — ROPINIROLE HCL 3 MG PO TABS: 3.0000 mg | ORAL_TABLET | Freq: Every day | ORAL | 3 refills | Status: DC

## 2020-12-09 NOTE — Progress Notes (Signed)
[facility]  PATIENT: [name] DOB: 02/05/1932   REASON FOR VISIT: hx of multiple strokes/TIAs HISTORY FROM: Patient and wife   Chief Complaint  Patient presents with   Follow-up    Rm 8 wife wife here for yearly f/u- Pt reports a few TIA like events since his last f/u. Denies any falls, resides [place].       HISTORY OF PRESENT ILLNESS:  Update 12/09/2020 JM: Returns for routine stroke follow-up. Called office 05/28/2020 for concern of TIA on 05/25/2020 with speech disturbance that resolved after approximately Grant minutes.  He did not seek emergency evaluation. Reports additional episode in June - per wife, speech impairment more severe than prior episodes (eval by EMS but returned to baseline quickly - did not proceed to ED).  Reports mental fatigue after episodes which usually lasts the rest of th day. Concern of events possibly occurring while sleeping as he may wake up feeling "fuzzy" in the morning - more recently happening in September.  Similar episode 04/06/2020 (see below) and recommended trialing increased aspirin dose 81mg  to 325 mg daily (if cleared by GI) but had recurrent GI bleeds on full dose aspirin (08/2020) as well as hx of recurrent bleeds on Plavix.  He has remained on aspirin 81 mg daily without any reoccurring GI bleeds as well as simvastatin 40 mg daily without side effects.  Blood pressure today 128/78.  Routinely follows with PCP for aggressive stroke risk factor management.  Remains on Requip 3 mg nightly for longstanding RLS with occasional worsening symptoms 2-3 nights per month. Wife concerned regarding gradual decline of ambulation due to chronic generalized pain -routinely followed by orthopedics receiving cortisone injections every 3 months.  No further concerns at this time.   History provided for reference purposes only Update 04/10/2020 JM: [name] returns for acute visit accompanied by his wife with concern of possible  recurrent TIA.  Reports on 04/06/2020 he had Grant to 30 minute episode of slurred speech and slight word finding difficulty Denies weakness, facial droop, visual changes, confusion or AMS, numbness/tingling, or headache Evaluated by EMS - slightly elevated blood pressure but otherwise EKG and other vitals normal No symptoms prior to onset or after event such as fatigue or confusion  He has had similar episodes previously in 2010, 2015, 2017 and 2020 dx with TIA vs seizure type episode He denies personal prior diagnosis or family history of migraines or confirmed seizures  He is currently only on aspirin 81 mg daily as he had continued recurrent GI bleeds with acute blood loss anemia while on Plavix most recently in 12/2019.  Plavix was discontinued at that time. Continues to follow with GI personally reviewed OV note and they felt chronic Plavix use possibly contributing to continued recurrent GI bleeds.  He has not had any additional bleeding or concerns on aspirin alone.  He does question potential need of Plavix  Reports routine lab work with PCP Dr. Delfina Redwood which has been satisfactory (unable to view via epic) Blood pressure routinely monitored which has been stable  No further concerns at this time  Update 10/26/2019 JM:  [name] returns for follow-up regarding history of chronic strokes, TIAs and TIA versus seizure episode He is accompanied by his wife He has been stable from a neurological standpoint since prior visit without new or reoccurring stroke/TIA symptoms or seizure type symptoms. Residual stroke deficits of left spastic hemiparesis which has been stable not worsening Currently using Requip 2 mg  nightly for LLE "jerking" previously working well but more recently he has noticed worsening symptoms and is requesting increased Requip dosage Continues to use AFO brace and Rollator walker at all times and denies any recent falls has had ED admissions since prior visit for acute on  chronic GI bleed due to diverticulosis requiring transfusions with most recent admission 10/10/2019.  Recommended to hold Plavix for 1 week.  He has since restarted Plavix without evidence of reoccurring bleeding and denies bruising Continues on simvastatin without myalgias Blood pressure today 120/73 Continues to follow closely with PCP for HTN, HLD and DM management No further concerns   Update 09/28/2018 Dr. Leonie Man : He returns for follow-up after last visit with me in April 2019.  He is accompanied by his wife.  He had an episode of possible TIA versus seizure in March 2020.  Patient had been admitted for diverticular bleed to the hospital and had been off Plavix for 5 days.  After the patient was discharged home in the same evening the wife noticed that he had speech difficulties and was unable to speak or verbalize he had was able unable to respond to the wife and a blank look on his face.  She called 911 patient went back to the hospital this episode of speech difficulty lasted 10 to Grant minutes.  Work-up in the emergency room included a CT scan without contrast which did not show acute abnormality.  CT angiogram of the head and neck was obtained which did not show any major stenosis.  MRI scan of the brain was also obtained which showed old basal ganglia infarct without any acute infarct and changes of small vessel disease and generalized atrophy.  Patient has done well since discharge has had no further recurrent episodes of speech disturbance, TIA or seizures.  Is tolerating Plavix with only minor bruising.  His blood pressures well controlled today 121/62.  His last hemoglobin A1c was 6.6 checked 2 months ago.  He remains on Zocor which is tolerating well without myalgias but does have some knee and foot pain.  Continues to have gait and balance difficulties.  He does use a wheeled walker and states he is careful and has not had any major falls or injuries.  He is having dysphagia and plans to have  elective esophageal dilatation to be done by Dr. Ardis Hughs on 10/07/2018 and wants neurological clearance and to hold Plavix for the procedure.  Update the 06/09/2017 Dr. Leonie Man :he returns for follow-up after last for this 6 months ago. He is accompanied by his wife. He has not had any recurrent stroke or TIA symptoms. He continues to have left leg weakness and difficulty with gait and balance. He does use a wheeled walker all the time. He has not had any falls or injuries recently. He states his memory difficulties unchanged and are not progressive. His remains on Plavix which is tolerating well with only minor bruising but no bleeding. States his blood pressure is under good control today it is 124/72. He is tolerating Zocor well and last lipid profile checked for satisfactory by his primary physician. He was diagnosed with pneumonia 2 weeks ago by his primary physician and given a course of antibiotics and is now improving.  Update 11/25/2016 Dr. Leonie Man :  He returns for follow-up after last visit 4 months ago. He is accompanied by his wife. His abnormal further recurrent TIA or stroke symptoms. He had to stop Plavix for a week as he had some rectal  bleed which was felt to be diverticular bleed. He has since resumed Plavix and is doing all right without further bleeding problems. He continues to walk with a walker due to stiffness and weakness in left leg as well as left knee pain which is bothersome. He recently got intra-articular injection by Dr.: Into his left knee. Uses a wheeled walker regularly. He states is careful and has not had any recent falls. He is complaining of mild short-term memory difficulties as well as double finding words and completing sentences. He does not participate in any mentally challenging activities.  UPDATE 07/22/2016.CM [name], 87 year old [name] returns for follow-up with possible TIA event over the weekend where he had slurred speech and right hand numbness for 30 minutes. He  is currently on aspirin 325 mg daily. He is on Zocor for hyperlipidemia and is due to have labs next week his primary care. Blood pressure in the office today 149/78 he is continuing to get physical therapy 2 times a week for left lower extremity weakness since admission for stroke and 02/22/2016 . Carotid Doppler at that time 1-39% bilateral ICA stenosis negative MRA of the head MRI with right lateral thalamus and right posterior limb internal capsule infarct. He had another admission to the ER on 05/04/2016 for aphasia/ TIA. MRI without acute infarct. Marland Kitchen He lives in independent living at [facility]. He returns for reevaluation  HISTORY 07/2015 Dr. Cyndra Numbers had acute onset of dizziness on 07/25/15 and was admitted to [facility]. MRI showed tiny acute infarct in the superior left occipital lobe. CTA head and neck unremarkable. EF 45-50%, A1C 6.5, LDL not checked. His investigational meds were discontinued. He was put on plavix. Further embolic work up deferred as he would like to be discharged the 2nd day.    Discussed with pt and his wife that he had recurrent episodes of word finding difficulty. MRI on 12/22/13 showed acute infarct at left temporal cortical region. He was enrolled to RESPECT ESUS trial to compare ASA vs. pradaxa in 04/2014. However, he had another TIA episode with word finding difficulty and MRI negative. He was managed to be remained in the trial. This time his symptoms are different from previous, but MRI showed acute tiny infarct at left occipital lobe. He had 30 day cardiac monitoring in the past with Dr. Wynonia Lawman was told negative for afib. During to recurrent episodes and embolic pattern, we can either put him on coumadin and INR 2-3 or continue plavix and check TEE and consider loop recorder. Pros and cons of either approach have been discussed with pt and wife. Pt more leaning towards coumadin and wife leaning toward the other. They would like to further discuss with PCP Dr. Delfina Redwood and let me know. I  will forward the note to Dr. Delfina Redwood today and they will call him next week.          REVIEW OF SYSTEMS: Full 14 system review of systems performed and notable only for those listed in HPI, all others are neg   ALLERGIES: Allergies  Allergen Reactions   Tape Other (See Comments)    SKIN IS SENSITIVE; PLEASE USE PAPER TAPE; SKIN BRUISES AND TEARS EASILY!!    HOME MEDICATIONS: Outpatient Medications Prior to Visit  Medication Sig Dispense Refill   albuterol (VENTOLIN HFA) 108 (90 Base) MCG/ACT inhaler Inhale 2 puffs into the lungs every 6 (six) hours as needed for wheezing or shortness of breath.     aspirin EC 81 MG tablet Take 1 tablet (  81 mg total) by mouth daily. Swallow whole. 30 tablet 11   budesonide (PULMICORT) 0.5 MG/2ML nebulizer solution Take 2 mLs by nebulization See admin instructions. 50ml by nebulization daily And 42ml by nebulization daily as needed for shortness of breath/wheezing     Cyanocobalamin (VITAMIN B-12 PO) Take 2 tablets by mouth daily.     diphenhydramine-acetaminophen (TYLENOL PM) 25-500 MG TABS tablet Take 2 tablets by mouth at bedtime.     fluticasone (FLONASE) 50 MCG/ACT nasal spray Place 1 spray into both nostrils daily as needed for allergies or rhinitis.   5   Melatonin 5 MG CAPS Take 5 mg by mouth at bedtime.     metFORMIN (GLUCOPHAGE) 500 MG tablet Take 500 mg by mouth daily.     Multiple Vitamins-Minerals (ZINC PO) Take 1 tablet by mouth daily.     pantoprazole (PROTONIX) 40 MG tablet Take 1 tablet (40 mg total) by mouth daily. 90 tablet 3   polyvinyl alcohol (LIQUIFILM TEARS) 1.4 % ophthalmic solution Place 1 drop into both eyes as needed for dry eyes.     predniSONE (DELTASONE) 10 MG tablet Take 5 mg by mouth daily.      simvastatin (ZOCOR) 40 MG tablet Take 1 tablet (40 mg total) by mouth daily. 30 tablet 3   traMADol (ULTRAM) 50 MG tablet Take 1 tablet (50 mg total) by mouth daily as needed for moderate pain. 3 tablet 0   vitamin C (ASCORBIC  ACID) 500 MG tablet Take 500 mg by mouth daily.     rOPINIRole (REQUIP) 3 MG tablet Take 1 tablet (3 mg total) by mouth at bedtime. 90 tablet 3   No facility-administered medications prior to visit.    PAST MEDICAL HISTORY: Past Medical History:  Diagnosis Date   Acute CVA (cerebrovascular accident) ([place]) 07/25/2015   Asthma    Benign essential HTN    Cerebral infarction due to embolism of left middle cerebral artery ([place]) 02/03/2014   Chronic combined systolic and diastolic CHF (congestive heart failure) ([place]) 12/23/2017   Colitis    Colon polyps    Diabetes mellitus type 2, diet-controlled ([place]) 12/22/2013   Diverticulosis    GERD (gastroesophageal reflux disease)    GI bleed    GI bleed    Hemorrhoids    Hiatal hernia    History of esophageal strciture    HLD (hyperlipidemia)    Osteoarthritis    Proctitis    Status post dilation of esophageal narrowing    Stroke [facility])     PAST SURGICAL HISTORY: Past Surgical History:  Procedure Laterality Date   ABDOMINAL HERNIA REPAIR     BIOPSY  04/05/2019   Procedure: BIOPSY;  Surgeon: Thornton Mastel, MD;  Location: [place];  Service: Gastroenterology;;   COLONOSCOPY     multiple   COLONOSCOPY WITH PROPOFOL N/A 12/12/2016   Procedure: COLONOSCOPY WITH PROPOFOL;  Surgeon: Gatha Mayer, MD;  Location: [facility] ENDOSCOPY;  Service: Endoscopy;  Laterality: N/A;   COLONOSCOPY WITH PROPOFOL N/A 04/04/2019   Procedure: COLONOSCOPY WITH PROPOFOL;  Surgeon: Milus Banister, MD;  Location: [facility] ENDOSCOPY;  Service: Endoscopy;  Laterality: N/A;   ENTEROSCOPY N/A 01/16/2020   Procedure: ENTEROSCOPY;  Surgeon: Rush Landmark Telford Nab., MD;  Location: [facility] ENDOSCOPY;  Service: Gastroenterology;  Laterality: N/A;   ENTEROSCOPY N/A 01/20/2020   Procedure: ENTEROSCOPY;  Surgeon: Rush Landmark Telford Nab., MD;  Location: [facility] ENDOSCOPY;  Service: Gastroenterology;  Laterality: N/A;   ENTEROSCOPY N/A 09/16/2020   Procedure: ENTEROSCOPY;  Surgeon: Fuller Plan,  Pricilla Riffle, MD;  Location: [facility] ENDOSCOPY;  Service: Endoscopy;  Laterality: N/A;  push enteroscopy   ESOPHAGOGASTRODUODENOSCOPY     multiple   ESOPHAGOGASTRODUODENOSCOPY (EGD) WITH PROPOFOL N/A 04/05/2019   Procedure: ESOPHAGOGASTRODUODENOSCOPY (EGD) WITH PROPOFOL;  Surgeon: Thornton Gaines, MD;  Location: [place];  Service: Gastroenterology;  Laterality: N/A;   ESOPHAGOGASTRODUODENOSCOPY (EGD) WITH PROPOFOL N/A 01/18/2020   Procedure: ESOPHAGOGASTRODUODENOSCOPY (EGD) WITH PROPOFOL;  Surgeon: Doran Stabler, MD;  Location: [facility] ENDOSCOPY;  Service: Gastroenterology;  Laterality: N/A;  For placement of video capsule study   GIVENS CAPSULE STUDY N/A 01/16/2020   Procedure: [place];  Surgeon: Irving Copas., MD;  Location: [facility] ENDOSCOPY;  Service: Gastroenterology;  Laterality: N/A;   GIVENS CAPSULE STUDY N/A 01/18/2020   Procedure: GIVENS CAPSULE STUDY;  Surgeon: Doran Stabler, MD;  Location: [facility] ENDOSCOPY;  Service: Gastroenterology;  Laterality: N/A;   HOT HEMOSTASIS N/A 01/20/2020   Procedure: HOT HEMOSTASIS (ARGON PLASMA COAGULATION/BICAP);  Surgeon: Irving Copas., MD;  Location: [facility] ENDOSCOPY;  Service: Gastroenterology;  Laterality: N/A;   HOT HEMOSTASIS N/A 09/16/2020   Procedure: HOT HEMOSTASIS (ARGON PLASMA COAGULATION/BICAP);  Surgeon: Ladene Artist, MD;  Location: [facility] ENDOSCOPY;  Service: Endoscopy;  Laterality: N/A;   INSERTION OF MESH  01/29/2012   Procedure: INSERTION OF MESH;  Surgeon: Gwenyth Ober, MD;  Location: [place];  Service: General;  Laterality: N/A;   IR ANGIOGRAM SELECTIVE EACH ADDITIONAL VESSEL  04/01/2019   IR ANGIOGRAM VISCERAL SELECTIVE  04/01/2019   IR ANGIOGRAM VISCERAL SELECTIVE  04/01/2019   IR ANGIOGRAM VISCERAL SELECTIVE  04/01/2019   IR US GUIDE VASC ACCESS RIGHT  05/18/270   NISSEN FUNDOPLICATION     SAVORY DILATION N/A 01/16/2020   Procedure: SAVORY DILATION;  Surgeon: Irving Copas., MD;  Location: [facility] ENDOSCOPY;   Service: Gastroenterology;  Laterality: N/A;   SAVORY DILATION N/A 09/16/2020   Procedure: SAVORY DILATION;  Surgeon: Ladene Artist, MD;  Location: [facility] ENDOSCOPY;  Service: Endoscopy;  Laterality: N/A;   SCLEROTHERAPY  01/20/2020   Procedure: SCLEROTHERAPY;  Surgeon: Mansouraty, Telford Nab., MD;  Location: [facility] ENDOSCOPY;  Service: Gastroenterology;;   SPLENECTOMY, TOTAL     nontraumatic rupture   SUBMUCOSAL TATTOO INJECTION  01/16/2020   Procedure: SUBMUCOSAL TATTOO INJECTION;  Surgeon: Irving Copas., MD;  Location: [facility] ENDOSCOPY;  Service: Gastroenterology;;   VENTRAL HERNIA REPAIR  01/29/2012    WITH MESH   VENTRAL HERNIA REPAIR  01/29/2012   Procedure: HERNIA REPAIR VENTRAL ADULT;  Surgeon: Gwenyth Ober, MD;  Location: [place];  Service: General;  Laterality: N/A;  open recurrent ventral hernia repair with mesh    FAMILY HISTORY: Family History  Problem Relation Age of Onset   Alzheimer's disease Mother    Breast cancer Mother    Throat cancer Father    Colon cancer Neg Hx    Colon polyps Neg Hx    Pancreatic cancer Neg Hx    Stomach cancer Neg Hx    Liver disease Neg Hx     SOCIAL HISTORY: Social History   Socioeconomic History   Marital status: Married    Spouse name: [name]   Number of children: 2   Years of education: Bachelor's   Highest education level: Not on file  Occupational History   Occupation: retired  Tobacco Use   Smoking status: Never   Smokeless tobacco: Never  Substance and Sexual Activity   Alcohol use: Yes    Alcohol/week: 2.0 standard drinks  Types: 2 Glasses of wine per week    Comment: daily   Drug use: No   Sexual activity: Not on file  Other Topics Concern   Not on file  Social History Narrative   Patient is married and has 2 children.   Patient is right handed.   Patient has Bachelor's degree.   Patient drinks 2 cups daily.   Social Determinants of Health   Financial Resource Strain: Not on file  Food Insecurity: Not on  file  Transportation Needs: Not on file  Physical Activity: Not on file  Stress: Not on file  Social Connections: Not on file  Intimate Partner Violence: Not on file     PHYSICAL EXAM  Vitals:   12/09/20 1057  BP: 128/78  Pulse: 73  SpO2: 98%  Weight: 154 lb (69.9 kg)  Height: 5\' 10"  (1.778 m)    Body mass index is 22.1 kg/m.  Generalized: frail very pleasant elderly Caucasian [name], in no acute distress  Head: normocephalic and atraumatic,.   Neck: Supple, no carotid bruits  Cardiac: Regular rate rhythm, no murmur  Musculoskeletal: No deformity . Mild kyphosis  Neurological examination   Mentation: Alert oriented to time, place, history taking. Attention span and concentration appropriate. Recent and remote memory intact.  Follows all commands.  Speech and language fluent. Diminished recall - stable Cranial nerve II-XII: Pupils were equal round reactive to light extraocular movements were full, visual field were full on confrontational test. Facial sensation and strength were normal. hearing was intact to finger rubbing bilaterally. Uvula tongue midline. head turning and shoulder shrug were normal and symmetric.Tongue protrusion into cheek strength was normal. Motor: full strength right upper and lower extremity with normal bulk and tone, Mild LUE grip weakness and intrinsic hand muscles. Orbits right arm over left.  Fine finger movements are diminished on the left. LLE 4+/5 hip flexor and 4/5 ankle dorsiflexion weakness (chronic, stable) Sensory: normal and symmetric to light touch, in the upper and lower extremities Coordination: finger-nose-finger performed accurately on right side Reflexes: Brisker in the left upper and lower, plantar responses were flexor bilaterally. Gait and Station: Rising up from seated position with push off. uses a Rollator walker with mild favoring of left leg   DIAGNOSTIC DATA (LABS, IMAGING, TESTING) - I reviewed patient records, labs, notes,  testing and imaging myself where available.  Lab Results  Component Value Date   WBC 9.0 09/17/2020   HGB 9.5 (L) 09/17/2020   HCT 29.8 (L) 09/17/2020   MCV 97.1 09/17/2020   PLT 344 09/17/2020      Component Value Date/Time   NA 138 09/16/2020 0400   K 3.6 09/16/2020 0400   CL 106 09/16/2020 0400   CO2 26 09/16/2020 0400   GLUCOSE 96 09/16/2020 0400   BUN 15 09/16/2020 0400   CREATININE 0.66 09/16/2020 0400   CREATININE 0.74 02/08/2012 1209   CALCIUM 8.5 (L) 09/16/2020 0400   PROT 5.2 (L) 09/14/2020 0540   ALBUMIN 3.1 (L) 09/14/2020 0540   AST 14 (L) 09/14/2020 0540   ALT 10 09/14/2020 0540   ALKPHOS 61 09/14/2020 0540   BILITOT 0.5 09/14/2020 0540   GFRNONAA >60 09/16/2020 0400   GFRAA >60 10/13/2019 0536   Lab Results  Component Value Date   CHOL 111 04/24/2018   HDL 44 04/24/2018   LDLCALC 54 04/24/2018   TRIG 65 04/24/2018   CHOLHDL 2.5 04/24/2018   Lab Results  Component Value Date   HGBA1C 6.3 (H) 08/19/2020  ASSESSMENT AND PLAN  87 y.o. year old [name]  has a past medical history significant for multiple prior strokes/TIAs ( R PLIC stroke 1561, L occipital stroke 2017, possible TIAs vs sz's with recurrent slurred speech and 2010, 2015 x3 episodes, 2018, 2020 and 2022), HTN, HLD, DM, RLS on Requip, recurrent GI bleeds with AVM of small bowel, combined systolic and diastolic CHF and BPPV    1.  Recurrent slurred speech  -Possible TIA vs seizure vs complicated migraine although lower suspicion  -due to continued recurrent events, recommend trial of zonisamide 100 mg nightly.  Discussed potential side effects and to call with any concerns or possible need of dosage increase if additional events occur  -Continues aspirin 81 mg daily with recurrent GI bleeds on aspirin 325 mg daily and Plavix  -Continue routine follow-up with PCP for aggressive stroke risk factor management   2.  History of multiple strokes/TIAs  -See above   3.  RLS  -Continue Requip 3  mg nightly -refill provided  4.  Osteoarthritis  -Followed by orthopedics -deferred further discussion with their office in regards to possible benefit of PT or use of a different type of AD as Rollator walker has been increasing shoulder pain bilaterally    Follow-up in 6 months or call earlier if needed   CC:  Seward Carol, MD  (PCP) Dr. Leonie Man    I spent 34 minutes of face-to-face and non-face-to-face time with patient and wife.  This included previsit chart review, lab review, study review, order entry, electronic health record documentation, patient education and discussion regarding recurrent slurred speech and possible etiologies, history of multiple strokes/TIAs and importance of continued stroke risk factor management and answered all other questions to patient and wife satisfaction  Frann Rider, AGNP-BC  [facility] [address] [place] [address], [place] [address]  Phone [phone] Fax [phone] Note: This document was prepared with digital dictation and possible smart phrase technology. Any transcriptional errors that result from this process are unintentional.

## 2020-12-09 NOTE — Progress Notes (Signed)
I agree with the above plan 

## 2020-12-09 NOTE — Patient Instructions (Addendum)
Your Plan:  Start zonisamide 100mg  nightly for possible seizures   Please let me know if any additional events occur or any difficulty tolerating zonisamde   Continue Requip 3mg  nightly - may consider increasing dose in the future if needed    Follow up in 6 months or call earlier if needed     Thank you for coming to see [place] at [facility]. I hope we have been able to provide you high quality care today.  You may receive a patient satisfaction survey over the next few weeks. We would appreciate your feedback and comments so that we may continue to improve ourselves and the health of our patients.    Zonisamide capsules What is this medication? ZONISAMIDE (zoe NIS a mide) is used to control partial seizures in adults with epilepsy. This medicine may be used for other purposes; ask your health care provider or pharmacist if you have questions. COMMON BRAND NAME(S): Zonegran What should I tell my care team before I take this medication? They need to know if you have any of these conditions: kidney disease liver disease low level of bicarbonate in the blood lung or breathing disease, like asthma suicidal thoughts, plans, or attempt; a previous suicide attempt by you or a family member an unusual or allergic reaction to zonisamide, sulfa drugs, other medicines, foods, dyes, or preservatives pregnant or trying to get pregnant breast-feeding How should I use this medication? Take this medicine by mouth with a glass of water. Follow the directions on the prescription label. Do not cut, crush or chew this medicine. Swallow the capsules whole. You can take this medicine with or without food. If it upsets your stomach, take it with food. Take your medicine at regular intervals. Do not take it more often than directed. Do not stop taking except on your doctor's advice. A special MedGuide will be given to you by the pharmacist with each prescription and refill. Be sure to  read this information carefully each time. Talk to your pediatrician regarding the use of this medicine in children. While this drug may be prescribed for children as young as 16 years for selected conditions, precautions do apply. Overdosage: If you think you have taken too much of this medicine contact a poison control center or emergency room at once. NOTE: This medicine is only for you. Do not share this medicine with others. What if I miss a dose? If you miss a dose, take it as soon as you can. If it is almost time for your next dose, take only that dose. Do not take double or extra doses. What may interact with this medication? This medicine may interact with the following medications: acetazolamide antihistamines for allergy, cough, and cold atropine certain medicines for anxiety or sleep certain medicines for bladder problems like oxybutynin, tolterodine certain medicines for depression like amitriptyline, fluoxetine, sertraline certain medicines for seizures like carbamazepine, phenobarbital, phenytoin, primidone, topiramate, valproic acid certain medicines for stomach problems like dicyclomine, hyoscyamine certain medicines for travel sickness like scopolamine certain medicines for Parkinson's disease like benztropine, trihexyphenidyl dichlorphenamide general anesthetics like halothane, isoflurane, methoxyflurane, propofol ipratropium medicines that relax muscles for surgery narcotic medicines for pain phenothiazines like chlorpromazine, mesoridazine, prochlorperazine, thioridazine rifampin This list may not describe all possible interactions. Give your health care provider a list of all the medicines, herbs, non-prescription drugs, or dietary supplements you use. Also tell them if you smoke, drink alcohol, or use illegal drugs. Some items may interact with your medicine. What  should I watch for while using this medication? Visit your health care professional for regular checks  on your progress. Tell your health care professional if your symptoms do not start to get better or if they get worse. Wear a medical ID bracelet or chain. Carry a card that describes your disease and details of your medicine and dosage times. Do not stop taking except on your health care professional's advice. You may develop a severe reaction. Your health care professional will tell you how much medicine to take. You may get drowsy or dizzy. Do not drive, use machinery, or do anything that needs mental alertness until you know how this medicine affects you. Do not stand up or sit up quickly, especially if you are an older patient. This reduces the risk of dizzy or fainting spells. Alcohol may interfere with the effect of this medicine. Avoid alcoholic drinks. Tell your health care professional right away if you have any change in your eyesight. This medicine can reduce the response of your body to heat or cold. Dress warm in cold weather and stay hydrated in hot weather. If possible, avoid extreme temperatures like saunas, hot tubs, very hot or cold showers, or activities that can cause dehydration such as vigorous exercise. This medicine may cause serious skin reactions. They can happen weeks to months after starting the medicine. Contact your health care provider right away if you notice fevers or flu-like symptoms with a rash. The rash may be red or purple and then turn into blisters or peeling of the skin. Or, you might notice a red rash with swelling of the face, lips or lymph nodes in your neck or under your arms. If you or your family notice any changes in your behavior, such as new or worsening depression, thoughts of harming yourself, anxiety, other unusual or disturbing thoughts, or memory loss, call your health care professional right away. What side effects may I notice from receiving this medication? Side effects that you should report to your doctor or health care professional as soon as  possible: allergic reactions like skin rash, itching or hives, swelling of the face, lips, or tongue blood in the urine changes in vision confusion fever hallucinations loss of appetite loss of memory pain in the lower back or side pain when urinating rash, fever, and swollen lymph nodes redness, blistering, peeling or loosening of the skin, including inside the mouth signs and symptoms of increased acid in the body like breathing fast; fast heartbeat; headache; confusion; unusually weak or tired; nausea, vomiting signs and symptoms of low red blood cells or anemia such as unusually weak or tired; feeling faint or lightheaded; falls; breathing problems suicidal thoughts, mood changes unusual sweating Side effects that usually do not require medical attention (report to your doctor or health care professional if they continue or are bothersome): dizziness headache irritable nausea tiredness trouble sleeping This list may not describe all possible side effects. Call your doctor for medical advice about side effects. You may report side effects to FDA at 1-800-FDA-1088. Where should I keep my medication? Keep out of reach of children. Store at room temperature between 15 and 30 degrees C (59 and 86 degrees F). Keep in a dry place protected from light. Throw away any unused medicine after the expiration date. NOTE: This sheet is a summary. It may not cover all possible information. If you have questions about this medicine, talk to your doctor, pharmacist, or health care provider.  2022 Elsevier/Gold Standard (2018-06-09 15:13:05)

## 2020-12-23 ENCOUNTER — Other Ambulatory Visit (INDEPENDENT_AMBULATORY_CARE_PROVIDER_SITE_OTHER): Payer: Medicare Other

## 2020-12-23 ENCOUNTER — Ambulatory Visit: Payer: Medicare Other | Admitting: Gastroenterology

## 2020-12-23 ENCOUNTER — Encounter: Payer: Self-pay | Admitting: Gastroenterology

## 2020-12-23 VITALS — BP 120/63 | HR 53 | Ht 70.0 in | Wt 155.0 lb

## 2020-12-23 DIAGNOSIS — Z8719 Personal history of other diseases of the digestive system: Secondary | ICD-10-CM

## 2020-12-23 DIAGNOSIS — K625 Hemorrhage of anus and rectum: Secondary | ICD-10-CM

## 2020-12-23 LAB — CBC WITH DIFFERENTIAL/PLATELET
Basophils Absolute: 0.1 10*3/uL (ref 0.0–0.1)
Basophils Relative: 0.6 % (ref 0.0–3.0)
Eosinophils Absolute: 0.3 10*3/uL (ref 0.0–0.7)
Eosinophils Relative: 3.4 % (ref 0.0–5.0)
HCT: 39 % (ref 39.0–52.0)
Hemoglobin: 12.1 g/dL — ABNORMAL LOW (ref 13.0–17.0)
Lymphocytes Relative: 27.7 % (ref 12.0–46.0)
Lymphs Abs: 2.7 10*3/uL (ref 0.7–4.0)
MCHC: 30.9 g/dL (ref 30.0–36.0)
MCV: 79.3 fl (ref 78.0–100.0)
Monocytes Absolute: 0.9 10*3/uL (ref 0.1–1.0)
Monocytes Relative: 9.2 % (ref 3.0–12.0)
Neutro Abs: 5.8 10*3/uL (ref 1.4–7.7)
Neutrophils Relative %: 59.1 % (ref 43.0–77.0)
Platelets: 417 10*3/uL — ABNORMAL HIGH (ref 150.0–400.0)
RBC: 4.92 Mil/uL (ref 4.22–5.81)
RDW: 19.7 % — ABNORMAL HIGH (ref 11.5–15.5)
WBC: 9.8 10*3/uL (ref 4.0–10.5)

## 2020-12-23 NOTE — Progress Notes (Signed)
Review of pertinent gastrointestinal problems:  1. routine risk for colon cancer. Colonoscopy 2003 with Dr. Inocente Salles, polyps removed but none were adenomatous. Colonoscopy June 2009 Dr. Ardis Hughs found no polyps. Significant left sided diverticulosis was noted.  2. esophageal ring, focal stricture dilated 2009 with deeper than usual mucosal tear, followup Gastrografin esophagram showed no extravasation  3. status post fundoplication  4. Recurrent GI bleeding, presumed diverticular. 2009 did not require blood transfusion; 2018 he was hospitalized twice for overt red rectal bleeding, presumed diverticular.  Hb nadir 6.8 (normally Hb 14). Colonoscopy 11/2016 Dr. Carlean Purl confirmed multiple left-sided diverticulosis pockets, the prep in the right side was poor.  There was no acute blood in the colon.  He also presumed that his acute  bleeding was diverticular in origin.  Chronic Plavix use probably contributes. 01/2018 another episode Hb nadir 8 (normally Hb 12s). Received two units PRBC.  Repeat CT scan 01/2018 with IV contrast (ordered by ER I believe) showed no clear source.  Nuclear medicine bleeding scan was positive for active GI bleeding "with right colon as a suspected source of bleeding".  Follow-up interventional radiology angiogram was negative for acute bleeding.  Nuc med Meckel's scan was negative.  Repeat colonoscopy February 2021 showed diverticulosis with dark stool throughout polyp possibly melena.  EGD February 2021 showed hiatal hernia, some gastritis, essentially no obvious source of his GI bleeding.  He received a total of 5 units blood this admission.  01/2020 Admission seemed to be bleeding from a SB source (actively bleeding AVM on capsule endo), underwent 3 endoscopic procedures with eventual cautery of non-bleeding SB AVMs by enteroscopy.  Also stopped plavix in exchange for 81mg ASA daily.  Recurrent bleed July/August 2022 small bowel enteroscopy found a single gastric AVM that was not bleeding  however it was treated with APC. 5. Splenectomy; 2009, ruptured.  HPI: This is a very pleasant 87 year old man who is here with his wife today.   I last saw him here in the office about 8 or 9 months ago.     He presented 6 since then for acute lower GI bleeding again in July.  It looks like he went to the hospital twice in July.  After his first admission during which CTAbd and pelvis was performed and it showed no specific cause of his bleeding.  This was felt to be diverticular again.  His daily aspirin was changed from full dose to 81 mg once daily and at the time of his discharge his hemoglobin was 13.5.   He represented 3 weeks later with recurrent painless hematochezia.  Hemoglobin 11 and then drifted to 9.2.  He underwent upper enteroscopy.  He was found to have a benign GE junction stricture that was dilated.  A small AVM was noted in his stomach and that was treated with APC obstruction.  It was not bleeding when it was first seen.  His discharge hemoglobin on September 17, 2020 was 9.5.  His weight today is exactly the same as it was 9 months ago.  Since his hospitalization 3 months ago he has had no overt GI bleeding.  He takes an aspirin 81 mg once daily.  He is on tramadol once daily.  He is biggest issue is orthopedic pains.  He does intermittently remain constipated.  He takes fiber supplements most days but not every single day.  Oftentimes if he has a good BM he will not take it the next day.   ROS: complete GI ROS as described in HPI,  all other review negative.  Constitutional:  No unintentional weight loss   Past Medical History:  Diagnosis Date   Acute CVA (cerebrovascular accident) ([place]) 07/25/2015   Asthma    Benign essential HTN    Cerebral infarction due to embolism of left middle cerebral artery ([place]) 02/03/2014   Chronic combined systolic and diastolic CHF (congestive heart failure) ([place]) 12/23/2017   Colitis    Colon polyps    Diabetes mellitus type 2,  diet-controlled ([place]) 12/22/2013   Diverticulosis    GERD (gastroesophageal reflux disease)    GI bleed    GI bleed    Hemorrhoids    Hiatal hernia    History of esophageal strciture    HLD (hyperlipidemia)    Osteoarthritis    Proctitis    Status post dilation of esophageal narrowing    Stroke [facility])     Past Surgical History:  Procedure Laterality Date   ABDOMINAL HERNIA REPAIR     BIOPSY  04/05/2019   Procedure: BIOPSY;  Surgeon: Thornton Strong, MD;  Location: [place];  Service: Gastroenterology;;   COLONOSCOPY     multiple   COLONOSCOPY WITH PROPOFOL N/A 12/12/2016   Procedure: COLONOSCOPY WITH PROPOFOL;  Surgeon: Gatha Mayer, MD;  Location: [facility] ENDOSCOPY;  Service: Endoscopy;  Laterality: N/A;   COLONOSCOPY WITH PROPOFOL N/A 04/04/2019   Procedure: COLONOSCOPY WITH PROPOFOL;  Surgeon: Milus Banister, MD;  Location: [facility] ENDOSCOPY;  Service: Endoscopy;  Laterality: N/A;   ENTEROSCOPY N/A 01/16/2020   Procedure: ENTEROSCOPY;  Surgeon: Rush Landmark Telford Nab., MD;  Location: [facility] ENDOSCOPY;  Service: Gastroenterology;  Laterality: N/A;   ENTEROSCOPY N/A 01/20/2020   Procedure: ENTEROSCOPY;  Surgeon: Rush Landmark Telford Nab., MD;  Location: [facility] ENDOSCOPY;  Service: Gastroenterology;  Laterality: N/A;   ENTEROSCOPY N/A 09/16/2020   Procedure: ENTEROSCOPY;  Surgeon: Ladene Artist, MD;  Location: [facility] ENDOSCOPY;  Service: Endoscopy;  Laterality: N/A;  push enteroscopy   ESOPHAGOGASTRODUODENOSCOPY     multiple   ESOPHAGOGASTRODUODENOSCOPY (EGD) WITH PROPOFOL N/A 04/05/2019   Procedure: ESOPHAGOGASTRODUODENOSCOPY (EGD) WITH PROPOFOL;  Surgeon: Thornton Sundeen, MD;  Location: [place];  Service: Gastroenterology;  Laterality: N/A;   ESOPHAGOGASTRODUODENOSCOPY (EGD) WITH PROPOFOL N/A 01/18/2020   Procedure: ESOPHAGOGASTRODUODENOSCOPY (EGD) WITH PROPOFOL;  Surgeon: Doran Stabler, MD;  Location: [facility] ENDOSCOPY;  Service: Gastroenterology;  Laterality: N/A;  For placement of video  capsule study   GIVENS CAPSULE STUDY N/A 01/16/2020   Procedure: [place];  Surgeon: Irving Copas., MD;  Location: [facility] ENDOSCOPY;  Service: Gastroenterology;  Laterality: N/A;   GIVENS CAPSULE STUDY N/A 01/18/2020   Procedure: GIVENS CAPSULE STUDY;  Surgeon: Doran Stabler, MD;  Location: [facility] ENDOSCOPY;  Service: Gastroenterology;  Laterality: N/A;   HOT HEMOSTASIS N/A 01/20/2020   Procedure: HOT HEMOSTASIS (ARGON PLASMA COAGULATION/BICAP);  Surgeon: Irving Copas., MD;  Location: [facility] ENDOSCOPY;  Service: Gastroenterology;  Laterality: N/A;   HOT HEMOSTASIS N/A 09/16/2020   Procedure: HOT HEMOSTASIS (ARGON PLASMA COAGULATION/BICAP);  Surgeon: Ladene Artist, MD;  Location: [facility] ENDOSCOPY;  Service: Endoscopy;  Laterality: N/A;   INSERTION OF MESH  01/29/2012   Procedure: INSERTION OF MESH;  Surgeon: Gwenyth Ober, MD;  Location: [place];  Service: General;  Laterality: N/A;   IR ANGIOGRAM SELECTIVE EACH ADDITIONAL VESSEL  04/01/2019   IR ANGIOGRAM VISCERAL SELECTIVE  04/01/2019   IR ANGIOGRAM VISCERAL SELECTIVE  04/01/2019   IR ANGIOGRAM VISCERAL SELECTIVE  04/01/2019   IR US GUIDE VASC ACCESS RIGHT  04/01/2019  NISSEN FUNDOPLICATION     SAVORY DILATION N/A 01/16/2020   Procedure: SAVORY DILATION;  Surgeon: Rush Landmark Telford Nab., MD;  Location: [facility] ENDOSCOPY;  Service: Gastroenterology;  Laterality: N/A;   SAVORY DILATION N/A 09/16/2020   Procedure: SAVORY DILATION;  Surgeon: Ladene Artist, MD;  Location: [facility] ENDOSCOPY;  Service: Endoscopy;  Laterality: N/A;   SCLEROTHERAPY  01/20/2020   Procedure: SCLEROTHERAPY;  Surgeon: Mansouraty, Telford Nab., MD;  Location: [facility] ENDOSCOPY;  Service: Gastroenterology;;   SPLENECTOMY, TOTAL     nontraumatic rupture   SUBMUCOSAL TATTOO INJECTION  01/16/2020   Procedure: SUBMUCOSAL TATTOO INJECTION;  Surgeon: Irving Copas., MD;  Location: [facility] ENDOSCOPY;  Service: Gastroenterology;;   VENTRAL HERNIA REPAIR  01/29/2012    WITH  MESH   VENTRAL HERNIA REPAIR  01/29/2012   Procedure: HERNIA REPAIR VENTRAL ADULT;  Surgeon: Gwenyth Ober, MD;  Location: [place];  Service: General;  Laterality: N/A;  open recurrent ventral hernia repair with mesh    Current Outpatient Medications  Medication Sig Dispense Refill   albuterol (VENTOLIN HFA) 108 (90 Base) MCG/ACT inhaler Inhale 2 puffs into the lungs every 6 (six) hours as needed for wheezing or shortness of breath.     aspirin EC 81 MG tablet Take 1 tablet (81 mg total) by mouth daily. Swallow whole. 30 tablet 11   budesonide (PULMICORT) 0.5 MG/2ML nebulizer solution Take 2 mLs by nebulization See admin instructions. 25ml by nebulization daily And 76ml by nebulization daily as needed for shortness of breath/wheezing     Cyanocobalamin (VITAMIN B-12 PO) Take 2 tablets by mouth daily.     diphenhydramine-acetaminophen (TYLENOL PM) 25-500 MG TABS tablet Take 2 tablets by mouth at bedtime.     fluticasone (FLONASE) 50 MCG/ACT nasal spray Place 1 spray into both nostrils daily as needed for allergies or rhinitis.   5   Melatonin 5 MG CAPS Take 5 mg by mouth at bedtime.     metFORMIN (GLUCOPHAGE) 500 MG tablet Take 500 mg by mouth daily.     Multiple Vitamins-Minerals (ZINC PO) Take 1 tablet by mouth daily.     pantoprazole (PROTONIX) 40 MG tablet Take 1 tablet (40 mg total) by mouth daily. 90 tablet 3   polyvinyl alcohol (LIQUIFILM TEARS) 1.4 % ophthalmic solution Place 1 drop into both eyes as needed for dry eyes.     predniSONE (DELTASONE) 10 MG tablet Take 5 mg by mouth daily.      rOPINIRole (REQUIP) 3 MG tablet Take 1 tablet (3 mg total) by mouth at bedtime. 90 tablet 3   simvastatin (ZOCOR) 40 MG tablet Take 1 tablet (40 mg total) by mouth daily. 30 tablet 3   traMADol (ULTRAM) 50 MG tablet Take 1 tablet (50 mg total) by mouth daily as needed for moderate pain. 3 tablet 0   vitamin C (ASCORBIC ACID) 500 MG tablet Take 500 mg by mouth daily.     zonisamide (ZONEGRAN) 100 MG  capsule Take 1 capsule (100 mg total) by mouth daily. 30 capsule 5   No current facility-administered medications for this visit.    Allergies as of 12/23/2020 - Review Complete 12/23/2020  Allergen Reaction Noted   Tape Other (See Comments) 02/04/2018    Family History  Problem Relation Age of Onset   Alzheimer's disease Mother    Breast cancer Mother    Throat cancer Father    Colon cancer Neg Hx    Colon polyps Neg Hx    Pancreatic cancer Neg Hx  Stomach cancer Neg Hx    Liver disease Neg Hx     Social History   Socioeconomic History   Marital status: Married    Spouse name: [name]   Number of children: 2   Years of education: Bachelor's   Highest education level: Not on file  Occupational History   Occupation: retired  Tobacco Use   Smoking status: Never   Smokeless tobacco: Never  Substance and Sexual Activity   Alcohol use: Yes    Alcohol/week: 2.0 standard drinks    Types: 2 Glasses of wine per week    Comment: daily   Drug use: No   Sexual activity: Not Currently  Other Topics Concern   Not on file  Social History Narrative   Patient is married and has 2 children.   Patient is right handed.   Patient has Bachelor's degree.   Patient drinks 2 cups daily.   Social Determinants of Health   Financial Resource Strain: Not on file  Food Insecurity: Not on file  Transportation Needs: Not on file  Physical Activity: Not on file  Stress: Not on file  Social Connections: Not on file  Intimate Partner Violence: Not on file     Physical Exam: BP 120/63   Pulse (!) 53   Ht 5\' 10"  (1.778 m)   Wt 155 lb (70.3 kg)   SpO2 98%   BMI 22.24 kg/m  Constitutional: Frail, elderly man, walks with a walker today Psychiatric: alert and oriented x3 Abdomen: soft, nontender, nondistended, no obvious ascites, no peritoneal signs, normal bowel sounds No peripheral edema noted in lower extremities  Assessment and plan: 87 y.o. male with history of recurrent  rectal bleeding, mild constipation  He has had no overt bleeding since his hospitalization about 3 months ago.  Hopefully that will remain the case for a very long time.  He has had numerous episodes of presumed diverticular bleeding.  Also possibly some small bowel bleeding.  See all that summarized above.  Today I recommended that he start taking his fiber supplement on a daily basis and try not to skip days.  He will return on an as-needed basis and I will let him know how his blood counts looked at the labs which were drawn just prior to this office visit when they are back.   His wife mentioned to me that she has been having difficulty with constipation and hemorrhoids.  She is a patient of mine as well although I do not have her records in front of me.  I recommended she continue on her fiber supplement daily and add the MiraLAX on an every single day and senna as needed basis and she is going to be seeing in follow-up with me in the next few weeks, double booking is needed.  Please see the "Patient Instructions" section for addition details about the plan.  Owens Loffler, MD [facility] 12/23/2020, 3:32 PM   Total time on date of encounter was 30 minutes (this included time spent preparing to see the patient reviewing records; obtaining and/or reviewing separately obtained history; performing a medically appropriate exam and/or evaluation; counseling and educating the patient and family if present; ordering medications, tests or procedures if applicable; and documenting clinical information in the health record).

## 2020-12-23 NOTE — Patient Instructions (Addendum)
If you are age 85 or older, your body mass index should be between 23-30. Your Body mass index is 22.24 kg/m. If this is out of the aforementioned range listed, please consider follow up with your Primary Care Provider. ________________________________________________________  The [place] [facility] providers would like to encourage you to use [facility] to communicate with providers for non-urgent requests or questions.  Due to long hold times on the telephone, sending your provider a message by [facility] may be a faster and more efficient way to get a response.  Please allow 48 business hours for a response.  Please remember that this is for non-urgent requests.  _______________________________________________________  CONTINUE: Fiber daily  Please call our office with any rectal bleeding.   You will follow up with our office on an as needed basis.  Thank you for entrusting me with your care and choosing [facility].  Dr Ardis Hughs

## 2021-01-30 DIAGNOSIS — Z0000 Encounter for general adult medical examination without abnormal findings: Secondary | ICD-10-CM | POA: Diagnosis not present

## 2021-01-30 DIAGNOSIS — I519 Heart disease, unspecified: Secondary | ICD-10-CM | POA: Diagnosis not present

## 2021-01-30 DIAGNOSIS — I639 Cerebral infarction, unspecified: Secondary | ICD-10-CM | POA: Diagnosis not present

## 2021-01-30 DIAGNOSIS — J4520 Mild intermittent asthma, uncomplicated: Secondary | ICD-10-CM | POA: Diagnosis not present

## 2021-01-30 DIAGNOSIS — R269 Unspecified abnormalities of gait and mobility: Secondary | ICD-10-CM | POA: Diagnosis not present

## 2021-01-30 DIAGNOSIS — Z7984 Long term (current) use of oral hypoglycemic drugs: Secondary | ICD-10-CM | POA: Diagnosis not present

## 2021-01-30 DIAGNOSIS — J309 Allergic rhinitis, unspecified: Secondary | ICD-10-CM | POA: Diagnosis not present

## 2021-01-30 DIAGNOSIS — E119 Type 2 diabetes mellitus without complications: Secondary | ICD-10-CM | POA: Diagnosis not present

## 2021-01-30 DIAGNOSIS — E7800 Pure hypercholesterolemia, unspecified: Secondary | ICD-10-CM | POA: Diagnosis not present

## 2021-01-30 DIAGNOSIS — G4700 Insomnia, unspecified: Secondary | ICD-10-CM | POA: Diagnosis not present

## 2021-01-30 DIAGNOSIS — I10 Essential (primary) hypertension: Secondary | ICD-10-CM | POA: Diagnosis not present

## 2021-02-03 DIAGNOSIS — H35372 Puckering of macula, left eye: Secondary | ICD-10-CM | POA: Diagnosis not present

## 2021-02-03 DIAGNOSIS — H43813 Vitreous degeneration, bilateral: Secondary | ICD-10-CM | POA: Diagnosis not present

## 2021-02-03 DIAGNOSIS — H0102B Squamous blepharitis left eye, upper and lower eyelids: Secondary | ICD-10-CM | POA: Diagnosis not present

## 2021-02-03 DIAGNOSIS — H40013 Open angle with borderline findings, low risk, bilateral: Secondary | ICD-10-CM | POA: Diagnosis not present

## 2021-02-03 DIAGNOSIS — H0102A Squamous blepharitis right eye, upper and lower eyelids: Secondary | ICD-10-CM | POA: Diagnosis not present

## 2021-02-03 DIAGNOSIS — E119 Type 2 diabetes mellitus without complications: Secondary | ICD-10-CM | POA: Diagnosis not present

## 2021-02-03 DIAGNOSIS — Z961 Presence of intraocular lens: Secondary | ICD-10-CM | POA: Diagnosis not present

## 2021-02-04 ENCOUNTER — Ambulatory Visit: Payer: Medicare Other | Admitting: Physician Assistant

## 2021-02-04 ENCOUNTER — Encounter: Payer: Self-pay | Admitting: Physician Assistant

## 2021-02-04 VITALS — BP 121/60 | HR 95 | Ht 69.0 in | Wt 157.4 lb

## 2021-02-04 DIAGNOSIS — Z8719 Personal history of other diseases of the digestive system: Secondary | ICD-10-CM

## 2021-02-04 DIAGNOSIS — K449 Diaphragmatic hernia without obstruction or gangrene: Secondary | ICD-10-CM | POA: Diagnosis not present

## 2021-02-04 DIAGNOSIS — K219 Gastro-esophageal reflux disease without esophagitis: Secondary | ICD-10-CM

## 2021-02-04 DIAGNOSIS — R1310 Dysphagia, unspecified: Secondary | ICD-10-CM | POA: Diagnosis not present

## 2021-02-04 MED ORDER — PANTOPRAZOLE SODIUM 40 MG PO TBEC: 40.0000 mg | DELAYED_RELEASE_TABLET | Freq: Every day | ORAL | 3 refills | Status: AC

## 2021-02-04 NOTE — Patient Instructions (Signed)
If you are age 85 or older, your body mass index should be between 23-30. Your Body mass index is 23.24 kg/m. If this is out of the aforementioned range listed, please consider follow up with your Primary Care Provider. ________________________________________________________  The [place] [facility] providers would like to encourage you to use [facility] to communicate with providers for non-urgent requests or questions.  Due to long hold times on the telephone, sending your provider a message by [facility] may be a faster and more efficient way to get a response.  Please allow 48 business hours for a response.  Please remember that this is for non-urgent requests.  _______________________________________________________  Dennis Bast have been scheduled for an endoscopy. Please follow written instructions given to you at your visit today. If you use inhalers (even only as needed), please bring them with you on the day of your procedure.  Continue Pantoprazole 40 mg 1 tablet before breakfast.  Eat slowly, cut food into small bite size pieces and sip water between bites.  Thank you for entrusting me with your care and choosing [facility].  Amy Esterwood, PA-C

## 2021-02-04 NOTE — Progress Notes (Signed)
Subjective:    Patient ID: [name], male    DOB: [birth year], 87 y.o.   MRN: [id number]  HPI [name] is a pleasant 87 year old white male, known to Dr. Ardis Hughs who comes in today with complaints of progressive dysphagia. Patient says that he has had issues with dysphagia in the past and has had prior esophageal dilations.  He feels that he has had some worsening in symptoms over the past 6 months.  He does not have symptoms with every meal but frequently.  He is having episodes at least 3 times a month which we require regurgitation/vomiting to get the food back up.  He says sometimes if he feels food lodging he can stop eating, wait ,and food will gradually traverse.  He gets the sensation that food is lodging in his upper esophagus.  No difficulty with liquids. He is maintained on Protonix 40 mg p.o. every morning. He has multiple comorbidities including hypertension, congestive heart failure-last EF 45%, atrial fibrillation, prior history of CVAs and TIAs.  His wife said that he last had a TIA like episode about 2 months ago very transient. He is maintained on baby aspirin daily, no blood thinners or antiplatelet medications due to history of recurrent GI bleeding. He has history of diverticulosis and adult onset diabetes mellitus.  He has had several hospitalizations due to GI bleeding felt secondary to AVMs. He had been seen by Dr. Ardis Hughs in November 2022 for follow-up but says that he forgot to mention the dysphagia issues at that time.  Last EGD was done in February 2021 showing grade a esophagitis and there was a stricture at 39 cm, traversed with no resistance with the scope. EGD in December 2021 was done for bleeding with finding of a 5 cm hiatal hernia increased upper EUS tone however the scope passed with gentle steady pressure and no further dilation was done.  Enteroscopy had been done just prior to that in November 2021 again because of bleeding but he was noted to have 2  benign-appearing esophageal strictures 1 in the proximal EUS and 1 in the distal upper EUS..  These were dilated Savary to 15 mm..  Review of Systems Pertinent positive and negative review of systems were noted in the above HPI section.  All other review of systems was otherwise negative.   Outpatient Encounter Medications as of 02/04/2021  Medication Sig   albuterol (VENTOLIN HFA) 108 (90 Base) MCG/ACT inhaler Inhale 2 puffs into the lungs every 6 (six) hours as needed for wheezing or shortness of breath.   aspirin EC 81 MG tablet Take 1 tablet (81 mg total) by mouth daily. Swallow whole.   budesonide (PULMICORT) 0.5 MG/2ML nebulizer solution Take 2 mLs by nebulization See admin instructions. 5ml by nebulization daily And 56ml by nebulization daily as needed for shortness of breath/wheezing   Cyanocobalamin (VITAMIN B-12 PO) Take 2 tablets by mouth daily.   diphenhydramine-acetaminophen (TYLENOL PM) 25-500 MG TABS tablet Take 2 tablets by mouth at bedtime.   fluticasone (FLONASE) 50 MCG/ACT nasal spray Place 1 spray into both nostrils daily as needed for allergies or rhinitis.    Melatonin 5 MG CAPS Take 5 mg by mouth at bedtime.   metFORMIN (GLUCOPHAGE) 500 MG tablet Take 500 mg by mouth daily.   Multiple Vitamins-Minerals (ZINC PO) Take 1 tablet by mouth daily.   polyvinyl alcohol (LIQUIFILM TEARS) 1.4 % ophthalmic solution Place 1 drop into both eyes as needed for dry eyes.   predniSONE (DELTASONE) 10  MG tablet Take 5 mg by mouth daily.    rOPINIRole (REQUIP) 3 MG tablet Take 1 tablet (3 mg total) by mouth at bedtime.   simvastatin (ZOCOR) 40 MG tablet Take 1 tablet (40 mg total) by mouth daily.   traMADol (ULTRAM) 50 MG tablet Take 1 tablet (50 mg total) by mouth daily as needed for moderate pain.   vitamin C (ASCORBIC ACID) 500 MG tablet Take 500 mg by mouth daily.   zonisamide (ZONEGRAN) 100 MG capsule Take 1 capsule (100 mg total) by mouth daily.   [DISCONTINUED] pantoprazole (PROTONIX)  40 MG tablet Take 1 tablet (40 mg total) by mouth daily.   pantoprazole (PROTONIX) 40 MG tablet Take 1 tablet (40 mg total) by mouth daily with breakfast.   No facility-administered encounter medications on file as of 02/04/2021.   Allergies  Allergen Reactions   Tape Other (See Comments)    SKIN IS SENSITIVE; PLEASE USE PAPER TAPE; SKIN BRUISES AND TEARS EASILY!!   Patient Active Problem List   Diagnosis Date Noted   Gastric AVM    Dysphagia    AVM (arteriovenous malformation) of small bowel, acquired with hemorrhage    Acute GI bleeding 01/06/2020   Protein-calorie malnutrition, severe ([place]) 04/01/2019   DNR (do not resuscitate) 04/01/2019   Diverticulosis large intestine w/o perforation or abscess w/bleeding    GERD (gastroesophageal reflux disease) 04/21/2018   Diverticulosis    Acute blood loss anemia 12/24/2017   Syncope due to orthostatic hypotension 12/23/2017   Chronic combined systolic and diastolic CHF (congestive heart failure) ([place]) 12/23/2017   Postural dizziness with presyncope 10/25/2016   GI bleed 10/25/2016   BPPV (benign paroxysmal positional vertigo) 10/25/2016   Lower GI bleed 10/24/2016   Syncope 10/24/2016   TIA (transient ischemic attack) 07/22/2016   Benign essential HTN    PAF (paroxysmal atrial fibrillation) ([place])    Stroke-like symptom 02/22/2016   Leukocytosis 02/22/2016   Acute lower GI bleeding 02/01/2016   History of lower GI bleeding Dec 2017 02/01/2016   Chronic anticoagulation 2/2 history of CVA 01/31/2016   History of CVA (cerebrovascular accident) 01/31/2016   Sinusitis, chronic 12/12/2015   Cough variant asthma 10/31/2015   Insomnia 07/25/2015   HLD (hyperlipidemia) 02/03/2014   Ulnar neuropathy at elbow of right upper extremity 02/03/2014   Cervical radiculopathy 02/03/2014   Seizures ([place])    Diabetes mellitus type II, non insulin dependent ([place]) 12/22/2013   Recurrent ventral hernia 12/15/2011   History of esophageal strciture     Diverticulosis of colon with hemorrhage 06/24/2007   Social History   Socioeconomic History   Marital status: Married    Spouse name: [name]   Number of children: 2   Years of education: Water quality scientist   Highest education level: Not on file  Occupational History   Occupation: retired  Tobacco Use   Smoking status: Never   Smokeless tobacco: Never  Vaping Use   Vaping Use: Never used  Substance and Sexual Activity   Alcohol use: Yes    Alcohol/week: 2.0 standard drinks    Types: 2 Glasses of wine per week    Comment: daily   Drug use: No   Sexual activity: Not Currently  Other Topics Concern   Not on file  Social History Narrative   Patient is married and has 2 children.   Patient is right handed.   Patient has Bachelor's degree.   Patient drinks 2 cups daily.   Social Determinants of Radio broadcast assistant  Strain: Not on file  Food Insecurity: Not on file  Transportation Needs: Not on file  Physical Activity: Not on file  Stress: Not on file  Social Connections: Not on file  Intimate Partner Violence: Not on file    Mr. Witz family history includes Alzheimer's disease in his mother; Breast cancer in his mother; Throat cancer in his father.      Objective:    Vitals:   02/04/21 1030  BP: 121/60  Pulse: 95  SpO2: 97%    Physical Exam Well-developed well-nourished elderly white male in no acute distress.  Ambulates with a walker.  Accompanied by his wife both very pleasant Weight, 157 BMI 23.2  HEENT; nontraumatic normocephalic, EOMI, PE R LA, sclera anicteric. Oropharynx; not examined today Neck; supple, no JVD Cardiovascular; regular rate and rhythm with S1-S2, no murmur rub or gallop Pulmonary; Clear bilaterally Abdomen; soft, nontender, nondistended, no palpable mass or hepatosplenomegaly, bowel sounds are active Rectal; not done today Skin; benign exam, no jaundice rash or appreciable lesions Extremities; no clubbing cyanosis or edema skin  warm and dry, brace on the right lower extremity Neuro/Psych; alert and oriented x4, grossly nonfocal mood and affect appropriate        Assessment & Plan:   #59 87 year old white male with progressive solid food dysphagia over the past 6 months. Patient with history of chronic GERD, known large hiatal hernia/5 cm, prior history of esophageal stricture requiring dilation. Most recently had esophageal dilation done in November 2021 at the time he also underwent an enteroscopy for GI bleeding.  He was found to have 2 benign upper esophageal strictures which were Savary dilated to 15 mm.  He also has history of prior esophageal ring dilated 2009  Symptoms are consistent with recurrent esophageal stricture/ring  #2 history of recurrent GI bleeding felt secondary to AVMs.  Patient has been off of antiplatelet therapy over the past year and has not had any further issues with bleeding. #3 diverticulosis #4 adult onset diabetes mellitus 5.  Atrial fibrillation 6.  Congestive heart failure last echo-EF 45% 7.  Hypertension 8.  History of CVA/TIAs= patient had a TIA like episode about 2 months ago.  Plan; Continue Protonix 40 mg p.o. every morning Patient will be scheduled for EGD with probable esophageal dilation with Dr. Ardis Hughs.  Procedure was discussed in detail with the patient including indications risks and benefits and he is agreeable to proceed.  Procedure will intentionally be scheduled in January to allow 3 months post TIA He will continue baby aspirin 81 mg daily We discussed eating very slowly, cutting food into very small bites, and sipping fluids between bites to help prevent episodes . As of today's visit patient is appropriate for endoscopic evaluation in the ambulatory care setting.    Kyndell Zeiser Genia Harold PA-C 02/04/2021   Cc: Seward Carol, MD

## 2021-02-05 NOTE — Progress Notes (Signed)
I agree with the above note, plan 

## 2021-02-12 DIAGNOSIS — G459 Transient cerebral ischemic attack, unspecified: Secondary | ICD-10-CM | POA: Diagnosis not present

## 2021-02-12 DIAGNOSIS — E119 Type 2 diabetes mellitus without complications: Secondary | ICD-10-CM | POA: Diagnosis not present

## 2021-02-12 DIAGNOSIS — J4520 Mild intermittent asthma, uncomplicated: Secondary | ICD-10-CM | POA: Diagnosis not present

## 2021-02-12 DIAGNOSIS — E1169 Type 2 diabetes mellitus with other specified complication: Secondary | ICD-10-CM | POA: Diagnosis not present

## 2021-02-12 DIAGNOSIS — G4700 Insomnia, unspecified: Secondary | ICD-10-CM | POA: Diagnosis not present

## 2021-02-12 DIAGNOSIS — I10 Essential (primary) hypertension: Secondary | ICD-10-CM | POA: Diagnosis not present

## 2021-02-18 DIAGNOSIS — M25551 Pain in right hip: Secondary | ICD-10-CM | POA: Diagnosis not present

## 2021-02-18 DIAGNOSIS — Z9181 History of falling: Secondary | ICD-10-CM | POA: Diagnosis not present

## 2021-02-18 DIAGNOSIS — E119 Type 2 diabetes mellitus without complications: Secondary | ICD-10-CM | POA: Diagnosis not present

## 2021-02-18 DIAGNOSIS — R269 Unspecified abnormalities of gait and mobility: Secondary | ICD-10-CM | POA: Diagnosis not present

## 2021-02-18 DIAGNOSIS — I639 Cerebral infarction, unspecified: Secondary | ICD-10-CM | POA: Diagnosis not present

## 2021-02-18 DIAGNOSIS — M6281 Muscle weakness (generalized): Secondary | ICD-10-CM | POA: Diagnosis not present

## 2021-02-18 DIAGNOSIS — M5459 Other low back pain: Secondary | ICD-10-CM | POA: Diagnosis not present

## 2021-02-20 DIAGNOSIS — Z9181 History of falling: Secondary | ICD-10-CM | POA: Diagnosis not present

## 2021-02-20 DIAGNOSIS — I639 Cerebral infarction, unspecified: Secondary | ICD-10-CM | POA: Diagnosis not present

## 2021-02-20 DIAGNOSIS — M6281 Muscle weakness (generalized): Secondary | ICD-10-CM | POA: Diagnosis not present

## 2021-02-20 DIAGNOSIS — M25551 Pain in right hip: Secondary | ICD-10-CM | POA: Diagnosis not present

## 2021-02-20 DIAGNOSIS — E119 Type 2 diabetes mellitus without complications: Secondary | ICD-10-CM | POA: Diagnosis not present

## 2021-02-25 DIAGNOSIS — E119 Type 2 diabetes mellitus without complications: Secondary | ICD-10-CM | POA: Diagnosis not present

## 2021-02-25 DIAGNOSIS — M6281 Muscle weakness (generalized): Secondary | ICD-10-CM | POA: Diagnosis not present

## 2021-02-25 DIAGNOSIS — M25551 Pain in right hip: Secondary | ICD-10-CM | POA: Diagnosis not present

## 2021-02-25 DIAGNOSIS — Z9181 History of falling: Secondary | ICD-10-CM | POA: Diagnosis not present

## 2021-02-25 DIAGNOSIS — I639 Cerebral infarction, unspecified: Secondary | ICD-10-CM | POA: Diagnosis not present

## 2021-02-27 DIAGNOSIS — M25551 Pain in right hip: Secondary | ICD-10-CM | POA: Diagnosis not present

## 2021-02-27 DIAGNOSIS — Z9181 History of falling: Secondary | ICD-10-CM | POA: Diagnosis not present

## 2021-02-27 DIAGNOSIS — E119 Type 2 diabetes mellitus without complications: Secondary | ICD-10-CM | POA: Diagnosis not present

## 2021-02-27 DIAGNOSIS — I639 Cerebral infarction, unspecified: Secondary | ICD-10-CM | POA: Diagnosis not present

## 2021-02-27 DIAGNOSIS — M6281 Muscle weakness (generalized): Secondary | ICD-10-CM | POA: Diagnosis not present

## 2021-02-28 ENCOUNTER — Encounter: Payer: Self-pay | Admitting: Gastroenterology

## 2021-02-28 ENCOUNTER — Other Ambulatory Visit: Payer: Self-pay

## 2021-02-28 ENCOUNTER — Ambulatory Visit: Payer: Medicare Other | Admitting: Gastroenterology

## 2021-02-28 VITALS — BP 113/71 | HR 64 | Temp 98.4°F | Resp 14 | Ht 69.0 in | Wt 157.0 lb

## 2021-02-28 DIAGNOSIS — R1310 Dysphagia, unspecified: Secondary | ICD-10-CM

## 2021-02-28 MED ORDER — SODIUM CHLORIDE 0.9 % IV SOLN: 500.0000 mL | Freq: Once | INTRAVENOUS | Status: DC

## 2021-02-28 NOTE — Progress Notes (Signed)
°  The recent H&P (dated 02/04/2021) was reviewed, the patient was examined and there is no change in the patients condition since that H&P was completed.   Milus Banister  02/28/2021, 3:58 PM

## 2021-02-28 NOTE — Patient Instructions (Signed)
YOU HAD AN ENDOSCOPIC PROCEDURE TODAY AT THE [facility]:   Refer to the procedure report that was given to you for any specific questions about what was found during the examination.  If the procedure report does not answer your questions, please call your gastroenterologist to clarify.  If you requested that your care partner not be given the details of your procedure findings, then the procedure report has been included in a sealed envelope for you to review at your convenience later.  YOU SHOULD EXPECT: Some feelings of bloating in the abdomen. Passage of more gas than usual.  Walking can help get rid of the air that was put into your GI tract during the procedure and reduce the bloating. If you had a lower endoscopy (such as a colonoscopy or flexible sigmoidoscopy) you may notice spotting of blood in your stool or on the toilet paper. If you underwent a bowel prep for your procedure, you may not have a normal bowel movement for a few days.  Please Note:  You might notice some irritation and congestion in your nose or some drainage.  This is from the oxygen used during your procedure.  There is no need for concern and it should clear up in a day or so.  SYMPTOMS TO REPORT IMMEDIATELY:    Following upper endoscopy (EGD)  Vomiting of blood or coffee ground material  New chest pain or pain under the shoulder blades  Painful or persistently difficult swallowing  New shortness of breath  Fever of 100F or higher  Black, tarry-looking stools  For urgent or emergent issues, a gastroenterologist can be reached at any hour by calling [phone]. Do not use MyChart messaging for urgent concerns.    DIET:  We do recommend a small meal at first, but then you may proceed to your regular diet.  Drink plenty of fluids but you should avoid alcoholic beverages for 24 hours.  ACTIVITY:  You should plan to take it easy for the rest of today and you should NOT DRIVE or use heavy machinery  until tomorrow (because of the sedation medicines used during the test).    FOLLOW UP: Our staff will call the number listed on your records 48-72 hours following your procedure to check on you and address any questions or concerns that you may have regarding the information given to you following your procedure. If we do not reach you, we will leave a message.  We will attempt to reach you two times.  During this call, we will ask if you have developed any symptoms of COVID 19. If you develop any symptoms (ie: fever, flu-like symptoms, shortness of breath, cough etc.) before then, please call [phone].  If you test positive for Covid 19 in the 2 weeks post procedure, please call and report this information to us.    If any biopsies were taken you will be contacted by phone or by letter within the next 1-3 weeks.  Please call us at [phone] if you have not heard about the biopsies in 3 weeks.    SIGNATURES/CONFIDENTIALITY: You and/or your care partner have signed paperwork which will be entered into your electronic medical record.  These signatures attest to the fact that that the information above on your After Visit Summary has been reviewed and is understood.  Full responsibility of the confidentiality of this discharge information lies with you and/or your care-partner. 

## 2021-02-28 NOTE — Progress Notes (Signed)
VS by DT  Pt's states no medical or surgical changes since previsit or office visit.  

## 2021-02-28 NOTE — Progress Notes (Signed)
Pt in recovery with monitors in place, VSS. Report given to receiving RN. Bite guard was placed with pt awake to ensure comfort. No dental or soft tissue damage noted. 

## 2021-02-28 NOTE — Op Note (Signed)
[facility] Patient Name: [name] Procedure Date: 02/28/2021 4:01 PM MRN: [id number] Endoscopist: Milus Banister , MD Age: 88 Referring MD:  Date of Birth: Feb 09, 1932 Gender: Male Account #: [id number] Procedure:                Upper GI endoscopy Indications:              Dysphagia; esophageal ring, focal stricture dilated                            2009with deeper than usual mucosal tear, followup                            Gastrografin esophagram showed no extravasation Medicines:                Monitored Anesthesia Care Procedure:                Pre-Anesthesia Assessment:                           - Prior to the procedure, a History and Physical                            was performed, and patient medications and                            allergies were reviewed. The patient's tolerance of                            previous anesthesia was also reviewed. The risks                            and benefits of the procedure and the sedation                            options and risks were discussed with the patient.                            All questions were answered, and informed consent                            was obtained. Prior Anticoagulants: The patient has                            taken no previous anticoagulant or antiplatelet                            agents. ASA Grade Assessment: III - A patient with                            severe systemic disease. After reviewing the risks                            and benefits, the patient was deemed in  satisfactory condition to undergo the procedure.                           After obtaining informed consent, the endoscope was                            passed under direct vision. Throughout the                            procedure, the patient's blood pressure, pulse, and                            oxygen saturations were monitored continuously. The                             Olympus Endoscope [id number] was introduced through                            the mouth, with the intention of advancing to the                            duodenum. The scope was advanced to the                            cricopharyngeal esophagus before the procedure was                            aborted. Medications were given. The upper GI                            endoscopy was accomplished without difficulty. The                            patient tolerated the procedure well. Scope In: Scope Out: Findings:                 I was unable to intubate his esophagus. The EUS,                            crycopharyngeal region was very tortuous, possibly                            stenotic. After 4-5 attempts which caused minor                            mucosal trauma and self limited bleeding I aborted                            the examination. Complications:            No immediate complications. Estimated blood loss:                            None. Estimated Blood Loss:     Estimated blood loss: none. Impression:               -  I was unable to intubate his esophagus. The EUS,                            crycopharyngeal region was very tortuous, possibly                            stenotic. After 4-5 attempts which caused minor                            mucosal trauma and self limited bleeding I aborted                            the examination. Recommendation:           - Patient has a contact number available for                            emergencies. The signs and symptoms of potential                            delayed complications were discussed with the                            patient. Return to normal activities tomorrow.                            Written discharge instructions were provided to the                            patient.                           - Resume previous diet.                           - Continue present medications.                           -  Dr. Ardis Hughs' office will arrange barium esophagram                            for further evaluation. Milus Banister, MD 02/28/2021 4:25:24 PM This report has been signed electronically.

## 2021-03-03 ENCOUNTER — Other Ambulatory Visit: Payer: Self-pay

## 2021-03-03 ENCOUNTER — Emergency Department (HOSPITAL_BASED_OUTPATIENT_CLINIC_OR_DEPARTMENT_OTHER)
Admission: EM | Admit: 2021-03-03 | Discharge: 2021-03-03 | Disposition: A | Discharge status: Home / Self Care | Payer: Medicare Other | Attending: Emergency Medicine | Admitting: Emergency Medicine

## 2021-03-03 ENCOUNTER — Emergency Department (HOSPITAL_BASED_OUTPATIENT_CLINIC_OR_DEPARTMENT_OTHER): Payer: Medicare Other

## 2021-03-03 ENCOUNTER — Encounter (HOSPITAL_BASED_OUTPATIENT_CLINIC_OR_DEPARTMENT_OTHER): Payer: Self-pay | Admitting: Emergency Medicine

## 2021-03-03 DIAGNOSIS — Z7984 Long term (current) use of oral hypoglycemic drugs: Secondary | ICD-10-CM | POA: Diagnosis not present

## 2021-03-03 DIAGNOSIS — E119 Type 2 diabetes mellitus without complications: Secondary | ICD-10-CM | POA: Insufficient documentation

## 2021-03-03 DIAGNOSIS — M545 Low back pain, unspecified: Secondary | ICD-10-CM

## 2021-03-03 DIAGNOSIS — W01198A Fall on same level from slipping, tripping and stumbling with subsequent striking against other object, initial encounter: Secondary | ICD-10-CM | POA: Insufficient documentation

## 2021-03-03 DIAGNOSIS — R1310 Dysphagia, unspecified: Secondary | ICD-10-CM

## 2021-03-03 DIAGNOSIS — M5459 Other low back pain: Secondary | ICD-10-CM | POA: Diagnosis not present

## 2021-03-03 DIAGNOSIS — Y92002 Bathroom of unspecified non-institutional (private) residence single-family (private) house as the place of occurrence of the external cause: Secondary | ICD-10-CM | POA: Insufficient documentation

## 2021-03-03 DIAGNOSIS — Z7982 Long term (current) use of aspirin: Secondary | ICD-10-CM | POA: Diagnosis not present

## 2021-03-03 DIAGNOSIS — R079 Chest pain, unspecified: Secondary | ICD-10-CM | POA: Diagnosis not present

## 2021-03-03 DIAGNOSIS — S300XXA Contusion of lower back and pelvis, initial encounter: Secondary | ICD-10-CM | POA: Insufficient documentation

## 2021-03-03 DIAGNOSIS — S3992XA Unspecified injury of lower back, initial encounter: Secondary | ICD-10-CM | POA: Diagnosis present

## 2021-03-03 MED ORDER — CYCLOBENZAPRINE HCL 5 MG PO TABS: 5.0000 mg | ORAL_TABLET | Freq: Every day | ORAL | 0 refills | Status: AC

## 2021-03-03 MED ORDER — LIDOCAINE 5 % EX PTCH
1.0000 | MEDICATED_PATCH | CUTANEOUS | Status: DC
  Administered 2021-03-03: 1 via TRANSDERMAL
  Filled 2021-03-03: qty 1

## 2021-03-03 NOTE — ED Notes (Signed)
Lidoderm patch applied to back. Left of midline, no discoloration, swelling noted. Pt states tender to touch

## 2021-03-03 NOTE — ED Triage Notes (Signed)
Pt fell Friday in the bathroom.  Pt states he hit his back on the sink and fell to floor.  No LOC.  Unsure of head injury.  Pt has bruise to his back.  No blood thinners.

## 2021-03-03 NOTE — Progress Notes (Signed)
Barrium esophagram has been ordered per DJ. Schedulers have been notified.

## 2021-03-03 NOTE — Discharge Instructions (Signed)
Recommend continued use of Tylenol, tramadol, lidocaine patches.  Use Flexeril as needed.  Follow-up with your primary care doctor and with spine doctor whose number was provided.

## 2021-03-03 NOTE — ED Provider Notes (Signed)
[place] EMERGENCY DEPARTMENT Provider Note   CSN: [id number] Arrival date & time: 03/03/21  1109     History  Chief Complaint  Patient presents with   [name] is a 88 y.o. male.  Patient with fall 4 days ago.  Hit his back on a sink.  Did not hit his head or lose consciousness.  Having pain to his left lower back with some bruising to the left lower back.  Has been ambulatory at home.  Has not seek care since his fall.  No neck pain.  No abdominal pain.  No other extremity pain.  The history is provided by the patient.  Fall This is a new problem. Episode onset: 4 days ago. Pertinent negatives include no chest pain, no abdominal pain, no headaches and no shortness of breath. Nothing aggravates the symptoms. Nothing relieves the symptoms. He has tried acetaminophen for the symptoms. The treatment provided mild relief.      Home Medications Prior to Admission medications   Medication Sig Start Date End Date Taking? Authorizing Provider  cyclobenzaprine (FLEXERIL) 5 MG tablet Take 1 tablet (5 mg total) by mouth at bedtime for 10 doses. 03/03/21 03/12/21 Yes Jazlin Tapscott, DO  albuterol (VENTOLIN HFA) 108 (90 Base) MCG/ACT inhaler Inhale 2 puffs into the lungs every 6 (six) hours as needed for wheezing or shortness of breath.    [provider]  aspirin EC 81 MG tablet Take 1 tablet (81 mg total) by mouth daily. Swallow whole. 08/20/20 08/20/21  Rai, Ripudeep K, MD  budesonide (PULMICORT) 0.5 MG/2ML nebulizer solution Take 2 mLs by nebulization See admin instructions. 62ml by nebulization daily And 47ml by nebulization daily as needed for shortness of breath/wheezing 09/01/18   [provider]  Cyanocobalamin (VITAMIN B-12 PO) Take 2 tablets by mouth daily.    [provider]  diphenhydramine-acetaminophen (TYLENOL PM) 25-500 MG TABS tablet Take 2 tablets by mouth at bedtime.    [provider]  fluticasone (FLONASE) 50 MCG/ACT  nasal spray Place 1 spray into both nostrils daily as needed for allergies or rhinitis.  06/18/14   [provider]  Melatonin 5 MG CAPS Take 5 mg by mouth at bedtime.    [provider]  metFORMIN (GLUCOPHAGE) 500 MG tablet Take 500 mg by mouth daily. 08/23/19   [provider]  Multiple Vitamins-Minerals (ZINC PO) Take 1 tablet by mouth daily.    [provider]  pantoprazole (PROTONIX) 40 MG tablet Take 1 tablet (40 mg total) by mouth daily with breakfast. 02/04/21   Esterwood, Amy S, PA-C  polyvinyl alcohol (LIQUIFILM TEARS) 1.4 % ophthalmic solution Place 1 drop into both eyes as needed for dry eyes.    [provider]  predniSONE (DELTASONE) 10 MG tablet Take 5 mg by mouth daily.  07/11/19   [provider]  rOPINIRole (REQUIP) 3 MG tablet Take 1 tablet (3 mg total) by mouth at bedtime. 12/09/20   Frann Rider, NP  simvastatin (ZOCOR) 40 MG tablet Take 1 tablet (40 mg total) by mouth daily. 02/26/16   Amin, Jeanella Flattery, MD  traMADol (ULTRAM) 50 MG tablet Take 1 tablet (50 mg total) by mouth daily as needed for moderate pain. 01/22/20   Lavina Hamman, MD  vitamin C (ASCORBIC ACID) 500 MG tablet Take 500 mg by mouth daily.    [provider]  zonisamide (ZONEGRAN) 100 MG capsule Take 1 capsule (100 mg total) by mouth daily.  Patient not taking: Reported on 02/28/2021 12/09/20   Frann Rider, NP      Allergies    Tape    Review of Systems   Review of Systems  Respiratory:  Negative for shortness of breath.   Cardiovascular:  Negative for chest pain.  Gastrointestinal:  Negative for abdominal pain.  Neurological:  Negative for headaches.   Physical Exam Updated Vital Signs BP (!) 152/85    Pulse (!) 101    Temp 98.2 F (36.8 C) (Oral)    Resp 16    Ht 5\' 9"  (1.753 m)    Wt 71.2 kg    SpO2 100%    BMI 23.18 kg/m  Physical Exam Vitals and nursing note reviewed.  Constitutional:      General: He is not in acute distress.     Appearance: He is well-developed. He is not ill-appearing.  HENT:     Head: Normocephalic and atraumatic.     Nose: Nose normal.     Mouth/Throat:     Mouth: Mucous membranes are moist.  Eyes:     Extraocular Movements: Extraocular movements intact.     Conjunctiva/sclera: Conjunctivae normal.     Pupils: Pupils are equal, round, and reactive to light.  Cardiovascular:     Rate and Rhythm: Normal rate and regular rhythm.     Heart sounds: No murmur heard. Pulmonary:     Effort: Pulmonary effort is normal. No respiratory distress.     Breath sounds: Normal breath sounds.  Abdominal:     Palpations: Abdomen is soft.     Tenderness: There is no abdominal tenderness.  Musculoskeletal:        General: Tenderness present. No swelling. Normal range of motion.     Cervical back: Normal range of motion and neck supple. No tenderness.     Comments: No specific midline spinal tenderness, tenderness to the left flank/left lower lumbar area  Skin:    General: Skin is warm and dry.     Capillary Refill: Capillary refill takes less than 2 seconds.     Findings: Bruising present.     Comments: Bruising to his left lower back  Neurological:     General: No focal deficit present.     Mental Status: He is alert and oriented to person, place, and time.     Cranial Nerves: No cranial nerve deficit.     Sensory: No sensory deficit.     Motor: No weakness.     Coordination: Coordination normal.     Comments: 5+ out of 5 strength throughout, normal sensation  Psychiatric:        Mood and Affect: Mood normal.    ED Results / Procedures / Treatments   Labs (all labs ordered are listed, but only abnormal results are displayed) Labs Reviewed - No data to display  EKG None  Radiology DG Chest 2 View  Result Date: 03/03/2021 CLINICAL DATA:  Status post fall with pain. EXAM: CHEST - 2 VIEW COMPARISON:  August 21, 2018 FINDINGS: The heart size and mediastinal contours are within normal limits. Both  lungs are clear. The visualized skeletal structures are unremarkable. IMPRESSION: No active cardiopulmonary disease. Electronically Signed   By: Abelardo Diesel M.D.   On: 03/03/2021 12:14   DG Lumbar Spine Complete  Result Date: 03/03/2021 CLINICAL DATA:  Status post fall with back pain. EXAM: LUMBAR SPINE - COMPLETE 4+ VIEW COMPARISON:  CT abdomen pelvis August 18, 2020 FINDINGS: There is 50% compression deformity of  T10, T11 and T12 new compared to prior CT scan of July 2022. There is 30% compression deformity of L4 new compared to prior CT scan of July 2022. Scoliosis of the spine is identified. Bowel markers are noted. Extensive bowel content is identified in the colon. IMPRESSION: 1. Indeterminate age fractures of T10, T11, T12 and L4 new compared to prior CT scan of July 2022. Electronically Signed   By: Abelardo Diesel M.D.   On: 03/03/2021 12:13    Procedures Procedures    Medications Ordered in ED Medications  lidocaine (LIDODERM) 5 % 1 patch (1 patch Transdermal Patch Applied 03/03/21 1141)    ED Course/ Medical Decision Making/ A&P                           Medical Decision Making  LENOX BINK is here after mechanical fall 4 days ago.  Pain mostly to the left lower back.  History of compression fractures.  Patient mostly uses a wheelchair and walker at times.  History of diabetes.  Not on blood thinners.  He is got pain and bruising mostly to the left lower back.  No midline spinal pain.  X-rays of the ribs, low back have been ordered.  He did not hit his head or lose consciousness.  Normal neurologic exam.  No neck pain.  Given the fall was about 4 days ago have very low suspicious for intracranial injury.  No concern for neck injury given no neck pain.  My suspicion is that differential diagnosis includes fracture versus contusion.  He has been able to function fairly well with taking his tramadol at home.  Also been taking Tylenol at home.  X-rays have been reviewed and interpreted by  myself.  No rib fractures.  No pneumothorax.  Lumbar today per radiology shows age-indeterminate fractures of T10, T11, T12, L4.  However he is not particularly tender in these areas.  He is feeling better after lidocaine patch.  Overall possible that he has compression fractures but pain is well controlled.  We will have him follow-up with neurosurgery outpatient.  Recommend close follow-up with primary care doctor.  He has home support with a home nurse as well as wheelchair.  Understands return precautions and discharged in the ED in good condition.  This chart was dictated using voice recognition software.  Despite best efforts to proofread,  errors can occur which can change the documentation meaning.         Final Clinical Impression(s) / ED Diagnoses Final diagnoses:  Acute left-sided low back pain without sciatica    Rx / DC Orders ED Discharge Orders          Ordered    cyclobenzaprine (FLEXERIL) 5 MG tablet  Daily at bedtime        03/03/21 1234              Lennice Sites, DO 03/03/21 1237

## 2021-03-03 NOTE — ED Notes (Signed)
Client states the Lido Topical Patch applied decreased the back pain.. AVS then provided to and reviewed with pt, wife and caregiver. Discussed safety while using muscle relaxant and how this could lead to an increase in fall risk. Also reinforced making a follow up appt with primary care MD. Pt assisted into wc and escorted to DC desk. Opportunity for questions provided prior to DC to home

## 2021-03-04 ENCOUNTER — Telehealth: Payer: Self-pay | Admitting: *Deleted

## 2021-03-04 NOTE — Telephone Encounter (Signed)
Attempted f/u phone call. No answer. Left message. °

## 2021-03-04 NOTE — Telephone Encounter (Signed)
°  Follow up Call-  Call back number 02/28/2021  Post procedure Call Back phone  # [phone]  Permission to leave phone message Yes  Some recent data might be hidden     Patient questions:  Do you have a fever, pain , or abdominal swelling? No. Pain Score  0 *  Have you tolerated food without any problems? Yes.    Have you been able to return to your normal activities? Yes.    Do you have any questions about your discharge instructions: Diet   No. Medications  No. Follow up visit  No.  Do you have questions or concerns about your Care? No.  Actions: * If pain score is 4 or above: No action needed, pain <4.  Have you developed a fever since your procedure? no  2.   Have you had an respiratory symptoms (SOB or cough) since your procedure? no  3.   Have you tested positive for COVID 19 since your procedure no  4.   Have you had any family members/close contacts diagnosed with the COVID 19 since your procedure?  no   If yes to any of these questions please route to Joylene John, RN and Joella Prince, RN

## 2021-03-06 DIAGNOSIS — Z9181 History of falling: Secondary | ICD-10-CM | POA: Diagnosis not present

## 2021-03-06 DIAGNOSIS — M25551 Pain in right hip: Secondary | ICD-10-CM | POA: Diagnosis not present

## 2021-03-06 DIAGNOSIS — I639 Cerebral infarction, unspecified: Secondary | ICD-10-CM | POA: Diagnosis not present

## 2021-03-06 DIAGNOSIS — M5459 Other low back pain: Secondary | ICD-10-CM | POA: Diagnosis not present

## 2021-03-06 DIAGNOSIS — E119 Type 2 diabetes mellitus without complications: Secondary | ICD-10-CM | POA: Diagnosis not present

## 2021-03-06 DIAGNOSIS — R269 Unspecified abnormalities of gait and mobility: Secondary | ICD-10-CM | POA: Diagnosis not present

## 2021-03-06 DIAGNOSIS — M6281 Muscle weakness (generalized): Secondary | ICD-10-CM | POA: Diagnosis not present

## 2021-03-08 ENCOUNTER — Emergency Department (HOSPITAL_COMMUNITY): Payer: Medicare Other

## 2021-03-08 ENCOUNTER — Inpatient Hospital Stay (HOSPITAL_COMMUNITY)
Admission: EM | Admit: 2021-03-08 | Discharge: 2021-03-12 | DRG: 069 | Disposition: A | Discharge status: Skilled Nursing Facility | Payer: Medicare Other | Source: Skilled Nursing Facility | Attending: Internal Medicine | Admitting: Internal Medicine

## 2021-03-08 ENCOUNTER — Encounter (HOSPITAL_COMMUNITY): Payer: Self-pay | Admitting: Emergency Medicine

## 2021-03-08 DIAGNOSIS — M545 Low back pain, unspecified: Secondary | ICD-10-CM | POA: Diagnosis not present

## 2021-03-08 DIAGNOSIS — I110 Hypertensive heart disease with heart failure: Secondary | ICD-10-CM | POA: Diagnosis present

## 2021-03-08 DIAGNOSIS — F05 Delirium due to known physiological condition: Secondary | ICD-10-CM | POA: Diagnosis not present

## 2021-03-08 DIAGNOSIS — K5730 Diverticulosis of large intestine without perforation or abscess without bleeding: Secondary | ICD-10-CM | POA: Diagnosis not present

## 2021-03-08 DIAGNOSIS — R9431 Abnormal electrocardiogram [ECG] [EKG]: Secondary | ICD-10-CM | POA: Diagnosis not present

## 2021-03-08 DIAGNOSIS — W19XXXA Unspecified fall, initial encounter: Secondary | ICD-10-CM | POA: Diagnosis not present

## 2021-03-08 DIAGNOSIS — D75838 Other thrombocytosis: Secondary | ICD-10-CM | POA: Diagnosis not present

## 2021-03-08 DIAGNOSIS — K4090 Unilateral inguinal hernia, without obstruction or gangrene, not specified as recurrent: Secondary | ICD-10-CM | POA: Diagnosis not present

## 2021-03-08 DIAGNOSIS — R4701 Aphasia: Secondary | ICD-10-CM | POA: Diagnosis not present

## 2021-03-08 DIAGNOSIS — R29702 NIHSS score 2: Secondary | ICD-10-CM | POA: Diagnosis present

## 2021-03-08 DIAGNOSIS — G9341 Metabolic encephalopathy: Secondary | ICD-10-CM | POA: Diagnosis present

## 2021-03-08 DIAGNOSIS — I5042 Chronic combined systolic (congestive) and diastolic (congestive) heart failure: Secondary | ICD-10-CM | POA: Diagnosis present

## 2021-03-08 DIAGNOSIS — I472 Ventricular tachycardia, unspecified: Secondary | ICD-10-CM | POA: Diagnosis not present

## 2021-03-08 DIAGNOSIS — G9389 Other specified disorders of brain: Secondary | ICD-10-CM | POA: Diagnosis not present

## 2021-03-08 DIAGNOSIS — I2510 Atherosclerotic heart disease of native coronary artery without angina pectoris: Secondary | ICD-10-CM | POA: Diagnosis present

## 2021-03-08 DIAGNOSIS — Z79899 Other long term (current) drug therapy: Secondary | ICD-10-CM

## 2021-03-08 DIAGNOSIS — I6523 Occlusion and stenosis of bilateral carotid arteries: Secondary | ICD-10-CM | POA: Diagnosis not present

## 2021-03-08 DIAGNOSIS — I700 Atherosclerosis of aorta: Secondary | ICD-10-CM | POA: Diagnosis not present

## 2021-03-08 DIAGNOSIS — R1312 Dysphagia, oropharyngeal phase: Secondary | ICD-10-CM | POA: Diagnosis not present

## 2021-03-08 DIAGNOSIS — E222 Syndrome of inappropriate secretion of antidiuretic hormone: Secondary | ICD-10-CM | POA: Diagnosis present

## 2021-03-08 DIAGNOSIS — G8929 Other chronic pain: Secondary | ICD-10-CM | POA: Diagnosis not present

## 2021-03-08 DIAGNOSIS — Z8673 Personal history of transient ischemic attack (TIA), and cerebral infarction without residual deficits: Secondary | ICD-10-CM

## 2021-03-08 DIAGNOSIS — G43109 Migraine with aura, not intractable, without status migrainosus: Secondary | ICD-10-CM | POA: Diagnosis present

## 2021-03-08 DIAGNOSIS — M419 Scoliosis, unspecified: Secondary | ICD-10-CM | POA: Diagnosis not present

## 2021-03-08 DIAGNOSIS — I480 Paroxysmal atrial fibrillation: Secondary | ICD-10-CM | POA: Diagnosis not present

## 2021-03-08 DIAGNOSIS — K5900 Constipation, unspecified: Secondary | ICD-10-CM | POA: Diagnosis not present

## 2021-03-08 DIAGNOSIS — M4854XA Collapsed vertebra, not elsewhere classified, thoracic region, initial encounter for fracture: Secondary | ICD-10-CM | POA: Diagnosis present

## 2021-03-08 DIAGNOSIS — Z20822 Contact with and (suspected) exposure to covid-19: Secondary | ICD-10-CM | POA: Diagnosis not present

## 2021-03-08 DIAGNOSIS — M2550 Pain in unspecified joint: Secondary | ICD-10-CM | POA: Diagnosis not present

## 2021-03-08 DIAGNOSIS — R471 Dysarthria and anarthria: Secondary | ICD-10-CM | POA: Diagnosis present

## 2021-03-08 DIAGNOSIS — J45991 Cough variant asthma: Secondary | ICD-10-CM | POA: Diagnosis present

## 2021-03-08 DIAGNOSIS — J3489 Other specified disorders of nose and nasal sinuses: Secondary | ICD-10-CM | POA: Diagnosis not present

## 2021-03-08 DIAGNOSIS — R519 Headache, unspecified: Secondary | ICD-10-CM | POA: Diagnosis not present

## 2021-03-08 DIAGNOSIS — Z7982 Long term (current) use of aspirin: Secondary | ICD-10-CM

## 2021-03-08 DIAGNOSIS — M6281 Muscle weakness (generalized): Secondary | ICD-10-CM | POA: Diagnosis not present

## 2021-03-08 DIAGNOSIS — R569 Unspecified convulsions: Secondary | ICD-10-CM | POA: Diagnosis present

## 2021-03-08 DIAGNOSIS — G8194 Hemiplegia, unspecified affecting left nondominant side: Secondary | ICD-10-CM | POA: Diagnosis not present

## 2021-03-08 DIAGNOSIS — Z820 Family history of epilepsy and other diseases of the nervous system: Secondary | ICD-10-CM

## 2021-03-08 DIAGNOSIS — S22080A Wedge compression fracture of T11-T12 vertebra, initial encounter for closed fracture: Secondary | ICD-10-CM | POA: Diagnosis present

## 2021-03-08 DIAGNOSIS — E119 Type 2 diabetes mellitus without complications: Secondary | ICD-10-CM

## 2021-03-08 DIAGNOSIS — D649 Anemia, unspecified: Secondary | ICD-10-CM | POA: Diagnosis not present

## 2021-03-08 DIAGNOSIS — Z803 Family history of malignant neoplasm of breast: Secondary | ICD-10-CM

## 2021-03-08 DIAGNOSIS — K219 Gastro-esophageal reflux disease without esophagitis: Secondary | ICD-10-CM | POA: Diagnosis present

## 2021-03-08 DIAGNOSIS — M549 Dorsalgia, unspecified: Secondary | ICD-10-CM | POA: Diagnosis not present

## 2021-03-08 DIAGNOSIS — E785 Hyperlipidemia, unspecified: Secondary | ICD-10-CM | POA: Diagnosis present

## 2021-03-08 DIAGNOSIS — Z7902 Long term (current) use of antithrombotics/antiplatelets: Secondary | ICD-10-CM

## 2021-03-08 DIAGNOSIS — G459 Transient cerebral ischemic attack, unspecified: Secondary | ICD-10-CM | POA: Diagnosis not present

## 2021-03-08 DIAGNOSIS — K449 Diaphragmatic hernia without obstruction or gangrene: Secondary | ICD-10-CM | POA: Diagnosis not present

## 2021-03-08 DIAGNOSIS — R29810 Facial weakness: Secondary | ICD-10-CM | POA: Diagnosis present

## 2021-03-08 DIAGNOSIS — M1990 Unspecified osteoarthritis, unspecified site: Secondary | ICD-10-CM | POA: Diagnosis present

## 2021-03-08 DIAGNOSIS — Z743 Need for continuous supervision: Secondary | ICD-10-CM | POA: Diagnosis not present

## 2021-03-08 DIAGNOSIS — K222 Esophageal obstruction: Secondary | ICD-10-CM | POA: Diagnosis not present

## 2021-03-08 DIAGNOSIS — Z86010 Personal history of colonic polyps: Secondary | ICD-10-CM

## 2021-03-08 DIAGNOSIS — Z7951 Long term (current) use of inhaled steroids: Secondary | ICD-10-CM

## 2021-03-08 DIAGNOSIS — Z7401 Bed confinement status: Secondary | ICD-10-CM | POA: Diagnosis not present

## 2021-03-08 DIAGNOSIS — R262 Difficulty in walking, not elsewhere classified: Secondary | ICD-10-CM | POA: Diagnosis not present

## 2021-03-08 DIAGNOSIS — Z7952 Long term (current) use of systemic steroids: Secondary | ICD-10-CM

## 2021-03-08 DIAGNOSIS — Z808 Family history of malignant neoplasm of other organs or systems: Secondary | ICD-10-CM

## 2021-03-08 DIAGNOSIS — M542 Cervicalgia: Secondary | ICD-10-CM | POA: Diagnosis not present

## 2021-03-08 DIAGNOSIS — S22080D Wedge compression fracture of T11-T12 vertebra, subsequent encounter for fracture with routine healing: Secondary | ICD-10-CM | POA: Diagnosis not present

## 2021-03-08 DIAGNOSIS — Z7984 Long term (current) use of oral hypoglycemic drugs: Secondary | ICD-10-CM

## 2021-03-08 LAB — DIFFERENTIAL
Abs Immature Granulocytes: 0.02 10*3/uL (ref 0.00–0.07)
Basophils Absolute: 0.1 10*3/uL (ref 0.0–0.1)
Basophils Relative: 1 %
Eosinophils Absolute: 0.5 10*3/uL (ref 0.0–0.5)
Eosinophils Relative: 5 %
Immature Granulocytes: 0 %
Lymphocytes Relative: 23 %
Lymphs Abs: 2 10*3/uL (ref 0.7–4.0)
Monocytes Absolute: 1.2 10*3/uL — ABNORMAL HIGH (ref 0.1–1.0)
Monocytes Relative: 13 %
Neutro Abs: 5.2 10*3/uL (ref 1.7–7.7)
Neutrophils Relative %: 58 %

## 2021-03-08 LAB — I-STAT CHEM 8, ED
BUN: 9 mg/dL (ref 8–23)
Calcium, Ion: 1.14 mmol/L — ABNORMAL LOW (ref 1.15–1.40)
Chloride: 100 mmol/L (ref 98–111)
Creatinine, Ser: 0.7 mg/dL (ref 0.61–1.24)
Glucose, Bld: 107 mg/dL — ABNORMAL HIGH (ref 70–99)
HCT: 39 % (ref 39.0–52.0)
Hemoglobin: 13.3 g/dL (ref 13.0–17.0)
Potassium: 4.1 mmol/L (ref 3.5–5.1)
Sodium: 136 mmol/L (ref 135–145)
TCO2: 28 mmol/L (ref 22–32)

## 2021-03-08 LAB — URINALYSIS, ROUTINE W REFLEX MICROSCOPIC
Bilirubin Urine: NEGATIVE
Glucose, UA: NEGATIVE mg/dL
Hgb urine dipstick: NEGATIVE
Ketones, ur: NEGATIVE mg/dL
Leukocytes,Ua: NEGATIVE
Nitrite: NEGATIVE
Protein, ur: NEGATIVE mg/dL
Specific Gravity, Urine: 1.006 (ref 1.005–1.030)
pH: 6 (ref 5.0–8.0)

## 2021-03-08 LAB — COMPREHENSIVE METABOLIC PANEL
ALT: 11 U/L (ref 0–44)
AST: 18 U/L (ref 15–41)
Albumin: 3.7 g/dL (ref 3.5–5.0)
Alkaline Phosphatase: 71 U/L (ref 38–126)
Anion gap: 10 (ref 5–15)
BUN: 10 mg/dL (ref 8–23)
CO2: 24 mmol/L (ref 22–32)
Calcium: 8.8 mg/dL — ABNORMAL LOW (ref 8.9–10.3)
Chloride: 102 mmol/L (ref 98–111)
Creatinine, Ser: 0.78 mg/dL (ref 0.61–1.24)
GFR, Estimated: >60 mL/min (ref >60)
Glucose, Bld: 108 mg/dL — ABNORMAL HIGH (ref 70–99)
Potassium: 4.2 mmol/L (ref 3.5–5.1)
Sodium: 136 mmol/L (ref 135–145)
Total Bilirubin: 0.4 mg/dL (ref 0.3–1.2)
Total Protein: 6.4 g/dL — ABNORMAL LOW (ref 6.5–8.1)

## 2021-03-08 LAB — CBC
HCT: 36.7 % — ABNORMAL LOW (ref 39.0–52.0)
Hemoglobin: 11.2 g/dL — ABNORMAL LOW (ref 13.0–17.0)
MCH: 26.8 pg (ref 26.0–34.0)
MCHC: 30.5 g/dL (ref 30.0–36.0)
MCV: 87.8 fL (ref 80.0–100.0)
Platelets: 491 10*3/uL — ABNORMAL HIGH (ref 150–400)
RBC: 4.18 MIL/uL — ABNORMAL LOW (ref 4.22–5.81)
RDW: 19.4 % — ABNORMAL HIGH (ref 11.5–15.5)
WBC: 8.9 10*3/uL (ref 4.0–10.5)
nRBC: 0 % (ref 0.0–0.2)

## 2021-03-08 LAB — RESP PANEL BY RT-PCR (FLU A&B, COVID) ARPGX2
Influenza A by PCR: NEGATIVE
Influenza B by PCR: NEGATIVE
SARS Coronavirus 2 by RT PCR: NEGATIVE

## 2021-03-08 LAB — PROTIME-INR
INR: 1 (ref 0.8–1.2)
Prothrombin Time: 12.7 seconds (ref 11.4–15.2)

## 2021-03-08 LAB — APTT: aPTT: 25 seconds (ref 24–36)

## 2021-03-08 LAB — ETHANOL: Alcohol, Ethyl (B): <10 mg/dL (ref <10)

## 2021-03-08 NOTE — ED Provider Notes (Signed)
[place] EMERGENCY DEPARTMENT Provider Note   CSN: [id number] Arrival date & time: 03/08/21  1911     History  No chief complaint on file.   [name] is a 88 y.o. male with threefold complaint.  History is gathered from the patient, EMR and EMS at bedside. 1) The patient had onset of TIA symptoms today. He had onset of word finding difficulty and unintelligible speech lasting approximately 15 minutes with complete resolution.  He has a history of TIAs in the past.  He has a history of CVA. 2) patient complains of back pain.  Patient was seen for an initial fall on 03/03/2021.  He was noted to have age-indeterminate fractures on plain film.  Since that time he has had severe pain with any movement. 3) patient has been constipated since his fall and has not had a bowel movement in the last 6 days.  HPI     Home Medications Prior to Admission medications   Medication Sig Start Date End Date Taking? Authorizing Provider  albuterol (VENTOLIN HFA) 108 (90 Base) MCG/ACT inhaler Inhale 2 puffs into the lungs every 6 (six) hours as needed for wheezing or shortness of breath.    [provider]  aspirin EC 81 MG tablet Take 1 tablet (81 mg total) by mouth daily. Swallow whole. 08/20/20 08/20/21  Rai, Ripudeep K, MD  budesonide (PULMICORT) 0.5 MG/2ML nebulizer solution Take 2 mLs by nebulization See admin instructions. 6ml by nebulization daily And 66ml by nebulization daily as needed for shortness of breath/wheezing 09/01/18   [provider]  Cyanocobalamin (VITAMIN B-12 PO) Take 2 tablets by mouth daily.    [provider]  cyclobenzaprine (FLEXERIL) 5 MG tablet Take 1 tablet (5 mg total) by mouth at bedtime for 10 doses. 03/03/21 03/12/21  Curatolo, Adam, DO  diphenhydramine-acetaminophen (TYLENOL PM) 25-500 MG TABS tablet Take 2 tablets by mouth at bedtime.    [provider]  fluticasone (FLONASE) 50 MCG/ACT nasal spray Place 1 spray  into both nostrils daily as needed for allergies or rhinitis.  06/18/14   [provider]  Melatonin 5 MG CAPS Take 5 mg by mouth at bedtime.    [provider]  metFORMIN (GLUCOPHAGE) 500 MG tablet Take 500 mg by mouth daily. 08/23/19   [provider]  Multiple Vitamins-Minerals (ZINC PO) Take 1 tablet by mouth daily.    [provider]  pantoprazole (PROTONIX) 40 MG tablet Take 1 tablet (40 mg total) by mouth daily with breakfast. 02/04/21   Esterwood, Amy S, PA-C  polyvinyl alcohol (LIQUIFILM TEARS) 1.4 % ophthalmic solution Place 1 drop into both eyes as needed for dry eyes.    [provider]  predniSONE (DELTASONE) 10 MG tablet Take 5 mg by mouth daily.  07/11/19   [provider]  rOPINIRole (REQUIP) 3 MG tablet Take 1 tablet (3 mg total) by mouth at bedtime. 12/09/20   Frann Rider, NP  simvastatin (ZOCOR) 40 MG tablet Take 1 tablet (40 mg total) by mouth daily. 02/26/16   Amin, Jeanella Flattery, MD  traMADol (ULTRAM) 50 MG tablet Take 1 tablet (50 mg total) by mouth daily as needed for moderate pain. 01/22/20   Lavina Hamman, MD  vitamin C (ASCORBIC ACID) 500 MG tablet Take 500 mg by mouth daily.    [provider]  zonisamide (ZONEGRAN) 100 MG capsule Take 1 capsule (100 mg total) by mouth daily. Patient not taking: Reported on 02/28/2021 12/09/20  Frann Rider, NP      Allergies    Tape    Review of Systems   Review of Systems  Physical Exam Updated Vital Signs There were no vitals taken for this visit. Physical Exam  ED Results / Procedures / Treatments   Labs (all labs ordered are listed, but only abnormal results are displayed) Labs Reviewed - No data to display  EKG None  Radiology No results found.  Procedures Procedures    Medications Ordered in ED Medications - No data to display  ED Course/ Medical Decision Making/ A&P Clinical Course as of 03/09/21 2139  Sat Mar 08, 2021  2043 I-stat chem 8,  ED(!) [AH]  2043 Glucose(!): 107 [AH]  2043 Resp Panel by RT-PCR (Flu A&B, Covid) Nasopharyngeal Swab [AH]  2043 Comprehensive metabolic panel(!) [AH]  3810 APTT [AH]  2043 Protime-INR [AH]  2043 Differential(!) [AH]  2043 CBC(!) [AH]  2044 Urinalysis, Routine w reflex microscopic Urine, Clean Catch(!) Labs reviewed- no sig. Abnormalities  [AH]    Clinical Course User Index [AH] Margarita Mail, PA-C                           Medical Decision Making Patient here with TIA symptoms DDX includes dysarthria, presyncope, stroke, metabolic encephalopathy, recrudescence. After reviewing all data points patient will need admission for TIA.  Case discussed with Dr. Leonel Ramsay of neurology who has consulted on the patient and agrees with work-up and plan for admission. Also discussed with Dr. Posey Pronto of [facility] who admit the patient.  Regarding the patient's back pain he appears to have a T12 compression fracture.  TLSO ordered  Amount and/or Complexity of Data Reviewed Independent Historian: spouse Labs: ordered. Decision-making details documented in ED Course. Radiology: ordered and independent interpretation performed. Decision-making details documented in ED Course. ECG/medicine tests: ordered and independent interpretation performed. Decision-making details documented in ED Course.  Risk Decision regarding hospitalization.    Final Clinical Impression(s) / ED Diagnoses Final diagnoses:  None    Rx / DC Orders ED Discharge Orders     None         Margarita Mail, PA-C 03/09/21 2142    Gareth Morgan, MD 03/11/21 [phone]

## 2021-03-08 NOTE — ED Notes (Signed)
RN called orthopedic technician for application of TLSO brace

## 2021-03-08 NOTE — ED Triage Notes (Signed)
Pt bib EMS from home, assisted living facility. Around 1730 he began having difficulty speaking. Hx stroke affecting left side. No new stroke symptoms at this time on arrival. Recently was seen at [facility] with back pain. Also c/o abdominal pain r/t no bowel movements for the past several days. Pt is A&O x4.  EMS vitals: 156/90 HR 80 RR 16 99% RA 142 CBG  20G IV in L forearm

## 2021-03-08 NOTE — H&P (Signed)
History and Physical    [name]:[id number] DOB: 06-Apr-1931 DOA: 03/08/2021  PCP: Seward Carol, MD  Patient coming from: Home via EMS  I have personally briefly reviewed patient's old medical records in [place]  Chief Complaint: Abnormal speech  HPI: FOCH ROSENWALD is a 88 y.o. male with medical history significant for history of CVA, recurrent GI bleed, paroxysmal atrial fibrillation not on anticoagulation, esophageal stenosis, chronic systolic CHF (last EF 81% 02/2016), T2DM, HLD, asthma, chronic prednisone use (on 5 mg daily for asthma and chronic bronchitis) who presented to the ED for evaluation of abnormal speech.  History is supplemented by patient's spouse and daughter at bedside.  Patient states that around 1730 03/08/2021 he was speaking to his wife when he suddenly developed slurred speech and difficulty with word finding.  He says he did understand his wife when she spoke to him but could not get the right words out.  His wife noticed that he had some left-sided facial drooping.  He had some nausea today without emesis.  He otherwise denies any headache, change in vision, chest pain, palpitations, dyspnea, abdominal pain, dysuria, focal weakness, numbness/tingling.  Patient has a history of recurrent GI bleeding.  He takes aspirin 81 mg daily and states that he has not tolerated dual antiplatelet therapy in the past due to bleeding.  Patient has chronic back pain for which he takes tramadol.  He states that he was recently prescribed Norco due to uncontrolled pain.  His family says that he did not tolerate the Norco as he started acting "berserk" after taking it yesterday.  He has not taken it day of his presenting symptoms.  ED Course:  Initial vitals showed BP 166/97, pulse 82, RR 22, temp 98.2 F, SPO2 99% on room air.  Labs show WBC 8.9, hemoglobin 11.2, platelets 491,000, sodium 136, potassium 4.2, bicarb 24, BUN 10, creatinine 0.78, serum glucose 108, LFTs  within normal limits, serum ethanol undetectable, urinalysis negative for UTI.  SARS-CoV-2 and influenza PCR negative.  CT head without contrast negative for acute intracranial abnormality.  Atrophy and chronic small vessel ischemic changes of white matter seen.  Ventricles slightly more prominent compared to prior head CT.  CT cervical spine without contrast shows straightening of the C-spine with degenerative changes, no acute osseous abnormality.  CT thoracic spine shows slight compression fracture at T12, no retropulsed fracture fragments.  CT lumbar spine negative for acute bony abnormality.  Convex rightward scoliosis and moderate to advanced degenerative disc and facet disease noted.  CT chest/abdomen/pelvis negative for acute findings.  Moderate sized hiatal hernia, CAD, aortic atherosclerosis and colonic diverticulosis noted.  TLSO brace was ordered.  Neurology were consulted and the hospitalist service was consulted to admit for further evaluation and management.  Review of Systems:  All systems reviewed and are negative except as documented in history of present illness above.   Past Medical History:  Diagnosis Date   Acute CVA (cerebrovascular accident) ([place]) 07/25/2015   Asthma    Benign essential HTN    Cerebral infarction due to embolism of left middle cerebral artery ([place]) 02/03/2014   Chronic combined systolic and diastolic CHF (congestive heart failure) ([place]) 12/23/2017   Colitis    Colon polyps    Diabetes mellitus type 2, diet-controlled ([place]) 12/22/2013   Diverticulosis    GERD (gastroesophageal reflux disease)    GI bleed    GI bleed    Hemorrhoids    Hiatal hernia    History  of esophageal strciture    HLD (hyperlipidemia)    Osteoarthritis    Proctitis    Status post dilation of esophageal narrowing    Stroke [facility])     Past Surgical History:  Procedure Laterality Date   ABDOMINAL HERNIA REPAIR     BIOPSY  04/05/2019   Procedure: BIOPSY;  Surgeon:  Thornton Willner, MD;  Location: [facility] ENDOSCOPY;  Service: Gastroenterology;;   COLONOSCOPY     multiple   COLONOSCOPY WITH PROPOFOL N/A 12/12/2016   Procedure: COLONOSCOPY WITH PROPOFOL;  Surgeon: Gatha Mayer, MD;  Location: [place];  Service: Endoscopy;  Laterality: N/A;   COLONOSCOPY WITH PROPOFOL N/A 04/04/2019   Procedure: COLONOSCOPY WITH PROPOFOL;  Surgeon: Milus Banister, MD;  Location: [facility] ENDOSCOPY;  Service: Endoscopy;  Laterality: N/A;   ENTEROSCOPY N/A 01/16/2020   Procedure: ENTEROSCOPY;  Surgeon: Rush Landmark Telford Nab., MD;  Location: [facility] ENDOSCOPY;  Service: Gastroenterology;  Laterality: N/A;   ENTEROSCOPY N/A 01/20/2020   Procedure: ENTEROSCOPY;  Surgeon: Rush Landmark Telford Nab., MD;  Location: [facility] ENDOSCOPY;  Service: Gastroenterology;  Laterality: N/A;   ENTEROSCOPY N/A 09/16/2020   Procedure: ENTEROSCOPY;  Surgeon: Ladene Artist, MD;  Location: [facility] ENDOSCOPY;  Service: Endoscopy;  Laterality: N/A;  push enteroscopy   ESOPHAGOGASTRODUODENOSCOPY     multiple   ESOPHAGOGASTRODUODENOSCOPY (EGD) WITH PROPOFOL N/A 04/05/2019   Procedure: ESOPHAGOGASTRODUODENOSCOPY (EGD) WITH PROPOFOL;  Surgeon: Thornton Spicher, MD;  Location: [place];  Service: Gastroenterology;  Laterality: N/A;   ESOPHAGOGASTRODUODENOSCOPY (EGD) WITH PROPOFOL N/A 01/18/2020   Procedure: ESOPHAGOGASTRODUODENOSCOPY (EGD) WITH PROPOFOL;  Surgeon: Doran Stabler, MD;  Location: [facility] ENDOSCOPY;  Service: Gastroenterology;  Laterality: N/A;  For placement of video capsule study   GIVENS CAPSULE STUDY N/A 01/16/2020   Procedure: [place];  Surgeon: Irving Copas., MD;  Location: [facility] ENDOSCOPY;  Service: Gastroenterology;  Laterality: N/A;   GIVENS CAPSULE STUDY N/A 01/18/2020   Procedure: GIVENS CAPSULE STUDY;  Surgeon: Doran Stabler, MD;  Location: [facility] ENDOSCOPY;  Service: Gastroenterology;  Laterality: N/A;   HOT HEMOSTASIS N/A 01/20/2020   Procedure: HOT HEMOSTASIS (ARGON PLASMA  COAGULATION/BICAP);  Surgeon: Irving Copas., MD;  Location: [facility] ENDOSCOPY;  Service: Gastroenterology;  Laterality: N/A;   HOT HEMOSTASIS N/A 09/16/2020   Procedure: HOT HEMOSTASIS (ARGON PLASMA COAGULATION/BICAP);  Surgeon: Ladene Artist, MD;  Location: [facility] ENDOSCOPY;  Service: Endoscopy;  Laterality: N/A;   INSERTION OF MESH  01/29/2012   Procedure: INSERTION OF MESH;  Surgeon: Gwenyth Ober, MD;  Location: [place];  Service: General;  Laterality: N/A;   IR ANGIOGRAM SELECTIVE EACH ADDITIONAL VESSEL  04/01/2019   IR ANGIOGRAM VISCERAL SELECTIVE  04/01/2019   IR ANGIOGRAM VISCERAL SELECTIVE  04/01/2019   IR ANGIOGRAM VISCERAL SELECTIVE  04/01/2019   IR US GUIDE VASC ACCESS RIGHT  0/92/3300   NISSEN FUNDOPLICATION     SAVORY DILATION N/A 01/16/2020   Procedure: SAVORY DILATION;  Surgeon: Irving Copas., MD;  Location: [facility] ENDOSCOPY;  Service: Gastroenterology;  Laterality: N/A;   SAVORY DILATION N/A 09/16/2020   Procedure: SAVORY DILATION;  Surgeon: Ladene Artist, MD;  Location: [facility] ENDOSCOPY;  Service: Endoscopy;  Laterality: N/A;   SCLEROTHERAPY  01/20/2020   Procedure: SCLEROTHERAPY;  Surgeon: Mansouraty, Telford Nab., MD;  Location: [facility] ENDOSCOPY;  Service: Gastroenterology;;   SPLENECTOMY, TOTAL     nontraumatic rupture   SUBMUCOSAL TATTOO INJECTION  01/16/2020   Procedure: SUBMUCOSAL TATTOO INJECTION;  Surgeon: Irving Copas., MD;  Location: [facility] ENDOSCOPY;  Service: Gastroenterology;;   VENTRAL HERNIA REPAIR  01/29/2012    WITH MESH   VENTRAL HERNIA REPAIR  01/29/2012   Procedure: HERNIA REPAIR VENTRAL ADULT;  Surgeon: Gwenyth Ober, MD;  Location: [place];  Service: General;  Laterality: N/A;  open recurrent ventral hernia repair with mesh    Social History:  reports that he has never smoked. He has never used smokeless tobacco. He reports current alcohol use of about 2.0 standard drinks per week. He reports that he does not use drugs.  Allergies  Allergen  Reactions   Tape Other (See Comments)    SKIN IS SENSITIVE; PLEASE USE PAPER TAPE; SKIN BRUISES AND TEARS EASILY!!    Family History  Problem Relation Age of Onset   Alzheimer's disease Mother    Breast cancer Mother    Throat cancer Father    Colon cancer Neg Hx    Colon polyps Neg Hx    Pancreatic cancer Neg Hx    Stomach cancer Neg Hx    Liver disease Neg Hx    Esophageal cancer Neg Hx    Rectal cancer Neg Hx      Prior to Admission medications   Medication Sig Start Date End Date Taking? Authorizing Provider  acetaminophen (TYLENOL) 500 MG tablet Take 1,000 mg by mouth every 6 (six) hours as needed for moderate pain or headache.   Yes [provider]  albuterol (VENTOLIN HFA) 108 (90 Base) MCG/ACT inhaler Inhale 2 puffs into the lungs every 6 (six) hours as needed for wheezing or shortness of breath.   Yes [provider]  aspirin EC 81 MG tablet Take 1 tablet (81 mg total) by mouth daily. Swallow whole. 08/20/20 08/20/21 Yes Rai, Ripudeep K, MD  budesonide (PULMICORT) 0.5 MG/2ML nebulizer solution Take 2 mLs by nebulization daily. 77ml by nebulization daily And 58ml by nebulization daily as needed for shortness of breath/wheezing 09/01/18  Yes [provider]  cholecalciferol (VITAMIN D) 25 MCG (1000 UNIT) tablet Take 1,000 Units by mouth daily.   Yes [provider]  Cyanocobalamin (VITAMIN B-12 PO) Take 1 tablet by mouth daily.   Yes [provider]  diphenhydramine-acetaminophen (TYLENOL PM) 25-500 MG TABS tablet Take 2 tablets by mouth at bedtime.   Yes [provider]  fluticasone (FLONASE) 50 MCG/ACT nasal spray Place 1 spray into both nostrils daily as needed for allergies or rhinitis.  06/18/14  Yes [provider]  ibuprofen (ADVIL) 200 MG tablet Take 200 mg by mouth every 6 (six) hours as needed for headache or moderate pain.   Yes [provider]  Melatonin 5 MG CAPS Take 5 mg by mouth at bedtime.   Yes  [provider]  metFORMIN (GLUCOPHAGE) 500 MG tablet Take 500 mg by mouth daily. 08/23/19  Yes [provider]  Multiple Vitamins-Minerals (ZINC PO) Take 1 tablet by mouth daily.   Yes [provider]  pantoprazole (PROTONIX) 40 MG tablet Take 1 tablet (40 mg total) by mouth daily with breakfast. 02/04/21  Yes Esterwood, Amy S, PA-C  polyvinyl alcohol (LIQUIFILM TEARS) 1.4 % ophthalmic solution Place 1 drop into both eyes as needed for dry eyes.   Yes [provider]  predniSONE (DELTASONE) 10 MG tablet Take 5 mg by mouth daily.  07/11/19  Yes [provider]  rOPINIRole (REQUIP) 3 MG tablet Take 1 tablet (3 mg total) by mouth at bedtime. 12/09/20  Yes McCue, Janett Billow, NP  simvastatin (ZOCOR) 40 MG tablet Take 1  tablet (40 mg total) by mouth daily. Patient taking differently: Take 40 mg by mouth at bedtime. 02/26/16  Yes Amin, Jeanella Flattery, MD  traMADol (ULTRAM) 50 MG tablet Take 1 tablet (50 mg total) by mouth daily as needed for moderate pain. 01/22/20  Yes Lavina Hamman, MD  vitamin C (ASCORBIC ACID) 500 MG tablet Take 500 mg by mouth daily.   Yes [provider]  cyclobenzaprine (FLEXERIL) 5 MG tablet Take 1 tablet (5 mg total) by mouth at bedtime for 10 doses. Patient not taking: Reported on 03/08/2021 03/03/21 03/12/21  Lennice Sites, DO  HYDROcodone-acetaminophen (NORCO/VICODIN) 5-325 MG tablet Take 1 tablet by mouth daily as needed. Patient not taking: Reported on 03/08/2021 03/07/21   [provider]  zonisamide (ZONEGRAN) 100 MG capsule Take 1 capsule (100 mg total) by mouth daily. Patient not taking: Reported on 02/28/2021 12/09/20   Frann Rider, NP    Physical Exam: Vitals:   03/08/21 2145 03/08/21 2300 03/08/21 2315 03/09/21 0015  BP: (!) 144/98 (!) 144/95 (!) 164/80 (!) 136/97  Pulse: 80 88 96 86  Resp: 20 19 18 16   Temp:      TempSrc:      SpO2: 98% 98% 99% 94%  Weight:      Height:       Constitutional: Resting  supine in bed, NAD, calm, comfortable Eyes: PERRL, EOMI, lids and conjunctivae normal ENMT: Mucous membranes are moist. Posterior pharynx clear of any exudate or lesions.Normal dentition.  Neck: normal, supple, no masses. Respiratory: clear to auscultation bilaterally, no wheezing, no crackles. Normal respiratory effort. No accessory muscle use.  Cardiovascular: Regular rate and rhythm, no murmurs / rubs / gallops. No extremity edema. 2+ pedal pulses. Abdomen: no tenderness, no masses palpated. No hepatosplenomegaly. Musculoskeletal: no clubbing / cyanosis. No joint deformity upper and lower extremities. Good ROM, no contractures. Normal muscle tone.  Skin: no rashes, lesions, ulcers. No induration Neurologic: Slightly dysarthric speech, and baseline per patient otherwise CN 2-12 grossly intact. Sensation intact. Strength 5/5 in all 4.  Psychiatric: Normal judgment and insight. Alert and oriented x 3. Normal mood.   Labs on Admission: I have personally reviewed following labs and imaging studies  CBC: Recent Labs  Lab 03/08/21 1930 03/08/21 2003  WBC 8.9  --   NEUTROABS 5.2  --   HGB 11.2* 13.3  HCT 36.7* 39.0  MCV 87.8  --   PLT 491*  --    Basic Metabolic Panel: Recent Labs  Lab 03/08/21 1930 03/08/21 2003  NA 136 136  K 4.2 4.1  CL 102 100  CO2 24  --   GLUCOSE 108* 107*  BUN 10 9  CREATININE 0.78 0.70  CALCIUM 8.8*  --    GFR: Estimated Creatinine Clearance: 58.7 mL/min (by C-G formula based on SCr of 0.7 mg/dL). Liver Function Tests: Recent Labs  Lab 03/08/21 1930  AST 18  ALT 11  ALKPHOS 71  BILITOT 0.4  PROT 6.4*  ALBUMIN 3.7   No results for input(s): LIPASE, AMYLASE in the last 168 hours. No results for input(s): AMMONIA in the last 168 hours. Coagulation Profile: Recent Labs  Lab 03/08/21 1930  INR 1.0   Cardiac Enzymes: No results for input(s): CKTOTAL, CKMB, CKMBINDEX, TROPONINI in the last 168 hours. BNP (last 3 results) No results for  input(s): PROBNP in the last 8760 hours. HbA1C: No results for input(s): HGBA1C in the last 72 hours. CBG: No results for input(s): GLUCAP in the last 168 hours.  Lipid Profile: No results for input(s): CHOL, HDL, LDLCALC, TRIG, CHOLHDL, LDLDIRECT in the last 72 hours. Thyroid Function Tests: No results for input(s): TSH, T4TOTAL, FREET4, T3FREE, THYROIDAB in the last 72 hours. Anemia Panel: No results for input(s): VITAMINB12, FOLATE, FERRITIN, TIBC, IRON, RETICCTPCT in the last 72 hours. Urine analysis:    Component Value Date/Time   COLORURINE STRAW (A) 03/08/2021 1919   APPEARANCEUR CLEAR 03/08/2021 1919   LABSPEC 1.006 03/08/2021 1919   PHURINE 6.0 03/08/2021 1919   GLUCOSEU NEGATIVE 03/08/2021 1919   HGBUR NEGATIVE 03/08/2021 [place] NEGATIVE 03/08/2021 1919   KETONESUR NEGATIVE 03/08/2021 1919   PROTEINUR NEGATIVE 03/08/2021 1919   UROBILINOGEN 0.2 12/22/2013 1210   NITRITE NEGATIVE 03/08/2021 1919   LEUKOCYTESUR NEGATIVE 03/08/2021 1919    Radiological Exams on Admission: CT HEAD WO CONTRAST  Result Date: 03/08/2021 CLINICAL DATA:  Fall on 01/16, pain EXAM: CT HEAD WITHOUT CONTRAST CT CERVICAL SPINE WITHOUT CONTRAST TECHNIQUE: Multidetector CT imaging of the head and cervical spine was performed following the standard protocol without intravenous contrast. Multiplanar CT image reconstructions of the cervical spine were also generated. RADIATION DOSE REDUCTION: This exam was performed according to the departmental dose-optimization program which includes automated exposure control, adjustment of the mA and/or kV according to patient size and/or use of iterative reconstruction technique. COMPARISON:  CT brain 04/01/2019, CT cervical spine 12/23/2017 FINDINGS: CT HEAD FINDINGS Brain: No acute territorial infarction, hemorrhage, or intracranial mass. Moderate atrophy. Moderate chronic small vessel ischemic changes of the white matter. Small chronic infarct in the right  thalamic capsular region. Ventricle are stable to minimally enlarged compared to prior. Vascular: No hyperdense vessels.  Carotid vascular calcification Skull: Normal. Negative for fracture or focal lesion. Sinuses/Orbits: Mucosal thickening in the ethmoid maxillary and frontal sinuses Other: None CT CERVICAL SPINE FINDINGS Alignment: Straightening of the cervical spine. 3 mm anterolisthesis C4 on C5. Facet alignment within normal limits. Skull base and vertebrae: No acute fracture. No primary bone lesion or focal pathologic process. Soft tissues and spinal canal: No prevertebral fluid or swelling. No visible canal hematoma. Disc levels: Degenerative changes at multiple levels. Partial ankylosis C5-C6. Marked disc space narrowing at C6-C7. Facet degenerative changes at multiple levels with foraminal stenosis. Upper chest: Negative. Other: None IMPRESSION: 1. No CT evidence for acute intracranial abnormality. Atrophy and chronic small vessel ischemic changes of the white matter. Ventricles slightly more prominent compared to prior head CT potentially due to slight atrophy progression. 2. Straightening of the cervical spine with degenerative changes. No acute osseous abnormality is seen. Electronically Signed   By: Donavan Foil M.D.   On: 03/08/2021 21:29   CT CERVICAL SPINE WO CONTRAST  Result Date: 03/08/2021 CLINICAL DATA:  Fall on 01/16, pain EXAM: CT HEAD WITHOUT CONTRAST CT CERVICAL SPINE WITHOUT CONTRAST TECHNIQUE: Multidetector CT imaging of the head and cervical spine was performed following the standard protocol without intravenous contrast. Multiplanar CT image reconstructions of the cervical spine were also generated. RADIATION DOSE REDUCTION: This exam was performed according to the departmental dose-optimization program which includes automated exposure control, adjustment of the mA and/or kV according to patient size and/or use of iterative reconstruction technique. COMPARISON:  CT brain  04/01/2019, CT cervical spine 12/23/2017 FINDINGS: CT HEAD FINDINGS Brain: No acute territorial infarction, hemorrhage, or intracranial mass. Moderate atrophy. Moderate chronic small vessel ischemic changes of the white matter. Small chronic infarct in the right thalamic capsular region. Ventricle are stable to minimally enlarged compared to prior. Vascular: No  hyperdense vessels.  Carotid vascular calcification Skull: Normal. Negative for fracture or focal lesion. Sinuses/Orbits: Mucosal thickening in the ethmoid maxillary and frontal sinuses Other: None CT CERVICAL SPINE FINDINGS Alignment: Straightening of the cervical spine. 3 mm anterolisthesis C4 on C5. Facet alignment within normal limits. Skull base and vertebrae: No acute fracture. No primary bone lesion or focal pathologic process. Soft tissues and spinal canal: No prevertebral fluid or swelling. No visible canal hematoma. Disc levels: Degenerative changes at multiple levels. Partial ankylosis C5-C6. Marked disc space narrowing at C6-C7. Facet degenerative changes at multiple levels with foraminal stenosis. Upper chest: Negative. Other: None IMPRESSION: 1. No CT evidence for acute intracranial abnormality. Atrophy and chronic small vessel ischemic changes of the white matter. Ventricles slightly more prominent compared to prior head CT potentially due to slight atrophy progression. 2. Straightening of the cervical spine with degenerative changes. No acute osseous abnormality is seen. Electronically Signed   By: Donavan Foil M.D.   On: 03/08/2021 21:29   CT T-SPINE NO CHARGE  Result Date: 03/08/2021 CLINICAL DATA:  Low back pain EXAM: CT THORACIC AND LUMBAR SPINE WITHOUT CONTRAST TECHNIQUE: Multidetector CT imaging of the thoracic and lumbar spine was performed without contrast. Multiplanar CT image reconstructions were also generated. RADIATION DOSE REDUCTION: This exam was performed according to the departmental dose-optimization program which  includes automated exposure control, adjustment of the mA and/or kV according to patient size and/or use of iterative reconstruction technique. COMPARISON:  None. FINDINGS: CT THORACIC SPINE FINDINGS Alignment: Normal Vertebrae: Slight compression deformity at T12 compatible with slight compression fracture. No additional acute bony abnormality or focal bone lesion. Paraspinal and other soft tissues: Normal Disc levels: Early disc space narrowing and spurring. CT LUMBAR SPINE FINDINGS Segmentation: 5 lumbar type vertebrae. Alignment: No subluxation. Convex rightward scoliosis centered in the upper to mid lumbar spine. Vertebrae: No fracture or focal bone lesion. Paraspinal and other soft tissues: Normal Disc levels: Advanced diffuse degenerative disc disease and facet disease. IMPRESSION: CT THORACIC SPINE IMPRESSION Slight compression fracture at T12. No retropulsed fracture fragments. CT LUMBAR SPINE IMPRESSION No acute bony abnormality. Convex rightward scoliosis. Moderate to advanced degenerative disc and facet disease. Electronically Signed   By: Rolm Baptise M.D.   On: 03/08/2021 21:25   CT L-SPINE NO CHARGE  Result Date: 03/08/2021 CLINICAL DATA:  Low back pain EXAM: CT THORACIC AND LUMBAR SPINE WITHOUT CONTRAST TECHNIQUE: Multidetector CT imaging of the thoracic and lumbar spine was performed without contrast. Multiplanar CT image reconstructions were also generated. RADIATION DOSE REDUCTION: This exam was performed according to the departmental dose-optimization program which includes automated exposure control, adjustment of the mA and/or kV according to patient size and/or use of iterative reconstruction technique. COMPARISON:  None. FINDINGS: CT THORACIC SPINE FINDINGS Alignment: Normal Vertebrae: Slight compression deformity at T12 compatible with slight compression fracture. No additional acute bony abnormality or focal bone lesion. Paraspinal and other soft tissues: Normal Disc levels: Early disc  space narrowing and spurring. CT LUMBAR SPINE FINDINGS Segmentation: 5 lumbar type vertebrae. Alignment: No subluxation. Convex rightward scoliosis centered in the upper to mid lumbar spine. Vertebrae: No fracture or focal bone lesion. Paraspinal and other soft tissues: Normal Disc levels: Advanced diffuse degenerative disc disease and facet disease. IMPRESSION: CT THORACIC SPINE IMPRESSION Slight compression fracture at T12. No retropulsed fracture fragments. CT LUMBAR SPINE IMPRESSION No acute bony abnormality. Convex rightward scoliosis. Moderate to advanced degenerative disc and facet disease. Electronically Signed   By: Rolm Baptise M.D.  On: 03/08/2021 21:25   CT CHEST ABDOMEN PELVIS WO CONTRAST  Result Date: 03/08/2021 CLINICAL DATA:  Fall on 03/03/2021.  Low back pain.  Abdominal pain. EXAM: CT CHEST, ABDOMEN AND PELVIS WITHOUT CONTRAST TECHNIQUE: Multidetector CT imaging of the chest, abdomen and pelvis was performed following the standard protocol without IV contrast. RADIATION DOSE REDUCTION: This exam was performed according to the departmental dose-optimization program which includes automated exposure control, adjustment of the mA and/or kV according to patient size and/or use of iterative reconstruction technique. COMPARISON:  08/18/2020 FINDINGS: CT CHEST FINDINGS Cardiovascular: Heart is normal size. Aorta is normal caliber. Scattered aortic and coronary artery calcifications. Mediastinum/Nodes: No mediastinal, hilar, or axillary adenopathy. Trachea and esophagus are unremarkable. Thyroid unremarkable. Moderate-sized hiatal hernia. Lungs/Pleura: Lungs are clear. No focal airspace opacities or suspicious nodules. No effusions. Musculoskeletal: No acute bony abnormality. CT ABDOMEN PELVIS FINDINGS Hepatobiliary: No focal hepatic abnormality. Gallbladder unremarkable. Pancreas: No focal abnormality or ductal dilatation. Spleen: Prior splenectomy. Adrenals/Urinary Tract: No adrenal abnormality. No  focal renal abnormality. No stones or hydronephrosis. Urinary bladder is unremarkable. Stomach/Bowel: Colonic diverticulosis. No active diverticulitis. No bowel obstruction. Stomach and small bowel grossly unremarkable. Vascular/Lymphatic: Aortic atherosclerosis. No evidence of aneurysm or adenopathy. Reproductive: Mildly prominent prostate with central calcifications. Other: No free fluid or free air. Small left inguinal hernia containing fat. Musculoskeletal: Rightward scoliosis in the lumbar spine. No acute bony abnormality. IMPRESSION: No acute findings in the chest, abdomen or pelvis. Moderate-sized hiatal hernia. Coronary artery disease, aortic atherosclerosis. Colonic diverticulosis. Electronically Signed   By: Rolm Baptise M.D.   On: 03/08/2021 21:20    EKG: Personally reviewed. Normal sinus rhythm with PVC.  Tachycardia no longer present when compared to prior.  Assessment/Plan Principal Problem:   Expressive aphasia Active Problems:   PAF (paroxysmal atrial fibrillation) (HCC)   Chronic combined systolic and diastolic CHF (congestive heart failure) (HCC)   Diabetes mellitus type II, non insulin dependent (HCC)   HLD (hyperlipidemia)   History of esophageal strciture   Cough variant asthma   T12 compression fracture, initial encounter ([place])   ESMOND HINCH is a 88 y.o. male with medical history significant for history of CVA, recurrent GI bleed, paroxysmal atrial fibrillation not on anticoagulation, esophageal stenosis, chronic systolic CHF (last EF 02% 02/2016), T2DM, HLD, asthma, chronic prednisone use (on 5 mg daily for asthma and chronic bronchitis) who is admitted for TIA work-up  * Expressive aphasia- (present on admission) History of CVA Patient with transient expressive aphasia lasting approximately 15 minutes.  History suggestive of TIA.  Patient admitted for further work-up. -Neurology following -CT head negative for acute intracranial abnormality -Obtain MRI brain -Obtain  CTA head/neck -Continue aspirin 81 mg daily, unlikely be candidate for further aggressive antiplatelet therapy given recurrent GI bleeding issues -Obtain echocardiogram -Continue telemetry, neurochecks -PT/OT/SLP eval -Continue simvastatin  PAF (paroxysmal atrial fibrillation) ([place])- (present on admission) Remains in sinus rhythm on admission.  Not on anticoagulation due to recurrent GI bleeding issues.  Continue aspirin 81 mg daily.  Not requiring rate or rhythm controlling medications as an outpatient.  Chronic combined systolic and diastolic CHF (congestive heart failure) ([place])- (present on admission) Compensated and euvolemic on admission.  Last EF 45% January 2018.  Not requiring diuretic as an outpatient.  Diabetes mellitus type II, non insulin dependent (HCC) Hold metformin, check A1c.  HLD (hyperlipidemia)- (present on admission) Continue simvastatin.  History of esophageal strciture Follows with [facility], Dr. Ardis Hughs.  EGD attempted 02/28/2021 had to be aborted due  to inability to pass through tortuous stenotic esophagus.  Follow SLP eval.  Cough variant asthma- (present on admission) Stable.  Continue Pulmicort, albuterol as needed, and chronic prednisone 5 mg daily.  T12 compression fracture, initial encounter ([place])- (present on admission) Chronic back pain due to T12 compression fracture, mild by CT imaging.  Continue tramadol as needed.  TLSO brace placed in ED.   DVT prophylaxis: SCD's Start: 03/09/21 0025 Code Status: Full code, confirmed with patient on admission Family Communication: Discussed with patient spouse and daughter at bedside Disposition Plan: From ALF, dispo pending clinical progress Consults called: Neurology Level of care: Telemetry Medical Admission status:   Status is: Observation  The patient remains OBS appropriate and will d/c before 2 midnights.   Zada Finders MD [facility]  If 7PM-7AM, please contact  night-coverage [email or web address]  03/09/2021, 1:44 AM

## 2021-03-09 ENCOUNTER — Observation Stay (HOSPITAL_COMMUNITY): Payer: Medicare Other

## 2021-03-09 ENCOUNTER — Other Ambulatory Visit (HOSPITAL_COMMUNITY): Payer: Medicare Other

## 2021-03-09 DIAGNOSIS — E785 Hyperlipidemia, unspecified: Secondary | ICD-10-CM

## 2021-03-09 DIAGNOSIS — G43109 Migraine with aura, not intractable, without status migrainosus: Secondary | ICD-10-CM | POA: Diagnosis not present

## 2021-03-09 DIAGNOSIS — R4701 Aphasia: Secondary | ICD-10-CM

## 2021-03-09 DIAGNOSIS — G459 Transient cerebral ischemic attack, unspecified: Principal | ICD-10-CM

## 2021-03-09 DIAGNOSIS — R519 Headache, unspecified: Secondary | ICD-10-CM | POA: Diagnosis not present

## 2021-03-09 DIAGNOSIS — I5042 Chronic combined systolic (congestive) and diastolic (congestive) heart failure: Secondary | ICD-10-CM | POA: Diagnosis not present

## 2021-03-09 DIAGNOSIS — I6523 Occlusion and stenosis of bilateral carotid arteries: Secondary | ICD-10-CM | POA: Diagnosis not present

## 2021-03-09 DIAGNOSIS — J3489 Other specified disorders of nose and nasal sinuses: Secondary | ICD-10-CM | POA: Diagnosis not present

## 2021-03-09 LAB — BASIC METABOLIC PANEL
Anion gap: 7 (ref 5–15)
BUN: 6 mg/dL — ABNORMAL LOW (ref 8–23)
CO2: 25 mmol/L (ref 22–32)
Calcium: 8.4 mg/dL — ABNORMAL LOW (ref 8.9–10.3)
Chloride: 102 mmol/L (ref 98–111)
Creatinine, Ser: 0.65 mg/dL (ref 0.61–1.24)
GFR, Estimated: >60 mL/min (ref >60)
Glucose, Bld: 129 mg/dL — ABNORMAL HIGH (ref 70–99)
Potassium: 3.6 mmol/L (ref 3.5–5.1)
Sodium: 134 mmol/L — ABNORMAL LOW (ref 135–145)

## 2021-03-09 LAB — LIPID PANEL
Cholesterol: 111 mg/dL (ref 0–200)
HDL: 50 mg/dL (ref >40)
LDL Cholesterol: 43 mg/dL (ref 0–99)
Total CHOL/HDL Ratio: 2.2 RATIO
Triglycerides: 88 mg/dL (ref <150)
VLDL: 18 mg/dL (ref 0–40)

## 2021-03-09 LAB — CBC
HCT: 34.9 % — ABNORMAL LOW (ref 39.0–52.0)
Hemoglobin: 10.6 g/dL — ABNORMAL LOW (ref 13.0–17.0)
MCH: 26.8 pg (ref 26.0–34.0)
MCHC: 30.4 g/dL (ref 30.0–36.0)
MCV: 88.1 fL (ref 80.0–100.0)
Platelets: 467 10*3/uL — ABNORMAL HIGH (ref 150–400)
RBC: 3.96 MIL/uL — ABNORMAL LOW (ref 4.22–5.81)
RDW: 19 % — ABNORMAL HIGH (ref 11.5–15.5)
WBC: 10.4 10*3/uL (ref 4.0–10.5)
nRBC: 0 % (ref 0.0–0.2)

## 2021-03-09 MED ORDER — SENNOSIDES-DOCUSATE SODIUM 8.6-50 MG PO TABS: 1.0000 | ORAL_TABLET | Freq: Every evening | ORAL | Status: DC | PRN

## 2021-03-09 MED ORDER — PANTOPRAZOLE SODIUM 40 MG PO TBEC
40.0000 mg | DELAYED_RELEASE_TABLET | Freq: Every day | ORAL | Status: DC
  Administered 2021-03-10 – 2021-03-12 (×3): 40 mg via ORAL
  Filled 2021-03-09 (×4): qty 1

## 2021-03-09 MED ORDER — SIMVASTATIN 20 MG PO TABS
40.0000 mg | ORAL_TABLET | Freq: Every day | ORAL | Status: DC
  Administered 2021-03-09 – 2021-03-11 (×4): 40 mg via ORAL
  Filled 2021-03-09 (×4): qty 2

## 2021-03-09 MED ORDER — ALBUTEROL SULFATE (2.5 MG/3ML) 0.083% IN NEBU: 3.0000 mL | INHALATION_SOLUTION | Freq: Four times a day (QID) | RESPIRATORY_TRACT | Status: DC | PRN

## 2021-03-09 MED ORDER — ONDANSETRON HCL 4 MG/2ML IJ SOLN: 4.0000 mg | Freq: Four times a day (QID) | INTRAMUSCULAR | Status: DC | PRN

## 2021-03-09 MED ORDER — BUDESONIDE 0.5 MG/2ML IN SUSP
2.0000 mL | Freq: Every day | RESPIRATORY_TRACT | Status: DC
  Administered 2021-03-10 – 2021-03-12 (×3): 0.5 mg via RESPIRATORY_TRACT
  Filled 2021-03-09 (×4): qty 2

## 2021-03-09 MED ORDER — MELATONIN 5 MG PO TABS
5.0000 mg | ORAL_TABLET | Freq: Every day | ORAL | Status: DC
  Administered 2021-03-09 – 2021-03-11 (×4): 5 mg via ORAL
  Filled 2021-03-09 (×5): qty 1

## 2021-03-09 MED ORDER — ACETAMINOPHEN 325 MG PO TABS
650.0000 mg | ORAL_TABLET | ORAL | Status: DC | PRN
  Administered 2021-03-09 – 2021-03-10 (×3): 650 mg via ORAL
  Filled 2021-03-09 (×3): qty 2

## 2021-03-09 MED ORDER — ASPIRIN EC 81 MG PO TBEC
81.0000 mg | DELAYED_RELEASE_TABLET | Freq: Every day | ORAL | Status: DC
  Administered 2021-03-10 – 2021-03-12 (×3): 81 mg via ORAL
  Filled 2021-03-09 (×4): qty 1

## 2021-03-09 MED ORDER — ACETAMINOPHEN 650 MG RE SUPP
650.0000 mg | RECTAL | Status: DC | PRN
  Administered 2021-03-09: 650 mg via RECTAL
  Filled 2021-03-09: qty 1

## 2021-03-09 MED ORDER — PREDNISONE 5 MG PO TABS
5.0000 mg | ORAL_TABLET | Freq: Every day | ORAL | Status: DC
  Administered 2021-03-09 – 2021-03-12 (×4): 5 mg via ORAL
  Filled 2021-03-09 (×4): qty 1

## 2021-03-09 MED ORDER — ROPINIROLE HCL 1 MG PO TABS
3.0000 mg | ORAL_TABLET | Freq: Every day | ORAL | Status: DC
  Administered 2021-03-09 – 2021-03-11 (×4): 3 mg via ORAL
  Filled 2021-03-09 (×5): qty 3

## 2021-03-09 MED ORDER — IOHEXOL 350 MG/ML SOLN
80.0000 mL | Freq: Once | INTRAVENOUS | Status: AC | PRN
  Administered 2021-03-09: 80 mL via INTRAVENOUS

## 2021-03-09 MED ORDER — ACETAMINOPHEN 160 MG/5ML PO SOLN: 650.0000 mg | ORAL | Status: DC | PRN

## 2021-03-09 MED ORDER — STROKE: EARLY STAGES OF RECOVERY BOOK
Freq: Once | Status: AC
  Filled 2021-03-09: qty 1

## 2021-03-09 MED ORDER — TRAMADOL HCL 50 MG PO TABS
50.0000 mg | ORAL_TABLET | Freq: Every day | ORAL | Status: DC | PRN
  Administered 2021-03-09 – 2021-03-11 (×3): 50 mg via ORAL
  Filled 2021-03-09 (×3): qty 1

## 2021-03-09 NOTE — TOC CM/SW Note (Signed)
Called Wife [name] to discuss discharge planning and medicare notice. Poor signal called back and forth several times. Will reach out again . She is aware of PT recommendations. Currently, they are living in [place]

## 2021-03-09 NOTE — Progress Notes (Addendum)
STROKE TEAM PROGRESS NOTE   ATTENDING NOTE: I reviewed above note and agree with the assessment and plan. Pt was seen and examined.   88 year old male with history of stroke, TIA, hypertension, hyperlipidemia, diabetes, GI bleeding, back pain admitted for speech difficulty, garbled speech for 15 minutes.  CT no acute abnormality.  CT head and neck unremarkable.  MRI no acute infarct.  2D echo pending.  LDL 43, A1c pending.  EEG no seizure.  Creatinine 0.65.  Patient has extensive hx of episodes with speech difficulty in the past.   His first episode around 2010 when he was at the breakfast table and suddenly not able to communicate, difficult to get words out, with slowing thinking and processing in mind, no weakness or vision changes, lasting 2 hours and then resolved. He had work up with MRI was told negative and had cardiac monitoring for 2 days also negative. He was on ASA at that time and he was changed to plavix.   11/2013, he had another episode of difficulty getting words out,  no weakness or numbness and lasting 20-30 min resolved.  In 12/2013 he had similar episode again with word finding difficulty and slow mental processing, episode again lasted about 2 hours. He takes his plavix daily, MRI showed left temporal cortical infarct.  Other stroke work up unrevealing. He was discharged with plavix and followed up with his cardiologist Dr. Tollie Eth, had 30 day cardiac monitoring and was told no afib. He was enrolled to RESPECT ESUS trial to compare aspirin versus Pradaxa for stroke prevention in 04/2014.   In 07/2014 he had a TIA episode with word finding difficulty and MRI was negative.  He remained in trial. In 07/2015 unsteady on his feet with dizziness, N/V, found to have stroke with MRI showing tiny acute infarct in the superior left occipital lobe.  He was put off from the trial, was given option for Coumadin versus TEE and loop recorder.  Patient optioned Coumadin. In 02/2016 patient had  left sided weakness and ataix, found to have a stroke at right PLIC.  LDL 99, A1c 6.2.  EF 45%.  Coumadin discontinued.  Continued on aspirin and Plavix.  04/2018 had an ED visit for transient aphasia, unable to respond to the wife and a blank look on his face, considered TIA versus seizure.  At that time he was off Plavix for 5 days for GI bleeding.  CT, CTA head and neck, MRI negative.  Continued on Plavix and Zocor. 03/2020 had episode of 20 to 30 minutes of slurred speech and word finding difficulty.  Was on aspirin 81 at that time due to recurrent GI bleeding, recommend to increase to aspirin 325. 05/2020 again similar episode as before, lasting 20 minutes, patient did not go to ED. 07/2020 episode of speech impairment more severe than previous episode, did not go to PT.  Remains on aspirin 81 given GI bleeding on full dose aspirin.  Concerning for TIA versus seizure versus complicated migraine, started on trial of Zonegran 100 mg nightly.  Per wife, patient was given Zonegran since last office visit at [facility], however patient has not taken it for some time, not sure the reason behind it.  Patient is managing his medication by himself.  On exam, patient lying in bed, wife at bedside.  Patient drowsy sleepy, but open eyes on voice and stimulation, however not orientated, not able to answer questions with coherent sentences.  Not follow simple commands.  Still has left hemiparesis, moving  right side seems at baseline.  Patient stated that this is not his baseline.  But also stated that patient did not get sleep last night due to delirium.  Per wife, he has severe back pain, Friday he was given hydrocodone, and 4 hours after he had confusion and restless reaction to the medication.  Etiology for patient recurrent speech difficulty episodes is not quite clear.  However, work-up on this episode did not show evidence of stroke or seizure.  May still consider complicated migraine.  He was put on Zonegran last  office visit but since like he stopped the medication by himself.  Not sure if intolerance with medication or forget taking the medication.  We will need discussed with Janett Billow NP at [facility].  If intolerance with Zonegran, may consider Depakote.  Patient exam concerning for delirium, dementia and encephalopathy.  Continue supportive care and management per primary team.  Continue aspirin and Zocor.  We will follow.   I spent extensive time with the patient, and his wife, in counseling and coordination of care, reviewing previous charts, test results, images and medication, and discussing the diagnosis, treatment plan and potential prognosis. This patient's care requiresreview of multiple databases, neurological assessment, discussion with family, other specialists and medical decision making of high complexity.    [name], MD PhD Stroke Neurology 03/09/2021 6:01 PM    INTERVAL HISTORY His wife is at the bedside. Wife reports patient had a 15 minute episode of dysarthria that occurred when they were sitting at a table and having a conversation. Wife reports patient did not have a headache nor has had a hx of migraines. Currently, patient not oriented to context, place, time. Patient confused throughout assessment and largely incoherent. Patient is able to identify wife. Patient unable to comprehend commands and will say "yes" and "okay". Wife reports that this is a drastic deviation from his baseline. Wife also does mention that he has had some decline in cognition over past several months but appears to minimize this. Wife reports patient was taken off zonisamide by outpatient neurology. Wife reports patient has not had a bowel movement in 8 days which is not normal for him.  Vitals:   03/09/21 0200 03/09/21 0304 03/09/21 0505 03/09/21 0600  BP: 125/80 129/79 130/76 140/90  Pulse: 96 86 (!) 102 80  Resp: 17 16 19 18   Temp:      TempSrc:      SpO2: 97% 95% 95% 99%  Weight:      Height:        CBC:  Recent Labs  Lab 03/08/21 1930 03/08/21 2003 03/09/21 0330  WBC 8.9  --  10.4  NEUTROABS 5.2  --   --   HGB 11.2* 13.3 10.6*  HCT 36.7* 39.0 34.9*  MCV 87.8  --  88.1  PLT 491*  --  825*   Basic Metabolic Panel:  Recent Labs  Lab 03/08/21 1930 03/08/21 2003 03/09/21 0330  NA 136 136 134*  K 4.2 4.1 3.6  CL 102 100 102  CO2 24  --  25  GLUCOSE 108* 107* 129*  BUN 10 9 6*  CREATININE 0.78 0.70 0.65  CALCIUM 8.8*  --  8.4*   Lipid Panel:  Recent Labs  Lab 03/09/21 0330  CHOL 111  TRIG 88  HDL 50  CHOLHDL 2.2  VLDL 18  LDLCALC 43   HgbA1c: No results for input(s): HGBA1C in the last 168 hours. Urine Drug Screen: No results for input(s): LABOPIA, COCAINSCRNUR, LABBENZ,  AMPHETMU, THCU, LABBARB in the last 168 hours.  Alcohol Level  Recent Labs  Lab 03/08/21 1930  ETH <10    IMAGING past 24 hours CT ANGIO HEAD NECK W WO CM  Result Date: 03/09/2021 CLINICAL DATA:  Initial evaluation for acute TIA. EXAM: CT ANGIOGRAPHY HEAD AND NECK TECHNIQUE: Multidetector CT imaging of the head and neck was performed using the standard protocol during bolus administration of intravenous contrast. Multiplanar CT image reconstructions and MIPs were obtained to evaluate the vascular anatomy. Carotid stenosis measurements (when applicable) are obtained utilizing NASCET criteria, using the distal internal carotid diameter as the denominator. RADIATION DOSE REDUCTION: This exam was performed according to the departmental dose-optimization program which includes automated exposure control, adjustment of the mA and/or kV according to patient size and/or use of iterative reconstruction technique. CONTRAST:  40mL OMNIPAQUE IOHEXOL 350 MG/ML SOLN COMPARISON:  Prior head CT from 03/08/2021. FINDINGS: CTA NECK FINDINGS Aortic arch: Visualized aortic arch normal caliber with normal branch pattern. Mild-to-moderate plaque about the arch itself. No significant stenosis about the origin of the  great vessels. Right carotid system: Right common and internal carotid arteries patent without stenosis or dissection. Mild for age plaque about the right carotid bulb without stenosis. Left carotid system: Left common and internal carotid arteries patent without stenosis or dissection. Mild for age plaque about the left carotid bulb without stenosis. Vertebral arteries: Both vertebral arteries arise from subclavian arteries. No proximal subclavian artery stenosis. Vertebral arteries patent without stenosis or dissection. Skeleton: No discrete or worrisome osseous lesions. Other neck: No other acute soft tissue abnormality within the neck. Upper chest: Visualized upper chest demonstrates no acute finding. Review of the MIP images confirms the above findings CTA HEAD FINDINGS Anterior circulation: Both internal carotid arteries patent to the termini without stenosis. A1 segments patent bilaterally. Normal anterior communicating artery complex. Anterior cerebral arteries patent without stenosis. No M1 stenosis or occlusion. Normal MCA bifurcations. Distal MCA branches perfused and symmetric. Posterior circulation: Both V4 segments widely patent to the vertebrobasilar junction. Both PICA patent. Basilar widely patent to its distal aspect without stenosis. Superior cerebellar and posterior cerebral arteries widely patent bilaterally. Venous sinuses: Grossly patent allowing for timing the contrast bolus and motion. Anatomic variants: None significant.  No aneurysm. Review of the MIP images confirms the above findings IMPRESSION: 1. Negative CTA of the head and neck. No large vessel occlusion, hemodynamically significant stenosis, or other acute vascular abnormality. 2. Mild for age atheromatous disease about the carotid bifurcations and carotid siphons without stenosis. Electronically Signed   By: Jeannine Boga M.D.   On: 03/09/2021 03:44   CT HEAD WO CONTRAST  Result Date: 03/08/2021 CLINICAL DATA:  Fall on  01/16, pain EXAM: CT HEAD WITHOUT CONTRAST CT CERVICAL SPINE WITHOUT CONTRAST TECHNIQUE: Multidetector CT imaging of the head and cervical spine was performed following the standard protocol without intravenous contrast. Multiplanar CT image reconstructions of the cervical spine were also generated. RADIATION DOSE REDUCTION: This exam was performed according to the departmental dose-optimization program which includes automated exposure control, adjustment of the mA and/or kV according to patient size and/or use of iterative reconstruction technique. COMPARISON:  CT brain 04/01/2019, CT cervical spine 12/23/2017 FINDINGS: CT HEAD FINDINGS Brain: No acute territorial infarction, hemorrhage, or intracranial mass. Moderate atrophy. Moderate chronic small vessel ischemic changes of the white matter. Small chronic infarct in the right thalamic capsular region. Ventricle are stable to minimally enlarged compared to prior. Vascular: No hyperdense vessels.  Carotid vascular calcification  Skull: Normal. Negative for fracture or focal lesion. Sinuses/Orbits: Mucosal thickening in the ethmoid maxillary and frontal sinuses Other: None CT CERVICAL SPINE FINDINGS Alignment: Straightening of the cervical spine. 3 mm anterolisthesis C4 on C5. Facet alignment within normal limits. Skull base and vertebrae: No acute fracture. No primary bone lesion or focal pathologic process. Soft tissues and spinal canal: No prevertebral fluid or swelling. No visible canal hematoma. Disc levels: Degenerative changes at multiple levels. Partial ankylosis C5-C6. Marked disc space narrowing at C6-C7. Facet degenerative changes at multiple levels with foraminal stenosis. Upper chest: Negative. Other: None IMPRESSION: 1. No CT evidence for acute intracranial abnormality. Atrophy and chronic small vessel ischemic changes of the white matter. Ventricles slightly more prominent compared to prior head CT potentially due to slight atrophy progression. 2.  Straightening of the cervical spine with degenerative changes. No acute osseous abnormality is seen. Electronically Signed   By: Donavan Foil M.D.   On: 03/08/2021 21:29   CT CERVICAL SPINE WO CONTRAST  Result Date: 03/08/2021 CLINICAL DATA:  Fall on 01/16, pain EXAM: CT HEAD WITHOUT CONTRAST CT CERVICAL SPINE WITHOUT CONTRAST TECHNIQUE: Multidetector CT imaging of the head and cervical spine was performed following the standard protocol without intravenous contrast. Multiplanar CT image reconstructions of the cervical spine were also generated. RADIATION DOSE REDUCTION: This exam was performed according to the departmental dose-optimization program which includes automated exposure control, adjustment of the mA and/or kV according to patient size and/or use of iterative reconstruction technique. COMPARISON:  CT brain 04/01/2019, CT cervical spine 12/23/2017 FINDINGS: CT HEAD FINDINGS Brain: No acute territorial infarction, hemorrhage, or intracranial mass. Moderate atrophy. Moderate chronic small vessel ischemic changes of the white matter. Small chronic infarct in the right thalamic capsular region. Ventricle are stable to minimally enlarged compared to prior. Vascular: No hyperdense vessels.  Carotid vascular calcification Skull: Normal. Negative for fracture or focal lesion. Sinuses/Orbits: Mucosal thickening in the ethmoid maxillary and frontal sinuses Other: None CT CERVICAL SPINE FINDINGS Alignment: Straightening of the cervical spine. 3 mm anterolisthesis C4 on C5. Facet alignment within normal limits. Skull base and vertebrae: No acute fracture. No primary bone lesion or focal pathologic process. Soft tissues and spinal canal: No prevertebral fluid or swelling. No visible canal hematoma. Disc levels: Degenerative changes at multiple levels. Partial ankylosis C5-C6. Marked disc space narrowing at C6-C7. Facet degenerative changes at multiple levels with foraminal stenosis. Upper chest: Negative. Other:  None IMPRESSION: 1. No CT evidence for acute intracranial abnormality. Atrophy and chronic small vessel ischemic changes of the white matter. Ventricles slightly more prominent compared to prior head CT potentially due to slight atrophy progression. 2. Straightening of the cervical spine with degenerative changes. No acute osseous abnormality is seen. Electronically Signed   By: Donavan Foil M.D.   On: 03/08/2021 21:29   MR BRAIN WO CONTRAST  Result Date: 03/09/2021 CLINICAL DATA:  88 year old male status post fall on January 16th. Pain and altered mental status. TIA. EXAM: MRI HEAD WITHOUT CONTRAST TECHNIQUE: Multiplanar, multiecho pulse sequences of the brain and surrounding structures were obtained without intravenous contrast. COMPARISON:  Brain MRI 04/23/2018. FINDINGS: The examination had to be discontinued prior to completion by patient request. Only coronal T2 weighted imaging was not obtained, although some sequences are mildly degraded by motion. Brain: No restricted diffusion or evidence of acute infarction. Moderate T2 heterogeneity throughout the bilateral deep gray matter nuclei, appears to be combination of enlarged perivascular spaces and chronic lacunar infarcts. Left thalamic lacune is chronic  but new since 2020. Chronic right posterior deep white matter capsule chronic lacunar ischemia. And there is mild chronic Wallerian degeneration in the right brainstem. No cortical encephalomalacia or definite chronic cerebral blood products. Moderate Patchy and confluent bilateral cerebral white matter T2 and FLAIR hyperintensity also appears increased since 2020. Vascular: Major intracranial vascular flow voids are stable. Mild generalized intracranial artery ectasia. Skull and upper cervical spine: Negative visible cervical spine. Normal bone marrow signal. Sinuses/Orbits: Orbits are stable and negative. Chronic paranasal sinus disease, mildly improved since 2020. Other: Mastoid air cells are well  aerated, with regressed left side mastoid effusion since 2020. negative visible scalp and face. IMPRESSION: 1. No acute intracranial abnormality. The examinationIs mildly motion degraded, and no coronal T2 weighted imaging was obtained. 2. Moderate for age chronic small vessel disease, progressed in the left thalamus and bilateral cerebral white matter since 2020. Electronically Signed   By: Genevie Ann M.D.   On: 03/09/2021 04:35   CT T-SPINE NO CHARGE  Result Date: 03/08/2021 CLINICAL DATA:  Low back pain EXAM: CT THORACIC AND LUMBAR SPINE WITHOUT CONTRAST TECHNIQUE: Multidetector CT imaging of the thoracic and lumbar spine was performed without contrast. Multiplanar CT image reconstructions were also generated. RADIATION DOSE REDUCTION: This exam was performed according to the departmental dose-optimization program which includes automated exposure control, adjustment of the mA and/or kV according to patient size and/or use of iterative reconstruction technique. COMPARISON:  None. FINDINGS: CT THORACIC SPINE FINDINGS Alignment: Normal Vertebrae: Slight compression deformity at T12 compatible with slight compression fracture. No additional acute bony abnormality or focal bone lesion. Paraspinal and other soft tissues: Normal Disc levels: Early disc space narrowing and spurring. CT LUMBAR SPINE FINDINGS Segmentation: 5 lumbar type vertebrae. Alignment: No subluxation. Convex rightward scoliosis centered in the upper to mid lumbar spine. Vertebrae: No fracture or focal bone lesion. Paraspinal and other soft tissues: Normal Disc levels: Advanced diffuse degenerative disc disease and facet disease. IMPRESSION: CT THORACIC SPINE IMPRESSION Slight compression fracture at T12. No retropulsed fracture fragments. CT LUMBAR SPINE IMPRESSION No acute bony abnormality. Convex rightward scoliosis. Moderate to advanced degenerative disc and facet disease. Electronically Signed   By: Rolm Baptise M.D.   On: 03/08/2021 21:25    CT L-SPINE NO CHARGE  Result Date: 03/08/2021 CLINICAL DATA:  Low back pain EXAM: CT THORACIC AND LUMBAR SPINE WITHOUT CONTRAST TECHNIQUE: Multidetector CT imaging of the thoracic and lumbar spine was performed without contrast. Multiplanar CT image reconstructions were also generated. RADIATION DOSE REDUCTION: This exam was performed according to the departmental dose-optimization program which includes automated exposure control, adjustment of the mA and/or kV according to patient size and/or use of iterative reconstruction technique. COMPARISON:  None. FINDINGS: CT THORACIC SPINE FINDINGS Alignment: Normal Vertebrae: Slight compression deformity at T12 compatible with slight compression fracture. No additional acute bony abnormality or focal bone lesion. Paraspinal and other soft tissues: Normal Disc levels: Early disc space narrowing and spurring. CT LUMBAR SPINE FINDINGS Segmentation: 5 lumbar type vertebrae. Alignment: No subluxation. Convex rightward scoliosis centered in the upper to mid lumbar spine. Vertebrae: No fracture or focal bone lesion. Paraspinal and other soft tissues: Normal Disc levels: Advanced diffuse degenerative disc disease and facet disease. IMPRESSION: CT THORACIC SPINE IMPRESSION Slight compression fracture at T12. No retropulsed fracture fragments. CT LUMBAR SPINE IMPRESSION No acute bony abnormality. Convex rightward scoliosis. Moderate to advanced degenerative disc and facet disease. Electronically Signed   By: Rolm Baptise M.D.   On: 03/08/2021 21:25  CT CHEST ABDOMEN PELVIS WO CONTRAST  Result Date: 03/08/2021 CLINICAL DATA:  Fall on 03/03/2021.  Low back pain.  Abdominal pain. EXAM: CT CHEST, ABDOMEN AND PELVIS WITHOUT CONTRAST TECHNIQUE: Multidetector CT imaging of the chest, abdomen and pelvis was performed following the standard protocol without IV contrast. RADIATION DOSE REDUCTION: This exam was performed according to the departmental dose-optimization program which  includes automated exposure control, adjustment of the mA and/or kV according to patient size and/or use of iterative reconstruction technique. COMPARISON:  08/18/2020 FINDINGS: CT CHEST FINDINGS Cardiovascular: Heart is normal size. Aorta is normal caliber. Scattered aortic and coronary artery calcifications. Mediastinum/Nodes: No mediastinal, hilar, or axillary adenopathy. Trachea and esophagus are unremarkable. Thyroid unremarkable. Moderate-sized hiatal hernia. Lungs/Pleura: Lungs are clear. No focal airspace opacities or suspicious nodules. No effusions. Musculoskeletal: No acute bony abnormality. CT ABDOMEN PELVIS FINDINGS Hepatobiliary: No focal hepatic abnormality. Gallbladder unremarkable. Pancreas: No focal abnormality or ductal dilatation. Spleen: Prior splenectomy. Adrenals/Urinary Tract: No adrenal abnormality. No focal renal abnormality. No stones or hydronephrosis. Urinary bladder is unremarkable. Stomach/Bowel: Colonic diverticulosis. No active diverticulitis. No bowel obstruction. Stomach and small bowel grossly unremarkable. Vascular/Lymphatic: Aortic atherosclerosis. No evidence of aneurysm or adenopathy. Reproductive: Mildly prominent prostate with central calcifications. Other: No free fluid or free air. Small left inguinal hernia containing fat. Musculoskeletal: Rightward scoliosis in the lumbar spine. No acute bony abnormality. IMPRESSION: No acute findings in the chest, abdomen or pelvis. Moderate-sized hiatal hernia. Coronary artery disease, aortic atherosclerosis. Colonic diverticulosis. Electronically Signed   By: Rolm Baptise M.D.   On: 03/08/2021 21:20    PHYSICAL EXAM  Temp:  [98.2 F (36.8 C)] 98.2 F (36.8 C) (01/21 1921) Pulse Rate:  [80-102] 80 (01/22 0600) Resp:  [16-22] 18 (01/22 0600) BP: (125-166)/(76-98) 140/90 (01/22 0600) SpO2:  [94 %-99 %] 99 % (01/22 0600) Weight:  [70.3 kg] 70.3 kg (01/21 1921)  General - pleasant but confused elder caucasian  male.  Cardiovascular - tachycardic.  Mental Status -  Oriented to self but not to age, context, location, time. Unable to assess language, attention span, concentration, memory due to lack of comprehension and confusion.   Cranial Nerves II - XII - unable to assess due to noncooperation  Motor Strength - The patients strength was normal in all extremities.   Motor Tone - Muscle tone was assessed at the neck and appendages and was normal.  Reflexes - The patients reflexes were symmetrical in all extremities and he had no pathological reflexes.  Sensory - unable to assess due to noncooperation.    Coordination - unable to assess due to noncooperation.  Gait and Station - deferred.  ASSESSMENT/PLAN [name] is a 88 y.o. male with history of previous stroke and TIA, recurrent GI bleed, PAF not on anticoagulation, esophageal stenosis, chronic systolic CHF, N8GN, HLD, asthma, chronic prednisone use presenting with 15 minute episode of incomprehensible speech.   TIA vs Seizure vs complex migraine CTA head & neck: no LVO, no significant stenosis, but mild atheromatous disease about carotid bifurcations and carotid siphons without stenosis MRI  No acute intracranial abnormality 2D Echo pending EEG pending LDL 43 HgbA1c 6.3 VTE prophylaxis - SCD    Diet   Diet heart healthy/carb modified Room service appropriate? Yes; Fluid consistency: Thin   No antithrombotic prior to admission, now on aspirin 81 mg daily.  EEG: moderate diffuse slowing indicative of global cerebral dysfunction but no epileptiform abnormalities were noted Therapy recommendations: SNF Disposition:  pending  Hyperlipidemia Home meds:  zocor LDL 43, goal < 70 Zocor 40 mg continued this admission High intensity statin not indicated  Continue statin at discharge  Diabetes type II  Home meds:  metformin HgbA1c 6.3, goal < 7.0 CBGs SSI  Other Stroke Risk Factors Advanced Age >/= 18  ETOH use,  alcohol level <10, advised to drink no more than 1-2 drink(s) a day Hx stroke/TIA Congestive heart failure  Other Active Problems Systolic and diastolic CHF Hx of Esophageal stricture  Cough variant asthma T12 compression fracture  Hospital day # 0  France Ravens, MD PGY1 Resident  To contact Stroke Continuity provider, please refer to [email or web address] After hours, contact General Neurology

## 2021-03-09 NOTE — Procedures (Signed)
Routine EEG Report  [name] is a 88 y.o. male with a history of transient expressive aphasia who is undergoing an EEG to evaluate for seizures.  Report: This EEG was acquired with electrodes placed according to the International 10-20 electrode system (including Fp1, Fp2, F3, F4, C3, C4, P3, P4, O1, O2, T3, T4, T5, T6, A1, A2, Fz, Cz, Pz). The following electrodes were missing or displaced: none.  The occipital dominant rhythm was 5-6 Hz. This activity is reactive to stimulation. Drowsiness was manifested by background fragmentation; deeper stages of sleep were identified by K complexes and sleep spindles. There was no focal slowing. There were no interictal epileptiform discharges. Photic stimulation and hyperventilation were not performed.   Impression and clinical correlation: This EEG was obtained while awake and asleep and is abnormal due to moderate diffuse slowing indicative of global cerebral dysfunction. Epileptiform abnormalities were not seen during this recording.  Su Monks, MD [facility] [phone]  If 7pm- 7am, please page neurology on call as listed in [place].

## 2021-03-09 NOTE — Assessment & Plan Note (Addendum)
History of CVA Patient with transient expressive aphasia lasting approximately 15 minutes.  History suggestive of TIA.  Patient admitted for further work-up. -Neurology following -CT head negative for acute intracranial abnormality -Obtain MRI brain -Obtain CTA head/neck -Continue aspirin 81 mg daily, unlikely be candidate for further aggressive antiplatelet therapy given recurrent GI bleeding issues -Obtain echocardiogram -Continue telemetry, neurochecks -PT/OT/SLP eval -Continue simvastatin

## 2021-03-09 NOTE — Consult Note (Signed)
Neurology Consultation Reason for Consult: TIA Referring Physician: Posey Pronto, V  CC: Transient speech difficulty  History is obtained from: Patient, family  HPI: [name] is a 88 y.o. male who was in his normal state of health until this afternoon when he had a transient episode of incomprehensible speech.  He states that he never had trouble understanding others, but his family states that he was completely incomprehensible.  It lasted for approximately 15 minutes.  He does have a history of stroke as well as TIA, but the symptoms were different than previous.  He denies numbness, weakness, visual change, vertigo.  He does have a history of vertebral fracture for which he is taking tramadol, he took one approximately 2 hours prior to this event, no narcotics.   LKW: 5 PM tpa given?: no, resolution of symptoms   ROS: A 14 point ROS was performed and is negative except as noted in the HPI.  Past Medical History:  Diagnosis Date   Acute CVA (cerebrovascular accident) ([place]) 07/25/2015   Asthma    Benign essential HTN    Cerebral infarction due to embolism of left middle cerebral artery ([place]) 02/03/2014   Chronic combined systolic and diastolic CHF (congestive heart failure) ([place]) 12/23/2017   Colitis    Colon polyps    Diabetes mellitus type 2, diet-controlled ([place]) 12/22/2013   Diverticulosis    GERD (gastroesophageal reflux disease)    GI bleed    GI bleed    Hemorrhoids    Hiatal hernia    History of esophageal strciture    HLD (hyperlipidemia)    Osteoarthritis    Proctitis    Status post dilation of esophageal narrowing    Stroke [facility])      Family History  Problem Relation Age of Onset   Alzheimer's disease Mother    Breast cancer Mother    Throat cancer Father    Colon cancer Neg Hx    Colon polyps Neg Hx    Pancreatic cancer Neg Hx    Stomach cancer Neg Hx    Liver disease Neg Hx    Esophageal cancer Neg Hx    Rectal cancer Neg Hx      Social History:   reports that he has never smoked. He has never used smokeless tobacco. He reports current alcohol use of about 2.0 standard drinks per week. He reports that he does not use drugs.   Exam: Current vital signs: BP (!) 136/97    Pulse 86    Temp 98.2 F (36.8 C) (Oral)    Resp 16    Ht 5\' 6"  (1.676 m)    Wt 70.3 kg    SpO2 94%    BMI 25.02 kg/m  Vital signs in last 24 hours: Temp:  [98.2 F (36.8 C)] 98.2 F (36.8 C) (01/21 1921) Pulse Rate:  [80-96] 86 (01/22 0015) Resp:  [16-22] 16 (01/22 0015) BP: (136-166)/(80-98) 136/97 (01/22 0015) SpO2:  [94 %-99 %] 94 % (01/22 0015) Weight:  [70.3 kg] 70.3 kg (01/21 1921)   Physical Exam  Constitutional: Appears well-developed and well-nourished.  Psych: Affect appropriate to situation Eyes: No scleral injection HENT: No OP obstruction MSK: no joint deformities.  Cardiovascular: Normal rate and regular rhythm.  Respiratory: Effort normal, non-labored breathing GI: Soft.  No distension. There is no tenderness.  Skin: WDI  Neuro: Mental Status: Patient is awake, alert, oriented to person, place, month, year, and situation. Patient is able to give a clear and coherent history.  No signs of aphasia or neglect Cranial Nerves: II: Visual Fields are full. Pupils are equal, round, and reactive to light.   III,IV, VI: EOMI without ptosis or diploplia.  V: Facial sensation is symmetric to temperature VII: Facial movement is symmetric.  VIII: hearing is intact to voice X: Uvula elevates symmetrically XI: Shoulder shrug is symmetric. XII: tongue is midline without atrophy or fasciculations.  Motor: Tone is normal. Bulk is normal. 5/5 strength was present on the right, 4/5 in the left arm and leg Sensory: Sensation is symmetric to light touch and temperature in the arms and legs. Cerebellar: FNF and HKS are intact bilaterally     I have reviewed labs in epic and the results pertinent to this consultation are: Creatinine 0.78 Hemoglobin  11.2   I have reviewed the images obtained: CT head-negative  Impression: 88 year old male with a history of previous stroke and TIA who presents with transient incomprehensible speech lasting about 15 minutes.  My suspicion is that this does represent TIA and I would favor further evaluation for secondary stroke prevention.  Unfortunately currently he is not a candidate for more aggressive antiplatelet therapy given his multiple GI bleeds.  Recommendations: - HgbA1c, fasting lipid panel - MRI of the brain without contrast - Frequent neuro checks - Echocardiogram - CTA head and neck - Prophylactic therapy-Antiplatelet med: Aspirin - dose 81mg  - Risk factor modification - Telemetry monitoring - PT consult, OT consult, Speech consult - Stroke team to follow    Roland Rack, MD [facility] [phone]  If 7pm- 7am, please page neurology on call as listed in [place].

## 2021-03-09 NOTE — Assessment & Plan Note (Signed)
Remains in sinus rhythm on admission.  Not on anticoagulation due to recurrent GI bleeding issues.  Continue aspirin 81 mg daily.  Not requiring rate or rhythm controlling medications as an outpatient.

## 2021-03-09 NOTE — ED Notes (Signed)
Ortho at bedside w/ TLSO brace

## 2021-03-09 NOTE — ED Notes (Signed)
Pt found in room w/ urine soaked bed, pt had removed cardiac monitor, blankets and gown. 2 Rns helped change pt, new linens, gown, blankets & condom cath placed

## 2021-03-09 NOTE — Progress Notes (Signed)
EEG done at bedside on cooperative patient that was able at time to answer all memory questions correctly. No skin breakdown noted. Results pending.

## 2021-03-09 NOTE — Assessment & Plan Note (Signed)
Follows with [facility], Dr. Ardis Hughs.  EGD attempted 02/28/2021 had to be aborted due to inability to pass through tortuous stenotic esophagus.  Follow SLP eval.

## 2021-03-09 NOTE — ED Notes (Signed)
Pt found to be in bed naked, has pulled off condom cath and monitoring leads. Pt reoriented, gown replaced. Cardiac monitoring leads placed back on pt and brief placed on pt for incontinence. Attempted to give ordered meds PO, but pt is unable to follow commands and spits out anything put in his mouth at this time. Pt able to be reoriented for short period of time, but quickly forgets again. Will notify MD. Will attempt PO meds at a later time. Pt left in room in bed with bed rails up X 2. Call light within reach. Pt covered with blanket for comfort. Lights dimmed to allow pt to rest.

## 2021-03-09 NOTE — Assessment & Plan Note (Signed)
Hold metformin, check A1c.

## 2021-03-09 NOTE — Progress Notes (Signed)
PROGRESS NOTE    [name]  ONG:[id number] DOB: 06-Sep-1931 DOA: 03/08/2021 PCP: Seward Carol, MD   Brief Narrative:  [name] is a 88 y.o. male with medical history significant for history of CVA, recurrent GI bleed, paroxysmal atrial fibrillation not on anticoagulation, esophageal stenosis, chronic systolic CHF (last EF 44% 02/2016), T2DM, HLD, asthma, chronic prednisone use (on 5 mg daily for asthma and chronic bronchitis) who presented to the ED for evaluation of abnormal speech. Patient has a history of recurrent GI bleeding.  He takes aspirin 81 mg daily and states that he has not tolerated dual antiplatelet therapy in the past due to bleeding.  ED Course:  Initial vitals showed BP 166/97, pulse 82, RR 22, temp 98.2 F, SPO2 99% on room air.Labs show WBC 8.9, hemoglobin 11.2, platelets 491,000, sodium 136, potassium 4.2, bicarb 24, BUN 10, creatinine 0.78, serum glucose 108, LFTs within normal limits, serum ethanol undetectable, urinalysis negative for UTI. CT head without contrast negative for acute intracranial abnormality.  Atrophy and chronic small vessel ischemic changes of white matter seen.  Ventricles slightly more prominent compared to prior head CT. CT cervical spine without contrast shows straightening of the C-spine with degenerative changes, no acute osseous abnormality. CT thoracic spine shows slight compression fracture at T12, no retropulsed fracture fragments. CT lumbar spine negative for acute bony abnormality.  Convex rightward scoliosis and moderate to advanced degenerative disc and facet disease noted. CT chest/abdomen/pelvis negative for acute findings.  Moderate sized hiatal hernia, CAD, aortic atherosclerosis and colonic diverticulosis noted. TLSO brace was ordered.  Neurology were consulted and the hospitalist service was consulted to admit for further evaluation and management.  Assessment & Plan:   Strokelike symptoms: -Could be secondary to TIA versus  seizures versus complex migraine versus delirium -CTA head and neck, MRI brain: Negative for acute findings. -Continue on telemetry monitoring -Transthoracic echo is pending.  LDL: 43, A1c: 6.3 -Neurology on board-appreciate help -EEG shows moderate diffuse slowing indicative of global cerebral dysfunction but no epileptiform abnormalities were noted. -PT/OT recommended SNF -On fall precautions/seizure precautions  Paroxysmal A. fib: -Remains in sinus rhythm.  Not on anticoagulation due to history of GI bleeding. -Continue aspirin. -Not on rate controlling medication -Monitor closely on telemetry  Type 2 diabetes: A1c 6.3% -Hold metformin.  Monitor blood sugar closely.  History of esophageal stricture: -Followed by GI outpatient. -EGD attempted on 02/28/2021 had to be aborted due to inability to pass through tortuous stenotic esophagus. -Needs SLP evaluation.  Chronic combined systolic and diastolic WNU:UVOZ from 3664 showed ejection fraction of 45% with grade 1 diastolic dysfunction.  -Patient appears euvolemic on exam.  Strict INO's.  Cough variant asthma: -Stable.  No wheezing on exam.  Continue home inhalers. -Continue prednisone 5 mg daily  T12 compression fracture: -Chronic. -Reviewed CT imaging.  Continue tramadol as needed for pain.  Continue TLSO brace placed in ED.  Hyperlipidemia: Continue statin  Normocytic anemia: -H&H 10.6/34.9.  Continue to monitor.  Chronic thrombocytosis: Platelet 467.  DVT prophylaxis: SCD Code Status: full code-confirmed with wife. Family Communication: None present at bedside.  Plan of care discussed with patient in length and he verbalized understanding and agreed with it.  I called patient's wife and discussed plan of care and she verbalized understanding.  Disposition Plan: SNF  Consultants:  neurology  Procedures:  EEG  Antimicrobials:  none   Status is: Observation    Subjective: Patient seen and examined.  Sleepy  but arousable.  Appears very  pleasant.  Communicating well.  Following commands.  Denies any complaints.  Objective: Vitals:   03/09/21 0900 03/09/21 0938 03/09/21 0945 03/09/21 1302  BP: 129/89   (!) 138/98  Pulse: 90 (!) 108  (!) 103  Resp: 16   18  Temp:  98.8 F (37.1 C) 98.8 F (37.1 C)   TempSrc:  Axillary Rectal   SpO2: 98% 96%  96%  Weight:      Height:        Intake/Output Summary (Last 24 hours) at 03/09/2021 1450 Last data filed at 03/09/2021 0231 Gross per 24 hour  Intake --  Output 150 ml  Net -150 ml   Filed Weights   03/08/21 1921  Weight: 70.3 kg    Examination:  General exam: Appears calm and comfortable, elderly deconditioned male, on room air Respiratory system: Clear to auscultation. Respiratory effort normal. Cardiovascular system: S1 & S2 heard, RRR. No JVD, murmurs, rubs, gallops or clicks. No pedal edema. Gastrointestinal system: Abdomen is nondistended, soft and nontender. No organomegaly or masses felt. Normal bowel sounds heard. Central nervous system: Alert, confused.   Extremities: Symmetric 5 x 5 power. Skin: No rashes, lesions or ulcers   Data Reviewed: I have personally reviewed following labs and imaging studies  CBC: Recent Labs  Lab 03/08/21 1930 03/08/21 2003 03/09/21 0330  WBC 8.9  --  10.4  NEUTROABS 5.2  --   --   HGB 11.2* 13.3 10.6*  HCT 36.7* 39.0 34.9*  MCV 87.8  --  88.1  PLT 491*  --  329*   Basic Metabolic Panel: Recent Labs  Lab 03/08/21 1930 03/08/21 2003 03/09/21 0330  NA 136 136 134*  K 4.2 4.1 3.6  CL 102 100 102  CO2 24  --  25  GLUCOSE 108* 107* 129*  BUN 10 9 6*  CREATININE 0.78 0.70 0.65  CALCIUM 8.8*  --  8.4*   GFR: Estimated Creatinine Clearance: 58.7 mL/min (by C-G formula based on SCr of 0.65 mg/dL). Liver Function Tests: Recent Labs  Lab 03/08/21 1930  AST 18  ALT 11  ALKPHOS 71  BILITOT 0.4  PROT 6.4*  ALBUMIN 3.7   No results for input(s): LIPASE, AMYLASE in the last 168  hours. No results for input(s): AMMONIA in the last 168 hours. Coagulation Profile: Recent Labs  Lab 03/08/21 1930  INR 1.0   Cardiac Enzymes: No results for input(s): CKTOTAL, CKMB, CKMBINDEX, TROPONINI in the last 168 hours. BNP (last 3 results) No results for input(s): PROBNP in the last 8760 hours. HbA1C: No results for input(s): HGBA1C in the last 72 hours. CBG: No results for input(s): GLUCAP in the last 168 hours. Lipid Profile: Recent Labs    03/09/21 0330  CHOL 111  HDL 50  LDLCALC 43  TRIG 88  CHOLHDL 2.2   Thyroid Function Tests: No results for input(s): TSH, T4TOTAL, FREET4, T3FREE, THYROIDAB in the last 72 hours. Anemia Panel: No results for input(s): VITAMINB12, FOLATE, FERRITIN, TIBC, IRON, RETICCTPCT in the last 72 hours. Sepsis Labs: No results for input(s): PROCALCITON, LATICACIDVEN in the last 168 hours.  Recent Results (from the past 240 hour(s))  Resp Panel by RT-PCR (Flu A&B, Covid) Nasopharyngeal Swab     Status: None   Collection Time: 03/08/21  7:30 PM   Specimen: Nasopharyngeal Swab; Nasopharyngeal(NP) swabs in vial transport medium  Result Value Ref Range Status   SARS Coronavirus 2 by RT PCR NEGATIVE NEGATIVE Final    Comment: (NOTE) SARS-CoV-2 target nucleic  acids are NOT DETECTED.  The SARS-CoV-2 RNA is generally detectable in upper respiratory specimens during the acute phase of infection. The lowest concentration of SARS-CoV-2 viral copies this assay can detect is 138 copies/mL. A negative result does not preclude SARS-Cov-2 infection and should not be used as the sole basis for treatment or other patient management decisions. A negative result may occur with  improper specimen collection/handling, submission of specimen other than nasopharyngeal swab, presence of viral mutation(s) within the areas targeted by this assay, and inadequate number of viral copies(<138 copies/mL). A negative result must be combined with clinical  observations, patient history, and epidemiological information. The expected result is Negative.  Fact Sheet for Patients:  EntrepreneurPulse.com.au  Fact Sheet for Healthcare Providers:  IncredibleEmployment.be  This test is no t yet approved or cleared by the [place] FDA and  has been authorized for detection and/or diagnosis of SARS-CoV-2 by FDA under an Emergency Use Authorization (EUA). This EUA will remain  in effect (meaning this test can be used) for the duration of the COVID-19 declaration under Section 564(b)(1) of the Act, 21 U.S.C.section 360bbb-3(b)(1), unless the authorization is terminated  or revoked sooner.       Influenza A by PCR NEGATIVE NEGATIVE Final   Influenza B by PCR NEGATIVE NEGATIVE Final    Comment: (NOTE) The Xpert Xpress SARS-CoV-2/FLU/RSV plus assay is intended as an aid in the diagnosis of influenza from Nasopharyngeal swab specimens and should not be used as a sole basis for treatment. Nasal washings and aspirates are unacceptable for Xpert Xpress SARS-CoV-2/FLU/RSV testing.  Fact Sheet for Patients: EntrepreneurPulse.com.au  Fact Sheet for Healthcare Providers: IncredibleEmployment.be  This test is not yet approved or cleared by the [place] FDA and has been authorized for detection and/or diagnosis of SARS-CoV-2 by FDA under an Emergency Use Authorization (EUA). This EUA will remain in effect (meaning this test can be used) for the duration of the COVID-19 declaration under Section 564(b)(1) of the Act, 21 U.S.C. section 360bbb-3(b)(1), unless the authorization is terminated or revoked.  Performed at [facility], [place] [address], [place], [place] [address]       Radiology Studies: CT ANGIO HEAD NECK W WO CM  Result Date: 03/09/2021 CLINICAL DATA:  Initial evaluation for acute TIA. EXAM: CT ANGIOGRAPHY HEAD AND NECK TECHNIQUE: Multidetector CT  imaging of the head and neck was performed using the standard protocol during bolus administration of intravenous contrast. Multiplanar CT image reconstructions and MIPs were obtained to evaluate the vascular anatomy. Carotid stenosis measurements (when applicable) are obtained utilizing NASCET criteria, using the distal internal carotid diameter as the denominator. RADIATION DOSE REDUCTION: This exam was performed according to the departmental dose-optimization program which includes automated exposure control, adjustment of the mA and/or kV according to patient size and/or use of iterative reconstruction technique. CONTRAST:  25mL OMNIPAQUE IOHEXOL 350 MG/ML SOLN COMPARISON:  Prior head CT from 03/08/2021. FINDINGS: CTA NECK FINDINGS Aortic arch: Visualized aortic arch normal caliber with normal branch pattern. Mild-to-moderate plaque about the arch itself. No significant stenosis about the origin of the great vessels. Right carotid system: Right common and internal carotid arteries patent without stenosis or dissection. Mild for age plaque about the right carotid bulb without stenosis. Left carotid system: Left common and internal carotid arteries patent without stenosis or dissection. Mild for age plaque about the left carotid bulb without stenosis. Vertebral arteries: Both vertebral arteries arise from subclavian arteries. No proximal subclavian artery stenosis. Vertebral arteries patent without  stenosis or dissection. Skeleton: No discrete or worrisome osseous lesions. Other neck: No other acute soft tissue abnormality within the neck. Upper chest: Visualized upper chest demonstrates no acute finding. Review of the MIP images confirms the above findings CTA HEAD FINDINGS Anterior circulation: Both internal carotid arteries patent to the termini without stenosis. A1 segments patent bilaterally. Normal anterior communicating artery complex. Anterior cerebral arteries patent without stenosis. No M1 stenosis or  occlusion. Normal MCA bifurcations. Distal MCA branches perfused and symmetric. Posterior circulation: Both V4 segments widely patent to the vertebrobasilar junction. Both PICA patent. Basilar widely patent to its distal aspect without stenosis. Superior cerebellar and posterior cerebral arteries widely patent bilaterally. Venous sinuses: Grossly patent allowing for timing the contrast bolus and motion. Anatomic variants: None significant.  No aneurysm. Review of the MIP images confirms the above findings IMPRESSION: 1. Negative CTA of the head and neck. No large vessel occlusion, hemodynamically significant stenosis, or other acute vascular abnormality. 2. Mild for age atheromatous disease about the carotid bifurcations and carotid siphons without stenosis. Electronically Signed   By: Jeannine Boga M.D.   On: 03/09/2021 03:44   CT HEAD WO CONTRAST  Result Date: 03/08/2021 CLINICAL DATA:  Fall on 01/16, pain EXAM: CT HEAD WITHOUT CONTRAST CT CERVICAL SPINE WITHOUT CONTRAST TECHNIQUE: Multidetector CT imaging of the head and cervical spine was performed following the standard protocol without intravenous contrast. Multiplanar CT image reconstructions of the cervical spine were also generated. RADIATION DOSE REDUCTION: This exam was performed according to the departmental dose-optimization program which includes automated exposure control, adjustment of the mA and/or kV according to patient size and/or use of iterative reconstruction technique. COMPARISON:  CT brain 04/01/2019, CT cervical spine 12/23/2017 FINDINGS: CT HEAD FINDINGS Brain: No acute territorial infarction, hemorrhage, or intracranial mass. Moderate atrophy. Moderate chronic small vessel ischemic changes of the white matter. Small chronic infarct in the right thalamic capsular region. Ventricle are stable to minimally enlarged compared to prior. Vascular: No hyperdense vessels.  Carotid vascular calcification Skull: Normal. Negative for  fracture or focal lesion. Sinuses/Orbits: Mucosal thickening in the ethmoid maxillary and frontal sinuses Other: None CT CERVICAL SPINE FINDINGS Alignment: Straightening of the cervical spine. 3 mm anterolisthesis C4 on C5. Facet alignment within normal limits. Skull base and vertebrae: No acute fracture. No primary bone lesion or focal pathologic process. Soft tissues and spinal canal: No prevertebral fluid or swelling. No visible canal hematoma. Disc levels: Degenerative changes at multiple levels. Partial ankylosis C5-C6. Marked disc space narrowing at C6-C7. Facet degenerative changes at multiple levels with foraminal stenosis. Upper chest: Negative. Other: None IMPRESSION: 1. No CT evidence for acute intracranial abnormality. Atrophy and chronic small vessel ischemic changes of the white matter. Ventricles slightly more prominent compared to prior head CT potentially due to slight atrophy progression. 2. Straightening of the cervical spine with degenerative changes. No acute osseous abnormality is seen. Electronically Signed   By: Donavan Foil M.D.   On: 03/08/2021 21:29   CT CERVICAL SPINE WO CONTRAST  Result Date: 03/08/2021 CLINICAL DATA:  Fall on 01/16, pain EXAM: CT HEAD WITHOUT CONTRAST CT CERVICAL SPINE WITHOUT CONTRAST TECHNIQUE: Multidetector CT imaging of the head and cervical spine was performed following the standard protocol without intravenous contrast. Multiplanar CT image reconstructions of the cervical spine were also generated. RADIATION DOSE REDUCTION: This exam was performed according to the departmental dose-optimization program which includes automated exposure control, adjustment of the mA and/or kV according to patient size and/or use of iterative  reconstruction technique. COMPARISON:  CT brain 04/01/2019, CT cervical spine 12/23/2017 FINDINGS: CT HEAD FINDINGS Brain: No acute territorial infarction, hemorrhage, or intracranial mass. Moderate atrophy. Moderate chronic small vessel  ischemic changes of the white matter. Small chronic infarct in the right thalamic capsular region. Ventricle are stable to minimally enlarged compared to prior. Vascular: No hyperdense vessels.  Carotid vascular calcification Skull: Normal. Negative for fracture or focal lesion. Sinuses/Orbits: Mucosal thickening in the ethmoid maxillary and frontal sinuses Other: None CT CERVICAL SPINE FINDINGS Alignment: Straightening of the cervical spine. 3 mm anterolisthesis C4 on C5. Facet alignment within normal limits. Skull base and vertebrae: No acute fracture. No primary bone lesion or focal pathologic process. Soft tissues and spinal canal: No prevertebral fluid or swelling. No visible canal hematoma. Disc levels: Degenerative changes at multiple levels. Partial ankylosis C5-C6. Marked disc space narrowing at C6-C7. Facet degenerative changes at multiple levels with foraminal stenosis. Upper chest: Negative. Other: None IMPRESSION: 1. No CT evidence for acute intracranial abnormality. Atrophy and chronic small vessel ischemic changes of the white matter. Ventricles slightly more prominent compared to prior head CT potentially due to slight atrophy progression. 2. Straightening of the cervical spine with degenerative changes. No acute osseous abnormality is seen. Electronically Signed   By: Donavan Foil M.D.   On: 03/08/2021 21:29   MR BRAIN WO CONTRAST  Result Date: 03/09/2021 CLINICAL DATA:  88 year old male status post fall on January 16th. Pain and altered mental status. TIA. EXAM: MRI HEAD WITHOUT CONTRAST TECHNIQUE: Multiplanar, multiecho pulse sequences of the brain and surrounding structures were obtained without intravenous contrast. COMPARISON:  Brain MRI 04/23/2018. FINDINGS: The examination had to be discontinued prior to completion by patient request. Only coronal T2 weighted imaging was not obtained, although some sequences are mildly degraded by motion. Brain: No restricted diffusion or evidence of  acute infarction. Moderate T2 heterogeneity throughout the bilateral deep gray matter nuclei, appears to be combination of enlarged perivascular spaces and chronic lacunar infarcts. Left thalamic lacune is chronic but new since 2020. Chronic right posterior deep white matter capsule chronic lacunar ischemia. And there is mild chronic Wallerian degeneration in the right brainstem. No cortical encephalomalacia or definite chronic cerebral blood products. Moderate Patchy and confluent bilateral cerebral white matter T2 and FLAIR hyperintensity also appears increased since 2020. Vascular: Major intracranial vascular flow voids are stable. Mild generalized intracranial artery ectasia. Skull and upper cervical spine: Negative visible cervical spine. Normal bone marrow signal. Sinuses/Orbits: Orbits are stable and negative. Chronic paranasal sinus disease, mildly improved since 2020. Other: Mastoid air cells are well aerated, with regressed left side mastoid effusion since 2020. negative visible scalp and face. IMPRESSION: 1. No acute intracranial abnormality. The examinationIs mildly motion degraded, and no coronal T2 weighted imaging was obtained. 2. Moderate for age chronic small vessel disease, progressed in the left thalamus and bilateral cerebral white matter since 2020. Electronically Signed   By: Genevie Ann M.D.   On: 03/09/2021 04:35   CT T-SPINE NO CHARGE  Result Date: 03/08/2021 CLINICAL DATA:  Low back pain EXAM: CT THORACIC AND LUMBAR SPINE WITHOUT CONTRAST TECHNIQUE: Multidetector CT imaging of the thoracic and lumbar spine was performed without contrast. Multiplanar CT image reconstructions were also generated. RADIATION DOSE REDUCTION: This exam was performed according to the departmental dose-optimization program which includes automated exposure control, adjustment of the mA and/or kV according to patient size and/or use of iterative reconstruction technique. COMPARISON:  None. FINDINGS: CT THORACIC  SPINE FINDINGS Alignment: Normal  Vertebrae: Slight compression deformity at T12 compatible with slight compression fracture. No additional acute bony abnormality or focal bone lesion. Paraspinal and other soft tissues: Normal Disc levels: Early disc space narrowing and spurring. CT LUMBAR SPINE FINDINGS Segmentation: 5 lumbar type vertebrae. Alignment: No subluxation. Convex rightward scoliosis centered in the upper to mid lumbar spine. Vertebrae: No fracture or focal bone lesion. Paraspinal and other soft tissues: Normal Disc levels: Advanced diffuse degenerative disc disease and facet disease. IMPRESSION: CT THORACIC SPINE IMPRESSION Slight compression fracture at T12. No retropulsed fracture fragments. CT LUMBAR SPINE IMPRESSION No acute bony abnormality. Convex rightward scoliosis. Moderate to advanced degenerative disc and facet disease. Electronically Signed   By: Rolm Baptise M.D.   On: 03/08/2021 21:25   CT L-SPINE NO CHARGE  Result Date: 03/08/2021 CLINICAL DATA:  Low back pain EXAM: CT THORACIC AND LUMBAR SPINE WITHOUT CONTRAST TECHNIQUE: Multidetector CT imaging of the thoracic and lumbar spine was performed without contrast. Multiplanar CT image reconstructions were also generated. RADIATION DOSE REDUCTION: This exam was performed according to the departmental dose-optimization program which includes automated exposure control, adjustment of the mA and/or kV according to patient size and/or use of iterative reconstruction technique. COMPARISON:  None. FINDINGS: CT THORACIC SPINE FINDINGS Alignment: Normal Vertebrae: Slight compression deformity at T12 compatible with slight compression fracture. No additional acute bony abnormality or focal bone lesion. Paraspinal and other soft tissues: Normal Disc levels: Early disc space narrowing and spurring. CT LUMBAR SPINE FINDINGS Segmentation: 5 lumbar type vertebrae. Alignment: No subluxation. Convex rightward scoliosis centered in the upper to mid lumbar  spine. Vertebrae: No fracture or focal bone lesion. Paraspinal and other soft tissues: Normal Disc levels: Advanced diffuse degenerative disc disease and facet disease. IMPRESSION: CT THORACIC SPINE IMPRESSION Slight compression fracture at T12. No retropulsed fracture fragments. CT LUMBAR SPINE IMPRESSION No acute bony abnormality. Convex rightward scoliosis. Moderate to advanced degenerative disc and facet disease. Electronically Signed   By: Rolm Baptise M.D.   On: 03/08/2021 21:25   EEG adult  Result Date: 03/09/2021 Derek Jack, MD     03/09/2021 12:54 PM Routine EEG Report [name] is a 88 y.o. male with a history of transient expressive aphasia who is undergoing an EEG to evaluate for seizures. Report: This EEG was acquired with electrodes placed according to the International 10-20 electrode system (including Fp1, Fp2, F3, F4, C3, C4, P3, P4, O1, O2, T3, T4, T5, T6, A1, A2, Fz, Cz, Pz). The following electrodes were missing or displaced: none. The occipital dominant rhythm was 5-6 Hz. This activity is reactive to stimulation. Drowsiness was manifested by background fragmentation; deeper stages of sleep were identified by K complexes and sleep spindles. There was no focal slowing. There were no interictal epileptiform discharges. Photic stimulation and hyperventilation were not performed. Impression and clinical correlation: This EEG was obtained while awake and asleep and is abnormal due to moderate diffuse slowing indicative of global cerebral dysfunction. Epileptiform abnormalities were not seen during this recording. Su Monks, MD [facility] [phone] If 7pm- 7am, please page neurology on call as listed in [place].   CT CHEST ABDOMEN PELVIS WO CONTRAST  Result Date: 03/08/2021 CLINICAL DATA:  Fall on 03/03/2021.  Low back pain.  Abdominal pain. EXAM: CT CHEST, ABDOMEN AND PELVIS WITHOUT CONTRAST TECHNIQUE: Multidetector CT imaging of the chest, abdomen and pelvis was  performed following the standard protocol without IV contrast. RADIATION DOSE REDUCTION: This exam was performed according to the departmental dose-optimization program which  includes automated exposure control, adjustment of the mA and/or kV according to patient size and/or use of iterative reconstruction technique. COMPARISON:  08/18/2020 FINDINGS: CT CHEST FINDINGS Cardiovascular: Heart is normal size. Aorta is normal caliber. Scattered aortic and coronary artery calcifications. Mediastinum/Nodes: No mediastinal, hilar, or axillary adenopathy. Trachea and esophagus are unremarkable. Thyroid unremarkable. Moderate-sized hiatal hernia. Lungs/Pleura: Lungs are clear. No focal airspace opacities or suspicious nodules. No effusions. Musculoskeletal: No acute bony abnormality. CT ABDOMEN PELVIS FINDINGS Hepatobiliary: No focal hepatic abnormality. Gallbladder unremarkable. Pancreas: No focal abnormality or ductal dilatation. Spleen: Prior splenectomy. Adrenals/Urinary Tract: No adrenal abnormality. No focal renal abnormality. No stones or hydronephrosis. Urinary bladder is unremarkable. Stomach/Bowel: Colonic diverticulosis. No active diverticulitis. No bowel obstruction. Stomach and small bowel grossly unremarkable. Vascular/Lymphatic: Aortic atherosclerosis. No evidence of aneurysm or adenopathy. Reproductive: Mildly prominent prostate with central calcifications. Other: No free fluid or free air. Small left inguinal hernia containing fat. Musculoskeletal: Rightward scoliosis in the lumbar spine. No acute bony abnormality. IMPRESSION: No acute findings in the chest, abdomen or pelvis. Moderate-sized hiatal hernia. Coronary artery disease, aortic atherosclerosis. Colonic diverticulosis. Electronically Signed   By: Rolm Baptise M.D.   On: 03/08/2021 21:20    Scheduled Meds:  aspirin EC  81 mg Oral Daily   budesonide  2 mL Nebulization Daily   melatonin  5 mg Oral QHS   pantoprazole  40 mg Oral Q breakfast    predniSONE  5 mg Oral Daily   rOPINIRole  3 mg Oral QHS   simvastatin  40 mg Oral QHS   Continuous Infusions:   LOS: 0 days   Time spent: 35 minutes   Nihaal Friesen Loann Quill, MD [facility]  If 7PM-7AM, please contact night-coverage [email or web address] 03/09/2021, 2:50 PM

## 2021-03-09 NOTE — Assessment & Plan Note (Signed)
Chronic back pain due to T12 compression fracture, mild by CT imaging.  Continue tramadol as needed.  TLSO brace placed in ED.

## 2021-03-09 NOTE — ED Notes (Signed)
Patient transported to MRI 

## 2021-03-09 NOTE — Evaluation (Signed)
Physical Therapy Evaluation Patient Details Name: [name] MRN: [id number] DOB: September 10, 1931 Today's Date: 03/09/2021  History of Present Illness  Pt is an 88 y.o. male who presented 03/08/21 for abnormal speech and L-sided facial droop, whcih resolved. Being worked up for possible TIA. No CT or MRI evidence for acute intracranial abnormality or CT evidence of acute osseous abnormality in cervical spine. Radiographs of lumbar region revealed indeterminate age fractures of T10, T11, T12 and L4 new compared to prior CT scan of July 2022. PMH: CVA, recurrent GI bleed, paroxysmal atrial fibrillation not on anticoagulation, esophageal stenosis, chronic systolic CHF (last EF 96% 02/2016), T2DM, HLD, asthma, chronic prednisone use (on 5 mg daily for asthma and chronic bronchitis)   Clinical Impression  Pt presents with condition above and deficits mentioned below, see PT Problem List. PTA, he was mod I using a rollator for household mobility and a power w/c in the community. He lives with his wife in an [place], which does offer other venue options. Currently, pt is primarily limited by lethargy and impaired cognitive status, closing eyes often and only following cues to open eyes. Otherwise, pt would verbally agree to participate but then physically resist all cues or attempts at mobility. Thus, he required TA to sit up EOB, quickly pushing self to the R to return to bed. Communicated findings and PT recs with wife via phone call, who is in agreement with current plan. Educated wife to let pt get some sleep this morning and re-orient him when able to try to reduce his confusion. Hopefully the confusion will subside and allow him to progress well with mobility. At this time, recommending follow-up therapy at a SNF prior to returning to his ILF. If he progresses well though this recommendation may change. Will continue to follow acutely.       Recommendations for follow up therapy are one component of a  multi-disciplinary discharge planning process, led by the attending physician.  Recommendations may be updated based on patient status, additional functional criteria and insurance authorization.  Follow Up Recommendations Skilled nursing-short term rehab (<3 hours/day) (pending progression)    Assistance Recommended at Discharge Frequent or constant Supervision/Assistance  Patient can return home with the following  Two people to help with walking and/or transfers;Two people to help with bathing/dressing/bathroom;A lot of help with bathing/dressing/bathroom;A lot of help with walking and/or transfers;Assistance with cooking/housework;Assistance with feeding;Direct supervision/assist for medications management;Direct supervision/assist for financial management;Assist for transportation;Help with stairs or ramp for entrance    Equipment Recommendations Other (comment) (TBA)  Recommendations for Other Services       Functional Status Assessment Patient has had a recent decline in their functional status and demonstrates the ability to make significant improvements in function in a reasonable and predictable amount of time.     Precautions / Restrictions Precautions Precautions: Fall;Back Precaution Booklet Issued: Yes (comment) (reviewed, but pt not appearing to retain any info) Precaution Comments: L-residual weakness, wears AFO and knee brace per chart Required Braces or Orthoses: Spinal Brace Spinal Brace: Thoracolumbosacral orthotic Restrictions Weight Bearing Restrictions: No      Mobility  Bed Mobility Overal bed mobility: Needs Assistance Bed Mobility: Sidelying to Sit, Sit to Sidelying   Sidelying to sit: Total assist, HOB elevated     Sit to sidelying: Mod assist General bed mobility comments: Pt agreeing verbally to sit up, but no initiation with max cues, needing TA to transition sidelying to sitting (pt already rolled on R side upon entry). Instead, pt  resisting and  pushing self to R to return to sidelying, modA to control trunk and lift legs.    Transfers                   General transfer comment: Unable due to pt not following cues and physically resisting    Ambulation/Gait               General Gait Details: Unable due to pt not following cues and physically resisting  Stairs            Wheelchair Mobility    Modified Rankin (Stroke Patients Only) Modified Rankin (Stroke Patients Only) Pre-Morbid Rankin Score: Moderate disability Modified Rankin: Severe disability     Balance Overall balance assessment: Needs assistance Sitting-balance support: Single extremity supported, Bilateral upper extremity supported, Feet unsupported Sitting balance-Leahy Scale: Zero Sitting balance - Comments: Pt pushing self to R, grabbing therapist's leg and into pockets without appearing to understand what he was doing, needing TA to sit edge of stretcher briefly. Postural control: Right lateral lean     Standing balance comment: Unable due to pt not following cues and physically resisting                             Pertinent Vitals/Pain Pain Assessment Pain Assessment: Faces Faces Pain Scale: Hurts little more Pain Location: assuming back, but did not specify Pain Descriptors / Indicators: Discomfort, Grimacing Pain Intervention(s): Limited activity within patient's tolerance, Monitored during session, Repositioned    Home Living Family/patient expects to be discharged to:: Assisted living (ILF, the [facility]) Living Arrangements: Spouse/significant other Available Help at Discharge: Family (majority of time wife is available) Type of Home: Independent living facility Home Access: Level entry       Home Layout: One level Home Equipment: Shower seat;Grab bars - tub/shower;Grab bars - toilet;Rollator (4 wheels);Wheelchair - power;Other (comment);Hand held shower head;Adaptive equipment (adjustable bed with rail on  his side) Additional Comments: has a caregiver that comes in 2 days/week    Prior Function Prior Level of Function : History of Falls (last six months)             Mobility Comments: x1 fall in 6 months, which was earlier this January. Pt uses rollator mod I for household distances. Uses power w/c in community. ADLs Comments: Pt able to perform all ADLs mod I majority of time, with wife assisting intermittently as needed. Wife puts clothes out for him, but otherwise he dresses himself, even donns his AFO and knee brace mod I.     Hand Dominance        Extremity/Trunk Assessment   Upper Extremity Assessment Upper Extremity Assessment: Defer to OT evaluation    Lower Extremity Assessment Lower Extremity Assessment: LLE deficits/detail;RLE deficits/detail RLE Deficits / Details: Limited hamstring flexibility to about ~10 degrees knee extension with hip flexed, but pt also resisting all attempts to move legs in bed; reacts to light touch at feet, sensation appears intact LLE Deficits / Details: Limited hamstring flexibility to about ~45 degrees knee extension with hip flexed, but pt also resisting all attempts to move legs in bed; reacts slower to light touch at feet compared to R, possible residual L sided sensation deficits from prior CVA; chart reveals L-sided residual weakness from prior CVA, normally uses AFO and knee brace LLE Sensation: decreased light touch    Cervical / Trunk Assessment Cervical / Trunk Assessment: Kyphotic;Other exceptions Cervical /  Trunk Exceptions: Thoracic and lumbar fxs  Communication   Communication: No difficulties  Cognition Arousal/Alertness: Lethargic Behavior During Therapy: WFL for tasks assessed/performed Overall Cognitive Status: Impaired/Different from baseline Area of Impairment: Orientation, Memory, Attention, Following commands, Safety/judgement, Awareness, Problem solving                 Orientation Level: Disoriented to,  Place, Time, Situation Current Attention Level: Focused Memory: Decreased short-term memory, Decreased recall of precautions Following Commands: Follows one step commands inconsistently, Follows one step commands with increased time Safety/Judgement: Decreased awareness of safety, Decreased awareness of deficits Awareness: Intellectual Problem Solving: Slow processing, Decreased initiation, Difficulty sequencing, Requires verbal cues, Requires tactile cues General Comments: Pt very pleasant, smiling often during session. Unaware of location and date. Pt making random statements off subject, almost like talking in his sleep. Pt closing eyes often, follows cues to open eyes but does not follow any other cues. Pt agrees verbally to cues provided, like "sit up", but then physically resists.        General Comments General comments (skin integrity, edema, etc.): Called wife to get info after session with her understanding SNF recs at this time; educated wife to re-orient pt but allow him to sleep this morning to try to improve cognitive status; pt with attempts but no success at bouts of emesis    Exercises     Assessment/Plan    PT Assessment Patient needs continued PT services  PT Problem List Decreased strength;Decreased range of motion;Decreased activity tolerance;Decreased balance;Decreased mobility;Decreased coordination;Decreased cognition;Decreased safety awareness;Decreased knowledge of precautions;Impaired sensation       PT Treatment Interventions DME instruction;Gait training;Functional mobility training;Therapeutic activities;Therapeutic exercise;Balance training;Neuromuscular re-education;Cognitive remediation;Patient/family education    PT Goals (Current goals can be found in the Care Plan section)  Acute Rehab PT Goals Patient Stated Goal: to get warm; wife wants pt to improve and eventually return with her to ILF PT Goal Formulation: With patient/family Time For Goal  Achievement: 03/23/21 Potential to Achieve Goals: Good    Frequency Min 3X/week     Co-evaluation               AM-PAC PT "6 Clicks" Mobility  Outcome Measure Help needed turning from your back to your side while in a flat bed without using bedrails?: Total Help needed moving from lying on your back to sitting on the side of a flat bed without using bedrails?: Total Help needed moving to and from a bed to a chair (including a wheelchair)?: Total Help needed standing up from a chair using your arms (e.g., wheelchair or bedside chair)?: Total Help needed to walk in hospital room?: Total Help needed climbing 3-5 steps with a railing? : Total 6 Click Score: 6    End of Session   Activity Tolerance: Patient limited by lethargy Patient left: in bed;with call bell/phone within reach Nurse Communication: Mobility status PT Visit Diagnosis: Muscle weakness (generalized) (M62.81);History of falling (Z91.81);Difficulty in walking, not elsewhere classified (R26.2);Other symptoms and signs involving the nervous system (R29.898)    Time: 7846-9629 PT Time Calculation (min) (ACUTE ONLY): 43 min   Charges:   PT Evaluation $PT Eval Moderate Complexity: 1 Mod PT Treatments $Therapeutic Activity: 23-37 mins        Moishe Spice, PT, DPT Acute Rehabilitation Services  Pager: [phone] Office: [phone]   Orvan Falconer 03/09/2021, 9:10 AM

## 2021-03-09 NOTE — Assessment & Plan Note (Signed)
Compensated and euvolemic on admission.  Last EF 45% January 2018.  Not requiring diuretic as an outpatient.

## 2021-03-09 NOTE — ED Notes (Signed)
EEG at bedside.

## 2021-03-09 NOTE — Assessment & Plan Note (Signed)
Continue simvastatin. 

## 2021-03-09 NOTE — Progress Notes (Signed)
Orthopedic Tech Progress Note Patient Details:  THERIN VETSCH [birth year] [id number]  Ortho Devices Type of Ortho Device: Thoracolumbar corset (TLSO) Ortho Device/Splint Interventions: Ordered, Application, Adjustment   Post Interventions Patient Tolerated: Well Instructions Provided: Care of device, Adjustment of device  Karolee Stamps 03/09/2021, 12:06 AM

## 2021-03-09 NOTE — Assessment & Plan Note (Signed)
Stable.  Continue Pulmicort, albuterol as needed, and chronic prednisone 5 mg daily.

## 2021-03-09 NOTE — ED Notes (Signed)
Attempted to call pt's wife to obtain baseline mental status per MD request. No answer at this time. Voicemail left.

## 2021-03-10 ENCOUNTER — Observation Stay (HOSPITAL_COMMUNITY): Payer: Medicare Other

## 2021-03-10 DIAGNOSIS — E119 Type 2 diabetes mellitus without complications: Secondary | ICD-10-CM | POA: Diagnosis present

## 2021-03-10 DIAGNOSIS — G8929 Other chronic pain: Secondary | ICD-10-CM | POA: Diagnosis present

## 2021-03-10 DIAGNOSIS — R4701 Aphasia: Secondary | ICD-10-CM | POA: Diagnosis present

## 2021-03-10 DIAGNOSIS — D649 Anemia, unspecified: Secondary | ICD-10-CM | POA: Diagnosis present

## 2021-03-10 DIAGNOSIS — I472 Ventricular tachycardia, unspecified: Secondary | ICD-10-CM | POA: Diagnosis not present

## 2021-03-10 DIAGNOSIS — D75838 Other thrombocytosis: Secondary | ICD-10-CM | POA: Diagnosis present

## 2021-03-10 DIAGNOSIS — G8194 Hemiplegia, unspecified affecting left nondominant side: Secondary | ICD-10-CM | POA: Diagnosis present

## 2021-03-10 DIAGNOSIS — I110 Hypertensive heart disease with heart failure: Secondary | ICD-10-CM | POA: Diagnosis present

## 2021-03-10 DIAGNOSIS — J45991 Cough variant asthma: Secondary | ICD-10-CM | POA: Diagnosis present

## 2021-03-10 DIAGNOSIS — M4854XA Collapsed vertebra, not elsewhere classified, thoracic region, initial encounter for fracture: Secondary | ICD-10-CM | POA: Diagnosis present

## 2021-03-10 DIAGNOSIS — K5900 Constipation, unspecified: Secondary | ICD-10-CM | POA: Diagnosis present

## 2021-03-10 DIAGNOSIS — G459 Transient cerebral ischemic attack, unspecified: Secondary | ICD-10-CM | POA: Diagnosis present

## 2021-03-10 DIAGNOSIS — E785 Hyperlipidemia, unspecified: Secondary | ICD-10-CM | POA: Diagnosis present

## 2021-03-10 DIAGNOSIS — Z7401 Bed confinement status: Secondary | ICD-10-CM | POA: Diagnosis not present

## 2021-03-10 DIAGNOSIS — E222 Syndrome of inappropriate secretion of antidiuretic hormone: Secondary | ICD-10-CM | POA: Diagnosis present

## 2021-03-10 DIAGNOSIS — Z20822 Contact with and (suspected) exposure to covid-19: Secondary | ICD-10-CM | POA: Diagnosis present

## 2021-03-10 DIAGNOSIS — I5042 Chronic combined systolic (congestive) and diastolic (congestive) heart failure: Secondary | ICD-10-CM | POA: Diagnosis present

## 2021-03-10 DIAGNOSIS — G9389 Other specified disorders of brain: Secondary | ICD-10-CM | POA: Diagnosis present

## 2021-03-10 DIAGNOSIS — M2550 Pain in unspecified joint: Secondary | ICD-10-CM | POA: Diagnosis not present

## 2021-03-10 DIAGNOSIS — K222 Esophageal obstruction: Secondary | ICD-10-CM | POA: Diagnosis present

## 2021-03-10 DIAGNOSIS — I480 Paroxysmal atrial fibrillation: Secondary | ICD-10-CM | POA: Diagnosis present

## 2021-03-10 DIAGNOSIS — G9341 Metabolic encephalopathy: Secondary | ICD-10-CM | POA: Diagnosis present

## 2021-03-10 DIAGNOSIS — F05 Delirium due to known physiological condition: Secondary | ICD-10-CM | POA: Diagnosis not present

## 2021-03-10 DIAGNOSIS — I2510 Atherosclerotic heart disease of native coronary artery without angina pectoris: Secondary | ICD-10-CM | POA: Diagnosis present

## 2021-03-10 DIAGNOSIS — I700 Atherosclerosis of aorta: Secondary | ICD-10-CM | POA: Diagnosis present

## 2021-03-10 DIAGNOSIS — R569 Unspecified convulsions: Secondary | ICD-10-CM | POA: Diagnosis present

## 2021-03-10 DIAGNOSIS — G43109 Migraine with aura, not intractable, without status migrainosus: Secondary | ICD-10-CM | POA: Diagnosis not present

## 2021-03-10 LAB — BASIC METABOLIC PANEL
Anion gap: 7 (ref 5–15)
BUN: 13 mg/dL (ref 8–23)
CO2: 23 mmol/L (ref 22–32)
Calcium: 8.7 mg/dL — ABNORMAL LOW (ref 8.9–10.3)
Chloride: 101 mmol/L (ref 98–111)
Creatinine, Ser: 0.76 mg/dL (ref 0.61–1.24)
GFR, Estimated: >60 mL/min (ref >60)
Glucose, Bld: 130 mg/dL — ABNORMAL HIGH (ref 70–99)
Potassium: 3.8 mmol/L (ref 3.5–5.1)
Sodium: 131 mmol/L — ABNORMAL LOW (ref 135–145)

## 2021-03-10 LAB — ECHOCARDIOGRAM COMPLETE
Area-P 1/2: 4.54 cm2
Height: 66 in
S' Lateral: 3 cm
Weight: 2313.95 oz

## 2021-03-10 LAB — CBC
HCT: 34.9 % — ABNORMAL LOW (ref 39.0–52.0)
Hemoglobin: 10.8 g/dL — ABNORMAL LOW (ref 13.0–17.0)
MCH: 26.7 pg (ref 26.0–34.0)
MCHC: 30.9 g/dL (ref 30.0–36.0)
MCV: 86.4 fL (ref 80.0–100.0)
Platelets: 506 10*3/uL — ABNORMAL HIGH (ref 150–400)
RBC: 4.04 MIL/uL — ABNORMAL LOW (ref 4.22–5.81)
RDW: 18.7 % — ABNORMAL HIGH (ref 11.5–15.5)
WBC: 10.3 10*3/uL (ref 4.0–10.5)
nRBC: 0 % (ref 0.0–0.2)

## 2021-03-10 LAB — SODIUM, URINE, RANDOM: Sodium, Ur: 84 mmol/L

## 2021-03-10 LAB — HEMOGLOBIN A1C
Hgb A1c MFr Bld: 6.3 % — ABNORMAL HIGH (ref 4.8–5.6)
Mean Plasma Glucose: 134 mg/dL

## 2021-03-10 LAB — URIC ACID: Uric Acid, Serum: 3.6 mg/dL — ABNORMAL LOW (ref 3.7–8.6)

## 2021-03-10 LAB — OSMOLALITY, URINE: Osmolality, Ur: 570 mOsm/kg (ref 300–900)

## 2021-03-10 LAB — OSMOLALITY: Osmolality: 281 mOsm/kg (ref 275–295)

## 2021-03-10 MED ORDER — ZONISAMIDE 100 MG PO CAPS
100.0000 mg | ORAL_CAPSULE | Freq: Every day | ORAL | Status: DC
  Administered 2021-03-10 – 2021-03-12 (×3): 100 mg via ORAL
  Filled 2021-03-10 (×3): qty 1

## 2021-03-10 MED ORDER — VITAMIN D3 25 MCG (1000 UNIT) PO TABS
1000.0000 [IU] | ORAL_TABLET | Freq: Every day | ORAL | Status: DC
  Administered 2021-03-10 – 2021-03-12 (×3): 1000 [IU] via ORAL
  Filled 2021-03-10 (×6): qty 1

## 2021-03-10 MED ORDER — POTASSIUM CHLORIDE CRYS ER 20 MEQ PO TBCR
20.0000 meq | EXTENDED_RELEASE_TABLET | Freq: Once | ORAL | Status: AC
Start: 2021-03-10 — End: 2021-03-10
  Administered 2021-03-10: 20 meq via ORAL
  Filled 2021-03-10: qty 1

## 2021-03-10 MED ORDER — FUROSEMIDE 20 MG PO TABS
20.0000 mg | ORAL_TABLET | Freq: Once | ORAL | Status: AC
  Administered 2021-03-10: 20 mg via ORAL
  Filled 2021-03-10: qty 1

## 2021-03-10 MED ORDER — LIP MEDEX EX OINT
TOPICAL_OINTMENT | CUTANEOUS | Status: DC | PRN
  Filled 2021-03-10: qty 7

## 2021-03-10 MED ORDER — DIVALPROEX SODIUM 250 MG PO DR TAB
500.0000 mg | DELAYED_RELEASE_TABLET | Freq: Every day | ORAL | Status: DC
  Administered 2021-03-10 – 2021-03-11 (×2): 500 mg via ORAL
  Filled 2021-03-10 (×2): qty 2

## 2021-03-10 MED ORDER — VITAMIN B-12 100 MCG PO TABS
100.0000 ug | ORAL_TABLET | Freq: Every day | ORAL | Status: DC
  Administered 2021-03-10 – 2021-03-12 (×3): 100 ug via ORAL
  Filled 2021-03-10 (×3): qty 1

## 2021-03-10 NOTE — Care Management Obs Status (Signed)
[facility] NOTIFICATION   Patient Details  Name: [name] MRN: [id number] Date of Birth: August 15, 1931   Medicare Observation Status Notification Given:  Yes    Benard Halsted, LCSW 03/10/2021, 9:30 AM

## 2021-03-10 NOTE — Progress Notes (Signed)
PROGRESS NOTE    [name]  TOI:[id number] DOB: [birth year] DOA: 03/08/2021 PCP: Grant Carol, MD   Brief Narrative:  [name] is a 88 y.o. male with medical history significant for history of CVA, recurrent GI bleed, paroxysmal atrial fibrillation not on anticoagulation, esophageal stenosis, chronic systolic CHF (last EF 83% 02/2016), T2DM, HLD, asthma, chronic prednisone use (on 5 mg daily for asthma and chronic bronchitis) who presented to the ED for evaluation of abnormal speech. Patient has a history of recurrent GI bleeding.  He takes aspirin 81 mg daily and states that he has not tolerated dual antiplatelet therapy in the past due to bleeding.  ED Course:  Initial vitals showed BP 166/97, pulse 82, RR 22, temp 98.2 F, SPO2 99% on room air.Labs show WBC 8.9, hemoglobin 11.2, platelets 491,000, sodium 136, potassium 4.2, bicarb 24, BUN 10, creatinine 0.78, serum glucose 108, LFTs within normal limits, serum ethanol undetectable, urinalysis negative for UTI. CT head without contrast negative for acute intracranial abnormality.  Atrophy and chronic small vessel ischemic changes of white matter seen.  Ventricles slightly more prominent compared to prior head CT. CT cervical spine without contrast shows straightening of the C-spine with degenerative changes, no acute osseous abnormality. CT thoracic spine shows slight compression fracture at T12, no retropulsed fracture fragments. CT lumbar spine negative for acute bony abnormality.  Convex rightward scoliosis and moderate to advanced degenerative disc and facet disease noted. CT chest/abdomen/pelvis negative for acute findings.  Moderate sized hiatal hernia, CAD, aortic atherosclerosis and colonic diverticulosis noted. TLSO brace was ordered.  Neurology were consulted and the hospitalist service was consulted to admit for further evaluation and management.  Assessment & Plan:   Strokelike symptoms: -Could be secondary to TIA versus  seizures versus complex migraine versus delirium -CTA head and neck, MRI brain: Negative for acute findings. -Continue on telemetry monitoring -Transthoracic echo is pending.  LDL: 43, A1c: 6.3 -Neurology on board-appreciate help, case discussed with stroke MD Dr.Xu, he will confirm with his neurology partners about his antiseizure regimen and may change that based on his conversation. -EEG shows moderate diffuse slowing indicative of global cerebral dysfunction but no epileptiform abnormalities were noted. -PT/OT recommended SNF -On fall precautions/seizure precautions  Paroxysmal A. fib: -Remains in sinus rhythm.  Not on anticoagulation due to history of GI bleeding. -Continue aspirin. -Not on rate controlling medication -Monitor closely on telemetry  Type 2 diabetes: A1c 6.3% -Hold metformin.  Monitor blood sugar closely.  History of esophageal stricture: -Followed by GI outpatient. -EGD attempted on 02/28/2021 had to be aborted due to inability to pass through tortuous stenotic esophagus. -Needs SLP evaluation.  Chronic combined systolic and diastolic JAS:NKNL from 9767 showed ejection fraction of 45% with grade 1 diastolic dysfunction.  -Patient appears euvolemic on exam.  Strict INO's.  Cough variant asthma: -Stable.  No wheezing on exam.  Continue home inhalers. -Continue prednisone 5 mg daily  T12 compression fracture: -Chronic. -Reviewed CT imaging.  Continue tramadol as needed for pain.  Continue TLSO brace placed in ED when out of bed.  Hyperlipidemia: Continue statin  Normocytic anemia: -H&H 10.6/34.9.  Continue to monitor.  Chronic thrombocytosis: Platelet 467.  Metabolic encephalopathy -hospital-acquired delirium due to unfamiliar settings and advanced age, supportive care minimize narcotics and benzodiazepines   DVT prophylaxis: SCD Code Status: full code-confirmed with wife. Family Communication: None present at bedside.  Plan of care discussed with  patient in length and he verbalized understanding and agreed with it.  I  called patient's wife and discussed plan of care and she verbalized understanding.  Disposition Plan: SNF  Consultants:  neurology  Procedures:   EEG - non acute CT Abd Pelvis  - No acute findings in the chest, abdomen or pelvis. Moderate-sized hiatal hernia. Coronary artery disease, aortic atherosclerosis. Colonic diverticulosis. CT Head & C Spine - 1. No CT evidence for acute intracranial abnormality. Atrophy and chronic small vessel ischemic changes of the white matter. Ventricles slightly more prominent compared to prior head CT potentially due to slight atrophy progression. 2. Straightening of the cervical spine with degenerative changes. No acute osseous abnormality is seen. CT T & L Spine  - CT THORACIC SPINE IMPRESSION Slight compression fracture at T12. No retropulsed fracture fragments. CT LUMBAR SPINE IMPRESSION No acute bony abnormality. Convex rightward scoliosis. Moderate to advanced degenerative disc and facet disease. MRI - 1. No acute intracranial abnormality. The examinationIs mildly motion degraded, and no coronal T2 weighted imaging was obtained. 2. Moderate for age chronic small vessel disease, progressed in the left thalamus and bilateral cerebral white matter since 2020    Antimicrobials:  none   Status is: Observation    Subjective:  Patient in bed, appears comfortable, denies any headache, no fever, no chest pain or pressure, no shortness of breath , no abdominal pain. No new focal weakness, mild neck pain.     Objective: Vitals:   03/09/21 2327 03/10/21 0400 03/10/21 0600 03/10/21 0753  BP: 126/80 (!) 129/106  (!) 148/100  Pulse: 88 88    Resp: 20 18  19   Temp: 98.3 F (36.8 C) 98.4 F (36.9 C)  98.2 F (36.8 C)  TempSrc: Oral   Oral  SpO2: 94% 96%  98%  Weight:   65.6 kg   Height:        Intake/Output Summary (Last 24 hours) at 03/10/2021 0853 Last data filed at 03/09/2021  2300 Gross per 24 hour  Intake --  Output 375 ml  Net -375 ml   Filed Weights   03/08/21 1921 03/10/21 0600  Weight: 70.3 kg 65.6 kg    Examination:  Awake Alert x1, No new F.N deficits,   Lydia.AT,PERRAL Supple Neck, No JVD,   Symmetrical Chest wall movement, Good air movement bilaterally, CTAB RRR,No Gallops, Rubs or new Murmurs,  +ve B.Sounds, Abd Soft, No tenderness,   No Cyanosis, Clubbing or edema     Data Reviewed: I have personally reviewed following labs and imaging studies  Recent Labs  Lab 03/08/21 1930 03/08/21 2003 03/09/21 0330 03/10/21 0116  WBC 8.9  --  10.4 10.3  HGB 11.2* 13.3 10.6* 10.8*  HCT 36.7* 39.0 34.9* 34.9*  PLT 491*  --  467* 506*  MCV 87.8  --  88.1 86.4  MCH 26.8  --  26.8 26.7  MCHC 30.5  --  30.4 30.9  RDW 19.4*  --  19.0* 18.7*  LYMPHSABS 2.0  --   --   --   MONOABS 1.2*  --   --   --   EOSABS 0.5  --   --   --   BASOSABS 0.1  --   --   --     Recent Labs  Lab 03/08/21 1930 03/08/21 2003 03/09/21 0330 03/10/21 0116  NA 136 136 134* 131*  K 4.2 4.1 3.6 3.8  CL 102 100 102 101  CO2 24  --  25 23  GLUCOSE 108* 107* 129* 130*  BUN 10 9 6* 13  CREATININE 0.78 0.70  0.65 0.76  CALCIUM 8.8*  --  8.4* 8.7*  AST 18  --   --   --   ALT 11  --   --   --   ALKPHOS 71  --   --   --   BILITOT 0.4  --   --   --   ALBUMIN 3.7  --   --   --   INR 1.0  --   --   --              Radiology Studies: CT ANGIO HEAD NECK W WO CM  Result Date: 03/09/2021 CLINICAL DATA:  Initial evaluation for acute TIA. EXAM: CT ANGIOGRAPHY HEAD AND NECK TECHNIQUE: Multidetector CT imaging of the head and neck was performed using the standard protocol during bolus administration of intravenous contrast. Multiplanar CT image reconstructions and MIPs were obtained to evaluate the vascular anatomy. Carotid stenosis measurements (when applicable) are obtained utilizing NASCET criteria, using the distal internal carotid diameter as the denominator. RADIATION  DOSE REDUCTION: This exam was performed according to the departmental dose-optimization program which includes automated exposure control, adjustment of the mA and/or kV according to patient size and/or use of iterative reconstruction technique. CONTRAST:  26mL OMNIPAQUE IOHEXOL 350 MG/ML SOLN COMPARISON:  Prior head CT from 03/08/2021. FINDINGS: CTA NECK FINDINGS Aortic arch: Visualized aortic arch normal caliber with normal branch pattern. Mild-to-moderate plaque about the arch itself. No significant stenosis about the origin of the great vessels. Right carotid system: Right common and internal carotid arteries patent without stenosis or dissection. Mild for age plaque about the right carotid bulb without stenosis. Left carotid system: Left common and internal carotid arteries patent without stenosis or dissection. Mild for age plaque about the left carotid bulb without stenosis. Vertebral arteries: Both vertebral arteries arise from subclavian arteries. No proximal subclavian artery stenosis. Vertebral arteries patent without stenosis or dissection. Skeleton: No discrete or worrisome osseous lesions. Other neck: No other acute soft tissue abnormality within the neck. Upper chest: Visualized upper chest demonstrates no acute finding. Review of the MIP images confirms the above findings CTA HEAD FINDINGS Anterior circulation: Both internal carotid arteries patent to the termini without stenosis. A1 segments patent bilaterally. Normal anterior communicating artery complex. Anterior cerebral arteries patent without stenosis. No M1 stenosis or occlusion. Normal MCA bifurcations. Distal MCA branches perfused and symmetric. Posterior circulation: Both V4 segments widely patent to the vertebrobasilar junction. Both PICA patent. Basilar widely patent to its distal aspect without stenosis. Superior cerebellar and posterior cerebral arteries widely patent bilaterally. Venous sinuses: Grossly patent allowing for timing the  contrast bolus and motion. Anatomic variants: None significant.  No aneurysm. Review of the MIP images confirms the above findings IMPRESSION: 1. Negative CTA of the head and neck. No large vessel occlusion, hemodynamically significant stenosis, or other acute vascular abnormality. 2. Mild for age atheromatous disease about the carotid bifurcations and carotid siphons without stenosis. Electronically Signed   By: Jeannine Boga M.D.   On: 03/09/2021 03:44   CT HEAD WO CONTRAST  Result Date: 03/08/2021 CLINICAL DATA:  Fall on 01/16, pain EXAM: CT HEAD WITHOUT CONTRAST CT CERVICAL SPINE WITHOUT CONTRAST TECHNIQUE: Multidetector CT imaging of the head and cervical spine was performed following the standard protocol without intravenous contrast. Multiplanar CT image reconstructions of the cervical spine were also generated. RADIATION DOSE REDUCTION: This exam was performed according to the departmental dose-optimization program which includes automated exposure control, adjustment of the mA and/or kV according to  patient size and/or use of iterative reconstruction technique. COMPARISON:  CT brain 04/01/2019, CT cervical spine 12/23/2017 FINDINGS: CT HEAD FINDINGS Brain: No acute territorial infarction, hemorrhage, or intracranial mass. Moderate atrophy. Moderate chronic small vessel ischemic changes of the white matter. Small chronic infarct in the right thalamic capsular region. Ventricle are stable to minimally enlarged compared to prior. Vascular: No hyperdense vessels.  Carotid vascular calcification Skull: Normal. Negative for fracture or focal lesion. Sinuses/Orbits: Mucosal thickening in the ethmoid maxillary and frontal sinuses Other: None CT CERVICAL SPINE FINDINGS Alignment: Straightening of the cervical spine. 3 mm anterolisthesis C4 on C5. Facet alignment within normal limits. Skull base and vertebrae: No acute fracture. No primary bone lesion or focal pathologic process. Soft tissues and spinal  canal: No prevertebral fluid or swelling. No visible canal hematoma. Disc levels: Degenerative changes at multiple levels. Partial ankylosis C5-C6. Marked disc space narrowing at C6-C7. Facet degenerative changes at multiple levels with foraminal stenosis. Upper chest: Negative. Other: None IMPRESSION: 1. No CT evidence for acute intracranial abnormality. Atrophy and chronic small vessel ischemic changes of the white matter. Ventricles slightly more prominent compared to prior head CT potentially due to slight atrophy progression. 2. Straightening of the cervical spine with degenerative changes. No acute osseous abnormality is seen. Electronically Signed   By: Donavan Foil M.D.   On: 03/08/2021 21:29   CT CERVICAL SPINE WO CONTRAST  Result Date: 03/08/2021 CLINICAL DATA:  Fall on 01/16, pain EXAM: CT HEAD WITHOUT CONTRAST CT CERVICAL SPINE WITHOUT CONTRAST TECHNIQUE: Multidetector CT imaging of the head and cervical spine was performed following the standard protocol without intravenous contrast. Multiplanar CT image reconstructions of the cervical spine were also generated. RADIATION DOSE REDUCTION: This exam was performed according to the departmental dose-optimization program which includes automated exposure control, adjustment of the mA and/or kV according to patient size and/or use of iterative reconstruction technique. COMPARISON:  CT brain 04/01/2019, CT cervical spine 12/23/2017 FINDINGS: CT HEAD FINDINGS Brain: No acute territorial infarction, hemorrhage, or intracranial mass. Moderate atrophy. Moderate chronic small vessel ischemic changes of the white matter. Small chronic infarct in the right thalamic capsular region. Ventricle are stable to minimally enlarged compared to prior. Vascular: No hyperdense vessels.  Carotid vascular calcification Skull: Normal. Negative for fracture or focal lesion. Sinuses/Orbits: Mucosal thickening in the ethmoid maxillary and frontal sinuses Other: None CT CERVICAL  SPINE FINDINGS Alignment: Straightening of the cervical spine. 3 mm anterolisthesis C4 on C5. Facet alignment within normal limits. Skull base and vertebrae: No acute fracture. No primary bone lesion or focal pathologic process. Soft tissues and spinal canal: No prevertebral fluid or swelling. No visible canal hematoma. Disc levels: Degenerative changes at multiple levels. Partial ankylosis C5-C6. Marked disc space narrowing at C6-C7. Facet degenerative changes at multiple levels with foraminal stenosis. Upper chest: Negative. Other: None IMPRESSION: 1. No CT evidence for acute intracranial abnormality. Atrophy and chronic small vessel ischemic changes of the white matter. Ventricles slightly more prominent compared to prior head CT potentially due to slight atrophy progression. 2. Straightening of the cervical spine with degenerative changes. No acute osseous abnormality is seen. Electronically Signed   By: Donavan Foil M.D.   On: 03/08/2021 21:29   MR BRAIN WO CONTRAST  Result Date: 03/09/2021 CLINICAL DATA:  88 year old male status post fall on January 16th. Pain and altered mental status. TIA. EXAM: MRI HEAD WITHOUT CONTRAST TECHNIQUE: Multiplanar, multiecho pulse sequences of the brain and surrounding structures were obtained without intravenous contrast. COMPARISON:  Brain MRI 04/23/2018. FINDINGS: The examination had to be discontinued prior to completion by patient request. Only coronal T2 weighted imaging was not obtained, although some sequences are mildly degraded by motion. Brain: No restricted diffusion or evidence of acute infarction. Moderate T2 heterogeneity throughout the bilateral deep gray matter nuclei, appears to be combination of enlarged perivascular spaces and chronic lacunar infarcts. Left thalamic lacune is chronic but new since 2020. Chronic right posterior deep white matter capsule chronic lacunar ischemia. And there is mild chronic Wallerian degeneration in the right brainstem. No  cortical encephalomalacia or definite chronic cerebral blood products. Moderate Patchy and confluent bilateral cerebral white matter T2 and FLAIR hyperintensity also appears increased since 2020. Vascular: Major intracranial vascular flow voids are stable. Mild generalized intracranial artery ectasia. Skull and upper cervical spine: Negative visible cervical spine. Normal bone marrow signal. Sinuses/Orbits: Orbits are stable and negative. Chronic paranasal sinus disease, mildly improved since 2020. Other: Mastoid air cells are well aerated, with regressed left side mastoid effusion since 2020. negative visible scalp and face. IMPRESSION: 1. No acute intracranial abnormality. The examinationIs mildly motion degraded, and no coronal T2 weighted imaging was obtained. 2. Moderate for age chronic small vessel disease, progressed in the left thalamus and bilateral cerebral white matter since 2020. Electronically Signed   By: Genevie Ann M.D.   On: 03/09/2021 04:35   CT T-SPINE NO CHARGE  Result Date: 03/08/2021 CLINICAL DATA:  Low back pain EXAM: CT THORACIC AND LUMBAR SPINE WITHOUT CONTRAST TECHNIQUE: Multidetector CT imaging of the thoracic and lumbar spine was performed without contrast. Multiplanar CT image reconstructions were also generated. RADIATION DOSE REDUCTION: This exam was performed according to the departmental dose-optimization program which includes automated exposure control, adjustment of the mA and/or kV according to patient size and/or use of iterative reconstruction technique. COMPARISON:  None. FINDINGS: CT THORACIC SPINE FINDINGS Alignment: Normal Vertebrae: Slight compression deformity at T12 compatible with slight compression fracture. No additional acute bony abnormality or focal bone lesion. Paraspinal and other soft tissues: Normal Disc levels: Early disc space narrowing and spurring. CT LUMBAR SPINE FINDINGS Segmentation: 5 lumbar type vertebrae. Alignment: No subluxation. Convex rightward  scoliosis centered in the upper to mid lumbar spine. Vertebrae: No fracture or focal bone lesion. Paraspinal and other soft tissues: Normal Disc levels: Advanced diffuse degenerative disc disease and facet disease. IMPRESSION: CT THORACIC SPINE IMPRESSION Slight compression fracture at T12. No retropulsed fracture fragments. CT LUMBAR SPINE IMPRESSION No acute bony abnormality. Convex rightward scoliosis. Moderate to advanced degenerative disc and facet disease. Electronically Signed   By: Rolm Baptise M.D.   On: 03/08/2021 21:25   CT L-SPINE NO CHARGE  Result Date: 03/08/2021 CLINICAL DATA:  Low back pain EXAM: CT THORACIC AND LUMBAR SPINE WITHOUT CONTRAST TECHNIQUE: Multidetector CT imaging of the thoracic and lumbar spine was performed without contrast. Multiplanar CT image reconstructions were also generated. RADIATION DOSE REDUCTION: This exam was performed according to the departmental dose-optimization program which includes automated exposure control, adjustment of the mA and/or kV according to patient size and/or use of iterative reconstruction technique. COMPARISON:  None. FINDINGS: CT THORACIC SPINE FINDINGS Alignment: Normal Vertebrae: Slight compression deformity at T12 compatible with slight compression fracture. No additional acute bony abnormality or focal bone lesion. Paraspinal and other soft tissues: Normal Disc levels: Early disc space narrowing and spurring. CT LUMBAR SPINE FINDINGS Segmentation: 5 lumbar type vertebrae. Alignment: No subluxation. Convex rightward scoliosis centered in the upper to mid lumbar spine. Vertebrae: No fracture or  focal bone lesion. Paraspinal and other soft tissues: Normal Disc levels: Advanced diffuse degenerative disc disease and facet disease. IMPRESSION: CT THORACIC SPINE IMPRESSION Slight compression fracture at T12. No retropulsed fracture fragments. CT LUMBAR SPINE IMPRESSION No acute bony abnormality. Convex rightward scoliosis. Moderate to advanced  degenerative disc and facet disease. Electronically Signed   By: Rolm Baptise M.D.   On: 03/08/2021 21:25   EEG adult  Result Date: 03/09/2021 Derek Jack, MD     03/09/2021 12:54 PM Routine EEG Report EISA NECAISE is a 88 y.o. male with a history of transient expressive aphasia who is undergoing an EEG to evaluate for seizures. Report: This EEG was acquired with electrodes placed according to the International 10-20 electrode system (including Fp1, Fp2, F3, F4, C3, C4, P3, P4, O1, O2, T3, T4, T5, T6, A1, A2, Fz, Cz, Pz). The following electrodes were missing or displaced: none. The occipital dominant rhythm was 5-6 Hz. This activity is reactive to stimulation. Drowsiness was manifested by background fragmentation; deeper stages of sleep were identified by K complexes and sleep spindles. There was no focal slowing. There were no interictal epileptiform discharges. Photic stimulation and hyperventilation were not performed. Impression and clinical correlation: This EEG was obtained while awake and asleep and is abnormal due to moderate diffuse slowing indicative of global cerebral dysfunction. Epileptiform abnormalities were not seen during this recording. Su Monks, MD [facility] [phone] If 7pm- 7am, please page neurology on call as listed in [place].   CT CHEST ABDOMEN PELVIS WO CONTRAST  Result Date: 03/08/2021 CLINICAL DATA:  Fall on 03/03/2021.  Low back pain.  Abdominal pain. EXAM: CT CHEST, ABDOMEN AND PELVIS WITHOUT CONTRAST TECHNIQUE: Multidetector CT imaging of the chest, abdomen and pelvis was performed following the standard protocol without IV contrast. RADIATION DOSE REDUCTION: This exam was performed according to the departmental dose-optimization program which includes automated exposure control, adjustment of the mA and/or kV according to patient size and/or use of iterative reconstruction technique. COMPARISON:  08/18/2020 FINDINGS: CT CHEST FINDINGS Cardiovascular:  Heart is normal size. Aorta is normal caliber. Scattered aortic and coronary artery calcifications. Mediastinum/Nodes: No mediastinal, hilar, or axillary adenopathy. Trachea and esophagus are unremarkable. Thyroid unremarkable. Moderate-sized hiatal hernia. Lungs/Pleura: Lungs are clear. No focal airspace opacities or suspicious nodules. No effusions. Musculoskeletal: No acute bony abnormality. CT ABDOMEN PELVIS FINDINGS Hepatobiliary: No focal hepatic abnormality. Gallbladder unremarkable. Pancreas: No focal abnormality or ductal dilatation. Spleen: Prior splenectomy. Adrenals/Urinary Tract: No adrenal abnormality. No focal renal abnormality. No stones or hydronephrosis. Urinary bladder is unremarkable. Stomach/Bowel: Colonic diverticulosis. No active diverticulitis. No bowel obstruction. Stomach and small bowel grossly unremarkable. Vascular/Lymphatic: Aortic atherosclerosis. No evidence of aneurysm or adenopathy. Reproductive: Mildly prominent prostate with central calcifications. Other: No free fluid or free air. Small left inguinal hernia containing fat. Musculoskeletal: Rightward scoliosis in the lumbar spine. No acute bony abnormality. IMPRESSION: No acute findings in the chest, abdomen or pelvis. Moderate-sized hiatal hernia. Coronary artery disease, aortic atherosclerosis. Colonic diverticulosis. Electronically Signed   By: Rolm Baptise M.D.   On: 03/08/2021 21:20    Scheduled Meds:  aspirin EC  81 mg Oral Daily   budesonide  2 mL Nebulization Daily   melatonin  5 mg Oral QHS   pantoprazole  40 mg Oral Q breakfast   predniSONE  5 mg Oral Daily   rOPINIRole  3 mg Oral QHS   simvastatin  40 mg Oral QHS   Continuous Infusions:   LOS: 0 days  Time spent: 35 minutes  Signature  Lala Lund M.D on 03/10/2021 at 8:53 AM   -  To page go to [email or web address]

## 2021-03-10 NOTE — NC FL2 (Signed)
[place] LEVEL OF CARE SCREENING TOOL     IDENTIFICATION  Patient Name: [name] Birthdate: 03-Apr-1931 Sex: male Admission Date (Current Location): 03/08/2021  [facility] and [place] Number:  Herbalist and Address:  [facility], [place] [address] [place], [place] [address]      Provider Number: [id number]  Attending Physician Name and Address:  Thurnell Lose, MD  Relative Name and Phone Number:       Current Level of Care: Hospital Recommended Level of Care: [place] Prior Approval Number:    Date Approved/Denied:   PASRR Number: [id number]  Discharge Plan: SNF    Current Diagnoses: Patient Active Problem List   Diagnosis Date Noted   Expressive aphasia 03/08/2021   T12 compression fracture, initial encounter ([place]) 03/08/2021   Gastric AVM    Dysphagia    AVM (arteriovenous malformation) of small bowel, acquired with hemorrhage    Acute GI bleeding 01/06/2020   Protein-calorie malnutrition, severe ([place]) 04/01/2019   DNR (do not resuscitate) 04/01/2019   Diverticulosis large intestine w/o perforation or abscess w/bleeding    GERD (gastroesophageal reflux disease) 04/21/2018   Diverticulosis    Acute blood loss anemia 12/24/2017   Syncope due to orthostatic hypotension 12/23/2017   Chronic combined systolic and diastolic CHF (congestive heart failure) ([address] [address]) 12/23/2017   Postural dizziness with presyncope 10/25/2016   GI bleed 10/25/2016   BPPV (benign paroxysmal positional vertigo) 10/25/2016   Lower GI bleed 10/24/2016   Syncope 10/24/2016   TIA (transient ischemic attack) 07/22/2016   Benign essential HTN    PAF (paroxysmal atrial fibrillation) (HCC)    Stroke-like symptom 02/22/2016   Leukocytosis 02/22/2016   Acute lower GI bleeding 02/01/2016   History of lower GI bleeding Dec 2017 02/01/2016   Chronic anticoagulation 2/2 history of CVA 01/31/2016   History of CVA (cerebrovascular  accident) 01/31/2016   Sinusitis, chronic 12/12/2015   Cough variant asthma 10/31/2015   Insomnia 07/25/2015   HLD (hyperlipidemia) 02/03/2014   Ulnar neuropathy at elbow of right upper extremity 02/03/2014   Cervical radiculopathy 02/03/2014   Seizures ([place])    Diabetes mellitus type II, non insulin dependent ([place]) 12/22/2013   Recurrent ventral hernia 12/15/2011   History of esophageal strciture    Diverticulosis of colon with hemorrhage 06/24/2007    Orientation RESPIRATION BLADDER Height & Weight     Self, Place  Normal Incontinent, External catheter Weight: 144 lb 10 oz (65.6 kg) Height:  5\' 6"  (167.6 cm)  BEHAVIORAL SYMPTOMS/MOOD NEUROLOGICAL BOWEL NUTRITION STATUS      Continent Diet (see dc summary)  AMBULATORY STATUS COMMUNICATION OF NEEDS Skin   Limited Assist Verbally Normal                       Personal Care Assistance Level of Assistance  Bathing, Feeding, Dressing Bathing Assistance: Limited assistance Feeding assistance: Independent Dressing Assistance: Limited assistance     Functional Limitations Info  Sight, Hearing Sight Info: Impaired Hearing Info: Impaired      SPECIAL CARE FACTORS FREQUENCY  PT (By licensed PT), OT (By licensed OT)     PT Frequency: 5x/week OT Frequency: 5x/week            Contractures Contractures Info: Not present    Additional Factors Info  Code Status, Allergies Code Status Info: Full Allergies Info: Tape           Current Medications (03/10/2021):  This  is the current hospital active medication list Current Facility-Administered Medications  Medication Dose Route Frequency Provider Last Rate Last Admin   acetaminophen (TYLENOL) tablet 650 mg  650 mg Oral Q4H PRN Lenore Cordia, MD   650 mg at 03/09/21 2119   Or   acetaminophen (TYLENOL) 160 MG/5ML solution 650 mg  650 mg Per Tube Q4H PRN Lenore Cordia, MD       Or   acetaminophen (TYLENOL) suppository 650 mg  650 mg Rectal Q4H PRN Lenore Cordia,  MD   650 mg at 03/09/21 0949   albuterol (PROVENTIL) (2.5 MG/3ML) 0.083% nebulizer solution 3 mL  3 mL Inhalation Q6H PRN Lenore Cordia, MD       aspirin EC tablet 81 mg  81 mg Oral Daily Zada Finders R, MD   81 mg at 03/10/21 0911   budesonide (PULMICORT) nebulizer solution 0.5 mg  2 mL Nebulization Daily Lenore Cordia, MD       cholecalciferol (VITAMIN D) tablet 1,000 Units  1,000 Units Oral Daily Thurnell Lose, MD   1,000 Units at 03/10/21 1111   lip balm (CARMEX) ointment   Topical PRN Thurnell Lose, MD       melatonin tablet 5 mg  5 mg Oral QHS Zada Finders R, MD   5 mg at 03/09/21 2119   ondansetron (ZOFRAN) injection 4 mg  4 mg Intravenous Q6H PRN Pahwani, Rinka R, MD       pantoprazole (PROTONIX) EC tablet 40 mg  40 mg Oral Q breakfast Zada Finders R, MD   40 mg at 03/10/21 0911   predniSONE (DELTASONE) tablet 5 mg  5 mg Oral Daily Zada Finders R, MD   5 mg at 03/10/21 0911   rOPINIRole (REQUIP) tablet 3 mg  3 mg Oral QHS Zada Finders R, MD   3 mg at 03/09/21 2118   senna-docusate (Senokot-S) tablet 1 tablet  1 tablet Oral QHS PRN Lenore Cordia, MD       simvastatin (ZOCOR) tablet 40 mg  40 mg Oral QHS Zada Finders R, MD   40 mg at 03/09/21 2119   traMADol (ULTRAM) tablet 50 mg  50 mg Oral Daily PRN Lenore Cordia, MD   50 mg at 03/10/21 7106   vitamin B-12 (CYANOCOBALAMIN) tablet 100 mcg  100 mcg Oral Daily Thurnell Lose, MD   100 mcg at 03/10/21 1111   zonisamide (ZONEGRAN) capsule 100 mg  100 mg Oral Daily Thurnell Lose, MD   100 mg at 03/10/21 1111     Discharge Medications: Please see discharge summary for a list of discharge medications.  Relevant Imaging Results:  Relevant Lab Results:   Additional Information SS#[id number]. Moderna vaccine 12/20/19  Benard Halsted, LCSW

## 2021-03-10 NOTE — TOC Initial Note (Addendum)
Transition of Care [facility]) - Initial/Assessment Note    Patient Details  Name: [name] MRN: [id number] Date of Birth: Mar 02, 1931  [facility] CM/SW Contact:    Benard Halsted, LCSW Phone Number: 03/10/2021, 9:41 AM  Clinical Narrative:                 9:41am-CSW spoke with patient's spouse. She requested CSW follow up with her once she arrives home in 20 minutes or when she gets to the hospital around 11am.   Of note, they reside in [facility] and [facility] can accept patient on their SNF side if needed per Dian Situ.   1pm-CSW met with spouse and daughter in patient's room. They are requesting rehab at [facility]. CSW initiated insurance authorization, Ref# [id number].  Patient will go to PB-[facility], room 104. No updated COVID test required.    Barriers to Discharge: Continued Medical Work up   Patient Goals and CMS Choice Patient states their goals for this hospitalization and ongoing recovery are:: Rehab CMS Medicare.gov Compare Post Acute Care list provided to:: Patient Represenative (must comment) Choice offered to / list presented to : Spouse  Expected Discharge Plan and Services   In-house Referral: Clinical Social Work     Living arrangements for the past 2 months: [place] ([facility])                                      Prior Living Arrangements/Services Living arrangements for the past 2 months: [place] ([facility]) Lives with:: Spouse Patient language and need for interpreter reviewed:: Yes Do you feel safe going back to the place where you live?: Yes      Need for Family Participation in Patient Care: Yes (Comment) Care giver support system in place?: Yes (comment) Current home services: DME Criminal Activity/Legal Involvement Pertinent to Current Situation/Hospitalization: No - Comment as needed  Activities of Daily Living      Permission Sought/Granted Permission  sought to share information with : Facility Sport and exercise psychologist, Family Supports Permission granted to share information with : No  Share Information with NAME: [name]  Permission granted to share info w AGENCY: SNFs  Permission granted to share info w Relationship: Spouse  Permission granted to share info w Contact Information: [phone]  Emotional Assessment Appearance:: Appears stated age Attitude/Demeanor/Rapport: Unable to Assess Affect (typically observed): Unable to Assess Orientation: : Oriented to Self, Oriented to Place Alcohol / Substance Use: Not Applicable Psych Involvement: No (comment)  Admission diagnosis:  TIA (transient ischemic attack) [G45.9] Expressive aphasia [R47.01] Patient Active Problem List   Diagnosis Date Noted   Expressive aphasia 03/08/2021   T12 compression fracture, initial encounter ([place]) 03/08/2021   Gastric AVM    Dysphagia    AVM (arteriovenous malformation) of small bowel, acquired with hemorrhage    Acute GI bleeding 01/06/2020   Protein-calorie malnutrition, severe ([place]) 04/01/2019   DNR (do not resuscitate) 04/01/2019   Diverticulosis large intestine w/o perforation or abscess w/bleeding    GERD (gastroesophageal reflux disease) 04/21/2018   Diverticulosis    Acute blood loss anemia 12/24/2017   Syncope due to orthostatic hypotension 12/23/2017   Chronic combined systolic and diastolic CHF (congestive heart failure) ([place]) 12/23/2017   Postural dizziness with presyncope 10/25/2016   GI bleed 10/25/2016   BPPV (benign paroxysmal positional vertigo) 10/25/2016   Lower GI bleed 10/24/2016   Syncope 10/24/2016  TIA (transient ischemic attack) 07/22/2016   Benign essential HTN    PAF (paroxysmal atrial fibrillation) (HCC)    Stroke-like symptom 02/22/2016   Leukocytosis 02/22/2016   Acute lower GI bleeding 02/01/2016   History of lower GI bleeding Dec 2017 02/01/2016   Chronic anticoagulation 2/2 history of CVA 01/31/2016    History of CVA (cerebrovascular accident) 01/31/2016   Sinusitis, chronic 12/12/2015   Cough variant asthma 10/31/2015   Insomnia 07/25/2015   HLD (hyperlipidemia) 02/03/2014   Ulnar neuropathy at elbow of right upper extremity 02/03/2014   Cervical radiculopathy 02/03/2014   Seizures ([place])    Diabetes mellitus type II, non insulin dependent ([place]) 12/22/2013   Recurrent ventral hernia 12/15/2011   History of esophageal strciture    Diverticulosis of colon with hemorrhage 06/24/2007   PCP:  Seward Carol, MD Pharmacy:   [facility] [place] - [address] [place] [address] Phone: [phone] Fax: [phone]     Social Determinants of Health (SDOH) Interventions    Readmission Risk Interventions No flowsheet data found.

## 2021-03-10 NOTE — Evaluation (Signed)
Speech Language Pathology Evaluation Patient Details Name: [name] MRN: [id number] DOB: 01/02/1932 Today's Date: 03/10/2021 Time: 4098-1191 SLP Time Calculation (min) (ACUTE ONLY): 35 min  Problem List:  Patient Active Problem List   Diagnosis Date Noted   Expressive aphasia 03/08/2021   T12 compression fracture, initial encounter ([place]) 03/08/2021   Gastric AVM    Dysphagia    AVM (arteriovenous malformation) of small bowel, acquired with hemorrhage    Acute GI bleeding 01/06/2020   Protein-calorie malnutrition, severe ([place]) 04/01/2019   DNR (do not resuscitate) 04/01/2019   Diverticulosis large intestine w/o perforation or abscess w/bleeding    GERD (gastroesophageal reflux disease) 04/21/2018   Diverticulosis    Acute blood loss anemia 12/24/2017   Syncope due to orthostatic hypotension 12/23/2017   Chronic combined systolic and diastolic CHF (congestive heart failure) ([place]) 12/23/2017   Postural dizziness with presyncope 10/25/2016   GI bleed 10/25/2016   BPPV (benign paroxysmal positional vertigo) 10/25/2016   Lower GI bleed 10/24/2016   Syncope 10/24/2016   TIA (transient ischemic attack) 07/22/2016   Benign essential HTN    PAF (paroxysmal atrial fibrillation) (HCC)    Stroke-like symptom 02/22/2016   Leukocytosis 02/22/2016   Acute lower GI bleeding 02/01/2016   History of lower GI bleeding Dec 2017 02/01/2016   Chronic anticoagulation 2/2 history of CVA 01/31/2016   History of CVA (cerebrovascular accident) 01/31/2016   Sinusitis, chronic 12/12/2015   Cough variant asthma 10/31/2015   Insomnia 07/25/2015   HLD (hyperlipidemia) 02/03/2014   Ulnar neuropathy at elbow of right upper extremity 02/03/2014   Cervical radiculopathy 02/03/2014   Seizures ([place])    Diabetes mellitus type II, non insulin dependent ([place]) 12/22/2013   Recurrent ventral hernia 12/15/2011   History of esophageal strciture    Diverticulosis of colon with hemorrhage 06/24/2007   Past  Medical History:  Past Medical History:  Diagnosis Date   Acute CVA (cerebrovascular accident) ([place]) 07/25/2015   Asthma    Benign essential HTN    Cerebral infarction due to embolism of left middle cerebral artery ([place]) 02/03/2014   Chronic combined systolic and diastolic CHF (congestive heart failure) ([place]) 12/23/2017   Colitis    Colon polyps    Diabetes mellitus type 2, diet-controlled ([place]) 12/22/2013   Diverticulosis    GERD (gastroesophageal reflux disease)    GI bleed    GI bleed    Hemorrhoids    Hiatal hernia    History of esophageal strciture    HLD (hyperlipidemia)    Osteoarthritis    Proctitis    Status post dilation of esophageal narrowing    Stroke [facility])    Past Surgical History:  Past Surgical History:  Procedure Laterality Date   ABDOMINAL HERNIA REPAIR     BIOPSY  04/05/2019   Procedure: BIOPSY;  Surgeon: Thornton Herder, MD;  Location: [place];  Service: Gastroenterology;;   COLONOSCOPY     multiple   COLONOSCOPY WITH PROPOFOL N/A 12/12/2016   Procedure: COLONOSCOPY WITH PROPOFOL;  Surgeon: Gatha Mayer, MD;  [facility] END[facility]COPY;  Service: Endoscopy;  Laterality: N/A;   COLONOSCOPY WITH PROPOFOL N/A 04/04/2019   Procedure: COLONOSCOPY WITH PROPOFOL;  Surgeon: Milus Banister, MD;  Location: [facility] ENDOSCOPY;  Service: Endoscopy;  Laterality: N/A;   ENTEROSCOPY N/A 01/16/2020   Procedure: ENTEROSCOPY;  Surgeon: Rush Landmark Telford Nab., MD;  Location[facility];  Service: Gastroenterology;  Laterality: N/A;   ENTEROSCOPY N/A 01/20/2020   Procedure: ENTEROSCOPY;  Surgeon: Rush Landmark Telford Nab., MD;  Location: [facility] ENDOSCOPY;  Service: Gastroenterology;  Laterality: N/A;   ENTEROSCOPY N/A 09/16/2020   Procedure: ENTEROSCOPY;  Surgeon: Ladene Artist, MD;  Location: [facility] ENDOSCOPY;  Service: Endoscopy;  Laterality: N/A;  push enteroscopy   ESOPHAGOGASTRODUODENOSCOPY     multiple   ESOPHAGOGASTRODUODENOSCOPY (EGD) WITH PROPOFOL N/A 04/05/2019   Procedure:  ESOPHAGOGASTRODUODENOSCOPY (EGD) WITH PROPOFOL;  Surgeon: Thornton Wisz, MD;  Location: [place];  Service: Gastroenterology;  Laterality: N/A;   ESOPHAGOGASTRODUODENOSCOPY (EGD) WITH PROPOFOL N/A 01/18/2020   Procedure: ESOPHAGOGASTRODUODENOSCOPY (EGD) WITH PROPOFOL;  Surgeon: Doran Stabler, MD;  Location: [facility] ENDOSCOPY;  Service: Gastroenterology;  Laterality: N/A;  For placement of video capsule study   GIVENS CAPSULE STUDY N/A 01/16/2020   Procedure: [place];  Surgeon: Irving Copas., MD;  Location: [facility] ENDOSCOPY;  Service: Gastroenterology;  Laterality: N/A;   GIVENS CAPSULE STUDY N/A 01/18/2020   Procedure: GIVENS CAPSULE STUDY;  Surgeon: Doran Stabler, MD;  Location: [facility] ENDOSCOPY;  Service: Gastroenterology;  Laterality: N/A;   HOT HEMOSTASIS N/A 01/20/2020   Procedure: HOT HEMOSTASIS (ARGON PLASMA COAGULATION/BICAP);  Surgeon: Irving Copas., MD;  Location: [facility];  Service: Gastroenterology;  Laterality: N/A;   HOT HEMOSTASIS N/A 09/16/2020   Procedure: HOT HEMOSTASIS (ARGON PLASMA COAGULATION/BICAP);  Surgeon: Ladene Artist, MD;  Location: [facility];  Service: Endoscopy;  Laterality: N/A;   INSERTION OF MESH  01/29/2012   Procedure: INSERTION OF MESH;  Surgeon: Gwenyth Ober, MD;  Location: [place];  Service: General;  Laterality: N/A;   IR ANGIOGRAM SELECTIVE EACH ADDITIONAL VESSEL  04/01/2019   IR ANGIOGRAM VISCERAL SELECTIVE  04/01/2019   IR ANGIOGRAM VISCERAL SELECTIVE  04/01/2019   IR ANGIOGRAM VISCERAL SELECTIVE  04/01/2019   IR US GUIDE VASC ACCESS RIGHT  1/61/0960   NISSEN FUNDOPLICATION     SAVORY DILATION N/A 01/16/2020   Procedure: SAVORY DILATION;  Surgeon: Irving Copas., MD;  Location: [facility] ENDOSCOPY;  Service: Gastroenterology;  Laterality: N/A;   SAVORY DILATION N/A 09/16/2020   Procedure: SAVORY DILATION;  Surgeon: Ladene Artist, MD;  Location: [facility] ENDOSCOPY;  Service: Endoscopy;  Laterality: N/A;   SCLEROTHERAPY   01/20/2020   Procedure: SCLEROTHERAPY;  Surgeon: Mansouraty, Telford Nab., MD;  Location: [facility];  Service: Gastroenterology;;   SPLENECTOMY, TOTAL     nontraumatic rupture   SUBMUCOSAL TATTOO INJECTION  01/16/2020   Procedure: SUBMUCOSAL TATTOO INJECTION;  Surgeon: Irving Copas., MD;  Location: [facility];  Service: Gastroenterology;;   VENTRAL HERNIA REPAIR  01/29/2012    WITH MESH   VENTRAL HERNIA REPAIR  01/29/2012   Procedure: HERNIA REPAIR VENTRAL ADULT;  Surgeon: Gwenyth Ober, MD;  Location: [place];  Service: General;  Laterality: N/A;  open recurrent ventral hernia repair with mesh   HPI:  88yo male admitted 03/08/21 with abnormal speech/ transient expressive aphasia. PMH: CVA, recurrent GI bleed, PAFib, esophageal stenosis, sCHF, DM2, HLD, asthma, chronic prednisone use. CTHead and MRI = no acute intracranial abnormality   Assessment / Plan / Recommendation Clinical Impression  Pt seen at bedside for assessment of speech, language, and cognition. Pt is significantly hard of hearing, which adversely affects effective communication. Pt speech is fully intelligible without dysarthria. Pt able to answer simple yes/no questions, but was inaccurate for more complex/abstract questions. He was able to follow 1- and 2-step verbal directions, but nothing more complex. Expressively, pt is able to complete automatic sequences, repeat sentence length material, and complete responsive and confrontation naming tasks. Pt's  deficits appear more related to cognitive impairment exacerbated by hearing loss. Pt was inaccurate for year (1998), day/date, but was accurate for the month. He is aware he is in the hospital, but reports current location as [place]. Pt able to repeat 3 unrelated words after multiple practice trials, but was then able to recall only 1/3 after a distraction and delay. Pt was able to spell WORLD correctly, but perseverated when asked to spell it backwards, unable to  complete that task. Pt's score on MMSE was 13/27 (reading, writing, and copying tasks not administered at this time). No family present to discuss level of function prior to admit, however, given negative CT/MRI, baseline deficits are anticipated. At this time, close 24 hour supervision at discharge is recommended to maximize safety. Recommend follow up at next level of care for skilled SLP services to provide ongoing education and establish routines in pt's familiar environment. Acute services signing off.    SLP Assessment  SLP Recommendation/Assessment: All further Speech Language Pathology needs can be addressed in the next venue of care  SLP Visit Diagnosis: Cognitive communication deficit (R41.841)    Recommendations for follow up therapy are one component of a multi-disciplinary discharge planning process, led by the attending physician.  Recommendations may be updated based on patient status, additional functional criteria and insurance authorization.    Follow Up Recommendations  Skilled nursing-short term rehab (<3 hours/day)    Assistance Recommended at Discharge  Frequent or constant Supervision/Assistance  Functional Status Assessment Patient has not had a recent decline in their functional status     SLP Evaluation Cognition  Overall Cognitive Status: No family/caregiver present to determine baseline cognitive functioning Arousal/Alertness: Awake/alert Orientation Level: Oriented to person;Oriented to place;Disoriented to time;Disoriented to situation       Comprehension  Auditory Comprehension Overall Auditory Comprehension: Impaired Yes/No Questions: Impaired Basic Biographical Questions: 76-100% accurate Basic Immediate Environment Questions: 75-100% accurate Complex Questions: 25-49% accurate Commands: Impaired One Step Basic Commands: 75-100% accurate Two Step Basic Commands: 25-49% accurate Conversation: Simple Interfering Components:  Hearing EffectiveTechniques: Increased volume;Repetition;Pausing;Slowed speech;Stressing words Visual Recognition/Discrimination Discrimination: Not tested Reading Comprehension Reading Status: Not tested    Expression Expression Primary Mode of Expression: Verbal Verbal Expression Overall Verbal Expression: Impaired Initiation: No impairment Automatic Speech: Name;Social Response Level of Generative/Spontaneous Verbalization: Sentence Repetition: No impairment Naming: No impairment Pragmatics: No impairment Non-Verbal Means of Communication: Not applicable Written Expression Dominant Hand: Right Written Expression: Not tested   Oral / Motor  Oral Motor/Sensory Function Overall Oral Motor/Sensory Function: Within functional limits Motor Speech Overall Motor Speech: Appears within functional limits for tasks assessed Intelligibility: Intelligible Motor Planning: Witnin functional limits           Tomeeka Plaugher B. Quentin Ore, [facility] [place] Speech Language Pathologist Office: [phone]  Shonna Chock 03/10/2021, 10:26 AM

## 2021-03-10 NOTE — Evaluation (Signed)
Occupational Therapy Evaluation Patient Details Name: [name] MRN: [id number] DOB: [birth year] Today's Date: 03/10/2021   History of Present Illness Pt is an 88 y.o. male who presented 03/08/21 for abnormal speech and L-sided facial droop, whcih resolved. Being worked up for possible TIA. No CT or MRI evidence for acute intracranial abnormality or CT evidence of acute osseous abnormality in cervical spine. Radiographs of lumbar region revealed indeterminate age fractures of T10, T11, T12 and L4 new compared to prior CT scan of July 2022. PMH: CVA, recurrent GI bleed, paroxysmal atrial fibrillation not on anticoagulation, esophageal stenosis, chronic systolic CHF (last EF 53% 02/2016), T2DM, HLD, asthma, chronic prednisone use (on 5 mg daily for asthma and chronic bronchitis)   Clinical Impression   [name] was pleasantly confused this session, he was unable to recall PLOF or home set up information. He lives at an [facility] with his wife who is available to assist the majority of the time, he uses a rollator for in-home mobility and a PWC for community mobility. Upon evaluation pt required simple one step cues for all sequencing, safety and to adhere to back precautions. Overall he required up to mod A for mobility and mod A for ADLs. Per Dr. Wallace Keller, TLSO is to be worn when OOB and may be removed to shower and when resting in bed. Pt will benefit from OT acutely. Due to high assist levels, recommend d/c to SNF pending pt's ability to progress to mod I level acutely.    Recommendations for follow up therapy are one component of a multi-disciplinary discharge planning process, led by the attending physician.  Recommendations may be updated based on patient status, additional functional criteria and insurance authorization.   Follow Up Recommendations  Skilled nursing-short term rehab (<3 hours/day)    Assistance Recommended at Discharge Frequent or constant Supervision/Assistance  Patient can return home with  the following A little help with walking and/or transfers;A little help with bathing/dressing/bathroom;Direct supervision/assist for medications management    Functional Status Assessment  Patient has had a recent decline in their functional status and demonstrates the ability to make significant improvements in function in a reasonable and predictable amount of time.  Equipment Recommendations  None recommended by OT    Recommendations for Other Services       Precautions / Restrictions Precautions Precautions: Fall;Back Precaution Comments: L-residual weakness, wears AFO and knee brace per chart Required Braces or Orthoses: Spinal Brace Spinal Brace: Thoracolumbosacral orthotic;Applied in sitting position ("Just apply when he is out of bed can be taken off for showers" - Dr. Candiss Norse) Restrictions Weight Bearing Restrictions: No      Mobility Bed Mobility Overal bed mobility: Needs Assistance Bed Mobility: Rolling, Sidelying to Sit, Sit to Sidelying Rolling: Min assist Sidelying to sit: Min assist     Sit to sidelying: Min assist General bed mobility comments: minA to maintain back precautions throughout/ log rolling. Assit for trunk elevation    Transfers Overall transfer level: Needs assistance Equipment used: Rolling walker (2 wheels) Transfers: Sit to/from Stand Sit to Stand: Min assist           General transfer comment: min A to power into standing. mod A for ambulation.      Balance Overall balance assessment: Needs assistance Sitting-balance support: Feet supported Sitting balance-Leahy Scale: Fair Sitting balance - Comments: able to balance while sitting EOB, LOB with reachign out of BOS   Standing balance support: Bilateral upper extremity supported Standing balance-Leahy Scale: Poor Standing balance comment:  BUE supported on RW +therapist support                           ADL either performed or assessed with clinical judgement   ADL  Overall ADL's : Needs assistance/impaired Eating/Feeding: Independent;Sitting   Grooming: Minimal assistance;Standing   Upper Body Bathing: Minimal assistance;Sitting   Lower Body Bathing: Moderate assistance;Sit to/from stand   Upper Body Dressing : Supervision/safety;Sitting   Lower Body Dressing: Moderate assistance;Sit to/from stand Lower Body Dressing Details (indicate cue type and reason): able to doff bilat socks in sitting, min A to don socks over toes due to back pain Toilet Transfer: Moderate assistance;Ambulation;Rolling walker (2 wheels) Toilet Transfer Details (indicate cue type and reason): short ambulation Toileting- Clothing Manipulation and Hygiene: Minimal assistance;Sitting/lateral lean       Functional mobility during ADLs: Moderate assistance;Rolling walker (2 wheels) General ADL Comments: pt required verbal cues throughout for sequencing and safety.     Vision Baseline Vision/History: 0 No visual deficits Ability to See in Adequate Light: 0 Adequate Vision Assessment?: No apparent visual deficits     Perception     Praxis      Pertinent Vitals/Pain Pain Assessment Pain Assessment: Faces Faces Pain Scale: Hurts a little bit Pain Location: neck Pain Descriptors / Indicators: Sore Pain Intervention(s): Limited activity within patient's tolerance, Monitored during session     Hand Dominance Right   Extremity/Trunk Assessment Upper Extremity Assessment Upper Extremity Assessment: RUE deficits/detail;LUE deficits/detail;Generalized weakness RUE Deficits / Details: generalized weakness, limited over head ROM. RUE Sensation: WNL RUE Coordination: decreased fine motor LUE Deficits / Details: residual weakness from prior CVA. Overall [facility] for ADLs and tasks assessed with limited overhead ROM. LUE Sensation: decreased light touch LUE Coordination: decreased fine motor;decreased gross motor   Lower Extremity Assessment Lower Extremity Assessment: Defer  to PT evaluation   Cervical / Trunk Assessment Cervical / Trunk Assessment: Kyphotic;Other exceptions Cervical / Trunk Exceptions: Thoracic and lumbar fxs   Communication Communication Communication: No difficulties   Cognition Arousal/Alertness: Awake/alert Behavior During Therapy: WFL for tasks assessed/performed Overall Cognitive Status: Impaired/Different from baseline Area of Impairment: Orientation, Attention, Memory, Following commands, Safety/judgement, Awareness, Problem solving                 Orientation Level: Disoriented to, Time, Situation Current Attention Level: Focused Memory: Decreased short-term memory, Decreased recall of precautions Following Commands: Follows one step commands inconsistently, Follows one step commands with increased time Safety/Judgement: Decreased awareness of safety, Decreased awareness of deficits Awareness: Intellectual Problem Solving: Slow processing, Decreased initiation, Difficulty sequencing, Requires verbal cues, Requires tactile cues General Comments: Pt pleasantly confused, unable to comfirm PLOF or home set up information. Stating that he was with a "nurse" prior to coming to the hospital, living at "friends home." Stated the year as 1997. He does follow simple commands well with incrased time and cues. Unable to recall back precautions, even after review     General Comments  SpO2 reading 85% on monitor, poor pleth wave    Exercises     Shoulder Instructions      Home Living Family/patient expects to be discharged to:: Assisted living Living Arrangements: Spouse/significant other Available Help at Discharge: Family Type of Home: Independent living facility Home Access: Level entry     Home Layout: One level     Bathroom Shower/Tub: Occupational psychologist: Handicapped height Bathroom Accessibility: Yes   Home Equipment: Shower seat;Grab bars -  tub/shower;Grab bars - toilet;Rollator (4  wheels);Wheelchair - power;Other (comment);Hand held shower head;Adaptive equipment Adaptive Equipment: Reacher;Long-handled shoe horn Additional Comments: has a caregiver that comes in 2 days/week      Prior Functioning/Environment Prior Level of Function : History of Falls (last six months)             Mobility Comments: x1 fall in 6 months, which was earlier this January. Pt uses rollator mod I for household distances. Uses power w/c in community. ADLs Comments: Pt able to perform all ADLs mod I majority of time, with wife assisting intermittently as needed. Wife puts clothes out for him, but otherwise he dresses himself, even donns his AFO and knee brace mod I.        OT Problem List: Decreased strength;Decreased range of motion;Decreased activity tolerance;Impaired balance (sitting and/or standing);Decreased cognition;Decreased safety awareness;Decreased knowledge of precautions;Decreased knowledge of use of DME or AE;Pain      OT Treatment/Interventions: Self-care/ADL training;Therapeutic exercise;DME and/or AE instruction;Patient/family education;Balance training    OT Goals(Current goals can be found in the care plan section) Acute Rehab OT Goals Patient Stated Goal: did not state OT Goal Formulation: With patient Time For Goal Achievement: 03/24/21 Potential to Achieve Goals: Good ADL Goals Pt Will Perform Grooming: with modified independence;standing Pt Will Perform Lower Body Dressing: sit to/from stand;with set-up Pt Will Transfer to Toilet: with supervision;ambulating Pt Will Perform Toileting - Clothing Manipulation and hygiene: with modified independence;sitting/lateral leans Additional ADL Goal #1: Pt will indep recall 3/3 back precautions  OT Frequency: Min 2X/week       AM-PAC OT "6 Clicks" Daily Activity     Outcome Measure Help from another person eating meals?: None Help from another person taking care of personal grooming?: A Little Help from another  person toileting, which includes using toliet, bedpan, or urinal?: A Lot Help from another person bathing (including washing, rinsing, drying)?: A Lot Help from another person to put on and taking off regular upper body clothing?: A Little Help from another person to put on and taking off regular lower body clothing?: A Lot 6 Click Score: 16   End of Session Equipment Utilized During Treatment: Gait belt;Rolling walker (2 wheels);Back brace Nurse Communication: Mobility status  Activity Tolerance: Patient tolerated treatment well Patient left: in bed;with call bell/phone within reach;with bed alarm set  OT Visit Diagnosis: Unsteadiness on feet (R26.81);Other abnormalities of gait and mobility (R26.89);Muscle weakness (generalized) (M62.81);History of falling (Z91.81);Pain                Time: 9774-1423 OT Time Calculation (min): 16 min Charges:  OT General Charges $OT Visit: 1 Visit OT Evaluation $OT Eval Moderate Complexity: 1 Mod   [name] A Shawnna Pancake 03/10/2021, 9:10 AM

## 2021-03-10 NOTE — Progress Notes (Addendum)
STROKE TEAM PROGRESS NOTE   ATTENDING NOTE: I reviewed above note and agree with the assessment and plan. Pt was seen and examined.   RN at bedside.  Patient is more awake alert, lucid than yesterday.  Stated had good sleep last night.  Orientated to self, age, people, months and place, however not orientated to year.  Moderate dysarthria, however answer questions appropriately, follows simple commands.  Moving all extremities but still has mild left hemiparesis.  He stated that he only took [place] for short period time due to feeling drowsy sleepy groggy in the morning.  I discussed with him regarding switch Zonegran to Depakote 500 nightly and he is in agreement.  Continue aspirin 81 given GI bleeding.  Continue Zocor.  PT and OT recommend SNF.  For detailed assessment and plan, please refer to above as I have made changes wherever appropriate.   Neurology will sign off. Please call with questions. Pt will follow up with stroke clinic NP at [facility] in about 4 weeks. Thanks for the consult.   [name], MD PhD Stroke Neurology 03/10/2021 7:57 PM    INTERVAL HISTORY Patient seen and assessed at bedside. Patient significantly less confused today. Patient reports he slept well lasts night. Discussed Zonergan prescription and patient reports feeling "sluggish" the day after and not taking it again thereafter. Discussed starting Depakote for seizure/migraine prophylaxis for which patient was agreeable.   Vitals:   03/10/21 0400 03/10/21 0600 03/10/21 0753 03/10/21 1208  BP: (!) 129/106  (!) 148/100 (!) 136/96  Pulse: 88     Resp: 18  19   Temp: 98.4 F (36.9 C)  98.2 F (36.8 C) 97.6 F (36.4 C)  TempSrc:   Oral Oral  SpO2: 96%  98% 98%  Weight:  65.6 kg    Height:       CBC:  Recent Labs  Lab 03/08/21 1930 03/08/21 2003 03/09/21 0330 03/10/21 0116  WBC 8.9  --  10.4 10.3  NEUTROABS 5.2  --   --   --   HGB 11.2*   < > 10.6* 10.8*  HCT 36.7*   < > 34.9* 34.9*  MCV 87.8  --   88.1 86.4  PLT 491*  --  467* 506*   < > = values in this interval not displayed.    Basic Metabolic Panel:  Recent Labs  Lab 03/09/21 0330 03/10/21 0116  NA 134* 131*  K 3.6 3.8  CL 102 101  CO2 25 23  GLUCOSE 129* 130*  BUN 6* 13  CREATININE 0.65 0.76  CALCIUM 8.4* 8.7*    Lipid Panel:  Recent Labs  Lab 03/09/21 0330  CHOL 111  TRIG 88  HDL 50  CHOLHDL 2.2  VLDL 18  LDLCALC 43    HgbA1c:  Recent Labs  Lab 03/09/21 0330  HGBA1C 6.3*   Urine Drug Screen: No results for input(s): LABOPIA, COCAINSCRNUR, LABBENZ, AMPHETMU, THCU, LABBARB in the last 168 hours.  Alcohol Level  Recent Labs  Lab 03/08/21 1930  ETH <10     IMAGING past 24 hours ECHOCARDIOGRAM COMPLETE  Result Date: 03/10/2021    ECHOCARDIOGRAM REPORT   Patient Name:   [name] Date of Exam: 03/10/2021 Medical Rec #:  [id number]     Height:       66.0 in Accession #:    [id number]    Weight:       144.6 lb Date of Birth:  1931/09/17      BSA:  1.742 m Patient Age:    88 years      BP:           146/90 mmHg Patient Gender: M             HR:           84 bpm. Exam Location:  Inpatient Procedure: 2D Echo, 3D Echo, Cardiac Doppler, Color Doppler and Strain Analysis Indications:    TIA G45.9  History:        Patient has prior history of Echocardiogram examinations, most                 recent 02/24/2016. Risk Factors:Diabetes and Dyslipidemia.                 Paroxysmal atrial fibrillation not on anticoagulation, chronic                 systolic CHF, Normocytic anemia, chronic GI bleeds.  Sonographer:    Darlina Sicilian RDCS Referring Phys: [id number] VISHAL R PATEL  Sonographer Comments: Global longitudinal strain was attempted. IMPRESSIONS  1. Left ventricular ejection fraction, by estimation, is 55 to 60%. The left ventricle has normal function. The left ventricle has no regional wall motion abnormalities. Left ventricular diastolic parameters are consistent with Grade I diastolic dysfunction (impaired  relaxation). The average left ventricular global longitudinal strain is -16.6 %. The global longitudinal strain is normal.  2. Right ventricular systolic function is normal. The right ventricular size is normal.  3. The mitral valve is abnormal. Trivial mitral valve regurgitation. No evidence of mitral stenosis.  4. The aortic valve is tricuspid. Aortic valve regurgitation is not visualized. No aortic stenosis is present.  5. Aortic dilatation noted. There is mild dilatation of the aortic root, measuring 40 mm.  6. The inferior vena cava is normal in size with greater than 50% respiratory variability, suggesting right atrial pressure of 3 mmHg. FINDINGS  Left Ventricle: Left ventricular ejection fraction, by estimation, is 55 to 60%. The left ventricle has normal function. The left ventricle has no regional wall motion abnormalities. The average left ventricular global longitudinal strain is -16.6 %. The global longitudinal strain is normal. The left ventricular internal cavity size was normal in size. There is no left ventricular hypertrophy. Left ventricular diastolic parameters are consistent with Grade I diastolic dysfunction (impaired relaxation). Right Ventricle: The right ventricular size is normal. No increase in right ventricular wall thickness. Right ventricular systolic function is normal. Left Atrium: Left atrial size was normal in size. Right Atrium: Right atrial size was normal in size. Pericardium: There is no evidence of pericardial effusion. Mitral Valve: The mitral valve is abnormal. There is mild thickening of the mitral valve leaflet(s). There is mild calcification of the mitral valve leaflet(s). Trivial mitral valve regurgitation. No evidence of mitral valve stenosis. Tricuspid Valve: The tricuspid valve is normal in structure. Tricuspid valve regurgitation is not demonstrated. No evidence of tricuspid stenosis. Aortic Valve: The aortic valve is tricuspid. Aortic valve regurgitation is not  visualized. No aortic stenosis is present. Pulmonic Valve: The pulmonic valve was normal in structure. Pulmonic valve regurgitation is not visualized. No evidence of pulmonic stenosis. Aorta: Aortic dilatation noted. There is mild dilatation of the aortic root, measuring 40 mm. Venous: The inferior vena cava is normal in size with greater than 50% respiratory variability, suggesting right atrial pressure of 3 mmHg. IAS/Shunts: No atrial level shunt detected by color flow Doppler.  LEFT VENTRICLE PLAX 2D LVIDd:  4.50 cm   Diastology LVIDs:         3.00 cm   LV e' medial:    4.51 cm/s LV PW:         0.80 cm   LV E/e' medial:  12.4 LV IVS:        0.90 cm   LV e' lateral:   8.17 cm/s LVOT diam:     2.10 cm   LV E/e' lateral: 6.8 LV SV:         37 LV SV Index:   21        2D Longitudinal Strain LVOT Area:     3.46 cm  2D Strain GLS Avg:     -16.6 %                           3D Volume EF:                          3D EF:        54 %                          LV EDV:       111 ml                          LV ESV:       51 ml                          LV SV:        60 ml RIGHT VENTRICLE RV S prime:     18.50 cm/s TAPSE (M-mode): 2.6 cm LEFT ATRIUM             Index        RIGHT ATRIUM          Index LA diam:        1.90 cm 1.09 cm/m   RA Area:     9.92 cm LA Vol (A2C):   46.9 ml 26.92 ml/m  RA Volume:   18.60 ml 10.67 ml/m LA Vol (A4C):   48.2 ml 27.66 ml/m LA Biplane Vol: 49.6 ml 28.47 ml/m  AORTIC VALVE LVOT Vmax:   86.70 cm/s LVOT Vmean:  49.100 cm/s LVOT VTI:    0.107 m  AORTA Ao Root diam: 4.00 cm Ao Asc diam:  3.40 cm MITRAL VALVE MV Area (PHT):  4.54 cm    SHUNTS MV Area (plan): 4.05 cm    Systemic VTI:  0.11 m MV Decel Time:  167 msec    Systemic Diam: 2.10 cm MV E velocity: 55.70 cm/s MV A velocity: 127.00 cm/s MV E/A ratio:  0.44 Jenkins Rouge MD Electronically signed by Jenkins Rouge MD Signature Date/Time: 03/10/2021/11:35:47 AM    Final     PHYSICAL EXAM  Temp:  [97.6 F (36.4 C)-98.9 F (37.2  C)] 97.6 F (36.4 C) (01/23 1208) Pulse Rate:  [79-90] 88 (01/23 0400) Resp:  [15-20] 19 (01/23 0753) BP: (126-162)/(80-106) 136/96 (01/23 1208) SpO2:  [94 %-98 %] 98 % (01/23 1208) Weight:  [65.6 kg] 65.6 kg (01/23 0600)  General - pleasant but confused elder caucasian male.  Cardiovascular - tachycardic.  Mental Status -  Oriented to self but not to age, context, location, time. Unable to assess language, attention span, concentration, memory  due to lack of comprehension and confusion.   Cranial Nerves II - XII - unable to assess due to noncooperation  Motor Strength - The patients strength was normal in all extremities.   Motor Tone - Muscle tone was assessed at the neck and appendages and was normal.  Reflexes - The patients reflexes were symmetrical in all extremities and he had no pathological reflexes.  Sensory - unable to assess due to noncooperation.    Coordination - unable to assess due to noncooperation.  Gait and Station - deferred.  ASSESSMENT/PLAN [name] is a 88 y.o. male with history of previous stroke and TIA, recurrent GI bleed, PAF not on anticoagulation, esophageal stenosis, chronic systolic CHF, J4HF, HLD, asthma, chronic prednisone use presenting with 15 minute episode of incomprehensible speech.   TIA vs Seizure vs complex migraine CTA head & neck: no LVO, no significant stenosis, but mild atheromatous disease about carotid bifurcations and carotid siphons without stenosis MRI  No acute intracranial abnormality 2D Echo EF 55-60% EEG moderate diffuse slowing indicative of global cerebral dysfunction. No epileptiform abnormalities noted. LDL 43 HgbA1c 6.3 VTE prophylaxis - SCD    Diet   Diet heart healthy/carb modified Room service appropriate? No; Fluid consistency: Thin   No antithrombotic prior to admission, now on aspirin 81 mg daily.  EEG: moderate diffuse slowing indicative of global cerebral dysfunction but no epileptiform  abnormalities were noted Therapy recommendations: SNF Recommend Depakote for seizure vs. Complicated migraine prophylaxis Disposition:  pending  Hyperlipidemia Home meds:  zocor LDL 43, goal < 70 Zocor 40 mg continued this admission High intensity statin not indicated  Continue statin at discharge  Diabetes type II  Home meds:  metformin HgbA1c 6.3, goal < 7.0 CBGs SSI  Other Stroke Risk Factors Advanced Age >/= 67  ETOH use, alcohol level <10, advised to drink no more than 1-2 drink(s) a day Hx stroke/TIA Congestive heart failure  Other Active Problems Systolic and diastolic CHF Hx of Esophageal stricture  Cough variant asthma T12 compression fracture  Hospital day # 0  France Ravens, MD PGY1 Resident  To contact Stroke Continuity provider, please refer to [email or web address] After hours, contact General Neurology

## 2021-03-10 NOTE — Progress Notes (Signed)
°  Echocardiogram 2D Echocardiogram has been performed.  Darlina Sicilian M 03/10/2021, 10:24 AM

## 2021-03-10 NOTE — Progress Notes (Signed)
Physical Therapy Treatment Patient Details Name: [name] MRN: [id number] DOB: [birth year] Today's Date: 03/10/2021   History of Present Illness Pt is an 88 y.o. male who presented 03/08/21 for abnormal speech and L-sided facial droop, whcih resolved. Being worked up for possible TIA. No CT or MRI evidence for acute intracranial abnormality or CT evidence of acute osseous abnormality in cervical spine. Radiographs of lumbar region revealed indeterminate age fractures of T10, T11, T12 and L4 new compared to prior CT scan of July 2022. PMH: CVA, recurrent GI bleed, paroxysmal atrial fibrillation not on anticoagulation, esophageal stenosis, chronic systolic CHF (last EF 81% 02/2016), T2DM, HLD, asthma, chronic prednisone use (on 5 mg daily for asthma and chronic bronchitis)    PT Comments    Patient remains more confused than his usual (per wife), but able to progress to ambulation this date as he is more alert. Distance limited by lack of his left AFO, with noted increased supination of left ankle in stance. Wife to bring in AFO if pt is not discharged to SNF tomorrow.     Recommendations for follow up therapy are one component of a multi-disciplinary discharge planning process, led by the attending physician.  Recommendations may be updated based on patient status, additional functional criteria and insurance authorization.  Follow Up Recommendations  Skilled nursing-short term rehab (<3 hours/day)     Assistance Recommended at Discharge Frequent or constant Supervision/Assistance  Patient can return home with the following A lot of help with bathing/dressing/bathroom;A lot of help with walking and/or transfers;Assistance with cooking/housework;Assistance with feeding;Direct supervision/assist for medications management;Direct supervision/assist for financial management;Assist for transportation;Help with stairs or ramp for entrance;Two people to help with bathing/dressing/bathroom   Equipment  Recommendations  Other (comment) (TBA)    Recommendations for Other Services       Precautions / Restrictions Precautions Precautions: Fall;Back Precaution Booklet Issued: Yes (comment) (reviewed, but pt not appearing to retain any info) Precaution Comments: L-residual weakness, wears AFO and knee brace per chart Required Braces or Orthoses: Spinal Brace Spinal Brace: Thoracolumbosacral orthotic;Applied in sitting position ("Just apply when he is out of bed can be taken off for showers" - Dr. Candiss Norse)     Mobility  Bed Mobility                    Transfers Overall transfer level: Needs assistance Equipment used: Rolling walker (2 wheels) Transfers: Sit to/from Stand Sit to Stand: Min assist           General transfer comment: min A to power into standing.    Ambulation/Gait Ambulation/Gait assistance: Min assist, +2 safety/equipment Gait Distance (Feet): 40 Feet Assistive device: Rolling walker (2 wheels) Gait Pattern/deviations: Step-to pattern, Steppage, Decreased stride length, Knee hyperextension - left Gait velocity: very slow     General Gait Details: as pt fatigued, noted more left ankle supination during stance (left AFO not here--wife to bring 1/24 if pt is not discharged to SNF)   Stairs             Wheelchair Mobility    Modified Rankin (Stroke Patients Only) Modified Rankin (Stroke Patients Only) Pre-Morbid Rankin Score: Moderate disability Modified Rankin: Moderately severe disability     Balance Overall balance assessment: Needs assistance Sitting-balance support: Feet supported Sitting balance-Leahy Scale: Fair   Postural control: Right lateral lean Standing balance support: Bilateral upper extremity supported Standing balance-Leahy Scale: Poor Standing balance comment: BUE supported on RW +therapist support  Cognition Arousal/Alertness: Awake/alert Behavior During Therapy: WFL for tasks  assessed/performed Overall Cognitive Status: Impaired/Different from baseline Area of Impairment: Attention, Memory, Following commands, Safety/judgement, Awareness, Problem solving                 Orientation Level:  (NT) Current Attention Level: Focused Memory: Decreased short-term memory, Decreased recall of precautions Following Commands: Follows one step commands inconsistently, Follows one step commands with increased time Safety/Judgement: Decreased awareness of safety, Decreased awareness of deficits Awareness: Intellectual Problem Solving: Slow processing, Decreased initiation, Difficulty sequencing, Requires verbal cues, Requires tactile cues General Comments: Pt pleasantly confused He does follow simple commands well with incrased time and cues.        Exercises      General Comments General comments (skin integrity, edema, etc.): wife present      Pertinent Vitals/Pain Pain Assessment Pain Assessment: Faces Faces Pain Scale: Hurts a little bit Breathing: normal Negative Vocalization: none Facial Expression: sad, frightened, frown Body Language: relaxed Consolability: no need to console PAINAD Score: 1 Pain Location: back Pain Descriptors / Indicators: Sore Pain Intervention(s): Limited activity within patient's tolerance, Monitored during session, Repositioned    Home Living                          Prior Function            PT Goals (current goals can now be found in the care plan section) Acute Rehab PT Goals Patient Stated Goal: to get warm; wife wants pt to improve and eventually return with her to ILF Time For Goal Achievement: 03/23/21 Potential to Achieve Goals: Good Progress towards PT goals: Progressing toward goals    Frequency    Min 2X/week      PT Plan Current plan remains appropriate;Frequency needs to be updated    Co-evaluation              AM-PAC PT "6 Clicks" Mobility   Outcome Measure  Help needed  turning from your back to your side while in a flat bed without using bedrails?: Total Help needed moving from lying on your back to sitting on the side of a flat bed without using bedrails?: Total Help needed moving to and from a bed to a chair (including a wheelchair)?: Total Help needed standing up from a chair using your arms (e.g., wheelchair or bedside chair)?: Total Help needed to walk in hospital room?: Total Help needed climbing 3-5 steps with a railing? : Total 6 Click Score: 6    End of Session Equipment Utilized During Treatment: Gait belt Activity Tolerance: Patient tolerated treatment well Patient left: with call bell/phone within reach;in chair;with chair alarm set;with family/visitor present Nurse Communication: Mobility status PT Visit Diagnosis: Muscle weakness (generalized) (M62.81);History of falling (Z91.81);Difficulty in walking, not elsewhere classified (R26.2);Other symptoms and signs involving the nervous system (G28.366)     Time: 2947-6546 PT Time Calculation (min) (ACUTE ONLY): 22 min  Charges:  $Gait Training: 8-22 mins                      Arby Barrette, PT [place]  Pager [phone] Office [phone]    Rexanne Mano 03/10/2021, 1:44 PM

## 2021-03-11 LAB — CBC WITH DIFFERENTIAL/PLATELET
Abs Immature Granulocytes: 0.05 10*3/uL (ref 0.00–0.07)
Basophils Absolute: 0.1 10*3/uL (ref 0.0–0.1)
Basophils Relative: 1 %
Eosinophils Absolute: 0 10*3/uL (ref 0.0–0.5)
Eosinophils Relative: 0 %
HCT: 35.7 % — ABNORMAL LOW (ref 39.0–52.0)
Hemoglobin: 11.3 g/dL — ABNORMAL LOW (ref 13.0–17.0)
Immature Granulocytes: 0 %
Lymphocytes Relative: 13 %
Lymphs Abs: 1.5 10*3/uL (ref 0.7–4.0)
MCH: 26.9 pg (ref 26.0–34.0)
MCHC: 31.7 g/dL (ref 30.0–36.0)
MCV: 85 fL (ref 80.0–100.0)
Monocytes Absolute: 1.2 10*3/uL — ABNORMAL HIGH (ref 0.1–1.0)
Monocytes Relative: 10 %
Neutro Abs: 9.1 10*3/uL — ABNORMAL HIGH (ref 1.7–7.7)
Neutrophils Relative %: 76 %
Platelets: 539 10*3/uL — ABNORMAL HIGH (ref 150–400)
RBC: 4.2 MIL/uL — ABNORMAL LOW (ref 4.22–5.81)
RDW: 18.4 % — ABNORMAL HIGH (ref 11.5–15.5)
WBC: 11.8 10*3/uL — ABNORMAL HIGH (ref 4.0–10.5)
nRBC: 0 % (ref 0.0–0.2)

## 2021-03-11 LAB — UREA NITROGEN, URINE: Urea Nitrogen, Ur: 421 mg/dL

## 2021-03-11 LAB — RESP PANEL BY RT-PCR (FLU A&B, COVID) ARPGX2
Influenza A by PCR: NEGATIVE
Influenza B by PCR: NEGATIVE
SARS Coronavirus 2 by RT PCR: NEGATIVE

## 2021-03-11 LAB — COMPREHENSIVE METABOLIC PANEL
ALT: 12 U/L (ref 0–44)
AST: 19 U/L (ref 15–41)
Albumin: 3.5 g/dL (ref 3.5–5.0)
Alkaline Phosphatase: 76 U/L (ref 38–126)
Anion gap: 10 (ref 5–15)
BUN: 18 mg/dL (ref 8–23)
CO2: 22 mmol/L (ref 22–32)
Calcium: 8.7 mg/dL — ABNORMAL LOW (ref 8.9–10.3)
Chloride: 96 mmol/L — ABNORMAL LOW (ref 98–111)
Creatinine, Ser: 0.75 mg/dL (ref 0.61–1.24)
GFR, Estimated: >60 mL/min (ref >60)
Glucose, Bld: 163 mg/dL — ABNORMAL HIGH (ref 70–99)
Potassium: 3.6 mmol/L (ref 3.5–5.1)
Sodium: 128 mmol/L — ABNORMAL LOW (ref 135–145)
Total Bilirubin: 0.5 mg/dL (ref 0.3–1.2)
Total Protein: 6.3 g/dL — ABNORMAL LOW (ref 6.5–8.1)

## 2021-03-11 LAB — MAGNESIUM: Magnesium: 2.1 mg/dL (ref 1.7–2.4)

## 2021-03-11 LAB — BRAIN NATRIURETIC PEPTIDE: B Natriuretic Peptide: 24.2 pg/mL (ref 0.0–100.0)

## 2021-03-11 MED ORDER — MUPIROCIN 2 % EX OINT: 1.0000 "application " | TOPICAL_OINTMENT | Freq: Two times a day (BID) | CUTANEOUS | Status: DC

## 2021-03-11 MED ORDER — TRAMADOL HCL 50 MG PO TABS
50.0000 mg | ORAL_TABLET | Freq: Two times a day (BID) | ORAL | Status: DC | PRN
  Administered 2021-03-11 – 2021-03-12 (×2): 50 mg via ORAL
  Filled 2021-03-11 (×2): qty 1

## 2021-03-11 MED ORDER — CHLORHEXIDINE GLUCONATE CLOTH 2 % EX PADS: 6.0000 | MEDICATED_PAD | Freq: Every day | CUTANEOUS | Status: DC

## 2021-03-11 MED ORDER — ACETAMINOPHEN 500 MG PO TABS
500.0000 mg | ORAL_TABLET | Freq: Two times a day (BID) | ORAL | Status: DC
  Administered 2021-03-11 – 2021-03-12 (×3): 500 mg via ORAL
  Filled 2021-03-11 (×3): qty 1

## 2021-03-11 MED ORDER — POTASSIUM CHLORIDE CRYS ER 20 MEQ PO TBCR
40.0000 meq | EXTENDED_RELEASE_TABLET | Freq: Once | ORAL | Status: AC
  Administered 2021-03-11: 06:00:00 40 meq via ORAL
  Filled 2021-03-11: qty 2

## 2021-03-11 MED ORDER — TOLVAPTAN 15 MG PO TABS
15.0000 mg | ORAL_TABLET | Freq: Once | ORAL | Status: AC
  Administered 2021-03-11: 12:00:00 15 mg via ORAL
  Filled 2021-03-11: qty 1

## 2021-03-11 MED ORDER — LORAZEPAM 2 MG/ML IJ SOLN
0.5000 mg | Freq: Once | INTRAMUSCULAR | Status: AC | PRN
  Administered 2021-03-11: 22:00:00 0.5 mg via INTRAVENOUS
  Filled 2021-03-11: qty 1

## 2021-03-11 MED ORDER — CARVEDILOL 3.125 MG PO TABS
3.1250 mg | ORAL_TABLET | Freq: Two times a day (BID) | ORAL | Status: DC
  Administered 2021-03-11 – 2021-03-12 (×4): 3.125 mg via ORAL
  Filled 2021-03-11 (×4): qty 1

## 2021-03-11 MED ORDER — LORAZEPAM 2 MG/ML IJ SOLN
0.5000 mg | Freq: Once | INTRAMUSCULAR | Status: DC
Start: 2021-03-11 — End: 2021-03-11

## 2021-03-11 MED ORDER — LORAZEPAM 2 MG/ML IJ SOLN
INTRAMUSCULAR | Status: AC
  Administered 2021-03-11: 01:00:00 0.5 mg
  Filled 2021-03-11: qty 1

## 2021-03-11 NOTE — Plan of Care (Signed)
?  Problem: Clinical Measurements: ?Goal: Respiratory complications will improve ?Outcome: Progressing ?  ?Problem: Activity: ?Goal: Risk for activity intolerance will decrease ?Outcome: Progressing ?  ?Problem: Elimination: ?Goal: Will not experience complications related to urinary retention ?Outcome: Progressing ?  ?

## 2021-03-11 NOTE — TOC Progression Note (Signed)
Transition of Care [facility]) - Progression Note    Patient Details  Name: [name] MRN: [id number] Date of Birth: [birth year]  Transition of Care [facility]) CM/SW [place], LCSW Phone Number: 03/11/2021, 8:37 AM  Clinical Narrative:    Insurance approval received for [facility]: Ref# [id number] ID# [id number], effective 03/11/2021-03/13/2021.     Barriers to Discharge: Continued Medical Work up  Expected Discharge Plan and Services   In-house Referral: Clinical Social Work     Living arrangements for the past 2 months: [place] ([facility])                                       Social Determinants of Health (SDOH) Interventions    Readmission Risk Interventions No flowsheet data found.

## 2021-03-11 NOTE — Progress Notes (Signed)
PROGRESS NOTE    [name]  ZJQ:[id number] DOB: 11-01-1931 DOA: 03/08/2021 PCP: Grant Carol, MD   Brief Narrative:   [name] is a 88 y.o. male with medical history significant for history of CVA, recurrent GI bleed, paroxysmal atrial fibrillation not on anticoagulation, esophageal stenosis, chronic systolic CHF (last EF 24% 02/2016), T2DM, HLD, asthma, chronic prednisone use (on 5 mg daily for asthma and chronic bronchitis) who presented to the ED for evaluation of abnormal speech. Patient has a history of recurrent GI bleeding.  He takes aspirin 81 mg daily and states that he has not tolerated dual antiplatelet therapy in the past due to bleeding.  ED Course:  Initial vitals showed BP 166/97, pulse 82, RR 22, temp 98.2 F, SPO2 99% on room air.Labs show WBC 8.9, hemoglobin 11.2, platelets 491,000, sodium 136, potassium 4.2, bicarb 24, BUN 10, creatinine 0.78, serum glucose 108, LFTs within normal limits, serum ethanol undetectable, urinalysis negative for UTI. CT head without contrast negative for acute intracranial abnormality.  Atrophy and chronic small vessel ischemic changes of white matter seen.  Ventricles slightly more prominent compared to prior head CT. CT cervical spine without contrast shows straightening of the C-spine with degenerative changes, no acute osseous abnormality. CT thoracic spine shows slight compression fracture at T12, no retropulsed fracture fragments. CT lumbar spine negative for acute bony abnormality.  Convex rightward scoliosis and moderate to advanced degenerative disc and facet disease noted. CT chest/abdomen/pelvis negative for acute findings.  Moderate sized hiatal hernia, CAD, aortic atherosclerosis and colonic diverticulosis noted. TLSO brace was ordered.  Neurology were consulted and the hospitalist service was consulted to admit for further evaluation and management.  Assessment & Plan:   Strokelike symptoms: -Could be secondary to TIA versus  seizures versus complex migraine versus delirium -CTA head and neck, MRI brain: Negative for acute findings. -Continue on telemetry monitoring -Echocardiogram stable with improved EF as compared to 2018.  LDL: 43, A1c: 6.3 -Neurology on board-appreciate help, case discussed with stroke MD Dr.Xu, now on combination of Depakote along with Zonegran. -EEG shows moderate diffuse slowing indicative of global cerebral dysfunction but no epileptiform abnormalities were noted. -PT/OT recommended SNF -On fall precautions/seizure precautions  Paroxysmal A. fib: -Remains in sinus rhythm.  Not on anticoagulation due to history of GI bleeding. -Continue aspirin. -Not on rate controlling medication -Monitor closely on telemetry  Type 2 diabetes: A1c 6.3%, Hold metformin.  Monitor blood sugar closely.  Hyponatremia consistent with SIADH.  Due to possibility of stroke and seizures avoiding Lasix, Samsca trial on 03/11/2021 and monitor.    History of esophageal stricture: -Followed by GI outpatient. -EGD attempted on 02/28/2021 had to be aborted due to inability to pass through tortuous stenotic esophagus. -Needs SLP evaluation.  Chronic combined systolic and diastolic CHF: Echo from 0973 showed ejection fraction of 45% with grade 1 diastolic dysfunction. EF now better at 55%, appears euvolemic on exam.  Strict INO's.  Had asymptomatic, nonsustained 6 beat run of V. tach on 03/11/2021 in the morning, placed on beta-blocker, electrolytes stable, asymptomatic.  Monitor  Cough variant asthma: -Stable.  No wheezing on exam.  Continue home inhalers. -Continue prednisone 5 mg daily  T12 compression fracture: -Chronic. -Reviewed CT imaging.  Placed on twice daily scheduled Tylenol along with twice daily as needed tramadol for pain control, appears better today, continue TLSO brace placed in ED when out of bed.  Hyperlipidemia: Continue statin  Normocytic anemia: -H&H 10.6/34.9.  Continue to  monitor.  Chronic  thrombocytosis: Platelet 467.  Metabolic encephalopathy -hospital-acquired delirium due to unfamiliar settings and advanced age, supportive care minimize narcotics and benzodiazepines    DVT prophylaxis: SCD Code Status: full code-confirmed with wife. Family Communication: Wife [name] [phone] on 03/10/2021 and 03/11/2021  I called patient's wife and discussed plan of care and she verbalized understanding.  Disposition Plan: SNF  Consultants:  neurology  Procedures:   EEG - non acute CT Abd Pelvis  - No acute findings in the chest, abdomen or pelvis. Moderate-sized hiatal hernia. Coronary artery disease, aortic atherosclerosis. Colonic diverticulosis. CT Head & C Spine - 1. No CT evidence for acute intracranial abnormality. Atrophy and chronic small vessel ischemic changes of the white matter. Ventricles slightly more prominent compared to prior head CT potentially due to slight atrophy progression. 2. Straightening of the cervical spine with degenerative changes. No acute osseous abnormality is seen. CT T & L Spine  - CT THORACIC SPINE IMPRESSION Slight compression fracture at T12. No retropulsed fracture fragments. CT LUMBAR SPINE IMPRESSION No acute bony abnormality. Convex rightward scoliosis. Moderate to advanced degenerative disc and facet disease. MRI - 1. No acute intracranial abnormality. The examinationIs mildly motion degraded, and no coronal T2 weighted imaging was obtained. 2. Moderate for age chronic small vessel disease, progressed in the left thalamus and bilateral cerebral white matter since 2020 TTE -  1. Left ventricular ejection fraction, by estimation, is 55 to 60%. The left ventricle has normal function. The left ventricle has no regional wall motion abnormalities. Left ventricular diastolic parameters are consistent with Grade I diastolic dysfunction (impaired relaxation). The average left ventricular global longitudinal strain is -16.6 %. The  global longitudinal strain is normal.  2. Right ventricular systolic function is normal. The right ventricular size is normal.  3. The mitral valve is abnormal. Trivial mitral valve regurgitation. No evidence of mitral stenosis.  4. The aortic valve is tricuspid. Aortic valve regurgitation is not visualized. No aortic stenosis is present.  5. Aortic dilatation noted. There is mild dilatation of the aortic root, measuring 40 mm.  6. The inferior vena cava is normal in size with greater than 50% respiratory variability, suggesting right atrial pressure of 3 mmHg.     Antimicrobials:  none   Status is: Observation    Subjective:  Patient in bed, appears comfortable, denies any headache, no fever, no chest pain or pressure, no shortness of breath , no abdominal pain. No new focal weakness.      Objective: Vitals:   03/11/21 0000 03/11/21 0639 03/11/21 0755 03/11/21 0757  BP:   (!) 145/91   Pulse:   99   Resp:   20   Temp: 97.8 F (36.6 C) 97.9 F (36.6 C) 97.8 F (36.6 C)   TempSrc: Axillary Oral Oral   SpO2: 97% 97%  97%  Weight:      Height:        Intake/Output Summary (Last 24 hours) at 03/11/2021 1052 Last data filed at 03/11/2021 1027 Gross per 24 hour  Intake 120 ml  Output 750 ml  Net -630 ml   Filed Weights   03/08/21 1921 03/10/21 0600  Weight: 70.3 kg 65.6 kg    Examination:  Awake Alert x2, No new F.N deficits, Normal affect [place].AT,PERRAL Supple Neck, No JVD,   Symmetrical Chest wall movement, Good air movement bilaterally, CTAB RRR,No Gallops, Rubs or new Murmurs,  +ve B.Sounds, Abd Soft, No tenderness,   No Cyanosis, Clubbing or edema      Data  Reviewed: I have personally reviewed following labs and imaging studies  Recent Labs  Lab 03/08/21 1930 03/08/21 2003 03-19-2021 0330 03/10/21 0116 03/11/21 0033  WBC 8.9  --  10.4 10.3 11.8*  HGB 11.2* 13.3 10.6* 10.8* 11.3*  HCT 36.7* 39.0 34.9* 34.9* 35.7*  PLT 491*  --  467* 506* 539*  MCV 87.8  --   88.1 86.4 85.0  MCH 26.8  --  26.8 26.7 26.9  MCHC 30.5  --  30.4 30.9 31.7  RDW 19.4*  --  19.0* 18.7* 18.4*  LYMPHSABS 2.0  --   --   --  1.5  MONOABS 1.2*  --   --   --  1.2*  EOSABS 0.5  --   --   --  0.0  BASOSABS 0.1  --   --   --  0.1    Recent Labs  Lab 03/08/21 1930 03/08/21 2003 2021/03/19 0330 03/10/21 0116 03/11/21 0033  NA 136 136 134* 131* 128*  K 4.2 4.1 3.6 3.8 3.6  CL 102 100 102 101 96*  CO2 24  --  25 23 22   GLUCOSE 108* 107* 129* 130* 163*  BUN 10 9 6* 13 18  CREATININE 0.78 0.70 0.65 0.76 0.75  CALCIUM 8.8*  --  8.4* 8.7* 8.7*  AST 18  --   --   --  19  ALT 11  --   --   --  12  ALKPHOS 71  --   --   --  76  BILITOT 0.4  --   --   --  0.5  ALBUMIN 3.7  --   --   --  3.5  MG  --   --   --   --  2.1  INR 1.0  --   --   --   --   HGBA1C  --   --  6.3*  --   --   BNP  --   --   --   --  24.2    Radiology Studies: EEG adult  Result Date: 2021/03/19 Derek Jack, MD     03/19/21 12:54 PM Routine EEG Report JAYCOB MCCLENTON is a 88 y.o. male with a history of transient expressive aphasia who is undergoing an EEG to evaluate for seizures. Report: This EEG was acquired with electrodes placed according to the International 10-20 electrode system (including Fp1, Fp2, F3, F4, C3, C4, P3, P4, O1, O2, T3, T4, T5, T6, A1, A2, Fz, Cz, Pz). The following electrodes were missing or displaced: none. The occipital dominant rhythm was 5-6 Hz. This activity is reactive to stimulation. Drowsiness was manifested by background fragmentation; deeper stages of sleep were identified by K complexes and sleep spindles. There was no focal slowing. There were no interictal epileptiform discharges. Photic stimulation and hyperventilation were not performed. Impression and clinical correlation: This EEG was obtained while awake and asleep and is abnormal due to moderate diffuse slowing indicative of global cerebral dysfunction. Epileptiform abnormalities were not seen during this  recording. Su Monks, MD [facility] [phone] If 7pm- 7am, please page neurology on call as listed in [place].   ECHOCARDIOGRAM COMPLETE  Result Date: 03/10/2021    ECHOCARDIOGRAM REPORT   Patient Name:   DEMETRIUS MAHLER Date of Exam: 03/10/2021 Medical Rec #:  [id number]     Height:       66.0 in Accession #:    [id number]    Weight:  144.6 lb Date of Birth:  12/14/1931      BSA:          1.742 m Patient Age:    19 years      BP:           146/90 mmHg Patient Gender: M             HR:           84 bpm. Exam Location:  Inpatient Procedure: 2D Echo, 3D Echo, Cardiac Doppler, Color Doppler and Strain Analysis Indications:    TIA G45.9  History:        Patient has prior history of Echocardiogram examinations, most                 recent 02/24/2016. Risk Factors:Diabetes and Dyslipidemia.                 Paroxysmal atrial fibrillation not on anticoagulation, chronic                 systolic CHF, Normocytic anemia, chronic GI bleeds.  Sonographer:    Darlina Sicilian RDCS Referring Phys: [id number] VISHAL R PATEL  Sonographer Comments: Global longitudinal strain was attempted. IMPRESSIONS  1. Left ventricular ejection fraction, by estimation, is 55 to 60%. The left ventricle has normal function. The left ventricle has no regional wall motion abnormalities. Left ventricular diastolic parameters are consistent with Grade I diastolic dysfunction (impaired relaxation). The average left ventricular global longitudinal strain is -16.6 %. The global longitudinal strain is normal.  2. Right ventricular systolic function is normal. The right ventricular size is normal.  3. The mitral valve is abnormal. Trivial mitral valve regurgitation. No evidence of mitral stenosis.  4. The aortic valve is tricuspid. Aortic valve regurgitation is not visualized. No aortic stenosis is present.  5. Aortic dilatation noted. There is mild dilatation of the aortic root, measuring 40 mm.  6. The inferior vena cava is normal in size  with greater than 50% respiratory variability, suggesting right atrial pressure of 3 mmHg. FINDINGS  Left Ventricle: Left ventricular ejection fraction, by estimation, is 55 to 60%. The left ventricle has normal function. The left ventricle has no regional wall motion abnormalities. The average left ventricular global longitudinal strain is -16.6 %. The global longitudinal strain is normal. The left ventricular internal cavity size was normal in size. There is no left ventricular hypertrophy. Left ventricular diastolic parameters are consistent with Grade I diastolic dysfunction (impaired relaxation). Right Ventricle: The right ventricular size is normal. No increase in right ventricular wall thickness. Right ventricular systolic function is normal. Left Atrium: Left atrial size was normal in size. Right Atrium: Right atrial size was normal in size. Pericardium: There is no evidence of pericardial effusion. Mitral Valve: The mitral valve is abnormal. There is mild thickening of the mitral valve leaflet(s). There is mild calcification of the mitral valve leaflet(s). Trivial mitral valve regurgitation. No evidence of mitral valve stenosis. Tricuspid Valve: The tricuspid valve is normal in structure. Tricuspid valve regurgitation is not demonstrated. No evidence of tricuspid stenosis. Aortic Valve: The aortic valve is tricuspid. Aortic valve regurgitation is not visualized. No aortic stenosis is present. Pulmonic Valve: The pulmonic valve was normal in structure. Pulmonic valve regurgitation is not visualized. No evidence of pulmonic stenosis. Aorta: Aortic dilatation noted. There is mild dilatation of the aortic root, measuring 40 mm. Venous: The inferior vena cava is normal in size with greater than 50% respiratory variability, suggesting right atrial pressure of  3 mmHg. IAS/Shunts: No atrial level shunt detected by color flow Doppler.  LEFT VENTRICLE PLAX 2D LVIDd:         4.50 cm   Diastology LVIDs:         3.00 cm    LV e' medial:    4.51 cm/s LV PW:         0.80 cm   LV E/e' medial:  12.4 LV IVS:        0.90 cm   LV e' lateral:   8.17 cm/s LVOT diam:     2.10 cm   LV E/e' lateral: 6.8 LV SV:         37 LV SV Index:   21        2D Longitudinal Strain LVOT Area:     3.46 cm  2D Strain GLS Avg:     -16.6 %                           3D Volume EF:                          3D EF:        54 %                          LV EDV:       111 ml                          LV ESV:       51 ml                          LV SV:        60 ml RIGHT VENTRICLE RV S prime:     18.50 cm/s TAPSE (M-mode): 2.6 cm LEFT ATRIUM             Index        RIGHT ATRIUM          Index LA diam:        1.90 cm 1.09 cm/m   RA Area:     9.92 cm LA Vol (A2C):   46.9 ml 26.92 ml/m  RA Volume:   18.60 ml 10.67 ml/m LA Vol (A4C):   48.2 ml 27.66 ml/m LA Biplane Vol: 49.6 ml 28.47 ml/m  AORTIC VALVE LVOT Vmax:   86.70 cm/s LVOT Vmean:  49.100 cm/s LVOT VTI:    0.107 m  AORTA Ao Root diam: 4.00 cm Ao Asc diam:  3.40 cm MITRAL VALVE MV Area (PHT):  4.54 cm    SHUNTS MV Area (plan): 4.05 cm    Systemic VTI:  0.11 m MV Decel Time:  167 msec    Systemic Diam: 2.10 cm MV E velocity: 55.70 cm/s MV A velocity: 127.00 cm/s MV E/A ratio:  0.44 Jenkins Rouge MD Electronically signed by Jenkins Rouge MD Signature Date/Time: 03/10/2021/11:35:47 AM    Final     Scheduled Meds:  acetaminophen  500 mg Oral BID   aspirin EC  81 mg Oral Daily   budesonide  2 mL Nebulization Daily   carvedilol  3.125 mg Oral BID WC   cholecalciferol  1,000 Units Oral Daily   divalproex  500 mg Oral QHS   melatonin  5 mg Oral QHS   pantoprazole  40 mg Oral Q breakfast   predniSONE  5 mg Oral Daily   rOPINIRole  3 mg Oral QHS   simvastatin  40 mg Oral QHS   tolvaptan  15 mg Oral Once   vitamin B-12  100 mcg Oral Daily   zonisamide  100 mg Oral Daily   Continuous Infusions:   LOS: 1 day   Time spent: 35 minutes  Signature  Lala Lund M.D on 03/11/2021 at 10:52 AM   -  To page  go to [email or web address]

## 2021-03-11 NOTE — Progress Notes (Signed)
[facility] hospital[facility] notiefed that patient was getting agitated not able to orient patient; Patient felt that he need to move to another bed because the bed he was in wan't his. He became agitated when I tried to orient him that he was in his room. He began to take his gown and heart monitor off which he had done severa times. New orders received will contine to monitor. Arthor Captain LPN

## 2021-03-12 ENCOUNTER — Other Ambulatory Visit (HOSPITAL_COMMUNITY): Payer: Medicare Other

## 2021-03-12 DIAGNOSIS — S22089D Unspecified fracture of T11-T12 vertebra, subsequent encounter for fracture with routine healing: Secondary | ICD-10-CM | POA: Diagnosis not present

## 2021-03-12 DIAGNOSIS — I5042 Chronic combined systolic (congestive) and diastolic (congestive) heart failure: Secondary | ICD-10-CM | POA: Diagnosis not present

## 2021-03-12 DIAGNOSIS — I110 Hypertensive heart disease with heart failure: Secondary | ICD-10-CM | POA: Diagnosis not present

## 2021-03-12 DIAGNOSIS — J4520 Mild intermittent asthma, uncomplicated: Secondary | ICD-10-CM | POA: Diagnosis not present

## 2021-03-12 DIAGNOSIS — W19XXXD Unspecified fall, subsequent encounter: Secondary | ICD-10-CM | POA: Diagnosis not present

## 2021-03-12 DIAGNOSIS — Z7401 Bed confinement status: Secondary | ICD-10-CM | POA: Diagnosis not present

## 2021-03-12 DIAGNOSIS — S22080D Wedge compression fracture of T11-T12 vertebra, subsequent encounter for fracture with routine healing: Secondary | ICD-10-CM | POA: Diagnosis not present

## 2021-03-12 DIAGNOSIS — R2689 Other abnormalities of gait and mobility: Secondary | ICD-10-CM | POA: Diagnosis not present

## 2021-03-12 DIAGNOSIS — R4701 Aphasia: Secondary | ICD-10-CM | POA: Diagnosis not present

## 2021-03-12 DIAGNOSIS — M2550 Pain in unspecified joint: Secondary | ICD-10-CM | POA: Diagnosis not present

## 2021-03-12 DIAGNOSIS — I480 Paroxysmal atrial fibrillation: Secondary | ICD-10-CM | POA: Diagnosis not present

## 2021-03-12 DIAGNOSIS — G459 Transient cerebral ischemic attack, unspecified: Secondary | ICD-10-CM | POA: Diagnosis not present

## 2021-03-12 DIAGNOSIS — M6281 Muscle weakness (generalized): Secondary | ICD-10-CM | POA: Diagnosis not present

## 2021-03-12 DIAGNOSIS — R1312 Dysphagia, oropharyngeal phase: Secondary | ICD-10-CM | POA: Diagnosis not present

## 2021-03-12 DIAGNOSIS — R262 Difficulty in walking, not elsewhere classified: Secondary | ICD-10-CM | POA: Diagnosis not present

## 2021-03-12 DIAGNOSIS — R471 Dysarthria and anarthria: Secondary | ICD-10-CM | POA: Diagnosis not present

## 2021-03-12 DIAGNOSIS — E119 Type 2 diabetes mellitus without complications: Secondary | ICD-10-CM | POA: Diagnosis not present

## 2021-03-12 DIAGNOSIS — I679 Cerebrovascular disease, unspecified: Secondary | ICD-10-CM | POA: Diagnosis not present

## 2021-03-12 LAB — COMPREHENSIVE METABOLIC PANEL
ALT: 13 U/L (ref 0–44)
AST: 19 U/L (ref 15–41)
Albumin: 3.3 g/dL — ABNORMAL LOW (ref 3.5–5.0)
Alkaline Phosphatase: 87 U/L (ref 38–126)
Anion gap: 8 (ref 5–15)
BUN: 16 mg/dL (ref 8–23)
CO2: 23 mmol/L (ref 22–32)
Calcium: 9 mg/dL (ref 8.9–10.3)
Chloride: 103 mmol/L (ref 98–111)
Creatinine, Ser: 0.91 mg/dL (ref 0.61–1.24)
GFR, Estimated: >60 mL/min (ref >60)
Glucose, Bld: 119 mg/dL — ABNORMAL HIGH (ref 70–99)
Potassium: 3.7 mmol/L (ref 3.5–5.1)
Sodium: 134 mmol/L — ABNORMAL LOW (ref 135–145)
Total Bilirubin: 0.6 mg/dL (ref 0.3–1.2)
Total Protein: 6.3 g/dL — ABNORMAL LOW (ref 6.5–8.1)

## 2021-03-12 LAB — CBC WITH DIFFERENTIAL/PLATELET
Abs Immature Granulocytes: 0.06 10*3/uL (ref 0.00–0.07)
Basophils Absolute: 0.1 10*3/uL (ref 0.0–0.1)
Basophils Relative: 1 %
Eosinophils Absolute: 0.1 10*3/uL (ref 0.0–0.5)
Eosinophils Relative: 1 %
HCT: 36.7 % — ABNORMAL LOW (ref 39.0–52.0)
Hemoglobin: 11.3 g/dL — ABNORMAL LOW (ref 13.0–17.0)
Immature Granulocytes: 1 %
Lymphocytes Relative: 10 %
Lymphs Abs: 1.3 10*3/uL (ref 0.7–4.0)
MCH: 26.4 pg (ref 26.0–34.0)
MCHC: 30.8 g/dL (ref 30.0–36.0)
MCV: 85.7 fL (ref 80.0–100.0)
Monocytes Absolute: 1.2 10*3/uL — ABNORMAL HIGH (ref 0.1–1.0)
Monocytes Relative: 9 %
Neutro Abs: 10.3 10*3/uL — ABNORMAL HIGH (ref 1.7–7.7)
Neutrophils Relative %: 78 %
Platelets: 494 10*3/uL — ABNORMAL HIGH (ref 150–400)
RBC: 4.28 MIL/uL (ref 4.22–5.81)
RDW: 18.3 % — ABNORMAL HIGH (ref 11.5–15.5)
WBC: 13 10*3/uL — ABNORMAL HIGH (ref 4.0–10.5)
nRBC: 0 % (ref 0.0–0.2)

## 2021-03-12 LAB — MAGNESIUM: Magnesium: 2.3 mg/dL (ref 1.7–2.4)

## 2021-03-12 LAB — BRAIN NATRIURETIC PEPTIDE: B Natriuretic Peptide: 23 pg/mL (ref 0.0–100.0)

## 2021-03-12 MED ORDER — CARVEDILOL 3.125 MG PO TABS: 3.1250 mg | ORAL_TABLET | Freq: Two times a day (BID) | ORAL | Status: AC

## 2021-03-12 MED ORDER — TRAMADOL HCL 50 MG PO TABS: 50.0000 mg | ORAL_TABLET | Freq: Every day | ORAL | 0 refills | Status: DC | PRN

## 2021-03-12 MED ORDER — ACETAMINOPHEN 325 MG PO TABS: 650.0000 mg | ORAL_TABLET | Freq: Four times a day (QID) | ORAL | 2 refills | Status: AC | PRN

## 2021-03-12 MED ORDER — DIVALPROEX SODIUM 500 MG PO DR TAB: 500.0000 mg | DELAYED_RELEASE_TABLET | Freq: Every day | ORAL | Status: DC

## 2021-03-12 NOTE — Discharge Instructions (Signed)
Follow with Primary MD Seward Carol, MD / SNF Physician  Get CBC, CMP,  checked  by Primary MD next visit.    Activity: As tolerated with Full fall precautions use walker/cane & assistance as needed   Disposition SNF   Diet: Heart Healthy/Carb modified .   On your next visit with your primary care physician please Get Medicines reviewed and adjusted.   Please request your Prim.MD to go over all Hospital Tests and Procedure/Radiological results at the follow up, please get all Hospital records sent to your Prim MD by signing hospital release before you go home.   If you experience worsening of your admission symptoms, develop shortness of breath, life threatening emergency, suicidal or homicidal thoughts you must seek medical attention immediately by calling 911 or calling your MD immediately  if symptoms less severe.  You Must read complete instructions/literature along with all the possible adverse reactions/side effects for all the Medicines you take and that have been prescribed to you. Take any new Medicines after you have completely understood and accpet all the possible adverse reactions/side effects.   Do not drive, operating heavy machinery, perform activities at heights, swimming or participation in water activities or provide baby sitting services if your were admitted for syncope or siezures until you have seen by Primary MD or a Neurologist and advised to do so again.  Do not drive when taking Pain medications.    Do not take more than prescribed Pain, Sleep and Anxiety Medications  Special Instructions: If you have smoked or chewed Tobacco  in the last 2 yrs please stop smoking, stop any regular Alcohol  and or any Recreational drug use.  Wear Seat belts while driving.   Please note  You were cared for by a hospitalist during your hospital stay. If you have any questions about your discharge medications or the care you received while you were in the hospital after  you are discharged, you can call the unit and asked to speak with the hospitalist on call if the hospitalist that took care of you is not available. Once you are discharged, your primary care physician will handle any further medical issues. Please note that NO REFILLS for any discharge medications will be authorized once you are discharged, as it is imperative that you return to your primary care physician (or establish a relationship with a primary care physician if you do not have one) for your aftercare needs so that they can reassess your need for medications and monitor your lab values.

## 2021-03-12 NOTE — Discharge Summary (Addendum)
Physician Discharge Summary  [name] QIW:[id number] DOB: 1932/02/07 DOA: 03/08/2021  PCP: Seward Carol, MD  Admit date: 03/08/2021 Discharge date: 03/12/2021  Admitted From: Home Disposition:  SNF  Recommendations for Outpatient Follow-up:  Please obtain BMP/CBC in one week Follow with neurology as outpatient in 1 month continue TLSO brace  when out of bed. Please monitor B12 level closely  Discharge Condition:Stable CODE STATUS:FULL Diet recommendation: Heart Healthy / Carb Modified     Brief/Interim Summary:  Strokelike symptoms: -Could be secondary to TIA versus seizures versus complex migraine versus delirium -CTA head and neck, MRI brain: Negative for acute findings. -Echocardiogram stable with improved EF as compared to 2018.  LDL: 43, A1c: 6.3 -Neurology on board-appreciate help, stroke has been ruled out this admission, findings may be related to TIA, versus seizures, and less likely complex migraine, certainly has been worsening during hospital stay by some hospital delirium . -Mentation has improved, speech has improved as well . -As discussed with neurology, patient used to be on Zonegran, but he stopped taking it as it did make him sleepy, so it has been changed to Depakote 500 mg at bedtime, and no further Zonegran on discharge.   -EEG shows moderate diffuse slowing indicative of global cerebral dysfunction but no epileptiform abnormalities were noted. -PT/OT recommended SNF -On fall precautions/seizure precautions -Patient with NO history of A. fib in the past, but he had a trial of warfarin due to recurrent ischemic events, at this point no recommendation for full anticoagulation especially with history of GI bleed, as well full anticoagulation did not prevent these events, recommendation to continue with aspirin only.   Type 2 diabetes: A1c 6.3%, on metformin. Marland Kitchen   Hyponatremia - mild, likely mild SIADH, resolved with 1 dose Samsca.    History of esophageal  stricture: -Followed by GI outpatient. -EGD attempted on 02/28/2021 had to be aborted due to inability to pass through tortuous stenotic esophagus.  Chronic combined systolic and diastolic CHF: Echo from 4174 showed ejection fraction of 45% with grade 1 diastolic dysfunction. EF now better at 55%, appears euvolemic on exam.  Strict INO's.  Had asymptomatic, nonsustained 6 beat run of V. tach on 03/11/2021 in the morning, placed on beta-blocker, electrolytes stable, asymptomatic.  Monitor   Cough variant asthma: -Stable.  No wheezing on exam.  Continue home inhalers. -Continue prednisone 5 mg daily   T12 compression fracture: -Chronic. -Reviewed CT imaging. continue TLSO brace placed in ED when out of bed.   Hyperlipidemia: Continue statin   Normocytic anemia: -H&H 10.6/34.9.  Continue to monitor.   Chronic thrombocytosis: Platelet 467.   Metabolic encephalopathy -hospital-acquired delirium due to unfamiliar settings and advanced age, supportive care minimize narcotics and benzodiazepines, he is improving gradually.     Discharge Diagnoses:  Principal Problem:   Expressive aphasia Active Problems:   History of esophageal strciture   Diabetes mellitus type II, non insulin dependent (HCC)   HLD (hyperlipidemia)   Cough variant asthma   PAF (paroxysmal atrial fibrillation) (HCC)   Chronic combined systolic and diastolic CHF (congestive heart failure) ([name])   T12 compression fracture, initial encounter [facility])   Aphasia    Discharge Instructions  Discharge Instructions     Ambulatory referral to Neurology   Complete by: As directed    Follow up with stroke clinic NP Frann Rider at [facility] in about 4 weeks. Thanks.   Discharge instructions   Complete by: As directed    Follow with Primary MD Seward Carol, MD /  SNF Physician  Get CBC, CMP,  checked  by Primary MD next visit.    Activity: As tolerated with Full fall precautions use walker/cane & assistance as  needed   Disposition SNF   Diet: Heart Healthy/Carb modified .   On your next visit with your primary care physician please Get Medicines reviewed and adjusted.   Please request your Prim.MD to go over all Hospital Tests and Procedure/Radiological results at the follow up, please get all Hospital records sent to your Prim MD by signing hospital release before you go home.   If you experience worsening of your admission symptoms, develop shortness of breath, life threatening emergency, suicidal or homicidal thoughts you must seek medical attention immediately by calling 911 or calling your MD immediately  if symptoms less severe.  You Must read complete instructions/literature along with all the possible adverse reactions/side effects for all the Medicines you take and that have been prescribed to you. Take any new Medicines after you have completely understood and accpet all the possible adverse reactions/side effects.   Do not drive, operating heavy machinery, perform activities at heights, swimming or participation in water activities or provide baby sitting services if your were admitted for syncope or siezures until you have seen by Primary MD or a Neurologist and advised to do so again.  Do not drive when taking Pain medications.    Do not take more than prescribed Pain, Sleep and Anxiety Medications  Special Instructions: If you have smoked or chewed Tobacco  in the last 2 yrs please stop smoking, stop any regular Alcohol  and or any Recreational drug use.  Wear Seat belts while driving.   Please note  You were cared for by a hospitalist during your hospital stay. If you have any questions about your discharge medications or the care you received while you were in the hospital after you are discharged, you can call the unit and asked to speak with the hospitalist on call if the hospitalist that took care of you is not available. Once you are discharged, your primary care  physician will handle any further medical issues. Please note that NO REFILLS for any discharge medications will be authorized once you are discharged, as it is imperative that you return to your primary care physician (or establish a relationship with a primary care physician if you do not have one) for your aftercare needs so that they can reassess your need for medications and monitor your lab values.   Increase activity slowly   Complete by: As directed       Allergies as of 03/12/2021       Reactions   Tape Other (See Comments)   SKIN IS SENSITIVE; PLEASE USE PAPER TAPE; SKIN BRUISES AND TEARS EASILY!!        Medication List     STOP taking these medications    diphenhydramine-acetaminophen 25-500 MG Tabs tablet Commonly known as: TYLENOL PM   ibuprofen 200 MG tablet Commonly known as: ADVIL   zonisamide 100 MG capsule Commonly known as: ZONEGRAN       TAKE these medications    acetaminophen 325 MG tablet Commonly known as: Tylenol Take 2 tablets (650 mg total) by mouth every 6 (six) hours as needed for mild pain, fever or headache. What changed:  medication strength how much to take reasons to take this   albuterol 108 (90 Base) MCG/ACT inhaler Commonly known as: VENTOLIN HFA Inhale 2 puffs into the lungs every 6 (six) hours  as needed for wheezing or shortness of breath.   aspirin EC 81 MG tablet Take 1 tablet (81 mg total) by mouth daily. Swallow whole.   budesonide 0.5 MG/2ML nebulizer solution Commonly known as: PULMICORT Take 2 mLs by nebulization daily. 50ml by nebulization daily And 71ml by nebulization daily as needed for shortness of breath/wheezing   carvedilol 3.125 MG tablet Commonly known as: COREG Take 1 tablet (3.125 mg total) by mouth 2 (two) times daily with a meal.   cholecalciferol 25 MCG (1000 UNIT) tablet Commonly known as: VITAMIN D Take 1,000 Units by mouth daily.   cyclobenzaprine 5 MG tablet Commonly known as: FLEXERIL Take 1  tablet (5 mg total) by mouth at bedtime for 10 doses.   divalproex 500 MG DR tablet Commonly known as: DEPAKOTE Take 1 tablet (500 mg total) by mouth at bedtime.   fluticasone 50 MCG/ACT nasal spray Commonly known as: FLONASE Place 1 spray into both nostrils daily as needed for allergies or rhinitis.   Melatonin 5 MG Caps Take 5 mg by mouth at bedtime.   metFORMIN 500 MG tablet Commonly known as: GLUCOPHAGE Take 500 mg by mouth daily.   pantoprazole 40 MG tablet Commonly known as: PROTONIX Take 1 tablet (40 mg total) by mouth daily with breakfast.   polyvinyl alcohol 1.4 % ophthalmic solution Commonly known as: LIQUIFILM TEARS Place 1 drop into both eyes as needed for dry eyes.   predniSONE 10 MG tablet Commonly known as: DELTASONE Take 5 mg by mouth daily.   rOPINIRole 3 MG tablet Commonly known as: REQUIP Take 1 tablet (3 mg total) by mouth at bedtime.   simvastatin 40 MG tablet Commonly known as: ZOCOR Take 1 tablet (40 mg total) by mouth daily. What changed: when to take this   traMADol 50 MG tablet Commonly known as: ULTRAM Take 1 tablet (50 mg total) by mouth daily as needed for moderate pain.   VITAMIN B-12 PO Take 1 tablet by mouth daily.   vitamin C 500 MG tablet Commonly known as: ASCORBIC ACID Take 500 mg by mouth daily.   ZINC PO Take 1 tablet by mouth daily.        Follow-up Information     Frann Rider, NP. Schedule an appointment as soon as possible for a visit in 1 month(s).   Specialty: Neurology Why: stroke clinic Contact information: [address] [place] [address] [phone]                Allergies  Allergen Reactions   Tape Other (See Comments)    SKIN IS SENSITIVE; PLEASE USE PAPER TAPE; SKIN BRUISES AND TEARS EASILY!!    Consultations: Neurology    Procedures/Studies: CT ANGIO HEAD NECK W WO CM  Result Date: 03/09/2021 CLINICAL DATA:  Initial evaluation for acute TIA. EXAM: CT ANGIOGRAPHY HEAD AND  NECK TECHNIQUE: Multidetector CT imaging of the head and neck was performed using the standard protocol during bolus administration of intravenous contrast. Multiplanar CT image reconstructions and MIPs were obtained to evaluate the vascular anatomy. Carotid stenosis measurements (when applicable) are obtained utilizing NASCET criteria, using the distal internal carotid diameter as the denominator. RADIATION DOSE REDUCTION: This exam was performed according to the departmental dose-optimization program which includes automated exposure control, adjustment of the mA and/or kV according to patient size and/or use of iterative reconstruction technique. CONTRAST:  67mL OMNIPAQUE IOHEXOL 350 MG/ML SOLN COMPARISON:  Prior head CT from 03/08/2021. FINDINGS: CTA NECK FINDINGS Aortic arch: Visualized aortic  arch normal caliber with normal branch pattern. Mild-to-moderate plaque about the arch itself. No significant stenosis about the origin of the great vessels. Right carotid system: Right common and internal carotid arteries patent without stenosis or dissection. Mild for age plaque about the right carotid bulb without stenosis. Left carotid system: Left common and internal carotid arteries patent without stenosis or dissection. Mild for age plaque about the left carotid bulb without stenosis. Vertebral arteries: Both vertebral arteries arise from subclavian arteries. No proximal subclavian artery stenosis. Vertebral arteries patent without stenosis or dissection. Skeleton: No discrete or worrisome osseous lesions. Other neck: No other acute soft tissue abnormality within the neck. Upper chest: Visualized upper chest demonstrates no acute finding. Review of the MIP images confirms the above findings CTA HEAD FINDINGS Anterior circulation: Both internal carotid arteries patent to the termini without stenosis. A1 segments patent bilaterally. Normal anterior communicating artery complex. Anterior cerebral arteries patent  without stenosis. No M1 stenosis or occlusion. Normal MCA bifurcations. Distal MCA branches perfused and symmetric. Posterior circulation: Both V4 segments widely patent to the vertebrobasilar junction. Both PICA patent. Basilar widely patent to its distal aspect without stenosis. Superior cerebellar and posterior cerebral arteries widely patent bilaterally. Venous sinuses: Grossly patent allowing for timing the contrast bolus and motion. Anatomic variants: None significant.  No aneurysm. Review of the MIP images confirms the above findings IMPRESSION: 1. Negative CTA of the head and neck. No large vessel occlusion, hemodynamically significant stenosis, or other acute vascular abnormality. 2. Mild for age atheromatous disease about the carotid bifurcations and carotid siphons without stenosis. Electronically Signed   By: Jeannine Boga M.D.   On: 03/09/2021 03:44   DG Chest 2 View  Result Date: 03/03/2021 CLINICAL DATA:  Status post fall with pain. EXAM: CHEST - 2 VIEW COMPARISON:  August 21, 2018 FINDINGS: The heart size and mediastinal contours are within normal limits. Both lungs are clear. The visualized skeletal structures are unremarkable. IMPRESSION: No active cardiopulmonary disease. Electronically Signed   By: Abelardo Diesel M.D.   On: 03/03/2021 12:14   DG Lumbar Spine Complete  Result Date: 03/03/2021 CLINICAL DATA:  Status post fall with back pain. EXAM: LUMBAR SPINE - COMPLETE 4+ VIEW COMPARISON:  CT abdomen pelvis August 18, 2020 FINDINGS: There is 50% compression deformity of T10, T11 and T12 new compared to prior CT scan of July 2022. There is 30% compression deformity of L4 new compared to prior CT scan of July 2022. Scoliosis of the spine is identified. Bowel markers are noted. Extensive bowel content is identified in the colon. IMPRESSION: 1. Indeterminate age fractures of T10, T11, T12 and L4 new compared to prior CT scan of July 2022. Electronically Signed   By: Abelardo Diesel M.D.   On:  03/03/2021 12:13   CT HEAD WO CONTRAST  Result Date: 03/08/2021 CLINICAL DATA:  Fall on 01/16, pain EXAM: CT HEAD WITHOUT CONTRAST CT CERVICAL SPINE WITHOUT CONTRAST TECHNIQUE: Multidetector CT imaging of the head and cervical spine was performed following the standard protocol without intravenous contrast. Multiplanar CT image reconstructions of the cervical spine were also generated. RADIATION DOSE REDUCTION: This exam was performed according to the departmental dose-optimization program which includes automated exposure control, adjustment of the mA and/or kV according to patient size and/or use of iterative reconstruction technique. COMPARISON:  CT brain 04/01/2019, CT cervical spine 12/23/2017 FINDINGS: CT HEAD FINDINGS Brain: No acute territorial infarction, hemorrhage, or intracranial mass. Moderate atrophy. Moderate chronic small vessel ischemic changes of the white  matter. Small chronic infarct in the right thalamic capsular region. Ventricle are stable to minimally enlarged compared to prior. Vascular: No hyperdense vessels.  Carotid vascular calcification Skull: Normal. Negative for fracture or focal lesion. Sinuses/Orbits: Mucosal thickening in the ethmoid maxillary and frontal sinuses Other: None CT CERVICAL SPINE FINDINGS Alignment: Straightening of the cervical spine. 3 mm anterolisthesis C4 on C5. Facet alignment within normal limits. Skull base and vertebrae: No acute fracture. No primary bone lesion or focal pathologic process. Soft tissues and spinal canal: No prevertebral fluid or swelling. No visible canal hematoma. Disc levels: Degenerative changes at multiple levels. Partial ankylosis C5-C6. Marked disc space narrowing at C6-C7. Facet degenerative changes at multiple levels with foraminal stenosis. Upper chest: Negative. Other: None IMPRESSION: 1. No CT evidence for acute intracranial abnormality. Atrophy and chronic small vessel ischemic changes of the white matter. Ventricles slightly  more prominent compared to prior head CT potentially due to slight atrophy progression. 2. Straightening of the cervical spine with degenerative changes. No acute osseous abnormality is seen. Electronically Signed   By: Donavan Foil M.D.   On: 03/08/2021 21:29   CT CERVICAL SPINE WO CONTRAST  Result Date: 03/08/2021 CLINICAL DATA:  Fall on 01/16, pain EXAM: CT HEAD WITHOUT CONTRAST CT CERVICAL SPINE WITHOUT CONTRAST TECHNIQUE: Multidetector CT imaging of the head and cervical spine was performed following the standard protocol without intravenous contrast. Multiplanar CT image reconstructions of the cervical spine were also generated. RADIATION DOSE REDUCTION: This exam was performed according to the departmental dose-optimization program which includes automated exposure control, adjustment of the mA and/or kV according to patient size and/or use of iterative reconstruction technique. COMPARISON:  CT brain 04/01/2019, CT cervical spine 12/23/2017 FINDINGS: CT HEAD FINDINGS Brain: No acute territorial infarction, hemorrhage, or intracranial mass. Moderate atrophy. Moderate chronic small vessel ischemic changes of the white matter. Small chronic infarct in the right thalamic capsular region. Ventricle are stable to minimally enlarged compared to prior. Vascular: No hyperdense vessels.  Carotid vascular calcification Skull: Normal. Negative for fracture or focal lesion. Sinuses/Orbits: Mucosal thickening in the ethmoid maxillary and frontal sinuses Other: None CT CERVICAL SPINE FINDINGS Alignment: Straightening of the cervical spine. 3 mm anterolisthesis C4 on C5. Facet alignment within normal limits. Skull base and vertebrae: No acute fracture. No primary bone lesion or focal pathologic process. Soft tissues and spinal canal: No prevertebral fluid or swelling. No visible canal hematoma. Disc levels: Degenerative changes at multiple levels. Partial ankylosis C5-C6. Marked disc space narrowing at C6-C7. Facet  degenerative changes at multiple levels with foraminal stenosis. Upper chest: Negative. Other: None IMPRESSION: 1. No CT evidence for acute intracranial abnormality. Atrophy and chronic small vessel ischemic changes of the white matter. Ventricles slightly more prominent compared to prior head CT potentially due to slight atrophy progression. 2. Straightening of the cervical spine with degenerative changes. No acute osseous abnormality is seen. Electronically Signed   By: Donavan Foil M.D.   On: 03/08/2021 21:29   MR BRAIN WO CONTRAST  Result Date: 03/09/2021 CLINICAL DATA:  88 year old male status post fall on January 16th. Pain and altered mental status. TIA. EXAM: MRI HEAD WITHOUT CONTRAST TECHNIQUE: Multiplanar, multiecho pulse sequences of the brain and surrounding structures were obtained without intravenous contrast. COMPARISON:  Brain MRI 04/23/2018. FINDINGS: The examination had to be discontinued prior to completion by patient request. Only coronal T2 weighted imaging was not obtained, although some sequences are mildly degraded by motion. Brain: No restricted diffusion or evidence of acute infarction.  Moderate T2 heterogeneity throughout the bilateral deep gray matter nuclei, appears to be combination of enlarged perivascular spaces and chronic lacunar infarcts. Left thalamic lacune is chronic but new since 2020. Chronic right posterior deep white matter capsule chronic lacunar ischemia. And there is mild chronic Wallerian degeneration in the right brainstem. No cortical encephalomalacia or definite chronic cerebral blood products. Moderate Patchy and confluent bilateral cerebral white matter T2 and FLAIR hyperintensity also appears increased since 2020. Vascular: Major intracranial vascular flow voids are stable. Mild generalized intracranial artery ectasia. Skull and upper cervical spine: Negative visible cervical spine. Normal bone marrow signal. Sinuses/Orbits: Orbits are stable and negative.  Chronic paranasal sinus disease, mildly improved since 2020. Other: Mastoid air cells are well aerated, with regressed left side mastoid effusion since 2020. negative visible scalp and face. IMPRESSION: 1. No acute intracranial abnormality. The examinationIs mildly motion degraded, and no coronal T2 weighted imaging was obtained. 2. Moderate for age chronic small vessel disease, progressed in the left thalamus and bilateral cerebral white matter since 2020. Electronically Signed   By: Genevie Ann M.D.   On: 03/09/2021 04:35   CT T-SPINE NO CHARGE  Result Date: 03/08/2021 CLINICAL DATA:  Low back pain EXAM: CT THORACIC AND LUMBAR SPINE WITHOUT CONTRAST TECHNIQUE: Multidetector CT imaging of the thoracic and lumbar spine was performed without contrast. Multiplanar CT image reconstructions were also generated. RADIATION DOSE REDUCTION: This exam was performed according to the departmental dose-optimization program which includes automated exposure control, adjustment of the mA and/or kV according to patient size and/or use of iterative reconstruction technique. COMPARISON:  None. FINDINGS: CT THORACIC SPINE FINDINGS Alignment: Normal Vertebrae: Slight compression deformity at T12 compatible with slight compression fracture. No additional acute bony abnormality or focal bone lesion. Paraspinal and other soft tissues: Normal Disc levels: Early disc space narrowing and spurring. CT LUMBAR SPINE FINDINGS Segmentation: 5 lumbar type vertebrae. Alignment: No subluxation. Convex rightward scoliosis centered in the upper to mid lumbar spine. Vertebrae: No fracture or focal bone lesion. Paraspinal and other soft tissues: Normal Disc levels: Advanced diffuse degenerative disc disease and facet disease. IMPRESSION: CT THORACIC SPINE IMPRESSION Slight compression fracture at T12. No retropulsed fracture fragments. CT LUMBAR SPINE IMPRESSION No acute bony abnormality. Convex rightward scoliosis. Moderate to advanced degenerative  disc and facet disease. Electronically Signed   By: Rolm Baptise M.D.   On: 03/08/2021 21:25   CT L-SPINE NO CHARGE  Result Date: 03/08/2021 CLINICAL DATA:  Low back pain EXAM: CT THORACIC AND LUMBAR SPINE WITHOUT CONTRAST TECHNIQUE: Multidetector CT imaging of the thoracic and lumbar spine was performed without contrast. Multiplanar CT image reconstructions were also generated. RADIATION DOSE REDUCTION: This exam was performed according to the departmental dose-optimization program which includes automated exposure control, adjustment of the mA and/or kV according to patient size and/or use of iterative reconstruction technique. COMPARISON:  None. FINDINGS: CT THORACIC SPINE FINDINGS Alignment: Normal Vertebrae: Slight compression deformity at T12 compatible with slight compression fracture. No additional acute bony abnormality or focal bone lesion. Paraspinal and other soft tissues: Normal Disc levels: Early disc space narrowing and spurring. CT LUMBAR SPINE FINDINGS Segmentation: 5 lumbar type vertebrae. Alignment: No subluxation. Convex rightward scoliosis centered in the upper to mid lumbar spine. Vertebrae: No fracture or focal bone lesion. Paraspinal and other soft tissues: Normal Disc levels: Advanced diffuse degenerative disc disease and facet disease. IMPRESSION: CT THORACIC SPINE IMPRESSION Slight compression fracture at T12. No retropulsed fracture fragments. CT LUMBAR SPINE IMPRESSION No acute bony abnormality.  Convex rightward scoliosis. Moderate to advanced degenerative disc and facet disease. Electronically Signed   By: Rolm Baptise M.D.   On: 03/08/2021 21:25   EEG adult  Result Date: 03/09/2021 Derek Jack, MD     03/09/2021 12:54 PM Routine EEG Report [name] is a 88 y.o. male with a history of transient expressive aphasia who is undergoing an EEG to evaluate for seizures. Report: This EEG was acquired with electrodes placed according to the International 10-20 electrode system  (including Fp1, Fp2, F3, F4, C3, C4, P3, P4, O1, O2, T3, T4, T5, T6, A1, A2, Fz, Cz, Pz). The following electrodes were missing or displaced: none. The occipital dominant rhythm was 5-6 Hz. This activity is reactive to stimulation. Drowsiness was manifested by background fragmentation; deeper stages of sleep were identified by K complexes and sleep spindles. There was no focal slowing. There were no interictal epileptiform discharges. Photic stimulation and hyperventilation were not performed. Impression and clinical correlation: This EEG was obtained while awake and asleep and is abnormal due to moderate diffuse slowing indicative of global cerebral dysfunction. Epileptiform abnormalities were not seen during this recording. Su Monks, MD [facility] [phone] If 7pm- 7am, please page neurology on call as listed in [place].   ECHOCARDIOGRAM COMPLETE  Result Date: 03/10/2021    ECHOCARDIOGRAM REPORT   Patient Name:   TORRIAN CANION Date of Exam: 03/10/2021 Medical Rec #:  [id number]     Height:       66.0 in Accession #:    [id number]    Weight:       144.6 lb Date of Birth:  1931-02-23      BSA:          1.742 m Patient Age:    88 years      BP:           146/90 mmHg Patient Gender: M             HR:           84 bpm. Exam Location:  Inpatient Procedure: 2D Echo, 3D Echo, Cardiac Doppler, Color Doppler and Strain Analysis Indications:    TIA G45.9  History:        Patient has prior history of Echocardiogram examinations, most                 recent 02/24/2016. Risk Factors:Diabetes and Dyslipidemia.                 Paroxysmal atrial fibrillation not on anticoagulation, chronic                 systolic CHF, Normocytic anemia, chronic GI bleeds.  Sonographer:    Darlina Sicilian RDCS Referring Phys: [id number] VISHAL R PATEL  Sonographer Comments: Global longitudinal strain was attempted. IMPRESSIONS  1. Left ventricular ejection fraction, by estimation, is 55 to 60%. The left ventricle has normal function.  The left ventricle has no regional wall motion abnormalities. Left ventricular diastolic parameters are consistent with Grade I diastolic dysfunction (impaired relaxation). The average left ventricular global longitudinal strain is -16.6 %. The global longitudinal strain is normal.  2. Right ventricular systolic function is normal. The right ventricular size is normal.  3. The mitral valve is abnormal. Trivial mitral valve regurgitation. No evidence of mitral stenosis.  4. The aortic valve is tricuspid. Aortic valve regurgitation is not visualized. No aortic stenosis is present.  5. Aortic dilatation noted. There is mild dilatation of the aortic root,  measuring 40 mm.  6. The inferior vena cava is normal in size with greater than 50% respiratory variability, suggesting right atrial pressure of 3 mmHg. FINDINGS  Left Ventricle: Left ventricular ejection fraction, by estimation, is 55 to 60%. The left ventricle has normal function. The left ventricle has no regional wall motion abnormalities. The average left ventricular global longitudinal strain is -16.6 %. The global longitudinal strain is normal. The left ventricular internal cavity size was normal in size. There is no left ventricular hypertrophy. Left ventricular diastolic parameters are consistent with Grade I diastolic dysfunction (impaired relaxation). Right Ventricle: The right ventricular size is normal. No increase in right ventricular wall thickness. Right ventricular systolic function is normal. Left Atrium: Left atrial size was normal in size. Right Atrium: Right atrial size was normal in size. Pericardium: There is no evidence of pericardial effusion. Mitral Valve: The mitral valve is abnormal. There is mild thickening of the mitral valve leaflet(s). There is mild calcification of the mitral valve leaflet(s). Trivial mitral valve regurgitation. No evidence of mitral valve stenosis. Tricuspid Valve: The tricuspid valve is normal in structure. Tricuspid  valve regurgitation is not demonstrated. No evidence of tricuspid stenosis. Aortic Valve: The aortic valve is tricuspid. Aortic valve regurgitation is not visualized. No aortic stenosis is present. Pulmonic Valve: The pulmonic valve was normal in structure. Pulmonic valve regurgitation is not visualized. No evidence of pulmonic stenosis. Aorta: Aortic dilatation noted. There is mild dilatation of the aortic root, measuring 40 mm. Venous: The inferior vena cava is normal in size with greater than 50% respiratory variability, suggesting right atrial pressure of 3 mmHg. IAS/Shunts: No atrial level shunt detected by color flow Doppler.  LEFT VENTRICLE PLAX 2D LVIDd:         4.50 cm   Diastology LVIDs:         3.00 cm   LV e' medial:    4.51 cm/s LV PW:         0.80 cm   LV E/e' medial:  12.4 LV IVS:        0.90 cm   LV e' lateral:   8.17 cm/s LVOT diam:     2.10 cm   LV E/e' lateral: 6.8 LV SV:         37 LV SV Index:   21        2D Longitudinal Strain LVOT Area:     3.46 cm  2D Strain GLS Avg:     -16.6 %                           3D Volume EF:                          3D EF:        54 %                          LV EDV:       111 ml                          LV ESV:       51 ml                          LV SV:        60 ml RIGHT VENTRICLE  RV S prime:     18.50 cm/s TAPSE (M-mode): 2.6 cm LEFT ATRIUM             Index        RIGHT ATRIUM          Index LA diam:        1.90 cm 1.09 cm/m   RA Area:     9.92 cm LA Vol (A2C):   46.9 ml 26.92 ml/m  RA Volume:   18.60 ml 10.67 ml/m LA Vol (A4C):   48.2 ml 27.66 ml/m LA Biplane Vol: 49.6 ml 28.47 ml/m  AORTIC VALVE LVOT Vmax:   86.70 cm/s LVOT Vmean:  49.100 cm/s LVOT VTI:    0.107 m  AORTA Ao Root diam: 4.00 cm Ao Asc diam:  3.40 cm MITRAL VALVE MV Area (PHT):  4.54 cm    SHUNTS MV Area (plan): 4.05 cm    Systemic VTI:  0.11 m MV Decel Time:  167 msec    Systemic Diam: 2.10 cm MV E velocity: 55.70 cm/s MV A velocity: 127.00 cm/s MV E/A ratio:  0.44 Jenkins Rouge MD  Electronically signed by Jenkins Rouge MD Signature Date/Time: 03/10/2021/11:35:47 AM    Final    CT CHEST ABDOMEN PELVIS WO CONTRAST  Result Date: 03/08/2021 CLINICAL DATA:  Fall on 03/03/2021.  Low back pain.  Abdominal pain. EXAM: CT CHEST, ABDOMEN AND PELVIS WITHOUT CONTRAST TECHNIQUE: Multidetector CT imaging of the chest, abdomen and pelvis was performed following the standard protocol without IV contrast. RADIATION DOSE REDUCTION: This exam was performed according to the departmental dose-optimization program which includes automated exposure control, adjustment of the mA and/or kV according to patient size and/or use of iterative reconstruction technique. COMPARISON:  08/18/2020 FINDINGS: CT CHEST FINDINGS Cardiovascular: Heart is normal size. Aorta is normal caliber. Scattered aortic and coronary artery calcifications. Mediastinum/Nodes: No mediastinal, hilar, or axillary adenopathy. Trachea and esophagus are unremarkable. Thyroid unremarkable. Moderate-sized hiatal hernia. Lungs/Pleura: Lungs are clear. No focal airspace opacities or suspicious nodules. No effusions. Musculoskeletal: No acute bony abnormality. CT ABDOMEN PELVIS FINDINGS Hepatobiliary: No focal hepatic abnormality. Gallbladder unremarkable. Pancreas: No focal abnormality or ductal dilatation. Spleen: Prior splenectomy. Adrenals/Urinary Tract: No adrenal abnormality. No focal renal abnormality. No stones or hydronephrosis. Urinary bladder is unremarkable. Stomach/Bowel: Colonic diverticulosis. No active diverticulitis. No bowel obstruction. Stomach and small bowel grossly unremarkable. Vascular/Lymphatic: Aortic atherosclerosis. No evidence of aneurysm or adenopathy. Reproductive: Mildly prominent prostate with central calcifications. Other: No free fluid or free air. Small left inguinal hernia containing fat. Musculoskeletal: Rightward scoliosis in the lumbar spine. No acute bony abnormality. IMPRESSION: No acute findings in the chest,  abdomen or pelvis. Moderate-sized hiatal hernia. Coronary artery disease, aortic atherosclerosis. Colonic diverticulosis. Electronically Signed   By: Rolm Baptise M.D.   On: 03/08/2021 21:20      Subjective: Patient denies any complaints today, no significant events overnight as discussed with staff  Discharge Exam: Vitals:   03/12/21 0800 03/12/21 0811  BP: 131/83   Pulse:    Resp:    Temp:    SpO2:  97%   Vitals:   03/12/21 0318 03/12/21 0746 03/12/21 0800 03/12/21 0811  BP: 110/70 (!) 148/82 131/83   Pulse: 80 79    Resp: 18 (!) 22    Temp: 98 F (36.7 C) 97.7 F (36.5 C)    TempSrc: Oral Oral    SpO2: 97%   97%  Weight:      Height:  General: Pt is alert, awake, not in acute distress, his speech is slow, but clear. Cardiovascular: RRR, S1/S2 +, no rubs, no gallops Respiratory: CTA bilaterally, no wheezing, no rhonchi Abdominal: Soft, NT, ND, bowel sounds + Extremities: no edema, no cyanosis    The results of significant diagnostics from this hospitalization (including imaging, microbiology, ancillary and laboratory) are listed below for reference.     Microbiology: Recent Results (from the past 240 hour(s))  Resp Panel by RT-PCR (Flu A&B, Covid) Nasopharyngeal Swab     Status: None   Collection Time: 03/08/21  7:30 PM   Specimen: Nasopharyngeal Swab; Nasopharyngeal(NP) swabs in vial transport medium  Result Value Ref Range Status   SARS Coronavirus 2 by RT PCR NEGATIVE NEGATIVE Final    Comment: (NOTE) SARS-CoV-2 target nucleic acids are NOT DETECTED.  The SARS-CoV-2 RNA is generally detectable in upper respiratory specimens during the acute phase of infection. The lowest concentration of SARS-CoV-2 viral copies this assay can detect is 138 copies/mL. A negative result does not preclude SARS-Cov-2 infection and should not be used as the sole basis for treatment or other patient management decisions. A negative result may occur with  improper specimen  collection/handling, submission of specimen other than nasopharyngeal swab, presence of viral mutation(s) within the areas targeted by this assay, and inadequate number of viral copies(<138 copies/mL). A negative result must be combined with clinical observations, patient history, and epidemiological information. The expected result is Negative.  Fact Sheet for Patients:  EntrepreneurPulse.com.au  Fact Sheet for Healthcare Providers:  IncredibleEmployment.be  This test is no t yet approved or cleared by the [place] FDA and  has been authorized for detection and/or diagnosis of SARS-CoV-2 by FDA under an Emergency Use Authorization (EUA). This EUA will remain  in effect (meaning this test can be used) for the duration of the COVID-19 declaration under Section 564(b)(1) of the Act, 21 U.S.C.section 360bbb-3(b)(1), unless the authorization is terminated  or revoked sooner.       Influenza A by PCR NEGATIVE NEGATIVE Final   Influenza B by PCR NEGATIVE NEGATIVE Final    Comment: (NOTE) The Xpert Xpress SARS-CoV-2/FLU/RSV plus assay is intended as an aid in the diagnosis of influenza from Nasopharyngeal swab specimens and should not be used as a sole basis for treatment. Nasal washings and aspirates are unacceptable for Xpert Xpress SARS-CoV-2/FLU/RSV testing.  Fact Sheet for Patients: EntrepreneurPulse.com.au  Fact Sheet for Healthcare Providers: IncredibleEmployment.be  This test is not yet approved or cleared by the [place] FDA and has been authorized for detection and/or diagnosis of SARS-CoV-2 by FDA under an Emergency Use Authorization (EUA). This EUA will remain in effect (meaning this test can be used) for the duration of the COVID-19 declaration under Section 564(b)(1) of the Act, 21 U.S.C. section 360bbb-3(b)(1), unless the authorization is terminated or revoked.  Performed at [facility], [place] [address], [place], [place] [address]   Resp Panel by RT-PCR (Flu A&B, Covid) Nasopharyngeal Swab     Status: None   Collection Time: 03/11/21 11:23 AM   Specimen: Nasopharyngeal Swab; Nasopharyngeal(NP) swabs in vial transport medium  Result Value Ref Range Status   SARS Coronavirus 2 by RT PCR NEGATIVE NEGATIVE Final    Comment: (NOTE) SARS-CoV-2 target nucleic acids are NOT DETECTED.  The SARS-CoV-2 RNA is generally detectable in upper respiratory specimens during the acute phase of infection. The lowest concentration of SARS-CoV-2 viral copies this assay can detect is 138 copies/mL. A negative result  does not preclude SARS-Cov-2 infection and should not be used as the sole basis for treatment or other patient management decisions. A negative result may occur with  improper specimen collection/handling, submission of specimen other than nasopharyngeal swab, presence of viral mutation(s) within the areas targeted by this assay, and inadequate number of viral copies(<138 copies/mL). A negative result must be combined with clinical observations, patient history, and epidemiological information. The expected result is Negative.  Fact Sheet for Patients:  EntrepreneurPulse.com.au  Fact Sheet for Healthcare Providers:  IncredibleEmployment.be  This test is no t yet approved or cleared by the [place] FDA and  has been authorized for detection and/or diagnosis of SARS-CoV-2 by FDA under an Emergency Use Authorization (EUA). This EUA will remain  in effect (meaning this test can be used) for the duration of the COVID-19 declaration under Section 564(b)(1) of the Act, 21 U.S.C.section 360bbb-3(b)(1), unless the authorization is terminated  or revoked sooner.       Influenza A by PCR NEGATIVE NEGATIVE Final   Influenza B by PCR NEGATIVE NEGATIVE Final    Comment: (NOTE) The Xpert Xpress SARS-CoV-2/FLU/RSV plus assay is  intended as an aid in the diagnosis of influenza from Nasopharyngeal swab specimens and should not be used as a sole basis for treatment. Nasal washings and aspirates are unacceptable for Xpert Xpress SARS-CoV-2/FLU/RSV testing.  Fact Sheet for Patients: EntrepreneurPulse.com.au  Fact Sheet for Healthcare Providers: IncredibleEmployment.be  This test is not yet approved or cleared by the [place] FDA and has been authorized for detection and/or diagnosis of SARS-CoV-2 by FDA under an Emergency Use Authorization (EUA). This EUA will remain in effect (meaning this test can be used) for the duration of the COVID-19 declaration under Section 564(b)(1) of the Act, 21 U.S.C. section 360bbb-3(b)(1), unless the authorization is terminated or revoked.  Performed at [facility], [place] [address], [place], [place] [address]      Labs: BNP (last 3 results) Recent Labs    03/11/21 0033 03/12/21 0048  BNP 24.2 21.3   Basic Metabolic Panel: Recent Labs  Lab 03/08/21 1930 03/08/21 2003 03/09/21 0330 03/10/21 0116 03/11/21 0033 03/12/21 0048  NA 136 136 134* 131* 128* 134*  K 4.2 4.1 3.6 3.8 3.6 3.7  CL 102 100 102 101 96* 103  CO2 24  --  25 23 22 23   GLUCOSE 108* 107* 129* 130* 163* 119*  BUN 10 9 6* 13 18 16   CREATININE 0.78 0.70 0.65 0.76 0.75 0.91  CALCIUM 8.8*  --  8.4* 8.7* 8.7* 9.0  MG  --   --   --   --  2.1 2.3   Liver Function Tests: Recent Labs  Lab 03/08/21 1930 03/11/21 0033 03/12/21 0048  AST 18 19 19   ALT 11 12 13   ALKPHOS 71 76 87  BILITOT 0.4 0.5 0.6  PROT 6.4* 6.3* 6.3*  ALBUMIN 3.7 3.5 3.3*   No results for input(s): LIPASE, AMYLASE in the last 168 hours. No results for input(s): AMMONIA in the last 168 hours. CBC: Recent Labs  Lab 03/08/21 1930 03/08/21 2003 03/09/21 0330 03/10/21 0116 03/11/21 0033 03/12/21 0048  WBC 8.9  --  10.4 10.3 11.8* 13.0*  NEUTROABS 5.2  --   --   --  9.1* 10.3*   HGB 11.2* 13.3 10.6* 10.8* 11.3* 11.3*  HCT 36.7* 39.0 34.9* 34.9* 35.7* 36.7*  MCV 87.8  --  88.1 86.4 85.0 85.7  PLT 491*  --  467* 506* 539*  494*   Cardiac Enzymes: No results for input(s): CKTOTAL, CKMB, CKMBINDEX, TROPONINI in the last 168 hours. BNP: Invalid input(s): POCBNP CBG: No results for input(s): GLUCAP in the last 168 hours. D-Dimer No results for input(s): DDIMER in the last 72 hours. Hgb A1c No results for input(s): HGBA1C in the last 72 hours. Lipid Profile No results for input(s): CHOL, HDL, LDLCALC, TRIG, CHOLHDL, LDLDIRECT in the last 72 hours. Thyroid function studies No results for input(s): TSH, T4TOTAL, T3FREE, THYROIDAB in the last 72 hours.  Invalid input(s): FREET3 Anemia work up No results for input(s): VITAMINB12, FOLATE, FERRITIN, TIBC, IRON, RETICCTPCT in the last 72 hours. Urinalysis    Component Value Date/Time   COLORURINE STRAW (A) 03/08/2021 1919   APPEARANCEUR CLEAR 03/08/2021 1919   LABSPEC 1.006 03/08/2021 1919   PHURINE 6.0 03/08/2021 1919   GLUCOSEU NEGATIVE 03/08/2021 1919   HGBUR NEGATIVE 03/08/2021 [place] NEGATIVE 03/08/2021 [place] NEGATIVE 03/08/2021 1919   PROTEINUR NEGATIVE 03/08/2021 1919   UROBILINOGEN 0.2 12/22/2013 1210   NITRITE NEGATIVE 03/08/2021 1919   LEUKOCYTESUR NEGATIVE 03/08/2021 1919   Sepsis Labs Invalid input(s): PROCALCITONIN,  WBC,  LACTICIDVEN Microbiology Recent Results (from the past 240 hour(s))  Resp Panel by RT-PCR (Flu A&B, Covid) Nasopharyngeal Swab     Status: None   Collection Time: 03/08/21  7:30 PM   Specimen: Nasopharyngeal Swab; Nasopharyngeal(NP) swabs in vial transport medium  Result Value Ref Range Status   SARS Coronavirus 2 by RT PCR NEGATIVE NEGATIVE Final    Comment: (NOTE) SARS-CoV-2 target nucleic acids are NOT DETECTED.  The SARS-CoV-2 RNA is generally detectable in upper respiratory specimens during the acute phase of infection. The  lowest concentration of SARS-CoV-2 viral copies this assay can detect is 138 copies/mL. A negative result does not preclude SARS-Cov-2 infection and should not be used as the sole basis for treatment or other patient management decisions. A negative result may occur with  improper specimen collection/handling, submission of specimen other than nasopharyngeal swab, presence of viral mutation(s) within the areas targeted by this assay, and inadequate number of viral copies(<138 copies/mL). A negative result must be combined with clinical observations, patient history, and epidemiological information. The expected result is Negative.  Fact Sheet for Patients:  EntrepreneurPulse.com.au  Fact Sheet for Healthcare Providers:  IncredibleEmployment.be  This test is no t yet approved or cleared by the [place] FDA and  has been authorized for detection and/or diagnosis of SARS-CoV-2 by FDA under an Emergency Use Authorization (EUA). This EUA will remain  in effect (meaning this test can be used) for the duration of the COVID-19 declaration under Section 564(b)(1) of the Act, 21 U.S.C.section 360bbb-3(b)(1), unless the authorization is terminated  or revoked sooner.       Influenza A by PCR NEGATIVE NEGATIVE Final   Influenza B by PCR NEGATIVE NEGATIVE Final    Comment: (NOTE) The Xpert Xpress SARS-CoV-2/FLU/RSV plus assay is intended as an aid in the diagnosis of influenza from Nasopharyngeal swab specimens and should not be used as a sole basis for treatment. Nasal washings and aspirates are unacceptable for Xpert Xpress SARS-CoV-2/FLU/RSV testing.  Fact Sheet for Patients: EntrepreneurPulse.com.au  Fact Sheet for Healthcare Providers: IncredibleEmployment.be  This test is not yet approved or cleared by the [place] FDA and has been authorized for detection and/or diagnosis of SARS-CoV-2 by FDA under  an Emergency Use Authorization (EUA). This EUA will remain in effect (meaning this test can be used) for the  duration of the COVID-19 declaration under Section 564(b)(1) of the Act, 21 U.S.C. section 360bbb-3(b)(1), unless the authorization is terminated or revoked.  Performed at [facility], [place] [address] [place], [place] [address]   Resp Panel by RT-PCR (Flu A&B, Covid) Nasopharyngeal Swab     Status: None   Collection Time: 03/11/21 11:23 AM   Specimen: Nasopharyngeal Swab; Nasopharyngeal(NP) swabs in vial transport medium  Result Value Ref Range Status   SARS Coronavirus 2 by RT PCR NEGATIVE NEGATIVE Final    Comment: (NOTE) SARS-CoV-2 target nucleic acids are NOT DETECTED.  The SARS-CoV-2 RNA is generally detectable in upper respiratory specimens during the acute phase of infection. The lowest concentration of SARS-CoV-2 viral copies this assay can detect is 138 copies/mL. A negative result does not preclude SARS-Cov-2 infection and should not be used as the sole basis for treatment or other patient management decisions. A negative result may occur with  improper specimen collection/handling, submission of specimen other than nasopharyngeal swab, presence of viral mutation(s) within the areas targeted by this assay, and inadequate number of viral copies(<138 copies/mL). A negative result must be combined with clinical observations, patient history, and epidemiological information. The expected result is Negative.  Fact Sheet for Patients:  EntrepreneurPulse.com.au  Fact Sheet for Healthcare Providers:  IncredibleEmployment.be  This test is no t yet approved or cleared by the [place] FDA and  has been authorized for detection and/or diagnosis of SARS-CoV-2 by FDA under an Emergency Use Authorization (EUA). This EUA will remain  in effect (meaning this test can be used) for the duration of the COVID-19 declaration under  Section 564(b)(1) of the Act, 21 U.S.C.section 360bbb-3(b)(1), unless the authorization is terminated  or revoked sooner.       Influenza A by PCR NEGATIVE NEGATIVE Final   Influenza B by PCR NEGATIVE NEGATIVE Final    Comment: (NOTE) The Xpert Xpress SARS-CoV-2/FLU/RSV plus assay is intended as an aid in the diagnosis of influenza from Nasopharyngeal swab specimens and should not be used as a sole basis for treatment. Nasal washings and aspirates are unacceptable for Xpert Xpress SARS-CoV-2/FLU/RSV testing.  Fact Sheet for Patients: EntrepreneurPulse.com.au  Fact Sheet for Healthcare Providers: IncredibleEmployment.be  This test is not yet approved or cleared by the [place] FDA and has been authorized for detection and/or diagnosis of SARS-CoV-2 by FDA under an Emergency Use Authorization (EUA). This EUA will remain in effect (meaning this test can be used) for the duration of the COVID-19 declaration under Section 564(b)(1) of the Act, 21 U.S.C. section 360bbb-3(b)(1), unless the authorization is terminated or revoked.  Performed at [facility], [place] [address] [place],  [address]      Time coordinating discharge: Over 30 minutes  SIGNED:   Phillips Climes, MD  [facility] 03/12/2021, 11:40 AM Pager   If 7PM-7AM, please contact night-coverage [email or web address] Password [email or web address]

## 2021-03-12 NOTE — Progress Notes (Signed)
Report called to Mendel Ryder, receiving nurse, at [facility]. All questions answered. PIV removed. Patient waiting for arrival of [facility].

## 2021-03-12 NOTE — TOC Transition Note (Signed)
Transition of Care [facility]) - CM/SW Discharge Note   Patient Details  Name: [name] MRN: [id number] Date of Birth: 1931/09/12  [facility] CM/SW Contact:  Benard Halsted, LCSW Phone Number: 03/12/2021, 12:05 PM   Clinical Narrative:    Patient will DC to: [facility] Anticipated DC date: 03/12/21 Family notified: Spouse at bedside Transport by: Corey Harold    Per MD patient ready for DC to [facility]. RN to call report prior to discharge [phone] PB-2, room 104). RN, patient, patient's family, and facility notified of DC. Discharge Summary and FL2 sent to facility. DC packet on chart. Ambulance transport requested for patient.   CSW will sign off for now as social work intervention is no longer needed. Please consult [place] again if new needs arise.     Final next level of care: Skilled Nursing Facility Barriers to Discharge: Barriers Resolved   Patient Goals and CMS Choice Patient states their goals for this hospitalization and ongoing recovery are:: Rehab CMS Medicare.gov Compare Post Acute Care list provided to:: Patient Represenative (must comment) Choice offered to / list presented to : Spouse  Discharge Placement   Existing PASRR number confirmed : 03/12/21          Patient chooses bed at: [facility] Patient to be transferred to facility by: [place] Name of family member notified: Spouse Patient and family notified of of transfer: 03/12/21  Discharge Plan and Services In-house Referral: Clinical Social Work                                   Social Determinants of Health ([place]) Interventions     Readmission Risk Interventions No flowsheet data found.

## 2021-03-12 NOTE — Progress Notes (Signed)
[facility] has been called three times with no response to give report. Will try to call back at a later time.

## 2021-03-13 DIAGNOSIS — W19XXXD Unspecified fall, subsequent encounter: Secondary | ICD-10-CM | POA: Diagnosis not present

## 2021-03-13 DIAGNOSIS — I679 Cerebrovascular disease, unspecified: Secondary | ICD-10-CM | POA: Diagnosis not present

## 2021-03-13 DIAGNOSIS — J4520 Mild intermittent asthma, uncomplicated: Secondary | ICD-10-CM | POA: Diagnosis not present

## 2021-03-13 DIAGNOSIS — I5042 Chronic combined systolic (congestive) and diastolic (congestive) heart failure: Secondary | ICD-10-CM | POA: Diagnosis not present

## 2021-03-13 DIAGNOSIS — S22089D Unspecified fracture of T11-T12 vertebra, subsequent encounter for fracture with routine healing: Secondary | ICD-10-CM | POA: Diagnosis not present

## 2021-03-13 DIAGNOSIS — I110 Hypertensive heart disease with heart failure: Secondary | ICD-10-CM | POA: Diagnosis not present

## 2021-03-13 DIAGNOSIS — R2689 Other abnormalities of gait and mobility: Secondary | ICD-10-CM | POA: Diagnosis not present

## 2021-03-21 ENCOUNTER — Ambulatory Visit (HOSPITAL_COMMUNITY): Payer: Medicare Other

## 2021-03-29 DIAGNOSIS — M6281 Muscle weakness (generalized): Secondary | ICD-10-CM | POA: Diagnosis not present

## 2021-03-29 DIAGNOSIS — R1312 Dysphagia, oropharyngeal phase: Secondary | ICD-10-CM | POA: Diagnosis not present

## 2021-03-29 DIAGNOSIS — S22080D Wedge compression fracture of T11-T12 vertebra, subsequent encounter for fracture with routine healing: Secondary | ICD-10-CM | POA: Diagnosis not present

## 2021-03-29 DIAGNOSIS — R471 Dysarthria and anarthria: Secondary | ICD-10-CM | POA: Diagnosis not present

## 2021-03-29 DIAGNOSIS — K219 Gastro-esophageal reflux disease without esophagitis: Secondary | ICD-10-CM | POA: Diagnosis not present

## 2021-03-29 DIAGNOSIS — R262 Difficulty in walking, not elsewhere classified: Secondary | ICD-10-CM | POA: Diagnosis not present

## 2021-03-29 DIAGNOSIS — Z8673 Personal history of transient ischemic attack (TIA), and cerebral infarction without residual deficits: Secondary | ICD-10-CM | POA: Diagnosis not present

## 2021-03-29 DIAGNOSIS — R4701 Aphasia: Secondary | ICD-10-CM | POA: Diagnosis not present

## 2021-03-31 DIAGNOSIS — S22080D Wedge compression fracture of T11-T12 vertebra, subsequent encounter for fracture with routine healing: Secondary | ICD-10-CM | POA: Diagnosis not present

## 2021-03-31 DIAGNOSIS — R262 Difficulty in walking, not elsewhere classified: Secondary | ICD-10-CM | POA: Diagnosis not present

## 2021-03-31 DIAGNOSIS — R4701 Aphasia: Secondary | ICD-10-CM | POA: Diagnosis not present

## 2021-03-31 DIAGNOSIS — K219 Gastro-esophageal reflux disease without esophagitis: Secondary | ICD-10-CM | POA: Diagnosis not present

## 2021-03-31 DIAGNOSIS — R1312 Dysphagia, oropharyngeal phase: Secondary | ICD-10-CM | POA: Diagnosis not present

## 2021-03-31 DIAGNOSIS — M6281 Muscle weakness (generalized): Secondary | ICD-10-CM | POA: Diagnosis not present

## 2021-03-31 DIAGNOSIS — Z8673 Personal history of transient ischemic attack (TIA), and cerebral infarction without residual deficits: Secondary | ICD-10-CM | POA: Diagnosis not present

## 2021-03-31 DIAGNOSIS — R471 Dysarthria and anarthria: Secondary | ICD-10-CM | POA: Diagnosis not present

## 2021-04-01 DIAGNOSIS — K219 Gastro-esophageal reflux disease without esophagitis: Secondary | ICD-10-CM | POA: Diagnosis not present

## 2021-04-01 DIAGNOSIS — R4701 Aphasia: Secondary | ICD-10-CM | POA: Diagnosis not present

## 2021-04-01 DIAGNOSIS — S22080D Wedge compression fracture of T11-T12 vertebra, subsequent encounter for fracture with routine healing: Secondary | ICD-10-CM | POA: Diagnosis not present

## 2021-04-01 DIAGNOSIS — R262 Difficulty in walking, not elsewhere classified: Secondary | ICD-10-CM | POA: Diagnosis not present

## 2021-04-01 DIAGNOSIS — R471 Dysarthria and anarthria: Secondary | ICD-10-CM | POA: Diagnosis not present

## 2021-04-01 DIAGNOSIS — M6281 Muscle weakness (generalized): Secondary | ICD-10-CM | POA: Diagnosis not present

## 2021-04-01 DIAGNOSIS — Z8673 Personal history of transient ischemic attack (TIA), and cerebral infarction without residual deficits: Secondary | ICD-10-CM | POA: Diagnosis not present

## 2021-04-01 DIAGNOSIS — R1312 Dysphagia, oropharyngeal phase: Secondary | ICD-10-CM | POA: Diagnosis not present

## 2021-04-02 DIAGNOSIS — R471 Dysarthria and anarthria: Secondary | ICD-10-CM | POA: Diagnosis not present

## 2021-04-02 DIAGNOSIS — Z8673 Personal history of transient ischemic attack (TIA), and cerebral infarction without residual deficits: Secondary | ICD-10-CM | POA: Diagnosis not present

## 2021-04-02 DIAGNOSIS — S22080D Wedge compression fracture of T11-T12 vertebra, subsequent encounter for fracture with routine healing: Secondary | ICD-10-CM | POA: Diagnosis not present

## 2021-04-02 DIAGNOSIS — K219 Gastro-esophageal reflux disease without esophagitis: Secondary | ICD-10-CM | POA: Diagnosis not present

## 2021-04-02 DIAGNOSIS — R1312 Dysphagia, oropharyngeal phase: Secondary | ICD-10-CM | POA: Diagnosis not present

## 2021-04-02 DIAGNOSIS — R4701 Aphasia: Secondary | ICD-10-CM | POA: Diagnosis not present

## 2021-04-02 DIAGNOSIS — R262 Difficulty in walking, not elsewhere classified: Secondary | ICD-10-CM | POA: Diagnosis not present

## 2021-04-02 DIAGNOSIS — M6281 Muscle weakness (generalized): Secondary | ICD-10-CM | POA: Diagnosis not present

## 2021-04-03 DIAGNOSIS — R1312 Dysphagia, oropharyngeal phase: Secondary | ICD-10-CM | POA: Diagnosis not present

## 2021-04-03 DIAGNOSIS — K219 Gastro-esophageal reflux disease without esophagitis: Secondary | ICD-10-CM | POA: Diagnosis not present

## 2021-04-03 DIAGNOSIS — R4701 Aphasia: Secondary | ICD-10-CM | POA: Diagnosis not present

## 2021-04-03 DIAGNOSIS — R471 Dysarthria and anarthria: Secondary | ICD-10-CM | POA: Diagnosis not present

## 2021-04-03 DIAGNOSIS — R262 Difficulty in walking, not elsewhere classified: Secondary | ICD-10-CM | POA: Diagnosis not present

## 2021-04-03 DIAGNOSIS — M6281 Muscle weakness (generalized): Secondary | ICD-10-CM | POA: Diagnosis not present

## 2021-04-03 DIAGNOSIS — S22080D Wedge compression fracture of T11-T12 vertebra, subsequent encounter for fracture with routine healing: Secondary | ICD-10-CM | POA: Diagnosis not present

## 2021-04-03 DIAGNOSIS — Z8673 Personal history of transient ischemic attack (TIA), and cerebral infarction without residual deficits: Secondary | ICD-10-CM | POA: Diagnosis not present

## 2021-04-04 DIAGNOSIS — R471 Dysarthria and anarthria: Secondary | ICD-10-CM | POA: Diagnosis not present

## 2021-04-04 DIAGNOSIS — M6281 Muscle weakness (generalized): Secondary | ICD-10-CM | POA: Diagnosis not present

## 2021-04-04 DIAGNOSIS — R4701 Aphasia: Secondary | ICD-10-CM | POA: Diagnosis not present

## 2021-04-04 DIAGNOSIS — R262 Difficulty in walking, not elsewhere classified: Secondary | ICD-10-CM | POA: Diagnosis not present

## 2021-04-04 DIAGNOSIS — K219 Gastro-esophageal reflux disease without esophagitis: Secondary | ICD-10-CM | POA: Diagnosis not present

## 2021-04-04 DIAGNOSIS — S22080D Wedge compression fracture of T11-T12 vertebra, subsequent encounter for fracture with routine healing: Secondary | ICD-10-CM | POA: Diagnosis not present

## 2021-04-04 DIAGNOSIS — Z8673 Personal history of transient ischemic attack (TIA), and cerebral infarction without residual deficits: Secondary | ICD-10-CM | POA: Diagnosis not present

## 2021-04-04 DIAGNOSIS — R1312 Dysphagia, oropharyngeal phase: Secondary | ICD-10-CM | POA: Diagnosis not present

## 2021-04-05 DIAGNOSIS — S22080D Wedge compression fracture of T11-T12 vertebra, subsequent encounter for fracture with routine healing: Secondary | ICD-10-CM | POA: Diagnosis not present

## 2021-04-05 DIAGNOSIS — R262 Difficulty in walking, not elsewhere classified: Secondary | ICD-10-CM | POA: Diagnosis not present

## 2021-04-05 DIAGNOSIS — M6281 Muscle weakness (generalized): Secondary | ICD-10-CM | POA: Diagnosis not present

## 2021-04-05 DIAGNOSIS — R1312 Dysphagia, oropharyngeal phase: Secondary | ICD-10-CM | POA: Diagnosis not present

## 2021-04-05 DIAGNOSIS — R4701 Aphasia: Secondary | ICD-10-CM | POA: Diagnosis not present

## 2021-04-05 DIAGNOSIS — K219 Gastro-esophageal reflux disease without esophagitis: Secondary | ICD-10-CM | POA: Diagnosis not present

## 2021-04-05 DIAGNOSIS — Z8673 Personal history of transient ischemic attack (TIA), and cerebral infarction without residual deficits: Secondary | ICD-10-CM | POA: Diagnosis not present

## 2021-04-05 DIAGNOSIS — R471 Dysarthria and anarthria: Secondary | ICD-10-CM | POA: Diagnosis not present

## 2021-04-07 DIAGNOSIS — R262 Difficulty in walking, not elsewhere classified: Secondary | ICD-10-CM | POA: Diagnosis not present

## 2021-04-07 DIAGNOSIS — S22080D Wedge compression fracture of T11-T12 vertebra, subsequent encounter for fracture with routine healing: Secondary | ICD-10-CM | POA: Diagnosis not present

## 2021-04-07 DIAGNOSIS — M6281 Muscle weakness (generalized): Secondary | ICD-10-CM | POA: Diagnosis not present

## 2021-04-07 DIAGNOSIS — Z8673 Personal history of transient ischemic attack (TIA), and cerebral infarction without residual deficits: Secondary | ICD-10-CM | POA: Diagnosis not present

## 2021-04-07 DIAGNOSIS — K219 Gastro-esophageal reflux disease without esophagitis: Secondary | ICD-10-CM | POA: Diagnosis not present

## 2021-04-07 DIAGNOSIS — R471 Dysarthria and anarthria: Secondary | ICD-10-CM | POA: Diagnosis not present

## 2021-04-07 DIAGNOSIS — R4701 Aphasia: Secondary | ICD-10-CM | POA: Diagnosis not present

## 2021-04-07 DIAGNOSIS — R1312 Dysphagia, oropharyngeal phase: Secondary | ICD-10-CM | POA: Diagnosis not present

## 2021-04-08 DIAGNOSIS — R262 Difficulty in walking, not elsewhere classified: Secondary | ICD-10-CM | POA: Diagnosis not present

## 2021-04-08 DIAGNOSIS — R4701 Aphasia: Secondary | ICD-10-CM | POA: Diagnosis not present

## 2021-04-08 DIAGNOSIS — R1312 Dysphagia, oropharyngeal phase: Secondary | ICD-10-CM | POA: Diagnosis not present

## 2021-04-08 DIAGNOSIS — K219 Gastro-esophageal reflux disease without esophagitis: Secondary | ICD-10-CM | POA: Diagnosis not present

## 2021-04-08 DIAGNOSIS — M6281 Muscle weakness (generalized): Secondary | ICD-10-CM | POA: Diagnosis not present

## 2021-04-08 DIAGNOSIS — R471 Dysarthria and anarthria: Secondary | ICD-10-CM | POA: Diagnosis not present

## 2021-04-08 DIAGNOSIS — S22080D Wedge compression fracture of T11-T12 vertebra, subsequent encounter for fracture with routine healing: Secondary | ICD-10-CM | POA: Diagnosis not present

## 2021-04-08 DIAGNOSIS — Z8673 Personal history of transient ischemic attack (TIA), and cerebral infarction without residual deficits: Secondary | ICD-10-CM | POA: Diagnosis not present

## 2021-04-10 DIAGNOSIS — R262 Difficulty in walking, not elsewhere classified: Secondary | ICD-10-CM | POA: Diagnosis not present

## 2021-04-10 DIAGNOSIS — M6281 Muscle weakness (generalized): Secondary | ICD-10-CM | POA: Diagnosis not present

## 2021-04-10 DIAGNOSIS — I480 Paroxysmal atrial fibrillation: Secondary | ICD-10-CM | POA: Diagnosis not present

## 2021-04-10 DIAGNOSIS — S22080D Wedge compression fracture of T11-T12 vertebra, subsequent encounter for fracture with routine healing: Secondary | ICD-10-CM | POA: Diagnosis not present

## 2021-04-10 DIAGNOSIS — R278 Other lack of coordination: Secondary | ICD-10-CM | POA: Diagnosis not present

## 2021-04-10 DIAGNOSIS — I5042 Chronic combined systolic (congestive) and diastolic (congestive) heart failure: Secondary | ICD-10-CM | POA: Diagnosis not present

## 2021-04-11 DIAGNOSIS — R278 Other lack of coordination: Secondary | ICD-10-CM | POA: Diagnosis not present

## 2021-04-11 DIAGNOSIS — S22080D Wedge compression fracture of T11-T12 vertebra, subsequent encounter for fracture with routine healing: Secondary | ICD-10-CM | POA: Diagnosis not present

## 2021-04-11 DIAGNOSIS — M6281 Muscle weakness (generalized): Secondary | ICD-10-CM | POA: Diagnosis not present

## 2021-04-11 DIAGNOSIS — R4701 Aphasia: Secondary | ICD-10-CM | POA: Diagnosis not present

## 2021-04-11 DIAGNOSIS — R262 Difficulty in walking, not elsewhere classified: Secondary | ICD-10-CM | POA: Diagnosis not present

## 2021-04-15 DIAGNOSIS — R278 Other lack of coordination: Secondary | ICD-10-CM | POA: Diagnosis not present

## 2021-04-15 DIAGNOSIS — I5042 Chronic combined systolic (congestive) and diastolic (congestive) heart failure: Secondary | ICD-10-CM | POA: Diagnosis not present

## 2021-04-15 DIAGNOSIS — M6281 Muscle weakness (generalized): Secondary | ICD-10-CM | POA: Diagnosis not present

## 2021-04-15 DIAGNOSIS — I480 Paroxysmal atrial fibrillation: Secondary | ICD-10-CM | POA: Diagnosis not present

## 2021-04-15 DIAGNOSIS — S22080D Wedge compression fracture of T11-T12 vertebra, subsequent encounter for fracture with routine healing: Secondary | ICD-10-CM | POA: Diagnosis not present

## 2021-04-15 DIAGNOSIS — R262 Difficulty in walking, not elsewhere classified: Secondary | ICD-10-CM | POA: Diagnosis not present

## 2021-04-17 DIAGNOSIS — S22080D Wedge compression fracture of T11-T12 vertebra, subsequent encounter for fracture with routine healing: Secondary | ICD-10-CM | POA: Diagnosis not present

## 2021-04-17 DIAGNOSIS — R4701 Aphasia: Secondary | ICD-10-CM | POA: Diagnosis not present

## 2021-04-17 DIAGNOSIS — R262 Difficulty in walking, not elsewhere classified: Secondary | ICD-10-CM | POA: Diagnosis not present

## 2021-04-17 DIAGNOSIS — M6281 Muscle weakness (generalized): Secondary | ICD-10-CM | POA: Diagnosis not present

## 2021-04-17 DIAGNOSIS — R278 Other lack of coordination: Secondary | ICD-10-CM | POA: Diagnosis not present

## 2021-04-18 DIAGNOSIS — I480 Paroxysmal atrial fibrillation: Secondary | ICD-10-CM | POA: Diagnosis not present

## 2021-04-18 DIAGNOSIS — I5042 Chronic combined systolic (congestive) and diastolic (congestive) heart failure: Secondary | ICD-10-CM | POA: Diagnosis not present

## 2021-04-18 DIAGNOSIS — R262 Difficulty in walking, not elsewhere classified: Secondary | ICD-10-CM | POA: Diagnosis not present

## 2021-04-18 DIAGNOSIS — M6281 Muscle weakness (generalized): Secondary | ICD-10-CM | POA: Diagnosis not present

## 2021-04-18 DIAGNOSIS — S22080D Wedge compression fracture of T11-T12 vertebra, subsequent encounter for fracture with routine healing: Secondary | ICD-10-CM | POA: Diagnosis not present

## 2021-04-18 DIAGNOSIS — R278 Other lack of coordination: Secondary | ICD-10-CM | POA: Diagnosis not present

## 2021-04-21 DIAGNOSIS — I5042 Chronic combined systolic (congestive) and diastolic (congestive) heart failure: Secondary | ICD-10-CM | POA: Diagnosis not present

## 2021-04-21 DIAGNOSIS — M6281 Muscle weakness (generalized): Secondary | ICD-10-CM | POA: Diagnosis not present

## 2021-04-21 DIAGNOSIS — I480 Paroxysmal atrial fibrillation: Secondary | ICD-10-CM | POA: Diagnosis not present

## 2021-04-21 DIAGNOSIS — S22080D Wedge compression fracture of T11-T12 vertebra, subsequent encounter for fracture with routine healing: Secondary | ICD-10-CM | POA: Diagnosis not present

## 2021-04-22 DIAGNOSIS — G459 Transient cerebral ischemic attack, unspecified: Secondary | ICD-10-CM | POA: Diagnosis not present

## 2021-04-22 DIAGNOSIS — I10 Essential (primary) hypertension: Secondary | ICD-10-CM | POA: Diagnosis not present

## 2021-04-22 DIAGNOSIS — R410 Disorientation, unspecified: Secondary | ICD-10-CM | POA: Diagnosis not present

## 2021-04-22 DIAGNOSIS — J4540 Moderate persistent asthma, uncomplicated: Secondary | ICD-10-CM | POA: Diagnosis not present

## 2021-04-22 DIAGNOSIS — E1169 Type 2 diabetes mellitus with other specified complication: Secondary | ICD-10-CM | POA: Diagnosis not present

## 2021-04-22 DIAGNOSIS — E7800 Pure hypercholesterolemia, unspecified: Secondary | ICD-10-CM | POA: Diagnosis not present

## 2021-04-24 DIAGNOSIS — S22080D Wedge compression fracture of T11-T12 vertebra, subsequent encounter for fracture with routine healing: Secondary | ICD-10-CM | POA: Diagnosis not present

## 2021-04-24 DIAGNOSIS — I480 Paroxysmal atrial fibrillation: Secondary | ICD-10-CM | POA: Diagnosis not present

## 2021-04-24 DIAGNOSIS — I5042 Chronic combined systolic (congestive) and diastolic (congestive) heart failure: Secondary | ICD-10-CM | POA: Diagnosis not present

## 2021-04-24 DIAGNOSIS — M6281 Muscle weakness (generalized): Secondary | ICD-10-CM | POA: Diagnosis not present

## 2021-04-28 DIAGNOSIS — M170 Bilateral primary osteoarthritis of knee: Secondary | ICD-10-CM | POA: Diagnosis not present

## 2021-04-29 DIAGNOSIS — M6281 Muscle weakness (generalized): Secondary | ICD-10-CM | POA: Diagnosis not present

## 2021-04-29 DIAGNOSIS — I480 Paroxysmal atrial fibrillation: Secondary | ICD-10-CM | POA: Diagnosis not present

## 2021-04-29 DIAGNOSIS — S22080D Wedge compression fracture of T11-T12 vertebra, subsequent encounter for fracture with routine healing: Secondary | ICD-10-CM | POA: Diagnosis not present

## 2021-04-29 DIAGNOSIS — I5042 Chronic combined systolic (congestive) and diastolic (congestive) heart failure: Secondary | ICD-10-CM | POA: Diagnosis not present

## 2021-05-01 DIAGNOSIS — I480 Paroxysmal atrial fibrillation: Secondary | ICD-10-CM | POA: Diagnosis not present

## 2021-05-01 DIAGNOSIS — I5042 Chronic combined systolic (congestive) and diastolic (congestive) heart failure: Secondary | ICD-10-CM | POA: Diagnosis not present

## 2021-05-01 DIAGNOSIS — S22080D Wedge compression fracture of T11-T12 vertebra, subsequent encounter for fracture with routine healing: Secondary | ICD-10-CM | POA: Diagnosis not present

## 2021-05-01 DIAGNOSIS — M6281 Muscle weakness (generalized): Secondary | ICD-10-CM | POA: Diagnosis not present

## 2021-05-06 ENCOUNTER — Inpatient Hospital Stay: Payer: Medicare Other | Admitting: Adult Health

## 2021-05-07 DIAGNOSIS — R278 Other lack of coordination: Secondary | ICD-10-CM | POA: Diagnosis not present

## 2021-05-07 DIAGNOSIS — S22080D Wedge compression fracture of T11-T12 vertebra, subsequent encounter for fracture with routine healing: Secondary | ICD-10-CM | POA: Diagnosis not present

## 2021-05-07 DIAGNOSIS — M6281 Muscle weakness (generalized): Secondary | ICD-10-CM | POA: Diagnosis not present

## 2021-05-07 DIAGNOSIS — R262 Difficulty in walking, not elsewhere classified: Secondary | ICD-10-CM | POA: Diagnosis not present

## 2021-05-07 DIAGNOSIS — I480 Paroxysmal atrial fibrillation: Secondary | ICD-10-CM | POA: Diagnosis not present

## 2021-05-07 DIAGNOSIS — I5042 Chronic combined systolic (congestive) and diastolic (congestive) heart failure: Secondary | ICD-10-CM | POA: Diagnosis not present

## 2021-05-12 ENCOUNTER — Encounter: Payer: Self-pay | Admitting: Adult Health

## 2021-05-12 ENCOUNTER — Ambulatory Visit: Payer: Medicare Other | Admitting: Adult Health

## 2021-05-12 VITALS — BP 138/72 | HR 71

## 2021-05-12 DIAGNOSIS — Z5181 Encounter for therapeutic drug level monitoring: Secondary | ICD-10-CM

## 2021-05-12 DIAGNOSIS — R479 Unspecified speech disturbances: Secondary | ICD-10-CM | POA: Diagnosis not present

## 2021-05-12 DIAGNOSIS — G2581 Restless legs syndrome: Secondary | ICD-10-CM

## 2021-05-12 DIAGNOSIS — Z8673 Personal history of transient ischemic attack (TIA), and cerebral infarction without residual deficits: Secondary | ICD-10-CM

## 2021-05-12 MED ORDER — ROPINIROLE HCL 4 MG PO TABS: 4.0000 mg | ORAL_TABLET | Freq: Every day | ORAL | 5 refills | Status: DC

## 2021-05-12 NOTE — Patient Instructions (Addendum)
Continue Depakote for likely recurrent seizure events - please call without any reoccurring episodes for possible need of dosage adjustment ? ?Continue aspirin and simvastatin for secondary stroke prevention measures. ? ?Continue close PCP follow-up for aggressive stroke risk factor management ? ?Increase Requip to 4 mg nightly - please call with any difficulty tolerating  ? ? ? ? ?Follow-up in 4 months or call earlier if needed ?

## 2021-05-12 NOTE — Progress Notes (Signed)
? ?[facility] ? ?PATIENT: [name] ?DOB: [birth year] ? ? ?REASON FOR VISIT: hx of multiple strokes/TIAs ?HISTORY FROM: Patient and wife ? ? ?Chief Complaint  ?Patient presents with  ? Follow-up  ?  RM 2 With spouse [name]  ?Pt is well and stable, no new concerns   ?  ? ? ?HISTORY OF PRESENT ILLNESS: ? ?Update 05/12/2021 JM: patient returns for recent hospital follow-up accompanied by his wife.  Presented to ED on 03/08/2021 for 15-minute episode of incomprehensible speech.  Evaluated by Dr. Erlinda [name] who felt symptoms possibly in setting of TIA vs seizure vs complex migraine.  MRI negative.  CTA head/neck negative LVO.  EF 55 to 60%.  EEG moderate diffuse slowing indicative of global cerebral dysfunction, no epileptiform abnormalities noted.  Recommended aspirin 81 mg daily and initiated Depakote for possible seizure vs complicated migraine prophylaxis. Remained in back brace for recent compression fractures and PT/OT recommended SNF.  ? ?Since discharge, patient has been doing well. He has since returned back home - stayed in rehab for 3 weeks.  No reoccurring speech or language difficulty since discharge. Cognition has greatly improved and currently at baseline. He completed home health therapy last week - continues to do HEP. Of note, at prior visit, he was started on zonisamide for concern of seizure activity with these recurrent speech impairment episodes but apparently he stopped as medication as it made him too sleepy. He reports compliance on Depakote 500 mg nightly ('125mg'$  4 caps nightly due to difficulty swallowing tablet) which he has been tolerating without side effects. He also notes continued issues with RLS - currently takes Requip 3 mg nightly - asks if this can be further increased.  Compliant on aspirin and simvastatin, denies side effects.  Blood pressure today 138/72.  No further concerns at this time. ? ? ? ? ?History provided for reference purposes only ?Update 12/09/2020 JM:  Returns for routine stroke follow-up. Called office 05/28/2020 for concern of TIA on 05/25/2020 with speech disturbance that resolved after approximately 20 minutes.  He did not seek emergency evaluation. Reports additional episode in June - per wife, speech impairment more severe than prior episodes (eval by EMS but returned to baseline quickly - did not proceed to ED).  Reports mental fatigue after episodes which usually lasts the rest of th day. Concern of events possibly occurring while sleeping as he may wake up feeling "fuzzy" in the morning - more recently happening in September.  Similar episode 04/06/2020 (see below) and recommended trialing increased aspirin dose '81mg'$  to 325 mg daily (if cleared by GI) but had recurrent GI bleeds on full dose aspirin (08/2020) as well as hx of recurrent bleeds on Plavix.  He has remained on aspirin 81 mg daily without any reoccurring GI bleeds as well as simvastatin 40 mg daily without side effects.  Blood pressure today 128/78.  Routinely follows with PCP for aggressive stroke risk factor management.  Remains on Requip 3 mg nightly for longstanding RLS with occasional worsening symptoms 2-3 nights per month. Wife concerned regarding gradual decline of ambulation due to chronic generalized pain -routinely followed by orthopedics receiving cortisone injections every 3 months.  No further concerns at this time. ? ? ? ?Update 04/10/2020 JM: [name] returns for acute visit accompanied by his wife with concern of possible recurrent TIA. ? ?Reports on 04/06/2020 he had 20 to 30 minute episode of slurred speech and slight word finding difficulty ?Denies weakness, facial droop, visual changes, confusion or  AMS, numbness/tingling, or headache ?Evaluated by EMS - slightly elevated blood pressure but otherwise EKG and other vitals normal ?No symptoms prior to onset or after event such as fatigue or confusion ? ?He has had similar episodes previously in 2010, 2015, 2017 and 2020 dx with TIA  vs seizure type episode ?He denies personal prior diagnosis or family history of migraines or confirmed seizures ? ?He is currently only on aspirin 81 mg daily as he had continued recurrent GI bleeds with acute blood loss anemia while on Plavix most recently in 12/2019.  Plavix was discontinued at that time. Continues to follow with GI personally reviewed OV note and they felt chronic Plavix use possibly contributing to continued recurrent GI bleeds.  He has not had any additional bleeding or concerns on aspirin alone.  He does question potential need of Plavix ? ?Reports routine lab work with PCP Dr. Delfina [name] which has been satisfactory (unable to view via epic) ?Blood pressure routinely monitored which has been stable ? ?No further concerns at this time ? ?Update 10/26/2019 JM:  ?[name] returns for follow-up regarding history of chronic strokes, TIAs and TIA versus seizure episode ?He is accompanied by his wife ?He has been stable from a neurological standpoint since prior visit without new or reoccurring stroke/TIA symptoms or seizure type symptoms. ?Residual stroke deficits of left spastic hemiparesis which has been stable not worsening ?Currently using Requip 2 mg nightly for LLE "jerking" previously working well but more recently he has noticed worsening symptoms and is requesting increased Requip dosage ?Continues to use AFO brace and Rollator walker at all times and denies any recent falls ?has had ED admissions since prior visit for acute on chronic GI bleed due to diverticulosis requiring transfusions with most recent admission 10/10/2019.  Recommended to hold Plavix for 1 week.  He has since restarted Plavix without evidence of reoccurring bleeding and denies bruising ?Continues on simvastatin without myalgias ?Blood pressure today 120/73 ?Continues to follow closely with PCP for HTN, HLD and DM management ?No further concerns ? ? ?Update 09/28/2018 Dr. Leonie [name] : He returns for follow-up after last visit with  me in April 2019.  He is accompanied by his wife.  He had an episode of possible TIA versus seizure in March 2020.  Patient had been admitted for diverticular bleed to the hospital and had been off Plavix for 5 days.  After the patient was discharged home in the same evening the wife noticed that he had speech difficulties and was unable to speak or verbalize he had was able unable to respond to the wife and a blank look on his face.  She called 911 patient went back to the hospital this episode of speech difficulty lasted 10 to 20 minutes.  Work-up in the emergency room included a CT scan without contrast which did not show acute abnormality.  CT angiogram of the head and neck was obtained which did not show any major stenosis.  MRI scan of the brain was also obtained which showed old basal ganglia infarct without any acute infarct and changes of small vessel disease and generalized atrophy.  Patient has done well since discharge has had no further recurrent episodes of speech disturbance, TIA or seizures.  Is tolerating Plavix with only minor bruising.  His blood pressures well controlled today 121/62.  His last hemoglobin A1c was 6.6 checked 2 months ago.  He remains on Zocor which is tolerating well without myalgias but does have some knee and foot pain.  Continues to have gait and balance difficulties.  He does use a wheeled walker and states he is careful and has not had any major falls or injuries.  He is having dysphagia and plans to have elective esophageal dilatation to be done by Dr. Ardis Hughs on 10/07/2018 and wants neurological clearance and to hold Plavix for the procedure. ? ?Update the 06/09/2017 Dr. Leonie [name] :he returns for follow-up after last for this 6 months ago. He is accompanied by his wife. He has not had any recurrent stroke or TIA symptoms. He continues to have left leg weakness and difficulty with gait and balance. He does use a wheeled walker all the time. He has not had any falls or injuries  recently. He states his memory difficulties unchanged and are not progressive. His remains on Plavix which is tolerating well with only minor bruising but no bleeding. States his blood pressure is under good

## 2021-05-13 ENCOUNTER — Telehealth: Payer: Self-pay

## 2021-05-13 ENCOUNTER — Telehealth: Payer: Self-pay | Admitting: Gastroenterology

## 2021-05-13 LAB — CBC
Hematocrit: 26.6 % — ABNORMAL LOW (ref 37.5–51.0)
Hemoglobin: 7.6 g/dL — ABNORMAL LOW (ref 13.0–17.7)
MCH: 22.2 pg — ABNORMAL LOW (ref 26.6–33.0)
MCHC: 28.6 g/dL — ABNORMAL LOW (ref 31.5–35.7)
MCV: 78 fL — ABNORMAL LOW (ref 79–97)
Platelets: 429 10*3/uL (ref 150–450)
RBC: 3.42 x10E6/uL — ABNORMAL LOW (ref 4.14–5.80)
RDW: 16.7 % — ABNORMAL HIGH (ref 11.6–15.4)
WBC: 10.8 10*3/uL (ref 3.4–10.8)

## 2021-05-13 LAB — COMPREHENSIVE METABOLIC PANEL
ALT: 9 IU/L (ref 0–44)
AST: 18 IU/L (ref 0–40)
Albumin/Globulin Ratio: 2 (ref 1.2–2.2)
Albumin: 4 g/dL (ref 3.6–4.6)
Alkaline Phosphatase: 73 IU/L (ref 44–121)
BUN/Creatinine Ratio: 24 (ref 10–24)
BUN: 15 mg/dL (ref 8–27)
Bilirubin Total: <0.2 mg/dL (ref 0.0–1.2)
CO2: 24 mmol/L (ref 20–29)
Calcium: 8.5 mg/dL — ABNORMAL LOW (ref 8.6–10.2)
Chloride: 100 mmol/L (ref 96–106)
Creatinine, Ser: 0.63 mg/dL — ABNORMAL LOW (ref 0.76–1.27)
Globulin, Total: 2 g/dL (ref 1.5–4.5)
Glucose: 138 mg/dL — ABNORMAL HIGH (ref 70–99)
Potassium: 4.5 mmol/L (ref 3.5–5.2)
Sodium: 136 mmol/L (ref 134–144)
Total Protein: 6 g/dL (ref 6.0–8.5)
eGFR: 92 mL/min/{1.73_m2} (ref >59)

## 2021-05-13 LAB — VALPROIC ACID LEVEL: Valproic Acid Lvl: 38 ug/mL — ABNORMAL LOW (ref 50–100)

## 2021-05-13 NOTE — Telephone Encounter (Signed)
Contacted PCP office, got transferred to [place] assistant's VM. LVM rq call back regarding mutual pts labs  ?

## 2021-05-13 NOTE — Telephone Encounter (Signed)
Pt's wife returned my call and we were able to discuss results of labs. She verbalized understanding and will also reach out to PCP's office on next steps and how to proceed. She voice appreciation for the call and confirmed the pt continues to have no symptoms of anemia. I advised if symptoms were to present to seek care at the ER immediately for possible transfusion.  ?

## 2021-05-13 NOTE — Telephone Encounter (Signed)
Received message from [place] labs show slight worsening of anemia and trying to contact PCP to further discuss. as long as no symptoms (which there were not any at yesterdays visit) i dont think he needs emergent evaluation (like ED eval with transfusion) but if any symptoms should develop, he should call 911 immediately. ? ?I attempted to call pt twice # would not connect to person or vm. ?I called pt's wife ok per dpr and left vm asking for a call back to discuss labs.  ?

## 2021-05-13 NOTE — Telephone Encounter (Signed)
After hours call. Pt and his wife calling after receiving CBC results from a Neurology appt. His Hgb has decreased from 11.3 on 1/25 to 7.6 on 3/27. His MCV has decreased from 85 to 78. He reports no visible blood loss and denies CP, SOB, fatigue.  ?Reassured the pt and his wife that this result requires further evaluation and mgmt, nothing urgent needs to occur tonight. Advised him to resume Fe bid with meals and that I will forward this message to Dr. Ardis Hughs for further plans. ?

## 2021-05-13 NOTE — Telephone Encounter (Signed)
-----   Message from Frann Rider, NP sent at 05/13/2021  3:12 PM EDT ----- ?Please contact PCP office due to abnormal CBC count - unsure if they want to follow up with patient for repeat labs or they would like patient to proceed to ED. Thank you.  ?

## 2021-05-14 ENCOUNTER — Other Ambulatory Visit: Payer: Self-pay

## 2021-05-14 DIAGNOSIS — D509 Iron deficiency anemia, unspecified: Secondary | ICD-10-CM

## 2021-05-14 NOTE — Telephone Encounter (Signed)
Pt returned call, stated Dr Delfina Redwood is out of the office and he spoke to someone there and GI dr and everything was straight. Advised to let us know if he needs anything.  ?

## 2021-05-14 NOTE — Telephone Encounter (Signed)
The pt has been advised of the appt with Dr Ardis Hughs on 5/1 ?He will come in for labs prior. Order has been entered  ?He agrees to have the BE- this has been sent to the schedulers to set up.  ?

## 2021-05-15 DIAGNOSIS — J4530 Mild persistent asthma, uncomplicated: Secondary | ICD-10-CM | POA: Diagnosis not present

## 2021-05-22 ENCOUNTER — Ambulatory Visit (HOSPITAL_COMMUNITY)
Admission: RE | Admit: 2021-05-22 | Discharge: 2021-05-22 | Disposition: A | Discharge status: Home / Self Care | Payer: Medicare Other | Source: Ambulatory Visit | Attending: Gastroenterology | Admitting: Gastroenterology

## 2021-05-22 DIAGNOSIS — R1310 Dysphagia, unspecified: Secondary | ICD-10-CM | POA: Insufficient documentation

## 2021-05-22 DIAGNOSIS — K449 Diaphragmatic hernia without obstruction or gangrene: Secondary | ICD-10-CM | POA: Diagnosis not present

## 2021-05-22 DIAGNOSIS — D649 Anemia, unspecified: Secondary | ICD-10-CM | POA: Diagnosis not present

## 2021-05-22 DIAGNOSIS — K224 Dyskinesia of esophagus: Secondary | ICD-10-CM | POA: Diagnosis not present

## 2021-05-26 ENCOUNTER — Other Ambulatory Visit (HOSPITAL_COMMUNITY): Payer: Self-pay

## 2021-05-26 ENCOUNTER — Other Ambulatory Visit: Payer: Self-pay

## 2021-05-26 DIAGNOSIS — R1310 Dysphagia, unspecified: Secondary | ICD-10-CM

## 2021-05-27 DIAGNOSIS — D649 Anemia, unspecified: Secondary | ICD-10-CM | POA: Diagnosis not present

## 2021-05-28 ENCOUNTER — Telehealth: Payer: Self-pay | Admitting: Gastroenterology

## 2021-05-28 NOTE — Telephone Encounter (Signed)
The patient has been notified of this information and all questions answered.

## 2021-05-28 NOTE — Telephone Encounter (Signed)
Patient returned your call, please advise. 

## 2021-05-28 NOTE — Telephone Encounter (Signed)
Grant Banister, MD  Timothy Lasso, RN ? ? ?Please call the patient.  Among onther findings, this suggests "dysfunctional swallowing".  I would like full speech therapy evaluation with MBSS, hopefully before his May appt with me.  Thanks  ?

## 2021-06-02 ENCOUNTER — Telehealth: Payer: Self-pay | Admitting: Gastroenterology

## 2021-06-02 NOTE — Telephone Encounter (Signed)
Patient aware of lab results per Dr Ardis Hughs. Patient advised to continue taking iron daily and he should keep follow up appointment scheduled for 06-16-21.  Patient agreed to plan and verbalized understanding.   ?

## 2021-06-02 NOTE — Telephone Encounter (Signed)
Blood work dated 05/28/2021 ? ?Outise CBC showed hemoglobin was 9.8, MCV 79, platelets 540 ? ? ?Please contact him, let him know that his blood counts are better than when we last heard about them.  He should continue iron supplements every single day.  I look forward to seeing him in the office in early May. ?

## 2021-06-03 ENCOUNTER — Ambulatory Visit (HOSPITAL_COMMUNITY)
Admission: RE | Admit: 2021-06-03 | Discharge: 2021-06-03 | Disposition: A | Discharge status: Home / Self Care | Payer: Medicare Other | Source: Ambulatory Visit | Attending: Gastroenterology | Admitting: Gastroenterology

## 2021-06-03 DIAGNOSIS — Z8719 Personal history of other diseases of the digestive system: Secondary | ICD-10-CM | POA: Diagnosis not present

## 2021-06-03 DIAGNOSIS — M1990 Unspecified osteoarthritis, unspecified site: Secondary | ICD-10-CM | POA: Insufficient documentation

## 2021-06-03 DIAGNOSIS — I110 Hypertensive heart disease with heart failure: Secondary | ICD-10-CM | POA: Insufficient documentation

## 2021-06-03 DIAGNOSIS — K449 Diaphragmatic hernia without obstruction or gangrene: Secondary | ICD-10-CM | POA: Insufficient documentation

## 2021-06-03 DIAGNOSIS — E785 Hyperlipidemia, unspecified: Secondary | ICD-10-CM | POA: Insufficient documentation

## 2021-06-03 DIAGNOSIS — J45909 Unspecified asthma, uncomplicated: Secondary | ICD-10-CM | POA: Insufficient documentation

## 2021-06-03 DIAGNOSIS — I5042 Chronic combined systolic (congestive) and diastolic (congestive) heart failure: Secondary | ICD-10-CM | POA: Diagnosis not present

## 2021-06-03 DIAGNOSIS — R1310 Dysphagia, unspecified: Secondary | ICD-10-CM | POA: Diagnosis not present

## 2021-06-03 DIAGNOSIS — K219 Gastro-esophageal reflux disease without esophagitis: Secondary | ICD-10-CM | POA: Insufficient documentation

## 2021-06-03 DIAGNOSIS — Z8673 Personal history of transient ischemic attack (TIA), and cerebral infarction without residual deficits: Secondary | ICD-10-CM | POA: Diagnosis not present

## 2021-06-03 DIAGNOSIS — E119 Type 2 diabetes mellitus without complications: Secondary | ICD-10-CM | POA: Diagnosis not present

## 2021-06-03 DIAGNOSIS — R6339 Other feeding difficulties: Secondary | ICD-10-CM | POA: Diagnosis not present

## 2021-06-03 NOTE — Progress Notes (Signed)
Modified Barium Swallow Progress Note ? ?Patient Details  ?Name: [name] ?MRN: [id number] ?Date of Birth: 04-19-1931 ? ?Today's Date: 06/03/2021 ? ?Modified Barium Swallow completed.  Full report located under Chart Review in the Imaging Section. ? ?Brief recommendations include the following: ? ?Clinical Impression ? Pt seen for outpatient MBS accompanied by wife and pt's caregiver who deny significant concerns. He does state that at times he has to regurgitate likley due to his esophageal dysmotility and narrowing. Oral phase was unremarkable. Pharyngeal phase he did have laryngeal penetration,flash and trace amount that remained in vestibule during one trial (PAS 2,3) due to slightly mistimed laryngeal closure. Once closed, the ROM and strength of swallow appeared within functional limits including epiglottic inversion. No aspiration observed. Pt's  cricopharyngues muscle is prominent although no backflow into pyriforms. Recommend pt continue with regular texture, thin liquids, crush meds (meds crushed at baseline d/t difficulty swallowing pills. Remain upright after meals and clear throat intermittently during meals. ?  ?Swallow Evaluation Recommendations ? ?   ? ? SLP Diet Recommendations: Regular solids;Thin liquid ? ? Liquid Administration via: Straw;Cup ? ? Medication Administration: Crushed with puree ? ? Supervision: Patient able to self feed ? ? Compensations: Slow rate;Small sips/bites;Clear throat intermittently ? ? Postural Changes: Seated upright at 90 degrees;Remain semi-upright after after feeds/meals (Comment) ? ? Oral Care Recommendations: Oral care BID ? ?   ? ? ? ?Houston Siren ?06/03/2021,1:59 PM ?

## 2021-06-12 DIAGNOSIS — G4700 Insomnia, unspecified: Secondary | ICD-10-CM | POA: Diagnosis not present

## 2021-06-12 DIAGNOSIS — E7800 Pure hypercholesterolemia, unspecified: Secondary | ICD-10-CM | POA: Diagnosis not present

## 2021-06-12 DIAGNOSIS — E119 Type 2 diabetes mellitus without complications: Secondary | ICD-10-CM | POA: Diagnosis not present

## 2021-06-12 DIAGNOSIS — J4520 Mild intermittent asthma, uncomplicated: Secondary | ICD-10-CM | POA: Diagnosis not present

## 2021-06-12 DIAGNOSIS — I10 Essential (primary) hypertension: Secondary | ICD-10-CM | POA: Diagnosis not present

## 2021-06-16 ENCOUNTER — Other Ambulatory Visit (INDEPENDENT_AMBULATORY_CARE_PROVIDER_SITE_OTHER): Payer: Medicare Other

## 2021-06-16 ENCOUNTER — Encounter: Payer: Self-pay | Admitting: Gastroenterology

## 2021-06-16 ENCOUNTER — Ambulatory Visit: Payer: Medicare Other | Admitting: Gastroenterology

## 2021-06-16 VITALS — BP 110/72 | HR 69 | Ht 70.0 in | Wt 150.0 lb

## 2021-06-16 DIAGNOSIS — K922 Gastrointestinal hemorrhage, unspecified: Secondary | ICD-10-CM

## 2021-06-16 DIAGNOSIS — R1310 Dysphagia, unspecified: Secondary | ICD-10-CM

## 2021-06-16 DIAGNOSIS — D509 Iron deficiency anemia, unspecified: Secondary | ICD-10-CM | POA: Diagnosis not present

## 2021-06-16 LAB — CBC WITH DIFFERENTIAL/PLATELET
Basophils Absolute: 0.1 10*3/uL (ref 0.0–0.1)
Basophils Relative: 1.4 % (ref 0.0–3.0)
Eosinophils Absolute: 0.5 10*3/uL (ref 0.0–0.7)
Eosinophils Relative: 5.5 % — ABNORMAL HIGH (ref 0.0–5.0)
HCT: 40.2 % (ref 39.0–52.0)
Hemoglobin: 12.4 g/dL — ABNORMAL LOW (ref 13.0–17.0)
Lymphocytes Relative: 24.6 % (ref 12.0–46.0)
Lymphs Abs: 2.1 10*3/uL (ref 0.7–4.0)
MCHC: 30.9 g/dL (ref 30.0–36.0)
MCV: 84.6 fl (ref 78.0–100.0)
Monocytes Absolute: 0.7 10*3/uL (ref 0.1–1.0)
Monocytes Relative: 7.8 % (ref 3.0–12.0)
Neutro Abs: 5.1 10*3/uL (ref 1.4–7.7)
Neutrophils Relative %: 60.7 % (ref 43.0–77.0)
Platelets: 294 10*3/uL (ref 150.0–400.0)
RBC: 4.75 Mil/uL (ref 4.22–5.81)
RDW: 28.5 % — ABNORMAL HIGH (ref 11.5–15.5)
WBC: 8.4 10*3/uL (ref 4.0–10.5)

## 2021-06-16 NOTE — Patient Instructions (Addendum)
If you are age 86 or older, your body mass index should be between 23-30. Your Body mass index is 21.52 kg/m?[place] If this is out of the aforementioned range listed, please consider follow up with your Primary Care Provider. ?_______________________________________________________ ? ?The [place] [facility] providers would like to encourage you to use [facility] to communicate with providers for non-urgent requests or questions.  Due to long hold times on the telephone, sending your provider a message by [facility] may be a faster and more efficient way to get a response.  Please allow 48 business hours for a response.  Please remember that this is for non-urgent requests.  ?_______________________________________________________ ? ?Your provider has requested that you go to the basement level for lab work before leaving today. Press "B" on the elevator. The lab is located at the first door on the left as you exit the elevator. ? ?Due to recent changes in healthcare laws, you may see the results of your imaging and laboratory studies on MyChart before your provider has had a chance to review them.  We understand that in some cases there may be results that are confusing or concerning to you. Not all laboratory results come back in the same time frame and the provider may be waiting for multiple results in order to interpret others.  Please give [place] 48 hours in order for your provider to thoroughly review all the results before contacting the office for clarification of your results.  ? ?Continue to follow instructions given by speech therapist: ?1- Sit upright while eating. ?2- Clear your throat while eating. ?3- Eat slowly. ? ?You will follow up in our office in 3 months (August 2023).  Please call our office to get this appointment scheduled. ? ?Thank you for entrusting me with your care and choosing [facility]. ? ?Dr Ardis Hughs ? ? ?

## 2021-06-16 NOTE — Progress Notes (Signed)
Review of pertinent gastrointestinal problems:  ?1. routine risk for colon cancer. Colonoscopy 2003 with Dr. Inocente Salles, polyps removed but none were adenomatous. Colonoscopy June 2009 Dr. Ardis Hughs found no polyps. Significant left sided diverticulosis was noted.  ?2. esophageal ring, focal stricture dilated 2009 with deeper than usual mucosal tear, followup Gastrografin esophagram showed no extravasation.  EGD attempt 02/2021 for dysphagia was unsuccessful, I could not intubate his esophagus given very tortuous UES, hypopharynx.  Mulitfactorial dysphagia: Barium esophagram 05/2021 suggested 1.  Significant dysmotility with incomplete esophageal clearing 2.  Partially obstructive distal esophageal ring 3.  Moderate in size paraesophageal hernia 4. Dysfunctional swallow with incomplete epiglottic inversion and vallecular stasis.  Follow-up speech therapy evaluation with modified barium swallow study 05/2021 showed "Prominent cricopharyngeus muscle indentation upon the posterioraspect of the lower hypopharynx and upper cervical esophagus."  The therapist suggested several maneuvers to help with his swallowing including crushing his pills, remaining upright after eating and clearing his throat intentionally several times during meal ?3. status post fundoplication  ?4. Recurrent GI bleeding, presumed diverticular. 2009 did not require blood transfusion; 2018 he was hospitalized twice for overt red rectal bleeding, presumed diverticular.  Hb nadir 6.8 (normally Hb 14). Colonoscopy 11/2016 Dr. Carlean Purl confirmed multiple left-sided diverticulosis pockets, the prep in the right side was poor.  There was no acute blood in the colon.  He also presumed that his acute  bleeding was diverticular in origin.  Chronic Plavix use probably contributes. 01/2018 another episode Hb nadir 8 (normally Hb 12s). Received two units PRBC.  Repeat CT scan 01/2018 with IV contrast (ordered by ER I believe) showed no clear source.  Nuclear medicine bleeding  scan was positive for active GI bleeding "with right colon as a suspected source of bleeding".  Follow-up interventional radiology angiogram was negative for acute bleeding.  Nuc med Meckel's scan was negative.  Repeat colonoscopy February 2021 showed diverticulosis with dark stool throughout polyp possibly melena.  EGD February 2021 showed hiatal hernia, some gastritis, essentially no obvious source of his GI bleeding.  He received a total of 5 units blood this admission.  01/2020 Admission seemed to be bleeding from a SB source (actively bleeding AVM on capsule endo), underwent 3 endoscopic procedures with eventual cautery of non-bleeding SB AVMs by enteroscopy.  Also stopped plavix in exchange for '81mg'$ ASA daily.  Recurrent bleed July/August 2022 small bowel enteroscopy found a single gastric AVM that was not bleeding however it was treated with APC. ?5. Splenectomy; 2009, ruptured. ? ? ?HPI: ?This is a very pleasant 88 year old man who returning 33 in about 2 months.  He is here with his wife and caregiver "Z" today. ? ?I last saw [name] about 3 months ago the time of the failed EGD.  See the results summarized below. ? ?Blood work 05/28/2021, outside CBC showed hemoglobin 9.8, MCV 79, platelets 540. ? ?He tries to follow the recommendations from speech therapy about sitting upright, crushing his pills, clearing his throat intentionally several times during a meal.  He is not always perfect with it, his wife or caregiver helps to remind him and that seems to annoy him over time. ? ?His dysphagia is about the same. ? ?Today he is sitting in a wheelchair which I have not seen him in before. ? ?He does not think he is losing weight and nor does his wife or caregiver. ? ?His stools are quite dark, solid however. ? ?Takes iron over-the-counter twice daily. ? ?No serious abdominal pains, no overt hematemesis ? ? ? ?  ROS: complete GI ROS as described in HPI, all other review negative. ? ?Constitutional:  No unintentional  weight loss ? ? ?Past Medical History:  ?Diagnosis Date  ? Acute CVA (cerebrovascular accident) ([place]) 07/25/2015  ? Asthma   ? Benign essential HTN   ? Cerebral infarction due to embolism of left middle cerebral artery ([place]) 02/03/2014  ? Chronic combined systolic and diastolic CHF (congestive heart failure) ([place]) 12/23/2017  ? Colitis   ? Colon polyps   ? Diabetes mellitus type 2, diet-controlled ([place]) 12/22/2013  ? Diverticulosis   ? GERD (gastroesophageal reflux disease)   ? GI bleed   ? GI bleed   ? Hemorrhoids   ? Hiatal hernia   ? History of esophageal strciture   ? HLD (hyperlipidemia)   ? Osteoarthritis   ? Proctitis   ? Status post dilation of esophageal narrowing   ? Stroke [facility])   ? ? ?Past Surgical History:  ?Procedure Laterality Date  ? ABDOMINAL HERNIA REPAIR    ? BIOPSY  04/05/2019  ? Procedure: BIOPSY;  Surgeon: Thornton Collison, MD;  Location: [place];  Service: Gastroenterology;;  ? COLONOSCOPY    ? multiple  ? COLONOSCOPY WITH PROPOFOL N/A 12/12/2016  ? Procedure: COLONOSCOPY WITH PROPOFOL;  Surgeon: Gatha Mayer, MD;  Location: [facility] ENDOSCOPY;  Service: Endoscopy;  Laterality: N/A;  ? COLONOSCOPY WITH PROPOFOL N/A 04/04/2019  ? Procedure: COLONOSCOPY WITH PROPOFOL;  Surgeon: Milus Banister, MD;  Location: [facility] ENDOSCOPY;  Service: Endoscopy;  Laterality: N/A;  ? ENTEROSCOPY N/A 01/16/2020  ? Procedure: ENTEROSCOPY;  Surgeon: Rush Landmark Telford Nab., MD;  Location: [facility] ENDOSCOPY;  Service: Gastroenterology;  Laterality: N/A;  ? ENTEROSCOPY N/A 01/20/2020  ? Procedure: ENTEROSCOPY;  Surgeon: Rush Landmark Telford Nab., MD;  Location: [facility] ENDOSCOPY;  Service: Gastroenterology;  Laterality: N/A;  ? ENTEROSCOPY N/A 09/16/2020  ? Procedure: ENTEROSCOPY;  Surgeon: Ladene Artist, MD;  Location: [facility] ENDOSCOPY;  Service: Endoscopy;  Laterality: N/A;  push enteroscopy  ? ESOPHAGOGASTRODUODENOSCOPY    ? multiple  ? ESOPHAGOGASTRODUODENOSCOPY (EGD) WITH PROPOFOL N/A 04/05/2019  ? Procedure:  ESOPHAGOGASTRODUODENOSCOPY (EGD) WITH PROPOFOL;  Surgeon: Thornton Cofer, MD;  Location: [place];  Service: Gastroenterology;  Laterality: N/A;  ? ESOPHAGOGASTRODUODENOSCOPY (EGD) WITH PROPOFOL N/A 01/18/2020  ? Procedure: ESOPHAGOGASTRODUODENOSCOPY (EGD) WITH PROPOFOL;  Surgeon: Doran Stabler, MD;  Location: [facility] ENDOSCOPY;  Service: Gastroenterology;  Laterality: N/A;  For placement of video capsule study  ? GIVENS CAPSULE STUDY N/A 01/16/2020  ? Procedure: GIVENS CAPSULE STUDY;  Surgeon: Irving Copas., MD;  Location: [facility] ENDOSCOPY;  Service: Gastroenterology;  Laterality: N/A;  ? GIVENS CAPSULE STUDY N/A 01/18/2020  ? Procedure: GIVENS CAPSULE STUDY;  Surgeon: Doran Stabler, MD;  Location: [facility] ENDOSCOPY;  Service: Gastroenterology;  Laterality: N/A;  ? HOT HEMOSTASIS N/A 01/20/2020  ? Procedure: HOT HEMOSTASIS (ARGON PLASMA COAGULATION/BICAP);  Surgeon: Irving Copas., MD;  Location: [facility] ENDOSCOPY;  Service: Gastroenterology;  Laterality: N/A;  ? HOT HEMOSTASIS N/A 09/16/2020  ? Procedure: HOT HEMOSTASIS (ARGON PLASMA COAGULATION/BICAP);  Surgeon: Ladene Artist, MD;  Location: [facility] ENDOSCOPY;  Service: Endoscopy;  Laterality: N/A;  ? INSERTION OF MESH  01/29/2012  ? Procedure: INSERTION OF MESH;  Surgeon: Gwenyth Ober, MD;  Location: [place];  Service: General;  Laterality: N/A;  ? IR ANGIOGRAM SELECTIVE EACH ADDITIONAL VESSEL  04/01/2019  ? IR ANGIOGRAM VISCERAL SELECTIVE  04/01/2019  ? IR ANGIOGRAM VISCERAL SELECTIVE  04/01/2019  ? IR ANGIOGRAM VISCERAL SELECTIVE  04/01/2019  ? IR  US GUIDE VASC ACCESS RIGHT  04/01/2019  ? NISSEN FUNDOPLICATION    ? SAVORY DILATION N/A 01/16/2020  ? Procedure: SAVORY DILATION;  Surgeon: Irving Copas., MD;  Location: [facility] ENDOSCOPY;  Service: Gastroenterology;  Laterality: N/A;  ? SAVORY DILATION N/A 09/16/2020  ? Procedure: SAVORY DILATION;  Surgeon: Ladene Artist, MD;  Location: [facility] ENDOSCOPY;  Service: Endoscopy;  Laterality: N/A;  ? SCLEROTHERAPY   01/20/2020  ? Procedure: SCLEROTHERAPY;  Surgeon: Mansouraty, Telford Nab., MD;  Location: [facility] ENDOSCOPY;  Service: Gastroenterology;;  ? SPLENECTOMY, TOTAL    ? nontraumatic rupture  ? SUBMUCOSAL TATTOO INJECTION  01/16/2020

## 2021-06-17 ENCOUNTER — Ambulatory Visit: Payer: Medicare Other | Admitting: Adult Health

## 2021-06-18 DIAGNOSIS — D692 Other nonthrombocytopenic purpura: Secondary | ICD-10-CM | POA: Diagnosis not present

## 2021-06-18 DIAGNOSIS — L570 Actinic keratosis: Secondary | ICD-10-CM | POA: Diagnosis not present

## 2021-06-18 DIAGNOSIS — Z85828 Personal history of other malignant neoplasm of skin: Secondary | ICD-10-CM | POA: Diagnosis not present

## 2021-06-18 DIAGNOSIS — L218 Other seborrheic dermatitis: Secondary | ICD-10-CM | POA: Diagnosis not present

## 2021-06-18 DIAGNOSIS — L812 Freckles: Secondary | ICD-10-CM | POA: Diagnosis not present

## 2021-06-18 DIAGNOSIS — D1801 Hemangioma of skin and subcutaneous tissue: Secondary | ICD-10-CM | POA: Diagnosis not present

## 2021-06-18 DIAGNOSIS — L821 Other seborrheic keratosis: Secondary | ICD-10-CM | POA: Diagnosis not present

## 2021-06-22 ENCOUNTER — Inpatient Hospital Stay (HOSPITAL_COMMUNITY)
Admission: EM | Admit: 2021-06-22 | Discharge: 2021-06-25 | DRG: 378 | Disposition: A | Discharge status: Home Health | Payer: Medicare Other | Source: Skilled Nursing Facility | Attending: Student | Admitting: Student

## 2021-06-22 ENCOUNTER — Telehealth: Payer: Self-pay | Admitting: Internal Medicine

## 2021-06-22 DIAGNOSIS — I959 Hypotension, unspecified: Secondary | ICD-10-CM | POA: Diagnosis not present

## 2021-06-22 DIAGNOSIS — K219 Gastro-esophageal reflux disease without esophagitis: Secondary | ICD-10-CM | POA: Diagnosis not present

## 2021-06-22 DIAGNOSIS — Z79899 Other long term (current) drug therapy: Secondary | ICD-10-CM | POA: Diagnosis not present

## 2021-06-22 DIAGNOSIS — K5731 Diverticulosis of large intestine without perforation or abscess with bleeding: Secondary | ICD-10-CM | POA: Diagnosis not present

## 2021-06-22 DIAGNOSIS — Z803 Family history of malignant neoplasm of breast: Secondary | ICD-10-CM | POA: Diagnosis not present

## 2021-06-22 DIAGNOSIS — I5042 Chronic combined systolic (congestive) and diastolic (congestive) heart failure: Secondary | ICD-10-CM | POA: Diagnosis present

## 2021-06-22 DIAGNOSIS — Z9081 Acquired absence of spleen: Secondary | ICD-10-CM

## 2021-06-22 DIAGNOSIS — E119 Type 2 diabetes mellitus without complications: Secondary | ICD-10-CM | POA: Diagnosis not present

## 2021-06-22 DIAGNOSIS — D62 Acute posthemorrhagic anemia: Secondary | ICD-10-CM | POA: Diagnosis present

## 2021-06-22 DIAGNOSIS — E785 Hyperlipidemia, unspecified: Secondary | ICD-10-CM | POA: Diagnosis present

## 2021-06-22 DIAGNOSIS — K5521 Angiodysplasia of colon with hemorrhage: Secondary | ICD-10-CM | POA: Diagnosis not present

## 2021-06-22 DIAGNOSIS — Z808 Family history of malignant neoplasm of other organs or systems: Secondary | ICD-10-CM

## 2021-06-22 DIAGNOSIS — Z8673 Personal history of transient ischemic attack (TIA), and cerebral infarction without residual deficits: Secondary | ICD-10-CM

## 2021-06-22 DIAGNOSIS — K922 Gastrointestinal hemorrhage, unspecified: Secondary | ICD-10-CM | POA: Diagnosis present

## 2021-06-22 DIAGNOSIS — Z7189 Other specified counseling: Secondary | ICD-10-CM | POA: Diagnosis not present

## 2021-06-22 DIAGNOSIS — I480 Paroxysmal atrial fibrillation: Secondary | ICD-10-CM | POA: Diagnosis not present

## 2021-06-22 DIAGNOSIS — Z66 Do not resuscitate: Secondary | ICD-10-CM | POA: Diagnosis not present

## 2021-06-22 DIAGNOSIS — Z7982 Long term (current) use of aspirin: Secondary | ICD-10-CM

## 2021-06-22 DIAGNOSIS — K31819 Angiodysplasia of stomach and duodenum without bleeding: Secondary | ICD-10-CM | POA: Diagnosis not present

## 2021-06-22 DIAGNOSIS — Z515 Encounter for palliative care: Secondary | ICD-10-CM | POA: Diagnosis not present

## 2021-06-22 DIAGNOSIS — J45909 Unspecified asthma, uncomplicated: Secondary | ICD-10-CM | POA: Diagnosis present

## 2021-06-22 DIAGNOSIS — D72828 Other elevated white blood cell count: Secondary | ICD-10-CM | POA: Diagnosis not present

## 2021-06-22 DIAGNOSIS — R6889 Other general symptoms and signs: Secondary | ICD-10-CM | POA: Diagnosis not present

## 2021-06-22 DIAGNOSIS — R404 Transient alteration of awareness: Secondary | ICD-10-CM | POA: Diagnosis not present

## 2021-06-22 DIAGNOSIS — I110 Hypertensive heart disease with heart failure: Secondary | ICD-10-CM | POA: Diagnosis not present

## 2021-06-22 DIAGNOSIS — K222 Esophageal obstruction: Secondary | ICD-10-CM | POA: Diagnosis not present

## 2021-06-22 DIAGNOSIS — I10 Essential (primary) hypertension: Secondary | ICD-10-CM | POA: Diagnosis present

## 2021-06-22 DIAGNOSIS — R1310 Dysphagia, unspecified: Secondary | ICD-10-CM

## 2021-06-22 DIAGNOSIS — E1165 Type 2 diabetes mellitus with hyperglycemia: Secondary | ICD-10-CM | POA: Diagnosis present

## 2021-06-22 DIAGNOSIS — R0902 Hypoxemia: Secondary | ICD-10-CM | POA: Diagnosis not present

## 2021-06-22 DIAGNOSIS — Z7984 Long term (current) use of oral hypoglycemic drugs: Secondary | ICD-10-CM

## 2021-06-22 DIAGNOSIS — R231 Pallor: Secondary | ICD-10-CM | POA: Diagnosis not present

## 2021-06-22 DIAGNOSIS — Z743 Need for continuous supervision: Secondary | ICD-10-CM | POA: Diagnosis not present

## 2021-06-22 DIAGNOSIS — K921 Melena: Secondary | ICD-10-CM

## 2021-06-22 DIAGNOSIS — I499 Cardiac arrhythmia, unspecified: Secondary | ICD-10-CM | POA: Diagnosis not present

## 2021-06-22 DIAGNOSIS — R1319 Other dysphagia: Secondary | ICD-10-CM | POA: Diagnosis not present

## 2021-06-22 MED ORDER — LACTATED RINGERS IV BOLUS
1000.0000 mL | Freq: Once | INTRAVENOUS | Status: AC
  Administered 2021-06-22: 1000 mL via INTRAVENOUS

## 2021-06-22 NOTE — Telephone Encounter (Signed)
Wife called to let me know patient on way to [facility] with hematochezia ?Presumed diverticular bleeding ?Via EMS ?Seen very recently by Dr. Ardis Hughs in clinic. ? ?He will be evaluated in the ER and if admission needed to hospitalist service ?GI consult team will be available. ? ?JMP ? ?

## 2021-06-22 NOTE — ED Triage Notes (Signed)
Patient from Home with hx of Diverticulitis with GI bleed. Today patient had rectal bleeding  starting at 7pm. Patient had a syncopal episode with EMS for aprox 10 sec. Patient was A&Ox4 after episode. Patient became hypotensive on scene. Patient was 90% on room air. EMS started 2L . Patient is DNR.  ?

## 2021-06-23 ENCOUNTER — Emergency Department (HOSPITAL_COMMUNITY): Payer: Medicare Other

## 2021-06-23 ENCOUNTER — Other Ambulatory Visit: Payer: Self-pay

## 2021-06-23 ENCOUNTER — Encounter (HOSPITAL_COMMUNITY): Payer: Self-pay

## 2021-06-23 DIAGNOSIS — R1319 Other dysphagia: Secondary | ICD-10-CM | POA: Diagnosis not present

## 2021-06-23 DIAGNOSIS — K5731 Diverticulosis of large intestine without perforation or abscess with bleeding: Secondary | ICD-10-CM | POA: Diagnosis present

## 2021-06-23 DIAGNOSIS — Z9081 Acquired absence of spleen: Secondary | ICD-10-CM | POA: Diagnosis not present

## 2021-06-23 DIAGNOSIS — K922 Gastrointestinal hemorrhage, unspecified: Secondary | ICD-10-CM | POA: Diagnosis not present

## 2021-06-23 DIAGNOSIS — Z7189 Other specified counseling: Secondary | ICD-10-CM | POA: Diagnosis not present

## 2021-06-23 DIAGNOSIS — I480 Paroxysmal atrial fibrillation: Secondary | ICD-10-CM | POA: Diagnosis not present

## 2021-06-23 DIAGNOSIS — K219 Gastro-esophageal reflux disease without esophagitis: Secondary | ICD-10-CM | POA: Diagnosis present

## 2021-06-23 DIAGNOSIS — Z7982 Long term (current) use of aspirin: Secondary | ICD-10-CM | POA: Diagnosis not present

## 2021-06-23 DIAGNOSIS — K5521 Angiodysplasia of colon with hemorrhage: Secondary | ICD-10-CM | POA: Diagnosis not present

## 2021-06-23 DIAGNOSIS — Z7984 Long term (current) use of oral hypoglycemic drugs: Secondary | ICD-10-CM | POA: Diagnosis not present

## 2021-06-23 DIAGNOSIS — E1165 Type 2 diabetes mellitus with hyperglycemia: Secondary | ICD-10-CM | POA: Diagnosis present

## 2021-06-23 DIAGNOSIS — I10 Essential (primary) hypertension: Secondary | ICD-10-CM | POA: Diagnosis not present

## 2021-06-23 DIAGNOSIS — Z79899 Other long term (current) drug therapy: Secondary | ICD-10-CM | POA: Diagnosis not present

## 2021-06-23 DIAGNOSIS — R1310 Dysphagia, unspecified: Secondary | ICD-10-CM | POA: Diagnosis present

## 2021-06-23 DIAGNOSIS — Z803 Family history of malignant neoplasm of breast: Secondary | ICD-10-CM | POA: Diagnosis not present

## 2021-06-23 DIAGNOSIS — K921 Melena: Secondary | ICD-10-CM | POA: Diagnosis present

## 2021-06-23 DIAGNOSIS — I5042 Chronic combined systolic (congestive) and diastolic (congestive) heart failure: Secondary | ICD-10-CM | POA: Diagnosis not present

## 2021-06-23 DIAGNOSIS — I110 Hypertensive heart disease with heart failure: Secondary | ICD-10-CM | POA: Diagnosis present

## 2021-06-23 DIAGNOSIS — E119 Type 2 diabetes mellitus without complications: Secondary | ICD-10-CM | POA: Diagnosis not present

## 2021-06-23 DIAGNOSIS — E785 Hyperlipidemia, unspecified: Secondary | ICD-10-CM | POA: Diagnosis present

## 2021-06-23 DIAGNOSIS — Z515 Encounter for palliative care: Secondary | ICD-10-CM | POA: Diagnosis not present

## 2021-06-23 DIAGNOSIS — I959 Hypotension, unspecified: Secondary | ICD-10-CM | POA: Diagnosis present

## 2021-06-23 DIAGNOSIS — Z8673 Personal history of transient ischemic attack (TIA), and cerebral infarction without residual deficits: Secondary | ICD-10-CM | POA: Diagnosis not present

## 2021-06-23 DIAGNOSIS — K222 Esophageal obstruction: Secondary | ICD-10-CM | POA: Diagnosis not present

## 2021-06-23 DIAGNOSIS — R0902 Hypoxemia: Secondary | ICD-10-CM | POA: Diagnosis not present

## 2021-06-23 DIAGNOSIS — Z808 Family history of malignant neoplasm of other organs or systems: Secondary | ICD-10-CM | POA: Diagnosis not present

## 2021-06-23 DIAGNOSIS — D62 Acute posthemorrhagic anemia: Secondary | ICD-10-CM

## 2021-06-23 DIAGNOSIS — K31819 Angiodysplasia of stomach and duodenum without bleeding: Secondary | ICD-10-CM | POA: Diagnosis not present

## 2021-06-23 DIAGNOSIS — Z66 Do not resuscitate: Secondary | ICD-10-CM | POA: Diagnosis not present

## 2021-06-23 DIAGNOSIS — D72828 Other elevated white blood cell count: Secondary | ICD-10-CM | POA: Diagnosis present

## 2021-06-23 DIAGNOSIS — J45909 Unspecified asthma, uncomplicated: Secondary | ICD-10-CM | POA: Diagnosis present

## 2021-06-23 LAB — CBC WITH DIFFERENTIAL/PLATELET
Abs Immature Granulocytes: 0.06 10*3/uL (ref 0.00–0.07)
Abs Immature Granulocytes: 0.09 10*3/uL — ABNORMAL HIGH (ref 0.00–0.07)
Basophils Absolute: 0.1 10*3/uL (ref 0.0–0.1)
Basophils Absolute: 0.1 10*3/uL (ref 0.0–0.1)
Basophils Relative: 1 %
Basophils Relative: 1 %
Eosinophils Absolute: 0.2 10*3/uL (ref 0.0–0.5)
Eosinophils Absolute: 0.9 10*3/uL — ABNORMAL HIGH (ref 0.0–0.5)
Eosinophils Relative: 1 %
Eosinophils Relative: 7 %
HCT: 30.5 % — ABNORMAL LOW (ref 39.0–52.0)
HCT: 35.1 % — ABNORMAL LOW (ref 39.0–52.0)
Hemoglobin: 10.5 g/dL — ABNORMAL LOW (ref 13.0–17.0)
Hemoglobin: 8.9 g/dL — ABNORMAL LOW (ref 13.0–17.0)
Immature Granulocytes: 0 %
Immature Granulocytes: 1 %
Lymphocytes Relative: 15 %
Lymphocytes Relative: 21 %
Lymphs Abs: 2.2 10*3/uL (ref 0.7–4.0)
Lymphs Abs: 2.9 10*3/uL (ref 0.7–4.0)
MCH: 26 pg (ref 26.0–34.0)
MCH: 26.6 pg (ref 26.0–34.0)
MCHC: 29.2 g/dL — ABNORMAL LOW (ref 30.0–36.0)
MCHC: 29.9 g/dL — ABNORMAL LOW (ref 30.0–36.0)
MCV: 89.1 fL (ref 80.0–100.0)
MCV: 89.2 fL (ref 80.0–100.0)
Monocytes Absolute: 1 10*3/uL (ref 0.1–1.0)
Monocytes Absolute: 1.1 10*3/uL — ABNORMAL HIGH (ref 0.1–1.0)
Monocytes Relative: 7 %
Monocytes Relative: 8 %
Neutro Abs: 11.1 10*3/uL — ABNORMAL HIGH (ref 1.7–7.7)
Neutro Abs: 8.9 10*3/uL — ABNORMAL HIGH (ref 1.7–7.7)
Neutrophils Relative %: 62 %
Neutrophils Relative %: 76 %
Platelets: 242 10*3/uL (ref 150–400)
Platelets: 265 10*3/uL (ref 150–400)
RBC: 3.42 MIL/uL — ABNORMAL LOW (ref 4.22–5.81)
RBC: 3.94 MIL/uL — ABNORMAL LOW (ref 4.22–5.81)
RDW: 27.5 % — ABNORMAL HIGH (ref 11.5–15.5)
RDW: 27.6 % — ABNORMAL HIGH (ref 11.5–15.5)
WBC: 14 10*3/uL — ABNORMAL HIGH (ref 4.0–10.5)
WBC: 14.5 10*3/uL — ABNORMAL HIGH (ref 4.0–10.5)
nRBC: 0 % (ref 0.0–0.2)
nRBC: 0 % (ref 0.0–0.2)

## 2021-06-23 LAB — COMPREHENSIVE METABOLIC PANEL
ALT: 11 U/L (ref 0–44)
ALT: 12 U/L (ref 0–44)
AST: 14 U/L — ABNORMAL LOW (ref 15–41)
AST: 15 U/L (ref 15–41)
Albumin: 2.8 g/dL — ABNORMAL LOW (ref 3.5–5.0)
Albumin: 3 g/dL — ABNORMAL LOW (ref 3.5–5.0)
Alkaline Phosphatase: 39 U/L (ref 38–126)
Alkaline Phosphatase: 43 U/L (ref 38–126)
Anion gap: 5 (ref 5–15)
Anion gap: 5 (ref 5–15)
BUN: 20 mg/dL (ref 8–23)
BUN: 23 mg/dL (ref 8–23)
CO2: 26 mmol/L (ref 22–32)
CO2: 28 mmol/L (ref 22–32)
Calcium: 8 mg/dL — ABNORMAL LOW (ref 8.9–10.3)
Calcium: 8.3 mg/dL — ABNORMAL LOW (ref 8.9–10.3)
Chloride: 106 mmol/L (ref 98–111)
Chloride: 108 mmol/L (ref 98–111)
Creatinine, Ser: 0.64 mg/dL (ref 0.61–1.24)
Creatinine, Ser: 0.68 mg/dL (ref 0.61–1.24)
GFR, Estimated: >60 mL/min (ref >60)
GFR, Estimated: >60 mL/min (ref >60)
Glucose, Bld: 151 mg/dL — ABNORMAL HIGH (ref 70–99)
Glucose, Bld: 189 mg/dL — ABNORMAL HIGH (ref 70–99)
Potassium: 4.2 mmol/L (ref 3.5–5.1)
Potassium: 4.3 mmol/L (ref 3.5–5.1)
Sodium: 139 mmol/L (ref 135–145)
Sodium: 139 mmol/L (ref 135–145)
Total Bilirubin: 0.5 mg/dL (ref 0.3–1.2)
Total Bilirubin: 0.6 mg/dL (ref 0.3–1.2)
Total Protein: 5 g/dL — ABNORMAL LOW (ref 6.5–8.1)
Total Protein: 5.4 g/dL — ABNORMAL LOW (ref 6.5–8.1)

## 2021-06-23 LAB — APTT: aPTT: 23 seconds — ABNORMAL LOW (ref 24–36)

## 2021-06-23 LAB — CBG MONITORING, ED
Glucose-Capillary: 151 mg/dL — ABNORMAL HIGH (ref 70–99)
Glucose-Capillary: 161 mg/dL — ABNORMAL HIGH (ref 70–99)
Glucose-Capillary: 129 mg/dL — ABNORMAL HIGH (ref 70–99)

## 2021-06-23 LAB — MRSA NEXT GEN BY PCR, NASAL: MRSA by PCR Next Gen: NOT DETECTED

## 2021-06-23 LAB — PHOSPHORUS: Phosphorus: 3.3 mg/dL (ref 2.5–4.6)

## 2021-06-23 LAB — GLUCOSE, CAPILLARY
Glucose-Capillary: 120 mg/dL — ABNORMAL HIGH (ref 70–99)
Glucose-Capillary: 125 mg/dL — ABNORMAL HIGH (ref 70–99)
Glucose-Capillary: 187 mg/dL — ABNORMAL HIGH (ref 70–99)

## 2021-06-23 LAB — TYPE AND SCREEN
ABO/RH(D): O POS
Antibody Screen: NEGATIVE

## 2021-06-23 LAB — HEMOGLOBIN AND HEMATOCRIT, BLOOD
HCT: 31.5 % — ABNORMAL LOW (ref 39.0–52.0)
HCT: 29.1 % — ABNORMAL LOW (ref 39.0–52.0)
HCT: 28.4 % — ABNORMAL LOW (ref 39.0–52.0)
Hemoglobin: 9.5 g/dL — ABNORMAL LOW (ref 13.0–17.0)
Hemoglobin: 8.9 g/dL — ABNORMAL LOW (ref 13.0–17.0)
Hemoglobin: 8.6 g/dL — ABNORMAL LOW (ref 13.0–17.0)

## 2021-06-23 LAB — PROTIME-INR
INR: 1.1 (ref 0.8–1.2)
Prothrombin Time: 13.9 seconds (ref 11.4–15.2)

## 2021-06-23 LAB — MAGNESIUM: Magnesium: 2.1 mg/dL (ref 1.7–2.4)

## 2021-06-23 MED ORDER — SIMVASTATIN 40 MG PO TABS
40.0000 mg | ORAL_TABLET | Freq: Every day | ORAL | Status: DC
Start: 2021-06-23 — End: 2021-06-25
  Administered 2021-06-23 – 2021-06-24 (×2): 40 mg via ORAL
  Filled 2021-06-23: qty 2
  Filled 2021-06-23: qty 1

## 2021-06-23 MED ORDER — INSULIN ASPART 100 UNIT/ML IJ SOLN
0.0000 [IU] | INTRAMUSCULAR | Status: DC
  Administered 2021-06-23 (×2): 1 [IU] via SUBCUTANEOUS
  Administered 2021-06-23: 2 [IU] via SUBCUTANEOUS
  Administered 2021-06-24 – 2021-06-25 (×3): 1 [IU] via SUBCUTANEOUS
  Administered 2021-06-25: 2 [IU] via SUBCUTANEOUS
  Filled 2021-06-23: qty 0.09

## 2021-06-23 MED ORDER — LACTATED RINGERS IV SOLN: INTRAVENOUS | Status: DC

## 2021-06-23 MED ORDER — CALCIUM GLUCONATE-NACL 1-0.675 GM/50ML-% IV SOLN
1.0000 g | Freq: Once | INTRAVENOUS | Status: AC
  Administered 2021-06-23: 1000 mg via INTRAVENOUS
  Filled 2021-06-23: qty 50

## 2021-06-23 MED ORDER — ACETAMINOPHEN 325 MG PO TABS: 650.0000 mg | ORAL_TABLET | Freq: Four times a day (QID) | ORAL | Status: DC | PRN

## 2021-06-23 MED ORDER — PROCHLORPERAZINE EDISYLATE 10 MG/2ML IJ SOLN: 10.0000 mg | Freq: Four times a day (QID) | INTRAMUSCULAR | Status: DC | PRN

## 2021-06-23 MED ORDER — PANTOPRAZOLE SODIUM 40 MG IV SOLR
40.0000 mg | Freq: Two times a day (BID) | INTRAVENOUS | Status: DC
Start: 2021-06-23 — End: 2021-06-25
  Administered 2021-06-23 – 2021-06-25 (×6): 40 mg via INTRAVENOUS
  Filled 2021-06-23 (×6): qty 10

## 2021-06-23 MED ORDER — MELATONIN 3 MG PO TABS: 3.0000 mg | ORAL_TABLET | Freq: Every evening | ORAL | Status: DC | PRN

## 2021-06-23 NOTE — H&P (Addendum)
?History and Physical ? ?[name] GDJ:[id number] DOB: [birth year] DOA: 06/22/2021 ? ?Referring physician: Dr. Florina Ou, [place]  ?PCP: Seward Carol, MD  ?Outpatient Specialists: GI, pulmonary, neurology. ?Patient coming from: Assisted living facility. ? ?Chief Complaint: Bright red blood per rectum. ? ?HPI: [name] is a 88 y.o. male with medical history significant for previous GI bleed presumed diverticular, multifactorial dysphagia, paroxysmal A-fib not anticoagulated, gastric AVM on daily PPI, post CVA on aspirin, esophageal ring, focal stricture dilated in 2009, ruptured spleen status with splenectomy in 2009, presented to [facility] ED from assisted living facility due to bright red blood per rectum noted last night around 7 PM (06/22/21).  Last use of baby aspirin was 2 days ago.  No abdominal pain or nausea.  Associated with a syncopal episode by EMS for approximately 10 seconds and transient hypotension improved with IV fluid.  He was brought into the ED for further evaluation.  In the ED, he received a liter of LR bolus x1.  Responded well to IV fluids bolus and maintaining his MAP greater than 65.  Hemoglobin on presentation 10.5K from 12.4K on 06/16/2021.  Further drop of hemoglobin down to 9.5K.  Serial H&H in place every 6 hours.  TRH, hospitalist team, asked to admit. ? ?To note, the patient had another large dark stool reported by bedside RN. ? ?ED Course: Tmax 97.4.  BP 110/75, pulse 66, respiratory rate 19, O2 saturation 99% on 2 L nasal cannula.  Lab studies remarkable for WBC 14.0, hemoglobin 9.5, MCV 89, platelet count 265.  Serum glucose 189, calcium 8.0, albumin 3.0, corrected calcium for albumin 8.8. ? ?Review of Systems: ?Review of systems as noted in the HPI. All other systems reviewed and are negative. ? ? ?Past Medical History:  ?Diagnosis Date  ? Acute CVA (cerebrovascular accident) ([place]) 07/25/2015  ? Asthma   ? Benign essential HTN   ? Cerebral infarction due to embolism of left middle cerebral  artery ([place]) 02/03/2014  ? Chronic combined systolic and diastolic CHF (congestive heart failure) ([place]) 12/23/2017  ? Colitis   ? Colon polyps   ? Diabetes mellitus type 2, diet-controlled ([place]) 12/22/2013  ? Diverticulosis   ? GERD (gastroesophageal reflux disease)   ? GI bleed   ? GI bleed   ? Hemorrhoids   ? Hiatal hernia   ? History of esophageal strciture   ? HLD (hyperlipidemia)   ? Osteoarthritis   ? Proctitis   ? Status post dilation of esophageal narrowing   ? Stroke [facility])   ? ?Past Surgical History:  ?Procedure Laterality Date  ? ABDOMINAL HERNIA REPAIR    ? BIOPSY  04/05/2019  ? Procedure: BIOPSY;  Surgeon: Thornton Zillmer, MD;  Location: [place];  Service: Gastroenterology;;  ? COLONOSCOPY    ? multiple  ? COLONOSCOPY WITH PROPOFOL N/A 12/12/2016  ? Procedure: COLONOSCOPY WITH PROPOFOL;  Surgeon: Gatha Mayer, MD;  Location: [facility] ENDOSCOPY;  Service: Endoscopy;  Laterality: N/A;  ? COLONOSCOPY WITH PROPOFOL N/A 04/04/2019  ? Procedure: COLONOSCOPY WITH PROPOFOL;  Surgeon: Milus Banister, MD;  Location: [facility] ENDOSCOPY;  Service: Endoscopy;  Laterality: N/A;  ? ENTEROSCOPY N/A 01/16/2020  ? Procedure: ENTEROSCOPY;  Surgeon: Rush Landmark Telford Nab., MD;  Location: [facility] ENDOSCOPY;  Service: Gastroenterology;  Laterality: N/A;  ? ENTEROSCOPY N/A 01/20/2020  ? Procedure: ENTEROSCOPY;  Surgeon: Rush Landmark Telford Nab., MD;  Location: [facility] ENDOSCOPY;  Service: Gastroenterology;  Laterality: N/A;  ? ENTEROSCOPY N/A 09/16/2020  ? Procedure: ENTEROSCOPY;  Surgeon: Lucio Edward  T, MD;  Location: [facility] ENDOSCOPY;  Service: Endoscopy;  Laterality: N/A;  push enteroscopy  ? ESOPHAGOGASTRODUODENOSCOPY    ? multiple  ? ESOPHAGOGASTRODUODENOSCOPY (EGD) WITH PROPOFOL N/A 04/05/2019  ? Procedure: ESOPHAGOGASTRODUODENOSCOPY (EGD) WITH PROPOFOL;  Surgeon: Thornton Vogel, MD;  Location: [place];  Service: Gastroenterology;  Laterality: N/A;  ? ESOPHAGOGASTRODUODENOSCOPY (EGD) WITH PROPOFOL N/A 01/18/2020  ? Procedure:  ESOPHAGOGASTRODUODENOSCOPY (EGD) WITH PROPOFOL;  Surgeon: Doran Stabler, MD;  Location: [facility] ENDOSCOPY;  Service: Gastroenterology;  Laterality: N/A;  For placement of video capsule study  ? GIVENS CAPSULE STUDY N/A 01/16/2020  ? Procedure: GIVENS CAPSULE STUDY;  Surgeon: Irving Copas., MD;  Location: [facility] ENDOSCOPY;  Service: Gastroenterology;  Laterality: N/A;  ? GIVENS CAPSULE STUDY N/A 01/18/2020  ? Procedure: GIVENS CAPSULE STUDY;  Surgeon: Doran Stabler, MD;  Location: [facility] ENDOSCOPY;  Service: Gastroenterology;  Laterality: N/A;  ? HOT HEMOSTASIS N/A 01/20/2020  ? Procedure: HOT HEMOSTASIS (ARGON PLASMA COAGULATION/BICAP);  Surgeon: Irving Copas., MD;  Location: [facility] ENDOSCOPY;  Service: Gastroenterology;  Laterality: N/A;  ? HOT HEMOSTASIS N/A 09/16/2020  ? Procedure: HOT HEMOSTASIS (ARGON PLASMA COAGULATION/BICAP);  Surgeon: Ladene Artist, MD;  Location: [facility] ENDOSCOPY;  Service: Endoscopy;  Laterality: N/A;  ? INSERTION OF MESH  01/29/2012  ? Procedure: INSERTION OF MESH;  Surgeon: Gwenyth Ober, MD;  Location: [place];  Service: General;  Laterality: N/A;  ? IR ANGIOGRAM SELECTIVE EACH ADDITIONAL VESSEL  04/01/2019  ? IR ANGIOGRAM VISCERAL SELECTIVE  04/01/2019  ? IR ANGIOGRAM VISCERAL SELECTIVE  04/01/2019  ? IR ANGIOGRAM VISCERAL SELECTIVE  04/01/2019  ? IR US GUIDE VASC ACCESS RIGHT  04/01/2019  ? NISSEN FUNDOPLICATION    ? SAVORY DILATION N/A 01/16/2020  ? Procedure: SAVORY DILATION;  Surgeon: Irving Copas., MD;  Location: [facility] ENDOSCOPY;  Service: Gastroenterology;  Laterality: N/A;  ? SAVORY DILATION N/A 09/16/2020  ? Procedure: SAVORY DILATION;  Surgeon: Ladene Artist, MD;  Location: [facility] ENDOSCOPY;  Service: Endoscopy;  Laterality: N/A;  ? SCLEROTHERAPY  01/20/2020  ? Procedure: SCLEROTHERAPY;  Surgeon: Mansouraty, Telford Nab., MD;  Location: [facility] ENDOSCOPY;  Service: Gastroenterology;;  ? SPLENECTOMY, TOTAL    ? nontraumatic rupture  ? SUBMUCOSAL TATTOO INJECTION  01/16/2020  ?  Procedure: SUBMUCOSAL TATTOO INJECTION;  Surgeon: Irving Copas., MD;  Location: [facility] ENDOSCOPY;  Service: Gastroenterology;;  ? [place]  01/29/2012   ? WITH MESH  ? VENTRAL HERNIA REPAIR  01/29/2012  ? Procedure: HERNIA REPAIR VENTRAL ADULT;  Surgeon: Gwenyth Ober, MD;  Location: [place];  Service: General;  Laterality: N/A;  open recurrent ventral hernia repair with mesh  ? ? ?Social History:  reports that he has never smoked. He has never used smokeless tobacco. He reports current alcohol use of about 2.0 standard drinks per week. He reports that he does not use drugs. ? ? ?Allergies  ?Allergen Reactions  ? Tape Other (See Comments)  ?  SKIN IS SENSITIVE; PLEASE USE PAPER TAPE; SKIN BRUISES AND TEARS EASILY!!  ? ? ?Family History  ?Problem Relation Age of Onset  ? Alzheimer's disease Mother   ? Breast cancer Mother   ? Throat cancer Father   ? Colon cancer Neg Hx   ? Colon polyps Neg Hx   ? Pancreatic cancer Neg Hx   ? Stomach cancer Neg Hx   ? Liver disease Neg Hx   ? Esophageal cancer Neg Hx   ? Rectal cancer Neg Hx   ?  ? ? ?  Prior to Admission medications   ?Medication Sig Start Date End Date Taking? Authorizing Provider  ?acetaminophen (TYLENOL) 325 MG tablet Take 2 tablets (650 mg total) by mouth every 6 (six) hours as needed for mild pain, fever or headache. 03/12/21 03/12/22  Elgergawy, Silver Huguenin, MD  ?albuterol (VENTOLIN HFA) 108 (90 Base) MCG/ACT inhaler Inhale 2 puffs into the lungs every 6 (six) hours as needed for wheezing or shortness of breath.    [provider]  ?aspirin EC 81 MG tablet Take 1 tablet (81 mg total) by mouth daily. Swallow whole. 08/20/20 08/20/21  Rai, Ripudeep K, MD  ?budesonide (PULMICORT) 0.5 MG/2ML nebulizer solution Take 2 mLs by nebulization daily. 19m by nebulization daily ?And 223mby nebulization daily as needed for shortness of breath/wheezing 09/01/18   [provider]  ?carvedilol (COREG) 3.125 MG tablet Take 1 tablet (3.125 mg total) by  mouth 2 (two) times daily with a meal. 03/12/21   Elgergawy, DaSilver HugueninMD  ?cholecalciferol (VITAMIN D) 25 MCG (1000 UNIT) tablet Take 1,000 Units by mouth daily.    [provider]  ?Cyanocobalamin (VITAM

## 2021-06-23 NOTE — ED Notes (Signed)
Patient has a bowel movement. Patient was cleaned and repositioned in bed.  ?

## 2021-06-23 NOTE — Hospital Course (Addendum)
88 year old M with PMH of GI bleed presumed diverticular, gastric AVM, paroxysmal A-fib not on AC, diastolic CHF, CVA, or esophageal ring and stricture s/p dilation in 2009, multifactorial dysphagia and ruptured spleen s/p splenectomy in 2009 presenting with bright red blood per rectum and admitted for acute on chronic blood loss anemia felt to be diverticular.  Hgb 10.5 (baseline appears to be 11-12).  GI consulted and did not feel scope is needed as he has not had further GI bleed and his hemoglobin remained stable in the range of 8-9 after initial drop.  He was cleared for discharge by gastroenterology for outpatient follow-up.  GI suggested resuming aspirin on discharge.  ? ?Therapy recommended SNF but patient and family refused.  He is discharged back to ALF with home health. ? ? ?

## 2021-06-23 NOTE — Progress Notes (Signed)
?  Progress Note ? ? ?PatientSIDDHANT [name] LDJ:[id number] DOB: 1931-07-30 DOA: 06/22/2021     0 ?DOS: the patient was seen and examined on 06/23/2021 ?  ?Brief hospital course: ?88 y.o. male with medical history significant for previous GI bleed presumed diverticular, multifactorial dysphagia, paroxysmal A-fib not anticoagulated, gastric AVM on daily PPI, post CVA on aspirin, esophageal ring, focal stricture dilated in 2009, ruptured spleen status with splenectomy in 2009, presented to [facility] ED from assisted living facility due to bright red blood per rectum ? ?ED Course: Tmax 97.4.  BP 110/75, pulse 66, respiratory rate 19, O2 saturation 99% on 2 L nasal cannula.  Lab studies remarkable for WBC 14.0, hemoglobin 9.5, MCV 89, platelet count 265.  Serum glucose 189, calcium 8.0, albumin 3.0, corrected calcium for albumin 8.8. ? ?Assessment and Plan: ?No notes have been filed under this hospital service. ?Service: Hospitalist ? ?Hematochezia ?History of recurrent GI bleed, presumed diverticular ?History of gastritis, active bleeding AVM seen on capsule endoscopy in December 2021, recurrent bleed July/August 2022 small bowel enteroscopy found a single gastric AVM that was not bleeding but treated with APC. ?Initially started on IV Protonix 40 mg twice daily ?Appreciate input by GI. Patient with long history of GI bleeding and is well know to GI service. No plans for further testing including endoscopy ?Palliative Care recommended by GI, will consult ?  ?Acute blood loss anemia in the setting of hematochezia ?Baseline hemoglobin appears to be 12 K ?Hemodynamically stable ?  ?History of CVA on aspirin ?Last use of aspirin was 2 days ago. ?On zocor ?  ?Paroxysmal A-fib not anticoagulated ?Rate is currently controlled ?Home Coreg held due to soft BPs to avoid hypotension. ?Resume home medications when appropriate ?  ?Brief syncope secondary to hypotension ?Syncopal episode lasting about 10 seconds per EMS, hypotensive afterwards. ?  Received IV fluid bolus in the ED 1 L LR. ?On gentle IVF hydration. ?PT/OT were consulted ?  ?Prediabetes with hyperglycemia ?Last hemoglobin A1c 6.3 on 03/09/2021 ?Cont on SSI ?Diet resumed per GI ?  ?Mild hypocalcemia ?Calcium correction for albumin 8.8 ?  ?Leukocytosis, suspect reactive in the setting of GI bleed. ?Monitor fever curve and WBC ?Pt hemodynamically stable ?  ? ?  ? ?Subjective: Denies abd pain or chest pain this AM. No nausea ? ?Physical Exam: ?Vitals:  ? 06/23/21 0600 06/23/21 0800 06/23/21 0910 06/23/21 1310  ?BP: (!) 118/107 (!) 119/55 (!) 157/74 137/72  ?Pulse: 93 84 84 83  ?Resp: (!) '21 20 18 18  '$ ?Temp:  98 ?F (36.7 ?C) 97.7 ?F (36.5 ?C) 97.9 ?F (36.6 ?C)  ?TempSrc:  Oral Oral Oral  ?SpO2: 96% 96% 97% 98%  ?Weight:  68 kg    ?Height:  '5\' 10"'$  (1.778 m)    ? ?General exam: Awake, laying in bed, in nad ?Respiratory system: Normal respiratory effort, no wheezing ?Cardiovascular system: regular rate, s1, s2 ?Gastrointestinal system: Soft, nondistended, positive BS ?Central nervous system: CN2-12 grossly intact, strength intact ?Extremities: Perfused, no clubbing ?Skin: Normal skin turgor, no notable skin lesions seen ?Psychiatry: Mood normal // no visual hallucinations  ? ?Data Reviewed: ? ?Labs reviewed, hgb 8.6 ? ?Family Communication: Pt in room, family not at bedside ? ?Disposition: ?Status is: Inpatient ?Remains inpatient appropriate because: Severity of illness  ? Planned Discharge Destination: Home ? ? ? ?Author: ?Marylu Lund, MD ?06/23/2021 4:05 PM ? ?For on call review [email or web address].  ?

## 2021-06-23 NOTE — Consult Note (Addendum)
? ? ?Consultation ? ?Referring Provider: TRH/ [place] Crane DO ?Primary Care Physician:  Seward Carol, MD ?Primary Gastroenterologist:  Dr.Dilana Mcphie ? ?Reason for Consultation:   GI Bleed ? ?HPI: [name] is a 88 y.o. male well-known to Dr. Ardis Hughs who had just been seen in the office on 06/16/2021 for evaluation of dysphagia.  Patient has history of recurrent GI bleeding and was admitted last night from his assisted living facility after he had onset of painless bright red blood per rectum.  He had 1 episode within tried to go to bed, had urge for another bowel movement, got up and with that episode had syncope, he did not injure himself.  He was brought to the emergency room.  He has been hemodynamically stable since. ?Initial hemoglobin 10.5, down from 12.4 on 06/16/2021.  Overnight hemoglobin had drifted to 9.5 then 8.9 this morning.  It looks as if he had 2 episodes of bloody stools since arrival in the emergency room, and is currently sitting on the bedside commode.  He denies any dizziness or lightheadedness, denies any abdominal pain or cramping. ?He is not certain when he last took baby aspirin, no NSAID use. ?He has history of recurrent diverticular bleeding and had been hospitalized in 2018 twice for diverticular bleeding.  He did have colonoscopy done at that time which showed multiple left colon diverticuli and somewhat less than adequate prep of the right colon. ?He has also had bleeding from AVMs.  Last work-up was in August 2022 with melena.  He had enteroscopy done at that time with finding of a single gastric AVM which was treated with APC.  Prior to that work-up in November/December 2021 with a EGD and then enteroscopy showing hiatal hernia,, there were 2 spots in the duodenum and 2 spots in the jejunum, treated with APC. ? ?He also had EGD in January 2023 for dysphagia and was found to have a tight cricopharyngeus, unable to intubate the esophagus, then had him swallow in April 2023 which showed evidence  of esophageal dysmotility, a partially obstructing distal esophageal ring, moderate paraesophageal hernia and incomplete epiglottic inversion.  Status post remote Nissen fundoplication. ? ?Patient has history of prior CVA, congestive heart failure, is status postsplenectomy, has hypertension. ? ? ?Past Medical History:  ?Diagnosis Date  ? Acute CVA (cerebrovascular accident) ([place]) 07/25/2015  ? Asthma   ? Benign essential HTN   ? Cerebral infarction due to embolism of left middle cerebral artery ([place]) 02/03/2014  ? Chronic combined systolic and diastolic CHF (congestive heart failure) ([place]) 12/23/2017  ? Colitis   ? Colon polyps   ? Diabetes mellitus type 2, diet-controlled ([place]) 12/22/2013  ? Diverticulosis   ? GERD (gastroesophageal reflux disease)   ? GI bleed   ? GI bleed   ? Hemorrhoids   ? Hiatal hernia   ? History of esophageal strciture   ? HLD (hyperlipidemia)   ? Osteoarthritis   ? Proctitis   ? Status post dilation of esophageal narrowing   ? Stroke [facility])   ? ? ?Past Surgical History:  ?Procedure Laterality Date  ? ABDOMINAL HERNIA REPAIR    ? BIOPSY  04/05/2019  ? Procedure: BIOPSY;  Surgeon: Thornton Blomberg, MD;  Location: [place];  Service: Gastroenterology;;  ? COLONOSCOPY    ? multiple  ? COLONOSCOPY WITH PROPOFOL N/A 12/12/2016  ? Procedure: COLONOSCOPY WITH PROPOFOL;  Surgeon: Gatha Mayer, MD;  Location: [facility] ENDOSCOPY;  Service: Endoscopy;  Laterality: N/A;  ? COLONOSCOPY WITH PROPOFOL N/A 04/04/2019  ?  Procedure: COLONOSCOPY WITH PROPOFOL;  Surgeon: Milus Banister, MD;  Location: [facility] ENDOSCOPY;  Service: Endoscopy;  Laterality: N/A;  ? ENTEROSCOPY N/A 01/16/2020  ? Procedure: ENTEROSCOPY;  Surgeon: Rush Landmark Telford Nab., MD;  Location: [facility] ENDOSCOPY;  Service: Gastroenterology;  Laterality: N/A;  ? ENTEROSCOPY N/A 01/20/2020  ? Procedure: ENTEROSCOPY;  Surgeon: Rush Landmark Telford Nab., MD;  Location: [facility];  Service: Gastroenterology;  Laterality: N/A;  ? ENTEROSCOPY N/A 09/16/2020  ?  Procedure: ENTEROSCOPY;  Surgeon: Ladene Artist, MD;  Location: [facility];  Service: Endoscopy;  Laterality: N/A;  push enteroscopy  ? ESOPHAGOGASTRODUODENOSCOPY    ? multiple  ? ESOPHAGOGASTRODUODENOSCOPY (EGD) WITH PROPOFOL N/A 04/05/2019  ? Procedure: ESOPHAGOGASTRODUODENOSCOPY (EGD) WITH PROPOFOL;  Surgeon: Thornton Honsinger, MD;  Location: [place];  Service: Gastroenterology;  Laterality: N/A;  ? ESOPHAGOGASTRODUODENOSCOPY (EGD) WITH PROPOFOL N/A 01/18/2020  ? Procedure: ESOPHAGOGASTRODUODENOSCOPY (EGD) WITH PROPOFOL;  Surgeon: Doran Stabler, MD;  Location: [facility] END[facility];  Service: Gastroenterology;  Laterality: N/A;  For placement of video capsule study  ? GIVENS CAPSULE STUDY N/A 01/16/2020  ? Procedure: GIVENS CAPSULE STUDY;  Surgeon: Irving Copas., MD;  Location: [facility];  Service: Gastroenterology;  Laterality: N/A;  ? GIVENS CAPSULE STUDY N/A 01/18/2020  ? Procedure: GIVENS CAPSULE STUDY;  Surgeon: Doran Stabler, MD;  Location: [facility];  Service: Gastroenterology;  Laterality: N/A;  ? HOT HEMOSTASIS N/A 01/20/2020  ? Procedure: HOT HEMOSTASIS (ARGON PLASMA COAGULATION/BICAP);  Surgeon: Irving Copas., MD;  Location: [facility];  Service: Gastroenterology;  Laterality: N/A;  ? HOT HEMOSTASIS N/A 09/16/2020  ? Procedure: HOT HEMOSTASIS (ARGON PLASMA COAGULATION/BICAP);  Surgeon: Ladene Artist, MD;  Location: [facility];  Service: Endoscopy;  Laterality: N/A;  ? INSERTION OF MESH  01/29/2012  ? Procedure: INSERTION OF MESH;  Surgeon: Gwenyth Ober, MD;  Location: [place];  Service: General;  Laterality: N/A;  ? IR ANGIOGRAM SELECTIVE EACH ADDITIONAL VESSEL  04/01/2019  ? IR ANGIOGRAM VISCERAL SELECTIVE  04/01/2019  ? IR ANGIOGRAM VISCERAL SELECTIVE  04/01/2019  ? IR ANGIOGRAM VISCERAL SELECTIVE  04/01/2019  ? IR US GUIDE VASC ACCESS RIGHT  04/01/2019  ? NISSEN FUNDOPLICATION    ? SAVORY DILATION N/A 01/16/2020  ? Procedure: SAVORY DILATION;  Surgeon: Irving Copas., MD;  Location: [facility];  Service: Gastroenterology;  Laterality: N/A;  ? SAVORY DILATION N/A 09/16/2020  ? Procedure: SAVORY DILATION;  Surgeon: Ladene Artist, MD;  Location: [facility];  Service: Endoscopy;  Laterality: N/A;  ? SCLEROTHERAPY  01/20/2020  ? Procedure: SCLEROTHERAPY;  Surgeon: Mansouraty, Telford Nab., MD;  Location: [facility];  Service: Gastroenterology;;  ? SPLENECTOMY, TOTAL    ? nontraumatic rupture  ? SUBMUCOSAL TATTOO INJECTION  01/16/2020  ? Procedure: SUBMUCOSAL TATTOO INJECTION;  Surgeon: Irving Copas., MD;  Location: [facility];  Service: Gastroenterology;;  ? [place]  01/29/2012   ? WITH MESH  ? VENTRAL HERNIA REPAIR  01/29/2012  ? Procedure: HERNIA REPAIR VENTRAL ADULT;  Surgeon: Gwenyth Ober, MD;  Location: [place];  Service: General;  Laterality: N/A;  open recurrent ventral hernia repair with mesh  ? ? ?Prior to Admission medications   ?Medication Sig Start Date End Date Taking? Authorizing Provider  ?acetaminophen (TYLENOL) 325 MG tablet Take 2 tablets (650 mg total) by mouth every 6 (six) hours as needed for mild pain, fever or headache. 03/12/21 03/12/22  Elgergawy, Silver Huguenin, MD  ?albuterol (VENTOLIN HFA) 108 (90 Base) MCG/ACT inhaler Inhale 2 puffs  into the lungs every 6 (six) hours as needed for wheezing or shortness of breath.    [provider]  ?aspirin EC 81 MG tablet Take 1 tablet (81 mg total) by mouth daily. Swallow whole. 08/20/20 08/20/21  Rai, Ripudeep K, MD  ?budesonide (PULMICORT) 0.5 MG/2ML nebulizer solution Take 2 mLs by nebulization daily. 29m by nebulization daily ?And 256mby nebulization daily as needed for shortness of breath/wheezing 09/01/18   [provider]  ?carvedilol (COREG) 3.125 MG tablet Take 1 tablet (3.125 mg total) by mouth 2 (two) times daily with a meal. 03/12/21   Elgergawy, DaSilver HugueninMD  ?cholecalciferol (VITAMIN D) 25 MCG (1000 UNIT) tablet Take 1,000 Units by mouth daily.    [provider]  ?Cyanocobalamin (VITAMIN B-12 PO) Take 1 tablet by mouth daily.    [provider]  ?divalproex (DEPAKOTE SPRINKLE) 125 MG capsule Take 500 mg by mouth at bedtime. 4 caps per night

## 2021-06-23 NOTE — Plan of Care (Signed)
Patient was oriented to room. Call bell and personal belongings were placed within reach. Pt will call when needing assistance to get out of bed. Pt rights and hand booklet provided. Bed alarm set ?

## 2021-06-23 NOTE — ED Notes (Signed)
Spoke with Mrs. Netherton. Gave her update on the patient.  ?

## 2021-06-23 NOTE — ED Provider Notes (Addendum)
[facility]Y DEPT Provider Note: Lowella Dell, MD, FACEP  CSN: [id number] MRN: [id number] ARRIVAL: 06/22/21 at 2320 ROOM: WA19/WA19   CHIEF COMPLAINT  Rectal Bleeding  Level 5 caveat: Aphasia HISTORY OF PRESENT ILLNESS  06/23/21 12:05 AM [name] is a 88 y.o. male who was sent from his assisted living facility for profuse rectal bleeding began about 7 PM.  He had a brief syncopal episode with this lasted about 10 seconds.  He did not injure himself.  He has a history of diverticulitis and diverticular bleed in the past.  He denies any abdominal pain with this.  He was noted to be hypoxic (90% on room air) and was started on supplemental oxygen by nasal cannula.   Past Medical History:  Diagnosis Date   Acute CVA (cerebrovascular accident) (HCC) 07/25/2015   Asthma    Benign essential HTN    Cerebral infarction due to embolism of left middle cerebral artery (HCC) 02/03/2014   Chronic combined systolic and diastolic CHF (congestive heart failure) (HCC) 12/23/2017   Colitis    Colon polyps    Diabetes mellitus type 2, diet-controlled (HCC) 12/22/2013   Diverticulosis    GERD (gastroesophageal reflux disease)    GI bleed    GI bleed    Hemorrhoids    Hiatal hernia    History of esophageal strciture    HLD (hyperlipidemia)    Osteoarthritis    Proctitis    Status post dilation of esophageal narrowing    Stroke [facility])     Past Surgical History:  Procedure Laterality Date   ABDOMINAL HERNIA REPAIR     BIOPSY  04/05/2019   Procedure: BIOPSY;  Surgeon: Tressia Danas, MD;  Location: [facility] ENDOSCOPY;  Service: Gastroenterology;;   COLONOSCOPY     multiple   COLONOSCOPY WITH PROPOFOL N/A 12/12/2016   Procedure: COLONOSCOPY WITH PROPOFOL;  Surgeon: Iva Boop, MD;  Location: [facility] ENDOSCOPY;  Service: Endoscopy;  Laterality: N/A;   COLONOSCOPY WITH PROPOFOL N/A 04/04/2019   Procedure: COLONOSCOPY WITH PROPOFOL;  Surgeon: Rachael Fee, MD;  Location: [facility] ENDOSCOPY;   Service: Endoscopy;  Laterality: N/A;   ENTEROSCOPY N/A 01/16/2020   Procedure: ENTEROSCOPY;  Surgeon: Meridee Score Netty Starring., MD;  Location: [facility] ENDOSCOPY;  Service: Gastroenterology;  Laterality: N/A;   ENTEROSCOPY N/A 01/20/2020   Procedure: ENTEROSCOPY;  Surgeon: Meridee Score Netty Starring., MD;  Location: [facility] ENDOSCOPY;  Service: Gastroenterology;  Laterality: N/A;   ENTEROSCOPY N/A 09/16/2020   Procedure: ENTEROSCOPY;  Surgeon: Meryl Dare, MD;  Location: [facility] ENDOSCOPY;  Service: Endoscopy;  Laterality: N/A;  push enteroscopy   ESOPHAGOGASTRODUODENOSCOPY     multiple   ESOPHAGOGASTRODUODENOSCOPY (EGD) WITH PROPOFOL N/A 04/05/2019   Procedure: ESOPHAGOGASTRODUODENOSCOPY (EGD) WITH PROPOFOL;  Surgeon: Tressia Danas, MD;  Location: [facility] ENDOSCOPY;  Service: Gastroenterology;  Laterality: N/A;   ESOPHAGOGASTRODUODENOSCOPY (EGD) WITH PROPOFOL N/A 01/18/2020   Procedure: ESOPHAGOGASTRODUODENOSCOPY (EGD) WITH PROPOFOL;  Surgeon: Sherrilyn Rist, MD;  Location: [facility] ENDOSCOPY;  Service: Gastroenterology;  Laterality: N/A;  For placement of video capsule study   GIVENS CAPSULE STUDY N/A 01/16/2020   Procedure: GIVENS CAPSULE STUDY;  Surgeon: Lemar Lofty., MD;  Location: [facility] ENDOSCOPY;  Service: Gastroenterology;  Laterality: N/A;   GIVENS CAPSULE STUDY N/A 01/18/2020   Procedure: GIVENS CAPSULE STUDY;  Surgeon: Sherrilyn Rist, MD;  Location: [facility] ENDOSCOPY;  Service: Gastroenterology;  Laterality: N/A;   HOT HEMOSTASIS N/A 01/20/2020   Procedure: HOT HEMOSTASIS (ARGON PLASMA COAGULATION/BICAP);  Surgeon: Lemar Lofty., MD;  Location: [facility] ENDOSCOPY;  Service: Gastroenterology;  Laterality: N/A;   HOT HEMOSTASIS N/A 09/16/2020   Procedure: HOT HEMOSTASIS (ARGON PLASMA COAGULATION/BICAP);  Surgeon: Meryl Dare, MD;  Location: [facility] ENDOSCOPY;  Service: Endoscopy;  Laterality: N/A;   INSERTION OF MESH  01/29/2012   Procedure: INSERTION OF MESH;  Surgeon: Cherylynn Ridges, MD;  Location[facility] OR;  Service: General;  Laterality: N/A;   IR ANGIOGRAM SELECTIVE EACH ADDITIONAL VESSEL  04/01/2019   IR ANGIOGRAM VISCERAL SELECTIVE  04/01/2019   IR ANGIOGRAM VISCERAL SELECTIVE  04/01/2019   IR ANGIOGRAM VISCERAL SELECTIVE  04/01/2019   IR US GUIDE VASC ACCESS RIGHT  04/01/2019   NISSEN FUNDOPLICATION     SAVORY DILATION N/A 01/16/2020   Procedure: SAVORY DILATION;  Surgeon: Lemar Lofty., MD;  Location: [facility] ENDOSCOPY;  Service: Gastroenterology;  Laterality: N/A;   SAVORY DILATION N/A 09/16/2020   Procedure: SAVORY DILATION;  Surgeon: Meryl Dare, MD;  Location: [facility] ENDOSCOPY;  Service: Endoscopy;  Laterality: N/A;   SCLEROTHERAPY  01/20/2020   Procedure: SCLEROTHERAPY;  Surgeon: Mansouraty, Netty Starring., MD;  Location: [facility] ENDOSCOPY;  Service: Gastroenterology;;   SPLENECTOMY, TOTAL     nontraumatic rupture   SUBMUCOSAL TATTOO INJECTION  01/16/2020   Procedure: SUBMUCOSAL TATTOO INJECTION;  Surgeon: Lemar Lofty., MD;  Location: [facility] ENDOSCOPY;  Service: Gastroenterology;;   VENTRAL HERNIA REPAIR  01/29/2012    WITH MESH   VENTRAL HERNIA REPAIR  01/29/2012   Procedure: HERNIA REPAIR VENTRAL ADULT;  Surgeon: Cherylynn Ridges, MD;  Location: [facility] OR;  Service: General;  Laterality: N/A;  open recurrent ventral hernia repair with mesh    Family History  Problem Relation Age of Onset   Alzheimer's disease Mother    Breast cancer Mother    Throat cancer Father    Colon cancer Neg Hx    Colon polyps Neg Hx    Pancreatic cancer Neg Hx    Stomach cancer Neg Hx    Liver disease Neg Hx    Esophageal cancer Neg Hx    Rectal cancer Neg Hx     Social History   Tobacco Use   Smoking status: Never   Smokeless tobacco: Never  Vaping Use   Vaping Use: Never used  Substance Use Topics   Alcohol use: Yes    Alcohol/week: 2.0 standard drinks    Types: 2 Glasses of wine per week    Comment: daily   Drug use: No    Prior to Admission medications   Medication Sig Start  Date End Date Taking? Authorizing Provider  acetaminophen (TYLENOL) 325 MG tablet Take 2 tablets (650 mg total) by mouth every 6 (six) hours as needed for mild pain, fever or headache. 03/12/21 03/12/22  Elgergawy, Leana Roe, MD  albuterol (VENTOLIN HFA) 108 (90 Base) MCG/ACT inhaler Inhale 2 puffs into the lungs every 6 (six) hours as needed for wheezing or shortness of breath.    [provider]  aspirin EC 81 MG tablet Take 1 tablet (81 mg total) by mouth daily. Swallow whole. 08/20/20 08/20/21  Rai, Ripudeep K, MD  budesonide (PULMICORT) 0.5 MG/2ML nebulizer solution Take 2 mLs by nebulization daily. 2ml by nebulization daily And 2ml by nebulization daily as needed for shortness of breath/wheezing 09/01/18   [provider]  carvedilol (COREG) 3.125 MG tablet Take 1 tablet (3.125 mg total) by mouth 2 (two) times daily with a meal. 03/12/21   Elgergawy, Leana Roe, MD  cholecalciferol (VITAMIN D) 25 MCG (  1000 UNIT) tablet Take 1,000 Units by mouth daily.    [provider]  Cyanocobalamin (VITAMIN B-12 PO) Take 1 tablet by mouth daily.    [provider]  divalproex (DEPAKOTE SPRINKLE) 125 MG capsule Take 500 mg by mouth at bedtime. 4 caps per night    [provider]  fluticasone (FLONASE) 50 MCG/ACT nasal spray Place 1 spray into both nostrils daily as needed for allergies or rhinitis.  06/18/14   [provider]  Melatonin 5 MG CAPS Take 5 mg by mouth at bedtime.    [provider]  metFORMIN (GLUCOPHAGE) 500 MG tablet Take 500 mg by mouth daily. 08/23/19   [provider]  Multiple Vitamins-Minerals (ZINC PO) Take 1 tablet by mouth daily.    [provider]  pantoprazole (PROTONIX) 40 MG tablet Take 1 tablet (40 mg total) by mouth daily with breakfast. 02/04/21   Esterwood, Amy S, PA-C  polyvinyl alcohol (LIQUIFILM TEARS) 1.4 % ophthalmic solution Place 1 drop into both eyes as needed for dry eyes.    [provider]   predniSONE (DELTASONE) 10 MG tablet Take 5 mg by mouth daily.  07/11/19   [provider]  rOPINIRole (REQUIP) 4 MG tablet Take 1 tablet (4 mg total) by mouth at bedtime. 05/12/21   Ihor Austin, NP  simvastatin (ZOCOR) 40 MG tablet Take 1 tablet (40 mg total) by mouth daily. Patient taking differently: Take 40 mg by mouth at bedtime. 02/26/16   Amin, Loura Halt, MD  traMADol (ULTRAM) 50 MG tablet Take 1 tablet (50 mg total) by mouth daily as needed for moderate pain. 03/12/21   Elgergawy, Leana Roe, MD  vitamin C (ASCORBIC ACID) 500 MG tablet Take 500 mg by mouth daily.    [provider]    Allergies Tape   REVIEW OF SYSTEMS  Negative except as noted here or in the History of Present Illness.   PHYSICAL EXAMINATION  Initial Vital Signs Blood pressure 124/83, pulse 78, temperature (!) 97.4 F (36.3 C), temperature source Oral, resp. rate (!) 21, SpO2 95 %.  Examination General: Well-developed, well-nourished male in no acute distress; appearance consistent with age of record HENT: normocephalic; atraumatic Eyes: pupils equal, round and reactive to light; extraocular muscles intact; bilateral pseudophakia Neck: supple Heart: regular rate and rhythm Lungs: Distant sounds Abdomen: soft; nondistended; slight left lower quadrant tenderness; bowel sounds present Rectal: Gross blood in rectal vault; briefs and sheets saturated with blood Extremities: No deformity; full range of motion; trace edema of lower leg Neurologic: Awake, alert; expressive aphasia; noted to move all extremities Skin: Pale Psychiatric: Normal mood and affect   RESULTS  Summary of this visit's results, reviewed and interpreted by myself:   EKG Interpretation  Date/Time:  Monday Jun 23 2021 00:39:10 EDT Ventricular Rate:  65 PR Interval:  169 QRS Duration: 108 QT Interval:  455 QTC Calculation: 470 R Axis:   -28 Text Interpretation: Sinus rhythm Borderline left axis deviation Rate is  slower Confirmed by Shailyn Weyandt ([id number]) on 06/23/2021 12:50:10 AM       Laboratory Studies: Results for orders placed or performed during the hospital encounter of 06/22/21 (from the past 24 hour(s))  Comprehensive metabolic panel     Status: Abnormal   Collection Time: 06/22/21 11:39 PM  Result Value Ref Range   Sodium 139 135 - 145 mmol/L   Potassium 4.2 3.5 - 5.1 mmol/L   Chloride 108 98 - 111 mmol/L   CO2 26 22 -  32 mmol/L   Glucose, Bld 189 (H) 70 - 99 mg/dL   BUN 20 8 - 23 mg/dL   Creatinine, Ser 5.28 0.61 - 1.24 mg/dL   Calcium 8.0 (L) 8.9 - 10.3 mg/dL   Total Protein 5.4 (L) 6.5 - 8.1 g/dL   Albumin 3.0 (L) 3.5 - 5.0 g/dL   AST 15 15 - 41 U/L   ALT 12 0 - 44 U/L   Alkaline Phosphatase 43 38 - 126 U/L   Total Bilirubin 0.6 0.3 - 1.2 mg/dL   GFR, Estimated >41 >32 mL/min   Anion gap 5 5 - 15  CBC with Differential     Status: Abnormal   Collection Time: 06/22/21 11:39 PM  Result Value Ref Range   WBC 14.0 (H) 4.0 - 10.5 K/uL   RBC 3.94 (L) 4.22 - 5.81 MIL/uL   Hemoglobin 10.5 (L) 13.0 - 17.0 g/dL   HCT 44.0 (L) 10.2 - 72.5 %   MCV 89.1 80.0 - 100.0 fL   MCH 26.6 26.0 - 34.0 pg   MCHC 29.9 (L) 30.0 - 36.0 g/dL   RDW 36.6 (H) 44.0 - 34.7 %   Platelets 265 150 - 400 K/uL   nRBC 0.0 0.0 - 0.2 %   Neutrophils Relative % 62 %   Neutro Abs 8.9 (H) 1.7 - 7.7 K/uL   Lymphocytes Relative 21 %   Lymphs Abs 2.9 0.7 - 4.0 K/uL   Monocytes Relative 8 %   Monocytes Absolute 1.1 (H) 0.1 - 1.0 K/uL   Eosinophils Relative 7 %   Eosinophils Absolute 0.9 (H) 0.0 - 0.5 K/uL   Basophils Relative 1 %   Basophils Absolute 0.1 0.0 - 0.1 K/uL   Immature Granulocytes 1 %   Abs Immature Granulocytes 0.09 (H) 0.00 - 0.07 K/uL   Acanthocytes PRESENT    Schistocytes PRESENT    Carollee Massed Bodies PRESENT    Polychromasia PRESENT    Target Cells PRESENT    Ovalocytes PRESENT   Protime-INR     Status: None   Collection Time: 06/22/21 11:39 PM  Result Value Ref Range   Prothrombin Time  13.9 11.4 - 15.2 seconds   INR 1.1 0.8 - 1.2  APTT     Status: Abnormal   Collection Time: 06/22/21 11:39 PM  Result Value Ref Range   aPTT 23 (L) 24 - 36 seconds  Type and screen [facility]     Status: None   Collection Time: 06/22/21 11:57 PM  Result Value Ref Range   ABO/RH(D) O POS    Antibody Screen NEG    Sample Expiration      06/25/2021,2359 Performed at [facility], [address], [place], [place] [address]   Hemoglobin and hematocrit, blood     Status: Abnormal   Collection Time: 06/23/21  1:23 AM  Result Value Ref Range   Hemoglobin 9.5 (L) 13.0 - 17.0 g/dL   HCT 63.8 (L) 75.6 - 43.3 %  CBG monitoring, ED     Status: Abnormal   Collection Time: 06/23/21  4:09 AM  Result Value Ref Range   Glucose-Capillary 161 (H) 70 - 99 mg/dL  CBG monitoring, ED     Status: Abnormal   Collection Time: 06/23/21  4:11 AM  Result Value Ref Range   Glucose-Capillary 151 (H) 70 - 99 mg/dL  CBC with Differential/Platelet     Status: Abnormal   Collection Time: 06/23/21  4:16 AM  Result Value Ref Range  WBC 14.5 (H) 4.0 - 10.5 K/uL   RBC 3.42 (L) 4.22 - 5.81 MIL/uL   Hemoglobin 8.9 (L) 13.0 - 17.0 g/dL   HCT 40.9 (L) 81.1 - 91.4 %   MCV 89.2 80.0 - 100.0 fL   MCH 26.0 26.0 - 34.0 pg   MCHC 29.2 (L) 30.0 - 36.0 g/dL   RDW 78.2 (H) 95.6 - 21.3 %   Platelets 242 150 - 400 K/uL   nRBC 0.0 0.0 - 0.2 %   Neutrophils Relative % 76 %   Neutro Abs 11.1 (H) 1.7 - 7.7 K/uL   Lymphocytes Relative 15 %   Lymphs Abs 2.2 0.7 - 4.0 K/uL   Monocytes Relative 7 %   Monocytes Absolute 1.0 0.1 - 1.0 K/uL   Eosinophils Relative 1 %   Eosinophils Absolute 0.2 0.0 - 0.5 K/uL   Basophils Relative 1 %   Basophils Absolute 0.1 0.0 - 0.1 K/uL   Immature Granulocytes 0 %   Abs Immature Granulocytes 0.06 0.00 - 0.07 K/uL   Acanthocytes PRESENT    Carollee Massed Bodies PRESENT    Target Cells PRESENT    Ovalocytes PRESENT   Comprehensive metabolic panel      Status: Abnormal   Collection Time: 06/23/21  4:16 AM  Result Value Ref Range   Sodium 139 135 - 145 mmol/L   Potassium 4.3 3.5 - 5.1 mmol/L   Chloride 106 98 - 111 mmol/L   CO2 28 22 - 32 mmol/L   Glucose, Bld 151 (H) 70 - 99 mg/dL   BUN 23 8 - 23 mg/dL   Creatinine, Ser 0.86 0.61 - 1.24 mg/dL   Calcium 8.3 (L) 8.9 - 10.3 mg/dL   Total Protein 5.0 (L) 6.5 - 8.1 g/dL   Albumin 2.8 (L) 3.5 - 5.0 g/dL   AST 14 (L) 15 - 41 U/L   ALT 11 0 - 44 U/L   Alkaline Phosphatase 39 38 - 126 U/L   Total Bilirubin 0.5 0.3 - 1.2 mg/dL   GFR, Estimated >57 >84 mL/min   Anion gap 5 5 - 15  Magnesium     Status: None   Collection Time: 06/23/21  4:16 AM  Result Value Ref Range   Magnesium 2.1 1.7 - 2.4 mg/dL  Phosphorus     Status: None   Collection Time: 06/23/21  4:16 AM  Result Value Ref Range   Phosphorus 3.3 2.5 - 4.6 mg/dL   Imaging Studies: DG Chest Port 1 View  Result Date: 06/23/2021 CLINICAL DATA:  Hypoxia EXAM: PORTABLE CHEST 1 VIEW COMPARISON:  03/03/2021 FINDINGS: Heart and mediastinal contours are within normal limits. No focal opacities or effusions. No acute bony abnormality. Aortic atherosclerosis. IMPRESSION: No active disease. Electronically Signed   By: Charlett Nose M.D.   On: 06/23/2021 00:21    ED COURSE and MDM  Nursing notes, initial and subsequent vitals signs, including pulse oximetry, reviewed and interpreted by myself.  Vitals:   06/23/21 0330 06/23/21 0400 06/23/21 0430 06/23/21 0500  BP: 103/81 105/60 114/63 105/71  Pulse: 77 78 81 84  Resp: 18 14 20  (!) 21  Temp:      TempSrc:      SpO2: 95% 100% 97% 91%   Medications  pantoprazole (PROTONIX) injection 40 mg (40 mg Intravenous Given 06/23/21 0141)  simvastatin (ZOCOR) tablet 40 mg (40 mg Oral Given 06/23/21 0248)  lactated ringers infusion ( Intravenous New Bag/Given 06/23/21 0415)  insulin aspart (novoLOG) injection 0-9 Units (0 Units  Subcutaneous Not Given 06/23/21 0415)  acetaminophen (TYLENOL) tablet 650 mg  (has no administration in time range)  prochlorperazine (COMPAZINE) injection 10 mg (has no administration in time range)  melatonin tablet 3 mg (has no administration in time range)  lactated ringers bolus 1,000 mL (0 mLs Intravenous Stopped 06/23/21 0110)  calcium gluconate 1 g/ 50 mL sodium chloride IVPB (0 mg Intravenous Stopped 06/23/21 0357)   Patient given 1 L bolus of lactated Ringer's due to soft blood pressure in the setting of significant lower GI bleed.  Patient is status calcium also noted to be low end 1 g of calcium gluconate was ordered.  Bleeding is likely diverticular as the patient has a history of the same.  1:15 AM Dr. Margo Aye to admit to hospitalist service.  6:34 AM Repeat hemoglobin now 8.9.  Part of this is likely to hemodilution as a result of hydration hydration and part may be due to continued bleeding.   PROCEDURES  Procedures   ED DIAGNOSES     ICD-10-CM   1. Lower GI bleed  K92.2     2. Anemia due to acute blood loss  D62     3. Hypocalcemia  E83.51          Cortney Mckinney, Jonny Ruiz, MD 06/23/21 [id number]    Paula Libra, MD 06/23/21 [id number]

## 2021-06-23 NOTE — TOC Initial Note (Signed)
Transition of Care (TOC) - Initial/Assessment Note  ? ? ?Patient Details  ?Name: [name] ?MRN: [id number] ?Date of Birth: Jul 16, 1931 ? ?Transition of Care [facility]) CM/SW Contact:    ?Dessa Phi, RN ?Phone Number: ?06/23/2021, 10:27 AM ? ?Clinical Narrative: spoke to spouse about d/c plans-return back to [facility] living[facility].Has own transporation.Await PT eval.                ? ? ?Expected Discharge Plan: Home/Self Care ?Barriers to Discharge: Continued Medical Work up ? ? ?Patient Goals and CMS Choice ?Patient states their goals for this hospitalization and ongoing recovery are:: [facility] to return ?CMS Medicare.gov Compare Post Acute Care list provided to:: Patient Represenative (must comment) [name] spouse) ?Choice offered to / list presented to : Spouse ? ?Expected Discharge Plan and Services ?Expected Discharge Plan: Home/Self Care ?  ?Discharge Planning Services: CM Consult ?Post Acute Care Choice: Resumption of Svcs/PTA Provider ?Living arrangements for the past 2 months: [place] ?                ?  ?  ?  ?  ?  ?  ?  ?  ?  ?  ? ?Prior Living Arrangements/Services ?Living arrangements for the past 2 months: [place] ?Lives with:: Spouse ?  ?Do you feel safe going back to the place where you live?: Yes      ?Need for Family Participation in Patient Care: Yes (Comment) ?Care giver support system in place?: Yes (comment) ?Current home services: DME (w/c;private duty aide) ?Criminal Activity/Legal Involvement Pertinent to Current Situation/Hospitalization: No - Comment as needed ? ?Activities of Daily Living ?  ?  ? ?Permission Sought/Granted ?Permission sought to share information with : Case Manager ?Permission granted to share information with : Yes, Verbal Permission Granted ? Share Information with NAME: Case manager ?   ?   ?   ? ?Emotional Assessment ?Appearance:: Appears stated age ?Attitude/Demeanor/Rapport: Gracious ?Affect  (typically observed): Accepting ?Orientation: : Oriented to Self, Oriented to Place, Oriented to  Time, Oriented to Situation ?Alcohol / Substance Use: Not Applicable ?Psych Involvement: No (comment) ? ?Admission diagnosis:  Hypocalcemia [E83.51] ?GI bleeding [K92.2] ?Anemia due to acute blood loss [D62] ?Lower GI bleed [K92.2] ?Patient Active Problem List  ? Diagnosis Date Noted  ? GI bleeding 06/23/2021  ? Aphasia 03/10/2021  ? Expressive aphasia 03/08/2021  ? T12 compression fracture, initial encounter ([place]) 03/08/2021  ? Gastric AVM   ? Dysphagia   ? AVM (arteriovenous malformation) of small bowel, acquired with hemorrhage   ? Acute GI bleeding 01/06/2020  ? Protein-calorie malnutrition, severe ([place]) 04/01/2019  ? DNR (do not resuscitate) 04/01/2019  ? Diverticulosis large intestine w/o perforation or abscess w/bleeding   ? GERD (gastroesophageal reflux disease) 04/21/2018  ? Diverticulosis   ? Acute blood loss anemia 12/24/2017  ? Syncope due to orthostatic hypotension 12/23/2017  ? Chronic combined systolic and diastolic CHF (congestive heart failure) ([place]) 12/23/2017  ? Postural dizziness with presyncope 10/25/2016  ? GI bleed 10/25/2016  ? BPPV (benign paroxysmal positional vertigo) 10/25/2016  ? Lower GI bleed 10/24/2016  ? Syncope 10/24/2016  ? TIA (transient ischemic attack) 07/22/2016  ? Benign essential HTN   ? PAF (paroxysmal atrial fibrillation) ([place])   ? Stroke-like symptom 02/22/2016  ? Leukocytosis 02/22/2016  ? Acute lower GI bleeding 02/01/2016  ? History of lower GI bleeding Dec 2017 02/01/2016  ? Chronic anticoagulation 2/2 history of CVA 01/31/2016  ? History of CVA (cerebrovascular  accident) 01/31/2016  ? Sinusitis, chronic 12/12/2015  ? Cough variant asthma 10/31/2015  ? Insomnia 07/25/2015  ? HLD (hyperlipidemia) 02/03/2014  ? Ulnar neuropathy at elbow of right upper extremity 02/03/2014  ? Cervical radiculopathy 02/03/2014  ? Seizures ([place])   ? Diabetes mellitus type II, non insulin  dependent ([place]) 12/22/2013  ? Recurrent ventral hernia 12/15/2011  ? History of esophageal strciture   ? Diverticulosis of colon with hemorrhage 06/24/2007  ? ?PCP:  Seward Carol, MD ?Pharmacy:   ?[facility] [place], [place] - [address] ?[address] ?[place] [address] ?Phone: [phone] Fax: [phone] ? ? ? ? ?Social Determinants of Health (SDOH) Interventions ?  ? ?Readmission Risk Interventions ?   ? View : No data to display.  ?  ?  ?  ? ? ? ?

## 2021-06-24 DIAGNOSIS — Z7189 Other specified counseling: Secondary | ICD-10-CM

## 2021-06-24 DIAGNOSIS — Z515 Encounter for palliative care: Secondary | ICD-10-CM

## 2021-06-24 LAB — CBC
HCT: 25.1 % — ABNORMAL LOW (ref 39.0–52.0)
Hemoglobin: 7.7 g/dL — ABNORMAL LOW (ref 13.0–17.0)
MCH: 27.4 pg (ref 26.0–34.0)
MCHC: 30.7 g/dL (ref 30.0–36.0)
MCV: 89.3 fL (ref 80.0–100.0)
Platelets: 230 10*3/uL (ref 150–400)
RBC: 2.81 MIL/uL — ABNORMAL LOW (ref 4.22–5.81)
RDW: 27.9 % — ABNORMAL HIGH (ref 11.5–15.5)
WBC: 12 10*3/uL — ABNORMAL HIGH (ref 4.0–10.5)
nRBC: 0 % (ref 0.0–0.2)

## 2021-06-24 LAB — HEMOGLOBIN AND HEMATOCRIT, BLOOD
HCT: 28.3 % — ABNORMAL LOW (ref 39.0–52.0)
HCT: 25.4 % — ABNORMAL LOW (ref 39.0–52.0)
Hemoglobin: 8.6 g/dL — ABNORMAL LOW (ref 13.0–17.0)
Hemoglobin: 7.6 g/dL — ABNORMAL LOW (ref 13.0–17.0)

## 2021-06-24 LAB — GLUCOSE, CAPILLARY
Glucose-Capillary: 101 mg/dL — ABNORMAL HIGH (ref 70–99)
Glucose-Capillary: 102 mg/dL — ABNORMAL HIGH (ref 70–99)
Glucose-Capillary: 110 mg/dL — ABNORMAL HIGH (ref 70–99)
Glucose-Capillary: 124 mg/dL — ABNORMAL HIGH (ref 70–99)
Glucose-Capillary: 142 mg/dL — ABNORMAL HIGH (ref 70–99)
Glucose-Capillary: 75 mg/dL (ref 70–99)

## 2021-06-24 MED ORDER — DIVALPROEX SODIUM 125 MG PO CSDR
500.0000 mg | DELAYED_RELEASE_CAPSULE | Freq: Every day | ORAL | Status: DC
  Administered 2021-06-24: 500 mg via ORAL
  Filled 2021-06-24: qty 4

## 2021-06-24 MED ORDER — FLUTICASONE PROPIONATE 50 MCG/ACT NA SUSP: 1.0000 | Freq: Every day | NASAL | Status: DC | PRN

## 2021-06-24 MED ORDER — BUDESONIDE 0.5 MG/2ML IN SUSP: 0.5000 mg | Freq: Every day | RESPIRATORY_TRACT | Status: DC | PRN

## 2021-06-24 MED ORDER — ROPINIROLE HCL 1 MG PO TABS
4.0000 mg | ORAL_TABLET | Freq: Every day | ORAL | Status: DC
  Administered 2021-06-24: 4 mg via ORAL
  Filled 2021-06-24: qty 4

## 2021-06-24 MED ORDER — FERROUS SULFATE 325 (65 FE) MG PO TABS
325.0000 mg | ORAL_TABLET | Freq: Two times a day (BID) | ORAL | Status: DC
  Administered 2021-06-24 – 2021-06-25 (×2): 325 mg via ORAL
  Filled 2021-06-24 (×2): qty 1

## 2021-06-24 MED ORDER — PREDNISONE 5 MG PO TABS
5.0000 mg | ORAL_TABLET | Freq: Every day | ORAL | Status: DC
  Administered 2021-06-24 – 2021-06-25 (×2): 5 mg via ORAL
  Filled 2021-06-24 (×2): qty 1

## 2021-06-24 MED ORDER — ALBUTEROL SULFATE (2.5 MG/3ML) 0.083% IN NEBU: 2.5000 mg | INHALATION_SOLUTION | Freq: Four times a day (QID) | RESPIRATORY_TRACT | Status: DC | PRN

## 2021-06-24 MED ORDER — TRAMADOL HCL 50 MG PO TABS: 50.0000 mg | ORAL_TABLET | Freq: Every day | ORAL | Status: DC | PRN

## 2021-06-24 MED ORDER — MELATONIN 5 MG PO TABS
10.0000 mg | ORAL_TABLET | Freq: Every day | ORAL | Status: DC
  Administered 2021-06-24: 10 mg via ORAL
  Filled 2021-06-24: qty 2

## 2021-06-24 MED ORDER — BUDESONIDE 0.5 MG/2ML IN SUSP
2.0000 mL | Freq: Every day | RESPIRATORY_TRACT | Status: DC
  Administered 2021-06-25: 0.5 mg via RESPIRATORY_TRACT
  Filled 2021-06-24 (×2): qty 2

## 2021-06-24 MED ORDER — CARVEDILOL 3.125 MG PO TABS
3.1250 mg | ORAL_TABLET | Freq: Two times a day (BID) | ORAL | Status: DC
  Administered 2021-06-24 – 2021-06-25 (×2): 3.125 mg via ORAL
  Filled 2021-06-24 (×2): qty 1

## 2021-06-24 NOTE — Consult Note (Signed)
? ?                                                                                ?Consultation Note ?Date: 06/24/2021  ? ?Patient Name: [name]  ?DOB: 1931-05-06  MRN: [id number]  Age / Sex: 88 y.o., male  ?PCP: Seward Carol, MD ?Referring Physician: Kayleen Memos, DO ? ?Reason for Consultation: Establishing goals of care ? ?HPI/Patient Profile: 88 y.o. male  with past medical history of diverticular GI bleed, gastric AVM on daily PPI, multifactorial dysphagia, esophageal ring and focal stricture s/p dilated 2009, paroxysmal Afib not on anticoagulation, CVA on aspirin, CHF, HTN, cough variant asthma, ruptured spleen s/p splenectomy 2009 admitted on 06/22/2021 with bright red blood from rectum with brief syncopal episode.  ? ?Clinical Assessment and Goals of Care: ?I met today with [name] along with his wife and caregiver at bedside. I have reviewed records and past hospitalizations. [name] is able to express past issues with bleeding episodes as well as fall and TIA. He has had "bumps in the road" but overall with good functional status and living with his wife in independent living at [facility] (for 6 years now). They explain to me that they have had a very full and blessed life and while they are happy for any time they can spend together moving forward they value quality time together. [name] wishes to remain in independent living with his wife of 69 years as long as possible. They have 2 adult children together. They have a caregiver that helps [name] remain in independent living. They have good understanding of his health situation and the limitation of medicine. They tell me that at this stage in their life they are not interested in aggressive or invasive treatments. We discussed MOST form and how this could be helpful for clarifying their wishes moving forward.  ? ?We discussed and reviewed MOST form. MOST completed: DNR, limited interventions, determine benefits/limitations of antibiotics  at time of infection, IV fluids for trial period, NO feeding tube. Also added that he does not desire further endoscopy/colonoscopy intervention.  ? ?Hopeful for return to his home and be followed by palliative outpatient services to help them determine goals moving forward and know when it is time for hospice to come in. They share that they find peace and strength from their faith that helps them through these situations and discussions.  ? ?All questions/concerns addressed. Emotional support provided.  ? ?Primary Decision Maker ?PATIENT ?  ? ?SUMMARY OF RECOMMENDATIONS   ?- DNR  ?- MOST completed (see above) ? ?Code Status/Advance Care Planning: ?DNR ? ? ?Symptom Management:  ?Per attending, GI ? ?Palliative Prophylaxis:  ?Aspiration, Bowel Regimen, Delirium Protocol, Frequent Pain Assessment, and Turn Reposition ? ?Prognosis:  ?Difficult to determine at this time. Not yet eligible for hospice at home.  ? ?Discharge Planning: To Be Determined  ? ?  ? ?Primary Diagnoses: ?Present on Admission: ? GI bleeding ? ? ?I have reviewed the medical record, interviewed the patient and family, and examined the patient. The following aspects are pertinent. ? ?Past Medical History:  ?Diagnosis Date  ? Acute CVA (cerebrovascular accident) ([place]) 07/25/2015  ?  Asthma   ? Benign essential HTN   ? Cerebral infarction due to embolism of left middle cerebral artery ([place]) 02/03/2014  ? Chronic combined systolic and diastolic CHF (congestive heart failure) ([place]) 12/23/2017  ? Colitis   ? Colon polyps   ? Diabetes mellitus type 2, diet-controlled ([place]) 12/22/2013  ? Diverticulosis   ? GERD (gastroesophageal reflux disease)   ? GI bleed   ? GI bleed   ? Hemorrhoids   ? Hiatal hernia   ? History of esophageal strciture   ? HLD (hyperlipidemia)   ? Osteoarthritis   ? Proctitis   ? Status post dilation of esophageal narrowing   ? Stroke [facility])   ? ?Social History  ? ?Socioeconomic History  ? Marital status: Married  ?  Spouse name: [name]  ?  Number of children: 2  ? Years of education: Bachelor's  ? Highest education level: Not on file  ?Occupational History  ? Occupation: retired  ?Tobacco Use  ? Smoking status: Never  ? Smokeless tobacco: Never  ?Vaping Use  ? Vaping Use: Never used  ?Substance and Sexual Activity  ? Alcohol use: Yes  ?  Alcohol/week: 2.0 standard drinks  ?  Types: 2 Glasses of wine per week  ?  Comment: daily  ? Drug use: No  ? Sexual activity: Not Currently  ?Other Topics Concern  ? Not on file  ?Social History Narrative  ? Patient is married and has 2 children.  ? Patient is right handed.  ? Patient has Bachelor's degree.  ? Patient drinks 2 cups daily.  ? ?Social Determinants of Health  ? ?Financial Resource Strain: Not on file  ?Food Insecurity: Not on file  ?Transportation Needs: Not on file  ?Physical Activity: Not on file  ?Stress: Not on file  ?Social Connections: Not on file  ? ?Family History  ?Problem Relation Age of Onset  ? Alzheimer's disease Mother   ? Breast cancer Mother   ? Throat cancer Father   ? Colon cancer Neg Hx   ? Colon polyps Neg Hx   ? Pancreatic cancer Neg Hx   ? Stomach cancer Neg Hx   ? Liver disease Neg Hx   ? Esophageal cancer Neg Hx   ? Rectal cancer Neg Hx   ? ?Scheduled Meds: ? insulin aspart  0-9 Units Subcutaneous Q4H  ? pantoprazole (PROTONIX) IV  40 mg Intravenous BID  ? simvastatin  40 mg Oral QHS  ? ?Continuous Infusions: ? lactated ringers 50 mL/hr at 06/23/21 1543  ? ?PRN Meds:.acetaminophen, melatonin, prochlorperazine ?Allergies  ?Allergen Reactions  ? Tape Other (See Comments)  ?  SKIN IS SENSITIVE; PLEASE USE PAPER TAPE; SKIN BRUISES AND TEARS EASILY!!  ? ?Review of Systems  ?Constitutional:  Negative for activity change and appetite change.  ?Respiratory:  Negative for shortness of breath.   ?Gastrointestinal:  Positive for blood in stool. Negative for constipation and diarrhea.  ? ?Physical Exam ?Vitals and nursing note reviewed.  ?Constitutional:   ?   General: He is not in acute  distress. ?   Appearance: He is ill-appearing.  ?Cardiovascular:  ?   Rate and Rhythm: Normal rate.  ?Pulmonary:  ?   Effort: No tachypnea, accessory muscle usage or respiratory distress.  ?Abdominal:  ?   Palpations: Abdomen is soft.  ?Neurological:  ?   Mental Status: He is alert and oriented to person, place, and time.  ? ? ?Vital Signs: BP 133/79 (BP Location: Right Arm)   Pulse 96  Temp 98.7 ?F (37.1 ?C) (Oral)   Resp 16   Ht '5\' 10"'  (1.778 m)   Wt 68 kg   SpO2 99%   BMI 21.51 kg/m?  ?Pain Scale: 0-10 ?  ?Pain Score: 0-No pain ? ? ?SpO2: SpO2: 99 % ?O2 Device:SpO2: 99 % ?O2 Flow Rate: .  ? ?IO: Intake/output summary:  ?Intake/Output Summary (Last 24 hours) at 06/24/2021 1017 ?Last data filed at 06/24/2021 0816 ?Gross per 24 hour  ?Intake 1130.51 ml  ?Output 1500 ml  ?Net -369.49 ml  ? ? ?LBM: Last BM Date : 06/23/21 ?Baseline Weight: Weight: 68 kg ?Most recent weight: Weight: 68 kg     ?Palliative Assessment/Data: ? ? ? ? ? ?Time Total: 80 min ? ?Greater than 50%  of this time was spent counseling and coordinating care related to the above assessment and plan. ? ?Signed by: ?Vinie Sill, NP ?Palliative Medicine Team ?Pager # [phone] (M-F 8a-5p) ?Team Phone # [phone] (Nights/Weekends) ?  ? ? ? ? ? ? ? ? ? ? ? ? ?  ?

## 2021-06-24 NOTE — Progress Notes (Signed)
?Progress Note ? ? ?PatientMARLAN [name] PIR:[id number] DOB: 1931-10-09 DOA: 06/22/2021     1 ?DOS: the patient was seen and examined on 06/24/2021 ?  ?Brief hospital course: ?88 y.o. male with medical history significant for previous GI bleed presumed diverticular, multifactorial dysphagia, paroxysmal A-fib not anticoagulated, gastric AVM on daily PPI, post CVA on aspirin, esophageal ring, focal stricture dilated in 2009, ruptured spleen status with splenectomy in 2009, presented to [facility] ED from assisted living facility due to bright red blood per rectum ? ?ED Course: Tmax 97.4.  BP 110/75, pulse 66, respiratory rate 19, O2 saturation 99% on 2 L nasal cannula.  Lab studies remarkable for WBC 14.0, hemoglobin 9.5, MCV 89, platelet count 265.  Serum glucose 189, calcium 8.0, albumin 3.0, corrected calcium for albumin 8.8. ? ?Assessment and Plan: ?No notes have been filed under this hospital service. ?Service: Hospitalist ? ?Hematochezia ?History of recurrent GI bleed, presumed diverticular ?History of gastritis, active bleeding AVM seen on capsule endoscopy in December 2021, recurrent bleed July/August 2022 small bowel enteroscopy found a single gastric AVM that was not bleeding but treated with APC. ?Initially started on IV Protonix 40 mg twice daily ?Appreciate input by GI. Patient with long history of GI bleeding and is well know to GI service. No plans for further testing including endoscopy ?Appreciate input by Palliative Care. DNR confirmed with MOST completed. Not yet eligible for home hospice ?  ?Acute blood loss anemia in the setting of hematochezia ?Baseline hemoglobin appears to be 12 K ?Hgb down to 7.7 with repeat of 8.6 ?Repeat cbc in AM ?  ?History of CVA on aspirin ?On zocor ?OK to resume ASA at discharge, per GI ?  ?Paroxysmal A-fib not anticoagulated ?Rate is currently controlled ?Home Coreg held due to soft BPs to avoid hypotension. ?BP stable. Will resume home meds ?  ?Brief syncope secondary to  hypotension ?Syncopal episode lasting about 10 seconds per EMS, hypotensive afterwards. ? Received IV fluid bolus in the ED 1 L LR. ?On gentle IVF hydration. ?PT/OT were consulted with recs for SNF. Family had declined SNF, now plan for home with [facility] on d/c ?  ?Prediabetes with hyperglycemia ?Last hemoglobin A1c 6.3 on 03/09/2021 ?Cont on SSI ?Diet resumed per GI ?  ?Mild hypocalcemia ?Calcium correction for albumin 8.8 ?  ?Leukocytosis, suspect reactive in the setting of GI bleed. ?Afebrile ?WBC down to 12k ?Repeat cbc in AM ?  ? ?  ? ?Subjective: Without complaints this AM ? ?Physical Exam: ?Vitals:  ? 06/23/21 2008 06/24/21 0419 06/24/21 1439 06/24/21 1441  ?BP: 119/64 133/79  133/78  ?Pulse: 86 96 92 90  ?Resp: '16  16 17  '$ ?Temp: 98.3 ?F (36.8 ?C) 98.7 ?F (37.1 ?C)  98 ?F (36.7 ?C)  ?TempSrc: Oral Oral    ?SpO2: 98% 99% 99% 100%  ?Weight:      ?Height:      ? ?General exam: Awake, laying in bed, in nad ?Respiratory system: Normal respiratory effort, no wheezing ?Cardiovascular system: regular rate, s1, s2 ?Gastrointestinal system: Soft, nondistended, positive BS ?Central nervous system: CN2-12 grossly intact, strength intact ?Extremities: Perfused, no clubbing ?Skin: Normal skin turgor, no notable skin lesions seen ?Psychiatry: Mood normal // no visual hallucinations  ? ?Data Reviewed: ? ?Labs reviewed, hgb 8.6 ? ?Family Communication: Pt in room, family currently at bedside ? ?Disposition: ?Status is: Inpatient ?Remains inpatient appropriate because: Severity of illness  ? Planned Discharge Destination: Home ? ? ? ?Author: ?Marylu Lund, MD ?06/24/2021  3:42 PM ? ?For on call review [email or web address].  ?

## 2021-06-24 NOTE — Evaluation (Signed)
Occupational Therapy Evaluation ?Patient Details ?Name: [name] ?MRN: [id number] ?DOB: Jan 19, 1932 ?Today's Date: 06/24/2021 ? ? ?History of Present Illness Patient is 88 y.o. male presented to [facility] ED from assisted living facility due to bright red blood per rectum. PMH significant for previous GI bleed presumed diverticular, multifactorial dysphagia, paroxysmal A-fib not anticoagulated, gastric AVM on daily PPI, post CVA on aspirin, esophageal ring, focal stricture dilated in 2009, ruptured spleen status with splenectomy in 2009.  ? ?Clinical Impression ?  ?Patient is a 88 year old male who was admitted for above. Patient was living at home with wife and caregiver support 5x a week for a few hours. Patient currently is +2 for transfers with RW with max A for LB dressing tasks. Patient would continue to benefit from skilled OT services at this time while admitted and after d/c to address noted deficits in order to improve overall safety and independence in ADLs.  ?  ?   ? ?Recommendations for follow up therapy are one component of a multi-disciplinary discharge planning process, led by the attending physician.  Recommendations may be updated based on patient status, additional functional criteria and insurance authorization.  ? ?Follow Up Recommendations ? Skilled nursing-short term rehab (<3 hours/day)  ?  ?Assistance Recommended at Discharge Frequent or constant Supervision/Assistance  ?Patient can return home with the following A little help with walking and/or transfers;A little help with bathing/dressing/bathroom;Assistance with cooking/housework;Direct supervision/assist for financial management;Assist for transportation;Help with stairs or ramp for entrance;Direct supervision/assist for medications management ? ?  ?Functional Status Assessment ? Patient has had a recent decline in their functional status and demonstrates the ability to make significant improvements in function in a reasonable and predictable  amount of time.  ?Equipment Recommendations ? None recommended by OT  ?  ?Recommendations for Other Services   ? ? ?  ?Precautions / Restrictions Precautions ?Precautions: Fall ?Precaution Comments: wears braces on LLE at baseline ?Restrictions ?Weight Bearing Restrictions: No  ? ?  ? ?Mobility Bed Mobility ?Overal bed mobility: Needs Assistance ?Bed Mobility: Supine to Sit ?  ?  ?Supine to sit: Min guard, HOB elevated ?  ?  ?General bed mobility comments: pt taking extra time to initiate and sequence moving supine>sit EOB. ?  ? ?Transfers ?  ?  ?  ?  ?  ?  ?  ?  ?  ?  ?  ? ?  ?Balance Overall balance assessment: Needs assistance ?Sitting-balance support: Feet supported, Bilateral upper extremity supported ?Sitting balance-Leahy Scale: Fair ?Sitting balance - Comments: posture forward flexed at times ?  ?Standing balance support: Reliant on assistive device for balance, During functional activity, Bilateral upper extremity supported ?Standing balance-Leahy Scale: Poor ?  ?  ?  ?  ?  ?  ?  ?  ?  ?  ?  ?  ?   ? ?ADL either performed or assessed with clinical judgement  ? ?ADL Overall ADL's : Needs assistance/impaired ?Eating/Feeding: Supervision/ safety;Sitting ?  ?Grooming: Wash/dry face;Sitting;Oral care;Minimal assistance ?Grooming Details (indicate cue type and reason): patient was noted to attempt to brush teeth with no toothpaste with toothpaste in LUE. patient needed cues to stop and sequence task. ?Upper Body Bathing: Minimal assistance;Sitting ?  ?Lower Body Bathing: Maximal assistance;Sit to/from stand;Sitting/lateral leans ?  ?Upper Body Dressing : Minimal assistance;Sitting ?  ?Lower Body Dressing: Sit to/from stand;Maximal assistance;Sitting/lateral leans ?  ?Toilet Transfer: Minimal assistance;+2 for safety/equipment;+2 for physical assistance ?Toilet Transfer Details (indicate cue type and reason): with  LLE noted to hyperextend during movement with RW. patient needed +2 for safety. ?Toileting- Clothing  Manipulation and Hygiene: Maximal assistance;Sit to/from stand ?  ?  ?  ?  ?   ? ? ? ?Vision Baseline Vision/History: 1 Wears glasses ?Patient Visual Report: No change from baseline ?   ?   ?Perception   ?  ?Praxis   ?  ? ?Pertinent Vitals/Pain Pain Assessment ?Pain Assessment: Faces ?Faces Pain Scale: Hurts a little bit ?Pain Location: Lt knee with gait ?Pain Descriptors / Indicators: Aching ?Pain Intervention(s): Limited activity within patient's tolerance, Monitored during session, Repositioned  ? ? ? ?Hand Dominance Right ?  ?Extremity/Trunk Assessment Upper Extremity Assessment ?Upper Extremity Assessment: LUE deficits/detail ?LUE Deficits / Details: FF less than 25 degrees with patient attempting to compensate with abduction which patient is able to complete up to 90 degrees. patient noted to have stiffness in UE with grip strength normal ?  ?Lower Extremity Assessment ?Lower Extremity Assessment: Defer to PT evaluation ?RLE Deficits / Details: 4/5 or greater for MMT with hip fexion, knee ext/flex, ankle dorsifelxion ?RLE Sensation: WNL ?RLE Coordination: WNL ?LLE Deficits / Details: 3+ for hip flexion, knee ext/flex, and 4-/5 for dorsiflexion ?LLE Sensation: WNL ?LLE Coordination: WNL ?  ?Cervical / Trunk Assessment ?Cervical / Trunk Assessment: Normal ?  ?Communication Communication ?Communication: No difficulties ?  ?Cognition Arousal/Alertness: Awake/alert ?Behavior During Therapy: [facility] for tasks assessed/performed ?Overall Cognitive Status: Within Functional Limits for tasks assessed ?  ?  ?  ?  ?  ?  ?  ?  ?  ?  ?  ?  ?  ?  ?  ?  ?  ?  ?  ?General Comments    ? ?  ?Exercises   ?  ?Shoulder Instructions    ? ? ?Home Living Family/patient expects to be discharged to:: Assisted living ?  ?  ?  ?  ?  ?  ?  ?  ?  ?  ?  ?  ?  ?  ?Home Equipment: Shower seat;Grab bars - tub/shower;Grab bars - toilet;Rollator (4 wheels);Wheelchair - power;Other (comment);Hand held shower head;Adaptive equipment ?Adaptive  Equipment: Reacher;Long-handled shoe horn ?Additional Comments: pt has a home aid for 5 hours in the morning 5x/week. pt reports he and his wife get by int eh evening and on the weekends. ?  ? ?  ?Prior Functioning/Environment Prior Level of Function : History of Falls (last six months) ?  ?  ?  ?  ?  ?  ?Mobility Comments: ~3 falls in latst 6 months per pt report. pt typically uses rollator in home and power wheelchair in community. recently is relying on power wheelchair for all mobility. ?ADLs Comments: Pt able to perform all ADLs majority of time, with wife or home aid assisting as needed. Wife puts clothes out for him, but otherwise he dresses himself. Was previously donning his AFO and knee brace mod I, has assist from aid now. ?  ? ?  ?  ?OT Problem List: Decreased activity tolerance;Impaired balance (sitting and/or standing);Decreased safety awareness;Decreased knowledge of precautions;Decreased knowledge of use of DME or AE ?  ?   ?OT Treatment/Interventions: Self-care/ADL training;Therapeutic exercise;Neuromuscular education;Energy conservation;DME and/or AE instruction;Therapeutic activities;Balance training;Patient/family education  ?  ?OT Goals(Current goals can be found in the care plan section) Acute Rehab OT Goals ?Patient Stated Goal: to get back home with wife at [place] ?OT Goal Formulation: With patient ?Time For Goal Achievement: 07/08/21 ?Potential to Achieve Goals: Good  ?  OT Frequency: Min 2X/week ?  ? ?Co-evaluation PT/OT/SLP Co-Evaluation/Treatment: Yes ?Reason for Co-Treatment: To address functional/ADL transfers ?PT goals addressed during session: Mobility/safety with mobility ?OT goals addressed during session: ADL's and self-care ?  ? ?  ?AM-PAC OT "6 Clicks" Daily Activity     ?Outcome Measure Help from another person eating meals?: A Little ?Help from another person taking care of personal grooming?: A Little ?Help from another person toileting, which includes using toliet, bedpan, or  urinal?: A Lot ?Help from another person bathing (including washing, rinsing, drying)?: A Lot ?Help from another person to put on and taking off regular upper body clothing?: A Little ?Help from another person to

## 2021-06-24 NOTE — TOC Progression Note (Addendum)
Transition of Care (TOC) - Progression Note  ? ? ?Patient Details  ?Name: [name] ?MRN: [id number] ?Date of Birth: 03-Mar-1931 ? ?Transition of Care (TOC) CM/SW Contact  ?Dessa Phi, RN ?Phone Number: ?06/24/2021, 10:45 AM ? ?Clinical Narrative: PT recc SNF-await call back from [place] spouse if agree to fax out. ? ?-3:12p-patient/spouse decline SNF;facility has HHPT-MD to order HHPT/OT-faxed w/confirmation to [phone]. They have own care giver,& own transport back to [place]). ?-   ? ? ? ?Expected Discharge Plan: [place] ?Barriers to Discharge: Continued Medical Work up ? ?Expected Discharge Plan and Services ?Expected Discharge Plan: [place] ?  ?Discharge Planning Services: CM Consult ?Post Acute Care Choice: Resumption of Svcs/PTA Provider ?Living arrangements for the past 2 months: [place] ?                ?  ?  ?  ?  ?  ?  ?  ?  ?  ?  ? ? ?Social Determinants of Health (SDOH) Interventions ?  ? ?Readmission Risk Interventions ?   ? View : No data to display.  ?  ?  ?  ? ? ?

## 2021-06-24 NOTE — Progress Notes (Signed)
Patient refused the Pulmicort neb treatment. The patient stated that he did not need or want this tx at this time. Breath sounds are clear and his current oxygen saturation is 99% on room air. Patient encouraged to call if he changes his mind or feels like he has shortness of breath. He stated that he will just use his incentive instead.  ?

## 2021-06-24 NOTE — Progress Notes (Addendum)
Patient ID: [name], male   DOB: 10/29/1931, 88 y.o.   MRN: [id number] ? ? ? Progress Note ? ? Subjective  ? Day # 2 ? CC; GI bleed  ? ?Labs-WBC 12.0/hemoglobin 7.7/hematocrit 25.1 ? ?Patient sitting up in chair, smiling, says he has not had any further bleeding since yesterday morning, denies lightheadedness or dizziness, has had solid food this morning ? ? ? Objective  ? ?Vital signs in last 24 hours: ?Temp:  [97.7 ?F (36.5 ?C)-98.7 ?F (37.1 ?C)] 98.7 ?F (37.1 ?C) (05/09 0419) ?Pulse Rate:  [77-96] 96 (05/09 0419) ?Resp:  [16-20] 16 (05/08 2008) ?BP: (119-157)/(64-81) 133/79 (05/09 0419) ?SpO2:  [97 %-99 %] 99 % (05/09 0419) ?Last BM Date : 06/23/21 ?General:    Very elderly white male in NAD ?Heart:  Regular rate and rhythm; no murmurs ?Lungs: Respirations even and unlabored, lungs CTA bilaterally ?Abdomen:  Soft, nontender and nondistended. Normal bowel sounds. ?Extremities:  Without edema. ?Neurologic:  Alert and oriented,  grossly normal neurologically. ?Psych:  Cooperative. Normal mood and affect. ? ?Lab Results: ?Recent Labs  ?  06/22/21 ?2339 06/23/21 ?0123 06/23/21 ?2703 06/23/21 ?5009 06/23/21 ?1220 06/24/21 ?0710  ?WBC 14.0*  --  14.5*  --   --  12.0*  ?HGB 10.5*   < > 8.9* 8.9* 8.6* 7.7*  ?HCT 35.1*   < > 30.5* 29.1* 28.4* 25.1*  ?PLT 265  --  242  --   --  230  ? < > = values in this interval not displayed.  ? ?BMET ?Recent Labs  ?  06/22/21 ?2339 06/23/21 ?0416  ?NA 139 139  ?K 4.2 4.3  ?CL 108 106  ?CO2 26 28  ?GLUCOSE 189* 151*  ?BUN 20 23  ?CREATININE 0.68 0.64  ?CALCIUM 8.0* 8.3*  ? ?LFT ?Recent Labs  ?  06/23/21 ?0416  ?PROT 5.0*  ?ALBUMIN 2.8*  ?AST 14*  ?ALT 11  ?ALKPHOS 39  ?BILITOT 0.5  ? ?PT/INR ?Recent Labs  ?  06/22/21 ?2339  ?LABPROT 13.9  ?INR 1.1  ? ? ?Studies/Results: ?DG Chest Port 1 View ? ?Result Date: 06/23/2021 ?CLINICAL DATA:  Hypoxia EXAM: PORTABLE CHEST 1 VIEW COMPARISON:  03/03/2021 FINDINGS: Heart and mediastinal contours are within normal limits. No focal opacities or  effusions. No acute bony abnormality. Aortic atherosclerosis. IMPRESSION: No active disease. Electronically Signed   By: Rolm Baptise M.D.   On: 06/23/2021 00:21   ? ? ? ? Assessment / Plan:   ? ?#59 88 year old white male with history of recurrent GI bleeding admitted with acute onset of hematochezia and syncope ?Bleeding consistent with lower GI bleed and very likely diverticular in etiology.  He has had prior diverticular hemorrhages. ?He has had 4 g drop in hemoglobin over the past week but has not required transfusion with this bleed ?Hemoglobin has drifted to 7.7 this morning-teetering on need for transfusion ? ?No active bleeding since yesterday morning ? ?#2 history of gastric and proximal small bowel AVMs status post prior APC ? ?#3 prior history of CVA ?4.  Congestive heart failure ?5.  Hypertension ?6.  Status post splenectomy ?7.  Status post remote Nissen fundoplication ?8.  Dysphagia multifactoral, has had work-up as outpatient ? ?Plan; check hemoglobin again this afternoon and in a.m. Due to his advanced age and multiple comorbidities would transfuse him for hemoglobin less than 7.5. ?If he has not had any further active bleeding and hemoglobin stable hopefully he will be able to be discharged tomorrow. ?Plan to resume  the baby aspirin at  discharge. ? ? ? LOS: 1 day  ? ?Amy Esterwood PA-C 06/24/2021, 9:04 AM ? ? ?_______________________________________________________________________________________________________________________________________ ? ?[facility] MD note: ? ?I personally examined the patient, reviewed the data and agree with the assessment and plan described above.  I provided a substantive portion of the care of this patient (personally provided more than half of the total time dedicated to the treatment of this patient.)  No bleeding, no BM in 2 days 24-36 hours now.  Repeat Hb was 8.6 today (up from 7.7) and so he probably will not need a blood transfusion this admission. He is going to  stay overnight and as long as no recurrent bleeding and cbc looks OK he can be d/c'd tomorrow.  Will follow along. ? ? ?Owens Loffler, MD ?[facility] ?Pager [phone] ? ?  ?

## 2021-06-24 NOTE — Evaluation (Signed)
Physical Therapy Evaluation ?Patient Details ?Name: [name] ?MRN: [id number] ?DOB: December 13, 1931 ?Today's Date: 06/24/2021 ? ?History of Present Illness ? Patient is 88 y.o. male presented to [facility] ED from assisted living facility due to bright red blood per rectum. PMH significant for previous GI bleed presumed diverticular, multifactorial dysphagia, paroxysmal A-fib not anticoagulated, gastric AVM on daily PPI, post CVA on aspirin, esophageal ring, focal stricture dilated in 2009, ruptured spleen status with splenectomy in 2009. ?  ?Clinical Impression ? [name] is 88 y.o. male admitted with above HPI and diagnosis. Patient is currently limited by functional impairments below (see PT problem list). Patient lives with his wife at [place] and is mod independent with rollator for mobility at baseline. Recently he has had greater difficult ambulating and is more reliant on power wheelchair. This session he required min-mod assist for transfers and gait with RW and was able to ambulated only ~4' due to weakness and Lt knee pain. Patient will benefit from continued skilled PT interventions to address impairments and progress independence with mobility, recommending ST rehab at SNF at [facility] prior to return to home with spouse. Acute PT will follow and progress as able.    ?   ? ?Recommendations for follow up therapy are one component of a multi-disciplinary discharge planning process, led by the attending physician.  Recommendations may be updated based on patient status, additional functional criteria and insurance authorization. ? ?Follow Up Recommendations Skilled nursing-short term rehab (<3 hours/day) ? ?  ?Assistance Recommended at Discharge Frequent or constant Supervision/Assistance  ?Patient can return home with the following ? A lot of help with walking and/or transfers;A lot of help with bathing/dressing/bathroom;Assistance with cooking/housework;Direct supervision/assist for medications  management;Assist for transportation;Help with stairs or ramp for entrance ? ?  ?Equipment Recommendations None recommended by PT  ?Recommendations for Other Services ?    ?  ?Functional Status Assessment Patient has had a recent decline in their functional status and demonstrates the ability to make significant improvements in function in a reasonable and predictable amount of time.  ? ?  ?Precautions / Restrictions Precautions ?Precautions: Fall ?Restrictions ?Weight Bearing Restrictions: No  ? ?  ? ?Mobility ? Bed Mobility ?Overal bed mobility: Needs Assistance ?Bed Mobility: Supine to Sit ?  ?  ?Supine to sit: Min guard, HOB elevated ?  ?  ?General bed mobility comments: pt taking extra time to initiate and sequence moving supine>sit EOB. ?  ? ?Transfers ?Overall transfer level: Needs assistance ?Equipment used: Rolling walker (2 wheels) ?Transfers: Sit to/from Stand ?Sit to Stand: Min assist, Mod assist ?  ?  ?  ?  ?  ?General transfer comment: Pt required cues for hand placement on RW and assist to power up from EOB. 2+ for safety. pt's Lt knee hyperextended in standing. ?  ? ?Ambulation/Gait ?Ambulation/Gait assistance: Min assist, Mod assist ?Gait Distance (Feet): 4 Feet ?Assistive device: Rolling walker (2 wheels) ?Gait Pattern/deviations: Step-through pattern, Decreased step length - right, Decreased step length - left, Knee hyperextension - left, Decreased stride length, Decreased weight shift to left ?Gait velocity: decr ?  ?  ?General Gait Details: Mod assist to steady balance and direct walker. pt's Lt knee hyperextending in stance phase and min-mod assist required to block knee to stabilize neutral position and encourage quad activation. ? ?Stairs ?  ?  ?  ?  ?  ? ?Wheelchair Mobility ?  ? ?Modified Rankin (Stroke Patients Only) ?  ? ?  ? ?  Balance Overall balance assessment: Needs assistance ?Sitting-balance support: Feet supported, Bilateral upper extremity supported ?Sitting balance-Leahy Scale:  Fair ?Sitting balance - Comments: posture forward flexed at times ?  ?Standing balance support: Reliant on assistive device for balance, During functional activity, Bilateral upper extremity supported ?Standing balance-Leahy Scale: Poor ?  ?  ?  ?  ?  ?  ?  ?  ?  ?  ?  ?  ?   ? ? ? ?Pertinent Vitals/Pain Pain Assessment ?Pain Assessment: Faces ?Faces Pain Scale: Hurts a little bit ?Pain Location: Lt knee with gait ?Pain Descriptors / Indicators: Aching ?Pain Intervention(s): Limited activity within patient's tolerance, Monitored during session, Repositioned  ? ? ?Home Living Family/patient expects to be discharged to:: Assisted living ?  ?  ?  ?  ?  ?  ?  ?  ?Home Equipment: Shower seat;Grab bars - tub/shower;Grab bars - toilet;Rollator (4 wheels);Wheelchair - power;Other (comment);Hand held shower head;Adaptive equipment ?Additional Comments: pt has a home aid for 5 hours in the morning 5x/week. pt reports he and his wife get by int eh evening and on the weekends.  ?  ?Prior Function Prior Level of Function : History of Falls (last six months) ?  ?  ?  ?  ?  ?  ?Mobility Comments: ~3 falls in latst 6 months per pt report. pt typically uses rollator in home and power wheelchair in community. recently is relying on power wheelchair for all mobility. ?ADLs Comments: Pt able to perform all ADLs majority of time, with wife or home aid assisting as needed. Wife puts clothes out for him, but otherwise he dresses himself. Was previously donning his AFO and knee brace mod I, has assist from aid now. ?  ? ? ?Hand Dominance  ? Dominant Hand: Right ? ?  ?Extremity/Trunk Assessment  ? Upper Extremity Assessment ?Upper Extremity Assessment: Defer to OT evaluation ?  ? ?Lower Extremity Assessment ?Lower Extremity Assessment: RLE deficits/detail;LLE deficits/detail ?RLE Deficits / Details: 4/5 or greater for MMT with hip fexion, knee ext/flex, ankle dorsifelxion ?RLE Sensation: WNL ?RLE Coordination: WNL ?LLE Deficits / Details:  3+ for hip flexion, knee ext/flex, and 4-/5 for dorsiflexion ?LLE Sensation: WNL ?LLE Coordination: WNL ?  ? ?Cervical / Trunk Assessment ?Cervical / Trunk Assessment: Normal  ?Communication  ? Communication: No difficulties  ?Cognition Arousal/Alertness: Awake/alert ?Behavior During Therapy: [facility] for tasks assessed/performed ?Overall Cognitive Status: Within Functional Limits for tasks assessed ?  ?  ?  ?  ?  ?  ?  ?  ?  ?  ?  ?  ?  ?  ?  ?  ?  ?  ?  ? ?  ?General Comments   ? ?  ?Exercises    ? ?Assessment/Plan  ?  ?PT Assessment Patient needs continued PT services  ?PT Problem List Decreased strength;Decreased range of motion;Decreased activity tolerance;Decreased balance;Decreased mobility;Decreased knowledge of use of DME;Decreased safety awareness;Decreased knowledge of precautions;Pain ? ?   ?  ?PT Treatment Interventions DME instruction;Stair training;Gait training;Functional mobility training;Therapeutic activities;Therapeutic exercise;Balance training;Neuromuscular re-education;Patient/family education   ? ?PT Goals (Current goals can be found in the Care Plan section)  ?Acute Rehab PT Goals ?Patient Stated Goal: get better/stronger and be able to walk more again ?PT Goal Formulation: With patient ?Time For Goal Achievement: 07/08/21 ?Potential to Achieve Goals: Good ? ?  ?Frequency Min 2X/week ?  ? ? ?Co-evaluation PT/OT/SLP Co-Evaluation/Treatment: Yes ?Reason for Co-Treatment: To address functional/ADL transfers ?PT goals addressed during session: Mobility/safety  with mobility;Balance;Proper use of DME;Strengthening/ROM ?OT goals addressed during session: ADL's and self-care;Strengthening/ROM ?  ? ? ?  ?AM-PAC PT "6 Clicks" Mobility  ?Outcome Measure Help needed turning from your back to your side while in a flat bed without using bedrails?: A Little ?Help needed moving from lying on your back to sitting on the side of a flat bed without using bedrails?: A Little ?Help needed moving to and from a bed to  a chair (including a wheelchair)?: A Lot ?Help needed standing up from a chair using your arms (e.g., wheelchair or bedside chair)?: A Lot ?Help needed to walk in hospital room?: A Lot ?Help needed climbing

## 2021-06-25 ENCOUNTER — Telehealth: Payer: Self-pay

## 2021-06-25 ENCOUNTER — Other Ambulatory Visit: Payer: Self-pay

## 2021-06-25 DIAGNOSIS — Z66 Do not resuscitate: Secondary | ICD-10-CM

## 2021-06-25 DIAGNOSIS — R1319 Other dysphagia: Secondary | ICD-10-CM

## 2021-06-25 DIAGNOSIS — K5521 Angiodysplasia of colon with hemorrhage: Secondary | ICD-10-CM

## 2021-06-25 DIAGNOSIS — E119 Type 2 diabetes mellitus without complications: Secondary | ICD-10-CM

## 2021-06-25 DIAGNOSIS — Z8719 Personal history of other diseases of the digestive system: Secondary | ICD-10-CM

## 2021-06-25 DIAGNOSIS — K222 Esophageal obstruction: Secondary | ICD-10-CM

## 2021-06-25 DIAGNOSIS — D509 Iron deficiency anemia, unspecified: Secondary | ICD-10-CM

## 2021-06-25 DIAGNOSIS — I480 Paroxysmal atrial fibrillation: Secondary | ICD-10-CM

## 2021-06-25 DIAGNOSIS — I5042 Chronic combined systolic (congestive) and diastolic (congestive) heart failure: Secondary | ICD-10-CM

## 2021-06-25 DIAGNOSIS — Z8673 Personal history of transient ischemic attack (TIA), and cerebral infarction without residual deficits: Secondary | ICD-10-CM

## 2021-06-25 DIAGNOSIS — K31819 Angiodysplasia of stomach and duodenum without bleeding: Secondary | ICD-10-CM

## 2021-06-25 DIAGNOSIS — I10 Essential (primary) hypertension: Secondary | ICD-10-CM

## 2021-06-25 LAB — CBC
HCT: 28 % — ABNORMAL LOW (ref 39.0–52.0)
Hemoglobin: 8.6 g/dL — ABNORMAL LOW (ref 13.0–17.0)
MCH: 27.4 pg (ref 26.0–34.0)
MCHC: 30.7 g/dL (ref 30.0–36.0)
MCV: 89.2 fL (ref 80.0–100.0)
Platelets: 292 10*3/uL (ref 150–400)
RBC: 3.14 MIL/uL — ABNORMAL LOW (ref 4.22–5.81)
RDW: 27.7 % — ABNORMAL HIGH (ref 11.5–15.5)
WBC: 12.3 10*3/uL — ABNORMAL HIGH (ref 4.0–10.5)
nRBC: 0 % (ref 0.0–0.2)

## 2021-06-25 LAB — GLUCOSE, CAPILLARY
Glucose-Capillary: 102 mg/dL — ABNORMAL HIGH (ref 70–99)
Glucose-Capillary: 112 mg/dL — ABNORMAL HIGH (ref 70–99)
Glucose-Capillary: 134 mg/dL — ABNORMAL HIGH (ref 70–99)
Glucose-Capillary: 153 mg/dL — ABNORMAL HIGH (ref 70–99)

## 2021-06-25 MED ORDER — ASPIRIN EC 81 MG PO TBEC: 81.0000 mg | DELAYED_RELEASE_TABLET | Freq: Every day | ORAL | 11 refills | Status: DC

## 2021-06-25 NOTE — Telephone Encounter (Signed)
Ordered CBC as directed. ?

## 2021-06-25 NOTE — Discharge Summary (Signed)
? ?Physician Discharge Summary  ?[name] VXB:[id number] DOB: October 16, 1931 DOA: 06/22/2021 ? ?PCP: Seward Carol, MD ? ?Admit date: 06/22/2021 ?Discharge date: 06/25/2021 ?Admitted From: ALF ?Disposition: ALF ?Recommendations for Outpatient Follow-up:  ?Follow up with primary care doctor in 1 week ?Please obtain CBC/BMP in 1 week ?Please follow up on the following pending results: None ? ?Home Health: PT/OT ?Equipment/Devices: None ? ?Discharge Condition: Stable ?CODE STATUS: DNR/DNI ? Follow-up Information   ? ? [facility] SN[facility]/ALF Follow up.   ?Specialties: [place], [place] ?Why: [facility] physical therapy/occupational therapy ?Contact information: ?[address] ?[place] ?[phone] ? ?  ?  ? ?  ?  ? ?  ? ? ?Hospital course ?88 year old M with PMH of GI bleed presumed diverticular, gastric AVM, paroxysmal A-fib not on AC, diastolic CHF, CVA, or esophageal ring and stricture s/p dilation in 2009, multifactorial dysphagia and ruptured spleen s/p splenectomy in 2009 presenting with bright red blood per rectum and admitted for acute on chronic blood loss anemia felt to be diverticular.  Hgb 10.5 (baseline appears to be 11-12).  GI consulted and did not feel scope is needed as he has not had further GI bleed and his hemoglobin remained stable in the range of 8-9 after initial drop.  He was cleared for discharge by gastroenterology for outpatient follow-up.  GI suggested resuming aspirin on discharge.  ? ?Therapy recommended SNF but patient and family refused.  He is discharged back to ALF with home health. ? ?  ?See individual problem list below for more on hospital course. ? ?Problems addressed during this hospitalization ?Problem  ?GI Bleeding  ?Acute On Chronic Blood Loss Anemia  ?Gastric Avm  ?Dysphagia  ?Dnr (Do Not Resuscitate)  ?Chronic combined CHF with recovered EF  ?Benign Essential Htn  ?Paf (Paroxysmal Atrial Fibrillation) (Hcc)   ?History of Cva (Cerebrovascular Accident)  ?Diabetes Mellitus Type II, Non Insulin Dependent (Hcc)  ?History of esophageal strciture  ?Avm (Arteriovenous Malformation) of Small Bowel, Acquired With Hemorrhage (Resolved)  ?  ? ?Vital signs ?Vitals:  ? 06/24/21 1439 06/24/21 1441 06/24/21 1946 06/25/21 0412  ?BP:  133/78 (!) 144/81 135/90  ?Pulse: 92 90 89 (!) 55  ?Temp:  98 ?F (36.7 ?C) 98.1 ?F (36.7 ?C) 98.8 ?F (37.1 ?C)  ?Resp: '16 17 18 16  '$ ?Height:      ?Weight:      ?SpO2: 99% 100% 100% 97%  ?TempSrc:   Oral Oral  ?BMI (Calculated):      ?  ? ?Discharge exam ? ?GENERAL: No apparent distress.  Nontoxic. ?HEENT: MMM.  Vision and hearing grossly intact.  ?NECK: Supple.  No apparent JVD.  ?RESP:  No IWOB.  Fair aeration bilaterally. ?CVS:  RRR. Heart sounds normal.  ?ABD/GI/GU: BS+. Abd soft, NTND.  ?MSK/EXT:  Moves extremities. No apparent deformity. No edema.  ?SKIN: no apparent skin lesion or wound ?NEURO: Awake and alert. Oriented appropriately.  No apparent focal neuro deficit. ?PSYCH: Calm. Normal affect.  ? ?Discharge Instructions ?Discharge Instructions   ? ? Call MD for:   Complete by: As directed ?  ? Further rectal bleed  ? Call MD for:  difficulty breathing, headache or visual disturbances   Complete by: As directed ?  ? Call MD for:  extreme fatigue   Complete by: As directed ?  ? Call MD for:  persistant dizziness or light-headedness   Complete by: As directed ?  ? Diet general   Complete  by: As directed ?  ? Discharge instructions   Complete by: As directed ?  ? It has been a pleasure taking care of you! ? ?You were hospitalized due to rectal bleed.  Your bleeding seems to have subsided.  We recommend holding your aspirin for 2 to 3 days.  Watch for signs of bleeding including bright red blood or dark tarry stool.  Note that taking iron might turn his stool dark.  Follow-up with your primary care doctor in 1 to 2 weeks or sooner if needed. ? ? ?Take care,  ? Increase activity slowly   Complete by: As  directed ?  ? ?  ? ?Allergies as of 06/25/2021   ? ?   Reactions  ? Tape Other (See Comments)  ? SKIN IS SENSITIVE; PLEASE USE PAPER TAPE; SKIN BRUISES AND TEARS EASILY!!  ? ?  ? ?  ?Medication List  ?  ? ?TAKE these medications   ? ?acetaminophen 325 MG tablet ?Commonly known as: Tylenol ?Take 2 tablets (650 mg total) by mouth every 6 (six) hours as needed for mild pain, fever or headache. ?What changed: when to take this ?  ?albuterol 108 (90 Base) MCG/ACT inhaler ?Commonly known as: VENTOLIN HFA ?Inhale 2 puffs into the lungs every 6 (six) hours as needed for wheezing or shortness of breath. ?  ?aspirin EC 81 MG tablet ?Take 1 tablet (81 mg total) by mouth daily. Swallow whole. ?Start taking on: Jun 28, 2021 ?What changed: These instructions start on Jun 28, 2021. If you are unsure what to do until then, ask your doctor or other care provider. ?  ?budesonide 0.5 MG/2ML nebulizer solution ?Commonly known as: PULMICORT ?Take 2 mLs by nebulization daily. 60m by nebulization daily ?And 250mby nebulization daily as needed for shortness of breath/wheezing ?  ?carvedilol 3.125 MG tablet ?Commonly known as: COREG ?Take 1 tablet (3.125 mg total) by mouth 2 (two) times daily with a meal. ?  ?cholecalciferol 25 MCG (1000 UNIT) tablet ?Commonly known as: VITAMIN D ?Take 1,000 Units by mouth daily. ?  ?divalproex 125 MG capsule ?Commonly known as: DEPAKOTE SPRINKLE ?Take 500 mg by mouth at bedtime. 4 caps per night ?  ?ferrous sulfate 325 (65 FE) MG tablet ?Take 325 mg by mouth 2 (two) times daily. ?  ?fluticasone 50 MCG/ACT nasal spray ?Commonly known as: FLONASE ?Place 1 spray into both nostrils daily as needed for allergies or rhinitis. ?  ?Magnesium 300 MG Caps ?Take 300 mg by mouth daily. ?  ?Melatonin 10 MG Caps ?Take 10 mg by mouth at bedtime. ?  ?metFORMIN 500 MG tablet ?Commonly known as: GLUCOPHAGE ?Take 500 mg by mouth daily. ?  ?pantoprazole 40 MG tablet ?Commonly known as: PROTONIX ?Take 1 tablet (40 mg total) by  mouth daily with breakfast. ?  ?polyethylene glycol 17 g packet ?Commonly known as: MIRALAX / GLYCOLAX ?Take 17 g by mouth daily. ?  ?polyvinyl alcohol 1.4 % ophthalmic solution ?Commonly known as: LIQUIFILM TEARS ?Place 1 drop into both eyes as needed for dry eyes. ?  ?predniSONE 10 MG tablet ?Commonly known as: DELTASONE ?Take 5 mg by mouth daily. ?  ?psyllium 58.6 % packet ?Commonly known as: METAMUCIL ?Take 1 packet by mouth daily. ?  ?rOPINIRole 4 MG tablet ?Commonly known as: REQUIP ?Take 1 tablet (4 mg total) by mouth at bedtime. ?  ?simvastatin 40 MG tablet ?Commonly known as: ZOCOR ?Take 1 tablet (40 mg total) by mouth daily. ?What changed: when to take  this ?  ?traMADol 50 MG tablet ?Commonly known as: ULTRAM ?Take 1 tablet (50 mg total) by mouth daily as needed for moderate pain. ?  ?VITAMIN B-12 PO ?Take 1,000 mcg by mouth daily. ?  ? ?  ? ? ?Consultations: ?Gastroenterology ? ?Procedures/Studies: ? ? ?DG SWALLOW FUNC OP MEDICARE SPEECH PATH ? ?Result Date: 06/03/2021 ?Table formatting from the original result was not included. Images from the original result were not included. Objective Swallowing Evaluation: Type of Study: MBS-Modified Barium Swallow Study  Patient Details Name: [name] MRN: [id number] Date of Birth: [birth year] Today's Date: 06/03/2021 Time: SLP Start Time (ACUTE ONLY): 1121 -SLP Stop Time (ACUTE ONLY): 1133 SLP Time Calculation (min) (ACUTE ONLY): 12 min Past Medical History: Past Medical History: Diagnosis Date  Acute CVA (cerebrovascular accident) ([place]) 07/25/2015  Asthma   Benign essential HTN   Cerebral infarction due to embolism of left middle cerebral artery ([place]) 02/03/2014  Chronic combined systolic and diastolic CHF (congestive heart failure) ([place]) 12/23/2017  Colitis   Colon polyps   Diabetes mellitus type 2, diet-controlled ([place]) 12/22/2013  Diverticulosis   GERD (gastroesophageal reflux disease)   GI bleed   GI bleed   Hemorrhoids   Hiatal hernia   History of esophageal  strciture   HLD (hyperlipidemia)   Osteoarthritis   Proctitis   Status post dilation of esophageal narrowing   Stroke [facility])  Past Surgical History: Past Surgical History: Procedure Laterality Date  ABDOMINAL H

## 2021-06-25 NOTE — Progress Notes (Addendum)
Patient ID: [name], male   DOB: 1931/11/19, 88 y.o.   MRN: [id number] ? ? ? Progress Note ? ? Subjective  ? Day # 3 ?CC; GI bleed ? ?Last hemoglobin, last p.m. 7.6/hematocrit 25.4-pending this a.m. ? ?Up in chair, eating breakfast, in good spirits.  Has no complaints.  No bowel movement yesterday or today thus far ? ? ? ? Objective  ? ?Vital signs in last 24 hours: ?Temp:  [98 ?F (36.7 ?C)-98.8 ?F (37.1 ?C)] 98.8 ?F (37.1 ?C) (05/10 0865) ?Pulse Rate:  [55-92] 55 (05/10 0412) ?Resp:  [16-18] 16 (05/10 0412) ?BP: (133-144)/(78-90) 135/90 (05/10 0412) ?SpO2:  [97 %-100 %] 97 % (05/10 0412) ?Last BM Date : 06/23/21 ?General:   Elderly white male in NAD ?Heart:  Regular rate and rhythm; no murmurs ?Lungs: Respirations even and unlabored, lungs CTA bilaterally ?Abdomen:  Soft, nontender and nondistended. Normal bowel sounds. ?Extremities:  Without edema. ?Neurologic:  Alert and oriented,  grossly normal neurologically. ?Psych:  Cooperative. Normal mood and affect. ? ? ?Lab Results: ?Recent Labs  ?  06/22/21 ?2339 06/23/21 ?0123 06/23/21 ?7846 06/23/21 ?9629 06/24/21 ?0710 06/24/21 ?1130 06/24/21 ?2236  ?WBC 14.0*  --  14.5*  --  12.0*  --   --   ?HGB 10.5*   < > 8.9*   < > 7.7* 8.6* 7.6*  ?HCT 35.1*   < > 30.5*   < > 25.1* 28.3* 25.4*  ?PLT 265  --  242  --  230  --   --   ? < > = values in this interval not displayed.  ? ?BMET ?Recent Labs  ?  06/22/21 ?2339 06/23/21 ?0416  ?NA 139 139  ?K 4.2 4.3  ?CL 108 106  ?CO2 26 28  ?GLUCOSE 189* 151*  ?BUN 20 23  ?CREATININE 0.68 0.64  ?CALCIUM 8.0* 8.3*  ? ?LFT ?Recent Labs  ?  06/23/21 ?0416  ?PROT 5.0*  ?ALBUMIN 2.8*  ?AST 14*  ?ALT 11  ?ALKPHOS 39  ?BILITOT 0.5  ? ?PT/INR ?Recent Labs  ?  06/22/21 ?2339  ?LABPROT 13.9  ?INR 1.1  ? ? ? ? ? ? Assessment / Plan:   ? ?#67 88 year old white male with history of recurrent GI bleeding, and several prior admissions for diverticular bleeding who was admitted after acute onset of hematochezia followed by syncope. ? ?Bleeding  consistent with recurrent diverticular hemorrhage which has resolved No bowel movements yesterday or today ? ?Hemoglobin had drifted but has been stable since yesterday-not require transfusion ? ?Repeat hemoglobin pending this a.m.-assuming this is stable he can be discharged home today, if he has had further drift would transfuse at least 1 unit prior to discharge given his very advanced age and multiple comorbidities. ? ?#2 history of gastric and proximal small bowel AVMs status post prior APC ?#3 history of CVA-just on aspirin ?#4 congestive heart failure ?#5.  Hypertension ?#6.  Dysphagia multifactoral has had work-up as outpatient, has done fine here with regular diet ? ?Plan; await this morning's hemoglobin-hopefully discharge home today ?Have arranged for follow-up hemoglobin next week through our office-he may come for labs on Monday or Tuesday ?Okay to resume baby aspirin at discharge ? ? ? LOS: 2 days  ? ?Amy Esterwood PA-C5/11/2021, 8:42 AM ?  ? ?_______________________________________________________________________________________________________________________________________ ? ?[facility] MD note: ? ?I reviewed the data and agree with the assessment and plan described above. Repeat CBC shows Hb 8.6.  he is safe for d/c today.  He should resume daily  iron, resume ASA at d/c.  My office will arrange CBC for next week. ? ?Please call or page with any further questions or concerns. ? ? ?Owens Loffler, MD ?[facility] ?Pager [phone] ? ?

## 2021-06-25 NOTE — Telephone Encounter (Signed)
-----   Message from Alfredia Ferguson, PA-C sent at 06/25/2021  9:53 AM EDT ----- ?Regarding: follow up labs ?Please place order for patient to have, and have a CBC done Monday or Tuesday next week ?Is been hospitalized with recurrent diverticular bleed/anemia ?Please put the order under Dr. Ardis Hughs as I will be out next week-thanks ? ?

## 2021-06-30 DIAGNOSIS — M170 Bilateral primary osteoarthritis of knee: Secondary | ICD-10-CM | POA: Diagnosis not present

## 2021-07-03 DIAGNOSIS — K922 Gastrointestinal hemorrhage, unspecified: Secondary | ICD-10-CM | POA: Diagnosis not present

## 2021-07-03 DIAGNOSIS — K5731 Diverticulosis of large intestine without perforation or abscess with bleeding: Secondary | ICD-10-CM | POA: Diagnosis not present

## 2021-07-03 DIAGNOSIS — E1169 Type 2 diabetes mellitus with other specified complication: Secondary | ICD-10-CM | POA: Diagnosis not present

## 2021-07-03 DIAGNOSIS — R079 Chest pain, unspecified: Secondary | ICD-10-CM | POA: Diagnosis not present

## 2021-07-03 DIAGNOSIS — D500 Iron deficiency anemia secondary to blood loss (chronic): Secondary | ICD-10-CM | POA: Diagnosis not present

## 2021-07-03 DIAGNOSIS — R0789 Other chest pain: Secondary | ICD-10-CM | POA: Diagnosis not present

## 2021-07-03 DIAGNOSIS — Z7984 Long term (current) use of oral hypoglycemic drugs: Secondary | ICD-10-CM | POA: Diagnosis not present

## 2021-07-07 DIAGNOSIS — I5042 Chronic combined systolic (congestive) and diastolic (congestive) heart failure: Secondary | ICD-10-CM | POA: Diagnosis not present

## 2021-07-07 DIAGNOSIS — R262 Difficulty in walking, not elsewhere classified: Secondary | ICD-10-CM | POA: Diagnosis not present

## 2021-07-07 DIAGNOSIS — I482 Chronic atrial fibrillation, unspecified: Secondary | ICD-10-CM | POA: Diagnosis not present

## 2021-07-07 DIAGNOSIS — M6281 Muscle weakness (generalized): Secondary | ICD-10-CM | POA: Diagnosis not present

## 2021-07-07 DIAGNOSIS — M25562 Pain in left knee: Secondary | ICD-10-CM | POA: Diagnosis not present

## 2021-07-07 DIAGNOSIS — M1712 Unilateral primary osteoarthritis, left knee: Secondary | ICD-10-CM | POA: Diagnosis not present

## 2021-07-11 DIAGNOSIS — M1712 Unilateral primary osteoarthritis, left knee: Secondary | ICD-10-CM | POA: Diagnosis not present

## 2021-07-11 DIAGNOSIS — M6281 Muscle weakness (generalized): Secondary | ICD-10-CM | POA: Diagnosis not present

## 2021-07-11 DIAGNOSIS — I5042 Chronic combined systolic (congestive) and diastolic (congestive) heart failure: Secondary | ICD-10-CM | POA: Diagnosis not present

## 2021-07-11 DIAGNOSIS — R262 Difficulty in walking, not elsewhere classified: Secondary | ICD-10-CM | POA: Diagnosis not present

## 2021-07-11 DIAGNOSIS — M25562 Pain in left knee: Secondary | ICD-10-CM | POA: Diagnosis not present

## 2021-07-11 DIAGNOSIS — I482 Chronic atrial fibrillation, unspecified: Secondary | ICD-10-CM | POA: Diagnosis not present

## 2021-07-15 DIAGNOSIS — M6281 Muscle weakness (generalized): Secondary | ICD-10-CM | POA: Diagnosis not present

## 2021-07-15 DIAGNOSIS — R262 Difficulty in walking, not elsewhere classified: Secondary | ICD-10-CM | POA: Diagnosis not present

## 2021-07-15 DIAGNOSIS — I5042 Chronic combined systolic (congestive) and diastolic (congestive) heart failure: Secondary | ICD-10-CM | POA: Diagnosis not present

## 2021-07-15 DIAGNOSIS — M1712 Unilateral primary osteoarthritis, left knee: Secondary | ICD-10-CM | POA: Diagnosis not present

## 2021-07-15 DIAGNOSIS — I482 Chronic atrial fibrillation, unspecified: Secondary | ICD-10-CM | POA: Diagnosis not present

## 2021-07-18 DIAGNOSIS — I482 Chronic atrial fibrillation, unspecified: Secondary | ICD-10-CM | POA: Diagnosis not present

## 2021-07-18 DIAGNOSIS — M1712 Unilateral primary osteoarthritis, left knee: Secondary | ICD-10-CM | POA: Diagnosis not present

## 2021-07-18 DIAGNOSIS — M6281 Muscle weakness (generalized): Secondary | ICD-10-CM | POA: Diagnosis not present

## 2021-07-18 DIAGNOSIS — I5042 Chronic combined systolic (congestive) and diastolic (congestive) heart failure: Secondary | ICD-10-CM | POA: Diagnosis not present

## 2021-07-18 DIAGNOSIS — M25562 Pain in left knee: Secondary | ICD-10-CM | POA: Diagnosis not present

## 2021-07-18 DIAGNOSIS — R262 Difficulty in walking, not elsewhere classified: Secondary | ICD-10-CM | POA: Diagnosis not present

## 2021-07-23 DIAGNOSIS — H6123 Impacted cerumen, bilateral: Secondary | ICD-10-CM | POA: Diagnosis not present

## 2021-07-23 DIAGNOSIS — I482 Chronic atrial fibrillation, unspecified: Secondary | ICD-10-CM | POA: Diagnosis not present

## 2021-07-23 DIAGNOSIS — R262 Difficulty in walking, not elsewhere classified: Secondary | ICD-10-CM | POA: Diagnosis not present

## 2021-07-23 DIAGNOSIS — I5042 Chronic combined systolic (congestive) and diastolic (congestive) heart failure: Secondary | ICD-10-CM | POA: Diagnosis not present

## 2021-07-23 DIAGNOSIS — M6281 Muscle weakness (generalized): Secondary | ICD-10-CM | POA: Diagnosis not present

## 2021-07-23 DIAGNOSIS — M1712 Unilateral primary osteoarthritis, left knee: Secondary | ICD-10-CM | POA: Diagnosis not present

## 2021-07-24 DIAGNOSIS — I482 Chronic atrial fibrillation, unspecified: Secondary | ICD-10-CM | POA: Diagnosis not present

## 2021-07-24 DIAGNOSIS — I5042 Chronic combined systolic (congestive) and diastolic (congestive) heart failure: Secondary | ICD-10-CM | POA: Diagnosis not present

## 2021-07-24 DIAGNOSIS — R262 Difficulty in walking, not elsewhere classified: Secondary | ICD-10-CM | POA: Diagnosis not present

## 2021-07-24 DIAGNOSIS — M1712 Unilateral primary osteoarthritis, left knee: Secondary | ICD-10-CM | POA: Diagnosis not present

## 2021-07-24 DIAGNOSIS — M6281 Muscle weakness (generalized): Secondary | ICD-10-CM | POA: Diagnosis not present

## 2021-07-25 DIAGNOSIS — M170 Bilateral primary osteoarthritis of knee: Secondary | ICD-10-CM | POA: Diagnosis not present

## 2021-07-27 ENCOUNTER — Other Ambulatory Visit: Payer: Self-pay

## 2021-07-27 ENCOUNTER — Inpatient Hospital Stay (HOSPITAL_COMMUNITY)
Admission: EM | Admit: 2021-07-27 | Discharge: 2021-07-31 | DRG: 378 | Disposition: A | Discharge status: Home Health | Payer: Medicare Other | Source: Skilled Nursing Facility | Attending: Internal Medicine | Admitting: Internal Medicine

## 2021-07-27 ENCOUNTER — Encounter (HOSPITAL_COMMUNITY): Payer: Self-pay

## 2021-07-27 DIAGNOSIS — D62 Acute posthemorrhagic anemia: Secondary | ICD-10-CM | POA: Diagnosis not present

## 2021-07-27 DIAGNOSIS — K625 Hemorrhage of anus and rectum: Secondary | ICD-10-CM | POA: Diagnosis not present

## 2021-07-27 DIAGNOSIS — K31819 Angiodysplasia of stomach and duodenum without bleeding: Secondary | ICD-10-CM | POA: Diagnosis not present

## 2021-07-27 DIAGNOSIS — Z743 Need for continuous supervision: Secondary | ICD-10-CM | POA: Diagnosis not present

## 2021-07-27 DIAGNOSIS — Z7952 Long term (current) use of systemic steroids: Secondary | ICD-10-CM

## 2021-07-27 DIAGNOSIS — I69354 Hemiplegia and hemiparesis following cerebral infarction affecting left non-dominant side: Secondary | ICD-10-CM

## 2021-07-27 DIAGNOSIS — I480 Paroxysmal atrial fibrillation: Secondary | ICD-10-CM | POA: Diagnosis present

## 2021-07-27 DIAGNOSIS — Z7984 Long term (current) use of oral hypoglycemic drugs: Secondary | ICD-10-CM

## 2021-07-27 DIAGNOSIS — G2581 Restless legs syndrome: Secondary | ICD-10-CM | POA: Diagnosis present

## 2021-07-27 DIAGNOSIS — K922 Gastrointestinal hemorrhage, unspecified: Secondary | ICD-10-CM | POA: Diagnosis present

## 2021-07-27 DIAGNOSIS — J45909 Unspecified asthma, uncomplicated: Secondary | ICD-10-CM | POA: Diagnosis not present

## 2021-07-27 DIAGNOSIS — R58 Hemorrhage, not elsewhere classified: Secondary | ICD-10-CM | POA: Diagnosis not present

## 2021-07-27 DIAGNOSIS — D500 Iron deficiency anemia secondary to blood loss (chronic): Secondary | ICD-10-CM | POA: Diagnosis not present

## 2021-07-27 DIAGNOSIS — Z9081 Acquired absence of spleen: Secondary | ICD-10-CM | POA: Diagnosis not present

## 2021-07-27 DIAGNOSIS — E119 Type 2 diabetes mellitus without complications: Secondary | ICD-10-CM | POA: Diagnosis present

## 2021-07-27 DIAGNOSIS — Z820 Family history of epilepsy and other diseases of the nervous system: Secondary | ICD-10-CM

## 2021-07-27 DIAGNOSIS — I110 Hypertensive heart disease with heart failure: Secondary | ICD-10-CM | POA: Diagnosis not present

## 2021-07-27 DIAGNOSIS — Z808 Family history of malignant neoplasm of other organs or systems: Secondary | ICD-10-CM | POA: Diagnosis not present

## 2021-07-27 DIAGNOSIS — Z79899 Other long term (current) drug therapy: Secondary | ICD-10-CM | POA: Diagnosis not present

## 2021-07-27 DIAGNOSIS — R569 Unspecified convulsions: Secondary | ICD-10-CM

## 2021-07-27 DIAGNOSIS — I5042 Chronic combined systolic (congestive) and diastolic (congestive) heart failure: Secondary | ICD-10-CM | POA: Diagnosis not present

## 2021-07-27 DIAGNOSIS — Z803 Family history of malignant neoplasm of breast: Secondary | ICD-10-CM | POA: Diagnosis not present

## 2021-07-27 DIAGNOSIS — R6889 Other general symptoms and signs: Secondary | ICD-10-CM | POA: Diagnosis not present

## 2021-07-27 DIAGNOSIS — K5731 Diverticulosis of large intestine without perforation or abscess with bleeding: Secondary | ICD-10-CM | POA: Diagnosis not present

## 2021-07-27 DIAGNOSIS — Z7982 Long term (current) use of aspirin: Secondary | ICD-10-CM | POA: Diagnosis not present

## 2021-07-27 DIAGNOSIS — I10 Essential (primary) hypertension: Secondary | ICD-10-CM | POA: Diagnosis present

## 2021-07-27 DIAGNOSIS — K219 Gastro-esophageal reflux disease without esophagitis: Secondary | ICD-10-CM | POA: Diagnosis not present

## 2021-07-27 DIAGNOSIS — E785 Hyperlipidemia, unspecified: Secondary | ICD-10-CM | POA: Diagnosis present

## 2021-07-27 DIAGNOSIS — K921 Melena: Secondary | ICD-10-CM | POA: Diagnosis not present

## 2021-07-27 DIAGNOSIS — G4089 Other seizures: Secondary | ICD-10-CM | POA: Diagnosis present

## 2021-07-27 LAB — CBC WITH DIFFERENTIAL/PLATELET
Abs Immature Granulocytes: 0.04 10*3/uL (ref 0.00–0.07)
Basophils Absolute: 0 10*3/uL (ref 0.0–0.1)
Basophils Relative: 0 %
Eosinophils Absolute: 0 10*3/uL (ref 0.0–0.5)
Eosinophils Relative: 0 %
HCT: 36.8 % — ABNORMAL LOW (ref 39.0–52.0)
Hemoglobin: 11.3 g/dL — ABNORMAL LOW (ref 13.0–17.0)
Immature Granulocytes: 0 %
Lymphocytes Relative: 20 %
Lymphs Abs: 2.1 10*3/uL (ref 0.7–4.0)
MCH: 29.2 pg (ref 26.0–34.0)
MCHC: 30.7 g/dL (ref 30.0–36.0)
MCV: 95.1 fL (ref 80.0–100.0)
Monocytes Absolute: 1.2 10*3/uL — ABNORMAL HIGH (ref 0.1–1.0)
Monocytes Relative: 11 %
Neutro Abs: 6.9 10*3/uL (ref 1.7–7.7)
Neutrophils Relative %: 69 %
Platelets: 362 10*3/uL (ref 150–400)
RBC: 3.87 MIL/uL — ABNORMAL LOW (ref 4.22–5.81)
RDW: 19.1 % — ABNORMAL HIGH (ref 11.5–15.5)
WBC: 10.3 10*3/uL (ref 4.0–10.5)
nRBC: 0 % (ref 0.0–0.2)

## 2021-07-27 LAB — COMPREHENSIVE METABOLIC PANEL
ALT: 13 U/L (ref 0–44)
AST: 15 U/L (ref 15–41)
Albumin: 3.3 g/dL — ABNORMAL LOW (ref 3.5–5.0)
Alkaline Phosphatase: 47 U/L (ref 38–126)
Anion gap: 5 (ref 5–15)
BUN: 29 mg/dL — ABNORMAL HIGH (ref 8–23)
CO2: 28 mmol/L (ref 22–32)
Calcium: 8.6 mg/dL — ABNORMAL LOW (ref 8.9–10.3)
Chloride: 106 mmol/L (ref 98–111)
Creatinine, Ser: 0.67 mg/dL (ref 0.61–1.24)
GFR, Estimated: >60 mL/min (ref >60)
Glucose, Bld: 117 mg/dL — ABNORMAL HIGH (ref 70–99)
Potassium: 4.5 mmol/L (ref 3.5–5.1)
Sodium: 139 mmol/L (ref 135–145)
Total Bilirubin: 0.3 mg/dL (ref 0.3–1.2)
Total Protein: 6 g/dL — ABNORMAL LOW (ref 6.5–8.1)

## 2021-07-27 NOTE — ED Triage Notes (Signed)
Pt arrived via EMS from home reporting rectal bleeding for the last 3 hours.

## 2021-07-27 NOTE — ED Notes (Signed)
Hemoccult card at bedside if needed for provider.

## 2021-07-28 ENCOUNTER — Encounter (HOSPITAL_COMMUNITY): Payer: Self-pay | Admitting: Internal Medicine

## 2021-07-28 DIAGNOSIS — I480 Paroxysmal atrial fibrillation: Secondary | ICD-10-CM | POA: Diagnosis not present

## 2021-07-28 DIAGNOSIS — I69354 Hemiplegia and hemiparesis following cerebral infarction affecting left non-dominant side: Secondary | ICD-10-CM

## 2021-07-28 DIAGNOSIS — K921 Melena: Secondary | ICD-10-CM | POA: Diagnosis not present

## 2021-07-28 DIAGNOSIS — Z9081 Acquired absence of spleen: Secondary | ICD-10-CM | POA: Diagnosis not present

## 2021-07-28 DIAGNOSIS — D62 Acute posthemorrhagic anemia: Secondary | ICD-10-CM | POA: Diagnosis present

## 2021-07-28 DIAGNOSIS — K31819 Angiodysplasia of stomach and duodenum without bleeding: Secondary | ICD-10-CM | POA: Diagnosis present

## 2021-07-28 DIAGNOSIS — Z79899 Other long term (current) drug therapy: Secondary | ICD-10-CM | POA: Diagnosis not present

## 2021-07-28 DIAGNOSIS — E785 Hyperlipidemia, unspecified: Secondary | ICD-10-CM

## 2021-07-28 DIAGNOSIS — Z808 Family history of malignant neoplasm of other organs or systems: Secondary | ICD-10-CM | POA: Diagnosis not present

## 2021-07-28 DIAGNOSIS — Z7984 Long term (current) use of oral hypoglycemic drugs: Secondary | ICD-10-CM | POA: Diagnosis not present

## 2021-07-28 DIAGNOSIS — I110 Hypertensive heart disease with heart failure: Secondary | ICD-10-CM | POA: Diagnosis present

## 2021-07-28 DIAGNOSIS — Z7982 Long term (current) use of aspirin: Secondary | ICD-10-CM | POA: Diagnosis not present

## 2021-07-28 DIAGNOSIS — R569 Unspecified convulsions: Secondary | ICD-10-CM

## 2021-07-28 DIAGNOSIS — E119 Type 2 diabetes mellitus without complications: Secondary | ICD-10-CM

## 2021-07-28 DIAGNOSIS — G2581 Restless legs syndrome: Secondary | ICD-10-CM | POA: Diagnosis present

## 2021-07-28 DIAGNOSIS — I10 Essential (primary) hypertension: Secondary | ICD-10-CM | POA: Diagnosis not present

## 2021-07-28 DIAGNOSIS — J45909 Unspecified asthma, uncomplicated: Secondary | ICD-10-CM | POA: Diagnosis present

## 2021-07-28 DIAGNOSIS — K922 Gastrointestinal hemorrhage, unspecified: Secondary | ICD-10-CM

## 2021-07-28 DIAGNOSIS — K625 Hemorrhage of anus and rectum: Secondary | ICD-10-CM | POA: Diagnosis not present

## 2021-07-28 DIAGNOSIS — I5042 Chronic combined systolic (congestive) and diastolic (congestive) heart failure: Secondary | ICD-10-CM

## 2021-07-28 DIAGNOSIS — G4089 Other seizures: Secondary | ICD-10-CM | POA: Diagnosis present

## 2021-07-28 DIAGNOSIS — Z7952 Long term (current) use of systemic steroids: Secondary | ICD-10-CM | POA: Diagnosis not present

## 2021-07-28 DIAGNOSIS — Z820 Family history of epilepsy and other diseases of the nervous system: Secondary | ICD-10-CM | POA: Diagnosis not present

## 2021-07-28 DIAGNOSIS — K5731 Diverticulosis of large intestine without perforation or abscess with bleeding: Secondary | ICD-10-CM | POA: Diagnosis present

## 2021-07-28 DIAGNOSIS — K219 Gastro-esophageal reflux disease without esophagitis: Secondary | ICD-10-CM | POA: Diagnosis present

## 2021-07-28 DIAGNOSIS — Z803 Family history of malignant neoplasm of breast: Secondary | ICD-10-CM | POA: Diagnosis not present

## 2021-07-28 DIAGNOSIS — D500 Iron deficiency anemia secondary to blood loss (chronic): Secondary | ICD-10-CM | POA: Diagnosis not present

## 2021-07-28 LAB — HEMOGLOBIN AND HEMATOCRIT, BLOOD
HCT: 34.5 % — ABNORMAL LOW (ref 39.0–52.0)
HCT: 31.6 % — ABNORMAL LOW (ref 39.0–52.0)
HCT: 28.9 % — ABNORMAL LOW (ref 39.0–52.0)
Hemoglobin: 10.5 g/dL — ABNORMAL LOW (ref 13.0–17.0)
Hemoglobin: 9.6 g/dL — ABNORMAL LOW (ref 13.0–17.0)
Hemoglobin: 8.7 g/dL — ABNORMAL LOW (ref 13.0–17.0)

## 2021-07-28 LAB — POC OCCULT BLOOD, ED: Fecal Occult Bld: POSITIVE — AB

## 2021-07-28 MED ORDER — ALBUTEROL SULFATE (2.5 MG/3ML) 0.083% IN NEBU: 2.5000 mg | INHALATION_SOLUTION | RESPIRATORY_TRACT | Status: DC | PRN

## 2021-07-28 MED ORDER — DIVALPROEX SODIUM 125 MG PO CSDR
500.0000 mg | DELAYED_RELEASE_CAPSULE | Freq: Every day | ORAL | Status: DC
  Administered 2021-07-28 – 2021-07-30 (×3): 500 mg via ORAL
  Filled 2021-07-28 (×3): qty 4

## 2021-07-28 MED ORDER — PANTOPRAZOLE SODIUM 40 MG IV SOLR: 40.0000 mg | Freq: Two times a day (BID) | INTRAVENOUS | Status: DC

## 2021-07-28 MED ORDER — ONDANSETRON HCL 4 MG/2ML IJ SOLN: 4.0000 mg | Freq: Four times a day (QID) | INTRAMUSCULAR | Status: DC | PRN

## 2021-07-28 MED ORDER — ACETAMINOPHEN 650 MG RE SUPP: 650.0000 mg | Freq: Four times a day (QID) | RECTAL | Status: DC | PRN

## 2021-07-28 MED ORDER — SIMVASTATIN 40 MG PO TABS
40.0000 mg | ORAL_TABLET | Freq: Every day | ORAL | Status: DC
  Administered 2021-07-28 – 2021-07-30 (×3): 40 mg via ORAL
  Filled 2021-07-28 (×3): qty 1

## 2021-07-28 MED ORDER — MELATONIN 5 MG PO TABS
10.0000 mg | ORAL_TABLET | Freq: Every day | ORAL | Status: DC
  Administered 2021-07-28 – 2021-07-30 (×3): 10 mg via ORAL
  Filled 2021-07-28 (×3): qty 2

## 2021-07-28 MED ORDER — ONDANSETRON HCL 4 MG PO TABS: 4.0000 mg | ORAL_TABLET | Freq: Four times a day (QID) | ORAL | Status: DC | PRN

## 2021-07-28 MED ORDER — CARVEDILOL 3.125 MG PO TABS
3.1250 mg | ORAL_TABLET | Freq: Two times a day (BID) | ORAL | Status: DC
  Administered 2021-07-28 – 2021-07-31 (×7): 3.125 mg via ORAL
  Filled 2021-07-28 (×7): qty 1

## 2021-07-28 MED ORDER — LACTATED RINGERS IV SOLN
INTRAVENOUS | Status: AC
Start: 2021-07-28 — End: 2021-07-29

## 2021-07-28 MED ORDER — PANTOPRAZOLE SODIUM 40 MG IV SOLR
40.0000 mg | Freq: Once | INTRAVENOUS | Status: AC
  Administered 2021-07-28: 40 mg via INTRAVENOUS
  Filled 2021-07-28: qty 10

## 2021-07-28 MED ORDER — PANTOPRAZOLE SODIUM 40 MG IV SOLR
40.0000 mg | Freq: Two times a day (BID) | INTRAVENOUS | Status: DC
Start: 2021-07-28 — End: 2021-07-29
  Administered 2021-07-28 – 2021-07-29 (×3): 40 mg via INTRAVENOUS
  Filled 2021-07-28 (×3): qty 10

## 2021-07-28 MED ORDER — ROPINIROLE HCL 1 MG PO TABS
4.0000 mg | ORAL_TABLET | Freq: Every day | ORAL | Status: DC
  Administered 2021-07-28 – 2021-07-30 (×3): 4 mg via ORAL
  Filled 2021-07-28 (×3): qty 4

## 2021-07-28 MED ORDER — ACETAMINOPHEN 325 MG PO TABS: 650.0000 mg | ORAL_TABLET | Freq: Four times a day (QID) | ORAL | Status: DC | PRN

## 2021-07-28 NOTE — Assessment & Plan Note (Signed)
Stable. Not on systemic anticoagulation due to GI bleeding. Will hold ASA.

## 2021-07-28 NOTE — Assessment & Plan Note (Signed)
Stable. Euvolemic. ?

## 2021-07-28 NOTE — Assessment & Plan Note (Signed)
Stable. Continue depakote 500 mg qhs.

## 2021-07-28 NOTE — H&P (Signed)
History and Physical    [name]:[id number] DOB: 12/10/1931 DOA: 07/27/2021  DOS: [name] was seen and examined on 07/27/2021  PCP: [facility] Carol, MD   [name] coming from: Home. Lives at [name] in independent living.  I have personally briefly reviewed [name]'s old medical records in [name]  CC: bloody stools HPI: 88 year old male history of type 2 diabetes, history of paroxysmal atrial fibrillation not on anticoagulation due to GI bleeding, history of gastric AVMs, hypertension, hyperlipidemia presents to [facility] today with onset of bloody stools around 7 PM on 07/27/2021.  [name] to [facility] in May 2023 for GI bleed.  Hemoglobin was stabilized.  He did not undergo endoscopy.  Last endoscopy was in 2021.  At that time he had enteroscopy.  He had gastric AVM that was cauterized with argon plasma laser.  [name] has been maintained on aspirin for his anticoagulation for his paroxysmal atrial fibrillation.  Fortunately, [name] has been on iron since he was discharged from [facility] in May 2023.  When he was discharged in May 2023, his discharge hemoglobin was 8.6 g/dL.  Today hemoglobin is 11.3 g/dL.  [name] denies any chest pain or shortness of breath.  Denies any abdominal pain.  Hemoccult was positive but [name] is on iron.  [name] states he has had both bloody and black stools today.  [facility] hospital[facility] contacted for admission.  [name] was a DNR in May 2023.  [name] is elected to be a full code this admission.    ED Course: hemoccult positive. HgB 11.6  Review of Systems:  Review of Systems  Constitutional: Negative.   HENT: Negative.    Eyes: Negative.   Respiratory: Negative.    Cardiovascular: Negative.   Gastrointestinal:  Positive for blood in stool.  Skin: Negative.   Neurological:  Positive for weakness.       Chronic left sided paresis since his CVA 5 years ago. Pt uses walker.  Endo/Heme/Allergies:  Negative.   Psychiatric/Behavioral: Negative.    All other systems reviewed and are negative.   Past Medical History:  Diagnosis Date   Acute CVA (cerebrovascular accident) ([place]) 07/25/2015   Asthma    Benign essential HTN    Cerebral infarction due to embolism of left middle cerebral artery ([name]) 02/03/2014   Chronic combined systolic and diastolic CHF (congestive heart failure) ([place]) 12/23/2017   Colitis    Colon polyps    Diabetes mellitus type 2, diet-controlled ([place]) 12/22/2013   Diverticulosis    GERD (gastroesophageal reflux disease)    GI bleed    GI bleed    Hemorrhoids    Hiatal hernia    History of esophageal strciture    HLD (hyperlipidemia)    Osteoarthritis    Proctitis    Status post dilation of esophageal narrowing    Stroke [name])     Past Surgical History:  Procedure Laterality Date   ABDOMINAL HERNIA REPAIR     BIOPSY  04/05/2019   Procedure: BIOPSY;  Surgeon: Thornton Abraha, MD;  Location: [facility];  Service: Gastroenterology;;   COLONOSCOPY     multiple   COLONOSCOPY WITH PROPOFOL N/A 12/12/2016   Procedure: COLONOSCOPY WITH PROPOFOL;  Surgeon: Gatha Mayer, MD;  Location: [facility] ENDOSCOPY;  Service: Endoscopy;  Laterality: N/A;   COLONOSCOPY WITH PROPOFOL N/A 04/04/2019   Procedure: COLONOSCOPY WITH PROPOFOL;  Surgeon: Milus Banister, MD;  Location: [name];  Service: Endoscopy;  Laterality: N/A;   ENTEROSCOPY  N/A 01/16/2020   Procedure: ENTEROSCOPY;  Surgeon: Rush Landmark Telford Nab., MD;  Location: [facility];  Service: Gastroenterology;  Laterality: N/A;   ENTEROSCOPY N/A 01/20/2020   Procedure: ENTEROSCOPY;  Surgeon: Rush Landmark Telford Nab., MD;  Location: [facility];  Service: Gastroenterology;  Laterality: N/A;   ENTEROSCOPY N/A 09/16/2020   Procedure: ENTEROSCOPY;  Surgeon: Ladene Artist, MD;  Location: [facility];  Service: Endoscopy;  Laterality: N/A;  push enteroscopy   ESOPHAGOGASTRODUODENOSCOPY     multiple    ESOPHAGOGASTRODUODENOSCOPY (EGD) WITH PROPOFOL N/A 04/05/2019   Procedure: ESOPHAGOGASTRODUODENOSCOPY (EGD) WITH PROPOFOL;  Surgeon: Thornton Evenson, MD;  Location: [facility];  Service: Gastroenterology;  Laterality: N/A;   ESOPHAGOGASTRODUODENOSCOPY (EGD) WITH PROPOFOL N/A 01/18/2020   Procedure: ESOPHAGOGASTRODUODENOSCOPY (EGD) WITH PROPOFOL;  Surgeon: Doran Stabler, MD;  Location: [facility];  Service: Gastroenterology;  Laterality: N/A;  For placement of video capsule study   GIVENS CAPSULE STUDY N/A 01/16/2020   Procedure: [place];  Surgeon: Irving Copas., MD;  Location: [facility];  Service: Gastroenterology;  Laterality: N/A;   GIVENS CAPSULE STUDY N/A 01/18/2020   Procedure: GIVENS CAPSULE STUDY;  Surgeon: Doran Stabler, MD;  Location: [facility];  Service: Gastroenterology;  Laterality: N/A;   HOT HEMOSTASIS N/A 01/20/2020   Procedure: HOT HEMOSTASIS (ARGON PLASMA COAGULATION/BICAP);  Surgeon: Irving Copas., MD;  Location: [facility];  Service: Gastroenterology;  Laterality: N/A;   HOT HEMOSTASIS N/A 09/16/2020   Procedure: HOT HEMOSTASIS (ARGON PLASMA COAGULATION/BICAP);  Surgeon: Ladene Artist, MD;  Location: [facility];  Service: Endoscopy;  Laterality: N/A;   INSERTION OF MESH  01/29/2012   Procedure: INSERTION OF MESH;  Surgeon: Gwenyth Ober, MD;  Location: [place] [facility];  Service: General;  Laterality: N/A;   IR ANGIOGRAM SELECTIVE EACH ADDITIONAL VESSEL  04/01/2019   IR ANGIOGRAM VISCERAL SELECTIVE  04/01/2019   IR ANGIOGRAM VISCERAL SELECTIVE  04/01/2019   IR ANGIOGRAM VISCERAL SELECTIVE  04/01/2019   IR US GUIDE VASC ACCESS RIGHT  3/71/6967   NISSEN FUNDOPLICATION     SAVORY DILATION N/A 01/16/2020   Procedure: SAVORY DILATION;  Surgeon: Irving Copas., MD;  Location: [facility];  Service: Gastroenterology;  Laterality: N/A;   SAVORY DILATION N/A 09/16/2020   Procedure: SAVORY DILATION;  Surgeon: Ladene Artist, MD;   Location: [facility];  Service: Endoscopy;  Laterality: N/A;   SCLEROTHERAPY  01/20/2020   Procedure: SCLEROTHERAPY;  Surgeon: Mansouraty, Telford Nab., MD;  Location: [facility];  Service: Gastroenterology;;   SPLENECTOMY, TOTAL     nontraumatic rupture   SUBMUCOSAL TATTOO INJECTION  01/16/2020   Procedure: SUBMUCOSAL TATTOO INJECTION;  Surgeon: Irving Copas., MD;  Location: [facility];  Service: Gastroenterology;;   VENTRAL HERNIA REPAIR  01/29/2012    WITH MESH   VENTRAL HERNIA REPAIR  01/29/2012   Procedure: HERNIA REPAIR VENTRAL ADULT;  Surgeon: Gwenyth Ober, MD;  Location: [facility];  Service: General;  Laterality: N/A;  open recurrent ventral hernia repair with mesh     reports that he has never smoked. He has never used smokeless tobacco. He reports current alcohol use of about 2.0 standard drinks of alcohol per week. He reports that he does not use drugs.  Allergies  Allergen Reactions   Tape Other (See Comments)    SKIN IS SENSITIVE; PLEASE USE PAPER TAPE; SKIN BRUISES AND TEARS EASILY!!    Family History  Problem Relation Age of Onset   Alzheimer's disease Mother    Breast cancer Mother  Throat cancer Father    Colon cancer Neg Hx    Colon polyps Neg Hx    Pancreatic cancer Neg Hx    Stomach cancer Neg Hx    Liver disease Neg Hx    Esophageal cancer Neg Hx    Rectal cancer Neg Hx     Prior to Admission medications   Medication Sig Start Date End Date Taking? Authorizing Provider  acetaminophen (TYLENOL) 325 MG tablet Take 2 tablets (650 mg total) by mouth every 6 (six) hours as needed for mild pain, fever or headache. [name] taking differently: Take 650 mg by mouth 2 (two) times daily. 03/12/21 03/12/22 Yes Elgergawy, Silver Huguenin, MD  albuterol (VENTOLIN HFA) 108 (90 Base) MCG/ACT inhaler Inhale 2 puffs into [facility] lungs every 6 (six) hours as needed for wheezing or shortness of breath.   Yes [provider]  aspirin EC 81 MG tablet Take 1 tablet (81  mg total) by mouth daily. Swallow whole. 06/28/21 06/28/22 Yes Gonfa, Charlesetta Ivory, MD  budesonide (PULMICORT) 0.5 MG/2ML nebulizer solution Take 2 mLs by nebulization daily. 73m by nebulization daily And 232mby nebulization daily as needed for shortness of breath/wheezing 09/01/18  Yes [provider]  carvedilol (COREG) 3.125 MG tablet Take 1 tablet (3.125 mg total) by mouth 2 (two) times daily with a meal. 03/12/21   Elgergawy, DaSilver HugueninMD  cholecalciferol (VITAMIN D) 25 MCG (1000 UNIT) tablet Take 1,000 Units by mouth daily.    [provider]  Cyanocobalamin (VITAMIN B-12 PO) Take 1,000 mcg by mouth daily.    [provider]  divalproex (DEPAKOTE SPRINKLE) 125 MG capsule Take 500 mg by mouth at bedtime. 4 caps per night    [provider]  ferrous sulfate 325 (65 FE) MG tablet Take 325 mg by mouth 2 (two) times daily.    [provider]  fluticasone (FLONASE) 50 MCG/ACT nasal spray Place 1 spray into both nostrils daily as needed for allergies or rhinitis.  06/18/14   [provider]  Magnesium 300 MG CAPS Take 300 mg by mouth daily.    [provider]  Melatonin 10 MG CAPS Take 10 mg by mouth at bedtime.    [provider]  metFORMIN (GLUCOPHAGE) 500 MG tablet Take 500 mg by mouth daily. 08/23/19   [provider]  pantoprazole (PROTONIX) 40 MG tablet Take 1 tablet (40 mg total) by mouth daily with breakfast. 02/04/21   Esterwood, Amy S, PA-C  polyethylene glycol (MIRALAX / GLYCOLAX) 17 g packet Take 17 g by mouth daily.    [provider]  polyvinyl alcohol (LIQUIFILM TEARS) 1.4 % ophthalmic solution Place 1 drop into both eyes as needed for dry eyes.    [provider]  predniSONE (DELTASONE) 10 MG tablet Take 5 mg by mouth daily.  07/11/19   [provider]  psyllium (METAMUCIL) 58.6 % packet Take 1 packet by mouth daily.    [provider]  rOPINIRole (REQUIP) 4 MG tablet Take 1 tablet (4  mg total) by mouth at bedtime. 05/12/21   McFrann RiderNP  simvastatin (ZOCOR) 40 MG tablet Take 1 tablet (40 mg total) by mouth daily. [name] taking differently: Take 40 mg by mouth at bedtime. 02/26/16   Amin, AnJeanella FlatteryMD  traMADol (ULTRAM) 50 MG tablet Take 1 tablet (50 mg total) by mouth daily as needed for moderate pain. 03/12/21   Elgergawy, DaSilver HugueninMD    Physical Exam: Vitals:   07/27/21  2151 07/27/21 2300 07/28/21 0000 07/28/21 0030  BP: (!) 157/91 136/76 137/66 (!) 152/84  Pulse: 66 (!) 58 65 66  Resp: '18 17 20 18  '$ Temp: 98.9 F (37.2 C)     TempSrc: Oral     SpO2: 98% 97% 97% 96%  Weight:      Height:        Physical Exam Vitals and nursing note reviewed.  Constitutional:      General: He is not in acute distress.    Appearance: Normal appearance. He is normal weight. He is not ill-appearing, toxic-appearing or diaphoretic.  HENT:     Head: Normocephalic and atraumatic.  Eyes:     General: No scleral icterus.    Pupils: Pupils are equal, round, and reactive to light.  Cardiovascular:     Rate and Rhythm: Normal rate and regular rhythm.     Pulses: Normal pulses.  Pulmonary:     Effort: Pulmonary effort is normal. No respiratory distress.     Breath sounds: Normal breath sounds. No wheezing or rales.  Abdominal:     General: Abdomen is flat. Bowel sounds are normal. There is no distension.     Palpations: Abdomen is soft.     Tenderness: There is no abdominal tenderness. There is no guarding.  Musculoskeletal:     Right lower leg: No edema.     Left lower leg: No edema.  Skin:    General: Skin is warm and dry.     Capillary Refill: Capillary refill takes less than 2 seconds.  Neurological:     General: No focal deficit present.     Mental Status: He is alert and oriented to person, place, and time.      Labs on Admission: I have personally reviewed following labs and imaging studies  CBC: Recent Labs  Lab 07/27/21 2217  WBC 10.3  NEUTROABS  6.9  HGB 11.3*  HCT 36.8*  MCV 95.1  PLT 712   Basic Metabolic Panel: Recent Labs  Lab 07/27/21 2217  NA 139  K 4.5  CL 106  CO2 28  GLUCOSE 117*  BUN 29*  CREATININE 0.67  CALCIUM 8.6*   GFR: Estimated Creatinine Clearance: 62.6 mL/min (by C-G formula based on SCr of 0.67 mg/dL). Liver Function Tests: Recent Labs  Lab 07/27/21 2217  AST 15  ALT 13  ALKPHOS 47  BILITOT 0.3  PROT 6.0*  ALBUMIN 3.3*   No results for input(s): "LIPASE", "AMYLASE" in [facility] last 168 hours. No results for input(s): "AMMONIA" in [facility] last 168 hours. Coagulation Profile: No results for input(s): "INR", "PROTIME" in [facility] last 168 hours. Cardiac Enzymes: No results for input(s): "CKTOTAL", "CKMB", "CKMBINDEX", "TROPONINI", "TROPONINIHS" in [facility] last 168 hours. BNP (last 3 results) No results for input(s): "PROBNP" in [facility] last 8760 hours. HbA1C: No results for input(s): "HGBA1C" in [facility] last 72 hours. CBG: No results for input(s): "GLUCAP" in [facility] last 168 hours. Lipid Profile: No results for input(s): "CHOL", "HDL", "LDLCALC", "TRIG", "CHOLHDL", "LDLDIRECT" in [facility] last 72 hours. Thyroid Function Tests: No results for input(s): "TSH", "T4TOTAL", "FREET4", "T3FREE", "THYROIDAB" in [facility] last 72 hours. Anemia Panel: No results for input(s): "VITAMINB12", "FOLATE", "FERRITIN", "TIBC", "IRON", "RETICCTPCT" in [facility] last 72 hours. Urine analysis:    Component Value Date/Time   COLORURINE STRAW (A) 03/08/2021 [facility]   APPEARANCEUR CLEAR 03/08/2021 1919   LABSPEC 1.006 03/08/2021 [facility]   PHURINE 6.0 03/08/2021 1919   GLUCOSEU NEGATIVE 03/08/2021 [place] NEGATIVE 03/08/2021 1919  BILIRUBINUR NEGATIVE 03/08/2021 [place] NEGATIVE 03/08/2021 1919   PROTEINUR NEGATIVE 03/08/2021 [facility]   UROBILINOGEN 0.2 12/22/2013 1210   NITRITE NEGATIVE 03/08/2021 [facility]   LEUKOCYTESUR NEGATIVE 03/08/2021 1919    Radiological Exams on Admission: I have personally reviewed images No results found.  EKG:  My personal interpretation of EKG shows: no ekg performed.    Assessment/Plan Principal Problem:   GI bleeding Active Problems:   Diabetes mellitus type II, non insulin dependent (HCC)   Seizures (HCC)   HLD (hyperlipidemia)   Benign essential HTN   PAF (paroxysmal atrial fibrillation) (HCC)   Chronic combined CHF with recovered EF   Hemiparesis affecting left side as late effect of cerebrovascular accident (CVA) ([place])    Assessment and Plan: * GI bleeding Admit to med/surg bed. Hx of gastric AVMs. Start IV protonix 40 mg bid. Serial H&H q6h. Clear liquid diet.  GI bleeding is a Acute illness/condition that poses a threat to life or bodily function.   Chronic combined CHF with recovered EF Stable. Euvolemic.  PAF (paroxysmal atrial fibrillation) (HCC) Stable. Not on systemic anticoagulation due to GI bleeding. Will hold ASA.  Benign essential HTN Stable. On coreg 3.125 mg bid.  HLD (hyperlipidemia) Stable. Continue zocor 40 mg daily.  Seizures (HCC) Stable. Continue depakote 500 mg qhs.  Diabetes mellitus type II, non insulin dependent (HCC) Stable. Add SSI.  Hemiparesis affecting left side as late effect of cerebrovascular accident (CVA) ([place]) Stable. Uses a walker at independent living.   DVT prophylaxis: SCDs Code Status: Full Code Family Communication: no family at bedside  Disposition Plan: [place]  Consults called: none  Admission status: Observation, Med-Surg   Kristopher Oppenheim, DO [facility] 07/28/2021, 1:37 AM

## 2021-07-28 NOTE — ED Notes (Signed)
Urine specimen/UA obtained, placed at bedside if needed for order.

## 2021-07-28 NOTE — Subjective & Objective (Addendum)
CC: bloody stools HPI: 88 year old male history of type 2 diabetes, history of paroxysmal atrial fibrillation not on anticoagulation due to GI bleeding, history of gastric AVMs, hypertension, hyperlipidemia presents to the ER today with onset of bloody stools around 7 PM on 07/27/2021.  Patient been admitted to the hospital in May 2023 for GI bleed.  Hemoglobin was stabilized.  He did not undergo endoscopy.  Last endoscopy was in 2021.  At that time he had enteroscopy.  He had gastric AVM that was cauterized with argon plasma laser.  Patient has been maintained on aspirin for his anticoagulation for his paroxysmal atrial fibrillation.  Fortunately, the patient has been on iron since he was discharged from the hospital in May 2023.  When he was discharged in May 2023, his discharge hemoglobin was 8.6 g/dL.  Today hemoglobin is 11.3 g/dL.  Patient denies any chest pain or shortness of breath.  Denies any abdominal pain.  Hemoccult was positive but the patient is on iron.  Patient states he has had both bloody and black stools today.  [facility] hospitalist contacted for admission.  Patient was a DNR in May 2023.  Patient is elected to be a full code this admission.

## 2021-07-28 NOTE — Assessment & Plan Note (Signed)
Stable. On coreg 3.125 mg bid.

## 2021-07-28 NOTE — ED Provider Notes (Signed)
[place] DEPT Provider Note   CSN: [id number] Arrival date & time: 07/27/21  2144     History  Chief Complaint  Patient presents with   Rectal Bleeding    [name] is a 88 y.o. male.  The history is provided by the patient.  Rectal Bleeding Quality:  Black and tarry and bright red Amount:  Scant Chronicity:  New Similar prior episodes: yes   Worsened by:  Nothing Associated symptoms: no abdominal pain and no dizziness   Risk factors: no anticoagulant use    Patient with history of CVA, previous GI bleed presents with rectal bleeding.  Patient reports he was otherwise at his baseline health when he wiped after a bowel movement and noted blood on the toilet paper.  He reports it was black and also bright red blood.  No abdominal pain, no dizziness.  He takes aspirin, but no anticoagulants.    Past Medical History:  Diagnosis Date   Acute CVA (cerebrovascular accident) ([place]) 07/25/2015   Asthma    Benign essential HTN    Cerebral infarction due to embolism of left middle cerebral artery ([place]) 02/03/2014   Chronic combined systolic and diastolic CHF (congestive heart failure) ([place]) 12/23/2017   Colitis    Colon polyps    Diabetes mellitus type 2, diet-controlled ([place]) 12/22/2013   Diverticulosis    GERD (gastroesophageal reflux disease)    GI bleed    GI bleed    Hemorrhoids    Hiatal hernia    History of esophageal strciture    HLD (hyperlipidemia)    Osteoarthritis    Proctitis    Status post dilation of esophageal narrowing    Stroke [facility]     Home Medications Prior to Admission medications   Medication Sig Start Date End Date Taking? Authorizing Provider  acetaminophen (TYLENOL) 325 MG tablet Take 2 tablets (650 mg total) by mouth every 6 (six) hours as needed for mild pain, fever or headache. Patient taking differently: Take 650 mg by mouth 2 (two) times daily. 03/12/21 03/12/22 Yes Elgergawy, Silver Huguenin, MD  albuterol  (VENTOLIN HFA) 108 (90 Base) MCG/ACT inhaler Inhale 2 puffs into the lungs every 6 (six) hours as needed for wheezing or shortness of breath.   Yes [provider]  aspirin EC 81 MG tablet Take 1 tablet (81 mg total) by mouth daily. Swallow whole. 06/28/21 06/28/22 Yes Gonfa, Charlesetta Ivory, MD  budesonide (PULMICORT) 0.5 MG/2ML nebulizer solution Take 2 mLs by nebulization daily. 51m by nebulization daily And 224mby nebulization daily as needed for shortness of breath/wheezing 09/01/18  Yes [provider]  carvedilol (COREG) 3.125 MG tablet Take 1 tablet (3.125 mg total) by mouth 2 (two) times daily with a meal. 03/12/21   Elgergawy, DaSilver HugueninMD  cholecalciferol (VITAMIN D) 25 MCG (1000 UNIT) tablet Take 1,000 Units by mouth daily.    [provider]  Cyanocobalamin (VITAMIN B-12 PO) Take 1,000 mcg by mouth daily.    [provider]  divalproex (DEPAKOTE SPRINKLE) 125 MG capsule Take 500 mg by mouth at bedtime. 4 caps per night    [provider]  ferrous sulfate 325 (65 FE) MG tablet Take 325 mg by mouth 2 (two) times daily.    [provider]  fluticasone (FLONASE) 50 MCG/ACT nasal spray Place 1 spray into both nostrils daily as needed for allergies or rhinitis.  06/18/14   [provider]  Magnesium 300 MG CAPS Take 300 mg by  mouth daily.    [provider]  Melatonin 10 MG CAPS Take 10 mg by mouth at bedtime.    [provider]  metFORMIN (GLUCOPHAGE) 500 MG tablet Take 500 mg by mouth daily. 08/23/19   [provider]  pantoprazole (PROTONIX) 40 MG tablet Take 1 tablet (40 mg total) by mouth daily with breakfast. 02/04/21   Esterwood, Amy S, PA-C  polyethylene glycol (MIRALAX / GLYCOLAX) 17 g packet Take 17 g by mouth daily.    [provider]  polyvinyl alcohol (LIQUIFILM TEARS) 1.4 % ophthalmic solution Place 1 drop into both eyes as needed for dry eyes.    [provider]  predniSONE (DELTASONE) 10  MG tablet Take 5 mg by mouth daily.  07/11/19   [provider]  psyllium (METAMUCIL) 58.6 % packet Take 1 packet by mouth daily.    [provider]  rOPINIRole (REQUIP) 4 MG tablet Take 1 tablet (4 mg total) by mouth at bedtime. 05/12/21   Frann Rider, NP  simvastatin (ZOCOR) 40 MG tablet Take 1 tablet (40 mg total) by mouth daily. Patient taking differently: Take 40 mg by mouth at bedtime. 02/26/16   Amin, Jeanella Flattery, MD  traMADol (ULTRAM) 50 MG tablet Take 1 tablet (50 mg total) by mouth daily as needed for moderate pain. 03/12/21   Elgergawy, Silver Huguenin, MD      Allergies    Tape    Review of Systems   Review of Systems  Gastrointestinal:  Positive for hematochezia. Negative for abdominal pain.  Neurological:  Negative for dizziness.    Physical Exam Updated Vital Signs BP 137/66   Pulse 65   Temp 98.9 F (37.2 C) (Oral)   Resp 20   Ht 1.778 m ('5\' 10"'$ )   Wt 68 kg   SpO2 97%   BMI 21.52 kg/m  Physical Exam CONSTITUTIONAL: Elderly, no acute distress HEAD: Normocephalic/atraumatic EYES: EOMI ENMT: Mucous membranes moist NECK: supple no meningeal signs CV: S1/S2 noted LUNGS: Lungs are clear to auscultation bilaterally, no apparent distress ABDOMEN: soft, nontender Rectal-nurse present, no bright red blood, black stool noted, Hemoccult positive NEURO: Pt is awake/alert/appropriate, moves all extremitiesx4.  No facial droop.   EXTREMITIES: full ROM SKIN: warm, color normal PSYCH: no abnormalities of mood noted, alert and oriented to situation  ED Results / Procedures / Treatments   Labs (all labs ordered are listed, but only abnormal results are displayed) Labs Reviewed  CBC WITH DIFFERENTIAL/PLATELET - Abnormal; Notable for the following components:      Result Value   RBC 3.87 (*)    Hemoglobin 11.3 (*)    HCT 36.8 (*)    RDW 19.1 (*)    Monocytes Absolute 1.2 (*)    All other components within normal limits  COMPREHENSIVE METABOLIC PANEL -  Abnormal; Notable for the following components:   Glucose, Bld 117 (*)    BUN 29 (*)    Calcium 8.6 (*)    Total Protein 6.0 (*)    Albumin 3.3 (*)    All other components within normal limits  POC OCCULT BLOOD, ED - Abnormal; Notable for the following components:   Fecal Occult Bld POSITIVE (*)    All other components within normal limits    EKG None  Radiology No results found.  Procedures Procedures    Medications Ordered in ED Medications - No data to display  ED Course/ Medical Decision Making/ A&P Clinical Course as of 07/28/21 0118  Mon Jul 28, 2021  0107 Glucose(!): 117 Mild hyperglycemia [DW]  0107 Fecal Occult Blood, POC(!): POSITIVE Positive for Hemoccult blood [DW]  0108 Hemoglobin(!): 11.3 Mild anemia [DW]  0114 Patient with previous history of diverticular bleed as well as gastric AVM.  He is high risk for worsening.  He has evidence of GI bleed tonight.  He will be admitted [DW]  0114 Discussed with Dr. Bridgett Larsson for admission [DW]    Clinical Course User Index [DW] Ripley Fraise, MD                           Medical Decision Making Amount and/or Complexity of Data Reviewed Labs:  Decision-making details documented in ED Course.  Risk Prescription drug management. Decision regarding hospitalization.   This patient presents to the ED for concern of rectal bleeding, this involves an extensive number of treatment options, and is a complaint that carries with it a high risk of complications and morbidity.  The differential diagnosis includes but is not limited to upper GI bleed, esophageal variceal bleeding, diverticular bleeding, hemorrhoidal bleeding, anal fissure  Comorbidities that complicate the patient evaluation: Patient's presentation is complicated by their history of CVA and previous GI bleed  Social Determinants of Health: Patient's resides in independent living increases the complexity of managing their presentation  Additional history  obtained: Records reviewed previous admission documents  Lab Tests: I Ordered, and personally interpreted labs.  The pertinent results include: Mild anemia, hyperglycemia, positive Hemoccult  Medicines ordered and prescription drug management: I ordered medication including Protonix for GI bleed    Critical Interventions:  Monitoring and admission  Consultations Obtained: I requested consultation with the admitting physician Dr. Bridgett Larsson , and discussed  findings as well as pertinent plan - they recommend: Admit  Reevaluation: After the interventions noted above, I reevaluated the patient and found that they have :stayed the same  Complexity of problems addressed: Patient's presentation is most consistent with  acute presentation with potential threat to life or bodily function  Disposition: After consideration of the diagnostic results and the patient's response to treatment,  I feel that the patent would benefit from admission   .           Final Clinical Impression(s) / ED Diagnoses Final diagnoses:  Rectal bleeding    Rx / DC Orders ED Discharge Orders     None         Ripley Fraise, MD 07/28/21 [phone]

## 2021-07-28 NOTE — Assessment & Plan Note (Signed)
Stable. Uses a walker at independent living.

## 2021-07-28 NOTE — Progress Notes (Addendum)
The patient was admitted early this morning with lower GI bleed.  GI consulted due to ongoing rectal bleed.  Serial H&H with hemoglobin of 9.6 K from 11.3 K on admission.  Denies having any significant abdominal pain.  No nausea.  No dyspnea or anginal symptoms.  Physical exam is benign.  Vital signs are stable, will transfuse hemoglobin less than 8.

## 2021-07-28 NOTE — Progress Notes (Signed)
2 large liquid bloody stools noted this am. MD notified.

## 2021-07-28 NOTE — Assessment & Plan Note (Signed)
Admit to med/surg bed. Hx of gastric AVMs. Start IV protonix 40 mg bid. Serial H&H q6h. Clear liquid diet.  GI bleeding is a Acute illness/condition that poses a threat to life or bodily function.

## 2021-07-28 NOTE — Consult Note (Signed)
Consultation  Referring Provider:  [facility]/ [place] Crane DO Primary Care Physician:  Seward Carol, MD Primary Gastroenterologist:  Dr. Ardis Hughs  Reason for Consultation: GI bleed  HPI: [name] is a 88 y.o. male, known to Dr. Ardis Hughs, who has history of recurrent GI bleeding.  Patient presented to the emergency room last night after acute onset of melenic appearing stool x1.  This was not associated with syncope, presyncope chest pain or shortness of breath. He had a very recent admission 5/7 through 06/25/2021 with acute GI bleed and was seen by GI during that admission.  That episode was associated with syncope.  This episode was presumed diverticular as he has had several diverticular bleeds in the past.  He did not have any endoscopic evaluation.  Hemoglobin dropped from 12-8.6.  He did not require transfusion. He resumed baby aspirin on discharge and says he had been feeling fine, with no signs of any GI bleeding till acute onset last evening.  Has been hemodynamically stable, hemoglobin 11.3 on admission/BUN 29/creatinine 0.67 This morning hemoglobin down to 10.5/hematocrit 34.5 He has not required transfusion.  He was seen by palliative care during his last admission, and per discussions with the patient and his wife decision was made not to pursue any further invasive work-ups, and specifically decided no further endoscopic evaluation in event of repeated bleeds. He was made DNR/DNI at that time however it appears on admission last evening that he was made a full code. I discussed this with them this morning, he still wishes to be DNR/no CPR but in the event of an acute illness episode syncope etc. he does want to be supported, and would not be opposed to intubation very briefly would not desire any prolonged intubation.  Patient has had 1 bowel movement overnight and one bowel movement this morning more grossly bloody by notes. Last EGD January 2023 with tight cricopharyngeus unable to  intubate and subsequent barium swallow showed significant dysmotility with incomplete esophageal clearing, partially obstructive distal esophageal ring, moderate size paraesophageal hernia.  Last colonoscopy was done in 2018 noted to have multiple left colon diverticuli, right colon less than optimal prep.  He also has history of AVMs which have been treated with APC Last enteroscopy August 2022 with a benign stricture at the GE junction, and 1 nonbleeding AVM at the incisura which was treated with APC. Enteroscopy in December 2021 with finding of 2 small red spots in the duodenum which were treated with APC and 2 tiny AVMs in the jejunum treated with APC.  Other medical issues include history of previous CVA with left hemiparesis, congestive heart failure, last echo showed EF of 55 to 60%, hypertension, he is status post remote Nissen fundoplication.  Also with prior history of adenomatous colon polyps, diabetes mellitus.   Past Medical History:  Diagnosis Date   Acute CVA (cerebrovascular accident) ([place]) 07/25/2015   Asthma    Benign essential HTN    Cerebral infarction due to embolism of left middle cerebral artery ([place]) 02/03/2014   Chronic combined systolic and diastolic CHF (congestive heart failure) ([place]) 12/23/2017   Colitis    Colon polyps    Diabetes mellitus type 2, diet-controlled ([place]) 12/22/2013   Diverticulosis    GERD (gastroesophageal reflux disease)    GI bleed    GI bleed    Hemorrhoids    Hiatal hernia    History of esophageal strciture    HLD (hyperlipidemia)    Osteoarthritis    Proctitis  Status post dilation of esophageal narrowing    Stroke [facility])     Past Surgical History:  Procedure Laterality Date   ABDOMINAL HERNIA REPAIR     BIOPSY  04/05/2019   Procedure: BIOPSY;  Surgeon: Thornton Mott, MD;  Location: [place];  Service: Gastroenterology;;   COLONOSCOPY     multiple   COLONOSCOPY WITH PROPOFOL N/A 12/12/2016   Procedure: COLONOSCOPY WITH  PROPOFOL;  Surgeon: Gatha Mayer, MD;  Location: [place];  Service: Endoscopy;  Laterality: N/A;   COLONOSCOPY WITH PROPOFOL N/A 04/04/2019   Procedure: COLONOSCOPY WITH PROPOFOL;  Surgeon: Milus Banister, MD;  Location: [facility] ENDOSCOPY;  Service: Endoscopy;  Laterality: N/A;   ENTEROSCOPY N/A 01/16/2020   Procedure: ENTEROSCOPY;  Surgeon: Rush Landmark Telford Nab., MD;  Location: [facility];  Service: Gastroenterology;  Laterality: N/A;   ENTEROSCOPY N/A 01/20/2020   Procedure: ENTEROSCOPY;  Surgeon: Rush Landmark Telford Nab., MD;  Location: [facility];  Service: Gastroenterology;  Laterality: N/A;   ENTEROSCOPY N/A 09/16/2020   Procedure: ENTEROSCOPY;  Surgeon: Ladene Artist, MD;  Location: [facility] ENDOSCOPY;  Service: Endoscopy;  Laterality: N/A;  push enteroscopy   ESOPHAGOGASTRODUODENOSCOPY     multiple   ESOPHAGOGASTRODUODENOSCOPY (EGD) WITH PROPOFOL N/A 04/05/2019   Procedure: ESOPHAGOGASTRODUODENOSCOPY (EGD) WITH PROPOFOL;  Surgeon: Thornton Jago, MD;  Location: [place];  Service: Gastroenterology;  Laterality: N/A;   ESOPHAGOGASTRODUODENOSCOPY (EGD) WITH PROPOFOL N/A 01/18/2020   Procedure: ESOPHAGOGASTRODUODENOSCOPY (EGD) WITH PROPOFOL;  Surgeon: Doran Stabler, MD;  [facility]: [facility] ENDOSCOPY;  Service: Gastroenterology;  Laterality: N/A;  For placement of video capsule study   GIVENS CAPSULE STUDY N/A 01/16/2020   Procedure: [place];  Surgeon: Irving Copas., MD;  Location[facility] ENDOS[facility];  Service: Gastroenterology;  Laterality: N/A;   GIVENS CAPSULE STUDY N/A 01/18/2020   Procedure: GIVENS CAPSULE STUDY;  Surgeon: Doran Stabler, MD;  [facility];  Service: Gastroenterology;  Laterality: N/A;   HOT HEMOSTASIS N/A 01/20/2020   Procedure: HOT HEMOSTASIS (ARGON PLASMA COAGULATION/BICAP);  Surgeon: Irving Copas., MD;  Location: [facility];  Service: Gastroenterology;  Laterality: N/A;   HOT HEMOSTASIS N/A 09/16/2020   Procedure: HOT  HEMOSTASIS (ARGON PLASMA COAGULATION/BICAP);  Surgeon: Ladene Artist, MD;  Location: [facility];  Service: Endoscopy;  Laterality: N/A;   INSERTION OF MESH  01/29/2012   Procedure: INSERTION OF MESH;  Surgeon: Gwenyth Ober, MD;  Location: [place];  Service: General;  Laterality: N/A;   IR ANGIOGRAM SELECTIVE EACH ADDITIONAL VESSEL  04/01/2019   IR ANGIOGRAM VISCERAL SELECTIVE  04/01/2019   IR ANGIOGRAM VISCERAL SELECTIVE  04/01/2019   IR ANGIOGRAM VISCERAL SELECTIVE  04/01/2019   IR US GUIDE VASC ACCESS RIGHT  9/[id number]/[id number]   NISSEN FUNDOPLICATION     SAVORY DILATION N/A 01/16/2020   Procedure: SAVORY DILATION;  Surgeon: Irving Copas., MD;  [facility];  Service: Gastroenterology;  Laterality: N/A;   SAVORY DILATION N/A 09/16/2020   Procedure: SAVORY DILATION;  Surgeon: Ladene Artist, MD;  Location[facility];  Service: Endoscopy;  Laterality: N/A;   SCLEROTHERAPY  01/20/2020   Procedure: SCLEROTHERAPY;  Surgeon: Mansouraty, Telford Nab., MD;  Location: [facility];  Service: Gastroenterology;;   SPLENECTOMY, TOTAL     nontraumatic rupture   SUBMUCOSAL TATTOO INJECTION  01/16/2020   Procedure: SUBMUCOSAL TATTOO INJECTION;  Surgeon: Irving Copas., MD;  Location: [facility];  Service: Gastroenterology;;   VENTRAL HERNIA REPAIR  01/29/2012    WITH MESH   VENTRAL HERNIA REPAIR  01/29/2012   Procedure: HERNIA REPAIR VENTRAL ADULT;  Surgeon: Gwenyth Ober, MD;  Location: [place];  Service: General;  Laterality: N/A;  open recurrent ventral hernia repair with mesh    Prior to Admission medications   Medication Sig Start Date End Date Taking? Authorizing Provider  acetaminophen (TYLENOL) 325 MG tablet Take 2 tablets (650 mg total) by mouth every 6 (six) hours as needed for mild pain, fever or headache. Patient taking differently: Take 650 mg by mouth 2 (two) times daily. 03/12/21 03/12/22 Yes Elgergawy, Silver Huguenin, MD  albuterol (VENTOLIN HFA) 108 (90 Base) MCG/ACT  inhaler Inhale 2 puffs into the lungs every 6 (six) hours as needed for wheezing or shortness of breath.   Yes [provider]  aspirin EC 81 MG tablet Take 1 tablet (81 mg total) by mouth daily. Swallow whole. 06/28/21 06/28/22 Yes Gonfa, Charlesetta Ivory, MD  budesonide (PULMICORT) 0.5 MG/2ML nebulizer solution Take 2 mLs by nebulization See admin instructions. Inhale 2 ml via nebulizer daily. May repeat once if needed. 09/01/18  Yes [provider]  carvedilol (COREG) 3.125 MG tablet Take 1 tablet (3.125 mg total) by mouth 2 (two) times daily with a meal. 03/12/21  Yes Elgergawy, Silver Huguenin, MD  Cholecalciferol (VITAMIN D-3 PO) Take 1 tablet by mouth in the morning.   Yes [provider]  divalproex (DEPAKOTE SPRINKLE) 125 MG capsule Take 500 mg by mouth at bedtime.   Yes [provider]  ferrous sulfate 325 (65 FE) MG tablet Take 325 mg by mouth 2 (two) times daily.   Yes [provider]  MELATONIN PO Take 1 tablet by mouth daily.   Yes [provider]  metFORMIN (GLUCOPHAGE) 500 MG tablet Take 500 mg by mouth daily. 08/23/19  Yes [provider]  pantoprazole (PROTONIX) 40 MG tablet Take 1 tablet (40 mg total) by mouth daily with breakfast. 02/04/21  Yes Nyree Applegate S, PA-C  polyethylene glycol (MIRALAX / GLYCOLAX) 17 g packet Take 17 g by mouth daily.   Yes [provider]  predniSONE (DELTASONE) 10 MG tablet Take 5 mg by mouth daily.  07/11/19  Yes [provider]  psyllium (METAMUCIL) 58.6 % packet Take 1 packet by mouth daily.   Yes [provider]  rOPINIRole (REQUIP) 3 MG tablet Take 3 mg by mouth at bedtime.   Yes [provider]  simvastatin (ZOCOR) 40 MG tablet Take 1 tablet (40 mg total) by mouth daily. Patient taking differently: Take 40 mg by mouth at bedtime. 02/26/16  Yes Amin, Ankit Chirag, MD  sodium chloride (OCEAN) 0.65 % SOLN nasal spray Place 1 spray into both nostrils 2 (two) times daily as  needed (dryness).   Yes [provider]  traMADol (ULTRAM) 50 MG tablet Take 1 tablet (50 mg total) by mouth daily as needed for moderate pain. Patient taking differently: Take 50 mg by mouth at bedtime. 03/12/21  Yes Elgergawy, Silver Huguenin, MD  gabapentin (NEURONTIN) 300 MG capsule Take 300 mg by mouth 2 (two) times daily. Patient not taking: Reported on 07/28/2021 07/26/21   [provider]  rOPINIRole (REQUIP) 4 MG tablet Take 1 tablet (4 mg total) by mouth at bedtime. Patient not taking: Reported on 07/28/2021 05/12/21   Frann Rider, NP  Tafluprost, PF, 0.0015 % SOLN Place 1 drop into both eyes daily. Patient not taking: Reported on 07/28/2021 06/25/21   [provider]  timolol (TIMOPTIC) 0.25 % ophthalmic solution Place 1 drop into both eyes  2 (two) times daily. Patient not taking: Reported on 07/28/2021 07/06/21   [provider]    Current Facility-Administered Medications  Medication Dose Route Frequency Provider Last Rate Last Admin   acetaminophen (TYLENOL) tablet 650 mg  650 mg Oral Q6H PRN Kristopher Oppenheim, DO       Or   acetaminophen (TYLENOL) suppository 650 mg  650 mg Rectal Q6H PRN Kristopher Oppenheim, DO       albuterol (PROVENTIL) (2.5 MG/3ML) 0.083% nebulizer solution 2.5 mg  2.5 mg Nebulization Q2H PRN Kristopher Oppenheim, DO       carvedilol (COREG) tablet 3.125 mg  3.125 mg Oral BID WC Kristopher Oppenheim, DO   3.125 mg at 07/28/21 0165   divalproex (DEPAKOTE SPRINKLE) capsule 500 mg  500 mg Oral QHS Kristopher Oppenheim, DO       melatonin tablet 10 mg  10 mg Oral QHS Kristopher Oppenheim, DO       ondansetron [facility]) tablet 4 mg  4 mg Oral Q6H PRN Kristopher Oppenheim, DO       Or   ondansetron [facility]) injection 4 mg  4 mg Intravenous Q6H PRN Kristopher Oppenheim, DO       pantoprazole (PROTONIX) injection 40 mg  40 mg Intravenous Q12H Kristopher Oppenheim, DO   40 mg at 07/28/21 0949   rOPINIRole (REQUIP) tablet 4 mg  4 mg Oral QHS Kristopher Oppenheim, DO       simvastatin (ZOCOR) tablet 40 mg  40 mg Oral QHS Kristopher Oppenheim, DO         Allergies as of 07/27/2021 - Review Complete 07/27/2021  Allergen Reaction Noted   Tape Other (See Comments) 02/04/2018    Family History  Problem Relation Age of Onset   Alzheimer's disease Mother    Breast cancer Mother    Throat cancer Father    Colon cancer Neg Hx    Colon polyps Neg Hx    Pancreatic cancer Neg Hx    Stomach cancer Neg Hx    Liver disease Neg Hx    Esophageal cancer Neg Hx    Rectal cancer Neg Hx     Social History   Socioeconomic History   Marital status: Married    Spouse name: [name]   Number of children: 2   Years of education: Bachelor's   Highest education level: Not on file  Occupational History   Occupation: retired  Tobacco Use   Smoking status: Never   Smokeless tobacco: Never  Vaping Use   Vaping Use: Never used  Substance and Sexual Activity   Alcohol use: Yes    Alcohol/week: 2.0 standard drinks of alcohol    Types: 2 Glasses of wine per week    Comment: daily   Drug use: No   Sexual activity: Not Currently  Other Topics Concern   Not on file  Social History Narrative   Patient is married and has 2 children.   Patient is right handed.   Patient has Bachelor's degree.   Patient drinks 2 cups daily.   Social Determinants of Health   Financial Resource Strain: Not on file  Food Insecurity: Not on file  Transportation Needs: Not on file  Physical Activity: Not on file  Stress: Not on file  Social Connections: Not on file  Intimate Partner Violence: Not on file    Review of Systems: Pertinent positive and negative review of systems were noted in the above HPI section.  All other review of systems was otherwise negative.  Physical Exam: Vital signs in last 24 hours: Temp:  [97.5 F (36.4 C)-98.9 F (37.2 C)] 97.5 F (36.4 C) (06/12 0856) Pulse Rate:  [58-93] 93 (06/12 0856) Resp:  [16-20] 17 (06/12 0856) BP: (109-166)/(66-128) 109/78 (06/12 0856) SpO2:  [96 %-100 %] 99 % (06/12 0856) Weight:  [68 kg] 68 kg  (06/11 2149) Last BM Date : 07/28/21 General:   Alert,  Well-developed, very elderly white male well-nourished, pleasant and cooperative in NAD, patient's wife and a caregiver at bedside Head:  Normocephalic and atraumatic. Eyes:  Sclera clear, no icterus.   Conjunctiva pink. Ears:  Normal auditory acuity. Nose:  No deformity, discharge,  or lesions. Mouth:  No deformity or lesions.   Neck:  Supple; no masses or thyromegaly. Lungs:  Clear throughout to auscultation.   No wheezes, crackles, or rhonchi.  Heart:  Regular rate and rhythm; no murmurs, clicks, rubs,  or gallops. Abdomen:  Soft,nontender, BS somewhat hyperactive nonpalp mass or hsm.   Rectal: Not done, documented tarry melenic appearing stool per ER MD Msk:  Symmetrical without gross deformities. . Pulses:  Normal pulses noted. Extremities:  Without clubbing or edema. Neurologic:  Alert and  oriented x4;  grossly normal neurologically. Skin:  Intact without significant lesions or rashes.. Psych:  Alert and cooperative. Normal mood and affect.  Intake/Output from previous day: 06/11 0701 - 06/12 0700 In: -  Out: 350 [Urine:350] Intake/Output this shift: No intake/output data recorded.  Lab Results: Recent Labs    07/27/21 2217 07/28/21 0725  WBC 10.3  --   HGB 11.3* 10.5*  HCT 36.8* 34.5*  PLT 362  --    BMET Recent Labs    07/27/21 2217  NA 139  K 4.5  CL 106  CO2 28  GLUCOSE 117*  BUN 29*  CREATININE 0.67  CALCIUM 8.6*   LFT Recent Labs    07/27/21 2217  PROT 6.0*  ALBUMIN 3.3*  AST 15  ALT 13  ALKPHOS 47  BILITOT 0.3   PT/INR No results for input(s): "LABPROT", "INR" in the last 72 hours. Hepatitis Panel No results for input(s): "HEPBSAG", "HCVAB", "HEPAIGM", "HEPBIGM" in the last 72 hours.   IMPRESSION:  #20 88 year old white male with acute recurrent GI bleeding.  Very recent admission 5/7 through 06/25/2021 for presumed diverticular hemorrhage which was managed with supportive care, and  no transfusion requirement.  Onset last evening with recurrent bleeding, initially melenic appearing stool, now having more bloody appearing stool Note mildly elevated BUN on admission  Etiology of recurrent bleeding is not entirely clear, consider bleeding secondary to AVMs, has previously had duodenal, jejunal and 1 gastric AVM all treated with APC, consider recurrent diverticular hemorrhage  Decision was made during his last admission not to pursue any further endoscopic evaluation for these recurrent bleeds, wishes to be treated supportively  #2 anemia acute on chronic secondary to recurrent bleed #3 history of congestive heart failure EF 55% #4 status post prior CVA with left hemiparesis #5.  Hypertension #6.  Status post remote Nissen fundoplication #7 chronic dysphagia-combination of esophageal dysmotility, and distal esophageal ring as well as moderate paraesophageal hernia  PLAN: Full liquid diet Serial hemoglobins every 6 hours and transfuse for hemoglobin 8 or less given advanced age and history of syncope with these episodes Hold baby aspirin No plans for endoscopic intervention, I do not think he is bleeding actively enough to warrant CT angiography at present. GI will follow along with you  Would ask hospitalist to confirm patient's  wishes regarding , no CPR, etc   Dondi Burandt PA-C 07/28/2021, 11:04 AM

## 2021-07-28 NOTE — Assessment & Plan Note (Signed)
Stable. Add SSI. 

## 2021-07-28 NOTE — Assessment & Plan Note (Signed)
Stable. Continue zocor 40 mg daily.

## 2021-07-29 LAB — PREPARE RBC (CROSSMATCH)

## 2021-07-29 LAB — HEMOGLOBIN AND HEMATOCRIT, BLOOD
HCT: 25.9 % — ABNORMAL LOW (ref 39.0–52.0)
HCT: 26.1 % — ABNORMAL LOW (ref 39.0–52.0)
HCT: 29.4 % — ABNORMAL LOW (ref 39.0–52.0)
Hemoglobin: 7.9 g/dL — ABNORMAL LOW (ref 13.0–17.0)
Hemoglobin: 8.1 g/dL — ABNORMAL LOW (ref 13.0–17.0)
Hemoglobin: 9.1 g/dL — ABNORMAL LOW (ref 13.0–17.0)

## 2021-07-29 MED ORDER — SALINE SPRAY 0.65 % NA SOLN
1.0000 | Freq: Two times a day (BID) | NASAL | Status: DC | PRN
Start: 2021-07-29 — End: 2021-07-31

## 2021-07-29 MED ORDER — MELATONIN 5 MG PO TABS: 5.0000 mg | ORAL_TABLET | Freq: Every evening | ORAL | Status: DC | PRN

## 2021-07-29 MED ORDER — SODIUM CHLORIDE 0.9% IV SOLUTION: Freq: Once | INTRAVENOUS | Status: AC

## 2021-07-29 MED ORDER — SODIUM CHLORIDE 0.9 % IV SOLN: INTRAVENOUS | Status: AC

## 2021-07-29 MED ORDER — PANTOPRAZOLE SODIUM 40 MG PO TBEC
40.0000 mg | DELAYED_RELEASE_TABLET | Freq: Every day | ORAL | Status: DC
  Administered 2021-07-30 – 2021-07-31 (×2): 40 mg via ORAL
  Filled 2021-07-29 (×2): qty 1

## 2021-07-29 NOTE — Progress Notes (Signed)
PROGRESS NOTE  [name] JSH:[id number] DOB: 1931/06/20 DOA: 07/27/2021 PCP: Seward Carol, MD  HPI/Recap of past 60 hours: 88 year old male history of recurrent diverticular bleed, type 2 diabetes, paroxysmal atrial fibrillation not on anticoagulation due to GI bleed, on aspirin, history of gastric AVMs, hypertension, hyperlipidemia who presented with complaints of sudden onset recurrent lower GI bleed, hematochezia around 7 PM on 07/27/2021.  Admitted for lower GI bleed requiring blood transfusion, received 1 unit PRBC on 07/29/2021.  GI is following.  07/29/2021: Reports persistent lower GI bleed but improved compared to yesterday.  No significant abdominal pain.  No nausea.  Assessment/Plan: Principal Problem:   GI bleeding Active Problems:   Diabetes mellitus type II, non insulin dependent (HCC)   Seizures (HCC)   HLD (hyperlipidemia)   Benign essential HTN   PAF (paroxysmal atrial fibrillation) (HCC)   Chronic combined CHF with recovered EF   Hemiparesis affecting left side as late effect of cerebrovascular accident (CVA) ([place])  Hematochezia/presumed recurrent diverticular bleed Hx of gastric AVMs and history of diverticular bleed.  Transfuse hemoglobin less than 8.0 in the setting of ongoing GI bleed. 1 unit PRBC transfused on 07/29/2021 for hemoglobin of 7.9.   The patient has declined further endoscopic evaluation due to advanced age and comorbidities.   Continue supportive care.  Advance diet as tolerated  Chronic combined CHF with recovered EF Stable. Euvolemic. Strict I's and O's and daily weight   PAF (paroxysmal atrial fibrillation) (HCC) Stable. Not on systemic anticoagulation due to GI bleeding Continue to hold ASA.   Benign essential HTN Stable. On coreg 3.125 mg bid.   HLD (hyperlipidemia) Stable. Continue zocor 40 mg daily.   Seizures (HCC) Stable. Continue depakote 500 mg qhs.   Prediabetes Last hemoglobin A1c 6.3 on 03/09/2021 Stable.     Hemiparesis affecting left side as late effect of cerebrovascular accident (CVA) ([place]) Stable. Uses a walker at independent living. PT OT to assess Fall precautions  Restless leg syndrome Resume home regimen     Code Status: Full code  Family Communication: None at bedside  Disposition Plan: Likely will discharge to home once GI signs off.   Consultants: GI  Procedures: None  Antimicrobials: None.  DVT prophylaxis: SCDs  Status is: Inpatient The patient requires at least 2 midnights for further evaluation and treatment of present condition.    Objective: Vitals:   07/29/21 0333 07/29/21 0334 07/29/21 0410 07/29/21 0627  BP: 126/86  109/79 125/78  Pulse: 88 89 81 82  Resp: '18  18 18  '$ Temp: 97.7 F (36.5 C)  97.8 F (36.6 C) 97.7 F (36.5 C)  TempSrc: Oral  Oral Oral  SpO2: 100% 99% 98% 98%  Weight:      Height:        Intake/Output Summary (Last 24 hours) at 07/29/2021 1237 Last data filed at 07/29/2021 0855 Gross per 24 hour  Intake 511.5 ml  Output 400 ml  Net 111.5 ml   Filed Weights   07/27/21 2149  Weight: 68 kg    Exam:  General: 88 y.o. year-old male well developed well nourished in no acute distress.  Alert and oriented x3. Cardiovascular: Regular rate and rhythm with no rubs or gallops.  No thyromegaly or JVD noted.   Respiratory: Clear to auscultation with no wheezes or rales. Good inspiratory effort. Abdomen: Soft nontender nondistended with normal bowel sounds x4 quadrants. Musculoskeletal: No lower extremity edema. 2/4 pulses in all 4 extremities. Skin: No ulcerative lesions noted or rashes,  Psychiatry: Mood is appropriate for condition and setting   Data Reviewed: CBC: Recent Labs  Lab 07/27/21 2217 07/28/21 0725 07/28/21 1143 07/28/21 1758 07/29/21 0015 07/29/21 0911  WBC 10.3  --   --   --   --   --   NEUTROABS 6.9  --   --   --   --   --   HGB 11.3* 10.5* 9.6* 8.7* 7.9* 9.1*  HCT 36.8* 34.5* 31.6* 28.9* 25.9* 29.4*   MCV 95.1  --   --   --   --   --   PLT 362  --   --   --   --   --    Basic Metabolic Panel: Recent Labs  Lab 07/27/21 2217  NA 139  K 4.5  CL 106  CO2 28  GLUCOSE 117*  BUN 29*  CREATININE 0.67  CALCIUM 8.6*   GFR: Estimated Creatinine Clearance: 62.6 mL/min (by C-G formula based on SCr of 0.67 mg/dL). Liver Function Tests: Recent Labs  Lab 07/27/21 2217  AST 15  ALT 13  ALKPHOS 47  BILITOT 0.3  PROT 6.0*  ALBUMIN 3.3*   No results for input(s): "LIPASE", "AMYLASE" in the last 168 hours. No results for input(s): "AMMONIA" in the last 168 hours. Coagulation Profile: No results for input(s): "INR", "PROTIME" in the last 168 hours. Cardiac Enzymes: No results for input(s): "CKTOTAL", "CKMB", "CKMBINDEX", "TROPONINI" in the last 168 hours. BNP (last 3 results) No results for input(s): "PROBNP" in the last 8760 hours. HbA1C: No results for input(s): "HGBA1C" in the last 72 hours. CBG: No results for input(s): "GLUCAP" in the last 168 hours. Lipid Profile: No results for input(s): "CHOL", "HDL", "LDLCALC", "TRIG", "CHOLHDL", "LDLDIRECT" in the last 72 hours. Thyroid Function Tests: No results for input(s): "TSH", "T4TOTAL", "FREET4", "T3FREE", "THYROIDAB" in the last 72 hours. Anemia Panel: No results for input(s): "VITAMINB12", "FOLATE", "FERRITIN", "TIBC", "IRON", "RETICCTPCT" in the last 72 hours. Urine analysis:    Component Value Date/Time   COLORURINE STRAW (A) 03/08/2021 1919   APPEARANCEUR CLEAR 03/08/2021 1919   LABSPEC 1.006 03/08/2021 1919   PHURINE 6.0 03/08/2021 1919   GLUCOSEU NEGATIVE 03/08/2021 1919   HGBUR NEGATIVE 03/08/2021 [place] NEGATIVE 03/08/2021 [place] NEGATIVE 03/08/2021 1919   PROTEINUR NEGATIVE 03/08/2021 1919   UROBILINOGEN 0.2 12/22/2013 1210   NITRITE NEGATIVE 03/08/2021 1919   LEUKOCYTESUR NEGATIVE 03/08/2021 1919   Sepsis Labs: '@LABRCNTIP'$ (procalcitonin:4,lacticidven:4)  )No results found for this or  any previous visit (from the past 240 hour(s)).    Studies: No results found.  Scheduled Meds:  carvedilol  3.125 mg Oral BID WC   divalproex  500 mg Oral QHS   melatonin  10 mg Oral QHS   [START ON 07/30/2021] pantoprazole  40 mg Oral Q breakfast   rOPINIRole  4 mg Oral QHS   simvastatin  40 mg Oral QHS    Continuous Infusions:   LOS: 1 day     Kayleen Memos, MD [facility] Pager [phone]  If 7PM-7AM, please contact night-coverage [email or web address] Password [facility] 07/29/2021, 12:37 PM

## 2021-07-29 NOTE — Progress Notes (Signed)
Patient ID: [name], male   DOB: 1931-04-06, 88 y.o.   MRN: [id number]    Progress Note   Subjective   Day # 3  CC; recurrent GI bleeding  Hemoglobin down to 7.9 earlier this a.m.-transfused 1 unit and hemoglobin up to 9.1  Patient says he feels okay, had a rough night last night did not get any sleep. He has had 2 small bowel movements this morning, nurse reports small amount and melenic appearing no gross blood.   Objective   Vital signs in last 24 hours: Temp:  [97.5 F (36.4 C)-98.4 F (36.9 C)] 97.7 F (36.5 C) (06/13 0627) Pulse Rate:  [78-93] 82 (06/13 0627) Resp:  [17-20] 18 (06/13 0627) BP: (95-126)/(69-86) 125/78 (06/13 0627) SpO2:  [97 %-100 %] 98 % (06/13 0627) Last BM Date : 07/29/21 General:   Very elderly white male in NAD Heart:  Regular rate and rhythm; no murmurs Lungs: Respirations even and unlabored, lungs CTA bilaterally Abdomen:  Soft, nontender and nondistended. Normal bowel sounds. Extremities:  Without edema. Neurologic:  Alert and oriented,  grossly normal neurologically. Psych:  Cooperative. Normal mood and affect.  Intake/Output from previous day: 06/12 0701 - 06/13 0700 In: 391.5 [Blood:391.5] Out: 400 [Urine:400] Intake/Output this shift: No intake/output data recorded.  Lab Results: Recent Labs    07/27/21 2217 07/28/21 0725 07/28/21 1143 07/28/21 1758 07/29/21 0015  WBC 10.3  --   --   --   --   HGB 11.3*   < > 9.6* 8.7* 7.9*  HCT 36.8*   < > 31.6* 28.9* 25.9*  PLT 362  --   --   --   --    < > = values in this interval not displayed.   BMET Recent Labs    07/27/21 2217  NA 139  K 4.5  CL 106  CO2 28  GLUCOSE 117*  BUN 29*  CREATININE 0.67  CALCIUM 8.6*   LFT Recent Labs    07/27/21 2217  PROT 6.0*  ALBUMIN 3.3*  AST 15  ALT 13  ALKPHOS 47  BILITOT 0.3   PT/INR No results for input(s): "LABPROT", "INR" in the last 72 hours.      Assessment / Plan:    #61 88 year old white male with acute recurrent  GI bleeding, history of recurrent diverticular bleeds.  He had very recent admission May 2023 with presumed diverticular hemorrhage, managed with supportive care and did not require transfusion.  Readmitted with repeat GI bleed, initially melenic appearing stool then more grossly bloody appearing stool yesterday.  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  Patient desires to be treated supportively and does not want any more endoscopic evaluation due to advanced age and comorbidities.  #2 anemia acute on chronic secondary to recurrent bleeding #3 congestive heart failure #4 history of CVA with left hemiparesis #5 History of hypertension  #6 status post remote Nissen fundoplication-chronic dysphagia felt to be component of dysmotility and distal esophageal ring and moderate paraesophageal hernia  Plan; will advance to solid diet today Continue serial hemoglobins and transfuse as needed, transfuse for hemoglobin 8 or less     Principal Problem:   GI bleeding Active Problems:   Diabetes mellitus type II,  non insulin dependent (HCC)   Seizures (HCC)   HLD (hyperlipidemia)   Benign essential HTN   PAF (paroxysmal atrial fibrillation) (HCC)   Chronic combined CHF with recovered EF   Hemiparesis affecting left side as late effect of cerebrovascular accident (CVA) ([place])     LOS: 1 day   Corie Allis PA-C 07/29/2021, 8:40 AM

## 2021-07-30 DIAGNOSIS — K921 Melena: Secondary | ICD-10-CM

## 2021-07-30 DIAGNOSIS — D500 Iron deficiency anemia secondary to blood loss (chronic): Secondary | ICD-10-CM

## 2021-07-30 LAB — CBC WITH DIFFERENTIAL/PLATELET
Abs Immature Granulocytes: 0.03 10*3/uL (ref 0.00–0.07)
Basophils Absolute: 0 10*3/uL (ref 0.0–0.1)
Basophils Relative: 0 %
Eosinophils Absolute: 0.2 10*3/uL (ref 0.0–0.5)
Eosinophils Relative: 3 %
HCT: 26.4 % — ABNORMAL LOW (ref 39.0–52.0)
Hemoglobin: 8.4 g/dL — ABNORMAL LOW (ref 13.0–17.0)
Immature Granulocytes: 0 %
Lymphocytes Relative: 24 %
Lymphs Abs: 2.2 10*3/uL (ref 0.7–4.0)
MCH: 28.3 pg (ref 26.0–34.0)
MCHC: 31.8 g/dL (ref 30.0–36.0)
MCV: 88.9 fL (ref 80.0–100.0)
Monocytes Absolute: 1.2 10*3/uL — ABNORMAL HIGH (ref 0.1–1.0)
Monocytes Relative: 14 %
Neutro Abs: 5.4 10*3/uL (ref 1.7–7.7)
Neutrophils Relative %: 59 %
Platelets: 225 10*3/uL (ref 150–400)
RBC: 2.97 MIL/uL — ABNORMAL LOW (ref 4.22–5.81)
RDW: 26.8 % — ABNORMAL HIGH (ref 11.5–15.5)
WBC: 9.1 10*3/uL (ref 4.0–10.5)
nRBC: 0 % (ref 0.0–0.2)

## 2021-07-30 LAB — CBC
HCT: 23.9 % — ABNORMAL LOW (ref 39.0–52.0)
Hemoglobin: 7.6 g/dL — ABNORMAL LOW (ref 13.0–17.0)
MCH: 30.2 pg (ref 26.0–34.0)
MCHC: 31.8 g/dL (ref 30.0–36.0)
MCV: 94.8 fL (ref 80.0–100.0)
Platelets: 228 10*3/uL (ref 150–400)
RBC: 2.52 MIL/uL — ABNORMAL LOW (ref 4.22–5.81)
RDW: 19.4 % — ABNORMAL HIGH (ref 11.5–15.5)
WBC: 11.3 10*3/uL — ABNORMAL HIGH (ref 4.0–10.5)
nRBC: 0 % (ref 0.0–0.2)

## 2021-07-30 LAB — COMPREHENSIVE METABOLIC PANEL
ALT: 13 U/L (ref 0–44)
AST: 13 U/L — ABNORMAL LOW (ref 15–41)
Albumin: 2.6 g/dL — ABNORMAL LOW (ref 3.5–5.0)
Alkaline Phosphatase: 33 U/L — ABNORMAL LOW (ref 38–126)
Anion gap: 3 — ABNORMAL LOW (ref 5–15)
BUN: 22 mg/dL (ref 8–23)
CO2: 27 mmol/L (ref 22–32)
Calcium: 7.6 mg/dL — ABNORMAL LOW (ref 8.9–10.3)
Chloride: 107 mmol/L (ref 98–111)
Creatinine, Ser: 0.72 mg/dL (ref 0.61–1.24)
GFR, Estimated: >60 mL/min (ref >60)
Glucose, Bld: 108 mg/dL — ABNORMAL HIGH (ref 70–99)
Potassium: 3.8 mmol/L (ref 3.5–5.1)
Sodium: 137 mmol/L (ref 135–145)
Total Bilirubin: 0.5 mg/dL (ref 0.3–1.2)
Total Protein: 4.5 g/dL — ABNORMAL LOW (ref 6.5–8.1)

## 2021-07-30 LAB — MAGNESIUM: Magnesium: 2 mg/dL (ref 1.7–2.4)

## 2021-07-30 LAB — PREPARE RBC (CROSSMATCH)

## 2021-07-30 LAB — PHOSPHORUS: Phosphorus: 3.3 mg/dL (ref 2.5–4.6)

## 2021-07-30 LAB — HEMOGLOBIN AND HEMATOCRIT, BLOOD
HCT: 27.9 % — ABNORMAL LOW (ref 39.0–52.0)
Hemoglobin: 8.8 g/dL — ABNORMAL LOW (ref 13.0–17.0)

## 2021-07-30 MED ORDER — SODIUM CHLORIDE 0.9% IV SOLUTION: Freq: Once | INTRAVENOUS | Status: AC

## 2021-07-30 NOTE — Progress Notes (Signed)
PROGRESS NOTE    [name]  LGX:[id number] DOB: [birth year] DOA: 07/27/2021 PCP: Seward Carol, MD   Brief Narrative: 88 year old with past medical history significant for recurrent diverticular bleed, type 2 diabetes, paroxysmal A-fib, not on anticoagulation due to GI bleed, on aspirin, history of gastric AVM, hypertension, hyperlipidemia who presents complaining of recurrent lower GI bleed, hematochezia that is started 07/27/2021.  Admitted for lower GI bleed requiring blood transfusion.  He received 1 unit of packed red blood cell 6/13 and another unit 6/14   Assessment & Plan:   Principal Problem:   GI bleeding Active Problems:   Diabetes mellitus type II, non insulin dependent (HCC)   Seizures (HCC)   HLD (hyperlipidemia)   Benign essential HTN   PAF (paroxysmal atrial fibrillation) (HCC)   Chronic combined CHF with recovered EF   Hemiparesis affecting left side as late effect of cerebrovascular accident (CVA) ([place])  1-Hematochezia, melena presumed recurrent diverticular bleed: He has history of gastric AVM and history of diverticular bleed Transfuse for hemoglobin less than 8. He received 1 unit of packed red blood cells 6/13 and another unit 6/14 Globin post transfusion at 8 Continue to monitor hemoglobin tonight and tomorrow morning GI following.  Patient desires supportive management due to his very advanced age, and has decide not to proceed with further procedure.   2-Anemia: Acute on chronic blood loss anemia In the setting of GI bleed.  He has received 2 units of packed red blood cells  3-Chronic Combine CHF with recovered EF;  Euvolemic.  Stable.   PAF; Stable.  Hold aspirin.   HTN:  Continue with Coreg.   HLD; Continue with Zocor.  GERD: Continue with Depakote  Pre Diabetes; A1c;6.3 Hemiparesis affecting left side medical history of CVA Precaution       Estimated body mass index is 21.52 kg/m as calculated from the following:   Height as of  this encounter: '5\' 10"'$  (1.778 m).   Weight as of this encounter: 68 kg.   DVT prophylaxis: SCD Code Status: Full code Family Communication: Care discussed with patient.  Disposition Plan:  Status is: Inpatient Remains inpatient appropriate because: GI bleed.     Consultants:  GI  Procedures:  None Antimicrobials:    Subjective: He report one BM black , no bright blood in the stool/   Objective: Vitals:   07/30/21 0414 07/30/21 0506 07/30/21 0556 07/30/21 1450  BP: 114/63 124/75 128/75 (!) 111/57  Pulse: 74 72 71 84  Resp: '16 16 16 16  '$ Temp: 97.8 F (36.6 C) 98.4 F (36.9 C) 97.8 F (36.6 C) 97.9 F (36.6 C)  TempSrc: Oral Oral Oral Oral  SpO2: 99% 98% 97% 99%  Weight:      Height:        Intake/Output Summary (Last 24 hours) at 07/30/2021 1522 Last data filed at 07/30/2021 0902 Gross per 24 hour  Intake 403.5 ml  Output 400 ml  Net 3.5 ml   Filed Weights   07/27/21 2149  Weight: 68 kg    Examination:  General exam: Appears calm and comfortable  Respiratory system: Clear to auscultation. Respiratory effort normal. Cardiovascular system: S1 & S2 heard, RRR. No JVD, murmurs, rubs, gallops or clicks. No pedal edema. Gastrointestinal system: Abdomen is nondistended, soft and nontender. No organomegaly or masses felt. Normal bowel sounds heard. Central nervous system: Alert and oriented.  Extremities: Symmetric 5 x 5 power.   Data Reviewed: I have personally reviewed following labs and imaging studies  CBC: Recent Labs  Lab 07/27/21 2217 07/28/21 0725 07/29/21 0911 07/29/21 1850 07/30/21 0234 07/30/21 1011 07/30/21 1425  WBC 10.3  --   --   --  11.3* 9.1  --   NEUTROABS 6.9  --   --   --   --  5.4  --   HGB 11.3*   < > 9.1* 8.1* 7.6* 8.4* 8.8*  HCT 36.8*   < > 29.4* 26.1* 23.9* 26.4* 27.9*  MCV 95.1  --   --   --  94.8 88.9  --   PLT 362  --   --   --  228 225  --    < > = values in this interval not displayed.   Basic Metabolic  Panel: Recent Labs  Lab 07/27/21 2217 07/30/21 0234  NA 139 137  K 4.5 3.8  CL 106 107  CO2 28 27  GLUCOSE 117* 108*  BUN 29* 22  CREATININE 0.67 0.72  CALCIUM 8.6* 7.6*  MG  --  2.0  PHOS  --  3.3   GFR: Estimated Creatinine Clearance: 62.6 mL/min (by C-G formula based on SCr of 0.72 mg/dL). Liver Function Tests: Recent Labs  Lab 07/27/21 2217 07/30/21 0234  AST 15 13*  ALT 13 13  ALKPHOS 47 33*  BILITOT 0.3 0.5  PROT 6.0* 4.5*  ALBUMIN 3.3* 2.6*   No results for input(s): "LIPASE", "AMYLASE" in the last 168 hours. No results for input(s): "AMMONIA" in the last 168 hours. Coagulation Profile: No results for input(s): "INR", "PROTIME" in the last 168 hours. Cardiac Enzymes: No results for input(s): "CKTOTAL", "CKMB", "CKMBINDEX", "TROPONINI" in the last 168 hours. BNP (last 3 results) No results for input(s): "PROBNP" in the last 8760 hours. HbA1C: No results for input(s): "HGBA1C" in the last 72 hours. CBG: No results for input(s): "GLUCAP" in the last 168 hours. Lipid Profile: No results for input(s): "CHOL", "HDL", "LDLCALC", "TRIG", "CHOLHDL", "LDLDIRECT" in the last 72 hours. Thyroid Function Tests: No results for input(s): "TSH", "T4TOTAL", "FREET4", "T3FREE", "THYROIDAB" in the last 72 hours. Anemia Panel: No results for input(s): "VITAMINB12", "FOLATE", "FERRITIN", "TIBC", "IRON", "RETICCTPCT" in the last 72 hours. Sepsis Labs: No results for input(s): "PROCALCITON", "LATICACIDVEN" in the last 168 hours.  No results found for this or any previous visit (from the past 240 hour(s)).       Radiology Studies: No results found.      Scheduled Meds:  carvedilol  3.125 mg Oral BID WC   divalproex  500 mg Oral QHS   melatonin  10 mg Oral QHS   pantoprazole  40 mg Oral Q breakfast   rOPINIRole  4 mg Oral QHS   simvastatin  40 mg Oral QHS   Continuous Infusions:   LOS: 2 days    Time spent: 35 minutes.     Elmarie Shiley, MD [facility]   If 7PM-7AM, please contact night-coverage [email or web address]  07/30/2021, 3:22 PM

## 2021-07-30 NOTE — TOC Initial Note (Addendum)
Transition of Care [facility]) - Initial/Assessment Note    Patient Details  Name: [name] MRN: [id number] Date of Birth: 11/09/1931  [facility] CM/SW Contact:    Vassie Moselle, LCSW Phone Number: 07/30/2021, 2:20 PM  Clinical Narrative:                 Met with pt and spouse who was also in the room. Pt/spouse confirmed plan for pt to return to [place] at discharge. Pt was recommended for HHPT/OT which, pt states he is already receiving through [employer]. Pt reports having no DME needs.  CSW left a voicemail with Clinical biochemist at [employer] to confirm pt's [facility] services. Pt will need resumption of care orders for [facility] at discharge.   1445: CSW received return call from [facility] with [facility] who shared that this patient has a caregiver and has had [place] services in the past through [facility]. She shared that this will be the agency to provide further [facility] services for this pt. CSW called [employer] [phone]) and confirmed that they are currently providing HHPT for this pt and will need a referral to begin [place] as well. CSW faxed referral for HHPT/OT to [phone]).  CSW will continue to follow for discharge needs.   Expected Discharge Plan: [place] Barriers to Discharge: No Barriers Identified   Patient Goals and CMS Choice Patient states their goals for this hospitalization and ongoing recovery are:: Return home   Choice offered to / list presented to : Patient  Expected Discharge Plan and Services Expected Discharge Plan: [place] In-house Referral: Clinical Social Work Discharge Planning Services: CM Consult Post Acute Care Choice: [place] arrangements for the past 2 months: [place]                 DME Arranged: N/A DME Agency: NA       HH Arranged: PT, OT [place] Agency: Other - See comment ([place])        Prior Living Arrangements/Services Living  arrangements for the past 2 months: [place] Lives with:: Spouse Patient language and need for interpreter reviewed:: Yes Do you feel safe going back to the place where you live?: Yes      Need for Family Participation in Patient Care: Yes (Comment) Care giver support system in place?: Yes (comment) Current home services: DME Criminal Activity/Legal Involvement Pertinent to Current Situation/Hospitalization: No - Comment as needed  Activities of Daily Living Home Assistive Devices/Equipment: Environmental consultant (specify type) ADL Screening (condition at time of admission) Patient's cognitive ability adequate to safely complete daily activities?: Yes Is the patient deaf or have difficulty hearing?: Yes Does the patient have difficulty seeing, even when wearing glasses/contacts?: No Does the patient have difficulty concentrating, remembering, or making decisions?: No Patient able to express need for assistance with ADLs?: Yes Does the patient have difficulty dressing or bathing?: No Independently performs ADLs?: No Communication: Independent Dressing (OT): Independent Grooming: Independent Feeding: Independent Bathing: Needs assistance Is this a change from baseline?: Pre-admission baseline Toileting: Needs assistance Is this a change from baseline?: Pre-admission baseline In/Out Bed: Needs assistance Is this a change from baseline?: Pre-admission baseline Walks in Home: Needs assistance Is this a change from baseline?: Pre-admission baseline Does the patient have difficulty walking or climbing stairs?: Yes Weakness of Legs: Left Weakness of Arms/Hands: Left  Permission Sought/Granted Permission sought to share information with : Facility  Contact Representative, Family Supports Permission granted to share information with : Yes, Verbal Permission Granted  Share Information with NAME: Ved Martos     Permission granted to share info w Relationship: Spouse  Permission  granted to share info w Contact Information: [phone]  Emotional Assessment Appearance:: Appears stated age Attitude/Demeanor/Rapport: Engaged, Gracious Affect (typically observed): Accepting, Pleasant Orientation: : Oriented to Self, Oriented to Place, Oriented to  Time, Oriented to Situation Alcohol / Substance Use: Not Applicable Psych Involvement: No (comment)  Admission diagnosis:  GI bleeding [K92.2] Rectal bleeding [K62.5] Patient Active Problem List   Diagnosis Date Noted   Hemiparesis affecting left side as late effect of cerebrovascular accident (CVA) ([place]) 07/28/2021   GI bleeding 06/23/2021   Aphasia 03/10/2021   Expressive aphasia 03/08/2021   T12 compression fracture, initial encounter ([place]) 03/08/2021   Gastric AVM    Dysphagia    Protein-calorie malnutrition, severe ([place]) 04/01/2019   Diverticulosis large intestine w/o perforation or abscess w/bleeding    GERD (gastroesophageal reflux disease) 04/21/2018   Diverticulosis    Acute on chronic blood loss anemia 12/24/2017   Chronic combined CHF with recovered EF 12/23/2017   BPPV (benign paroxysmal positional vertigo) 10/25/2016   TIA (transient ischemic attack) 07/22/2016   Benign essential HTN    PAF (paroxysmal atrial fibrillation) (HCC)    Stroke-like symptom 02/22/2016   History of lower GI bleeding Dec 2017 02/01/2016   Chronic anticoagulation 2/2 history of CVA 01/31/2016   History of CVA (cerebrovascular accident) 01/31/2016   Sinusitis, chronic 12/12/2015   Cough variant asthma 10/31/2015   Insomnia 07/25/2015   HLD (hyperlipidemia) 02/03/2014   Ulnar neuropathy at elbow of right upper extremity 02/03/2014   Cervical radiculopathy 02/03/2014   Seizures ([place])    Diabetes mellitus type II, non insulin dependent ([place]) 12/22/2013   Recurrent ventral hernia 12/15/2011   History of esophageal strciture    Diverticulosis of colon with hemorrhage 06/24/2007   PCP:  Seward Carol, MD Pharmacy:   [facility], [place] - [address] [place] [address] Phone: [phone] Fax: [phone]     Social Determinants of Health (SDOH) Interventions    Readmission Risk Interventions     No data to display

## 2021-07-30 NOTE — Evaluation (Signed)
Physical Therapy Evaluation Patient Details Name: [name] MRN: [id number] DOB: [birth year] Today's Date: 07/30/2021  History of Present Illness  Pt is 88 yo male admitted 07/27/21 for lower GI bleed requiring blood transfusion. PMH includes type 2 diabetes, history of paroxysmal atrial fibrillation not on anticoagulation due to GI bleeding, history of gastric AVMs, hypertension, hyperlipidemia, and Hemiparesis affecting left side as late effect of cerebrovascular accident  Clinical Impression  Pt admitted with above diagnosis.  Pt currently with functional limitations due to the deficits listed below (see PT Problem List). Pt will benefit from skilled PT to increase their independence and safety with mobility to allow discharge to the venue listed below.  Pt very pleasant and eager to mobilize.  Pt typically uses motorized scooter at baseline however reports he does do some short distance ambulation for rollator at times.  Pt reports he lives with his wife in [place] and has had periods of PT as needed in the past.  Pt reports being a little weaker and not quite at baseline in regards to mobility today however very eager for d/c home.        Recommendations for follow up therapy are one component of a multi-disciplinary discharge planning process, led by the attending physician.  Recommendations may be updated based on patient status, additional functional criteria and insurance authorization.  Follow Up Recommendations Home health PT    Assistance Recommended at Discharge PRN  Patient can return home with the following  A little help with walking and/or transfers    Equipment Recommendations None recommended by PT  Recommendations for Other Services       Functional Status Assessment Patient has had a recent decline in their functional status and demonstrates the ability to make significant improvements in function in a reasonable and predictable amount of time.     Precautions /  Restrictions Precautions Precautions: Fall Required Braces or Orthoses: Other Brace Other Brace: typically wears Left AFO and knee brace to limit hyperextension Restrictions Weight Bearing Restrictions: No      Mobility  Bed Mobility               General bed mobility comments: pt in recliner    Transfers Overall transfer level: Needs assistance Equipment used: Rolling walker (2 wheels) Transfers: Sit to/from Stand Sit to Stand: Min assist           General transfer comment: pt utilized armrests of recliner to self assist, light assist to rise    Ambulation/Gait Ambulation/Gait assistance: Min assist, Min guard Gait Distance (Feet): 15 Feet Assistive device: Rolling walker (2 wheels) Gait Pattern/deviations: Step-to pattern       General Gait Details: pt reports he does not have AFO or knee brace in hospital so limited distance, increased left hip flexion for foot clearance, slow pace, occasional assist for weakness  Stairs            Wheelchair Mobility    Modified Rankin (Stroke Patients Only)       Balance Overall balance assessment: History of Falls, Needs assistance         Standing balance support: Bilateral upper extremity supported, During functional activity, Reliant on assistive device for balance Standing balance-Leahy Scale: Poor                               Pertinent Vitals/Pain Pain Assessment Pain Assessment: No/denies pain    Home Living Family/patient expects to  be discharged to:: Other (Comment) (ILF) Living Arrangements: Spouse/significant other Available Help at Discharge: Family Type of Home: [place]: Shower seat;Grab bars - tub/shower;Grab bars - toilet;Rollator (4 wheels);Wheelchair - power;Other (comment);Hand held shower head;Adaptive equipment Additional Comments: Per OT note from 5/9, pt has home aid 5hrs/day 5 days/week.    Prior Function Prior  Level of Function : History of Falls (last six months)             Mobility Comments: h/o falls. uses power scooter to get to bathroom, transfers with rollator, pt able to ambulate short distance at times; reports in/out of PT at facility ADLs Comments: Aide assists with LB ADLs     Hand Dominance   Dominant Hand: Right    Extremity/Trunk Assessment   Upper Extremity Assessment Upper Extremity Assessment: Generalized weakness    Lower Extremity Assessment Lower Extremity Assessment: Generalized weakness;LLE deficits/detail LLE Deficits / Details: left hemiparesis from hx CVA, wears AFO for ankle and knee brace for hyperextension    Cervical / Trunk Assessment Cervical / Trunk Assessment: Kyphotic (rounded shoulders, forward head)  Communication   Communication: No difficulties  Cognition Arousal/Alertness: Awake/alert Behavior During Therapy: WFL for tasks assessed/performed Overall Cognitive Status: Within Functional Limits for tasks assessed                                          General Comments      Exercises     Assessment/Plan    PT Assessment Patient needs continued PT services  PT Problem List Decreased strength;Decreased activity tolerance;Decreased balance;Decreased mobility;Decreased coordination       PT Treatment Interventions DME instruction;Gait training;Therapeutic exercise;Functional mobility training;Therapeutic activities;Patient/family education;Balance training;Neuromuscular re-education    PT Goals (Current goals can be found in the Care Plan section)  Acute Rehab PT Goals PT Goal Formulation: With patient Time For Goal Achievement: 08/13/21 Potential to Achieve Goals: Good    Frequency Min 3X/week     Co-evaluation               AM-PAC PT "6 Clicks" Mobility  Outcome Measure Help needed turning from your back to your side while in a flat bed without using bedrails?: A Little Help needed moving from lying  on your back to sitting on the side of a flat bed without using bedrails?: A Little Help needed moving to and from a bed to a chair (including a wheelchair)?: A Little Help needed standing up from a chair using your arms (e.g., wheelchair or bedside chair)?: A Little Help needed to walk in hospital room?: A Lot Help needed climbing 3-5 steps with a railing? : Total 6 Click Score: 15    End of Session Equipment Utilized During Treatment: Gait belt Activity Tolerance: Patient tolerated treatment well Patient left: in chair;with call bell/phone within reach;with chair alarm set   PT Visit Diagnosis: Other abnormalities of gait and mobility (R26.89)    Time: 1212-1226 PT Time Calculation (min) (ACUTE ONLY): 14 min   Charges:   PT Evaluation $PT Eval Low Complexity: 1 Low        Kati PT, DPT Acute Rehabilitation Services Pager: [phone] Office: [phone]   Myrtis Hopping [place] 07/30/2021, 1:25 PM

## 2021-07-30 NOTE — Evaluation (Signed)
Occupational Therapy Evaluation Patient Details Name: [name] MRN: [id number] DOB: September 23, 1931 Today's Date: 07/30/2021   History of Present Illness [name] is 88 yo male who presented to ER with onset of bloody stools. PMH includes type 2 diabetes, history of paroxysmal atrial fibrillation not on anticoagulation due to GI bleeding, history of gastric AVMs, hypertension, hyperlipidemia.   Clinical Impression   [name] admitted with the above diagnoses and presents with below problem list. [name] will benefit from continued acute OT to address the below listed deficits and maximize independence with basic ADLs prior to d/c back to [facility] where he lives with his wife. PTA, [name] was completing stand pivot transfers to access motorized scooter in lieu of walking. Per last admission note, [name] has [place] aide 5 hours/day 5 days/week. [name] in good spirits, feeling fatigued at start of session. [name] currently needs up to mod A to take pivotal steps to access BSC/recliner, up to max A for LB ADLs. [name] up in recliner at end of session, inquiring about his lab results.        Recommendations for follow up therapy are one component of a multi-disciplinary discharge planning process, led by the attending physician.  Recommendations may be updated based on patient status, additional functional criteria and insurance authorization.   Follow Up Recommendations  Home health OT    Assistance Recommended at Discharge Frequent or constant Supervision/Assistance  Patient can return home with the following A little help with walking and/or transfers;A lot of help with bathing/dressing/bathroom;Assistance with cooking/housework;Assist for transportation    Functional Status Assessment  Patient has had a recent decline in their functional status and demonstrates the ability to make significant improvements in function in a reasonable and predictable amount of time.  Equipment Recommendations  None recommended by OT    Recommendations for  Other Services [name] consult     Precautions / Restrictions Precautions Precautions: Fall;Other (comment) (h/o falls) Restrictions Weight Bearing Restrictions: No      Mobility Bed Mobility Overal bed mobility: Needs Assistance Bed Mobility: Supine to Sit     Supine to sit: Min assist, HOB elevated     General bed mobility comments: assist to pivot hips to full EOB position. extra time and effort, relied on rail and momentum to powerup trunk and advance BLE across bed    Transfers Overall transfer level: Needs assistance Equipment used: Rolling walker (2 wheels) Transfers: Sit to/from Stand, Bed to chair/wheelchair/BSC Sit to Stand: Mod assist, From elevated surface Stand pivot transfers: Mod assist, From elevated surface         General transfer comment: EOB>recliner going towards [name]'s right side. EOB elevated a bit. Assist to stabilize rw and steady during powerup, min-mod A to steady during SPT, knees noted to buckle. min A to control descent into chair      Balance Overall balance assessment: History of Falls, Needs assistance Sitting-balance support: Bilateral upper extremity supported, Feet supported, Single extremity supported Sitting balance-Leahy Scale: Fair     Standing balance support: Bilateral upper extremity supported, During functional activity, Reliant on assistive device for balance Standing balance-Leahy Scale: Poor Standing balance comment: rw and up to min A in static standing                           ADL either performed or assessed with clinical judgement   ADL Overall ADL's : Needs assistance/impaired Eating/Feeding: Set up;Sitting   Grooming: Set up;Sitting   Upper Body  Bathing: Minimal assistance;Sitting   Lower Body Bathing: Moderate assistance;Maximal assistance;Sit to/from stand   Upper Body Dressing : Set up;Minimal assistance;Sitting   Lower Body Dressing: Moderate assistance;Maximal assistance;Sit to/from stand    Toilet Transfer: Moderate assistance;Stand-pivot;Rolling walker (2 wheels);BSC/3in1   Toileting- Clothing Manipulation and Hygiene: Moderate assistance;Maximal assistance;Sit to/from stand         General ADL Comments: supported sitting for seated UB ADLs. knees buckle in standing. pivotal steps only and up to mod A for transfers, max A with addition of LB ADL task managment     Vision Baseline Vision/History: 1 Wears glasses       Perception     Praxis      Pertinent Vitals/Pain Pain Assessment Pain Assessment: Faces Faces Pain Scale: Hurts a little bit Pain Location: generalized Pain Descriptors / Indicators: Aching Pain Intervention(s): Monitored during session     Hand Dominance Right   Extremity/Trunk Assessment Upper Extremity Assessment Upper Extremity Assessment: Generalized weakness   Lower Extremity Assessment Lower Extremity Assessment: Defer to [name] evaluation (knees buckling during stand-pivot transfers)   Cervical / Trunk Assessment Cervical / Trunk Assessment: Kyphotic (forward/round neck)   Communication Communication Communication: No difficulties   Cognition Arousal/Alertness: Awake/alert Behavior During Therapy: WFL for tasks assessed/performed Overall Cognitive Status: Within Functional Limits for tasks assessed                                       General Comments       Exercises     Shoulder Instructions      Home Living Family/patient expects to be discharged to:: Other (Comment) (ILF) Living Arrangements: Spouse/significant other                               Additional Comments: Per OT note from 5/9, [name] has home aid 5hrs/day 5 days/week.      Prior Functioning/Environment Prior Level of Function : History of Falls (last six months)             Mobility Comments: h/o falls. uses power scooter to get to bathroom, transfers with rw. sounds like he only takes pivotal steps. ADLs Comments: Aide  assists with LB ADLs        OT Problem List: Decreased strength;Decreased activity tolerance;Impaired balance (sitting and/or standing);Decreased knowledge of use of DME or AE;Decreased knowledge of precautions;Pain      OT Treatment/Interventions: Self-care/ADL training;Therapeutic exercise;DME and/or AE instruction;Energy conservation;Therapeutic activities;Patient/family education;Balance training    OT Goals(Current goals can be found in the care plan section) Acute Rehab OT Goals Patient Stated Goal: eager to return home OT Goal Formulation: With patient Time For Goal Achievement: 08/13/21 Potential to Achieve Goals: Good ADL Goals [name] Will Perform Upper Body Dressing: with set-up;sitting [name] Will Perform Lower Body Dressing: with min guard assist;with adaptive equipment;sit to/from stand;sitting/lateral leans [name] Will Transfer to Toilet: with min guard assist;stand pivot transfer [name] Will Perform Toileting - Clothing Manipulation and hygiene: with min guard assist;sitting/lateral leans;with min assist;sit to/from stand Additional ADL Goal #1: [name] will complete bed mobility at min guard level to prepare for EOB/OOB ADLs.  OT Frequency: Min 2X/week    Co-evaluation              AM-PAC OT "6 Clicks" Daily Activity     Outcome Measure Help from another person eating meals?: None Help  from another person taking care of personal grooming?: A Little Help from another person toileting, which includes using toliet, bedpan, or urinal?: A Lot Help from another person bathing (including washing, rinsing, drying)?: A Lot Help from another person to put on and taking off regular upper body clothing?: A Little Help from another person to put on and taking off regular lower body clothing?: A Lot 6 Click Score: 16   End of Session Equipment Utilized During Treatment: Rolling walker (2 wheels) Nurse Communication: Mobility status  Activity Tolerance: Patient limited by fatigue Patient  left: in chair;with call bell/phone within reach;with chair alarm set  OT Visit Diagnosis: Unsteadiness on feet (R26.81);Muscle weakness (generalized) (M62.81);Pain;History of falling (Z91.81);Repeated falls (R29.6)                Time: 7322-0254 OT Time Calculation (min): 12 min Charges:  OT General Charges $OT Visit: 1 Visit OT Evaluation $OT Eval Moderate Complexity: [place], OT Acute Rehabilitation Services Office: [phone]   Hortencia Pilar 07/30/2021, 10:53 AM

## 2021-07-30 NOTE — Progress Notes (Signed)
Patient ID: [name], male   DOB: [birth year], 88 y.o.   MRN: [id number]    Progress Note   Subjective  Day # 3 CC; recurrent GI bleeding  Hemoglobin 10.5 on admit> 7.9 yesterday a.m. transfused x1->9.1> down to 7.6 early a.m. today received 1 unit-hemoglobin 8.4  1 black stool this morning no obvious blood. Patient says he feels better today was able to sleep last night, he did not have any bowel movements through the night He is anxious to be discharged from the hospital today, hoping to avoid another night in the hospital   Objective   Vital signs in last 24 hours: Temp:  [97.7 F (36.5 C)-98.4 F (36.9 C)] 97.8 F (36.6 C) (06/14 0556) Pulse Rate:  [71-81] 71 (06/14 0556) Resp:  [16-20] 16 (06/14 0556) BP: (97-130)/(62-92) 128/75 (06/14 0556) SpO2:  [97 %-100 %] 97 % (06/14 0556) Last BM Date : 07/29/21 General:    Very elderly white male n NAD-sitting up in chair Heart:  Regular rate and rhythm; no murmurs Lungs: Respirations even and unlabored, lungs CTA bilaterally Abdomen:  Soft, nontender and nondistended. Normal bowel sounds. Extremities:  Without edema. Neurologic:  Alert and oriented,  grossly normal neurologically. Psych:  Cooperative. Normal mood and affect.  Intake/Output from previous day: 06/13 0701 - 06/14 0700 In: 403.5 [P.O.:120; Blood:283.5] Out: -  Intake/Output this shift: Total I/O In: 120 [P.O.:120] Out: 400 [Urine:400]  Lab Results: Recent Labs    07/27/21 2217 07/28/21 0725 07/29/21 0911 07/29/21 1850 07/30/21 0234  WBC 10.3  --   --   --  11.3*  HGB 11.3*   < > 9.1* 8.1* 7.6*  HCT 36.8*   < > 29.4* 26.1* 23.9*  PLT 362  --   --   --  228   < > = values in this interval not displayed.   BMET Recent Labs    07/27/21 2217 07/30/21 0234  NA 139 137  K 4.5 3.8  CL 106 107  CO2 28 27  GLUCOSE 117* 108*  BUN 29* 22  CREATININE 0.67 0.72  CALCIUM 8.6* 7.6*   LFT Recent Labs    07/30/21 0234  PROT 4.5*  ALBUMIN 2.6*   AST 13*  ALT 13  ALKPHOS 33*  BILITOT 0.5   PT/INR No results for input(s): "LABPROT", "INR" in the last 72 hours.       Assessment / Plan:    #23 88 year old white male with history of recurrent GI bleeding, very recent admission with presumed diverticular bleed May 2023-managed with supportive care and did not require transfusion Readmitted with melenic appearing stool then grossly bloody stool  He also has previously documented AVMs duodenal and jejunal as well as 1 small gastric AVM treated with APC in the past  He has required 2 units of packed RBCs this admission, just had another unit this morning 1 black bowel movement this a.m.- Bleeding has been slow to resolve this admission and I am not convinced he has completely stopped Patient desires supportive management and due to his very advanced age and repeated episodes of bleeding he has decided not to pursue any further endoscopic evaluation these episodes.  #2 anemia acute on chronic secondary to recurrent bleeding #3 congestive heart failure #4 history of CVA left hemiparesis #5 hypertension # 6 status post remote Nissen fundoplication with component of chronic dysphagia secondary to distal esophageal ring, dysmotility and also has a moderate paraesophageal hernia  Plan; advance back to regular diet  today Can check hemoglobin this afternoon and if completely stable and no further stools could consider discharge home later this afternoon versus a.m.  We can arrange for outpatient follow-up hemoglobins.  Principal Problem:   GI bleeding Active Problems:   Diabetes mellitus type II, non insulin dependent (HCC)   Seizures (HCC)   HLD (hyperlipidemia)   Benign essential HTN   PAF (paroxysmal atrial fibrillation) (HCC)   Chronic combined CHF with recovered EF   Hemiparesis affecting left side as late effect of cerebrovascular accident (CVA) ([place])     LOS: 2 days   Abegail Kloeppel PA-C 07/30/2021, 9:36 AM

## 2021-07-31 DIAGNOSIS — K625 Hemorrhage of anus and rectum: Secondary | ICD-10-CM

## 2021-07-31 LAB — BPAM RBC
Blood Product Expiration Date: 202307062359
Blood Product Expiration Date: 202307062359
ISSUE DATE / TIME: 202306130339
ISSUE DATE / TIME: 202306140346
Unit Type and Rh: 5100
Unit Type and Rh: 5100

## 2021-07-31 LAB — TYPE AND SCREEN
ABO/RH(D): O POS
Antibody Screen: NEGATIVE
Unit division: 0
Unit division: 0

## 2021-07-31 LAB — CBC
HCT: 26.1 % — ABNORMAL LOW (ref 39.0–52.0)
Hemoglobin: 8.3 g/dL — ABNORMAL LOW (ref 13.0–17.0)
MCH: 28.5 pg (ref 26.0–34.0)
MCHC: 31.8 g/dL (ref 30.0–36.0)
MCV: 89.7 fL (ref 80.0–100.0)
Platelets: 244 10*3/uL (ref 150–400)
RBC: 2.91 MIL/uL — ABNORMAL LOW (ref 4.22–5.81)
RDW: 25.2 % — ABNORMAL HIGH (ref 11.5–15.5)
WBC: 10.5 10*3/uL (ref 4.0–10.5)
nRBC: 0 % (ref 0.0–0.2)

## 2021-07-31 MED ORDER — FERROUS SULFATE 325 (65 FE) MG PO TABS: 325.0000 mg | ORAL_TABLET | Freq: Two times a day (BID) | ORAL | 1 refills | Status: AC

## 2021-07-31 MED ORDER — ORAL CARE MOUTH RINSE: 15.0000 mL | OROMUCOSAL | Status: DC | PRN

## 2021-07-31 NOTE — Discharge Summary (Signed)
Physician Discharge Summary   Patient: [name] MRN: [id number] DOB: Dec 23, 1931  Admit date:     07/27/2021  Discharge date: 07/31/21  Discharge Physician: Elmarie Shiley   PCP: Seward Carol, MD   Recommendations at discharge:   Patient to discussed with PCP early next week in regards risk benefit of resumption of aspirin. Could consider palliative care consultation.   Discharge Diagnoses: Principal Problem:   GI bleeding Active Problems:   Diabetes mellitus type II, non insulin dependent (HCC)   Seizures (HCC)   HLD (hyperlipidemia)   Benign essential HTN   PAF (paroxysmal atrial fibrillation) (HCC)   Chronic combined CHF with recovered EF   Hemiparesis affecting left side as late effect of cerebrovascular accident (CVA) ([place])  Resolved Problems:   * No resolved hospital problems. [facility] Course: 88 year old with past medical history significant for recurrent diverticular bleed, type 2 diabetes, paroxysmal A-fib, not on anticoagulation due to GI bleed, on aspirin, history of gastric AVM, hypertension, hyperlipidemia who presents complaining of recurrent lower GI bleed, hematochezia that is started 07/27/2021.  Admitted for lower GI bleed requiring blood transfusion.  He received 1 unit of packed red blood cell 6/13 and another unit 6/14  He was treated with support care (blood transfusion)  for presume diverticular bleed. Aspirin has been on hold. He will discussed with PCP risk benefit of resumption aspirin. He has had two admission in the last month for GI bleed. Patients agrees to hold aspirin at this time.    Assessment and Plan: 1-Hematochezia, melena presumed recurrent diverticular bleed: He has history of gastric AVM and history of diverticular bleed Transfuse for hemoglobin less than 8. He received 1 unit of packed red blood cells 6/13 and another unit 6/14 Hb  post transfusion remain stable at 8.  GI was consulted and assisted in care.  Patient desires  supportive management due to his very advanced age, and has decide not to proceed with further procedure.  His Hb has remain stable. He is stable for discharge. He will resume iron supplement in few days, when black stool has clear.  Per GI, recommend risk benefit discussion with patient in regards resumption of aspirin. I discussed with patient and his wife, he agrees to hold aspirin for now to give time he recovers from this bleed. He will follow up with PCP next week to further discussed.     2-Anemia: Acute on chronic blood loss anemia In the setting of GI bleed.  He has received 2 units of packed red blood cells   3-Chronic Combine CHF with recovered EF;  Euvolemic.  Stable.    PAF; Stable.  Hold aspirin.    HTN:  Continue with Coreg.    HLD; Continue with Zocor.  GERD: Continue with Depakote   Pre Diabetes; A1c;6.3 Hemiparesis affecting left side medical history of CVA          Consultants: GI  Procedures performed: None Disposition: Home Diet recommendation:  Discharge Diet Orders (From admission, onward)     Start     Ordered   07/31/21 0000  Diet - low sodium heart healthy        07/31/21 1144           Cardiac diet DISCHARGE MEDICATION: Allergies as of 07/31/2021       Reactions   Tape Other (See Comments)   Pt has sensitive skin, skin tears and bruises easily - please use paper tape.  Medication List     STOP taking these medications    aspirin EC 81 MG tablet   gabapentin 300 MG capsule Commonly known as: NEURONTIN   Tafluprost (PF) 0.0015 % Soln   timolol 0.25 % ophthalmic solution Commonly known as: TIMOPTIC       TAKE these medications    acetaminophen 325 MG tablet Commonly known as: Tylenol Take 2 tablets (650 mg total) by mouth every 6 (six) hours as needed for mild pain, fever or headache. What changed: when to take this   albuterol 108 (90 Base) MCG/ACT inhaler Commonly known as: VENTOLIN HFA Inhale 2 puffs  into the lungs every 6 (six) hours as needed for wheezing or shortness of breath.   budesonide 0.5 MG/2ML nebulizer solution Commonly known as: PULMICORT Take 2 mLs by nebulization See admin instructions. Inhale 2 ml via nebulizer daily. May repeat once if needed.   carvedilol 3.125 MG tablet Commonly known as: COREG Take 1 tablet (3.125 mg total) by mouth 2 (two) times daily with a meal.   divalproex 125 MG capsule Commonly known as: DEPAKOTE SPRINKLE Take 500 mg by mouth at bedtime.   ferrous sulfate 325 (65 FE) MG tablet Take 1 tablet (325 mg total) by mouth 2 (two) times daily. Start taking on: August 04, 2021 What changed: These instructions start on August 04, 2021. If you are unsure what to do until then, ask your doctor or other care provider.   MELATONIN PO Take 1 tablet by mouth daily.   metFORMIN 500 MG tablet Commonly known as: GLUCOPHAGE Take 500 mg by mouth daily.   pantoprazole 40 MG tablet Commonly known as: PROTONIX Take 1 tablet (40 mg total) by mouth daily with breakfast.   polyethylene glycol 17 g packet Commonly known as: MIRALAX / GLYCOLAX Take 17 g by mouth daily.   predniSONE 10 MG tablet Commonly known as: DELTASONE Take 5 mg by mouth daily.   psyllium 58.6 % packet Commonly known as: METAMUCIL Take 1 packet by mouth daily.   rOPINIRole 3 MG tablet Commonly known as: REQUIP Take 3 mg by mouth at bedtime. What changed: Another medication with the same name was removed. Continue taking this medication, and follow the directions you see here.   simvastatin 40 MG tablet Commonly known as: ZOCOR Take 1 tablet (40 mg total) by mouth daily. What changed: when to take this   sodium chloride 0.65 % Soln nasal spray Commonly known as: OCEAN Place 1 spray into both nostrils 2 (two) times daily as needed (dryness).   traMADol 50 MG tablet Commonly known as: ULTRAM Take 1 tablet (50 mg total) by mouth daily as needed for moderate pain. What changed:  when to take this   VITAMIN D-3 PO Take 1 tablet by mouth in the morning.        Follow-up Information     Seward Carol, MD Follow up in 1 week(s).   Specialty: Internal Medicine Contact information: [address] [place] [address] [place] [address] [phone]                Discharge Exam: Danley Danker Weights   07/27/21 2149 07/31/21 0122  Weight: 68 kg 67.8 kg   General; NAD Lung; CTA  Condition at discharge: stable  The results of significant diagnostics from this hospitalization (including imaging, microbiology, ancillary and laboratory) are listed below for reference.   Imaging Studies: No results found.  Microbiology: Results for orders placed or performed during the hospital encounter of  06/22/21  MRSA Next Gen by PCR, Nasal     Status: None   Collection Time: 06/23/21  6:30 PM   Specimen: Nasal Mucosa; Nasal Swab  Result Value Ref Range Status   MRSA by PCR Next Gen NOT DETECTED NOT DETECTED Final    Comment: (NOTE) The GeneXpert MRSA Assay (FDA approved for NASAL specimens only), is one component of a comprehensive MRSA colonization surveillance program. It is not intended to diagnose MRSA infection nor to guide or monitor treatment for MRSA infections. Test performance is not FDA approved in patients less than 79 years old. Performed at [facility], [place] [address] [place],  [address]     Labs: CBC: Recent Labs  Lab 07/27/21 2217 07/28/21 0725 07/29/21 1850 07/30/21 0234 07/30/21 1011 07/30/21 1425 07/31/21 0540  WBC 10.3  --   --  11.3* 9.1  --  10.5  NEUTROABS 6.9  --   --   --  5.4  --   --   HGB 11.3*   < > 8.1* 7.6* 8.4* 8.8* 8.3*  HCT 36.8*   < > 26.1* 23.9* 26.4* 27.9* 26.1*  MCV 95.1  --   --  94.8 88.9  --  89.7  PLT 362  --   --  228 225  --  244   < > = values in this interval not displayed.   Basic Metabolic Panel: Recent Labs  Lab 07/27/21 2217 07/30/21 0234  NA 139 137  K 4.5 3.8  CL  106 107  CO2 28 27  GLUCOSE 117* 108*  BUN 29* 22  CREATININE 0.67 0.72  CALCIUM 8.6* 7.6*  MG  --  2.0  PHOS  --  3.3   Liver Function Tests: Recent Labs  Lab 07/27/21 2217 07/30/21 0234  AST 15 13*  ALT 13 13  ALKPHOS 47 33*  BILITOT 0.3 0.5  PROT 6.0* 4.5*  ALBUMIN 3.3* 2.6*   CBG: No results for input(s): "GLUCAP" in the last 168 hours.  Discharge time spent: greater than 30 minutes.  Signed: Elmarie Shiley, MD [facility] 07/31/2021

## 2021-07-31 NOTE — TOC Transition Note (Signed)
Transition of Care [facility]) - CM/SW Discharge Note   Patient Details  Name: DESTINY TRICKEY MRN: [id number] Date of Birth: 23-Feb-1931  [facility] CM/SW Contact:  Vassie Moselle, LCSW Phone Number: 07/31/2021, 12:04 PM   Clinical Narrative:    Pt is to return to [place] at discharge with HHPT/OT through [employer]. No DME needs identified. Pt to be transported home by family.   Final next level of care: Home w Home Health Services Barriers to Discharge: No Barriers Identified   Patient Goals and CMS Choice Patient states their goals for this hospitalization and ongoing recovery are:: Return home   Choice offered to / list presented to : Patient  Discharge Placement                       Discharge Plan and Services In-house Referral: Clinical Social Work Discharge Planning Services: CM Consult Post Acute Care Choice: Home Health          DME Arranged: N/A DME Agency: NA       HH Arranged: PT, OT Center Agency: Other - See comment ([place]) Date HH Agency Contacted: 07/30/21 Time HH Agency Contacted: 1430    Social Determinants of Health (SDOH) Interventions     Readmission Risk Interventions    07/31/2021   12:02 PM  Readmission Risk Prevention Plan  Transportation Screening Complete  PCP or Specialist Appt within 3-5 Days Complete  HRI or [place] Complete  Social Work Consult for [place] Planning/Counseling Complete  Palliative Care Screening Not Applicable  Medication Review [employer]) Complete

## 2021-07-31 NOTE — Care Management Important Message (Signed)
Important Message  Patient Details IM Letter given to the Patient. Name: [name] MRN: [id number] Date of Birth: 1931-12-07   Medicare Important Message Given:  Yes     Kerin Salen 07/31/2021, 11:37 AM

## 2021-07-31 NOTE — Progress Notes (Signed)
     [facility] Progress Note  CC:  recurrent GI bleeding  Subjective: He wishes to go home today. He passed a small dark stool yesterday. No BM today. No rectal bleeding or melena. He is tolerating a regular diet. No N/V or abdominal pan. No CP or SOB. He is getting ready to sit up in the chair, nurse tech at bedside to assist.   Objective:  Vital signs in last 24 hours: Temp:  [97.9 F (36.6 C)-98.2 F (36.8 C)] 98 F (36.7 C) (06/15 0607) Pulse Rate:  [72-84] 72 (06/15 0607) Resp:  [16] 16 (06/15 0607) BP: (111-115)/(57-97) 112/97 (06/15 0607) SpO2:  [99 %-100 %] 99 % (06/15 0607) Weight:  [67.8 kg] 67.8 kg (06/15 0122) Last BM Date : 07/30/21 General:  Alert 88 year old male in NAD.  Heart: RRR, no murmur.  Pulm: Breath sounds clear throughout.  Abdomen: Soft, nontender. + BS x 4 quads. Midline scar intact.  Extremities:  Without edema. Neurologic:  Alert and  oriented x 4. Grossly normal neurologically. Psych:  Alert and cooperative. Normal mood and affect.  Intake/Output from previous day: 06/14 0701 - 06/15 0700 In: 2148.2 [P.O.:600; I.V.:1548.2] Out: 1550 [Urine:1550] Intake/Output this shift: Total I/O In: 240 [P.O.:240] Out: -   Lab Results: Recent Labs    07/30/21 0234 07/30/21 1011 07/30/21 1425 07/31/21 0540  WBC 11.3* 9.1  --  10.5  HGB 7.6* 8.4* 8.8* 8.3*  HCT 23.9* 26.4* 27.9* 26.1*  PLT 228 225  --  244   BMET Recent Labs    07/30/21 0234  NA 137  K 3.8  CL 107  CO2 27  GLUCOSE 108*  BUN 22  CREATININE 0.72  CALCIUM 7.6*   LFT Recent Labs    07/30/21 0234  PROT 4.5*  ALBUMIN 2.6*  AST 13*  ALT 13  ALKPHOS 33*  BILITOT 0.5   PT/INR No results for input(s): "LABPROT", "INR" in the last 72 hours. Hepatitis Panel No results for input(s): "HEPBSAG", "HCVAB", "HEPAIGM", "HEPBIGM" in the last 72 hours.  No results found.  Assessment / Plan:  58) 88 year old male with a history of a gastric incisura and small  bowel AVMs, diverticulosis admitted to the hospital with recurrent GI bleeding and anemia. Hg 7.9 -> Transfused 2 units of PRBCs on 6/13 - 6/14. Hg 8.8 -> 8.3. Patient desires supportive management, due to his very advanced age with recurrent episodes of bleeding he has decided not to pursue any further endoscopic evaluation.  Hg 8.8 -> 8.3. No overt GI bleeding overnight or thus far today. Hemodynamically stable.  -Likely will be discharged home today -Follow up with PCP, repeat CBC in 1 week  2) Congestive heart failure  3) History of CVA left hemiparesis  4) Hypertension  5) Status post remote Nissen fundoplication with component of chronic dysphagia secondary to distal esophageal ring, dysmotility and also has a moderate paraesophageal hernia  Principal Problem:   GI bleeding Active Problems:   Diabetes mellitus type II, non insulin dependent (HCC)   Seizures (HCC)   HLD (hyperlipidemia)   Benign essential HTN   PAF (paroxysmal atrial fibrillation) (HCC)   Chronic combined CHF with recovered EF   Hemiparesis affecting left side as late effect of cerebrovascular accident (CVA) ([place])     LOS: 3 days   Noralyn Pick  07/31/2021, 10:32 AM

## 2021-07-31 NOTE — Progress Notes (Signed)
Pt discharged to independent living. PIV removed. VSS. Pt has all belongings. AVS printed and educational teaching with teach back method. Spouse present during this time. No further questions at this time.

## 2021-07-31 NOTE — Plan of Care (Signed)

## 2021-08-05 DIAGNOSIS — I639 Cerebral infarction, unspecified: Secondary | ICD-10-CM | POA: Diagnosis not present

## 2021-08-05 DIAGNOSIS — K5791 Diverticulosis of intestine, part unspecified, without perforation or abscess with bleeding: Secondary | ICD-10-CM | POA: Diagnosis not present

## 2021-08-05 DIAGNOSIS — Z7189 Other specified counseling: Secondary | ICD-10-CM | POA: Diagnosis not present

## 2021-08-05 DIAGNOSIS — D500 Iron deficiency anemia secondary to blood loss (chronic): Secondary | ICD-10-CM | POA: Diagnosis not present

## 2021-08-06 DIAGNOSIS — H0102A Squamous blepharitis right eye, upper and lower eyelids: Secondary | ICD-10-CM | POA: Diagnosis not present

## 2021-08-06 DIAGNOSIS — Z961 Presence of intraocular lens: Secondary | ICD-10-CM | POA: Diagnosis not present

## 2021-08-06 DIAGNOSIS — H35372 Puckering of macula, left eye: Secondary | ICD-10-CM | POA: Diagnosis not present

## 2021-08-06 DIAGNOSIS — H40013 Open angle with borderline findings, low risk, bilateral: Secondary | ICD-10-CM | POA: Diagnosis not present

## 2021-08-06 DIAGNOSIS — H02132 Senile ectropion of right lower eyelid: Secondary | ICD-10-CM | POA: Diagnosis not present

## 2021-08-06 DIAGNOSIS — H43813 Vitreous degeneration, bilateral: Secondary | ICD-10-CM | POA: Diagnosis not present

## 2021-08-06 DIAGNOSIS — H0102B Squamous blepharitis left eye, upper and lower eyelids: Secondary | ICD-10-CM | POA: Diagnosis not present

## 2021-08-06 DIAGNOSIS — E119 Type 2 diabetes mellitus without complications: Secondary | ICD-10-CM | POA: Diagnosis not present

## 2021-08-08 DIAGNOSIS — M1712 Unilateral primary osteoarthritis, left knee: Secondary | ICD-10-CM | POA: Diagnosis not present

## 2021-08-08 DIAGNOSIS — I482 Chronic atrial fibrillation, unspecified: Secondary | ICD-10-CM | POA: Diagnosis not present

## 2021-08-08 DIAGNOSIS — M6281 Muscle weakness (generalized): Secondary | ICD-10-CM | POA: Diagnosis not present

## 2021-08-08 DIAGNOSIS — I5042 Chronic combined systolic (congestive) and diastolic (congestive) heart failure: Secondary | ICD-10-CM | POA: Diagnosis not present

## 2021-08-08 DIAGNOSIS — R262 Difficulty in walking, not elsewhere classified: Secondary | ICD-10-CM | POA: Diagnosis not present

## 2021-08-12 DIAGNOSIS — I5042 Chronic combined systolic (congestive) and diastolic (congestive) heart failure: Secondary | ICD-10-CM | POA: Diagnosis not present

## 2021-08-12 DIAGNOSIS — I482 Chronic atrial fibrillation, unspecified: Secondary | ICD-10-CM | POA: Diagnosis not present

## 2021-08-12 DIAGNOSIS — R262 Difficulty in walking, not elsewhere classified: Secondary | ICD-10-CM | POA: Diagnosis not present

## 2021-08-12 DIAGNOSIS — M1712 Unilateral primary osteoarthritis, left knee: Secondary | ICD-10-CM | POA: Diagnosis not present

## 2021-08-12 DIAGNOSIS — M6281 Muscle weakness (generalized): Secondary | ICD-10-CM | POA: Diagnosis not present

## 2021-08-20 DIAGNOSIS — E1169 Type 2 diabetes mellitus with other specified complication: Secondary | ICD-10-CM | POA: Diagnosis not present

## 2021-08-20 DIAGNOSIS — M1712 Unilateral primary osteoarthritis, left knee: Secondary | ICD-10-CM | POA: Diagnosis not present

## 2021-08-20 DIAGNOSIS — R262 Difficulty in walking, not elsewhere classified: Secondary | ICD-10-CM | POA: Diagnosis not present

## 2021-08-20 DIAGNOSIS — I10 Essential (primary) hypertension: Secondary | ICD-10-CM | POA: Diagnosis not present

## 2021-08-20 DIAGNOSIS — E7800 Pure hypercholesterolemia, unspecified: Secondary | ICD-10-CM | POA: Diagnosis not present

## 2021-08-20 DIAGNOSIS — M6281 Muscle weakness (generalized): Secondary | ICD-10-CM | POA: Diagnosis not present

## 2021-08-20 DIAGNOSIS — M25562 Pain in left knee: Secondary | ICD-10-CM | POA: Diagnosis not present

## 2021-08-20 DIAGNOSIS — I482 Chronic atrial fibrillation, unspecified: Secondary | ICD-10-CM | POA: Diagnosis not present

## 2021-08-20 DIAGNOSIS — I5042 Chronic combined systolic (congestive) and diastolic (congestive) heart failure: Secondary | ICD-10-CM | POA: Diagnosis not present

## 2021-08-20 DIAGNOSIS — D500 Iron deficiency anemia secondary to blood loss (chronic): Secondary | ICD-10-CM | POA: Diagnosis not present

## 2021-08-20 DIAGNOSIS — J45909 Unspecified asthma, uncomplicated: Secondary | ICD-10-CM | POA: Diagnosis not present

## 2021-08-22 DIAGNOSIS — I482 Chronic atrial fibrillation, unspecified: Secondary | ICD-10-CM | POA: Diagnosis not present

## 2021-08-22 DIAGNOSIS — I5042 Chronic combined systolic (congestive) and diastolic (congestive) heart failure: Secondary | ICD-10-CM | POA: Diagnosis not present

## 2021-08-22 DIAGNOSIS — M6281 Muscle weakness (generalized): Secondary | ICD-10-CM | POA: Diagnosis not present

## 2021-08-22 DIAGNOSIS — M1712 Unilateral primary osteoarthritis, left knee: Secondary | ICD-10-CM | POA: Diagnosis not present

## 2021-08-25 DIAGNOSIS — M6281 Muscle weakness (generalized): Secondary | ICD-10-CM | POA: Diagnosis not present

## 2021-08-25 DIAGNOSIS — R278 Other lack of coordination: Secondary | ICD-10-CM | POA: Diagnosis not present

## 2021-08-25 DIAGNOSIS — I5042 Chronic combined systolic (congestive) and diastolic (congestive) heart failure: Secondary | ICD-10-CM | POA: Diagnosis not present

## 2021-08-25 DIAGNOSIS — R262 Difficulty in walking, not elsewhere classified: Secondary | ICD-10-CM | POA: Diagnosis not present

## 2021-08-25 DIAGNOSIS — M25562 Pain in left knee: Secondary | ICD-10-CM | POA: Diagnosis not present

## 2021-08-26 DIAGNOSIS — M1712 Unilateral primary osteoarthritis, left knee: Secondary | ICD-10-CM | POA: Diagnosis not present

## 2021-08-26 DIAGNOSIS — M6281 Muscle weakness (generalized): Secondary | ICD-10-CM | POA: Diagnosis not present

## 2021-08-26 DIAGNOSIS — I482 Chronic atrial fibrillation, unspecified: Secondary | ICD-10-CM | POA: Diagnosis not present

## 2021-08-26 DIAGNOSIS — I5042 Chronic combined systolic (congestive) and diastolic (congestive) heart failure: Secondary | ICD-10-CM | POA: Diagnosis not present

## 2021-08-28 DIAGNOSIS — M1712 Unilateral primary osteoarthritis, left knee: Secondary | ICD-10-CM | POA: Diagnosis not present

## 2021-08-28 DIAGNOSIS — I5042 Chronic combined systolic (congestive) and diastolic (congestive) heart failure: Secondary | ICD-10-CM | POA: Diagnosis not present

## 2021-08-28 DIAGNOSIS — I482 Chronic atrial fibrillation, unspecified: Secondary | ICD-10-CM | POA: Diagnosis not present

## 2021-08-28 DIAGNOSIS — M6281 Muscle weakness (generalized): Secondary | ICD-10-CM | POA: Diagnosis not present

## 2021-09-01 DIAGNOSIS — I5042 Chronic combined systolic (congestive) and diastolic (congestive) heart failure: Secondary | ICD-10-CM | POA: Diagnosis not present

## 2021-09-01 DIAGNOSIS — M6281 Muscle weakness (generalized): Secondary | ICD-10-CM | POA: Diagnosis not present

## 2021-09-01 DIAGNOSIS — I482 Chronic atrial fibrillation, unspecified: Secondary | ICD-10-CM | POA: Diagnosis not present

## 2021-09-01 DIAGNOSIS — M1712 Unilateral primary osteoarthritis, left knee: Secondary | ICD-10-CM | POA: Diagnosis not present

## 2021-09-02 DIAGNOSIS — R278 Other lack of coordination: Secondary | ICD-10-CM | POA: Diagnosis not present

## 2021-09-02 DIAGNOSIS — R262 Difficulty in walking, not elsewhere classified: Secondary | ICD-10-CM | POA: Diagnosis not present

## 2021-09-02 DIAGNOSIS — I5042 Chronic combined systolic (congestive) and diastolic (congestive) heart failure: Secondary | ICD-10-CM | POA: Diagnosis not present

## 2021-09-02 DIAGNOSIS — M6281 Muscle weakness (generalized): Secondary | ICD-10-CM | POA: Diagnosis not present

## 2021-09-02 DIAGNOSIS — M25562 Pain in left knee: Secondary | ICD-10-CM | POA: Diagnosis not present

## 2021-09-04 DIAGNOSIS — M6281 Muscle weakness (generalized): Secondary | ICD-10-CM | POA: Diagnosis not present

## 2021-09-04 DIAGNOSIS — M1712 Unilateral primary osteoarthritis, left knee: Secondary | ICD-10-CM | POA: Diagnosis not present

## 2021-09-04 DIAGNOSIS — I482 Chronic atrial fibrillation, unspecified: Secondary | ICD-10-CM | POA: Diagnosis not present

## 2021-09-04 DIAGNOSIS — I5042 Chronic combined systolic (congestive) and diastolic (congestive) heart failure: Secondary | ICD-10-CM | POA: Diagnosis not present

## 2021-09-08 DIAGNOSIS — I482 Chronic atrial fibrillation, unspecified: Secondary | ICD-10-CM | POA: Diagnosis not present

## 2021-09-08 DIAGNOSIS — I5042 Chronic combined systolic (congestive) and diastolic (congestive) heart failure: Secondary | ICD-10-CM | POA: Diagnosis not present

## 2021-09-08 DIAGNOSIS — M6281 Muscle weakness (generalized): Secondary | ICD-10-CM | POA: Diagnosis not present

## 2021-09-08 DIAGNOSIS — M1712 Unilateral primary osteoarthritis, left knee: Secondary | ICD-10-CM | POA: Diagnosis not present

## 2021-09-09 DIAGNOSIS — R278 Other lack of coordination: Secondary | ICD-10-CM | POA: Diagnosis not present

## 2021-09-09 DIAGNOSIS — M6281 Muscle weakness (generalized): Secondary | ICD-10-CM | POA: Diagnosis not present

## 2021-09-09 DIAGNOSIS — I5042 Chronic combined systolic (congestive) and diastolic (congestive) heart failure: Secondary | ICD-10-CM | POA: Diagnosis not present

## 2021-09-10 DIAGNOSIS — I482 Chronic atrial fibrillation, unspecified: Secondary | ICD-10-CM | POA: Diagnosis not present

## 2021-09-10 DIAGNOSIS — M1712 Unilateral primary osteoarthritis, left knee: Secondary | ICD-10-CM | POA: Diagnosis not present

## 2021-09-10 DIAGNOSIS — M6281 Muscle weakness (generalized): Secondary | ICD-10-CM | POA: Diagnosis not present

## 2021-09-10 DIAGNOSIS — I5042 Chronic combined systolic (congestive) and diastolic (congestive) heart failure: Secondary | ICD-10-CM | POA: Diagnosis not present

## 2021-09-10 NOTE — Progress Notes (Unsigned)
[facility]  Grant [name]: Grant [name] DOB: January 31, 1932   REASON FOR VISIT: hx of multiple strokes/TIAs HISTORY FROM: Grant [name] and [name]   No chief complaint on file.     HISTORY OF PRESENT ILLNESS:  Update 09/11/2021 JM: Grant [name] returns for 8-monthfollow-up.  Stable from neurological standpoint.  Has not had any additional episodes of incomprehensible speech.  Remains on Depakote 500 mg daily, denies side effects.  Denies new stroke/TIA symptoms. Aspirin has since been discontinued due to recurrent GI bleed on 5/7 and 6/11 requiring transfusions.  Has not had any additional bleeding concerns since that time. Has remained on simvastatin, denies side effects Blood pressure today ***      History provided for reference purposes only Update 05/12/2021 JM: Grant [name] returns for recent hospital follow-up accompanied by Grant [name].  Presented to ED on 03/08/2021 for 15-minute episode of incomprehensible speech.  Evaluated by Dr. XErlinda Hongwho felt symptoms possibly in setting of TIA vs seizure vs complex migraine.  MRI negative.  CTA head/neck negative LVO.  EF 55 to 60%.  EEG moderate diffuse slowing indicative of global cerebral dysfunction, no epileptiform abnormalities noted.  Recommended aspirin 81 mg daily and initiated Depakote for possible seizure vs complicated migraine prophylaxis. Remained in back brace for recent compression fractures and PT/OT recommended SNF.   Since discharge, Grant [name] has been doing well. Grant [name] has since returned back home - stayed in rehab for 3 weeks.  No reoccurring speech or language difficulty since discharge. Cognition has greatly improved and currently at baseline. Grant [name] completed home health therapy last week - continues to do HEP. Of note, at prior visit, Grant [name] was started on zonisamide for concern of seizure activity with these recurrent speech impairment episodes but apparently Grant [name] stopped as medication as it made him too sleepy. Grant [name] reports compliance on  Depakote 500 mg nightly ('125mg'$  4 caps nightly due to difficulty swallowing tablet) which Grant [name] has been tolerating without side effects. Grant [name] also notes continued issues with RLS - currently takes Requip 3 mg nightly - asks if this can be further increased.  Compliant on aspirin and simvastatin, denies side effects.  Blood pressure today 138/72.  No further concerns at this time.   Update 12/09/2020 JM: Returns for routine stroke follow-up. Called office 05/28/2020 for concern of TIA on 05/25/2020 with speech disturbance that resolved after approximately 20 minutes.  Grant [name] did not seek emergency evaluation. Reports additional episode in June - per [name], speech impairment more severe than prior episodes (eval by EMS but returned to baseline quickly - did not proceed to ED).  Reports mental fatigue after episodes which usually lasts the rest of th day. Concern of events possibly occurring while sleeping as Grant [name] may wake up feeling "fuzzy" in the morning - more recently happening in September.  Similar episode 04/06/2020 (see below) and recommended trialing increased aspirin dose '81mg'$  to 325 mg daily (if cleared by GI) but had recurrent GI bleeds on full dose aspirin (08/2020) as well as hx of recurrent bleeds on Plavix.  Grant [name] has remained on aspirin 81 mg daily without any reoccurring GI bleeds as well as simvastatin 40 mg daily without side effects.  Blood pressure today 128/78.  Routinely follows with PCP for aggressive stroke risk factor management.  Remains on Requip 3 mg nightly for longstanding RLS with occasional worsening symptoms 2-3 nights per month. [name] concerned regarding gradual decline of ambulation due to chronic generalized pain -routinely followed by orthopedics receiving cortisone injections every 3 months.  No further concerns at this time.    Update 04/10/2020 JM: Grant [name] returns for acute visit accompanied by Grant [name] with concern of possible recurrent TIA.  Reports on 04/06/2020 Grant [name] had 20 to 30 minute  episode of slurred speech and slight word finding difficulty Denies weakness, facial droop, visual changes, confusion or AMS, numbness/tingling, or headache Evaluated by EMS - slightly elevated blood pressure but otherwise EKG and other vitals normal No symptoms prior to onset or after event such as fatigue or confusion  Grant [name] has had similar episodes previously in 2010, 2015, 2017 and 2020 dx with TIA vs seizure type episode Grant [name] denies personal prior diagnosis or family history of migraines or confirmed seizures  Grant [name] is currently only on aspirin 81 mg daily as Grant [name] had continued recurrent GI bleeds with acute blood loss anemia while on Plavix most recently in 12/2019.  Plavix was discontinued at that time. Continues to follow with GI personally reviewed OV note and they felt chronic Plavix use possibly contributing to continued recurrent GI bleeds.  Grant [name] has not had any additional bleeding or concerns on aspirin alone.  Grant [name] does question potential need of Plavix  Reports routine lab work with PCP Dr. Delfina Redwood which has been satisfactory (unable to view via epic) Blood pressure routinely monitored which has been stable  No further concerns at this time  Update 10/26/2019 JM:  Grant [name] returns for follow-up regarding history of chronic strokes, TIAs and TIA versus seizure episode Grant [name] is accompanied by Grant [name] Grant [name] has been stable from a neurological standpoint since prior visit without new or reoccurring stroke/TIA symptoms or seizure type symptoms. Residual stroke deficits of left spastic hemiparesis which has been stable not worsening Currently using Requip 2 mg nightly for LLE "jerking" previously working well but more recently Grant [name] has noticed worsening symptoms and is requesting increased Requip dosage Continues to use AFO brace and Rollator walker at all times and denies any recent falls has had ED admissions since prior visit for acute on chronic GI bleed due to diverticulosis requiring transfusions with  most recent admission 10/10/2019.  Recommended to hold Plavix for 1 week.  Grant [name] has since restarted Plavix without evidence of reoccurring bleeding and denies bruising Continues on simvastatin without myalgias Blood pressure today 120/73 Continues to follow closely with PCP for HTN, HLD and DM management No further concerns   Update 09/28/2018 Dr. Leonie Man : Grant [name] returns for follow-up after last visit with me in April 2019.  Grant [name] is accompanied by Grant [name].  Grant [name] had an episode of possible TIA versus seizure in March 2020.  Grant [name] had been admitted for diverticular bleed to the hospital and had been off Plavix for 5 days.  After the Grant [name] was discharged home in the same evening the [name] noticed that Grant [name] had speech difficulties and was unable to speak or verbalize Grant [name] had was able unable to respond to the [name] and a blank look on Grant face.  She called 911 Grant [name] went back to the hospital this episode of speech difficulty lasted 10 to 20 minutes.  Work-up in the emergency room included a CT scan without contrast which did not show acute abnormality.  CT angiogram of the head and neck was obtained which did not show any major stenosis.  MRI scan of the brain was also obtained which showed old basal ganglia infarct without any acute infarct and changes of small vessel disease and generalized atrophy.  Grant [name] has done well since discharge has had no  further recurrent episodes of speech disturbance, TIA or seizures.  Is tolerating Plavix with only minor bruising.  Grant blood pressures well controlled today 121/62.  Grant last hemoglobin A1c was 6.6 checked 2 months ago.  Grant [name] remains on Zocor which is tolerating well without myalgias but does have some knee and foot pain.  Continues to have gait and balance difficulties.  Grant [name] does use a wheeled walker and states Grant [name] is careful and has not had any major falls or injuries.  Grant [name] is having dysphagia and plans to have elective esophageal dilatation to be done by Dr. Ardis Hughs on 10/07/2018  and wants neurological clearance and to hold Plavix for the procedure.  Update the 06/09/2017 Dr. Leonie Man :Grant [name] returns for follow-up after last for this 6 months ago. Grant [name] is accompanied by Grant [name]. Grant [name] has not had any recurrent stroke or TIA symptoms. Grant [name] continues to have left leg weakness and difficulty with gait and balance. Grant [name] does use a wheeled walker all the time. Grant [name] has not had any falls or injuries recently. Grant [name] states Grant memory difficulties unchanged and are not progressive. Grant remains on Plavix which is tolerating well with only minor bruising but no bleeding. States Grant blood pressure is under good control today it is 124/72. Grant [name] is tolerating Zocor well and last lipid profile checked for satisfactory by Grant primary physician. Grant [name] was diagnosed with pneumonia 2 weeks ago by Grant primary physician and given a course of antibiotics and is now improving.  Update 11/25/2016 Dr. Leonie Man :  Grant [name] returns for follow-up after last visit 4 months ago. Grant [name] is accompanied by Grant [name]. Grant abnormal further recurrent TIA or stroke symptoms. Grant [name] had to stop Plavix for a week as Grant [name] had some rectal bleed which was felt to be diverticular bleed. Grant [name] has since resumed Plavix and is doing all right without further bleeding problems. Grant [name] continues to walk with a walker due to stiffness and weakness in left leg as well as left knee pain which is bothersome. Grant [name] recently got intra-articular injection by Dr.: Into Grant left knee. Uses a wheeled walker regularly. Grant [name] states is careful and has not had any recent falls. Grant [name] is complaining of mild short-term memory difficulties as well as double finding words and completing sentences. Grant [name] does not participate in any mentally challenging activities.  UPDATE 07/22/2016.CM Mr. Huelsmann, 88 year old male returns for follow-up with possible TIA event over the weekend where Grant [name] had slurred speech and right hand numbness for 30 minutes. Grant [name] is currently on aspirin 325 mg daily. Grant [name] is on Zocor for  hyperlipidemia and is due to have labs next week Grant primary care. Blood pressure in the office today 149/78 Grant [name] is continuing to get physical therapy 2 times a week for left lower extremity weakness since admission for stroke and 02/22/2016 . Carotid Doppler at that time 1-39% bilateral ICA stenosis negative MRA of the head MRI with right lateral thalamus and right posterior limb internal capsule infarct. Grant [name] had another admission to the ER on 05/04/2016 for aphasia/ TIA. MRI without acute infarct. Marland Kitchen Grant [name] lives in independent living at [facility]. Grant [name] returns for reevaluation  HISTORY 07/2015 Dr. Cyndra Numbers had acute onset of dizziness on 07/25/15 and was admitted to [facility]. MRI showed tiny acute infarct in the superior left occipital lobe. CTA head and neck unremarkable. EF 45-50%, A1C 6.5, LDL not checked. Grant investigational meds were discontinued. Grant [name] was put on plavix. Further embolic work up deferred as Grant [name] would like to be discharged  the 2nd day.    Discussed with pt and Grant [name] that Grant [name] had recurrent episodes of word finding difficulty. MRI on 12/22/13 showed acute infarct at left temporal cortical region. Grant [name] was enrolled to RESPECT ESUS trial to compare ASA vs. pradaxa in 04/2014. However, Grant [name] had another TIA episode with word finding difficulty and MRI negative. Grant [name] was managed to be remained in the trial. This time Grant symptoms are different from previous, but MRI showed acute tiny infarct at left occipital lobe. Grant [name] had 30 day cardiac monitoring in the past with Dr. Wynonia Lawman was told negative for afib. During to recurrent episodes and embolic pattern, we can either put him on coumadin and INR 2-3 or continue plavix and check TEE and consider loop recorder. Pros and cons of either approach have been discussed with pt and [name]. Pt more leaning towards coumadin and [name] leaning toward the other. They would like to further discuss with PCP Dr. Delfina Redwood and let me know. I will forward the note to Dr. Delfina Redwood today and they will  call him next week.          REVIEW OF SYSTEMS: Full 14 system review of systems performed and notable only for those listed in HPI, all others are neg   ALLERGIES: Allergies  Allergen Reactions   Tape Other (See Comments)    Pt has sensitive skin, skin tears and bruises easily - please use paper tape.    HOME MEDICATIONS: Outpatient Medications Prior to Visit  Medication Sig Dispense Refill   acetaminophen (TYLENOL) 325 MG tablet Take 2 tablets (650 mg total) by mouth every 6 (six) hours as needed for mild pain, fever or headache. (Grant [name] taking differently: Take 650 mg by mouth 2 (two) times daily.) 100 tablet 2   albuterol (VENTOLIN HFA) 108 (90 Base) MCG/ACT inhaler Inhale 2 puffs into the lungs every 6 (six) hours as needed for wheezing or shortness of breath.     budesonide (PULMICORT) 0.5 MG/2ML nebulizer solution Take 2 mLs by nebulization See admin instructions. Inhale 2 ml via nebulizer daily. May repeat once if needed.     carvedilol (COREG) 3.125 MG tablet Take 1 tablet (3.125 mg total) by mouth 2 (two) times daily with a meal.     Cholecalciferol (VITAMIN D-3 PO) Take 1 tablet by mouth in the morning.     divalproex (DEPAKOTE SPRINKLE) 125 MG capsule Take 500 mg by mouth at bedtime.     ferrous sulfate 325 (65 FE) MG tablet Take 1 tablet (325 mg total) by mouth 2 (two) times daily. 30 tablet 1   MELATONIN PO Take 1 tablet by mouth daily.     metFORMIN (GLUCOPHAGE) 500 MG tablet Take 500 mg by mouth daily.     pantoprazole (PROTONIX) 40 MG tablet Take 1 tablet (40 mg total) by mouth daily with breakfast. 90 tablet 3   polyethylene glycol (MIRALAX / GLYCOLAX) 17 g packet Take 17 g by mouth daily.     predniSONE (DELTASONE) 10 MG tablet Take 5 mg by mouth daily.      psyllium (METAMUCIL) 58.6 % packet Take 1 packet by mouth daily.     rOPINIRole (REQUIP) 3 MG tablet Take 3 mg by mouth at bedtime.     simvastatin (ZOCOR) 40 MG tablet Take 1 tablet (40 mg total) by mouth  daily. (Grant [name] taking differently: Take 40 mg by mouth at bedtime.) 30 tablet 3   sodium chloride (OCEAN) 0.65 % SOLN nasal spray Place 1 spray into  both nostrils 2 (two) times daily as needed (dryness).     traMADol (ULTRAM) 50 MG tablet Take 1 tablet (50 mg total) by mouth daily as needed for moderate pain. (Grant [name] taking differently: Take 50 mg by mouth at bedtime.) 10 tablet 0   No facility-administered medications prior to visit.    PAST MEDICAL HISTORY: Past Medical History:  Diagnosis Date   Acute CVA (cerebrovascular accident) ([place]) 07/25/2015   Asthma    Benign essential HTN    Cerebral infarction due to embolism of left middle cerebral artery ([place]) 02/03/2014   Chronic combined systolic and diastolic CHF (congestive heart failure) ([place]) 12/23/2017   Colitis    Colon polyps    Diabetes mellitus type 2, diet-controlled ([place]) 12/22/2013   Diverticulosis    GERD (gastroesophageal reflux disease)    GI bleed    GI bleed    Hemorrhoids    Hiatal hernia    History of esophageal strciture    HLD (hyperlipidemia)    Osteoarthritis    Proctitis    Status post dilation of esophageal narrowing    Stroke [facility])     PAST SURGICAL HISTORY: Past Surgical History:  Procedure Laterality Date   ABDOMINAL HERNIA REPAIR     BIOPSY  04/05/2019   Procedure: BIOPSY;  Surgeon: Thornton Egloff, MD;  Location: [place];  Service: Gastroenterology;;   COLONOSCOPY     multiple   COLONOSCOPY WITH PROPOFOL N/A 12/12/2016   Procedure: COLONOSCOPY WITH PROPOFOL;  Surgeon: Gatha Mayer, MD;  Location: [facility] ENDOSCOPY;  Service: Endoscopy;  Laterality: N/A;   COLONOSCOPY WITH PROPOFOL N/A 04/04/2019   Procedure: COLONOSCOPY WITH PROPOFOL;  Surgeon: Milus Banister, MD;  Location: [facility] ENDOSCOPY;  Service: Endoscopy;  Laterality: N/A;   ENTEROSCOPY N/A 01/16/2020   Procedure: ENTEROSCOPY;  Surgeon: Rush Landmark Telford Nab., MD;  Location: [facility] ENDOSCOPY;  Service: Gastroenterology;  Laterality: N/A;    ENTEROSCOPY N/A 01/20/2020   Procedure: ENTEROSCOPY;  Surgeon: Rush Landmark Telford Nab., MD;  Location: [facility] ENDOSCOPY;  Service: Gastroenterology;  Laterality: N/A;   ENTEROSCOPY N/A 09/16/2020   Procedure: ENTEROSCOPY;  Surgeon: Ladene Artist, MD;  Location: [facility] ENDOSCOPY;  Service: Endoscopy;  Laterality: N/A;  push enteroscopy   ESOPHAGOGASTRODUODENOSCOPY     multiple   ESOPHAGOGASTRODUODENOSCOPY (EGD) WITH PROPOFOL N/A 04/05/2019   Procedure: ESOPHAGOGASTRODUODENOSCOPY (EGD) WITH PROPOFOL;  Surgeon: Thornton Weddington, MD;  Location: [place];  Service: Gastroenterology;  Laterality: N/A;   ESOPHAGOGASTRODUODENOSCOPY (EGD) WITH PROPOFOL N/A 01/18/2020   Procedure: ESOPHAGOGASTRODUODENOSCOPY (EGD) WITH PROPOFOL;  Surgeon: Doran Stabler, MD;  Location: [facility] ENDOSCOPY;  Service: Gastroenterology;  Laterality: N/A;  For placement of video capsule study   GIVENS CAPSULE STUDY N/A 01/16/2020   Procedure: [place];  Surgeon: Irving Copas., MD;  Location: [facility] ENDOSCOPY;  Service: Gastroenterology;  Laterality: N/A;   GIVENS CAPSULE STUDY N/A 01/18/2020   Procedure: GIVENS CAPSULE STUDY;  Surgeon: Doran Stabler, MD;  Location: [facility] ENDOSCOPY;  Service: Gastroenterology;  Laterality: N/A;   HOT HEMOSTASIS N/A 01/20/2020   Procedure: HOT HEMOSTASIS (ARGON PLASMA COAGULATION/BICAP);  Surgeon: Irving Copas., MD;  Location: [facility]OS[facility];  Service: Gastroenterology;  Laterality: N/A;   HOT HEMOSTASIS N/A 09/16/2020   Procedure: HOT HEMOSTASIS (ARGON PLASMA COAGULATION/BICAP);  Surgeon: Ladene Artist, MD;  Location: [facility];  Service: Endoscopy;  Laterality: N/A;   INSERTION OF MESH  01/29/2012   Procedure: INSERTION OF MESH;  Surgeon: Gwenyth Ober, MD;  Location: [place];  Service: General;  Laterality: N/A;   IR ANGIOGRAM SELECTIVE EACH ADDITIONAL VESSEL  04/01/2019   IR ANGIOGRAM VISCERAL SELECTIVE  04/01/2019   IR ANGIOGRAM VISCERAL SELECTIVE  04/01/2019   IR  ANGIOGRAM VISCERAL SELECTIVE  04/01/2019   IR US GUIDE VASC ACCESS RIGHT  06/04/6220   NISSEN FUNDOPLICATION     SAVORY DILATION N/A 01/16/2020   Procedure: SAVORY DILATION;  Surgeon: Irving Copas., MD;  Location: [facility] ENDOSCOPY;  Service: Gastroenterology;  Laterality: N/A;   SAVORY DILATION N/A 09/16/2020   Procedure: SAVORY DILATION;  Surgeon: Ladene Artist, MD;  Location: [facility] ENDOSCOPY;  Service: Endoscopy;  Laterality: N/A;   SCLEROTHERAPY  01/20/2020   Procedure: SCLEROTHERAPY;  Surgeon: Mansouraty, Telford Nab., MD;  Location: [facility];  Service: Gastroenterology;;   SPLENECTOMY, TOTAL     nontraumatic rupture   SUBMUCOSAL TATTOO INJECTION  01/16/2020   Procedure: SUBMUCOSAL TATTOO INJECTION;  Surgeon: Irving Copas., MD;  Location: [facility];  Service: Gastroenterology;;   VENTRAL HERNIA REPAIR  01/29/2012    WITH MESH   VENTRAL HERNIA REPAIR  01/29/2012   Procedure: HERNIA REPAIR VENTRAL ADULT;  Surgeon: Gwenyth Ober, MD;  Location: [place];  Service: General;  Laterality: N/A;  open recurrent ventral hernia repair with mesh    FAMILY HISTORY: Family History  Problem Relation Age of Onset   Alzheimer's disease Mother    Breast cancer Mother    Throat cancer Father    Colon cancer Neg Hx    Colon polyps Neg Hx    Pancreatic cancer Neg Hx    Stomach cancer Neg Hx    Liver disease Neg Hx    Esophageal cancer Neg Hx    Rectal cancer Neg Hx     SOCIAL HISTORY: Social History   Socioeconomic History   Marital status: Married    Spouse name: [name]   Number of children: 2   Years of education: Bachelor's   Highest education level: Not on file  Occupational History   Occupation: retired  Tobacco Use   Smoking status: Never   Smokeless tobacco: Never  Vaping Use   Vaping Use: Never used  Substance and Sexual Activity   Alcohol use: Yes    Alcohol/week: 2.0 standard drinks of alcohol    Types: 2 Glasses of wine per week    Comment: daily   Drug  use: No   Sexual activity: Not Currently  Other Topics Concern   Not on file  Social History Narrative   Grant [name] is married and has 2 children.   Grant [name] is right handed.   Grant [name] has Bachelor's degree.   Grant [name] drinks 2 cups daily.   Social Determinants of Health   Financial Resource Strain: Not on file  Food Insecurity: Not on file  Transportation Needs: Not on file  Physical Activity: Not on file  Stress: Not on file  Social Connections: Not on file  Intimate Partner Violence: Not on file     PHYSICAL EXAM  There were no vitals filed for this visit.   There is no height or weight on file to calculate BMI.  Generalized: frail very pleasant elderly Caucasian male, in no acute distress  Head: normocephalic and atraumatic,.   Neck: Supple, no carotid bruits  Cardiac: Regular rate rhythm, no murmur  Musculoskeletal: No deformity . Mild kyphosis  Neurological examination   Mentation: Alert oriented to time, place, history taking. Attention span and concentration appropriate. Recent and remote memory intact.  Follows all commands.  Speech and language  fluent. Diminished recall - stable Cranial nerve II-XII: Pupils were equal round reactive to light extraocular movements were full, visual field were full on confrontational test. Facial sensation and strength were normal. hearing was intact to finger rubbing bilaterally. Uvula tongue midline. head turning and shoulder shrug were normal and symmetric.Tongue protrusion into cheek strength was normal. Motor: full strength right upper and lower extremity with normal bulk and tone, Mild LUE grip weakness and intrinsic hand muscles. Orbits right arm over left.  Fine finger movements are diminished on the left. LLE 4+/5 hip flexor and 4/5 ankle dorsiflexion weakness (chronic, stable) Sensory: normal and symmetric to light touch, in the upper and lower extremities Coordination: finger-nose-finger performed accurately on right  side Reflexes: Brisker in the left upper and lower, plantar responses were flexor bilaterally. Gait and Station: Rising up from seated position with push off. uses a Rollator walker with mild favoring of left leg   DIAGNOSTIC DATA (LABS, IMAGING, TESTING) - I reviewed Grant [name] records, labs, notes, testing and imaging myself where available.  Lab Results  Component Value Date   WBC 10.5 07/31/2021   HGB 8.3 (L) 07/31/2021   HCT 26.1 (L) 07/31/2021   MCV 89.7 07/31/2021   PLT 244 07/31/2021      Component Value Date/Time   NA 137 07/30/2021 0234   NA 136 05/12/2021 1438   K 3.8 07/30/2021 0234   CL 107 07/30/2021 0234   CO2 27 07/30/2021 0234   GLUCOSE 108 (H) 07/30/2021 0234   BUN 22 07/30/2021 0234   BUN 15 05/12/2021 1438   CREATININE 0.72 07/30/2021 0234   CREATININE 0.74 02/08/2012 1209   CALCIUM 7.6 (L) 07/30/2021 0234   PROT 4.5 (L) 07/30/2021 0234   PROT 6.0 05/12/2021 1438   ALBUMIN 2.6 (L) 07/30/2021 0234   ALBUMIN 4.0 05/12/2021 1438   AST 13 (L) 07/30/2021 0234   ALT 13 07/30/2021 0234   ALKPHOS 33 (L) 07/30/2021 0234   BILITOT 0.5 07/30/2021 0234   BILITOT <0.2 05/12/2021 1438   GFRNONAA >60 07/30/2021 0234   GFRAA >60 10/13/2019 0536   Lab Results  Component Value Date   CHOL 111 03/09/2021   HDL 50 03/09/2021   LDLCALC 43 03/09/2021   TRIG 88 03/09/2021   CHOLHDL 2.2 03/09/2021   Lab Results  Component Value Date   HGBA1C 6.3 (H) 03/09/2021     ASSESSMENT AND PLAN  88 y.o. 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  Recurrent slurred speech  -recurrent recent event - placed on Depakote 500 mg nightly - advised to  continue  -obtain CBC, CMP and valproic acid level  -Intolerant to zonisamide due to increased fatigue  -Possible TIA vs seizure vs complicated migraine although lower suspicion  -Continues aspirin 81 mg daily with hx of recurrent GI bleeds on aspirin 325 mg daily and Plavix  -Continue routine follow-up with PCP for aggressive stroke risk factor management   2.  History of multiple strokes/TIAs  -See above   3.  RLS  -increase Requip to 4 mg nightly for continued symptoms interfering with sleep  -Reviewed potential side effects with increased dosage and to call with  any potential side effects or concerns    Follow-up in 4 months or call earlier if needed    CC:  Seward Carol, MD  (PCP) Dr. Leonie Man    I spent 38 minutes of face-to-face and non-face-to-face time with Grant [name] and [name].  This included previsit chart review including review of recent hospitalization, lab review, study review, order entry, electronic health record documentation, Grant [name] education and discussion regarding recurrent slurred speech and possible etiologies and current medication usage., history of multiple strokes/TIAs and importance of continued stroke risk factor management, RLS concerns and answered all other questions to Grant [name] and [name] satisfaction  Frann Rider, AGNP-BC  [facility] [address] [place], [place] [address]  Phone [phone] Fax [phone] Note: This document was prepared with digital dictation and possible smart phrase technology. Any transcriptional errors that result from this process are unintentional.

## 2021-09-11 ENCOUNTER — Encounter: Payer: Self-pay | Admitting: Adult Health

## 2021-09-11 ENCOUNTER — Ambulatory Visit (INDEPENDENT_AMBULATORY_CARE_PROVIDER_SITE_OTHER): Payer: Medicare Other | Admitting: Adult Health

## 2021-09-11 VITALS — BP 127/74 | HR 73

## 2021-09-11 DIAGNOSIS — Z5181 Encounter for therapeutic drug level monitoring: Secondary | ICD-10-CM | POA: Diagnosis not present

## 2021-09-11 DIAGNOSIS — R479 Unspecified speech disturbances: Secondary | ICD-10-CM | POA: Diagnosis not present

## 2021-09-11 DIAGNOSIS — G2581 Restless legs syndrome: Secondary | ICD-10-CM | POA: Diagnosis not present

## 2021-09-11 DIAGNOSIS — R5381 Other malaise: Secondary | ICD-10-CM

## 2021-09-11 DIAGNOSIS — E538 Deficiency of other specified B group vitamins: Secondary | ICD-10-CM

## 2021-09-11 DIAGNOSIS — E611 Iron deficiency: Secondary | ICD-10-CM | POA: Diagnosis not present

## 2021-09-11 DIAGNOSIS — D509 Iron deficiency anemia, unspecified: Secondary | ICD-10-CM | POA: Diagnosis not present

## 2021-09-11 DIAGNOSIS — R4189 Other symptoms and signs involving cognitive functions and awareness: Secondary | ICD-10-CM

## 2021-09-11 MED ORDER — ROPINIROLE HCL 4 MG PO TABS: 4.0000 mg | ORAL_TABLET | Freq: Every day | ORAL | 5 refills | Status: DC

## 2021-09-11 NOTE — Patient Instructions (Addendum)
We will check lab work today that could impact your memory and energy levels  Continue depakote '500mg'$  nightly for now - please call with any additional events   Continue Requip '4mg'$  nightly for restless legs  Continue aspirin 81 mg daily  and simvastatin  for secondary stroke prevention  Continue to follow up with PCP regarding blood pressure and cholesterol management  Maintain strict control of hypertension with blood pressure goal below 130/90 and cholesterol with LDL cholesterol (bad cholesterol) goal below 70 mg/dL.   Signs of a Stroke? Follow the BEFAST method:  Balance Watch for a sudden loss of balance, trouble with coordination or vertigo Eyes Is there a sudden loss of vision in one or both eyes? Or double vision?  Face: Ask the person to smile. Does one side of the face droop or is it numb?  Arms: Ask the person to raise both arms. Does one arm drift downward? Is there weakness or numbness of a leg? Speech: Ask the person to repeat a simple phrase. Does the speech sound slurred/strange? Is the person confused ? Time: If you observe any of these signs, call 911.     Followup in the future with me in 3 months or call earlier if needed       Thank you for coming to see [place] at [facility]. I hope we have been able to provide you high quality care today.  You may receive a patient satisfaction survey over the next few weeks. We would appreciate your feedback and comments so that we may continue to improve ourselves and the health of our patients.

## 2021-09-12 LAB — B12 AND FOLATE PANEL
Folate: 14.6 ng/mL (ref >3.0)
Vitamin B-12: 362 pg/mL (ref 232–1245)

## 2021-09-12 LAB — IRON,TIBC AND FERRITIN PANEL
Ferritin: 50 ng/mL (ref 30–400)
Iron Saturation: 35 % (ref 15–55)
Iron: 118 ug/dL (ref 38–169)
Total Iron Binding Capacity: 336 ug/dL (ref 250–450)
UIBC: 218 ug/dL (ref 111–343)

## 2021-09-12 LAB — VALPROIC ACID LEVEL: Valproic Acid Lvl: 35 ug/mL — ABNORMAL LOW (ref 50–100)

## 2021-09-16 DIAGNOSIS — M6281 Muscle weakness (generalized): Secondary | ICD-10-CM | POA: Diagnosis not present

## 2021-09-16 DIAGNOSIS — M25562 Pain in left knee: Secondary | ICD-10-CM | POA: Diagnosis not present

## 2021-09-16 DIAGNOSIS — I482 Chronic atrial fibrillation, unspecified: Secondary | ICD-10-CM | POA: Diagnosis not present

## 2021-09-16 DIAGNOSIS — R278 Other lack of coordination: Secondary | ICD-10-CM | POA: Diagnosis not present

## 2021-09-16 DIAGNOSIS — I5042 Chronic combined systolic (congestive) and diastolic (congestive) heart failure: Secondary | ICD-10-CM | POA: Diagnosis not present

## 2021-09-16 DIAGNOSIS — M1712 Unilateral primary osteoarthritis, left knee: Secondary | ICD-10-CM | POA: Diagnosis not present

## 2021-09-16 DIAGNOSIS — R262 Difficulty in walking, not elsewhere classified: Secondary | ICD-10-CM | POA: Diagnosis not present

## 2021-09-25 DIAGNOSIS — M25562 Pain in left knee: Secondary | ICD-10-CM | POA: Diagnosis not present

## 2021-09-25 DIAGNOSIS — M6281 Muscle weakness (generalized): Secondary | ICD-10-CM | POA: Diagnosis not present

## 2021-09-25 DIAGNOSIS — M2352 Chronic instability of knee, left knee: Secondary | ICD-10-CM | POA: Diagnosis not present

## 2021-09-25 DIAGNOSIS — R278 Other lack of coordination: Secondary | ICD-10-CM | POA: Diagnosis not present

## 2021-09-25 DIAGNOSIS — I5042 Chronic combined systolic (congestive) and diastolic (congestive) heart failure: Secondary | ICD-10-CM | POA: Diagnosis not present

## 2021-09-25 DIAGNOSIS — R262 Difficulty in walking, not elsewhere classified: Secondary | ICD-10-CM | POA: Diagnosis not present

## 2021-09-29 ENCOUNTER — Ambulatory Visit: Payer: Medicare Other | Admitting: Gastroenterology

## 2021-09-30 ENCOUNTER — Inpatient Hospital Stay (HOSPITAL_COMMUNITY): Payer: Medicare Other

## 2021-09-30 ENCOUNTER — Encounter (HOSPITAL_COMMUNITY): Payer: Self-pay

## 2021-09-30 ENCOUNTER — Observation Stay (HOSPITAL_BASED_OUTPATIENT_CLINIC_OR_DEPARTMENT_OTHER)
Admission: EM | Admit: 2021-09-30 | Discharge: 2021-10-01 | Disposition: A | Discharge status: Home / Self Care | Payer: Medicare Other | Attending: Family Medicine | Admitting: Family Medicine

## 2021-09-30 ENCOUNTER — Encounter (HOSPITAL_BASED_OUTPATIENT_CLINIC_OR_DEPARTMENT_OTHER): Payer: Self-pay | Admitting: Emergency Medicine

## 2021-09-30 ENCOUNTER — Other Ambulatory Visit: Payer: Self-pay

## 2021-09-30 ENCOUNTER — Emergency Department (HOSPITAL_BASED_OUTPATIENT_CLINIC_OR_DEPARTMENT_OTHER): Payer: Medicare Other

## 2021-09-30 DIAGNOSIS — I5042 Chronic combined systolic (congestive) and diastolic (congestive) heart failure: Secondary | ICD-10-CM | POA: Insufficient documentation

## 2021-09-30 DIAGNOSIS — I480 Paroxysmal atrial fibrillation: Secondary | ICD-10-CM | POA: Diagnosis not present

## 2021-09-30 DIAGNOSIS — I10 Essential (primary) hypertension: Secondary | ICD-10-CM | POA: Diagnosis not present

## 2021-09-30 DIAGNOSIS — Z7982 Long term (current) use of aspirin: Secondary | ICD-10-CM | POA: Insufficient documentation

## 2021-09-30 DIAGNOSIS — J45909 Unspecified asthma, uncomplicated: Secondary | ICD-10-CM | POA: Insufficient documentation

## 2021-09-30 DIAGNOSIS — Z7984 Long term (current) use of oral hypoglycemic drugs: Secondary | ICD-10-CM | POA: Insufficient documentation

## 2021-09-30 DIAGNOSIS — R410 Disorientation, unspecified: Secondary | ICD-10-CM | POA: Diagnosis not present

## 2021-09-30 DIAGNOSIS — G40919 Epilepsy, unspecified, intractable, without status epilepticus: Secondary | ICD-10-CM | POA: Diagnosis present

## 2021-09-30 DIAGNOSIS — R569 Unspecified convulsions: Secondary | ICD-10-CM | POA: Diagnosis not present

## 2021-09-30 DIAGNOSIS — Z79899 Other long term (current) drug therapy: Secondary | ICD-10-CM | POA: Insufficient documentation

## 2021-09-30 DIAGNOSIS — I110 Hypertensive heart disease with heart failure: Secondary | ICD-10-CM | POA: Diagnosis not present

## 2021-09-30 DIAGNOSIS — E119 Type 2 diabetes mellitus without complications: Secondary | ICD-10-CM | POA: Diagnosis not present

## 2021-09-30 DIAGNOSIS — G459 Transient cerebral ischemic attack, unspecified: Secondary | ICD-10-CM | POA: Insufficient documentation

## 2021-09-30 DIAGNOSIS — I6381 Other cerebral infarction due to occlusion or stenosis of small artery: Secondary | ICD-10-CM | POA: Diagnosis not present

## 2021-09-30 DIAGNOSIS — R4701 Aphasia: Secondary | ICD-10-CM | POA: Diagnosis not present

## 2021-09-30 DIAGNOSIS — R2990 Unspecified symptoms and signs involving the nervous system: Secondary | ICD-10-CM | POA: Insufficient documentation

## 2021-09-30 DIAGNOSIS — K31819 Angiodysplasia of stomach and duodenum without bleeding: Secondary | ICD-10-CM | POA: Insufficient documentation

## 2021-09-30 LAB — COMPREHENSIVE METABOLIC PANEL
ALT: 13 U/L (ref 0–44)
AST: 22 U/L (ref 15–41)
Albumin: 3.6 g/dL (ref 3.5–5.0)
Alkaline Phosphatase: 64 U/L (ref 38–126)
Anion gap: 10 (ref 5–15)
BUN: 15 mg/dL (ref 8–23)
CO2: 27 mmol/L (ref 22–32)
Calcium: 8.7 mg/dL — ABNORMAL LOW (ref 8.9–10.3)
Chloride: 99 mmol/L (ref 98–111)
Creatinine, Ser: 0.87 mg/dL (ref 0.61–1.24)
GFR, Estimated: >60 mL/min (ref >60)
Glucose, Bld: 126 mg/dL — ABNORMAL HIGH (ref 70–99)
Potassium: 3.6 mmol/L (ref 3.5–5.1)
Sodium: 136 mmol/L (ref 135–145)
Total Bilirubin: 0.6 mg/dL (ref 0.3–1.2)
Total Protein: 7 g/dL (ref 6.5–8.1)

## 2021-09-30 LAB — CBC
HCT: 47.9 % (ref 39.0–52.0)
HCT: 46.3 % (ref 39.0–52.0)
Hemoglobin: 15.1 g/dL (ref 13.0–17.0)
Hemoglobin: 15.2 g/dL (ref 13.0–17.0)
MCH: 29.9 pg (ref 26.0–34.0)
MCH: 30.1 pg (ref 26.0–34.0)
MCHC: 31.5 g/dL (ref 30.0–36.0)
MCHC: 32.8 g/dL (ref 30.0–36.0)
MCV: 94.9 fL (ref 80.0–100.0)
MCV: 91.7 fL (ref 80.0–100.0)
Platelets: 340 10*3/uL (ref 150–400)
Platelets: 337 10*3/uL (ref 150–400)
RBC: 5.05 MIL/uL (ref 4.22–5.81)
RBC: 5.05 MIL/uL (ref 4.22–5.81)
RDW: 17.8 % — ABNORMAL HIGH (ref 11.5–15.5)
RDW: 17.3 % — ABNORMAL HIGH (ref 11.5–15.5)
WBC: 11.6 10*3/uL — ABNORMAL HIGH (ref 4.0–10.5)
WBC: 13 10*3/uL — ABNORMAL HIGH (ref 4.0–10.5)
nRBC: 0 % (ref 0.0–0.2)
nRBC: 0 % (ref 0.0–0.2)

## 2021-09-30 LAB — DIFFERENTIAL
Abs Immature Granulocytes: 0.04 10*3/uL (ref 0.00–0.07)
Basophils Absolute: 0.1 10*3/uL (ref 0.0–0.1)
Basophils Relative: 1 %
Eosinophils Absolute: 0.9 10*3/uL — ABNORMAL HIGH (ref 0.0–0.5)
Eosinophils Relative: 8 %
Immature Granulocytes: 0 %
Lymphocytes Relative: 12 %
Lymphs Abs: 1.4 10*3/uL (ref 0.7–4.0)
Monocytes Absolute: 0.8 10*3/uL (ref 0.1–1.0)
Monocytes Relative: 7 %
Neutro Abs: 8.4 10*3/uL — ABNORMAL HIGH (ref 1.7–7.7)
Neutrophils Relative %: 72 %

## 2021-09-30 LAB — URINALYSIS, ROUTINE W REFLEX MICROSCOPIC
Bilirubin Urine: NEGATIVE
Glucose, UA: NEGATIVE mg/dL
Hgb urine dipstick: NEGATIVE
Ketones, ur: NEGATIVE mg/dL
Leukocytes,Ua: NEGATIVE
Nitrite: NEGATIVE
Protein, ur: NEGATIVE mg/dL
Specific Gravity, Urine: 1.02 (ref 1.005–1.030)
pH: 7 (ref 5.0–8.0)

## 2021-09-30 LAB — LIPID PANEL
Cholesterol: 126 mg/dL (ref 0–200)
HDL: 54 mg/dL (ref >40)
LDL Cholesterol: 57 mg/dL (ref 0–99)
Total CHOL/HDL Ratio: 2.3 RATIO
Triglycerides: 77 mg/dL (ref <150)
VLDL: 15 mg/dL (ref 0–40)

## 2021-09-30 LAB — HEMOGLOBIN A1C
Hgb A1c MFr Bld: 5.3 % (ref 4.8–5.6)
Mean Plasma Glucose: 105.41 mg/dL

## 2021-09-30 LAB — GLUCOSE, CAPILLARY: Glucose-Capillary: 110 mg/dL — ABNORMAL HIGH (ref 70–99)

## 2021-09-30 LAB — VALPROIC ACID LEVEL: Valproic Acid Lvl: 37 ug/mL — ABNORMAL LOW (ref 50.0–100.0)

## 2021-09-30 LAB — CREATININE, SERUM
Creatinine, Ser: 0.68 mg/dL (ref 0.61–1.24)
GFR, Estimated: >60 mL/min (ref >60)

## 2021-09-30 LAB — CBG MONITORING, ED: Glucose-Capillary: 181 mg/dL — ABNORMAL HIGH (ref 70–99)

## 2021-09-30 LAB — PROTIME-INR
INR: 1 (ref 0.8–1.2)
Prothrombin Time: 12.6 seconds (ref 11.4–15.2)

## 2021-09-30 LAB — APTT: aPTT: 27 seconds (ref 24–36)

## 2021-09-30 MED ORDER — ACETAMINOPHEN 650 MG RE SUPP: 650.0000 mg | RECTAL | Status: DC | PRN

## 2021-09-30 MED ORDER — ACETAMINOPHEN 160 MG/5ML PO SOLN: 650.0000 mg | ORAL | Status: DC | PRN

## 2021-09-30 MED ORDER — ASPIRIN 81 MG PO TBEC
81.0000 mg | DELAYED_RELEASE_TABLET | Freq: Every day | ORAL | Status: DC
  Administered 2021-10-01: 81 mg via ORAL
  Filled 2021-09-30: qty 1

## 2021-09-30 MED ORDER — STROKE: EARLY STAGES OF RECOVERY BOOK
Freq: Once | Status: AC
  Filled 2021-09-30: qty 1

## 2021-09-30 MED ORDER — HYDRALAZINE HCL 20 MG/ML IJ SOLN
10.0000 mg | INTRAMUSCULAR | Status: DC | PRN
Start: 2021-09-30 — End: 2021-10-01

## 2021-09-30 MED ORDER — SIMVASTATIN 20 MG PO TABS
40.0000 mg | ORAL_TABLET | Freq: Every day | ORAL | Status: DC
  Administered 2021-09-30: 40 mg via ORAL
  Filled 2021-09-30: qty 2

## 2021-09-30 MED ORDER — ALBUTEROL SULFATE (2.5 MG/3ML) 0.083% IN NEBU
INHALATION_SOLUTION | RESPIRATORY_TRACT | Status: AC
  Administered 2021-09-30: 2.5 mg
  Filled 2021-09-30: qty 3

## 2021-09-30 MED ORDER — ACETAMINOPHEN 325 MG PO TABS
650.0000 mg | ORAL_TABLET | ORAL | Status: DC | PRN
  Administered 2021-09-30: 650 mg via ORAL
  Filled 2021-09-30: qty 2

## 2021-09-30 MED ORDER — PANTOPRAZOLE SODIUM 40 MG PO TBEC
40.0000 mg | DELAYED_RELEASE_TABLET | Freq: Every day | ORAL | Status: DC
  Administered 2021-10-01: 40 mg via ORAL
  Filled 2021-09-30: qty 1

## 2021-09-30 MED ORDER — INSULIN ASPART 100 UNIT/ML IJ SOLN: 0.0000 [IU] | Freq: Three times a day (TID) | INTRAMUSCULAR | Status: DC

## 2021-09-30 MED ORDER — HEPARIN SODIUM (PORCINE) 5000 UNIT/ML IJ SOLN
5000.0000 [IU] | Freq: Three times a day (TID) | INTRAMUSCULAR | Status: DC
  Administered 2021-09-30 – 2021-10-01 (×2): 5000 [IU] via SUBCUTANEOUS
  Filled 2021-09-30 (×2): qty 1

## 2021-09-30 NOTE — ED Provider Notes (Signed)
[facility] EMERGENCY DEPARTMENT Provider Note   CSN: [id number] Arrival date & time: 09/30/21  1110     History  Chief Complaint  Patient presents with   Aphasia    [name] is a 88 y.o. male.  Patient with history of CVA/TIAs, questionable seizures currently on Depakote, paroxysmal atrial fibrillation, GI bleeding --presents to the emergency department today for transient expressive aphasia and confusion.  This was witnessed by a caregiver and patient's wife.  Around 10 AM he developed difficulty with his speech.  He had trouble getting out his words per their report.  He was also confused, trying to sit down on a wheelchair to use the toilet.  Around 10:30 AM he began to come back to normal.  They called nurse at the assisted living they live at who recommended ED evaluation.  Patient has been in his baseline state of health prior to this morning.  No fevers, infectious symptoms such as cough or URI symptoms.  No vomiting or diarrhea.  No urinary symptoms.      Home Medications Prior to Admission medications   Medication Sig Start Date End Date Taking? Authorizing Provider  acetaminophen (TYLENOL) 325 MG tablet Take 2 tablets (650 mg total) by mouth every 6 (six) hours as needed for mild pain, fever or headache. Patient taking differently: Take 650 mg by mouth 2 (two) times daily. 03/12/21 03/12/22  Elgergawy, Silver Huguenin, MD  albuterol (VENTOLIN HFA) 108 (90 Base) MCG/ACT inhaler Inhale 2 puffs into the lungs every 6 (six) hours as needed for wheezing or shortness of breath.    [provider]  aspirin EC 81 MG tablet Take 81 mg by mouth daily. Swallow whole.    [provider]  budesonide (PULMICORT) 0.5 MG/2ML nebulizer solution Take 2 mLs by nebulization See admin instructions. Inhale 2 ml via nebulizer daily. May repeat once if needed. 09/01/18   [provider]  carvedilol (COREG) 3.125 MG tablet Take 1 tablet (3.125 mg total) by mouth 2  (two) times daily with a meal. 03/12/21   Elgergawy, Silver Huguenin, MD  Cholecalciferol (VITAMIN D-3 PO) Take 1 tablet by mouth in the morning.    [provider]  divalproex (DEPAKOTE SPRINKLE) 125 MG capsule Take 500 mg by mouth at bedtime.    [provider]  ferrous sulfate 325 (65 FE) MG tablet Take 1 tablet (325 mg total) by mouth 2 (two) times daily. 08/04/21   Regalado, Belkys A, MD  MELATONIN PO Take 1 tablet by mouth daily.    [provider]  metFORMIN (GLUCOPHAGE) 500 MG tablet Take 500 mg by mouth daily. 08/23/19   [provider]  pantoprazole (PROTONIX) 40 MG tablet Take 1 tablet (40 mg total) by mouth daily with breakfast. 02/04/21   Esterwood, Amy S, PA-C  polyethylene glycol (MIRALAX / GLYCOLAX) 17 g packet Take 17 g by mouth daily.    [provider]  predniSONE (DELTASONE) 10 MG tablet Take 5 mg by mouth daily.  07/11/19   [provider]  psyllium (METAMUCIL) 58.6 % packet Take 1 packet by mouth daily.    [provider]  rOPINIRole (REQUIP) 4 MG tablet Take 1 tablet (4 mg total) by mouth at bedtime. 09/11/21   Frann Rider, NP  simvastatin (ZOCOR) 40 MG tablet Take 1 tablet (40 mg total) by mouth daily. Patient taking differently: Take 40 mg by mouth at bedtime. 02/26/16   Amin, Jeanella Flattery, MD  sodium chloride (OCEAN) 0.65 %  SOLN nasal spray Place 1 spray into both nostrils 2 (two) times daily as needed (dryness).    [provider]  traMADol (ULTRAM) 50 MG tablet Take 1 tablet (50 mg total) by mouth daily as needed for moderate pain. Patient taking differently: Take 50 mg by mouth at bedtime. 03/12/21   Elgergawy, Silver Huguenin, MD      Allergies    Tape    Review of Systems   Review of Systems  Physical Exam Updated Vital Signs BP (!) 162/92   Pulse 73   Temp 98.2 F (36.8 C)   Resp (!) 22   Ht '5\' 10"'$  (1.778 m)   Wt 67.1 kg   SpO2 93%   BMI 21.24 kg/m  Physical Exam Vitals and nursing note reviewed.   Constitutional:      General: He is not in acute distress.    Appearance: He is well-developed.  HENT:     Head: Normocephalic and atraumatic.     Right Ear: Tympanic membrane, ear canal and external ear normal.     Left Ear: Tympanic membrane, ear canal and external ear normal.     Nose: Nose normal.     Mouth/Throat:     Pharynx: Uvula midline.  Eyes:     General: Lids are normal. No visual field deficit.       Right eye: No discharge.        Left eye: No discharge.     Conjunctiva/sclera: Conjunctivae normal.     Pupils: Pupils are equal, round, and reactive to light.  Cardiovascular:     Rate and Rhythm: Normal rate and regular rhythm.     Heart sounds: Normal heart sounds.  Pulmonary:     Effort: Pulmonary effort is normal.     Breath sounds: Normal breath sounds.  Abdominal:     Palpations: Abdomen is soft.     Tenderness: There is no abdominal tenderness.  Musculoskeletal:        General: Normal range of motion.     Cervical back: Normal range of motion and neck supple. No tenderness or bony tenderness.  Skin:    General: Skin is warm and dry.  Neurological:     Mental Status: He is alert and oriented to person, place, and time.     GCS: GCS eye subscore is 4. GCS verbal subscore is 5. GCS motor subscore is 6.     Cranial Nerves: No cranial nerve deficit, dysarthria or facial asymmetry.     Sensory: No sensory deficit.     Motor: Pronator drift (left arm drift) present. No abnormal muscle tone.     Coordination: Coordination normal.    ED Results / Procedures / Treatments   Labs (all labs ordered are listed, but only abnormal results are displayed) Labs Reviewed  CBC - Abnormal; Notable for the following components:      Result Value   WBC 11.6 (*)    RDW 17.8 (*)    All other components within normal limits  DIFFERENTIAL - Abnormal; Notable for the following components:   Neutro Abs 8.4 (*)    Eosinophils Absolute 0.9 (*)    All other components within  normal limits  COMPREHENSIVE METABOLIC PANEL - Abnormal; Notable for the following components:   Glucose, Bld 126 (*)    Calcium 8.7 (*)    All other components within normal limits  VALPROIC ACID LEVEL - Abnormal; Notable for the following components:   Valproic Acid Lvl 37 (*)  All other components within normal limits  CBG MONITORING, ED - Abnormal; Notable for the following components:   Glucose-Capillary 181 (*)    All other components within normal limits  PROTIME-INR  APTT  URINALYSIS, ROUTINE W REFLEX MICROSCOPIC    ED ECG REPORT   Date: 09/30/2021  Rate: 71  Rhythm: normal sinus rhythm  QRS Axis: left  Intervals: normal  ST/T Wave abnormalities: normal  Conduction Disutrbances:none  Narrative Interpretation:   Old EKG Reviewed: unchanged from 06/2021  I have personally reviewed the EKG tracing and agree with the computerized printout as noted.   Radiology CT HEAD WO CONTRAST  Result Date: 09/30/2021 CLINICAL DATA:  Speech change for 30 minutes, resolved. EXAM: CT HEAD WITHOUT CONTRAST TECHNIQUE: Contiguous axial images were obtained from the base of the skull through the vertex without intravenous contrast. RADIATION DOSE REDUCTION: This exam was performed according to the departmental dose-optimization program which includes automated exposure control, adjustment of the mA and/or kV according to patient size and/or use of iterative reconstruction technique. COMPARISON:  MRI and CT/CTA head and neck 03/09/2021 FINDINGS: Brain: There is no evidence of acute intracranial hemorrhage, extra-axial fluid collection, or acute infarct Parenchymal volume is stable. The ventricles are stable in size. Small remote lacunar infarcts in the bilateral basal ganglia and left thalamus are unchanged. Additional confluent hypodensity in the supratentorial white matter likely reflecting background chronic white matter microangiopathy is unchanged There is no mass lesion.  There is no mass  effect or midline shift. Vascular: There is calcification of the bilateral cavernous ICAs. Skull: Normal. Negative for fracture or focal lesion. Sinuses/Orbits: There is layering fluid in the right sphenoid sinus, new since the prior study. Bilateral lens implants are in place. The globes and orbits are otherwise unremarkable. Other: There is a small right mastoid effusion, also new since the prior study. The imaged right nasopharynx is unremarkable. IMPRESSION: 1. Stable noncontrast head CT with no acute intracranial pathology. 2. Layering fluid in the right maxillary sinus is increased since the prior study and may reflect acute sinusitis in the correct clinical setting. 3. New small right mastoid effusion, nonspecific. The imaged right nasopharynx is unremarkable. Electronically Signed   By: Valetta Mole M.D.   On: 09/30/2021 12:16    Procedures Procedures    Medications Ordered in ED Medications - No data to display  ED Course/ Medical Decision Making/ A&P    Patient seen and examined. History obtained directly from patient.  Reviewed previous hospitalization notes over the previous year for GI bleed and stroke work-up.  Labs/EKG: Ordered stroke evaluation labs.  Imaging: Ordered CT head.  Medications/Fluids: None ordered.  Most recent vital signs reviewed and are as follows: BP (!) 169/116   Pulse 70   Temp 98.2 F (36.8 C)   Resp 19   Ht '5\' 10"'$  (1.778 m)   Wt 67.1 kg   SpO2 95%   BMI 21.24 kg/m   Initial impression: TIA, resolved   Reassessment performed. Patient appears stable.    Labs personally reviewed and interpreted including: CBC with minimally elevated blood cell count at 11.6; CMP glucose 126 otherwise unremarkable; PT/INR/APTT was normal; UA without signs of infection; Depakote slightly below therapeutic level.  Imaging personally visualized and interpreted including: CT head agree negative for acute findings, stable from previous.  Reviewed pertinent lab work  and imaging with patient at bedside. Questions answered.   Most current vital signs reviewed and are as follows: BP (!) 169/116   Pulse 70  Temp 98.2 F (36.8 C)   Resp 19   Ht '5\' 10"'$  (1.778 m)   Wt 67.1 kg   SpO2 95%   BMI 21.24 kg/m   I consulted with Dr. Quinn Axe of neurology by telephone.  She requests admission to the hospital for TIA evaluation.  I discussed this plan with patient and family at bedside.  They spent a few minutes talking over and agree with admission.  Plan: Admit to hospital.   3:08 PM Reassessment performed. Patient appears stable. Family leaving at this time.   Reviewed pertinent lab work and imaging with patient at bedside.  Patient's wife is happy that hemoglobin is improved today.  Questions answered.   Most current vital signs reviewed and are as follows: BP (!) 169/116   Pulse 70   Temp 98.2 F (36.8 C)   Resp 19   Ht '5\' 10"'$  (1.778 m)   Wt 67.1 kg   SpO2 95%   BMI 21.24 kg/m   Plan: Admit to hospital.   Consulted with [facility] hospitalist by telephone, accepts patient for admission.                           Medical Decision Making Amount and/or Complexity of Data Reviewed Labs: ordered. Radiology: ordered.  Risk Decision regarding hospitalization.   Patient with TIA today.  History of same.  Question TIA versus partial seizure versus other metabolic etiology.  Work-up here is reassuring.  No acute strokes or bleeding on noncontrast head CT.  Patient is back to baseline and has remained stable.         Final Clinical Impression(s) / ED Diagnoses Final diagnoses:  TIA (transient ischemic attack)    Rx / DC Orders ED Discharge Orders     None         Carlisle Cater, Hershal Coria 09/30/21 1548    Charlesetta Shanks, MD 10/05/21 2009

## 2021-09-30 NOTE — Treatment Plan (Signed)
88 yo with hx afib, T2DM, hx gastric AVM, hx GI bleed (discharged 07/2021), HLD and multiple other medical who presented with confusion and slurred speech starting 50 min prior to presentation.  Described as expressive aphasia lasting about 30 min.  Now back to baseline.  Requesting admission for TIA evaluation.  EDP discussed with neurology who recommended admission.  Labs notable for mildly elevated WBC count.  Head CT without acute intracranial pathology (possibly acute sinusitis).  Vitals notable for elevated BP.  Will admit for TIA workup.

## 2021-09-30 NOTE — ED Triage Notes (Addendum)
Confusion and slurred speech 50 min ago per family. Pt alert and oriented x 4 . Hx stroke 6 years ago and multiple TIAs per spouse .  Per family aphasia lasted about 30 minutes

## 2021-09-30 NOTE — ED Notes (Signed)
Warm blankets

## 2021-09-30 NOTE — ED Notes (Signed)
Spoke with wife, updated that [facility] is otw to p/u pt for transfer

## 2021-09-30 NOTE — H&P (Signed)
History and Physical    [name] OFB:[id number] DOB: Jul 17, 1931 DOA: 09/30/2021  PCP: Seward Carol, MD  Patient coming from: Home.  Chief Complaint: Difficulty speaking.  HPI: [name] is a 88 y.o. male with history of paroxysmal atrial fibrillation, CHF, GI bleed with AVMs, hyperlipidemia, prediabetes had an episode where patient was unable to speak and communicate with his wife around 9:30 AM which lasted for around 45 to 50 minutes.  Did not have any weakness of the extremities or any difficulty with seeing or talking.  ED Course: By the time patient reached ER patient was asymptomatic.  CT head is unremarkable.  On-call neurologist was contacted patient admitted for possible TIA.  EKG shows normal sinus rhythm.  Review of Systems: As per HPI, rest all negative.   Past Medical History:  Diagnosis Date   Acute CVA (cerebrovascular accident) ([place]) 07/25/2015   Asthma    Benign essential HTN    Cerebral infarction due to embolism of left middle cerebral artery ([place]) 02/03/2014   Chronic combined systolic and diastolic CHF (congestive heart failure) ([place]) 12/23/2017   Colitis    Colon polyps    Diabetes mellitus type 2, diet-controlled ([place]) 12/22/2013   Diverticulosis    GERD (gastroesophageal reflux disease)    GI bleed    GI bleed    Hemorrhoids    Hiatal hernia    History of esophageal strciture    HLD (hyperlipidemia)    Osteoarthritis    Proctitis    Status post dilation of esophageal narrowing    Stroke [facility])     Past Surgical History:  Procedure Laterality Date   ABDOMINAL HERNIA REPAIR     BIOPSY  04/05/2019   Procedure: BIOPSY;  Surgeon: Thornton Sanderford, MD;  Location: [place];  Service: Gastroenterology;;   COLONOSCOPY     multiple   COLONOSCOPY WITH PROPOFOL N/A 12/12/2016   Procedure: COLONOSCOPY WITH PROPOFOL;  Surgeon: Gatha Mayer, MD;  Location: [facility] ENDOSCOPY;  Service: Endoscopy;  Laterality: N/A;   COLONOSCOPY WITH PROPOFOL N/A  04/04/2019   Procedure: COLONOSCOPY WITH PROPOFOL;  Surgeon: Milus Banister, MD;  Location: [facility] ENDOSCOPY;  Service: Endoscopy;  Laterality: N/A;   ENTEROSCOPY N/A 01/16/2020   Procedure: ENTEROSCOPY;  Surgeon: Rush Landmark Telford Nab., MD;  Location: [facility] ENDOSCOPY;  Service: Gastroenterology;  Laterality: N/A;   ENTEROSCOPY N/A 01/20/2020   Procedure: ENTEROSCOPY;  Surgeon: Rush Landmark Telford Nab., MD;  Location: [facility] ENDOSCOPY;  Service: Gastroenterology;  Laterality: N/A;   ENTEROSCOPY N/A 09/16/2020   Procedure: ENTEROSCOPY;  Surgeon: Ladene Artist, MD;  Location: [facility] ENDOSCOPY;  Service: Endoscopy;  Laterality: N/A;  push enteroscopy   ESOPHAGOGASTRODUODENOSCOPY     multiple   ESOPHAGOGASTRODUODENOSCOPY (EGD) WITH PROPOFOL N/A 04/05/2019   Procedure: ESOPHAGOGASTRODUODENOSCOPY (EGD) WITH PROPOFOL;  Surgeon: Thornton Marchio, MD;  Location: [place];  Service: Gastroenterology;  Laterality: N/A;   ESOPHAGOGASTRODUODENOSCOPY (EGD) WITH PROPOFOL N/A 01/18/2020   Procedure: ESOPHAGOGASTRODUODENOSCOPY (EGD) WITH PROPOFOL;  Surgeon: Doran Stabler, MD;  Location: [facility] ENDOSCOPY;  Service: Gastroenterology;  Laterality: N/A;  For placement of video capsule study   GIVENS CAPSULE STUDY N/A 01/16/2020   Procedure: [place];  Surgeon: Irving Copas., MD;  Location: [facility] ENDOSCOPY;  Service: Gastroenterology;  Laterality: N/A;   GIVENS CAPSULE STUDY N/A 01/18/2020   Procedure: GIVENS CAPSULE STUDY;  Surgeon: Doran Stabler, MD;  Location: [facility] ENDOSCOPY;  Service: Gastroenterology;  Laterality: N/A;   HOT HEMOSTASIS N/A 01/20/2020   Procedure: HOT  HEMOSTASIS (ARGON PLASMA COAGULATION/BICAP);  Surgeon: Irving Copas., MD;  Location: [facility] ENDOSCOPY;  Service: Gastroenterology;  Laterality: N/A;   HOT HEMOSTASIS N/A 09/16/2020   Procedure: HOT HEMOSTASIS (ARGON PLASMA COAGULATION/BICAP);  Surgeon: Ladene Artist, MD;  Location: [facility] ENDOSCOPY;  Service: Endoscopy;  Laterality:  N/A;   INSERTION OF MESH  01/29/2012   Procedure: INSERTION OF MESH;  Surgeon: Gwenyth Ober, MD;  Location: [place];  Service: General;  Laterality: N/A;   IR ANGIOGRAM SELECTIVE EACH ADDITIONAL VESSEL  04/01/2019   IR ANGIOGRAM VISCERAL SELECTIVE  04/01/2019   IR ANGIOGRAM VISCERAL SELECTIVE  04/01/2019   IR ANGIOGRAM VISCERAL SELECTIVE  04/01/2019   IR US GUIDE VASC ACCESS RIGHT  6/54/6503   NISSEN FUNDOPLICATION     SAVORY DILATION N/A 01/16/2020   Procedure: SAVORY DILATION;  Surgeon: Irving Copas., MD;  Location: [facility] ENDOSCOPY;  Service: Gastroenterology;  Laterality: N/A;   SAVORY DILATION N/A 09/16/2020   Procedure: SAVORY DILATION;  Surgeon: Ladene Artist, MD;  Location: [facility] ENDOSCOPY;  Service: Endoscopy;  Laterality: N/A;   SCLEROTHERAPY  01/20/2020   Procedure: SCLEROTHERAPY;  Surgeon: Mansouraty, Telford Nab., MD;  Location: [facility];  Service: Gastroenterology;;   SPLENECTOMY, TOTAL     nontraumatic rupture   SUBMUCOSAL TATTOO INJECTION  01/16/2020   Procedure: SUBMUCOSAL TATTOO INJECTION;  Surgeon: Irving Copas., MD;  Location: [facility];  Service: Gastroenterology;;   VENTRAL HERNIA REPAIR  01/29/2012    WITH MESH   VENTRAL HERNIA REPAIR  01/29/2012   Procedure: HERNIA REPAIR VENTRAL ADULT;  Surgeon: Gwenyth Ober, MD;  Location: [place];  Service: General;  Laterality: N/A;  open recurrent ventral hernia repair with mesh     reports that he has never smoked. He has never used smokeless tobacco. He reports current alcohol use of about 2.0 standard drinks of alcohol per week. He reports that he does not use drugs.  Allergies  Allergen Reactions   Tape Other (See Comments)    Pt has sensitive skin, skin tears and bruises easily - please use paper tape.    Family History  Problem Relation Age of Onset   Alzheimer's disease Mother    Breast cancer Mother    Throat cancer Father    Colon cancer Neg Hx    Colon polyps Neg Hx    Pancreatic cancer Neg  Hx    Stomach cancer Neg Hx    Liver disease Neg Hx    Esophageal cancer Neg Hx    Rectal cancer Neg Hx     Prior to Admission medications   Medication Sig Start Date End Date Taking? Authorizing Provider  acetaminophen (TYLENOL) 325 MG tablet Take 2 tablets (650 mg total) by mouth every 6 (six) hours as needed for mild pain, fever or headache. Patient taking differently: Take 650 mg by mouth 2 (two) times daily. 03/12/21 03/12/22  Elgergawy, Silver Huguenin, MD  albuterol (VENTOLIN HFA) 108 (90 Base) MCG/ACT inhaler Inhale 2 puffs into the lungs every 6 (six) hours as needed for wheezing or shortness of breath.    [provider]  aspirin EC 81 MG tablet Take 81 mg by mouth daily. Swallow whole.    [provider]  budesonide (PULMICORT) 0.5 MG/2ML nebulizer solution Take 2 mLs by nebulization See admin instructions. Inhale 2 ml via nebulizer daily. May repeat once if needed. 09/01/18   [provider]  carvedilol (COREG) 3.125 MG tablet Take 1 tablet (3.125 mg total) by mouth 2 (  two) times daily with a meal. 03/12/21   Elgergawy, Silver Huguenin, MD  Cholecalciferol (VITAMIN D-3 PO) Take 1 tablet by mouth in the morning.    [provider]  divalproex (DEPAKOTE SPRINKLE) 125 MG capsule Take 500 mg by mouth at bedtime.    [provider]  ferrous sulfate 325 (65 FE) MG tablet Take 1 tablet (325 mg total) by mouth 2 (two) times daily. 08/04/21   Regalado, Belkys A, MD  MELATONIN PO Take 1 tablet by mouth daily.    [provider]  metFORMIN (GLUCOPHAGE) 500 MG tablet Take 500 mg by mouth daily. 08/23/19   [provider]  pantoprazole (PROTONIX) 40 MG tablet Take 1 tablet (40 mg total) by mouth daily with breakfast. 02/04/21   Esterwood, Amy S, PA-C  polyethylene glycol (MIRALAX / GLYCOLAX) 17 g packet Take 17 g by mouth daily.    [provider]  predniSONE (DELTASONE) 10 MG tablet Take 5 mg by mouth daily.  07/11/19   [provider]   psyllium (METAMUCIL) 58.6 % packet Take 1 packet by mouth daily.    [provider]  rOPINIRole (REQUIP) 4 MG tablet Take 1 tablet (4 mg total) by mouth at bedtime. 09/11/21   Frann Rider, NP  simvastatin (ZOCOR) 40 MG tablet Take 1 tablet (40 mg total) by mouth daily. Patient taking differently: Take 40 mg by mouth at bedtime. 02/26/16   Amin, Jeanella Flattery, MD  sodium chloride (OCEAN) 0.65 % SOLN nasal spray Place 1 spray into both nostrils 2 (two) times daily as needed (dryness).    [provider]  traMADol (ULTRAM) 50 MG tablet Take 1 tablet (50 mg total) by mouth daily as needed for moderate pain. Patient taking differently: Take 50 mg by mouth at bedtime. 03/12/21   Elgergawy, Silver Huguenin, MD    Physical Exam: Constitutional: Moderately built and nourished. Vitals:   09/30/21 1600 09/30/21 1800 09/30/21 1903 09/30/21 2001  BP: (!) 133/120 (!) 169/93  (!) 157/104  Pulse: 70   83  Resp: '20 12  18  '$ Temp:   98.2 F (36.8 C) 98 F (36.7 C)  TempSrc:   Oral Oral  SpO2: 95% 96%  95%  Weight:      Height:       Eyes: Anicteric no pallor. ENMT: No discharge from the ears eyes nose and mouth. Neck: No mass felt.  No neck rigidity. Respiratory: No rhonchi or crepitations. Cardiovascular: S1-S2 heard. Abdomen: Soft nontender bowel sound present. Musculoskeletal: No edema. Skin: No rash. Neurologic: Alert awake oriented to time place and person.  Moves all extremities 5 x 5.  No facial asymmetry tongue is midline pupils equal reacting to light. Psychiatric: Appears normal.  Normal affect.   Labs on Admission: I have personally reviewed following labs and imaging studies  CBC: Recent Labs  Lab 09/30/21 1123  WBC 11.6*  NEUTROABS 8.4*  HGB 15.1  HCT 47.9  MCV 94.9  PLT 269   Basic Metabolic Panel: Recent Labs  Lab 09/30/21 1123  NA 136  K 3.6  CL 99  CO2 27  GLUCOSE 126*  BUN 15  CREATININE 0.87  CALCIUM 8.7*   GFR: Estimated Creatinine  Clearance: 55.7 mL/min (by C-G formula based on SCr of 0.87 mg/dL). Liver Function Tests: Recent Labs  Lab 09/30/21 1123  AST 22  ALT 13  ALKPHOS 64  BILITOT 0.6  PROT 7.0  ALBUMIN 3.6   No results for input(s): "LIPASE", "AMYLASE" in the  last 168 hours. No results for input(s): "AMMONIA" in the last 168 hours. Coagulation Profile: Recent Labs  Lab 09/30/21 1123  INR 1.0   Cardiac Enzymes: No results for input(s): "CKTOTAL", "CKMB", "CKMBINDEX", "TROPONINI" in the last 168 hours. BNP (last 3 results) No results for input(s): "PROBNP" in the last 8760 hours. HbA1C: No results for input(s): "HGBA1C" in the last 72 hours. CBG: Recent Labs  Lab 09/30/21 1116  GLUCAP 181*   Lipid Profile: No results for input(s): "CHOL", "HDL", "LDLCALC", "TRIG", "CHOLHDL", "LDLDIRECT" in the last 72 hours. Thyroid Function Tests: No results for input(s): "TSH", "T4TOTAL", "FREET4", "T3FREE", "THYROIDAB" in the last 72 hours. Anemia Panel: No results for input(s): "VITAMINB12", "FOLATE", "FERRITIN", "TIBC", "IRON", "RETICCTPCT" in the last 72 hours. Urine analysis:    Component Value Date/Time   COLORURINE YELLOW 09/30/2021 1240   APPEARANCEUR CLEAR 09/30/2021 1240   LABSPEC 1.020 09/30/2021 1240   PHURINE 7.0 09/30/2021 1240   GLUCOSEU NEGATIVE 09/30/2021 1240   HGBUR NEGATIVE 09/30/2021 [place] 09/30/2021 1240   KETONESUR NEGATIVE 09/30/2021 1240   PROTEINUR NEGATIVE 09/30/2021 1240   UROBILINOGEN 0.2 12/22/2013 1210   NITRITE NEGATIVE 09/30/2021 1240   LEUKOCYTESUR NEGATIVE 09/30/2021 1240   Sepsis Labs: '@LABRCNTIP'$ (procalcitonin:4,lacticidven:4) )No results found for this or any previous visit (from the past 240 hour(s)).   Radiological Exams on Admission: CT HEAD WO CONTRAST  Result Date: 09/30/2021 CLINICAL DATA:  Speech change for 30 minutes, resolved. EXAM: CT HEAD WITHOUT CONTRAST TECHNIQUE: Contiguous axial images were obtained from the base of the  skull through the vertex without intravenous contrast. RADIATION DOSE REDUCTION: This exam was performed according to the departmental dose-optimization program which includes automated exposure control, adjustment of the mA and/or kV according to patient size and/or use of iterative reconstruction technique. COMPARISON:  MRI and CT/CTA head and neck 03/09/2021 FINDINGS: Brain: There is no evidence of acute intracranial hemorrhage, extra-axial fluid collection, or acute infarct Parenchymal volume is stable. The ventricles are stable in size. Small remote lacunar infarcts in the bilateral basal ganglia and left thalamus are unchanged. Additional confluent hypodensity in the supratentorial white matter likely reflecting background chronic white matter microangiopathy is unchanged There is no mass lesion.  There is no mass effect or midline shift. Vascular: There is calcification of the bilateral cavernous ICAs. Skull: Normal. Negative for fracture or focal lesion. Sinuses/Orbits: There is layering fluid in the right sphenoid sinus, new since the prior study. Bilateral lens implants are in place. The globes and orbits are otherwise unremarkable. Other: There is a small right mastoid effusion, also new since the prior study. The imaged right nasopharynx is unremarkable. IMPRESSION: 1. Stable noncontrast head CT with no acute intracranial pathology. 2. Layering fluid in the right maxillary sinus is increased since the prior study and may reflect acute sinusitis in the correct clinical setting. 3. New small right mastoid effusion, nonspecific. The imaged right nasopharynx is unremarkable. Electronically Signed   By: Valetta Mole M.D.   On: 09/30/2021 12:16    EKG: Independently reviewed.  Normal sinus rhythm.  Assessment/Plan Principal Problem:   TIA (transient ischemic attack) Active Problems:   Diabetes mellitus type II, non insulin dependent (HCC)   PAF (paroxysmal atrial fibrillation) ([place])   Gastric AVM     Possible TIA -appreciate neurology consult.  Patient is on neurochecks.  Patient passed stroke swallow.  Check MRI brain 2D echo MRI brain carotid Doppler.  Continue statins check hemoglobin A1c and lipid panel.  On baby  aspirin.  Note that patient does have a history of GI bleed from AVMs. Paroxysmal atrial fibrillation presently in sinus rhythm not on anticoagulation secondary to history of GI bleed. History of CHF appears euvolemic. Hypertension allow permissive hypertension as needed IV hydralazine for systolic blood pressure more than 220. History of prediabetes takes metformin at home.  We will keep patient sliding scale coverage follow hemoglobin A1c. Patient also takes Depakote which needs to be confirmed.  Home medication needs to be verified.   Since patient has symptoms concerning for possible TIA versus stroke will need close monitoring and inpatient status.   DVT prophylaxis: Heparin. Code Status: Full code. Family Communication: Discussed with patient.  Need to discuss with patient's wife to confirm home medications. Disposition Plan: Home. Consults called: Neurologist. Admission status: Observation.   Rise Patience MD [facility] Pager [phone].  If 7PM-7AM, please contact night-coverage [email or web address] Password [email or web address]  09/30/2021, 9:10 PM

## 2021-09-30 NOTE — ED Notes (Signed)
2 person assist patient to bedside commode

## 2021-09-30 NOTE — ED Notes (Signed)
Peanut butter and graham crackers  238 ml cranberry juice

## 2021-09-30 NOTE — ED Notes (Signed)
Patient taked abluterol BID at home, and has not had one today. Currently slight exp wheezes, no distress. Spoke with MD and albuterol given. RT to monitor

## 2021-10-01 ENCOUNTER — Other Ambulatory Visit (HOSPITAL_COMMUNITY): Payer: Medicare Other

## 2021-10-01 ENCOUNTER — Encounter (HOSPITAL_COMMUNITY): Payer: Medicare Other

## 2021-10-01 DIAGNOSIS — R2990 Unspecified symptoms and signs involving the nervous system: Secondary | ICD-10-CM | POA: Diagnosis not present

## 2021-10-01 DIAGNOSIS — I480 Paroxysmal atrial fibrillation: Secondary | ICD-10-CM | POA: Diagnosis not present

## 2021-10-01 DIAGNOSIS — K31819 Angiodysplasia of stomach and duodenum without bleeding: Secondary | ICD-10-CM

## 2021-10-01 DIAGNOSIS — G40919 Epilepsy, unspecified, intractable, without status epilepticus: Secondary | ICD-10-CM | POA: Diagnosis not present

## 2021-10-01 DIAGNOSIS — G459 Transient cerebral ischemic attack, unspecified: Secondary | ICD-10-CM | POA: Diagnosis not present

## 2021-10-01 DIAGNOSIS — E119 Type 2 diabetes mellitus without complications: Secondary | ICD-10-CM | POA: Diagnosis not present

## 2021-10-01 LAB — GLUCOSE, CAPILLARY: Glucose-Capillary: 113 mg/dL — ABNORMAL HIGH (ref 70–99)

## 2021-10-01 MED ORDER — DIVALPROEX SODIUM 125 MG PO CSDR
250.0000 mg | DELAYED_RELEASE_CAPSULE | Freq: Every morning | ORAL | Status: DC
Start: 2021-10-01 — End: 2021-10-01
  Administered 2021-10-01: 250 mg via ORAL
  Filled 2021-10-01: qty 2

## 2021-10-01 MED ORDER — DIVALPROEX SODIUM 125 MG PO CSDR: 250.0000 mg | DELAYED_RELEASE_CAPSULE | ORAL | 0 refills | Status: DC

## 2021-10-01 MED ORDER — DIVALPROEX SODIUM 125 MG PO CSDR
750.0000 mg | DELAYED_RELEASE_CAPSULE | Freq: Every day | ORAL | Status: DC
  Administered 2021-10-01: 750 mg via ORAL
  Filled 2021-10-01: qty 6

## 2021-10-01 MED ORDER — DIVALPROEX SODIUM 125 MG PO CSDR: 500.0000 mg | DELAYED_RELEASE_CAPSULE | Freq: Every day | ORAL | Status: DC

## 2021-10-01 NOTE — Evaluation (Signed)
Occupational Therapy Evaluation Patient Details Name: [name] MRN: [id number] DOB: 1931-02-25 Today's Date: 10/01/2021   History of Present Illness [name] is a 88 y.o. male who presented with expressive aphasia. Symptoms resolved, found to have TIA. PMH: paroxysmal atrial fibrillation, CHF, GI bleed with AVMs, hyperlipidemia, prediabetes   Clinical Impression   Pt needing assist from caregiver/spouse at baseline for ADLs, uses RW for short distance mobility, but primarily power w/c. Pt needing min-mod A for ADLs and min guard for BSC transfer with RW. Pt with LUE deficits from prior CVA, however able to use functionally. Pt with some slow processing during session to recall information, however is possibly baseline and is [facility] for tasks assessed. Pt presenting with impairments listed below, will follow acutely. Recommend HHOT at d/c.      Recommendations for follow up therapy are one component of a multi-disciplinary discharge planning process, led by the attending physician.  Recommendations may be updated based on patient status, additional functional criteria and insurance authorization.   Follow Up Recommendations  Home health OT    Assistance Recommended at Discharge Intermittent Supervision/Assistance  Patient can return home with the following A little help with walking and/or transfers;A lot of help with bathing/dressing/bathroom;Direct supervision/assist for medications management;Direct supervision/assist for financial management;Assist for transportation;Help with stairs or ramp for entrance;Assistance with cooking/housework    Functional Status Assessment  Patient has had a recent decline in their functional status and demonstrates the ability to make significant improvements in function in a reasonable and predictable amount of time.  Equipment Recommendations  None recommended by OT (pt has all needed DME)    Recommendations for Other Services PT consult      Precautions / Restrictions Precautions Precautions: Fall Precaution Comments: h/o falls Required Braces or Orthoses: Other Brace Other Brace: L AFO Restrictions Weight Bearing Restrictions: No      Mobility Bed Mobility               General bed mobility comments: pt OOB in chair upon arrival    Transfers Overall transfer level: Needs assistance Equipment used: Rolling walker (2 wheels) Transfers: Sit to/from Stand Sit to Stand: Min guard                  Balance Overall balance assessment: Needs assistance Sitting-balance support: Feet supported, No upper extremity supported Sitting balance-Leahy Scale: Fair     Standing balance support: Bilateral upper extremity supported, During functional activity, Reliant on assistive device for balance Standing balance-Leahy Scale: Poor Standing balance comment: requires RW for safe standing/amb                           ADL either performed or assessed with clinical judgement   ADL Overall ADL's : Needs assistance/impaired Eating/Feeding: Supervision/ safety   Grooming: Supervision/safety   Upper Body Bathing: Minimal assistance   Lower Body Bathing: Moderate assistance   Upper Body Dressing : Minimal assistance   Lower Body Dressing: Moderate assistance   Toilet Transfer: Minimal assistance;BSC/3in1;Stand-pivot;Rolling walker (2 wheels)   Toileting- Clothing Manipulation and Hygiene: Minimal assistance Toileting - Clothing Manipulation Details (indicate cue type and reason): for pericare     Functional mobility during ADLs: Minimal assistance       Vision Baseline Vision/History: 1 Wears glasses Vision Assessment?: No apparent visual deficits     Perception     Praxis      Pertinent Vitals/Pain  Hand Dominance Right   Extremity/Trunk Assessment Upper Extremity Assessment Upper Extremity Assessment: Defer to OT evaluation LUE Deficits / Details: ~50% shoulder AROM,  weakness and decreased coordination from prior CVA LUE Coordination: decreased fine motor;decreased gross motor   Lower Extremity Assessment Lower Extremity Assessment: Generalized weakness   Cervical / Trunk Assessment Cervical / Trunk Assessment: Kyphotic (forward head)   Communication Communication Communication: No difficulties (expressive aphasia has resolved)   Cognition Arousal/Alertness: Awake/alert Behavior During Therapy: WFL for tasks assessed/performed                                   General Comments: pt wtih some delayed processing, possible baseline, however overall WFL     General Comments  VSS on RA, neurology in room at time of evaluation    Exercises     Shoulder Instructions      Home Living Family/patient expects to be discharged to:: Other (Comment) ([facility])                                 Additional Comments: Pt and wife live in [place]      Prior Functioning/Environment Prior Level of Function : Needs assist             Mobility Comments: uses RW for very short distances but predominately uses power w/c ADLs Comments: wife sets out clothes pt can dress himself, caregiver assist with transfer into shower but he can bath himself        OT Problem List: Decreased strength;Decreased range of motion;Decreased activity tolerance;Impaired balance (sitting and/or standing);Impaired UE functional use;Cardiopulmonary status limiting activity      OT Treatment/Interventions: Self-care/ADL training;Therapeutic exercise;DME and/or AE instruction;Therapeutic activities;Patient/family education;Visual/perceptual remediation/compensation;Balance training;Cognitive remediation/compensation;Energy conservation    OT Goals(Current goals can be found in the care plan section) Acute Rehab OT Goals Patient Stated Goal: none stated OT Goal Formulation: With patient Time For Goal Achievement: 10/15/21 Potential  to Achieve Goals: Good ADL Goals Pt Will Perform Upper Body Dressing: with supervision;sitting;standing Pt Will Perform Lower Body Dressing: with supervision;sit to/from stand;sitting/lateral leans Pt Will Transfer to Toilet: with supervision;regular height toilet;ambulating Additional ADL Goal #1: pt will perform 3 step trailmaking task in prep for ADLs Additional ADL Goal #2: pt will perform bed mobility mod I in prep for ADLs  OT Frequency: Min 2X/week    Co-evaluation              AM-PAC OT "6 Clicks" Daily Activity     Outcome Measure Help from another person eating meals?: None Help from another person taking care of personal grooming?: A Little Help from another person toileting, which includes using toliet, bedpan, or urinal?: A Little Help from another person bathing (including washing, rinsing, drying)?: A Lot Help from another person to put on and taking off regular upper body clothing?: A Little Help from another person to put on and taking off regular lower body clothing?: A Lot 6 Click Score: 17   End of Session Equipment Utilized During Treatment: Gait belt;Rolling walker (2 wheels) Nurse Communication: Mobility status  Activity Tolerance: Patient tolerated treatment well Patient left: in chair;with call bell/phone within reach;with chair alarm set  OT Visit Diagnosis: Unsteadiness on feet (R26.81);Other abnormalities of gait and mobility (R26.89);Muscle weakness (generalized) (M62.81)  Time: 5003-7048 OT Time Calculation (min): 29 min Charges:  OT General Charges $OT Visit: 1 Visit OT Evaluation $OT Eval Low Complexity: 1 Low OT Treatments $Self Care/Home Management : 8-22 mins  Lynnda Child, OTD, OTR/L Acute Rehab [phone] - [place] 10/01/2021, 11:00 AM

## 2021-10-01 NOTE — Consult Note (Signed)
Neurology Consultation Reason for Consult: Transient Aphasia Requesting Physician: Vance Gather   CC:   History is obtained from: Wife and chart review   HPI: [name] is a 88 y.o. male with a PMHx of CVA with residual left-sided weakness, HTN, hyperlipidemia, type 2 diabetes, esophageal narrowing, GI bleed, hemorrhoids, and recurrent episodes of transient aphasia concerning for seizures  On 8/15 in the morning he had an episode lasting 30 to 40 minutes where his speech was essentially incomprehensible, but by the time he arrived at the ED his speech had largely normalized.  Wife reports that this was a gradual recovery, and essentially identical to prior events except longer than more recent events.  Review of neurology notes indicate that on 09/11/2021 when he had a 49-monthfollow-up from his prior visit he had had a few 1 to 2-minute episodes.  He has additionally been tried on zonisamide but discontinued this due to increased fatigue.  He is tolerating the Depakote well without any noticeable side effects  ROS: Pertinent review of systems negative  Past Medical History:  Diagnosis Date   Acute CVA (cerebrovascular accident) ([facility] 07/25/2015   Asthma    Benign essential HTN    Cerebral infarction due to embolism of left middle cerebral artery ([place] 02/03/2014   Chronic combined systolic and diastolic CHF (congestive heart failure) ([facility] 12/23/2017   Colitis    Colon polyps    Diabetes mellitus type 2, diet-controlled ([facility] 12/22/2013   Diverticulosis    GERD (gastroesophageal reflux disease)    GI bleed    GI bleed    Hemorrhoids    Hiatal hernia    History of esophageal strciture    HLD (hyperlipidemia)    Osteoarthritis    Proctitis    Status post dilation of esophageal narrowing    Stroke ([facility]    Past Surgical History:  Procedure Laterality Date   ABDOMINAL HERNIA REPAIR     BIOPSY  04/05/2019   Procedure: BIOPSY;  Surgeon: BThornton Gautney MD;  [facility]  Service: Gastroenterology;;   COLONOSCOPY     multiple   COLONOSCOPY WITH PROPOFOL N/A 12/12/2016   Procedure: COLONOSCOPY WITH PROPOFOL;  Surgeon: GGatha Mayer MD;  [facility]  Service: Endoscopy;  Laterality: N/A;   COLONOSCOPY WITH PROPOFOL N/A 04/04/2019   Procedure: COLONOSCOPY WITH PROPOFOL;  Surgeon: JMilus Banister MD;  [facility];  Service: Endoscopy;  Laterality: N/A;   ENTEROSCOPY N/A 01/16/2020   Procedure: ENTEROSCOPY;  Surgeon: MRush LandmarkGTelford Nab, MD;  [facility];  Service: Gastroenterology;  Laterality: N/A;   ENTEROSCOPY N/A 01/20/2020   Procedure: ENTEROSCOPY;  Surgeon: MRush LandmarkGTelford Nab, MD;  [facility];  Service: Gastroenterology;  Laterality: N/A;   ENTEROSCOPY N/A 09/16/2020   Procedure: ENTEROSCOPY;  Surgeon: SLadene Artist MD;  [facility];  Service: Endoscopy;  Laterality: N/A;  push enteroscopy   ESOPHAGOGASTRODUODENOSCOPY     multiple   ESOPHAGOGASTRODUODENOSCOPY (EGD) WITH PROPOFOL N/A 04/05/2019   Procedure: ESOPHAGOGASTRODUODENOSCOPY (EGD) WITH PROPOFOL;  Surgeon: BThornton Laredo MD;  [place] [facility]  Service: Gastroenterology;  Laterality: N/A;   ESOPHAGOGASTRODUODENOSCOPY (EGD) WITH PROPOFOL N/A 01/18/2020   Procedure: ESOPHAGOGASTRODUODENOSCOPY (EGD) WITH PROPOFOL;  Surgeon: DDoran Stabler MD;  [facility];  Service: Gastroenterology;  Laterality: N/A;  For placement of video capsule study   GIVENS CAPSULE STUDY N/A 01/16/2020   [place]cedure[place]  Surgeon: MIrving Copas, MD;  [facility];  Service: Gastroenterology;  Laterality: N/A;   GIVENS CAPSULE STUDY N/A 01/18/2020   Procedure: GIVENS CAPSULE STUDY;  Surgeon: Doran Stabler, MD;  [facility];  Service: Gastroenterology;  Laterality: N/A;   HOT HEMOSTASIS N/A 01/20/2020   Procedure: HOT HEMOSTASIS (ARGON PLASMA COAGULATION/BICAP);  Surgeon:  Irving Copas., MD;  [facility];  Service: Gastroenterology;  Laterality: N/A;   HOT HEMOSTASIS N/A 09/16/2020   Procedure: HOT HEMOSTASIS (ARGON PLASMA COAGULATION/BICAP);  Surgeon: Ladene Artist, MD;  [facility];  Service: Endoscopy;  Laterality: N/A;   INSERTION OF MESH  01/29/2012   Procedure: INSERTION OF MESH;  Surgeon: Gwenyth Ober, MD;  Location: [place];  Service: General;  Laterality: N/A;   IR ANGIOGRAM SELECTIVE EACH ADDITIONAL VESSEL  04/01/2019   IR ANGIOGRAM VISCERAL SELECTIVE  04/01/2019   IR ANGIOGRAM VISCERAL SELECTIVE  04/01/2019   IR ANGIOGRAM VISCERAL SELECTIVE  04/01/2019   IR US GUIDE VASC ACCESS RIGHT  3/87/5643   NISSEN FUNDOPLICATION     SAVORY DILATION N/A 01/16/2020   Procedure: SAVORY DILATION;  Surgeon: Irving Copas., MD;  [facility];  Service: Gastroenterology;  Laterality: N/A;   SAVORY DILATION N/A 09/16/2020   Procedure: SAVORY DILATION;  Surgeon: Ladene Artist, MD;  [facility];  Service: Endoscopy;  Laterality: N/A;   SCLEROTHERAPY  01/20/2020   Procedure: SCLEROTHERAPY;  Surgeon: Mansouraty, Telford Nab., MD;  [facility];  Service: Gastroenterology;;   SPLENECTOMY, TOTAL     nontraumatic rupture   SUBMUCOSAL TATTOO INJECTION  01/16/2020   Procedure: SUBMUCOSAL TATTOO INJECTION;  Surgeon: Irving Copas., MD;  [facility];  Service: Gastroenterology;;   VENTRAL HERNIA REPAIR  01/29/2012    WITH MESH   VENTRAL HERNIA REPAIR  01/29/2012   Procedure: HERNIA REPAIR VENTRAL ADULT;  Surgeon: Gwenyth Ober, MD;  Location: [place];  Service: General;  Laterality: N/A;  open recurrent ventral hernia repair with mesh     Family History  Problem Relation Age of Onset   Alzheimer's disease Mother    Breast cancer Mother    Throat cancer Father    Colon cancer Neg Hx    Colon polyps Neg Hx    Pancreatic cancer Neg Hx    Stomach cancer Neg Hx    Liver disease Neg Hx     Esophageal cancer Neg Hx    Rectal cancer Neg Hx    Social History:  reports that he has never smoked. He has never used smokeless tobacco. He reports current alcohol use of about 2.0 standard drinks of alcohol per week. He reports that he does not use drugs.   Exam: Current vital signs: BP (!) 157/104 (BP Location: Left Arm)   Pulse 83   Temp 98 F (36.7 C) (Oral)   Resp 18   Ht '5\' 10"'$  (1.778 m)   Wt 67.1 kg   SpO2 95%   BMI 21.24 kg/m  Vital signs in last 24 hours: Temp:  [98 F (36.7 C)-98.2 F (36.8 C)] 98 F (36.7 C) (08/15 2001) Pulse Rate:  [63-83] 83 (08/15 2001) Resp:  [12-24] 18 (08/15 2001) BP: (133-190)/(76-120) 157/104 (08/15 2001) SpO2:  [92 %-99 %] 95 % (08/15 2001) Weight:  [67.1 kg] 67.1 kg (08/15 1119)   Physical Exam  Constitutional: Appears well-developed and well-nourished. Thin but energetic Psych: Affect appropriate to situation, calm and cooperative Eyes: No scleral injection HENT: No oropharyngeal obstruction.  MSK: no joint deformities.  Cardiovascular: Perfusing extremities well Respiratory:  Effort normal, non-labored breathing GI: Soft.  No distension. There is no tenderness.  Skin: Warm dry and intact visible skin  Neuro: Mental Status: Patient is awake, alert, oriented to person, place, month, year, and situation. Patient is able to give a clear and coherent history. No signs of aphasia or neglect Cranial Nerves: II: Visual Fields are full. III,IV, VI: EOMI without ptosis or diploplia.  V: Facial sensation is symmetric to light touch  VII: Facial movement is symmetric.  VIII: hearing is intact to voice (hard of hearing at baseline) X: Uvula elevates symmetrically XI: Shoulder shrug is reduced on the left  XII: tongue is midline without atrophy or fasciculations.  Motor: Tone is normal. Bulk is normal.  5/5 throughout the right side, 4/5 on the left in an upper motor neuron pattern.  Notably no asterixis.  He does have pronator  drift of the left upper extremity Sensory: Reports intact to light touch in all 4 extremities Deep Tendon Reflexes: Hypoactive on the right and 3+ on the left (brachioradialis and patella) Cerebellar: FNF and HKS are intact bilaterally Gait:  Deferred  NIHSS total 0 for new findings   I have reviewed labs in epic and the results pertinent to this consultation are:  Basic Metabolic Panel: Recent Labs  Lab 09/30/21 1123 09/30/21 2201  NA 136  --   K 3.6  --   CL 99  --   CO2 27  --   GLUCOSE 126*  --   BUN 15  --   CREATININE 0.87 0.68  CALCIUM 8.7*  --     CBC: Recent Labs  Lab 09/30/21 1123 09/30/21 2201  WBC 11.6* 13.0*  NEUTROABS 8.4*  --   HGB 15.1 15.2  HCT 47.9 46.3  MCV 94.9 91.7  PLT 340 337    Coagulation Studies: Recent Labs    09/30/21 1123  LABPROT 12.6  INR 1.0    A1c is improved from 6.3 to 5.3% LDL 57 Valproic acid level at 1211 was 37 (prior levels 35, 38)   I have reviewed the images obtained: MRI brain personally reviewed,  1. No acute intracranial process. No evidence of acute or subacute infarct. 2.  No intracranial large vessel occlusion or significant stenosis. There is significant chronic microvascular disease  Impression: I feel that this event was a breakthrough seizure, and he has room to increase the Depakote given he is not having any side effects and the level is subtherapeutic  Recommendations: -Increase Depakote to 250 in the morning and 500 mg in the evening -Close outpatient follow-up with neurology, no further inpatient work-up   Lesleigh Noe MD-PhD [facility] [phone] Available 7 PM to 7 AM, outside of these hours please call Neurologist on call as listed on Amion.

## 2021-10-01 NOTE — Care Management CC44 (Signed)
Condition Code 44 Documentation Completed  Patient Details  Name: [name] MRN: [id number] Date of Birth: 1931-03-15   Condition Code 44 given:  Yes Patient signature on Condition Code 44 notice:  Yes Documentation of 2 MD's agreement:  Yes Code 44 added to claim:  Yes    Pollie Friar, RN 10/01/2021, 12:07 PM

## 2021-10-01 NOTE — Progress Notes (Signed)
Discharge paperwork has been printed and reviewed with patient and family.  Peripheral IV has been removed.

## 2021-10-01 NOTE — Discharge Summary (Signed)
Physician Discharge Summary   Patient: [name] MRN: [id number] DOB: [birth year]  Admit date:     09/30/2021  Discharge date: 10/01/2021  Discharge Physician: Patrecia Pour   PCP: Seward Carol, MD   Recommendations at discharge:  Follow up in 1-2 weeks with repeat depakote level on higher dose. Note, advised to stop tramadol to avoid lowering seizure threshold. Follow up with neurology after discharge, established at [facility].   Discharge Diagnoses: Principal Problem:   Breakthrough seizure ([place]) Active Problems:   Diabetes mellitus type II, non insulin dependent (H[facility])   PAF (paroxysmal atrial fibrillation) ([place])   Gastric AVM   Stroke-like symptoms  Hospital Course: HEINZ [name] is an 88 y.o. male with a PMHx of CVA with residual left-sided weakness, HTN, hyperlipidemia, type 2 diabetes, esophageal narrowing, GI bleed, hemorrhoids, and recurrent episodes of transient aphasia concerning for seizures   On 8/15 in the morning he had an episode lasting 30 to 40 minutes where his speech was essentially incomprehensible, but by the time he arrived at the ED his speech had largely normalized.  Wife reports that this was a gradual recovery, and essentially identical to prior events except longer than more recent events.   Review of neurology notes indicate that on 09/11/2021 when he had a 59-monthfollow-up from his prior visit he had had a few 1 to 2-minute episodes.   He has additionally been tried on zonisamide but discontinued this due to increased fatigue.  He is tolerating the Depakote well without any noticeable side effects  He was admitted to the hospital with neurology consulting. MRI brain revealed no acute process. For concern of breakthrough seizure, and subtherapeutic level, depakote dose was increased. The patient had no further spells and was discharged in stable condition 8/16.   Assessment and Plan: Suspected breakthrough seizure: TIA and stroke are felt to be ruled out based  on history and negative neuroimaging.  Paroxysmal atrial fibrillation presently in sinus rhythm not on anticoagulation secondary to history of GI bleed. History of HFpEF appears euvolemic. Hypertension  no changes to regimen History of prediabetes: HbA1c improved 6.3% > 5.3% Chronic pain: Follow up with PCP. Tramadol stopped to avoid lower seizure threshold.    Consultants: Neurology Procedures performed: None  Disposition: Home Diet recommendation:  Cardiac and Carb modified diet DISCHARGE MEDICATION: Allergies as of 10/01/2021       Reactions   Tape Other (See Comments)   Pt has sensitive skin, skin tears and bruises easily - please use paper tape.        Medication List     STOP taking these medications    traMADol 50 MG tablet Commonly known as: ULTRAM       TAKE these medications    acetaminophen 325 MG tablet Commonly known as: Tylenol Take 2 tablets (650 mg total) by mouth every 6 (six) hours as needed for mild pain, fever or headache. What changed: when to take this   albuterol 108 (90 Base) MCG/ACT inhaler Commonly known as: VENTOLIN HFA Inhale 2 puffs into the lungs every 6 (six) hours as needed for wheezing or shortness of breath.   aspirin EC 81 MG tablet Take 81 mg by mouth daily. Swallow whole.   budesonide 0.5 MG/2ML nebulizer solution Commonly known as: PULMICORT Take 2 mLs by nebulization See admin instructions. Inhale 2 ml via nebulizer daily. May repeat once if needed.   carvedilol 3.125 MG tablet Commonly known as: COREG Take 1 tablet (3.125 mg total) by  mouth 2 (two) times daily with a meal.   divalproex 125 MG capsule Commonly known as: DEPAKOTE SPRINKLE Take 2-4 capsules (250-500 mg total) by mouth See admin instructions. Taking 2 capsules ( 250 mg) in the AM and 4 capsules ( 500 mg) in the evening   ferrous sulfate 325 (65 FE) MG tablet Take 1 tablet (325 mg total) by mouth 2 (two) times daily.   MELATONIN PO Take 1 tablet by mouth  daily.   metFORMIN 500 MG tablet Commonly known as: GLUCOPHAGE Take 500 mg by mouth daily.   pantoprazole 40 MG tablet Commonly known as: PROTONIX Take 1 tablet (40 mg total) by mouth daily with breakfast.   polyethylene glycol 17 g packet Commonly known as: MIRALAX / GLYCOLAX Take 17 g by mouth daily.   predniSONE 10 MG tablet Commonly known as: DELTASONE Take 5 mg by mouth daily.   psyllium 58.6 % packet Commonly known as: METAMUCIL Take 1 packet by mouth daily.   rOPINIRole 4 MG tablet Commonly known as: REQUIP Take 1 tablet (4 mg total) by mouth at bedtime.   simvastatin 40 MG tablet Commonly known as: ZOCOR Take 1 tablet (40 mg total) by mouth daily. What changed: when to take this   sodium chloride 0.65 % Soln nasal spray Commonly known as: OCEAN Place 1 spray into both nostrils 2 (two) times daily as needed (dryness).   VITAMIN D-3 PO Take 1 tablet by mouth in the morning.        Follow-up Information     [facility] [place] Follow up.   Why: [facility] should contact you for the first therapy appointment        Seward Carol, MD Follow up.   Specialty: Internal Medicine Contact information: [address] [place] [address] [phone]         Garvin Fila, MD Follow up.   Specialties: Neurology, Radiology Contact information: [address] [place] [address] [phone]                Discharge Exam: Danley Danker Weights   09/30/21 1119  Weight: 67.1 kg  No distress, elderly male RRR, no MRG or significant edema Clear, nonlabored Soft, NT, ND No tremor no asterixis  Condition at discharge: good  The results of significant diagnostics from this hospitalization (including imaging, microbiology, ancillary and laboratory) are listed below for reference.   Imaging Studies: MR BRAIN WO CONTRAST  Result Date: 10/01/2021 CLINICAL DATA:  Transient aphasia and confusion, stroke  suspected EXAM: MRI HEAD WITHOUT CONTRAST MRA HEAD WITHOUT CONTRAST TECHNIQUE: Multiplanar, multi-echo pulse sequences of the brain and surrounding structures were acquired without intravenous contrast. Angiographic images of the Circle of Willis were acquired using MRA technique without intravenous contrast. COMPARISON:  03/09/2021 MRI head, 02/22/2016 MRA head, correlation is also made with 09/30/2021 CT head and 03/09/2021 CTA head and FINDINGS: MRI HEAD FINDINGS Brain: No restricted diffusion to suggest acute or subacute infarct. No acute hemorrhage, mass, mass effect, or midline shift. No extra-axial collection. Redemonstrated moderate to severe ventriculomegaly, likely sequela advanced cerebral volume loss. Confluent T2 hyperintense signal in the periventricular white matter, likely the sequela of moderate chronic small vessel ischemic disease. Remote right basal ganglia and corona radiata lacunar infarct. Dilated perivascular spaces in the bilateral basal ganglia. Vascular: Normal arterial flow voids. Skull and upper cervical spine: Normal marrow signal. Sinuses/Orbits: Mucosal thickening in the right maxillary sinus. Air-fluid levels in the sphenoid sinuses. Status post bilateral  lens replacements. Other: Fluid throughout the right mastoid. Trace fluid in the left mastoid. MRA HEAD FINDINGS Anterior circulation: Both internal carotid arteries are patent to the termini, without significant stenosis. A1 segments patent. Normal anterior communicating artery. Anterior cerebral arteries are patent to their distal aspects. No M1 stenosis or occlusion. Normal MCA bifurcations. Distal MCA branches perfused and symmetric. Posterior circulation: Vertebral arteries patent to the vertebrobasilar junction without stenosis. Basilar patent to its distal aspect. Superior cerebellar arteries patent bilaterally. Patent P1 segments. PCAs perfused to their distal aspects without stenosis. The bilateral posterior communicating  arteries are not visualized. Anatomic variants: None significant IMPRESSION: 1. No acute intracranial process. No evidence of acute or subacute infarct. 2.  No intracranial large vessel occlusion or significant stenosis. Electronically Signed   By: Merilyn Baba M.D.   On: 10/01/2021 02:16   MR ANGIO HEAD WO CONTRAST  Result Date: 10/01/2021 CLINICAL DATA:  Transient aphasia and confusion, stroke suspected EXAM: MRI HEAD WITHOUT CONTRAST MRA HEAD WITHOUT CONTRAST TECHNIQUE: Multiplanar, multi-echo pulse sequences of the brain and surrounding structures were acquired without intravenous contrast. Angiographic images of the Circle of Willis were acquired using MRA technique without intravenous contrast. COMPARISON:  03/09/2021 MRI head, 02/22/2016 MRA head, correlation is also made with 09/30/2021 CT head and 03/09/2021 CTA head and FINDINGS: MRI HEAD FINDINGS Brain: No restricted diffusion to suggest acute or subacute infarct. No acute hemorrhage, mass, mass effect, or midline shift. No extra-axial collection. Redemonstrated moderate to severe ventriculomegaly, likely sequela advanced cerebral volume loss. Confluent T2 hyperintense signal in the periventricular white matter, likely the sequela of moderate chronic small vessel ischemic disease. Remote right basal ganglia and corona radiata lacunar infarct. Dilated perivascular spaces in the bilateral basal ganglia. Vascular: Normal arterial flow voids. Skull and upper cervical spine: Normal marrow signal. Sinuses/Orbits: Mucosal thickening in the right maxillary sinus. Air-fluid levels in the sphenoid sinuses. Status post bilateral lens replacements. Other: Fluid throughout the right mastoid. Trace fluid in the left mastoid. MRA HEAD FINDINGS Anterior circulation: Both internal carotid arteries are patent to the termini, without significant stenosis. A1 segments patent. Normal anterior communicating artery. Anterior cerebral arteries are patent to their distal  aspects. No M1 stenosis or occlusion. Normal MCA bifurcations. Distal MCA branches perfused and symmetric. Posterior circulation: Vertebral arteries patent to the vertebrobasilar junction without stenosis. Basilar patent to its distal aspect. Superior cerebellar arteries patent bilaterally. Patent P1 segments. PCAs perfused to their distal aspects without stenosis. The bilateral posterior communicating arteries are not visualized. Anatomic variants: None significant IMPRESSION: 1. No acute intracranial process. No evidence of acute or subacute infarct. 2.  No intracranial large vessel occlusion or significant stenosis. Electronically Signed   By: Merilyn Baba M.D.   On: 10/01/2021 02:16   CT HEAD WO CONTRAST  Result Date: 09/30/2021 CLINICAL DATA:  Speech change for 30 minutes, resolved. EXAM: CT HEAD WITHOUT CONTRAST TECHNIQUE: Contiguous axial images were obtained from the base of the skull through the vertex without intravenous contrast. RADIATION DOSE REDUCTION: This exam was performed according to the departmental dose-optimization program which includes automated exposure control, adjustment of the mA and/or kV according to patient size and/or use of iterative reconstruction technique. COMPARISON:  MRI and CT/CTA head and neck 03/09/2021 FINDINGS: Brain: There is no evidence of acute intracranial hemorrhage, extra-axial fluid collection, or acute infarct Parenchymal volume is stable. The ventricles are stable in size. Small remote lacunar infarcts in the bilateral basal ganglia and left thalamus are unchanged. Additional confluent hypodensity  in the supratentorial white matter likely reflecting background chronic white matter microangiopathy is unchanged There is no mass lesion.  There is no mass effect or midline shift. Vascular: There is calcification of the bilateral cavernous ICAs. Skull: Normal. Negative for fracture or focal lesion. Sinuses/Orbits: There is layering fluid in the right sphenoid  sinus, new since the prior study. Bilateral lens implants are in place. The globes and orbits are otherwise unremarkable. Other: There is a small right mastoid effusion, also new since the prior study. The imaged right nasopharynx is unremarkable. IMPRESSION: 1. Stable noncontrast head CT with no acute intracranial pathology. 2. Layering fluid in the right maxillary sinus is increased since the prior study and may reflect acute sinusitis in the correct clinical setting. 3. New small right mastoid effusion, nonspecific. The imaged right nasopharynx is unremarkable. Electronically Signed   By: Valetta Mole M.D.   On: 09/30/2021 12:16    Microbiology: Results for orders placed or performed during the hospital encounter of 06/22/21  MRSA Next Gen by PCR, Nasal     Status: None   Collection Time: 06/23/21  6:30 PM   Specimen: Nasal Mucosa; Nasal Swab  Result Value Ref Range Status   MRSA by PCR Next Gen NOT DETECTED NOT DETECTED Final    Comment: (NOTE) The GeneXpert MRSA Assay (FDA approved for NASAL specimens only), is one component of a comprehensive MRSA colonization surveillance program. It is not intended to diagnose MRSA infection nor to guide or monitor treatment for MRSA infections. Test performance is not FDA approved in patients less than 57 years old. Performed at [facility], [place] [address] [place], [place] [address]     Labs: CBC: Recent Labs  Lab 09/30/21 1123 09/30/21 2201  WBC 11.6* 13.0*  NEUTROABS 8.4*  --   HGB 15.1 15.2  HCT 47.9 46.3  MCV 94.9 91.7  PLT 340 951   Basic Metabolic Panel: Recent Labs  Lab 09/30/21 1123 09/30/21 2201  NA 136  --   K 3.6  --   CL 99  --   CO2 27  --   GLUCOSE 126*  --   BUN 15  --   CREATININE 0.87 0.68  CALCIUM 8.7*  --    Liver Function Tests: Recent Labs  Lab 09/30/21 1123  AST 22  ALT 13  ALKPHOS 64  BILITOT 0.6  PROT 7.0  ALBUMIN 3.6   CBG: Recent Labs  Lab 09/30/21 1116 09/30/21 2141  10/01/21 0626  GLUCAP 181* 110* 113*    Discharge time spent: greater than 30 minutes.  Signed: Patrecia Pour, MD [facility] 10/04/2021

## 2021-10-01 NOTE — Care Management Obs Status (Signed)
[place] NOTIFICATION   Patient Details  Name: [name] MRN: [id number] Date of Birth: 1931/07/25   Medicare Observation Status Notification Given:  Yes    Pollie Friar, RN 10/01/2021, 12:07 PM

## 2021-10-01 NOTE — Progress Notes (Signed)
SLP Cancellation Note  Patient Details Name: [name] MRN: [id number] DOB: Sep 07, 1931   Cancelled treatment:       Reason Eval/Treat Not Completed: SLP screened, no needs identified, will sign off; pt at baseline functioning per family report; questions answered re: dysphagic symptoms and potential for ST at d/c at [facility] prn.  MRI negative for acute changes.   Elvina Sidle, M.S., CCC-SLP 10/01/2021, 11:38 AM

## 2021-10-01 NOTE — Evaluation (Signed)
Physical Therapy Evaluation Patient Details Name: [name] MRN: [id number] DOB: 11-13-1931 Today's Date: 10/01/2021  History of Present Illness  [name] is a 88 y.o. male who presented with expressive aphasia. Symptoms resolved, found to have TIA. PMH: paroxysmal atrial fibrillation, CHF, GI bleed with AVMs, hyperlipidemia, prediabetes   Clinical Impression  Pt very pleasant and cooperative. Symptoms pt was admitted for have resolved and pt is near baseline. Pt primarily uses power w/c for mobility but reports waking short distances daily so "I don't loose it." Pt lives with wife and reports he has caregivers at [facility] as well. Pt safe to d/c home with spouse once medically stable and resume pt's current therapy program. Acute PT to cont to follow to prevent further deconditioning.        Recommendations for follow up therapy are one component of a multi-disciplinary discharge planning process, led by the attending physician.  Recommendations may be updated based on patient status, additional functional criteria and insurance authorization.  Follow Up Recommendations Outpatient PT (continue current therapy program)      Assistance Recommended at Discharge Frequent or constant Supervision/Assistance  Patient can return home with the following  A little help with walking and/or transfers;A little help with bathing/dressing/bathroom;Direct supervision/assist for medications management;Assist for transportation    Equipment Recommendations None recommended by PT  Recommendations for Other Services       Functional Status Assessment Patient has had a recent decline in their functional status and demonstrates the ability to make significant improvements in function in a reasonable and predictable amount of time.     Precautions / Restrictions Precautions Precautions: Fall Precaution Comments: h/o falls Required Braces or Orthoses: Other Brace Other Brace: L  AFO Restrictions Weight Bearing Restrictions: No      Mobility  Bed Mobility Overal bed mobility: Needs Assistance Bed Mobility: Supine to Sit     Supine to sit: Supervision     General bed mobility comments: HOB elevated, increased time, supervision for safety but no physical assist    Transfers Overall transfer level: Needs assistance Equipment used: Rolling walker (2 wheels) Transfers: Sit to/from Stand Sit to Stand: Min guard           General transfer comment: verbal cues for safe hand placement, increased time, min guard for safety    Ambulation/Gait Ambulation/Gait assistance: Min assist Gait Distance (Feet): 20 Feet Assistive device: Rolling walker (2 wheels) Gait Pattern/deviations: Step-to pattern, Decreased stride length, Narrow base of support Gait velocity: dec Gait velocity interpretation: <1.8 ft/sec, indicate of risk for recurrent falls   General Gait Details: L LE with drop foot, pt has AFO for L LE, didn't wear this session  Stairs            Wheelchair Mobility    Modified Rankin (Stroke Patients Only) Modified Rankin (Stroke Patients Only) Pre-Morbid Rankin Score: Moderate disability Modified Rankin: Moderate disability     Balance Overall balance assessment: Needs assistance Sitting-balance support: Feet supported, No upper extremity supported Sitting balance-Leahy Scale: Fair Sitting balance - Comments: posterior lean when kicking out L LE   Standing balance support: Bilateral upper extremity supported, During functional activity, Reliant on assistive device for balance Standing balance-Leahy Scale: Poor Standing balance comment: requires RW for safe standing/amb                             Pertinent Vitals/Pain Pain Assessment Pain Assessment: No/denies pain  Home Living Family/patient expects to be discharged to:: Other (Comment) ([place])                   Additional Comments: Pt and  wife live in [place]    Prior Function Prior Level of Function : Needs assist             Mobility Comments: uses RW for very short distances but predominately uses power w/c ADLs Comments: wife sets out clothes pt can dress himself, caregiver assist with transfer into shower but he can bath himself     Hand Dominance   Dominant Hand: Right    Extremity/Trunk Assessment   Upper Extremity Assessment Upper Extremity Assessment: Defer to OT evaluation    Lower Extremity Assessment Lower Extremity Assessment: Generalized weakness    Cervical / Trunk Assessment Cervical / Trunk Assessment: Kyphotic (forward head)  Communication   Communication: No difficulties (expressive aphasia has resolved)  Cognition     Overall Cognitive Status: Within Functional Limits for tasks assessed                                 General Comments: pt at baseline        General Comments General comments (skin integrity, edema, etc.): IV in R elbow was out upon PT arrival, RN notified and came to address    Exercises     Assessment/Plan    PT Assessment Patient needs continued PT services  PT Problem List Decreased strength;Decreased activity tolerance;Decreased balance;Decreased mobility;Decreased coordination       PT Treatment Interventions DME instruction;Gait training;Functional mobility training;Therapeutic activities;Therapeutic exercise;Balance training;Neuromuscular re-education    PT Goals (Current goals can be found in the Care Plan section)  Acute Rehab PT Goals Patient Stated Goal: home today PT Goal Formulation: With patient Time For Goal Achievement: 10/15/21 Potential to Achieve Goals: Good    Frequency Min 3X/week     Co-evaluation               AM-PAC PT "6 Clicks" Mobility  Outcome Measure Help needed turning from your back to your side while in a flat bed without using bedrails?: None Help needed moving from lying on your  back to sitting on the side of a flat bed without using bedrails?: None Help needed moving to and from a bed to a chair (including a wheelchair)?: A Little Help needed standing up from a chair using your arms (e.g., wheelchair or bedside chair)?: A Little Help needed to walk in hospital room?: A Little Help needed climbing 3-5 steps with a railing? : A Lot 6 Click Score: 19    End of Session Equipment Utilized During Treatment: Gait belt Activity Tolerance: Patient tolerated treatment well Patient left: in chair;with call bell/phone within reach;with chair alarm set Nurse Communication: Mobility status PT Visit Diagnosis: Unsteadiness on feet (R26.81);Muscle weakness (generalized) (M62.81)    Time: 8325-4982 PT Time Calculation (min) (ACUTE ONLY): 26 min   Charges:   PT Evaluation $PT Eval Moderate Complexity: 1 Mod PT Treatments $Gait Training: 8-22 mins        Kittie Plater, PT, DPT Acute Rehabilitation Services Secure chat preferred Office #: [phone]   Berline Lopes 10/01/2021, 9:18 AM

## 2021-10-01 NOTE — TOC Transition Note (Signed)
Transition of Care [facility]) - CM/SW Discharge Note   Patient Details  Name: [name] MRN: [id number] Date of Birth: 02-Mar-1931  [facility] CM/SW Contact:  Pollie Friar, RN Phone Number: 10/01/2021, 11:45 AM   Clinical Narrative:    Pt is from [facility]. He lives with his spouse and has a caregiver 5 hours a day/ 4 days a week.  DME at home: rollator/ shower seat/ motor wheelchair Pt states his wife or caregiver does the driving.  Pt states his wife oversees his medications.  Recommendations for outpatient/HH services. Pt has been active with [facility] therapy ([place]) off and on. He would like to use  their services again. CM has called [facility] and faxed the orders for outpatient therapy (facility preference) to [facility]: [phone].  Pt's spouse and caregiver will provide transport home.    Final next level of care: OP Rehab Barriers to Discharge: No Barriers Identified   Patient Goals and CMS Choice     Choice offered to / list presented to : Patient  Discharge Placement                       Discharge Plan and Services                                     Social Determinants of Health (SDOH) Interventions     Readmission Risk Interventions    07/31/2021   12:02 PM  Readmission Risk Prevention Plan  Transportation Screening Complete  PCP or Specialist Appt within 3-5 Days Complete  HRI or [place] Complete  Social Work Consult for [place] Planning/Counseling Complete  Palliative Care Screening Not Applicable  Medication Review [employer]) Complete

## 2021-10-01 NOTE — Progress Notes (Signed)
Pt is from [facility]

## 2021-10-02 ENCOUNTER — Telehealth: Payer: Self-pay | Admitting: Adult Health

## 2021-10-02 NOTE — Telephone Encounter (Signed)
Wife reports pt came home from the hospital yesterday.  They added (DEPAKOTE  '250mg'$  in the morning in addition to the 500 mg in the evening.  Wife states the hospital also suggested pt stops his pain medication Tramadol(which is managed by pt's PCP) wife states she has reached out to speak with PCP re: the Tramadol.  Wife would like a call back to know what Janett Billow, NP thinks of changes made by hospital.

## 2021-10-03 DIAGNOSIS — R300 Dysuria: Secondary | ICD-10-CM | POA: Diagnosis not present

## 2021-10-06 NOTE — Telephone Encounter (Signed)
Wife called back and I reviewed NP's message with her. He has appointment with PCP on Wed. And she will discuss Tramadol.  She stated he is to see NP 4 weeks post hospital stay. Scheduled him for 60 minute FU. Wife  verbalized understanding, appreciation.

## 2021-10-06 NOTE — Telephone Encounter (Signed)
That is fine to add the additional dosage in the morning in setting of recurrent seizure. Would recommend they further discuss pain management and use of tramadol with PCP as tramadol can lower seizure threshold. Thank you.

## 2021-10-06 NOTE — Telephone Encounter (Signed)
Called wife, left detailed VM of NP's advice and recommendations. Left # for further questions.

## 2021-10-06 NOTE — Telephone Encounter (Signed)
Noted! Thank you

## 2021-10-08 DIAGNOSIS — E1169 Type 2 diabetes mellitus with other specified complication: Secondary | ICD-10-CM | POA: Diagnosis not present

## 2021-10-08 DIAGNOSIS — I5042 Chronic combined systolic (congestive) and diastolic (congestive) heart failure: Secondary | ICD-10-CM | POA: Diagnosis not present

## 2021-10-08 DIAGNOSIS — G8929 Other chronic pain: Secondary | ICD-10-CM | POA: Diagnosis not present

## 2021-10-08 DIAGNOSIS — G459 Transient cerebral ischemic attack, unspecified: Secondary | ICD-10-CM | POA: Diagnosis not present

## 2021-10-08 DIAGNOSIS — K5791 Diverticulosis of intestine, part unspecified, without perforation or abscess with bleeding: Secondary | ICD-10-CM | POA: Diagnosis not present

## 2021-10-09 DIAGNOSIS — M25562 Pain in left knee: Secondary | ICD-10-CM | POA: Diagnosis not present

## 2021-10-09 DIAGNOSIS — R278 Other lack of coordination: Secondary | ICD-10-CM | POA: Diagnosis not present

## 2021-10-09 DIAGNOSIS — I5042 Chronic combined systolic (congestive) and diastolic (congestive) heart failure: Secondary | ICD-10-CM | POA: Diagnosis not present

## 2021-10-09 DIAGNOSIS — R262 Difficulty in walking, not elsewhere classified: Secondary | ICD-10-CM | POA: Diagnosis not present

## 2021-10-09 DIAGNOSIS — M6281 Muscle weakness (generalized): Secondary | ICD-10-CM | POA: Diagnosis not present

## 2021-10-13 DIAGNOSIS — J45909 Unspecified asthma, uncomplicated: Secondary | ICD-10-CM | POA: Diagnosis not present

## 2021-10-13 DIAGNOSIS — E1169 Type 2 diabetes mellitus with other specified complication: Secondary | ICD-10-CM | POA: Diagnosis not present

## 2021-10-13 DIAGNOSIS — I10 Essential (primary) hypertension: Secondary | ICD-10-CM | POA: Diagnosis not present

## 2021-10-13 DIAGNOSIS — G4700 Insomnia, unspecified: Secondary | ICD-10-CM | POA: Diagnosis not present

## 2021-10-13 DIAGNOSIS — E7800 Pure hypercholesterolemia, unspecified: Secondary | ICD-10-CM | POA: Diagnosis not present

## 2021-10-13 DIAGNOSIS — D500 Iron deficiency anemia secondary to blood loss (chronic): Secondary | ICD-10-CM | POA: Diagnosis not present

## 2021-10-14 DIAGNOSIS — M6281 Muscle weakness (generalized): Secondary | ICD-10-CM | POA: Diagnosis not present

## 2021-10-14 DIAGNOSIS — I5042 Chronic combined systolic (congestive) and diastolic (congestive) heart failure: Secondary | ICD-10-CM | POA: Diagnosis not present

## 2021-10-14 DIAGNOSIS — R278 Other lack of coordination: Secondary | ICD-10-CM | POA: Diagnosis not present

## 2021-10-16 ENCOUNTER — Ambulatory Visit: Payer: Self-pay

## 2021-10-16 NOTE — Patient Outreach (Signed)
  Care Coordination   Initial Visit Note   10/16/2021 Name: [name] MRN: [id number] DOB: 26-Jul-1931  [name] is a 88 y.o. year old male who sees Polite, Jori Moll, MD for primary care. I spoke with  Mindi Slicker Rosenboom's family member by phone today.  What matters to the patients health and wellness today?  No Concerns Expressed    Goals Addressed   None     SDOH assessments and interventions completed:  No     Care Coordination Interventions Activated:  No  Care Coordination Interventions:  No, not indicated   Follow up plan:  Requested a call back. Will contact next week.    Encounter Outcome:  Pt. Visit Completed   [place] [facility] [phone]

## 2021-10-21 DIAGNOSIS — M6281 Muscle weakness (generalized): Secondary | ICD-10-CM | POA: Diagnosis not present

## 2021-10-21 DIAGNOSIS — R278 Other lack of coordination: Secondary | ICD-10-CM | POA: Diagnosis not present

## 2021-10-28 DIAGNOSIS — R278 Other lack of coordination: Secondary | ICD-10-CM | POA: Diagnosis not present

## 2021-10-28 DIAGNOSIS — M6281 Muscle weakness (generalized): Secondary | ICD-10-CM | POA: Diagnosis not present

## 2021-10-29 ENCOUNTER — Ambulatory Visit: Payer: Medicare Other | Admitting: Adult Health

## 2021-11-03 ENCOUNTER — Ambulatory Visit: Payer: Medicare Other | Admitting: Adult Health

## 2021-11-03 ENCOUNTER — Encounter: Payer: Self-pay | Admitting: Adult Health

## 2021-11-03 VITALS — BP 130/85 | HR 68

## 2021-11-03 DIAGNOSIS — G2581 Restless legs syndrome: Secondary | ICD-10-CM | POA: Diagnosis not present

## 2021-11-03 DIAGNOSIS — Z8673 Personal history of transient ischemic attack (TIA), and cerebral infarction without residual deficits: Secondary | ICD-10-CM

## 2021-11-03 DIAGNOSIS — R479 Unspecified speech disturbances: Secondary | ICD-10-CM

## 2021-11-03 DIAGNOSIS — Z5181 Encounter for therapeutic drug level monitoring: Secondary | ICD-10-CM

## 2021-11-03 DIAGNOSIS — G40109 Localization-related (focal) (partial) symptomatic epilepsy and epileptic syndromes with simple partial seizures, not intractable, without status epilepticus: Secondary | ICD-10-CM | POA: Diagnosis not present

## 2021-11-03 MED ORDER — DIVALPROEX SODIUM 125 MG PO CSDR: DELAYED_RELEASE_CAPSULE | ORAL | 11 refills | Status: DC

## 2021-11-03 MED ORDER — ROPINIROLE HCL 4 MG PO TABS: 4.0000 mg | ORAL_TABLET | Freq: Every day | ORAL | 3 refills | Status: AC

## 2021-11-03 NOTE — Patient Instructions (Addendum)
Your Plan:  Continue Depakote '250mg'$  AM and '500mg'$  PM for seizure prevention  Continue Requip '4mg'$  nightly     Follow up in 6 months or call earlier if needed     Thank you for coming to see [place] at [facility]. I hope we have been able to provide you high quality care today.  You may receive a patient satisfaction survey over the next few weeks. We would appreciate your feedback and comments so that we may continue to improve ourselves and the health of our patients.

## 2021-11-03 NOTE — Progress Notes (Signed)
[facility]  PATIENT: [name] DOB: 1931/08/25   REASON FOR VISIT: hx of multiple strokes/TIAs HISTORY FROM: Patient and wife   Chief Complaint  Patient presents with   Follow-up stroke    Pt is not feeling too good. He states he has a lot of pain throughout his body. Wife has questions about hydrocodone, going to see orthopedic soon and having too much cortisone shots and question about the seizure he had. Room 2 alone      HISTORY OF PRESENT ILLNESS:   Update 11/03/2021 JM: Patient returns for hospital follow-up.  He presented to ED on 8/15 after a 30 to 40-minute episode of incomprehensible speech, largely normalized by the time he presented to ED. MRI unremarkable for new findings.  Depakote dosage increased to '250mg'$  AM and 500 mg PM (previously on 500 mg PM).  He has not had any additional seizure-like activity since that time.  Tolerating increased dosage without side effects.  Restless legs stable on Requip 4 mg nightly, denies side effects  Compliant on aspirin and simvastatin for secondary stroke prevention measures.  Blood pressure well controlled.  Closely follows with PCP Dr. Delfina Redwood.  Wife does mention chronic pain issues especially in both shoulders and knees, he is closely followed by orthopedics, is scheduled to be seen this Thursday and next Tuesday, possibly will receive cortisone injection.  He is also on hydrocodone by PCP typically takes 1 at night and recently started him taking Tylenol PM before bedtime which has significantly helped his pain.  No further concerns at this time     History provided for reference purposes only Update 09/11/2021 JM: Patient returns for 26-monthfollow-up accompanied by his wife  Wife notes additional transient incomprehensible speech episode since prior visit, only last 1-2 minutes then subsided without intervention. Unable to recall date of episode but only occurred once over the past 4 months. Has been  compliant on depakote '500mg'$  nightly.  He has had 2 hospitalizations since prior visit on 5/7 and 6/11 for recurrent GI bleeds requiring transfusions. Therapies rec'd SNF at d/c but family declined and opted to return back to ALF with HH. Continues working with PT and OT, plans on completing PT in the next 1-2 weeks. He notes greater difficulty with ambulation and has been more reliant on power w/c. He recently obtained LLE brace for hyperextension and chronic knee pain. He notes persistent generalized fatigue since discharge.   Wife also notes gradual decline of cognition/memory. He has had greater difficulty with short term memory and delayed recall. She also notes gradual decline of vision, was seen by Dr. GKaty Fitchlast month with stable exam per wife. Upon further discussion, seems to be having more issues with concentration and attention difficulties in regards to reading. Denies actual difficulty seeing words, declines diplopia or visual loss.   Restless leg has improved since increasing Requip dose currently on '4mg'$  nightly, denies side effects.   Has not had any additional issues with bleeding. Has since been seen by GI as well as PCP Dr. PDelfina Redwoodwith follow-up with both next month. Reports repeat labs since June admission which was stable per wife. Has continued on iron supplements.   Aspirin has since been restarted, initially placed on hold in setting of GI bleed, no issues since restarting aspirin. Compliant on simvastatin without side effects. Blood pressure today 127/74, stable at home.   No further concerns at this time   Update 05/12/2021 JM: patient returns for recent hospital follow-up  accompanied by his wife.  Presented to ED on 03/08/2021 for 15-minute episode of incomprehensible speech.  Evaluated by Dr. Erlinda Hong who felt symptoms possibly in setting of TIA vs seizure vs complex migraine.  MRI negative.  CTA head/neck negative LVO.  EF 55 to 60%.  EEG moderate diffuse slowing indicative of global  cerebral dysfunction, no epileptiform abnormalities noted.  Recommended aspirin 81 mg daily and initiated Depakote for possible seizure vs complicated migraine prophylaxis. Remained in back brace for recent compression fractures and PT/OT recommended SNF.   Since discharge, patient has been doing well. He has since returned back home - stayed in rehab for 3 weeks.  No reoccurring speech or language difficulty since discharge. Cognition has greatly improved and currently at baseline. He completed home health therapy last week - continues to do HEP. Of note, at prior visit, he was started on zonisamide for concern of seizure activity with these recurrent speech impairment episodes but apparently he stopped as medication as it made him too sleepy. He reports compliance on Depakote 500 mg nightly ('125mg'$  4 caps nightly due to difficulty swallowing tablet) which he has been tolerating without side effects. He also notes continued issues with RLS - currently takes Requip 3 mg nightly - asks if this can be further increased.  Compliant on aspirin and simvastatin, denies side effects.  Blood pressure today 138/72.  No further concerns at this time.   Update 12/09/2020 JM: Returns for routine stroke follow-up. Called office 05/28/2020 for concern of TIA on 05/25/2020 with speech disturbance that resolved after approximately 20 minutes.  He did not seek emergency evaluation. Reports additional episode in June - per wife, speech impairment more severe than prior episodes (eval by EMS but returned to baseline quickly - did not proceed to ED).  Reports mental fatigue after episodes which usually lasts the rest of th day. Concern of events possibly occurring while sleeping as he may wake up feeling "fuzzy" in the morning - more recently happening in September.  Similar episode 04/06/2020 (see below) and recommended trialing increased aspirin dose '81mg'$  to 325 mg daily (if cleared by GI) but had recurrent GI bleeds on full dose  aspirin (08/2020) as well as hx of recurrent bleeds on Plavix.  He has remained on aspirin 81 mg daily without any reoccurring GI bleeds as well as simvastatin 40 mg daily without side effects.  Blood pressure today 128/78.  Routinely follows with PCP for aggressive stroke risk factor management.  Remains on Requip 3 mg nightly for longstanding RLS with occasional worsening symptoms 2-3 nights per month. Wife concerned regarding gradual decline of ambulation due to chronic generalized pain -routinely followed by orthopedics receiving cortisone injections every 3 months.  No further concerns at this time.    Update 04/10/2020 JM: Mr. Laforge returns for acute visit accompanied by his wife with concern of possible recurrent TIA.  Reports on 04/06/2020 he had 20 to 30 minute episode of slurred speech and slight word finding difficulty Denies weakness, facial droop, visual changes, confusion or AMS, numbness/tingling, or headache Evaluated by EMS - slightly elevated blood pressure but otherwise EKG and other vitals normal No symptoms prior to onset or after event such as fatigue or confusion  He has had similar episodes previously in 2010, 2015, 2017 and 2020 dx with TIA vs seizure type episode He denies personal prior diagnosis or family history of migraines or confirmed seizures  He is currently only on aspirin 81 mg daily as he had continued recurrent  GI bleeds with acute blood loss anemia while on Plavix most recently in 12/2019.  Plavix was discontinued at that time. Continues to follow with GI personally reviewed OV note and they felt chronic Plavix use possibly contributing to continued recurrent GI bleeds.  He has not had any additional bleeding or concerns on aspirin alone.  He does question potential need of Plavix  Reports routine lab work with PCP Dr. Delfina Redwood which has been satisfactory (unable to view via epic) Blood pressure routinely monitored which has been stable  No further concerns at this  time  Update 10/26/2019 JM:  Mr. Labat returns for follow-up regarding history of chronic strokes, TIAs and TIA versus seizure episode He is accompanied by his wife He has been stable from a neurological standpoint since prior visit without new or reoccurring stroke/TIA symptoms or seizure type symptoms. Residual stroke deficits of left spastic hemiparesis which has been stable not worsening Currently using Requip 2 mg nightly for LLE "jerking" previously working well but more recently he has noticed worsening symptoms and is requesting increased Requip dosage Continues to use AFO brace and Rollator walker at all times and denies any recent falls has had ED admissions since prior visit for acute on chronic GI bleed due to diverticulosis requiring transfusions with most recent admission 10/10/2019.  Recommended to hold Plavix for 1 week.  He has since restarted Plavix without evidence of reoccurring bleeding and denies bruising Continues on simvastatin without myalgias Blood pressure today 120/73 Continues to follow closely with PCP for HTN, HLD and DM management No further concerns   Update 09/28/2018 Dr. Leonie Man : He returns for follow-up after last visit with me in April 2019.  He is accompanied by his wife.  He had an episode of possible TIA versus seizure in March 2020.  Patient had been admitted for diverticular bleed to the hospital and had been off Plavix for 5 days.  After the patient was discharged home in the same evening the wife noticed that he had speech difficulties and was unable to speak or verbalize he had was able unable to respond to the wife and a blank look on his face.  She called 911 patient went back to the hospital this episode of speech difficulty lasted 10 to 20 minutes.  Work-up in the emergency room included a CT scan without contrast which did not show acute abnormality.  CT angiogram of the head and neck was obtained which did not show any major stenosis.  MRI scan of the  brain was also obtained which showed old basal ganglia infarct without any acute infarct and changes of small vessel disease and generalized atrophy.  Patient has done well since discharge has had no further recurrent episodes of speech disturbance, TIA or seizures.  Is tolerating Plavix with only minor bruising.  His blood pressures well controlled today 121/62.  His last hemoglobin A1c was 6.6 checked 2 months ago.  He remains on Zocor which is tolerating well without myalgias but does have some knee and foot pain.  Continues to have gait and balance difficulties.  He does use a wheeled walker and states he is careful and has not had any major falls or injuries.  He is having dysphagia and plans to have elective esophageal dilatation to be done by Dr. Ardis Hughs on 10/07/2018 and wants neurological clearance and to hold Plavix for the procedure.  Update the 06/09/2017 Dr. Leonie Man :he returns for follow-up after last for this 6 months ago. He is accompanied  by his wife. He has not had any recurrent stroke or TIA symptoms. He continues to have left leg weakness and difficulty with gait and balance. He does use a wheeled walker all the time. He has not had any falls or injuries recently. He states his memory difficulties unchanged and are not progressive. His remains on Plavix which is tolerating well with only minor bruising but no bleeding. States his blood pressure is under good control today it is 124/72. He is tolerating Zocor well and last lipid profile checked for satisfactory by his primary physician. He was diagnosed with pneumonia 2 weeks ago by his primary physician and given a course of antibiotics and is now improving.  Update 11/25/2016 Dr. Leonie Man :  He returns for follow-up after last visit 4 months ago. He is accompanied by his wife. His abnormal further recurrent TIA or stroke symptoms. He had to stop Plavix for a week as he had some rectal bleed which was felt to be diverticular bleed. He has since  resumed Plavix and is doing all right without further bleeding problems. He continues to walk with a walker due to stiffness and weakness in left leg as well as left knee pain which is bothersome. He recently got intra-articular injection by Dr.: Into his left knee. Uses a wheeled walker regularly. He states is careful and has not had any recent falls. He is complaining of mild short-term memory difficulties as well as double finding words and completing sentences. He does not participate in any mentally challenging activities.  UPDATE 07/22/2016.CM Mr. Manseau, 88 year old male returns for follow-up with possible TIA event over the weekend where he had slurred speech and right hand numbness for 30 minutes. He is currently on aspirin 325 mg daily. He is on Zocor for hyperlipidemia and is due to have labs next week his primary care. Blood pressure in the office today 149/78 he is continuing to get physical therapy 2 times a week for left lower extremity weakness since admission for stroke and 02/22/2016 . Carotid Doppler at that time 1-39% bilateral ICA stenosis negative MRA of the head MRI with right lateral thalamus and right posterior limb internal capsule infarct. He had another admission to the ER on 05/04/2016 for aphasia/ TIA. MRI without acute infarct. Marland Kitchen He lives in independent living at [facility]. He returns for reevaluation  HISTORY 07/2015 Dr. Cyndra Numbers had acute onset of dizziness on 07/25/15 and was admitted to [facility]. MRI showed tiny acute infarct in the superior left occipital lobe. CTA head and neck unremarkable. EF 45-50%, A1C 6.5, LDL not checked. His investigational meds were discontinued. He was put on plavix. Further embolic work up deferred as he would like to be discharged the 2nd day.    Discussed with pt and his wife that he had recurrent episodes of word finding difficulty. MRI on 12/22/13 showed acute infarct at left temporal cortical region. He was enrolled to RESPECT ESUS trial to compare ASA  vs. pradaxa in 04/2014. However, he had another TIA episode with word finding difficulty and MRI negative. He was managed to be remained in the trial. This time his symptoms are different from previous, but MRI showed acute tiny infarct at left occipital lobe. He had 30 day cardiac monitoring in the past with Dr. Wynonia Lawman was told negative for afib. During to recurrent episodes and embolic pattern, we can either put him on coumadin and INR 2-3 or continue plavix and check TEE and consider loop recorder. Pros and cons of either  approach have been discussed with pt and wife. Pt more leaning towards coumadin and wife leaning toward the other. They would like to further discuss with PCP Dr. Delfina Redwood and let me know. I will forward the note to Dr. Delfina Redwood today and they will call him next week.          REVIEW OF SYSTEMS: Full 14 system review of systems performed and notable only for those listed in HPI, all others are neg   ALLERGIES: Allergies  Allergen Reactions   Tape Other (See Comments)    Pt has sensitive skin, skin tears and bruises easily - please use paper tape.    HOME MEDICATIONS: Outpatient Medications Prior to Visit  Medication Sig Dispense Refill   acetaminophen (TYLENOL) 325 MG tablet Take 2 tablets (650 mg total) by mouth every 6 (six) hours as needed for mild pain, fever or headache. (Patient taking differently: Take 650 mg by mouth 2 (two) times daily.) 100 tablet 2   albuterol (VENTOLIN HFA) 108 (90 Base) MCG/ACT inhaler Inhale 2 puffs into the lungs every 6 (six) hours as needed for wheezing or shortness of breath.     aspirin EC 81 MG tablet Take 81 mg by mouth daily. Swallow whole.     budesonide (PULMICORT) 0.5 MG/2ML nebulizer solution Take 2 mLs by nebulization See admin instructions. Inhale 2 ml via nebulizer daily. May repeat once if needed.     carvedilol (COREG) 3.125 MG tablet Take 1 tablet (3.125 mg total) by mouth 2 (two) times daily with a meal.     Cholecalciferol  (VITAMIN D-3 PO) Take 1 tablet by mouth in the morning.     divalproex (DEPAKOTE SPRINKLE) 125 MG capsule Take 2-4 capsules (250-500 mg total) by mouth See admin instructions. Taking 2 capsules ( 250 mg) in the AM and 4 capsules ( 500 mg) in the evening 180 capsule 0   ferrous sulfate 325 (65 FE) MG tablet Take 1 tablet (325 mg total) by mouth 2 (two) times daily. 30 tablet 1   HYDROcodone-acetaminophen (NORCO/VICODIN) 5-325 MG tablet Take 1 tablet by mouth daily as needed.     MELATONIN PO Take 1 tablet by mouth daily.     metFORMIN (GLUCOPHAGE) 500 MG tablet Take 500 mg by mouth daily.     pantoprazole (PROTONIX) 40 MG tablet Take 1 tablet (40 mg total) by mouth daily with breakfast. 90 tablet 3   polyethylene glycol (MIRALAX / GLYCOLAX) 17 g packet Take 17 g by mouth daily.     predniSONE (DELTASONE) 10 MG tablet Take 5 mg by mouth daily.      psyllium (METAMUCIL) 58.6 % packet Take 1 packet by mouth daily.     rOPINIRole (REQUIP) 4 MG tablet Take 1 tablet (4 mg total) by mouth at bedtime. 30 tablet 5   simvastatin (ZOCOR) 40 MG tablet Take 1 tablet (40 mg total) by mouth daily. (Patient taking differently: Take 40 mg by mouth at bedtime.) 30 tablet 3   sodium chloride (OCEAN) 0.65 % SOLN nasal spray Place 1 spray into both nostrils 2 (two) times daily as needed (dryness).     No facility-administered medications prior to visit.    PAST MEDICAL HISTORY: Past Medical History:  Diagnosis Date   Acute CVA (cerebrovascular accident) ([place]) 07/25/2015   Asthma    Benign essential HTN    Cerebral infarction due to embolism of left middle cerebral artery ([place]) 02/03/2014   Chronic combined systolic and diastolic CHF (congestive heart failure) ([place])  12/23/2017   Colitis    Colon polyps    Diabetes mellitus type 2, diet-controlled ([place]) 12/22/2013   Diverticulosis    GERD (gastroesophageal reflux disease)    GI bleed    GI bleed    Hemorrhoids    Hiatal hernia    History of esophageal  strciture    HLD (hyperlipidemia)    Osteoarthritis    Proctitis    Status post dilation of esophageal narrowing    Stroke [facility])     PAST SURGICAL HISTORY: Past Surgical History:  Procedure Laterality Date   ABDOMINAL HERNIA REPAIR     BIOPSY  04/05/2019   Procedure: BIOPSY;  Surgeon: Thornton Gest, MD;  Location: [place];  Service: Gastroenterology;;   COLONOSCOPY     multiple   COLONOSCOPY WITH PROPOFOL N/A 12/12/2016   Procedure: COLONOSCOPY WITH PROPOFOL;  Surgeon: Gatha Mayer, MD;  Location: [place];  Service: Endoscopy;  Laterality: N/A;   COLONOSCOPY WITH PROPOFOL N/A 04/04/2019   Procedure: COLONOSCOPY WITH PROPOFOL;  Surgeon: Milus Banister, MD;  Location[facility] ENDOSCOPY;  Service: Endoscopy;  Laterality: N/A;   ENTEROSCOPY N/A 01/16/2020   Procedure: ENTEROSCOPY;  Surgeon: Rush Landmark Telford Nab., MD;  Location: [facility]OSCOPY;  Service: Gastroenterology;  Laterality: N/A;   ENTEROSCOPY N/A 01/20/2020   Procedure: ENTEROSCOPY;  Surgeon: Rush Landmark Telford Nab., MD;  Location: [facility];  Service: Gastroenterology;  Laterality: N/A;   ENTEROSCOPY N/A 09/16/2020   Procedure: ENTEROSCOPY;  Surgeon: Ladene Artist, MD;  Location: [facility] ENDOSCOPY;  Service: Endoscopy;  Laterality: N/A;  push enteroscopy   ESOPHAGOGASTRODUODENOSCOPY     multiple   ESOPHAGOGASTRODUODENOSCOPY (EGD) WITH PROPOFOL N/A 04/05/2019   Procedure: ESOPHAGOGASTRODUODENOSCOPY (EGD) WITH PROPOFOL;  Surgeon: Thornton Loretto, MD;  Location: [place];  Service: Gastroenterology;  Laterality: N/A;   ESOPHAGOGASTRODUODENOSCOPY (EGD) WITH PROPOFOL N/A 01/18/2020   Procedure: ESOPHAGOGASTRODUODENOSCOPY (EGD) WITH PROPOFOL;  Surgeon: Doran Stabler, MD;  Location: [facility] ENDOSCOPY;  Service: Gastroenterology;  Laterality: N/A;  For placement of video capsule study   GIVENS CAPSULE STUDY N/A 01/16/2020   Procedure: [place];  Surgeon: Irving Copas., MD;  Location: [facility] ENDOSCOPY;   Service: Gastroenterology;  Laterality: N/A;   GIVENS CAPSULE STUDY N/A 01/18/2020   Procedure: GIVENS CAPSULE STUDY;  Surgeon: Doran Stabler, MD;  Location: [facility] ENDOSCOPY;  Service: Gastroenterology;  Laterality: N/A;   HOT HEMOSTASIS N/A 01/20/2020   Procedure: HOT HEMOSTASIS (ARGON PLASMA COAGULATION/BICAP);  Surgeon: Irving Copas., MD;  Location: [facility];  Service: Gastroenterology;  Laterality: N/A;   HOT HEMOSTASIS N/A 09/16/2020   Procedure: HOT HEMOSTASIS (ARGON PLASMA COAGULATION/BICAP);  Surgeon: Ladene Artist, MD;  Location: [facility];  Service: Endoscopy;  Laterality: N/A;   INSERTION OF MESH  01/29/2012   Procedure: INSERTION OF MESH;  Surgeon: Gwenyth Ober, MD;  Location: [place];  Service: General;  Laterality: N/A;   IR ANGIOGRAM SELECTIVE EACH ADDITIONAL VESSEL  04/01/2019   IR ANGIOGRAM VISCERAL SELECTIVE  04/01/2019   IR ANGIOGRAM VISCERAL SELECTIVE  04/01/2019   IR ANGIOGRAM VISCERAL SELECTIVE  04/01/2019   IR US GUIDE VASC ACCESS RIGHT  04/23/6576   NISSEN FUNDOPLICATION     SAVORY DILATION N/A 01/16/2020   Procedure: SAVORY DILATION;  Surgeon: Irving Copas., MD;  Location: [facility] ENDOSCOPY;  Service: Gastroenterology;  Laterality: N/A;   SAVORY DILATION N/A 09/16/2020   Procedure: SAVORY DILATION;  Surgeon: Ladene Artist, MD;  Location: [facility] ENDOS[facility];  Service: Endoscopy;  Laterality: N/A;  SCLEROTHERAPY  01/20/2020   Procedure: Clide Deutscher;  Surgeon: Mansouraty, Telford Nab., MD;  Location: [facility];  Service: Gastroenterology;;   SPLENECTOMY, TOTAL     nontraumatic rupture   SUBMUCOSAL TATTOO INJECTION  01/16/2020   Procedure: SUBMUCOSAL TATTOO INJECTION;  Surgeon: Irving Copas., MD;  Location: [facility];  Service: Gastroenterology;;   VENTRAL HERNIA REPAIR  01/29/2012    WITH MESH   VENTRAL HERNIA REPAIR  01/29/2012   Procedure: HERNIA REPAIR VENTRAL ADULT;  Surgeon: Gwenyth Ober, MD;  Location: [place];  Service: General;   Laterality: N/A;  open recurrent ventral hernia repair with mesh    FAMILY HISTORY: Family History  Problem Relation Age of Onset   Alzheimer's disease Mother    Breast cancer Mother    Throat cancer Father    Colon cancer Neg Hx    Colon polyps Neg Hx    Pancreatic cancer Neg Hx    Stomach cancer Neg Hx    Liver disease Neg Hx    Esophageal cancer Neg Hx    Rectal cancer Neg Hx     SOCIAL HISTORY: Social History   Socioeconomic History   Marital status: Married    Spouse name: [name]   Number of children: 2   Years of education: Bachelor's   Highest education level: Not on file  Occupational History   Occupation: retired  Tobacco Use   Smoking status: Never   Smokeless tobacco: Never  Vaping Use   Vaping Use: Never used  Substance and Sexual Activity   Alcohol use: Yes    Alcohol/week: 2.0 standard drinks of alcohol    Types: 2 Glasses of wine per week    Comment: daily   Drug use: No   Sexual activity: Not Currently  Other Topics Concern   Not on file  Social History Narrative   Patient is married and has 2 children.   Patient is right handed.   Patient has Bachelor's degree.   Patient drinks 2 cups daily.   Social Determinants of Health   Financial Resource Strain: Not on file  Food Insecurity: Not on file  Transportation Needs: Not on file  Physical Activity: Not on file  Stress: Not on file  Social Connections: Not on file  Intimate Partner Violence: Not on file     PHYSICAL EXAM  Vitals:   11/03/21 1501  BP: 130/85  Pulse: 68   There is no height or weight on file to calculate BMI.  Generalized: frail very pleasant elderly Caucasian male, in no acute distress, seated in w/c Head: normocephalic and atraumatic,.   Neck: Supple, no carotid bruits  Cardiac: Regular rate rhythm, no murmur  Musculoskeletal: No deformity . Mild kyphosis  Neurological examination   Mentation: Alert oriented to time, place, history taking. Attention span and  concentration appropriate during visit. Recent memory mildly impaired and remote memory intact.  Follows all commands.  Speech and language fluent.  Cranial nerve II-XII: Pupils were equal round reactive to light extraocular movements were full, visual field were full on confrontational test. Facial sensation and strength were normal. HOH bilaterally. Uvula tongue midline. head turning and shoulder shrug were normal and symmetric.Tongue protrusion into cheek strength was normal. Motor: full strength right upper and lower extremity with normal bulk and tone, Mild LUE grip weakness and intrinsic hand muscles. Orbits right arm over left.  LLE 4+/5 hip flexor and 4/5 ankle dorsiflexion weakness (chronic, stable) with hinged knee brace in place Sensory: normal and symmetric to  light touch, in the upper and lower extremities Coordination: finger-nose-finger performed accurately on right side Reflexes: Brisker in the left upper and lower, plantar responses were flexor bilaterally. Gait and Station: deferred      DIAGNOSTIC DATA (LABS, IMAGING, TESTING) - I reviewed patient records, labs, notes, testing and imaging myself where available.  Lab Results  Component Value Date   WBC 13.0 (H) 09/30/2021   HGB 15.2 09/30/2021   HCT 46.3 09/30/2021   MCV 91.7 09/30/2021   PLT 337 09/30/2021      Component Value Date/Time   NA 136 09/30/2021 1123   NA 136 05/12/2021 1438   K 3.6 09/30/2021 1123   CL 99 09/30/2021 1123   CO2 27 09/30/2021 1123   GLUCOSE 126 (H) 09/30/2021 1123   BUN 15 09/30/2021 1123   BUN 15 05/12/2021 1438   CREATININE 0.68 09/30/2021 2201   CREATININE 0.74 02/08/2012 1209   CALCIUM 8.7 (L) 09/30/2021 1123   PROT 7.0 09/30/2021 1123   PROT 6.0 05/12/2021 1438   ALBUMIN 3.6 09/30/2021 1123   ALBUMIN 4.0 05/12/2021 1438   AST 22 09/30/2021 1123   ALT 13 09/30/2021 1123   ALKPHOS 64 09/30/2021 1123   BILITOT 0.6 09/30/2021 1123   BILITOT <0.2 05/12/2021 1438   GFRNONAA >60  09/30/2021 2201   GFRAA >60 10/13/2019 0536   Lab Results  Component Value Date   CHOL 126 09/30/2021   HDL 54 09/30/2021   LDLCALC 57 09/30/2021   TRIG 77 09/30/2021   CHOLHDL 2.3 09/30/2021   Lab Results  Component Value Date   HGBA1C 5.3 09/30/2021     ASSESSMENT AND PLAN  88 y.o. 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  Recurrent slurred speech  -suspect partial type seizure  -continue Depakote 250AM and '500mg'$  PM - refill provided. Needs to keep Depakote sprinkles as he is unable to swallow pills whole -repeat valproic acid level, LFTs and CBC today  -Intolerant to zonisamide due to increased fatigue  -Advised to call with any additional episodes    2.  Cognitive impairment with gradual decline 3.  Functional impairment with deconditioning  -Stable -Likely multifactorial with history of multiple strokes, chronic pain and multiple other comorbidities  -Likely contributing to attention/concentration difficulties  -Continue to monitor   3.  History of multiple strokes/TIAs  -continue aspirin '81mg'$  daily and simvastatin for secondary stroke prevention measures  -Continue close follow-up with PCP for aggressive stroke risk factor management   3.  RLS  -Stable -Continue Requip to 4 mg nightly - refill provided  4.  Chronic pain  -Continue close PCP follow-up as well as orthopedic follow-up for management and monitoring  -Consider establishing care with pain management but will defer to PCP       Follow-up in 6 months or call earlier if needed    CC:  Seward Carol, MD  (PCP)   I spent 38 minutes of face-to-face and  non-face-to-face time with patient and wife.  This included previsit chart review including review of recent hospitalization, lab review, study review, order entry, electronic health record documentation, patient and wife education and discussion regarding  above diagnoses and treatment plan and answered all other questions to patient and wife satisfaction  Frann Rider, AGNP-BC  [facility] [address] [place], [place] [address]  Phone [phone] Fax [phone] Note: This document was prepared with digital dictation and possible smart phrase technology. Any transcriptional errors that result from this process are unintentional.

## 2021-11-04 ENCOUNTER — Telehealth: Payer: Self-pay

## 2021-11-04 DIAGNOSIS — R278 Other lack of coordination: Secondary | ICD-10-CM | POA: Diagnosis not present

## 2021-11-04 DIAGNOSIS — M6281 Muscle weakness (generalized): Secondary | ICD-10-CM | POA: Diagnosis not present

## 2021-11-04 LAB — CBC WITH DIFFERENTIAL/PLATELET
Basophils Absolute: 0.1 10*3/uL (ref 0.0–0.2)
Basos: 1 %
EOS (ABSOLUTE): 1.8 10*3/uL — ABNORMAL HIGH (ref 0.0–0.4)
Eos: 20 %
Hematocrit: 43.6 % (ref 37.5–51.0)
Hemoglobin: 14.4 g/dL (ref 13.0–17.7)
Immature Grans (Abs): 0 10*3/uL (ref 0.0–0.1)
Immature Granulocytes: 0 %
Lymphocytes Absolute: 1.9 10*3/uL (ref 0.7–3.1)
Lymphs: 21 %
MCH: 29.8 pg (ref 26.6–33.0)
MCHC: 33 g/dL (ref 31.5–35.7)
MCV: 90 fL (ref 79–97)
Monocytes Absolute: 1 10*3/uL — ABNORMAL HIGH (ref 0.1–0.9)
Monocytes: 11 %
Neutrophils Absolute: 4.2 10*3/uL (ref 1.4–7.0)
Neutrophils: 47 %
Platelets: 318 10*3/uL (ref 150–450)
RBC: 4.84 x10E6/uL (ref 4.14–5.80)
RDW: 14 % (ref 11.6–15.4)
WBC: 8.9 10*3/uL (ref 3.4–10.8)

## 2021-11-04 LAB — HEPATIC FUNCTION PANEL
ALT: 9 IU/L (ref 0–44)
AST: 15 IU/L (ref 0–40)
Albumin: 4 g/dL (ref 3.7–4.7)
Alkaline Phosphatase: 70 IU/L (ref 44–121)
Bilirubin Total: <0.2 mg/dL (ref 0.0–1.2)
Bilirubin, Direct: <0.1 mg/dL (ref 0.00–0.40)
Total Protein: 6.2 g/dL (ref 6.0–8.5)

## 2021-11-04 LAB — VALPROIC ACID LEVEL: Valproic Acid Lvl: 48 ug/mL — ABNORMAL LOW (ref 50–100)

## 2021-11-04 NOTE — Telephone Encounter (Signed)
Called pt and spoke with wife per DPR. She understood and appreciated the call.

## 2021-11-04 NOTE — Telephone Encounter (Signed)
-----   Message from Frann Rider, NP sent at 11/04/2021  9:45 AM EDT ----- Please advise patient that recent lab work largely satisfactory.  Depakote level on the lower side of normal but as he has not had any additional seizure activity since dosage increase, no reason at this time to make any further adjustments.  Please continue current treatment plan.

## 2021-11-06 DIAGNOSIS — M19011 Primary osteoarthritis, right shoulder: Secondary | ICD-10-CM | POA: Diagnosis not present

## 2021-11-06 DIAGNOSIS — M19012 Primary osteoarthritis, left shoulder: Secondary | ICD-10-CM | POA: Diagnosis not present

## 2021-11-11 DIAGNOSIS — R278 Other lack of coordination: Secondary | ICD-10-CM | POA: Diagnosis not present

## 2021-11-11 DIAGNOSIS — M6281 Muscle weakness (generalized): Secondary | ICD-10-CM | POA: Diagnosis not present

## 2021-11-13 DIAGNOSIS — J069 Acute upper respiratory infection, unspecified: Secondary | ICD-10-CM | POA: Diagnosis not present

## 2021-11-13 DIAGNOSIS — J4530 Mild persistent asthma, uncomplicated: Secondary | ICD-10-CM | POA: Diagnosis not present

## 2021-11-18 ENCOUNTER — Ambulatory Visit: Payer: Medicare Other | Admitting: Adult Health

## 2021-11-20 DIAGNOSIS — M170 Bilateral primary osteoarthritis of knee: Secondary | ICD-10-CM | POA: Diagnosis not present

## 2021-12-08 DIAGNOSIS — G8929 Other chronic pain: Secondary | ICD-10-CM | POA: Diagnosis not present

## 2021-12-08 DIAGNOSIS — K922 Gastrointestinal hemorrhage, unspecified: Secondary | ICD-10-CM | POA: Diagnosis not present

## 2021-12-08 DIAGNOSIS — Z23 Encounter for immunization: Secondary | ICD-10-CM | POA: Diagnosis not present

## 2021-12-08 DIAGNOSIS — I639 Cerebral infarction, unspecified: Secondary | ICD-10-CM | POA: Diagnosis not present

## 2021-12-12 ENCOUNTER — Telehealth: Payer: Self-pay | Admitting: Gastroenterology

## 2021-12-12 NOTE — Telephone Encounter (Signed)
Patients wife called states the patient has lost control of his bowels and has been experiencing very loose stool they are seeking advise.

## 2021-12-12 NOTE — Telephone Encounter (Signed)
He can try some immodium over the weekend to see if it helps. If symptoms persisting on Monday AM then he should come into the office for a GI pathogen panel. Given the office is about to close (now Friday PM) I don't think he would be able to do this today. If worsening symptoms over the weekend with worsening diarrhea, fever, or abdominal pain, etc then he would need to seek care at urgent care or ED. Thanks

## 2021-12-12 NOTE — Telephone Encounter (Signed)
The pt wife has been advised to take imodium and call back if he is no better on Monday. She knows that if he worsens over the weekend with symptoms as mentioned he should go to the ED or Urgent care. Left message on machine to call back

## 2021-12-12 NOTE — Telephone Encounter (Signed)
The pt has complaints of 3 days watery loose stools with fecal incontinence. No bleeding, mucous, or abd pain.  He has had 3 watery stools since 5 am.  No recent abx use, no sick contacts.  He took 2 imodium a few hours ago so he is not sure if this is going to help or not as of yet. He is on a BRAT diet for now.  Please advise any further req's.    Dr Havery Moros can you please review for Dr Ardis Hughs as DOD?

## 2021-12-13 ENCOUNTER — Emergency Department (HOSPITAL_BASED_OUTPATIENT_CLINIC_OR_DEPARTMENT_OTHER)
Admission: EM | Admit: 2021-12-13 | Discharge: 2021-12-14 | Disposition: A | Discharge status: Home / Self Care | Payer: Medicare Other | Attending: Emergency Medicine | Admitting: Emergency Medicine

## 2021-12-13 ENCOUNTER — Other Ambulatory Visit: Payer: Self-pay

## 2021-12-13 ENCOUNTER — Emergency Department (HOSPITAL_BASED_OUTPATIENT_CLINIC_OR_DEPARTMENT_OTHER): Payer: Medicare Other

## 2021-12-13 ENCOUNTER — Encounter (HOSPITAL_BASED_OUTPATIENT_CLINIC_OR_DEPARTMENT_OTHER): Payer: Self-pay | Admitting: Emergency Medicine

## 2021-12-13 DIAGNOSIS — R197 Diarrhea, unspecified: Secondary | ICD-10-CM

## 2021-12-13 DIAGNOSIS — R569 Unspecified convulsions: Secondary | ICD-10-CM

## 2021-12-13 DIAGNOSIS — E119 Type 2 diabetes mellitus without complications: Secondary | ICD-10-CM | POA: Insufficient documentation

## 2021-12-13 DIAGNOSIS — Z7982 Long term (current) use of aspirin: Secondary | ICD-10-CM | POA: Insufficient documentation

## 2021-12-13 DIAGNOSIS — R258 Other abnormal involuntary movements: Secondary | ICD-10-CM | POA: Insufficient documentation

## 2021-12-13 DIAGNOSIS — Z7984 Long term (current) use of oral hypoglycemic drugs: Secondary | ICD-10-CM | POA: Diagnosis not present

## 2021-12-13 DIAGNOSIS — Z743 Need for continuous supervision: Secondary | ICD-10-CM | POA: Diagnosis not present

## 2021-12-13 DIAGNOSIS — R519 Headache, unspecified: Secondary | ICD-10-CM | POA: Diagnosis not present

## 2021-12-13 DIAGNOSIS — I10 Essential (primary) hypertension: Secondary | ICD-10-CM | POA: Diagnosis not present

## 2021-12-13 DIAGNOSIS — R6889 Other general symptoms and signs: Secondary | ICD-10-CM | POA: Diagnosis not present

## 2021-12-13 LAB — CBC WITH DIFFERENTIAL/PLATELET
Abs Immature Granulocytes: 0.03 10*3/uL (ref 0.00–0.07)
Basophils Absolute: 0 10*3/uL (ref 0.0–0.1)
Basophils Relative: 0 %
Eosinophils Absolute: 0.1 10*3/uL (ref 0.0–0.5)
Eosinophils Relative: 2 %
HCT: 46.9 % (ref 39.0–52.0)
Hemoglobin: 15.2 g/dL (ref 13.0–17.0)
Immature Granulocytes: 0 %
Lymphocytes Relative: 17 %
Lymphs Abs: 1.5 10*3/uL (ref 0.7–4.0)
MCH: 30.9 pg (ref 26.0–34.0)
MCHC: 32.4 g/dL (ref 30.0–36.0)
MCV: 95.3 fL (ref 80.0–100.0)
Monocytes Absolute: 0.9 10*3/uL (ref 0.1–1.0)
Monocytes Relative: 11 %
Neutro Abs: 6 10*3/uL (ref 1.7–7.7)
Neutrophils Relative %: 70 %
Platelets: 303 10*3/uL (ref 150–400)
RBC: 4.92 MIL/uL (ref 4.22–5.81)
RDW: 16.4 % — ABNORMAL HIGH (ref 11.5–15.5)
WBC: 8.6 10*3/uL (ref 4.0–10.5)
nRBC: 0 % (ref 0.0–0.2)

## 2021-12-13 LAB — COMPREHENSIVE METABOLIC PANEL
ALT: 18 U/L (ref 0–44)
AST: 19 U/L (ref 15–41)
Albumin: 3.4 g/dL — ABNORMAL LOW (ref 3.5–5.0)
Alkaline Phosphatase: 56 U/L (ref 38–126)
Anion gap: 6 (ref 5–15)
BUN: 12 mg/dL (ref 8–23)
CO2: 30 mmol/L (ref 22–32)
Calcium: 8.4 mg/dL — ABNORMAL LOW (ref 8.9–10.3)
Chloride: 100 mmol/L (ref 98–111)
Creatinine, Ser: 0.67 mg/dL (ref 0.61–1.24)
GFR, Estimated: >60 mL/min (ref >60)
Glucose, Bld: 109 mg/dL — ABNORMAL HIGH (ref 70–99)
Potassium: 3.9 mmol/L (ref 3.5–5.1)
Sodium: 136 mmol/L (ref 135–145)
Total Bilirubin: 0.7 mg/dL (ref 0.3–1.2)
Total Protein: 6.4 g/dL — ABNORMAL LOW (ref 6.5–8.1)

## 2021-12-13 LAB — URINALYSIS, ROUTINE W REFLEX MICROSCOPIC
Bilirubin Urine: NEGATIVE
Glucose, UA: NEGATIVE mg/dL
Hgb urine dipstick: NEGATIVE
Ketones, ur: NEGATIVE mg/dL
Leukocytes,Ua: NEGATIVE
Nitrite: NEGATIVE
Protein, ur: NEGATIVE mg/dL
Specific Gravity, Urine: 1.015 (ref 1.005–1.030)
pH: 7 (ref 5.0–8.0)

## 2021-12-13 LAB — LIPASE, BLOOD: Lipase: 24 U/L (ref 11–51)

## 2021-12-13 MED ORDER — SODIUM CHLORIDE 0.9 % IV BOLUS
1000.0000 mL | Freq: Once | INTRAVENOUS | Status: AC
  Administered 2021-12-13: 1000 mL via INTRAVENOUS

## 2021-12-13 NOTE — ED Notes (Signed)
Pts wife, [name], notified of pts d/c status. Per wife, she will call back shortly to notify RN of means of transportation home for pt.

## 2021-12-13 NOTE — ED Notes (Signed)
Pt denies abdominal pain at this time.

## 2021-12-13 NOTE — ED Notes (Signed)
Wife at bedside.

## 2021-12-13 NOTE — ED Provider Notes (Signed)
[facility] EMERGENCY DEPARTMENT Provider Note   CSN: [id number] Arrival date & time: 12/13/21  1945     History  Chief Complaint  Patient presents with   Diarrhea    KIRBY CORTESE is a 88 y.o. male.  Patient here after seizure-like episode, diarrhea, concern for dehydration.  History of stroke, seizures on Depakote, high cholesterol, diabetes.  Patient's been dealing with diarrhea in the last several days.  No significant nausea or vomiting.  No sick contacts.  No recent antibiotics.  Patient had seizure per family.  Now back to his baseline.  Has had poor appetite the last several days.  But was able to eat an egg today and some other foods.  Denies any nausea, vomiting, headache, weakness, numbness, vision changes.  The history is provided by the patient.       Home Medications Prior to Admission medications   Medication Sig Start Date End Date Taking? Authorizing Provider  acetaminophen (TYLENOL) 325 MG tablet Take 2 tablets (650 mg total) by mouth every 6 (six) hours as needed for mild pain, fever or headache. Patient taking differently: Take 650 mg by mouth 2 (two) times daily. 03/12/21 03/12/22  Elgergawy, Silver Huguenin, MD  albuterol (VENTOLIN HFA) 108 (90 Base) MCG/ACT inhaler Inhale 2 puffs into the lungs every 6 (six) hours as needed for wheezing or shortness of breath.    [provider]  aspirin EC 81 MG tablet Take 81 mg by mouth daily. Swallow whole.    [provider]  budesonide (PULMICORT) 0.5 MG/2ML nebulizer solution Take 2 mLs by nebulization See admin instructions. Inhale 2 ml via nebulizer daily. May repeat once if needed. 09/01/18   [provider]  carvedilol (COREG) 3.125 MG tablet Take 1 tablet (3.125 mg total) by mouth 2 (two) times daily with a meal. 03/12/21   Elgergawy, Silver Huguenin, MD  Cholecalciferol (VITAMIN D-3 PO) Take 1 tablet by mouth in the morning.    [provider]  divalproex (DEPAKOTE SPRINKLE) 125 MG  capsule Take 2 capsules (250 mg total) by mouth every morning AND 4 capsules (500 mg total) at bedtime. Taking 2 capsules ( 250 mg) in the AM and 4 capsules ( 500 mg) in the evening. 11/03/21   Frann Rider, NP  ferrous sulfate 325 (65 FE) MG tablet Take 1 tablet (325 mg total) by mouth 2 (two) times daily. 08/04/21   Regalado, Belkys A, MD  HYDROcodone-acetaminophen (NORCO/VICODIN) 5-325 MG tablet Take 1 tablet by mouth daily as needed. 10/09/21   [provider]  MELATONIN PO Take 1 tablet by mouth daily.    [provider]  metFORMIN (GLUCOPHAGE) 500 MG tablet Take 500 mg by mouth daily. 08/23/19   [provider]  pantoprazole (PROTONIX) 40 MG tablet Take 1 tablet (40 mg total) by mouth daily with breakfast. 02/04/21   Esterwood, Amy S, PA-C  polyethylene glycol (MIRALAX / GLYCOLAX) 17 g packet Take 17 g by mouth daily.    [provider]  predniSONE (DELTASONE) 10 MG tablet Take 5 mg by mouth daily.  07/11/19   [provider]  psyllium (METAMUCIL) 58.6 % packet Take 1 packet by mouth daily.    [provider]  rOPINIRole (REQUIP) 4 MG tablet Take 1 tablet (4 mg total) by mouth at bedtime. 11/03/21   Frann Rider, NP  simvastatin (ZOCOR) 40 MG tablet Take 1 tablet (40 mg total) by mouth daily. Patient taking differently: Take 40 mg by mouth at bedtime.  02/26/16   Amin, Jeanella Flattery, MD  sodium chloride (OCEAN) 0.65 % SOLN nasal spray Place 1 spray into both nostrils 2 (two) times daily as needed (dryness).    [provider]      Allergies    Tape    Review of Systems   Review of Systems  Physical Exam Updated Vital Signs BP (!) 173/102   Pulse 85   Temp 98.4 F (36.9 C) (Oral)   Resp 18   SpO2 98%  Physical Exam Vitals and nursing note reviewed.  Constitutional:      General: He is not in acute distress.    Appearance: He is well-developed.  HENT:     Head: Normocephalic and atraumatic.     Nose: Nose normal.      Mouth/Throat:     Mouth: Mucous membranes are moist.  Eyes:     Extraocular Movements: Extraocular movements intact.     Conjunctiva/sclera: Conjunctivae normal.     Pupils: Pupils are equal, round, and reactive to light.  Cardiovascular:     Rate and Rhythm: Normal rate and regular rhythm.     Pulses: Normal pulses.     Heart sounds: Normal heart sounds. No murmur heard. Pulmonary:     Effort: Pulmonary effort is normal. No respiratory distress.     Breath sounds: Normal breath sounds.  Abdominal:     Palpations: Abdomen is soft.     Tenderness: There is no abdominal tenderness.  Musculoskeletal:        General: No swelling.     Cervical back: Neck supple.  Skin:    General: Skin is warm and dry.     Capillary Refill: Capillary refill takes less than 2 seconds.  Neurological:     General: No focal deficit present.     Mental Status: He is alert and oriented to person, place, and time.     Cranial Nerves: No cranial nerve deficit.     Sensory: No sensory deficit.     Motor: No weakness.     Coordination: Coordination normal.     Comments: 5+ out of 5 strength throughout, normal sensation, no drift, normal speech  Psychiatric:        Mood and Affect: Mood normal.     ED Results / Procedures / Treatments   Labs (all labs ordered are listed, but only abnormal results are displayed) Labs Reviewed  CBC WITH DIFFERENTIAL/PLATELET - Abnormal; Notable for the following components:      Result Value   RDW 16.4 (*)    All other components within normal limits  COMPREHENSIVE METABOLIC PANEL - Abnormal; Notable for the following components:   Glucose, Bld 109 (*)    Calcium 8.4 (*)    Total Protein 6.4 (*)    Albumin 3.4 (*)    All other components within normal limits  LIPASE, BLOOD  URINALYSIS, ROUTINE W REFLEX MICROSCOPIC    EKG EKG Interpretation  Date/Time:  Saturday December 13 2021 20:10:10 EDT Ventricular Rate:  83 PR Interval:  226 QRS Duration: 91 QT  Interval:  386 QTC Calculation: 454 R Axis:   -55 Text Interpretation: Sinus rhythm Prolonged PR interval Left anterior fascicular block Confirmed by Lennice Sites ([id number]) on 12/13/2021 8:11:28 PM  Radiology CT Head Wo Contrast  Result Date: 12/13/2021 CLINICAL DATA:  Headache EXAM: CT HEAD WITHOUT CONTRAST TECHNIQUE: Contiguous axial images were obtained from the base of the skull through the vertex without intravenous contrast. RADIATION DOSE REDUCTION: This exam was  performed according to the departmental dose-optimization program which includes automated exposure control, adjustment of the mA and/or kV according to patient size and/or use of iterative reconstruction technique. COMPARISON:  09/30/2021 FINDINGS: Brain: There is atrophy and chronic small vessel disease changes. No acute intracranial abnormality. Specifically, no hemorrhage, hydrocephalus, mass lesion, acute infarction, or significant intracranial injury. Vascular: No hyperdense vessel or unexpected calcification. Skull: No acute calvarial abnormality. Sinuses/Orbits: No acute findings Other: None IMPRESSION: Atrophy, chronic microvascular disease. No acute intracranial abnormality. Electronically Signed   By: Rolm Baptise M.D.   On: 12/13/2021 21:16    Procedures Procedures    Medications Ordered in ED Medications  sodium chloride 0.9 % bolus 1,000 mL (1,000 mLs Intravenous New Bag/Given 12/13/21 2020)    ED Course/ Medical Decision Making/ A&P                           Medical Decision Making Amount and/or Complexity of Data Reviewed Labs: ordered. Radiology: ordered.   [place] is here with diarrhea, breakthrough seizure.  Normal vitals.  No fever.  He has had diarrhea the last several days.  No recent antibiotics.  Had a brief seizure-like episode earlier today.  Concern for may be dehydration causing this issue per EMS.  Patient back to his baseline.  He has been compliant with his medications.  He takes  Depakote.  He denies any abdominal pain.  No nausea or vomiting.  He is well-appearing.  Differential diagnosis is likely seizure breakthrough in the setting of illness.  Suspect viral process.  Concern for possibly dehydration or AKI.  Will check CBC, CMP, lipase and give IV fluid bolus.  EKG shows sinus rhythm.  No ischemic changes.  Have no concern for stroke or ongoing seizure at this time.  Per my review and interpretation of labs is no significant anemia, electrolyte abnormality, kidney injury.  CT scan of the head is unremarkable per my review interpretation.  Overall suspect breakthrough seizure likely in the setting of what I suspect is viral illness.  Lab work is reassuring.  Clinically he appears very well.  He has been hydrated with IV fluids.  Discharged in good condition.  Understands return precautions.  This chart was dictated using voice recognition software.  Despite best efforts to proofread,  errors can occur which can change the documentation meaning.         Final Clinical Impression(s) / ED Diagnoses Final diagnoses:  Diarrhea, unspecified type  Seizure-like activity [facility])    Rx / DC Orders ED Discharge Orders     None         Lennice Sites, DO 12/13/21 2134

## 2021-12-13 NOTE — ED Triage Notes (Addendum)
Pt BIBA from [place] at [facility]- c/o diarrhea x3-4 days. EMS reports family witnessed focal seizure appx 45 min prior to EMS arrival.  Denies pain, denies weakness. Denies n/v. Takes ASA.  Hx of stroke. Cognitively at baseline.

## 2021-12-18 NOTE — Telephone Encounter (Signed)
Patient's wife called back states that he is having loose stools again and is seeking advise

## 2021-12-19 ENCOUNTER — Emergency Department (HOSPITAL_BASED_OUTPATIENT_CLINIC_OR_DEPARTMENT_OTHER)
Admission: EM | Admit: 2021-12-19 | Discharge: 2021-12-19 | Disposition: A | Discharge status: Home / Self Care | Payer: Medicare Other | Attending: Emergency Medicine | Admitting: Emergency Medicine

## 2021-12-19 ENCOUNTER — Encounter (HOSPITAL_BASED_OUTPATIENT_CLINIC_OR_DEPARTMENT_OTHER): Payer: Self-pay | Admitting: Urology

## 2021-12-19 ENCOUNTER — Other Ambulatory Visit: Payer: Self-pay

## 2021-12-19 DIAGNOSIS — I10 Essential (primary) hypertension: Secondary | ICD-10-CM | POA: Diagnosis not present

## 2021-12-19 DIAGNOSIS — R197 Diarrhea, unspecified: Secondary | ICD-10-CM | POA: Diagnosis not present

## 2021-12-19 DIAGNOSIS — E119 Type 2 diabetes mellitus without complications: Secondary | ICD-10-CM | POA: Insufficient documentation

## 2021-12-19 DIAGNOSIS — Z7982 Long term (current) use of aspirin: Secondary | ICD-10-CM | POA: Insufficient documentation

## 2021-12-19 DIAGNOSIS — Z79899 Other long term (current) drug therapy: Secondary | ICD-10-CM | POA: Diagnosis not present

## 2021-12-19 DIAGNOSIS — Z7984 Long term (current) use of oral hypoglycemic drugs: Secondary | ICD-10-CM | POA: Diagnosis not present

## 2021-12-19 LAB — CBC WITH DIFFERENTIAL/PLATELET
Abs Immature Granulocytes: 0.02 10*3/uL (ref 0.00–0.07)
Basophils Absolute: 0 10*3/uL (ref 0.0–0.1)
Basophils Relative: 1 %
Eosinophils Absolute: 0.3 10*3/uL (ref 0.0–0.5)
Eosinophils Relative: 4 %
HCT: 45.7 % (ref 39.0–52.0)
Hemoglobin: 14.5 g/dL (ref 13.0–17.0)
Immature Granulocytes: 0 %
Lymphocytes Relative: 15 %
Lymphs Abs: 1 10*3/uL (ref 0.7–4.0)
MCH: 30.7 pg (ref 26.0–34.0)
MCHC: 31.7 g/dL (ref 30.0–36.0)
MCV: 96.6 fL (ref 80.0–100.0)
Monocytes Absolute: 0.8 10*3/uL (ref 0.1–1.0)
Monocytes Relative: 12 %
Neutro Abs: 4.5 10*3/uL (ref 1.7–7.7)
Neutrophils Relative %: 68 %
Platelets: 296 10*3/uL (ref 150–400)
RBC: 4.73 MIL/uL (ref 4.22–5.81)
RDW: 16.4 % — ABNORMAL HIGH (ref 11.5–15.5)
WBC: 6.6 10*3/uL (ref 4.0–10.5)
nRBC: 0 % (ref 0.0–0.2)

## 2021-12-19 LAB — COMPREHENSIVE METABOLIC PANEL
ALT: 12 U/L (ref 0–44)
AST: 20 U/L (ref 15–41)
Albumin: 2.9 g/dL — ABNORMAL LOW (ref 3.5–5.0)
Alkaline Phosphatase: 52 U/L (ref 38–126)
Anion gap: 5 (ref 5–15)
BUN: 12 mg/dL (ref 8–23)
CO2: 29 mmol/L (ref 22–32)
Calcium: 8.1 mg/dL — ABNORMAL LOW (ref 8.9–10.3)
Chloride: 100 mmol/L (ref 98–111)
Creatinine, Ser: 0.76 mg/dL (ref 0.61–1.24)
GFR, Estimated: >60 mL/min (ref >60)
Glucose, Bld: 145 mg/dL — ABNORMAL HIGH (ref 70–99)
Potassium: 4 mmol/L (ref 3.5–5.1)
Sodium: 134 mmol/L — ABNORMAL LOW (ref 135–145)
Total Bilirubin: 0.5 mg/dL (ref 0.3–1.2)
Total Protein: 6.2 g/dL — ABNORMAL LOW (ref 6.5–8.1)

## 2021-12-19 LAB — URINALYSIS, ROUTINE W REFLEX MICROSCOPIC
Bilirubin Urine: NEGATIVE
Glucose, UA: NEGATIVE mg/dL
Hgb urine dipstick: NEGATIVE
Ketones, ur: NEGATIVE mg/dL
Leukocytes,Ua: NEGATIVE
Nitrite: NEGATIVE
Protein, ur: NEGATIVE mg/dL
Specific Gravity, Urine: 1.02 (ref 1.005–1.030)
pH: 7 (ref 5.0–8.0)

## 2021-12-19 LAB — C DIFFICILE QUICK SCREEN W PCR REFLEX
C Diff antigen: NEGATIVE
C Diff interpretation: NOT DETECTED
C Diff toxin: NEGATIVE

## 2021-12-19 MED ORDER — LACTATED RINGERS IV BOLUS
1000.0000 mL | Freq: Once | INTRAVENOUS | Status: AC
  Administered 2021-12-19: 1000 mL via INTRAVENOUS

## 2021-12-19 NOTE — ED Triage Notes (Signed)
Pt here from [facility]  Pt states diarrhea x 1 week, also reports weakness  Was here on 10/28  for same  Been taking electrolytes and immodium with little relief   Not constant diarrhea, 1-2 loose stools per day

## 2021-12-19 NOTE — Telephone Encounter (Signed)
The pt's wife is calling to report that the pt is continuing to have watery diarrhea despite imodium. He does not have a fever that she is aware of, he has some abd cramps. He is still on a BRAT diet. She has been trying to push fluids.  She says he is getting very weak and not doing well. I have advised her that he should be seen in the ED per Dr Doyne Keel last response.  She has agrees and will go now.

## 2021-12-19 NOTE — ED Notes (Signed)
Patient has been on brat diet. States that when he eats regular food the diarrhea starts again.diarrhea x2 in 24 hours. States that he is taking imodium for relief

## 2021-12-19 NOTE — Discharge Instructions (Signed)
You were seen in the emergency department for your diarrhea.  Your work-up showed no signs of infection or severe dehydration and no signs of anemia.  He did send off stool studies today to look for infectious causes of diarrhea and these will take 1 to 2 days to come back.  If anything is abnormal you should receive a call.  You can continue taking your probiotics and slowly introduce foods with fiber and protein to help bulk your stools.  You should follow-up with your primary doctor in the next few days to have your symptoms rechecked.  You should return to the emergency department if your weakness is getting significantly worse, you pass out, you have abdominal pain, you have fevers or if you have any other new or concerning symptoms.

## 2021-12-19 NOTE — ED Provider Notes (Signed)
[facility] EMERGENCY DEPARTMENT Provider Note   CSN: [id number] Arrival date & time: 12/19/21  1123     History  Chief Complaint  Patient presents with   Diarrhea    [name] is a 88 y.o. male.  Patient is an 88 year old male with a past medical history of CVA with left-sided deficits, hypertension, diabetes, GERD, diverticulosis resenting to the emergency department with diarrhea.  Patient started to have loose bowel movements last week. He reports having 3-4 episodes of diarrhea per day that have become more watery. He states he has been feeling more weak than usual.  He states that he is drinking electrolyte drinks and has been following a brat diet without any improvement so he came to the emergency department to be reassessed.  He denies any black or bloody stools, chest pain or shortness of breath.  He states that he is having mild lower abdominal cramping.  He denies any recent antibiotic use, recent hospitalizations, travel or known sick contacts.  The history is provided by the patient, the spouse and a caregiver.  Diarrhea      Home Medications Prior to Admission medications   Medication Sig Start Date End Date Taking? Authorizing Provider  acetaminophen (TYLENOL) 325 MG tablet Take 2 tablets (650 mg total) by mouth every 6 (six) hours as needed for mild pain, fever or headache. Patient taking differently: Take 650 mg by mouth 2 (two) times daily. 03/12/21 03/12/22  Elgergawy, Silver Huguenin, MD  albuterol (VENTOLIN HFA) 108 (90 Base) MCG/ACT inhaler Inhale 2 puffs into the lungs every 6 (six) hours as needed for wheezing or shortness of breath.    [provider]  aspirin EC 81 MG tablet Take 81 mg by mouth daily. Swallow whole.    [provider]  budesonide (PULMICORT) 0.5 MG/2ML nebulizer solution Take 2 mLs by nebulization See admin instructions. Inhale 2 ml via nebulizer daily. May repeat once if needed. 09/01/18   [provider]   carvedilol (COREG) 3.125 MG tablet Take 1 tablet (3.125 mg total) by mouth 2 (two) times daily with a meal. 03/12/21   Elgergawy, Silver Huguenin, MD  Cholecalciferol (VITAMIN D-3 PO) Take 1 tablet by mouth in the morning.    [provider]  divalproex (DEPAKOTE SPRINKLE) 125 MG capsule Take 2 capsules (250 mg total) by mouth every morning AND 4 capsules (500 mg total) at bedtime. Taking 2 capsules ( 250 mg) in the AM and 4 capsules ( 500 mg) in the evening. 11/03/21   Frann Rider, NP  ferrous sulfate 325 (65 FE) MG tablet Take 1 tablet (325 mg total) by mouth 2 (two) times daily. 08/04/21   Regalado, Belkys A, MD  HYDROcodone-acetaminophen (NORCO/VICODIN) 5-325 MG tablet Take 1 tablet by mouth daily as needed. 10/09/21   [provider]  MELATONIN PO Take 1 tablet by mouth daily.    [provider]  metFORMIN (GLUCOPHAGE) 500 MG tablet Take 500 mg by mouth daily. 08/23/19   [provider]  pantoprazole (PROTONIX) 40 MG tablet Take 1 tablet (40 mg total) by mouth daily with breakfast. 02/04/21   Esterwood, Amy S, PA-C  polyethylene glycol (MIRALAX / GLYCOLAX) 17 g packet Take 17 g by mouth daily.    [provider]  predniSONE (DELTASONE) 10 MG tablet Take 5 mg by mouth daily.  07/11/19   [provider]  psyllium (METAMUCIL) 58.6 % packet Take 1 packet by mouth daily.    [provider]  rOPINIRole (REQUIP) 4 MG tablet Take 1 tablet (4 mg total) by mouth at bedtime. 11/03/21   Frann Rider, NP  simvastatin (ZOCOR) 40 MG tablet Take 1 tablet (40 mg total) by mouth daily. Patient taking differently: Take 40 mg by mouth at bedtime. 02/26/16   Amin, Jeanella Flattery, MD  sodium chloride (OCEAN) 0.65 % SOLN nasal spray Place 1 spray into both nostrils 2 (two) times daily as needed (dryness).    [provider]      Allergies    Tape    Review of Systems   Review of Systems  Gastrointestinal:  Positive for diarrhea.    Physical  Exam Updated Vital Signs BP (!) 111/99   Pulse 65   Temp (!) 97.3 F (36.3 C)   Resp 17   Ht '5\' 10"'$  (1.778 m)   Wt 67.1 kg   SpO2 98%   BMI 21.23 kg/m  Physical Exam Vitals and nursing note reviewed.  Constitutional:      General: He is not in acute distress.    Appearance: Normal appearance.  HENT:     Head: Normocephalic and atraumatic.     Nose: Nose normal.     Mouth/Throat:     Pharynx: Oropharynx is clear.     Comments: Mildly dry mucous membranes Eyes:     Extraocular Movements: Extraocular movements intact.     Conjunctiva/sclera: Conjunctivae normal.  Cardiovascular:     Rate and Rhythm: Normal rate and regular rhythm.     Pulses: Normal pulses.     Heart sounds: Normal heart sounds.  Pulmonary:     Effort: Pulmonary effort is normal.     Breath sounds: Normal breath sounds.  Abdominal:     General: Abdomen is flat.     Palpations: Abdomen is soft.     Tenderness: There is no abdominal tenderness. There is no right CVA tenderness or left CVA tenderness.  Musculoskeletal:        General: Normal range of motion.     Cervical back: Normal range of motion and neck supple.     Right lower leg: No edema.     Left lower leg: No edema.  Skin:    General: Skin is warm and dry.  Neurological:     Mental Status: He is alert and oriented to person, place, and time. Mental status is at baseline.  Psychiatric:        Mood and Affect: Mood normal.     ED Results / Procedures / Treatments   Labs (all labs ordered are listed, but only abnormal results are displayed) Labs Reviewed  COMPREHENSIVE METABOLIC PANEL - Abnormal; Notable for the following components:      Result Value   Sodium 134 (*)    Glucose, Bld 145 (*)    Calcium 8.1 (*)    Total Protein 6.2 (*)    Albumin 2.9 (*)    All other components within normal limits  CBC WITH DIFFERENTIAL/PLATELET - Abnormal; Notable for the following components:   RDW 16.4 (*)    All other components within normal  limits  GASTROINTESTINAL PANEL BY PCR, STOOL (REPLACES STOOL CULTURE)  C DIFFICILE QUICK SCREEN W PCR REFLEX    URINALYSIS, ROUTINE W REFLEX MICROSCOPIC    EKG None  Radiology No results found.  Procedures Procedures    Medications Ordered in ED Medications  lactated ringers bolus 1,000 mL (0 mLs Intravenous Stopped 12/19/21 1501)    ED Course/ Medical Decision Making/ A&P  Medical Decision Making This patient presents to the ED with chief complaint(s) of diarrhea with pertinent past medical history of CVA with right-sided deficits, diverticulosis, hypertension, diabetes, GERD which further complicates the presenting complaint. The complaint involves an extensive differential diagnosis and also carries with it a high risk of complications and morbidity.    The differential diagnosis includes dehydration, infectious diarrhea, GI bleed, electrolyte abnormality, UTI, intra-abdominal infection less likely as no fevers and no point abdominal tenderness  Additional history obtained: Additional history obtained from family Records reviewed Primary Care Documents  ED Course and Reassessment:   Independent labs interpretation:  The following labs were independently interpreted: Within normal range  Independent visualization of imaging: N/A  Consultation: - Consulted or discussed management/test interpretation w/ external professional: N/A  Consideration for admission or further workup: Patient has no emergent conditions requiring admission or further work-up at this time and is stable for discharge with primary care follow-up.  He was informed that he would be called if any of his stool study results are abnormal. Social Determinants of health: N/A    Amount and/or Complexity of Data Reviewed Labs: ordered.          Final Clinical Impression(s) / ED Diagnoses Final diagnoses:  Diarrhea, unspecified type    Rx / DC Orders ED Discharge  Orders     None         Kemper Durie, DO 12/19/21 1618

## 2021-12-22 ENCOUNTER — Telehealth: Payer: Self-pay | Admitting: Adult Health

## 2021-12-22 MED ORDER — DIVALPROEX SODIUM 125 MG PO CSDR: DELAYED_RELEASE_CAPSULE | ORAL | 11 refills | Status: AC

## 2021-12-22 NOTE — Telephone Encounter (Signed)
Pt is calling. Stated pt need a refill on medication divalproex (DEPAKOTE SPRINKLE) 125 MG capsule. Refill should be sent to [place].

## 2021-12-22 NOTE — Telephone Encounter (Signed)
Contacted pt back, spoke to wife. Informed her yr supply was given 11/03/21 but she stated pharmacy didn't have it. Contacted pharmacy to verify and they stated the same. I have resent order.

## 2021-12-23 ENCOUNTER — Telehealth (HOSPITAL_BASED_OUTPATIENT_CLINIC_OR_DEPARTMENT_OTHER): Payer: Self-pay

## 2021-12-23 LAB — GASTROINTESTINAL PANEL BY PCR, STOOL (REPLACES STOOL CULTURE)

## 2021-12-23 NOTE — Telephone Encounter (Signed)
Received call from lab regarding positive Enteropathogenic E. Coli in GI Panel. Will notify provider for further recommendation.

## 2021-12-25 NOTE — Telephone Encounter (Signed)
Patients wife called to follow up and let you know that the patient is still having watery BM's but it is looking green now wondering if that is ok.

## 2021-12-26 NOTE — Telephone Encounter (Signed)
Left message on machine to call back  

## 2021-12-29 NOTE — Telephone Encounter (Signed)
Left message on machine to call back  

## 2021-12-30 NOTE — Telephone Encounter (Signed)
The pt wife states that the pt has began to have formed stools and is doing much better.  She will call if he does not continue to improve.

## 2022-01-01 DIAGNOSIS — I69354 Hemiplegia and hemiparesis following cerebral infarction affecting left non-dominant side: Secondary | ICD-10-CM | POA: Diagnosis not present

## 2022-01-01 DIAGNOSIS — R2681 Unsteadiness on feet: Secondary | ICD-10-CM | POA: Diagnosis not present

## 2022-01-01 DIAGNOSIS — M6281 Muscle weakness (generalized): Secondary | ICD-10-CM | POA: Diagnosis not present

## 2022-01-05 DIAGNOSIS — R197 Diarrhea, unspecified: Secondary | ICD-10-CM | POA: Diagnosis not present

## 2022-01-06 DIAGNOSIS — M6281 Muscle weakness (generalized): Secondary | ICD-10-CM | POA: Diagnosis not present

## 2022-01-06 DIAGNOSIS — R2681 Unsteadiness on feet: Secondary | ICD-10-CM | POA: Diagnosis not present

## 2022-01-06 DIAGNOSIS — I69354 Hemiplegia and hemiparesis following cerebral infarction affecting left non-dominant side: Secondary | ICD-10-CM | POA: Diagnosis not present

## 2022-01-07 DIAGNOSIS — E119 Type 2 diabetes mellitus without complications: Secondary | ICD-10-CM | POA: Diagnosis not present

## 2022-01-07 DIAGNOSIS — J4520 Mild intermittent asthma, uncomplicated: Secondary | ICD-10-CM | POA: Diagnosis not present

## 2022-01-07 DIAGNOSIS — I10 Essential (primary) hypertension: Secondary | ICD-10-CM | POA: Diagnosis not present

## 2022-01-07 DIAGNOSIS — G4700 Insomnia, unspecified: Secondary | ICD-10-CM | POA: Diagnosis not present

## 2022-01-07 DIAGNOSIS — I5042 Chronic combined systolic (congestive) and diastolic (congestive) heart failure: Secondary | ICD-10-CM | POA: Diagnosis not present

## 2022-01-07 DIAGNOSIS — E7800 Pure hypercholesterolemia, unspecified: Secondary | ICD-10-CM | POA: Diagnosis not present

## 2022-01-07 DIAGNOSIS — G8929 Other chronic pain: Secondary | ICD-10-CM | POA: Diagnosis not present

## 2022-01-14 DIAGNOSIS — R7989 Other specified abnormal findings of blood chemistry: Secondary | ICD-10-CM | POA: Diagnosis not present

## 2022-01-14 DIAGNOSIS — E559 Vitamin D deficiency, unspecified: Secondary | ICD-10-CM | POA: Diagnosis not present

## 2022-01-14 DIAGNOSIS — E119 Type 2 diabetes mellitus without complications: Secondary | ICD-10-CM | POA: Diagnosis not present

## 2022-01-14 DIAGNOSIS — U071 COVID-19: Secondary | ICD-10-CM | POA: Diagnosis not present

## 2022-01-15 DIAGNOSIS — I679 Cerebrovascular disease, unspecified: Secondary | ICD-10-CM | POA: Diagnosis not present

## 2022-01-15 DIAGNOSIS — R2689 Other abnormalities of gait and mobility: Secondary | ICD-10-CM | POA: Diagnosis not present

## 2022-01-15 DIAGNOSIS — E119 Type 2 diabetes mellitus without complications: Secondary | ICD-10-CM | POA: Diagnosis not present

## 2022-01-15 DIAGNOSIS — U071 COVID-19: Secondary | ICD-10-CM | POA: Diagnosis not present

## 2022-01-15 DIAGNOSIS — R531 Weakness: Secondary | ICD-10-CM | POA: Diagnosis not present

## 2022-01-15 DIAGNOSIS — I480 Paroxysmal atrial fibrillation: Secondary | ICD-10-CM | POA: Diagnosis not present

## 2022-01-15 DIAGNOSIS — Z7984 Long term (current) use of oral hypoglycemic drugs: Secondary | ICD-10-CM | POA: Diagnosis not present

## 2022-01-15 DIAGNOSIS — J4530 Mild persistent asthma, uncomplicated: Secondary | ICD-10-CM | POA: Diagnosis not present

## 2022-01-15 DIAGNOSIS — I5042 Chronic combined systolic (congestive) and diastolic (congestive) heart failure: Secondary | ICD-10-CM | POA: Diagnosis not present

## 2022-01-16 DIAGNOSIS — R1312 Dysphagia, oropharyngeal phase: Secondary | ICD-10-CM | POA: Diagnosis not present

## 2022-01-16 DIAGNOSIS — R278 Other lack of coordination: Secondary | ICD-10-CM | POA: Diagnosis not present

## 2022-01-16 DIAGNOSIS — M6281 Muscle weakness (generalized): Secondary | ICD-10-CM | POA: Diagnosis not present

## 2022-01-19 DIAGNOSIS — M6281 Muscle weakness (generalized): Secondary | ICD-10-CM | POA: Diagnosis not present

## 2022-01-19 DIAGNOSIS — R278 Other lack of coordination: Secondary | ICD-10-CM | POA: Diagnosis not present

## 2022-01-19 DIAGNOSIS — R1312 Dysphagia, oropharyngeal phase: Secondary | ICD-10-CM | POA: Diagnosis not present

## 2022-01-20 DIAGNOSIS — R1312 Dysphagia, oropharyngeal phase: Secondary | ICD-10-CM | POA: Diagnosis not present

## 2022-01-20 DIAGNOSIS — R278 Other lack of coordination: Secondary | ICD-10-CM | POA: Diagnosis not present

## 2022-01-20 DIAGNOSIS — M6281 Muscle weakness (generalized): Secondary | ICD-10-CM | POA: Diagnosis not present

## 2022-01-21 DIAGNOSIS — M6281 Muscle weakness (generalized): Secondary | ICD-10-CM | POA: Diagnosis not present

## 2022-01-21 DIAGNOSIS — R1312 Dysphagia, oropharyngeal phase: Secondary | ICD-10-CM | POA: Diagnosis not present

## 2022-01-21 DIAGNOSIS — R278 Other lack of coordination: Secondary | ICD-10-CM | POA: Diagnosis not present

## 2022-01-22 DIAGNOSIS — M6281 Muscle weakness (generalized): Secondary | ICD-10-CM | POA: Diagnosis not present

## 2022-01-22 DIAGNOSIS — R278 Other lack of coordination: Secondary | ICD-10-CM | POA: Diagnosis not present

## 2022-01-22 DIAGNOSIS — R1312 Dysphagia, oropharyngeal phase: Secondary | ICD-10-CM | POA: Diagnosis not present

## 2022-01-23 DIAGNOSIS — M6281 Muscle weakness (generalized): Secondary | ICD-10-CM | POA: Diagnosis not present

## 2022-01-23 DIAGNOSIS — R059 Cough, unspecified: Secondary | ICD-10-CM | POA: Diagnosis not present

## 2022-01-23 DIAGNOSIS — R1312 Dysphagia, oropharyngeal phase: Secondary | ICD-10-CM | POA: Diagnosis not present

## 2022-01-23 DIAGNOSIS — R278 Other lack of coordination: Secondary | ICD-10-CM | POA: Diagnosis not present

## 2022-01-26 DIAGNOSIS — R278 Other lack of coordination: Secondary | ICD-10-CM | POA: Diagnosis not present

## 2022-01-26 DIAGNOSIS — R1312 Dysphagia, oropharyngeal phase: Secondary | ICD-10-CM | POA: Diagnosis not present

## 2022-01-26 DIAGNOSIS — M6281 Muscle weakness (generalized): Secondary | ICD-10-CM | POA: Diagnosis not present

## 2022-01-27 DIAGNOSIS — R278 Other lack of coordination: Secondary | ICD-10-CM | POA: Diagnosis not present

## 2022-01-27 DIAGNOSIS — N390 Urinary tract infection, site not specified: Secondary | ICD-10-CM | POA: Diagnosis not present

## 2022-01-27 DIAGNOSIS — M6281 Muscle weakness (generalized): Secondary | ICD-10-CM | POA: Diagnosis not present

## 2022-01-27 DIAGNOSIS — E785 Hyperlipidemia, unspecified: Secondary | ICD-10-CM | POA: Diagnosis not present

## 2022-01-27 DIAGNOSIS — R1312 Dysphagia, oropharyngeal phase: Secondary | ICD-10-CM | POA: Diagnosis not present

## 2022-01-28 DIAGNOSIS — M6281 Muscle weakness (generalized): Secondary | ICD-10-CM | POA: Diagnosis not present

## 2022-01-28 DIAGNOSIS — R1312 Dysphagia, oropharyngeal phase: Secondary | ICD-10-CM | POA: Diagnosis not present

## 2022-01-28 DIAGNOSIS — R278 Other lack of coordination: Secondary | ICD-10-CM | POA: Diagnosis not present

## 2022-01-29 DIAGNOSIS — R1312 Dysphagia, oropharyngeal phase: Secondary | ICD-10-CM | POA: Diagnosis not present

## 2022-01-29 DIAGNOSIS — R278 Other lack of coordination: Secondary | ICD-10-CM | POA: Diagnosis not present

## 2022-01-29 DIAGNOSIS — M6281 Muscle weakness (generalized): Secondary | ICD-10-CM | POA: Diagnosis not present

## 2022-01-30 DIAGNOSIS — M6281 Muscle weakness (generalized): Secondary | ICD-10-CM | POA: Diagnosis not present

## 2022-01-30 DIAGNOSIS — R278 Other lack of coordination: Secondary | ICD-10-CM | POA: Diagnosis not present

## 2022-01-30 DIAGNOSIS — R1312 Dysphagia, oropharyngeal phase: Secondary | ICD-10-CM | POA: Diagnosis not present

## 2022-02-02 DIAGNOSIS — R278 Other lack of coordination: Secondary | ICD-10-CM | POA: Diagnosis not present

## 2022-02-02 DIAGNOSIS — M6281 Muscle weakness (generalized): Secondary | ICD-10-CM | POA: Diagnosis not present

## 2022-02-02 DIAGNOSIS — R1312 Dysphagia, oropharyngeal phase: Secondary | ICD-10-CM | POA: Diagnosis not present

## 2022-02-03 DIAGNOSIS — R1312 Dysphagia, oropharyngeal phase: Secondary | ICD-10-CM | POA: Diagnosis not present

## 2022-02-03 DIAGNOSIS — H6123 Impacted cerumen, bilateral: Secondary | ICD-10-CM | POA: Diagnosis not present

## 2022-02-03 DIAGNOSIS — M6281 Muscle weakness (generalized): Secondary | ICD-10-CM | POA: Diagnosis not present

## 2022-02-03 DIAGNOSIS — R278 Other lack of coordination: Secondary | ICD-10-CM | POA: Diagnosis not present

## 2022-02-04 DIAGNOSIS — M6281 Muscle weakness (generalized): Secondary | ICD-10-CM | POA: Diagnosis not present

## 2022-02-04 DIAGNOSIS — R1312 Dysphagia, oropharyngeal phase: Secondary | ICD-10-CM | POA: Diagnosis not present

## 2022-02-04 DIAGNOSIS — R278 Other lack of coordination: Secondary | ICD-10-CM | POA: Diagnosis not present

## 2022-02-05 DIAGNOSIS — R1312 Dysphagia, oropharyngeal phase: Secondary | ICD-10-CM | POA: Diagnosis not present

## 2022-02-05 DIAGNOSIS — M6281 Muscle weakness (generalized): Secondary | ICD-10-CM | POA: Diagnosis not present

## 2022-02-05 DIAGNOSIS — R278 Other lack of coordination: Secondary | ICD-10-CM | POA: Diagnosis not present

## 2022-02-06 DIAGNOSIS — R278 Other lack of coordination: Secondary | ICD-10-CM | POA: Diagnosis not present

## 2022-02-06 DIAGNOSIS — R1312 Dysphagia, oropharyngeal phase: Secondary | ICD-10-CM | POA: Diagnosis not present

## 2022-02-06 DIAGNOSIS — M6281 Muscle weakness (generalized): Secondary | ICD-10-CM | POA: Diagnosis not present

## 2022-02-09 DIAGNOSIS — R1312 Dysphagia, oropharyngeal phase: Secondary | ICD-10-CM | POA: Diagnosis not present

## 2022-02-09 DIAGNOSIS — R278 Other lack of coordination: Secondary | ICD-10-CM | POA: Diagnosis not present

## 2022-02-09 DIAGNOSIS — M6281 Muscle weakness (generalized): Secondary | ICD-10-CM | POA: Diagnosis not present

## 2022-02-10 DIAGNOSIS — R278 Other lack of coordination: Secondary | ICD-10-CM | POA: Diagnosis not present

## 2022-02-10 DIAGNOSIS — R1312 Dysphagia, oropharyngeal phase: Secondary | ICD-10-CM | POA: Diagnosis not present

## 2022-02-10 DIAGNOSIS — M6281 Muscle weakness (generalized): Secondary | ICD-10-CM | POA: Diagnosis not present

## 2022-02-11 DIAGNOSIS — R1312 Dysphagia, oropharyngeal phase: Secondary | ICD-10-CM | POA: Diagnosis not present

## 2022-02-11 DIAGNOSIS — R278 Other lack of coordination: Secondary | ICD-10-CM | POA: Diagnosis not present

## 2022-02-11 DIAGNOSIS — M6281 Muscle weakness (generalized): Secondary | ICD-10-CM | POA: Diagnosis not present

## 2022-02-12 DIAGNOSIS — M6281 Muscle weakness (generalized): Secondary | ICD-10-CM | POA: Diagnosis not present

## 2022-02-12 DIAGNOSIS — R278 Other lack of coordination: Secondary | ICD-10-CM | POA: Diagnosis not present

## 2022-02-12 DIAGNOSIS — R1312 Dysphagia, oropharyngeal phase: Secondary | ICD-10-CM | POA: Diagnosis not present

## 2022-02-13 DIAGNOSIS — I69811 Memory deficit following other cerebrovascular disease: Secondary | ICD-10-CM | POA: Diagnosis not present

## 2022-02-13 DIAGNOSIS — M6281 Muscle weakness (generalized): Secondary | ICD-10-CM | POA: Diagnosis not present

## 2022-02-13 DIAGNOSIS — F01C0 Vascular dementia, severe, without behavioral disturbance, psychotic disturbance, mood disturbance, and anxiety: Secondary | ICD-10-CM | POA: Diagnosis not present

## 2022-02-13 DIAGNOSIS — R1312 Dysphagia, oropharyngeal phase: Secondary | ICD-10-CM | POA: Diagnosis not present

## 2022-02-13 DIAGNOSIS — R278 Other lack of coordination: Secondary | ICD-10-CM | POA: Diagnosis not present

## 2022-02-14 DIAGNOSIS — R278 Other lack of coordination: Secondary | ICD-10-CM | POA: Diagnosis not present

## 2022-02-14 DIAGNOSIS — R1312 Dysphagia, oropharyngeal phase: Secondary | ICD-10-CM | POA: Diagnosis not present

## 2022-02-14 DIAGNOSIS — M6281 Muscle weakness (generalized): Secondary | ICD-10-CM | POA: Diagnosis not present

## 2022-02-15 DIAGNOSIS — N390 Urinary tract infection, site not specified: Secondary | ICD-10-CM | POA: Diagnosis not present

## 2022-02-16 DIAGNOSIS — R278 Other lack of coordination: Secondary | ICD-10-CM | POA: Diagnosis not present

## 2022-02-16 DIAGNOSIS — M6281 Muscle weakness (generalized): Secondary | ICD-10-CM | POA: Diagnosis not present

## 2022-02-16 DIAGNOSIS — R1312 Dysphagia, oropharyngeal phase: Secondary | ICD-10-CM | POA: Diagnosis not present

## 2022-02-18 DIAGNOSIS — R1312 Dysphagia, oropharyngeal phase: Secondary | ICD-10-CM | POA: Diagnosis not present

## 2022-02-18 DIAGNOSIS — R278 Other lack of coordination: Secondary | ICD-10-CM | POA: Diagnosis not present

## 2022-02-18 DIAGNOSIS — M6281 Muscle weakness (generalized): Secondary | ICD-10-CM | POA: Diagnosis not present

## 2022-02-19 DIAGNOSIS — R1312 Dysphagia, oropharyngeal phase: Secondary | ICD-10-CM | POA: Diagnosis not present

## 2022-02-19 DIAGNOSIS — R278 Other lack of coordination: Secondary | ICD-10-CM | POA: Diagnosis not present

## 2022-02-19 DIAGNOSIS — M6281 Muscle weakness (generalized): Secondary | ICD-10-CM | POA: Diagnosis not present

## 2022-02-20 DIAGNOSIS — R1312 Dysphagia, oropharyngeal phase: Secondary | ICD-10-CM | POA: Diagnosis not present

## 2022-02-20 DIAGNOSIS — R278 Other lack of coordination: Secondary | ICD-10-CM | POA: Diagnosis not present

## 2022-02-20 DIAGNOSIS — M6281 Muscle weakness (generalized): Secondary | ICD-10-CM | POA: Diagnosis not present

## 2022-02-23 DIAGNOSIS — R1312 Dysphagia, oropharyngeal phase: Secondary | ICD-10-CM | POA: Diagnosis not present

## 2022-02-23 DIAGNOSIS — M6281 Muscle weakness (generalized): Secondary | ICD-10-CM | POA: Diagnosis not present

## 2022-02-23 DIAGNOSIS — R278 Other lack of coordination: Secondary | ICD-10-CM | POA: Diagnosis not present

## 2022-02-24 DIAGNOSIS — R278 Other lack of coordination: Secondary | ICD-10-CM | POA: Diagnosis not present

## 2022-02-24 DIAGNOSIS — R1312 Dysphagia, oropharyngeal phase: Secondary | ICD-10-CM | POA: Diagnosis not present

## 2022-02-24 DIAGNOSIS — M6281 Muscle weakness (generalized): Secondary | ICD-10-CM | POA: Diagnosis not present

## 2022-02-25 DIAGNOSIS — R1312 Dysphagia, oropharyngeal phase: Secondary | ICD-10-CM | POA: Diagnosis not present

## 2022-02-25 DIAGNOSIS — R278 Other lack of coordination: Secondary | ICD-10-CM | POA: Diagnosis not present

## 2022-02-25 DIAGNOSIS — H6123 Impacted cerumen, bilateral: Secondary | ICD-10-CM | POA: Diagnosis not present

## 2022-02-25 DIAGNOSIS — M6281 Muscle weakness (generalized): Secondary | ICD-10-CM | POA: Diagnosis not present

## 2022-02-26 DIAGNOSIS — M6281 Muscle weakness (generalized): Secondary | ICD-10-CM | POA: Diagnosis not present

## 2022-02-26 DIAGNOSIS — R1312 Dysphagia, oropharyngeal phase: Secondary | ICD-10-CM | POA: Diagnosis not present

## 2022-02-26 DIAGNOSIS — R278 Other lack of coordination: Secondary | ICD-10-CM | POA: Diagnosis not present

## 2022-02-27 DIAGNOSIS — M6281 Muscle weakness (generalized): Secondary | ICD-10-CM | POA: Diagnosis not present

## 2022-02-27 DIAGNOSIS — R1312 Dysphagia, oropharyngeal phase: Secondary | ICD-10-CM | POA: Diagnosis not present

## 2022-02-27 DIAGNOSIS — R278 Other lack of coordination: Secondary | ICD-10-CM | POA: Diagnosis not present

## 2022-03-02 DIAGNOSIS — R278 Other lack of coordination: Secondary | ICD-10-CM | POA: Diagnosis not present

## 2022-03-02 DIAGNOSIS — R1312 Dysphagia, oropharyngeal phase: Secondary | ICD-10-CM | POA: Diagnosis not present

## 2022-03-02 DIAGNOSIS — M6281 Muscle weakness (generalized): Secondary | ICD-10-CM | POA: Diagnosis not present

## 2022-03-03 DIAGNOSIS — R1312 Dysphagia, oropharyngeal phase: Secondary | ICD-10-CM | POA: Diagnosis not present

## 2022-03-03 DIAGNOSIS — M6281 Muscle weakness (generalized): Secondary | ICD-10-CM | POA: Diagnosis not present

## 2022-03-03 DIAGNOSIS — R278 Other lack of coordination: Secondary | ICD-10-CM | POA: Diagnosis not present

## 2022-03-04 DIAGNOSIS — M6281 Muscle weakness (generalized): Secondary | ICD-10-CM | POA: Diagnosis not present

## 2022-03-04 DIAGNOSIS — R278 Other lack of coordination: Secondary | ICD-10-CM | POA: Diagnosis not present

## 2022-03-04 DIAGNOSIS — R1312 Dysphagia, oropharyngeal phase: Secondary | ICD-10-CM | POA: Diagnosis not present

## 2022-03-06 DIAGNOSIS — R1312 Dysphagia, oropharyngeal phase: Secondary | ICD-10-CM | POA: Diagnosis not present

## 2022-03-06 DIAGNOSIS — M25472 Effusion, left ankle: Secondary | ICD-10-CM | POA: Diagnosis not present

## 2022-03-06 DIAGNOSIS — M79605 Pain in left leg: Secondary | ICD-10-CM | POA: Diagnosis not present

## 2022-03-06 DIAGNOSIS — R278 Other lack of coordination: Secondary | ICD-10-CM | POA: Diagnosis not present

## 2022-03-06 DIAGNOSIS — M6281 Muscle weakness (generalized): Secondary | ICD-10-CM | POA: Diagnosis not present

## 2022-03-07 DIAGNOSIS — M6281 Muscle weakness (generalized): Secondary | ICD-10-CM | POA: Diagnosis not present

## 2022-03-07 DIAGNOSIS — R1312 Dysphagia, oropharyngeal phase: Secondary | ICD-10-CM | POA: Diagnosis not present

## 2022-03-07 DIAGNOSIS — R278 Other lack of coordination: Secondary | ICD-10-CM | POA: Diagnosis not present

## 2022-03-09 DIAGNOSIS — R1312 Dysphagia, oropharyngeal phase: Secondary | ICD-10-CM | POA: Diagnosis not present

## 2022-03-09 DIAGNOSIS — R278 Other lack of coordination: Secondary | ICD-10-CM | POA: Diagnosis not present

## 2022-03-09 DIAGNOSIS — M6281 Muscle weakness (generalized): Secondary | ICD-10-CM | POA: Diagnosis not present

## 2022-03-10 DIAGNOSIS — R531 Weakness: Secondary | ICD-10-CM | POA: Diagnosis not present

## 2022-03-10 DIAGNOSIS — R1312 Dysphagia, oropharyngeal phase: Secondary | ICD-10-CM | POA: Diagnosis not present

## 2022-03-10 DIAGNOSIS — L603 Nail dystrophy: Secondary | ICD-10-CM | POA: Diagnosis not present

## 2022-03-10 DIAGNOSIS — R278 Other lack of coordination: Secondary | ICD-10-CM | POA: Diagnosis not present

## 2022-03-10 DIAGNOSIS — B351 Tinea unguium: Secondary | ICD-10-CM | POA: Diagnosis not present

## 2022-03-10 DIAGNOSIS — I5042 Chronic combined systolic (congestive) and diastolic (congestive) heart failure: Secondary | ICD-10-CM | POA: Diagnosis not present

## 2022-03-10 DIAGNOSIS — H938X3 Other specified disorders of ear, bilateral: Secondary | ICD-10-CM | POA: Diagnosis not present

## 2022-03-10 DIAGNOSIS — M6281 Muscle weakness (generalized): Secondary | ICD-10-CM | POA: Diagnosis not present

## 2022-03-10 DIAGNOSIS — R2689 Other abnormalities of gait and mobility: Secondary | ICD-10-CM | POA: Diagnosis not present

## 2022-03-10 DIAGNOSIS — L600 Ingrowing nail: Secondary | ICD-10-CM | POA: Diagnosis not present

## 2022-03-10 DIAGNOSIS — H6123 Impacted cerumen, bilateral: Secondary | ICD-10-CM | POA: Diagnosis not present

## 2022-03-10 DIAGNOSIS — I679 Cerebrovascular disease, unspecified: Secondary | ICD-10-CM | POA: Diagnosis not present

## 2022-03-10 DIAGNOSIS — I480 Paroxysmal atrial fibrillation: Secondary | ICD-10-CM | POA: Diagnosis not present

## 2022-03-11 DIAGNOSIS — M7732 Calcaneal spur, left foot: Secondary | ICD-10-CM | POA: Diagnosis not present

## 2022-03-11 DIAGNOSIS — R1312 Dysphagia, oropharyngeal phase: Secondary | ICD-10-CM | POA: Diagnosis not present

## 2022-03-11 DIAGNOSIS — R278 Other lack of coordination: Secondary | ICD-10-CM | POA: Diagnosis not present

## 2022-03-11 DIAGNOSIS — M6281 Muscle weakness (generalized): Secondary | ICD-10-CM | POA: Diagnosis not present

## 2022-03-12 DIAGNOSIS — R278 Other lack of coordination: Secondary | ICD-10-CM | POA: Diagnosis not present

## 2022-03-12 DIAGNOSIS — M6281 Muscle weakness (generalized): Secondary | ICD-10-CM | POA: Diagnosis not present

## 2022-03-12 DIAGNOSIS — R1312 Dysphagia, oropharyngeal phase: Secondary | ICD-10-CM | POA: Diagnosis not present

## 2022-03-13 DIAGNOSIS — F01C0 Vascular dementia, severe, without behavioral disturbance, psychotic disturbance, mood disturbance, and anxiety: Secondary | ICD-10-CM | POA: Diagnosis not present

## 2022-03-13 DIAGNOSIS — M6281 Muscle weakness (generalized): Secondary | ICD-10-CM | POA: Diagnosis not present

## 2022-03-13 DIAGNOSIS — R1312 Dysphagia, oropharyngeal phase: Secondary | ICD-10-CM | POA: Diagnosis not present

## 2022-03-13 DIAGNOSIS — I69811 Memory deficit following other cerebrovascular disease: Secondary | ICD-10-CM | POA: Diagnosis not present

## 2022-03-13 DIAGNOSIS — R278 Other lack of coordination: Secondary | ICD-10-CM | POA: Diagnosis not present

## 2022-03-16 DIAGNOSIS — R1312 Dysphagia, oropharyngeal phase: Secondary | ICD-10-CM | POA: Diagnosis not present

## 2022-03-16 DIAGNOSIS — R278 Other lack of coordination: Secondary | ICD-10-CM | POA: Diagnosis not present

## 2022-03-16 DIAGNOSIS — M6281 Muscle weakness (generalized): Secondary | ICD-10-CM | POA: Diagnosis not present

## 2022-03-17 DIAGNOSIS — R278 Other lack of coordination: Secondary | ICD-10-CM | POA: Diagnosis not present

## 2022-03-17 DIAGNOSIS — M6281 Muscle weakness (generalized): Secondary | ICD-10-CM | POA: Diagnosis not present

## 2022-03-17 DIAGNOSIS — R1312 Dysphagia, oropharyngeal phase: Secondary | ICD-10-CM | POA: Diagnosis not present

## 2022-03-18 DIAGNOSIS — R1312 Dysphagia, oropharyngeal phase: Secondary | ICD-10-CM | POA: Diagnosis not present

## 2022-03-18 DIAGNOSIS — M6281 Muscle weakness (generalized): Secondary | ICD-10-CM | POA: Diagnosis not present

## 2022-03-18 DIAGNOSIS — R278 Other lack of coordination: Secondary | ICD-10-CM | POA: Diagnosis not present

## 2022-03-19 DIAGNOSIS — M6281 Muscle weakness (generalized): Secondary | ICD-10-CM | POA: Diagnosis not present

## 2022-03-19 DIAGNOSIS — R1312 Dysphagia, oropharyngeal phase: Secondary | ICD-10-CM | POA: Diagnosis not present

## 2022-03-19 DIAGNOSIS — R278 Other lack of coordination: Secondary | ICD-10-CM | POA: Diagnosis not present

## 2022-03-19 DIAGNOSIS — R309 Painful micturition, unspecified: Secondary | ICD-10-CM | POA: Diagnosis not present

## 2022-03-20 DIAGNOSIS — R1312 Dysphagia, oropharyngeal phase: Secondary | ICD-10-CM | POA: Diagnosis not present

## 2022-03-20 DIAGNOSIS — I480 Paroxysmal atrial fibrillation: Secondary | ICD-10-CM | POA: Diagnosis not present

## 2022-03-20 DIAGNOSIS — M6281 Muscle weakness (generalized): Secondary | ICD-10-CM | POA: Diagnosis not present

## 2022-03-20 DIAGNOSIS — I5042 Chronic combined systolic (congestive) and diastolic (congestive) heart failure: Secondary | ICD-10-CM | POA: Diagnosis not present

## 2022-03-20 DIAGNOSIS — I10 Essential (primary) hypertension: Secondary | ICD-10-CM | POA: Diagnosis not present

## 2022-03-20 DIAGNOSIS — R278 Other lack of coordination: Secondary | ICD-10-CM | POA: Diagnosis not present

## 2022-03-20 DIAGNOSIS — E119 Type 2 diabetes mellitus without complications: Secondary | ICD-10-CM | POA: Diagnosis not present

## 2022-03-21 DIAGNOSIS — R278 Other lack of coordination: Secondary | ICD-10-CM | POA: Diagnosis not present

## 2022-03-21 DIAGNOSIS — R1312 Dysphagia, oropharyngeal phase: Secondary | ICD-10-CM | POA: Diagnosis not present

## 2022-03-21 DIAGNOSIS — M6281 Muscle weakness (generalized): Secondary | ICD-10-CM | POA: Diagnosis not present

## 2022-03-23 DIAGNOSIS — R1312 Dysphagia, oropharyngeal phase: Secondary | ICD-10-CM | POA: Diagnosis not present

## 2022-03-23 DIAGNOSIS — M6281 Muscle weakness (generalized): Secondary | ICD-10-CM | POA: Diagnosis not present

## 2022-03-23 DIAGNOSIS — R278 Other lack of coordination: Secondary | ICD-10-CM | POA: Diagnosis not present

## 2022-03-24 DIAGNOSIS — M6281 Muscle weakness (generalized): Secondary | ICD-10-CM | POA: Diagnosis not present

## 2022-03-24 DIAGNOSIS — R278 Other lack of coordination: Secondary | ICD-10-CM | POA: Diagnosis not present

## 2022-03-24 DIAGNOSIS — R1312 Dysphagia, oropharyngeal phase: Secondary | ICD-10-CM | POA: Diagnosis not present

## 2022-03-25 DIAGNOSIS — M6281 Muscle weakness (generalized): Secondary | ICD-10-CM | POA: Diagnosis not present

## 2022-03-25 DIAGNOSIS — R278 Other lack of coordination: Secondary | ICD-10-CM | POA: Diagnosis not present

## 2022-03-25 DIAGNOSIS — R1312 Dysphagia, oropharyngeal phase: Secondary | ICD-10-CM | POA: Diagnosis not present

## 2022-03-26 DIAGNOSIS — R278 Other lack of coordination: Secondary | ICD-10-CM | POA: Diagnosis not present

## 2022-03-26 DIAGNOSIS — M6281 Muscle weakness (generalized): Secondary | ICD-10-CM | POA: Diagnosis not present

## 2022-03-26 DIAGNOSIS — R1312 Dysphagia, oropharyngeal phase: Secondary | ICD-10-CM | POA: Diagnosis not present

## 2022-03-27 DIAGNOSIS — R1312 Dysphagia, oropharyngeal phase: Secondary | ICD-10-CM | POA: Diagnosis not present

## 2022-03-27 DIAGNOSIS — R278 Other lack of coordination: Secondary | ICD-10-CM | POA: Diagnosis not present

## 2022-03-27 DIAGNOSIS — M6281 Muscle weakness (generalized): Secondary | ICD-10-CM | POA: Diagnosis not present

## 2022-03-30 DIAGNOSIS — M6281 Muscle weakness (generalized): Secondary | ICD-10-CM | POA: Diagnosis not present

## 2022-03-30 DIAGNOSIS — N390 Urinary tract infection, site not specified: Secondary | ICD-10-CM | POA: Diagnosis not present

## 2022-03-30 DIAGNOSIS — R3914 Feeling of incomplete bladder emptying: Secondary | ICD-10-CM | POA: Diagnosis not present

## 2022-03-30 DIAGNOSIS — R278 Other lack of coordination: Secondary | ICD-10-CM | POA: Diagnosis not present

## 2022-03-30 DIAGNOSIS — R1312 Dysphagia, oropharyngeal phase: Secondary | ICD-10-CM | POA: Diagnosis not present

## 2022-04-01 DIAGNOSIS — M6281 Muscle weakness (generalized): Secondary | ICD-10-CM | POA: Diagnosis not present

## 2022-04-01 DIAGNOSIS — R1312 Dysphagia, oropharyngeal phase: Secondary | ICD-10-CM | POA: Diagnosis not present

## 2022-04-01 DIAGNOSIS — R278 Other lack of coordination: Secondary | ICD-10-CM | POA: Diagnosis not present

## 2022-04-02 DIAGNOSIS — R1312 Dysphagia, oropharyngeal phase: Secondary | ICD-10-CM | POA: Diagnosis not present

## 2022-04-02 DIAGNOSIS — M6281 Muscle weakness (generalized): Secondary | ICD-10-CM | POA: Diagnosis not present

## 2022-04-02 DIAGNOSIS — R278 Other lack of coordination: Secondary | ICD-10-CM | POA: Diagnosis not present

## 2022-04-03 DIAGNOSIS — M6281 Muscle weakness (generalized): Secondary | ICD-10-CM | POA: Diagnosis not present

## 2022-04-03 DIAGNOSIS — R1312 Dysphagia, oropharyngeal phase: Secondary | ICD-10-CM | POA: Diagnosis not present

## 2022-04-03 DIAGNOSIS — R278 Other lack of coordination: Secondary | ICD-10-CM | POA: Diagnosis not present

## 2022-04-06 DIAGNOSIS — R278 Other lack of coordination: Secondary | ICD-10-CM | POA: Diagnosis not present

## 2022-04-06 DIAGNOSIS — R1312 Dysphagia, oropharyngeal phase: Secondary | ICD-10-CM | POA: Diagnosis not present

## 2022-04-06 DIAGNOSIS — M6281 Muscle weakness (generalized): Secondary | ICD-10-CM | POA: Diagnosis not present

## 2022-04-07 DIAGNOSIS — R278 Other lack of coordination: Secondary | ICD-10-CM | POA: Diagnosis not present

## 2022-04-07 DIAGNOSIS — R1312 Dysphagia, oropharyngeal phase: Secondary | ICD-10-CM | POA: Diagnosis not present

## 2022-04-07 DIAGNOSIS — M6281 Muscle weakness (generalized): Secondary | ICD-10-CM | POA: Diagnosis not present

## 2022-04-08 DIAGNOSIS — R278 Other lack of coordination: Secondary | ICD-10-CM | POA: Diagnosis not present

## 2022-04-08 DIAGNOSIS — R1312 Dysphagia, oropharyngeal phase: Secondary | ICD-10-CM | POA: Diagnosis not present

## 2022-04-08 DIAGNOSIS — M6281 Muscle weakness (generalized): Secondary | ICD-10-CM | POA: Diagnosis not present

## 2022-04-09 DIAGNOSIS — R1312 Dysphagia, oropharyngeal phase: Secondary | ICD-10-CM | POA: Diagnosis not present

## 2022-04-09 DIAGNOSIS — R278 Other lack of coordination: Secondary | ICD-10-CM | POA: Diagnosis not present

## 2022-04-09 DIAGNOSIS — M6281 Muscle weakness (generalized): Secondary | ICD-10-CM | POA: Diagnosis not present

## 2022-04-10 DIAGNOSIS — R278 Other lack of coordination: Secondary | ICD-10-CM | POA: Diagnosis not present

## 2022-04-10 DIAGNOSIS — R1312 Dysphagia, oropharyngeal phase: Secondary | ICD-10-CM | POA: Diagnosis not present

## 2022-04-10 DIAGNOSIS — M6281 Muscle weakness (generalized): Secondary | ICD-10-CM | POA: Diagnosis not present

## 2022-04-13 DIAGNOSIS — R1312 Dysphagia, oropharyngeal phase: Secondary | ICD-10-CM | POA: Diagnosis not present

## 2022-04-13 DIAGNOSIS — R278 Other lack of coordination: Secondary | ICD-10-CM | POA: Diagnosis not present

## 2022-04-13 DIAGNOSIS — M6281 Muscle weakness (generalized): Secondary | ICD-10-CM | POA: Diagnosis not present

## 2022-04-14 DIAGNOSIS — R278 Other lack of coordination: Secondary | ICD-10-CM | POA: Diagnosis not present

## 2022-04-14 DIAGNOSIS — M6281 Muscle weakness (generalized): Secondary | ICD-10-CM | POA: Diagnosis not present

## 2022-04-14 DIAGNOSIS — R1312 Dysphagia, oropharyngeal phase: Secondary | ICD-10-CM | POA: Diagnosis not present

## 2022-04-15 DIAGNOSIS — R278 Other lack of coordination: Secondary | ICD-10-CM | POA: Diagnosis not present

## 2022-04-15 DIAGNOSIS — R1312 Dysphagia, oropharyngeal phase: Secondary | ICD-10-CM | POA: Diagnosis not present

## 2022-04-15 DIAGNOSIS — M6281 Muscle weakness (generalized): Secondary | ICD-10-CM | POA: Diagnosis not present

## 2022-04-16 DIAGNOSIS — Z8673 Personal history of transient ischemic attack (TIA), and cerebral infarction without residual deficits: Secondary | ICD-10-CM | POA: Diagnosis not present

## 2022-04-16 DIAGNOSIS — I480 Paroxysmal atrial fibrillation: Secondary | ICD-10-CM | POA: Diagnosis not present

## 2022-04-16 DIAGNOSIS — I5042 Chronic combined systolic (congestive) and diastolic (congestive) heart failure: Secondary | ICD-10-CM | POA: Diagnosis not present

## 2022-04-16 DIAGNOSIS — E119 Type 2 diabetes mellitus without complications: Secondary | ICD-10-CM | POA: Diagnosis not present

## 2022-04-17 DIAGNOSIS — M6281 Muscle weakness (generalized): Secondary | ICD-10-CM | POA: Diagnosis not present

## 2022-04-17 DIAGNOSIS — R1312 Dysphagia, oropharyngeal phase: Secondary | ICD-10-CM | POA: Diagnosis not present

## 2022-04-17 DIAGNOSIS — R278 Other lack of coordination: Secondary | ICD-10-CM | POA: Diagnosis not present

## 2022-04-20 DIAGNOSIS — R339 Retention of urine, unspecified: Secondary | ICD-10-CM | POA: Diagnosis not present

## 2022-04-20 DIAGNOSIS — M6281 Muscle weakness (generalized): Secondary | ICD-10-CM | POA: Diagnosis not present

## 2022-04-20 DIAGNOSIS — R1312 Dysphagia, oropharyngeal phase: Secondary | ICD-10-CM | POA: Diagnosis not present

## 2022-04-20 DIAGNOSIS — R278 Other lack of coordination: Secondary | ICD-10-CM | POA: Diagnosis not present

## 2022-04-21 DIAGNOSIS — R278 Other lack of coordination: Secondary | ICD-10-CM | POA: Diagnosis not present

## 2022-04-21 DIAGNOSIS — R1312 Dysphagia, oropharyngeal phase: Secondary | ICD-10-CM | POA: Diagnosis not present

## 2022-04-21 DIAGNOSIS — M6281 Muscle weakness (generalized): Secondary | ICD-10-CM | POA: Diagnosis not present

## 2022-04-22 DIAGNOSIS — R1312 Dysphagia, oropharyngeal phase: Secondary | ICD-10-CM | POA: Diagnosis not present

## 2022-04-22 DIAGNOSIS — M6281 Muscle weakness (generalized): Secondary | ICD-10-CM | POA: Diagnosis not present

## 2022-04-22 DIAGNOSIS — R278 Other lack of coordination: Secondary | ICD-10-CM | POA: Diagnosis not present

## 2022-04-23 DIAGNOSIS — R5381 Other malaise: Secondary | ICD-10-CM | POA: Diagnosis not present

## 2022-04-23 DIAGNOSIS — L853 Xerosis cutis: Secondary | ICD-10-CM | POA: Diagnosis not present

## 2022-04-23 DIAGNOSIS — R278 Other lack of coordination: Secondary | ICD-10-CM | POA: Diagnosis not present

## 2022-04-23 DIAGNOSIS — R1312 Dysphagia, oropharyngeal phase: Secondary | ICD-10-CM | POA: Diagnosis not present

## 2022-04-23 DIAGNOSIS — M6281 Muscle weakness (generalized): Secondary | ICD-10-CM | POA: Diagnosis not present

## 2022-04-23 DIAGNOSIS — H02132 Senile ectropion of right lower eyelid: Secondary | ICD-10-CM | POA: Diagnosis not present

## 2022-04-23 DIAGNOSIS — H02109 Unspecified ectropion of unspecified eye, unspecified eyelid: Secondary | ICD-10-CM | POA: Diagnosis not present

## 2022-04-24 DIAGNOSIS — I69811 Memory deficit following other cerebrovascular disease: Secondary | ICD-10-CM | POA: Diagnosis not present

## 2022-04-24 DIAGNOSIS — R278 Other lack of coordination: Secondary | ICD-10-CM | POA: Diagnosis not present

## 2022-04-24 DIAGNOSIS — F01C0 Vascular dementia, severe, without behavioral disturbance, psychotic disturbance, mood disturbance, and anxiety: Secondary | ICD-10-CM | POA: Diagnosis not present

## 2022-04-24 DIAGNOSIS — M6281 Muscle weakness (generalized): Secondary | ICD-10-CM | POA: Diagnosis not present

## 2022-04-24 DIAGNOSIS — R1312 Dysphagia, oropharyngeal phase: Secondary | ICD-10-CM | POA: Diagnosis not present

## 2022-04-27 DIAGNOSIS — R1312 Dysphagia, oropharyngeal phase: Secondary | ICD-10-CM | POA: Diagnosis not present

## 2022-04-27 DIAGNOSIS — R278 Other lack of coordination: Secondary | ICD-10-CM | POA: Diagnosis not present

## 2022-04-27 DIAGNOSIS — M6281 Muscle weakness (generalized): Secondary | ICD-10-CM | POA: Diagnosis not present

## 2022-04-29 DIAGNOSIS — R278 Other lack of coordination: Secondary | ICD-10-CM | POA: Diagnosis not present

## 2022-04-29 DIAGNOSIS — M6281 Muscle weakness (generalized): Secondary | ICD-10-CM | POA: Diagnosis not present

## 2022-04-29 DIAGNOSIS — R1312 Dysphagia, oropharyngeal phase: Secondary | ICD-10-CM | POA: Diagnosis not present

## 2022-04-30 DIAGNOSIS — Z7982 Long term (current) use of aspirin: Secondary | ICD-10-CM | POA: Diagnosis not present

## 2022-04-30 DIAGNOSIS — M6281 Muscle weakness (generalized): Secondary | ICD-10-CM | POA: Diagnosis not present

## 2022-04-30 DIAGNOSIS — R278 Other lack of coordination: Secondary | ICD-10-CM | POA: Diagnosis not present

## 2022-04-30 DIAGNOSIS — Z789 Other specified health status: Secondary | ICD-10-CM | POA: Diagnosis not present

## 2022-04-30 DIAGNOSIS — R1312 Dysphagia, oropharyngeal phase: Secondary | ICD-10-CM | POA: Diagnosis not present

## 2022-04-30 DIAGNOSIS — M792 Neuralgia and neuritis, unspecified: Secondary | ICD-10-CM | POA: Diagnosis not present

## 2022-04-30 DIAGNOSIS — Z8673 Personal history of transient ischemic attack (TIA), and cerebral infarction without residual deficits: Secondary | ICD-10-CM | POA: Diagnosis not present

## 2022-04-30 DIAGNOSIS — R5381 Other malaise: Secondary | ICD-10-CM | POA: Diagnosis not present

## 2022-05-01 DIAGNOSIS — M6281 Muscle weakness (generalized): Secondary | ICD-10-CM | POA: Diagnosis not present

## 2022-05-01 DIAGNOSIS — R278 Other lack of coordination: Secondary | ICD-10-CM | POA: Diagnosis not present

## 2022-05-01 DIAGNOSIS — R1312 Dysphagia, oropharyngeal phase: Secondary | ICD-10-CM | POA: Diagnosis not present

## 2022-05-04 DIAGNOSIS — R1312 Dysphagia, oropharyngeal phase: Secondary | ICD-10-CM | POA: Diagnosis not present

## 2022-05-04 DIAGNOSIS — M6281 Muscle weakness (generalized): Secondary | ICD-10-CM | POA: Diagnosis not present

## 2022-05-04 DIAGNOSIS — R278 Other lack of coordination: Secondary | ICD-10-CM | POA: Diagnosis not present

## 2022-05-05 DIAGNOSIS — M6281 Muscle weakness (generalized): Secondary | ICD-10-CM | POA: Diagnosis not present

## 2022-05-05 DIAGNOSIS — Z7984 Long term (current) use of oral hypoglycemic drugs: Secondary | ICD-10-CM | POA: Diagnosis not present

## 2022-05-05 DIAGNOSIS — R1312 Dysphagia, oropharyngeal phase: Secondary | ICD-10-CM | POA: Diagnosis not present

## 2022-05-05 DIAGNOSIS — I482 Chronic atrial fibrillation, unspecified: Secondary | ICD-10-CM | POA: Diagnosis not present

## 2022-05-05 DIAGNOSIS — E785 Hyperlipidemia, unspecified: Secondary | ICD-10-CM | POA: Diagnosis not present

## 2022-05-05 DIAGNOSIS — I10 Essential (primary) hypertension: Secondary | ICD-10-CM | POA: Diagnosis not present

## 2022-05-05 DIAGNOSIS — E119 Type 2 diabetes mellitus without complications: Secondary | ICD-10-CM | POA: Diagnosis not present

## 2022-05-05 DIAGNOSIS — R278 Other lack of coordination: Secondary | ICD-10-CM | POA: Diagnosis not present

## 2022-05-06 ENCOUNTER — Ambulatory Visit: Payer: Medicare Other | Admitting: Adult Health

## 2022-05-06 DIAGNOSIS — R278 Other lack of coordination: Secondary | ICD-10-CM | POA: Diagnosis not present

## 2022-05-06 DIAGNOSIS — M6281 Muscle weakness (generalized): Secondary | ICD-10-CM | POA: Diagnosis not present

## 2022-05-06 DIAGNOSIS — R1312 Dysphagia, oropharyngeal phase: Secondary | ICD-10-CM | POA: Diagnosis not present

## 2022-05-07 DIAGNOSIS — R278 Other lack of coordination: Secondary | ICD-10-CM | POA: Diagnosis not present

## 2022-05-07 DIAGNOSIS — R1312 Dysphagia, oropharyngeal phase: Secondary | ICD-10-CM | POA: Diagnosis not present

## 2022-05-07 DIAGNOSIS — M6281 Muscle weakness (generalized): Secondary | ICD-10-CM | POA: Diagnosis not present

## 2022-05-08 DIAGNOSIS — R278 Other lack of coordination: Secondary | ICD-10-CM | POA: Diagnosis not present

## 2022-05-08 DIAGNOSIS — R1312 Dysphagia, oropharyngeal phase: Secondary | ICD-10-CM | POA: Diagnosis not present

## 2022-05-08 DIAGNOSIS — M6281 Muscle weakness (generalized): Secondary | ICD-10-CM | POA: Diagnosis not present

## 2022-05-11 DIAGNOSIS — R278 Other lack of coordination: Secondary | ICD-10-CM | POA: Diagnosis not present

## 2022-05-11 DIAGNOSIS — M6281 Muscle weakness (generalized): Secondary | ICD-10-CM | POA: Diagnosis not present

## 2022-05-11 DIAGNOSIS — R1312 Dysphagia, oropharyngeal phase: Secondary | ICD-10-CM | POA: Diagnosis not present

## 2022-05-13 DIAGNOSIS — M6281 Muscle weakness (generalized): Secondary | ICD-10-CM | POA: Diagnosis not present

## 2022-05-13 DIAGNOSIS — R1312 Dysphagia, oropharyngeal phase: Secondary | ICD-10-CM | POA: Diagnosis not present

## 2022-05-13 DIAGNOSIS — R278 Other lack of coordination: Secondary | ICD-10-CM | POA: Diagnosis not present

## 2022-05-14 DIAGNOSIS — M6281 Muscle weakness (generalized): Secondary | ICD-10-CM | POA: Diagnosis not present

## 2022-05-14 DIAGNOSIS — R278 Other lack of coordination: Secondary | ICD-10-CM | POA: Diagnosis not present

## 2022-05-14 DIAGNOSIS — R1312 Dysphagia, oropharyngeal phase: Secondary | ICD-10-CM | POA: Diagnosis not present

## 2022-05-15 DIAGNOSIS — M6281 Muscle weakness (generalized): Secondary | ICD-10-CM | POA: Diagnosis not present

## 2022-05-15 DIAGNOSIS — R278 Other lack of coordination: Secondary | ICD-10-CM | POA: Diagnosis not present

## 2022-05-15 DIAGNOSIS — R1312 Dysphagia, oropharyngeal phase: Secondary | ICD-10-CM | POA: Diagnosis not present

## 2022-05-18 ENCOUNTER — Encounter: Payer: Self-pay | Admitting: Adult Health

## 2022-05-18 ENCOUNTER — Telehealth: Payer: Self-pay | Admitting: Adult Health

## 2022-05-18 DIAGNOSIS — R278 Other lack of coordination: Secondary | ICD-10-CM | POA: Diagnosis not present

## 2022-05-18 DIAGNOSIS — R1312 Dysphagia, oropharyngeal phase: Secondary | ICD-10-CM | POA: Diagnosis not present

## 2022-05-18 DIAGNOSIS — M6281 Muscle weakness (generalized): Secondary | ICD-10-CM | POA: Diagnosis not present

## 2022-05-18 NOTE — Telephone Encounter (Signed)
LVM and sent letter in mail informing pt of need to reschedule 06/02/22 appointment - NP out

## 2022-05-20 DIAGNOSIS — H6523 Chronic serous otitis media, bilateral: Secondary | ICD-10-CM | POA: Diagnosis not present

## 2022-05-20 DIAGNOSIS — M6281 Muscle weakness (generalized): Secondary | ICD-10-CM | POA: Diagnosis not present

## 2022-05-20 DIAGNOSIS — R278 Other lack of coordination: Secondary | ICD-10-CM | POA: Diagnosis not present

## 2022-05-20 DIAGNOSIS — H906 Mixed conductive and sensorineural hearing loss, bilateral: Secondary | ICD-10-CM | POA: Diagnosis not present

## 2022-05-20 DIAGNOSIS — R1312 Dysphagia, oropharyngeal phase: Secondary | ICD-10-CM | POA: Diagnosis not present

## 2022-05-21 DIAGNOSIS — R278 Other lack of coordination: Secondary | ICD-10-CM | POA: Diagnosis not present

## 2022-05-21 DIAGNOSIS — R1312 Dysphagia, oropharyngeal phase: Secondary | ICD-10-CM | POA: Diagnosis not present

## 2022-05-21 DIAGNOSIS — M6281 Muscle weakness (generalized): Secondary | ICD-10-CM | POA: Diagnosis not present

## 2022-05-22 DIAGNOSIS — R1312 Dysphagia, oropharyngeal phase: Secondary | ICD-10-CM | POA: Diagnosis not present

## 2022-05-22 DIAGNOSIS — R278 Other lack of coordination: Secondary | ICD-10-CM | POA: Diagnosis not present

## 2022-05-22 DIAGNOSIS — M6281 Muscle weakness (generalized): Secondary | ICD-10-CM | POA: Diagnosis not present

## 2022-05-25 DIAGNOSIS — R1312 Dysphagia, oropharyngeal phase: Secondary | ICD-10-CM | POA: Diagnosis not present

## 2022-05-25 DIAGNOSIS — M6281 Muscle weakness (generalized): Secondary | ICD-10-CM | POA: Diagnosis not present

## 2022-05-25 DIAGNOSIS — R278 Other lack of coordination: Secondary | ICD-10-CM | POA: Diagnosis not present

## 2022-05-26 DIAGNOSIS — R1312 Dysphagia, oropharyngeal phase: Secondary | ICD-10-CM | POA: Diagnosis not present

## 2022-05-26 DIAGNOSIS — M6281 Muscle weakness (generalized): Secondary | ICD-10-CM | POA: Diagnosis not present

## 2022-05-26 DIAGNOSIS — R278 Other lack of coordination: Secondary | ICD-10-CM | POA: Diagnosis not present

## 2022-05-27 DIAGNOSIS — R1312 Dysphagia, oropharyngeal phase: Secondary | ICD-10-CM | POA: Diagnosis not present

## 2022-05-27 DIAGNOSIS — R278 Other lack of coordination: Secondary | ICD-10-CM | POA: Diagnosis not present

## 2022-05-27 DIAGNOSIS — M6281 Muscle weakness (generalized): Secondary | ICD-10-CM | POA: Diagnosis not present

## 2022-05-28 ENCOUNTER — Ambulatory Visit: Payer: Medicare Other | Admitting: Adult Health

## 2022-05-28 DIAGNOSIS — M6281 Muscle weakness (generalized): Secondary | ICD-10-CM | POA: Diagnosis not present

## 2022-05-28 DIAGNOSIS — R278 Other lack of coordination: Secondary | ICD-10-CM | POA: Diagnosis not present

## 2022-05-28 DIAGNOSIS — R1312 Dysphagia, oropharyngeal phase: Secondary | ICD-10-CM | POA: Diagnosis not present

## 2022-05-29 DIAGNOSIS — M6281 Muscle weakness (generalized): Secondary | ICD-10-CM | POA: Diagnosis not present

## 2022-05-29 DIAGNOSIS — H906 Mixed conductive and sensorineural hearing loss, bilateral: Secondary | ICD-10-CM | POA: Diagnosis not present

## 2022-05-29 DIAGNOSIS — R278 Other lack of coordination: Secondary | ICD-10-CM | POA: Diagnosis not present

## 2022-05-29 DIAGNOSIS — H6523 Chronic serous otitis media, bilateral: Secondary | ICD-10-CM | POA: Diagnosis not present

## 2022-05-29 DIAGNOSIS — R1312 Dysphagia, oropharyngeal phase: Secondary | ICD-10-CM | POA: Diagnosis not present

## 2022-06-01 DIAGNOSIS — R338 Other retention of urine: Secondary | ICD-10-CM | POA: Diagnosis not present

## 2022-06-01 DIAGNOSIS — R3914 Feeling of incomplete bladder emptying: Secondary | ICD-10-CM | POA: Diagnosis not present

## 2022-06-01 DIAGNOSIS — Z87440 Personal history of urinary (tract) infections: Secondary | ICD-10-CM | POA: Diagnosis not present

## 2022-06-02 ENCOUNTER — Ambulatory Visit: Payer: Medicare Other | Admitting: Adult Health

## 2022-06-02 DIAGNOSIS — R1312 Dysphagia, oropharyngeal phase: Secondary | ICD-10-CM | POA: Diagnosis not present

## 2022-06-02 DIAGNOSIS — M792 Neuralgia and neuritis, unspecified: Secondary | ICD-10-CM | POA: Diagnosis not present

## 2022-06-02 DIAGNOSIS — W19XXXA Unspecified fall, initial encounter: Secondary | ICD-10-CM | POA: Diagnosis not present

## 2022-06-02 DIAGNOSIS — Z9622 Myringotomy tube(s) status: Secondary | ICD-10-CM | POA: Diagnosis not present

## 2022-06-02 DIAGNOSIS — M6281 Muscle weakness (generalized): Secondary | ICD-10-CM | POA: Diagnosis not present

## 2022-06-02 DIAGNOSIS — S0181XD Laceration without foreign body of other part of head, subsequent encounter: Secondary | ICD-10-CM | POA: Diagnosis not present

## 2022-06-02 DIAGNOSIS — R278 Other lack of coordination: Secondary | ICD-10-CM | POA: Diagnosis not present

## 2022-06-03 DIAGNOSIS — R278 Other lack of coordination: Secondary | ICD-10-CM | POA: Diagnosis not present

## 2022-06-03 DIAGNOSIS — M6281 Muscle weakness (generalized): Secondary | ICD-10-CM | POA: Diagnosis not present

## 2022-06-03 DIAGNOSIS — R1312 Dysphagia, oropharyngeal phase: Secondary | ICD-10-CM | POA: Diagnosis not present

## 2022-06-04 DIAGNOSIS — M6281 Muscle weakness (generalized): Secondary | ICD-10-CM | POA: Diagnosis not present

## 2022-06-04 DIAGNOSIS — R1312 Dysphagia, oropharyngeal phase: Secondary | ICD-10-CM | POA: Diagnosis not present

## 2022-06-04 DIAGNOSIS — R278 Other lack of coordination: Secondary | ICD-10-CM | POA: Diagnosis not present

## 2022-06-05 DIAGNOSIS — R278 Other lack of coordination: Secondary | ICD-10-CM | POA: Diagnosis not present

## 2022-06-05 DIAGNOSIS — M6281 Muscle weakness (generalized): Secondary | ICD-10-CM | POA: Diagnosis not present

## 2022-06-05 DIAGNOSIS — R1312 Dysphagia, oropharyngeal phase: Secondary | ICD-10-CM | POA: Diagnosis not present

## 2022-06-08 DIAGNOSIS — R278 Other lack of coordination: Secondary | ICD-10-CM | POA: Diagnosis not present

## 2022-06-08 DIAGNOSIS — I10 Essential (primary) hypertension: Secondary | ICD-10-CM | POA: Diagnosis not present

## 2022-06-08 DIAGNOSIS — R1312 Dysphagia, oropharyngeal phase: Secondary | ICD-10-CM | POA: Diagnosis not present

## 2022-06-08 DIAGNOSIS — E785 Hyperlipidemia, unspecified: Secondary | ICD-10-CM | POA: Diagnosis not present

## 2022-06-08 DIAGNOSIS — M6281 Muscle weakness (generalized): Secondary | ICD-10-CM | POA: Diagnosis not present

## 2022-06-08 DIAGNOSIS — I482 Chronic atrial fibrillation, unspecified: Secondary | ICD-10-CM | POA: Diagnosis not present

## 2022-06-08 DIAGNOSIS — R2990 Unspecified symptoms and signs involving the nervous system: Secondary | ICD-10-CM | POA: Diagnosis not present

## 2022-06-10 DIAGNOSIS — R1312 Dysphagia, oropharyngeal phase: Secondary | ICD-10-CM | POA: Diagnosis not present

## 2022-06-10 DIAGNOSIS — M6281 Muscle weakness (generalized): Secondary | ICD-10-CM | POA: Diagnosis not present

## 2022-06-10 DIAGNOSIS — R278 Other lack of coordination: Secondary | ICD-10-CM | POA: Diagnosis not present

## 2022-06-11 DIAGNOSIS — J4530 Mild persistent asthma, uncomplicated: Secondary | ICD-10-CM | POA: Diagnosis not present

## 2022-06-11 DIAGNOSIS — R1312 Dysphagia, oropharyngeal phase: Secondary | ICD-10-CM | POA: Diagnosis not present

## 2022-06-11 DIAGNOSIS — R531 Weakness: Secondary | ICD-10-CM | POA: Diagnosis not present

## 2022-06-11 DIAGNOSIS — M6281 Muscle weakness (generalized): Secondary | ICD-10-CM | POA: Diagnosis not present

## 2022-06-11 DIAGNOSIS — R278 Other lack of coordination: Secondary | ICD-10-CM | POA: Diagnosis not present

## 2022-06-12 DIAGNOSIS — R1312 Dysphagia, oropharyngeal phase: Secondary | ICD-10-CM | POA: Diagnosis not present

## 2022-06-12 DIAGNOSIS — M6281 Muscle weakness (generalized): Secondary | ICD-10-CM | POA: Diagnosis not present

## 2022-06-12 DIAGNOSIS — R278 Other lack of coordination: Secondary | ICD-10-CM | POA: Diagnosis not present

## 2022-06-16 DIAGNOSIS — R1312 Dysphagia, oropharyngeal phase: Secondary | ICD-10-CM | POA: Diagnosis not present

## 2022-06-16 DIAGNOSIS — B351 Tinea unguium: Secondary | ICD-10-CM | POA: Diagnosis not present

## 2022-06-16 DIAGNOSIS — R278 Other lack of coordination: Secondary | ICD-10-CM | POA: Diagnosis not present

## 2022-06-16 DIAGNOSIS — M6281 Muscle weakness (generalized): Secondary | ICD-10-CM | POA: Diagnosis not present

## 2022-06-16 DIAGNOSIS — L600 Ingrowing nail: Secondary | ICD-10-CM | POA: Diagnosis not present

## 2022-06-16 DIAGNOSIS — L603 Nail dystrophy: Secondary | ICD-10-CM | POA: Diagnosis not present

## 2022-06-16 DIAGNOSIS — L84 Corns and callosities: Secondary | ICD-10-CM | POA: Diagnosis not present

## 2022-06-17 DIAGNOSIS — M6281 Muscle weakness (generalized): Secondary | ICD-10-CM | POA: Diagnosis not present

## 2022-06-17 DIAGNOSIS — R278 Other lack of coordination: Secondary | ICD-10-CM | POA: Diagnosis not present

## 2022-06-17 DIAGNOSIS — I5042 Chronic combined systolic (congestive) and diastolic (congestive) heart failure: Secondary | ICD-10-CM | POA: Diagnosis not present

## 2022-06-17 DIAGNOSIS — R1312 Dysphagia, oropharyngeal phase: Secondary | ICD-10-CM | POA: Diagnosis not present

## 2022-06-18 DIAGNOSIS — I5042 Chronic combined systolic (congestive) and diastolic (congestive) heart failure: Secondary | ICD-10-CM | POA: Diagnosis not present

## 2022-06-18 DIAGNOSIS — M6281 Muscle weakness (generalized): Secondary | ICD-10-CM | POA: Diagnosis not present

## 2022-06-18 DIAGNOSIS — R278 Other lack of coordination: Secondary | ICD-10-CM | POA: Diagnosis not present

## 2022-06-18 DIAGNOSIS — G4720 Circadian rhythm sleep disorder, unspecified type: Secondary | ICD-10-CM | POA: Diagnosis not present

## 2022-06-18 DIAGNOSIS — R1312 Dysphagia, oropharyngeal phase: Secondary | ICD-10-CM | POA: Diagnosis not present

## 2022-06-19 DIAGNOSIS — R278 Other lack of coordination: Secondary | ICD-10-CM | POA: Diagnosis not present

## 2022-06-19 DIAGNOSIS — R1312 Dysphagia, oropharyngeal phase: Secondary | ICD-10-CM | POA: Diagnosis not present

## 2022-06-19 DIAGNOSIS — I5042 Chronic combined systolic (congestive) and diastolic (congestive) heart failure: Secondary | ICD-10-CM | POA: Diagnosis not present

## 2022-06-19 DIAGNOSIS — E785 Hyperlipidemia, unspecified: Secondary | ICD-10-CM | POA: Diagnosis not present

## 2022-06-19 DIAGNOSIS — M6281 Muscle weakness (generalized): Secondary | ICD-10-CM | POA: Diagnosis not present

## 2022-06-22 DIAGNOSIS — M6281 Muscle weakness (generalized): Secondary | ICD-10-CM | POA: Diagnosis not present

## 2022-06-22 DIAGNOSIS — R1312 Dysphagia, oropharyngeal phase: Secondary | ICD-10-CM | POA: Diagnosis not present

## 2022-06-22 DIAGNOSIS — I5042 Chronic combined systolic (congestive) and diastolic (congestive) heart failure: Secondary | ICD-10-CM | POA: Diagnosis not present

## 2022-06-22 DIAGNOSIS — R278 Other lack of coordination: Secondary | ICD-10-CM | POA: Diagnosis not present

## 2022-06-23 DIAGNOSIS — E119 Type 2 diabetes mellitus without complications: Secondary | ICD-10-CM | POA: Diagnosis not present

## 2022-06-23 DIAGNOSIS — I5042 Chronic combined systolic (congestive) and diastolic (congestive) heart failure: Secondary | ICD-10-CM | POA: Diagnosis not present

## 2022-06-23 DIAGNOSIS — I69354 Hemiplegia and hemiparesis following cerebral infarction affecting left non-dominant side: Secondary | ICD-10-CM | POA: Diagnosis not present

## 2022-06-23 DIAGNOSIS — E785 Hyperlipidemia, unspecified: Secondary | ICD-10-CM | POA: Diagnosis not present

## 2022-06-23 DIAGNOSIS — R54 Age-related physical debility: Secondary | ICD-10-CM | POA: Diagnosis not present

## 2022-06-23 DIAGNOSIS — M6281 Muscle weakness (generalized): Secondary | ICD-10-CM | POA: Diagnosis not present

## 2022-06-23 DIAGNOSIS — R278 Other lack of coordination: Secondary | ICD-10-CM | POA: Diagnosis not present

## 2022-06-23 DIAGNOSIS — R1310 Dysphagia, unspecified: Secondary | ICD-10-CM | POA: Diagnosis not present

## 2022-06-23 DIAGNOSIS — R1312 Dysphagia, oropharyngeal phase: Secondary | ICD-10-CM | POA: Diagnosis not present

## 2022-06-23 DIAGNOSIS — I69391 Dysphagia following cerebral infarction: Secondary | ICD-10-CM | POA: Diagnosis not present

## 2022-06-23 DIAGNOSIS — I69322 Dysarthria following cerebral infarction: Secondary | ICD-10-CM | POA: Diagnosis not present

## 2022-06-23 DIAGNOSIS — Z7984 Long term (current) use of oral hypoglycemic drugs: Secondary | ICD-10-CM | POA: Diagnosis not present

## 2022-06-23 DIAGNOSIS — I482 Chronic atrial fibrillation, unspecified: Secondary | ICD-10-CM | POA: Diagnosis not present

## 2022-06-24 DIAGNOSIS — M6281 Muscle weakness (generalized): Secondary | ICD-10-CM | POA: Diagnosis not present

## 2022-06-24 DIAGNOSIS — R278 Other lack of coordination: Secondary | ICD-10-CM | POA: Diagnosis not present

## 2022-06-24 DIAGNOSIS — I5042 Chronic combined systolic (congestive) and diastolic (congestive) heart failure: Secondary | ICD-10-CM | POA: Diagnosis not present

## 2022-06-24 DIAGNOSIS — R1312 Dysphagia, oropharyngeal phase: Secondary | ICD-10-CM | POA: Diagnosis not present

## 2022-06-25 DIAGNOSIS — R1312 Dysphagia, oropharyngeal phase: Secondary | ICD-10-CM | POA: Diagnosis not present

## 2022-06-25 DIAGNOSIS — M6281 Muscle weakness (generalized): Secondary | ICD-10-CM | POA: Diagnosis not present

## 2022-06-25 DIAGNOSIS — R278 Other lack of coordination: Secondary | ICD-10-CM | POA: Diagnosis not present

## 2022-06-25 DIAGNOSIS — I5042 Chronic combined systolic (congestive) and diastolic (congestive) heart failure: Secondary | ICD-10-CM | POA: Diagnosis not present

## 2022-06-26 DIAGNOSIS — I5042 Chronic combined systolic (congestive) and diastolic (congestive) heart failure: Secondary | ICD-10-CM | POA: Diagnosis not present

## 2022-06-26 DIAGNOSIS — M6281 Muscle weakness (generalized): Secondary | ICD-10-CM | POA: Diagnosis not present

## 2022-06-26 DIAGNOSIS — R1312 Dysphagia, oropharyngeal phase: Secondary | ICD-10-CM | POA: Diagnosis not present

## 2022-06-26 DIAGNOSIS — R278 Other lack of coordination: Secondary | ICD-10-CM | POA: Diagnosis not present

## 2022-06-26 DIAGNOSIS — I69811 Memory deficit following other cerebrovascular disease: Secondary | ICD-10-CM | POA: Diagnosis not present

## 2022-06-26 DIAGNOSIS — F01C0 Vascular dementia, severe, without behavioral disturbance, psychotic disturbance, mood disturbance, and anxiety: Secondary | ICD-10-CM | POA: Diagnosis not present

## 2022-06-29 DIAGNOSIS — M6281 Muscle weakness (generalized): Secondary | ICD-10-CM | POA: Diagnosis not present

## 2022-06-29 DIAGNOSIS — I5042 Chronic combined systolic (congestive) and diastolic (congestive) heart failure: Secondary | ICD-10-CM | POA: Diagnosis not present

## 2022-06-29 DIAGNOSIS — I482 Chronic atrial fibrillation, unspecified: Secondary | ICD-10-CM | POA: Diagnosis not present

## 2022-06-29 DIAGNOSIS — I10 Essential (primary) hypertension: Secondary | ICD-10-CM | POA: Diagnosis not present

## 2022-06-29 DIAGNOSIS — R278 Other lack of coordination: Secondary | ICD-10-CM | POA: Diagnosis not present

## 2022-06-29 DIAGNOSIS — L308 Other specified dermatitis: Secondary | ICD-10-CM | POA: Diagnosis not present

## 2022-06-29 DIAGNOSIS — R1312 Dysphagia, oropharyngeal phase: Secondary | ICD-10-CM | POA: Diagnosis not present

## 2022-06-30 DIAGNOSIS — R1312 Dysphagia, oropharyngeal phase: Secondary | ICD-10-CM | POA: Diagnosis not present

## 2022-06-30 DIAGNOSIS — M6281 Muscle weakness (generalized): Secondary | ICD-10-CM | POA: Diagnosis not present

## 2022-06-30 DIAGNOSIS — R278 Other lack of coordination: Secondary | ICD-10-CM | POA: Diagnosis not present

## 2022-06-30 DIAGNOSIS — I5042 Chronic combined systolic (congestive) and diastolic (congestive) heart failure: Secondary | ICD-10-CM | POA: Diagnosis not present

## 2022-07-01 DIAGNOSIS — R1312 Dysphagia, oropharyngeal phase: Secondary | ICD-10-CM | POA: Diagnosis not present

## 2022-07-01 DIAGNOSIS — M6281 Muscle weakness (generalized): Secondary | ICD-10-CM | POA: Diagnosis not present

## 2022-07-01 DIAGNOSIS — R278 Other lack of coordination: Secondary | ICD-10-CM | POA: Diagnosis not present

## 2022-07-01 DIAGNOSIS — I5042 Chronic combined systolic (congestive) and diastolic (congestive) heart failure: Secondary | ICD-10-CM | POA: Diagnosis not present

## 2022-07-02 DIAGNOSIS — I5042 Chronic combined systolic (congestive) and diastolic (congestive) heart failure: Secondary | ICD-10-CM | POA: Diagnosis not present

## 2022-07-02 DIAGNOSIS — R1312 Dysphagia, oropharyngeal phase: Secondary | ICD-10-CM | POA: Diagnosis not present

## 2022-07-02 DIAGNOSIS — M6281 Muscle weakness (generalized): Secondary | ICD-10-CM | POA: Diagnosis not present

## 2022-07-02 DIAGNOSIS — R278 Other lack of coordination: Secondary | ICD-10-CM | POA: Diagnosis not present

## 2022-07-07 DIAGNOSIS — M6281 Muscle weakness (generalized): Secondary | ICD-10-CM | POA: Diagnosis not present

## 2022-07-07 DIAGNOSIS — R1312 Dysphagia, oropharyngeal phase: Secondary | ICD-10-CM | POA: Diagnosis not present

## 2022-07-07 DIAGNOSIS — I5042 Chronic combined systolic (congestive) and diastolic (congestive) heart failure: Secondary | ICD-10-CM | POA: Diagnosis not present

## 2022-07-07 DIAGNOSIS — R278 Other lack of coordination: Secondary | ICD-10-CM | POA: Diagnosis not present

## 2022-07-08 DIAGNOSIS — N390 Urinary tract infection, site not specified: Secondary | ICD-10-CM | POA: Diagnosis not present

## 2022-07-08 DIAGNOSIS — R278 Other lack of coordination: Secondary | ICD-10-CM | POA: Diagnosis not present

## 2022-07-08 DIAGNOSIS — I5042 Chronic combined systolic (congestive) and diastolic (congestive) heart failure: Secondary | ICD-10-CM | POA: Diagnosis not present

## 2022-07-08 DIAGNOSIS — R1312 Dysphagia, oropharyngeal phase: Secondary | ICD-10-CM | POA: Diagnosis not present

## 2022-07-08 DIAGNOSIS — M6281 Muscle weakness (generalized): Secondary | ICD-10-CM | POA: Diagnosis not present

## 2022-07-09 DIAGNOSIS — K567 Ileus, unspecified: Secondary | ICD-10-CM | POA: Diagnosis not present

## 2022-07-09 DIAGNOSIS — M1990 Unspecified osteoarthritis, unspecified site: Secondary | ICD-10-CM | POA: Diagnosis not present

## 2022-07-09 DIAGNOSIS — M792 Neuralgia and neuritis, unspecified: Secondary | ICD-10-CM | POA: Diagnosis not present

## 2022-07-10 DIAGNOSIS — E119 Type 2 diabetes mellitus without complications: Secondary | ICD-10-CM | POA: Diagnosis not present

## 2022-07-10 DIAGNOSIS — I5042 Chronic combined systolic (congestive) and diastolic (congestive) heart failure: Secondary | ICD-10-CM | POA: Diagnosis not present

## 2022-07-10 DIAGNOSIS — E785 Hyperlipidemia, unspecified: Secondary | ICD-10-CM | POA: Diagnosis not present

## 2022-07-10 DIAGNOSIS — Z8673 Personal history of transient ischemic attack (TIA), and cerebral infarction without residual deficits: Secondary | ICD-10-CM | POA: Diagnosis not present

## 2022-07-10 DIAGNOSIS — R278 Other lack of coordination: Secondary | ICD-10-CM | POA: Diagnosis not present

## 2022-07-10 DIAGNOSIS — R1312 Dysphagia, oropharyngeal phase: Secondary | ICD-10-CM | POA: Diagnosis not present

## 2022-07-10 DIAGNOSIS — M6281 Muscle weakness (generalized): Secondary | ICD-10-CM | POA: Diagnosis not present

## 2022-07-10 DIAGNOSIS — I110 Hypertensive heart disease with heart failure: Secondary | ICD-10-CM | POA: Diagnosis not present

## 2022-07-13 DIAGNOSIS — R278 Other lack of coordination: Secondary | ICD-10-CM | POA: Diagnosis not present

## 2022-07-13 DIAGNOSIS — I5042 Chronic combined systolic (congestive) and diastolic (congestive) heart failure: Secondary | ICD-10-CM | POA: Diagnosis not present

## 2022-07-13 DIAGNOSIS — M6281 Muscle weakness (generalized): Secondary | ICD-10-CM | POA: Diagnosis not present

## 2022-07-13 DIAGNOSIS — K562 Volvulus: Secondary | ICD-10-CM | POA: Diagnosis not present

## 2022-07-13 DIAGNOSIS — R1312 Dysphagia, oropharyngeal phase: Secondary | ICD-10-CM | POA: Diagnosis not present

## 2022-07-13 DIAGNOSIS — R195 Other fecal abnormalities: Secondary | ICD-10-CM | POA: Diagnosis not present

## 2022-07-14 DIAGNOSIS — I959 Hypotension, unspecified: Secondary | ICD-10-CM | POA: Diagnosis not present

## 2022-07-14 DIAGNOSIS — E119 Type 2 diabetes mellitus without complications: Secondary | ICD-10-CM | POA: Diagnosis not present

## 2022-07-14 DIAGNOSIS — R0989 Other specified symptoms and signs involving the circulatory and respiratory systems: Secondary | ICD-10-CM | POA: Diagnosis not present

## 2022-07-14 DIAGNOSIS — M1990 Unspecified osteoarthritis, unspecified site: Secondary | ICD-10-CM | POA: Diagnosis not present

## 2022-07-14 DIAGNOSIS — K567 Ileus, unspecified: Secondary | ICD-10-CM | POA: Diagnosis not present

## 2022-07-14 DIAGNOSIS — Z8673 Personal history of transient ischemic attack (TIA), and cerebral infarction without residual deficits: Secondary | ICD-10-CM | POA: Diagnosis not present

## 2022-07-15 DIAGNOSIS — M6281 Muscle weakness (generalized): Secondary | ICD-10-CM | POA: Diagnosis not present

## 2022-07-15 DIAGNOSIS — K567 Ileus, unspecified: Secondary | ICD-10-CM | POA: Diagnosis not present

## 2022-07-15 DIAGNOSIS — I5042 Chronic combined systolic (congestive) and diastolic (congestive) heart failure: Secondary | ICD-10-CM | POA: Diagnosis not present

## 2022-07-15 DIAGNOSIS — R278 Other lack of coordination: Secondary | ICD-10-CM | POA: Diagnosis not present

## 2022-07-15 DIAGNOSIS — R1312 Dysphagia, oropharyngeal phase: Secondary | ICD-10-CM | POA: Diagnosis not present

## 2022-07-15 DIAGNOSIS — K562 Volvulus: Secondary | ICD-10-CM | POA: Diagnosis not present

## 2022-07-16 DIAGNOSIS — R1312 Dysphagia, oropharyngeal phase: Secondary | ICD-10-CM | POA: Diagnosis not present

## 2022-07-16 DIAGNOSIS — R278 Other lack of coordination: Secondary | ICD-10-CM | POA: Diagnosis not present

## 2022-07-16 DIAGNOSIS — M6281 Muscle weakness (generalized): Secondary | ICD-10-CM | POA: Diagnosis not present

## 2022-07-16 DIAGNOSIS — I5042 Chronic combined systolic (congestive) and diastolic (congestive) heart failure: Secondary | ICD-10-CM | POA: Diagnosis not present

## 2022-07-17 DIAGNOSIS — R1312 Dysphagia, oropharyngeal phase: Secondary | ICD-10-CM | POA: Diagnosis not present

## 2022-07-17 DIAGNOSIS — R278 Other lack of coordination: Secondary | ICD-10-CM | POA: Diagnosis not present

## 2022-07-17 DIAGNOSIS — M6281 Muscle weakness (generalized): Secondary | ICD-10-CM | POA: Diagnosis not present

## 2022-07-17 DIAGNOSIS — I5042 Chronic combined systolic (congestive) and diastolic (congestive) heart failure: Secondary | ICD-10-CM | POA: Diagnosis not present

## 2022-07-20 DIAGNOSIS — E785 Hyperlipidemia, unspecified: Secondary | ICD-10-CM | POA: Diagnosis not present

## 2022-07-20 DIAGNOSIS — R0989 Other specified symptoms and signs involving the circulatory and respiratory systems: Secondary | ICD-10-CM | POA: Diagnosis not present

## 2022-07-20 DIAGNOSIS — R1312 Dysphagia, oropharyngeal phase: Secondary | ICD-10-CM | POA: Diagnosis not present

## 2022-07-20 DIAGNOSIS — E119 Type 2 diabetes mellitus without complications: Secondary | ICD-10-CM | POA: Diagnosis not present

## 2022-07-20 DIAGNOSIS — I480 Paroxysmal atrial fibrillation: Secondary | ICD-10-CM | POA: Diagnosis not present

## 2022-07-20 DIAGNOSIS — R058 Other specified cough: Secondary | ICD-10-CM | POA: Diagnosis not present

## 2022-07-20 DIAGNOSIS — I110 Hypertensive heart disease with heart failure: Secondary | ICD-10-CM | POA: Diagnosis not present

## 2022-07-20 DIAGNOSIS — M6281 Muscle weakness (generalized): Secondary | ICD-10-CM | POA: Diagnosis not present

## 2022-07-20 DIAGNOSIS — R278 Other lack of coordination: Secondary | ICD-10-CM | POA: Diagnosis not present

## 2022-07-21 DIAGNOSIS — R1312 Dysphagia, oropharyngeal phase: Secondary | ICD-10-CM | POA: Diagnosis not present

## 2022-07-21 DIAGNOSIS — M6281 Muscle weakness (generalized): Secondary | ICD-10-CM | POA: Diagnosis not present

## 2022-07-21 DIAGNOSIS — D72829 Elevated white blood cell count, unspecified: Secondary | ICD-10-CM | POA: Diagnosis not present

## 2022-07-21 DIAGNOSIS — R278 Other lack of coordination: Secondary | ICD-10-CM | POA: Diagnosis not present

## 2022-07-21 DIAGNOSIS — J9811 Atelectasis: Secondary | ICD-10-CM | POA: Diagnosis not present

## 2022-07-21 DIAGNOSIS — E119 Type 2 diabetes mellitus without complications: Secondary | ICD-10-CM | POA: Diagnosis not present

## 2022-07-22 DIAGNOSIS — R278 Other lack of coordination: Secondary | ICD-10-CM | POA: Diagnosis not present

## 2022-07-22 DIAGNOSIS — M6281 Muscle weakness (generalized): Secondary | ICD-10-CM | POA: Diagnosis not present

## 2022-07-22 DIAGNOSIS — R1312 Dysphagia, oropharyngeal phase: Secondary | ICD-10-CM | POA: Diagnosis not present

## 2022-07-23 DIAGNOSIS — K5900 Constipation, unspecified: Secondary | ICD-10-CM | POA: Diagnosis not present

## 2022-07-23 DIAGNOSIS — I69354 Hemiplegia and hemiparesis following cerebral infarction affecting left non-dominant side: Secondary | ICD-10-CM | POA: Diagnosis not present

## 2022-07-23 DIAGNOSIS — G8929 Other chronic pain: Secondary | ICD-10-CM | POA: Diagnosis not present

## 2022-07-23 DIAGNOSIS — M159 Polyosteoarthritis, unspecified: Secondary | ICD-10-CM | POA: Diagnosis not present

## 2022-07-24 DIAGNOSIS — R1312 Dysphagia, oropharyngeal phase: Secondary | ICD-10-CM | POA: Diagnosis not present

## 2022-07-24 DIAGNOSIS — E785 Hyperlipidemia, unspecified: Secondary | ICD-10-CM | POA: Diagnosis not present

## 2022-07-24 DIAGNOSIS — M6281 Muscle weakness (generalized): Secondary | ICD-10-CM | POA: Diagnosis not present

## 2022-07-24 DIAGNOSIS — R278 Other lack of coordination: Secondary | ICD-10-CM | POA: Diagnosis not present

## 2022-07-27 DIAGNOSIS — N390 Urinary tract infection, site not specified: Secondary | ICD-10-CM | POA: Diagnosis not present

## 2022-07-27 DIAGNOSIS — R278 Other lack of coordination: Secondary | ICD-10-CM | POA: Diagnosis not present

## 2022-07-27 DIAGNOSIS — R1312 Dysphagia, oropharyngeal phase: Secondary | ICD-10-CM | POA: Diagnosis not present

## 2022-07-27 DIAGNOSIS — M6281 Muscle weakness (generalized): Secondary | ICD-10-CM | POA: Diagnosis not present

## 2022-07-28 DIAGNOSIS — Z0189 Encounter for other specified special examinations: Secondary | ICD-10-CM | POA: Diagnosis not present

## 2022-07-28 DIAGNOSIS — L308 Other specified dermatitis: Secondary | ICD-10-CM | POA: Diagnosis not present

## 2022-07-28 DIAGNOSIS — Z515 Encounter for palliative care: Secondary | ICD-10-CM | POA: Diagnosis not present

## 2022-07-28 DIAGNOSIS — R21 Rash and other nonspecific skin eruption: Secondary | ICD-10-CM | POA: Diagnosis not present

## 2022-07-28 DIAGNOSIS — G8929 Other chronic pain: Secondary | ICD-10-CM | POA: Diagnosis not present

## 2022-07-29 DIAGNOSIS — R1312 Dysphagia, oropharyngeal phase: Secondary | ICD-10-CM | POA: Diagnosis not present

## 2022-07-29 DIAGNOSIS — R278 Other lack of coordination: Secondary | ICD-10-CM | POA: Diagnosis not present

## 2022-07-29 DIAGNOSIS — M6281 Muscle weakness (generalized): Secondary | ICD-10-CM | POA: Diagnosis not present

## 2022-07-30 DIAGNOSIS — M6281 Muscle weakness (generalized): Secondary | ICD-10-CM | POA: Diagnosis not present

## 2022-07-30 DIAGNOSIS — R278 Other lack of coordination: Secondary | ICD-10-CM | POA: Diagnosis not present

## 2022-07-30 DIAGNOSIS — R1312 Dysphagia, oropharyngeal phase: Secondary | ICD-10-CM | POA: Diagnosis not present

## 2022-07-31 DIAGNOSIS — R278 Other lack of coordination: Secondary | ICD-10-CM | POA: Diagnosis not present

## 2022-07-31 DIAGNOSIS — M6281 Muscle weakness (generalized): Secondary | ICD-10-CM | POA: Diagnosis not present

## 2022-07-31 DIAGNOSIS — R1312 Dysphagia, oropharyngeal phase: Secondary | ICD-10-CM | POA: Diagnosis not present

## 2022-08-01 ENCOUNTER — Encounter: Admit: 2022-08-01 | Discharge: 2022-08-01

## 2022-08-01 DIAGNOSIS — T1490XA Injury, unspecified, initial encounter: Secondary | ICD-10-CM

## 2022-08-01 MED ORDER — ACETAMINOPHEN 1,000 MG/100 ML (10 MG/ML) IV SOLN
1000 mg | INTRAVENOUS | 0 refills | Status: AC
Start: 2022-08-01 — End: ?
  Administered 2022-08-02 (×2): 1000 mg via INTRAVENOUS

## 2022-08-01 MED ORDER — LEVETIRACETAM 500 MG/5 ML IV SOLN
500 mg | Freq: Two times a day (BID) | INTRAVENOUS | 0 refills | Status: AC
Start: 2022-08-01 — End: ?
  Administered 2022-08-02 – 2022-08-04 (×6): 500 mg via INTRAVENOUS

## 2022-08-01 MED ORDER — ONDANSETRON HCL (PF) 4 MG/2 ML IJ SOLN
4 mg | INTRAVENOUS | 0 refills | Status: AC | PRN
Start: 2022-08-01 — End: ?

## 2022-08-02 ENCOUNTER — Encounter: Admit: 2022-08-02 | Discharge: 2022-08-02 | Payer: TRICARE (CHAMPUS)

## 2022-08-02 ENCOUNTER — Ambulatory Visit: Admit: 2022-08-02 | Discharge: 2022-08-01

## 2022-08-02 ENCOUNTER — Inpatient Hospital Stay: Admit: 2022-08-02 | Discharge: 2022-08-02 | Payer: TRICARE (CHAMPUS)

## 2022-08-02 ENCOUNTER — Inpatient Hospital Stay: Admit: 2022-08-02

## 2022-08-02 LAB — IONIZED CALCIUM: IONIZED CALCIUM: 1 MMOL/L (ref 1.0–1.3)

## 2022-08-02 MED ADMIN — ONDANSETRON HCL (PF) 4 MG/2 ML IJ SOLN [136012]: 4 mg | INTRAVENOUS | @ 05:00:00 | Stop: 2022-08-02 | NDC 60505613000

## 2022-08-02 MED ADMIN — SODIUM CHLORIDE 0.9 % IJ SOLN [7319]: 50 mL | INTRAVENOUS | @ 13:00:00 | Stop: 2022-08-02 | NDC 00409488820

## 2022-08-02 MED ADMIN — IOHEXOL 350 MG IODINE/ML IV SOLN [81210]: 100 mL | INTRAVENOUS | @ 07:00:00 | Stop: 2022-08-02 | NDC 00407141491

## 2022-08-02 MED ADMIN — IOHEXOL 350 MG IODINE/ML IV SOLN [81210]: 80 mL | INTRAVENOUS | @ 13:00:00 | Stop: 2022-08-02 | NDC 00407141491

## 2022-08-02 MED ADMIN — SODIUM CHLORIDE 0.9 % IJ SOLN [7319]: 50 mL | INTRAVENOUS | @ 07:00:00 | Stop: 2022-08-02 | NDC 00409488820

## 2022-08-03 ENCOUNTER — Inpatient Hospital Stay: Admit: 2022-08-03 | Discharge: 2022-08-03 | Payer: MEDICARE

## 2022-08-03 ENCOUNTER — Encounter: Admit: 2022-08-03 | Discharge: 2022-08-03 | Payer: MEDICARE

## 2022-08-03 DIAGNOSIS — M6281 Muscle weakness (generalized): Secondary | ICD-10-CM | POA: Diagnosis not present

## 2022-08-03 DIAGNOSIS — R1312 Dysphagia, oropharyngeal phase: Secondary | ICD-10-CM | POA: Diagnosis not present

## 2022-08-03 DIAGNOSIS — R278 Other lack of coordination: Secondary | ICD-10-CM | POA: Diagnosis not present

## 2022-08-03 MED ADMIN — HALOPERIDOL LACTATE 5 MG/ML IJ SOLN [3584]: 2.5 mg | INTRAMUSCULAR | @ 09:00:00 | Stop: 2022-08-03 | NDC 63323047400

## 2022-08-03 MED ADMIN — HALOPERIDOL LACTATE 5 MG/ML IJ SOLN [3584]: 2.5 mg | INTRAVENOUS | @ 07:00:00 | Stop: 2022-08-03 | NDC 63323047400

## 2022-08-03 MED ADMIN — ACETAMINOPHEN 1,000 MG/100 ML (10 MG/ML) IV SOLN [305632]: 1000 mg | INTRAVENOUS | @ 07:00:00 | Stop: 2022-08-03 | NDC 00264410090

## 2022-08-03 MED ADMIN — ACETAMINOPHEN 325 MG PO TAB [101]: 650 mg | NASOGASTRIC | @ 22:00:00 | NDC 00904677361

## 2022-08-03 MED ADMIN — HALOPERIDOL LACTATE 5 MG/ML IJ SOLN [3584]: 5 mg | INTRAVENOUS | @ 23:00:00 | Stop: 2022-08-03 | NDC 63323047400

## 2022-08-03 MED ADMIN — LACTATED RINGERS IV SOLP [4318]: 1000 mL | INTRAVENOUS | @ 07:00:00 | Stop: 2022-08-03 | NDC 00338011704

## 2022-08-03 MED ADMIN — ENOXAPARIN 30 MG/0.3 ML SC SYRG [85048]: 30 mg | SUBCUTANEOUS | @ 13:00:00 | NDC 00781323801

## 2022-08-04 ENCOUNTER — Encounter: Admit: 2022-08-04 | Discharge: 2022-08-04 | Payer: MEDICARE

## 2022-08-04 ENCOUNTER — Inpatient Hospital Stay: Admit: 2022-08-04 | Discharge: 2022-08-04 | Payer: MEDICARE

## 2022-08-04 DIAGNOSIS — R278 Other lack of coordination: Secondary | ICD-10-CM | POA: Diagnosis not present

## 2022-08-04 DIAGNOSIS — M6281 Muscle weakness (generalized): Secondary | ICD-10-CM | POA: Diagnosis not present

## 2022-08-04 DIAGNOSIS — R1312 Dysphagia, oropharyngeal phase: Secondary | ICD-10-CM | POA: Diagnosis not present

## 2022-08-04 MED ADMIN — ENOXAPARIN 30 MG/0.3 ML SC SYRG [85048]: 30 mg | SUBCUTANEOUS | @ 01:00:00 | NDC 00781323801

## 2022-08-04 MED ADMIN — POTASSIUM CHLORIDE 20 MEQ PO TBTQ [35943]: 30 meq | ORAL | @ 13:00:00 | Stop: 2022-08-04 | NDC 00832532510

## 2022-08-04 MED ADMIN — TRAZODONE 50 MG PO TAB [8085]: 50 mg | NASOGASTRIC | @ 01:00:00 | NDC 00904686861

## 2022-08-04 MED ADMIN — ACETAMINOPHEN 325 MG PO TAB [101]: 650 mg | ORAL | @ 15:00:00 | Stop: 2022-08-04 | NDC 00904677361

## 2022-08-04 MED ADMIN — ACETAMINOPHEN 325 MG PO TAB [101]: 650 mg | NASOGASTRIC | @ 03:00:00 | NDC 00904677361

## 2022-08-04 MED ADMIN — ACETAMINOPHEN 325 MG PO TAB [101]: 650 mg | NASOGASTRIC | @ 09:00:00 | Stop: 2022-08-04 | NDC 00904677361

## 2022-08-04 MED ADMIN — ACETAMINOPHEN 500 MG PO TAB [102]: 650 mg | ORAL | @ 22:00:00 | Stop: 2022-08-04 | NDC 00904673061

## 2022-08-04 MED ADMIN — OLANZAPINE 5 MG PO TAB [78457]: 2.5 mg | ORAL | @ 17:00:00 | NDC 00904637761

## 2022-08-04 MED ADMIN — ROSUVASTATIN 20 MG PO TAB [88504]: 40 mg | NASOGASTRIC | @ 13:00:00 | Stop: 2022-08-04 | NDC 68462026390

## 2022-08-04 MED ADMIN — FINASTERIDE 5 MG PO TAB [82679]: 5 mg | ORAL | @ 13:00:00 | NDC 00904683061

## 2022-08-04 MED ADMIN — ENOXAPARIN 30 MG/0.3 ML SC SYRG [85048]: 30 mg | SUBCUTANEOUS | @ 13:00:00 | NDC 00781323801

## 2022-08-04 MED ADMIN — ROPINIROLE 0.5 MG PO TAB [80596]: 3 mg | ORAL | @ 01:00:00 | NDC 43547026910

## 2022-08-04 MED ADMIN — LEVOTHYROXINE 50 MCG PO TAB [4421]: 100 ug | NASOGASTRIC | @ 11:00:00 | NDC 00904695061

## 2022-08-04 MED ADMIN — ACETAMINOPHEN 325 MG PO TAB [101]: 650 mg | NASOGASTRIC | @ 15:00:00 | Stop: 2022-08-04 | NDC 00904677361

## 2022-08-04 MED ADMIN — SENNOSIDES 8.6 MG PO TAB [11349]: 1 | NASOGASTRIC | @ 13:00:00 | Stop: 2022-08-04 | NDC 00904725261

## 2022-08-04 MED ADMIN — ROPINIROLE 0.5 MG PO TAB [80596]: 3 mg | ORAL | @ 13:00:00 | NDC 43547026910

## 2022-08-04 MED ADMIN — POLYETHYLENE GLYCOL 3350 17 GRAM PO PWPK [25424]: 17 g | NASOGASTRIC | @ 13:00:00 | Stop: 2022-08-04 | NDC 00904693186

## 2022-08-04 MED ADMIN — MELATONIN 5 MG PO TAB [168576]: 5 mg | ORAL | @ 01:00:00 | NDC 77333052025

## 2022-08-04 MED ADMIN — SENNOSIDES 8.6 MG PO TAB [11349]: 1 | NASOGASTRIC | @ 01:00:00 | NDC 00904725261

## 2022-08-04 MED ADMIN — TRAZODONE 50 MG PO TAB [8085]: 25 mg | ORAL | @ 22:00:00 | NDC 00904686861

## 2022-08-05 ENCOUNTER — Inpatient Hospital Stay: Admit: 2022-08-05 | Discharge: 2022-08-05 | Payer: MEDICARE

## 2022-08-05 ENCOUNTER — Encounter: Admit: 2022-08-05 | Discharge: 2022-08-05 | Payer: MEDICARE

## 2022-08-05 DIAGNOSIS — R1312 Dysphagia, oropharyngeal phase: Secondary | ICD-10-CM | POA: Diagnosis not present

## 2022-08-05 DIAGNOSIS — R278 Other lack of coordination: Secondary | ICD-10-CM | POA: Diagnosis not present

## 2022-08-05 DIAGNOSIS — M6281 Muscle weakness (generalized): Secondary | ICD-10-CM | POA: Diagnosis not present

## 2022-08-05 MED ADMIN — LEVETIRACETAM 100 MG/ML PO SOLN [88634]: 500 mg | NASOGASTRIC | @ 01:00:00 | Stop: 2022-08-09 | NDC 00904726541

## 2022-08-05 MED ADMIN — ACETAMINOPHEN 325 MG PO TAB [101]: 650 mg | NASOGASTRIC | @ 16:00:00 | NDC 00904677361

## 2022-08-05 MED ADMIN — TRAZODONE 50 MG PO TAB [8085]: 25 mg | ORAL | @ 01:00:00 | NDC 00904686861

## 2022-08-05 MED ADMIN — ENOXAPARIN 30 MG/0.3 ML SC SYRG [85048]: 30 mg | SUBCUTANEOUS | @ 14:00:00 | NDC 00781323801

## 2022-08-05 MED ADMIN — ACETAMINOPHEN 325 MG PO TAB [101]: 650 mg | NASOGASTRIC | @ 02:00:00 | NDC 00904677361

## 2022-08-05 MED ADMIN — SENNOSIDES 8.6 MG PO TAB [11349]: 1 | ORAL | @ 14:00:00 | Stop: 2022-08-05 | NDC 00904725261

## 2022-08-05 MED ADMIN — BARIUM SULFATE 81 % (W/W) PO POWD [174091]: 10 mL | ORAL | @ 20:00:00 | Stop: 2022-08-05 | NDC 32909010510

## 2022-08-05 MED ADMIN — ROSUVASTATIN 20 MG PO TAB [88504]: 40 mg | ORAL | @ 14:00:00 | Stop: 2022-08-05 | NDC 68462026390

## 2022-08-05 MED ADMIN — LEVOTHYROXINE 50 MCG PO TAB [4421]: 100 ug | NASOGASTRIC | @ 11:00:00 | NDC 00904695061

## 2022-08-05 MED ADMIN — FINASTERIDE 5 MG PO TAB [82679]: 5 mg | ORAL | @ 14:00:00 | Stop: 2022-08-05 | NDC 00904683061

## 2022-08-05 MED ADMIN — ROPINIROLE 0.5 MG PO TAB [80596]: 3 mg | ORAL | @ 01:00:00 | NDC 43547026910

## 2022-08-05 MED ADMIN — SENNOSIDES 8.6 MG PO TAB [11349]: 1 | ORAL | @ 01:00:00 | NDC 00904725261

## 2022-08-05 MED ADMIN — PHENOL 1.4 % MM SPRA [82893]: 2 | OROMUCOSAL | @ 12:00:00 | NDC 00904630521

## 2022-08-05 MED ADMIN — LEVETIRACETAM 100 MG/ML PO SOLN [88634]: 500 mg | NASOGASTRIC | @ 14:00:00 | Stop: 2022-08-09 | NDC 00904726541

## 2022-08-05 MED ADMIN — MELATONIN 5 MG PO TAB [168576]: 5 mg | ORAL | @ 01:00:00 | NDC 77333052025

## 2022-08-05 MED ADMIN — ROPINIROLE 0.5 MG PO TAB [80596]: 3 mg | ORAL | @ 14:00:00 | Stop: 2022-08-05 | NDC 43547026910

## 2022-08-05 MED ADMIN — ENOXAPARIN 30 MG/0.3 ML SC SYRG [85048]: 30 mg | SUBCUTANEOUS | @ 01:00:00 | NDC 00781323801

## 2022-08-05 MED ADMIN — BARIUM SULFATE 40 % (W/V), 30% (W/W) PO PSTE [173397]: 10 mL | ORAL | @ 20:00:00 | Stop: 2022-08-05 | NDC 32909012522

## 2022-08-05 MED ADMIN — POTASSIUM PHOSPHATE, MONOBASIC 500 MG PO TBSO [85136]: 2 | ORAL | @ 11:00:00 | Stop: 2022-08-05 | NDC 39328000810

## 2022-08-05 MED ADMIN — ACETAMINOPHEN 325 MG PO TAB [101]: 650 mg | NASOGASTRIC | @ 09:00:00 | NDC 00904677361

## 2022-08-05 MED ADMIN — BARIUM SULFATE 40 % (W/V) PO SUSP [95015]: 10 mL | ORAL | @ 20:00:00 | Stop: 2022-08-05 | NDC 32909011500

## 2022-08-05 MED ADMIN — POLYETHYLENE GLYCOL 3350 17 GRAM PO PWPK [25424]: 17 g | ORAL | @ 14:00:00 | Stop: 2022-08-05 | NDC 00904693186

## 2022-08-06 DIAGNOSIS — R278 Other lack of coordination: Secondary | ICD-10-CM | POA: Diagnosis not present

## 2022-08-06 DIAGNOSIS — M6281 Muscle weakness (generalized): Secondary | ICD-10-CM | POA: Diagnosis not present

## 2022-08-06 DIAGNOSIS — R1312 Dysphagia, oropharyngeal phase: Secondary | ICD-10-CM | POA: Diagnosis not present

## 2022-08-06 MED ADMIN — TRAZODONE 50 MG PO TAB [8085]: 25 mg | GASTROSTOMY | @ 02:00:00 | NDC 00904686861

## 2022-08-06 MED ADMIN — ACETAMINOPHEN 325 MG PO TAB [101]: 650 mg | NASOGASTRIC | @ 02:00:00 | NDC 00904677361

## 2022-08-06 MED ADMIN — ACETAMINOPHEN 325 MG PO TAB [101]: 650 mg | NASOGASTRIC | @ 09:00:00 | Stop: 2022-08-06 | NDC 00904677361

## 2022-08-06 MED ADMIN — POLYETHYLENE GLYCOL 3350 17 GRAM PO PWPK [25424]: 17 g | GASTROSTOMY | @ 14:00:00 | Stop: 2022-08-06 | NDC 00904693186

## 2022-08-06 MED ADMIN — SENNOSIDES 8.6 MG PO TAB [11349]: 1 | GASTROSTOMY | @ 02:00:00 | NDC 00904725261

## 2022-08-06 MED ADMIN — ENOXAPARIN 30 MG/0.3 ML SC SYRG [85048]: 30 mg | SUBCUTANEOUS | @ 02:00:00 | NDC 00781323801

## 2022-08-06 MED ADMIN — ACETAMINOPHEN 325 MG PO TAB [101]: 650 mg | NASOGASTRIC | @ 21:00:00 | Stop: 2022-08-06 | NDC 00904677361

## 2022-08-06 MED ADMIN — LEVETIRACETAM 100 MG/ML PO SOLN [88634]: 500 mg | NASOGASTRIC | @ 02:00:00 | Stop: 2022-08-09 | NDC 00904726541

## 2022-08-06 MED ADMIN — POTASSIUM PHOSPHATE, MONOBASIC 500 MG PO TBSO [85136]: 2 | ORAL | @ 11:00:00 | Stop: 2022-08-06 | NDC 39328000810

## 2022-08-06 MED ADMIN — ENOXAPARIN 30 MG/0.3 ML SC SYRG [85048]: 30 mg | SUBCUTANEOUS | @ 14:00:00 | NDC 00781323801

## 2022-08-06 MED ADMIN — SENNOSIDES 8.6 MG PO TAB [11349]: 1 | GASTROSTOMY | @ 14:00:00 | Stop: 2022-08-06 | NDC 00904725261

## 2022-08-06 MED ADMIN — ACETAMINOPHEN 325 MG PO TAB [101]: 650 mg | NASOGASTRIC | @ 15:00:00 | Stop: 2022-08-06 | NDC 00904677361

## 2022-08-06 MED ADMIN — LEVETIRACETAM 100 MG/ML PO SOLN [88634]: 500 mg | NASOGASTRIC | @ 14:00:00 | Stop: 2022-08-06 | NDC 00904726541

## 2022-08-06 MED ADMIN — LEVOTHYROXINE 50 MCG PO TAB [4421]: 100 ug | NASOGASTRIC | @ 11:00:00 | Stop: 2022-08-06 | NDC 00904695061

## 2022-08-06 MED ADMIN — MELATONIN 5 MG PO TAB [168576]: 5 mg | GASTROSTOMY | @ 02:00:00 | NDC 77333052025

## 2022-08-06 MED ADMIN — FINASTERIDE 5 MG PO TAB [82679]: 5 mg | GASTROSTOMY | @ 14:00:00 | Stop: 2022-08-06 | NDC 00904683061

## 2022-08-06 MED ADMIN — ROPINIROLE 0.5 MG PO TAB [80596]: 3 mg | NASOGASTRIC | @ 14:00:00 | Stop: 2022-08-06 | NDC 43547026910

## 2022-08-06 MED ADMIN — TIMOLOL MALEATE 0.25 % OP DROP [11561]: 1 [drp] | OPHTHALMIC | @ 03:00:00 | NDC 61314022605

## 2022-08-06 MED ADMIN — POTASSIUM CHLORIDE 20 MEQ/15 ML PO LIQD [6432]: 20 meq | ORAL | @ 11:00:00 | Stop: 2022-08-06 | NDC 71656002115

## 2022-08-07 DIAGNOSIS — R1312 Dysphagia, oropharyngeal phase: Secondary | ICD-10-CM | POA: Diagnosis not present

## 2022-08-07 DIAGNOSIS — R278 Other lack of coordination: Secondary | ICD-10-CM | POA: Diagnosis not present

## 2022-08-07 DIAGNOSIS — M6281 Muscle weakness (generalized): Secondary | ICD-10-CM | POA: Diagnosis not present

## 2022-08-07 MED ADMIN — SENNOSIDES 8.6 MG PO TAB [11349]: 1 | ORAL | @ 01:00:00 | NDC 00904725261

## 2022-08-07 MED ADMIN — FINASTERIDE 5 MG PO TAB [82679]: 5 mg | ORAL | @ 13:00:00 | Stop: 2022-08-08 | NDC 00904683061

## 2022-08-07 MED ADMIN — ENOXAPARIN 30 MG/0.3 ML SC SYRG [85048]: 30 mg | SUBCUTANEOUS | @ 13:00:00 | Stop: 2022-08-08 | NDC 00781323801

## 2022-08-07 MED ADMIN — POLYETHYLENE GLYCOL 3350 17 GRAM PO PWPK [25424]: 17 g | ORAL | @ 13:00:00 | Stop: 2022-08-08 | NDC 00904693186

## 2022-08-07 MED ADMIN — ACETAMINOPHEN 325 MG PO TAB [101]: 650 mg | ORAL | @ 03:00:00 | NDC 00904677361

## 2022-08-07 MED ADMIN — ROPINIROLE 0.5 MG PO TAB [80596]: 3 mg | ORAL | @ 13:00:00 | Stop: 2022-08-08 | NDC 43547026910

## 2022-08-07 MED ADMIN — SENNOSIDES 8.6 MG PO TAB [11349]: 1 | ORAL | @ 13:00:00 | Stop: 2022-08-08 | NDC 00904725261

## 2022-08-07 MED ADMIN — ACETAMINOPHEN 325 MG PO TAB [101]: 650 mg | ORAL | @ 20:00:00 | Stop: 2022-08-08 | NDC 00904677361

## 2022-08-07 MED ADMIN — ACETAMINOPHEN 325 MG PO TAB [101]: 650 mg | ORAL | @ 15:00:00 | Stop: 2022-08-08 | NDC 00904677361

## 2022-08-07 MED ADMIN — LEVETIRACETAM 100 MG/ML PO SOLN [88634]: 500 mg | ORAL | @ 13:00:00 | Stop: 2022-08-08 | NDC 00904726541

## 2022-08-07 MED ADMIN — TRAZODONE 50 MG PO TAB [8085]: 25 mg | ORAL | @ 01:00:00 | NDC 00904686861

## 2022-08-07 MED ADMIN — ACETAMINOPHEN 325 MG PO TAB [101]: 650 mg | ORAL | @ 09:00:00 | Stop: 2022-08-08 | NDC 00904677361

## 2022-08-07 MED ADMIN — ENOXAPARIN 30 MG/0.3 ML SC SYRG [85048]: 30 mg | SUBCUTANEOUS | @ 01:00:00 | NDC 00781323801

## 2022-08-07 MED ADMIN — ROPINIROLE 0.5 MG PO TAB [80596]: 3 mg | ORAL | @ 01:00:00 | NDC 43547026910

## 2022-08-07 MED ADMIN — LEVOTHYROXINE 50 MCG PO TAB [4421]: 100 ug | ORAL | @ 11:00:00 | Stop: 2022-08-08 | NDC 00904695061

## 2022-08-07 MED ADMIN — TAMSULOSIN 0.4 MG PO CAP [80077]: 0.4 mg | ORAL | @ 01:00:00 | NDC 00904738361

## 2022-08-07 MED ADMIN — LEVETIRACETAM 100 MG/ML PO SOLN [88634]: 500 mg | ORAL | @ 01:00:00 | Stop: 2022-08-09 | NDC 00904726541

## 2022-08-07 MED ADMIN — MELATONIN 5 MG PO TAB [168576]: 5 mg | ORAL | @ 01:00:00 | NDC 77333052025

## 2022-08-10 DIAGNOSIS — R8290 Unspecified abnormal findings in urine: Secondary | ICD-10-CM | POA: Diagnosis not present

## 2022-08-10 DIAGNOSIS — N390 Urinary tract infection, site not specified: Secondary | ICD-10-CM | POA: Diagnosis not present

## 2022-08-10 DIAGNOSIS — R1312 Dysphagia, oropharyngeal phase: Secondary | ICD-10-CM | POA: Diagnosis not present

## 2022-08-10 DIAGNOSIS — R278 Other lack of coordination: Secondary | ICD-10-CM | POA: Diagnosis not present

## 2022-08-10 DIAGNOSIS — R820 Chyluria: Secondary | ICD-10-CM | POA: Diagnosis not present

## 2022-08-10 DIAGNOSIS — M6281 Muscle weakness (generalized): Secondary | ICD-10-CM | POA: Diagnosis not present

## 2022-08-11 DIAGNOSIS — R278 Other lack of coordination: Secondary | ICD-10-CM | POA: Diagnosis not present

## 2022-08-11 DIAGNOSIS — Z931 Gastrostomy status: Secondary | ICD-10-CM | POA: Diagnosis not present

## 2022-08-11 DIAGNOSIS — M1990 Unspecified osteoarthritis, unspecified site: Secondary | ICD-10-CM | POA: Diagnosis not present

## 2022-08-11 DIAGNOSIS — G8929 Other chronic pain: Secondary | ICD-10-CM | POA: Diagnosis not present

## 2022-08-11 DIAGNOSIS — M6281 Muscle weakness (generalized): Secondary | ICD-10-CM | POA: Diagnosis not present

## 2022-08-11 DIAGNOSIS — R1312 Dysphagia, oropharyngeal phase: Secondary | ICD-10-CM | POA: Diagnosis not present

## 2022-08-12 ENCOUNTER — Encounter: Admit: 2022-08-12 | Discharge: 2022-08-12 | Payer: MEDICARE

## 2022-08-12 DIAGNOSIS — R278 Other lack of coordination: Secondary | ICD-10-CM | POA: Diagnosis not present

## 2022-08-12 DIAGNOSIS — R1312 Dysphagia, oropharyngeal phase: Secondary | ICD-10-CM | POA: Diagnosis not present

## 2022-08-12 DIAGNOSIS — M6281 Muscle weakness (generalized): Secondary | ICD-10-CM | POA: Diagnosis not present

## 2022-08-12 DIAGNOSIS — R296 Repeated falls: Secondary | ICD-10-CM

## 2022-08-12 DIAGNOSIS — S065XAA SDH (subdural hematoma) (HCC): Secondary | ICD-10-CM

## 2022-08-12 NOTE — Telephone Encounter
This RN while doing chart prep reached out to Dr Hyacinth Meeker regarding Brendan Acosta's upcoming apt.  Brendan Acosta will need a CT head without contrast and cervical flex/extension films prior to apt.  Also, Brendan Acosta only needs a 15 min time slot vs new patient, as Dr Hyacinth Meeker saw him while in patient.   This RN added orders and left message for family requesting return call.

## 2022-08-13 ENCOUNTER — Encounter: Admit: 2022-08-13 | Discharge: 2022-08-13 | Payer: MEDICARE

## 2022-08-13 DIAGNOSIS — G4720 Circadian rhythm sleep disorder, unspecified type: Secondary | ICD-10-CM | POA: Diagnosis not present

## 2022-08-13 DIAGNOSIS — R278 Other lack of coordination: Secondary | ICD-10-CM | POA: Diagnosis not present

## 2022-08-13 DIAGNOSIS — Z515 Encounter for palliative care: Secondary | ICD-10-CM | POA: Diagnosis not present

## 2022-08-13 DIAGNOSIS — R1312 Dysphagia, oropharyngeal phase: Secondary | ICD-10-CM | POA: Diagnosis not present

## 2022-08-13 DIAGNOSIS — M6281 Muscle weakness (generalized): Secondary | ICD-10-CM | POA: Diagnosis not present

## 2022-08-13 NOTE — Telephone Encounter
This RN called Erin, DON back after VM was left.  Erin verified name and DOB for [employer].    Denny Peon states that Matti has been having frequent episodes of 'spacing out' for a few seconds at a time.  Denny Peon reports that this has been happening often as much as ten times per hour.  Denny Peon reports that during these episodes, he is unresponsive and mouth hanging open.  After episode, he returns to baseline and is A//O x 4.   This RN reached out to Dr Sena Hitch on call and she advised local ED.   This RN returned call to [facility] and advised.  Also, this RN requested ED notes and any imaging be sent to [place].  This RN also let her know about current apt for CT and for f/u clinic visit on 09/10/22.   Erin agreed with plan of care and denied further questions.

## 2022-08-13 NOTE — Telephone Encounter
This RN received VM from Debra in response to VM that I left on Brendan Acosta's phone.    This RN returned call and Stanton Kidney verified pt name and DOB.   Stanton Kidney stated she is at ED with her father, [name] right now.   This RN let her know that I had called regarding apt scheduling and had already given the message to DON.    No further questions at this time.

## 2022-08-14 DIAGNOSIS — R278 Other lack of coordination: Secondary | ICD-10-CM | POA: Diagnosis not present

## 2022-08-14 DIAGNOSIS — M6281 Muscle weakness (generalized): Secondary | ICD-10-CM | POA: Diagnosis not present

## 2022-08-14 DIAGNOSIS — R1312 Dysphagia, oropharyngeal phase: Secondary | ICD-10-CM | POA: Diagnosis not present

## 2022-08-17 DIAGNOSIS — M6281 Muscle weakness (generalized): Secondary | ICD-10-CM | POA: Diagnosis not present

## 2022-08-17 DIAGNOSIS — N390 Urinary tract infection, site not specified: Secondary | ICD-10-CM | POA: Diagnosis not present

## 2022-08-17 DIAGNOSIS — R21 Rash and other nonspecific skin eruption: Secondary | ICD-10-CM | POA: Diagnosis not present

## 2022-08-18 DIAGNOSIS — E785 Hyperlipidemia, unspecified: Secondary | ICD-10-CM | POA: Diagnosis not present

## 2022-08-18 DIAGNOSIS — K922 Gastrointestinal hemorrhage, unspecified: Secondary | ICD-10-CM | POA: Diagnosis not present

## 2022-08-18 DIAGNOSIS — K5790 Diverticulosis of intestine, part unspecified, without perforation or abscess without bleeding: Secondary | ICD-10-CM | POA: Diagnosis not present

## 2022-08-18 DIAGNOSIS — K921 Melena: Secondary | ICD-10-CM | POA: Diagnosis not present

## 2022-08-19 DIAGNOSIS — M6281 Muscle weakness (generalized): Secondary | ICD-10-CM | POA: Diagnosis not present

## 2022-08-19 DIAGNOSIS — N390 Urinary tract infection, site not specified: Secondary | ICD-10-CM | POA: Diagnosis not present

## 2022-08-21 DIAGNOSIS — K922 Gastrointestinal hemorrhage, unspecified: Secondary | ICD-10-CM | POA: Diagnosis not present

## 2022-08-24 ENCOUNTER — Encounter: Admit: 2022-08-24 | Discharge: 2022-08-24 | Payer: MEDICARE

## 2022-08-24 DIAGNOSIS — M6281 Muscle weakness (generalized): Secondary | ICD-10-CM | POA: Diagnosis not present

## 2022-08-25 DIAGNOSIS — M6281 Muscle weakness (generalized): Secondary | ICD-10-CM | POA: Diagnosis not present

## 2022-08-25 NOTE — Progress Notes
At [facility] 6/15 - 6/19 after a fall from ground level at home for [facility], SDH 6/15 acute non displaced skull fx,   No midline shift or herniation.    R LE venous    Imaging: Ultrasound - RLE: Occlusive thrombus in CFV, superior femoral vein, popliteal vein, posterior tibial vein and greater saphenous vein.    Faint R LE pulses.    VS: 119/71 979 HR 71 RR 17 - 96% on RA.      Meds given:  None    Labs:  WBC 12.9  Bun 31  PT 11.1 INR 1.03    Can he be anticoagulated after head bleed?    Reason for transfer:  Higher level of care/continuity of care.

## 2022-08-26 DIAGNOSIS — M6281 Muscle weakness (generalized): Secondary | ICD-10-CM | POA: Diagnosis not present

## 2022-08-27 DIAGNOSIS — K5790 Diverticulosis of intestine, part unspecified, without perforation or abscess without bleeding: Secondary | ICD-10-CM | POA: Diagnosis not present

## 2022-08-27 DIAGNOSIS — K921 Melena: Secondary | ICD-10-CM | POA: Diagnosis not present

## 2022-08-27 DIAGNOSIS — W19XXXA Unspecified fall, initial encounter: Secondary | ICD-10-CM | POA: Diagnosis not present

## 2022-08-28 ENCOUNTER — Encounter: Admit: 2022-08-28 | Discharge: 2022-08-28 | Payer: MEDICARE

## 2022-08-28 DIAGNOSIS — I5042 Chronic combined systolic (congestive) and diastolic (congestive) heart failure: Secondary | ICD-10-CM | POA: Diagnosis not present

## 2022-08-28 DIAGNOSIS — I110 Hypertensive heart disease with heart failure: Secondary | ICD-10-CM | POA: Diagnosis not present

## 2022-08-31 DIAGNOSIS — M6281 Muscle weakness (generalized): Secondary | ICD-10-CM | POA: Diagnosis not present

## 2022-09-01 ENCOUNTER — Encounter: Admit: 2022-09-01 | Discharge: 2022-09-01 | Payer: MEDICARE

## 2022-09-01 DIAGNOSIS — K5900 Constipation, unspecified: Secondary | ICD-10-CM | POA: Diagnosis not present

## 2022-09-01 DIAGNOSIS — G8929 Other chronic pain: Secondary | ICD-10-CM | POA: Diagnosis not present

## 2022-09-01 DIAGNOSIS — M159 Polyosteoarthritis, unspecified: Secondary | ICD-10-CM | POA: Diagnosis not present

## 2022-09-01 DIAGNOSIS — I69354 Hemiplegia and hemiparesis following cerebral infarction affecting left non-dominant side: Secondary | ICD-10-CM | POA: Diagnosis not present

## 2022-09-01 DIAGNOSIS — M6281 Muscle weakness (generalized): Secondary | ICD-10-CM | POA: Diagnosis not present

## 2022-09-01 NOTE — Telephone Encounter
Verified 2 patient identifiers    This RN returned patients daughters phone call. Patients daughter notified this RN of the following:     Patient was discharged today 7/16 from [facility]. Dr. Windell Moment (NSGY) at [facility] would like the patient to have a CT Head scan completed 7-10 days from when the blood thinners were started back which was on 08/28/2022. Daughter also notified this RN that staff at [facility] recommended follow up scans of abd/pelvis/lower legs to check on blood clots.     This RN explained to patients daughter that if Dr. Windell Moment wants the [facility] scan sooner that needs to be ordered and scheduled by his team, but this RN will make Dr. Hyacinth Meeker aware of what Dr. Jaye Beagle recommends. As for the other scans the providers at [facility] that are recommending the scans need to order and schedule the follow up or patients primary care provider can do it. This RN will notify Amy, Dr. Garen Lah primary, RN.

## 2022-09-02 DIAGNOSIS — M6281 Muscle weakness (generalized): Secondary | ICD-10-CM | POA: Diagnosis not present

## 2022-09-04 DIAGNOSIS — F01C0 Vascular dementia, severe, without behavioral disturbance, psychotic disturbance, mood disturbance, and anxiety: Secondary | ICD-10-CM | POA: Diagnosis not present

## 2022-09-04 DIAGNOSIS — I69811 Memory deficit following other cerebrovascular disease: Secondary | ICD-10-CM | POA: Diagnosis not present

## 2022-09-07 DIAGNOSIS — M6281 Muscle weakness (generalized): Secondary | ICD-10-CM | POA: Diagnosis not present

## 2022-09-08 DIAGNOSIS — B351 Tinea unguium: Secondary | ICD-10-CM | POA: Diagnosis not present

## 2022-09-08 DIAGNOSIS — Z8719 Personal history of other diseases of the digestive system: Secondary | ICD-10-CM | POA: Diagnosis not present

## 2022-09-08 DIAGNOSIS — H6120 Impacted cerumen, unspecified ear: Secondary | ICD-10-CM | POA: Diagnosis not present

## 2022-09-08 DIAGNOSIS — L603 Nail dystrophy: Secondary | ICD-10-CM | POA: Diagnosis not present

## 2022-09-08 DIAGNOSIS — K5909 Other constipation: Secondary | ICD-10-CM | POA: Diagnosis not present

## 2022-09-08 DIAGNOSIS — M6281 Muscle weakness (generalized): Secondary | ICD-10-CM | POA: Diagnosis not present

## 2022-09-08 DIAGNOSIS — L600 Ingrowing nail: Secondary | ICD-10-CM | POA: Diagnosis not present

## 2022-09-09 ENCOUNTER — Encounter: Admit: 2022-09-09 | Discharge: 2022-09-09 | Payer: MEDICARE

## 2022-09-09 DIAGNOSIS — M6281 Muscle weakness (generalized): Secondary | ICD-10-CM | POA: Diagnosis not present

## 2022-09-10 ENCOUNTER — Encounter: Admit: 2022-09-10 | Discharge: 2022-09-10 | Payer: MEDICARE

## 2022-09-10 ENCOUNTER — Ambulatory Visit: Admit: 2022-09-10 | Discharge: 2022-09-10 | Payer: MEDICARE

## 2022-09-10 DIAGNOSIS — H906 Mixed conductive and sensorineural hearing loss, bilateral: Secondary | ICD-10-CM | POA: Diagnosis not present

## 2022-09-10 DIAGNOSIS — S065XAA SDH (subdural hematoma) (HCC): Secondary | ICD-10-CM

## 2022-09-10 DIAGNOSIS — E079 Disorder of thyroid, unspecified: Secondary | ICD-10-CM

## 2022-09-10 NOTE — Progress Notes
Date of Service: 09/10/2022    Subjective:          History obtained from    Brendan Acosta is a 89 y.o. male.    History of Present Illness    The patient has been to [facility] since being at her hospital for DVT.  At the time his cervical collar was cleared.  [facility] has resided at [facility] since the time of surgery.  He is having issues with intermittent speech difficulty.  Where he is able to understand but has difficulty forming sentences and often has garbled speech.    Objective:          acetaminophen (TYLENOL EXTRA STRENGTH) 500 mg tablet Take one tablet by mouth every 4 hours as needed for Pain.    acetaminophen-codeine (TYLENOL-CODEINE #3) 300-30 mg tablet Take one tablet by mouth every 8 hours as needed for Pain.    dextran 70-hypromellose (PF) (ARTIFICIAL TEARS (PF)) 0.1/0.3 % ophthalmic solution Apply two drops to three drops to both eyes daily as needed for Dry Eyes.    diclofenac sodium (VOLTAREN) 1 % topical gel Apply four g topically to affected area daily as needed.    finasteride (PROSCAR) 5 mg tablet Take one tablet by mouth daily.    gabapentin (NEURONTIN) 300 mg capsule Take one capsule by mouth daily.    ketoconazole (NIZORAL) 2 % topical shampoo APPLY TO SCALP AND EARS FOR 5 MINUTES IN SHOWER THEN RINSE 2 TO 3 TIMES A WEEK AS NEEDED FOR FLARES    levothyroxine (SYNTHROID) 100 mcg tablet Take one tablet by mouth daily 30 minutes before breakfast.    other medication one Dose daily as needed. Medication Name & Strength (List each active ingredient & strength): [employer]  Dose (how much): 1 scoop  Route: by mouth  Frequency (how often): daily as needed    polyethylene glycol 3350 (MIRALAX) 17 g packet Take one packet by mouth daily as needed.    rivaroxaban (XARELTO) 10 mg tablet Take one tablet by mouth daily with breakfast.    rOPINIRole (REQUIP) 3 mg tablet Take one tablet by mouth twice daily.    tamsulosin (FLOMAX) 0.4 mg capsule Take one capsule by mouth at bedtime daily. Do not crush, chew or open capsules. Take 30 minutes following the same meal each day.  Indications: enlarged prostate with urination problem    timolol maleate (TIMOPTIC) 0.25 % ophthalmic solution Apply one drop to both eyes twice daily.    ZIOPTAN (PF) 0.0015 % ophthalmic drop Apply one drop to both eyes daily.     Vitals:    09/10/22 1518   BP: 92/51   BP Source: Arm, Right Upper   Pulse: 67   Temp: 36.2 ?C (97.2 ?F)   Resp: 18   SpO2: 100%   TempSrc: Skin   PainSc: Zero   Weight: 72.6 kg (160 lb)   Height: 179.1 cm (5' 10.5)     Body mass index is 22.63 kg/m?Marland Kitchen     Physical Exam  Difficulty with speech production.  Multiple episodes of lost words.  Comprehension appears intact    Imaging:  CT demonstrating appropriate progression and reabsorption of subdural hematoma without additional midline shift       Assessment and Plan:  Brendan Acosta is an 89 year old male who presented to [facility] for an acute subdural hematoma.  Since his visit he has been put on Xarelto for a DVT.  Today in clinic he was demonstrating difficulty with speech  problems.  Based on his speech issues related likely to the subdural hematoma as well as need for Xarelto I am recommending the patient undergo a middle meningeal artery embolization.  I am referring the patient to one of my partners to undergo discussion and scheduling this procedure.

## 2022-09-11 ENCOUNTER — Encounter: Admit: 2022-09-11 | Discharge: 2022-09-11 | Payer: MEDICARE

## 2022-09-11 ENCOUNTER — Ambulatory Visit: Admit: 2022-09-11 | Discharge: 2022-09-12 | Payer: MEDICARE

## 2022-09-11 DIAGNOSIS — E079 Disorder of thyroid, unspecified: Secondary | ICD-10-CM

## 2022-09-11 DIAGNOSIS — S065XAA SDH (subdural hematoma) (HCC): Secondary | ICD-10-CM

## 2022-09-11 NOTE — Patient Instructions
It was a pleasure seeing you today in the Neurosurgery Clinic! Dr. Vonita Moss would like to schedule you for a cerebral angiogram with treatment.     This procedure will be done in the Interventional Radiology (IR) department, and you should expect to stay overnight at least one night for monitoring following your treatment. The IR team will be in touch with you to schedule your procedure soon.   When you are scheduled, you will be given specific information about pre-anesthesia testing, necessary lab work, and medication changes if there are any. You will also be informed about check-in time, diet, and day-of-exam instructions. If you have any questions or are running late day of your procedure, please call our IR department at [phone].     After your procedure or at discharge, the inpatient team should facilitate a follow-up appointment with Dr. Vonita Moss. Should you have any questions and/or concerns, please send me a message via MyChart or call me at the number below.    Jamison Oka., RN, BSN  Interim Clinical Nurse Coordinator for Dr. Consuela Mimes   [facility]  Phone: [phone] - Fax: [phone]

## 2022-09-14 NOTE — Patient Education
Dear Brendan Acosta,     Thank you for choosing [facility] Interventional Radiology for your procedure. Your appointment information is listed below:      We have you scheduled for a telehealth appointment with pre anesthesia testing on 09/15/22 at 3:00 PM.   You will receive a link via mychart with instructions to access this appointment     We have your surgery scheduled for 09/24/22 at 11:00 AM  Check into admissions radiology on the first floor at 9:30 AM [employer]   [employer] is located at [employer] and you can Nicole in the P5 parking garage next to the tower  Nothing to eat or drink after midnight.  You will be admitted overnight post procedure          INTERVENTIONAL RADIOLOGY  PRE-PROCEDURE INSTRUCTIONS ANESTHESIA     You are scheduled for a procedure in Interventional Radiology with procedural sedation.  Please follow these instructions and any direction from your Primary Care/Managing Physician.  If you have questions about your procedure or need to reschedule please call [phone].     Medication Instructions:   You may take your medications the morning of your procedure with a sip of water. Dr. Vonita Moss approved continuing Xarelto, you do not need to stop taking this medication prior to procedure.        Diet Instructions:  (8) hours before your procedure, stop your regular diet and start a clear liquid diet.  (6) hours before your procedure, discontinue tube feedings and chewing tobacco.  (2) hours before your procedure discontinue clear liquids.  You should have nothing by mouth. This includes GUM or CANDY.      Clear Liquid Diet     Water                 Apple or White Grape Juice        Coffee or tea without cream   Tea                    White Cranberry Juice                 Chicken Bouillon or Broth (no noodles)  Soda Pop           Popsicles                                     Beef Bouillon or Broth (no noodles)     Day of Exam Instructions:  Bathe or shower with an antibacterial soap prior to your appointment.  If you have a history of Obstructive Sleep Apnea (OSA) bring your CPAP/BIPAP.   Bring a list of your current medications and the dosages.  Wear comfortable clothing and leave valuables at home.  Arrive 90 minutes prior to your appointment.  This time will be spent registering, interviewing, assessing, educating and preparing you for the test.

## 2022-09-15 ENCOUNTER — Encounter: Admit: 2022-09-15 | Discharge: 2022-09-15 | Payer: MEDICARE

## 2022-09-15 DIAGNOSIS — S065XAA Traumatic subdural hemorrhage with loss of consciousness status unknown, initial encounter (HCC): Secondary | ICD-10-CM

## 2022-09-15 NOTE — Telephone Encounter
Patients daughter called into clinic this RN returned call name and DoB verified. Gavin Pound confirmed patient followed up with Dr. Vonita Moss and is scheduled for procedure with Dr. Vonita Moss. The question Gavin Pound has is does the patient need to keep his appointment with Dr. Hyacinth Meeker and the CT head imaging. This RN will pass along to primary RN to ask Dr. Hyacinth Meeker. Gavin Pound verbalized understanding and voiced no additional concerns.

## 2022-09-18 ENCOUNTER — Encounter: Admit: 2022-09-18 | Discharge: 2022-09-18 | Payer: MEDICARE

## 2022-09-18 ENCOUNTER — Ambulatory Visit: Admit: 2022-09-18 | Discharge: 2022-09-19 | Payer: MEDICARE

## 2022-09-18 DIAGNOSIS — I6203 Nontraumatic chronic subdural hemorrhage: Secondary | ICD-10-CM

## 2022-09-18 DIAGNOSIS — I77810 Thoracic aortic ectasia: Secondary | ICD-10-CM

## 2022-09-18 DIAGNOSIS — E079 Disorder of thyroid, unspecified: Secondary | ICD-10-CM

## 2022-09-18 DIAGNOSIS — I2510 Atherosclerotic heart disease of native coronary artery without angina pectoris: Secondary | ICD-10-CM

## 2022-09-18 DIAGNOSIS — I82409 Acute embolism and thrombosis of unspecified deep veins of unspecified lower extremity: Secondary | ICD-10-CM

## 2022-09-18 NOTE — Pre-Anesthesia Patient Instructions
PREPROCEDURE INFORMATION    Arrival at the hospital  [facility]  [address]  [place], [place] [address]    [place] in the P5 parking garage located at [address] [place], [place] [address].   If parking in the P5 garage, take the east elevators in the parking garage to the second level and walk to the entrance of the [employer].    Enter through the 1st floor main entrance and check in with Information Desk.     You will receive your day of surgery arrival time from your Surgeon's office.  If you have any questions, please contact your Surgeon's office for assistance.    Eating or drinking before surgery  If you have received specific instructions from your surgeon, please follow those.    Planning transportation for outpatient procedure  For your safety, you will need to arrange for a responsible ride/person to accompany you home due to sedation or anesthesia with your procedure.  An [employer], taxi or other public transportation driver is not considered a responsible person to accompany you home.    Bath/Shower Instructions  Take a bath or shower with antibacterial soap the night before and the morning of your procedure. Use clean towels.  Put on clean clothes after bath or shower.  Avoid using lotion and oils.  If you are having surgery above the waist, wear a shirt that fastens up the front.  Sleep on clean sheets if bath or shower is done the night before procedure.    Morning of your procedure:  Brush your teeth and tongue  Do not smoke, vape, chew or user any tobacco products.  Do not shave the area where you will have surgery.  Remove nail polish, makeup and all jewelry (including piercings) before coming to the hospital.  Dress in clean, loose, comfortable clothing.    Valuables  Leave money, credit cards, jewelry, and any other valuables at home. If medications are being filled and picked up at a [place] pharmacy, payment will be needed. [facility] is not responsible for the loss or breakage of personal items.    What to bring to the hospital  ID/Insurance card  Medical Device card  Official documents for legal guardianship  Copy of your Living Will, Advanced Directives, and/or Durable Power of Attorney. If you have these documents, please bring them to the admissions office on the day of your surgery to be scanned into your records.  Do not bring medications from home unless instructed by a pharmacist.  CPAP/BiPAP machine (including all supplies)  Walker, cane, or motorized scooter  Cases for glasses/hearing aids/contact lens (bring solutions for contacts)  Stimulator remote  Insulin pump and supplies as directed by pharmacy     Notify us at [employer]: [phone] on the day of your procedure if:  You need to cancel your procedure.  You are going to be late.    Notify your surgeon if:  You become ill with a cough, fever, sore throat, nausea, vomiting or flu-like symptoms.  You have any open wounds/sores that are red, painful, draining, or are new since you last saw the doctor.  You need to cancel your procedure.    Preparing to get your medications at discharge  Your surgeon may prescribe you medications to take after your procedure.  If you like the convenience of having your medications filled here at [place], please do the following:  Go to [facility] [address] pharmacy after your [facility]  appointment to put a credit card on file.  Call [facility] pharmacy at [phone] (Monday-Friday 7am-9pm or Saturday and Sunday 9am-5pm) to put a credit card on file.  Bring a credit card or cash on the day of your procedure- please leave with a family member rather than bringing it into the preop area.    Current Visitor Policy:  Visitors must be free of fever and symptoms to be in our facilities.  No more than 2 visitors per patient are allowed.  Additional guidelines may vary, based on patient care area or patient's condition.  Patients in semiprivate rooms may have visitors, but visits should be coordinated so only two total visitors are in a room at a time due to space limitations.  Children younger than age 13 are allowed to visit inpatients.    Thank you for participating in your Preoperative Assessment visit today.    If you have any changes to your health or hospitalizations between now and your surgery, please call us at [phone].    Instructions given to patient via: MyChart and verbal    Pre-Surgery Shower    Bathe with an antibacterial soap such as Dial Antibacterial (Dial Gold antibacterial bar soap - can be purchased at a pharmacy or [employer]).    Shower with this soap the DAY BEFORE and the MORNING OF your surgery.    This mild soap reduces the amount of germs on your skin that could cause an infection.    1) Shampoo your hair and wash your face with the soap you normally use.    2) From the neck down, apply the antibacterial bar soap with a clean washcloth.      3) Do not shave surgical area.    4) Turn water off to prevent rinsing the antibacterial soap off too soon.  Wash your body gently for 2 minutes.  Pay special attention to the area where you will have your surgery.  Do not wash with your regular soap after the antibacterial soap is used.    5) After this soap has been on your skin for 2 minutes, rinse your body thoroughly.    6) Pat yourself dry with a clean, soft towel.    7) Do not put products such as lotion, deodorant, powder, or perfume/aftershave on your hair, face or skin after your shower.    8) Put on freshly laundered clothes and sleep on freshly laundered linens.    9) Wear freshly laundered clothes to the hospital the day of surgery.

## 2022-09-18 NOTE — Unmapped
[facility] Anesthesia Pre-Procedure Evaluation    Name: Brendan Acosta      MRN: [id number]     DOB: January 02, 1932     Age: 89 y.o.     Sex: male   _________________________________________________________________________     Procedure Info:   Procedure Information       Date/Time: 09/24/22 1100    Procedure: IR CEREBRAL ARTERIOGRAM DIAGNOSTIC WITH INTERVENTION    Location: Interventional Radiology: [facility]            Physical Assessment  Vital Signs (last filed in past 24 hours):         Patient History   Allergies   Allergen Reactions    Niacin HIVES    Benzalkonium SEE COMMENTS     Allergic to preservative in Cosopt, Trusopt, and Timolol per Dr [facility]   Tolerates Cosopt PF only    Isosorbide Mononitrate HEADACHE    Ranolazine SEE COMMENTS              Current Medications    Medication Directions   acetaminophen (TYLENOL EXTRA STRENGTH) 500 mg tablet Take one tablet by mouth every 4 hours as needed for Pain.   acetaminophen-codeine (TYLENOL-CODEINE #3) 300-30 mg tablet Take one tablet by mouth every 8 hours as needed for Pain.   dextran 70-hypromellose (PF) (ARTIFICIAL TEARS (PF)) 0.1/0.3 % ophthalmic solution Apply two drops to three drops to both eyes daily as needed for Dry Eyes.   diclofenac sodium (VOLTAREN) 1 % topical gel Apply four g topically to affected area daily as needed.   finasteride (PROSCAR) 5 mg tablet Take one tablet by mouth daily.   gabapentin (NEURONTIN) 300 mg capsule Take one capsule by mouth daily.   ketoconazole (NIZORAL) 2 % topical shampoo APPLY TO SCALP AND EARS FOR 5 MINUTES IN SHOWER THEN RINSE 2 TO 3 TIMES A WEEK AS NEEDED FOR FLARES   levETIRAcetam (KEPPRA) 1,000 mg tablet Take one tablet by mouth twice daily.   levothyroxine (SYNTHROID) 100 mcg tablet Take one tablet by mouth daily 30 minutes before breakfast.   other medication one Dose daily as needed. Medication Name & Strength (List each active ingredient & strength): [employer]  Dose (how much): 1 scoop Route: by mouth  Frequency (how often): daily as needed   polyethylene glycol 3350 (MIRALAX) 17 g packet Take one packet by mouth daily as needed.   rivaroxaban (XARELTO) 10 mg tablet Take one tablet by mouth daily with breakfast.   rOPINIRole (REQUIP) 3 mg tablet Take one tablet by mouth twice daily.   tamsulosin (FLOMAX) 0.4 mg capsule Take one capsule by mouth at bedtime daily. Do not crush, chew or open capsules. Take 30 minutes following the same meal each day.  Indications: enlarged prostate with urination problem   timolol maleate (TIMOPTIC) 0.25 % ophthalmic solution Apply one drop to both eyes twice daily.   ZIOPTAN (PF) 0.0015 % ophthalmic drop Apply one drop to both eyes daily.       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 Symptoms of intermittent aphasia, word salad. Concerning acute symptoms 09/16/22 with ED visit to [facility]    On requip for RLS      Musculoskeletal         Neck pain (mild recent right sided neck pain, ROM intact)      Back pain (chronic central back pain)      Arthritis (hips):       Hx right shoulder dislocation 5-6 years ago with continued pain        Endocrine/Other           Hypothyroidism      No history of blood transfusion        Malignancy (hx melanoma to right facial cheek):      Constitution - negative       Physical Exam    Airway Findings      Mallampati: II      TM distance: >3 FB      Neck ROM: full      Mouth opening: good      Airway patency: adequate    Dental Findings:       Bridges          Neurological Findings:       Alert and oriented x 3    Constitutional findings:       No acute distress      Well-developed       Diagnostic Tests  Hematology:   Lab Results   Component Value Date    HGB 14.1 08/06/2022    HCT 42.3 08/06/2022    PLTCT 230 08/06/2022    WBC 6.8 08/06/2022    MCV 95.5 08/06/2022    MCH 32.0 08/06/2022    MCHC 33.5 08/06/2022    MPV 8.4 08/06/2022    RDW 13.6 08/06/2022         General Chemistry:   Lab Results   Component Value Date    NA 144 08/06/2022    K 3.8 08/06/2022    CL 110 08/06/2022    CO2 23 08/06/2022    GAP 11 08/06/2022    BUN 28  08/06/2022    CR 0.72 08/06/2022    GLU 134 08/06/2022    CA 9.5 08/06/2022    ALBUMIN 4.4 08/02/2022    LACTIC 1.4 08/01/2022    OBSCA 1.18 08/05/2022    MG 2.2 08/06/2022    TOTBILI 1.2 08/02/2022    PO4 2.0 08/06/2022      Coagulation:   Lab Results   Component Value Date    PT 13.0 08/03/2022    PTT 28.4 08/01/2022    INR 1.2 08/03/2022       08/07/19 Cardiac Cath: (CE)  Normal RA pressure (mean RA of ). RVEDP of . Normal PA pressures with mean PA of (28/13). Normal PCWP of .   Transpulmonary gradient of with PVR of 0.6 Wood units. Cardiac output of 4.7 L/min and cardiac index of 2.3 L/min/m2 as estimated by the Fick method.     Co-dominant coronary system. Angiographically normal LM trunk. Moderate (50%) stenosis of the proximal LAD which was not hemodynamically significant by iFR (iFR of 0.94) or FFR (0.92). Mild nonobstructive CAD of the LCX system. CTO of the small-sized co-dominant RCA with collateral filling of the right PDA.     No indication for PCI.     04/08/21 Echo (CE)    1. Normal left ventricular systolic function with a calculated ejection   fraction of 65%.     2. Normal right ventricular chamber dimension. Normal systolic function.     3. No significant valvular abnormalities.     4. Mildly dilated proximal ascending aorta measuring 4.2 cm.     03/25/22 Cardiology f/u at [facility]. Impression: History of coronary artery disease with known chronic total occlusion of the distal right coronary artery in the setting of generous collateral flow. Symptoms of dyspnea attributed to diastolic dysfunction rather than underlying coronary disease.   Plan: continue medications. No additional cardiac workup is warranted. Follow up 1 year    08/24/22 RLE Doppler: (CE)  Extensive occlusive thrombus throughout the right lower extremity.     7/9-7/16/24 Admission to [facility] for DVT (CE). IVC filter placed on 08/26/22    09/16/22 CTA Head (CE)  1. No evidence of large vessel occlusion or hemodynamically significant stenosis.   2. Acute on early chronic subdural hematoma along the left cerebral convexity, not significantly changed compared to the prior study performed 09/08/2022.   3. Bilateral cerebral white matter changes due to chronic small vessel disease. Note is made that MRI is more sensitive for acute infarction.   4. Nondepressed right occipital bone fracture, stable compared to the prior study.   5. Chronic infarct within the right cerebellum.   6. Degenerative change involving the cervical spine, resulting in stenosis described above.   7. Cerebral and cerebellar volume loss.     09/16/22 EKG: (CE)  Sinus rhythm   Left axis deviation   Rate 55    09/16/22 CBC, CMP (CE):  Hgb 13.5  Plt 272  K 4.1  Creat 0.97      PAC Plan    Interview: Telehealth Interview    ASA Score: 3    Anesthesia Options Discussed: MAC and General                          PAC RISK ASSESSMENTS:   *Duke Activity Status Index (DASI): 38.2  , Calculated METS: 7.44   (score < 25 correlates with increased risk for death, MI, and moderate to severe complications,  max score 57)  *Revised Cardiac Risk Index (RCRI): 1 points=0.9% risk for major adverse cardiac events (myocardial infarction, pulmonary edema, cardiac arrest) (CAD)  *STOP-BANG Score: 2   (total 5-8 high risk for OSA, 3-4 with HCO3 >28 also high risk. OSA associated with greater than twice the odds for respiratory failure, cardiac events, ICU admission, and difficult intubation)            Alerts

## 2022-09-23 ENCOUNTER — Encounter: Admit: 2022-09-23 | Discharge: 2022-09-23 | Payer: MEDICARE

## 2022-09-23 DIAGNOSIS — H906 Mixed conductive and sensorineural hearing loss, bilateral: Secondary | ICD-10-CM | POA: Diagnosis not present

## 2022-09-24 ENCOUNTER — Ambulatory Visit: Admit: 2022-09-24 | Discharge: 2022-09-24 | Payer: MEDICARE

## 2022-09-24 ENCOUNTER — Inpatient Hospital Stay: Admit: 2022-09-24 | Discharge: 2022-09-24 | Payer: MEDICARE

## 2022-09-24 ENCOUNTER — Encounter: Admit: 2022-09-24 | Discharge: 2022-09-24 | Payer: MEDICARE

## 2022-09-24 VITALS — BP 129/81 | HR 59

## 2022-09-24 VITALS — BP 129/78 | HR 63

## 2022-09-24 VITALS — Ht 70.0 in | Wt 168.4 lb

## 2022-09-24 VITALS — BP 116/67 | HR 59 | Temp 97.60000°F

## 2022-09-24 VITALS — BP 132/78 | HR 56

## 2022-09-24 VITALS — BP 128/69 | HR 56 | Temp 97.30000°F

## 2022-09-24 VITALS — BP 108/77 | HR 70

## 2022-09-24 VITALS — BP 109/74 | HR 79 | Temp 97.50000°F

## 2022-09-24 VITALS — BP 107/72 | HR 70 | Temp 97.70000°F

## 2022-09-24 VITALS — HR 79

## 2022-09-24 VITALS — BP 111/72 | HR 65

## 2022-09-24 DIAGNOSIS — S065XAA Traumatic subdural hemorrhage with loss of consciousness status unknown, initial encounter (HCC): Secondary | ICD-10-CM

## 2022-09-24 LAB — POC GLUCOSE: POC GLUCOSE: 79 mg/dL (ref 70–100)

## 2022-09-24 LAB — POC CREATININE, RAD: CREATININE, POC: 0.8 mg/dL (ref 0.4–1.24)

## 2022-09-24 MED ORDER — SODIUM CHLORIDE 0.9 % IJ SOLN
50 mL | Freq: Once | INTRAVENOUS | 0 refills | Status: CP
Start: 2022-09-24 — End: ?
  Administered 2022-09-24: 21:00:00 50 mL via INTRAVENOUS

## 2022-09-24 MED ORDER — PHENYLEPHRINE 40 MCG/ML IN NS IV DRIP (STD CONC)
INTRAVENOUS | 0 refills | Status: DC
Start: 2022-09-24 — End: 2022-09-24
  Administered 2022-09-24 (×2): .4 ug/kg/min via INTRAVENOUS

## 2022-09-24 MED ORDER — ACETAMINOPHEN 500 MG PO TAB
500 mg | ORAL | 0 refills | Status: DC | PRN
Start: 2022-09-24 — End: 2022-09-24

## 2022-09-24 MED ORDER — ARTIFICIAL TEARS (PF) SINGLE DOSE DROPS GROUP
OPHTHALMIC | 0 refills | Status: DC
Start: 2022-09-24 — End: 2022-09-24

## 2022-09-24 MED ORDER — RIVAROXABAN 10 MG PO TAB
10 mg | Freq: Every day | ORAL | 0 refills | Status: DC
Start: 2022-09-24 — End: 2022-09-25
  Administered 2022-09-25: 14:00:00 10 mg via ORAL

## 2022-09-24 MED ORDER — ONDANSETRON HCL (PF) 4 MG/2 ML IJ SOLN
INTRAVENOUS | 0 refills | Status: DC
Start: 2022-09-24 — End: 2022-09-24

## 2022-09-24 MED ORDER — MELATONIN 5 MG PO TAB
5 mg | Freq: Every evening | ORAL | 0 refills | Status: DC | PRN
Start: 2022-09-24 — End: 2022-09-25

## 2022-09-24 MED ORDER — DIPHENHYDRAMINE HCL 50 MG/ML IJ SOLN
25 mg | INTRAVENOUS | 0 refills | Status: DC | PRN
Start: 2022-09-24 — End: 2022-09-25

## 2022-09-24 MED ORDER — IOHEXOL 350 MG IODINE/ML IV SOLN
60 mL | Freq: Once | INTRAVENOUS | 0 refills | Status: CP
Start: 2022-09-24 — End: ?
  Administered 2022-09-24: 21:00:00 60 mL via INTRAVENOUS

## 2022-09-24 MED ORDER — PROPOFOL INJ 10 MG/ML IV VIAL
INTRAVENOUS | 0 refills | Status: DC
Start: 2022-09-24 — End: 2022-09-24

## 2022-09-24 MED ORDER — ACETAMINOPHEN 500 MG PO TAB
1000 mg | ORAL | 0 refills | Status: DC
Start: 2022-09-24 — End: 2022-09-25
  Administered 2022-09-25: 04:00:00 1000 mg via ORAL

## 2022-09-24 MED ORDER — VERAPAMIL 2.5 MG/ML IV SOLN
0 refills | Status: CP
Start: 2022-09-24 — End: ?

## 2022-09-24 MED ORDER — SUGAMMADEX 100 MG/ML IV SOLN
INTRAVENOUS | 0 refills | Status: DC
Start: 2022-09-24 — End: 2022-09-24

## 2022-09-24 MED ORDER — FINASTERIDE 5 MG PO TAB
5 mg | Freq: Every day | ORAL | 0 refills | Status: DC
Start: 2022-09-24 — End: 2022-09-25

## 2022-09-24 MED ORDER — CALCIUM CARBONATE 200 MG CALCIUM (500 MG) PO CHEW
500 mg | ORAL | 0 refills | Status: DC | PRN
Start: 2022-09-24 — End: 2022-09-25

## 2022-09-24 MED ORDER — LACTATED RINGERS IV SOLP
INTRAVENOUS | 0 refills | Status: DC
Start: 2022-09-24 — End: 2022-09-24

## 2022-09-24 MED ORDER — IOHEXOL 300 MG IODINE/ML IV SOLN
100 mL | Freq: Once | INTRA_ARTERIAL | 0 refills | Status: CP
Start: 2022-09-24 — End: ?
  Administered 2022-09-24: 19:00:00 100 mL via INTRA_ARTERIAL

## 2022-09-24 MED ORDER — DEXAMETHASONE SODIUM PHOSPHATE 4 MG/ML IJ SOLN
INTRAVENOUS | 0 refills | Status: DC
Start: 2022-09-24 — End: 2022-09-24

## 2022-09-24 MED ORDER — LIDOCAINE (PF) 200 MG/10 ML (2 %) IJ SYRG
INTRAVENOUS | 0 refills | Status: DC
Start: 2022-09-24 — End: 2022-09-24

## 2022-09-24 MED ORDER — DEXAMETHASONE SODIUM PHOS (PF) 10 MG/ML IJ SOLN
6 mg | Freq: Once | INTRAVENOUS | 0 refills | Status: CP
Start: 2022-09-24 — End: ?
  Administered 2022-09-24: 23:00:00 6 mg via INTRAVENOUS

## 2022-09-24 MED ORDER — IMS MIXTURE TEMPLATE
3 mg | Freq: Two times a day (BID) | ORAL | 0 refills | Status: DC
Start: 2022-09-24 — End: 2022-09-29
  Administered 2022-09-25 – 2022-09-29 (×19): 3 mg via ORAL

## 2022-09-24 MED ORDER — PHENYLEPHRINE HCL IN 0.9% NACL 1 MG/10 ML (100 MCG/ML) IV SYRG
INTRAVENOUS | 0 refills | Status: DC
Start: 2022-09-24 — End: 2022-09-24

## 2022-09-24 MED ORDER — TAMSULOSIN 0.4 MG PO CAP
.4 mg | Freq: Every evening | ORAL | 0 refills | Status: DC
Start: 2022-09-24 — End: 2022-09-25
  Administered 2022-09-25: 04:00:00 0.4 mg via ORAL

## 2022-09-24 MED ORDER — SENNOSIDES-DOCUSATE SODIUM 8.6-50 MG PO TAB
1 | Freq: Every day | ORAL | 0 refills | Status: DC | PRN
Start: 2022-09-24 — End: 2022-09-29
  Administered 2022-09-27 – 2022-09-28 (×2): 1 via ORAL

## 2022-09-24 MED ORDER — NITROGLYCERIN(#) INJ 100MCG/ML IJ SOLN
0 refills | Status: CP
Start: 2022-09-24 — End: ?

## 2022-09-24 MED ORDER — TIMOLOL MALEATE 0.25 % OP DROP
1 [drp] | Freq: Two times a day (BID) | OPHTHALMIC | 0 refills | Status: DC
Start: 2022-09-24 — End: 2022-09-29
  Administered 2022-09-25 (×2): 1 [drp] via OPHTHALMIC

## 2022-09-24 MED ORDER — DIPHENHYDRAMINE HCL 25 MG PO CAP
25 mg | ORAL | 0 refills | Status: DC | PRN
Start: 2022-09-24 — End: 2022-09-25

## 2022-09-24 MED ORDER — LEVETIRACETAM 500 MG PO TAB
1000 mg | Freq: Two times a day (BID) | ORAL | 0 refills | Status: DC
Start: 2022-09-24 — End: 2022-09-25
  Administered 2022-09-25: 04:00:00 1000 mg via ORAL

## 2022-09-24 MED ORDER — ONDANSETRON HCL (PF) 4 MG/2 ML IJ SOLN
4 mg | INTRAVENOUS | 0 refills | Status: DC | PRN
Start: 2022-09-24 — End: 2022-09-29

## 2022-09-24 MED ORDER — GABAPENTIN 300 MG PO CAP
300 mg | Freq: Every evening | ORAL | 0 refills | Status: DC
Start: 2022-09-24 — End: 2022-09-25
  Administered 2022-09-25: 04:00:00 300 mg via ORAL

## 2022-09-24 MED ORDER — ONDANSETRON 4 MG PO TBDI
4 mg | ORAL | 0 refills | Status: DC | PRN
Start: 2022-09-24 — End: 2022-09-29

## 2022-09-24 MED ORDER — LEVOTHYROXINE 100 MCG PO TAB
100 ug | Freq: Every day | ORAL | 0 refills | Status: DC
Start: 2022-09-24 — End: 2022-09-25
  Administered 2022-09-25: 11:00:00 100 ug via ORAL

## 2022-09-24 MED ORDER — LIDOCAINE 4 % TP CREA
Freq: Once | TOPICAL | 0 refills | Status: CP
Start: 2022-09-24 — End: ?
  Administered 2022-09-24: 15:00:00 via TOPICAL

## 2022-09-24 MED ORDER — EPHEDRINE SULFATE 50 MG/ML IV SOLN
INTRAVENOUS | 0 refills | Status: DC
Start: 2022-09-24 — End: 2022-09-24

## 2022-09-24 MED ORDER — FENTANYL CITRATE (PF) 50 MCG/ML IJ SOLN
INTRAVENOUS | 0 refills | Status: DC
Start: 2022-09-24 — End: 2022-09-24

## 2022-09-24 MED ORDER — ROCURONIUM 10 MG/ML IV SOLN
INTRAVENOUS | 0 refills | Status: DC
Start: 2022-09-24 — End: 2022-09-24

## 2022-09-24 MED ORDER — POLYETHYLENE GLYCOL 3350 17 GRAM PO PWPK
1 | Freq: Every day | ORAL | 0 refills | Status: DC | PRN
Start: 2022-09-24 — End: 2022-09-25

## 2022-09-24 NOTE — Acute Stroke Response
Name: Brendan Acosta   MRN: [id number]     DOB: [birth year]      Age: 89 y.o.  Admission Date: 09/24/2022     LOS: 0 days     Date of Service: 09/24/2022       Allergies:  Niacin, Benzalkonium, Isosorbide mononitrate, and Ranolazine    Type of Acute Stroke Response Team note: Consult    Stroke Activation Tier: Tier 1       Assessment & Plan    Chief Complaint: garbled speech, R facial droop, dysarthria  Assessment: Brendan Acosta is a 89 y.o. male with h/o chronic subdural hematoma, DVT on AC, s/p IVC filter, CAD, hypothyroidism, RLS, BPH, HLD admitted for L MMA embolization for chronic subdural hematoma. Had a fall in June which resulted in a L sided frontal SDH, L SAH, R IVH. Was admitted for this and hemorrhages were found to be stable. Was started on Keppra at this time, Xarelto was continued due to his R leg DVT. Reportedly had been having episodes of garbled speech at home which would last up to 30 minutes. Underwent MMA embolization on 8/8 and reportedly was speaking normally following procedure. LKW was 1525 on 8/8, at which time he began speaking abnormally with garbled speech and was dysarthric. Also R facial droop was noted. NIH was 6. CT head showed chronic L subdural hematoma with no definite areas of stroke or acute hemorrhage. CTA head an neck showed no LVO though did show small caliber L M2 and M3 branches with distal tapering compared to R. TNK was not given due to recent [facility] and intracranial bleed/head trauma, not a thrombectomy candidate due to no LVO. Extended exam revealed some RUE apraxia although exam was otherwise nonfocal.     Impression: Patient presenting with aphasia and dysarthria along with R facial droop and RUE apraxia. Symptoms match L MCA distribution although no acute stroke or LVO seen on CT/CTA. Cerebral vasospasm may be a consideration given paucity of contrast seen in L MCA distribution on CTA. Focal seizure is also a consideration.    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 Consuella Lose    History of Present Illness  Brendan Acosta is a 89 y.o. male with h/o chronic subdural hematoma, DVT on AC, s/p IVC filter, CAD, hypothyroidism, RLS, BPH, HLD admitted for L MMA embolization for chronic subdural hematoma. Had a fall in June which resulted in a L sided frontal SDH, L SAH, R IVH. Was admitted for this and hemorrhages were found to be stable. Was started on Keppra at this time, Xarelto was continued due to his R leg DVT. Reportedly had been having episodes of garbled speech at home which would last up to 30 minutes. Underwent MMA embolization on 8/8 and reportedly was speaking normally following procedure. LKW was 1525 on 8/8, at which time he began speaking abnormally with garbled speech and was dysarthric. Also R facial droop was noted.    On evaluation  at bedside, patient continues to be aphasic with garbled speech. He is able to understand some speech, states his name but says 21 along with other numbers when asked his age or where he is.     Review of Systems    Review of systems not obtained from patient due to patient factors.      Stroke Activation Summary       ASRT Arrival: 1545  Location of Response : [facility] IR 04   Page Received: 1541    Clinical Presentation: Aphasia, Dysarthria, Right facial droop    Signs & Symptoms: Last Known Well  Last Known Well - Date: 09/24/22  Last Known Well - Time: 1520     CT/CTP/CTA:        BP: 129/81 (08/08 1600)  Temp: 36.5 ?C (97.7 ?F) (08/08 1433)  Pulse: 59 (08/08 1600)  Respirations: 14 PER MINUTE (08/08 1600)  SpO2: 98 % (08/08 1600)  O2 Device: None (Room air) (08/08 1615)    NIHSS Completed at: 1600      NIH Stroke Scale Item  Scoring Definition  Score    1a. LOC  0=alert and responsive   1=arousable to minor stimulation   2=arousable only to painful stimulation   3=reflex responses or unrousable  0    1b. LOC questions-as patient?s age and month. Must be exact.  0=both correct   1=one correct (or dysarthria, intubated, foreign language)   2=neither correct  2    1c. Commands-open/close eyes, grip and release non-paretic hand (other 1 step commands or mimic OK)  0=both correct (ok if impaired by weakness)   1=one correct   2=neither correct  1    2. Best Gaze-horizontal EOM by voluntary or Doll?s  0=normal   1=partial gaze palsy (abnormal gaze in one or both eyes)   2=forced eye deviation or total paresis which cannot be overcome by Doll?s  0    3. Visual Field-use visual threat if necessary. If monocular, score field of good eye  0=no visual loss   1=partial hemianopia, quadrantanopia, extinction   2=complete hemianopia   3=bilateral hemianopia or blindness  0    4. Facial Palsy-if stuporous, check symmetry of grimace to pain  0=normal   1=minor paralysis, flat NLF, asymm smile   2=partial paralysis (lower face=UMN)   3=complete paralysis (upper and lower face)  1    5. Motor Arm-arms outstretched 90 deg (sitting) or 45 deg (supine) for 10 seconds. Encourage best effort.  0=no drift x 10 seconds   1=drift but doesn?t hit bed   2=some antigravity effort, but can?t sustain   3=no antigravity effort, but even minimal mvt counts   4=no movement at all   X=unable to assess due to amputation, fusion, etc  L/R   0/0    6. Motor Leg-raise leg to 30 degrees supine x 5 seconds  0=no drift x 5 seconds   1=drift but doesn?t hit bed   2=some antigravity effort, but can?t sustain   3=no antigravity effort, but even minimal mvt counts   4=no movement at all   X=unable to assess due to amputation, fusion, etc  L/R   0/0    7. Limb Ataxia-check finger-nose- finger; heel-shin; and score only if out of proportion to paralysis  0=no ataxia (or aphasic, hemiplegic)   1=ataxia in upper or lower extremity   2=ataxia in upper AND lower extremity   X=unable to assess due to amputation, fusion, etc  1   8. Sensory-use  safety pin. Check grimace or withdrawal if stuporous. Score only stroke- related losses  0=normal   1=mild-mod unilateral loss but patient aware of touch 9or aphasic, confused)   2=total loss, pt unaware of touch. Coma, bilateral loss  0   9. Best Language-describe cookie jar picture, name objects, read sentences. May use repeating, writing, stereognosis  0=normal   1=mild-mod aphasia (diff but partly comprehensible)   2=severe aphasia (almost no info exchanged)   3=mute, global aphasia, coma. No 1 step commands  2    10. Dysarthria-read list of words  0=normal or intubated or other physical barrier  1=mild-mod; slurred but intelligible   2=severe; unintelligible or mute  1    11. Extinction/Neglect- simultaneously touch patient on both hands, show fingers in both visual fields, ask about deficit, left hand  0=normal, none detected. (visual loss alone)   1=neglects or extinguishes to double stimulation in any modality   2=profound neglect in more than one modality  0    Score    8     Was the patient 4.5-9 hrs from last known well or a wake-up stroke? No    Was IV tenecteplase given? No    The patient was not a thrombolytic candidate due to Recent significant head trauma (<3 mo), CT findings or ICH or SAH, and Use of thrombin inhibitors or direct Xa inhibitors in past 48 hours    Advanced imaging was interpreted at 1650  The patient was not a thrombectomy candidate due to no LVO     Dysphagia screen:  Did Patient Pass The Swallow Screen Part I?: No  If No Name of Physician Notified: Dr. Smith Robert                Performed by nursing staff and passed or Failed screen by nursing staff and awaiting ST evaluation     Cardiac rhythm on presentation: NSR         Health History    Past Medical History:   Diagnosis Date    CAD (coronary artery disease)     Chronic subdural hematoma (HCC)     left    Deep vein thrombosis (DVT) (HCC) 08/24/2022    RLE    Dilation of thoracic aorta (HCC)     mild    Thyroid disease      Surgical History:   Procedure Laterality Date    VENA CAVA FILTER PLACEMENT  08/2022    [facility]    BRONCHOSCOPY      with FNA    CATARACT REMOVAL WITH IMPLANT      HX HERNIA REPAIR      HX TONSIL AND ADENOIDECTOMY      UVULOPALATOPHARYGOPLASTY       Family History   Problem Relation Name Age of Onset    Stroke Mother      Heart Attack Father       Social History     Socioeconomic History    Marital status: Married   Tobacco Use    Smoking status: Never     Passive exposure: Never    Smokeless tobacco: Never   Vaping Use    Vaping status: Never Used   Substance and Sexual Activity    Alcohol use: Not Currently     Comment: Will have a drink on the holidays    Drug use: Never              Medications:  finasteride (PROSCAR) tablet 5 mg, 5 mg,  Oral, QDAY  gabapentin (NEURONTIN) capsule 300 mg, 300 mg, Oral, QHS  levETIRAcetam (KEPPRA) tablet 1,000 mg, 1,000 mg, Oral, BID  levothyroxine (SYNTHROID) tablet 100 mcg, 100 mcg, Oral, QDAY 30 min before breakfast  rivaroxaban (XARELTO) tablet 10 mg, 10 mg, Oral, QDAY w/breakfast  rOPINIRole (REQUIP) tablet 3 mg, 3 mg, Oral, BID  tamsulosin (FLOMAX) capsule 0.4 mg, 0.4 mg, Oral, QHS  timolol maleate (TIMOPTIC) 0.25 % ophthalmic solution 1 drop, 1 drop, Both Eyes, BID        PRN Medications:  acetaminophen Q4H PRN, diphenhydrAMINE HCL Q4H PRN **OR** diphenhydrAMINE HCL Q4H PRN, ondansetron Q6H PRN **OR** ondansetron (ZOFRAN) IV Q6H PRN, polyethylene glycol 3350 QDAY PRN, sennosides-docusate sodium QDAY PRN       Physical Exam    HEENT: normocephalic, eyes open with no discharge, nares patent, oropharynx is clear with no lesions, palate intact  Chest: normal configuration, equal chest rise bilaterally    Extended Neuro Exam:    Mental status: alert, oriented to person, unintelligible answers for place or time  Speech:    Normal Abnormal   Fluency  Aphasic, garbled speech   Comprehension  Able to follow some commands   Articulation  Slurring speech   Repetition x    Naming x        Cranial Nerves:    Normal Abnormal   II x    III, IV, VI x    V x    VII                           R NL fold flattening, facial movements full otherwise                     VIII x    IX, X x    XI x    XII x        Muscle/motor:   Tone: nml  Bulk: nml  Fasciculations: none  Pronator drift: none     NF NE SA EF EE WE WF FF FE FA TA HF HA HE KF KE DF PF   R   5 5 5 4 4     5 5 5 5 5 5 5    L   5 5 5 4 4     5 5 5 5 5 5 5        Sensation:    Normal RUE LUE RLE LLE   Light Touch x           Coordination:    Normal Abnormal Right Normal Left   Finger to Nose  Unable to perform on R x   Heel to Shin x       Gait and Station:  Not assessed    Reflexes:    Right Left   Triceps 1 1   Biceps 1 1   Brachioradialis 1 1   Patella 1 1   Ankle 1 1   Plantar down down          Lab/Radiology/Other Diagnostic Tests:  24-hour labs:    Results for orders placed or performed during the hospital encounter of 09/24/22 (from the past 24 hour(s))   POC GLUCOSE    Collection Time: 09/24/22 11:03 AM   Result Value Ref Range    Glucose, POC 79 70 - 100 MG/DL   POC CREATININE, RAD    Collection Time: 09/24/22 11:07 AM   Result Value Ref Range  Creatinine, POC 0.8 0.4 - 1.24 MG/DL     POC Glucose (Download): 79 (09/24/22 1103)  Pertinent radiology reviewed.

## 2022-09-24 NOTE — Progress Notes
Pt arrived to IR pre/post for procedure work up.     Mobility status: Wheelchair- Transfers without Assist   Procedure reviewed and pt verbalized understanding.   Pt cart locked in low position, call light within reach.   See doc flowsheets for further details.    Will continue to monitor patient.

## 2022-09-24 NOTE — Unmapped
Immediate Post Procedure Note  09/24/22      Attending Physician: Consuela Mimes, MD  Assistant(s): Harrell Gave, MD  Procedure(s): diagnostic cerebral angiogram - left middle meningeal artery embolization  Indications: Left chronic SDH - need for continued anticoagulation for DVTs  Findings: Successful embolization of the left middle meningeal artery  Anesthesia: General  Sedation/Medication Plan: per anesthesia  Time out performed: Consent obtained, correct patient verified, correct procedure verified, correct site verified, patient marked as necessary.     Implants:   Implant Name Serial No. Manufacturer Lot No. LRB No. Used Action   SPHERE EMBOLIZATION YELLOW EMBOSPHERE 100-300UM MICROSPHERE - SNA NA [employer] [id number] Left 1 Implanted   COIL EMBOLIZATION 4CM TARGET DETACHABLE ULTRA 360D HELICAL - [employer] NA [employer] [id number] Left 1 Implanted   COIL EMBOLIZATION 4CM 1.5MM TARGET DETACHABLE ULTRA 360D - SNA NA [employer] [id number] Left 1 Implanted     Estimated Blood Loss:  Minimal  Specimen(s) Removed/Disposition:  None  Complications: None  Comments:  Admit to floor overnight if doing well D/c tomorrow  Closure Device:  TR band      Recommended Blood Pressure Parameters:  SBP - Normotensive  Recommended Anticoagulants:  Can continue AC regimen on discharge      Patient sedated, unable to perform neurological exam.    Consuela Mimes, MD  [phone]    Coding:  [phone] - [place] access rt radial  69629 - Left ICA  36227 - Left ECA  52841 - left MMA embolization  75894 - embo  32440 - post embo angio

## 2022-09-24 NOTE — Progress Notes
Patient care assumed by Anesthesia team at this time. This RN available as needed.

## 2022-09-24 NOTE — Progress Notes
Stroke Team activated at 1541. Please see docflowsheets for further information.

## 2022-09-24 NOTE — Progress Notes
1520: Anesthesia sign out completed by Laurel Dimmer, MD.

## 2022-09-24 NOTE — H&P (View-Only)
IR Pre-Procedure History and Physical/Sedation Plan    Procedure Date: 09/24/2022     Planned Procedure(s):  Cerebral arteriogram with intervention    Procedural code status: FULL    Indication:  MMA embolization  __________________________________________________________________    Chief Complaint:  SDH    History of Present Illness: Brendan Acosta is a 89 y.o. male with a history as listed below who presents today for procedure.    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    Ascending aorta dilation (HCC) 12/28/2017    Hypothyroidism 10/22/2010     Past Medical History:   Diagnosis Date    CAD (coronary artery disease)     Chronic subdural hematoma (HCC)     left    Deep vein thrombosis (DVT) (HCC) 08/24/2022    RLE    Dilation of thoracic aorta (HCC)     mild    Thyroid disease       Surgical History:   Procedure Laterality Date    VENA CAVA FILTER PLACEMENT  08/2022    [facility]    BRONCHOSCOPY      with FNA    CATARACT REMOVAL WITH IMPLANT      HX HERNIA REPAIR      HX TONSIL AND ADENOIDECTOMY      UVULOPALATOPHARYGOPLASTY        Social History     Tobacco Use    Smoking status: Never     Passive exposure: Never    Smokeless tobacco: Never   Substance Use Topics    Alcohol use: Not Currently     Comment: Will have a drink on the holidays      Family History   Problem Relation Name Age of Onset    Stroke Mother      Heart Attack Father        Medications Prior to Admission   Medication Sig Dispense Refill Last Dose    acetaminophen (TYLENOL EXTRA STRENGTH) 500 mg tablet Take one tablet by mouth every 4 hours as needed for Pain. acetaminophen-codeine (TYLENOL-CODEINE #3) 300-30 mg tablet Take one tablet by mouth every 8 hours as needed for Pain.       dextran 70-hypromellose (PF) (ARTIFICIAL TEARS (PF)) 0.1/0.3 % ophthalmic solution Apply two drops to three drops to both eyes daily as needed for Dry Eyes.       diclofenac sodium (VOLTAREN) 1 % topical gel Apply four g topically to affected area four times daily as needed.       finasteride (PROSCAR) 5 mg tablet Take one tablet by mouth daily.       fluticasone propionate (FLONASE) 50 mcg/actuation nasal spray, suspension Apply two sprays to each nostril as directed daily as needed.       gabapentin (NEURONTIN) 300 mg capsule Take one capsule by mouth at bedtime daily.       ketoconazole (NIZORAL) 2 % topical shampoo APPLY TO SCALP AND EARS FOR 5 MINUTES IN SHOWER THEN RINSE 2 TO 3 TIMES A WEEK AS NEEDED FOR FLARES       levETIRAcetam (KEPPRA) 1,000 mg  tablet Take one tablet by mouth twice daily. 60 tablet 0     levothyroxine (SYNTHROID) 100 mcg tablet Take one tablet by mouth daily 30 minutes before breakfast.       other medication one Dose daily as needed. Medication Name & Strength (List each active ingredient & strength): [employer]  Dose (how much): 1 scoop  Route: by mouth  Frequency (how often): daily as needed       polyethylene glycol 3350 (MIRALAX) 17 g packet Take one packet by mouth daily.       rivaroxaban (XARELTO) 10 mg tablet Take one tablet by mouth daily with breakfast.       rOPINIRole (REQUIP) 3 mg tablet Take one tablet by mouth twice daily.       tamsulosin (FLOMAX) 0.4 mg capsule Take one capsule by mouth at bedtime daily. Do not crush, chew or open capsules. Take 30 minutes following the same meal each day.  Indications: enlarged prostate with urination problem 90 capsule 1     timolol maleate (TIMOPTIC) 0.25 % ophthalmic solution Apply one drop to both eyes twice daily.       turmeric (bulk) 100 % powd Take  by mouth daily.       ZIOPTAN (PF) 0.0015 % ophthalmic drop Apply one drop to both eyes daily.        Allergies   Allergen Reactions    Niacin HIVES    Benzalkonium SEE COMMENTS     Allergic to preservative in Cosopt, Trusopt, and Timolol per Dr [facility]   Tolerates Cosopt PF only    Isosorbide Mononitrate HEADACHE    Ranolazine SEE COMMENTS             Review of Systems  A comprehensive review of systems was negative.    Previous Personal Anesthetic/Sedation History:  Denies adverse events related to sedation/anesthesia.     Previous Family Anesthetic/Sedation History: Denies adverse events related to sedation/anesthesia.    Physical Exam:  Vital Signs: Last Filed In 24 Hours Vital Signs: 24 Hour Range   BP: 116/67 (08/08 0950)  Temp: 36.4 ?C (97.6 ?F) (08/08 2956)  Pulse: 59 (08/08 0950)  Respirations: 18 PER MINUTE (08/08 0950)  O2 Device: None (Room air) (08/08 0950) BP: (116)/(67)   Temp:  [36.4 ?C (97.6 ?F)]   Pulse:  [59]   Respirations:  [18 PER MINUTE]   O2 Device: None (Room air)          General appearance: Alert and no distress noted.  Neurologic: Grossly normal.  Lungs: Non labored at rest.  Heart: Regular rate and rhythm  Abdomen: Non-distended    Pre-procedure anxiolysis plan: Per Anesthesia  Sedation/Medication Plan: Per Anesthesia  Personal history of sedation complications: Per Anesthesia  Family history of sedation complications: Per Anesthesia  Medications for Reversal: Per Anesthesia  Discussion/Reviews:  Physician has discussed risks and alternatives of this type of sedation and above planned procedures with patient    NPO Status: Acceptable  Airway:  Per Anesthesia  Head and Neck: Per Anesthesia  Mouth: Per Anesthesia   Anesthesia Classification:  Per Anesthesia  Pregnancy Status: N/A     Lab/Radiology/Other Diagnostic Tests:  Labs:  Hematology:    Lab Results   Component Value Date    HGB 14.1 08/06/2022    HCT 42.3 08/06/2022    PLTCT 230 08/06/2022    WBC 6.8 08/06/2022    MCV 95.5 08/06/2022    MCH 32.0 08/06/2022 MCHC 33.5 08/06/2022  MPV 8.4 08/06/2022    RDW 13.6 08/06/2022   , Coagulation:    Lab Results   Component Value Date    PT 13.0 08/03/2022    PTT 28.4 08/01/2022    INR 1.2 08/03/2022   , and General Chemistry:    Lab Results   Component Value Date    NA 144 08/06/2022    K 3.8 08/06/2022    CL 110 08/06/2022    CO2 23 08/06/2022    GAP 11 08/06/2022    BUN 28 08/06/2022    CR 0.72 08/06/2022    GLU 134 08/06/2022    CA 9.5 08/06/2022    ALBUMIN 4.4 08/02/2022    LACTIC 1.4 08/01/2022    OBSCA 1.18 08/05/2022    MG 2.2 08/06/2022    TOTBILI 1.2 08/02/2022    PO4 2.0 08/06/2022              Wesley Blas, APRN-NP  Pager [phone]

## 2022-09-24 NOTE — Progress Notes
1430: Pt return from MMA embolization to pre/post. Pt alert and oriented, following commands, speech is clear. TR band in place on R wrist.   1530: Pt noted to have delayed speech with inappropriate words and incomprehensible speech. Pt able to follow basic commands, but unable to complete complex tasks including finger-to-nose and sliding heel along shin. Candise Bowens, CNC at bedside. Vonita Moss, MD notified.   1541: Pt stroke activated. Pt transported to CT with this RN and Quitman Livings, Charity fundraiser.

## 2022-09-24 NOTE — Progress Notes
Patient arrived to unit via cart, patient transferred to bed. 4 eye skin check, focused assessment and vitals completed. Orders released, reviewed and implemented accordingly.   Call light within reach. Plan of care ongoing.

## 2022-09-24 NOTE — Acute Stroke Response
RN Stroke Activation Summary    Date of Service:  09/24/2022  Brendan Acosta is a 89 y.o.  male.     DOB: 04/19/1931                  MRN#:  [id number]  Allergies:  Niacin, Benzalkonium, Isosorbide mononitrate, and Ranolazine       ASRT Arrival: 1545  Location of Response : [facility] IR 04    Page Received: 1541          Clinical Presentation: Aphasia, Dysarthria, Right facial droop    Total Stroke Scale Score: 6    Signs & Symptoms: Last Known Well  Last Known Well - Date: 09/24/22  Last Known Well - Time: 1520     Dysphagia screen:  Patient Intubated?: No  Did Patient Pass The Swallow Screen Part I?: No  If No Name of Physician Notified: Dr. Smith Robert                Plan:    Summary:  Patient not a candidate for acute stroke intervention at this time.  Please see Acute Stroke Response Provider Note for further information.    Radiology/Interventional Delay  Decision to IR: No         N/A  IV Thrombolytic in the Scanner: No    Plan: Patient to remain in current level of care.     Call Completion: 1615    RN handoff: Irving Burton, RN        Jennet Maduro, RN

## 2022-09-25 ENCOUNTER — Encounter: Admit: 2022-09-25 | Discharge: 2022-09-25 | Payer: MEDICARE

## 2022-09-25 ENCOUNTER — Emergency Department (HOSPITAL_COMMUNITY)
Admission: EM | Admit: 2022-09-25 | Discharge: 2022-09-26 | Disposition: A | Discharge status: Home / Self Care | Payer: Medicare Other | Attending: Emergency Medicine | Admitting: Emergency Medicine

## 2022-09-25 ENCOUNTER — Emergency Department (HOSPITAL_COMMUNITY): Payer: Medicare Other

## 2022-09-25 ENCOUNTER — Other Ambulatory Visit: Payer: Self-pay

## 2022-09-25 VITALS — BP 103/63 | HR 61 | Temp 97.50000°F

## 2022-09-25 VITALS — BP 91/50 | HR 67 | Temp 97.40000°F

## 2022-09-25 VITALS — BP 103/68 | HR 53 | Temp 97.50000°F

## 2022-09-25 VITALS — BP 100/60

## 2022-09-25 VITALS — BP 93/59 | HR 56 | Temp 97.60000°F

## 2022-09-25 VITALS — BP 104/60 | HR 57 | Temp 97.30000°F

## 2022-09-25 DIAGNOSIS — Z743 Need for continuous supervision: Secondary | ICD-10-CM | POA: Diagnosis not present

## 2022-09-25 DIAGNOSIS — R410 Disorientation, unspecified: Secondary | ICD-10-CM | POA: Diagnosis not present

## 2022-09-25 DIAGNOSIS — I6782 Cerebral ischemia: Secondary | ICD-10-CM | POA: Diagnosis not present

## 2022-09-25 DIAGNOSIS — F0390 Unspecified dementia without behavioral disturbance: Secondary | ICD-10-CM | POA: Diagnosis not present

## 2022-09-25 DIAGNOSIS — M25511 Pain in right shoulder: Secondary | ICD-10-CM | POA: Diagnosis not present

## 2022-09-25 DIAGNOSIS — I700 Atherosclerosis of aorta: Secondary | ICD-10-CM | POA: Diagnosis not present

## 2022-09-25 DIAGNOSIS — Z7982 Long term (current) use of aspirin: Secondary | ICD-10-CM | POA: Insufficient documentation

## 2022-09-25 DIAGNOSIS — R4182 Altered mental status, unspecified: Secondary | ICD-10-CM | POA: Insufficient documentation

## 2022-09-25 DIAGNOSIS — I509 Heart failure, unspecified: Secondary | ICD-10-CM | POA: Diagnosis not present

## 2022-09-25 DIAGNOSIS — R079 Chest pain, unspecified: Secondary | ICD-10-CM | POA: Diagnosis not present

## 2022-09-25 DIAGNOSIS — I10 Essential (primary) hypertension: Secondary | ICD-10-CM | POA: Diagnosis not present

## 2022-09-25 DIAGNOSIS — R404 Transient alteration of awareness: Secondary | ICD-10-CM | POA: Diagnosis not present

## 2022-09-25 DIAGNOSIS — M19011 Primary osteoarthritis, right shoulder: Secondary | ICD-10-CM | POA: Diagnosis not present

## 2022-09-25 DIAGNOSIS — R531 Weakness: Secondary | ICD-10-CM | POA: Insufficient documentation

## 2022-09-25 LAB — COMPREHENSIVE METABOLIC PANEL
ALT: 12 U/L (ref 0–44)
AST: 14 U/L — ABNORMAL LOW (ref 15–41)
Albumin: 2.8 g/dL — ABNORMAL LOW (ref 3.5–5.0)
Alkaline Phosphatase: 49 U/L (ref 38–126)
Anion gap: 11 (ref 5–15)
BUN: 12 mg/dL (ref 8–23)
CO2: 24 mmol/L (ref 22–32)
Calcium: 8.4 mg/dL — ABNORMAL LOW (ref 8.9–10.3)
Chloride: 100 mmol/L (ref 98–111)
Creatinine, Ser: 0.64 mg/dL (ref 0.61–1.24)
GFR, Estimated: >60 mL/min (ref >60)
Glucose, Bld: 97 mg/dL (ref 70–99)
Potassium: 3.9 mmol/L (ref 3.5–5.1)
Sodium: 135 mmol/L (ref 135–145)
Total Bilirubin: 0.4 mg/dL (ref 0.3–1.2)
Total Protein: 5.6 g/dL — ABNORMAL LOW (ref 6.5–8.1)

## 2022-09-25 LAB — CBC WITH DIFFERENTIAL/PLATELET
Abs Immature Granulocytes: 0.02 10*3/uL (ref 0.00–0.07)
Basophils Absolute: 0.1 10*3/uL (ref 0.0–0.1)
Basophils Relative: 1 %
Eosinophils Absolute: 0.4 10*3/uL (ref 0.0–0.5)
Eosinophils Relative: 5 %
HCT: 27.1 % — ABNORMAL LOW (ref 39.0–52.0)
Hemoglobin: 8.4 g/dL — ABNORMAL LOW (ref 13.0–17.0)
Immature Granulocytes: 0 %
Lymphocytes Relative: 31 %
Lymphs Abs: 2.5 10*3/uL (ref 0.7–4.0)
MCH: 27 pg (ref 26.0–34.0)
MCHC: 31 g/dL (ref 30.0–36.0)
MCV: 87.1 fL (ref 80.0–100.0)
Monocytes Absolute: 1 10*3/uL (ref 0.1–1.0)
Monocytes Relative: 13 %
Neutro Abs: 4 10*3/uL (ref 1.7–7.7)
Neutrophils Relative %: 50 %
Platelets: 458 10*3/uL — ABNORMAL HIGH (ref 150–400)
RBC: 3.11 MIL/uL — ABNORMAL LOW (ref 4.22–5.81)
RDW: 16.3 % — ABNORMAL HIGH (ref 11.5–15.5)
WBC: 7.9 10*3/uL (ref 4.0–10.5)
nRBC: 0 % (ref 0.0–0.2)

## 2022-09-25 LAB — URINALYSIS, W/ REFLEX TO CULTURE (INFECTION SUSPECTED)
Bacteria, UA: NONE SEEN
Bilirubin Urine: NEGATIVE
Glucose, UA: NEGATIVE mg/dL
Hgb urine dipstick: NEGATIVE
Ketones, ur: NEGATIVE mg/dL
Leukocytes,Ua: NEGATIVE
Nitrite: NEGATIVE
Protein, ur: NEGATIVE mg/dL
Specific Gravity, Urine: 1.01 (ref 1.005–1.030)
pH: 7 (ref 5.0–8.0)

## 2022-09-25 LAB — POC OCCULT BLOOD, ED: Fecal Occult Bld: NEGATIVE

## 2022-09-25 LAB — BASIC METABOLIC PANEL
BLD UREA NITROGEN: 21 mg/dL (ref 7–25)
CALCIUM: 10 mg/dL (ref 8.5–10.6)
CHLORIDE: 104 MMOL/L (ref 98–110)
CREATININE: 0.8 mg/dL (ref 0.4–1.24)
EGFR: >60 mL/min (ref >60)
GLUCOSE,PANEL: 106 mg/dL — ABNORMAL HIGH (ref 70–100)
POTASSIUM: 4.6 MMOL/L (ref 3.5–5.1)

## 2022-09-25 LAB — CBC
HEMATOCRIT: 43 % (ref 40–50)
HEMOGLOBIN: 14 g/dL (ref 13.5–16.5)
MCV: 96 FL (ref 80–100)
PLATELET COUNT: 238 10*3/uL — ABNORMAL HIGH (ref 150–400)
RBC COUNT: 4.4 M/UL (ref 4.4–5.5)
RDW: 14 % — ABNORMAL LOW (ref 11–15)
WBC COUNT: 6.6 10*3/uL (ref 4.5–11.0)

## 2022-09-25 MED ORDER — POLYETHYLENE GLYCOL 3350 17 GRAM PO PWPK
1 | Freq: Every day | GASTROSTOMY | 0 refills | Status: DC | PRN
Start: 2022-09-25 — End: 2022-09-28
  Administered 2022-09-27: 22:00:00 17 g via GASTROSTOMY

## 2022-09-25 MED ORDER — MELATONIN 5 MG PO TAB
5 mg | Freq: Every evening | GASTROSTOMY | 0 refills | Status: DC | PRN
Start: 2022-09-25 — End: 2022-09-28

## 2022-09-25 MED ORDER — GABAPENTIN 300 MG PO CAP
300 mg | Freq: Every evening | GASTROSTOMY | 0 refills | Status: DC
Start: 2022-09-25 — End: 2022-09-28
  Administered 2022-09-26 – 2022-09-28 (×3): 300 mg via GASTROSTOMY

## 2022-09-25 MED ORDER — ACETAMINOPHEN 500 MG PO TAB
1000 mg | ORAL | 0 refills | Status: DC | PRN
Start: 2022-09-25 — End: 2022-09-25

## 2022-09-25 MED ORDER — RIVAROXABAN 10 MG PO TAB
10 mg | Freq: Every day | NASOGASTRIC | 0 refills | Status: DC
Start: 2022-09-25 — End: 2022-09-28
  Administered 2022-09-26 – 2022-09-27 (×2): 10 mg via NASOGASTRIC

## 2022-09-25 MED ORDER — CALCIUM CARBONATE 200 MG CALCIUM (500 MG) PO CHEW
500 mg | GASTROSTOMY | 0 refills | Status: DC | PRN
Start: 2022-09-25 — End: 2022-09-28

## 2022-09-25 MED ORDER — ACETAMINOPHEN 160 MG/5 ML PO SOLN
1000 mg | GASTROSTOMY | 0 refills | Status: DC | PRN
Start: 2022-09-25 — End: 2022-09-28
  Administered 2022-09-25: 17:00:00 1000 mg via GASTROSTOMY

## 2022-09-25 MED ORDER — TUBE FEED (ENAR)
0 refills | Status: DC
Start: 2022-09-25 — End: 2022-09-26

## 2022-09-25 MED ORDER — PANCRELIPASE-SODIUM BICARBONATE 20,880 K UNIT-650 MG
GASTROSTOMY | 0 refills | Status: DC | PRN
Start: 2022-09-25 — End: 2022-09-28

## 2022-09-25 MED ORDER — FINASTERIDE 5 MG PO TAB
5 mg | Freq: Every day | GASTROSTOMY | 0 refills | Status: DC
Start: 2022-09-25 — End: 2022-09-28
  Administered 2022-09-25 – 2022-09-27 (×3): 5 mg via GASTROSTOMY

## 2022-09-25 MED ORDER — LEVOTHYROXINE 50 MCG PO TAB
100 ug | Freq: Every day | GASTROSTOMY | 0 refills | Status: DC
Start: 2022-09-25 — End: 2022-09-28
  Administered 2022-09-26 – 2022-09-28 (×3): 100 ug via GASTROSTOMY

## 2022-09-25 MED ORDER — LEVETIRACETAM 100 MG/ML PO SOLN
1000 mg | Freq: Two times a day (BID) | GASTROSTOMY | 0 refills | Status: DC
Start: 2022-09-25 — End: 2022-09-28
  Administered 2022-09-25 – 2022-09-28 (×6): 1000 mg via GASTROSTOMY

## 2022-09-25 NOTE — Progress Notes
OCCUPATIONAL THERAPY  ASSESSMENT NOTE      Name: Brendan Acosta   MRN: [id number]     DOB: 1931/03/18      Age: 89 y.o.  Admission Date: 09/24/2022     LOS: 1 day     Date of Service: 09/25/2022      Mobility  Patient Turn/Position: Weight shifted (Chair)  Progressive Mobility Level: Walk in room  Distance Walked (feet): 20 ft  Level of Assistance: Assist X1  Assistive Device: Walker  Activity Limited By: Weakness    Subjective  Significant Hospital Events: 89 year old man with history of LLE DVT on Xarelto who had a recent fall and L SDH, who is admitted for L MMA embolization. After procedure he was initially at baseline but then around 1530 had speech difficulty/garbled speech.  Mental / Cognitive: Alert;Distractible;Inconsistent with command following  Pain Interventions: Patient agrees to participate in therapy with current pain level;Patient assisted into position of comfort    Home Living Situation  Lives With: Spouse/significant other  Type of Home: House  In-Home Stairs: 1 flight;Stair glide  Comments: Due to aphasia PLOF and home living limited.    Prior Level of Function  Comments: per chart review pt was at [facility] in June however pt indicates he was home prior to [facility] embolization    Vision  Corrective Lenses: No hx of corrective lenses  Visual Screen Results: No Visual Deficits  Comment: Pt denies changes in vision. Will f/u when family present    ADL's  Where Assessed: Edge of Bed  LE Dressing Assist: Moderate Assist  LE Dressing Deficits: Don/Doff R Sock;Don/Doff L Sock  Comment: Minimum assist to don L sock, total assist to don R sock. RLE more swollen than L. RN aware    ADL Mobility  Bed Mobility: Supine to Sit: Minimal assist  Bed Mobility Comments: Impulsive. Requires cues for safety.  Transfer Type: Sit to stand  Transfer: Assistance Level: From;Bed;Moderate assist  Transfer: Assistive Device: Agricultural consultant: Type of Assistance: For balance;For safety considerations  End of Activity Status: Up in chair;Instructed patient to use call light  Sitting Balance: Standby assist  Standing Balance: Minimal assist;2 UE support  Gait Distance: 15 feet  Gait: Assistance Level: Minimal assist  Gait: Assistive Device: Roller walker  Gait Comments: Minor losses of balance, distracted by EEG lines. Cues for safety and walker management.    Activity Tolerance  Endurance: 2/5 Tolerates 10-20 Minutes Exercise w/Multiple Rests    Cognition  Overall Cognitive Status: Impaired  Comprehension: Receptive Aphasia  Expression: Expressive Aphasia  Memory: Unable to Provide Accurate Prior Level of Functioning History;Unable to Recall Daily Events  Orientation: Alert & Oriented x1  Attention: Awake/Alert    ROM  R UE ROM: WFL   R UE ROM Method: Active  L UE ROM: WFL   L UE ROM Method: Active    UE Strength / Tone  R UE Strength: WFL  L UE Strength: WFL    Education  Persons Educated: Patient  Barriers To Learning: None Noted  Teaching Methods: Verbal Instruction  Patient Response: Verbalized Understanding  Topics: Role of OT, Goals for Therapy;Home safety  Goal Formulation: With Patient    Assessment  Assessment: Decreased ADL Status;Decreased Endurance;Decreased High-Level ADLs  Prognosis: Good;w/Cont OT s/p Acute Discharge  Goal Formulation: Patient  Comments: Pt demonstrates decreased safety with mobility and requires assistance with ADLs. Anticipate pt to require inpatient setting at discharge.    AM-PAC 6  Clicks Daily Activity Inpatient  Putting on and taking off regular lower body clothes: A Lot  Bathing (Including washing, rinsing, drying): A Lot  Toileting, which includes using toilet, bedpan, or urinal: A Lot  Putting on and taking off regular upper body clothing: A Little  Taking care of personal grooming such as brushing teeth: A Little  Eating meals: Total  Daily Activity Raw Score: 13  Standardized (T-scale) Score: 32.03    Plan  OT Frequency: 3-5x/week  OT Plan for Next Visit: standing grooming, toileting in the bathroom.    ADL Goals  Patient Will Perform All ADL's: w/ Stand By Assist  Patient Will Perform Toileting: w/ Stand By Assist    Functional Transfer Goals  Pt Will Perform All Functional Transfers: w/ Stand By Assist    OT Discharge Recommendations  Recommendation: Inpatient setting      Therapist: Roxy Horseman, OTR/L [id number]  Date: 09/25/2022

## 2022-09-25 NOTE — Progress Notes
Neurosurgery Progress Note      Admission Date: 09/24/2022 LOS: 1 day  ______________________________________________________________    S: Seen this AM and discussed with Dr. Vonita Moss. Resting in bed. Denies any needs.       O:                  Vital Signs: 24 Hour Range   BP: (91-132)/(50-81)   Temp:  [36.3 ?C (97.3 ?F)-36.5 ?C (97.7 ?F)]   Pulse:  [56-79]   Respirations:  [9 PER MINUTE-22 PER MINUTE]   SpO2:  [96 %-100 %]   O2 Device: None (Room air)     Physical Exam:  Awake and alert  Oriented to person and place; not year   Dysarthric speech   MAE, follows commands   EEG leads in place       A/P: 89 y.o. male   Principal Problem:    SDH (subdural hematoma) (HCC)    Continue current care   Neurology Stroke following   >vEEG overnight  >continue Keppra 1 gm BID  >MRI/MRA head after EEG complete  Labs reviewed:   >Hgb14.1; Plt 238  >Wbc 6.6  >Na 138  PT/OT/SLP   Pain control PRN  Plan formulating -- CM/SW  involved      Prophylaxis:   A) GI: None  B) Lines:  No  C) Urinary Catheter:  No  D) Antibiotic Usage:  No  E) VTE:  Mechanical prophylaxis; Sequential compression device  F) Restraints: Patient assessed for need for restraints.     Please page [phone] with any questions.    Lelan Pons, APRN-NP  Voalte me     Total Time Today was 35 minutes in the following activities: Preparing to see the patient, Performing a medically appropriate examination and/or evaluation, Counseling and educating the patient/family/caregiver, Ordering medications, tests, or procedures, Documenting clinical information in the electronic or other health record, and Care coordination (not separately reported)

## 2022-09-25 NOTE — Progress Notes
Small Bore Feeding Tube Placement    Procedure was discussed with patient    Time Placed: 2115  Size of Tube: 10FR  Length of Tube: 140CM    Physician's order received for feeding tube placement. Placed small bore feeding tube in patient's right nare with Cortrak tracking device. 2 attempt/s made to place tube; air bolus/abdominal massage utilized in attempt to advance tube. Tube advanced to 73 centimeter mark on tube, and secured with nasal bridle to the patient's nose. Sue Lush, RN at bedside to assist. Nurse to confirm correct tube placement via KUB with primary team physician. Procedure completed without complications.     PT was a difficult placement. Extreme difficulty to pass corpak into stomach, corpak continuously was coiling in mouth due to the resistance met at gastric entry point on cortrack machine. PT is unsure if he has a hiatal hernia.

## 2022-09-25 NOTE — Case Management (ED)
Case Management Admission Assessment    NAME:Brendan Acosta                          MRN: [id number]             DOB:[birth year]          AGE: 89 y.o.  ADMISSION DATE: 09/24/2022             DAYS ADMITTED: LOS: 1 day      Today?s Date: 09/25/2022    Source of Information: Patient       Plan  Plan: Case Management Assessment, Psychosocial Assessment, Assist PRN with SW/NCM Services, Discharge Planning for Post-Acute Facility    Plan: Case Management Assessment, Assist PRN with SW/NCM Services, Discharge Planning for Home with Post-Acute Care Needs  Most recent therapy recommendations:  PT: waiting on recs  OT: inpatient setting   ST: not consulted  CM needs are not fully known, SNF  placement  NCM/SW team to continue to follow patient's plan of care via EMR and team huddle; will assist with discharge planning needs as indicated.    Assessment Notes  Patient is agreeable to completing assessment at this time.   SW provided contact information, explanation of CM roles, and general review of Preparing for Discharge, [facility] Preferred Provider Network handouts. Patient encouraged to contact case management with questions and concerns during hospitalization.   Patient lives with wife.  Grandson also has moved into assist pt and wife at home.   The home accomodates single-level living. [name] he has a chair lift to help go downstairs if needed.  The home has 2STE  Patient is typically independent in all ADLs and mobility without devices.  Has a RW, w/c.   Home support is assessed to be intermittent, consistent if needed.   Patient's previous HH, LTACH, SNF, IPR, DME, outpatient therapy experience includes:  SNF  been to [facility] in June 2024 and would return   DME  w/c, RW  Transport plan will be facility  Pt fills medications at [facility], tried to say another pharmacy but could not understand pt's words.  [name] he would go back to [facility] if needing rehab again.     Patient Address/Phone  [address]  [place] [address]  [phone] (home)     Emergency Contact  Extended Emergency Contact Information  Primary Emergency Contact: Piasecki,Deborah  Mobile Phone: [phone]  Relation: Daughter  Secondary Emergency Contact: Pagliarulo,Dianne  Address: [address]           [place], [place] [address] [place]  Mobile Phone: [phone]  Relation: Spouse    Network engineer Directive: Yes, patient has a healthcare directive  Type of Healthcare Directive: Durable power of attorney for healthcare  Location of Healthcare Directive: Patient does not have it with him/her  Would patient like to fill out a (a new) Healthcare Directive?: No, patient declined  Psych Advance Directive (Psych unit only): No, patient does not have a Social research officer, government  Will the Patient Use Family Transport?: No  Transportation Name, Phone and Availability #1: facility to provide transportation    Expected Discharge Date  09/29/2022 12:00 PM    Living Situation Prior to Admission  Living Arrangements  Type of Residence: Home, independent  Living Arrangements: Spouse/significant other  Financial risk analyst / Tub: Tub/Shower Unit, Walk-in Shower  How many levels in the residence?:  2  Can patient live on one level if needed?: Yes  Does residence have entry and/or inside stairs?: Yes (2 STE has a chair lift to go downstairs)  Assistance needed prior to admit or anticipated on discharge: Yes  Who provides assistance or could if needed?: possibly wife, grandson and wife have moved into assist  Are they in good health?: Yes  Can support system provide 24/7 care if needed?: Maybe  Level of Function   Prior level of function: Independent  Cognitive Abilities   Cognitive Abilities: Continue to Assess (completed with pt; had trouble with some speech and answering questions)    Financial Resources  Coverage  Primary Insurance: Medicare Replacement  Secondary Insurance: Surveyor, mining)  Medication Coverage Source of Income   Source Of Income: Other retirement income  Financial Assistance Needed?  NA    Psychosocial Needs  Mental Health  Mental Health History: No  Substance Use History  Substance Use History Screen: No  Other  NA    Current/Previous Services  PCP  Wannetta Sender, [phone], [phone]  Pharmacy    [facility] - [place], [place] - [address]  [address]  [place] [address]  Phone: [phone] Fax: [phone]    [id number] [place], [place] - [address] AT [facility] OF [address]  [address]  [place] [address]  Phone: [phone] Fax: [phone]    [facility] - [place], [place] - [address]  [address]  [place] [address]  Phone: [phone] Fax: [phone] Alternate Fax: [phone]    Durable Medical Equipment   Durable Medical Equipment at home: Bernardo Heater, Wheelchair (manual)  Home Health  Receiving home health: Yes  Agency name: wasn't sure the name of the agency  Would patient use this agency again?: Yes  Hemodialysis or Peritoneal Dialysis  Undergoing hemodialysis or peritoneal dialysis: No  Tube/Enteral Feeds  Receive tube/enteral feeds: No  Infusion  Receive infusions: No  Private Duty  Private duty help used: No  Home and Community Based Services  Home and community based services: No  Ryan White  Ryan White: N/A  Hospice  Hospice: No  Outpatient Therapy  PT: No  OT: No  SLP: No  Skilled Nursing Facility/Nursing Home  SNF: In the past  When did patient receive care?: 6/24  Name of Facility: [facility]  Would patient return for future services?: Yes  NH: No  Inpatient Rehab  IPR: No  Long-Term Acute Care Hospital  LTACH: No  Acute Hospital Stay  Acute Hospital Stay: No      Lorayne Marek, LMSW  Social Work Case Management  Available on [place]  Work Cell [phone]

## 2022-09-25 NOTE — Case Management (ED)
Case Management Progress Note    NAME:Brendan Acosta                          MRN: [id number]              DOB:1931-09-03          AGE: 89 y.o.  ADMISSION DATE: 09/24/2022             DAYS ADMITTED: LOS: 1 day      Today's Date: 09/25/2022    PLAN: Anticipate dc to SNF pending medical stability, facility acceptance and insurance auth.      Expected Discharge Date: 09/29/2022 12:00 PM  Is Patient Medically Stable: No, Please explain: needs tube feeds currently  Are there Barriers to Discharge? no    INTERVENTION/DISPOSITION:  Discharge Planning        SW talked to pt about past placement experience and he said he went to   [place] not very long ago.  He would be open to going back if he has those recommendations.      SW reviewed EMR and pt has inpatient setting recs. SW sent a referral to [facility].    Team said he will be here through the weekend.   Won't submit for auth until closer to discharge ready.          Transportation              Will the Patient Use Family Transport?: No  Transportation Name, Phone and Availability #1: facility to provide transportation  Support                 Info or Referral                 Positive SDOH Domains and Potential Barriers                   Medication Needs                                  Financial                 Legal                 Other                 Discharge Disposition                                   Selected Continued Care - Admitted Since 09/24/2022    No services have been selected for the patient.           Lorayne Marek, LMSW  Social Work Case Management  Available on Voalte  Work Cell [phone]

## 2022-09-25 NOTE — Progress Notes
SPEECH-LANGUAGE PATHOLOGY  CLINICAL SWALLOW ASSESSMENT     Name: Brendan Acosta   MRN: [id number]     DOB: 04-19-1931      Age: 89 y.o.  Admission Date: 09/24/2022     LOS: 1 day     Date of Service: 09/25/2022    Evaluation Summary   Clinical swallow evaluation completed.   Pt known to this department. Pt participated in video swallow study completed on 08/05/22 and presented with moderate oropharyngeal dysphagia. Oral stage characterized by slowed transit with repetitive lingual movements and residues.  The patient was noted to have nasopharyngeal escape of the bolus intermittently.   Penetration/aspiration observed secondary to poor laryngeal vestibule closure, pharyngeal residues, and mis-timing of the swallow.  Patient with weak throat clear in response to aspiration which was intermittently effective to clear sub-glottic material however residues remained in the laryngeal vestibule. Two tracks of care were provided: Option 1: Consider long term non-oral nutrition if nearing d/c and Option 2: Trial of diet (pureed solids/moderately thick liquids) with risk of aspiration.  Per pt report, pt has been tolerating regular solids and thin liquids prior to hospitalization.     Pt presented with mild dysarthria at bedside this date. Pt inconsistent with command following.  Pt demonstrated a throat clear following majority of thin and mildly thick liquid trials. Pt demonstrated x1 cough response following x1 trial of thin liquids. Anticipate pt will require an instrumental evaluation of swallowing before initiation of diet. Department will continue to f/u to determine readiness for instrumental evaluation.   Clinical Impression: Moderate to severe dysphagia  Sources of Dysphagia: Baseline dysphagia, COPD, Overall medical course ( h/o chronic subdural hematoma, DVT on AC, s/p IVC filter, CAD, hypothyroidism, RLS, BPH, HLD admitted for L MMA embolization for chronic subdural hematoma )    Swallow Recommendations  PO: Ice chips only  NPO: Continue short term non-oral nutrition  Medications:  (Non oral means)  Ice Chip Trials: 10-15 per hour  Supervision: 1:1  Positioning: Upright 90 degrees or chair mode  Oral Hygiene: 3 times per day, Complete oral care to minimize the risk of aspirating oral bacteria    PO Presentation  Presentations: Therapist Fed, Patient Fed Self  Thin Liquid: 1 Tsp, Straw, Consecutive Swallows  Mildly Thick Liquid: 1 Tsp, Straw, Consecutive Swallows  Other Consistencies: Ice chips, Puree    Clinical Interpretation of Oral Stage  Withdraw Bolus: WFL  Form Bolus: Slowed, Incoordinated  Masticate Bolus: Did not trial masticated solids due to safety concerns (Comment)  Transfer Bolus: Suspect early spillover  Anterior Bolus Spillage: None  Oral Residue: None    Clinical Interpretation of Pharyngeal Stage  Swallow Initiation: Delayed  Laryngeal Elevation: Suspected to be reduced  Signs / Symptoms Of Aspiration: Thin liquid, Mildly thick liquid  Thin Liquid: Throat clear, Cough  Mildly Thick Liquid: Throat clear  Suspected Pharyngeal Stage Impairment: Decreased laryngeal vestibule closure, Incomplete pharyngeal clearance, Mistimed airway protection, Reduced vocal fold closure  Pharyngeal Stage Summary: Pt demonstrated a throat clear following majority of thin and mildly thick liquid trials. Pt demonstrated x1 cough response following x1 trial of thin liquids.    Oral Mech Exam  Oral Mech WFL: No  Lips: Impaired Coordination  Tongue: Impaired Coordination, Impaired Strength - Bilateral  Jaw: WFL  Vocal Quality:  (Pt presents with mild dysarthria.)  Dentition: Natural - Good    Attempted Swallow Strategies  Small Bites/Sips:  (intermittently effective)  Slow Rate of Intake:  (intermittently effective)  Subjective  Reason for Admission and Past Medical Hx: Brendan Acosta is a 89 y.o. male with h/o chronic subdural hematoma, DVT on AC, s/p IVC filter, CAD, hypothyroidism, RLS, BPH, HLD admitted for L MMA embolization for chronic subdural hematoma. Had a fall in June which resulted in a L sided frontal SDH, L SAH, R IVH. Was admitted for this and hemorrhages were found to be stable. Was started on Keppra at this time, Xarelto was continued due to his R leg DVT. Reportedly had been having episodes of garbled speech at home which would last up to 30 minutes. Underwent MMA embolization on 8/8 and reportedly was speaking normally following procedure. LKW was 1525 on 8/8, at which time he began speaking abnormally with garbled speech and was dysarthric. Also R facial droop was noted.  Mental / Cognitive: Alert, Distractible, Inconsistent with command following    Imaging  CT HEAD WO CONTRAST  Result Date: 09/24/2022  1. Indwelling left middle meningeal artery coils due to the procedure performed shortly before this exam. 2. Chronic left subdural hematoma. Internal hyperdense areas may be due to contrast retention or recent areas of hemorrhage 3. Stable generalized cerebral and cerebellar volume loss. Stable chronic microvascular ischemic changes. 4. No definite areas of loss of the peripheral gray-white differentiation. The findings were discussed with Dr. Smith Robert at 3:54 PM by Dr. Hector Brunswick.  Finalized by Marily Memos, M.D. on 09/24/2022 4:46 PM. Dictated by Marily Memos, M.D. on 09/24/2022 3:56 PM.     Nutrition  Nutrition Prior To Hospitalization: Oral, Regular, Thin Liquids (Per pt report)  Current Form Of Nutrition: NPO (Corpak)    Education  Persons Educated: Patient  Barriers To Learning: Cognitive Deficits, Impaired Communication  Interventions: Staff Educated, Repetition of Instructions  Teaching Methods: Verbal  Topics: Dysphagia  Patient Response: Verbalized Understanding, More Instruction Required  Goal Formulation: With Patient    Assessment/Prognosis  Plan: 3-5 x/week     NOMS Dysphagia Rating: 2-Moderately-Severe Dysphagia -Not able to swallow safely by mouth for nutrition/hydration but may take some consistency w/ consistent max cues in therapy only. Alternative method of feeding required.    Clinical Swallow Goals  Goal : Pt will tolerate ice chip protocol free from negative pulmonary status changes.  Goal : Pt will participate in ongoing swallow evaluation given min cues.  Goal : Pt will participate in a speech/language evaluation, as indicated, given min cues.    Therapist: Darci Needle, MS, CCC-SLP   Date:09/25/2022

## 2022-09-25 NOTE — Care Coordination-Inpatient
Brief vEEG hookup note:    Reviewed from 19:08 to 2001. Technically difficult study due to abundance of muscle artifact obscuring large amounts of the background. Severe generalized background slowing seen. No seizures. Full report to follow in am.    Brendan Acosta [facility]  Department of Neurology

## 2022-09-25 NOTE — Progress Notes
CT compatible EEG Electrodes have been applied to the patient's scalp for the purposes of Video EEG Monitoring by Ander Slade. REEGT, CLTM.  Video, sound and EEG tracings are all functioning properly.  The quality and integrity of the electrode connection has been checked and is within the designated range of below 5,000 Ohms.     Tap test was performed. The event button was tested and is in working order. The data is recording and is on-line making it viewable remotely.   All questions have been answered and there are no further questions or needs at this time.

## 2022-09-25 NOTE — Progress Notes
Vascular Neurology Progress Note    Admission Date: 09/24/2022                                                LOS: 1 day    Principal Problem:    SDH (subdural hematoma) (HCC)       Assessment:  Brendan Acosta is a 89 y.o. 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 After procedure he was initially at baseline but then around 1530 had speech difficulty/garbled speech and was stroke activated. Currently on Keppra 1 g twice daily for concern of seizures. Stroke activated on 8/08 for     CT brain with chronic L SDH, indwelling coils from MMA embolization, no recent stroke  CTA head/neck shows some paucity in superior L MCA branches, no proximal occlusion or high grade stenosis    Suspected Etiology: Unclear, sequela from SDH vs small acute stroke vs acute encephalopathy     Current Exam: Alert and interactive. Follows commands and speech is fluent but with mild disorientation. Moves all extremities equally. No gaze deviation or visual field cut.       8/9 Recommendations:  - vEEG no evidence of epileptiform activity but left hemispheric slowing   - ok to DC EEG, continue on Keppra for now  - MRI head and MRA head pending completion of EEG  - Xarelto reportedly planned to start on 8/10, NSG following  - SLP recs continue non oral nutrition  - Stroke team will follow along and provide further recommendations as imaging is completed      Patient was seen and examined by myself and Dr. Consuella Lose who agrees with the plan.    Wess Botts, APRN-NP  [phone] or Voalte  ______________________________________________________________________    Subjective:  Brendan Acosta was sitting up in a chair on rounds. vEEG electrodes appeared in place with ongoing monitoring. Patient was in no apparent discomfort. Speech is much better with mild disorientation.       Scheduled Meds:finasteride (PROSCAR) tablet 5 mg, 5 mg, Feeding Tube, QDAY  gabapentin (NEURONTIN) capsule 300 mg, 300 mg, Feeding Tube, QHS  levETIRAcetam (KEPPRA) oral solution 1,000 mg, 1,000 mg, Feeding Tube, BID  [START ON 09/26/2022] levothyroxine (SYNTHROID) tablet 100 mcg, 100 mcg, Feeding Tube, QDAY 30 min before breakfast  [START ON 09/26/2022] rivaroxaban (XARELTO) tablet 10 mg, 10 mg, Per Corpak Tube, QDAY w/breakfast  rOPINIRole (REQUIP) tablet 3 mg, 3 mg, Oral, BID  timolol maleate (TIMOPTIC) 0.25 % ophthalmic solution 1 drop, 1 drop, Both Eyes, BID    Continuous Infusions:   Diet Enteral Feeding Standard Infusion       PRN and Respiratory Meds:acetaminophen Q6H PRN, calcium carbonate Q4H PRN, melatonin QHS PRN, ondansetron Q6H PRN **OR** ondansetron (ZOFRAN) IV Q6H PRN, pancrelipase 20,880 Units/sodium bicarbonate 650 mg ([place] CLOG DESTROYER) PRN (On Call from Rx), polyethylene glycol 3350 QDAY PRN, sennosides-docusate sodium QDAY PRN      Vital Signs:  Last Filed in 24 hours Vital Signs:  24 hour Range    BP: 93/59 (08/09 1200)  Temp: 36.4 ?C (97.6 ?F) (08/09 1200)  Pulse: 56 (08/09 1200)  Respirations: 16 PER MINUTE (08/09 1200)  SpO2: 100 % (08/09 1200)  O2 Device: None (Room air) (08/09 1200)  Height:  177.8 cm (5' 10) (08/08 2200) BP: (91-129)/(50-81)   Temp:  [36.3 ?C (97.3 ?F)-36.4 ?C (97.6 ?F)]   Pulse:  [56-79]   Respirations:  [14 PER MINUTE-16 PER MINUTE]   SpO2:  [96 %-100 %]   O2 Device: None (Room air)     General physical exam:    HEENT: normocephalic, eyes open with no discharge, nares patent, oropharynx is clear with no lesions, palate intact  CV: regular rate, distal pulses palpable, no dependent edema  Chest: normal configuration, equal chest rise bilaterally  Ab: soft, non-tender, no masses, no organomegaly  Skin: no rashes or lesions    Extended Neuro exam:    MS: alert, interactive, and oriented to self/place/time   Speech: fluent with mild dysarthria   CN II-XII intact with mild R facial droop   Cerebellar exam: no ataxia, dysmetria, or nystagmus   Strength: moves all 4 limbs spontaneously, 5/5 throughout   Sensation: Reportedly intact to touch   DTRs: 2/4 throughout, toes down going      NIH Stroke Scale Item  Scoring Definition  Score    1a. LOC  0=alert and responsive   1=arousable to minor stimulation   2=arousable only to painful stimulation   3=reflex responses or unrousable  0    1b. LOC questions-as patient?s age and month. Must be exact.  0=both correct   1=one correct (or dysarthria, intubated, foreign language)   2=neither correct  1    1c. Commands-open/close eyes, grip and release non-paretic hand (other 1 step commands or mimic OK)  0=both correct (ok if impaired by weakness)   1=one correct   2=neither correct  0    2. Best Gaze-horizontal EOM by voluntary or Doll?s  0=normal   1=partial gaze palsy (abnormal gaze in one or both eyes)   2=forced eye deviation or total paresis which cannot be overcome by Doll?s  0    3. Visual Field-use visual threat if necessary. If monocular, score field of good eye  0=no visual loss   1=partial hemianopia, quadrantanopia, extinction   2=complete hemianopia   3=bilateral hemianopia or blindness  0    4. Facial Palsy-if stuporous, check symmetry of grimace to pain  0=normal   1=minor paralysis, flat NLF, asymm smile   2=partial paralysis (lower face=UMN)   3=complete paralysis (upper and lower face)  1    5. Motor Arm-arms outstretched 90 deg (sitting) or 45 deg (supine) for 10 seconds. Encourage best effort.  0=no drift x 10 seconds   1=drift but doesn?t hit bed   2=some antigravity effort, but can?t sustain   3=no antigravity effort, but even minimal mvt counts   4=no movement at all   X=unable to assess due to amputation, fusion, etc  L/R   0/0    6. Motor Leg-raise leg to 30 degrees supine x 5 seconds  0=no drift x 5 seconds   1=drift but doesn?t hit bed   2=some antigravity effort, but can?t sustain   3=no antigravity effort, but even minimal mvt counts   4=no movement at all   X=unable to assess due to amputation, fusion, etc  L/R 0/0    7. Limb Ataxia-check finger-nose- finger; heel-shin; and score only if out of proportion to paralysis  0=no ataxia (or aphasic, hemiplegic)   1=ataxia in upper or lower extremity   2=ataxia in upper AND lower extremity   X=unable to assess due to amputation, fusion, etc  L/R   0/0    8. Sensory-use safety  pin. Check grimace or withdrawal if stuporous. Score only stroke- related losses  0=normal   1=mild-mod unilateral loss but patient aware of touch 9or aphasic, confused)   2=total loss, pt unaware of touch. Coma, bilateral loss  0   9. Best Language-describe cookie jar picture, name objects, read sentences. May use repeating, writing, stereognosis  0=normal   1=mild-mod aphasia (diff but partly comprehensible)   2=severe aphasia (almost no info exchanged)   3=mute, global aphasia, coma. No 1 step commands  0    10. Dysarthria-read list of words  0=normal   1=mild-mod; slurred but intelligible   2=severe; unintelligible or mute  1    11. Extinction/Neglect- simultaneously touch patient on both hands, show fingers in both visual fields, ask about deficit, left hand  0=normal, none detected. (visual loss alone)   1=neglects or extinguishes to double simult stimulation in any modality   2=profound neglect in more than one modality  0    Score    3        Lab/Radiology/Other Diagnostic Tests:  Hematology:    Lab Results   Component Value Date    HGB 14.1 09/25/2022    HCT 43.4 09/25/2022    PLTCT 238 09/25/2022    WBC 6.6 09/25/2022    MCV 96.8 09/25/2022    MCH 31.5 09/25/2022    MCHC 32.6 09/25/2022    MPV 7.5 09/25/2022    RDW 14.8 09/25/2022    and General Chemistry:    Lab Results   Component Value Date    NA 138 09/25/2022    K 4.6 09/25/2022    CL 104 09/25/2022    CO2 19 09/25/2022    GAP 15 09/25/2022    BUN 21 09/25/2022    CR 0.85 09/25/2022    GLU 106 09/25/2022    CA 10.1 09/25/2022    ALBUMIN 4.4 08/02/2022    LACTIC 1.4 08/01/2022    OBSCA 1.18 08/05/2022    MG 2.2 08/06/2022    TOTBILI 1.2 08/02/2022    PO4 2.0 08/06/2022         Pertinent radiological imaging has been reviewed by the Vascular Neurology team.    Marcos Eke, APRN-NP  Vascular Neurology  [phone] or [facility]

## 2022-09-25 NOTE — Progress Notes
Informed Dr. Francetta Found that patient failed with swallow screen with stroke activation earlier. Ordered to place a Corpak.    22:24 As per Above mentioned MD Xray has been reviewed and okay to use Corpak. Asked as well if we can have soft mitten ordered because he started to pull off EEG.   Did not initiate the soft mitten and had a conversation with the patient not to pull off the EEG leads and his Corpak. Patient agreed and will continue to monitor patient.

## 2022-09-25 NOTE — ED Notes (Signed)
This RN called and spoke with Melissa At [facility] to return prior call.  Update provided on pt status.  Melissa to update pt's wife at facility as well.

## 2022-09-25 NOTE — ED Notes (Signed)
Pt cleaned following small bowel movement.  New brief placed on pt.

## 2022-09-25 NOTE — ED Triage Notes (Signed)
Chief Complaint  Patient presents with   Altered Mental Status   Pt presents to ED 9 via EMS from [facility] with above.  Family noticed drastic cognitive decline in pt this morning; pt was unable to feed self or recognize people he normally knows that persisted during day.  Upon EMS arrival, pt was able to recognize family and Facilities manager at facility.  History of dementia.  Pt has some left-sided deficits per EMS and is non-ambulatory at baseline.

## 2022-09-25 NOTE — Discharge Instructions (Addendum)
It was a pleasure caring for you today in the emergency department.  Please follow closely with PCP  Please return to the emergency department for any worsening or worrisome symptoms.

## 2022-09-25 NOTE — ED Provider Notes (Signed)
[place] [facility]ERGENC[facility] DEPARTMENT AT [facility] Provider Note   CSN: [id number] Arrival date & time: 09/25/22  1911     History  Chief Complaint  Patient presents with   Altered Mental Status    TALEB CARMICAL is a 89 y.o. male with a history of seizures, left sided hemiparesis after CVA, vascular dementia, paroxysmal atrial fibrillation, and CHF who presents to the ED today via EMS for AMS.  Patient lives at [facility]  At [place] assisted living facility and was visiting with his family this morning when they noticed some cognitive decline with him.  He was unable to recognize his family members at the time.  He also was reported to be unable to feed himself or recognize other members of the staff and community.  Per EMS, patient has a history of dementia. Patient denies any falls but admits to right shoulder pain with movement. No other complaints or concerns at this time.    Home Medications Prior to Admission medications   Medication Sig Start Date End Date Taking? Authorizing Provider  acetaminophen (TYLENOL) 325 MG tablet Take 650 mg by mouth in the morning, at noon, and at bedtime.   Yes [provider]  albuterol (PROVENTIL) (2.5 MG/3ML) 0.083% nebulizer solution Take 2.5 mg by nebulization daily.   Yes [provider]  ammonium lactate (LAC-HYDRIN FIVE) 5 % LOTN lotion Apply 1 Application topically daily.   Yes [provider]  ammonium lactate (LAC-HYDRIN FIVE) 5 % LOTN lotion Apply 1 Application topically daily as needed for dry skin.   Yes [provider]  aspirin EC 81 MG tablet Take 81 mg by mouth daily. Swallow whole.   Yes [provider]  b complex vitamins capsule Take 1 capsule by mouth daily.   Yes [provider]  Beclomethasone Diprop HFA (QVAR REDIHALER IN) Inhale 2 puffs into the lungs in the morning and at bedtime.   Yes [provider]  bisacodyl (DULCOLAX) 10 MG suppository Place 10  mg rectally as needed for moderate constipation.   Yes [provider]  carboxymethylcellulose (REFRESH PLUS) 0.5 % SOLN Place 1 drop into both eyes in the morning, at noon, in the evening, and at bedtime.   Yes [provider]  carvedilol (COREG) 3.125 MG tablet Take 1 tablet (3.125 mg total) by mouth 2 (two) times daily with a meal. 03/12/21  Yes Elgergawy, Leana Roe, MD  Cholecalciferol (VITAMIN D-3 PO) Take 1 tablet by mouth in the morning.   Yes [provider]  ciprofloxacin-dexamethasone (CIPRODEX) OTIC suspension Place 4 drops into both ears 2 (two) times daily as needed (S/P tubing placement.).   Yes [provider]  divalproex (DEPAKOTE) 125 MG DR tablet Take 250 mg by mouth daily.   Yes [provider]  divalproex (DEPAKOTE) 125 MG DR tablet Take 500 mg by mouth every evening.   Yes [provider]  erythromycin ophthalmic ointment Place 1 Application into the left eye at bedtime.   Yes [provider]  esomeprazole (NEXIUM) 40 MG packet Take 40 mg by mouth daily before breakfast.   Yes [provider]  HYDROcodone-acetaminophen (NORCO/VICODIN) 5-325 MG tablet Take 1 tablet by mouth in the morning, at noon, in the evening, and at bedtime. 10/09/21  Yes [provider]  Magnesium 300 MG CAPS Take 300 mg by mouth in the morning and at bedtime.   Yes [provider]  miconazole (MICOTIN) 2 % cream Apply 1 Application  topically every 8 (eight) hours as needed (incontinence).   Yes [provider]  midodrine (PROAMATINE) 2.5 MG tablet Take 2.5 mg by mouth daily as needed (blood pressure <100).   Yes [provider]  polyethylene glycol (MIRALAX / GLYCOLAX) 17 g packet Take 17 g by mouth every other day.   Yes [provider]  predniSONE (DELTASONE) 10 MG tablet Take 5 mg by mouth daily.  07/11/19  Yes [provider]  psyllium (METAMUCIL) 58.6 % packet Take 1 packet by mouth  every other day.   Yes [provider]  rOPINIRole (REQUIP) 4 MG tablet Take 1 tablet (4 mg total) by mouth at bedtime. 11/03/21  Yes McCue, Shanda Bumps, NP  sennosides-docusate sodium (SENOKOT-S) 8.6-50 MG tablet Take 2 tablets by mouth 2 (two) times daily.   Yes [provider]  simvastatin (ZOCOR) 40 MG tablet Take 1 tablet (40 mg total) by mouth daily. Patient taking differently: Take 40 mg by mouth at bedtime. 02/26/16  Yes Amin, Ankit Chirag, MD  tamsulosin (FLOMAX) 0.4 MG CAPS capsule Take 0.8 mg by mouth daily.   Yes [provider]  traZODone (DESYREL) 25 mg TABS tablet Take 25 mg by mouth at bedtime.   Yes [provider]  traZODone (DESYREL) 50 MG tablet Take 50 mg by mouth at bedtime.   Yes [provider]  divalproex (DEPAKOTE SPRINKLE) 125 MG capsule Take 2 capsules (250 mg total) by mouth every morning AND 4 capsules (500 mg total) at bedtime. Taking 2 capsules ( 250 mg) in the AM and 4 capsules ( 500 mg) in the evening. Patient not taking: Reported on 09/25/2022 12/22/21   Ihor Austin, NP  ferrous sulfate 325 (65 FE) MG tablet Take 1 tablet (325 mg total) by mouth 2 (two) times daily. Patient not taking: Reported on 09/25/2022 08/04/21   Regalado, Jon Billings A, MD  pantoprazole (PROTONIX) 40 MG tablet Take 1 tablet (40 mg total) by mouth daily with breakfast. Patient not taking: Reported on 09/25/2022 02/04/21   Sammuel Cooper, PA-C      Allergies    Tape    Review of Systems   Review of Systems  Neurological:        Altered mental status  All other systems reviewed and are negative.   Physical Exam Updated Vital Signs BP (!) 149/58 (BP Location: Right Arm)   Pulse (!) 50   Resp 17   Ht 5\' 10"  (1.778 m)   Wt 67.1 kg   SpO2 98%   BMI 21.23 kg/m  Physical Exam Vitals and nursing note reviewed. Exam conducted with a chaperone present.  Constitutional:      Appearance: Normal appearance.  HENT:     Head: Normocephalic and  atraumatic.     Mouth/Throat:     Mouth: Mucous membranes are moist.  Eyes:     Conjunctiva/sclera: Conjunctivae normal.     Pupils: Pupils are equal, round, and reactive to light.  Cardiovascular:     Rate and Rhythm: Normal rate and regular rhythm.     Pulses: Normal pulses.     Heart sounds: Normal heart sounds.  Pulmonary:     Effort: Pulmonary effort is normal.     Breath sounds: Normal breath sounds.  Abdominal:     Palpations: Abdomen is soft.     Tenderness: There is no abdominal tenderness.  Musculoskeletal:        General: Tenderness present.     Right lower leg: No edema.  Left lower leg: No edema.     Comments: Tenderness to palpation of right shoulder with ROM intact.  Skin:    General: Skin is warm and dry.     Findings: No rash.  Neurological:     Mental Status: He is alert.     Motor: Weakness present.     Comments: Left sided weakness at baseline  Psychiatric:        Mood and Affect: Mood normal.        Behavior: Behavior normal.    Lenna Sciara (Paramedic) present at [facility]. ED Results / Procedures / Treatments   Labs (all labs ordered are listed, but only abnormal results are displayed) Labs Reviewed  COMPREHENSIVE METABOLIC PANEL - Abnormal; Notable for the following components:      Result Value   Calcium 8.4 (*)    Total Protein 5.6 (*)    Albumin 2.8 (*)    AST 14 (*)    All other components within normal limits  CBC WITH DIFFERENTIAL/PLATELET - Abnormal; Notable for the following components:   RBC 3.11 (*)    Hemoglobin 8.4 (*)    HCT 27.1 (*)    RDW 16.3 (*)    Platelets 458 (*)    All other components within normal limits  URINALYSIS, W/ REFLEX TO CULTURE (INFECTION SUSPECTED)  POC OCCULT BLOOD, ED    EKG EKG Interpretation Date/Time:  Friday September 25 2022 20:16:55 EDT Ventricular Rate:  75 PR Interval:    QRS Duration:  96 QT Interval:  437 QTC Calculation: 378 R Axis:   -17  Text Interpretation: Multiple ventricular  premature complexes Borderline left axis deviation Probable anteroseptal infarct, old similar to prior, 10/18 Confirmed by Tanda Rockers ([id number]) on 09/25/2022 10:20:49 PM  Radiology DG Chest Portable 1 View  Result Date: 09/25/2022 CLINICAL DATA:  Chest pain EXAM: PORTABLE CHEST 1 VIEW COMPARISON:  06/23/2021 FINDINGS: Check shadow is stable. Aortic calcifications are again seen. Lungs are clear bilaterally. No bony abnormality is noted. IMPRESSION: No acute abnormality noted. Electronically Signed   By: Alcide Clever M.D.   On: 09/25/2022 20:23   DG Shoulder Right  Result Date: 09/25/2022 CLINICAL DATA:  Right-sided shoulder pain EXAM: RIGHT SHOULDER - 2+ VIEW COMPARISON:  None Available. FINDINGS: Degenerative changes of the acromioclavicular joint are seen. Some calcifications along the supraspinatus tendon are noted. No acute fracture or dislocation is seen. No soft tissue abnormality is noted. Underlying bony thorax appears within normal limits. IMPRESSION: Degenerative change without acute abnormality. Electronically Signed   By: Alcide Clever M.D.   On: 09/25/2022 20:22    Procedures Procedures: not indicated.   Medications Ordered in ED Medications - No data to display  ED Course/ Medical Decision Making/ A&P                                 Medical Decision Making Amount and/or Complexity of Data Reviewed Labs: ordered. Radiology: ordered.   This patient presents to the ED for concern of AMS, this involves an extensive number of treatment options, and is a complaint that carries with it a high risk of complications and morbidity.   Differential diagnosis includes: stroke, dementia, electrolyte abnormality, hypoglycemia, UTI, sepsis, etc.   Co morbidities that complicate the patient evaluation  See HPI above   Additional History  Additional history obtained from patient's records. External records from outside source obtained and reviewed including ALF's records.  Cardiac  Monitoring / EKG  The patient was maintained on a cardiac monitor.  I personally viewed and interpreted the cardiac monitored which showed: atrial fibrillation with multiple PVCs with a heart rate of 78 bpm.   Lab Tests  I ordered and personally interpreted labs.  The pertinent results include:   CBG 110 on arrival. CMP is within normal limits - no AKI or electrolyte abnormality. Hgb of 8.4, otherwise CBC is within normal limits - no acute infection. UA is unremarkable. Fecal occult negative.   Imaging Studies  I ordered imaging studies including CT brain without contrast.  RT said we would have to get clearance to do MRI. I independently visualized and interpreted imaging which showed: imaging pending at shift change. I agree with the radiologist interpretation.   Problem List / ED Course / Critical Interventions / Medication Management  Intermittent AMS since this morning I have reviewed the patients home medicines and have made adjustments as needed.   Social Determinants of Health  Housing   Test / Admission - Considered  Labs are all within normal limits. CT imaging pending at shift change. Care transferred to Dr. Wallace Cullens at shift change.       Final Clinical Impression(s) / ED Diagnoses Final diagnoses:  Altered mental status, unspecified altered mental status type    Rx / DC Orders ED Discharge Orders     None         Maxwell Marion, PA-C 09/25/22 2307    Sloan Leiter, DO 09/26/22 2252

## 2022-09-26 VITALS — BP 92/67 | HR 60 | Temp 97.50000°F

## 2022-09-26 VITALS — BP 90/51 | HR 57 | Temp 97.70000°F

## 2022-09-26 VITALS — BP 86/59 | HR 54 | Temp 97.30000°F

## 2022-09-26 VITALS — BP 94/62 | HR 51 | Temp 97.90000°F

## 2022-09-26 VITALS — BP 95/62 | HR 54 | Temp 98.20000°F

## 2022-09-26 DIAGNOSIS — R404 Transient alteration of awareness: Secondary | ICD-10-CM | POA: Diagnosis not present

## 2022-09-26 DIAGNOSIS — Z7401 Bed confinement status: Secondary | ICD-10-CM | POA: Diagnosis not present

## 2022-09-26 MED ORDER — POTASSIUM CHLORIDE 20 MEQ PO TBTQ
40-60 meq | ORAL | 0 refills | Status: DC | PRN
Start: 2022-09-26 — End: 2022-09-28

## 2022-09-26 MED ORDER — TUBE FEED (ENAR)
0 refills | Status: DC
Start: 2022-09-26 — End: 2022-09-28

## 2022-09-26 MED ORDER — POTASSIUM CHLORIDE 20 MEQ/15 ML PO LIQD
40-60 meq | NASOGASTRIC | 0 refills | Status: DC | PRN
Start: 2022-09-26 — End: 2022-09-28
  Administered 2022-09-27: 14:00:00 40 meq via NASOGASTRIC

## 2022-09-26 MED ORDER — MAGNESIUM SULFATE IN D5W 1 GRAM/100 ML IV PGBK
1 g | INTRAVENOUS | 0 refills | Status: DC | PRN
Start: 2022-09-26 — End: 2022-09-28

## 2022-09-26 MED ORDER — POTASSIUM CHLORIDE IN WATER 10 MEQ/50 ML IV PGBK
10 meq | INTRAVENOUS | 0 refills | Status: DC | PRN
Start: 2022-09-26 — End: 2022-09-28

## 2022-09-26 MED ORDER — MAGNESIUM OXIDE 400 MG (241.3 MG MAGNESIUM) PO TAB
400 mg | ORAL | 0 refills | Status: DC | PRN
Start: 2022-09-26 — End: 2022-09-28

## 2022-09-26 NOTE — Progress Notes
Patient disconnected from VEEG without complications.

## 2022-09-26 NOTE — Progress Notes
Neurosurgery Progress Note      Admission Date: 09/24/2022 LOS: 2 days  ______________________________________________________________    S: Resting in bed. Denies any needs.       O:                  Vital Signs: 24 Hour Range   BP: (86-103)/(59-68)   Temp:  [36.3 ?C (97.3 ?F)-36.6 ?C (97.9 ?F)]   Pulse:  [51-61]   Respirations:  [16 PER MINUTE]   SpO2:  [95 %-100 %]   O2 Device: None (Room air)     Physical Exam:  Awake and alert  Oriented to person and place; not year   Dysarthric speech   MAE, follows commands   EEG leads in place, will have removed       A/P: 89 y.o. male   Principal Problem:    SDH (subdural hematoma) (HCC)    Continue current care   Neurology Stroke following   >vEEG overnight, DC today   >continue Keppra 1 gm BID  >MRI/MRA head after EEG complete  Labs reviewed:   >Na 138  PT/OT/SLP   Pain control PRN  Plan formulating -- CM/SW  involved, SNF       Prophylaxis:   A) GI: None  B) Lines:  No  C) Urinary Catheter:  No  D) Antibiotic Usage:  No  E) VTE:  Mechanical prophylaxis; Sequential compression device  F) Restraints: Patient assessed for need for restraints.     Please page [phone] with any questions.    Varney Daily, MD  Voalte me

## 2022-09-26 NOTE — Progress Notes
PHYSICAL THERAPY  ASSESSMENT      Name: Brendan Acosta   MRN: [id number]     DOB: 11-11-1931      Age: 89 y.o.  Admission Date: 09/24/2022     LOS: 2 days     Date of Service: 09/26/2022      Mobility  Patient Turn/Position: Weight shifted (Chair)  Progressive Mobility Level: Walk in room  Distance Walked (feet): 40 ft  Level of Assistance: Assist X1  Assistive Device: Walker  Activity Limited By: Antonietta Breach Status Variability    Subjective  Significant Hospital Events: 89 year old man with history of LLE DVT on Xarelto who had a recent fall and L SDH, who is admitted for L MMA embolization. After procedure he was initially at baseline but then around 1530 had speech difficulty/garbled speech.  Mental / Cognitive: Alert;Distractible;Follows commands  Pain: No complaint of pain  Pain Interventions: Patient agrees to participate in therapy with current pain level;Patient assisted into position of comfort    Home Living Situation  Lives With: Spouse/significant other (pt reports he also lives with three grandchildren)  Type of Home: House  In-Home Stairs: 1 flight;Stair glide  Comments: Due to aphasia PLOF and home living limited.    Prior Level of Function  Comments: per chart review pt was at [facility] in June however pt indicates he was home prior to [facility] embolization    ROM  R UE ROM: WFL   R UE ROM Method: Active  L UE ROM: WFL   L UE ROM Method: Active    Strength  R UE Strength: WFL  L UE Strength: WFL    Bed Mobility/Transfer  Comments: Pt in bedside chair at beginning and end of session  Transfer Type: Sit to/from Stand  Transfer: Assistance Level: Minimal Assist;Moderate Assist;To/From;Bed;Toilet  Transfer: Assistive Device: Nurse, adult  Transfers: Type Of Assistance: Materials engineer;For Balance;For Strength Deficit;For Safety Considerations;Requires Extra Time  End Of Activity Status: Up in Chair;Nursing Notified;Instructed Patient to Request Assist with Mobility;Instructed Patient to Use Call Light  Comments: minA from chair, modA to/from toilet for balance, loss of balance to R on standing. Verbal cues for hand placement with transfers.    Gait  Gait Distance: 40 feet (total with one seated break at toilet)  Gait: Assistance Level: Minimal Assist  Gait: Assistive Device: Roller Walker  Gait: Descriptors: Pace: Slow;Forward trunk flexion;Swing-Through Gait;Decreased step length  Activity Limited By: Weakness;Complaint of Fatigue    Education  Persons Educated: Patient  Patient Barriers To Learning: Cognitive Deficits;Confusion  Interventions: Repetition of Instructions  Teaching Methods: Verbal Instruction  Patient Response: Verbalized Understanding  Topics: Plan/Goals of PT Interventions;Use of Assistive Device/Orthosis;Mobility Progression;Safety Awareness;Up with Assist Only;Importance of Increasing Activity;Ambulate With Nursing    Assessment/Progress  Impaired Mobility Due To: Decreased Strength;Decreased Activity Tolerance;Deconditioning;Cognitive Deficits;Safety Concerns;Impaired Balance  Impaired Strength Due To: Pain;Decreased Activity Tolerance;Deconditioning;Cognitive Deficits  Assessment/Progress: Should Improve w/ Continued PT    AM-PAC 6 Clicks Basic Mobility Inpatient  Turning from your back to your side while in a flat bed without using bed rails: A Little  Moving from lying on your back to sitting on the side of a flat bed without using bedrails : A Little  Moving to and from a bed to a chair (including a wheelchair): A Lot  Standing up from a chair using your arms (e.g. wheelchair, or bedside chair): A Lot  To walk in hospital room: A Lot  Climbing 3-5 steps with a railing: Total  Basic Mobility Inpatient Raw Score: 13  Standardized (T-scale) Score: 33.99    Goals  Goal Formulation: With Patient  Patient Will Go Supine To/From Sit: w/ Stand By Assist  Patient Will Transfer Bed/Chair: w/ Stand By Assist  Patient Will Transfer Sit to Stand: w/ Stand By Assist  Patient Will Ambulate: 101-150 Feet, w/ Dan Humphreys, w/ Stand By Assist  Patient Will Stand: w/ Stand By Assist    Plan  Treatment Interventions: Mobility training;Strengthening;ROM;Balance activities;Coordination training;Endurance training  Plan Frequency: 1-2 Days per Week  PT Plan for Next Visit: Progress gait with walker, repeat transfers, standing balance, strengthening    PT Discharge Recommendations  Recommendation: Inpatient setting    Therapist  Berenice Bouton, PT, DPT  Date  09/26/2022

## 2022-09-26 NOTE — Procedures
VIDEO EEG REPORT    SYED ZALAR  [birth year]  [id number]  Date of service: 8/9- 09/26/22    History: This is a 89 y.o. male presenting with chronic LSDH with dysarthra and right facial weakness    Current medications: gabapentin, levetiracetam    Introduction:  This study was performed using digital electroencephalographic recording equipment. International 10-20 electrode placement was used along with FT9 and FT10 electrodes. The record was obtained with the patient in the awake and asleep states.     Description:   There is an alpha rhythm of 7-8 Hz on the right. On the left, there is nearly continuous 2-3 Hz delta activity.     Stage II of sleep results in sleep spindles on the right.     Events:   Did not occur.     Interpretation:  This 25 hour video EEG study is abnormal due to: 1) continuous left hemisphere slowing; and 2) mild generalized slowing. No seizures.     Clinical correlation;   This study indicates a focal disturbance of the left hemisphere suggestive of underlying structural pathology. There is also a mild encephalopathy. No seizures.     Will d/c EEG.     Dorthula Rue, MD

## 2022-09-26 NOTE — Care Plan
Problem: Discharge Planning  Goal: Participation in plan of care  Outcome: Goal Ongoing  Goal: Knowledge regarding plan of care  Outcome: Goal Ongoing  Goal: Prepared for discharge  Outcome: Goal Ongoing     Problem: Skin Integrity  Goal: Skin integrity intact  Outcome: Goal Ongoing  Goal: Healing of skin (Wound & Incision)  Outcome: Goal Ongoing  Goal: Healing of skin (Pressure Injury)  Outcome: Goal Ongoing     Problem: Injury-Risk of, Non-Violent Physical Restraints  Goal: Absence of Injury while physically restrained (Non-Violent)  Outcome: Goal Ongoing

## 2022-09-26 NOTE — ED Notes (Signed)
Pt requesting to speak with this RN.  This RN entered room to check on pt.  Pt pleasant and asking this RN how he is doing and overall just wanting to converse.  This RN conversed with pt for a few minutes.  Pt provided with another blanket and reminded he is awaiting transport to go back home.

## 2022-09-27 VITALS — BP 91/50

## 2022-09-27 VITALS — BP 100/59 | HR 73 | Temp 98.00000°F

## 2022-09-27 VITALS — BP 97/53 | HR 62 | Temp 97.50000°F

## 2022-09-27 VITALS — BP 105/67 | HR 62 | Temp 98.30000°F

## 2022-09-27 VITALS — BP 97/57 | HR 63 | Temp 98.30000°F

## 2022-09-27 VITALS — BP 92/54 | HR 62 | Temp 97.50000°F

## 2022-09-27 VITALS — BP 84/51 | HR 62 | Temp 97.60000°F

## 2022-09-27 LAB — BASIC METABOLIC PANEL
ANION GAP: 7 (ref 3–12)
BLD UREA NITROGEN: 25 mg/dL (ref 7–25)
CALCIUM: 9.2 mg/dL (ref 8.5–10.6)
CHLORIDE: 104 MMOL/L (ref 98–110)
CO2: 26 MMOL/L (ref 21–30)
CREATININE: 0.8 mg/dL (ref 0.4–1.24)
EGFR: >60 mL/min (ref >60)
GLUCOSE,PANEL: 111 mg/dL — ABNORMAL HIGH (ref 70–100)
POTASSIUM: 3.9 MMOL/L (ref 3.5–5.1)
SODIUM: 137 MMOL/L (ref 137–147)

## 2022-09-27 NOTE — Progress Notes
I have reviewed the notes, assessments, and/or procedures performed by Lajoyce Lauber, RN, and concur with her documentation unless otherwise noted.

## 2022-09-27 NOTE — Progress Notes
SPEECH-LANGUAGE PATHOLOGY  DAILY TREATMENT NOTE      Patient seen 1x this date.  Documentation reflects all daily treatment sessions.    SUMMARY OF THERAPY SESSION:   Treatment Summary:   Dysphagia therapy completed.     Moderate oropharyngeal dysphagia  Sources of Dysphagia: Baseline dysphagia, COPD, Overall medical course ( h/o chronic subdural hematoma, DVT on AC, s/p IVC filter, CAD, hypothyroidism, RLS, BPH, HLD admitted for L MMA embolization for chronic subdural hematoma )   Persons Educated: Patient    Swallow Recommendations  PO: Ice chips only  NPO: Continue short term non-oral nutrition  Medications:  (Non oral means)  Ice Chip Trials: 10-15 per hour  Supervision: 1:1  Positioning: Upright 90 degrees or chair mode  Oral Hygiene: 3 times per day, Complete oral care to minimize the risk of aspirating oral bacteria     Education  Education was provided to: Patient  Education provided: Plans for VFSS next date, potential outcomes of VFSS, GOC (pt reported that long-term feeding tube is not within GOC, but would be open to the idea of thickening liquids. Pt reported, I had pneumonia a little bit ago for a week and it was awful. I do not want that again.)    Goal : Pt will tolerate ice chip protocol free from negative pulmonary status changes.  Met  Comment: Pt tolerating ice chips well at bedside this date.   Continue to address this goal    Goal : Pt will participate in ongoing swallow evaluation given min cues.  Partly met  Comment: Pt reported having a sore throat from Corpak this date. Pt assessed with trials below. Pt's presentation at bedside similar to presentation on 09/25/22. Will plan to complete instrumental evaluation next date for further recommendations.     PO Presentations  Presentations: Therapist Fed, Patient Fed Self  Thin Liquid: 1 Tsp, Straw, Consecutive Swallows  Mildly Thick Liquid: 1 Straw, Consecutive Swallows  Other Consistencies: Ice chips    Oral Stage  Withdraw Bolus: WFL  Form Bolus: Slowed, Incoordinated  Masticate Bolus: Did not trial masticated solids due to safety concerns (Comment)  Transfer Bolus: Suspect early spillover  Anterior Bolus Spillage: None  Oral Residue: None     Pharyngeal Stage  Swallow Initiation: Delayed  Laryngeal Elevation: Suspected to be reduced  Signs / Symptoms Of Aspiration: Thin liquid, Mildly thick liquid  Thin Liquid: Throat clear, Cough, Wet vocal quality  Mildly Thick Liquid: Throat clear, wet vocal quality  Suspected Pharyngeal Stage Impairment: Decreased laryngeal vestibule closure, Incomplete pharyngeal clearance, Mistimed airway protection, Reduced vocal fold closure  Pharyngeal Stage Summary: Pt demonstrated a throat clear following majority of thin and mildly thick liquid trials. Pt demonstrated x1 delayed response following x1 thin liquid trial. Intermittent wet vocal quality present with both consistencies.  Continue to address this goal    Goal : Pt will participate in a speech/language vs cognitive evaluation, as indicated, given min cues.  Not addressed  Comment: Session this date focused on dysphagia management.   Continue to address this goal    Plan: Videoswallow evaluation  Frequency: 3-5x/week     Therapist: Darci Needle, MS, CCC-SLP   Date: 09/27/2022

## 2022-09-27 NOTE — Progress Notes
Neurosurgery Progress Note      Admission Date: 09/24/2022 LOS: 3 days  ______________________________________________________________    S: Resting in bed. EEG discontinued. Denies needs. Wants to go home       O:                  Vital Signs: 24 Hour Range   BP: (90-100)/(51-67)   Temp:  [36.4 ?C (97.5 ?F)-36.8 ?C (98.2 ?F)]   Pulse:  [54-73]   Respirations:  [16 PER MINUTE-18 PER MINUTE]   SpO2:  [93 %-100 %]   O2 Device: None (Room air)     Physical Exam:  Awake and alert  Oriented to person and place; not year   Dysarthric speech   MAE, follows commands     Labs   Na 137     Meds   Keppra   Xarelto  Requip    Imaging   CTA head:     1.  Small caliber left M2 and M3 branches with more distal tapering and   oligemia of left MCA vasculature compared to the right.   2.  Small caliber distal right vertebral artery, likely developmental.   3.  Left subdural hematoma status post middle meningeal artery   embolization. Please see the CT report dictated just prior to this study.     CTA neck:     1. Patent bilateral carotid arterial vasculature without significant   stenosis by NASCET criteria.   2. Patent vertebral arteries. The left is dominant.     A/P: 89 y.o. male   Principal Problem:    SDH (subdural hematoma) (HCC)    Continue current care   Neurology Stroke following   >vEEG discontinued  >continue Keppra 1 gm BID  >MRI/MRA head after EEG complete  Labs reviewed, Na 137   PT/OT/SLP   Pain control PRN  Plan formulating -- CM/SW  involved, SNF       Prophylaxis:   A) GI: None  B) Lines:  No  C) Urinary Catheter:  No  D) Antibiotic Usage:  No  E) VTE:  Mechanical prophylaxis; Sequential compression device  F) Restraints: Patient assessed for need for restraints.     Please page [phone] with any questions.    Newman Nickels, MD  Voalte me

## 2022-09-27 NOTE — Progress Notes
09/27/22 1144 09/27/22 1151   Vitals   Temp 36.4 ?C (97.6 ?F)  --    Temperature Source Oral  --    Pulse 62  --    Respirations 16 PER MINUTE  --    SpO2 98 %  --    O2 Device None (Room air)  --    BP (!) 84/51  (RN notified) 91/50   Mean NBP (Calculated) (!) 62 MM HG (!) 64 MM HG   BP Source Arm, Left Upper Arm, Right Upper   BP Patient Position Head of bed (Comment degree) Head of bed (Comment degree)     Notified Varney Daily MD about low blood pressure. Patient denies lightheadedness, dizziness, shortness of air. No new orders at this time. Will check blood pressure as needed.

## 2022-09-27 NOTE — Care Plan
Problem: Discharge Planning  Goal: Participation in plan of care  Outcome: Goal Ongoing     Problem: Discharge Planning  Goal: Knowledge regarding plan of care  Outcome: Goal Ongoing     Problem: Skin Integrity  Goal: Skin integrity intact  Outcome: Goal Ongoing     Problem: Skin Integrity  Goal: Healing of skin (Pressure Injury)  Outcome: Goal Ongoing     Problem: Injury-Risk of, Non-Violent Physical Restraints  Goal: Absence of Injury while physically restrained (Non-Violent)  Outcome: Goal Ongoing     Problem: Moderate Fall Risk  Goal: Moderate Fall Risk  Outcome: Goal Ongoing

## 2022-09-28 ENCOUNTER — Encounter: Admit: 2022-09-28 | Discharge: 2022-09-28 | Payer: MEDICARE

## 2022-09-28 ENCOUNTER — Inpatient Hospital Stay: Admit: 2022-09-28 | Discharge: 2022-09-28 | Payer: MEDICARE

## 2022-09-28 VITALS — BP 94/60 | HR 89 | Temp 98.30000°F

## 2022-09-28 VITALS — BP 97/58 | HR 81 | Temp 98.00000°F

## 2022-09-28 VITALS — BP 107/58 | HR 60 | Temp 97.90000°F

## 2022-09-28 VITALS — BP 94/54 | HR 67 | Temp 98.50000°F

## 2022-09-28 VITALS — BP 95/59 | HR 67

## 2022-09-28 LAB — BASIC METABOLIC PANEL
ANION GAP: 7 10*3/uL — ABNORMAL LOW (ref 3–12)
BLD UREA NITROGEN: 21 mg/dL — ABNORMAL LOW (ref 7–25)
CALCIUM: 8.6 mg/dL — ABNORMAL LOW (ref >60)
CHLORIDE: 104 MMOL/L — ABNORMAL HIGH (ref 98–110)
CO2: 26 MMOL/L — ABNORMAL LOW (ref 21–30)
CREATININE: 0.7 mg/dL — ABNORMAL LOW (ref 0.4–1.24)
EGFR: >60 mL/min — ABNORMAL HIGH (ref >60)
GLUCOSE,PANEL: 109 mg/dL — ABNORMAL HIGH (ref 70–100)
POTASSIUM: 4.3 MMOL/L — ABNORMAL HIGH (ref 3.5–5.1)
SODIUM: 137 MMOL/L — ABNORMAL HIGH (ref 137–147)

## 2022-09-28 MED ORDER — LEVETIRACETAM 500 MG PO TAB
1000 mg | Freq: Two times a day (BID) | ORAL | 0 refills | Status: DC
Start: 2022-09-28 — End: 2022-09-29
  Administered 2022-09-28 – 2022-09-29 (×3): 1000 mg via ORAL

## 2022-09-28 MED ORDER — BARIUM SULFATE 40 % (W/V) PO SUSP
30 mL | Freq: Once | ORAL | 0 refills | Status: CP
Start: 2022-09-28 — End: ?
  Administered 2022-09-28: 14:00:00 30 mL via ORAL

## 2022-09-28 MED ORDER — BARIUM SULFATE 81 % (W/W) PO POWD
10 mL | Freq: Once | ORAL | 0 refills | Status: CP
Start: 2022-09-28 — End: ?
  Administered 2022-09-28: 14:00:00 60 mL via ORAL

## 2022-09-28 MED ORDER — RIVAROXABAN 10 MG PO TAB
10 mg | Freq: Every day | ORAL | 0 refills | Status: DC
Start: 2022-09-28 — End: 2022-09-29
  Administered 2022-09-28 – 2022-09-29 (×2): 10 mg via ORAL

## 2022-09-28 MED ORDER — LEVOTHYROXINE 50 MCG PO TAB
100 ug | Freq: Every day | ORAL | 0 refills | Status: DC
Start: 2022-09-28 — End: 2022-09-29
  Administered 2022-09-29: 11:00:00 100 ug via ORAL

## 2022-09-28 MED ORDER — FINASTERIDE 5 MG PO TAB
5 mg | Freq: Every day | ORAL | 0 refills | Status: DC
Start: 2022-09-28 — End: 2022-09-29
  Administered 2022-09-28 – 2022-09-29 (×2): 5 mg via ORAL

## 2022-09-28 MED ORDER — BARIUM SULFATE 40 % (W/V), 30% (W/W) PO PSTE
10 mL | Freq: Once | ORAL | 0 refills | Status: CP
Start: 2022-09-28 — End: ?
  Administered 2022-09-28: 14:00:00 10 mL via ORAL

## 2022-09-28 MED ORDER — GADOBENATE DIMEGLUMINE 529 MG/ML (0.1MMOL/0.2ML) IV SOLN
15 mL | Freq: Once | INTRAVENOUS | 0 refills | Status: CP
Start: 2022-09-28 — End: ?
  Administered 2022-09-28: 15 mL via INTRAVENOUS

## 2022-09-28 MED ORDER — GABAPENTIN 300 MG PO CAP
300 mg | Freq: Every evening | ORAL | 0 refills | Status: DC
Start: 2022-09-28 — End: 2022-09-29
  Administered 2022-09-29: 02:00:00 300 mg via ORAL

## 2022-09-28 MED ORDER — ACETAMINOPHEN 500 MG PO TAB
1000 mg | ORAL | 0 refills | Status: DC | PRN
Start: 2022-09-28 — End: 2022-09-29

## 2022-09-28 MED ORDER — POLYETHYLENE GLYCOL 3350 17 GRAM PO PWPK
1 | Freq: Every day | ORAL | 0 refills | Status: DC | PRN
Start: 2022-09-28 — End: 2022-09-29
  Administered 2022-09-29: 02:00:00 17 g via ORAL

## 2022-09-28 MED ORDER — BARIUM SULFATE 40 %(W/V), 29% (W/W)(1500 CPS) PO SUSP
10 mL | Freq: Once | ORAL | 0 refills | Status: CP
Start: 2022-09-28 — End: ?
  Administered 2022-09-28: 14:00:00 10 mL via ORAL

## 2022-09-28 MED ORDER — MELATONIN 5 MG PO TAB
5 mg | Freq: Every evening | ORAL | 0 refills | Status: DC | PRN
Start: 2022-09-28 — End: 2022-09-29
  Administered 2022-09-29: 02:00:00 5 mg via ORAL

## 2022-09-28 MED ORDER — CALCIUM CARBONATE 200 MG CALCIUM (500 MG) PO CHEW
500 mg | ORAL | 0 refills | Status: DC | PRN
Start: 2022-09-28 — End: 2022-09-29

## 2022-09-28 NOTE — Case Management (ED)
Case Management Progress Note    NAME:Brendan Acosta                          MRN: [id number]              DOB:08-27-1931          AGE: 89 y.o.  ADMISSION DATE: 09/24/2022             DAYS ADMITTED: LOS: 4 days      Today's Date: 09/28/2022    PLAN: Anticipate dc to inpatient setting pending medical stability, facility acceptance and insurance auth.      Expected Discharge Date: 09/29/2022 12:00 PM  Is Patient Medically Stable: No, Please explain: getting corpak out today  Are there Barriers to Discharge? no    INTERVENTION/DISPOSITION:  Discharge Planning         SW tasked CMA to initiate auth through [facility] for SNF.  No identified facility yet.     Auth ID number [id number]    SW called [facility] to follow up on referral from Friday.    [name] they are still reviewing it and will get back to worker.  Let them know he could be ready as early as Tuesday.     Provider let SW know pt will get his corpak out today.  Should be ready within the next day or two for placement.      SW called [facility] and they were waiting on information about the corpak and diet plan. SW told them again the corpak was getting taken out and he was placed on a regular diet with thin liquids with understood risk of aspiration.  He can ask to modify the diet at any time.  SW faxed [facility] the speech language note discussing the diet restrictions.   They will review and let SW know.       Transportation              Will the Patient Use Family Transport?: No  Transportation Name, Phone and Availability #1: facility to provide transportation  Support                 Info or Referral                 Positive SDOH Domains and Potential Barriers                   Medication Needs                                  Financial                 Legal                 Other                 Discharge Disposition                                                   Selected Continued Care - Admitted Since 09/24/2022    No services have been selected for the patient. Lorayne Marek, LMSW  Social Work Case Management  Available on Voalte  Work Cell [phone]

## 2022-09-28 NOTE — Case Management (ED)
CMA Note:       Started a navi SNF auth with pending facility with an auth id#: [id number] per request of Lorayne Marek, [facility].    Tyrone Sage  Case Management Assistant  For additional assistance please contact SWCM  *

## 2022-09-28 NOTE — Progress Notes
I have reviewed the notes, assessment, and/or procedures performed by Marko Stai, RN and concur with her/his documentation unless otherwise noted.

## 2022-09-28 NOTE — Progress Notes
OCCUPATIONAL THERAPY  PROGRESS NOTE      Name: Brendan Acosta   MRN: [id number]     DOB: [birth year]      Age: 89 y.o.  Admission Date: 09/24/2022     LOS: 4 days     Date of Service: 09/28/2022      Mobility  Patient Turn/Position: Chair  Progressive Mobility Level: Walk in hallway  Distance Walked (feet): 200 ft  Level of Assistance: Assist X1  Assistive Device: Walker  Activity Limited By: Fatigue;Weakness    Subjective  Significant Hospital Events: 89 year old man with history of LLE DVT on Xarelto who had a recent fall and L SDH, who is admitted for L MMA embolization. After procedure he was initially at baseline but then around 1530 had speech difficulty/garbled speech.  Mental / Cognitive: Alert;Cooperative;Follows commands  Pain: No complaint of pain  Pain Interventions: Patient agrees to participate in therapy with current pain level;Patient assisted into position of comfort  Comments: Patient in bed at arrival & remains in chair at exit with alarm activated.    Home Living Situation  Lives With: Spouse/significant other (pt reports he also lives with three grandchildren)  Type of Home: House  In-Home Stairs: 1 flight;Stair glide    Prior Level of Function  Level Of Independence: Independent with ADL and community mobility without device  History of Falls in Past 3 Months: Yes    Vision  Corrective Lenses: Wears glasses all of the time    ADL's  Where Assessed: Edge of Bed;In Bathroom;Standing at [employer]: Stand By Assist  Grooming Deficits: No Assist Needed;Wash/Dry Hands;Wash/Dry Face  LE Dressing Assist: Moderate Assist  LE Dressing Deficits: Don/Doff L Sock;Steadying  Toileting Assist: Minimal Assist  Toileting Deficits: Steadying  Comment: Assist to don R sock 2/2 swelling/edema. Completes mobility in the hall and toileting. Requires cues and physical assist for transfers & standing balance. From the chair, patient dons new gown and new brief with assist for steadying in standing.    ADL Mobility  Bed Mobility: Supine to Sit: Standby assist  Transfer Type: Sit to/from stand  Transfer: Assistance Level: To/from;Toilet;Bedside chair;Bed;Minimal assist  Transfer: Assistive Device: Roller walker  Transfer: Type of Assistance: For balance;For safety considerations  End of Activity Status: Up in chair;Instructed patient to request assist with mobility;Instructed patient to use call light;Nursing notified  Transfer Comments: Cues for UE placement during transfers.  Sitting Balance: Dynamic sitting balance;Static sitting balance;Standby assist  Standing Balance: Static standing balance;Dynamic standing balance;Minimal assist  Gait Distance: 200 feet  Gait: Assistance Level: Minimal assist  Gait: Assistive Device: Roller walker  Gait Comments: Patient with anterior lean and decreased hip extension during ambulation.    Activity Tolerance  Endurance: 3/5 Tolerates 25-30 Minutes Exercise w/Multiple Rests    Cognition  Overall Cognitive Status: WFL to Adequately Complete Self Care Tasks Safely  Comprehension: Hard of Hearing  Expression: Expressive Aphasia;Increased Time for Expression  Social Interaction: Interacts in a Spontaneous,Cooperative Manner  Problem Solving: Cueing to Sequence Task  Orientation: Alert & Oriented x4  Attention: Awake/Alert    Assessment  Assessment: Decreased ADL Status;Decreased Endurance;Decreased High-Level ADLs  Prognosis: Good;w/Cont OT s/p Acute Discharge  Goal Formulation: Patient    AM-PAC 6 Clicks Daily Activity Inpatient  Putting on and taking off regular lower body clothes: A Little  Bathing (Including washing, rinsing, drying): A Little  Toileting, which includes using toilet, bedpan, or urinal: A Little  Putting on and taking off regular  upper body clothing: None  Taking care of personal grooming such as brushing teeth: None  Eating meals: None  Daily Activity Raw Score: 21  Standardized (T-scale) Score: 44.27    Plan  OT Frequency: 3-5x/week  OT Plan for Next Visit: endurance; standing grooming, toileting in the bathroom.    ADL Goals  Patient Will Perform All ADL's: w/ Stand By Assist  Patient Will Perform Toileting: w/ Stand By Assist    Functional Transfer Goals  Pt Will Perform All Functional Transfers: w/ Stand By Assist    OT Discharge Recommendations  Recommendation: Inpatient setting    Therapist: Rhodia Albright, OTR/L [id number]    Date: 09/28/2022

## 2022-09-29 ENCOUNTER — Encounter: Admit: 2022-09-29 | Discharge: 2022-09-29 | Payer: MEDICARE

## 2022-09-29 ENCOUNTER — Inpatient Hospital Stay: Admit: 2022-09-29 | Discharge: 2022-09-29 | Payer: MEDICARE

## 2022-09-29 ENCOUNTER — Inpatient Hospital Stay: Admit: 2022-09-24 | Discharge: 2022-09-29 | Disposition: A | Discharge status: Home Health | Payer: MEDICARE

## 2022-09-29 VITALS — BP 94/53 | HR 55 | Temp 97.40000°F

## 2022-09-29 VITALS — BP 91/60 | HR 55 | Temp 97.50000°F | Ht 70.0 in | Wt 168.4 lb

## 2022-09-29 VITALS — BP 98/56 | HR 58 | Temp 97.50000°F

## 2022-09-29 DIAGNOSIS — R1310 Dysphagia, unspecified: Secondary | ICD-10-CM | POA: Diagnosis not present

## 2022-09-29 DIAGNOSIS — I69354 Hemiplegia and hemiparesis following cerebral infarction affecting left non-dominant side: Secondary | ICD-10-CM | POA: Diagnosis not present

## 2022-09-29 DIAGNOSIS — I679 Cerebrovascular disease, unspecified: Secondary | ICD-10-CM | POA: Diagnosis not present

## 2022-09-29 DIAGNOSIS — Z8719 Personal history of other diseases of the digestive system: Secondary | ICD-10-CM | POA: Diagnosis not present

## 2022-09-29 DIAGNOSIS — Z823 Family history of stroke: Secondary | ICD-10-CM

## 2022-09-29 DIAGNOSIS — Z86718 Personal history of other venous thrombosis and embolism: Secondary | ICD-10-CM

## 2022-09-29 DIAGNOSIS — I2510 Atherosclerotic heart disease of native coronary artery without angina pectoris: Secondary | ICD-10-CM

## 2022-09-29 DIAGNOSIS — R569 Unspecified convulsions: Secondary | ICD-10-CM

## 2022-09-29 DIAGNOSIS — Z9181 History of falling: Secondary | ICD-10-CM

## 2022-09-29 DIAGNOSIS — R29810 Facial weakness: Secondary | ICD-10-CM

## 2022-09-29 DIAGNOSIS — Z95828 Presence of other vascular implants and grafts: Secondary | ICD-10-CM

## 2022-09-29 DIAGNOSIS — N400 Enlarged prostate without lower urinary tract symptoms: Secondary | ICD-10-CM

## 2022-09-29 DIAGNOSIS — Z79899 Other long term (current) drug therapy: Secondary | ICD-10-CM

## 2022-09-29 DIAGNOSIS — E039 Hypothyroidism, unspecified: Secondary | ICD-10-CM

## 2022-09-29 DIAGNOSIS — Z8249 Family history of ischemic heart disease and other diseases of the circulatory system: Secondary | ICD-10-CM

## 2022-09-29 DIAGNOSIS — R4701 Aphasia: Secondary | ICD-10-CM

## 2022-09-29 DIAGNOSIS — E785 Hyperlipidemia, unspecified: Secondary | ICD-10-CM

## 2022-09-29 DIAGNOSIS — I10 Essential (primary) hypertension: Secondary | ICD-10-CM

## 2022-09-29 DIAGNOSIS — Z79890 Hormone replacement therapy (postmenopausal): Secondary | ICD-10-CM

## 2022-09-29 DIAGNOSIS — Z7901 Long term (current) use of anticoagulants: Secondary | ICD-10-CM

## 2022-09-29 DIAGNOSIS — G2581 Restless legs syndrome: Secondary | ICD-10-CM

## 2022-09-29 DIAGNOSIS — S065XAA Traumatic subdural hemorrhage with loss of consciousness status unknown, initial encounter (HCC): Secondary | ICD-10-CM

## 2022-09-29 DIAGNOSIS — R1312 Dysphagia, oropharyngeal phase: Secondary | ICD-10-CM

## 2022-09-29 DIAGNOSIS — R471 Dysarthria and anarthria: Secondary | ICD-10-CM

## 2022-09-29 LAB — 2D + DOPPLER ECHO
AORTIC VALVE STROKE VOLUME INDEX: 31
ASCENDING AORTA: 3.8 cm
AV INDEX (NATIVE): 0.9
AV MEAN GRADIENT: 2.1 mmHg
AV PEAK GRADIENT: 4 mmHg
AV PEAK VELOCITY: 1 m/s
AV VALVE AREA: 2.6 cm2
BSA: 1.9 m2
DOP CALC AO VTI: 23 cm
DOP CALC LVOT AREA: 3.1 cm2
DOP CALC LVOT DIAMETER: 2 cm
DOP CALC LVOT PEAK VEL VTI: 19 cm
DOP CALC LVOT PEAK VEL: 0.9 m/s
DOP CALC LVOT STROKE VOLUME: 60 cm3
E WAVE DECELARTION TIME: 262 ms
E/A RATIO: 0.8
EJECTION FRACTION: 70 %
FRACTIONAL SHORTENING: 41 % (ref 28–44)
INTERVENTRICULAR SEPTUM: 1.1 cm (ref 0.6–1)
LATERAL E/E' RATIO: 7.3
LEFT ATRIUM INDEX: 15 mL/m2 (ref 16–34)
LEFT ATRIUM SIZE: 3.3 cm (ref 3–4)
LEFT ATRIUM VOLUME: 30 mL (ref 18–58)
LEFT INTERNAL DIMENSION IN SYSTOLE: 2.4 cm (ref 2.5–4)
LEFT VENTRICLE DIASTOLIC VOLUME INDEX: 80 mL/m2 (ref 34–74)
LEFT VENTRICLE DIASTOLIC VOLUME: 156 mL (ref 62–150)
LEFT VENTRICLE MASS INDEX: 78 g/m2 (ref 49–115)
LEFT VENTRICLE SYSTOLIC VOLUME INDEX: 30 mL/m2 (ref 11–31)
LEFT VENTRICLE SYSTOLIC VOLUME: 59 mL — ABNORMAL LOW (ref 21–61)
LEFT VENTRICULAR INTERNAL DIMENSION IN DIASTOLE: 4.1 cm (ref 4.2–5.8)
LEFT VENTRICULAR MASS: 151 g (ref 88–224)
MEDIAL E/E' RATIO: 10
MV PEAK A VEL: 0.9 m/s
MV PEAK E VEL PW: 0.7 m/s
POSTERIOR WALL: 1.1 cm (ref 0.6–1)
RA PRESSURE: 8
RELATIVE WALL THICKNESS: 0.5 (ref <=0.42)
RIGHT ATRIAL AREA: 18 cm2 (ref <18)
RIGHT HEART SYSTOLIC MMODE TAPSE: 2 cm (ref >1.7)
RIGHT HEART SYSTOLIC TDI S': 0.1 m/s
RIGHT VENTRICULAR BASAL DIAMETER: 4.5 cm (ref 2.5–4.1)
RIGHT VENTRICULAR MID DIAMETER: 3.3 cm (ref 1.9–3.5)
RV SYSTOLIC PRESSURE: 19
SIMPSONS BIPLANE EF: 62 %
SINUS: 4 cm (ref 2.8–4)
STJ: 3.5 cm (ref 2.3–3.5)
TDI LATERAL E': 0.1 m/s
TDI MEDIAL E': 0 m/s
TR PEAK VELOCITY: 2.2 m/s
TV REST PULMONARY ARTERY PRESSURE: 27 mmHg

## 2022-09-29 MED ORDER — LEVETIRACETAM 1,000 MG PO TAB
1000 mg | ORAL_TABLET | Freq: Two times a day (BID) | ORAL | 0 refills | 90.00000 days | Status: DC
Start: 2022-09-29 — End: 2022-10-20
  Filled 2022-09-29: qty 60, 30d supply, fill #1

## 2022-09-29 MED ORDER — PERFLUTREN LIPID MICROSPHERES 1.1 MG/ML IV SUSP
1-10 mL | Freq: Once | INTRAVENOUS | 0 refills | Status: CP | PRN
Start: 2022-09-29 — End: ?
  Administered 2022-09-29: 20:00:00 2 mL via INTRAVENOUS

## 2022-09-29 MED ORDER — ATORVASTATIN 80 MG PO TAB
80 mg | ORAL_TABLET | Freq: Every day | ORAL | 0 refills | Status: AC
Start: 2022-09-29 — End: ?
  Filled 2022-09-29: qty 30, 30d supply, fill #1

## 2022-09-29 MED ORDER — SODIUM CHLOR 0.9% BACTERIOSTAT 0.9 % IJ SOLN
20 mL | Freq: Once | INTRAMUSCULAR | 0 refills | Status: CP
Start: 2022-09-29 — End: ?
  Administered 2022-09-29: 20:00:00 20 mL via INTRAMUSCULAR

## 2022-09-29 MED ORDER — ATORVASTATIN 40 MG PO TAB
80 mg | Freq: Every day | ORAL | 0 refills | Status: DC
Start: 2022-09-29 — End: 2022-09-29
  Administered 2022-09-29: 20:00:00 80 mg via ORAL

## 2022-09-29 MED ORDER — SODIUM CHLORIDE 0.9 % IJ SOLN
10 mL | Freq: Once | INTRAVENOUS | 0 refills | Status: CP
Start: 2022-09-29 — End: ?
  Administered 2022-09-29: 20:00:00 10 mL via INTRAVENOUS

## 2022-09-29 NOTE — Telephone Encounter
Received call from patient's daughter asking if he should remain on Keppra 1000mg  BID, since they only have 3 pills or so left at home once he's discharged.     This RN talked with Dr. Vonita Moss, plan to continue 1000mg  BID. Discussed with daughter that they can send a prescription to be filled at [facility] prior to discharge to have a supply at home, while waiting for [facility] to ship the rest of Keppra. Encouraged her to reach out to inpatient team who will finalize prescriptions at discharge.     Will reach out if there's any further issues.

## 2022-09-29 NOTE — Discharge Instructions - Pharmacy
Discharge Summary      Name: Brendan Acosta  Medical Record Number: [id number]        Account Number:  [id number]  Date Of Birth:  Jun 29, 1931                         Age:  89 y.o.  Admit date:  09/24/2022                     Discharge date: 09/29/2022      Discharge Attending:  Dr. Consuela Mimes  Discharge Summary Completed By: Domenick Gong, APRN-NP    Service: Surgery-Neuro    Reason for hospitalization:  SDH (subdural hematoma) (HCC) [S06.5XAA]    Primary Discharge Diagnosis:   SDH (subdural hematoma) [facility])    Hospital Diagnoses:  Hospital Problems        Active Problems    * (Principal) SDH (subdural hematoma) (HCC)     Present on Admission:   SDH (subdural hematoma) (HCC)        Significant Past Medical History        CAD (coronary artery disease)  Chronic subdural hematoma (HCC)      Comment:  left  Deep vein thrombosis (DVT) (HCC)      Comment:  RLE  Dilation of thoracic aorta (HCC)      Comment:  mild  Thyroid disease    Allergies   Niacin, Benzalkonium, Isosorbide mononitrate, and Ranolazine    Brief Hospital Course   The patient was admitted and the following issues were addressed during this hospitalization:   Patient underwent cerebral angiogram with left middle meningeal artery embolization on 09/24/22 with Dr. Vonita Moss. Patient tolerating procedure well and woke up intact. Noted to have sudden onset delayed speech with incomprehensible speech, stroke activated with symptoms and taken to CT. Ongoing work up- felt symptoms potentially related to seizure from recent SDH. Placed on video EEG and continued Keppra 1 gram BID. Patient monitored with improvement in speech, returning close to baseline. Ongoing work up, EEG with no active seizure during recording, CT/MRI brain, Echo, planning cardiac event monitor on discharge. Neuro stroke team following- recommending outpatient follow up in 3 months with repeat imaging- requested per neurology team. PT/OT/Speech following, cleared for regular diet with known aspiration risk; PT/OT with inpt vs home with [facility]; family/patient preference for home with [facility]. Medically stable for discharge. Discharged to home with family support and HH.      Items Needing Follow Up   Pending items or areas that need to be addressed at follow up: follow up MCOT at neurology follow up    Pending Labs and Follow Up Radiology    Pending labs and/or radiology review at this time of discharge are listed below: if this area is blank, there are no items for review.   Pending Labs       Order Current Status    VITAMIN B1 (THIAMINE) WHOLE BLD In process              Medications      Medication List      START taking these medications     atorvastatin 80 mg tablet; Commonly known as: LIPITOR; Dose: 80 mg; Take   one tablet by mouth daily. Follow up with PCP/Cardiology for further   refills/monitoring of cholesterol  Indications: excessive fat in the   blood; For: excessive fat in the blood; Quantity: 30 tablet; Refills: 0  CHANGE how you take these medications     levETIRAcetam 1,000 mg tablet; Commonly known as: KEPPRA; Dose: 1,000   mg; Take one tablet by mouth twice daily. Follow up with Dr. Vonita Moss for   refills. Continue as directed  Indications: seizure; For: seizure;   Quantity: 60 tablet; Refills: 0; What changed: additional instructions     CONTINUE taking these medications     acetaminophen 500 mg tablet; Commonly known as: TYLENOL EXTRA STRENGTH;   Dose: 500 mg; Refills: 0   acetaminophen-codeine 300-30 mg tablet; Commonly known as:   TYLENOL-CODEINE #3; Dose: 1 tablet; Refills: 0   dextran 70-hypromellose (PF) 0.1/0.3 % ophthalmic solution; Commonly   known as: ARTIFICIAL TEARS (PF); Dose: 2-3 drop; Refills: 0   diclofenac sodium 1 % topical gel; Commonly known as: VOLTAREN; Dose: 4   g; Refills: 0   finasteride 5 mg tablet; Commonly known as: PROSCAR; Dose: 5 mg;   Refills: 0   fluticasone propionate 50 mcg/actuation nasal spray, suspension;   Commonly known as: FLONASE; Dose: 2 spray; Refills: 0   gabapentin 300 mg capsule; Commonly known as: NEURONTIN; Dose: 300 mg;   Refills: 0   ketoconazole 2 % topical shampoo; Commonly known as: NIZORAL; Refills: 0   levothyroxine 100 mcg tablet; Commonly known as: SYNTHROID; Dose: 100   mcg; Refills: 0   other medication; Dose: 1 Dose; Refills: 0   polyethylene glycol 3350 17 g packet; Commonly known as: MIRALAX; Dose:   17 g; Refills: 0   rOPINIRole 3 mg tablet; Commonly known as: REQUIP; Dose: 3 mg; Refills:   0   tamsulosin 0.4 mg capsule; Commonly known as: FLOMAX; Dose: 0.4 mg; Take   one capsule by mouth at bedtime daily. Do not crush, chew or open   capsules. Take 30 minutes following the same meal each day.  Indications:   enlarged prostate with urination problem; For: enlarged prostate with   urination problem; Quantity: 90 capsule; Refills: 1   timolol maleate 0.25 % ophthalmic solution; Commonly known as: TIMOPTIC;   Dose: 1 drop; Refills: 0   turmeric (bulk) 100 % Powd; Refills: 0   XARELTO 10 mg tablet; Generic drug: rivaroxaban; Dose: 10 mg; Refills: 0   ZIOPTAN (PF) 0.0015 % ophthalmic drop; Generic drug: tafluprost (PF);   Dose: 1 drop; Refills: 0       Return Appointments and Scheduled Appointments     Scheduled appointments:      Oct 20, 2022 3:00 PM  Office visit with Samantha Crimes, MD  Neurosurgery: [facility] (NeuroSurgery) [address]  [address], [address] [place] [address]  [phone]     Nov 11, 2022 3:15 PM  (Arrive by 3:00 PM)  CT HEAD W/O CONTRAST with CT-MOB  Imaging, CT: [facility]) [address]  [address], [address]  [place] [address]  [phone]   Additional appointment instructions:    Neurology follow up requested; please call [phone] with questions regarding follow up.         Additional Discharge Appointments    Neurology follow up requested; please call [phone] with questions regarding follow up.         Consults, Procedures, Diagnostics, Micro, Pathology   Consults: Speech, Neuro Stroke  Surgical Procedures & Dates: None  Significant Diagnostic Studies, Micro and Procedures:   10/02/2022 diagnostic cerebral angiogram - left middle meningeal artery embolization   CT/CTA head, MRI head, Video swallow, Echo  Significant Pathology: none  Discharge Disposition, Condition   Patient Disposition: Home Health Care Svc [06]  Condition at Discharge: Stable    Code Status     Code Status History       Date Active Date Inactive Code Status Order ID          09/24/2022 [id number] 09/24/2022 1646 Full Code [id number]  Crista Elliot Inpatient    08/01/2022 2223 08/07/2022 1948 Full Code [id number]  Sherre Poot, DO Inpatient            Patient Instructions     Activity       Activity as Tolerated   As directed      It is important to keep increasing your activity level after you leave the hospital.  Moving around can help prevent blood clots, lung infection (pneumonia) and other problems.  Gradually increasing the number of times you are up moving around will help you return to your normal activity level more quickly.  Continue to increase the number of times you are up to the chair and walking daily to return to your normal activity level. Begin to work toward your normal activity level.  As tolerated; Avoid pulling, pushing or lifting greater than 10 pounds.    Driving Restrictions   As directed      No driving.    Other Activity Restrictions   As directed      Seizure precautions: no driving, no operating heavy or dangerous machinery including firearms, no swimming alone, no unsupervised tub baths, no standing at heights, or doing anything that would place the patient or others at risk if they were to have a seizure, until seizure free for at least six months.          Diet       Regular Diet   As directed      You have no dietary restriction. Please continue with a healthy balanced diet.             Discharge education provided to patient., Signs and Symptoms:   Report these signs and symptoms       Report These Signs and Symptoms   As directed      Please contact your doctor if you have any of the following symptoms: Call if temperature greater than 101, incision red, drainage or odor noted from incision, pain that is uncontrolled with pain medication, numbness or weakness, vision changes, trouble with speech or slurring of speech, or any questions/concerns.        , Education: , and Others Instructions:   Other Orders       Questions About Your Stay   Complete by: As directed      Discharging attending physician: Samantha Crimes    Order comments: For questions or concerns regarding your hospital stay call the clinic at [phone]. If outside normal business hours, call [phone] and ask for the neurosurgery resident on call to be paged.    Stroke Information   Complete by: As directed      Order comments: B.E. F.A.S.T. is an easy way to remember the sudden signs of stroke.    B.E. F.A.S.T. Stands for...  B  -  Balance  Is there a sudden difficulty with balance or coordination? Is walking or sitting difficult?  E  -  Eyes  Is there a sudden change in eyesight such as blurred vision, double vision, or a loss of vision in one or both eyes without pain?  F  -  Face   Does one side of the face droop or is it numb?  Ask the person to smile.  A  -  Arm   Is one arm weak or numb?  Ask the person to raise both arms.  Does one arm drift downward?  S  -  Speech   Is speech slurred or difficult to understand?  Are they unable to speak at all?  Ask the person to repeat is simple sentence like It is a bright and sunny day.    T  -  Time to call 911!  If a person shows any of these symptoms, even if they go away, call 911 to get them to the hospital immediately.  Time is very important.  The sooner they get to the hospital, the more likely they are to receive stroke reversing and potentially life-saving treatment.    Traits and lifestyle habits that increase your risk for stroke include prior stroke/TIA, age, gender, heredity/race, heart disease, high cholesterol, poor diet, and physical inactivity.    Continue your medications as ordered after discharge. These medications will help reduce your risk of stroke.    Abnormal lipids (fats in blood) Risk  Goal: Total Cholesterol: <200  LDL (bad cholesterol): <100  HDL (good cholesterol): >40 for men, >50 for women  Triglycerides: <150  Plan: Diets high in saturated fat, trans fat and cholesterol can raise blood cholesterol levels increasing your risk of stroke.  Take medication as prescribed and eat a heart healthy, low sodium diet.      It is important that you follow-up with your primary care physician 4 to 6 weeks after discharge. Be sure to keep all follow-up appointments. Please bring your discharge instructions with you to your follow-up appointments.    Should you have any questions once you have returned home, please feel free to contact the Stroke Program Coordinator at [phone].    Your last blood pressure was BP Readings from Last 1 Encounters:  09/29/22 : 91/60   .  Your target blood pressure is 120/80    If you have diabetes, even if treated, you are at an increased risk of stroke.  Many people with diabetes also have high blood pressure, high cholesterol or are overweight.  This increases your risk even more.      Smoking doubles your risk of stroke.  Quitting can greatly reduce your risk.  For more information on smoking cessation call [phone] or visit [email or web address].   Additional instructions:    Brendan Acosta    Left Middle Meningeal Artery Embolization on 09/24/2022 with Consuela Mimes, MD    Neurosurgery Discharge Instructions    Contact information:  Call Neurosurgery at any time if you have questions or are experiencing problems at [phone].  After 5 PM, weekends, holidays please call [phone] to reach Neurosurgery on call.  Post-operative wound care:  Your incision may be open to air.  Do not submerge (pool/tub) your incision at all for 6 weeks.  Showers only.   Incision should remain flat (no bubble), bruising is normal.  Please only touch your incision with clean, dry hands.  Avoid powders, lotions to site for 4 weeks.  Any bright red bleeding from groin site is a medical emergency.  Apply pressure and call 911.  Activity restrictions:  Avoid pushing, pulling, lifting, or bending more than 10 pounds (about a gallon of milk), for 1 week.  This includes excessive bending, stooping, or stair climbing for 2  days.  Do not drive if you are taking pain medications or your vision is abnormal.  Avoid bearing down or straining to have bowel movements.  Post-operative pain and medications:  Please use your pain medications as prescribed.  Pain medications can make you constipated. You may take a stool softener with miralax.   Tylenol and Ibuprofen are approved for pain control.  This is available over the counter.  Diet Instructions:  Ensure you are drinking 1-2 Liters of water daily for optimal hydration and blood flow.  Follow up appointment:   Scheduled appointments:      Oct 20, 2022 3:00 PM  Office visit with Samantha Crimes, MD  Neurosurgery: [facility] (NeuroSurgery) [address]  [address], [address] [place] [address]  [phone]     Nov 11, 2022 3:15 PM  (Arrive by 3:00 PM)  CT HEAD W/O CONTRAST with CT-MOB  Imaging, CT: Medical [facility]) [address]  [address], [address]  [place] [address]  [phone]     Nov 13, 2022 2:30 PM  Office visit with Evaristo Bury, MD  Neurosurgery: [facility] (NeuroSurgery) [address]  [address], [address] [place] [address]  [phone]        Please contact Neurosurgery if you develop any of the following:  Numbness, abnormal sensations of your face, arms, or legs, especially on one side. These are signs of stroke.  BE FAST: Balance (loss of balance), Eyes (blurred vision), Facial (droop), Arm (weakness), Speech (slurred)= Time Call 911, seek immediate medical attention at nearest ER facility.  Fever 101 or greater.  Redness, swelling, continuous oozing, fluid collection, warmth, or bad odor near the incision site.  The ?worst? headache of your life please seek immediate medical attention.  Sudden or worsening vision changes.             Additional Orders: Case Management, Supplies, Home Health     Home Health/DME       None              Signed:  Domenick Gong, APRN-NP  09/29/2022      cc:  Primary Care Physician:  Wannetta Sender   Verified    Referring physicians:  Samantha Crimes, MD   Additional provider(s):        Did we miss something? If additional records are needed, please fax a request on office letterhead to [phone]. Please include the patient's name, date of birth, fax number and type of information needed. Additional request can be made by email at [email or web address]. For general questions of information about electronic records sharing, call [phone].

## 2022-09-29 NOTE — Case Management (ED)
Case Management Progress Note    NAME:Brendan Acosta                          MRN: [id number]              DOB:[birth year]          AGE: 89 y.o.  ADMISSION DATE: 09/24/2022             DAYS ADMITTED: LOS: 5 days      Today's Date: 09/29/2022    PLAN: Patient to dc home this afternoon.     Expected Discharge Date: 09/30/2022 4:00 PM  Is Patient Medically Stable: Yes   Are there Barriers to Discharge? no    INTERVENTION/DISPOSITION:  Discharge Planning              Discharge Planning: Home Health  NCM reviewed EMR  NCM attended and participated in neurosurgery huddle.  NCM was notified by SW that patient now wants to go home and no longer wants to go to SNF. Patient states that he was current with [facility] prior to admission.   NCM called [facility] and they confirmed patient was current with them.   NCM sent them signed ROC orders and sent dc AVS.   NCM will continue to follow for any other dc needs that may arise.  Transportation              Will the Patient Use Family Transport?: No  Transportation Name, Phone and Availability #1: facility to provide transportation  Support              Support: Pt/Family Updates re:POC or DC Plan, Huddle/team update  Info or Referral              Information or Referral to [facility]: No Needs Identified  Positive SDOH Domains and Potential Barriers                   Medication Needs              Medication Needs: No Needs Identified                                                                                                                                          Financial              Financial: No Needs Identified  Legal              Legal: No Needs Identified  Other              Other/None: No needs identified  Discharge Disposition  Selected Continued Care - Admitted Since 09/24/2022       [facility] Coordination complete.      Service Provider Selected Services Address Phone Fax Patient Preferred    [facility] [address], [place] [address] [phone] --                      Cristine Polio, RN    Cristine Polio, RN, BSN  Nurse Case Manager  Available on [place]  Phone: [phone]

## 2022-09-29 NOTE — Progress Notes
PHYSICAL THERAPY  PROGRESS NOTE          Name: Brendan Acosta   MRN: [id number]     DOB: Jul 01, 1931      Age: 89 y.o.  Admission Date: 09/24/2022     LOS: 5 days     Date of Service: 09/29/2022        Mobility  Patient Turn/Position: Self  Progressive Mobility Level: Walk in hallway  Distance Walked (feet): 150 ft  Level of Assistance: Assist X1  Assistive Device: Walker    Subjective  Significant Hospital Events: PMH:  LLE DVT on Xarelto, recent fall with L SDH (07/2022).  Admitted for L MMA embolization.  RRT after procedure with R facial droop and concern for CVA vs. seizure (imaging clear) (8/8).  Pain: No complaint of pain  Pain Interventions: Patient assisted into position of comfort    Home Living Situation  Lives With: Spouse/significant other (Wife (minimal assistance available))  Comments: Reports daughter and grandchildren nearby and check in a few days/wk  Type of Home: House  Entry Stairs: 2 (No handrail)  In-Home Stairs: 1 flight;Rail on both sides;Stair glide (Flight or stair glide up to bedroom; flight down to basement for leisure (frequents often))  Patient Owned Equipment: Environmental consultant with wheels    Prior Level of Function  Level Of Independence: Independent with ADL and community mobility without device  History of Falls in Past 3 Months: Yes  Comments: per chart review pt was at [facility] in June however pt indicates he was home prior to [facility] embolization    Bed Mobility/Transfer  Bed Mobility: Supine to Sit: Standby Assist;Bed Flat  Bed Mobility: Sit to Supine: Standby Assist;Bed Flat  Transfer Type: Sit to/from Stand  Transfer: Assistance Level: To/From;Bed;Minimal Assist (CGA for safety)  Transfer: Assistive Device: Surveyor, quantity Distance: 150 feet  Gait: Assistance Level: Minimal Assist (CGA for safety but no LOBs detected)  Gait: Assistive Device: Nurse, adult  Gait: Descriptors: Pace: Slow;Forward trunk flexion;Swing-Through Gait;Decreased step length  Stairs: Number Climbed: 6  Stairs: Descriptors: Non-Reciprocal  Stairs: Assistance Level: Minimal Assist  Stairs: Assistive Device: Two Rails  Activity Limited By: Weakness;Complaint of Fatigue    Assessment/Progress  Impaired Mobility Due To: Decreased Strength;Decreased Activity Tolerance;Deconditioning;Cognitive Deficits;Safety Concerns;Impaired Balance  Impaired Strength Due To: Pain;Decreased Activity Tolerance;Deconditioning;Cognitive Deficits  Assessment/Progress: Should Improve w/ Continued PT    AM-PAC 6 Clicks Basic Mobility Inpatient  Turning from your back to your side while in a flat bed without using bed rails: A Little  Moving from lying on your back to sitting on the side of a flat bed without using bedrails : A Little  Moving to and from a bed to a chair (including a wheelchair): A Lot  Standing up from a chair using your arms (e.g. wheelchair, or bedside chair): A Lot  To walk in hospital room: A Lot  Climbing 3-5 steps with a railing: Total  Basic Mobility Inpatient Raw Score: 13  Standardized (T-scale) Score: 33.99    Goals  Goal Formulation: With Patient  Patient Will Go Supine To/From Sit: w/ Stand By Assist  Patient Will Transfer Bed/Chair: w/ Stand By Assist  Patient Will Transfer Sit to Stand: w/ Stand By Assist  Patient Will Ambulate: 101-150 Feet, w/ Dan Humphreys, w/ Stand By Assist  Patient Will Stand: w/ Stand By Assist    Plan  Treatment Interventions: Mobility training;Strengthening;ROM;Balance activities;Coordination training;Endurance training  Plan Frequency: 1-2 Days per Week  PT Plan for Next Visit: Work towards mod I (transfers/gait).  Ongoing stairs (no HR to enter 2.. clarify if platform vs. flight w/ 2 HR to bedroom)    PT Discharge Recommendations  Comments: While inpatient would be beneficial, patient expresses strong desire for home.  If he opts for home, will need assistance on entry stairs (no handrail) and all community mobility; agreeable to using stairglide up to bedroom until able to perform without assistance.  Recommend consistent supervision, ongoing use of RW (owns device), and HH therapies.    Therapist: Inocencio Homes, PT  Date: 09/29/2022

## 2022-09-29 NOTE — Progress Notes
Ambulatory (External) Cardiac Monitor Enrollment Record     Placement Location: Inpatient  Clinic Location: MPB5  Vendor: [facility] Cardiac Telemetry (MCOT/MCT)?: Yes  Duration of Monitor (in days): 30  Monitor Diagnosis: Other (S06.5XAA Traum subdr hem with LOC status unknown, initial encounter)  Secondary Monitor Diagnosis: Coronary Artery Disease (CAD) (i25.10)  Ordering Provider: Porfirio Oar, MD  AMB Monitor Serial Number: [id number] INPT  AMB Monitor Expiration Date: 09/15/22 [facility] DATE)      Start Time and Date: 09/29/22 1:11 PM   Patient Name: Brendan Acosta  DOB: [birth year]  MRN: [id number]  Sex: male  Mobile Phone Number: [phone] (mobile)  Home Phone Number: [phone]  Patient Address: [address] [place] [address]  Insurance Coverage: [employer] MEDICARE PPO  Insurance ID: [id number]  Insurance Group #: [id number]  Insurance Subscriber: [name],[name]  Implanted Cardiac Device Information: No results found for: [email or web address]      Patient instructed to contact company phone number on the monitor box with questions regarding billing, placement, troubleshooting.     Dorena Dew    ____________________________________________________________    Clinic Staff:    Complete additional steps for documentation double check/Co-Sign.  In Follow-up, send chart upon closing encounter to P [facility] HRM AMBULATORY MONITORS    HRM Ambulatory Monitoring Team:  Schedule on appropriate template and check-in.   Clinic Placement Schedule on clinic location [facility] schedule   Home Enrollment Schedule on Home Enrollment schedule ([facility] HRT RHYTHM)   Given to patient in clinic for self-placement Schedule on Home Enrollment schedule ([facility] HRT RHYTHM)   Inpatient Schedule on [employer] [facility] AMBULATORY MONITORING template   2. Please enroll with appropriate vendor.

## 2022-09-29 NOTE — Progress Notes
Brendan Acosta discharged on 09/29/2022.   Marland Kitchen  Discharge instructions reviewed with patient.  Valuables returned: na  ADL Belongings at Bedside: Eyeglasses/contacts.  Home medications: na  Heart monitor on and box with phone sent in personal belongings bag  Patient iv removed, discharged via wheelchair, and meds picked up in pharm.   .  Functional assessment at discharge complete: Yes .

## 2022-09-29 NOTE — Case Management (ED)
CMA Note:       Uploaded updated clinical to [facility] per request from [place], [facility].     Calla Kicks  Case Management Assistant  For additional assistance please contact SWCM

## 2022-09-29 NOTE — Progress Notes
Neurology Progress Note      Admission Date: 09/24/2022                                                LOS: 5 days      Assessment/Plan:   Brendan Acosta is a 89 y.o. male with a PMH significant for LLE DVT on Xarelto who had a recent fall and L SDH, who is admitted for L MMA embolization of chronic L SDH. After procedure 8/8 he was initially at baseline but then around 1530 had speech difficulty/garbled speech and was stroke activated. Currently on Keppra 1 g twice daily for concern of seizures. CT brain with chronic L SDH, indwelling coils from MMA embolization, no recent stroke. CTA head/neck shows some paucity in superior L MCA branches, no proximal occlusion or high grade stenosis. vEEG monitoring showed no seizures. MRI brain w/o contrast showed equivocal left precentral gyrus cortical punctate infarct.    Subacute ischemic stroke  - Etiology possibly secondary to edema associated with L SDH vs ESUS.    Recommendations:  > Echo (TTE): LVEF 55-60%. No intracardiac thrombus seen.  > Stroke risk factor modification:    HLD: LDL goal <70, (not at goal with LDL 212)   DM2: HgbA1c goal <7.0 (at goal with HgbA1c 5.9)   HTN: long-term SBP goal of 130/80   Tobaccoism: encourage abstinence if use is present   Continue telemetry while admitted. 30 day MCOT on discharge.  > Antithrombotic therapy: Continue anticoagulation for acute DVT. If this is stopped, would continue on aspirin 81 mg daily.  > Recommend repeat CTA head/neck in 3 months (ordered) to evaluate for patency of L M2 and M3 vasculature  > Outpatient neurology stroke follow up (ordered).    Patient seen and discussed with Dr. Nilda Simmer.      Erenest Rasher, MD  Neurology Resident, PGY4  Available on Voalte  ______________________________________________________________________    Subjective:  No acute events overnight. Patient seen at bedside. The patient reports his dysarthria has remained resolved apart from when his mouth is dry. No other focal weakness.      Scheduled Meds:atorvastatin (LIPITOR) tablet 80 mg, 80 mg, Oral, QDAY  finasteride (PROSCAR) tablet 5 mg, 5 mg, Oral, QDAY  gabapentin (NEURONTIN) capsule 300 mg, 300 mg, Oral, QHS  levETIRAcetam (KEPPRA) tablet 1,000 mg, 1,000 mg, Oral, BID  levothyroxine (SYNTHROID) tablet 100 mcg, 100 mcg, Oral, QDAY 30 min before breakfast  rivaroxaban (XARELTO) tablet 10 mg, 10 mg, Oral, QDAY w/breakfast  rOPINIRole (REQUIP) tablet 3 mg, 3 mg, Oral, BID  sodium chloride 0.9% bacteriostatic injection 20 mL, 20 mL, Injection, ONCE  sodium chloride PF 0.9% injection 10 mL, 10 mL, Intravenous, ONCE  timolol maleate (TIMOPTIC) 0.25 % ophthalmic solution 1 drop, 1 drop, Both Eyes, BID    Continuous Infusions:  PRN and Respiratory Meds:acetaminophen Q6H PRN, calcium carbonate Q4H PRN, melatonin QHS PRN, ondansetron Q6H PRN **OR** ondansetron (ZOFRAN) IV Q6H PRN, perflutren lipid microspheres Once PRN **AND** sodium chloride PF 0.9% ONCE, polyethylene glycol 3350 QDAY PRN, sennosides-docusate sodium QDAY PRN      Review of Systems:  A comprehensive 14 point review of systems was reviewed and negative except as in HPI.       Vital Signs:  Last Filed in 24 hours Vital Signs:  24 hour Range    BP: 91/60 (08/13  1247)  Temp: 36.4 ?C (97.5 ?F) (08/13 1247)  Pulse: 55 (08/13 1247)  Respirations: 16 PER MINUTE (08/13 1247)  SpO2: 98 % (08/13 1247)  O2 Device: None (Room air) (08/13 1247) BP: (91-98)/(53-60)   Temp:  [36.3 ?C (97.4 ?F)-36.9 ?C (98.5 ?F)]   Pulse:  [55-89]   Respirations:  [16 PER MINUTE-18 PER MINUTE]   SpO2:  [95 %-100 %]   O2 Device: None (Room air)     Physical Exam:  General physical exam:  General appearance: alert, no acute distress  HEENT: normocephalic, eyes open with no discharge, nares patent, oropharynx is clear with no lesions, palate intact  CV: regular rate and rhythm, no murmur, distal pulses palpable  Chest: normal configuration, lungs are clear bilaterally  Skin: no rashes or lesions    Neuro exam:   Mental status: Awake, alert, and oriented to person, place, time and location/situation    Speech: Mild dysarthria present due to dry mouth initially, resolved after drinking water.   Normal Abnormal   Fluency x    Comprehension x    Articulation x    Repetition x    Naming x        Cranial Nerves:    Normal Abnormal   II Pupils equal (2mm), round, reactive.    III, IV, VI EOMI Nystagmus w/ left gaze, sustained   V Intact to LT in V1-3    VII Face symmetrical, eye closure symmetrical    VIII Hearing intact to finger rub    IX, X Uvula midline and palate elevation symmetrical    XI Shrug equal     XII Tongue midline        Muscle/motor:   Tone: nml  Bulk: nml  Fasciculations: none  Pronator drift: absent  No abnormal movements, rigidity or spasticity     NF NE SA EF EE WF WE FF FE FAb TAb HF HE HAb KF KE DF PF TE TF   R   5 5 5   5     4+   5 5 5 5      L   5 5 5   5    5   5 5 5 5          Sensation:    Normal RUE LUE RLE LLE   Light Touch x       Pin Prick        Temperature x       Vibration        Proprioception        Sensory level: absent    Coordination:    Normal Abnormal Right Abnormal Left   Finger to Nose x     Rapid alternating       Heel to Shin x     Finger tap      Foot tap      Other        Gait and Station: deferred      Lab/Radiology/Other Diagnostic Tests:  24-hour labs:  No results found for this visit on 09/24/22 (from the past 24 hour(s)).  Pertinent radiology reviewed.  MRI HEAD WO/W CONTRAST   Final Result         1.  Grossly stable size of the left cerebral convexity subdural hematoma with thin subdural hemorrhage along the right cerebral convexity.   2.  Small late subacute hemorrhagic parenchymal contusion in the anterolateral left temporal lobe.   3.  Tiny equivocal focus of recent  infarct along the posterior left middle frontal gyrus.   4.  Associated intracranial mass effect with left cerebral sulcal compression. No midline shift or intracranial herniation.   5.  Mild generalized cerebral and cerebellar volume loss and mild nonspecific white matter FLAIR hyperintensities, most likely secondary to chronic microvascular ischemic changes.   6.  Small chronic right inferior cerebellar infarct.          Finalized by Lafonda Mosses, DO on 09/29/2022 8:12 AM. Dictated by Lafonda Mosses, DO on 09/29/2022 7:57 AM.         Tenna Child MOTION SERIES   Final Result   FINDINGS/IMPRESSION:      1.  Silent aspiration with thin consistency barium.   2.  Laryngeal penetration without aspiration of nectar and honey consistency barium.   3.  No laryngeal penetration or aspiration of pudding consistency barium.   4.  Indwelling nasoenteric tube.   5.  Please see separately dictated report from the Department of Speech Pathology for further description.      By my electronic signature, I attest that I have personally reviewed the images for this examination and formulated the interpretations and opinions expressed in this report          Finalized by Prince Rome, D.O. on 09/28/2022 10:21 AM. Dictated by Geraldo Docker, M.D. on 09/28/2022 9:27 AM.         FEEDING TUBE PLCMNT (ABD/CHEST LMTD)   Final Result         1.  Enteric tube in place with tip projected over the proximal stomach. The stylet remains in place. If postpyloric positioning is desired, recommend advancement/repositioning.   2.  Visualized bowel gas pattern is nonobstructive. Lower abdomen is excluded from the field-of-view.   3.  Moderate colonic stool burden in the imaged hepatic flexure.   4.  IVC filter overlies the mid abdomen.            By my electronic signature, I attest that I have personally reviewed the images for this examination and formulated the interpretations and opinions expressed in this report          Finalized by Prince Rome, D.O. on 09/25/2022 9:41 AM. Dictated by Geraldo Docker, M.D. on 09/25/2022 8:46 AM.         CTA HEAD WO/W CONT   Final Result         CTA head:      1.  Small caliber left M2 and M3 branches with more distal tapering and oligemia of left MCA vasculature compared to the right.   2.  Small caliber distal right vertebral artery, likely developmental.   3.  Left subdural hematoma status post middle meningeal artery embolization. Please see the CT report dictated just prior to this study.      CTA neck:      1. Patent bilateral carotid arterial vasculature without significant stenosis by NASCET criteria.   2. Patent vertebral arteries. The left is dominant.      Findings were reviewed with Dr. Vonita Moss in person at 4:15 PM by Dr. Hector Brunswick.      By my electronic signature, I attest that I have personally reviewed the images for this examination and formulated the interpretations and opinions expressed in this report          Finalized by Marily Memos, M.D. on 09/24/2022 4:46 PM. Dictated by Myrna Blazer, MD on 09/24/2022 4:03 PM.         CTA NECK WO/W CONT  Final Result         CTA head:      1.  Small caliber left M2 and M3 branches with more distal tapering and oligemia of left MCA vasculature compared to the right.   2.  Small caliber distal right vertebral artery, likely developmental.   3.  Left subdural hematoma status post middle meningeal artery embolization. Please see the CT report dictated just prior to this study.      CTA neck:      1. Patent bilateral carotid arterial vasculature without significant stenosis by NASCET criteria.   2. Patent vertebral arteries. The left is dominant.      Findings were reviewed with Dr. Vonita Moss in person at 4:15 PM by Dr. Hector Brunswick.      By my electronic signature, I attest that I have personally reviewed the images for this examination and formulated the interpretations and opinions expressed in this report          Finalized by Marily Memos, M.D. on 09/24/2022 4:46 PM. Dictated by Myrna Blazer, MD on 09/24/2022 4:03 PM.         CT HEAD WO CONTRAST   Final Result      1. Indwelling left middle meningeal artery coils due to the procedure performed shortly before this exam.   2. Chronic left subdural hematoma. Internal hyperdense areas may be due to contrast retention or recent areas of hemorrhage   3. Stable generalized cerebral and cerebellar volume loss. Stable chronic microvascular ischemic changes.   4. No definite areas of loss of the peripheral gray-white differentiation.      The findings were discussed with Dr. Smith Robert at 3:54 PM by Dr. Hector Brunswick.          Finalized by Marily Memos, M.D. on 09/24/2022 4:46 PM. Dictated by Marily Memos, M.D. on 09/24/2022 3:56 PM.         IR CEREBRAL ARTERIOGRAM DIAGNOSTIC WITH INTERVENTION   Final Result      2D + DOPPLER ECHO    (Results Pending)

## 2022-09-29 NOTE — Progress Notes
SPEECH-LANGUAGE PATHOLOGY  DAILY TREATMENT NOTE    Treatment Summary:   Dysphagia therapy completed. Pt s/p VFSS 8/12 indicating moderate-severe oropharyngeal dysphagia with high risk of aspiration; patient has opted to continue regular diet and thin liquids with known risk of aspiration.   Moderate-severe oropharyngeal dysphagia  Sources of Dysphagia: Baseline dysphagia, COPD  Persons Educated: Patient  Prognosis: Anticipate pt at/near baseline    Swallow Recommendations  PO: Regular solids, thin liquids with known risk of aspiration  Medications: As tolerated  Supervision: No supervision needed  Swallow Strategies: Small bites/drinks, slow rate of intake, alternate solids/liquids  Positioning: Upright 90 degrees or chair mode  Oral Hygiene: 3 times per day, Complete oral care to minimize the risk of aspirating oral bacteria    Education  Education was provided to: patient  Education provided: SLP reiterated high risk of aspiration with all PO intake, consistent with baseline dysphagia. Pt maintains his desire to continue PO intake with known risk of aspiration. Discussed swallow strategies and encouraged thorough oral care. Pt stated he has not completed oral care since admission; oral care supplies provided.     Goal : Pt will tolerate the least restrictive diet free from negative pulmonary status changes and with less than 5% s/s of aspiration.  Partly met  Comment: Pt remains stable on room air. Pt finishing breakfast upon arrival of SLP. Noted coughing episode x1 and occasional throat clears.   Discharge this goal as met    Goal : Pt will participate in ongoing conversations re: dysphagia management.  Met  Comment: Pt continues to express his desire to consume regular diet and thin liquids despite known risk of aspiration. Pt feels he is at his baseline and does not desire an alternate source of nutrition at this time.   Discharge this goal as met    Frequency: No further SLP intervention indicated; will sign-off    Therapist: Adolphus Birchwood, MA,CCC-SLP  Date: 09/29/2022

## 2022-09-29 NOTE — Case Management (ED)
CMA Note:       Wellsite geologist updates to [employer], [facility].      Forrestine Him   Case Management Assistant  For additional assistance please contact SWCM  *

## 2022-09-29 NOTE — Discharge Instructions - Appointments
Neurology follow up requested; please call [phone] with questions regarding follow up.

## 2022-09-30 ENCOUNTER — Encounter: Admit: 2022-09-30 | Discharge: 2022-09-30 | Payer: MEDICARE

## 2022-10-01 LAB — VITAMIN B1 (THIAMINE) WHOLE BLD: VITAMIN B1,THIAMINE: 142

## 2022-10-02 DIAGNOSIS — I69811 Memory deficit following other cerebrovascular disease: Secondary | ICD-10-CM | POA: Diagnosis not present

## 2022-10-02 DIAGNOSIS — F01C0 Vascular dementia, severe, without behavioral disturbance, psychotic disturbance, mood disturbance, and anxiety: Secondary | ICD-10-CM | POA: Diagnosis not present

## 2022-10-07 ENCOUNTER — Encounter: Admit: 2022-10-07 | Discharge: 2022-10-07 | Payer: MEDICARE

## 2022-10-07 DIAGNOSIS — Z9189 Other specified personal risk factors, not elsewhere classified: Secondary | ICD-10-CM | POA: Diagnosis not present

## 2022-10-07 DIAGNOSIS — Z515 Encounter for palliative care: Secondary | ICD-10-CM | POA: Diagnosis not present

## 2022-10-08 DIAGNOSIS — H6120 Impacted cerumen, unspecified ear: Secondary | ICD-10-CM | POA: Diagnosis not present

## 2022-10-08 DIAGNOSIS — E785 Hyperlipidemia, unspecified: Secondary | ICD-10-CM | POA: Diagnosis not present

## 2022-10-08 DIAGNOSIS — G8929 Other chronic pain: Secondary | ICD-10-CM | POA: Diagnosis not present

## 2022-10-08 DIAGNOSIS — I10 Essential (primary) hypertension: Secondary | ICD-10-CM | POA: Diagnosis not present

## 2022-10-08 DIAGNOSIS — I482 Chronic atrial fibrillation, unspecified: Secondary | ICD-10-CM | POA: Diagnosis not present

## 2022-10-08 DIAGNOSIS — H906 Mixed conductive and sensorineural hearing loss, bilateral: Secondary | ICD-10-CM | POA: Diagnosis not present

## 2022-10-08 DIAGNOSIS — H6521 Chronic serous otitis media, right ear: Secondary | ICD-10-CM | POA: Diagnosis not present

## 2022-10-12 DIAGNOSIS — H5789 Other specified disorders of eye and adnexa: Secondary | ICD-10-CM | POA: Diagnosis not present

## 2022-10-15 ENCOUNTER — Encounter: Admit: 2022-10-15 | Discharge: 2022-10-15 | Payer: MEDICARE

## 2022-10-20 ENCOUNTER — Encounter: Admit: 2022-10-20 | Discharge: 2022-10-20 | Payer: MEDICARE

## 2022-10-20 ENCOUNTER — Ambulatory Visit: Admit: 2022-10-20 | Discharge: 2022-10-20 | Payer: MEDICARE

## 2022-10-20 DIAGNOSIS — I2510 Atherosclerotic heart disease of native coronary artery without angina pectoris: Secondary | ICD-10-CM

## 2022-10-20 DIAGNOSIS — S065XAA SDH (subdural hematoma) (HCC): Secondary | ICD-10-CM

## 2022-10-20 DIAGNOSIS — I77810 Thoracic aortic ectasia: Secondary | ICD-10-CM

## 2022-10-20 DIAGNOSIS — I82409 Acute embolism and thrombosis of unspecified deep veins of unspecified lower extremity: Secondary | ICD-10-CM

## 2022-10-20 DIAGNOSIS — I6203 Nontraumatic chronic subdural hemorrhage: Secondary | ICD-10-CM

## 2022-10-20 DIAGNOSIS — E079 Disorder of thyroid, unspecified: Secondary | ICD-10-CM

## 2022-10-20 MED ORDER — LEVETIRACETAM 1,000 MG PO TAB
1000 mg | ORAL_TABLET | Freq: Two times a day (BID) | ORAL | 0 refills | 90.00000 days | Status: AC
Start: 2022-10-20 — End: ?

## 2022-10-20 NOTE — Patient Instructions
It was a pleasure seeing you today in the Neurosurgery Clinic. Dr. Vonita Moss would like to see you back in clinic in 1 month with a CT Head prior. Our scheduling team should contact you to schedule your appointments.     If you have any questions or concerns, please don't hesitate to call me at [phone] or send a message via MyChart.  Thank you and take care!     Kennith Center, RN, BSN  Interim Clinical Nurse Coordinator for Dr. Consuela Mimes   [facility]  Phone: [phone] - Fax: [phone]

## 2022-10-20 NOTE — Progress Notes
Date of Service: 10/20/2022    Subjective:             Brendan Acosta is a 89 y.o. male who was referred for evaluation of potential left middle meningeal artery embolization for a chronic left subdural hematoma with the need for Xarelto given his DVT history and significant swelling in his right leg.    History of Present Illness    10/20/2022  Brendan Acosta, an 89 year old male patient, is recovering well after undergoing Left Middle Meningeal Artery Embolization on 09/24/2022. However, during hospitalization after the procedure, he exhibited altered neurological functioning, particularly in the expressivity of his speech. But fortunately, the episode did not bring about any significant physical weakness.  It was question if general irritation or potential seizure activity as he had some of these symptoms previously.  He was on Keppra and his dose of continued at 1000mg  BID.  Overall he made a good recovery and has not had any of these symptoms since discharge.  He overall feels like he is doing great.      He has noticed some potential orthostatic hypotension with dizziness when getting up from laying down too fast.  We discussed taking his time and having support when initially getting up.  We discussed no driving for 6 months without symptoms and plans to continue Keppra for the next 2 months and then discuss tapering.  CT head today shows near resolution of the left SDH with a sliver of space in the left convexity but overall greatly improved.      09/11/2022  Brendan Acosta has a history of left leg swelling and history of blood clot on that side.  He does have a history of falls.  He is on Xarelto and has active right lower extremity swelling now with a deep venous thromboemboli of the right leg.  He does have an IVC filter in place.  He was found on imaging to have a left small subdural hematoma after his most recent fall.  This was initially small and acute in nature.  Now on subsequent imaging he has more of a larger chronic subdural hematoma on the side.  Given these findings and his need for anticoagulation he was referred by my partner to discuss potential middle meningeal artery embolization.  We discussed and we will plan for a left middle meningeal artery embolization as an outpatient.  He will plan to spend 1 night in the hospital for observation after embolization if it is successful and there is no evidence of ophthalmic artery communication to the meningeal artery.  If we are unable to perform embolization he would likely go home the same day after the diagnostic procedure.       Review of Systems   Neurological:  Positive for dizziness, tremors and light-headedness.   All other systems reviewed and are negative.        Objective:          acetaminophen (TYLENOL EXTRA STRENGTH) 500 mg tablet Take one tablet by mouth every 4 hours as needed for Pain.    acetaminophen-codeine (TYLENOL-CODEINE #3) 300-30 mg tablet Take one tablet by mouth every 8 hours as needed for Pain.    atorvastatin (LIPITOR) 80 mg tablet Take one tablet by mouth daily. Follow up with PCP/Cardiology for further refills/monitoring of cholesterol  Indications: excessive fat in the blood    dextran 70-hypromellose (PF) (ARTIFICIAL TEARS (PF)) 0.1/0.3 % ophthalmic solution Apply two drops to three drops to both eyes daily as needed for  Dry Eyes.    diclofenac sodium (VOLTAREN) 1 % topical gel Apply four g topically to affected area four times daily as needed.    finasteride (PROSCAR) 5 mg tablet Take one tablet by mouth daily.    fluticasone propionate (FLONASE) 50 mcg/actuation nasal spray, suspension Apply two sprays to each nostril as directed daily as needed.    gabapentin (NEURONTIN) 300 mg capsule Take one capsule by mouth at bedtime daily.    ketoconazole (NIZORAL) 2 % topical shampoo APPLY TO SCALP AND EARS FOR 5 MINUTES IN SHOWER THEN RINSE 2 TO 3 TIMES A WEEK AS NEEDED FOR FLARES    levETIRAcetam (KEPPRA) 1,000 mg tablet Take one tablet by mouth twice daily. Indications: seizure    levothyroxine (SYNTHROID) 100 mcg tablet Take one tablet by mouth daily 30 minutes before breakfast.    other medication one Dose daily as needed. Medication Name & Strength (List each active ingredient & strength): [employer]  Dose (how much): 1 scoop  Route: by mouth  Frequency (how often): daily as needed    polyethylene glycol 3350 (MIRALAX) 17 g packet Take one packet by mouth daily.    rivaroxaban (XARELTO) 10 mg tablet Take one tablet by mouth daily with breakfast.    rOPINIRole (REQUIP) 3 mg tablet Take one tablet by mouth twice daily.    tamsulosin (FLOMAX) 0.4 mg capsule Take one capsule by mouth at bedtime daily. Do not crush, chew or open capsules. Take 30 minutes following the same meal each day.  Indications: enlarged prostate with urination problem    timolol maleate (TIMOPTIC) 0.25 % ophthalmic solution Apply one drop to both eyes twice daily.    turmeric (bulk) 100 % powd Take  by mouth daily.    ZIOPTAN (PF) 0.0015 % ophthalmic drop Apply one drop to both eyes daily.     Vitals:    10/20/22 1528 10/20/22 1529   BP: 119/67 104/58   BP Source: Arm, Left Upper Arm, Right Upper   Pulse: 69 70   Temp: Comment: No fever chills or cough in the last week Comment: No fever chills or cough in the last week   SpO2: 98% 98%   PainSc: Zero Zero   Weight: 74.8 kg (165 lb)    Height: 179.1 cm (5' 10.5)        Body mass index is 23.34 kg/m?Marland Kitchen     Physical Exam  Awake, Alert, Oriented  Speech normal  Cranial Nerves intact  Strength equal throughout  Gait without imbalance - without walker      Encounter Diagnoses   Name Primary?    SDH (subdural hematoma) (HCC) Yes              Imaging:  08/01/2022 CT head      09/10/2022 CT head      09/24/2022 Angiogram and Lt MMA embo  Lt MMA    Post embolization      09/24/2022 CT head      10/20/2022 CT head      Assessment and Plan:  Brendan Acosta is a 89 y.o. male who has been followed for an acute left subdural hematoma following a fall.  He requires anticoagulation for symptomatic DVTs.  He has evidence of a continued chronic left subdural hematoma.  He underwent Lt MMA embolization on 09/24/2022.      Left Chronic Subdural Hematoma:    - CT head today with minimal left SDH  - Ok to continue Anticoagulation for DVT  -  CT head and follow-up in 1 month    Orthostatic Hypotension  - Therapeutic Interventions: Patient advised to stay hydrated, take plenty of liquids and to take time when getting up especially from a lying-down position.    - Diagnostic Tests: Plan for a new CT scan in a month.    - Referrals: No referrals necessary at this moment.    - Patient Education: Explained the significance of staying well-hydrated and patiently transitioning from different body positions to manage orthostatic hypotension.    - Follow-Up: Follow-up appointment in a month along with results of new CT scan    Post-Operative Seizure  - Therapeutic Interventions: Continue Keppra for two more months to prevent the risk of seizures and then will discuss taper.    - Diagnostic Tests: Close monitoring for seizures to ensure medication's effectiveness.    - Referrals: No current referrals. If seizures persist, referral to a neurologist may be required.    - Patient Education: Educated about the risk of seizures and continuation of the current medication course and no driving for 6 months without new event.    - Follow-Up: Schedule another follow-up appointment in 1 month with CT head.      Problem   Sdh (Subdural Hematoma) (Hcc)             ATTESTATION    Total time 31 minutes. Estimated counseling time 25 minutes. Counseled patient and family regarding review of images and plans for follow-up.    Staff name:  Samantha Crimes, MD Date: 10/20/2022

## 2022-10-21 ENCOUNTER — Encounter: Admit: 2022-10-21 | Discharge: 2022-10-21 | Payer: MEDICARE

## 2022-10-21 NOTE — Telephone Encounter
Per discussion during appointment, request to coordinate visit with [facility] visit that same day, to keep from repeat drives.     Spoke with Randolm Idol, CT manager, okay to be scheduled at 1:30p for CT at [facility], even though patient will be late. This RN made note on appointment that patient is approved to be late and still get scanned.     Daughter called back and stated they also have a lab appointment at 2:45p.     This RN called Dr. Sherren Mocha office at [facility]. Spoke with her nurse who estimated that the 1p appointment should take 30-80min, with potential lab work afterwards, so in total about 1 hour. She moved the lab appointment from 2:45p to 1:45p for continuity of care.     Called Gavin Pound back, and let her know about the lab adjustment from [facility], and adjusting our appointment to 3:15p to give a buffer of time for them to travel from CT to clinic.     All questions answered.

## 2022-10-27 ENCOUNTER — Encounter: Admit: 2022-10-27 | Discharge: 2022-10-27 | Payer: MEDICARE

## 2022-10-30 ENCOUNTER — Encounter: Admit: 2022-10-30 | Discharge: 2022-10-30 | Payer: MEDICARE

## 2022-11-05 ENCOUNTER — Encounter: Admit: 2022-11-05 | Discharge: 2022-11-05 | Payer: MEDICARE

## 2022-11-07 DIAGNOSIS — N390 Urinary tract infection, site not specified: Secondary | ICD-10-CM | POA: Diagnosis not present

## 2022-11-10 DIAGNOSIS — Z515 Encounter for palliative care: Secondary | ICD-10-CM | POA: Diagnosis not present

## 2022-11-10 DIAGNOSIS — G8929 Other chronic pain: Secondary | ICD-10-CM | POA: Diagnosis not present

## 2022-11-11 LAB — EVENT MONITOR

## 2022-11-20 ENCOUNTER — Encounter: Admit: 2022-11-20 | Discharge: 2022-11-20 | Payer: MEDICARE

## 2022-11-20 DIAGNOSIS — W19XXXA Unspecified fall, initial encounter: Secondary | ICD-10-CM | POA: Diagnosis not present

## 2022-11-23 ENCOUNTER — Encounter: Admit: 2022-11-23 | Discharge: 2022-11-23 | Payer: MEDICARE

## 2022-11-23 NOTE — Telephone Encounter
Received voicemail from patient's daughter stating he has an upcoming appointment and scan on 11/24/22. On voicemail she is requesting if a separately ordered neck scan can be done as requested by [facility] at [facility].     Patient's daughter leaves second voicemail shortly after stating he actually already had the scan she mentions completed and to disregard previous voicemail.     Future Appointments   Date Time Provider Department Center   11/24/2022  1:30 PM CT [place] [facility] [place]   11/24/2022  3:15 PM Samantha Crimes, MD [facility] NeuroSurg

## 2022-11-24 ENCOUNTER — Ambulatory Visit: Admit: 2022-11-24 | Discharge: 2022-11-24 | Payer: MEDICARE

## 2022-11-24 ENCOUNTER — Encounter: Admit: 2022-11-24 | Discharge: 2022-11-24 | Payer: MEDICARE

## 2022-11-24 DIAGNOSIS — E079 Disorder of thyroid, unspecified: Secondary | ICD-10-CM

## 2022-11-24 DIAGNOSIS — R569 Unspecified convulsions: Secondary | ICD-10-CM

## 2022-11-24 DIAGNOSIS — I2510 Atherosclerotic heart disease of native coronary artery without angina pectoris: Secondary | ICD-10-CM

## 2022-11-24 DIAGNOSIS — S065XAA SDH (subdural hematoma) (HCC): Secondary | ICD-10-CM

## 2022-11-24 DIAGNOSIS — K219 Gastro-esophageal reflux disease without esophagitis: Secondary | ICD-10-CM

## 2022-11-24 DIAGNOSIS — K450 Other specified abdominal hernia with obstruction, without gangrene: Secondary | ICD-10-CM

## 2022-11-24 DIAGNOSIS — H269 Unspecified cataract: Secondary | ICD-10-CM

## 2022-11-24 DIAGNOSIS — C801 Malignant (primary) neoplasm, unspecified: Secondary | ICD-10-CM

## 2022-11-24 DIAGNOSIS — I6203 Nontraumatic chronic subdural hemorrhage: Secondary | ICD-10-CM

## 2022-11-24 DIAGNOSIS — I77810 Thoracic aortic ectasia: Secondary | ICD-10-CM

## 2022-11-24 DIAGNOSIS — E7800 Pure hypercholesterolemia, unspecified: Secondary | ICD-10-CM

## 2022-11-24 DIAGNOSIS — I82409 Acute embolism and thrombosis of unspecified deep veins of unspecified lower extremity: Secondary | ICD-10-CM

## 2022-11-24 NOTE — Patient Instructions
 It was a pleasure seeing you today in the Neurosurgery Clinic. Dr. Vonita Moss would like to see you back in clinic in _____ with a _______ prior. Our scheduling team should contact you to schedule your appointments.     If you have any questions or concerns, please don't hesitate to call me at [phone] or send a message via MyChart.  Thank you and take care!     Rayfield Citizen, RN, BSN  Clinical Nurse Coordinator for Dr. Consuela Mimes   [facility]  Phone: [phone] - Fax: [phone]

## 2022-12-12 ENCOUNTER — Encounter: Admit: 2022-12-12 | Discharge: 2022-12-12 | Payer: MEDICARE

## 2022-12-15 ENCOUNTER — Encounter: Admit: 2022-12-15 | Discharge: 2022-12-15 | Payer: MEDICARE

## 2022-12-16 ENCOUNTER — Encounter: Admit: 2022-12-16 | Discharge: 2022-12-16 | Payer: MEDICARE

## 2022-12-18 DEATH — deceased

## 2022-12-24 ENCOUNTER — Encounter: Admit: 2022-12-24 | Discharge: 2022-12-24 | Payer: MEDICARE

## 2022-12-29 ENCOUNTER — Encounter: Admit: 2022-12-29 | Discharge: 2022-12-29 | Payer: MEDICARE

## 2023-01-24 ENCOUNTER — Encounter: Admit: 2023-01-24 | Discharge: 2023-01-24 | Payer: MEDICARE

## 2023-01-25 ENCOUNTER — Encounter: Admit: 2023-01-25 | Discharge: 2023-01-25 | Payer: MEDICARE

## 2023-01-25 NOTE — Telephone Encounter
Received voicemail from patient's daughter. She states patient's MyChart account has instructions for XR Flex/Ex. Requesting clarification.     This RN reviewed patient's chart. After a fall in June, XR Flex/Ex was ordered as w/u when following with Dr. Hyacinth Meeker. Not needed at this time.    This RN called patient's daughter, verified patient name/DOB. Informed patient's daughter of above and asked if patient was having any neck pain which patient's daughter denied. Instructed her that XR was not needed at this time. Appt req cancelled by this RN.     Confirmed upcoming imaging and clinic follow up visit with Dr. Vonita Moss. Denied additional questions/concerns.

## 2023-02-20 ENCOUNTER — Encounter: Admit: 2023-02-20 | Discharge: 2023-02-20 | Payer: MEDICARE

## 2023-02-22 ENCOUNTER — Encounter: Admit: 2023-02-22 | Discharge: 2023-02-22 | Payer: MEDICARE

## 2023-02-23 ENCOUNTER — Encounter: Admit: 2023-02-23 | Discharge: 2023-02-23 | Payer: MEDICARE

## 2023-03-05 ENCOUNTER — Encounter: Admit: 2023-03-05 | Discharge: 2023-03-05 | Payer: MEDICARE

## 2023-03-14 ENCOUNTER — Encounter: Admit: 2023-03-14 | Discharge: 2023-03-14 | Payer: MEDICARE

## 2023-03-17 ENCOUNTER — Encounter: Admit: 2023-03-17 | Discharge: 2023-03-17 | Payer: MEDICARE

## 2023-03-19 ENCOUNTER — Encounter: Admit: 2023-03-19 | Discharge: 2023-03-19 | Payer: MEDICARE

## 2023-03-19 ENCOUNTER — Ambulatory Visit: Admit: 2023-03-19 | Discharge: 2023-03-19 | Payer: MEDICARE

## 2023-03-25 ENCOUNTER — Encounter: Admit: 2023-03-25 | Discharge: 2023-03-25 | Payer: MEDICARE

## 2023-04-20 ENCOUNTER — Encounter: Admit: 2023-04-20 | Discharge: 2023-04-20 | Payer: MEDICARE

## 2023-05-03 ENCOUNTER — Encounter: Admit: 2023-05-03 | Discharge: 2023-05-03 | Payer: MEDICARE

## 2023-05-03 DIAGNOSIS — S065XAA SDH (subdural hematoma) (CMS-HCC): Secondary | ICD-10-CM

## 2023-05-03 MED ORDER — LEVETIRACETAM 1,000 MG PO TAB
1000 mg | ORAL_TABLET | Freq: Two times a day (BID) | ORAL | 1 refills | 90.00000 days | Status: AC
Start: 2023-05-03 — End: ?

## 2023-06-17 ENCOUNTER — Encounter: Admit: 2023-06-17 | Discharge: 2023-06-17 | Payer: MEDICARE

## 2023-07-09 ENCOUNTER — Encounter: Admit: 2023-07-09 | Discharge: 2023-07-09 | Payer: MEDICARE

## 2023-09-10 ENCOUNTER — Encounter: Admit: 2023-09-10 | Discharge: 2023-09-10 | Payer: MEDICARE

## 2023-09-17 ENCOUNTER — Encounter: Admit: 2023-09-17 | Discharge: 2023-09-17 | Payer: MEDICARE

## 2023-09-17 ENCOUNTER — Ambulatory Visit: Admit: 2023-09-17 | Discharge: 2023-09-18 | Payer: MEDICARE

## 2023-09-17 DIAGNOSIS — S065XAA SDH (subdural hematoma) (CMS-HCC): Principal | ICD-10-CM

## 2023-09-17 MED ORDER — LEVETIRACETAM 1,000 MG PO TAB
500 mg | ORAL_TABLET | Freq: Two times a day (BID) | ORAL | 0 refills | 90.00000 days | Status: AC
Start: 2023-09-17 — End: ?

## 2023-09-17 NOTE — Patient Instructions
 It was a pleasure seeing you today in the [facility]. Dr. Vonita Moss would like to see you back in clinic on an as needed basis. If you have any questions or concerns, please don't hesitate to call me at [phone] or send a message via MyChart.    Thank you and take care!     Rayfield Citizen, RN, BSN  Clinical Nurse Coordinator for Dr. Consuela Mimes   [facility]  Phone: [phone] - Fax: [phone]

## 2023-09-17 NOTE — Progress Notes
 Date of Service: 09/17/2023    Subjective:             Brendan Acosta is a 89 y.o. male who was referred for evaluation of potential left middle meningeal artery embolization for a chronic left subdural hematoma with the need for Xarelto  given his DVT history and significant swelling in his right leg.    History of Present Illness    09/17/23  Brendan Acosta is a 89 year old male with a history of left-sided chronic subdural hematoma. He underwent middle meningeal artery embolization on 09/24/2022. Initially presenting approximately six months ago with concerns of potential seizure, he has been on Keppra  500mg  BID since then with no further seizure episodes. Brendan Acosta reports feeling more sleepy on Keppra  and experiencing other symptoms. He has resumed driving and is interested in discontinuing Keppra . The patient's last CT scan showed resolution of the subdural hematoma.    03/19/23  Brendan Acosta is an 89 year old male with a history of left subdural hematoma. He underwent middle meningeal artery embolization on 09-24-2022. He is following up today with a new CT scan of his head, which shows complete resolution of the subdural hematoma. There was a questionable seizure at the time of his original presentation, for which he has been on Keppra  since then. He has been seizure-free for approximately six months and is approaching the point where he will be legally able to drive again. The purpose of today's visit is to discuss whether to continue or taper the Keppra  medication.    11/24/2022  Brendan Acosta is an 89 year old male patient who we are following for a left chronic subdural hematoma. He has undergone a left middle meningeal artery embolization procedure. During the current visit, a new CT scan of his head was reviewed, which showed minimal, if any, residual left subdural hematoma, measuring approximately 3 millimeters in a single spot. This is a significant improvement compared to his previous CT scan in September, which already showed improvements in the left subdural hematoma. The provider notes that at this point, there is minimal residual hematoma, and the plan is to increase the time for the next follow-up at 3 months.  With less compression and no other events concerning for potential seizure will plan to start tapering his keppra  at that time - then potentially able to drive in February which will be 6 months from the event around the time of treatment.    10/20/2022  Brendan Acosta, an 89 year old male patient, is recovering well after undergoing Left Middle Meningeal Artery Embolization on 09/24/2022. However, during hospitalization after the procedure, he exhibited altered neurological functioning, particularly in the expressivity of his speech. But fortunately, the episode did not bring about any significant physical weakness.  It was question if general irritation or potential seizure activity as he had some of these symptoms previously.  He was on Keppra  and his dose of continued at 1000mg  BID.  Overall he made a good recovery and has not had any of these symptoms since discharge.  He overall feels like he is doing great.      He has noticed some potential orthostatic hypotension with dizziness when getting up from laying down too fast.  We discussed taking his time and having support when initially getting up.  We discussed no driving for 6 months without symptoms and plans to continue Keppra  for the next 2 months and then discuss tapering.  CT head today shows near resolution of the left SDH with a sliver of space in the left convexity but  overall greatly improved.      09/11/2022  Brendan Acosta has a history of left leg swelling and history of blood clot on that side.  He does have a history of falls.  He is on Xarelto  and has active right lower extremity swelling now with a deep venous thromboemboli of the right leg.  He does have an IVC filter in place.  He was found on imaging to have a left small subdural hematoma after his most recent fall.  This was initially small and acute in nature.  Now on subsequent imaging he has more of a larger chronic subdural hematoma on the side.  Given these findings and his need for anticoagulation he was referred by my partner to discuss potential middle meningeal artery embolization.  We discussed and we will plan for a left middle meningeal artery embolization as an outpatient.  He will plan to spend 1 night in the hospital for observation after embolization if it is successful and there is no evidence of ophthalmic artery communication to the meningeal artery.  If we are unable to perform embolization he would likely go home the same day after the diagnostic procedure.       Review of Systems   All other systems reviewed and are negative.        Objective:          acetaminophen  (TYLENOL  EXTRA STRENGTH) 500 mg tablet Take one tablet by mouth every 4 hours as needed for Pain.    acetaminophen -codeine (TYLENOL -CODEINE #3) 300-30 mg tablet Take one tablet by mouth every 8 hours as needed for Pain.    atorvastatin  (LIPITOR) 80 mg tablet Take one tablet by mouth daily. Follow up with PCP/Cardiology for further refills/monitoring of cholesterol  Indications: excessive fat in the blood    dextran 70-hypromellose (PF) (ARTIFICIAL TEARS (PF)) 0.1/0.3 % ophthalmic solution Apply two drops to three drops to both eyes daily as needed for Dry Eyes.    diclofenac sodium (VOLTAREN) 1 % topical gel Apply four g topically to affected area four times daily as needed.    finasteride  (PROSCAR ) 5 mg tablet Take one tablet by mouth daily.    fluticasone propionate (FLONASE) 50 mcg/actuation nasal spray, suspension Apply two sprays to each nostril as directed daily as needed.    gabapentin  (NEURONTIN ) 300 mg capsule Take one capsule by mouth at bedtime daily.    ketoconazole (NIZORAL) 2 % topical shampoo APPLY TO SCALP AND EARS FOR 5 MINUTES IN SHOWER THEN RINSE 2 TO 3 TIMES A WEEK AS NEEDED FOR FLARES    levETIRAcetam  (KEPPRA ) 1,000 mg tablet Take one-half tablet by mouth twice daily for 7 days. 500mg  one daily for 1 week, then stop.  Indications: seizure    levothyroxine  (SYNTHROID ) 100 mcg tablet Take one tablet by mouth daily 30 minutes before breakfast.    other medication one Dose daily as needed. Medication Name & Strength (List each active ingredient & strength): [employer]  Dose (how much): 1 scoop  Route: by mouth  Frequency (how often): daily as needed    polyethylene glycol 3350  (MIRALAX ) 17 g packet Take one packet by mouth daily.    rivaroxaban  (XARELTO ) 15 mg tablet Take one tablet by mouth daily with breakfast.    rOPINIRole  (REQUIP ) 3 mg tablet Take one tablet by mouth twice daily.    tamsulosin  (FLOMAX ) 0.4 mg capsule Take one capsule by mouth at bedtime daily. Do not crush, chew or open capsules. Take 30 minutes following the  same meal each day.  Indications: enlarged prostate with urination problem    timolol  maleate (TIMOPTIC ) 0.25 % ophthalmic solution Apply one drop to both eyes twice daily.    turmeric (bulk) 100 % powd Take  by mouth daily.    ZIOPTAN (PF) 0.0015 % ophthalmic drop Apply one drop to both eyes daily.     Vitals:    09/17/23 1059   BP: 110/63   BP Source: Arm, Left Upper   Pulse: 55   Temp: Comment: No fever chills or cough in the last week   SpO2: 97%   PainSc: Five   Weight: 83.9 kg (185 lb)           Body mass index is 26.54 kg/m?SABRA     Physical Exam  Awake, Alert, Oriented  Speech normal  Cranial Nerves intact  Strength equal throughout  Gait without imbalance - has his cane  Bilateral leg swelling improved      Encounter Diagnoses   Name Primary?    SDH (subdural hematoma) (CMS-HCC)               Imaging:  08/01/2022 CT head      09/10/2022 CT head      09/24/2022 Angiogram and Lt MMA embo  Lt MMA    Post embolization      09/24/2022 CT head      10/20/2022 CT head      11/24/2022      03/19/23 CT head      Assessment and Plan:  Brendan Acosta is a 89 y.o. male who has been followed for an acute left subdural hematoma following a fall.  He requires anticoagulation for symptomatic DVTs.  He has evidence of a continued chronic left subdural hematoma.  He underwent Lt MMA embolization on 09/24/2022.    Chronic Subdural Hematoma - Resolved: Patient's last CT scan showed resolution of the left-sided chronic subdural hematoma. No new neurological symptoms reported.    - No further imaging required at this time    - Continue monitoring for any new neurological symptoms    - Patient education provided on signs and symptoms to watch for    Seizure Prophylaxis - Tapering: Patient has been seizure-free on Keppra  500mg  BID and wishes to discontinue due to side effects.    - Taper Keppra : Reduce to 500mg  once daily for one week, then discontinue    - Patient education on potential withdrawal symptoms and when to seek medical attention    - Follow-up: PRN basis, patient to call if any issues arise during or after tapering    - Monitor for improvements in sleepiness and other reported symptoms after discontinuation    Dizziness - Possibly Medication-Related: Patient reports dizziness, which may be related to Keppra  use.    - Monitor dizziness during Keppra  tapering    - Patient to report if dizziness worsens or persists after Keppra  discontinuation    - Consider further evaluation if symptoms persist    Cardiovascular Health: Patient reports low blood pressure, previously discontinued statin medication.    - Continue monitoring blood pressure    - Agreed with previous decision to discontinue statin given patient's age and risk-benefit profile    - Encourage healthy lifestyle measures for cardiovascular health    Balance Issues: Patient reports difficulty with activities like bowling due to balance problems.    - Monitor for improvement in balance after Keppra  discontinuation    - Consider referral for physical therapy or balance assessment if  issues persist    - Advise on safety measures to prevent falls                   ATTESTATION    Total time 18 minutes. Estimated counseling time 15 minutes. Counseled patient and family regarding review of plan to taper off of Keppra .    Staff name:  Venetia JAYSON Endo, MD Date: 09/17/2023

## 2023-09-20 ENCOUNTER — Encounter: Admit: 2023-09-20 | Discharge: 2023-09-20 | Payer: MEDICARE
